# Patient Record
Sex: Female | Born: 1940
Health system: Southern US, Community
[De-identification: ages and names within clinical notes are randomized; demographics above are authoritative.]

## PROBLEM LIST (undated history)

## (undated) DIAGNOSIS — Z9889 Other specified postprocedural states: Secondary | ICD-10-CM

## (undated) DIAGNOSIS — K219 Gastro-esophageal reflux disease without esophagitis: Secondary | ICD-10-CM

## (undated) DIAGNOSIS — I5030 Unspecified diastolic (congestive) heart failure: Secondary | ICD-10-CM

## (undated) DIAGNOSIS — I639 Cerebral infarction, unspecified: Secondary | ICD-10-CM

## (undated) DIAGNOSIS — R1310 Dysphagia, unspecified: Secondary | ICD-10-CM

## (undated) DIAGNOSIS — R519 Headache, unspecified: Secondary | ICD-10-CM

## (undated) DIAGNOSIS — G8929 Other chronic pain: Secondary | ICD-10-CM

## (undated) DIAGNOSIS — Z91041 Radiographic dye allergy status: Secondary | ICD-10-CM

## (undated) DIAGNOSIS — I2699 Other pulmonary embolism without acute cor pulmonale: Secondary | ICD-10-CM

## (undated) DIAGNOSIS — K449 Diaphragmatic hernia without obstruction or gangrene: Secondary | ICD-10-CM

## (undated) DIAGNOSIS — R51 Headache: Secondary | ICD-10-CM

## (undated) DIAGNOSIS — I509 Heart failure, unspecified: Secondary | ICD-10-CM

## (undated) DIAGNOSIS — I2510 Atherosclerotic heart disease of native coronary artery without angina pectoris: Secondary | ICD-10-CM

## (undated) DIAGNOSIS — I350 Nonrheumatic aortic (valve) stenosis: Secondary | ICD-10-CM

## (undated) DIAGNOSIS — Z8639 Personal history of other endocrine, nutritional and metabolic disease: Secondary | ICD-10-CM

## (undated) DIAGNOSIS — R112 Nausea with vomiting, unspecified: Secondary | ICD-10-CM

## (undated) DIAGNOSIS — I10 Essential (primary) hypertension: Secondary | ICD-10-CM

## (undated) DIAGNOSIS — A048 Other specified bacterial intestinal infections: Secondary | ICD-10-CM

## (undated) DIAGNOSIS — I480 Paroxysmal atrial fibrillation: Secondary | ICD-10-CM

## (undated) DIAGNOSIS — D649 Anemia, unspecified: Secondary | ICD-10-CM

## (undated) DIAGNOSIS — M1990 Unspecified osteoarthritis, unspecified site: Secondary | ICD-10-CM

## (undated) DIAGNOSIS — R209 Unspecified disturbances of skin sensation: Secondary | ICD-10-CM

## (undated) DIAGNOSIS — K5790 Diverticulosis of intestine, part unspecified, without perforation or abscess without bleeding: Secondary | ICD-10-CM

## (undated) DIAGNOSIS — R202 Paresthesia of skin: Secondary | ICD-10-CM

## (undated) DIAGNOSIS — C159 Malignant neoplasm of esophagus, unspecified: Secondary | ICD-10-CM

## (undated) DIAGNOSIS — J90 Pleural effusion, not elsewhere classified: Secondary | ICD-10-CM

## (undated) DIAGNOSIS — IMO0002 Reserved for concepts with insufficient information to code with codable children: Secondary | ICD-10-CM

## (undated) HISTORY — DX: Pleural effusion, not elsewhere classified: J90

## (undated) HISTORY — PX: CHOLECYSTECTOMY: SHX55

## (undated) HISTORY — PX: BACK SURGERY: SHX140

## (undated) HISTORY — DX: Dysphagia, unspecified: R13.10

## (undated) HISTORY — DX: Other specified bacterial intestinal infections: A04.8

## (undated) HISTORY — PX: TONSILLECTOMY AND ADENOIDECTOMY: SHX28

## (undated) HISTORY — PX: APPENDECTOMY: SHX54

## (undated) HISTORY — DX: Diverticulosis of intestine, part unspecified, without perforation or abscess without bleeding: K57.90

## (undated) HISTORY — PX: OTHER SURGICAL HISTORY: SHX169

## (undated) HISTORY — PX: CARPAL TUNNEL RELEASE: SHX101

## (undated) HISTORY — PX: TONSILLECTOMY: SUR1361

---

## 1980-07-03 DIAGNOSIS — I639 Cerebral infarction, unspecified: Secondary | ICD-10-CM

## 1980-07-03 HISTORY — DX: Cerebral infarction, unspecified: I63.9

## 1989-07-03 HISTORY — PX: LUMBAR DISC SURGERY: SHX700

## 1998-07-30 ENCOUNTER — Other Ambulatory Visit: Admission: RE | Admit: 1998-07-30 | Discharge: 1998-07-30 | Payer: Self-pay | Admitting: Obstetrics and Gynecology

## 1998-10-12 ENCOUNTER — Encounter: Payer: Self-pay | Admitting: Family Medicine

## 1998-10-12 ENCOUNTER — Ambulatory Visit (HOSPITAL_COMMUNITY): Admission: RE | Admit: 1998-10-12 | Discharge: 1998-10-12 | Payer: Self-pay | Admitting: Family Medicine

## 1999-11-04 ENCOUNTER — Emergency Department (HOSPITAL_COMMUNITY): Admission: EM | Admit: 1999-11-04 | Discharge: 1999-11-04 | Payer: Self-pay | Admitting: Emergency Medicine

## 2000-08-28 ENCOUNTER — Other Ambulatory Visit: Admission: RE | Admit: 2000-08-28 | Discharge: 2000-08-28 | Payer: Self-pay | Admitting: Family Medicine

## 2000-10-12 ENCOUNTER — Ambulatory Visit (HOSPITAL_COMMUNITY): Admission: RE | Admit: 2000-10-12 | Discharge: 2000-10-12 | Payer: Self-pay | Admitting: Family Medicine

## 2000-10-12 ENCOUNTER — Encounter: Payer: Self-pay | Admitting: Family Medicine

## 2001-09-19 ENCOUNTER — Encounter: Payer: Self-pay | Admitting: Family Medicine

## 2001-09-19 ENCOUNTER — Ambulatory Visit (HOSPITAL_COMMUNITY): Admission: RE | Admit: 2001-09-19 | Discharge: 2001-09-19 | Payer: Self-pay | Admitting: Family Medicine

## 2002-07-29 ENCOUNTER — Ambulatory Visit (HOSPITAL_COMMUNITY): Admission: RE | Admit: 2002-07-29 | Discharge: 2002-07-29 | Payer: Self-pay | Admitting: Family Medicine

## 2002-07-29 ENCOUNTER — Encounter: Payer: Self-pay | Admitting: Family Medicine

## 2002-08-06 ENCOUNTER — Ambulatory Visit (HOSPITAL_COMMUNITY): Admission: RE | Admit: 2002-08-06 | Discharge: 2002-08-06 | Payer: Self-pay | Admitting: Family Medicine

## 2002-08-06 ENCOUNTER — Encounter: Payer: Self-pay | Admitting: Family Medicine

## 2002-08-13 ENCOUNTER — Inpatient Hospital Stay (HOSPITAL_COMMUNITY): Admission: AD | Admit: 2002-08-13 | Discharge: 2002-08-20 | Payer: Self-pay | Admitting: Internal Medicine

## 2003-06-08 ENCOUNTER — Other Ambulatory Visit: Admission: RE | Admit: 2003-06-08 | Discharge: 2003-06-08 | Payer: Self-pay | Admitting: Family Medicine

## 2003-07-04 DIAGNOSIS — D649 Anemia, unspecified: Secondary | ICD-10-CM

## 2003-07-04 HISTORY — DX: Anemia, unspecified: D64.9

## 2003-10-17 ENCOUNTER — Inpatient Hospital Stay (HOSPITAL_COMMUNITY): Admission: EM | Admit: 2003-10-17 | Discharge: 2003-10-21 | Payer: Self-pay | Admitting: Emergency Medicine

## 2004-09-12 ENCOUNTER — Inpatient Hospital Stay (HOSPITAL_COMMUNITY): Admission: EM | Admit: 2004-09-12 | Discharge: 2004-09-14 | Payer: Self-pay | Admitting: Internal Medicine

## 2004-11-18 ENCOUNTER — Ambulatory Visit: Admission: RE | Admit: 2004-11-18 | Discharge: 2004-11-18 | Payer: Self-pay | Admitting: Family Medicine

## 2005-05-27 ENCOUNTER — Encounter: Admission: RE | Admit: 2005-05-27 | Discharge: 2005-05-27 | Payer: Self-pay | Admitting: Family Medicine

## 2005-11-29 ENCOUNTER — Inpatient Hospital Stay (HOSPITAL_COMMUNITY): Admission: EM | Admit: 2005-11-29 | Discharge: 2005-12-01 | Payer: Self-pay | Admitting: Emergency Medicine

## 2005-12-01 ENCOUNTER — Encounter: Payer: Self-pay | Admitting: *Deleted

## 2006-07-03 DIAGNOSIS — K449 Diaphragmatic hernia without obstruction or gangrene: Secondary | ICD-10-CM

## 2006-07-03 HISTORY — DX: Diaphragmatic hernia without obstruction or gangrene: K44.9

## 2006-10-02 ENCOUNTER — Emergency Department (HOSPITAL_COMMUNITY): Admission: EM | Admit: 2006-10-02 | Discharge: 2006-10-02 | Payer: Self-pay | Admitting: Emergency Medicine

## 2006-11-01 ENCOUNTER — Other Ambulatory Visit: Admission: RE | Admit: 2006-11-01 | Discharge: 2006-11-01 | Payer: Self-pay | Admitting: Family Medicine

## 2006-11-30 ENCOUNTER — Inpatient Hospital Stay (HOSPITAL_COMMUNITY): Admission: EM | Admit: 2006-11-30 | Discharge: 2006-12-06 | Payer: Self-pay | Admitting: Emergency Medicine

## 2007-09-21 ENCOUNTER — Emergency Department (HOSPITAL_COMMUNITY): Admission: EM | Admit: 2007-09-21 | Discharge: 2007-09-21 | Payer: Self-pay | Admitting: Emergency Medicine

## 2009-10-12 ENCOUNTER — Encounter (INDEPENDENT_AMBULATORY_CARE_PROVIDER_SITE_OTHER): Payer: Self-pay | Admitting: Internal Medicine

## 2010-05-13 ENCOUNTER — Ambulatory Visit: Payer: Self-pay | Admitting: Pulmonary Disease

## 2010-05-13 ENCOUNTER — Encounter: Admission: RE | Admit: 2010-05-13 | Discharge: 2010-05-13 | Payer: Self-pay | Admitting: Family Medicine

## 2010-05-14 ENCOUNTER — Inpatient Hospital Stay (HOSPITAL_COMMUNITY): Admission: RE | Admit: 2010-05-14 | Discharge: 2010-05-18 | Payer: Self-pay | Admitting: Family Medicine

## 2010-05-16 ENCOUNTER — Encounter (INDEPENDENT_AMBULATORY_CARE_PROVIDER_SITE_OTHER): Payer: Self-pay | Admitting: Internal Medicine

## 2010-05-17 ENCOUNTER — Encounter (INDEPENDENT_AMBULATORY_CARE_PROVIDER_SITE_OTHER): Payer: Self-pay | Admitting: Cardiology

## 2010-06-09 ENCOUNTER — Inpatient Hospital Stay (HOSPITAL_COMMUNITY): Admission: EM | Admit: 2010-06-09 | Discharge: 2009-10-16 | Payer: Self-pay | Admitting: Emergency Medicine

## 2010-07-03 HISTORY — PX: OTHER SURGICAL HISTORY: SHX169

## 2010-07-23 ENCOUNTER — Encounter: Payer: Self-pay | Admitting: Family Medicine

## 2010-09-12 ENCOUNTER — Other Ambulatory Visit: Payer: Self-pay | Admitting: Family Medicine

## 2010-09-12 ENCOUNTER — Ambulatory Visit
Admission: RE | Admit: 2010-09-12 | Discharge: 2010-09-12 | Disposition: A | Discharge status: Home / Self Care | Payer: Medicare Other | Source: Ambulatory Visit | Attending: Family Medicine | Admitting: Family Medicine

## 2010-09-12 DIAGNOSIS — M545 Low back pain, unspecified: Secondary | ICD-10-CM

## 2010-09-12 DIAGNOSIS — M79604 Pain in right leg: Secondary | ICD-10-CM

## 2010-09-13 LAB — CBC
HCT: 22.6 % — ABNORMAL LOW (ref 36.0–46.0)
HCT: 24.6 % — ABNORMAL LOW (ref 36.0–46.0)
HCT: 31.2 % — ABNORMAL LOW (ref 36.0–46.0)
Hemoglobin: 10.2 g/dL — ABNORMAL LOW (ref 12.0–15.0)
Hemoglobin: 7.4 g/dL — ABNORMAL LOW (ref 12.0–15.0)
Hemoglobin: 8.1 g/dL — ABNORMAL LOW (ref 12.0–15.0)
Hemoglobin: 8.5 g/dL — ABNORMAL LOW (ref 12.0–15.0)
Hemoglobin: 8.6 g/dL — ABNORMAL LOW (ref 12.0–15.0)
MCH: 25.2 pg — ABNORMAL LOW (ref 26.0–34.0)
MCH: 25.3 pg — ABNORMAL LOW (ref 26.0–34.0)
MCH: 25.4 pg — ABNORMAL LOW (ref 26.0–34.0)
MCH: 25.6 pg — ABNORMAL LOW (ref 26.0–34.0)
MCHC: 32.6 g/dL (ref 30.0–36.0)
MCHC: 32.8 g/dL (ref 30.0–36.0)
MCHC: 32.8 g/dL (ref 30.0–36.0)
MCHC: 33.2 g/dL (ref 30.0–36.0)
MCV: 76.2 fL — ABNORMAL LOW (ref 78.0–100.0)
Platelets: 166 10*3/uL (ref 150–400)
Platelets: 174 10*3/uL (ref 150–400)
RBC: 3.06 MIL/uL — ABNORMAL LOW (ref 3.87–5.11)
RBC: 3.21 MIL/uL — ABNORMAL LOW (ref 3.87–5.11)
RBC: 4 MIL/uL (ref 3.87–5.11)
RDW: 16.5 % — ABNORMAL HIGH (ref 11.5–15.5)
WBC: 4.2 10*3/uL (ref 4.0–10.5)
WBC: 8.5 10*3/uL (ref 4.0–10.5)

## 2010-09-13 LAB — BASIC METABOLIC PANEL
BUN: 12 mg/dL (ref 6–23)
CO2: 26 mEq/L (ref 19–32)
CO2: 30 mEq/L (ref 19–32)
Calcium: 8.2 mg/dL — ABNORMAL LOW (ref 8.4–10.5)
Calcium: 8.3 mg/dL — ABNORMAL LOW (ref 8.4–10.5)
Calcium: 8.7 mg/dL (ref 8.4–10.5)
Calcium: 8.9 mg/dL (ref 8.4–10.5)
Calcium: 9.1 mg/dL (ref 8.4–10.5)
Chloride: 107 mEq/L (ref 96–112)
Creatinine, Ser: 0.96 mg/dL (ref 0.4–1.2)
Creatinine, Ser: 1.04 mg/dL (ref 0.4–1.2)
Creatinine, Ser: 1.1 mg/dL (ref 0.4–1.2)
GFR calc Af Amer: 45 mL/min — ABNORMAL LOW (ref >60)
GFR calc Af Amer: 59 mL/min — ABNORMAL LOW (ref >60)
GFR calc Af Amer: >60 mL/min (ref >60)
GFR calc non Af Amer: 49 mL/min — ABNORMAL LOW (ref >60)
GFR calc non Af Amer: 52 mL/min — ABNORMAL LOW (ref >60)
GFR calc non Af Amer: 59 mL/min — ABNORMAL LOW (ref >60)
Glucose, Bld: 108 mg/dL — ABNORMAL HIGH (ref 70–99)
Glucose, Bld: 140 mg/dL — ABNORMAL HIGH (ref 70–99)
Glucose, Bld: 151 mg/dL — ABNORMAL HIGH (ref 70–99)
Glucose, Bld: 171 mg/dL — ABNORMAL HIGH (ref 70–99)
Potassium: 3.6 mEq/L (ref 3.5–5.1)
Sodium: 140 mEq/L (ref 135–145)
Sodium: 141 mEq/L (ref 135–145)
Sodium: 142 mEq/L (ref 135–145)

## 2010-09-13 LAB — D-DIMER, QUANTITATIVE
D-Dimer, Quant: 0.24 ug/mL-FEU (ref 0.00–0.48)
D-Dimer, Quant: 0.32 ug/mL-FEU (ref 0.00–0.48)

## 2010-09-13 LAB — CULTURE, BLOOD (ROUTINE X 2)
Culture  Setup Time: 201111142310
Culture: NO GROWTH

## 2010-09-13 LAB — BLOOD GAS, ARTERIAL
O2 Content: 2 L/min
O2 Saturation: 99.4 %
Patient temperature: 98.6
pH, Arterial: 7.416 — ABNORMAL HIGH (ref 7.350–7.400)
pO2, Arterial: 140 mmHg — ABNORMAL HIGH (ref 80.0–100.0)

## 2010-09-13 LAB — CARDIAC PANEL(CRET KIN+CKTOT+MB+TROPI)
CK, MB: 1.6 ng/mL (ref 0.3–4.0)
CK, MB: 1.7 ng/mL (ref 0.3–4.0)
Relative Index: INVALID (ref 0.0–2.5)
Total CK: 42 U/L (ref 7–177)
Total CK: 71 U/L (ref 7–177)
Troponin I: 0.01 ng/mL (ref 0.00–0.06)
Troponin I: 0.02 ng/mL (ref 0.00–0.06)
Troponin I: 0.02 ng/mL (ref 0.00–0.06)

## 2010-09-13 LAB — POCT CARDIAC MARKERS
CKMB, poc: <1 ng/mL — ABNORMAL LOW (ref 1.0–8.0)
Myoglobin, poc: 61.3 ng/mL (ref 12–200)
Troponin i, poc: <0.05 ng/mL (ref 0.00–0.09)

## 2010-09-13 LAB — CROSSMATCH
ABO/RH(D): A POS
Antibody Screen: NEGATIVE
Unit division: 0
Unit division: 0

## 2010-09-13 LAB — PROTIME-INR
INR: 2 — ABNORMAL HIGH (ref 0.00–1.49)
INR: 2.01 — ABNORMAL HIGH (ref 0.00–1.49)
Prothrombin Time: 22.8 seconds — ABNORMAL HIGH (ref 11.6–15.2)
Prothrombin Time: 22.9 seconds — ABNORMAL HIGH (ref 11.6–15.2)

## 2010-09-13 LAB — COMPREHENSIVE METABOLIC PANEL
ALT: 15 U/L (ref 0–35)
Alkaline Phosphatase: 56 U/L (ref 39–117)
Chloride: 107 mEq/L (ref 96–112)
Glucose, Bld: 122 mg/dL — ABNORMAL HIGH (ref 70–99)
Potassium: 3.8 mEq/L (ref 3.5–5.1)
Sodium: 141 mEq/L (ref 135–145)
Total Bilirubin: 0.3 mg/dL (ref 0.3–1.2)
Total Protein: 5.8 g/dL — ABNORMAL LOW (ref 6.0–8.3)

## 2010-09-13 LAB — APTT: aPTT: 33 seconds (ref 24–37)

## 2010-09-13 LAB — URINE CULTURE
Colony Count: 4000
Culture  Setup Time: 201111121133

## 2010-09-13 LAB — DIFFERENTIAL
Basophils Absolute: 0 10*3/uL (ref 0.0–0.1)
Eosinophils Absolute: 0.1 10*3/uL (ref 0.0–0.7)
Eosinophils Relative: 2 % (ref 0–5)
Lymphocytes Relative: 31 % (ref 12–46)
Lymphs Abs: 1.3 10*3/uL (ref 0.7–4.0)
Neutrophils Relative %: 56 % (ref 43–77)

## 2010-09-13 LAB — HEMOCCULT GUIAC POC 1CARD (OFFICE)
Fecal Occult Bld: NEGATIVE
Fecal Occult Bld: NEGATIVE

## 2010-09-13 LAB — BRAIN NATRIURETIC PEPTIDE
Pro B Natriuretic peptide (BNP): 104 pg/mL — ABNORMAL HIGH (ref 0.0–100.0)
Pro B Natriuretic peptide (BNP): 408 pg/mL — ABNORMAL HIGH (ref 0.0–100.0)

## 2010-09-13 LAB — MAGNESIUM: Magnesium: 2.2 mg/dL (ref 1.5–2.5)

## 2010-09-13 LAB — HEMOGLOBIN AND HEMATOCRIT, BLOOD
HCT: 27 % — ABNORMAL LOW (ref 36.0–46.0)
Hemoglobin: 8.2 g/dL — ABNORMAL LOW (ref 12.0–15.0)

## 2010-09-13 LAB — URINE MICROSCOPIC-ADD ON

## 2010-09-13 LAB — TROPONIN I: Troponin I: 0.02 ng/mL (ref 0.00–0.06)

## 2010-09-13 LAB — RETICULOCYTES
RBC.: 3.52 MIL/uL — ABNORMAL LOW (ref 3.87–5.11)
Retic Count, Absolute: 73.9 10*3/uL (ref 19.0–186.0)

## 2010-09-13 LAB — ABO/RH: ABO/RH(D): A POS

## 2010-09-13 LAB — LEGIONELLA ANTIGEN, URINE: Legionella Antigen, Urine: NEGATIVE

## 2010-09-13 LAB — URINALYSIS, ROUTINE W REFLEX MICROSCOPIC
Bilirubin Urine: NEGATIVE
Ketones, ur: NEGATIVE mg/dL
Nitrite: NEGATIVE
Protein, ur: NEGATIVE mg/dL
Urobilinogen, UA: 1 mg/dL (ref 0.0–1.0)

## 2010-09-13 LAB — LIPID PANEL: HDL: 45 mg/dL (ref >39)

## 2010-09-13 LAB — CK TOTAL AND CKMB (NOT AT ARMC)
CK, MB: 1.7 ng/mL (ref 0.3–4.0)
Total CK: 76 U/L (ref 7–177)

## 2010-09-13 LAB — IRON AND TIBC: Iron: 40 ug/dL — ABNORMAL LOW (ref 42–135)

## 2010-09-13 LAB — FERRITIN: Ferritin: 31 ng/mL (ref 10–291)

## 2010-09-20 LAB — CBC
HCT: 29 % — ABNORMAL LOW (ref 36.0–46.0)
Hemoglobin: 10.2 g/dL — ABNORMAL LOW (ref 12.0–15.0)
Platelets: 128 10*3/uL — ABNORMAL LOW (ref 150–400)
RBC: 3.23 MIL/uL — ABNORMAL LOW (ref 3.87–5.11)
RDW: 13.5 % (ref 11.5–15.5)
WBC: 5.4 10*3/uL (ref 4.0–10.5)

## 2010-09-20 LAB — PROTIME-INR
INR: 2.14 — ABNORMAL HIGH (ref 0.00–1.49)
INR: 2.5 — ABNORMAL HIGH (ref 0.00–1.49)
Prothrombin Time: 23.7 seconds — ABNORMAL HIGH (ref 11.6–15.2)

## 2010-09-20 LAB — BASIC METABOLIC PANEL
CO2: 31 mEq/L (ref 19–32)
Calcium: 8.6 mg/dL (ref 8.4–10.5)
GFR calc Af Amer: >60 mL/min (ref >60)
GFR calc non Af Amer: >60 mL/min (ref >60)
Potassium: 4.1 mEq/L (ref 3.5–5.1)
Sodium: 144 mEq/L (ref 135–145)

## 2010-09-20 LAB — H. PYLORI ANTIBODY, IGG: H Pylori IgG: <0.4 {ISR}

## 2010-09-20 LAB — HEMOCCULT GUIAC POC 1CARD (OFFICE): Fecal Occult Bld: POSITIVE

## 2010-09-21 LAB — COMPREHENSIVE METABOLIC PANEL
ALT: 16 U/L (ref 0–35)
AST: 18 U/L (ref 0–37)
Alkaline Phosphatase: 53 U/L (ref 39–117)
CO2: 30 mEq/L (ref 19–32)
Calcium: 8.9 mg/dL (ref 8.4–10.5)
Chloride: 105 mEq/L (ref 96–112)
GFR calc Af Amer: >60 mL/min (ref >60)
GFR calc non Af Amer: 54 mL/min — ABNORMAL LOW (ref >60)
Potassium: 3.6 mEq/L (ref 3.5–5.1)
Sodium: 140 mEq/L (ref 135–145)
Total Bilirubin: 0.7 mg/dL (ref 0.3–1.2)

## 2010-09-21 LAB — CBC
HCT: 27.3 % — ABNORMAL LOW (ref 36.0–46.0)
HCT: 30.6 % — ABNORMAL LOW (ref 36.0–46.0)
Hemoglobin: 10.6 g/dL — ABNORMAL LOW (ref 12.0–15.0)
Hemoglobin: 12.9 g/dL (ref 12.0–15.0)
MCHC: 34.7 g/dL (ref 30.0–36.0)
MCV: 89.3 fL (ref 78.0–100.0)
RBC: 3.06 MIL/uL — ABNORMAL LOW (ref 3.87–5.11)
RBC: 3.4 MIL/uL — ABNORMAL LOW (ref 3.87–5.11)
RBC: 4.13 MIL/uL (ref 3.87–5.11)
WBC: 4.1 10*3/uL (ref 4.0–10.5)
WBC: 7.9 10*3/uL (ref 4.0–10.5)

## 2010-09-21 LAB — TROPONIN I
Troponin I: 0.04 ng/mL (ref 0.00–0.06)
Troponin I: 0.05 ng/mL (ref 0.00–0.06)

## 2010-09-21 LAB — PROTIME-INR
INR: 1.86 — ABNORMAL HIGH (ref 0.00–1.49)
Prothrombin Time: 21.3 seconds — ABNORMAL HIGH (ref 11.6–15.2)

## 2010-09-21 LAB — LIPID PANEL
Triglycerides: 92 mg/dL (ref <150)
VLDL: 18 mg/dL (ref 0–40)

## 2010-09-21 LAB — POCT CARDIAC MARKERS
CKMB, poc: 2 ng/mL (ref 1.0–8.0)
Troponin i, poc: <0.05 ng/mL (ref 0.00–0.09)
Troponin i, poc: <0.05 ng/mL (ref 0.00–0.09)

## 2010-09-21 LAB — DIFFERENTIAL
Basophils Relative: 0 % (ref 0–1)
Eosinophils Absolute: 0.3 10*3/uL (ref 0.0–0.7)
Eosinophils Relative: 4 % (ref 0–5)
Lymphs Abs: 2.6 10*3/uL (ref 0.7–4.0)

## 2010-09-21 LAB — CK TOTAL AND CKMB (NOT AT ARMC)
CK, MB: 2 ng/mL (ref 0.3–4.0)
Relative Index: INVALID (ref 0.0–2.5)
Relative Index: INVALID (ref 0.0–2.5)
Total CK: 51 U/L (ref 7–177)

## 2010-09-21 LAB — TSH: TSH: 2.971 u[IU]/mL (ref 0.350–4.500)

## 2010-10-03 ENCOUNTER — Other Ambulatory Visit: Payer: Self-pay | Admitting: Orthopedic Surgery

## 2010-10-03 DIAGNOSIS — M549 Dorsalgia, unspecified: Secondary | ICD-10-CM

## 2010-10-04 ENCOUNTER — Ambulatory Visit
Admission: RE | Admit: 2010-10-04 | Discharge: 2010-10-04 | Disposition: A | Discharge status: Home / Self Care | Payer: Medicare Other | Source: Ambulatory Visit | Attending: Orthopedic Surgery | Admitting: Orthopedic Surgery

## 2010-10-04 DIAGNOSIS — M549 Dorsalgia, unspecified: Secondary | ICD-10-CM

## 2010-10-07 ENCOUNTER — Other Ambulatory Visit (HOSPITAL_COMMUNITY): Payer: Medicare Other

## 2010-10-10 ENCOUNTER — Ambulatory Visit (HOSPITAL_COMMUNITY)
Admission: RE | Admit: 2010-10-10 | Discharge: 2010-10-10 | Disposition: A | Discharge status: Home / Self Care | Payer: Medicare Other | Source: Ambulatory Visit | Attending: Orthopedic Surgery | Admitting: Orthopedic Surgery

## 2010-10-10 ENCOUNTER — Other Ambulatory Visit (HOSPITAL_COMMUNITY): Payer: Self-pay | Admitting: Orthopedic Surgery

## 2010-10-10 ENCOUNTER — Encounter (HOSPITAL_COMMUNITY)
Admission: RE | Admit: 2010-10-10 | Discharge: 2010-10-10 | Disposition: A | Discharge status: Home / Self Care | Payer: Medicare Other | Source: Ambulatory Visit | Attending: Orthopedic Surgery | Admitting: Orthopedic Surgery

## 2010-10-10 DIAGNOSIS — Z01812 Encounter for preprocedural laboratory examination: Secondary | ICD-10-CM | POA: Insufficient documentation

## 2010-10-10 DIAGNOSIS — Z01818 Encounter for other preprocedural examination: Secondary | ICD-10-CM | POA: Insufficient documentation

## 2010-10-10 DIAGNOSIS — I10 Essential (primary) hypertension: Secondary | ICD-10-CM | POA: Insufficient documentation

## 2010-10-10 DIAGNOSIS — R059 Cough, unspecified: Secondary | ICD-10-CM | POA: Insufficient documentation

## 2010-10-10 DIAGNOSIS — M5126 Other intervertebral disc displacement, lumbar region: Secondary | ICD-10-CM | POA: Insufficient documentation

## 2010-10-10 DIAGNOSIS — M4120 Other idiopathic scoliosis, site unspecified: Secondary | ICD-10-CM | POA: Insufficient documentation

## 2010-10-10 DIAGNOSIS — R05 Cough: Secondary | ICD-10-CM | POA: Insufficient documentation

## 2010-10-10 DIAGNOSIS — K449 Diaphragmatic hernia without obstruction or gangrene: Secondary | ICD-10-CM | POA: Insufficient documentation

## 2010-10-10 DIAGNOSIS — Z01810 Encounter for preprocedural cardiovascular examination: Secondary | ICD-10-CM | POA: Insufficient documentation

## 2010-10-10 LAB — DIFFERENTIAL
Lymphocytes Relative: 28 % (ref 12–46)
Monocytes Absolute: 0.6 10*3/uL (ref 0.1–1.0)
Monocytes Relative: 9 % (ref 3–12)
Neutro Abs: 4.4 10*3/uL (ref 1.7–7.7)

## 2010-10-10 LAB — CBC
HCT: 27.9 % — ABNORMAL LOW (ref 36.0–46.0)
Hemoglobin: 8.8 g/dL — ABNORMAL LOW (ref 12.0–15.0)
MCH: 25.1 pg — ABNORMAL LOW (ref 26.0–34.0)
MCHC: 31.5 g/dL (ref 30.0–36.0)
MCV: 79.7 fL (ref 78.0–100.0)

## 2010-10-10 LAB — URINE MICROSCOPIC-ADD ON

## 2010-10-10 LAB — URINALYSIS, ROUTINE W REFLEX MICROSCOPIC
Glucose, UA: 100 mg/dL — AB
Specific Gravity, Urine: 1.025 (ref 1.005–1.030)
Urobilinogen, UA: 1 mg/dL (ref 0.0–1.0)

## 2010-10-10 LAB — COMPREHENSIVE METABOLIC PANEL
AST: 18 U/L (ref 0–37)
CO2: 26 mEq/L (ref 19–32)
Calcium: 8.7 mg/dL (ref 8.4–10.5)
Creatinine, Ser: 0.86 mg/dL (ref 0.4–1.2)
GFR calc Af Amer: >60 mL/min (ref >60)
GFR calc non Af Amer: >60 mL/min (ref >60)

## 2010-10-10 LAB — SURGICAL PCR SCREEN
MRSA, PCR: NEGATIVE
Staphylococcus aureus: NEGATIVE

## 2010-10-10 LAB — PROTIME-INR: INR: 1.08 (ref 0.00–1.49)

## 2010-10-11 ENCOUNTER — Inpatient Hospital Stay (HOSPITAL_COMMUNITY): Payer: Medicare Other

## 2010-10-11 ENCOUNTER — Inpatient Hospital Stay (HOSPITAL_COMMUNITY)
Admission: RE | Admit: 2010-10-11 | Discharge: 2010-10-21 | DRG: 459 | Disposition: A | Discharge status: Skilled Nursing Facility | Payer: Medicare Other | Source: Ambulatory Visit | Attending: Orthopedic Surgery | Admitting: Orthopedic Surgery

## 2010-10-11 ENCOUNTER — Other Ambulatory Visit: Payer: Self-pay | Admitting: Orthopedic Surgery

## 2010-10-11 ENCOUNTER — Inpatient Hospital Stay (HOSPITAL_COMMUNITY): Admission: RE | Admit: 2010-10-11 | Payer: Medicare Other | Source: Ambulatory Visit | Admitting: Orthopedic Surgery

## 2010-10-11 DIAGNOSIS — E876 Hypokalemia: Secondary | ICD-10-CM | POA: Diagnosis present

## 2010-10-11 DIAGNOSIS — Z7901 Long term (current) use of anticoagulants: Secondary | ICD-10-CM

## 2010-10-11 DIAGNOSIS — E669 Obesity, unspecified: Secondary | ICD-10-CM | POA: Diagnosis present

## 2010-10-11 DIAGNOSIS — I5032 Chronic diastolic (congestive) heart failure: Secondary | ICD-10-CM | POA: Diagnosis present

## 2010-10-11 DIAGNOSIS — Z823 Family history of stroke: Secondary | ICD-10-CM

## 2010-10-11 DIAGNOSIS — Z79899 Other long term (current) drug therapy: Secondary | ICD-10-CM

## 2010-10-11 DIAGNOSIS — K219 Gastro-esophageal reflux disease without esophagitis: Secondary | ICD-10-CM | POA: Diagnosis present

## 2010-10-11 DIAGNOSIS — Z01812 Encounter for preprocedural laboratory examination: Secondary | ICD-10-CM

## 2010-10-11 DIAGNOSIS — Z01818 Encounter for other preprocedural examination: Secondary | ICD-10-CM

## 2010-10-11 DIAGNOSIS — I10 Essential (primary) hypertension: Secondary | ICD-10-CM | POA: Diagnosis present

## 2010-10-11 DIAGNOSIS — Z8249 Family history of ischemic heart disease and other diseases of the circulatory system: Secondary | ICD-10-CM

## 2010-10-11 DIAGNOSIS — I509 Heart failure, unspecified: Secondary | ICD-10-CM | POA: Diagnosis present

## 2010-10-11 DIAGNOSIS — K449 Diaphragmatic hernia without obstruction or gangrene: Secondary | ICD-10-CM | POA: Diagnosis present

## 2010-10-11 DIAGNOSIS — M7130 Other bursal cyst, unspecified site: Secondary | ICD-10-CM | POA: Diagnosis present

## 2010-10-11 DIAGNOSIS — Z86711 Personal history of pulmonary embolism: Secondary | ICD-10-CM

## 2010-10-11 DIAGNOSIS — I4891 Unspecified atrial fibrillation: Secondary | ICD-10-CM | POA: Diagnosis present

## 2010-10-11 DIAGNOSIS — R195 Other fecal abnormalities: Secondary | ICD-10-CM | POA: Diagnosis present

## 2010-10-11 DIAGNOSIS — J690 Pneumonitis due to inhalation of food and vomit: Secondary | ICD-10-CM | POA: Diagnosis not present

## 2010-10-11 DIAGNOSIS — M5126 Other intervertebral disc displacement, lumbar region: Principal | ICD-10-CM | POA: Diagnosis present

## 2010-10-11 DIAGNOSIS — E785 Hyperlipidemia, unspecified: Secondary | ICD-10-CM | POA: Diagnosis present

## 2010-10-11 DIAGNOSIS — Z01810 Encounter for preprocedural cardiovascular examination: Secondary | ICD-10-CM

## 2010-10-11 DIAGNOSIS — D6489 Other specified anemias: Secondary | ICD-10-CM | POA: Diagnosis present

## 2010-10-11 HISTORY — DX: Essential (primary) hypertension: I10

## 2010-10-11 HISTORY — DX: Heart failure, unspecified: I50.9

## 2010-10-11 LAB — CBC
HCT: 31.2 % — ABNORMAL LOW (ref 36.0–46.0)
MCHC: 33.3 g/dL (ref 30.0–36.0)
Platelets: 145 10*3/uL — ABNORMAL LOW (ref 150–400)
RDW: 14.1 % (ref 11.5–15.5)
WBC: 4.9 10*3/uL (ref 4.0–10.5)

## 2010-10-11 LAB — VITAMIN D 25 HYDROXY (VIT D DEFICIENCY, FRACTURES): Vit D, 25-Hydroxy: 18 ng/mL — ABNORMAL LOW (ref 30–89)

## 2010-10-12 LAB — BASIC METABOLIC PANEL
BUN: 15 mg/dL (ref 6–23)
CO2: 25 mEq/L (ref 19–32)
Chloride: 110 mEq/L (ref 96–112)
Creatinine, Ser: 0.91 mg/dL (ref 0.4–1.2)
GFR calc Af Amer: >60 mL/min (ref >60)
Glucose, Bld: 141 mg/dL — ABNORMAL HIGH (ref 70–99)
Potassium: 4.2 mEq/L (ref 3.5–5.1)
Sodium: 141 mEq/L (ref 135–145)

## 2010-10-12 LAB — CBC
Hemoglobin: 10.1 g/dL — ABNORMAL LOW (ref 12.0–15.0)
Platelets: 120 10*3/uL — ABNORMAL LOW (ref 150–400)
RBC: 3.73 MIL/uL — ABNORMAL LOW (ref 3.87–5.11)
WBC: 7.3 10*3/uL (ref 4.0–10.5)

## 2010-10-12 LAB — PROTIME-INR
INR: 1.06 (ref 0.00–1.49)
Prothrombin Time: 14 seconds (ref 11.6–15.2)

## 2010-10-13 LAB — TYPE AND SCREEN
ABO/RH(D): A POS
Antibody Screen: NEGATIVE
Unit division: 0
Unit division: 0
Unit division: 0

## 2010-10-13 LAB — PROTIME-INR
INR: 1.18 (ref 0.00–1.49)
Prothrombin Time: 15.2 seconds (ref 11.6–15.2)

## 2010-10-13 LAB — CBC
MCV: 82.8 fL (ref 78.0–100.0)
Platelets: 129 10*3/uL — ABNORMAL LOW (ref 150–400)
RBC: 4.24 MIL/uL (ref 3.87–5.11)
RDW: 14.6 % (ref 11.5–15.5)
WBC: 10.1 10*3/uL (ref 4.0–10.5)

## 2010-10-13 LAB — BASIC METABOLIC PANEL
BUN: 14 mg/dL (ref 6–23)
GFR calc Af Amer: >60 mL/min (ref >60)
GFR calc non Af Amer: 53 mL/min — ABNORMAL LOW (ref >60)
Potassium: 3.5 mEq/L (ref 3.5–5.1)

## 2010-10-14 LAB — CBC
HCT: 32.6 % — ABNORMAL LOW (ref 36.0–46.0)
Platelets: 109 10*3/uL — ABNORMAL LOW (ref 150–400)
RBC: 3.95 MIL/uL (ref 3.87–5.11)
RDW: 14.4 % (ref 11.5–15.5)
WBC: 9.1 10*3/uL (ref 4.0–10.5)

## 2010-10-14 LAB — PROTIME-INR: INR: 1.26 (ref 0.00–1.49)

## 2010-10-14 LAB — BASIC METABOLIC PANEL
Calcium: 8 mg/dL — ABNORMAL LOW (ref 8.4–10.5)
Chloride: 101 mEq/L (ref 96–112)
Creatinine, Ser: 0.9 mg/dL (ref 0.4–1.2)
GFR calc Af Amer: >60 mL/min (ref >60)
Sodium: 136 mEq/L (ref 135–145)

## 2010-10-15 ENCOUNTER — Inpatient Hospital Stay (HOSPITAL_COMMUNITY): Payer: Medicare Other

## 2010-10-15 LAB — CBC
MCHC: 31.7 g/dL (ref 30.0–36.0)
Platelets: 111 10*3/uL — ABNORMAL LOW (ref 150–400)
RDW: 14.5 % (ref 11.5–15.5)

## 2010-10-15 LAB — BASIC METABOLIC PANEL
Calcium: 8.2 mg/dL — ABNORMAL LOW (ref 8.4–10.5)
GFR calc Af Amer: >60 mL/min (ref >60)
GFR calc non Af Amer: 58 mL/min — ABNORMAL LOW (ref >60)
Sodium: 137 mEq/L (ref 135–145)

## 2010-10-15 LAB — POCT I-STAT 4, (NA,K, GLUC, HGB,HCT)
Glucose, Bld: 128 mg/dL — ABNORMAL HIGH (ref 70–99)
Potassium: 4.4 mEq/L (ref 3.5–5.1)
Sodium: 139 mEq/L (ref 135–145)

## 2010-10-15 LAB — PROTIME-INR
INR: 1.38 (ref 0.00–1.49)
Prothrombin Time: 17.2 seconds — ABNORMAL HIGH (ref 11.6–15.2)

## 2010-10-16 LAB — CBC
HCT: 31.4 % — ABNORMAL LOW (ref 36.0–46.0)
MCHC: 33.1 g/dL (ref 30.0–36.0)
MCV: 81.8 fL (ref 78.0–100.0)
RDW: 14.5 % (ref 11.5–15.5)
WBC: 9.3 10*3/uL (ref 4.0–10.5)

## 2010-10-16 LAB — BASIC METABOLIC PANEL
CO2: 30 mEq/L (ref 19–32)
Chloride: 98 mEq/L (ref 96–112)
Creatinine, Ser: 0.75 mg/dL (ref 0.4–1.2)
GFR calc Af Amer: >60 mL/min (ref >60)

## 2010-10-17 ENCOUNTER — Inpatient Hospital Stay (HOSPITAL_COMMUNITY): Payer: Medicare Other

## 2010-10-17 LAB — BASIC METABOLIC PANEL
BUN: 13 mg/dL (ref 6–23)
CO2: 29 mEq/L (ref 19–32)
Chloride: 99 mEq/L (ref 96–112)
Glucose, Bld: 107 mg/dL — ABNORMAL HIGH (ref 70–99)
Potassium: 3.8 mEq/L (ref 3.5–5.1)

## 2010-10-17 LAB — CBC
HCT: 31.5 % — ABNORMAL LOW (ref 36.0–46.0)
MCV: 81.4 fL (ref 78.0–100.0)
RBC: 3.87 MIL/uL (ref 3.87–5.11)
RDW: 14.6 % (ref 11.5–15.5)
WBC: 7.4 10*3/uL (ref 4.0–10.5)

## 2010-10-17 LAB — DIFFERENTIAL
Eosinophils Relative: 2 % (ref 0–5)
Lymphocytes Relative: 15 % (ref 12–46)
Lymphs Abs: 1.1 10*3/uL (ref 0.7–4.0)

## 2010-10-17 LAB — PROTIME-INR: INR: 1.64 — ABNORMAL HIGH (ref 0.00–1.49)

## 2010-10-18 LAB — CBC
HCT: 32 % — ABNORMAL LOW (ref 36.0–46.0)
MCH: 27.3 pg (ref 26.0–34.0)
MCHC: 33.8 g/dL (ref 30.0–36.0)
MCV: 80.8 fL (ref 78.0–100.0)
RDW: 14.5 % (ref 11.5–15.5)

## 2010-10-18 LAB — BASIC METABOLIC PANEL
Calcium: 8.4 mg/dL (ref 8.4–10.5)
Creatinine, Ser: 0.94 mg/dL (ref 0.4–1.2)
GFR calc Af Amer: >60 mL/min (ref >60)
GFR calc non Af Amer: 59 mL/min — ABNORMAL LOW (ref >60)
Sodium: 135 mEq/L (ref 135–145)

## 2010-10-18 LAB — PROTIME-INR: INR: 2.27 — ABNORMAL HIGH (ref 0.00–1.49)

## 2010-10-18 NOTE — Op Note (Signed)
[name], [name]               ACCOUNT NO.:  [id number]  MEDICAL RECORD NO.:  [id number]           PATIENT TYPE:  I  LOCATION:  3302                         FACILITY:  [facility]  PHYSICIAN:  Nelda Severe, MD      DATE OF BIRTH:  [birth year]  DATE OF PROCEDURE:  10/11/2010 DATE OF DISCHARGE:                              OPERATIVE REPORT   SURGEON:  Nelda Severe, MD.  ASSISTANT:  [name], P.A.  PREOPERATIVE DIAGNOSES:  L3-4 foraminal stenosis with calcified synovial cyst, possible L3-4 disk herniation, L4-5 central and foraminal stenosis.  POSTOPERATIVE DIAGNOSES:  Loose body L3-4 neural foramen right side causing foraminal stenosis; no disk herniation identified; L4-5 central foraminal stenosis.  OPERATIVE PROCEDURE:  Laminar foraminotomy L3-4 right side, extrication of loose body; the L4-5 fusion with ILIS system including machined allograft for spinous process distraction, interspinous process plates, facet grafting with local bone graft and bioglass  OPERATIVE NOTE:  The patient was placed under general endotracheal anesthesia.  Sequential compression devices were placed in both lower extremities.  Antibiotics were infused for prophylaxis against infection.  Because of a low hemoglobin, the anesthesiology team recommended starting a unit of blood which was done.  In fact, a lengthy discussion was held with Dr. Jean Rosenthal, anesthesiologist, prior to the beginning of the surgery.  She was concerned of the patient's anemia, but we know that the anemia is chronic and the situation with regards to her was very severe and completely disabling right leg pain is dire.  I did not think that we could justify delaying a surgery which is likely to relieve her leg pain, so that she could have a workup of her anemia which has in fact already been addressed within the last year without any conclusive diagnosis.  She was positioned prone on the [place] frame.  Care was taken  to position the upper extremities so as to avoid hyperflexion and abduction of shoulders so as to avoid hyperflexion of the elbows.  The thighs, knees, shins and ankles were supported on pillows.  The lumbar area was prepped with DuraPrep and draped in rectangular fashion.  The drapes were secured with Ioban.  A time-out was held at which point the patient's identity, preoperative diagnosis, intended procedure, [name], [name] were all discussed.  Midline incision was made incorporating the previous short midline incision, which had been at L4-5.  Cross-table lateral radiograph was taken, once we had the paraspinal muscles mobilized with a Kocher on what proved to be the third lumbar spinous process.  Just to absolutely confirm level, having exposed more of the spine, a probe was placed between the spinous processes of L4 and L5, another cross-table lateral radiograph which confirmed position.  At L3-4 on the right side, I used a high-speed bur to perform a laminar foraminotomy.  I knew from the CT scan we done recently that there was what was said to be a calcified synovial cyst at the proximal tip of the superior articular process of L4 on the right side.  The medial edge of this superior articular process was undercut with a Kerrison rongeur from distal  to proximal.  Ultimately, I removed the ligamentum flavum from the dura more medially and some residual joint capsule laterally. I then used the ball-tipped nerve hook to palpate the L3-4 neural foramen, and actually to my surprise hooked out what appeared to be a fairly typical looking loose body with the cartilaginous surface on it. It was about the size of a small bean.  It measured approximately 1 cm in length and a picture was actually taken of it.  It had been submitted to pathology.  I further palpated distally in the canal and could not palpate a disk fragment either above or below the L3-4 disk space.  We then  placed some Gelfoam in the canal to control bleeding, which was removed later.  We then addressed the L4-5 interspinous area.  The supraspinous and interspinous ligament removed with a Leksell rongeur. The distracting device was placed and ultimately distraction between the two spinous process was carried out by placing a Caspar pin in the spinous process of L4 and a "shovel" on the superior aspect of the spinous process of L5.  We were then able to distract the spinous processes and a trial sizer was used to choose a 12-mm cortical allograft placed between the base of the spinous processes of L4-5. This was placed without difficulty.  I did use a high-speed bur to remove some of the articular surface of both right and left L4-5 apophyseal joints.  During the performance of the laminar foraminotomy at L3-4, we harvested a good deal of bone, which was then mixed with 5 mL of bioglass.  The ILIS interspinous clamp was then applied and compressed.  Bone graft was then placed in the facet joints and between the spinous processes. Cross-table lateral radiograph showed satisfactory position of correct level for placement of the implants.  We then closed the wound over a 15- gauge Blake drain, placed subfascially and brought out through the skin to the right side.  This thoracolumbar fascia was closed using interrupted #1 Vicryl sutures.  The subcutaneous layer was closed using interrupted inverted 2-0 Vicryl sutures.  The skin was closed using subcuticular 3-0 undyed running Vicryl suture.  Skin edges were reinforced with durable Dermabond.  A Mepilex dressing was applied.  The blood loss was less than 100 mL.  During the procedure, the hemoglobin result was 6 grams.  The anesthesia team decided to give the patient a total of 3 units of blood.  Just prior to positioning the patient on bed and before she awakened, I performed a digital rectal examination.  No masses were identified. There was  a very small amount of stool in the rectum, which was tested for occult blood and was positive.  It should be mentioned also the patient had a nasogastric tube in place and the effluent from the nasogastric tube was not suggestive of the presence of blood.  This presume that chronic anemia is on a gastrointestinal basis.  We will have a GI consult postoperatively.  The patient was returned to the recovery room in satisfactory condition where she was stable.  She was able to actively dorsiflex and plantar flex both feet and ankles.     Nelda Severe, MD     MT/MEDQ  D:  10/11/2010  T:  10/11/2010  Job:  [id number]  Electronically Signed by Nelda Severe MD on 10/18/2010 05:55:32 PM

## 2010-10-19 ENCOUNTER — Inpatient Hospital Stay (HOSPITAL_COMMUNITY): Payer: Medicare Other

## 2010-10-19 LAB — CBC
Hemoglobin: 12.1 g/dL (ref 12.0–15.0)
Platelets: 223 10*3/uL (ref 150–400)
RBC: 4.5 MIL/uL (ref 3.87–5.11)
WBC: 9.6 10*3/uL (ref 4.0–10.5)

## 2010-10-19 LAB — BASIC METABOLIC PANEL
Calcium: 9 mg/dL (ref 8.4–10.5)
GFR calc Af Amer: >60 mL/min (ref >60)
GFR calc non Af Amer: 52 mL/min — ABNORMAL LOW (ref >60)
Glucose, Bld: 116 mg/dL — ABNORMAL HIGH (ref 70–99)
Sodium: 138 mEq/L (ref 135–145)

## 2010-10-19 LAB — PROTIME-INR
INR: 2.32 — ABNORMAL HIGH (ref 0.00–1.49)
Prothrombin Time: 25.6 seconds — ABNORMAL HIGH (ref 11.6–15.2)

## 2010-10-20 ENCOUNTER — Inpatient Hospital Stay (HOSPITAL_COMMUNITY): Payer: Medicare Other

## 2010-10-20 ENCOUNTER — Encounter (HOSPITAL_COMMUNITY): Payer: Self-pay | Admitting: Radiology

## 2010-10-20 LAB — BASIC METABOLIC PANEL
BUN: 11 mg/dL (ref 6–23)
GFR calc Af Amer: >60 mL/min (ref >60)
GFR calc non Af Amer: 56 mL/min — ABNORMAL LOW (ref >60)
Potassium: 3.3 mEq/L — ABNORMAL LOW (ref 3.5–5.1)

## 2010-10-20 LAB — CBC
Hemoglobin: 10.6 g/dL — ABNORMAL LOW (ref 12.0–15.0)
MCHC: 33.5 g/dL (ref 30.0–36.0)
RBC: 3.95 MIL/uL (ref 3.87–5.11)
WBC: 6.4 10*3/uL (ref 4.0–10.5)

## 2010-10-20 LAB — PROTIME-INR
INR: 2.93 — ABNORMAL HIGH (ref 0.00–1.49)
Prothrombin Time: 30.6 seconds — ABNORMAL HIGH (ref 11.6–15.2)

## 2010-10-21 ENCOUNTER — Inpatient Hospital Stay
Admission: RE | Admit: 2010-10-21 | Discharge: 2010-10-31 | Disposition: A | Discharge status: Home / Self Care | Payer: 59 | Source: Ambulatory Visit | Attending: Internal Medicine | Admitting: Internal Medicine

## 2010-10-21 DIAGNOSIS — R109 Unspecified abdominal pain: Principal | ICD-10-CM

## 2010-10-21 LAB — PROTIME-INR: INR: 3.08 — ABNORMAL HIGH (ref 0.00–1.49)

## 2010-10-21 LAB — BASIC METABOLIC PANEL
Calcium: 8.1 mg/dL — ABNORMAL LOW (ref 8.4–10.5)
Chloride: 105 mEq/L (ref 96–112)
Creatinine, Ser: 0.93 mg/dL (ref 0.4–1.2)
GFR calc Af Amer: >60 mL/min (ref >60)

## 2010-10-21 LAB — CBC
HCT: 30.5 % — ABNORMAL LOW (ref 36.0–46.0)
Platelets: 227 10*3/uL (ref 150–400)
RDW: 14.5 % (ref 11.5–15.5)
WBC: 6.4 10*3/uL (ref 4.0–10.5)

## 2010-10-27 ENCOUNTER — Ambulatory Visit (HOSPITAL_COMMUNITY): Payer: Medicare Other | Attending: Internal Medicine

## 2010-10-27 ENCOUNTER — Inpatient Hospital Stay (HOSPITAL_COMMUNITY): Payer: Medicare Other

## 2010-10-27 ENCOUNTER — Inpatient Hospital Stay (HOSPITAL_COMMUNITY): Payer: Medicare Other | Attending: Internal Medicine

## 2010-10-29 NOTE — Consult Note (Signed)
[name], [name]               ACCOUNT NO.:  [id number]  MEDICAL RECORD NO.:  [id number]           PATIENT TYPE:  I  LOCATION:  5012                         FACILITY:  [facility]  PHYSICIAN:  Baltazar Najjar, MD     DATE OF BIRTH:  01/15/1941  DATE OF CONSULTATION: DATE OF DISCHARGE:                                CONSULTATION   REASON FOR CONSULTATION:  Cough/radiographic findings suggesting pneumonia.  HISTORY OF PRESENT ILLNESS:  Mrs. [name] is a 70 year old pleasant Caucasian woman, who was admitted to the hospital for lumbar spine surgery which was done on October 11, 2010.  The patient started having cough productive of thick yellowish sputum for the last 2-3 days. Denies any associated fever or chills.  She denied any chest pain.  She had chest x-ray ordered today that showed findings suggestive of possible developing pneumonia or aspiration and we were asked to see her in consultation for management of her pneumonia.  PAST MEDICAL HISTORY: 1. History of diastolic congestive heart failure. 2. History of paroxysmal atrial fibrillation. 3. History of pulmonary emboli, on Coumadin. 4. Microcytic anemia. 5. History of large hiatal hernia. 6. Hypertension. 7. Gastroesophageal reflux disease. 8. Dyslipidemia. 9. History of GI bleeding.  PAST SURGICAL HISTORY: 1. Status post cholecystectomy. 2. Appendectomy. 3. Tonsillectomy. 4. Lumbar surgery as above. 5. History of exploratory laparotomy.  ALLERGIES: 1. SHE IS ALLERGIC TO DARVOCET. 2. NSAIDS AND COX-2 INHIBITOR. 3. CONTRAST MEDIA. 4. AMLODIPINE. 5. XOPENEX. 6. ADHESIVE. 7. DIPHENHYDRAMINE.  SOCIAL HISTORY:  The patient lives with her husband.  Denies smoking, drinking, or illicit drug use.  FAMILY HISTORY:  Significant for coronary artery disease in father and peripheral vascular disease in mother.  REVIEW OF SYSTEMS:  CHEST:  Significant for cough productive of yellowish sputum as above.  No shortness  of breath.  No fever or chills. CARDIOVASCULAR:  No chest pain.  No palpitation.  ABDOMEN:  Significant for nausea.  She vomited once couple of days ago as per history.  No diarrhea or constipation.  She does have black stools as per her chronically.  GU:  No dysuria.  No flank pain or urinary frequency.  No constipation.  No fever or chills.  PHYSICAL EXAMINATION:  VITAL SIGNS:  Blood pressure 158/68, pulse 78, temperature 98.2, O2 sat 96% on room air. GENERAL:  She is alert, not in distress. NECK:  Supple.  No JVD. LUNGS:  Clear to auscultation bilaterally. CARDIOVASCULAR:  S1 and S2, regular rhythm and rate. ABDOMEN:  Obese, soft, nontender.  Bowel sounds heard. EXTREMITIES:  Showed no pedal edema.  LABORATORY DATA:  Labs showed potassium of 3.8, BUN 13, creatinine 0.8. WBC 7.4, hemoglobin 10.3, hematocrit 31.5, platelet count 142.  INR 1.64.  Chest x-ray showed left lower lobe atelectasis versus developing air space disease, suggestive of possible developing pneumonia and/or aspiration, not entirely excluded.  ASSESSMENT/PLAN: 1. Hospital-acquired pneumonia.  As per radiographic findings above.     There is no evidence of fever, hypoxemia, or leukocytosis at this     time.  We will start the patient on Zosyn and vancomycin and that  can be transitioned quickly to oral antibiotics. 2. Atrial fibrillation, rate controlled.  The patient is also on     Coumadin.  Her INR is subtherapeutic.  I will ask pharmacy to dose     her Coumadin dose.  She is on diltiazem for rate control and her     heart rate is currently controlled. 3. History of pulmonary embolism.  The patient is on Coumadin as     above.  Her INR is subtherapeutic.  We will ask pharmacy to dose. 4. Hypertension.  Her blood pressure is fluctuating.  Episodes of     elevated blood pressure probably exacerbated by pain.  She is     currently on lisinopril and diltiazem and that can be adjusted if     her blood pressure  remains elevated. 5. Recent lumbar surgery.  Plan as per Orthopedic team. 6. Anemia with positive stool for occult blood.  The patient seen by     GI during this hospitalization and she is planned for colonoscopy.     As per GI, her H and H is currently stable.  Thank you for the consult.  We will follow along with you.          ______________________________ Baltazar Najjar, MD     SA/MEDQ  D:  10/17/2010  T:  10/17/2010  Job:  [id number]  Electronically Signed by [name] Beat MD on 10/29/2010 08:12:47 PM

## 2010-11-06 NOTE — H&P (Signed)
  [name]               ACCOUNT NO.:  [id number]  MEDICAL RECORD NO.:  [id number]           PATIENT TYPE:  I  LOCATION:  5012                         FACILITY:  [facility]  PHYSICIAN:  Nelda Severe, MD      DATE OF BIRTH:  04/29/1941  DATE OF ADMISSION:  10/11/2010 DATE OF DISCHARGE:  10/21/2010                             HISTORY & PHYSICAL   PREOP HISTORY AND PHYSICAL:  Right lower extremity and lumbar pain.  DRUG ALLERGIES:  IVP DYE, DARVOCET and BENADRYL.  CURRENT MEDICATIONS: 1. Toprol-XL 50 mg p.o. 2. Cardizem 180 mg p.o. daily. 3. Lisinopril/hydrochlorothiazide 20/25 mg p.o. daily. 4. Coumadin 5 mg p.o. daily. 5. Temazepam 15 mg p.o. at bedtime. 6. __________ 0.5 mg t.i.d. as needed. 7. Prilosec 20 mg p.o. daily. 8. Pravastatin 40 mg p.o. daily.  CHIEF COMPLAINT:  Chronic right lower extremity pain.  SURGICAL HISTORY:  She has had surgery for cholecystectomy, appendectomy, tonsillectomy. SOCIAL HISTORY:  She is a nonsmoker, non-alcohol drinker.  PRIMARY HISTORY:  Hypertension, right leg pain, and a history of pulmonary embolism.  FAMILY HISTORY:  Coronary artery disease, stroke and cancer.  REVIEW OF SYSTEMS:  No fever, no chills.  No nausea, vomiting or diarrhea.  No chronic cough.  No chest pain or shortness of breath.  No hemoptysis.  No melena.  She has had no recent cough with no temperature.  PHYSICAL EXAMINATION:  HEENT:  She appears normocephalic.  Pupils are equal, round, and reactive to light. NECK:  Supple. CHEST:  Clear to auscultation.  No wheezes are noted. HEART:  Regular rate and rhythm.  No murmurs noted. ABDOMEN:  Soft.  Positive bowel sounds auscultated. EXTREMITY:  Posterior right leg pain.  Strength and sensation are intact and equal bilaterally. SKIN:  Clean, dry and intact.  No open sores, no rashes noted.  IMAGING:  MRI was reviewed and shows L3-4, L4-5 herniated nucleus pulposus.  OPERATIVE PLAN:  Laminectomy, diskectomy  with positive implants.  She will stop her Coumadin 5 days prior to surgery and we will restart it postoperatively.  We did get medical clearance from both her primary care and her cardiac doctor.     [name], P.A.   ______________________________ Nelda Severe, MD    MC/MEDQ  D:  10/21/2010  T:  10/22/2010  Job:  [id number]  Electronically Signed by [name] P.A. on 11/04/2010 12:08:12 PM Electronically Signed by Nelda Severe MD on 11/06/2010 01:40:54 PM

## 2010-11-06 NOTE — Discharge Summary (Signed)
[name], [name]               ACCOUNT NO.:  [id number]  MEDICAL RECORD NO.:  [id number]           PATIENT TYPE:  I  LOCATION:  5012                         FACILITY:  [facility]  PHYSICIAN:  Nelda Severe, MD      DATE OF BIRTH:  [birth year]  DATE OF ADMISSION:  10/11/2010 DATE OF DISCHARGE:                              DISCHARGE SUMMARY   PRIMARY DIAGNOSES:  L3-L4 foraminal stenosis with calcified synovial cyst, L3-L4 disk herniation and L4-L5 central and foraminal stenosis.  BRIEF HISTORY:  She was brought to [facility] on October 11, 2010 and underwent surgery by Dr. Nelda Severe foraminotomies, extraction of loose bodies at L3-L4 right side, L4-L5 fusion with ILIF system including allograft.  Postoperatively, she was admitted to step-down unit for close observation.  She was neurovascularly motor intact.  She developed a cough which eventually we tried to treat with guaifenesin cough medicine she was then moved to 5000, started with physical therapy.  She was started on Coumadin to regain an INR goal of 2.0-3.00. We did get a GI consult secondary to low starting hemoglobin.  She was transfused 3 units during surgery to maintain a hemoglobin of 12.1 postoperatively.  GI consult, they will work up further as an outpatient by doing a colonoscopy in the future, the consult was done by Dr. Madilyn Fireman. We also got a medical consult on October 17, 2010 for developed a pneumonia.  She had a bout of coughing and vomit and we think it was aspiration pneumonia.  She has been on vancomycin IV and Zosyn IV. Pending a CT scan today the patient will go on p.o. medications for discharge p.o. medicine is proceed to be Avelox, she will likely stay on that for an extended duration per medical advice it is Dr. Cleotis Lema that is following the pneumonia and Coumadin dosing for INR goal of 2.0- 3.0.  She will follow up with her primary care within 10 days of discharge.  Her ambulation is stable.  Her  right leg pain is significantly improved since surgery.  Wound is clean, dry, and intact. No active drainage.  No erythema.  Her diet is regular.  She is using her incentive spirometer q. hour while awake.  Her INR today is 3.1.  PLAN:  She will be transferred to an extended care facility that is being coordinated by the social worker [facility] until she is medically and physically stable to be discharged home under her own care.  She will likely be placed on Avelox antibiotics.  They said at least for 10 days and follow up with primary care with in that time frame for a check up due to the pneumonia.  INR should be goals at 2.0- 3.0.  Coumadin dosing currently is 4 mg p.o. daily.  MEDICATIONS:  Other current medications that she had continue: 1. Colace 100 mg p.o. b.i.d. 2. Ferrous sulfate 325 p.o. t.i.d. 3. Diltiazem 180 mg p.o. daily. 4. Protonix 40 mg p.o. b.i.d. 5. Lisinopril 20 mg p.o. daily. 6. Lasix 40 mg p.o. daily. 7. Crestor 5 mg p.o. at bedtime. 8. Guaifenesin and dextromethorphan 1 tablet  p.o. b.i.d. 9. Coumadin 4 mg p.o. daily with the goal of INR 2.0-3.0. 10.Maalox suspension 30 mL p.o. q. 6 p.r.n. 11.Tylenol 650 p.o. q.4 p.r.n. 12.Ultram 50 mg 1-2 q. 4 p.o. p.r.n. for pain control. 13.Xopenex 0.63 mg, 3 mL inhaled solution q. 4 p.r.n. 14.Robaxin 500 mg one q.8 p.o. p.r.n. for muscle spasms. 15.Restoril 15 mg p.o. at bedtime for sleep.  We want her to walk for exercise upper and lower extremity mild strengthening, range of motion.  No bending, lifting, stooping.  No dressing is needed for incision.  She will follow up in Dr. Casimiro Needle Agnes Brightbill's office in approximately 4 weeks.  Regular diet.  DISPOSITION:  Stable.  FINAL DIAGNOSES:  L3-L4, L4-L5 herniated disk with stenosis and loose body extraction.     [name], P.A.   ______________________________ Nelda Severe, MD    MC/MEDQ  D:  10/21/2010  T:  10/21/2010  Job:  [id number]  Electronically  Signed by [name] P.A. on 11/04/2010 12:08:02 PM Electronically Signed by Nelda Severe MD on 11/06/2010 01:40:56 PM

## 2010-11-15 NOTE — Discharge Summary (Signed)
NAMESHERRON, MAPP               ACCOUNT NO.:  [id number]   MEDICAL RECORD NO.:  [id number]          PATIENT TYPE:  INP   LOCATION:  1418                         FACILITY:  [facility]   PHYSICIAN:  Kela Millin, M.D.DATE OF BIRTH:  1941-01-21   DATE OF ADMISSION:  11/30/2006  DATE OF DISCHARGE:  12/06/2006                               DISCHARGE SUMMARY   DISCHARGE DIAGNOSES:  1. Pulmonary emboli, multifocal bilateral.  2. Iron-deficiency anemia with hemoccult positive stools, secondary to      gastric erosions per Gastroenterology, status post      esophagogastroduodenoscopy.  3. Gastroesophageal reflux disease.  4. Hypertension.  5. Hypercholesterolemia.  6. History of stroke at the age of 10.  7. History of pulmonary embolism in 2004.   CONSULTATIONS:  Gastroenterology, Dr. Randa Evens.   PROCEDURES AND STUDIES:  1. CT angiogram of chest done at [facility] read as      multifocal bilateral pulmonary emboli seen in the right middle      lobe, right lower lobe and left lower lobe.  2. EGD:  Gastritis with erosions, a large hiatal hernia.   HISTORY:  The patient is a 70 year old white female with the above-  listed medical problems who presented with shortness of breath and  abnormal CT angiogram of the chest done at an outside facility.  She  reported that about three weeks prior to admission she had developed  some cold symptoms and a bad cough.  She reported that the cough was  productive of nonbloody phlegm and she saw her primary care physician on  Nov 19, 2006 and was prescribed a five-day course of Zithromax.  Her  symptoms did not improve and she went to the walk-in clinic and was told  that she was wheezing and Xopenex nebulizer treatment was given and she  had an adverse reaction to that.  She was prescribed a one-week  prednisone taper, which she completed on the day of admission, but her  shortness of breath had continued to worsen and she also developed  back  pain and so she presented for the CT angiogram of her chest and the  results are as stated above, positive for pulmonary emboli.   Her physical exam upon admission as per Dr. Rudean Curt revealed a  temperature of 98.4 with a pulse of 70, blood pressure of 160/52,  respiratory rate of 20 and O2 sat of 97% on room air.  Her lung exam was  clear to auscultation bilaterally.  Cardiovascular was regular rate and  rhythm.  Normal S1, S2.  The rest of her physical exam was also reported  to be within normal limits.   LABORATORY DATA:  CT angiogram of the chest is as per HPI.   HOSPITAL COURSE:  1. Bilateral pulmonary emboli:  Upon admission, the patient was      started on anticoagulation with Lovenox followed by Coumadin.      Workup for hypercoagulable disorder was done and this revealed an      elevated intact thrombin level of 127, protein S function level      decreased  at 72.  Her alpha-fetoprotein level was 2.4.  The lupus      anticoagulant was negative.  Anticardiolipin antibody negative.      The factor V Leiden level was pending at the time of this      dictation.  It was noted that this patient's second pulmonary      embolus and so the recommendation for patient to be on Coumadin      long term.  Her INR today is 1.9 and the patient has requested to      go home to attend to important business and so she will be      discharged on Lovenox along with the Coumadin.  She will followup      with her primary care physician in the morning for a PT/INR level.      Once the INR level becomes therapeutic, between 2 and 3, the      Coumadin and Lovenox will be overlapped per her primary care      physician for 24-48 hours as recommended.  After that, the Lovenox      will be discontinued if her INR remains therapeutic.  She will      continue the Coumadin long term with monitoring of her PT/INR and      Coumadin dosing as appropriate per her primary care physician.  2. Gastritis with  erosions with iron-deficiency anemia:  The patient      was noted to be hemoccult positive and anemia workup revealed an      iron level of 22 with a ferritin level of 9.  Gastroenterology was      consulted and Dr. Randa Evens saw the patient and an EGD was done on      December 04, 2006 and the results are stated above.  The patient was      placed on proton pump inhibitor and she is to continue this upon      discharge and she will followup with Dr. Dorena Cookey in one month.  3. Hypertension:  The patient was maintained on her outpatient      antihypertensive during her hospital stay.   DISCHARGE MEDICATIONS:  1. Lovenox 65 mg subcu every 12 hours.  2. Coumadin 6 mg p.o. daily.  3. Iron sulfate 325 mg p.o. b.i.d.  4. Senokot two p.o. q.h.s., hold for diarrhea.  5. The patient is to continue Toprol-XL, Temazepam, lisinopril and      Hydrochlorothiazide as previously.   FOLLOW-UP CARE:  1. Dr. Laurann Montana in the morning for PT/INR recheck and Coumadin      levels and adjustment as above.  2. Dr. Dorena Cookey in one month.  The patient is to call for an      appointment.   DISCHARGE CONDITION:  Improved, stable.      Kela Millin, M.D.  Electronically Signed     ACV/MEDQ  D:  12/06/2006  T:  12/06/2006  Job:  [id number]   cc:   Everardo All. Madilyn Fireman, M.D.  Fax: [phone]   Stacie Acres. Cliffton Asters, M.D.  Fax: [phone]

## 2010-11-15 NOTE — Op Note (Signed)
[name], [name]               ACCOUNT NO.:  [id number]   MEDICAL RECORD NO.:  [id number]          PATIENT TYPE:  INP   LOCATION:  1418                         FACILITY:  [facility]   PHYSICIAN:  James L. Malon Kindle., M.D.DATE OF BIRTH:  01-04-1941   DATE OF PROCEDURE:  12/04/2006  DATE OF DISCHARGE:                               OPERATIVE REPORT   PROCEDURE:  Esophagogastroduodenoscopy.   MEDICATIONS:  1. Cetacaine spray.  2. Fentanyl 30 mcg.  3. Versed 4 mg IV.   INDICATIONS:  Patient with admission for pulmonary embolus, with heme-  positive stools.  She has been requiring anticoagulation and is going to  be worked up for hypercoagulable state.  This is the second time that  she has had this.  She has been worked up in the past with an endoscopy  and I believe a colonoscopy, with small gastric ulcers found with her  first pulmonary embolus.  Since she will require anticoagulation, this  is just been started, we will go ahead and perform an endoscopy just to  rule out a gross ulcer.   DESCRIPTION OF PROCEDURE:  The procedure had been explained to the  patient and consent obtained.  Left lateral decubitus position.  The  endoscope was inserted and advanced.  It was passed into the esophagus  easily with deglutination.  The stomach was entered.  Pylorus was  identified and passed.  The duodenum, including the bulb and second  portion, was entirely unremarkable.  No ulceration or inflammation.  The  scope was withdrawn back into the stomach.  There were several small  gastric erosions and a slight linear gastritis.  The patient is on  anticoagulation.  I elected not to biopsy it at this time.  These were  too small I think to have cause any significant bleeding.  The stomach  was examined carefully in the forward and retroflexed view.  There were  no ulcers or any other lesions.  The diaphragmatic hiatus was located at  38 cm and the Z-line at 32.  The patient does have a 6 cm  hiatal hernia.  The scope was withdrawn.  The patient tolerated the procedure well.   ASSESSMENT:  Heme-positive stool, almost certainly due to the gastric  erosions.   PLAN:  Will treat with a PPI and follow clinically.  Will have Dr. Madilyn Fireman  follow her up in the office at a later date.           ______________________________  Llana Aliment. Malon Kindle., M.D.     Waldron Session  D:  12/04/2006  T:  12/04/2006  Job:  [id number]   cc:   Stacie Acres. Cliffton Asters, M.D.  Fax: [phone]   Everardo All. Madilyn Fireman, M.D.  Fax: [phone]

## 2010-11-15 NOTE — H&P (Signed)
NAMELOUIE, MEADERS               ACCOUNT NO.:  [id number]   MEDICAL RECORD NO.:  [id number]          PATIENT TYPE:  INP   LOCATION:  0105                         FACILITY:  [facility]   PHYSICIAN:  Andres Shad. Rudean Curt, MD     DATE OF BIRTH:  1940/07/29   DATE OF ADMISSION:  11/30/2006  DATE OF DISCHARGE:                              HISTORY & PHYSICAL   CHIEF COMPLAINT:  Shortness of breath and abnormal CT angiogram.   HISTORY OF PRESENT ILLNESS:  Ms. [name] is a 70 year old female who was  well until approximately three weeks ago when she began to develop cold  symptoms and a bad cough.  Her cough was productive of nonbloody phlegm.  She was seen by her primary care physician on the 19th of May with  similar symptoms and was prescribed a five day course of azithromycin.  This did not result in any significant change in her symptoms.  On May  23, she was seen at a walk-in clinic where she was felt to be wheezing.  She was given a Xopenex nebulizer treatment, to which she had an adverse  reaction.  She was prescribed a one week tapering course of prednisone  that finished today.  Over the course of this past week, she has  continued to have worsening shortness of breath and then yesterday  developed pain in her back.   Review of systems is otherwise negative for symptoms such as fever, leg  swelling, or syncope.   PAST MEDICAL HISTORY:  1. Hypertension.  2. Hypercholesterolemia.  3. Gastroesophageal reflux.  4. She had a stroke at 70 years of age.  5. She had a pulmonary embolism in 2004.   Past surgical history includes a remote laparotomy, cholecystectomy,  appendectomy, and lumbar surgery.  She has had a colonoscopy in the last  four years and has had a recent Pap smear and mammogram that were all  negative.   CURRENT MEDICATIONS:  1. Toprol XL 100 mg daily.  2. Temazepam 15 mg before bed.  3. Lisinopril 20 mg daily.  4. Hydrochlorothiazide 25 mg daily.   ALLERGIES:  IVP DYE,  DARVOCET, NORVASC, and XOPENEX.   SOCIAL HISTORY:  Negative for tobacco, alcohol, or drug use.   The patient denies any recent injuries to the lower extremities or  abdomen.  She denies any recent distant travel by car or plane.   PHYSICAL EXAMINATION:  VITAL SIGNS:  Temperature 98.4, heart rate 70,  blood pressure 160/52, respiratory rate 20.  Oxygen saturation 97% on  room air.  GENERAL APPEARANCE:  Patient was alert and well-appearing in no apparent  distress.  HEENT:  Mucous membranes are moist.  LUNGS:  Clear to auscultation bilaterally.  CARDIOVASCULAR:  Regular.  Normal S1 and S2.  No murmurs, rubs or  gallops.  ABDOMEN:  Soft, nontender, nondistended.  No organomegaly.  EXTREMITIES:  Warm and well perfused.  No edema.  No calf tenderness.   CT angiogram was performed at [facility].  This was read as  multifocal bilateral pulmonary emboli seen in the right middle lobe,  right lower  lobe, and left lower lobe.   ASSESSMENT:  This is a 70 year old woman who presents with a recurrent  pulmonary embolus four years after her previous one.  She has no clearly  identified underlying risk factors.  She does not take hormone  replacement therapy.  She has not had any significant injuries or  surgeries to her lower extremities, and her abdominal surgeries are all  very remote.  She is up to date on her cancer screening and does not  have a review of systems suggestive of an underlying malignancy.  Her  stroke at the age of 38 raises the prospect of an underlying  hypercoagulability disorder.   PLAN:  1. Admit to a telemetry bed.  2. Intravenous fluids.  3. Anticoagulation with Lovenox followed by Coumadin.  The duration of      her Coumadin therapy will be at least six months but will probably      end up being 12 months or longer.  4. I will send hypercoagulability and vasculitis labs.  5. The patient will remain on her antihypertensives.      Andres Shad. Rudean Curt,  MD  Electronically Signed     PML/MEDQ  D:  11/30/2006  T:  11/30/2006  Job:  [id number]   cc:   Stacie Acres. Cliffton Asters, M.D.  Fax: [phone]

## 2010-11-15 NOTE — Consult Note (Signed)
[name], POPE               ACCOUNT NO.:  [id number]   MEDICAL RECORD NO.:  [id number]          PATIENT TYPE:  INP   LOCATION:  1418                         FACILITY:  [facility]   PHYSICIAN:  John C. Madilyn Fireman, M.D.    DATE OF BIRTH:  Jun 30, 1941   DATE OF CONSULTATION:  12/03/2006  DATE OF DISCHARGE:                                 CONSULTATION   REASON FOR CONSULTATION:  Anemia, heme-positive stool.   HISTORY OF PRESENT ILLNESS:  Patient is a 70 year old white female who  presented with acute dyspnea on Nov 30, 2006, and was found to have a  pulmonary embolus that was multifocal in the right lung.  She was  admitted for this and started on heparin and Coumadin. She was also  noticed to have hemoglobin on admission 8.2 with an MCV of 81.  A stool  guaiac was checked and this was positive.  The patient denies any  abdominal pain, significant heartburn, dysphagia, nausea or vomiting,  black stools or rectal bleeding.  She denies any nonsteroidal anti-  inflammatory drugs and has no family history of GI malignancy.  She had  an almost identical presentation in 2005 and I did an EGD on her.  It  showed a large hiatal hernia, esophageal ring and two small gastric  ulcers.  She was treated with Protonix.   PAST MEDICAL HISTORY:  1. Hypertension.  2. Hypercholesterolemia.  3. Gastroesophageal reflux.  4. CVA at age 75.   MEDICATIONS:  1. Toprol.  2. Lisinopril.  3. Hydrochlorothiazide.  4. Restoril.   ALLERGIES:  IV DYE, NORVASC, DARVOCET.   SOCIAL HISTORY:  Patient denies alcohol or tobacco use.   PHYSICAL EXAMINATION:  GENERAL APPEARANCE:  A well-developed, well-  nourished white female in no acute distress.  LUNGS:  Clear.  CARDIOVASCULAR:  Regular rate and rhythm without murmur.  ABDOMEN:  Soft and nondistended with normoactive bowel sounds.  No  hepatosplenomegaly, mass or guarding.   IMPRESSION:  Heme-positive in a patient with pulmonary embolism on  Coumadin with  suspicion of hypercoagulable state and history of a large  hiatal hernia, esophageal ring and two small gastric ulcers three years  ago with negative colonoscopy at the same time.   PLAN:  Would probably pursue repeat EGD this admission to rule out  active peptic ulcer disease.  Would reserve colonoscopy for negative  findings and persistent heme-positive stools and anemia.           ______________________________  Everardo All. Madilyn Fireman, M.D.     JCH/MEDQ  D:  12/03/2006  T:  12/03/2006  Job:  [id number]   cc:   Stacie Acres. Cliffton Asters, M.D.  Fax: [phone]

## 2010-11-16 NOTE — Consult Note (Signed)
  [name]               ACCOUNT NO.:  [id number]  MEDICAL RECORD NO.:  [id number]           PATIENT TYPE:  I  LOCATION:  3302                         FACILITY:  [facility]  PHYSICIAN:  [name], M.D.    DATE OF BIRTH:  12/08/1940  DATE OF CONSULTATION:  10/12/2010 DATE OF DISCHARGE:                                CONSULTATION   REASON FOR CONSULTATION:  Anemia and heme-positive stool.  HISTORY OF ILLNESS:  The patient is a 70 year old white female, who was admitted for lumbar surgery done on the 11.  She had a preoperative hemoglobin of 8.8 with an MCV of 79.7, and normal platelet count.  She was also found to have occult guaiac positive stools.  She denies any GI symptoms currently or recently.  She has had EGD for anemia and heme- positive stools in 2008 and 2011, both showing large hiatal hernia with Sheria Lang erosions.  She had a colonoscopy in 2005, which was normal.  She also had an esophageal dilatation in 2005.  PAST MEDICAL HISTORY:  History of pulmonary emboli, on chronic Coumadin therapy, hypertension, hyperlipidemia, gastroesophageal reflux.  SURGERIES:  History of cholecystectomy, lumbar laminectomy.  MEDICATIONS:  Coumadin, currently on hold, gabapentin, furosemide, Protonix, diltiazem.  ALLERGIES:  IVP DYE, AMLODIPINE, DARVOCET.  SOCIAL HISTORY:  The patient denies alcohol or tobacco use.  FAMILY HISTORY:  Positive for ovarian cancer involving the colon in her sister.  PHYSICAL EXAMINATION:  GENERAL:  Alert, oriented, in no acute distress. HEART:  Regular rate and rhythm without murmurs. LUNGS:  Clear. ABDOMEN:  Soft, nondistended, with normoactive bowel sounds.  No hepatosplenomegaly, mass, or guarding.  IMPRESSION:  Anemia and heme-positive stools, the patient is on chronic Coumadin therapy with known large hiatal hernia, last colonoscopy 7 years ago.  PLAN:  The patient is due for repeat colonoscopy.  Probably, no need for repeat EGD at  this time given the last study within a year.  This could be done electively, but the patient would like to have it done prior to discharge.  This is advisable from postop standpoint, could arrange within the next few days.  We will follow with you.          ______________________________ Everardo All. Madilyn [name], M.D.     JCH/MEDQ  D:  10/12/2010  T:  10/12/2010  Job:  [id number]  Electronically Signed by Dorena Cookey M.D. on 11/16/2010 08:54:28 PM

## 2010-11-18 NOTE — Discharge Summary (Signed)
NAME:  [name], [name]                      ACCOUNT NO.:  [id number]   MEDICAL RECORD NO.:  [id number]                   PATIENT TYPE:  INP   LOCATION:  0363                                 FACILITY:  [facility]   PHYSICIAN:  Charlaine Dalton. Sherene Sires, M.D. [facility]           DATE OF BIRTH:  06-01-1941   DATE OF ADMISSION:  08/13/2002  DATE OF DISCHARGE:  08/20/2002                                 DISCHARGE SUMMARY   FINAL DIAGNOSES:  1. Pulmonary embolism with right lower lobe infarction.     a. No evidence of hemodynamic compromise and no evidence of venous lower        extremity clotting by venous Doppler August 13, 2002.  2. Hypertension.  3. Chronic anxiety and depression.   HISTORY OF PRESENT ILLNESS:  Please see dictated H&P.   HOSPITAL COURSE:  This is a 70 year old white female who presented to my  office with unexplained chest pain, intensely pleuritic right lower lobe  chest pain, having had a CT scan of the chest done as an outpatient but felt  to not be adequate to rule out pulmonary embolism and therefore CT scan was  repeated on the day of admission and indeed showed evidence of right lower  lobe pulmonary artery occlusion and probable infarction of the right lower  lobe with a very small right pleural effusion.  Her lab work was otherwise  unremarkable.  She did have a positive D-dimer at 1.24.   She was placed on anticoagulation and treated with analgesics.  She  continued to have pleuritic right pain and was restarted on Cox-2 inhibitors  in the form of Vioxx 50 mg tablets daily which seemed to eliminate the pain.  Therefore, she will be restarted on Bextra as an outpatient.   Prior to discharge, she was markedly improved, able to ambulate the hallway  on room air at a slow pace without significant dyspnea and with adequately  controlled pain without the need for narcotics.  She is therefore discharged  in improved condition to maintain a normal diet and activity and to see  Dr.  Sherene Sires in five days for repeat protime.   DISCHARGE MEDICATIONS:  1. Toprol XL 50 mg one daily.  2. Hydrochlorothiazide 25 mg one daily.  3. Ranitidine one b.i.d. empirically.  4. Zoloft one daily (she did not know the dose but I have asked her to     resume the dose that she took as an outpatient).  5. Bextra one daily for five days only and then stop.  6. Coumadin dosed per pharmacy.  7. For sleep or nerves, she can use Xanax 0.25 mg one daily.   I have asked her not to resume work until she is seen in the office in five  days.   My long-term plan is to treat her for six months and then stop the Coumadin  and perhaps do a hypercoagulability profile as we do not have  an approximate  cause for the pulmonary embolism in this particular case, making  hypercoagulability a possibility.                                               Charlaine Dalton. Sherene Sires, M.D. [facility]    MBW/MEDQ  D:  08/20/2002  T:  08/20/2002  Job:  [id number]   cc:   Tama Headings. Marina Goodell, M.D.  [address]  [place]  [place] [address]  Fax: [phone]

## 2010-11-18 NOTE — H&P (Signed)
[name]               ACCOUNT NO.:  [id number]   MEDICAL RECORD NO.:  [id number]          PATIENT TYPE:  INP   LOCATION:  0445                         [facility]ACILITY:  [facility]   PHYSICIAN:  Deirdre Peer. Polite, M.D. DATE OF BIRTH:  01-05-1941   DATE OF ADMISSION:  09/12/2004  DATE OF DISCHARGE:                                HISTORY & PHYSICAL   CHIEF COMPLAINT:  Weakness, no energy.   HISTORY OF PRESENT ILLNESS:  A 70 year old female with known history of  microcytic anemia, hypertension, history of PE, CVA, and GERD, who presents  to the hospital as a direct admission after being seen in her primary MD's  office.  Per her primary MD, the patient has complained of dyspnea,  shortness of breath, and just no energy.  The patient had labs drawn which  showed anemia of 7.7.  The patient had a rectal exam which is heme-positive.  Because of the patient's known history of iron deficiency anemia and because  of her symptoms now, she directly admitted to the hospital for further  evaluation.  At the time of my evaluation, the patient is alert and oriented  x 3, no apparent distress, does admit to feeling weak, no energy, and short  of breath with minimal exertion, denies any chest pain or palpitations.  Denies any nausea but does admit to some indigestion.  She does admit to  having some emesis on Friday and Saturday of black material.  Denies any  bright red blood in her vomitus or in stools.  The patient does admit to  having black stools but states stools have been black because of iron.  Does  admit to taking aspirin and Advil occasionally, last week took aspirin a  couple of times a day for a week secondary to her hand feeling kind of numb  which worried her because she thought it may have been CVA related.  The  patient also admits to taking some Advil for pain.  However, as stated,  because of the patient's symptomatic anemia, admission is deemed necessary.   PAST MEDICAL HISTORY:   As stated above.   MEDICATIONS ON ADMISSION:  1.  Aspirin approximately 2 a day for a week.  2.  Toprol 25 mg daily.  3.  Hydrochlorothiazide 25 mg daily.  4.  Ferrous sulfate daily.   SOCIAL HISTORY:  Negative for tobacco, alcohol, or drugs.   PAST SURGICAL HISTORY:  Significant for cholecystectomy in 1980,  appendectomy, and tonsils as a child.   ALLERGIES:  Reports allergy to IV DYE which caused anaphylactic reaction in  the past.   FAMILY HISTORY:  Mother with peripheral vascular disease.  Father COPD,  emphysema, coronary artery disease. One sister deceased questionable  secondary to ovarian cancer.  No brothers and another sister deceased  secondary to a CVA who was a smoker.   REVIEW OF SYSTEMS:  As stated in the HPI, otherwise negative.   PHYSICAL EXAMINATION:  VITAL SIGNS:  Pending at the time of this dictation.  GENERAL:  The patient is pale in appearance.  HEENT:  Pale sclerae.  No oral lesions, no nodes, no JVD.  CHEST:  Clear to auscultation bilaterally.  CARDIOVASCULAR:  Regular.  No S3.  ABDOMEN:  Soft, nontender, no hepatosplenomegaly.  EXTREMITIES:  No edema.  Pale nail beds.  NEUROLOGIC:  Nonfocal.  Cranial nerves 2-12 intact, 5/5 motor.  Deep tendon  reflexes symmetrical.  Gait not tested.   DATA:  Pending.   ASSESSMENT:  1.  Symptomatic anemia, 7.7.  2.  Heme-positive stools.  Please note the patient has had an EGD in the      past, approximately 1 year ago.  Colon without signs of bleeding.  EGD      showed hiatal hernia with a ring.  There also was some shallow ulcers      and gastroenteritis.  3.  Hypertension.  4.  History of pulmonary embolism.  5.  Cerebrovascular accident.  Per patient, at the time had right-sided      weakness at this time.  No residual deficit.   Recommend the patient be admitted to a medicine floor bed.  The patient will  be made NPO except for clear liquids.  Will check serial H&H, will  transfuse.  We started the  patient on proton pump inhibitor, will obtain a  gastroenterology evaluation.      RDP/MEDQ  D:  09/12/2004  T:  09/12/2004  Job:  [id number]

## 2010-11-18 NOTE — Op Note (Signed)
NAME:  [name], [name]                      ACCOUNT NO.:  [id number]   MEDICAL RECORD NO.:  [id number]                   PATIENT TYPE:  INP   LOCATION:  0350                                 FACILITY[facility]   PHYSICIAN:  John C. Madilyn Fireman, M.D.                 DATE OF BIRTH:  May 18, 1941   DATE OF PROCEDURE:  10/21/2003  DATE OF DISCHARGE:  10/21/2003                                 OPERATIVE REPORT   PROCEDURE:  Colonoscopy.   INDICATION FOR PROCEDURE:  Anemia and heme-positive stools.   DESCRIPTION OF PROCEDURE:  The patient was placed in the left lateral  decubitus position and placed on the pulse monitor with continuous low-flow  oxygen delivered by nasal cannula.  She was sedated with 75 mcg IV fentanyl  and 6 mg IV Versed.  The Olympus video colonoscope was inserted into the  rectum and advanced to the cecum, confirmed by transillumination at  McBurney's point and visualization of the ileocecal valve and appendiceal  orifice.  The prep was good.  The cecum, ascending, transverse, descending,  and sigmoid colon all appeared normal with no masses, polyps, diverticula,  or other mucosal abnormalities.  The rectum likewise appeared normal, and  retroflexed view of the anus revealed no obvious internal hemorrhoids.  The  scope was then withdrawn and the patient returned to the recovery room in  stable condition.  She tolerated the procedure well, and there were no  immediate complications.   IMPRESSION:  Normal colonoscopy.                                               John C. Madilyn Fireman, M.D.    JCH/MEDQ  D:  10/21/2003  T:  10/21/2003  Job:  [id number]   cc:   Blanch Media, M.D.  [facility]  [place], [place] [address]  Fax: [phone]

## 2010-11-18 NOTE — Consult Note (Signed)
NAME:  [name], [name]                      ACCOUNT NO.:  [id number]   MEDICAL RECORD NO.:  [id number]                   PATIENT TYPE:  INP   LOCATION:  0350                                 FACILITY:  [facility]   PHYSICIAN:  James L. Malon Kindle., M.D.          DATE OF BIRTH:  1941/04/13   DATE OF CONSULTATION:  10/19/2003  DATE OF DISCHARGE:                                   CONSULTATION   REASON FOR CONSULTATION:  Anemia with heme positive stools.   HISTORY:  A 70 year old woman who was admitted with vertigo and workup  showed severe microcytic anemia with a hemoglobin of around 8.9, that slowly  dropped to 8, with a MCV of 66.  Her stools have been positive.  Her  ferritin was extraordinarily low at 10.  The patient has not seen any  melena, hematochezia, or had any change in bowel habits.  The only  significant GI symptom is that she has chronic dyspepsia that has gotten  progressively worse over the past year, and takes a lot of Tums.  She denies  any Goody's, BC Powders, or any non-steroidal drug use.  She was admitted  with the room spinning around and nauseousness, and has had a very complete  workup here, including CT scan of the head and MRI which has not shown any  obvious problems.  She notes that she has a history of strokes and pulmonary  emboli.   MEDICATIONS:  1. Toprol XL.  2. Hydrochlorothiazide.  3. Aspirin one daily.   ALLERGIES:  1. IVP DYE.  2. DARVOCET.  3. DAIRY PRODUCTS.   PAST MEDICAL HISTORY:  1. History of a stroke in 1982.  Went into anaphylactic shock at that time     with IVP dye.  It is not clear to me whether this was the cause of the     stroke or worsened it.  2. She had pulmonary emboli in February 2004, and was treated by Dr. Sandrea Hughs for some time as an outpatient with management of Coumadin.  She     states her Coumadin was stopped approximately last May.  3. Hypertension.  4. Previous surgeries include tonsillectomy,  cholecystectomy, appendectomy,     carpal tunnel surgery x2.   FAMILY HISTORY:  Remarkable for lack of GI cancer.  She had a sister who had  ovarian cancer and died from this at age 67.  There is no ulcers, no known  history of multiple strokes, etc., in her family.   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   PHYSICAL EXAMINATION:  VITAL SIGNS:  The patient is afebrile, vital signs  normal.  GENERAL:  A pleasant white female, sitting up and eating in the hospital  bed.  Denies any problems.  HEENT:  Eyes:  Sclera nonicteric.  Extraocular movements were intact.  LUNGS:  Clear.  HEART:  Regular rate and rhythm without murmurs or gallops.  ABDOMEN:  Soft, nontender, nondistended.   ASSESSMENT:  Marked iron-deficiency anemia of unclear cause.  This could be  due to chronic blood loss.  The patient has had severe progressive dyspepsia  requiring antacid use.  I think this would be the first place to start.  With this lack of previous workup, I think we really ought to start here.   PLAN:  She will be scheduled for an endoscopy tomorrow.  I discussed this  procedure with her in detail.  Dr. Madilyn Fireman will be performing the procedure.  If this is negative, it is likely that she would need a colonoscopy the  following day.                                               James L. Malon Kindle., M.D.    Waldron Session  D:  10/19/2003  T:  10/19/2003  Job:  [id number]   cc:   Meredith Staggers, M.D.  [address], [address]  [place]  [place] [address]  Fax: [phone]

## 2010-11-18 NOTE — Op Note (Signed)
NAME:  [name], [name]                      ACCOUNT NO.:  [id number]   MEDICAL RECORD NO.:  [id number]                   PATIENT TYPE:  INP   LOCATION:  0350                                 FACILITY:  [facility]   PHYSICIAN:  John C. Madilyn Fireman, M.D.                 DATE OF BIRTH:  11/02/1940   DATE OF PROCEDURE:  10/20/2003  DATE OF DISCHARGE:                                 OPERATIVE REPORT   PROCEDURE:  Esophagogastroduodenoscopy with esophageal dilatation.   INDICATIONS FOR PROCEDURE:  Anemia and heme positive stools as well as  chronic dyspepsia.   DESCRIPTION OF PROCEDURE:  The patient was placed in the left lateral  decubitus position then placed on the pulse monitor with continuous low flow  oxygen delivered by nasal cannula. She was sedated with 50 mcg IV fentanyl  and 5 mg IV Versed. The Olympus video endoscope was advanced under direct  vision into the oropharynx and esophagus. The esophagus was straight and of  normal caliber with the squamocolumnar line at 34 cm imbedded in an  esophageal ring that was relatively patent. There was no visible esophagitis  or significant length of stricture.  There was a very patulous 6 cm length  hiatal hernia distal to the ring. There were two small erosions or shallow  ulcers at the level of the hiatus with no stigma of hemorrhage.  The stomach  was entered and a small amount of liquid secretions were suctioned from the  fundus. Retroflexed view of the cardia confirmed a large hiatal hernia and  two small ulcers and was otherwise unremarkable.  The fundus appeared  normal. The body and antrum showed streaks of erythema and granularity  consistent with mild gastritis.  A CLOtest was obtained.  The pylorus was  nondeformed and easily allowed passage of the endoscope tip into the  duodenum.  Both the bulb and second portion were well inspected and appear  to be within normal limits. The Savary guidewire was passed through the  endoscope channel  and the scope withdrawn.  Savary dilators of 15 and 16 mm  were passed over the guidewire with mild resistance and no blood seen on  withdrawal.  The last dilator was removed together with the wire and the  patient returned to the recovery room in stable condition. She tolerated the  procedure well and there were no immediate complications.   IMPRESSION:  1. Large hiatal hernia.  2. Esophageal ring status post dilatation.  3. Two small gastric ulcers or erosions.  4. Antral gastritis.   PLAN:  Begin Protonix, await CLOtest, observe response to dilatation in  terms of her dysphagia and will proceed with colonoscopy tomorrow.  John C. Madilyn Fireman, M.D.   JCH/MEDQ  D:  10/20/2003  T:  10/20/2003  Job:  [id number]   cc:   Blanch Media, M.D.  [facility]  [place], [place] [address]  Fax: [phone]

## 2010-11-18 NOTE — H&P (Signed)
NAME:  [name], [name]                      ACCOUNT NO.:  [id number]   MEDICAL RECORD NO.:  [id number]                   PATIENT TYPE:  INP   LOCATION:  0350                                 FACILITY:  [facility]   PHYSICIAN:  Blanch Media, M.D.             DATE OF BIRTH:  1941-01-18   DATE OF ADMISSION:  10/16/2003  DATE OF DISCHARGE:                                HISTORY & PHYSICAL   HISTORY OF PRESENT ILLNESS:  [name] is a 70 year old white female with  past medical history significant for hypertension, a CVA in 1982, and a  pulmonary embolus one year ago.  She was in her usual state of health until  2:30 p.m. on the day prior to admission.  She was at work sitting at her  desk when all of a sudden she felt as if the room was spinning.  She became  nauseous and she had decreased balance.  She tried to stand up and was  unable to so she sat back down.  About half an hour passed and she decided  to try to go home.  She was able to ambulate to her car but she did have a  little bit of imbalance.  She got to her car and sat there for a few minutes  because she continued to have nausea and the vertigo with the room spinning.  She was able to drive a bit of a ways but had to pull off into an parking  lot in [employer] next to the [facility] due to the severity of her  symptoms.  She then drove herself to the [facility] and she was  transported by ambulance to [facility].  She vomited three times  in the parking lot and once at [facility].  She complained of a  headache that started after her symptoms started.  She also complained that  her eyes hurt.  She denies any recent urinary tract infection.  She states  that she has had migraines but never with symptoms like this before.  In  fact, she has never had this symptom of vertigo before.   PAST MEDICAL HISTORY:  1. History of a CVA in 1982.  The patient was in [place] for a     kidney  infection.  She had IV dye for a CT scan and went into     anaphylactic shock.  Her right arm and leg became temporarily paralyzed     and she spent time in rehabilitation with no residual effects.  2. Pulmonary embolus in February 2004.  The patient was admitted and treated     by Dr. Sherene Sires.  No appropriate cause was determined and hypercoagulability     workup was planned for after the 6 months of Coumadin.  3. Hypertension.  4. History of tonsillectomy and appendectomy as a child.   REVIEW OF SYSTEMS:  As included in HPI.  In  addition, the patient denies any  respiratory infection, status post, dyspnea, abdominal symptoms, diarrhea,  constipation, urinary tract symptoms.  The patient does have occasional  lower extremity edema.  The patient denies any extremity weakness.   CURRENT MEDICATIONS:  1. Toprol-XL dose unknown.  2. Hydrochlorothiazide, dose unknown.  3. Aspirin adult dosage daily.   ALLERGIES:  1. IV DYE causes anaphylactic reaction.  2. DARVOCET.  3. DAIRY PRODUCTS.   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  She is a life-long nonsmoker.  Denies alcohol use.  Her son is by  the bedside.   FAMILY HISTORY:  Her father had COPD but there are no clotting disorders in  her family.  No diabetes, no heart disease in her family.   PHYSICAL EXAMINATION:  VITAL SIGNS:  Temperature 98.2, blood pressure  187/77, pulse 80, respirations 16, O2 saturation 99% on room air.  GENERAL:  The patient is in no acute distress, lying on a stretcher.  HEENT:  Normocephalic, atraumatic.  HEART:  Regular rate and rhythm.  No murmur, rub, or gallop.  LUNGS:  Clear to auscultation bilaterally with good air movement.  ABDOMEN:  Positive bowel sounds, obese, soft, nontender, nondistended.  EXTREMITIES:  Show +1 pitting edema pretibial bilaterally, +2 DP pulses  bilaterally.  NEUROMUSCULAR:  Strength 5/5 bilaterally all major muscle groups upper and   lower extremities. Finger-to-nose and heel-to-shin testing is intact.  Cranial nerves are intact.  Particularly, she has no nystagmus either on  horizontal gaze or vertical gaze.   ASSESSMENT AND PLAN:  1. Vertigo.  My biggest concern today is that she has had another clotting     abnormality with her history of a cerebrovascular accident and a     pulmonary embolus and an unknown hypercoagulability workup.  I am going     to take an MRI to evaluate the posterior circulation.  Will hold off on     full-dose anticoagulation until the MRI results are back.  Will also     evaluate for an acute MI as females can atypical symptoms so will cycle     cardiac enzymes, check EKG, and check a cardiac echocardiogram in light     of her peripheral edema.  Will not be able to get the results of Dr.     Thurston Hole hypercoagulability workup until the weekday which is 2 days away     so will go ahead and check antiphospholipid antibodies and factor V     Leiden and homocysteine levels.  Will not check protein C and protein S     as an acute thrombotic event can lower these levels.  Will treat     symptomatically with pain control, Phenergan, and Antivert.  Will not     allow the patient to ambulate without assistance.  I reviewed the other     possible nonserious causes including inner ear infection and migraines     with the patient.  As the patient is going to be bedfast will use Lovenox     for DVT prophylaxis.  2. Microcytic anemia.  The patient's hemoglobin on admission was     significantly low with a hemoglobin of 8.9 and MCV of 70 and an RDW of     17.0.  The patient has no known history of anemia.  Will go ahead and     recheck a hemoglobin on a new blood sample to verify this level.  Will     check a ferritin, guaiac stools, and check a vitamin B12 and folate     level.  If it truly is lowered at a level of 9 she will need an anemia     workup including a colonoscopy. 3. Hypertension.  Will not  treat at this time until the etiology of her     vertigo is determined.   ADDENDUM:  Blood work includes sodium 140, potassium 3.9, chloride 109, CO2  27, glucose 104, BUN 11, creatinine 0.8.  White blood cells 5.9, hemoglobin  8.9, MCV 70, RDW 17, platelet count 196, normal differential.  Troponin  0.03, CK 75, MB 166.  UA positive for leukocyte esterase, microscopic 0-2  white blood cells.  EKG:  Normal sinus rhythm, normal intervals, no ischemic  changes.  Chest x-ray:  No acute disease.  CT of the head without contrast  consistent with lacunar infarct.                                               Blanch Media, M.D.    EB/MEDQ  D:  10/17/2003  T:  10/17/2003  Job:  [id number]

## 2010-11-18 NOTE — Consult Note (Signed)
NAMEPATRENA, [name]               ACCOUNT NO.:  [id number]   MEDICAL RECORD NO.:  [id number]          PATIENT TYPE:  INP   LOCATION:  0448                         FACILITY:  [facility]   PHYSICIAN:  Bernette Redbird, M.D.   DATE OF BIRTH:  1941/05/29   DATE OF CONSULTATION:  09/12/2004  DATE OF DISCHARGE:                                   CONSULTATION   Dr. Trula Slade of [facility] asked me to see this 70 year old  female because of anemia and apparent low-grade GI bleeding.   [name] is known to my partner, Dr. Dorena Cookey, who saw her  consultatively a year ago when she presented with severe anemia with a  hemoglobin of 8.9 and MCV 70.  At that time, the patient underwent upper  endoscopy by Dr. Madilyn Fireman that showed a large hiatal hernia (6 cm), an  esophageal ring that was dilated to 16 mm, and a couple of small gastric  erosions or ulcerations.  The patient was treated with PPI therapy but had  difficulty remaining on it due to expense and has used samples off and on.  She apparently has never really had her hemoglobin go above 9 over the past  year since her endoscopy, despite being on iron supplementation.  She does  have some intermittent exposure to aspirin or NSAIDs.   Note that she also had a negative colonoscopy last spring around the same  time as her upper endoscopy.   With that background, the patient developed progressive weakness recently to  the point where she was getting dyspneic just walking across the room and  was seen at her primary physician's office, where her hemoglobin was noted  to be 7.7 (7.5 on repeat here) with microcytic indices, including an MCV of  73 and an elevated RDW of 19, suggestive of iron deficiency.  She was  Hemoccult positive in her doctor's office.  Her stools have been dark,  related to iron supplements; however, in addition, she has what sounds like  coffee-ground emesis on a couple of occasions this past weekend.   ALLERGIES:   Anaphylactic allergy to IVP DYE.   OUTPATIENT MEDICATIONS:  Aspirin p.r.n., Toprol, HCTZ, and ferrous sulfate  as well as occasional use of PPI samples through her doctor's office.   OPERATIONS:  Remote cholecystectomy and appendectomy.   CHRONIC MEDICAL ILLNESSES:  Recurrent anemia.  Also, she has a history of a  pulmonary embolism, CVA, and hypertension.  She is not currently on  Coumadin.   HABITS:  Nonsmoker, nondrinker.   FAMILY HISTORY:  Reviewed from E-chart and is apparently negative for GI  illnesses.   SOCIAL HISTORY:  Lives alone.   REVIEW OF SYSTEMS:  No real dyspeptic symptoms, anorexia, or residual  dysphagia symptoms.  Note that the patient does have loud forced burping in  the evening, which is somewhat bothersome to her, but she is glad that  nobody is around to hear it.   PHYSICAL EXAMINATION:  GENERAL:  A very pale but pleasant, chipper Caucasian  female in no evident distress.  HEENT:  Anicteric.  CHEST:  Clear.  HEART:  Normal.  ABDOMEN:  Without organomegaly, guarding, mass, or tenderness.  RECTAL:  Not repeated.  It was Hemoccult-positive in her primary physician's  office.   LABS:  Pertinent per HPI:  Hemoglobin 7.5.  Normal BUN of 14 with creatinine  of 0.9.  Normal liver chemistries.  Albumin slightly low at 3.2.   IMPRESSION:  1.  Severe microcytic anemia:  Apparently acute-on-chronic.  2.  Recent coffee-ground emesis suggesting a self-limited or low-grade      subacute gastrointestinal bleed.  3.  Hemoccult-positive stool.  4.  History of large hiatal hernia.  5.  History of gastric erosions.  6.  History of esophageal ring with dysphagia dilated successfully about a      year ago without residual dysphagia symptoms.   RECOMMENDATIONS:  1.  Begin with endoscopic evaluation in the morning.  There is no evidence      of active acute GI bleeding at this time.  It would be probably safer      and better for the patient to have her hemoglobin  optimized prior to      sedation for endoscopy.  The risks of the procedure were reviewed, and      the patient is agreeable.  2.  Depending on the endoscopic findings, the patient may be a candidate for      chronic PPI therapy, intravenous iron supplementation, possible hiatal      hernia repair (this has been shown to correct anemia in patient's with      large hiatal hernias who have chronic recurrent iron-deficiency anemia)      and perhaps a capsule endoscopy to rule out confounding sources of blood      loss from the small bowel.   We appreciate the opportunity to have seen this patient in consultation with  you.      RB/MEDQ  D:  09/12/2004  T:  09/12/2004  Job:  [id number]   cc:   [facility]

## 2010-11-18 NOTE — Discharge Summary (Signed)
NAME:  [name], [name]                      ACCOUNT NO.:  [id number]   MEDICAL RECORD NO.:  [id number]                   PATIENT TYPE:  INP   LOCATION:  0350                                 FACILITY[facility]  [facility]   PHYSICIAN:  Hollice Espy, M.D.            DATE OF BIRTH:  1940/11/16   DATE OF ADMISSION:  10/16/2003  DATE OF DISCHARGE:  10/21/2003                                 DISCHARGE SUMMARY   CONSULTANTS:  Dr. [facility].   DISCHARGE DIAGNOSES:  1. Iron-deficiency anemia with no evidence of active bleeding site.  2. Hypertension.  3. Vertigo with acute attack, felt to be likely secondary to labyrinthitis.  4. History of pulmonary embolism.  5. History of cerebrovascular accident.   DISCHARGE MEDICATIONS:  She is resuming her previous medications of -  1. Toprol-XL.  2. Hydrochlorothiazide.  3. Aspirin one p.o. daily.  New discharge medications -  1. Iron 325 mg p.o. b.i.d.  2. Protonix 40 mg one p.o. b.i.d. x1 month, then one p.o. daily.   HISTORY OF PRESENT ILLNESS:  This is a 70 year old white female with a past  medical history of hypertension and cerebrovascular accident who, on the day  of admission on October 16, 2003, had sudden onset of symptoms of room  spinning and nausea.  She was unable upon standing unable to sit or walk  without sense of severe imbalance.  The patient was able to make her way to  [facility], and was then transported by ambulance to [facility].  She  vomited three times in the parking lot at [facility] prior to  admission.  The patient complained of a headache that started after her  symptoms started.  She denied any type of eye pain.  She denied any recent  urinary tract infection.  In the emergency room, the patient had a cardiac  work-up, which was negative for acute MI.  Reportedly, the patient had a  recent work-up for hypercoagulability, given her previous history of CVA.  A  CT scan of her head showed  consistent with the old lacunar infarction, but  nothing acute.  In addition, she was also found to have a hemoglobin of 8.9,  with an MCV of 70, and RDW of 17.  The patient had no previous history of  anemia.  Iron studies, as well as heme testing her stools, were ordered.   HOSPITAL COURSE:  #1.  In regard to patient's vertigo, following a negative  evaluation of her heart and negative CT, an MRI was ordered, which showed no  evidence there.  An old supracerebellar infarction was noted, but not felt  to be the cause of this.  It was felt that her vertigo was likely secondary  to labyrinthitis.  She was given Antivert on admission, and by the second  day her symptoms had already improved.  By the third hospital day, the  patient's symptoms had almost fully resolved,  and she had no recurrence.  It  was felt that this vertigo would be stable on p.r.n. Antivert, which the  patient is given a prescription for.  I note that this medicine was not  mentioned in the patient's discharge medications.  #2.  In regard to patient's iron-deficiency anemia, as mentioned in the  initial illness, she had a low decreased hemoglobin and hematocrit.  Iron  studies showed a markedly low iron and ferritin level.  Dr. Madilyn Fireman from  gastroenterology was consulted after the patient's noted ferritin level was  6.  He agreed, and proceeded with an EGD, which was done on October 20, 2003.  At that time, the patient was found to have a hiatal hernia, as well as a  ring, some shallow ulcers with mild erosion, and some gastroenteritis.  CLO  studies were ordered to look for H. pylori, and these were negative.  Overall, the EGD was unremarkable for any type of active bleeding site.  The  colonoscopy was planned for the following day, and she received a  colonoscopy on October 21, 2003.  This was found to be essentially normal,  although the final report is pending.  Plan is per the recommendations of  the gastroenterology  group for the patient to be discharged to home and  continue on Protonix twice a day for 1 month, and the Protonix daily.  In  addition, she will also be on iron supplements 325 mg p.o. twice a day.  #3.  In regard to patient's hypertension, initially her blood pressure  medications were held secondary to the cause of her vertigo symptoms.  It  was able to be restarted.  She had some slightly elevated blood pressures  during her hospital stay, but now that she is able to resume her  medications, her blood pressure was found to be stable, with no evidence of  further issues.  Her blood pressure on the day of discharge was found to be  160/67, with a heart rate of 64.  Some consideration may be given for the  patient's primary care physician to have an additional blood pressure agent,  given that her heart rate is close to the low end of normal, but she still  has elevated blood pressures.   DISPOSITION:  The patient is being discharged to home.  Her disposition is  improved.   ACTIVITY:  She is advised to take it easy for the next few days, and return  to work no earlier than October 26, 2003.   FOLLOW UP:  1. She will follow up with her primary care physician, Dr. Dayton Scrape, in the     next 2 weeks.  2. She will follow up with [facility], Dr. Madilyn Fireman, in 2 months.   DIET:  She is being discharged on a low sodium diet.                                               Hollice Espy, M.D.    SKK/MEDQ  D:  10/21/2003  T:  10/21/2003  Job:  [id number]   cc:   Everardo All. Madilyn Fireman, M.D.  [address], [address]  [place]  [place] [address]  Fax: [phone]   Meredith Staggers, M.D.  [address], [address]  [place]  [place] [address]  Fax: [phone]

## 2010-11-18 NOTE — H&P (Signed)
NAME:  [name], [name]                      ACCOUNT NO.:  [id number]   MEDICAL RECORD NO.:  [id number]                   PATIENT TYPE:  INP   LOCATION:  0363                                 FACILITY:  [facility]   PHYSICIAN:  Charlaine Dalton. Sherene Sires, M.D. [facility]           DATE OF BIRTH:  02-28-1941   DATE OF ADMISSION:  08/13/2002  DATE OF DISCHARGE:                                HISTORY & PHYSICAL   CHIEF COMPLAINT:  Chest pain.   HISTORY:  This is a 70 year old white female, never smoker, presenting with  chest pain that started about three weeks ago, initially in the left upper  anterior chest.  She was initially felt to have costochondritis and noted  that the pain was worse with breathing.  Subsequently, she was felt to have  pneumonia and treated with an appropriate course of antibiotics, but then  developed bright red hemoptysis 10 days ago, lasting for several days, with  several tablespoons of blood coughed up.  She now has pain remotely in the  right lower chest posteriorly that is more painful when she tries to lie  down.  CT scan obtained yesterday revealed pleural effusion and nodular  density in the right lower lobe, and therefore I was asked to see her.   The patient has a history of phlebitis relating to childbirth for years, and  has had mild asymmetric leg swelling since that time.  She remembers having  a knot in one of her legs several weeks ago, but says that it went away  on its own.  She has already been tried on nonsteroidals for her pain with  no apparent benefit.  She has had low-grade fever and chills, but no  purulent sputum as noted above.  No sinus or sore throat, other aches and  pains, nausea, vomiting, orthopnea, PND, or leg swelling.  She does complain  of dyspnea that has worsened over the last several weeks since the onset of  her chest pain to the point where she gets short of breath just talking.   PAST MEDICAL HISTORY:  Significant for remote  appendectomy, tonsillectomy,  back surgery, and carpal tunnel surgery.  She also carries a diagnosis of  hypertension.   ALLERGIES:  DARVOCET-N 100 AND IVP DYE (SHE IS ANAPHYLACTIC APPARENTLY).   MEDICATIONS:  1. Toprol XL 30 mg daily.  2. Hydrochlorothiazide 25 mg daily.  3. Ranitidine 100 mg b.i.d.  4. Alprazolam 0.25 mg q.h.s.   SOCIAL HISTORY:  She has never smoked.  She worked in [employer].  She has no  unusual travel, pet, or hobby exposure.   FAMILY HISTORY:  Positive for emphysema in her father who smoked.  Heart  disease in her father and mother.  Negative for clotting disorders or  collagen vascular diseases to her knowledge.   REVIEW OF SYSTEMS:  Taken in detail on the worksheet, and essentially  negative except as noted above, except for symptomatic reflux  over the last  two months.   PHYSICAL EXAMINATION:  GENERAL:  This is an anxious white female in no acute  distress.  VITAL SIGNS:  She has stable vital signs.  HEENT:  Unremarkable.  Dentition is intact.  Nasopharynx is clear.  Auditory  canals are clear.  NECK:  Supple without cervical adenopathy or tenderness.  Trachea is midline  without thyromegaly.  LUNGS:  Diminished breath sounds at the right base.  Overall air movement,  however, was adequate.  HEART:  Regular rhythm without murmurs, gallops or rubs.  __________  ABDOMEN:  Soft, benign.  EXTREMITIES:  Warm without calf tenderness, cyanosis, clubbing, or edema.  NEUROLOGIC:  No focal deficits.  SKIN:  Warm and dry.   Saturations 97% on room air.   I had available a chest CT scan from [facility] dated 08/06/02 which  revealed decreased aeration of the right base and a small right pleural  effusion along with a large hiatal hernia.  There was a 10 mm nodular sized  density within the left lower lobe, as well,  in a subpleural location.   IMPRESSION:  Migrating intensely pleuritic chest pain, initially left  anterior, now right posterior,  associated with hemoptysis with no history of  any purulent sputum, and associated with a right pleural effusion, and is  almost pathognomonic at this point in retrospect for pulmonary embolism.  The CT scan that was done was not done with a PE protocol, and the amount of  contrast in the pulmonary artery is quite low.  I discussed this issue with  Dr. Ronney Asters, who reviewed her CT scan from the work station, and he  agreed that this study that they had done was not adequate to rule out  pulmonary embolism.  The differential diagnosis includes collagen vascular  disease (especially lupus since it is associated with hemoptysis).  Unfortunately it also includes malignancy, which is a bit more worrisome,  given the fact that the onset of the illness was not as abrupt as one would  normally suspect in pulmonary embolism, but pulmonary embolism remains the  primarily diagnosis until proven otherwise, given the pattern of pain, the  hemoptysis, and the failure to respond to appropriate treatment for  costochondritis for nonsteroidals, as well as pneumonia with antibiotics.                                               Charlaine Dalton. Sherene Sires, M.D. [facility]    MBW/MEDQ  D:  08/13/2002  T:  08/13/2002  Job:  [id number]   cc:   Tama Headings. Marina Goodell, M.D.  [address]  [employer] [place]  [place] [address]  Fax: [phone]

## 2011-03-27 LAB — CBC
HCT: 29.8 — ABNORMAL LOW
Hemoglobin: 9.4 — ABNORMAL LOW
MCHC: 31.5
MCV: 73.2 — ABNORMAL LOW
RBC: 4.07

## 2011-03-27 LAB — DIFFERENTIAL
Basophils Relative: 1
Eosinophils Absolute: 0.1
Eosinophils Relative: 1
Lymphs Abs: 1.3
Monocytes Absolute: 0.6
Monocytes Relative: 11
Neutrophils Relative %: 60

## 2011-03-27 LAB — BASIC METABOLIC PANEL
Calcium: 8.9
GFR calc Af Amer: >60
GFR calc non Af Amer: 59 — ABNORMAL LOW
Sodium: 137

## 2011-04-20 LAB — CBC
HCT: 27.5 — ABNORMAL LOW
HCT: 28.8 — ABNORMAL LOW
HCT: 29.5 — ABNORMAL LOW
Hemoglobin: 9.8 — ABNORMAL LOW
MCHC: 33.3
MCHC: 33.7
MCV: 79.6
MCV: 80.4
MCV: 81.2
Platelets: 193
RBC: 3.42 — ABNORMAL LOW
RBC: 3.62 — ABNORMAL LOW
RBC: 3.63 — ABNORMAL LOW
WBC: 5.7
WBC: 6
WBC: 7.4

## 2011-04-20 LAB — BASIC METABOLIC PANEL
CO2: 27
Chloride: 101
Creatinine, Ser: 0.85
GFR calc Af Amer: >60
Potassium: 3.6

## 2011-04-20 LAB — FERRITIN: Ferritin: 9 — ABNORMAL LOW (ref 10–291)

## 2011-04-20 LAB — IRON AND TIBC
Iron: 16 — ABNORMAL LOW
TIBC: 351

## 2011-04-20 LAB — PROTIME-INR
INR: 1.8 — ABNORMAL HIGH
INR: 1.9 — ABNORMAL HIGH
Prothrombin Time: 21.6 — ABNORMAL HIGH
Prothrombin Time: 22.4 — ABNORMAL HIGH

## 2011-04-20 LAB — OCCULT BLOOD X 1 CARD TO LAB, STOOL
Fecal Occult Bld: POSITIVE
Fecal Occult Bld: POSITIVE

## 2011-04-20 LAB — CANCER ANTIGEN 19-9: CA 19-9: 6.5 — ABNORMAL LOW (ref <35.0)

## 2011-04-20 LAB — AFP TUMOR MARKER: AFP-Tumor Marker: 2.4

## 2011-04-20 LAB — HEMOGLOBIN AND HEMATOCRIT, BLOOD: HCT: 27.8 — ABNORMAL LOW

## 2011-07-04 DIAGNOSIS — IMO0002 Reserved for concepts with insufficient information to code with codable children: Secondary | ICD-10-CM

## 2011-07-04 HISTORY — DX: Reserved for concepts with insufficient information to code with codable children: IMO0002

## 2011-07-05 DIAGNOSIS — Z9849 Cataract extraction status, unspecified eye: Secondary | ICD-10-CM | POA: Diagnosis not present

## 2011-07-05 DIAGNOSIS — E785 Hyperlipidemia, unspecified: Secondary | ICD-10-CM | POA: Diagnosis present

## 2011-07-05 DIAGNOSIS — I2510 Atherosclerotic heart disease of native coronary artery without angina pectoris: Secondary | ICD-10-CM | POA: Diagnosis not present

## 2011-07-05 DIAGNOSIS — I120 Hypertensive chronic kidney disease with stage 5 chronic kidney disease or end stage renal disease: Secondary | ICD-10-CM | POA: Diagnosis not present

## 2011-07-05 DIAGNOSIS — N183 Chronic kidney disease, stage 3 unspecified: Secondary | ICD-10-CM | POA: Diagnosis not present

## 2011-07-05 DIAGNOSIS — I10 Essential (primary) hypertension: Secondary | ICD-10-CM | POA: Diagnosis not present

## 2011-07-05 DIAGNOSIS — M25519 Pain in unspecified shoulder: Secondary | ICD-10-CM | POA: Diagnosis not present

## 2011-07-05 DIAGNOSIS — N179 Acute kidney failure, unspecified: Secondary | ICD-10-CM | POA: Diagnosis not present

## 2011-07-05 DIAGNOSIS — Z7901 Long term (current) use of anticoagulants: Secondary | ICD-10-CM | POA: Diagnosis not present

## 2011-07-05 DIAGNOSIS — I4891 Unspecified atrial fibrillation: Secondary | ICD-10-CM | POA: Diagnosis not present

## 2011-07-05 DIAGNOSIS — I959 Hypotension, unspecified: Secondary | ICD-10-CM | POA: Diagnosis not present

## 2011-07-05 DIAGNOSIS — N182 Chronic kidney disease, stage 2 (mild): Secondary | ICD-10-CM | POA: Diagnosis not present

## 2011-07-05 DIAGNOSIS — I5030 Unspecified diastolic (congestive) heart failure: Secondary | ICD-10-CM | POA: Diagnosis not present

## 2011-07-05 DIAGNOSIS — Z2821 Immunization not carried out because of patient refusal: Secondary | ICD-10-CM | POA: Diagnosis not present

## 2011-07-05 DIAGNOSIS — Z8249 Family history of ischemic heart disease and other diseases of the circulatory system: Secondary | ICD-10-CM | POA: Diagnosis not present

## 2011-07-05 DIAGNOSIS — I5022 Chronic systolic (congestive) heart failure: Secondary | ICD-10-CM | POA: Diagnosis not present

## 2011-07-05 DIAGNOSIS — N289 Disorder of kidney and ureter, unspecified: Secondary | ICD-10-CM | POA: Diagnosis not present

## 2011-07-05 DIAGNOSIS — K219 Gastro-esophageal reflux disease without esophagitis: Secondary | ICD-10-CM | POA: Diagnosis present

## 2011-07-05 DIAGNOSIS — R9430 Abnormal result of cardiovascular function study, unspecified: Secondary | ICD-10-CM | POA: Diagnosis not present

## 2011-07-05 DIAGNOSIS — Z5309 Procedure and treatment not carried out because of other contraindication: Secondary | ICD-10-CM | POA: Diagnosis not present

## 2011-07-05 DIAGNOSIS — R209 Unspecified disturbances of skin sensation: Secondary | ICD-10-CM | POA: Diagnosis not present

## 2011-07-05 DIAGNOSIS — I129 Hypertensive chronic kidney disease with stage 1 through stage 4 chronic kidney disease, or unspecified chronic kidney disease: Secondary | ICD-10-CM | POA: Diagnosis present

## 2011-07-05 DIAGNOSIS — F329 Major depressive disorder, single episode, unspecified: Secondary | ICD-10-CM | POA: Diagnosis present

## 2011-07-05 DIAGNOSIS — I509 Heart failure, unspecified: Secondary | ICD-10-CM | POA: Diagnosis not present

## 2011-07-05 DIAGNOSIS — R079 Chest pain, unspecified: Secondary | ICD-10-CM | POA: Diagnosis not present

## 2011-07-05 DIAGNOSIS — F3289 Other specified depressive episodes: Secondary | ICD-10-CM | POA: Diagnosis present

## 2011-07-05 DIAGNOSIS — Z86711 Personal history of pulmonary embolism: Secondary | ICD-10-CM | POA: Diagnosis not present

## 2011-07-05 DIAGNOSIS — Z8673 Personal history of transient ischemic attack (TIA), and cerebral infarction without residual deficits: Secondary | ICD-10-CM | POA: Diagnosis not present

## 2011-07-21 DIAGNOSIS — Z7901 Long term (current) use of anticoagulants: Secondary | ICD-10-CM | POA: Diagnosis not present

## 2011-07-21 DIAGNOSIS — I4891 Unspecified atrial fibrillation: Secondary | ICD-10-CM | POA: Diagnosis not present

## 2011-07-25 DIAGNOSIS — Z86711 Personal history of pulmonary embolism: Secondary | ICD-10-CM | POA: Diagnosis not present

## 2011-07-25 DIAGNOSIS — I10 Essential (primary) hypertension: Secondary | ICD-10-CM | POA: Diagnosis not present

## 2011-07-25 DIAGNOSIS — N179 Acute kidney failure, unspecified: Secondary | ICD-10-CM | POA: Diagnosis not present

## 2011-07-25 DIAGNOSIS — J209 Acute bronchitis, unspecified: Secondary | ICD-10-CM | POA: Diagnosis not present

## 2011-07-25 DIAGNOSIS — N390 Urinary tract infection, site not specified: Secondary | ICD-10-CM | POA: Diagnosis not present

## 2011-07-26 DIAGNOSIS — R319 Hematuria, unspecified: Secondary | ICD-10-CM | POA: Diagnosis not present

## 2011-07-26 DIAGNOSIS — K219 Gastro-esophageal reflux disease without esophagitis: Secondary | ICD-10-CM | POA: Diagnosis not present

## 2011-07-26 DIAGNOSIS — R059 Cough, unspecified: Secondary | ICD-10-CM | POA: Diagnosis not present

## 2011-07-26 DIAGNOSIS — R072 Precordial pain: Secondary | ICD-10-CM | POA: Diagnosis not present

## 2011-07-26 DIAGNOSIS — E785 Hyperlipidemia, unspecified: Secondary | ICD-10-CM | POA: Diagnosis not present

## 2011-07-26 DIAGNOSIS — I2782 Chronic pulmonary embolism: Secondary | ICD-10-CM | POA: Diagnosis not present

## 2011-07-26 DIAGNOSIS — I4891 Unspecified atrial fibrillation: Secondary | ICD-10-CM | POA: Diagnosis not present

## 2011-07-26 DIAGNOSIS — R05 Cough: Secondary | ICD-10-CM | POA: Diagnosis not present

## 2011-07-26 DIAGNOSIS — I10 Essential (primary) hypertension: Secondary | ICD-10-CM | POA: Diagnosis not present

## 2011-07-26 DIAGNOSIS — J209 Acute bronchitis, unspecified: Secondary | ICD-10-CM | POA: Diagnosis not present

## 2011-07-28 DIAGNOSIS — I4891 Unspecified atrial fibrillation: Secondary | ICD-10-CM | POA: Diagnosis not present

## 2011-07-28 DIAGNOSIS — Z7901 Long term (current) use of anticoagulants: Secondary | ICD-10-CM | POA: Diagnosis not present

## 2011-08-01 DIAGNOSIS — R079 Chest pain, unspecified: Secondary | ICD-10-CM | POA: Diagnosis not present

## 2011-08-01 DIAGNOSIS — Z1211 Encounter for screening for malignant neoplasm of colon: Secondary | ICD-10-CM | POA: Diagnosis not present

## 2011-08-01 DIAGNOSIS — I4891 Unspecified atrial fibrillation: Secondary | ICD-10-CM | POA: Diagnosis not present

## 2011-08-01 DIAGNOSIS — K219 Gastro-esophageal reflux disease without esophagitis: Secondary | ICD-10-CM | POA: Diagnosis not present

## 2011-08-01 DIAGNOSIS — I10 Essential (primary) hypertension: Secondary | ICD-10-CM | POA: Diagnosis not present

## 2011-08-01 DIAGNOSIS — I2510 Atherosclerotic heart disease of native coronary artery without angina pectoris: Secondary | ICD-10-CM | POA: Diagnosis not present

## 2011-08-01 DIAGNOSIS — I509 Heart failure, unspecified: Secondary | ICD-10-CM | POA: Diagnosis not present

## 2011-08-11 DIAGNOSIS — I10 Essential (primary) hypertension: Secondary | ICD-10-CM | POA: Diagnosis not present

## 2011-08-16 DIAGNOSIS — Z7901 Long term (current) use of anticoagulants: Secondary | ICD-10-CM | POA: Diagnosis not present

## 2011-08-16 DIAGNOSIS — I4891 Unspecified atrial fibrillation: Secondary | ICD-10-CM | POA: Diagnosis not present

## 2011-08-16 DIAGNOSIS — R7309 Other abnormal glucose: Secondary | ICD-10-CM | POA: Diagnosis not present

## 2011-08-16 DIAGNOSIS — F411 Generalized anxiety disorder: Secondary | ICD-10-CM | POA: Diagnosis not present

## 2011-09-14 DIAGNOSIS — Z7901 Long term (current) use of anticoagulants: Secondary | ICD-10-CM | POA: Diagnosis not present

## 2011-09-14 DIAGNOSIS — I4891 Unspecified atrial fibrillation: Secondary | ICD-10-CM | POA: Diagnosis not present

## 2011-10-16 DIAGNOSIS — Z7901 Long term (current) use of anticoagulants: Secondary | ICD-10-CM | POA: Diagnosis not present

## 2011-10-16 DIAGNOSIS — I4891 Unspecified atrial fibrillation: Secondary | ICD-10-CM | POA: Diagnosis not present

## 2011-10-18 DIAGNOSIS — R82998 Other abnormal findings in urine: Secondary | ICD-10-CM | POA: Diagnosis not present

## 2011-10-18 DIAGNOSIS — Z1231 Encounter for screening mammogram for malignant neoplasm of breast: Secondary | ICD-10-CM | POA: Diagnosis not present

## 2011-10-18 DIAGNOSIS — N812 Incomplete uterovaginal prolapse: Secondary | ICD-10-CM | POA: Diagnosis not present

## 2011-10-18 DIAGNOSIS — T192XXA Foreign body in vulva and vagina, initial encounter: Secondary | ICD-10-CM | POA: Diagnosis not present

## 2011-10-23 DIAGNOSIS — Z7901 Long term (current) use of anticoagulants: Secondary | ICD-10-CM | POA: Diagnosis not present

## 2011-10-23 DIAGNOSIS — I4891 Unspecified atrial fibrillation: Secondary | ICD-10-CM | POA: Diagnosis not present

## 2011-10-24 DIAGNOSIS — E785 Hyperlipidemia, unspecified: Secondary | ICD-10-CM | POA: Diagnosis not present

## 2011-10-24 DIAGNOSIS — I2782 Chronic pulmonary embolism: Secondary | ICD-10-CM | POA: Diagnosis not present

## 2011-10-24 DIAGNOSIS — I4891 Unspecified atrial fibrillation: Secondary | ICD-10-CM | POA: Diagnosis not present

## 2011-10-24 DIAGNOSIS — K219 Gastro-esophageal reflux disease without esophagitis: Secondary | ICD-10-CM | POA: Diagnosis not present

## 2011-10-24 DIAGNOSIS — R319 Hematuria, unspecified: Secondary | ICD-10-CM | POA: Diagnosis not present

## 2011-10-24 DIAGNOSIS — Z7901 Long term (current) use of anticoagulants: Secondary | ICD-10-CM | POA: Diagnosis not present

## 2011-10-24 DIAGNOSIS — I10 Essential (primary) hypertension: Secondary | ICD-10-CM | POA: Diagnosis not present

## 2011-10-24 DIAGNOSIS — R072 Precordial pain: Secondary | ICD-10-CM | POA: Diagnosis not present

## 2011-11-06 DIAGNOSIS — Z7901 Long term (current) use of anticoagulants: Secondary | ICD-10-CM | POA: Diagnosis not present

## 2011-11-06 DIAGNOSIS — I4891 Unspecified atrial fibrillation: Secondary | ICD-10-CM | POA: Diagnosis not present

## 2011-11-10 DIAGNOSIS — R7309 Other abnormal glucose: Secondary | ICD-10-CM | POA: Diagnosis not present

## 2011-11-10 DIAGNOSIS — E785 Hyperlipidemia, unspecified: Secondary | ICD-10-CM | POA: Diagnosis not present

## 2011-11-10 DIAGNOSIS — I10 Essential (primary) hypertension: Secondary | ICD-10-CM | POA: Diagnosis not present

## 2011-11-23 DIAGNOSIS — Z7901 Long term (current) use of anticoagulants: Secondary | ICD-10-CM | POA: Diagnosis not present

## 2011-11-23 DIAGNOSIS — I4891 Unspecified atrial fibrillation: Secondary | ICD-10-CM | POA: Diagnosis not present

## 2011-11-24 DIAGNOSIS — F411 Generalized anxiety disorder: Secondary | ICD-10-CM | POA: Diagnosis not present

## 2011-11-24 DIAGNOSIS — I4891 Unspecified atrial fibrillation: Secondary | ICD-10-CM | POA: Diagnosis not present

## 2012-02-09 DIAGNOSIS — S32009A Unspecified fracture of unspecified lumbar vertebra, initial encounter for closed fracture: Secondary | ICD-10-CM | POA: Diagnosis not present

## 2012-02-09 DIAGNOSIS — M545 Low back pain, unspecified: Secondary | ICD-10-CM | POA: Diagnosis not present

## 2012-02-09 DIAGNOSIS — S199XXA Unspecified injury of neck, initial encounter: Secondary | ICD-10-CM | POA: Diagnosis not present

## 2012-02-09 DIAGNOSIS — S0993XA Unspecified injury of face, initial encounter: Secondary | ICD-10-CM | POA: Diagnosis not present

## 2012-02-10 DIAGNOSIS — S79919A Unspecified injury of unspecified hip, initial encounter: Secondary | ICD-10-CM | POA: Diagnosis not present

## 2012-02-10 DIAGNOSIS — Z9889 Other specified postprocedural states: Secondary | ICD-10-CM | POA: Diagnosis not present

## 2012-02-10 DIAGNOSIS — IMO0002 Reserved for concepts with insufficient information to code with codable children: Secondary | ICD-10-CM | POA: Diagnosis not present

## 2012-02-10 DIAGNOSIS — M549 Dorsalgia, unspecified: Secondary | ICD-10-CM | POA: Diagnosis not present

## 2012-02-10 DIAGNOSIS — M25559 Pain in unspecified hip: Secondary | ICD-10-CM | POA: Diagnosis not present

## 2012-02-10 DIAGNOSIS — S32009A Unspecified fracture of unspecified lumbar vertebra, initial encounter for closed fracture: Secondary | ICD-10-CM | POA: Diagnosis not present

## 2012-02-10 DIAGNOSIS — M546 Pain in thoracic spine: Secondary | ICD-10-CM | POA: Diagnosis not present

## 2012-02-11 DIAGNOSIS — F329 Major depressive disorder, single episode, unspecified: Secondary | ICD-10-CM | POA: Diagnosis present

## 2012-02-11 DIAGNOSIS — M546 Pain in thoracic spine: Secondary | ICD-10-CM | POA: Diagnosis not present

## 2012-02-11 DIAGNOSIS — I4891 Unspecified atrial fibrillation: Secondary | ICD-10-CM | POA: Diagnosis not present

## 2012-02-11 DIAGNOSIS — M545 Low back pain, unspecified: Secondary | ICD-10-CM | POA: Diagnosis not present

## 2012-02-11 DIAGNOSIS — I6350 Cerebral infarction due to unspecified occlusion or stenosis of unspecified cerebral artery: Secondary | ICD-10-CM | POA: Diagnosis not present

## 2012-02-11 DIAGNOSIS — R279 Unspecified lack of coordination: Secondary | ICD-10-CM | POA: Diagnosis not present

## 2012-02-11 DIAGNOSIS — R5381 Other malaise: Secondary | ICD-10-CM | POA: Diagnosis not present

## 2012-02-11 DIAGNOSIS — K219 Gastro-esophageal reflux disease without esophagitis: Secondary | ICD-10-CM | POA: Diagnosis not present

## 2012-02-11 DIAGNOSIS — F3289 Other specified depressive episodes: Secondary | ICD-10-CM | POA: Diagnosis not present

## 2012-02-11 DIAGNOSIS — Z5189 Encounter for other specified aftercare: Secondary | ICD-10-CM | POA: Diagnosis not present

## 2012-02-11 DIAGNOSIS — S32009A Unspecified fracture of unspecified lumbar vertebra, initial encounter for closed fracture: Secondary | ICD-10-CM | POA: Diagnosis not present

## 2012-02-11 DIAGNOSIS — E785 Hyperlipidemia, unspecified: Secondary | ICD-10-CM | POA: Diagnosis not present

## 2012-02-11 DIAGNOSIS — IMO0002 Reserved for concepts with insufficient information to code with codable children: Secondary | ICD-10-CM | POA: Diagnosis not present

## 2012-02-11 DIAGNOSIS — I10 Essential (primary) hypertension: Secondary | ICD-10-CM | POA: Diagnosis not present

## 2012-02-11 DIAGNOSIS — K5900 Constipation, unspecified: Secondary | ICD-10-CM | POA: Diagnosis not present

## 2012-02-11 DIAGNOSIS — I69959 Hemiplegia and hemiparesis following unspecified cerebrovascular disease affecting unspecified side: Secondary | ICD-10-CM | POA: Diagnosis not present

## 2012-02-11 DIAGNOSIS — Z7901 Long term (current) use of anticoagulants: Secondary | ICD-10-CM | POA: Diagnosis not present

## 2012-02-11 DIAGNOSIS — M6281 Muscle weakness (generalized): Secondary | ICD-10-CM | POA: Diagnosis not present

## 2012-02-11 DIAGNOSIS — R41841 Cognitive communication deficit: Secondary | ICD-10-CM | POA: Diagnosis not present

## 2012-02-11 DIAGNOSIS — R262 Difficulty in walking, not elsewhere classified: Secondary | ICD-10-CM | POA: Diagnosis not present

## 2012-02-11 DIAGNOSIS — M549 Dorsalgia, unspecified: Secondary | ICD-10-CM | POA: Diagnosis not present

## 2012-02-19 DIAGNOSIS — R5381 Other malaise: Secondary | ICD-10-CM | POA: Diagnosis not present

## 2012-02-19 DIAGNOSIS — Z5189 Encounter for other specified aftercare: Secondary | ICD-10-CM | POA: Diagnosis not present

## 2012-02-19 DIAGNOSIS — F329 Major depressive disorder, single episode, unspecified: Secondary | ICD-10-CM | POA: Diagnosis not present

## 2012-02-19 DIAGNOSIS — F3289 Other specified depressive episodes: Secondary | ICD-10-CM | POA: Diagnosis not present

## 2012-02-19 DIAGNOSIS — I10 Essential (primary) hypertension: Secondary | ICD-10-CM | POA: Diagnosis not present

## 2012-02-19 DIAGNOSIS — I69959 Hemiplegia and hemiparesis following unspecified cerebrovascular disease affecting unspecified side: Secondary | ICD-10-CM | POA: Diagnosis not present

## 2012-02-19 DIAGNOSIS — M545 Low back pain, unspecified: Secondary | ICD-10-CM | POA: Diagnosis not present

## 2012-02-19 DIAGNOSIS — R262 Difficulty in walking, not elsewhere classified: Secondary | ICD-10-CM | POA: Diagnosis not present

## 2012-02-19 DIAGNOSIS — R279 Unspecified lack of coordination: Secondary | ICD-10-CM | POA: Diagnosis not present

## 2012-02-19 DIAGNOSIS — I4891 Unspecified atrial fibrillation: Secondary | ICD-10-CM | POA: Diagnosis not present

## 2012-02-19 DIAGNOSIS — R41841 Cognitive communication deficit: Secondary | ICD-10-CM | POA: Diagnosis not present

## 2012-02-19 DIAGNOSIS — K5900 Constipation, unspecified: Secondary | ICD-10-CM | POA: Diagnosis not present

## 2012-02-19 DIAGNOSIS — IMO0002 Reserved for concepts with insufficient information to code with codable children: Secondary | ICD-10-CM | POA: Diagnosis not present

## 2012-02-19 DIAGNOSIS — M6281 Muscle weakness (generalized): Secondary | ICD-10-CM | POA: Diagnosis not present

## 2012-02-20 DIAGNOSIS — I4891 Unspecified atrial fibrillation: Secondary | ICD-10-CM | POA: Diagnosis not present

## 2012-02-20 DIAGNOSIS — M545 Low back pain, unspecified: Secondary | ICD-10-CM | POA: Diagnosis not present

## 2012-02-20 DIAGNOSIS — K5900 Constipation, unspecified: Secondary | ICD-10-CM | POA: Diagnosis not present

## 2012-02-20 DIAGNOSIS — I10 Essential (primary) hypertension: Secondary | ICD-10-CM | POA: Diagnosis not present

## 2012-03-12 DIAGNOSIS — I10 Essential (primary) hypertension: Secondary | ICD-10-CM | POA: Diagnosis not present

## 2012-03-12 DIAGNOSIS — R42 Dizziness and giddiness: Secondary | ICD-10-CM | POA: Diagnosis not present

## 2012-03-12 DIAGNOSIS — M545 Low back pain, unspecified: Secondary | ICD-10-CM | POA: Diagnosis not present

## 2012-03-12 DIAGNOSIS — E782 Mixed hyperlipidemia: Secondary | ICD-10-CM | POA: Diagnosis not present

## 2012-03-12 DIAGNOSIS — K219 Gastro-esophageal reflux disease without esophagitis: Secondary | ICD-10-CM | POA: Diagnosis not present

## 2012-03-12 DIAGNOSIS — R197 Diarrhea, unspecified: Secondary | ICD-10-CM | POA: Diagnosis not present

## 2012-03-12 DIAGNOSIS — Z86718 Personal history of other venous thrombosis and embolism: Secondary | ICD-10-CM | POA: Diagnosis not present

## 2012-03-26 DIAGNOSIS — M545 Low back pain, unspecified: Secondary | ICD-10-CM | POA: Diagnosis not present

## 2012-03-26 DIAGNOSIS — S335XXA Sprain of ligaments of lumbar spine, initial encounter: Secondary | ICD-10-CM | POA: Diagnosis not present

## 2012-03-26 DIAGNOSIS — M4310 Spondylolisthesis, site unspecified: Secondary | ICD-10-CM | POA: Diagnosis not present

## 2012-03-26 DIAGNOSIS — M25559 Pain in unspecified hip: Secondary | ICD-10-CM | POA: Diagnosis not present

## 2012-03-26 DIAGNOSIS — M47817 Spondylosis without myelopathy or radiculopathy, lumbosacral region: Secondary | ICD-10-CM | POA: Diagnosis not present

## 2012-03-26 DIAGNOSIS — M5137 Other intervertebral disc degeneration, lumbosacral region: Secondary | ICD-10-CM | POA: Diagnosis not present

## 2012-03-26 DIAGNOSIS — M76899 Other specified enthesopathies of unspecified lower limb, excluding foot: Secondary | ICD-10-CM | POA: Diagnosis not present

## 2012-04-01 DIAGNOSIS — R262 Difficulty in walking, not elsewhere classified: Secondary | ICD-10-CM | POA: Diagnosis not present

## 2012-04-01 DIAGNOSIS — I10 Essential (primary) hypertension: Secondary | ICD-10-CM | POA: Diagnosis not present

## 2012-04-01 DIAGNOSIS — I4891 Unspecified atrial fibrillation: Secondary | ICD-10-CM | POA: Diagnosis not present

## 2012-04-01 DIAGNOSIS — M545 Low back pain, unspecified: Secondary | ICD-10-CM | POA: Diagnosis not present

## 2012-04-01 DIAGNOSIS — IMO0001 Reserved for inherently not codable concepts without codable children: Secondary | ICD-10-CM | POA: Diagnosis not present

## 2012-04-08 DIAGNOSIS — M545 Low back pain, unspecified: Secondary | ICD-10-CM | POA: Diagnosis not present

## 2012-04-08 DIAGNOSIS — IMO0001 Reserved for inherently not codable concepts without codable children: Secondary | ICD-10-CM | POA: Diagnosis not present

## 2012-04-08 DIAGNOSIS — I4891 Unspecified atrial fibrillation: Secondary | ICD-10-CM | POA: Diagnosis not present

## 2012-04-08 DIAGNOSIS — R262 Difficulty in walking, not elsewhere classified: Secondary | ICD-10-CM | POA: Diagnosis not present

## 2012-04-08 DIAGNOSIS — I10 Essential (primary) hypertension: Secondary | ICD-10-CM | POA: Diagnosis not present

## 2012-04-09 DIAGNOSIS — I10 Essential (primary) hypertension: Secondary | ICD-10-CM | POA: Diagnosis not present

## 2012-04-09 DIAGNOSIS — I4891 Unspecified atrial fibrillation: Secondary | ICD-10-CM | POA: Diagnosis not present

## 2012-04-09 DIAGNOSIS — I6350 Cerebral infarction due to unspecified occlusion or stenosis of unspecified cerebral artery: Secondary | ICD-10-CM | POA: Diagnosis not present

## 2012-04-09 DIAGNOSIS — R51 Headache: Secondary | ICD-10-CM | POA: Diagnosis not present

## 2012-04-09 DIAGNOSIS — M545 Low back pain, unspecified: Secondary | ICD-10-CM | POA: Diagnosis not present

## 2012-04-10 DIAGNOSIS — M545 Low back pain, unspecified: Secondary | ICD-10-CM | POA: Diagnosis not present

## 2012-04-10 DIAGNOSIS — I4891 Unspecified atrial fibrillation: Secondary | ICD-10-CM | POA: Diagnosis not present

## 2012-04-10 DIAGNOSIS — IMO0001 Reserved for inherently not codable concepts without codable children: Secondary | ICD-10-CM | POA: Diagnosis not present

## 2012-04-10 DIAGNOSIS — I10 Essential (primary) hypertension: Secondary | ICD-10-CM | POA: Diagnosis not present

## 2012-04-10 DIAGNOSIS — R262 Difficulty in walking, not elsewhere classified: Secondary | ICD-10-CM | POA: Diagnosis not present

## 2012-04-12 DIAGNOSIS — I4891 Unspecified atrial fibrillation: Secondary | ICD-10-CM | POA: Diagnosis not present

## 2012-04-12 DIAGNOSIS — R262 Difficulty in walking, not elsewhere classified: Secondary | ICD-10-CM | POA: Diagnosis not present

## 2012-04-12 DIAGNOSIS — IMO0001 Reserved for inherently not codable concepts without codable children: Secondary | ICD-10-CM | POA: Diagnosis not present

## 2012-04-12 DIAGNOSIS — M545 Low back pain, unspecified: Secondary | ICD-10-CM | POA: Diagnosis not present

## 2012-04-12 DIAGNOSIS — I10 Essential (primary) hypertension: Secondary | ICD-10-CM | POA: Diagnosis not present

## 2012-04-15 DIAGNOSIS — M545 Low back pain, unspecified: Secondary | ICD-10-CM | POA: Diagnosis not present

## 2012-04-15 DIAGNOSIS — I10 Essential (primary) hypertension: Secondary | ICD-10-CM | POA: Diagnosis not present

## 2012-04-15 DIAGNOSIS — R262 Difficulty in walking, not elsewhere classified: Secondary | ICD-10-CM | POA: Diagnosis not present

## 2012-04-15 DIAGNOSIS — IMO0001 Reserved for inherently not codable concepts without codable children: Secondary | ICD-10-CM | POA: Diagnosis not present

## 2012-04-15 DIAGNOSIS — I4891 Unspecified atrial fibrillation: Secondary | ICD-10-CM | POA: Diagnosis not present

## 2012-04-17 DIAGNOSIS — IMO0001 Reserved for inherently not codable concepts without codable children: Secondary | ICD-10-CM | POA: Diagnosis not present

## 2012-04-17 DIAGNOSIS — R262 Difficulty in walking, not elsewhere classified: Secondary | ICD-10-CM | POA: Diagnosis not present

## 2012-04-17 DIAGNOSIS — I4891 Unspecified atrial fibrillation: Secondary | ICD-10-CM | POA: Diagnosis not present

## 2012-04-17 DIAGNOSIS — I10 Essential (primary) hypertension: Secondary | ICD-10-CM | POA: Diagnosis not present

## 2012-04-17 DIAGNOSIS — M545 Low back pain, unspecified: Secondary | ICD-10-CM | POA: Diagnosis not present

## 2012-04-19 DIAGNOSIS — I10 Essential (primary) hypertension: Secondary | ICD-10-CM | POA: Diagnosis not present

## 2012-04-19 DIAGNOSIS — M545 Low back pain, unspecified: Secondary | ICD-10-CM | POA: Diagnosis not present

## 2012-04-19 DIAGNOSIS — IMO0001 Reserved for inherently not codable concepts without codable children: Secondary | ICD-10-CM | POA: Diagnosis not present

## 2012-04-19 DIAGNOSIS — R262 Difficulty in walking, not elsewhere classified: Secondary | ICD-10-CM | POA: Diagnosis not present

## 2012-04-19 DIAGNOSIS — I4891 Unspecified atrial fibrillation: Secondary | ICD-10-CM | POA: Diagnosis not present

## 2012-04-23 DIAGNOSIS — M545 Low back pain, unspecified: Secondary | ICD-10-CM | POA: Diagnosis not present

## 2012-04-23 DIAGNOSIS — I10 Essential (primary) hypertension: Secondary | ICD-10-CM | POA: Diagnosis not present

## 2012-04-23 DIAGNOSIS — I4891 Unspecified atrial fibrillation: Secondary | ICD-10-CM | POA: Diagnosis not present

## 2012-04-23 DIAGNOSIS — R262 Difficulty in walking, not elsewhere classified: Secondary | ICD-10-CM | POA: Diagnosis not present

## 2012-04-23 DIAGNOSIS — IMO0001 Reserved for inherently not codable concepts without codable children: Secondary | ICD-10-CM | POA: Diagnosis not present

## 2012-04-26 DIAGNOSIS — I4891 Unspecified atrial fibrillation: Secondary | ICD-10-CM | POA: Diagnosis not present

## 2012-04-26 DIAGNOSIS — I10 Essential (primary) hypertension: Secondary | ICD-10-CM | POA: Diagnosis not present

## 2012-04-26 DIAGNOSIS — IMO0001 Reserved for inherently not codable concepts without codable children: Secondary | ICD-10-CM | POA: Diagnosis not present

## 2012-04-26 DIAGNOSIS — R262 Difficulty in walking, not elsewhere classified: Secondary | ICD-10-CM | POA: Diagnosis not present

## 2012-04-26 DIAGNOSIS — M545 Low back pain, unspecified: Secondary | ICD-10-CM | POA: Diagnosis not present

## 2012-05-03 DIAGNOSIS — IMO0001 Reserved for inherently not codable concepts without codable children: Secondary | ICD-10-CM | POA: Diagnosis not present

## 2012-05-03 DIAGNOSIS — R262 Difficulty in walking, not elsewhere classified: Secondary | ICD-10-CM | POA: Diagnosis not present

## 2012-05-03 DIAGNOSIS — I4891 Unspecified atrial fibrillation: Secondary | ICD-10-CM | POA: Diagnosis not present

## 2012-05-03 DIAGNOSIS — M545 Low back pain, unspecified: Secondary | ICD-10-CM | POA: Diagnosis not present

## 2012-05-03 DIAGNOSIS — I10 Essential (primary) hypertension: Secondary | ICD-10-CM | POA: Diagnosis not present

## 2012-05-07 DIAGNOSIS — M5030 Other cervical disc degeneration, unspecified cervical region: Secondary | ICD-10-CM | POA: Diagnosis not present

## 2012-05-07 DIAGNOSIS — M542 Cervicalgia: Secondary | ICD-10-CM | POA: Diagnosis not present

## 2012-05-07 DIAGNOSIS — M545 Low back pain, unspecified: Secondary | ICD-10-CM | POA: Diagnosis not present

## 2012-05-07 DIAGNOSIS — M5412 Radiculopathy, cervical region: Secondary | ICD-10-CM | POA: Diagnosis not present

## 2012-05-07 DIAGNOSIS — M47812 Spondylosis without myelopathy or radiculopathy, cervical region: Secondary | ICD-10-CM | POA: Diagnosis not present

## 2012-05-07 DIAGNOSIS — R51 Headache: Secondary | ICD-10-CM | POA: Diagnosis not present

## 2012-05-07 DIAGNOSIS — M5000 Cervical disc disorder with myelopathy, unspecified cervical region: Secondary | ICD-10-CM | POA: Diagnosis not present

## 2012-05-07 DIAGNOSIS — I4891 Unspecified atrial fibrillation: Secondary | ICD-10-CM | POA: Diagnosis not present

## 2012-05-07 DIAGNOSIS — M4802 Spinal stenosis, cervical region: Secondary | ICD-10-CM | POA: Diagnosis not present

## 2012-05-07 DIAGNOSIS — I10 Essential (primary) hypertension: Secondary | ICD-10-CM | POA: Diagnosis not present

## 2012-05-11 DIAGNOSIS — M542 Cervicalgia: Secondary | ICD-10-CM | POA: Diagnosis not present

## 2012-05-16 DIAGNOSIS — M4802 Spinal stenosis, cervical region: Secondary | ICD-10-CM | POA: Diagnosis not present

## 2012-05-16 DIAGNOSIS — M5030 Other cervical disc degeneration, unspecified cervical region: Secondary | ICD-10-CM | POA: Diagnosis not present

## 2012-05-16 DIAGNOSIS — M5020 Other cervical disc displacement, unspecified cervical region: Secondary | ICD-10-CM | POA: Diagnosis not present

## 2012-05-16 DIAGNOSIS — M47812 Spondylosis without myelopathy or radiculopathy, cervical region: Secondary | ICD-10-CM | POA: Diagnosis not present

## 2012-05-16 DIAGNOSIS — M5000 Cervical disc disorder with myelopathy, unspecified cervical region: Secondary | ICD-10-CM | POA: Diagnosis not present

## 2012-05-20 DIAGNOSIS — M545 Low back pain, unspecified: Secondary | ICD-10-CM | POA: Diagnosis not present

## 2012-05-20 DIAGNOSIS — I4891 Unspecified atrial fibrillation: Secondary | ICD-10-CM | POA: Diagnosis not present

## 2012-05-27 DIAGNOSIS — I4891 Unspecified atrial fibrillation: Secondary | ICD-10-CM | POA: Diagnosis not present

## 2012-05-27 DIAGNOSIS — I2510 Atherosclerotic heart disease of native coronary artery without angina pectoris: Secondary | ICD-10-CM | POA: Diagnosis not present

## 2012-06-03 DIAGNOSIS — Z7901 Long term (current) use of anticoagulants: Secondary | ICD-10-CM | POA: Diagnosis not present

## 2012-06-03 DIAGNOSIS — I4891 Unspecified atrial fibrillation: Secondary | ICD-10-CM | POA: Diagnosis not present

## 2012-07-01 DIAGNOSIS — Z7901 Long term (current) use of anticoagulants: Secondary | ICD-10-CM | POA: Diagnosis not present

## 2012-07-01 DIAGNOSIS — I4891 Unspecified atrial fibrillation: Secondary | ICD-10-CM | POA: Diagnosis not present

## 2012-07-03 DIAGNOSIS — F329 Major depressive disorder, single episode, unspecified: Secondary | ICD-10-CM | POA: Diagnosis present

## 2012-07-03 DIAGNOSIS — E876 Hypokalemia: Secondary | ICD-10-CM | POA: Diagnosis present

## 2012-07-03 DIAGNOSIS — I10 Essential (primary) hypertension: Secondary | ICD-10-CM | POA: Diagnosis present

## 2012-07-03 DIAGNOSIS — D62 Acute posthemorrhagic anemia: Secondary | ICD-10-CM | POA: Diagnosis not present

## 2012-07-03 DIAGNOSIS — Z7901 Long term (current) use of anticoagulants: Secondary | ICD-10-CM | POA: Diagnosis not present

## 2012-07-03 DIAGNOSIS — R079 Chest pain, unspecified: Secondary | ICD-10-CM | POA: Diagnosis not present

## 2012-07-03 DIAGNOSIS — E785 Hyperlipidemia, unspecified: Secondary | ICD-10-CM | POA: Diagnosis present

## 2012-07-03 DIAGNOSIS — D500 Iron deficiency anemia secondary to blood loss (chronic): Secondary | ICD-10-CM | POA: Diagnosis not present

## 2012-07-03 DIAGNOSIS — Z86711 Personal history of pulmonary embolism: Secondary | ICD-10-CM | POA: Diagnosis not present

## 2012-07-03 DIAGNOSIS — D649 Anemia, unspecified: Secondary | ICD-10-CM | POA: Diagnosis not present

## 2012-07-03 DIAGNOSIS — I2510 Atherosclerotic heart disease of native coronary artery without angina pectoris: Secondary | ICD-10-CM | POA: Diagnosis present

## 2012-07-03 DIAGNOSIS — R7401 Elevation of levels of liver transaminase levels: Secondary | ICD-10-CM | POA: Diagnosis present

## 2012-07-03 DIAGNOSIS — I4891 Unspecified atrial fibrillation: Secondary | ICD-10-CM | POA: Diagnosis not present

## 2012-07-03 DIAGNOSIS — I498 Other specified cardiac arrhythmias: Secondary | ICD-10-CM | POA: Diagnosis present

## 2012-07-03 DIAGNOSIS — K922 Gastrointestinal hemorrhage, unspecified: Secondary | ICD-10-CM | POA: Diagnosis not present

## 2012-07-03 DIAGNOSIS — K449 Diaphragmatic hernia without obstruction or gangrene: Secondary | ICD-10-CM | POA: Diagnosis not present

## 2012-07-03 DIAGNOSIS — K319 Disease of stomach and duodenum, unspecified: Secondary | ICD-10-CM | POA: Diagnosis not present

## 2012-07-03 DIAGNOSIS — R011 Cardiac murmur, unspecified: Secondary | ICD-10-CM | POA: Diagnosis present

## 2012-07-03 DIAGNOSIS — Z8673 Personal history of transient ischemic attack (TIA), and cerebral infarction without residual deficits: Secondary | ICD-10-CM | POA: Diagnosis not present

## 2012-07-03 DIAGNOSIS — Z86718 Personal history of other venous thrombosis and embolism: Secondary | ICD-10-CM | POA: Diagnosis not present

## 2012-07-03 DIAGNOSIS — F3289 Other specified depressive episodes: Secondary | ICD-10-CM | POA: Diagnosis present

## 2012-07-03 DIAGNOSIS — I369 Nonrheumatic tricuspid valve disorder, unspecified: Secondary | ICD-10-CM | POA: Diagnosis not present

## 2012-07-03 HISTORY — PX: CERVICAL DISC SURGERY: SHX588

## 2012-07-09 DIAGNOSIS — D649 Anemia, unspecified: Secondary | ICD-10-CM | POA: Diagnosis not present

## 2012-07-15 DIAGNOSIS — R079 Chest pain, unspecified: Secondary | ICD-10-CM | POA: Diagnosis not present

## 2012-07-15 DIAGNOSIS — R29898 Other symptoms and signs involving the musculoskeletal system: Secondary | ICD-10-CM | POA: Diagnosis not present

## 2012-07-15 DIAGNOSIS — I10 Essential (primary) hypertension: Secondary | ICD-10-CM | POA: Diagnosis not present

## 2012-07-15 DIAGNOSIS — E785 Hyperlipidemia, unspecified: Secondary | ICD-10-CM | POA: Diagnosis not present

## 2012-07-15 DIAGNOSIS — K449 Diaphragmatic hernia without obstruction or gangrene: Secondary | ICD-10-CM | POA: Diagnosis not present

## 2012-07-15 DIAGNOSIS — I4891 Unspecified atrial fibrillation: Secondary | ICD-10-CM | POA: Diagnosis not present

## 2012-07-15 DIAGNOSIS — I509 Heart failure, unspecified: Secondary | ICD-10-CM | POA: Diagnosis not present

## 2012-07-15 DIAGNOSIS — R0789 Other chest pain: Secondary | ICD-10-CM | POA: Diagnosis not present

## 2012-07-15 DIAGNOSIS — R209 Unspecified disturbances of skin sensation: Secondary | ICD-10-CM | POA: Diagnosis not present

## 2012-07-16 DIAGNOSIS — I4891 Unspecified atrial fibrillation: Secondary | ICD-10-CM | POA: Diagnosis not present

## 2012-07-17 DIAGNOSIS — R030 Elevated blood-pressure reading, without diagnosis of hypertension: Secondary | ICD-10-CM | POA: Diagnosis not present

## 2012-07-17 DIAGNOSIS — R072 Precordial pain: Secondary | ICD-10-CM | POA: Diagnosis not present

## 2012-07-17 DIAGNOSIS — D649 Anemia, unspecified: Secondary | ICD-10-CM | POA: Diagnosis not present

## 2012-07-22 DIAGNOSIS — D508 Other iron deficiency anemias: Secondary | ICD-10-CM | POA: Diagnosis not present

## 2012-07-26 DIAGNOSIS — D649 Anemia, unspecified: Secondary | ICD-10-CM | POA: Diagnosis not present

## 2012-07-26 DIAGNOSIS — D500 Iron deficiency anemia secondary to blood loss (chronic): Secondary | ICD-10-CM | POA: Diagnosis not present

## 2012-07-26 DIAGNOSIS — I10 Essential (primary) hypertension: Secondary | ICD-10-CM | POA: Diagnosis not present

## 2012-08-08 DIAGNOSIS — I10 Essential (primary) hypertension: Secondary | ICD-10-CM | POA: Diagnosis not present

## 2012-08-08 DIAGNOSIS — H9209 Otalgia, unspecified ear: Secondary | ICD-10-CM | POA: Diagnosis not present

## 2012-08-08 DIAGNOSIS — I4891 Unspecified atrial fibrillation: Secondary | ICD-10-CM | POA: Diagnosis not present

## 2012-08-08 DIAGNOSIS — M545 Low back pain, unspecified: Secondary | ICD-10-CM | POA: Diagnosis not present

## 2012-08-08 DIAGNOSIS — D649 Anemia, unspecified: Secondary | ICD-10-CM | POA: Diagnosis not present

## 2012-08-08 DIAGNOSIS — G44009 Cluster headache syndrome, unspecified, not intractable: Secondary | ICD-10-CM | POA: Diagnosis not present

## 2012-08-21 DIAGNOSIS — I4891 Unspecified atrial fibrillation: Secondary | ICD-10-CM | POA: Diagnosis not present

## 2012-08-21 DIAGNOSIS — I2510 Atherosclerotic heart disease of native coronary artery without angina pectoris: Secondary | ICD-10-CM | POA: Diagnosis not present

## 2012-09-03 DIAGNOSIS — M5000 Cervical disc disorder with myelopathy, unspecified cervical region: Secondary | ICD-10-CM | POA: Diagnosis not present

## 2012-09-03 DIAGNOSIS — M5030 Other cervical disc degeneration, unspecified cervical region: Secondary | ICD-10-CM | POA: Diagnosis not present

## 2012-09-03 DIAGNOSIS — M47812 Spondylosis without myelopathy or radiculopathy, cervical region: Secondary | ICD-10-CM | POA: Diagnosis not present

## 2012-09-03 DIAGNOSIS — M4802 Spinal stenosis, cervical region: Secondary | ICD-10-CM | POA: Diagnosis not present

## 2012-09-03 DIAGNOSIS — M5020 Other cervical disc displacement, unspecified cervical region: Secondary | ICD-10-CM | POA: Diagnosis not present

## 2012-09-12 DIAGNOSIS — H9209 Otalgia, unspecified ear: Secondary | ICD-10-CM | POA: Diagnosis not present

## 2012-09-13 DIAGNOSIS — Z01810 Encounter for preprocedural cardiovascular examination: Secondary | ICD-10-CM | POA: Diagnosis not present

## 2012-09-13 DIAGNOSIS — M5020 Other cervical disc displacement, unspecified cervical region: Secondary | ICD-10-CM | POA: Diagnosis not present

## 2012-09-13 DIAGNOSIS — M5030 Other cervical disc degeneration, unspecified cervical region: Secondary | ICD-10-CM | POA: Diagnosis not present

## 2012-09-13 DIAGNOSIS — M47812 Spondylosis without myelopathy or radiculopathy, cervical region: Secondary | ICD-10-CM | POA: Diagnosis not present

## 2012-09-13 DIAGNOSIS — M5000 Cervical disc disorder with myelopathy, unspecified cervical region: Secondary | ICD-10-CM | POA: Diagnosis not present

## 2012-09-13 DIAGNOSIS — M4802 Spinal stenosis, cervical region: Secondary | ICD-10-CM | POA: Diagnosis not present

## 2012-09-13 DIAGNOSIS — Z01818 Encounter for other preprocedural examination: Secondary | ICD-10-CM | POA: Diagnosis not present

## 2012-09-17 DIAGNOSIS — M5000 Cervical disc disorder with myelopathy, unspecified cervical region: Secondary | ICD-10-CM | POA: Diagnosis not present

## 2012-09-17 DIAGNOSIS — M4802 Spinal stenosis, cervical region: Secondary | ICD-10-CM | POA: Diagnosis not present

## 2012-09-17 DIAGNOSIS — R51 Headache: Secondary | ICD-10-CM | POA: Diagnosis not present

## 2012-09-17 DIAGNOSIS — M47812 Spondylosis without myelopathy or radiculopathy, cervical region: Secondary | ICD-10-CM | POA: Diagnosis not present

## 2012-09-17 DIAGNOSIS — M5030 Other cervical disc degeneration, unspecified cervical region: Secondary | ICD-10-CM | POA: Diagnosis not present

## 2012-09-17 DIAGNOSIS — M5020 Other cervical disc displacement, unspecified cervical region: Secondary | ICD-10-CM | POA: Diagnosis not present

## 2012-09-27 DIAGNOSIS — M25579 Pain in unspecified ankle and joints of unspecified foot: Secondary | ICD-10-CM | POA: Diagnosis not present

## 2012-09-27 DIAGNOSIS — R635 Abnormal weight gain: Secondary | ICD-10-CM | POA: Diagnosis not present

## 2012-09-27 DIAGNOSIS — F3289 Other specified depressive episodes: Secondary | ICD-10-CM | POA: Diagnosis not present

## 2012-09-27 DIAGNOSIS — R072 Precordial pain: Secondary | ICD-10-CM | POA: Diagnosis not present

## 2012-09-27 DIAGNOSIS — F329 Major depressive disorder, single episode, unspecified: Secondary | ICD-10-CM | POA: Diagnosis not present

## 2012-10-09 DIAGNOSIS — I8000 Phlebitis and thrombophlebitis of superficial vessels of unspecified lower extremity: Secondary | ICD-10-CM | POA: Diagnosis not present

## 2012-10-14 DIAGNOSIS — M5000 Cervical disc disorder with myelopathy, unspecified cervical region: Secondary | ICD-10-CM | POA: Diagnosis not present

## 2012-10-14 DIAGNOSIS — F329 Major depressive disorder, single episode, unspecified: Secondary | ICD-10-CM | POA: Diagnosis present

## 2012-10-14 DIAGNOSIS — Z981 Arthrodesis status: Secondary | ICD-10-CM | POA: Diagnosis not present

## 2012-10-14 DIAGNOSIS — Z8673 Personal history of transient ischemic attack (TIA), and cerebral infarction without residual deficits: Secondary | ICD-10-CM | POA: Diagnosis not present

## 2012-10-14 DIAGNOSIS — F3289 Other specified depressive episodes: Secondary | ICD-10-CM | POA: Diagnosis not present

## 2012-10-14 DIAGNOSIS — G950 Syringomyelia and syringobulbia: Secondary | ICD-10-CM | POA: Diagnosis not present

## 2012-10-14 DIAGNOSIS — M5020 Other cervical disc displacement, unspecified cervical region: Secondary | ICD-10-CM | POA: Diagnosis not present

## 2012-10-14 DIAGNOSIS — R209 Unspecified disturbances of skin sensation: Secondary | ICD-10-CM | POA: Diagnosis not present

## 2012-10-14 DIAGNOSIS — G542 Cervical root disorders, not elsewhere classified: Secondary | ICD-10-CM | POA: Diagnosis not present

## 2012-10-14 DIAGNOSIS — J029 Acute pharyngitis, unspecified: Secondary | ICD-10-CM | POA: Diagnosis not present

## 2012-10-14 DIAGNOSIS — M4802 Spinal stenosis, cervical region: Secondary | ICD-10-CM | POA: Diagnosis not present

## 2012-10-14 DIAGNOSIS — I10 Essential (primary) hypertension: Secondary | ICD-10-CM | POA: Diagnosis not present

## 2012-10-14 DIAGNOSIS — M79609 Pain in unspecified limb: Secondary | ICD-10-CM | POA: Diagnosis not present

## 2012-10-14 DIAGNOSIS — I509 Heart failure, unspecified: Secondary | ICD-10-CM | POA: Diagnosis not present

## 2012-10-14 DIAGNOSIS — M5412 Radiculopathy, cervical region: Secondary | ICD-10-CM | POA: Diagnosis not present

## 2012-10-17 DIAGNOSIS — I4891 Unspecified atrial fibrillation: Secondary | ICD-10-CM | POA: Diagnosis not present

## 2012-10-17 DIAGNOSIS — Z4789 Encounter for other orthopedic aftercare: Secondary | ICD-10-CM | POA: Diagnosis not present

## 2012-10-17 DIAGNOSIS — I509 Heart failure, unspecified: Secondary | ICD-10-CM | POA: Diagnosis not present

## 2012-10-17 DIAGNOSIS — Z86718 Personal history of other venous thrombosis and embolism: Secondary | ICD-10-CM | POA: Diagnosis not present

## 2012-10-17 DIAGNOSIS — R1313 Dysphagia, pharyngeal phase: Secondary | ICD-10-CM | POA: Diagnosis not present

## 2012-10-21 DIAGNOSIS — I509 Heart failure, unspecified: Secondary | ICD-10-CM | POA: Diagnosis not present

## 2012-10-21 DIAGNOSIS — Z86718 Personal history of other venous thrombosis and embolism: Secondary | ICD-10-CM | POA: Diagnosis not present

## 2012-10-21 DIAGNOSIS — R1313 Dysphagia, pharyngeal phase: Secondary | ICD-10-CM | POA: Diagnosis not present

## 2012-10-21 DIAGNOSIS — Z4789 Encounter for other orthopedic aftercare: Secondary | ICD-10-CM | POA: Diagnosis not present

## 2012-10-21 DIAGNOSIS — I4891 Unspecified atrial fibrillation: Secondary | ICD-10-CM | POA: Diagnosis not present

## 2012-10-23 DIAGNOSIS — R1313 Dysphagia, pharyngeal phase: Secondary | ICD-10-CM | POA: Diagnosis not present

## 2012-10-23 DIAGNOSIS — Z86718 Personal history of other venous thrombosis and embolism: Secondary | ICD-10-CM | POA: Diagnosis not present

## 2012-10-23 DIAGNOSIS — I509 Heart failure, unspecified: Secondary | ICD-10-CM | POA: Diagnosis not present

## 2012-10-23 DIAGNOSIS — I4891 Unspecified atrial fibrillation: Secondary | ICD-10-CM | POA: Diagnosis not present

## 2012-10-23 DIAGNOSIS — Z4789 Encounter for other orthopedic aftercare: Secondary | ICD-10-CM | POA: Diagnosis not present

## 2012-10-24 DIAGNOSIS — Z86718 Personal history of other venous thrombosis and embolism: Secondary | ICD-10-CM | POA: Diagnosis not present

## 2012-10-24 DIAGNOSIS — Z4789 Encounter for other orthopedic aftercare: Secondary | ICD-10-CM | POA: Diagnosis not present

## 2012-10-24 DIAGNOSIS — R1313 Dysphagia, pharyngeal phase: Secondary | ICD-10-CM | POA: Diagnosis not present

## 2012-10-24 DIAGNOSIS — I4891 Unspecified atrial fibrillation: Secondary | ICD-10-CM | POA: Diagnosis not present

## 2012-10-24 DIAGNOSIS — I509 Heart failure, unspecified: Secondary | ICD-10-CM | POA: Diagnosis not present

## 2012-10-25 DIAGNOSIS — I509 Heart failure, unspecified: Secondary | ICD-10-CM | POA: Diagnosis not present

## 2012-10-25 DIAGNOSIS — Z4789 Encounter for other orthopedic aftercare: Secondary | ICD-10-CM | POA: Diagnosis not present

## 2012-10-25 DIAGNOSIS — Z86718 Personal history of other venous thrombosis and embolism: Secondary | ICD-10-CM | POA: Diagnosis not present

## 2012-10-25 DIAGNOSIS — I4891 Unspecified atrial fibrillation: Secondary | ICD-10-CM | POA: Diagnosis not present

## 2012-10-25 DIAGNOSIS — R1313 Dysphagia, pharyngeal phase: Secondary | ICD-10-CM | POA: Diagnosis not present

## 2012-10-28 DIAGNOSIS — Z86718 Personal history of other venous thrombosis and embolism: Secondary | ICD-10-CM | POA: Diagnosis not present

## 2012-10-28 DIAGNOSIS — R1313 Dysphagia, pharyngeal phase: Secondary | ICD-10-CM | POA: Diagnosis not present

## 2012-10-28 DIAGNOSIS — I509 Heart failure, unspecified: Secondary | ICD-10-CM | POA: Diagnosis not present

## 2012-10-28 DIAGNOSIS — Z4789 Encounter for other orthopedic aftercare: Secondary | ICD-10-CM | POA: Diagnosis not present

## 2012-10-28 DIAGNOSIS — I4891 Unspecified atrial fibrillation: Secondary | ICD-10-CM | POA: Diagnosis not present

## 2012-10-29 DIAGNOSIS — M4802 Spinal stenosis, cervical region: Secondary | ICD-10-CM | POA: Diagnosis not present

## 2012-10-29 DIAGNOSIS — M5000 Cervical disc disorder with myelopathy, unspecified cervical region: Secondary | ICD-10-CM | POA: Diagnosis not present

## 2012-10-29 DIAGNOSIS — M5412 Radiculopathy, cervical region: Secondary | ICD-10-CM | POA: Diagnosis not present

## 2012-10-30 DIAGNOSIS — I509 Heart failure, unspecified: Secondary | ICD-10-CM | POA: Diagnosis not present

## 2012-10-30 DIAGNOSIS — Z4789 Encounter for other orthopedic aftercare: Secondary | ICD-10-CM | POA: Diagnosis not present

## 2012-10-30 DIAGNOSIS — Z86718 Personal history of other venous thrombosis and embolism: Secondary | ICD-10-CM | POA: Diagnosis not present

## 2012-10-30 DIAGNOSIS — I4891 Unspecified atrial fibrillation: Secondary | ICD-10-CM | POA: Diagnosis not present

## 2012-10-30 DIAGNOSIS — R1313 Dysphagia, pharyngeal phase: Secondary | ICD-10-CM | POA: Diagnosis not present

## 2012-11-01 DIAGNOSIS — Z4789 Encounter for other orthopedic aftercare: Secondary | ICD-10-CM | POA: Diagnosis not present

## 2012-11-01 DIAGNOSIS — Z86718 Personal history of other venous thrombosis and embolism: Secondary | ICD-10-CM | POA: Diagnosis not present

## 2012-11-01 DIAGNOSIS — R1313 Dysphagia, pharyngeal phase: Secondary | ICD-10-CM | POA: Diagnosis not present

## 2012-11-01 DIAGNOSIS — I509 Heart failure, unspecified: Secondary | ICD-10-CM | POA: Diagnosis not present

## 2012-11-01 DIAGNOSIS — I4891 Unspecified atrial fibrillation: Secondary | ICD-10-CM | POA: Diagnosis not present

## 2012-11-04 DIAGNOSIS — Z86718 Personal history of other venous thrombosis and embolism: Secondary | ICD-10-CM | POA: Diagnosis not present

## 2012-11-04 DIAGNOSIS — I4891 Unspecified atrial fibrillation: Secondary | ICD-10-CM | POA: Diagnosis not present

## 2012-11-04 DIAGNOSIS — Z4789 Encounter for other orthopedic aftercare: Secondary | ICD-10-CM | POA: Diagnosis not present

## 2012-11-04 DIAGNOSIS — I509 Heart failure, unspecified: Secondary | ICD-10-CM | POA: Diagnosis not present

## 2012-11-04 DIAGNOSIS — R1313 Dysphagia, pharyngeal phase: Secondary | ICD-10-CM | POA: Diagnosis not present

## 2012-11-05 DIAGNOSIS — Z4789 Encounter for other orthopedic aftercare: Secondary | ICD-10-CM | POA: Diagnosis not present

## 2012-11-05 DIAGNOSIS — R1313 Dysphagia, pharyngeal phase: Secondary | ICD-10-CM | POA: Diagnosis not present

## 2012-11-05 DIAGNOSIS — I4891 Unspecified atrial fibrillation: Secondary | ICD-10-CM | POA: Diagnosis not present

## 2012-11-05 DIAGNOSIS — Z86718 Personal history of other venous thrombosis and embolism: Secondary | ICD-10-CM | POA: Diagnosis not present

## 2012-11-05 DIAGNOSIS — I509 Heart failure, unspecified: Secondary | ICD-10-CM | POA: Diagnosis not present

## 2012-11-06 DIAGNOSIS — R1313 Dysphagia, pharyngeal phase: Secondary | ICD-10-CM | POA: Diagnosis not present

## 2012-11-06 DIAGNOSIS — I4891 Unspecified atrial fibrillation: Secondary | ICD-10-CM | POA: Diagnosis not present

## 2012-11-06 DIAGNOSIS — Z86718 Personal history of other venous thrombosis and embolism: Secondary | ICD-10-CM | POA: Diagnosis not present

## 2012-11-06 DIAGNOSIS — Z4789 Encounter for other orthopedic aftercare: Secondary | ICD-10-CM | POA: Diagnosis not present

## 2012-11-06 DIAGNOSIS — I509 Heart failure, unspecified: Secondary | ICD-10-CM | POA: Diagnosis not present

## 2012-11-07 DIAGNOSIS — I4891 Unspecified atrial fibrillation: Secondary | ICD-10-CM | POA: Diagnosis not present

## 2012-11-07 DIAGNOSIS — R1313 Dysphagia, pharyngeal phase: Secondary | ICD-10-CM | POA: Diagnosis not present

## 2012-11-07 DIAGNOSIS — I509 Heart failure, unspecified: Secondary | ICD-10-CM | POA: Diagnosis not present

## 2012-11-07 DIAGNOSIS — Z4789 Encounter for other orthopedic aftercare: Secondary | ICD-10-CM | POA: Diagnosis not present

## 2012-11-07 DIAGNOSIS — Z86718 Personal history of other venous thrombosis and embolism: Secondary | ICD-10-CM | POA: Diagnosis not present

## 2012-11-08 DIAGNOSIS — R1313 Dysphagia, pharyngeal phase: Secondary | ICD-10-CM | POA: Diagnosis not present

## 2012-11-08 DIAGNOSIS — I4891 Unspecified atrial fibrillation: Secondary | ICD-10-CM | POA: Diagnosis not present

## 2012-11-08 DIAGNOSIS — Z4789 Encounter for other orthopedic aftercare: Secondary | ICD-10-CM | POA: Diagnosis not present

## 2012-11-08 DIAGNOSIS — I509 Heart failure, unspecified: Secondary | ICD-10-CM | POA: Diagnosis not present

## 2012-11-08 DIAGNOSIS — Z86718 Personal history of other venous thrombosis and embolism: Secondary | ICD-10-CM | POA: Diagnosis not present

## 2012-11-11 DIAGNOSIS — R1313 Dysphagia, pharyngeal phase: Secondary | ICD-10-CM | POA: Diagnosis not present

## 2012-11-11 DIAGNOSIS — Z4789 Encounter for other orthopedic aftercare: Secondary | ICD-10-CM | POA: Diagnosis not present

## 2012-11-11 DIAGNOSIS — I509 Heart failure, unspecified: Secondary | ICD-10-CM | POA: Diagnosis not present

## 2012-11-11 DIAGNOSIS — Z86718 Personal history of other venous thrombosis and embolism: Secondary | ICD-10-CM | POA: Diagnosis not present

## 2012-11-11 DIAGNOSIS — I4891 Unspecified atrial fibrillation: Secondary | ICD-10-CM | POA: Diagnosis not present

## 2012-11-13 DIAGNOSIS — I509 Heart failure, unspecified: Secondary | ICD-10-CM | POA: Diagnosis not present

## 2012-11-13 DIAGNOSIS — I4891 Unspecified atrial fibrillation: Secondary | ICD-10-CM | POA: Diagnosis not present

## 2012-11-13 DIAGNOSIS — Z4789 Encounter for other orthopedic aftercare: Secondary | ICD-10-CM | POA: Diagnosis not present

## 2012-11-13 DIAGNOSIS — Z86718 Personal history of other venous thrombosis and embolism: Secondary | ICD-10-CM | POA: Diagnosis not present

## 2012-11-13 DIAGNOSIS — R1313 Dysphagia, pharyngeal phase: Secondary | ICD-10-CM | POA: Diagnosis not present

## 2012-11-14 DIAGNOSIS — R1313 Dysphagia, pharyngeal phase: Secondary | ICD-10-CM | POA: Diagnosis not present

## 2012-11-14 DIAGNOSIS — I509 Heart failure, unspecified: Secondary | ICD-10-CM | POA: Diagnosis not present

## 2012-11-14 DIAGNOSIS — I4891 Unspecified atrial fibrillation: Secondary | ICD-10-CM | POA: Diagnosis not present

## 2012-11-14 DIAGNOSIS — Z4789 Encounter for other orthopedic aftercare: Secondary | ICD-10-CM | POA: Diagnosis not present

## 2012-11-14 DIAGNOSIS — Z86718 Personal history of other venous thrombosis and embolism: Secondary | ICD-10-CM | POA: Diagnosis not present

## 2012-11-15 DIAGNOSIS — I509 Heart failure, unspecified: Secondary | ICD-10-CM | POA: Diagnosis not present

## 2012-11-15 DIAGNOSIS — I4891 Unspecified atrial fibrillation: Secondary | ICD-10-CM | POA: Diagnosis not present

## 2012-11-15 DIAGNOSIS — Z4789 Encounter for other orthopedic aftercare: Secondary | ICD-10-CM | POA: Diagnosis not present

## 2012-11-15 DIAGNOSIS — R1313 Dysphagia, pharyngeal phase: Secondary | ICD-10-CM | POA: Diagnosis not present

## 2012-11-15 DIAGNOSIS — Z86718 Personal history of other venous thrombosis and embolism: Secondary | ICD-10-CM | POA: Diagnosis not present

## 2012-11-26 DIAGNOSIS — I10 Essential (primary) hypertension: Secondary | ICD-10-CM | POA: Diagnosis not present

## 2012-11-26 DIAGNOSIS — M5000 Cervical disc disorder with myelopathy, unspecified cervical region: Secondary | ICD-10-CM | POA: Diagnosis not present

## 2012-11-26 DIAGNOSIS — M79609 Pain in unspecified limb: Secondary | ICD-10-CM | POA: Diagnosis not present

## 2012-11-26 DIAGNOSIS — I4891 Unspecified atrial fibrillation: Secondary | ICD-10-CM | POA: Diagnosis not present

## 2012-11-26 DIAGNOSIS — M4802 Spinal stenosis, cervical region: Secondary | ICD-10-CM | POA: Diagnosis not present

## 2012-11-26 DIAGNOSIS — M5412 Radiculopathy, cervical region: Secondary | ICD-10-CM | POA: Diagnosis not present

## 2012-11-26 DIAGNOSIS — M7989 Other specified soft tissue disorders: Secondary | ICD-10-CM | POA: Diagnosis not present

## 2012-11-28 DIAGNOSIS — M25473 Effusion, unspecified ankle: Secondary | ICD-10-CM | POA: Diagnosis not present

## 2012-11-28 DIAGNOSIS — M25476 Effusion, unspecified foot: Secondary | ICD-10-CM | POA: Diagnosis not present

## 2012-11-28 DIAGNOSIS — M79609 Pain in unspecified limb: Secondary | ICD-10-CM | POA: Diagnosis not present

## 2013-01-01 DIAGNOSIS — K449 Diaphragmatic hernia without obstruction or gangrene: Secondary | ICD-10-CM | POA: Diagnosis not present

## 2013-01-01 DIAGNOSIS — I4891 Unspecified atrial fibrillation: Secondary | ICD-10-CM | POA: Diagnosis not present

## 2013-01-01 DIAGNOSIS — I10 Essential (primary) hypertension: Secondary | ICD-10-CM | POA: Diagnosis not present

## 2013-01-01 DIAGNOSIS — R42 Dizziness and giddiness: Secondary | ICD-10-CM | POA: Diagnosis not present

## 2013-01-01 DIAGNOSIS — Q794 Prune belly syndrome: Secondary | ICD-10-CM | POA: Diagnosis not present

## 2013-01-01 DIAGNOSIS — R109 Unspecified abdominal pain: Secondary | ICD-10-CM | POA: Diagnosis not present

## 2013-01-06 DIAGNOSIS — M79609 Pain in unspecified limb: Secondary | ICD-10-CM | POA: Diagnosis not present

## 2013-01-06 DIAGNOSIS — R109 Unspecified abdominal pain: Secondary | ICD-10-CM | POA: Diagnosis not present

## 2013-01-06 DIAGNOSIS — R609 Edema, unspecified: Secondary | ICD-10-CM | POA: Diagnosis not present

## 2013-01-06 DIAGNOSIS — D649 Anemia, unspecified: Secondary | ICD-10-CM | POA: Diagnosis not present

## 2013-01-14 DIAGNOSIS — R609 Edema, unspecified: Secondary | ICD-10-CM | POA: Diagnosis not present

## 2013-01-14 DIAGNOSIS — R109 Unspecified abdominal pain: Secondary | ICD-10-CM | POA: Diagnosis not present

## 2013-01-14 DIAGNOSIS — D649 Anemia, unspecified: Secondary | ICD-10-CM | POA: Diagnosis not present

## 2013-01-15 DIAGNOSIS — N8111 Cystocele, midline: Secondary | ICD-10-CM | POA: Diagnosis not present

## 2013-01-15 DIAGNOSIS — R32 Unspecified urinary incontinence: Secondary | ICD-10-CM | POA: Diagnosis not present

## 2013-01-21 DIAGNOSIS — K449 Diaphragmatic hernia without obstruction or gangrene: Secondary | ICD-10-CM | POA: Diagnosis not present

## 2013-01-21 DIAGNOSIS — K219 Gastro-esophageal reflux disease without esophagitis: Secondary | ICD-10-CM | POA: Diagnosis not present

## 2013-01-23 DIAGNOSIS — I10 Essential (primary) hypertension: Secondary | ICD-10-CM | POA: Diagnosis not present

## 2013-01-23 DIAGNOSIS — R0602 Shortness of breath: Secondary | ICD-10-CM | POA: Diagnosis not present

## 2013-01-23 DIAGNOSIS — Z86711 Personal history of pulmonary embolism: Secondary | ICD-10-CM | POA: Diagnosis not present

## 2013-01-23 DIAGNOSIS — Z8673 Personal history of transient ischemic attack (TIA), and cerebral infarction without residual deficits: Secondary | ICD-10-CM | POA: Diagnosis not present

## 2013-01-23 DIAGNOSIS — F411 Generalized anxiety disorder: Secondary | ICD-10-CM | POA: Diagnosis not present

## 2013-01-23 DIAGNOSIS — R079 Chest pain, unspecified: Secondary | ICD-10-CM | POA: Diagnosis not present

## 2013-01-23 DIAGNOSIS — I824Y9 Acute embolism and thrombosis of unspecified deep veins of unspecified proximal lower extremity: Secondary | ICD-10-CM | POA: Diagnosis not present

## 2013-01-23 DIAGNOSIS — R110 Nausea: Secondary | ICD-10-CM | POA: Diagnosis not present

## 2013-01-23 DIAGNOSIS — K219 Gastro-esophageal reflux disease without esophagitis: Secondary | ICD-10-CM | POA: Diagnosis not present

## 2013-01-23 DIAGNOSIS — Z91041 Radiographic dye allergy status: Secondary | ICD-10-CM | POA: Diagnosis not present

## 2013-01-23 DIAGNOSIS — I82409 Acute embolism and thrombosis of unspecified deep veins of unspecified lower extremity: Secondary | ICD-10-CM | POA: Diagnosis not present

## 2013-01-27 DIAGNOSIS — I80299 Phlebitis and thrombophlebitis of other deep vessels of unspecified lower extremity: Secondary | ICD-10-CM | POA: Diagnosis not present

## 2013-01-27 DIAGNOSIS — D649 Anemia, unspecified: Secondary | ICD-10-CM | POA: Diagnosis not present

## 2013-01-27 DIAGNOSIS — R0789 Other chest pain: Secondary | ICD-10-CM | POA: Diagnosis not present

## 2013-01-27 DIAGNOSIS — I4891 Unspecified atrial fibrillation: Secondary | ICD-10-CM | POA: Diagnosis not present

## 2013-01-28 DIAGNOSIS — D649 Anemia, unspecified: Secondary | ICD-10-CM | POA: Diagnosis not present

## 2013-01-31 DIAGNOSIS — I4891 Unspecified atrial fibrillation: Secondary | ICD-10-CM | POA: Diagnosis not present

## 2013-01-31 DIAGNOSIS — D649 Anemia, unspecified: Secondary | ICD-10-CM | POA: Diagnosis not present

## 2013-01-31 DIAGNOSIS — I80299 Phlebitis and thrombophlebitis of other deep vessels of unspecified lower extremity: Secondary | ICD-10-CM | POA: Diagnosis not present

## 2013-01-31 DIAGNOSIS — D4120 Neoplasm of uncertain behavior of unspecified ureter: Secondary | ICD-10-CM | POA: Diagnosis not present

## 2013-01-31 DIAGNOSIS — D4100 Neoplasm of uncertain behavior of unspecified kidney: Secondary | ICD-10-CM | POA: Diagnosis not present

## 2013-02-06 DIAGNOSIS — N281 Cyst of kidney, acquired: Secondary | ICD-10-CM | POA: Diagnosis not present

## 2013-02-11 DIAGNOSIS — Z7901 Long term (current) use of anticoagulants: Secondary | ICD-10-CM | POA: Diagnosis not present

## 2013-04-01 DIAGNOSIS — Z7901 Long term (current) use of anticoagulants: Secondary | ICD-10-CM | POA: Diagnosis not present

## 2013-04-01 DIAGNOSIS — I4891 Unspecified atrial fibrillation: Secondary | ICD-10-CM | POA: Diagnosis not present

## 2013-04-15 DIAGNOSIS — C7490 Malignant neoplasm of unspecified part of unspecified adrenal gland: Secondary | ICD-10-CM | POA: Diagnosis not present

## 2013-04-15 DIAGNOSIS — D649 Anemia, unspecified: Secondary | ICD-10-CM | POA: Diagnosis not present

## 2013-04-15 DIAGNOSIS — M542 Cervicalgia: Secondary | ICD-10-CM | POA: Diagnosis not present

## 2013-04-15 DIAGNOSIS — Z7901 Long term (current) use of anticoagulants: Secondary | ICD-10-CM | POA: Diagnosis not present

## 2013-04-15 DIAGNOSIS — I4891 Unspecified atrial fibrillation: Secondary | ICD-10-CM | POA: Diagnosis not present

## 2013-04-15 DIAGNOSIS — G44009 Cluster headache syndrome, unspecified, not intractable: Secondary | ICD-10-CM | POA: Diagnosis not present

## 2013-04-15 DIAGNOSIS — R42 Dizziness and giddiness: Secondary | ICD-10-CM | POA: Diagnosis not present

## 2013-04-17 DIAGNOSIS — M545 Low back pain, unspecified: Secondary | ICD-10-CM | POA: Diagnosis not present

## 2013-04-17 DIAGNOSIS — M5137 Other intervertebral disc degeneration, lumbosacral region: Secondary | ICD-10-CM | POA: Diagnosis not present

## 2013-04-30 DIAGNOSIS — M4802 Spinal stenosis, cervical region: Secondary | ICD-10-CM | POA: Diagnosis not present

## 2013-04-30 DIAGNOSIS — M5000 Cervical disc disorder with myelopathy, unspecified cervical region: Secondary | ICD-10-CM | POA: Diagnosis not present

## 2013-04-30 DIAGNOSIS — D649 Anemia, unspecified: Secondary | ICD-10-CM | POA: Diagnosis not present

## 2013-04-30 DIAGNOSIS — M5137 Other intervertebral disc degeneration, lumbosacral region: Secondary | ICD-10-CM | POA: Diagnosis not present

## 2013-04-30 DIAGNOSIS — M545 Low back pain, unspecified: Secondary | ICD-10-CM | POA: Diagnosis not present

## 2013-04-30 DIAGNOSIS — M5412 Radiculopathy, cervical region: Secondary | ICD-10-CM | POA: Diagnosis not present

## 2013-05-02 DIAGNOSIS — M5412 Radiculopathy, cervical region: Secondary | ICD-10-CM | POA: Diagnosis not present

## 2013-05-02 DIAGNOSIS — M5137 Other intervertebral disc degeneration, lumbosacral region: Secondary | ICD-10-CM | POA: Diagnosis not present

## 2013-05-02 DIAGNOSIS — M4802 Spinal stenosis, cervical region: Secondary | ICD-10-CM | POA: Diagnosis not present

## 2013-05-02 DIAGNOSIS — M5000 Cervical disc disorder with myelopathy, unspecified cervical region: Secondary | ICD-10-CM | POA: Diagnosis not present

## 2013-05-02 DIAGNOSIS — M545 Low back pain, unspecified: Secondary | ICD-10-CM | POA: Diagnosis not present

## 2013-05-05 DIAGNOSIS — M5412 Radiculopathy, cervical region: Secondary | ICD-10-CM | POA: Diagnosis not present

## 2013-05-05 DIAGNOSIS — M4802 Spinal stenosis, cervical region: Secondary | ICD-10-CM | POA: Diagnosis not present

## 2013-05-05 DIAGNOSIS — M5137 Other intervertebral disc degeneration, lumbosacral region: Secondary | ICD-10-CM | POA: Diagnosis not present

## 2013-05-05 DIAGNOSIS — M5000 Cervical disc disorder with myelopathy, unspecified cervical region: Secondary | ICD-10-CM | POA: Diagnosis not present

## 2013-05-05 DIAGNOSIS — M545 Low back pain, unspecified: Secondary | ICD-10-CM | POA: Diagnosis not present

## 2013-05-07 DIAGNOSIS — R5381 Other malaise: Secondary | ICD-10-CM | POA: Diagnosis not present

## 2013-05-07 DIAGNOSIS — R4182 Altered mental status, unspecified: Secondary | ICD-10-CM | POA: Diagnosis not present

## 2013-05-07 DIAGNOSIS — R079 Chest pain, unspecified: Secondary | ICD-10-CM | POA: Diagnosis not present

## 2013-05-07 DIAGNOSIS — I059 Rheumatic mitral valve disease, unspecified: Secondary | ICD-10-CM | POA: Diagnosis not present

## 2013-05-08 DIAGNOSIS — I059 Rheumatic mitral valve disease, unspecified: Secondary | ICD-10-CM | POA: Diagnosis not present

## 2013-05-08 DIAGNOSIS — R079 Chest pain, unspecified: Secondary | ICD-10-CM | POA: Diagnosis not present

## 2013-05-09 DIAGNOSIS — F29 Unspecified psychosis not due to a substance or known physiological condition: Secondary | ICD-10-CM | POA: Diagnosis not present

## 2013-05-09 DIAGNOSIS — I059 Rheumatic mitral valve disease, unspecified: Secondary | ICD-10-CM | POA: Diagnosis not present

## 2013-05-09 DIAGNOSIS — R079 Chest pain, unspecified: Secondary | ICD-10-CM | POA: Diagnosis not present

## 2013-05-09 DIAGNOSIS — R4789 Other speech disturbances: Secondary | ICD-10-CM | POA: Diagnosis not present

## 2013-05-09 DIAGNOSIS — I658 Occlusion and stenosis of other precerebral arteries: Secondary | ICD-10-CM | POA: Diagnosis not present

## 2013-05-09 DIAGNOSIS — I6529 Occlusion and stenosis of unspecified carotid artery: Secondary | ICD-10-CM | POA: Diagnosis not present

## 2013-05-10 DIAGNOSIS — R079 Chest pain, unspecified: Secondary | ICD-10-CM | POA: Diagnosis not present

## 2013-05-11 DIAGNOSIS — Z86711 Personal history of pulmonary embolism: Secondary | ICD-10-CM | POA: Diagnosis not present

## 2013-05-11 DIAGNOSIS — D509 Iron deficiency anemia, unspecified: Secondary | ICD-10-CM | POA: Diagnosis not present

## 2013-05-11 DIAGNOSIS — R4182 Altered mental status, unspecified: Secondary | ICD-10-CM | POA: Diagnosis not present

## 2013-05-11 DIAGNOSIS — G40909 Epilepsy, unspecified, not intractable, without status epilepticus: Secondary | ICD-10-CM | POA: Diagnosis not present

## 2013-05-11 DIAGNOSIS — I2510 Atherosclerotic heart disease of native coronary artery without angina pectoris: Secondary | ICD-10-CM | POA: Diagnosis not present

## 2013-05-11 DIAGNOSIS — R0609 Other forms of dyspnea: Secondary | ICD-10-CM | POA: Diagnosis not present

## 2013-05-11 DIAGNOSIS — Z86718 Personal history of other venous thrombosis and embolism: Secondary | ICD-10-CM | POA: Diagnosis not present

## 2013-05-11 DIAGNOSIS — R079 Chest pain, unspecified: Secondary | ICD-10-CM | POA: Diagnosis not present

## 2013-05-12 DIAGNOSIS — I2510 Atherosclerotic heart disease of native coronary artery without angina pectoris: Secondary | ICD-10-CM | POA: Diagnosis present

## 2013-05-12 DIAGNOSIS — M6281 Muscle weakness (generalized): Secondary | ICD-10-CM | POA: Diagnosis not present

## 2013-05-12 DIAGNOSIS — Z8673 Personal history of transient ischemic attack (TIA), and cerebral infarction without residual deficits: Secondary | ICD-10-CM | POA: Diagnosis not present

## 2013-05-12 DIAGNOSIS — A6920 Lyme disease, unspecified: Secondary | ICD-10-CM | POA: Diagnosis not present

## 2013-05-12 DIAGNOSIS — Z7901 Long term (current) use of anticoagulants: Secondary | ICD-10-CM | POA: Diagnosis not present

## 2013-05-12 DIAGNOSIS — D509 Iron deficiency anemia, unspecified: Secondary | ICD-10-CM | POA: Diagnosis present

## 2013-05-12 DIAGNOSIS — Z86718 Personal history of other venous thrombosis and embolism: Secondary | ICD-10-CM | POA: Diagnosis not present

## 2013-05-12 DIAGNOSIS — Z86711 Personal history of pulmonary embolism: Secondary | ICD-10-CM | POA: Diagnosis not present

## 2013-05-12 DIAGNOSIS — K219 Gastro-esophageal reflux disease without esophagitis: Secondary | ICD-10-CM | POA: Diagnosis present

## 2013-05-12 DIAGNOSIS — F329 Major depressive disorder, single episode, unspecified: Secondary | ICD-10-CM | POA: Diagnosis present

## 2013-05-12 DIAGNOSIS — D649 Anemia, unspecified: Secondary | ICD-10-CM | POA: Diagnosis not present

## 2013-05-12 DIAGNOSIS — F29 Unspecified psychosis not due to a substance or known physiological condition: Secondary | ICD-10-CM | POA: Diagnosis not present

## 2013-05-12 DIAGNOSIS — IMO0001 Reserved for inherently not codable concepts without codable children: Secondary | ICD-10-CM | POA: Diagnosis not present

## 2013-05-12 DIAGNOSIS — R0989 Other specified symptoms and signs involving the circulatory and respiratory systems: Secondary | ICD-10-CM | POA: Diagnosis present

## 2013-05-12 DIAGNOSIS — F3289 Other specified depressive episodes: Secondary | ICD-10-CM | POA: Diagnosis present

## 2013-05-12 DIAGNOSIS — R0609 Other forms of dyspnea: Secondary | ICD-10-CM | POA: Diagnosis not present

## 2013-05-12 DIAGNOSIS — G40909 Epilepsy, unspecified, not intractable, without status epilepticus: Secondary | ICD-10-CM | POA: Diagnosis present

## 2013-05-12 DIAGNOSIS — I4891 Unspecified atrial fibrillation: Secondary | ICD-10-CM | POA: Diagnosis present

## 2013-05-12 DIAGNOSIS — R269 Unspecified abnormalities of gait and mobility: Secondary | ICD-10-CM | POA: Diagnosis not present

## 2013-05-12 DIAGNOSIS — R569 Unspecified convulsions: Secondary | ICD-10-CM | POA: Diagnosis not present

## 2013-05-12 DIAGNOSIS — I509 Heart failure, unspecified: Secondary | ICD-10-CM | POA: Diagnosis not present

## 2013-05-12 DIAGNOSIS — R079 Chest pain, unspecified: Secondary | ICD-10-CM | POA: Diagnosis not present

## 2013-05-12 DIAGNOSIS — R4182 Altered mental status, unspecified: Secondary | ICD-10-CM | POA: Diagnosis present

## 2013-05-12 DIAGNOSIS — E785 Hyperlipidemia, unspecified: Secondary | ICD-10-CM | POA: Diagnosis present

## 2013-05-12 DIAGNOSIS — I10 Essential (primary) hypertension: Secondary | ICD-10-CM | POA: Diagnosis present

## 2013-05-15 DIAGNOSIS — Z86711 Personal history of pulmonary embolism: Secondary | ICD-10-CM | POA: Diagnosis not present

## 2013-05-15 DIAGNOSIS — I4891 Unspecified atrial fibrillation: Secondary | ICD-10-CM | POA: Diagnosis not present

## 2013-05-15 DIAGNOSIS — I2510 Atherosclerotic heart disease of native coronary artery without angina pectoris: Secondary | ICD-10-CM | POA: Diagnosis not present

## 2013-05-15 DIAGNOSIS — E785 Hyperlipidemia, unspecified: Secondary | ICD-10-CM | POA: Diagnosis not present

## 2013-05-15 DIAGNOSIS — A6920 Lyme disease, unspecified: Secondary | ICD-10-CM | POA: Diagnosis not present

## 2013-05-15 DIAGNOSIS — D649 Anemia, unspecified: Secondary | ICD-10-CM | POA: Diagnosis not present

## 2013-05-15 DIAGNOSIS — M545 Low back pain, unspecified: Secondary | ICD-10-CM | POA: Diagnosis not present

## 2013-05-15 DIAGNOSIS — M6281 Muscle weakness (generalized): Secondary | ICD-10-CM | POA: Diagnosis not present

## 2013-05-15 DIAGNOSIS — IMO0001 Reserved for inherently not codable concepts without codable children: Secondary | ICD-10-CM | POA: Diagnosis not present

## 2013-05-15 DIAGNOSIS — R0609 Other forms of dyspnea: Secondary | ICD-10-CM | POA: Diagnosis not present

## 2013-05-15 DIAGNOSIS — R569 Unspecified convulsions: Secondary | ICD-10-CM | POA: Diagnosis not present

## 2013-05-15 DIAGNOSIS — Z86718 Personal history of other venous thrombosis and embolism: Secondary | ICD-10-CM | POA: Diagnosis not present

## 2013-05-15 DIAGNOSIS — Z79899 Other long term (current) drug therapy: Secondary | ICD-10-CM | POA: Diagnosis not present

## 2013-05-15 DIAGNOSIS — R269 Unspecified abnormalities of gait and mobility: Secondary | ICD-10-CM | POA: Diagnosis not present

## 2013-05-15 DIAGNOSIS — R4182 Altered mental status, unspecified: Secondary | ICD-10-CM | POA: Diagnosis not present

## 2013-05-15 DIAGNOSIS — G40909 Epilepsy, unspecified, not intractable, without status epilepticus: Secondary | ICD-10-CM | POA: Diagnosis not present

## 2013-05-15 DIAGNOSIS — Z7901 Long term (current) use of anticoagulants: Secondary | ICD-10-CM | POA: Diagnosis not present

## 2013-05-15 DIAGNOSIS — I10 Essential (primary) hypertension: Secondary | ICD-10-CM | POA: Diagnosis not present

## 2013-05-15 DIAGNOSIS — D509 Iron deficiency anemia, unspecified: Secondary | ICD-10-CM | POA: Diagnosis not present

## 2013-05-15 DIAGNOSIS — R079 Chest pain, unspecified: Secondary | ICD-10-CM | POA: Diagnosis not present

## 2013-05-15 DIAGNOSIS — I509 Heart failure, unspecified: Secondary | ICD-10-CM | POA: Diagnosis not present

## 2013-05-21 DIAGNOSIS — E785 Hyperlipidemia, unspecified: Secondary | ICD-10-CM | POA: Diagnosis not present

## 2013-05-21 DIAGNOSIS — M545 Low back pain, unspecified: Secondary | ICD-10-CM | POA: Diagnosis not present

## 2013-05-21 DIAGNOSIS — A6920 Lyme disease, unspecified: Secondary | ICD-10-CM | POA: Diagnosis not present

## 2013-05-21 DIAGNOSIS — R269 Unspecified abnormalities of gait and mobility: Secondary | ICD-10-CM | POA: Diagnosis not present

## 2013-05-21 DIAGNOSIS — D649 Anemia, unspecified: Secondary | ICD-10-CM | POA: Diagnosis not present

## 2013-05-21 DIAGNOSIS — I4891 Unspecified atrial fibrillation: Secondary | ICD-10-CM | POA: Diagnosis not present

## 2013-05-21 DIAGNOSIS — I10 Essential (primary) hypertension: Secondary | ICD-10-CM | POA: Diagnosis not present

## 2013-06-02 DIAGNOSIS — I4891 Unspecified atrial fibrillation: Secondary | ICD-10-CM | POA: Diagnosis not present

## 2013-06-02 DIAGNOSIS — G40909 Epilepsy, unspecified, not intractable, without status epilepticus: Secondary | ICD-10-CM | POA: Diagnosis not present

## 2013-06-02 DIAGNOSIS — I10 Essential (primary) hypertension: Secondary | ICD-10-CM | POA: Diagnosis not present

## 2013-06-02 DIAGNOSIS — I509 Heart failure, unspecified: Secondary | ICD-10-CM | POA: Diagnosis not present

## 2013-06-02 DIAGNOSIS — A6920 Lyme disease, unspecified: Secondary | ICD-10-CM | POA: Diagnosis not present

## 2013-06-04 DIAGNOSIS — A6920 Lyme disease, unspecified: Secondary | ICD-10-CM | POA: Diagnosis not present

## 2013-06-04 DIAGNOSIS — I6350 Cerebral infarction due to unspecified occlusion or stenosis of unspecified cerebral artery: Secondary | ICD-10-CM | POA: Diagnosis not present

## 2013-06-04 DIAGNOSIS — M545 Low back pain, unspecified: Secondary | ICD-10-CM | POA: Diagnosis not present

## 2013-06-04 DIAGNOSIS — I4891 Unspecified atrial fibrillation: Secondary | ICD-10-CM | POA: Diagnosis not present

## 2013-06-04 DIAGNOSIS — R569 Unspecified convulsions: Secondary | ICD-10-CM | POA: Diagnosis not present

## 2013-06-10 DIAGNOSIS — I4891 Unspecified atrial fibrillation: Secondary | ICD-10-CM | POA: Diagnosis not present

## 2013-06-10 DIAGNOSIS — I509 Heart failure, unspecified: Secondary | ICD-10-CM | POA: Diagnosis not present

## 2013-06-10 DIAGNOSIS — G40909 Epilepsy, unspecified, not intractable, without status epilepticus: Secondary | ICD-10-CM | POA: Diagnosis not present

## 2013-06-10 DIAGNOSIS — A6920 Lyme disease, unspecified: Secondary | ICD-10-CM | POA: Diagnosis not present

## 2013-06-10 DIAGNOSIS — I10 Essential (primary) hypertension: Secondary | ICD-10-CM | POA: Diagnosis not present

## 2013-06-11 DIAGNOSIS — I509 Heart failure, unspecified: Secondary | ICD-10-CM | POA: Diagnosis not present

## 2013-06-11 DIAGNOSIS — A6920 Lyme disease, unspecified: Secondary | ICD-10-CM | POA: Diagnosis not present

## 2013-06-11 DIAGNOSIS — I4891 Unspecified atrial fibrillation: Secondary | ICD-10-CM | POA: Diagnosis not present

## 2013-06-11 DIAGNOSIS — I10 Essential (primary) hypertension: Secondary | ICD-10-CM | POA: Diagnosis not present

## 2013-06-11 DIAGNOSIS — G40909 Epilepsy, unspecified, not intractable, without status epilepticus: Secondary | ICD-10-CM | POA: Diagnosis not present

## 2013-06-12 DIAGNOSIS — I4891 Unspecified atrial fibrillation: Secondary | ICD-10-CM | POA: Diagnosis not present

## 2013-06-12 DIAGNOSIS — I509 Heart failure, unspecified: Secondary | ICD-10-CM | POA: Diagnosis not present

## 2013-06-12 DIAGNOSIS — I10 Essential (primary) hypertension: Secondary | ICD-10-CM | POA: Diagnosis not present

## 2013-06-12 DIAGNOSIS — G40909 Epilepsy, unspecified, not intractable, without status epilepticus: Secondary | ICD-10-CM | POA: Diagnosis not present

## 2013-06-12 DIAGNOSIS — A6920 Lyme disease, unspecified: Secondary | ICD-10-CM | POA: Diagnosis not present

## 2013-06-13 DIAGNOSIS — R42 Dizziness and giddiness: Secondary | ICD-10-CM | POA: Diagnosis not present

## 2013-06-13 DIAGNOSIS — M545 Low back pain, unspecified: Secondary | ICD-10-CM | POA: Diagnosis not present

## 2013-06-13 DIAGNOSIS — A6920 Lyme disease, unspecified: Secondary | ICD-10-CM | POA: Diagnosis not present

## 2013-06-13 DIAGNOSIS — R51 Headache: Secondary | ICD-10-CM | POA: Diagnosis not present

## 2013-06-13 DIAGNOSIS — R569 Unspecified convulsions: Secondary | ICD-10-CM | POA: Diagnosis not present

## 2013-06-13 DIAGNOSIS — I4891 Unspecified atrial fibrillation: Secondary | ICD-10-CM | POA: Diagnosis not present

## 2013-06-13 DIAGNOSIS — G40909 Epilepsy, unspecified, not intractable, without status epilepticus: Secondary | ICD-10-CM | POA: Diagnosis not present

## 2013-06-13 DIAGNOSIS — I2510 Atherosclerotic heart disease of native coronary artery without angina pectoris: Secondary | ICD-10-CM | POA: Diagnosis not present

## 2013-06-13 DIAGNOSIS — I509 Heart failure, unspecified: Secondary | ICD-10-CM | POA: Diagnosis not present

## 2013-06-13 DIAGNOSIS — R079 Chest pain, unspecified: Secondary | ICD-10-CM | POA: Diagnosis not present

## 2013-06-13 DIAGNOSIS — I6350 Cerebral infarction due to unspecified occlusion or stenosis of unspecified cerebral artery: Secondary | ICD-10-CM | POA: Diagnosis not present

## 2013-06-13 DIAGNOSIS — I10 Essential (primary) hypertension: Secondary | ICD-10-CM | POA: Diagnosis not present

## 2013-06-14 DIAGNOSIS — R51 Headache: Secondary | ICD-10-CM | POA: Diagnosis present

## 2013-06-14 DIAGNOSIS — Z8701 Personal history of pneumonia (recurrent): Secondary | ICD-10-CM | POA: Diagnosis not present

## 2013-06-14 DIAGNOSIS — M5412 Radiculopathy, cervical region: Secondary | ICD-10-CM | POA: Diagnosis not present

## 2013-06-14 DIAGNOSIS — I509 Heart failure, unspecified: Secondary | ICD-10-CM | POA: Diagnosis present

## 2013-06-14 DIAGNOSIS — I6350 Cerebral infarction due to unspecified occlusion or stenosis of unspecified cerebral artery: Secondary | ICD-10-CM | POA: Diagnosis not present

## 2013-06-14 DIAGNOSIS — R0609 Other forms of dyspnea: Secondary | ICD-10-CM | POA: Diagnosis present

## 2013-06-14 DIAGNOSIS — R0989 Other specified symptoms and signs involving the circulatory and respiratory systems: Secondary | ICD-10-CM | POA: Diagnosis not present

## 2013-06-14 DIAGNOSIS — I4891 Unspecified atrial fibrillation: Secondary | ICD-10-CM | POA: Diagnosis not present

## 2013-06-14 DIAGNOSIS — M129 Arthropathy, unspecified: Secondary | ICD-10-CM | POA: Diagnosis present

## 2013-06-14 DIAGNOSIS — D649 Anemia, unspecified: Secondary | ICD-10-CM | POA: Diagnosis not present

## 2013-06-14 DIAGNOSIS — G8929 Other chronic pain: Secondary | ICD-10-CM | POA: Diagnosis present

## 2013-06-14 DIAGNOSIS — F3289 Other specified depressive episodes: Secondary | ICD-10-CM | POA: Diagnosis present

## 2013-06-14 DIAGNOSIS — G459 Transient cerebral ischemic attack, unspecified: Secondary | ICD-10-CM | POA: Diagnosis not present

## 2013-06-14 DIAGNOSIS — R0789 Other chest pain: Secondary | ICD-10-CM | POA: Diagnosis not present

## 2013-06-14 DIAGNOSIS — Z7901 Long term (current) use of anticoagulants: Secondary | ICD-10-CM | POA: Diagnosis not present

## 2013-06-14 DIAGNOSIS — F329 Major depressive disorder, single episode, unspecified: Secondary | ICD-10-CM | POA: Diagnosis present

## 2013-06-14 DIAGNOSIS — I6529 Occlusion and stenosis of unspecified carotid artery: Secondary | ICD-10-CM | POA: Diagnosis not present

## 2013-06-14 DIAGNOSIS — Z8249 Family history of ischemic heart disease and other diseases of the circulatory system: Secondary | ICD-10-CM | POA: Diagnosis not present

## 2013-06-14 DIAGNOSIS — Z8673 Personal history of transient ischemic attack (TIA), and cerebral infarction without residual deficits: Secondary | ICD-10-CM | POA: Diagnosis not present

## 2013-06-14 DIAGNOSIS — E785 Hyperlipidemia, unspecified: Secondary | ICD-10-CM | POA: Diagnosis present

## 2013-06-14 DIAGNOSIS — Z86711 Personal history of pulmonary embolism: Secondary | ICD-10-CM | POA: Diagnosis not present

## 2013-06-14 DIAGNOSIS — Z86718 Personal history of other venous thrombosis and embolism: Secondary | ICD-10-CM | POA: Diagnosis not present

## 2013-06-14 DIAGNOSIS — I059 Rheumatic mitral valve disease, unspecified: Secondary | ICD-10-CM | POA: Diagnosis not present

## 2013-06-14 DIAGNOSIS — R209 Unspecified disturbances of skin sensation: Secondary | ICD-10-CM | POA: Diagnosis not present

## 2013-06-14 DIAGNOSIS — I517 Cardiomegaly: Secondary | ICD-10-CM | POA: Diagnosis present

## 2013-06-14 DIAGNOSIS — I2510 Atherosclerotic heart disease of native coronary artery without angina pectoris: Secondary | ICD-10-CM | POA: Diagnosis not present

## 2013-06-14 DIAGNOSIS — I080 Rheumatic disorders of both mitral and aortic valves: Secondary | ICD-10-CM | POA: Diagnosis present

## 2013-06-14 DIAGNOSIS — I10 Essential (primary) hypertension: Secondary | ICD-10-CM | POA: Diagnosis not present

## 2013-06-14 DIAGNOSIS — M549 Dorsalgia, unspecified: Secondary | ICD-10-CM | POA: Diagnosis present

## 2013-06-14 DIAGNOSIS — R079 Chest pain, unspecified: Secondary | ICD-10-CM | POA: Diagnosis not present

## 2013-06-19 DIAGNOSIS — A6920 Lyme disease, unspecified: Secondary | ICD-10-CM | POA: Diagnosis not present

## 2013-06-19 DIAGNOSIS — I10 Essential (primary) hypertension: Secondary | ICD-10-CM | POA: Diagnosis not present

## 2013-06-19 DIAGNOSIS — I4891 Unspecified atrial fibrillation: Secondary | ICD-10-CM | POA: Diagnosis not present

## 2013-06-19 DIAGNOSIS — I509 Heart failure, unspecified: Secondary | ICD-10-CM | POA: Diagnosis not present

## 2013-06-19 DIAGNOSIS — G40909 Epilepsy, unspecified, not intractable, without status epilepticus: Secondary | ICD-10-CM | POA: Diagnosis not present

## 2013-06-23 DIAGNOSIS — I10 Essential (primary) hypertension: Secondary | ICD-10-CM | POA: Diagnosis not present

## 2013-06-23 DIAGNOSIS — I509 Heart failure, unspecified: Secondary | ICD-10-CM | POA: Diagnosis not present

## 2013-06-23 DIAGNOSIS — G40909 Epilepsy, unspecified, not intractable, without status epilepticus: Secondary | ICD-10-CM | POA: Diagnosis not present

## 2013-06-23 DIAGNOSIS — A6920 Lyme disease, unspecified: Secondary | ICD-10-CM | POA: Diagnosis not present

## 2013-06-23 DIAGNOSIS — I4891 Unspecified atrial fibrillation: Secondary | ICD-10-CM | POA: Diagnosis not present

## 2013-06-30 DIAGNOSIS — E785 Hyperlipidemia, unspecified: Secondary | ICD-10-CM | POA: Diagnosis not present

## 2013-06-30 DIAGNOSIS — I10 Essential (primary) hypertension: Secondary | ICD-10-CM | POA: Diagnosis not present

## 2013-06-30 DIAGNOSIS — I4891 Unspecified atrial fibrillation: Secondary | ICD-10-CM | POA: Diagnosis not present

## 2013-07-01 DIAGNOSIS — A6920 Lyme disease, unspecified: Secondary | ICD-10-CM | POA: Diagnosis not present

## 2013-07-01 DIAGNOSIS — I10 Essential (primary) hypertension: Secondary | ICD-10-CM | POA: Diagnosis not present

## 2013-07-01 DIAGNOSIS — I4891 Unspecified atrial fibrillation: Secondary | ICD-10-CM | POA: Diagnosis not present

## 2013-07-01 DIAGNOSIS — I509 Heart failure, unspecified: Secondary | ICD-10-CM | POA: Diagnosis not present

## 2013-07-01 DIAGNOSIS — G40909 Epilepsy, unspecified, not intractable, without status epilepticus: Secondary | ICD-10-CM | POA: Diagnosis not present

## 2013-07-02 DIAGNOSIS — I10 Essential (primary) hypertension: Secondary | ICD-10-CM | POA: Diagnosis not present

## 2013-07-02 DIAGNOSIS — I509 Heart failure, unspecified: Secondary | ICD-10-CM | POA: Diagnosis not present

## 2013-07-02 DIAGNOSIS — A6920 Lyme disease, unspecified: Secondary | ICD-10-CM | POA: Diagnosis not present

## 2013-07-02 DIAGNOSIS — I4891 Unspecified atrial fibrillation: Secondary | ICD-10-CM | POA: Diagnosis not present

## 2013-07-02 DIAGNOSIS — G40909 Epilepsy, unspecified, not intractable, without status epilepticus: Secondary | ICD-10-CM | POA: Diagnosis not present

## 2013-07-04 DIAGNOSIS — I509 Heart failure, unspecified: Secondary | ICD-10-CM | POA: Diagnosis not present

## 2013-07-04 DIAGNOSIS — I10 Essential (primary) hypertension: Secondary | ICD-10-CM | POA: Diagnosis not present

## 2013-07-04 DIAGNOSIS — G40909 Epilepsy, unspecified, not intractable, without status epilepticus: Secondary | ICD-10-CM | POA: Diagnosis not present

## 2013-07-04 DIAGNOSIS — I4891 Unspecified atrial fibrillation: Secondary | ICD-10-CM | POA: Diagnosis not present

## 2013-07-04 DIAGNOSIS — A6920 Lyme disease, unspecified: Secondary | ICD-10-CM | POA: Diagnosis not present

## 2013-07-07 DIAGNOSIS — R791 Abnormal coagulation profile: Secondary | ICD-10-CM | POA: Diagnosis not present

## 2013-07-07 DIAGNOSIS — K219 Gastro-esophageal reflux disease without esophagitis: Secondary | ICD-10-CM | POA: Diagnosis not present

## 2013-07-07 DIAGNOSIS — R079 Chest pain, unspecified: Secondary | ICD-10-CM | POA: Diagnosis not present

## 2013-07-07 DIAGNOSIS — R5383 Other fatigue: Secondary | ICD-10-CM | POA: Diagnosis not present

## 2013-07-07 DIAGNOSIS — J9801 Acute bronchospasm: Secondary | ICD-10-CM | POA: Diagnosis not present

## 2013-07-07 DIAGNOSIS — I10 Essential (primary) hypertension: Secondary | ICD-10-CM | POA: Diagnosis not present

## 2013-07-07 DIAGNOSIS — M25569 Pain in unspecified knee: Secondary | ICD-10-CM | POA: Diagnosis not present

## 2013-07-07 DIAGNOSIS — R5381 Other malaise: Secondary | ICD-10-CM | POA: Diagnosis not present

## 2013-07-07 DIAGNOSIS — M25469 Effusion, unspecified knee: Secondary | ICD-10-CM | POA: Diagnosis not present

## 2013-07-07 DIAGNOSIS — Z8673 Personal history of transient ischemic attack (TIA), and cerebral infarction without residual deficits: Secondary | ICD-10-CM | POA: Diagnosis not present

## 2013-07-07 DIAGNOSIS — M7989 Other specified soft tissue disorders: Secondary | ICD-10-CM | POA: Diagnosis not present

## 2013-07-07 DIAGNOSIS — Z86711 Personal history of pulmonary embolism: Secondary | ICD-10-CM | POA: Diagnosis not present

## 2013-07-07 DIAGNOSIS — R0789 Other chest pain: Secondary | ICD-10-CM | POA: Diagnosis not present

## 2013-07-08 DIAGNOSIS — I509 Heart failure, unspecified: Secondary | ICD-10-CM | POA: Diagnosis not present

## 2013-07-08 DIAGNOSIS — G40909 Epilepsy, unspecified, not intractable, without status epilepticus: Secondary | ICD-10-CM | POA: Diagnosis not present

## 2013-07-08 DIAGNOSIS — I10 Essential (primary) hypertension: Secondary | ICD-10-CM | POA: Diagnosis not present

## 2013-07-08 DIAGNOSIS — A6920 Lyme disease, unspecified: Secondary | ICD-10-CM | POA: Diagnosis not present

## 2013-07-08 DIAGNOSIS — I4891 Unspecified atrial fibrillation: Secondary | ICD-10-CM | POA: Diagnosis not present

## 2013-07-09 DIAGNOSIS — A6920 Lyme disease, unspecified: Secondary | ICD-10-CM | POA: Diagnosis not present

## 2013-07-09 DIAGNOSIS — I10 Essential (primary) hypertension: Secondary | ICD-10-CM | POA: Diagnosis not present

## 2013-07-09 DIAGNOSIS — G40909 Epilepsy, unspecified, not intractable, without status epilepticus: Secondary | ICD-10-CM | POA: Diagnosis not present

## 2013-07-09 DIAGNOSIS — I4891 Unspecified atrial fibrillation: Secondary | ICD-10-CM | POA: Diagnosis not present

## 2013-07-09 DIAGNOSIS — I509 Heart failure, unspecified: Secondary | ICD-10-CM | POA: Diagnosis not present

## 2013-07-11 DIAGNOSIS — G40909 Epilepsy, unspecified, not intractable, without status epilepticus: Secondary | ICD-10-CM | POA: Diagnosis not present

## 2013-07-11 DIAGNOSIS — I10 Essential (primary) hypertension: Secondary | ICD-10-CM | POA: Diagnosis not present

## 2013-07-11 DIAGNOSIS — I509 Heart failure, unspecified: Secondary | ICD-10-CM | POA: Diagnosis not present

## 2013-07-11 DIAGNOSIS — I4891 Unspecified atrial fibrillation: Secondary | ICD-10-CM | POA: Diagnosis not present

## 2013-07-11 DIAGNOSIS — A6920 Lyme disease, unspecified: Secondary | ICD-10-CM | POA: Diagnosis not present

## 2013-07-15 DIAGNOSIS — A6920 Lyme disease, unspecified: Secondary | ICD-10-CM | POA: Diagnosis not present

## 2013-07-15 DIAGNOSIS — G40909 Epilepsy, unspecified, not intractable, without status epilepticus: Secondary | ICD-10-CM | POA: Diagnosis not present

## 2013-07-15 DIAGNOSIS — I4891 Unspecified atrial fibrillation: Secondary | ICD-10-CM | POA: Diagnosis not present

## 2013-07-15 DIAGNOSIS — I509 Heart failure, unspecified: Secondary | ICD-10-CM | POA: Diagnosis not present

## 2013-07-15 DIAGNOSIS — I10 Essential (primary) hypertension: Secondary | ICD-10-CM | POA: Diagnosis not present

## 2013-07-15 DIAGNOSIS — Z5181 Encounter for therapeutic drug level monitoring: Secondary | ICD-10-CM | POA: Diagnosis not present

## 2013-07-15 LAB — PROTIME-INR: INR: 2.5 — AB (ref 0.9–1.1)

## 2013-07-22 DIAGNOSIS — I10 Essential (primary) hypertension: Secondary | ICD-10-CM | POA: Diagnosis not present

## 2013-07-22 DIAGNOSIS — I4891 Unspecified atrial fibrillation: Secondary | ICD-10-CM | POA: Diagnosis not present

## 2013-07-28 DIAGNOSIS — I10 Essential (primary) hypertension: Secondary | ICD-10-CM | POA: Diagnosis not present

## 2013-07-28 DIAGNOSIS — A6920 Lyme disease, unspecified: Secondary | ICD-10-CM | POA: Diagnosis not present

## 2013-07-28 DIAGNOSIS — I509 Heart failure, unspecified: Secondary | ICD-10-CM | POA: Diagnosis not present

## 2013-07-28 DIAGNOSIS — I4891 Unspecified atrial fibrillation: Secondary | ICD-10-CM | POA: Diagnosis not present

## 2013-07-28 DIAGNOSIS — G40909 Epilepsy, unspecified, not intractable, without status epilepticus: Secondary | ICD-10-CM | POA: Diagnosis not present

## 2013-08-01 DIAGNOSIS — I10 Essential (primary) hypertension: Secondary | ICD-10-CM | POA: Diagnosis not present

## 2013-08-01 DIAGNOSIS — N281 Cyst of kidney, acquired: Secondary | ICD-10-CM | POA: Diagnosis not present

## 2013-08-01 DIAGNOSIS — N289 Disorder of kidney and ureter, unspecified: Secondary | ICD-10-CM | POA: Diagnosis not present

## 2013-08-01 DIAGNOSIS — Q619 Cystic kidney disease, unspecified: Secondary | ICD-10-CM | POA: Diagnosis not present

## 2013-08-04 DIAGNOSIS — I10 Essential (primary) hypertension: Secondary | ICD-10-CM | POA: Diagnosis not present

## 2013-08-07 DIAGNOSIS — I10 Essential (primary) hypertension: Secondary | ICD-10-CM | POA: Diagnosis not present

## 2013-08-11 DIAGNOSIS — M25569 Pain in unspecified knee: Secondary | ICD-10-CM | POA: Diagnosis not present

## 2013-08-11 DIAGNOSIS — I4891 Unspecified atrial fibrillation: Secondary | ICD-10-CM | POA: Diagnosis not present

## 2013-08-11 DIAGNOSIS — F341 Dysthymic disorder: Secondary | ICD-10-CM | POA: Diagnosis not present

## 2013-08-12 DIAGNOSIS — I10 Essential (primary) hypertension: Secondary | ICD-10-CM | POA: Diagnosis not present

## 2013-08-14 DIAGNOSIS — M2390 Unspecified internal derangement of unspecified knee: Secondary | ICD-10-CM | POA: Diagnosis not present

## 2013-08-14 DIAGNOSIS — M25469 Effusion, unspecified knee: Secondary | ICD-10-CM | POA: Diagnosis not present

## 2013-08-14 DIAGNOSIS — M25569 Pain in unspecified knee: Secondary | ICD-10-CM | POA: Diagnosis not present

## 2013-08-15 DIAGNOSIS — M25469 Effusion, unspecified knee: Secondary | ICD-10-CM | POA: Diagnosis not present

## 2013-08-15 DIAGNOSIS — M25569 Pain in unspecified knee: Secondary | ICD-10-CM | POA: Diagnosis not present

## 2013-08-15 DIAGNOSIS — M1710 Unilateral primary osteoarthritis, unspecified knee: Secondary | ICD-10-CM | POA: Diagnosis not present

## 2013-08-20 DIAGNOSIS — I10 Essential (primary) hypertension: Secondary | ICD-10-CM | POA: Diagnosis not present

## 2013-08-22 DIAGNOSIS — I10 Essential (primary) hypertension: Secondary | ICD-10-CM | POA: Diagnosis not present

## 2013-08-29 DIAGNOSIS — M1710 Unilateral primary osteoarthritis, unspecified knee: Secondary | ICD-10-CM | POA: Diagnosis not present

## 2013-08-29 DIAGNOSIS — M5430 Sciatica, unspecified side: Secondary | ICD-10-CM | POA: Diagnosis not present

## 2013-08-29 DIAGNOSIS — M76899 Other specified enthesopathies of unspecified lower limb, excluding foot: Secondary | ICD-10-CM | POA: Diagnosis not present

## 2013-09-12 DIAGNOSIS — M25469 Effusion, unspecified knee: Secondary | ICD-10-CM | POA: Diagnosis not present

## 2013-09-12 DIAGNOSIS — M25569 Pain in unspecified knee: Secondary | ICD-10-CM | POA: Diagnosis not present

## 2013-09-12 DIAGNOSIS — M1710 Unilateral primary osteoarthritis, unspecified knee: Secondary | ICD-10-CM | POA: Diagnosis not present

## 2013-09-15 DIAGNOSIS — M2240 Chondromalacia patellae, unspecified knee: Secondary | ICD-10-CM | POA: Diagnosis not present

## 2013-09-15 DIAGNOSIS — IMO0002 Reserved for concepts with insufficient information to code with codable children: Secondary | ICD-10-CM | POA: Diagnosis not present

## 2013-09-15 DIAGNOSIS — M25469 Effusion, unspecified knee: Secondary | ICD-10-CM | POA: Diagnosis not present

## 2013-09-15 DIAGNOSIS — I4891 Unspecified atrial fibrillation: Secondary | ICD-10-CM | POA: Diagnosis not present

## 2013-09-15 DIAGNOSIS — Z7901 Long term (current) use of anticoagulants: Secondary | ICD-10-CM | POA: Diagnosis not present

## 2013-09-15 DIAGNOSIS — M25569 Pain in unspecified knee: Secondary | ICD-10-CM | POA: Diagnosis not present

## 2013-09-16 DIAGNOSIS — M1710 Unilateral primary osteoarthritis, unspecified knee: Secondary | ICD-10-CM | POA: Diagnosis not present

## 2013-09-16 DIAGNOSIS — M25569 Pain in unspecified knee: Secondary | ICD-10-CM | POA: Diagnosis not present

## 2013-09-16 DIAGNOSIS — M25469 Effusion, unspecified knee: Secondary | ICD-10-CM | POA: Diagnosis not present

## 2013-09-23 DIAGNOSIS — M25569 Pain in unspecified knee: Secondary | ICD-10-CM | POA: Diagnosis not present

## 2013-09-23 DIAGNOSIS — M6281 Muscle weakness (generalized): Secondary | ICD-10-CM | POA: Diagnosis not present

## 2013-09-23 DIAGNOSIS — IMO0001 Reserved for inherently not codable concepts without codable children: Secondary | ICD-10-CM | POA: Diagnosis not present

## 2013-09-23 DIAGNOSIS — I10 Essential (primary) hypertension: Secondary | ICD-10-CM | POA: Diagnosis not present

## 2013-09-23 DIAGNOSIS — R269 Unspecified abnormalities of gait and mobility: Secondary | ICD-10-CM | POA: Diagnosis not present

## 2013-09-24 DIAGNOSIS — Z7901 Long term (current) use of anticoagulants: Secondary | ICD-10-CM | POA: Diagnosis not present

## 2013-09-24 DIAGNOSIS — I4891 Unspecified atrial fibrillation: Secondary | ICD-10-CM | POA: Diagnosis not present

## 2013-10-01 DIAGNOSIS — Z7901 Long term (current) use of anticoagulants: Secondary | ICD-10-CM | POA: Diagnosis not present

## 2013-10-01 DIAGNOSIS — I4891 Unspecified atrial fibrillation: Secondary | ICD-10-CM | POA: Diagnosis not present

## 2013-12-18 DIAGNOSIS — K219 Gastro-esophageal reflux disease without esophagitis: Secondary | ICD-10-CM | POA: Diagnosis not present

## 2013-12-18 DIAGNOSIS — M1710 Unilateral primary osteoarthritis, unspecified knee: Secondary | ICD-10-CM | POA: Diagnosis not present

## 2013-12-18 DIAGNOSIS — E785 Hyperlipidemia, unspecified: Secondary | ICD-10-CM | POA: Diagnosis not present

## 2013-12-18 DIAGNOSIS — R269 Unspecified abnormalities of gait and mobility: Secondary | ICD-10-CM | POA: Diagnosis not present

## 2013-12-18 DIAGNOSIS — D509 Iron deficiency anemia, unspecified: Secondary | ICD-10-CM | POA: Diagnosis not present

## 2013-12-18 DIAGNOSIS — I4891 Unspecified atrial fibrillation: Secondary | ICD-10-CM | POA: Diagnosis not present

## 2013-12-18 DIAGNOSIS — Z1331 Encounter for screening for depression: Secondary | ICD-10-CM | POA: Diagnosis not present

## 2013-12-18 DIAGNOSIS — I10 Essential (primary) hypertension: Secondary | ICD-10-CM | POA: Diagnosis not present

## 2013-12-18 DIAGNOSIS — Q619 Cystic kidney disease, unspecified: Secondary | ICD-10-CM | POA: Diagnosis not present

## 2013-12-20 DIAGNOSIS — M25569 Pain in unspecified knee: Secondary | ICD-10-CM | POA: Diagnosis not present

## 2013-12-20 DIAGNOSIS — IMO0001 Reserved for inherently not codable concepts without codable children: Secondary | ICD-10-CM | POA: Diagnosis not present

## 2013-12-20 DIAGNOSIS — M542 Cervicalgia: Secondary | ICD-10-CM | POA: Diagnosis not present

## 2013-12-20 DIAGNOSIS — I509 Heart failure, unspecified: Secondary | ICD-10-CM | POA: Diagnosis not present

## 2013-12-20 DIAGNOSIS — I10 Essential (primary) hypertension: Secondary | ICD-10-CM | POA: Diagnosis not present

## 2013-12-20 DIAGNOSIS — R279 Unspecified lack of coordination: Secondary | ICD-10-CM | POA: Diagnosis not present

## 2013-12-20 DIAGNOSIS — M48061 Spinal stenosis, lumbar region without neurogenic claudication: Secondary | ICD-10-CM | POA: Diagnosis not present

## 2013-12-20 DIAGNOSIS — I4891 Unspecified atrial fibrillation: Secondary | ICD-10-CM | POA: Diagnosis not present

## 2013-12-22 DIAGNOSIS — M48061 Spinal stenosis, lumbar region without neurogenic claudication: Secondary | ICD-10-CM | POA: Diagnosis not present

## 2013-12-22 DIAGNOSIS — M25569 Pain in unspecified knee: Secondary | ICD-10-CM | POA: Diagnosis not present

## 2013-12-22 DIAGNOSIS — IMO0001 Reserved for inherently not codable concepts without codable children: Secondary | ICD-10-CM | POA: Diagnosis not present

## 2013-12-22 DIAGNOSIS — I509 Heart failure, unspecified: Secondary | ICD-10-CM | POA: Diagnosis not present

## 2013-12-22 DIAGNOSIS — R279 Unspecified lack of coordination: Secondary | ICD-10-CM | POA: Diagnosis not present

## 2013-12-22 DIAGNOSIS — M542 Cervicalgia: Secondary | ICD-10-CM | POA: Diagnosis not present

## 2013-12-24 DIAGNOSIS — IMO0001 Reserved for inherently not codable concepts without codable children: Secondary | ICD-10-CM | POA: Diagnosis not present

## 2013-12-24 DIAGNOSIS — R279 Unspecified lack of coordination: Secondary | ICD-10-CM | POA: Diagnosis not present

## 2013-12-24 DIAGNOSIS — M542 Cervicalgia: Secondary | ICD-10-CM | POA: Diagnosis not present

## 2013-12-24 DIAGNOSIS — M25569 Pain in unspecified knee: Secondary | ICD-10-CM | POA: Diagnosis not present

## 2013-12-24 DIAGNOSIS — M48061 Spinal stenosis, lumbar region without neurogenic claudication: Secondary | ICD-10-CM | POA: Diagnosis not present

## 2013-12-24 DIAGNOSIS — I509 Heart failure, unspecified: Secondary | ICD-10-CM | POA: Diagnosis not present

## 2013-12-26 DIAGNOSIS — IMO0001 Reserved for inherently not codable concepts without codable children: Secondary | ICD-10-CM | POA: Diagnosis not present

## 2013-12-26 DIAGNOSIS — M48061 Spinal stenosis, lumbar region without neurogenic claudication: Secondary | ICD-10-CM | POA: Diagnosis not present

## 2013-12-26 DIAGNOSIS — M542 Cervicalgia: Secondary | ICD-10-CM | POA: Diagnosis not present

## 2013-12-26 DIAGNOSIS — I509 Heart failure, unspecified: Secondary | ICD-10-CM | POA: Diagnosis not present

## 2013-12-26 DIAGNOSIS — R279 Unspecified lack of coordination: Secondary | ICD-10-CM | POA: Diagnosis not present

## 2013-12-26 DIAGNOSIS — M25569 Pain in unspecified knee: Secondary | ICD-10-CM | POA: Diagnosis not present

## 2013-12-31 DIAGNOSIS — Z01419 Encounter for gynecological examination (general) (routine) without abnormal findings: Secondary | ICD-10-CM | POA: Diagnosis not present

## 2013-12-31 DIAGNOSIS — Z124 Encounter for screening for malignant neoplasm of cervix: Secondary | ICD-10-CM | POA: Diagnosis not present

## 2014-01-01 DIAGNOSIS — I509 Heart failure, unspecified: Secondary | ICD-10-CM | POA: Diagnosis not present

## 2014-01-01 DIAGNOSIS — M542 Cervicalgia: Secondary | ICD-10-CM | POA: Diagnosis not present

## 2014-01-01 DIAGNOSIS — M48061 Spinal stenosis, lumbar region without neurogenic claudication: Secondary | ICD-10-CM | POA: Diagnosis not present

## 2014-01-01 DIAGNOSIS — IMO0001 Reserved for inherently not codable concepts without codable children: Secondary | ICD-10-CM | POA: Diagnosis not present

## 2014-01-01 DIAGNOSIS — M25569 Pain in unspecified knee: Secondary | ICD-10-CM | POA: Diagnosis not present

## 2014-01-01 DIAGNOSIS — R279 Unspecified lack of coordination: Secondary | ICD-10-CM | POA: Diagnosis not present

## 2014-01-02 DIAGNOSIS — M542 Cervicalgia: Secondary | ICD-10-CM | POA: Diagnosis not present

## 2014-01-02 DIAGNOSIS — I509 Heart failure, unspecified: Secondary | ICD-10-CM | POA: Diagnosis not present

## 2014-01-02 DIAGNOSIS — IMO0001 Reserved for inherently not codable concepts without codable children: Secondary | ICD-10-CM | POA: Diagnosis not present

## 2014-01-02 DIAGNOSIS — M48061 Spinal stenosis, lumbar region without neurogenic claudication: Secondary | ICD-10-CM | POA: Diagnosis not present

## 2014-01-02 DIAGNOSIS — R279 Unspecified lack of coordination: Secondary | ICD-10-CM | POA: Diagnosis not present

## 2014-01-02 DIAGNOSIS — M25569 Pain in unspecified knee: Secondary | ICD-10-CM | POA: Diagnosis not present

## 2014-01-21 ENCOUNTER — Ambulatory Visit (INDEPENDENT_AMBULATORY_CARE_PROVIDER_SITE_OTHER): Payer: Medicare Other | Admitting: Internal Medicine

## 2014-01-21 ENCOUNTER — Encounter: Payer: Self-pay | Admitting: Internal Medicine

## 2014-01-21 VITALS — BP 176/64 | HR 72 | Ht 62.0 in | Wt 130.2 lb

## 2014-01-21 DIAGNOSIS — R5381 Other malaise: Secondary | ICD-10-CM

## 2014-01-21 DIAGNOSIS — R5383 Other fatigue: Secondary | ICD-10-CM

## 2014-01-21 DIAGNOSIS — E785 Hyperlipidemia, unspecified: Secondary | ICD-10-CM | POA: Diagnosis not present

## 2014-01-21 DIAGNOSIS — R0602 Shortness of breath: Secondary | ICD-10-CM | POA: Diagnosis not present

## 2014-01-21 DIAGNOSIS — I4891 Unspecified atrial fibrillation: Secondary | ICD-10-CM | POA: Diagnosis not present

## 2014-01-21 DIAGNOSIS — I4819 Other persistent atrial fibrillation: Secondary | ICD-10-CM

## 2014-01-21 DIAGNOSIS — I5032 Chronic diastolic (congestive) heart failure: Secondary | ICD-10-CM | POA: Insufficient documentation

## 2014-01-21 DIAGNOSIS — I10 Essential (primary) hypertension: Secondary | ICD-10-CM

## 2014-01-21 DIAGNOSIS — R079 Chest pain, unspecified: Secondary | ICD-10-CM

## 2014-01-21 DIAGNOSIS — I480 Paroxysmal atrial fibrillation: Secondary | ICD-10-CM | POA: Insufficient documentation

## 2014-01-21 DIAGNOSIS — I509 Heart failure, unspecified: Secondary | ICD-10-CM | POA: Diagnosis not present

## 2014-01-21 DIAGNOSIS — D509 Iron deficiency anemia, unspecified: Secondary | ICD-10-CM | POA: Diagnosis not present

## 2014-01-21 DIAGNOSIS — R82998 Other abnormal findings in urine: Secondary | ICD-10-CM | POA: Diagnosis not present

## 2014-01-21 DIAGNOSIS — Z7901 Long term (current) use of anticoagulants: Secondary | ICD-10-CM | POA: Insufficient documentation

## 2014-01-21 DIAGNOSIS — I5030 Unspecified diastolic (congestive) heart failure: Secondary | ICD-10-CM | POA: Insufficient documentation

## 2014-01-21 DIAGNOSIS — R0789 Other chest pain: Secondary | ICD-10-CM | POA: Diagnosis not present

## 2014-01-21 NOTE — Progress Notes (Signed)
OFFICE NOTE  Chief Complaint:  Establish new cardiologist  Primary Care Physician: Velna Hatchet, MD  HPI:  [name] is a pleasant 73 year old female kindly referred to me be Dr. Ardeth Perfect. She reports a past medical history significant for atrial fibrillation and congestive heart failure, which occurred around the same time and probably 2012. She vaguely recalls seeing Dr. Irish Lack at that time.  She was started on warfarin therapy and no attempts were made to get her back into rhythm as she was paroxysmal. She does has a long-standing history of hypertension and dyslipidemia. She apparently developed heart failure or however has never had reassessment of her ejection fraction. She subsequently was having health issues and decided to move with her son to live in [place]. She says at that time she had some problems with chest pain and ultimately underwent a stress test. This was associated with an anaphylactic type reaction and despite their reassurances she will "no longer have any more stress test". She says she has a severe allergy to IV contrast dye which causes throat swelling as well as milk which also causes the same symptoms. Apparently she also underwent cardiac catheterization however she says that only medical therapy was recommended. This was in [place], [place].  We are trying to request records from there as well. She subsequently moved with her son to [facility], however he lost his job and she recently moved back to [place]. Currently she denies any significant shortness of breath but does report some mild leg edema which is slightly worse and occasional pain in her left chest and left arm over the last several days. She's also had pain in the right chest. None of those symptoms are worse with exertion or relieved by rest or medication.   PMHx:  Past Medical History  Diagnosis Date  . CHF (congestive heart failure)   . Hypertension     History reviewed. No pertinent  past surgical history.  FAMHx:  Family History  Problem Relation Age of Onset  . Stroke Mother   . Heart disease Mother   . Emphysema Father   . Ovarian cancer Sister   . Stroke Sister   . Other Child     died at birth    SOCHx:   reports that she has never smoked. She has never used smokeless tobacco. She reports that she does not drink alcohol or use illicit drugs.  ALLERGIES:  Allergies  Allergen Reactions  . Iodinated Diagnostic Agents Anaphylaxis    Info given by patient    ROS: A comprehensive review of systems was negative except for: Respiratory: positive for dyspnea on exertion Cardiovascular: positive for chest pressure/discomfort and lower extremity edema  HOME MEDS: Current Outpatient Prescriptions  Medication Sig Dispense Refill  . amLODipine (NORVASC) 10 MG tablet Take 10 mg by mouth daily.      . cloNIDine (CATAPRES) 0.1 MG tablet Take 0.1 mg by mouth 2 (two) times daily as needed.      . furosemide (LASIX) 40 MG tablet Take 40-80 mg by mouth daily.      [name] lacosamide (VIMPAT) 200 MG TABS tablet Take 200 mg by mouth 2 (two) times daily.      [name] lisinopril (PRINIVIL,ZESTRIL) 10 MG tablet Take 10 mg by mouth daily.      . metoprolol (LOPRESSOR) 50 MG tablet Take 50 mg by mouth 2 (two) times daily.      [name] oxycodone (OXY-IR) 5 MG capsule Take 5 mg by mouth  every 6 (six) hours as needed.      . pantoprazole (PROTONIX) 40 MG tablet Take 40 mg by mouth daily.      . simvastatin (ZOCOR) 20 MG tablet Take 20 mg by mouth daily.      . temazepam (RESTORIL) 15 MG capsule Take 15 mg by mouth at bedtime.      . tizanidine (ZANAFLEX) 2 MG capsule Take 2 mg by mouth every 6 (six) hours as needed for muscle spasms.      . vitamin B-12 (CYANOCOBALAMIN) 1000 MCG tablet Take 1,000 mcg by mouth daily.      [name] warfarin (COUMADIN) 5 MG tablet Take 5 mg by mouth. As directed per INR       No current facility-administered medications for this visit.    LABS/IMAGING: No results  found for this or any previous visit (from the past 48 hour(s)). No results found.  VITALS: BP 176/64  Pulse 72  Ht 5\' 2"  (1.575 m)  Wt 130 lb 3.2 oz (59.058 kg)  BMI 23.81 kg/m2  EXAM: General appearance: alert and no distress Neck: no carotid bruit and no JVD Lungs: clear to auscultation bilaterally Heart: regular rate and rhythm, S1, S2 normal and systolic murmur: early systolic 3/6, crescendo at lower left sternal border Abdomen: soft, non-tender; bowel sounds normal; no masses,  no organomegaly Extremities: extremities normal, atraumatic, no cyanosis or edema Pulses: 2+ and symmetric Skin: Skin color, texture, turgor normal. No rashes or lesions Neurologic: Grossly normal PSych: Normal  EKG: Normal sinus rhythm at 72, borderline high lateral T-wave abnormalities in 1 and aVF  ASSESSMENT: 1. Paroxysmal atrial fibrillation on warfarin 2. History of "congestive heart failure" 3. Chest pain with mild EKG abnormalities 4. Hypertension 5. Dyslipidemia  PLAN: 1.   [name] has a history of A. fib and possibly at tachycardia mediated cardiomyopathy in the past. It is unclear whether EF has improved. I will try to obtain records from [place] where she apparently had a stress test and heart catheterization. For now I would recommend an echocardiogram to see if there's been any improvement or change in her ejection fraction. This may help further guide medical therapy. She does have significant congestive heart failure, I may advise against using amlodipine. Her blood pressure is elevated today and may need to take extra clonidine that she uses as needed. I may need to further adjust her blood pressure medications. She is describing some left upper chest pain and pain in her left arm however she does have significant pain in both her hips and knees as well as arthritis over her whole body. She did describe right-sided chest pain last week. Her EKG shows a mild high lateral T-wave  abnormalities. It would be important to try to compare this to prior EKGs.    I'll plan to see her back to discuss results of her echocardiogram and review her medications. Her INR levels are being checked through [facility]   Thanks for referring her to establish cardiac care with me. She is very pleasant and I look forward to working with her.  Pixie Casino, MD, [facility] Attending Cardiologist [facility]  HILTY,Kenneth C 01/21/2014, 4:55 PM

## 2014-01-21 NOTE — Patient Instructions (Signed)
Your physician has requested that you have an echocardiogram. Echocardiography is a painless test that uses sound waves to create images of your heart. It provides your doctor with information about the size and shape of your heart and how well your heart's chambers and valves are working. This procedure takes approximately one hour. There are no restrictions for this procedure.  Your physician recommends that you schedule a follow-up appointment after your echocardiogram.

## 2014-01-28 ENCOUNTER — Other Ambulatory Visit: Payer: Self-pay | Admitting: Internal Medicine

## 2014-01-28 ENCOUNTER — Ambulatory Visit (HOSPITAL_COMMUNITY)
Admission: RE | Admit: 2014-01-28 | Discharge: 2014-01-28 | Disposition: A | Discharge status: Home / Self Care | Payer: Medicare Other | Source: Ambulatory Visit | Attending: Cardiovascular Disease | Admitting: Cardiovascular Disease

## 2014-01-28 DIAGNOSIS — I509 Heart failure, unspecified: Secondary | ICD-10-CM | POA: Insufficient documentation

## 2014-01-28 DIAGNOSIS — R0602 Shortness of breath: Secondary | ICD-10-CM | POA: Diagnosis not present

## 2014-01-28 DIAGNOSIS — R5381 Other malaise: Secondary | ICD-10-CM | POA: Diagnosis not present

## 2014-01-28 DIAGNOSIS — K219 Gastro-esophageal reflux disease without esophagitis: Secondary | ICD-10-CM | POA: Diagnosis not present

## 2014-01-28 DIAGNOSIS — I4819 Other persistent atrial fibrillation: Secondary | ICD-10-CM

## 2014-01-28 DIAGNOSIS — R0989 Other specified symptoms and signs involving the circulatory and respiratory systems: Secondary | ICD-10-CM | POA: Insufficient documentation

## 2014-01-28 DIAGNOSIS — M129 Arthropathy, unspecified: Secondary | ICD-10-CM | POA: Diagnosis not present

## 2014-01-28 DIAGNOSIS — I10 Essential (primary) hypertension: Secondary | ICD-10-CM | POA: Diagnosis not present

## 2014-01-28 DIAGNOSIS — R0609 Other forms of dyspnea: Secondary | ICD-10-CM | POA: Diagnosis not present

## 2014-01-28 DIAGNOSIS — R5383 Other fatigue: Secondary | ICD-10-CM

## 2014-01-28 DIAGNOSIS — I4891 Unspecified atrial fibrillation: Secondary | ICD-10-CM | POA: Diagnosis not present

## 2014-01-28 DIAGNOSIS — Z0000 Encounter for general adult medical examination without abnormal findings: Secondary | ICD-10-CM | POA: Diagnosis not present

## 2014-01-28 DIAGNOSIS — R3129 Other microscopic hematuria: Secondary | ICD-10-CM | POA: Diagnosis not present

## 2014-01-28 DIAGNOSIS — R079 Chest pain, unspecified: Secondary | ICD-10-CM

## 2014-01-28 DIAGNOSIS — I359 Nonrheumatic aortic valve disorder, unspecified: Secondary | ICD-10-CM

## 2014-01-28 DIAGNOSIS — E785 Hyperlipidemia, unspecified: Secondary | ICD-10-CM | POA: Diagnosis not present

## 2014-01-28 DIAGNOSIS — D509 Iron deficiency anemia, unspecified: Secondary | ICD-10-CM | POA: Diagnosis not present

## 2014-01-28 DIAGNOSIS — B009 Herpesviral infection, unspecified: Secondary | ICD-10-CM | POA: Diagnosis not present

## 2014-01-28 NOTE — Progress Notes (Signed)
2D Echocardiogram Complete.  01/28/2014   Sajad Glander [place], RDCS

## 2014-01-29 DIAGNOSIS — Z1212 Encounter for screening for malignant neoplasm of rectum: Secondary | ICD-10-CM | POA: Diagnosis not present

## 2014-01-30 ENCOUNTER — Telehealth: Payer: Self-pay | Admitting: Internal Medicine

## 2014-01-30 NOTE — Telephone Encounter (Signed)
12 pages of records received from [place] with [facility] [place] ( Cardiology) Given to [facility] in HIM for the patient's appointment on 9.3.15/ with Dr. Hilty:djc

## 2014-02-03 ENCOUNTER — Encounter (HOSPITAL_COMMUNITY): Payer: Medicare Other

## 2014-02-04 ENCOUNTER — Other Ambulatory Visit (HOSPITAL_COMMUNITY): Payer: Self-pay | Admitting: Internal Medicine

## 2014-02-04 DIAGNOSIS — Z7901 Long term (current) use of anticoagulants: Secondary | ICD-10-CM | POA: Diagnosis not present

## 2014-02-04 DIAGNOSIS — M810 Age-related osteoporosis without current pathological fracture: Secondary | ICD-10-CM | POA: Diagnosis not present

## 2014-02-04 DIAGNOSIS — I4891 Unspecified atrial fibrillation: Secondary | ICD-10-CM | POA: Diagnosis not present

## 2014-02-04 DIAGNOSIS — Z780 Asymptomatic menopausal state: Secondary | ICD-10-CM | POA: Diagnosis not present

## 2014-02-06 ENCOUNTER — Encounter (HOSPITAL_COMMUNITY): Payer: Self-pay

## 2014-02-06 ENCOUNTER — Ambulatory Visit (HOSPITAL_COMMUNITY)
Admission: RE | Admit: 2014-02-06 | Discharge: 2014-02-06 | Disposition: A | Discharge status: Home / Self Care | Payer: Medicare Other | Source: Ambulatory Visit | Attending: Internal Medicine | Admitting: Internal Medicine

## 2014-02-06 DIAGNOSIS — D649 Anemia, unspecified: Secondary | ICD-10-CM | POA: Diagnosis not present

## 2014-02-06 HISTORY — DX: Reserved for concepts with insufficient information to code with codable children: IMO0002

## 2014-02-06 HISTORY — DX: Anemia, unspecified: D64.9

## 2014-02-06 HISTORY — DX: Gastro-esophageal reflux disease without esophagitis: K21.9

## 2014-02-06 MED ORDER — SODIUM CHLORIDE 0.9 % IV SOLN: INTRAVENOUS | Status: AC

## 2014-02-06 MED ORDER — SODIUM CHLORIDE 0.9 % IV SOLN
1020.0000 mg | Freq: Once | INTRAVENOUS | Status: AC
  Administered 2014-02-06: 1020 mg via INTRAVENOUS
  Filled 2014-02-06: qty 34

## 2014-02-06 NOTE — Discharge Instructions (Signed)
°  FERAHEME °Ferumoxytol injection °What is this medicine? °FERUMOXYTOL is an iron complex. Iron is used to make healthy red blood cells, which carry oxygen and nutrients throughout the body. This medicine is used to treat iron deficiency anemia in people with chronic kidney disease. °This medicine may be used for other purposes; ask your health care provider or pharmacist if you have questions. °COMMON BRAND NAME(S): Feraheme °What should I tell my health care provider before I take this medicine? °They need to know if you have any of these conditions: °-anemia not caused by low iron levels °-high levels of iron in the blood °-magnetic resonance imaging (MRI) test scheduled °-an unusual or allergic reaction to iron, other medicines, foods, dyes, or preservatives °-pregnant or trying to get pregnant °-breast-feeding °How should I use this medicine? °This medicine is for injection into a vein. It is given by a health care professional in a hospital or clinic setting. °Talk to your pediatrician regarding the use of this medicine in children. Special care may be needed. °Overdosage: If you think you've taken too much of this medicine contact a poison control center or emergency room at once. °Overdosage: If you think you have taken too much of this medicine contact a poison control center or emergency room at once. °NOTE: This medicine is only for you. Do not share this medicine with others. °What if I miss a dose? °It is important not to miss your dose. Call your doctor or health care professional if you are unable to keep an appointment. °What may interact with this medicine? °This medicine may interact with the following medications: °-other iron products °This list may not describe all possible interactions. Give your health care provider a list of all the medicines, herbs, non-prescription drugs, or dietary supplements you use. Also tell them if you smoke, drink alcohol, or use illegal drugs. Some items may  interact with your medicine. °What should I watch for while using this medicine? °Visit your doctor or healthcare professional regularly. Tell your doctor or healthcare professional if your symptoms do not start to get better or if they get worse. You may need blood work done while you are taking this medicine. °You may need to follow a special diet. Talk to your doctor. Foods that contain iron include: whole grains/cereals, dried fruits, beans, or peas, leafy green vegetables, and organ meats (liver, kidney). °What side effects may I notice from receiving this medicine? °Side effects that you should report to your doctor or health care professional as soon as possible: °-allergic reactions like skin rash, itching or hives, swelling of the face, lips, or tongue °-breathing problems °-changes in blood pressure °-feeling faint or lightheaded, falls °-fever or chills °-flushing, sweating, or hot feelings °-swelling of the ankles or feet °Side effects that usually do not require medical attention (Report these to your doctor or health care professional if they continue or are bothersome.): °-diarrhea °-headache °-nausea, vomiting °-stomach pain °This list may not describe all possible side effects. Call your doctor for medical advice about side effects. You may report side effects to FDA at 1-800-FDA-1088. °Where should I keep my medicine? °This drug is given in a hospital or clinic and will not be stored at home. °NOTE: This sheet is a summary. It may not cover all possible information. If you have questions about this medicine, talk to your doctor, pharmacist, or health care provider. °© 2015, Elsevier/Gold Standard. (2012-02-02 15:23:36) ° °

## 2014-03-05 ENCOUNTER — Encounter: Payer: Self-pay | Admitting: Internal Medicine

## 2014-03-05 ENCOUNTER — Ambulatory Visit (INDEPENDENT_AMBULATORY_CARE_PROVIDER_SITE_OTHER): Payer: Medicare Other | Admitting: Internal Medicine

## 2014-03-05 VITALS — BP 170/60 | Ht 62.0 in | Wt 126.8 lb

## 2014-03-05 DIAGNOSIS — I509 Heart failure, unspecified: Secondary | ICD-10-CM

## 2014-03-05 DIAGNOSIS — I10 Essential (primary) hypertension: Secondary | ICD-10-CM | POA: Diagnosis not present

## 2014-03-05 DIAGNOSIS — I480 Paroxysmal atrial fibrillation: Secondary | ICD-10-CM

## 2014-03-05 DIAGNOSIS — Z7901 Long term (current) use of anticoagulants: Secondary | ICD-10-CM

## 2014-03-05 DIAGNOSIS — I359 Nonrheumatic aortic valve disorder, unspecified: Secondary | ICD-10-CM

## 2014-03-05 DIAGNOSIS — I350 Nonrheumatic aortic (valve) stenosis: Secondary | ICD-10-CM | POA: Insufficient documentation

## 2014-03-05 DIAGNOSIS — I4891 Unspecified atrial fibrillation: Secondary | ICD-10-CM

## 2014-03-05 NOTE — Patient Instructions (Addendum)
Dr.Hilty wants you to follow-up in: ONE YEAR . You will receive a reminder letter in the mail two months in advance. If you don't receive a letter, please call our office to schedule the follow-up appointment.  Your physician has requested that you have an echocardiogram. Echocardiography is a painless test that uses sound waves to create images of your heart. It provides your doctor with information about the size and shape of your heart and how well your heart's chambers and valves are working. This procedure takes approximately one hour. There are no restrictions for this procedure. Before your 1 year follow up

## 2014-03-05 NOTE — Progress Notes (Signed)
OFFICE NOTE  Chief Complaint:  Establish new cardiologist  Primary Care Physician: Velna Hatchet, MD  HPI:  [name] is a pleasant 73 year old female kindly referred to me be Dr. Ardeth Perfect. She reports a past medical history significant for atrial fibrillation and congestive heart failure, which occurred around the same time and probably 2012. She vaguely recalls seeing Dr. Irish Lack at that time.  She was started on warfarin therapy and no attempts were made to get her back into rhythm as she was paroxysmal. She does has a long-standing history of hypertension and dyslipidemia. She apparently developed heart failure or however has never had reassessment of her ejection fraction. She subsequently was having health issues and decided to move with her son to live in [place]. She says at that time she had some problems with chest pain and ultimately underwent a stress test. This was associated with an anaphylactic type reaction and despite their reassurances she will "no longer have any more stress test". She says she has a severe allergy to IV contrast dye which causes throat swelling as well as milk which also causes the same symptoms. Apparently she also underwent cardiac catheterization however she says that only medical therapy was recommended. This was in [place], [place].  We are trying to request records from there as well. She subsequently moved with her son to [facility], however he lost his job and she recently moved back to [place]. Currently she denies any significant shortness of breath but does report some mild leg edema which is slightly worse and occasional pain in her left chest and left arm over the last several days. She's also had pain in the right chest. None of those symptoms are worse with exertion or relieved by rest or medication.   [name] returns today for followup. She is notably upset and is concerned about some anxiety and her nerves. Her echo was somewhat  reassuring in that her EF has recovered to normal however she does have mild to moderate aortic stenosis which he was unaware of.  PMHx:  Past Medical History  Diagnosis Date  . CHF (congestive heart failure)   . Hypertension   . Anemia   . Broken back 2013  . Atrial fibrillation   . GERD (gastroesophageal reflux disease)     Past Surgical History  Procedure Laterality Date  . Lumbar disc surgery  1991  . Lumbar back surgery  2012  . Cervical disc surgery  2014  . Cholecystectomy  1980s  . Appendectomy  1950s  . Tonsillectomy and adenoidectomy  1960s  . Carpal tunnel release Bilateral 1990s    FAMHx:  Family History  Problem Relation Age of Onset  . Stroke Mother   . Heart disease Mother   . Emphysema Father   . Ovarian cancer Sister   . Stroke Sister   . Other Child     died at birth    SOCHx:   reports that she has never smoked. She has never used smokeless tobacco. She reports that she does not drink alcohol or use illicit drugs.  ALLERGIES:  Allergies  Allergen Reactions  . Iodinated Diagnostic Agents Anaphylaxis    Info given by patient  . Milk-Related Compounds     Lactose intolerance  . Darvon [Propoxyphene] Rash    ROS: A comprehensive review of systems was negative except for: Respiratory: positive for dyspnea on exertion Cardiovascular: positive for chest pressure/discomfort and lower extremity edema Behavioral/Psych: positive for anxiety  HOME MEDS:  Current Outpatient Prescriptions  Medication Sig Dispense Refill  . amLODipine (NORVASC) 10 MG tablet Take 10 mg by mouth daily.      . cloNIDine (CATAPRES) 0.1 MG tablet Take 0.1 mg by mouth 2 (two) times daily as needed.      . furosemide (LASIX) 40 MG tablet Take 40-80 mg by mouth daily.      [name] lacosamide (VIMPAT) 200 MG TABS tablet Take 200 mg by mouth 2 (two) times daily.      [name] lisinopril (PRINIVIL,ZESTRIL) 10 MG tablet Take 10 mg by mouth daily.      . metoprolol (LOPRESSOR) 50 MG tablet Take  50 mg by mouth 2 (two) times daily.      [name] oxycodone (OXY-IR) 5 MG capsule Take 5 mg by mouth every 6 (six) hours as needed.      . pantoprazole (PROTONIX) 40 MG tablet Take 40 mg by mouth daily.      . simvastatin (ZOCOR) 20 MG tablet Take 20 mg by mouth daily.      . temazepam (RESTORIL) 15 MG capsule Take 15 mg by mouth at bedtime.      . tizanidine (ZANAFLEX) 2 MG capsule Take 2 mg by mouth every 6 (six) hours as needed for muscle spasms.      . vitamin B-12 (CYANOCOBALAMIN) 1000 MCG tablet Take 1,000 mcg by mouth daily.      [name] warfarin (COUMADIN) 5 MG tablet Take 5 mg by mouth. As directed per INR       No current facility-administered medications for this visit.    LABS/IMAGING: No results found for this or any previous visit (from the past 48 hour(s)). No results found.  VITALS: BP 170/60  Ht 5\' 2"  (1.575 m)  Wt 126 lb 12.8 oz (57.516 kg)  BMI 23.19 kg/m2  EXAM: deferred  EKG: Normal sinus rhythm at 80  ASSESSMENT: 1. Paroxysmal atrial fibrillation on warfarin 2. History of "congestive heart failure" - EF 60-65% on echo 3. Hypertension 4. Dyslipidemia 5. Mild to moderate aortic stenosis  PLAN: 1.   [name] is maintaining sinus rhythm. She is on warfarin anticoagulation. She does have mild to moderate aortic stenosis. Her hypertension is well-controlled. Her echo is reassuring that her EF is now normalized. Will plan to see her back in a year with an echo shortly before that to see if his been any incremental change in her valve area. She is having some concerns about anxiety which I defer to her primary care provider.   Pixie Casino, MD, [facility] Attending Cardiologist [facility]  Dewaine Morocho C 03/05/2014, 10:21 AM

## 2014-03-12 ENCOUNTER — Encounter: Payer: Self-pay | Admitting: Interventional Cardiology

## 2014-03-12 ENCOUNTER — Encounter: Payer: Self-pay | Admitting: Internal Medicine

## 2014-04-13 DIAGNOSIS — Z7901 Long term (current) use of anticoagulants: Secondary | ICD-10-CM | POA: Diagnosis not present

## 2014-04-13 DIAGNOSIS — I4891 Unspecified atrial fibrillation: Secondary | ICD-10-CM | POA: Diagnosis not present

## 2014-05-06 ENCOUNTER — Other Ambulatory Visit: Payer: Self-pay | Admitting: Internal Medicine

## 2014-05-06 DIAGNOSIS — I4891 Unspecified atrial fibrillation: Secondary | ICD-10-CM | POA: Diagnosis not present

## 2014-05-06 DIAGNOSIS — I10 Essential (primary) hypertension: Secondary | ICD-10-CM | POA: Diagnosis not present

## 2014-05-06 DIAGNOSIS — N281 Cyst of kidney, acquired: Secondary | ICD-10-CM

## 2014-05-06 DIAGNOSIS — Z7901 Long term (current) use of anticoagulants: Secondary | ICD-10-CM | POA: Diagnosis not present

## 2014-05-06 DIAGNOSIS — I350 Nonrheumatic aortic (valve) stenosis: Secondary | ICD-10-CM | POA: Diagnosis not present

## 2014-05-06 DIAGNOSIS — I878 Other specified disorders of veins: Secondary | ICD-10-CM | POA: Diagnosis not present

## 2014-05-06 DIAGNOSIS — D509 Iron deficiency anemia, unspecified: Secondary | ICD-10-CM | POA: Diagnosis not present

## 2014-05-06 DIAGNOSIS — Z1231 Encounter for screening mammogram for malignant neoplasm of breast: Secondary | ICD-10-CM

## 2014-05-06 DIAGNOSIS — I509 Heart failure, unspecified: Secondary | ICD-10-CM | POA: Diagnosis not present

## 2014-05-06 DIAGNOSIS — E785 Hyperlipidemia, unspecified: Secondary | ICD-10-CM | POA: Diagnosis not present

## 2014-05-11 ENCOUNTER — Ambulatory Visit
Admission: RE | Admit: 2014-05-11 | Discharge: 2014-05-11 | Disposition: A | Discharge status: Home / Self Care | Payer: Medicare Other | Source: Ambulatory Visit | Attending: Internal Medicine | Admitting: Internal Medicine

## 2014-05-11 DIAGNOSIS — K449 Diaphragmatic hernia without obstruction or gangrene: Secondary | ICD-10-CM | POA: Diagnosis not present

## 2014-05-11 DIAGNOSIS — K5730 Diverticulosis of large intestine without perforation or abscess without bleeding: Secondary | ICD-10-CM | POA: Diagnosis not present

## 2014-05-11 DIAGNOSIS — N281 Cyst of kidney, acquired: Secondary | ICD-10-CM | POA: Diagnosis not present

## 2014-05-11 DIAGNOSIS — C44609 Unspecified malignant neoplasm of skin of left upper limb, including shoulder: Secondary | ICD-10-CM | POA: Diagnosis not present

## 2014-05-25 DIAGNOSIS — Z7901 Long term (current) use of anticoagulants: Secondary | ICD-10-CM | POA: Diagnosis not present

## 2014-08-03 ENCOUNTER — Ambulatory Visit (INDEPENDENT_AMBULATORY_CARE_PROVIDER_SITE_OTHER): Payer: 59 | Admitting: Family

## 2014-08-03 ENCOUNTER — Encounter: Payer: Self-pay | Admitting: Family

## 2014-08-03 ENCOUNTER — Other Ambulatory Visit (INDEPENDENT_AMBULATORY_CARE_PROVIDER_SITE_OTHER): Payer: 59

## 2014-08-03 VITALS — BP 162/72 | HR 56 | Temp 98.3°F | Resp 18 | Ht 62.0 in | Wt 127.0 lb

## 2014-08-03 DIAGNOSIS — M549 Dorsalgia, unspecified: Secondary | ICD-10-CM | POA: Insufficient documentation

## 2014-08-03 DIAGNOSIS — R52 Pain, unspecified: Secondary | ICD-10-CM

## 2014-08-03 DIAGNOSIS — I480 Paroxysmal atrial fibrillation: Secondary | ICD-10-CM

## 2014-08-03 DIAGNOSIS — G479 Sleep disorder, unspecified: Secondary | ICD-10-CM | POA: Diagnosis not present

## 2014-08-03 LAB — C-REACTIVE PROTEIN: CRP: 0.5 mg/dL (ref 0.5–20.0)

## 2014-08-03 LAB — SEDIMENTATION RATE: SED RATE: 13 mm/h (ref 0–22)

## 2014-08-03 LAB — PROTIME-INR
INR: 2.2 ratio — ABNORMAL HIGH (ref 0.8–1.0)
PROTHROMBIN TIME: 24.3 s — AB (ref 9.6–13.1)

## 2014-08-03 LAB — RHEUMATOID FACTOR

## 2014-08-03 MED ORDER — OXYCODONE HCL 5 MG PO CAPS: 5.0000 mg | ORAL_CAPSULE | Freq: Two times a day (BID) | ORAL | Status: DC | PRN

## 2014-08-03 MED ORDER — TEMAZEPAM 15 MG PO CAPS: 15.0000 mg | ORAL_CAPSULE | Freq: Every day | ORAL | Status: DC

## 2014-08-03 MED ORDER — AMLODIPINE BESYLATE 10 MG PO TABS: 10.0000 mg | ORAL_TABLET | Freq: Every day | ORAL | Status: DC

## 2014-08-03 MED ORDER — PANTOPRAZOLE SODIUM 40 MG PO TBEC: 40.0000 mg | DELAYED_RELEASE_TABLET | Freq: Every day | ORAL | Status: DC

## 2014-08-03 MED ORDER — SIMVASTATIN 20 MG PO TABS: 20.0000 mg | ORAL_TABLET | Freq: Every day | ORAL | Status: DC

## 2014-08-03 MED ORDER — METOPROLOL TARTRATE 50 MG PO TABS: 50.0000 mg | ORAL_TABLET | Freq: Two times a day (BID) | ORAL | Status: DC

## 2014-08-03 NOTE — Assessment & Plan Note (Signed)
Continues to experience sleep disturbances without temazepam. Refilled temazepam. Discussed with patient importance of sleep hygiene and decreasing stimulation to help sleep.

## 2014-08-03 NOTE — Patient Instructions (Addendum)
Thank you for choosing [facility].  Summary/Instructions:  Please make an appointment for your physical at your convenience.   Your prescription(s) have been submitted to your pharmacy or been printed and provided for you. Please take as directed and contact our office if you believe you are having problem(s) with the medication(s) or have any questions.  Please stop by the lab on the basement level of the building for your blood work. Your results will be released to [place] (or called to you) after review, usually within 72 hours after test completion. If any changes need to be made, you will be notified at that same time.  Referrals have been made during this visit. You should expect to hear back from our schedulers in about 7-10 days in regards to establishing an appointment with the specialists we discussed.   If your symptoms worsen or fail to improve, please contact our office for further instruction, or in case of emergency go directly to the emergency room at the closest medical facility.

## 2014-08-03 NOTE — Assessment & Plan Note (Addendum)
Stable with current treatment of warfarin. Obtain INR and adjustments per results. Refer to Coumadin clinic.

## 2014-08-03 NOTE — Progress Notes (Signed)
Pre visit review using our clinic review tool, if applicable. No additional management support is needed unless otherwise documented below in the visit note. 

## 2014-08-03 NOTE — Assessment & Plan Note (Signed)
Question origins of body pain. Obtain rheumatoid factor to rule out rheumatoid arthritis. Cannot rule out possible RSD or fibromyalgia. Refer to pain clinic for pain management. Refill oxycodone to supply patient to get pain clinic. Continue temazepam for sleep. Follow up in approximately one month.

## 2014-08-03 NOTE — Progress Notes (Signed)
Subjective:    Patient ID: [name], female    DOB: [birth year], 74 y.o.   MRN: [id number]  Chief Complaint  Patient presents with  . Establish Care    would like to have tamazapam to help sleep hasn't had it in a week, also in alot of pain due to arthritis    HPI:  [name] is a 74 y.o. female who presents today to establish care and discuss her issues sleeping.    1) Atrial fibrillation - currently anticoagulated with warfarin. Indicates her last INR check may have been in October.  2) Sleeping - indicates that she has difficulty falling asleep and has been treated with the temazepam. States she has been on it for a good while. Notes that she has been out of it for the last week and is currently sleeping 2-3 hours per night without it. With the temazepam she is able to get about 8 hours of sleep.  3)  Body pain - Indicates that her hands, feet and back that hurt especially at night when she goes to bed but the pain is fairly constant. . States that her hands and feet feel cold to the touch all the time. Indicates it has been going on in both hands and both feet for a good while.    Allergies  Allergen Reactions  . Iodinated Diagnostic Agents Anaphylaxis    Info given by patient  . Milk-Related Compounds     Lactose intolerance  . Darvon [Propoxyphene] Rash    Current Outpatient Prescriptions on File Prior to Visit  Medication Sig Dispense Refill  . amLODipine (NORVASC) 10 MG tablet Take 10 mg by mouth daily.    . cloNIDine (CATAPRES) 0.1 MG tablet Take 0.1 mg by mouth 2 (two) times daily as needed.    . furosemide (LASIX) 40 MG tablet Take 40-80 mg by mouth daily.    [name] lisinopril (PRINIVIL,ZESTRIL) 10 MG tablet Take 10 mg by mouth daily.    . metoprolol (LOPRESSOR) 50 MG tablet Take 50 mg by mouth 2 (two) times daily.    . pantoprazole (PROTONIX) 40 MG tablet Take 40 mg by mouth daily.    . simvastatin (ZOCOR) 20 MG tablet Take 20 mg by mouth daily.    .  temazepam (RESTORIL) 15 MG capsule Take 15 mg by mouth at bedtime.    . tizanidine (ZANAFLEX) 2 MG capsule Take 2 mg by mouth every 6 (six) hours as needed for muscle spasms.    . vitamin B-12 (CYANOCOBALAMIN) 1000 MCG tablet Take 1,000 mcg by mouth daily.    [name] warfarin (COUMADIN) 5 MG tablet Take 5 mg by mouth. As directed per INR     No current facility-administered medications on file prior to visit.    Past Medical History  Diagnosis Date  . CHF (congestive heart failure)   . Hypertension   . Anemia   . Broken back 2013  . Atrial fibrillation   . GERD (gastroesophageal reflux disease)     Past Surgical History  Procedure Laterality Date  . Lumbar disc surgery  1991  . Lumbar back surgery  2012  . Cervical disc surgery  2014  . Cholecystectomy  1980s  . Appendectomy  1950s  . Tonsillectomy and adenoidectomy  1960s  . Carpal tunnel release Bilateral 1990s    Family History  Problem Relation Age of Onset  . Stroke Mother   . Heart disease Mother   . Emphysema Father   .  Ovarian cancer Sister   . Stroke Sister   . Other Child     died at birth    History   Social History  . Marital Status: Divorced    Spouse Name: N/A    Number of Children: 3  . Years of Education: 14   Occupational History  . Retired    Social History Main Topics  . Smoking status: Never Smoker   . Smokeless tobacco: Never Used  . Alcohol Use: No  . Drug Use: No  . Sexual Activity: Not on file   Other Topics Concern  . Not on file   Social History Narrative   Born and raised in [place], [place]. Currently resides in a house with her son. 1 dog. Fun: go to church.   Denies religious beliefs that would effect health care.     Review of Systems  Constitutional: Negative for fever and fatigue.  Respiratory: Negative for chest tightness and shortness of breath.   Cardiovascular: Negative for chest pain, palpitations and leg swelling.  Musculoskeletal: Positive for back pain,  arthralgias and neck pain.  Neurological: Negative for headaches.      Objective:    BP 162/72 mmHg  Pulse 56  Temp(Src) 98.3 F (36.8 C) (Oral)  Resp 18  Ht 5\' 2"  (1.575 m)  Wt 127 lb (57.607 kg)  BMI 23.22 kg/m2  SpO2 95% Nursing note and vital signs reviewed.  Physical Exam  Constitutional: She is oriented to person, place, and time. She appears well-developed and well-nourished. No distress.  Cardiovascular: Normal rate, regular rhythm, normal heart sounds and intact distal pulses.   Pulmonary/Chest: Effort normal and breath sounds normal.  Musculoskeletal:  No obvious deformity, discoloration, or edema of lower back noted. Excessive tenderness and lumbar spine midline and paraspinal musculature. Reflexes are intact and appropriate bilaterally.  Bilateral wrists: No obvious deformity, discoloration, or edema of bilateral wrists noted. Excessive tenderness of bilateral wrists noted throughout examination. Patient does trace limited range of motion secondary to pain.  Neurological: She is alert and oriented to person, place, and time.  Skin: Skin is warm and dry.  Psychiatric: She has a normal mood and affect. Her behavior is normal. Judgment and thought content normal.       Assessment & Plan:

## 2014-08-04 ENCOUNTER — Encounter: Payer: Self-pay | Admitting: Family

## 2014-08-04 DIAGNOSIS — Z1231 Encounter for screening mammogram for malignant neoplasm of breast: Secondary | ICD-10-CM | POA: Diagnosis not present

## 2014-08-04 NOTE — Telephone Encounter (Signed)
Keep coumadin level as previously prescribed.

## 2014-08-24 ENCOUNTER — Inpatient Hospital Stay (HOSPITAL_COMMUNITY)
Admission: EM | Admit: 2014-08-24 | Discharge: 2014-08-28 | DRG: 195 | Disposition: A | Discharge status: Home Health | Payer: Medicare Other | Attending: Internal Medicine | Admitting: Internal Medicine

## 2014-08-24 ENCOUNTER — Emergency Department (HOSPITAL_COMMUNITY): Payer: Medicare Other

## 2014-08-24 ENCOUNTER — Observation Stay (HOSPITAL_COMMUNITY): Payer: Medicare Other

## 2014-08-24 ENCOUNTER — Encounter (HOSPITAL_COMMUNITY): Payer: Self-pay | Admitting: Emergency Medicine

## 2014-08-24 ENCOUNTER — Encounter: Payer: Self-pay | Admitting: Family

## 2014-08-24 ENCOUNTER — Ambulatory Visit (INDEPENDENT_AMBULATORY_CARE_PROVIDER_SITE_OTHER): Payer: 59 | Admitting: Family

## 2014-08-24 VITALS — BP 210/78 | HR 53 | Temp 97.6°F | Resp 18 | Ht 62.0 in | Wt 127.8 lb

## 2014-08-24 DIAGNOSIS — I10 Essential (primary) hypertension: Secondary | ICD-10-CM | POA: Diagnosis not present

## 2014-08-24 DIAGNOSIS — J181 Lobar pneumonia, unspecified organism: Secondary | ICD-10-CM | POA: Diagnosis not present

## 2014-08-24 DIAGNOSIS — R0789 Other chest pain: Secondary | ICD-10-CM | POA: Diagnosis not present

## 2014-08-24 DIAGNOSIS — Z888 Allergy status to other drugs, medicaments and biological substances status: Secondary | ICD-10-CM

## 2014-08-24 DIAGNOSIS — H8110 Benign paroxysmal vertigo, unspecified ear: Secondary | ICD-10-CM

## 2014-08-24 DIAGNOSIS — M1990 Unspecified osteoarthritis, unspecified site: Secondary | ICD-10-CM | POA: Diagnosis present

## 2014-08-24 DIAGNOSIS — I350 Nonrheumatic aortic (valve) stenosis: Secondary | ICD-10-CM

## 2014-08-24 DIAGNOSIS — E785 Hyperlipidemia, unspecified: Secondary | ICD-10-CM | POA: Diagnosis not present

## 2014-08-24 DIAGNOSIS — Z91041 Radiographic dye allergy status: Secondary | ICD-10-CM

## 2014-08-24 DIAGNOSIS — Z8041 Family history of malignant neoplasm of ovary: Secondary | ICD-10-CM

## 2014-08-24 DIAGNOSIS — M545 Low back pain: Secondary | ICD-10-CM | POA: Diagnosis not present

## 2014-08-24 DIAGNOSIS — Z823 Family history of stroke: Secondary | ICD-10-CM

## 2014-08-24 DIAGNOSIS — R42 Dizziness and giddiness: Secondary | ICD-10-CM | POA: Diagnosis not present

## 2014-08-24 DIAGNOSIS — Z79899 Other long term (current) drug therapy: Secondary | ICD-10-CM

## 2014-08-24 DIAGNOSIS — H538 Other visual disturbances: Secondary | ICD-10-CM | POA: Diagnosis not present

## 2014-08-24 DIAGNOSIS — R079 Chest pain, unspecified: Secondary | ICD-10-CM

## 2014-08-24 DIAGNOSIS — E860 Dehydration: Secondary | ICD-10-CM | POA: Diagnosis not present

## 2014-08-24 DIAGNOSIS — G43909 Migraine, unspecified, not intractable, without status migrainosus: Secondary | ICD-10-CM | POA: Diagnosis present

## 2014-08-24 DIAGNOSIS — I480 Paroxysmal atrial fibrillation: Secondary | ICD-10-CM

## 2014-08-24 DIAGNOSIS — K219 Gastro-esophageal reflux disease without esophagitis: Secondary | ICD-10-CM | POA: Diagnosis present

## 2014-08-24 DIAGNOSIS — R51 Headache: Secondary | ICD-10-CM | POA: Diagnosis not present

## 2014-08-24 DIAGNOSIS — M549 Dorsalgia, unspecified: Secondary | ICD-10-CM | POA: Diagnosis present

## 2014-08-24 DIAGNOSIS — I160 Hypertensive urgency: Secondary | ICD-10-CM

## 2014-08-24 DIAGNOSIS — G8929 Other chronic pain: Secondary | ICD-10-CM

## 2014-08-24 DIAGNOSIS — Z7901 Long term (current) use of anticoagulants: Secondary | ICD-10-CM

## 2014-08-24 DIAGNOSIS — Z79891 Long term (current) use of opiate analgesic: Secondary | ICD-10-CM

## 2014-08-24 DIAGNOSIS — Z825 Family history of asthma and other chronic lower respiratory diseases: Secondary | ICD-10-CM

## 2014-08-24 DIAGNOSIS — M542 Cervicalgia: Secondary | ICD-10-CM

## 2014-08-24 DIAGNOSIS — J189 Pneumonia, unspecified organism: Secondary | ICD-10-CM | POA: Diagnosis present

## 2014-08-24 DIAGNOSIS — I509 Heart failure, unspecified: Secondary | ICD-10-CM | POA: Diagnosis not present

## 2014-08-24 DIAGNOSIS — Z8249 Family history of ischemic heart disease and other diseases of the circulatory system: Secondary | ICD-10-CM

## 2014-08-24 DIAGNOSIS — Q064 Hydromyelia: Secondary | ICD-10-CM

## 2014-08-24 DIAGNOSIS — Z91011 Allergy to milk products: Secondary | ICD-10-CM

## 2014-08-24 DIAGNOSIS — R509 Fever, unspecified: Secondary | ICD-10-CM

## 2014-08-24 HISTORY — DX: Unspecified osteoarthritis, unspecified site: M19.90

## 2014-08-24 LAB — CBC WITH DIFFERENTIAL/PLATELET
Basophils Absolute: 0 10*3/uL (ref 0.0–0.1)
Basophils Relative: 0 % (ref 0–1)
EOS PCT: 1 % (ref 0–5)
Eosinophils Absolute: 0.1 10*3/uL (ref 0.0–0.7)
HCT: 37.5 % (ref 36.0–46.0)
HEMOGLOBIN: 13.2 g/dL (ref 12.0–15.0)
Lymphocytes Relative: 24 % (ref 12–46)
Lymphs Abs: 1.6 10*3/uL (ref 0.7–4.0)
MCH: 29.8 pg (ref 26.0–34.0)
MCHC: 35.2 g/dL (ref 30.0–36.0)
MCV: 84.7 fL (ref 78.0–100.0)
Monocytes Absolute: 0.5 10*3/uL (ref 0.1–1.0)
Monocytes Relative: 8 % (ref 3–12)
Neutro Abs: 4.2 10*3/uL (ref 1.7–7.7)
Neutrophils Relative %: 67 % (ref 43–77)
PLATELETS: 135 10*3/uL — AB (ref 150–400)
RBC: 4.43 MIL/uL (ref 3.87–5.11)
RDW: 12.5 % (ref 11.5–15.5)
WBC: 6.4 10*3/uL (ref 4.0–10.5)

## 2014-08-24 LAB — BASIC METABOLIC PANEL
ANION GAP: 5 (ref 5–15)
BUN: 14 mg/dL (ref 6–23)
CO2: 25 mmol/L (ref 19–32)
Calcium: 8.8 mg/dL (ref 8.4–10.5)
Chloride: 109 mmol/L (ref 96–112)
Creatinine, Ser: 0.76 mg/dL (ref 0.50–1.10)
GFR calc Af Amer: >90 mL/min (ref >90)
GFR, EST NON AFRICAN AMERICAN: 81 mL/min — AB (ref >90)
Glucose, Bld: 100 mg/dL — ABNORMAL HIGH (ref 70–99)
Potassium: 4.3 mmol/L (ref 3.5–5.1)
SODIUM: 139 mmol/L (ref 135–145)

## 2014-08-24 LAB — PROTIME-INR
INR: 2.29 — ABNORMAL HIGH (ref 0.00–1.49)
Prothrombin Time: 25.4 seconds — ABNORMAL HIGH (ref 11.6–15.2)

## 2014-08-24 LAB — TROPONIN I
Troponin I: 0.03 ng/mL (ref <0.031)
Troponin I: 0.03 ng/mL (ref <0.031)

## 2014-08-24 MED ORDER — OXYCODONE HCL 5 MG PO TABS
5.0000 mg | ORAL_TABLET | Freq: Two times a day (BID) | ORAL | Status: DC | PRN
  Administered 2014-08-25 – 2014-08-27 (×3): 5 mg via ORAL
  Filled 2014-08-24 (×3): qty 1

## 2014-08-24 MED ORDER — ONDANSETRON HCL 4 MG/2ML IJ SOLN: 4.0000 mg | Freq: Four times a day (QID) | INTRAMUSCULAR | Status: DC | PRN

## 2014-08-24 MED ORDER — PANTOPRAZOLE SODIUM 40 MG PO TBEC
40.0000 mg | DELAYED_RELEASE_TABLET | Freq: Every day | ORAL | Status: DC
  Administered 2014-08-24 – 2014-08-28 (×5): 40 mg via ORAL
  Filled 2014-08-24 (×5): qty 1

## 2014-08-24 MED ORDER — SODIUM CHLORIDE 0.9 % IJ SOLN
3.0000 mL | Freq: Two times a day (BID) | INTRAMUSCULAR | Status: DC
  Administered 2014-08-25 – 2014-08-28 (×3): 3 mL via INTRAVENOUS

## 2014-08-24 MED ORDER — SIMVASTATIN 20 MG PO TABS
20.0000 mg | ORAL_TABLET | Freq: Every day | ORAL | Status: DC
  Administered 2014-08-24 – 2014-08-28 (×5): 20 mg via ORAL
  Filled 2014-08-24 (×2): qty 2
  Filled 2014-08-24 (×2): qty 1
  Filled 2014-08-24: qty 2
  Filled 2014-08-24 (×2): qty 1

## 2014-08-24 MED ORDER — ALPRAZOLAM 0.25 MG PO TABS
0.2500 mg | ORAL_TABLET | Freq: Three times a day (TID) | ORAL | Status: DC | PRN
  Administered 2014-08-24 – 2014-08-26 (×3): 0.25 mg via ORAL
  Filled 2014-08-24 (×3): qty 1

## 2014-08-24 MED ORDER — NITROGLYCERIN 2 % TD OINT
1.0000 [in_us] | TOPICAL_OINTMENT | Freq: Four times a day (QID) | TRANSDERMAL | Status: DC
Start: 2014-08-24 — End: 2014-08-25
  Administered 2014-08-24: 1 [in_us] via TOPICAL
  Filled 2014-08-24: qty 30

## 2014-08-24 MED ORDER — WARFARIN - PHARMACIST DOSING INPATIENT: Freq: Every day | Status: DC

## 2014-08-24 MED ORDER — ONDANSETRON HCL 4 MG PO TABS
4.0000 mg | ORAL_TABLET | Freq: Four times a day (QID) | ORAL | Status: DC | PRN
  Administered 2014-08-25: 4 mg via ORAL
  Filled 2014-08-24: qty 1

## 2014-08-24 MED ORDER — ACETAMINOPHEN 650 MG RE SUPP: 650.0000 mg | Freq: Four times a day (QID) | RECTAL | Status: DC | PRN

## 2014-08-24 MED ORDER — TEMAZEPAM 15 MG PO CAPS: 15.0000 mg | ORAL_CAPSULE | Freq: Every day | ORAL | Status: DC

## 2014-08-24 MED ORDER — CLONIDINE HCL 0.1 MG PO TABS: 0.1000 mg | ORAL_TABLET | Freq: Two times a day (BID) | ORAL | Status: DC

## 2014-08-24 MED ORDER — FUROSEMIDE 40 MG PO TABS
40.0000 mg | ORAL_TABLET | Freq: Every day | ORAL | Status: DC
  Administered 2014-08-24 – 2014-08-26 (×3): 40 mg via ORAL
  Filled 2014-08-24 (×3): qty 1

## 2014-08-24 MED ORDER — METOCLOPRAMIDE HCL 10 MG PO TABS: 10.0000 mg | ORAL_TABLET | Freq: Four times a day (QID) | ORAL | Status: DC | PRN

## 2014-08-24 MED ORDER — METOPROLOL TARTRATE 25 MG PO TABS
50.0000 mg | ORAL_TABLET | Freq: Two times a day (BID) | ORAL | Status: DC
  Administered 2014-08-24 – 2014-08-25 (×2): 50 mg via ORAL
  Filled 2014-08-24 (×2): qty 2

## 2014-08-24 MED ORDER — CYCLOBENZAPRINE HCL 5 MG PO TABS
5.0000 mg | ORAL_TABLET | Freq: Three times a day (TID) | ORAL | Status: DC | PRN
  Administered 2014-08-25 – 2014-08-26 (×3): 5 mg via ORAL
  Filled 2014-08-24 (×3): qty 1

## 2014-08-24 MED ORDER — FUROSEMIDE 40 MG PO TABS: 40.0000 mg | ORAL_TABLET | Freq: Every day | ORAL | Status: DC

## 2014-08-24 MED ORDER — LISINOPRIL 20 MG PO TABS: 20.0000 mg | ORAL_TABLET | Freq: Every day | ORAL | Status: DC

## 2014-08-24 MED ORDER — BUTALBITAL-APAP-CAFFEINE 50-325-40 MG PO TABS: 1.0000 | ORAL_TABLET | ORAL | Status: DC | PRN

## 2014-08-24 MED ORDER — AMLODIPINE BESYLATE 10 MG PO TABS
10.0000 mg | ORAL_TABLET | Freq: Every day | ORAL | Status: DC
  Administered 2014-08-24 – 2014-08-28 (×5): 10 mg via ORAL
  Filled 2014-08-24 (×6): qty 1

## 2014-08-24 MED ORDER — LORAZEPAM 2 MG/ML IJ SOLN
0.5000 mg | Freq: Once | INTRAMUSCULAR | Status: AC
  Administered 2014-08-24: 0.5 mg via INTRAVENOUS
  Filled 2014-08-24: qty 1

## 2014-08-24 MED ORDER — MORPHINE SULFATE 2 MG/ML IJ SOLN
2.0000 mg | INTRAMUSCULAR | Status: DC | PRN
  Administered 2014-08-24 – 2014-08-25 (×5): 2 mg via INTRAVENOUS
  Filled 2014-08-24 (×5): qty 1

## 2014-08-24 MED ORDER — WARFARIN SODIUM 5 MG PO TABS
5.0000 mg | ORAL_TABLET | Freq: Once | ORAL | Status: AC
  Administered 2014-08-24: 5 mg via ORAL
  Filled 2014-08-24: qty 1

## 2014-08-24 MED ORDER — ACETAMINOPHEN 325 MG PO TABS
650.0000 mg | ORAL_TABLET | Freq: Four times a day (QID) | ORAL | Status: DC | PRN
  Administered 2014-08-26: 650 mg via ORAL
  Filled 2014-08-24: qty 2

## 2014-08-24 MED ORDER — LISINOPRIL 20 MG PO TABS
20.0000 mg | ORAL_TABLET | Freq: Every day | ORAL | Status: DC
  Administered 2014-08-25 – 2014-08-28 (×4): 20 mg via ORAL
  Filled 2014-08-24: qty 2
  Filled 2014-08-24 (×3): qty 1

## 2014-08-24 MED ORDER — HYDRALAZINE HCL 20 MG/ML IJ SOLN
10.0000 mg | Freq: Once | INTRAMUSCULAR | Status: AC
  Administered 2014-08-24: 10 mg via INTRAVENOUS
  Filled 2014-08-24: qty 1

## 2014-08-24 NOTE — ED Notes (Signed)
Hospitalist at bedside 

## 2014-08-24 NOTE — ED Notes (Signed)
Pt in MRI.

## 2014-08-24 NOTE — Patient Instructions (Addendum)
Please take an extra lisinopril tablet today and a clonidine tablet tonight.  Starting tomorrow - take 2 - 10 mg lisinopril tablets daily until completed and then pick up new prescription of 20 mg tablets and take 1 daily.  Use clonidine and furosemide as needed after tonight  If your symptoms worsen or fail to improve, please go directly to the Emergency Room - things to look for include "worst headache of your life", worsening of the blurred vision or worsening of the headache.

## 2014-08-24 NOTE — Progress Notes (Addendum)
ANTICOAGULATION CONSULT NOTE - Initial Consult  Pharmacy Consult for Warfarin Indication: Paroxysmal Atrial Fibrillation  Allergies  Allergen Reactions  . Iodinated Diagnostic Agents Anaphylaxis    Info given by patient  . Milk-Related Compounds     Lactose intolerance  . Darvon [Propoxyphene] Rash  . Hydralazine Anxiety    Facial flushing, pt prefers not to use it.     Patient Measurements:   Height: 5'2" Weight: 58 kg  Vital Signs: Temp: 97.8 F (36.6 C) (02/22 2016) Temp Source: Oral (02/22 2016) BP: 180/55 mmHg (02/22 2016) Pulse Rate: 76 (02/22 2016)  Labs:  Recent Labs  08/24/14 1631  HGB 13.2  HCT 37.5  PLT 135*  LABPROT 25.4*  INR 2.29*  CREATININE 0.76  TROPONINI 0.03    Estimated Creatinine Clearance: 48.8 mL/min (by C-G formula based on Cr of 0.76).   Medical History: Past Medical History  Diagnosis Date  . CHF (congestive heart failure)   . Hypertension   . Anemia   . Broken back 2013  . Atrial fibrillation   . GERD (gastroesophageal reflux disease)   . Arthritis     Assessment: 28 y/oF with PMH of CHF, essential HTN, anemia, PAF, GERD, and arthritis who presents with complaints of elevated blood pressure with symptoms of dizziness, lightheadedness, occipital headache, blurred vision, and generalized tingling and numbness. CT of head shows no acute intracranial abnormality. Pharmacy consulted to assist with dosing of Warfarin during inpatient stay.  Today, 08/24/2014:  INR 2.29, therapeutic  CBC: Hgb 13.2, Pltc 135 (low)  No bleeding issues noted  Drug Interactions: Fioricet (Butalbital component can potentially increase metabolism of Warfarin)  Goal of Therapy:  INR 2-3 Monitor platelets by anticoagulation protocol: Yes   Plan:   Warfarin 5 mg PO x 1 tonight as per home dose.  Daily PT/INR.  CBC at least q72h while inpatient.  Monitor for signs of bleeding.   Lindell Spar, PharmD, BCPS Pager: [phone] 08/24/2014 8:53  PM

## 2014-08-24 NOTE — Progress Notes (Signed)
Subjective:    Patient ID: [name], female    DOB: 04-21-1941, 74 y.o.   MRN: [id number]  Chief Complaint  Patient presents with  . Hypertension    pt states he BP has been in the 200s for the last 3 days, says that last night she was dizzy and her vision was blurred    HPI:  [name] is a 74 y.o. female who presents today for concerns about her blood pressure.  Indicates that she has experienced the associated symptom of increased blood pressure for the past 3 days with home systolic readings in the 660Y. Notes that she felt the additional symptoms of dizziness, and had some blurred vision last night which has since improved slightly. Modifying factors include amlodipine, metoprolol, and lisinopril. She also takes the clonidine as needed and has taken it for the last 3 days. Patient indicates that something does not feel right which is causing her concern. Denies worst headache of her life.   BP Readings from Last 3 Encounters:  08/24/14 210/78  08/03/14 162/72  03/05/14 170/60    Allergies  Allergen Reactions  . Iodinated Diagnostic Agents Anaphylaxis    Info given by patient  . Milk-Related Compounds     Lactose intolerance  . Darvon [Propoxyphene] Rash    Current Outpatient Prescriptions on File Prior to Visit  Medication Sig Dispense Refill  . amLODipine (NORVASC) 10 MG tablet Take 1 tablet (10 mg total) by mouth daily. 90 tablet 3  . cloNIDine (CATAPRES) 0.1 MG tablet Take 0.1 mg by mouth 2 (two) times daily as needed.    . furosemide (LASIX) 40 MG tablet Take 40-80 mg by mouth daily.    . metoCLOPramide (REGLAN) 10 MG tablet Take 10 mg by mouth every 6 (six) hours as needed for nausea.    . metoprolol (LOPRESSOR) 50 MG tablet Take 1 tablet (50 mg total) by mouth 2 (two) times daily. 180 tablet 3  . oxycodone (OXY-IR) 5 MG capsule Take 1 capsule (5 mg total) by mouth 2 (two) times daily as needed for pain. 60 capsule 0  . pantoprazole (PROTONIX) 40 MG  tablet Take 1 tablet (40 mg total) by mouth daily. 90 tablet 3  . simvastatin (ZOCOR) 20 MG tablet Take 1 tablet (20 mg total) by mouth daily. 90 tablet 3  . temazepam (RESTORIL) 15 MG capsule Take 1 capsule (15 mg total) by mouth at bedtime. 30 capsule 0  . tizanidine (ZANAFLEX) 2 MG capsule Take 2 mg by mouth every 6 (six) hours as needed for muscle spasms.    . traMADol (ULTRAM) 50 MG tablet Take by mouth every 6 (six) hours as needed.    . vitamin B-12 (CYANOCOBALAMIN) 1000 MCG tablet Take 1,000 mcg by mouth daily.    [name] warfarin (COUMADIN) 5 MG tablet Take 5 mg by mouth. As directed per INR    . [DISCONTINUED] lisinopril (PRINIVIL,ZESTRIL) 10 MG tablet Take 10 mg by mouth daily.     No current facility-administered medications on file prior to visit.    Review of Systems  Eyes:       Positive for blurred vision  Respiratory: Negative for chest tightness and shortness of breath.   Cardiovascular: Negative for chest pain, palpitations and leg swelling.  Musculoskeletal: Positive for neck pain.  Neurological: Positive for headaches.      Objective:    BP 210/78 mmHg  Pulse 53  Temp(Src) 97.6 F (36.4 C) (Oral)  Resp 18  Ht 5\' 2"  (1.575 m)  Wt 127 lb 12.8 oz (57.97 kg)  BMI 23.37 kg/m2  SpO2 97% Nursing note and vital signs reviewed.  Physical Exam  Constitutional: She is oriented to person, place, and time. She appears well-developed and well-nourished. No distress.  Cardiovascular: Normal rate, regular rhythm, normal heart sounds and intact distal pulses.   Pulmonary/Chest: Effort normal and breath sounds normal.  Neurological: She is alert and oriented to person, place, and time. No cranial nerve deficit. She exhibits normal muscle tone. Coordination normal.  Skin: Skin is warm and dry.  Psychiatric: She has a normal mood and affect. Her behavior is normal. Judgment and thought content normal.       Assessment & Plan:

## 2014-08-24 NOTE — H&P (Addendum)
[facility] History and Physical  Patient: [name]  MRN: [id number]  DOB: 01/16/1941  DOS: the patient was seen and examined on 08/24/2014 PCP: Mauricio Po, [name]  Chief Complaint: Dizziness and lightheadedness  HPI: [name] is a 74 y.o. female with Past medical history of essential hypertension, paroxysmal A. fib, GERD, arthritis with chronic back pain. The patient presented with complaints of elevated blood pressure with the symptoms of dizziness and lightheadedness. Patient mentions that she has taken some extra clonidine which was not able to control the blood pressure throughout the day. Her dizziness she describes as more lightheadedness occasionally she also has some vertigo. Her symptoms are worse when she is standing up or sitting up. She also complains of some on and off chest tightness. She complains of occipital headache. She complains of blurring of the vision which she describes as seeing through a prostatitis. She complains of generalized tingling and numbness. After receiving hydralazine in the ER she had her face flushed and was breathing heavily. She denies any similar high blood pressure situation in the past. She mentions she has been taking her medications regularly. She denies taking clonidine on a regular basis and mentions she is taking only only as needed as prescribed. She denies any fall trauma or injury. She denies any loss of control of bowel or bladder. No recent change in her medication. She mentions she recently has started taking turmeric does not use any other herbal supplements.   The patient is coming from home. And at her baseline independent for most of her ADL.  Review of Systems: as mentioned in the history of present illness.  A Comprehensive review of the other systems is negative.  Past Medical History  Diagnosis Date  . CHF (congestive heart failure)   . Hypertension   . Anemia   . Broken back 2013  . Atrial  fibrillation   . GERD (gastroesophageal reflux disease)   . Arthritis    Past Surgical History  Procedure Laterality Date  . Lumbar disc surgery  1991  . Lumbar back surgery  2012  . Cervical disc surgery  2014  . Cholecystectomy  1980s  . Appendectomy  1950s  . Tonsillectomy and adenoidectomy  1960s  . Carpal tunnel release Bilateral 1990s   Social History:  reports that she has never smoked. She has never used smokeless tobacco. She reports that she does not drink alcohol or use illicit drugs.  Allergies  Allergen Reactions  . Iodinated Diagnostic Agents Anaphylaxis    Info given by patient  . Milk-Related Compounds     Lactose intolerance  . Darvon [Propoxyphene] Rash  . Hydralazine Anxiety    Facial flushing, pt prefers not to use it.     Family History  Problem Relation Age of Onset  . Stroke Mother   . Heart disease Mother   . Emphysema Father   . Ovarian cancer Sister   . Stroke Sister   . Other Child     died at birth    Prior to Admission medications   Medication Sig Start Date End Date Taking? Authorizing Provider  amLODipine (NORVASC) 10 MG tablet Take 1 tablet (10 mg total) by mouth daily. 08/03/14  Yes Mauricio Po, [name]  cloNIDine (CATAPRES) 0.1 MG tablet Take 0.1 mg by mouth 2 (two) times daily as needed (blood pressure.).    Yes Historical Provider, MD  furosemide (LASIX) 40 MG tablet Take 1-2 tablets (40-80 mg total) by mouth daily. 08/24/14  Yes Mauricio Po, [name]  lisinopril (PRINIVIL,ZESTRIL) 20 MG tablet Take 1 tablet (20 mg total) by mouth daily. 08/24/14  Yes Mauricio Po, [name]  metoCLOPramide (REGLAN) 10 MG tablet Take 10 mg by mouth every 6 (six) hours as needed for nausea.   Yes Historical Provider, MD  metoprolol (LOPRESSOR) 50 MG tablet Take 1 tablet (50 mg total) by mouth 2 (two) times daily. 08/03/14  Yes Mauricio Po, [name]  oxycodone (OXY-IR) 5 MG capsule Take 1 capsule (5 mg total) by mouth 2 (two) times daily as needed for pain. 08/03/14  Yes  Mauricio Po, [name]  pantoprazole (PROTONIX) 40 MG tablet Take 1 tablet (40 mg total) by mouth daily. 08/03/14  Yes Mauricio Po, [name]  simvastatin (ZOCOR) 20 MG tablet Take 1 tablet (20 mg total) by mouth daily. 08/03/14  Yes Mauricio Po, [name]  temazepam (RESTORIL) 15 MG capsule Take 1 capsule (15 mg total) by mouth at bedtime. 08/03/14  Yes Mauricio Po, [name]  traMADol (ULTRAM) 50 MG tablet Take by mouth every 6 (six) hours as needed.   Yes Historical Provider, MD  vitamin B-12 (CYANOCOBALAMIN) 1000 MCG tablet Take 1,000 mcg by mouth daily.   Yes Historical Provider, MD  warfarin (COUMADIN) 5 MG tablet Take 5 mg by mouth at bedtime. As directed per INR   Yes Historical Provider, MD  tizanidine (ZANAFLEX) 2 MG capsule Take 2 mg by mouth every 6 (six) hours as needed for muscle spasms.    Historical Provider, MD    Physical Exam: Filed Vitals:   08/24/14 1839 08/24/14 1939 08/24/14 2013 08/24/14 2016  BP: 210/57 218/62  180/55  Pulse: 57 77  76  Temp: 98.6 F (37 C)  97.8 F (36.6 C) 97.8 F (36.6 C)  TempSrc: Oral   Oral  Resp: 16 20  15   SpO2: 98% 97%  96%    General: Alert, Awake and Oriented to Time, Place and Person. Appear in mild distress Eyes: PERRL ENT: Oral Mucosa clear moist. Neck: no JVD Cardiovascular: S1 and S2 Present, aortic systolic Murmur, Peripheral Pulses Present Respiratory: Bilateral Air entry equal and Decreased, Clear to Auscultation, noCrackles, no wheezes Abdomen: Bowel Sound present, Soft and non tender Skin: no Rash Extremities: no Pedal edema, no calf tenderness Point tenderness on the left gluteal region Neurologic: Mental status appears anxious, Cranial Nerves pupils are reactive cough reflex present, Motor strength bilaterally equal strength 4 x 5 upper and lower extremity, Sensation tingling sensation on the left upper extremity, reflexes present knee, Cerebellar test normal finger-nose-finger bilaterally.  Labs on Admission:  CBC:  Recent  Labs Lab 08/24/14 1631  WBC 6.4  NEUTROABS 4.2  HGB 13.2  HCT 37.5  MCV 84.7  PLT 135*    CMP     Component Value Date/Time   NA 139 08/24/2014 1631   K 4.3 08/24/2014 1631   CL 109 08/24/2014 1631   CO2 25 08/24/2014 1631   GLUCOSE 100* 08/24/2014 1631   BUN 14 08/24/2014 1631   CREATININE 0.76 08/24/2014 1631   CALCIUM 8.8 08/24/2014 1631   PROT 6.0 10/10/2010 1256   ALBUMIN 3.4* 10/10/2010 1256   AST 18 10/10/2010 1256   ALT 15 10/10/2010 1256   ALKPHOS 73 10/10/2010 1256   BILITOT 0.5 10/10/2010 1256   GFRNONAA 81* 08/24/2014 1631   GFRAA >90 08/24/2014 1631    No results for input(s): LIPASE, AMYLASE in the last 168 hours.   Recent Labs Lab 08/24/14 1631  TROPONINI 0.03   BNP (  last 3 results) No results for input(s): BNP in the last 8760 hours.  ProBNP (last 3 results) No results for input(s): PROBNP in the last 8760 hours.   Radiological Exams on Admission: Ct Head Wo Contrast  08/24/2014   CLINICAL DATA:  Hypertension. Blurred vision. Dizziness. Head and neck pain.  EXAM: CT HEAD WITHOUT CONTRAST  TECHNIQUE: Contiguous axial images were obtained from the base of the skull through the vertex without intravenous contrast.  COMPARISON:  10/02/2006  FINDINGS: Sinuses/Soft tissues: Clear paranasal sinuses and mastoid air cells.  Intracranial: Vertebral basilar atherosclerosis. Remote right cerebellar hemisphere and basal ganglia infarcts. No mass lesion, hemorrhage, hydrocephalus, acute infarct, intra-axial, or extra-axial fluid collection.  IMPRESSION: 1.  No acute intracranial abnormality. 2. Remote right cerebellar and basal ganglia infarcts.   Electronically Signed   By: Abigail Miyamoto M.D.   On: 08/24/2014 17:34    EKG: Independently reviewed. normal sinus rhythm, nonspecific ST and T waves changes.  Assessment/Plan Principal Problem:   Hypertensive urgency Active Problems:   Chest pain   Aortic stenosis   Back pain   Paroxysmal atrial  fibrillation   1. Hypertensive urgency The patient is presenting with complaints of high blood pressure. She has been using clonidine as needed but it is not helping to control her blood pressure. Along with that she also has complaints of dizziness lightheadedness, occipital headache, blurred vision. CT of the head is a negative for any acute abnormality including SAH. Her symptoms are more likely secondary to high blood pressure than sequelae of an acute intracranial abnormality. We will get MRI to rule out any other intracranial abnormality. Patient will be in the step down unit. I will continue her home medication and use nitroglycerin ointment to control her blood pressure. Serial neuro checks. Goal blood pressure 147W to 295A systolic.  2. Aortic stenosis. Continue close monitoring. Echocardiogram July 15 EF 65-70%, mild to moderate stenosis  3. Back pain. Using OxyIR and Flexeril as needed.  4. Paroxysmal A. fib. Continue metoprolol. Continue warfarin.  Advance goals of care discussion: Full code   Consults: Phone consultation with neurology  DVT Prophylaxis:on chronic anticoagulation Nutrition: Cardiac diet, after swallowing evaluation   Family Communication: Family  was present at bedside, opportunity was given to ask question and all questions were answered satisfactorily at the time of interview. Disposition: Admitted to observation in step-down unit.  Author: Berle Mull, MD [facility] Pager: [phone] 08/24/2014, 8:34 PM    If 7PM-7AM, please contact night-coverage [email or web address] Password [email or web address]

## 2014-08-24 NOTE — ED Provider Notes (Addendum)
CSN: [id number]     Arrival date & time 08/24/14  1438 History   First MD Initiated Contact with Patient 08/24/14 1611     Chief Complaint  Patient presents with  . Hypertension     (Consider location/radiation/quality/duration/timing/severity/associated sxs/prior Treatment) Patient is a 74 y.o. female presenting with hypertension. The history is provided by the patient.  Hypertension This is a recurrent problem. Associated symptoms include headaches. Pertinent negatives include no chest pain, no abdominal pain and no shortness of breath.   patient presents with a headache and hypertension. States she has had some dizziness and blurred vision. Consent from primary care because of her hypertension and headache with worry for intracranial hemorrhage since she is on Coumadin. States the headache began gradually couple days ago. States her blood pressure has been elevated in she's had a take her clonidine that she has for as needed HTN. No chest pain. Trouble breathing. No swelling or legs. She saw her PCP and has plans on what to do with medication adjustment if she is cleared. The headache is dull and throbbing. It is somewhat on the back of her head and neck. States she can tell when her blood pressure goes high but usually does not feel like this.  Past Medical History  Diagnosis Date  . CHF (congestive heart failure)   . Hypertension   . Anemia   . Broken back 2013  . Atrial fibrillation   . GERD (gastroesophageal reflux disease)   . Arthritis    Past Surgical History  Procedure Laterality Date  . Lumbar disc surgery  1991  . Lumbar back surgery  2012  . Cervical disc surgery  2014  . Cholecystectomy  1980s  . Appendectomy  1950s  . Tonsillectomy and adenoidectomy  1960s  . Carpal tunnel release Bilateral 1990s   Family History  Problem Relation Age of Onset  . Stroke Mother   . Heart disease Mother   . Emphysema Father   . Ovarian cancer Sister   . Stroke Sister   . Other  Child     died at birth   History  Substance Use Topics  . Smoking status: Never Smoker   . Smokeless tobacco: Never Used  . Alcohol Use: No   OB History    No data available     Review of Systems  Constitutional: Negative for activity change and appetite change.  Eyes: Negative for pain.  Respiratory: Negative for chest tightness and shortness of breath.   Cardiovascular: Negative for chest pain and leg swelling.  Gastrointestinal: Negative for nausea, vomiting, abdominal pain and diarrhea.  Genitourinary: Negative for flank pain.  Musculoskeletal: Positive for neck pain. Negative for back pain and neck stiffness.  Skin: Negative for rash.  Neurological: Positive for dizziness and headaches. Negative for weakness and numbness.  Psychiatric/Behavioral: Negative for behavioral problems.      Allergies  Iodinated diagnostic agents; Milk-related compounds; Darvon; and Hydralazine  Home Medications   Prior to Admission medications   Medication Sig Start Date End Date Taking? Authorizing Provider  amLODipine (NORVASC) 10 MG tablet Take 1 tablet (10 mg total) by mouth daily. 08/03/14  Yes Mauricio Po, [name]  furosemide (LASIX) 40 MG tablet Take 1-2 tablets (40-80 mg total) by mouth daily. 08/24/14  Yes Mauricio Po, [name]  lisinopril (PRINIVIL,ZESTRIL) 20 MG tablet Take 1 tablet (20 mg total) by mouth daily. 08/24/14  Yes Mauricio Po, [name]  metoCLOPramide (REGLAN) 10 MG tablet Take 10 mg by mouth every 6 (six)  hours as needed for nausea.   Yes Historical Provider, MD  oxycodone (OXY-IR) 5 MG capsule Take 1 capsule (5 mg total) by mouth 2 (two) times daily as needed for pain. 08/03/14  Yes Mauricio Po, [name]  pantoprazole (PROTONIX) 40 MG tablet Take 1 tablet (40 mg total) by mouth daily. 08/03/14  Yes Mauricio Po, [name]  simvastatin (ZOCOR) 20 MG tablet Take 1 tablet (20 mg total) by mouth daily. 08/03/14  Yes Mauricio Po, [name]  temazepam (RESTORIL) 15 MG capsule Take 1 capsule (15  mg total) by mouth at bedtime. 08/03/14  Yes Mauricio Po, [name]  traMADol (ULTRAM) 50 MG tablet Take by mouth every 6 (six) hours as needed.   Yes Historical Provider, MD  vitamin B-12 (CYANOCOBALAMIN) 1000 MCG tablet Take 1,000 mcg by mouth daily.   Yes Historical Provider, MD  amoxicillin-clavulanate (AUGMENTIN) 875-125 MG per tablet Take 1 tablet by mouth every 12 (twelve) hours. For 6 more days 08/28/14   Bonnielee Haff, MD  butalbital-acetaminophen-caffeine (FIORICET, [facility]) [phone] MG per tablet Take 1 tablet by mouth every 4 (four) hours as needed for headache. 08/28/14   Bonnielee Haff, MD  cloNIDine (CATAPRES) 0.1 MG tablet Take 1 tablet (0.1 mg total) by mouth daily. 08/28/14   Bonnielee Haff, MD  gabapentin (NEURONTIN) 100 MG capsule Take 1 capsule (100 mg total) by mouth 3 (three) times daily. 08/28/14   Bonnielee Haff, MD  meclizine (ANTIVERT) 25 MG tablet Take 1 tablet (25 mg total) by mouth 3 (three) times daily as needed for dizziness. 08/28/14   Bonnielee Haff, MD  metoprolol (LOPRESSOR) 25 MG tablet Take 1 tablet (25 mg total) by mouth 2 (two) times daily. 08/28/14   Bonnielee Haff, MD  polyethylene glycol (MIRALAX / GLYCOLAX) packet Take 17 g by mouth daily as needed for moderate constipation. 08/28/14   Bonnielee Haff, MD  tizanidine (ZANAFLEX) 2 MG capsule Take 2 mg by mouth every 6 (six) hours as needed for muscle spasms.    Historical Provider, MD  warfarin (COUMADIN) 5 MG tablet Take 1 tablet (5 mg total) by mouth at bedtime. As directed per INR. Please do not take on 2/26 and 2/27. Resume on 08/30/14. INR check on Monday or Tuesday. 08/28/14   Bonnielee Haff, MD   BP 129/68 mmHg  Pulse 56  Temp(Src) 97.8 F (36.6 C) (Oral)  Resp 18  Ht 5\' 2"  (1.575 m)  Wt 129 lb 3 oz (58.6 kg)  BMI 23.62 kg/m2  SpO2 99% Physical Exam  Constitutional: She is oriented to person, place, and time. She appears well-developed and well-nourished.  HENT:  Head: Normocephalic and atraumatic.  Eyes:  Pupils are equal, round, and reactive to light.  Neck: Neck supple. No JVD present.  Cardiovascular: Normal rate, regular rhythm and normal heart sounds.   No murmur heard. Pulmonary/Chest: Effort normal and breath sounds normal. No respiratory distress. She has no wheezes.  Abdominal: Soft. Bowel sounds are normal. She exhibits no distension.  Musculoskeletal: Normal range of motion.  Neurological: She is alert and oriented to person, place, and time. No cranial nerve deficit.  Skin: Skin is warm and dry.  Psychiatric: She has a normal mood and affect. Her speech is normal.  Nursing note and vitals reviewed.   ED Course  Procedures (including critical care time) Labs Review Labs Reviewed  CBC WITH DIFFERENTIAL/PLATELET - Abnormal; Notable for the following:    Platelets 135 (*)    All other components within normal limits  BASIC METABOLIC PANEL -  Abnormal; Notable for the following:    Glucose, Bld 100 (*)    GFR calc non Af Amer 81 (*)    All other components within normal limits  PROTIME-INR - Abnormal; Notable for the following:    Prothrombin Time 25.4 (*)    INR 2.29 (*)    All other components within normal limits  COMPREHENSIVE METABOLIC PANEL - Abnormal; Notable for the following:    Glucose, Bld 159 (*)    GFR calc non Af Amer 64 (*)    GFR calc Af Amer 74 (*)    All other components within normal limits  PROTIME-INR - Abnormal; Notable for the following:    Prothrombin Time 25.3 (*)    INR 2.28 (*)    All other components within normal limits  PROTIME-INR - Abnormal; Notable for the following:    Prothrombin Time 26.9 (*)    INR 2.47 (*)    All other components within normal limits  BASIC METABOLIC PANEL - Abnormal; Notable for the following:    Glucose, Bld 119 (*)    BUN 24 (*)    Creatinine, Ser 1.26 (*)    GFR calc non Af Amer 41 (*)    GFR calc Af Amer 47 (*)    All other components within normal limits  C-REACTIVE PROTEIN - Abnormal; Notable for the  following:    CRP <0.5 (*)    All other components within normal limits  URINALYSIS, ROUTINE W REFLEX MICROSCOPIC - Abnormal; Notable for the following:    APPearance CLOUDY (*)    Hgb urine dipstick TRACE (*)    Leukocytes, UA TRACE (*)    All other components within normal limits  PROTIME-INR - Abnormal; Notable for the following:    Prothrombin Time 29.5 (*)    INR 2.77 (*)    All other components within normal limits  BASIC METABOLIC PANEL - Abnormal; Notable for the following:    Glucose, Bld 113 (*)    BUN 24 (*)    Calcium 8.1 (*)    GFR calc non Af Amer 61 (*)    GFR calc Af Amer 71 (*)    All other components within normal limits  URINE MICROSCOPIC-ADD ON - Abnormal; Notable for the following:    Squamous Epithelial / LPF FEW (*)    All other components within normal limits  CBC - Abnormal; Notable for the following:    Platelets 143 (*)    All other components within normal limits  PROTIME-INR - Abnormal; Notable for the following:    Prothrombin Time 31.0 (*)    INR 2.96 (*)    All other components within normal limits  CBC - Abnormal; Notable for the following:    WBC 3.9 (*)    Platelets 123 (*)    All other components within normal limits  BASIC METABOLIC PANEL - Abnormal; Notable for the following:    Glucose, Bld 103 (*)    GFR calc non Af Amer 63 (*)    GFR calc Af Amer 73 (*)    All other components within normal limits  MRSA PCR SCREENING  CULTURE, BLOOD (ROUTINE X 2)  CULTURE, BLOOD (ROUTINE X 2)  TROPONIN I  TROPONIN I  TROPONIN I  CBC  DIFFERENTIAL  CBC  SEDIMENTATION RATE  LACTIC ACID, PLASMA    Imaging Review Dg Chest Port 1 View  08/26/2014   CLINICAL DATA:  Fever.  EXAM: PORTABLE CHEST - 1 VIEW  COMPARISON:  10/19/2010  FINDINGS: Heart and mediastinal contours are within normal limits. Left lung is clear. Vague opacity projects over the right upper lobe. Cannot exclude right upper lobe infiltrate. No effusions. No acute bony abnormality.   IMPRESSION: Vague opacity in the right upper lobe, question pneumonia.   Electronically Signed   By: Rolm Baptise M.D.   On: 08/26/2014 16:07     EKG Interpretation   Date/Time:  Monday August 24 2014 19:39:54 EST Ventricular Rate:  78 PR Interval:  174 QRS Duration: 76 QT Interval:  396 QTC Calculation: 451 R Axis:   48 Text Interpretation:  Sinus rhythm Atrial premature complex ED PHYSICIAN  INTERPRETATION AVAILABLE IN [facility] Confirmed by TEST, Record  ([id number]) on 08/26/2014 7:15:20 AM      MDM   Final diagnoses:  Essential hypertension  Hypertensive urgency    Patient with headache and dizziness. Sent in from PCP for possible bleed since patient is on Coumadin. She is hypertensive. She does feel lightheaded with standing denies capillary. Blood pressure appears to be still elevated. Has had a few readings that were lower but manual levels appear to be consistently high. Will admit to internal medicine.    Jasper Riling. Alvino Chapel, MD 08/28/14 [place] Alvino Chapel, MD 08/28/14 1524

## 2014-08-24 NOTE — Progress Notes (Signed)
Pre visit review using our clinic review tool, if applicable. No additional management support is needed unless otherwise documented below in the visit note. 

## 2014-08-24 NOTE — ED Notes (Signed)
Pt c/o hypertension, systolic BP 828 states her PCP sent her directly here today. Pt states she takes clonidine without success, c/o blurred vision, dizziness, head and neck pain.

## 2014-08-24 NOTE — Assessment & Plan Note (Addendum)
Blood pressure today is elevated 210/78 and repeated at the same. Neurological exam normal. Concern for neck pain, headache and blurred vision and on coumadin and cannot rule out cerebral hemorrhage or SAH.  Discussed treatment options with patient and believe it would be in her best intentions to go over to the Emergency Room for further evaluation and work-up. Emergency Room notified of patient. Follow up pending ED visit.

## 2014-08-24 NOTE — ED Notes (Signed)
Patient is less anxious as before, chest pain is 7/10, described as pressure, with less shortness of breath. Repositioned patient on stretcher. She is resting.

## 2014-08-24 NOTE — ED Notes (Addendum)
Pt is complaining of pressure on the bridge of nose, shortness of breath (Lungs sounds clear in all lobes) slightly labored, anxious, and chest pain that is described as pressure. Pt states this symptoms started after the Hydralazine was administered. Informed Dr. Delane Ginger. Pickering of patients condition. New orders obtained.

## 2014-08-25 ENCOUNTER — Telehealth: Payer: Self-pay | Admitting: Family

## 2014-08-25 ENCOUNTER — Observation Stay (HOSPITAL_COMMUNITY): Payer: Medicare Other

## 2014-08-25 DIAGNOSIS — M4802 Spinal stenosis, cervical region: Secondary | ICD-10-CM | POA: Diagnosis not present

## 2014-08-25 DIAGNOSIS — Q064 Hydromyelia: Secondary | ICD-10-CM | POA: Diagnosis not present

## 2014-08-25 DIAGNOSIS — I10 Essential (primary) hypertension: Secondary | ICD-10-CM | POA: Diagnosis not present

## 2014-08-25 DIAGNOSIS — M4692 Unspecified inflammatory spondylopathy, cervical region: Secondary | ICD-10-CM | POA: Diagnosis not present

## 2014-08-25 LAB — CBC
HCT: 37.7 % (ref 36.0–46.0)
Hemoglobin: 12.7 g/dL (ref 12.0–15.0)
MCH: 28.9 pg (ref 26.0–34.0)
MCHC: 33.7 g/dL (ref 30.0–36.0)
MCV: 85.9 fL (ref 78.0–100.0)
PLATELETS: 150 10*3/uL (ref 150–400)
RBC: 4.39 MIL/uL (ref 3.87–5.11)
RDW: 12.6 % (ref 11.5–15.5)
WBC: 4.9 10*3/uL (ref 4.0–10.5)

## 2014-08-25 LAB — COMPREHENSIVE METABOLIC PANEL
ALT: 14 U/L (ref 0–35)
ANION GAP: 6 (ref 5–15)
AST: 19 U/L (ref 0–37)
Albumin: 3.6 g/dL (ref 3.5–5.2)
Alkaline Phosphatase: 77 U/L (ref 39–117)
BILIRUBIN TOTAL: 0.6 mg/dL (ref 0.3–1.2)
BUN: 14 mg/dL (ref 6–23)
CHLORIDE: 105 mmol/L (ref 96–112)
CO2: 27 mmol/L (ref 19–32)
Calcium: 8.8 mg/dL (ref 8.4–10.5)
Creatinine, Ser: 0.87 mg/dL (ref 0.50–1.10)
GFR calc Af Amer: 74 mL/min — ABNORMAL LOW (ref >90)
GFR calc non Af Amer: 64 mL/min — ABNORMAL LOW (ref >90)
Glucose, Bld: 159 mg/dL — ABNORMAL HIGH (ref 70–99)
POTASSIUM: 3.7 mmol/L (ref 3.5–5.1)
Sodium: 138 mmol/L (ref 135–145)
Total Protein: 6.2 g/dL (ref 6.0–8.3)

## 2014-08-25 LAB — DIFFERENTIAL
Basophils Absolute: 0 10*3/uL (ref 0.0–0.1)
Basophils Relative: 0 % (ref 0–1)
Eosinophils Absolute: 0.1 10*3/uL (ref 0.0–0.7)
Eosinophils Relative: 2 % (ref 0–5)
Lymphocytes Relative: 37 % (ref 12–46)
Lymphs Abs: 1.9 10*3/uL (ref 0.7–4.0)
MONOS PCT: 10 % (ref 3–12)
Monocytes Absolute: 0.5 10*3/uL (ref 0.1–1.0)
NEUTROS ABS: 2.6 10*3/uL (ref 1.7–7.7)
Neutrophils Relative %: 51 % (ref 43–77)

## 2014-08-25 LAB — PROTIME-INR
INR: 2.28 — ABNORMAL HIGH (ref 0.00–1.49)
PROTHROMBIN TIME: 25.3 s — AB (ref 11.6–15.2)

## 2014-08-25 LAB — MRSA PCR SCREENING: MRSA by PCR: NEGATIVE

## 2014-08-25 LAB — TROPONIN I: Troponin I: 0.03 ng/mL (ref <0.031)

## 2014-08-25 MED ORDER — METOPROLOL TARTRATE 25 MG PO TABS
25.0000 mg | ORAL_TABLET | Freq: Two times a day (BID) | ORAL | Status: DC
  Administered 2014-08-25 – 2014-08-28 (×6): 25 mg via ORAL
  Filled 2014-08-25 (×6): qty 1

## 2014-08-25 MED ORDER — WARFARIN SODIUM 5 MG PO TABS
5.0000 mg | ORAL_TABLET | Freq: Once | ORAL | Status: AC
  Administered 2014-08-25: 5 mg via ORAL
  Filled 2014-08-25: qty 1

## 2014-08-25 MED ORDER — GABAPENTIN 100 MG PO CAPS
100.0000 mg | ORAL_CAPSULE | Freq: Three times a day (TID) | ORAL | Status: DC
  Administered 2014-08-25 – 2014-08-28 (×9): 100 mg via ORAL
  Filled 2014-08-25 (×10): qty 1

## 2014-08-25 MED ORDER — GADOBENATE DIMEGLUMINE 529 MG/ML IV SOLN
15.0000 mL | Freq: Once | INTRAVENOUS | Status: AC | PRN
  Administered 2014-08-25: 12 mL via INTRAVENOUS

## 2014-08-25 MED ORDER — KETOROLAC TROMETHAMINE 30 MG/ML IJ SOLN
30.0000 mg | Freq: Four times a day (QID) | INTRAMUSCULAR | Status: DC | PRN
  Administered 2014-08-25 (×2): 30 mg via INTRAVENOUS
  Filled 2014-08-25: qty 1

## 2014-08-25 MED ORDER — CLONIDINE HCL 0.1 MG PO TABS
0.1000 mg | ORAL_TABLET | Freq: Every day | ORAL | Status: DC
  Administered 2014-08-26 – 2014-08-28 (×3): 0.1 mg via ORAL
  Filled 2014-08-25 (×3): qty 1

## 2014-08-25 NOTE — Progress Notes (Signed)
ANTICOAGULATION CONSULT NOTE   Pharmacy Consult for Warfarin Indication: Paroxysmal Atrial Fibrillation  Allergies  Allergen Reactions  . Iodinated Diagnostic Agents Anaphylaxis    Info given by patient  . Milk-Related Compounds     Lactose intolerance  . Darvon [Propoxyphene] Rash  . Hydralazine Anxiety    Facial flushing, pt prefers not to use it.    Patient Measurements: Height: 5\' 2"  (157.5 cm) Weight: 127 lb 10.3 oz (57.9 kg) IBW/kg (Calculated) : 50.1 Height: 5'2" Weight: 58 kg  Vital Signs: Temp: 97.8 F (36.6 C) (02/23 0800) Temp Source: Oral (02/23 0800) BP: 130/53 mmHg (02/23 0700) Pulse Rate: 53 (02/23 0700)  Labs:  Recent Labs  08/24/14 1631 08/24/14 2141 08/25/14 0235  HGB 13.2  --  12.7  HCT 37.5  --  37.7  PLT 135*  --  150  LABPROT 25.4*  --  25.3*  INR 2.29*  --  2.28*  CREATININE 0.76  --  0.87  TROPONINI 0.03 0.03 0.03   Estimated Creatinine Clearance: 44.9 mL/min (by C-G formula based on Cr of 0.87).  Medical History: Past Medical History  Diagnosis Date  . CHF (congestive heart failure)   . Hypertension   . Anemia   . Broken back 2013  . Atrial fibrillation   . GERD (gastroesophageal reflux disease)   . Arthritis    Assessment: 89 y/oF with PMH of CHF, essential HTN, anemia, PAF, GERD, and arthritis who presents with complaints of elevated blood pressure with symptoms of dizziness, lightheadedness, occipital headache, blurred vision, and generalized tingling and numbness. CT of head with old infarcts, no acute intracranial abnormality. Pharmacy consulted to assist with dosing of Warfarin during inpatient stay.  Home dose Warfarin 5mg  daily at bedtime, last dose 2/21  Baseline INR 2.29, continue home dose Warfarin with 5mg  tonight 2/22  Today, 08/25/2014: INR 2.28, BP, symptoms improved. H/H stable, Platelets improved Toradol added prn, monitor CBC and renal function  Goal of Therapy:  INR 2-3 Monitor platelets by  anticoagulation protocol: Yes   Plan:   Warfarin 5mg  today at 1800  Daily PT/INR.  CBC at least q72h while inpatient.  Monitor for signs of bleeding.  Minda Ditto PharmD Pager [phone] 08/25/2014, 11:14 AM

## 2014-08-25 NOTE — Evaluation (Signed)
Occupational Therapy Evaluation Patient Details Name: [name] MRN: [id number] DOB: 1940-10-05 Today's Date: 08/25/2014    History of Present Illness Pt was admitted for hypertensive urgency. She c/o dizzinesss and lightheadedness.  Pt has a h/o P A-Fib and chronic back pain   Clinical Impression   This 74 year old female was admitted for the above.  She will benefit from skilled OT to increase safety and independence with adls. Goals in acute are for mod I level.      Follow Up Recommendations  Home health OT (vs none, depending upon progress)    Equipment Recommendations  None recommended by OT (likely)    Recommendations for Other Services       Precautions / Restrictions Precautions Precautions: Fall Restrictions Weight Bearing Restrictions: No      Mobility Bed Mobility Overal bed mobility: Needs Assistance Bed Mobility: Rolling;Sidelying to Sit Rolling: Supervision Sidelying to sit: Supervision       General bed mobility comments: used bedrail  Transfers Overall transfer level: Needs assistance Equipment used: None Transfers: Sit to/from Stand Sit to Stand: Min guard         General transfer comment: for safety    Balance                                            ADL Overall ADL's : Needs assistance/impaired                         Toilet Transfer: Min guard;Stand-pivot;BSC   Toileting- Clothing Manipulation and Hygiene: Set up;Sitting/lateral lean         General ADL Comments: pt is able to complete ADLs with set up and min guard for sit to stand.  When ambulating, had a couple of wobbles, which she self-corrected.  Min guard for safety.  Pt reports her vision is still blurry     Vision     Perception     Praxis      Pertinent Vitals/Pain Pain Assessment: Faces Faces Pain Scale: Hurts even more Pain Location: all over:  neck/hands Pain Descriptors / Indicators: Aching Pain Intervention(s):  Repositioned;Limited activity within patient's tolerance;Monitored during session;Premedicated before session     Hand Dominance     Extremity/Trunk Assessment Upper Extremity Assessment Upper Extremity Assessment: Generalized weakness (pain throughout; limited ROM due to pain--able to reach head)           Communication Communication Communication: No difficulties   Cognition Arousal/Alertness: Awake/alert Behavior During Therapy: WFL for tasks assessed/performed Overall Cognitive Status: Within Functional Limits for tasks assessed                     General Comments       Exercises       Shoulder Instructions      Home Living Family/patient expects to be discharged to:: Private residence Living Arrangements: Children Available Help at Discharge: Family (works during the day)               Civil engineer, contracting Shower/Tub: Occupational psychologist: Standard     Home Equipment: Environmental consultant - 2 wheels;Cane - single point;Shower seat - built in          Prior Functioning/Environment Level of Independence: Independent;Independent with assistive device(s)        Comments: used AD as needed  OT Diagnosis: Acute pain;Generalized weakness   OT Problem List: Decreased strength;Decreased activity tolerance;Impaired vision/perception;Impaired balance (sitting and/or standing);Pain   OT Treatment/Interventions: Self-care/ADL training;DME and/or AE instruction;Patient/family education;Balance training;Therapeutic activities    OT Goals(Current goals can be found in the care plan section) Acute Rehab OT Goals Patient Stated Goal: home.  Pt loves to sew and make things OT Goal Formulation: With patient Time For Goal Achievement: 09/01/14 Potential to Achieve Goals: Good ADL Goals Pt Will Transfer to Toilet: with modified independence;ambulating;regular height toilet Pt Will Perform Tub/Shower Transfer: Shower transfer;with modified independence;shower  seat Additional ADL Goal #1: pt will gather clothes at mod I level and complete ADL without supervision  OT Frequency: Min 2X/week   Barriers to D/C:            Co-evaluation PT/OT/SLP Co-Evaluation/Treatment: Yes Reason for Co-Treatment: For patient/therapist safety PT goals addressed during session: Mobility/safety with mobility OT goals addressed during session: ADL's and self-care      End of Session    Activity Tolerance: Patient tolerated treatment well Patient left: in chair;with call bell/phone within reach   Time: 1134-1153 OT Time Calculation (min): 19 min Charges:  OT General Charges $OT Visit: 1 Procedure OT Evaluation $Initial OT Evaluation Tier I: 1 Procedure G-Codes: OT G-codes **NOT FOR INPATIENT CLASS** Functional Assessment Tool Used: clinical observation/judgment Functional Limitation: Self care Self Care Current Status (V7846): At least 1 percent but less than 20 percent impaired, limited or restricted Self Care Goal Status (N6295): 0 percent impaired, limited or restricted  Arion Shankles 08/25/2014, 12:12 PM   Lesle Chris, OTR/L [phone] 08/25/2014

## 2014-08-25 NOTE — Progress Notes (Signed)
Pt transferred to [place] telemetry. Pt V/S stable and is not in any acute distress. All pt belongings transferred with patient including Ipad, phone, charger, clothing etc.

## 2014-08-25 NOTE — Telephone Encounter (Signed)
Patient is in the hospital and they want [name] to take a look at the INR results that was taken today.

## 2014-08-25 NOTE — Telephone Encounter (Signed)
emmi mailed  °

## 2014-08-25 NOTE — Evaluation (Signed)
Physical Therapy Evaluation Patient Details Name: [name] MRN: [id number] DOB: 1940/12/30 Today's Date: 08/25/2014   History of Present Illness  Pt was admitted for hypertensive urgency. She c/o dizzinesss and lightheadedness.  Pt has a h/o P A-Fib and chronic back pain  Clinical Impression  On eval, pt required Min guard assist for mobility-able to ambulate ~160 feet. Unsteady at time. Pt appeared to tolerate activity well. Not yet back to baseline per pt. Recommend HHPT to maximize independence and safety with mobility.     Follow Up Recommendations Home health PT    Equipment Recommendations  None recommended by PT    Recommendations for Other Services OT consult     Precautions / Restrictions Precautions Precautions: Fall Restrictions Weight Bearing Restrictions: No      Mobility  Bed Mobility Overal bed mobility: Needs Assistance Bed Mobility: Supine to Sit;Sidelying to Sit Rolling: Supervision Sidelying to sit: Supervision       General bed mobility comments: used bedrail  Transfers Overall transfer level: Needs assistance Equipment used: None Transfers: Sit to/from Stand Sit to Stand: Min guard         General transfer comment: for safety  Ambulation/Gait Ambulation/Gait assistance: Min guard Ambulation Distance (Feet): 160 Feet Assistive device: None Gait Pattern/deviations: Step-through pattern     General Gait Details: close guard for safety. Pt tolerated activity well. Unsteady at times but no overt LOB  Stairs            Wheelchair Mobility    Modified Rankin (Stroke Patients Only)       Balance Overall balance assessment: Needs assistance         Standing balance support: During functional activity;No upper extremity supported Standing balance-Leahy Scale: Good                               Pertinent Vitals/Pain Pain Assessment: Faces Faces Pain Scale: Hurts even more Pain Location: all over;  neck/hands Pain Descriptors / Indicators: Aching Pain Intervention(s): Monitored during session;Repositioned;Premedicated before session    Home Living Family/patient expects to be discharged to:: Private residence Living Arrangements: Children Available Help at Discharge: Family (works during the day)           Home Equipment: Environmental consultant - 2 wheels;Cane - single point;Shower seat - built in      Prior Function Level of Independence: Independent;Independent with assistive device(s)         Comments: used AD as needed     Hand Dominance        Extremity/Trunk Assessment   Upper Extremity Assessment: Defer to OT evaluation           Lower Extremity Assessment: Generalized weakness      Cervical / Trunk Assessment: Normal  Communication   Communication: No difficulties  Cognition Arousal/Alertness: Awake/alert Behavior During Therapy: WFL for tasks assessed/performed Overall Cognitive Status: Within Functional Limits for tasks assessed                      General Comments      Exercises        Assessment/Plan    PT Assessment Patient needs continued PT services  PT Diagnosis Difficulty walking;Generalized weakness;Acute pain   PT Problem List Decreased strength;Decreased activity tolerance;Decreased balance;Decreased mobility;Pain  PT Treatment Interventions DME instruction;Gait training;Functional mobility training;Therapeutic activities;Therapeutic exercise;Balance training;Patient/family education   PT Goals (Current goals can be found in the Care Plan section)  Acute Rehab PT Goals Patient Stated Goal: home.  Pt loves to sew and make things PT Goal Formulation: With patient Time For Goal Achievement: 09/08/14 Potential to Achieve Goals: Good    Frequency Min 3X/week   Barriers to discharge        Co-evaluation   Reason for Co-Treatment: For patient/therapist safety PT goals addressed during session: Mobility/safety with mobility OT  goals addressed during session: ADL's and self-care       End of Session Equipment Utilized During Treatment: Gait belt Activity Tolerance: Patient tolerated treatment well Patient left: in chair;with call bell/phone within reach           Time: 1134-1153 PT Time Calculation (min) (ACUTE ONLY): 19 min   Charges:   PT Evaluation $Initial PT Evaluation Tier I: 1 Procedure     PT G Codes:        Weston Anna, MPT Pager: [phone]

## 2014-08-25 NOTE — Telephone Encounter (Signed)
INR is 2.2 which is where we want it. Therefore, please continue coumadin at current dosage.

## 2014-08-25 NOTE — Progress Notes (Signed)
Patient ID: [name], female   DOB: [birth year], 74 y.o.   MRN: [id number]  [facility] PROGRESS NOTE  [name] JTT:[id number] DOB: 06-19-1941 DOA: 2014-09-05 PCP: Mauricio Po, [name]   Brief narrative:    74 y.o. female with HTN, paroxysmal a-fib on Coumadin at home, GERD, migraine headaches, presented to [facility] ED with main concern of several days duration of dizziness and light headedness, elevated BP at home up to 160's/90's. This is occasionally associated with neck pain and even some anterior chest discomfort, blurry vision. Episodes can last minutes to several hours and typically worse with standing or sitting up or with ambulation. She reports compliance with medications, no recent changes in medical regimen. Pt lives at home and reports independency with ADL.  In ED, pt noted to have SBP as high as 220's and as low as 90's, HR 40 - 70's, NSR. [facility] asked to admit for further evaluation.  Assessment/Plan:    Principal Problem:   Hypertensive urgency - BP much better this AM but SBP still in 160's - currently on Norvasc 10 mg QD, Clonidine 0.1 mg QD, Lasix 40 mg QD, Lisinopril 20 mg QD, Metoprolol 50 mg BID  - keep on same regimen for now and readjust as clinically indicated - due to mild bradycardia, will  lower the dose of Metoprolol to 25 mg BID PO - stable for transfer to telemetry bed  Active Problems:   Chest pain - resolved this AM, suspect this was related to HTN-ive urgency  - CE x 3 negative  - 12 lead EKG with some premature atrial beats but otherwise NSR    Occipital headaches - possible migraine headache, per neurologist possibly cervicogenic etiology - MRI cervical spine recommended - also Gabapentin started    Aortic stenosis - stable for now    Paroxysmal atrial fibrillation - rate currently controlled - continue Coumadin per pharmacy  - continue Metoprolol but lower the dose as noted above    HLD - continue statin   DVT prophylaxis - pt is  on therapeutic Coumadin and pharmacy is assisting with dosing   Code Status: Full.  Family Communication:  plan of care discussed with the patient Disposition Plan: Transfer to telemetry bed, PT evaluation requested   IV access:  Peripheral IV  Procedures and diagnostic studies:    Ct Head Wo Contrast  09/05/14   No acute intracranial abnormality. 2. Remote right cerebellar and basal ganglia infarcts.     Mr Brain Wo Contrast  2014-09-05 No acute intracranial process, specifically no acute ischemia.  Remote RIGHT superior cerebellar artery territory infarct. Remote RIGHT basal ganglia lacunar infarct.  Mild white matter changes suggest chronic small vessel ischemic disease, advanced from prior MRI though, less than expected for age.    Medical Consultants:  Neurology   Other Consultants:  PT   IAnti-Infectives:   None   Faye Ramsay, MD  [facility] Pager [phone]  If 7PM-7AM, please contact night-coverage [email or web address] Password [facility] 08/25/2014, 3:29 PM   LOS: 1 day   HPI/Subjective: No events overnight.   Objective: Filed Vitals:   08/25/14 1300 08/25/14 1400 08/25/14 1432 08/25/14 1500  BP: 112/60 96/28 116/36 110/35  Pulse: 51 47  47  Temp:      TempSrc:      Resp: 14 11  12   Height:      Weight:      SpO2: 96% 96%  91%    Intake/Output Summary (Last 24 hours) at 08/25/14 1529 Last  data filed at 08/25/14 0600  Gross per 24 hour  Intake      0 ml  Output   1400 ml  Net  -1400 ml    Exam:   General:  Pt is alert, follows commands appropriately, not in acute distress  Cardiovascular: Regular rhythm, bradycardic, SEM 3/6, no rubs, no gallops  Respiratory: Clear to auscultation bilaterally, no wheezing, no crackles, no rhonchi  Abdomen: Soft, non tender, non distended, bowel sounds present, no guarding  Extremities: No edema, pulses DP and PT palpable bilaterally  Neuro: Grossly nonfocal  Data Reviewed: Basic Metabolic Panel:  Recent Labs Lab  08/24/14 1631 08/25/14 0235  NA 139 138  K 4.3 3.7  CL 109 105  CO2 25 27  GLUCOSE 100* 159*  BUN 14 14  CREATININE 0.76 0.87  CALCIUM 8.8 8.8   Liver Function Tests:  Recent Labs Lab 08/25/14 0235  AST 19  ALT 14  ALKPHOS 77  BILITOT 0.6  PROT 6.2  ALBUMIN 3.6   CBC:  Recent Labs Lab 08/24/14 1631 08/25/14 0235  WBC 6.4 4.9  NEUTROABS 4.2 2.6  HGB 13.2 12.7  HCT 37.5 37.7  MCV 84.7 85.9  PLT 135* 150   Cardiac Enzymes:  Recent Labs Lab 08/24/14 1631 08/24/14 2141 08/25/14 0235  TROPONINI 0.03 0.03 0.03   Recent Results (from the past 240 hour(s))  MRSA PCR Screening     Status: None   Collection Time: 08/24/14 10:00 PM  Result Value Ref Range Status   MRSA by PCR NEGATIVE NEGATIVE Final    Comment:        The GeneXpert MRSA Assay (FDA approved for NASAL specimens only), is one component of a comprehensive MRSA colonization surveillance program. It is not intended to diagnose MRSA infection nor to guide or monitor treatment for MRSA infections.      Scheduled Meds: . amLODipine  10 mg Oral Daily  . [START ON 08/26/2014] cloNIDine  0.1 mg Oral Daily  . furosemide  40 mg Oral Daily  . lisinopril  20 mg Oral Daily  . metoprolol  50 mg Oral BID  . pantoprazole  40 mg Oral Daily  . simvastatin  20 mg Oral Daily  . sodium chloride  3 mL Intravenous Q12H  . warfarin  5 mg Oral ONCE-1800  . Warfarin - Pharmacist Dosing Inpatient   Does not apply q1800   Continuous Infusions:

## 2014-08-25 NOTE — Progress Notes (Signed)
   08/25/14 1320  PT Time Calculation  PT Start Time (ACUTE ONLY) 1134  PT Stop Time (ACUTE ONLY) 1153  PT Time Calculation (min) (ACUTE ONLY) 19 min  PT G-Codes **NOT FOR INPATIENT CLASS**  Functional Assessment Tool Used (clinical judgement)  Functional Limitation Mobility: Walking and moving around  Mobility: Walking and Moving Around Current Status (N0037) CI  Mobility: Walking and Moving Around Goal Status (C4888) CI  PT General Charges  $$ ACUTE PT VISIT 1 Procedure  PT Evaluation  $Initial PT Evaluation Tier I 1 Procedure   Weston Anna, MPT [phone]

## 2014-08-25 NOTE — Consult Note (Signed)
Consult Reason for Consult:dizziness and vision changes Referring Physician: Dr Posey Pronto  CC: dizziness  HPI: [name] is an 74 y.o. female with history of A fib, HTN, CHF presenting to ED for evaluation of dizziness, headache and blurred vision. In ED noted to have SBP >200. She notes taking some extra clonidine during the day but this did not improve her BP. She had a MRI brain overnight which showed old infarcts but no acute process.  Patient notes dizziness has improved but has multiple other concerns at this time. Notes an occipital/cervical headache, currently 7/10. Notes having similar headaches starting in 2013. Notes some cramping in her legs. She has had cervical disc surgery in 2014 and lumbar disc surgery in 1991 and 2012.   Past Medical History  Diagnosis Date  . CHF (congestive heart failure)   . Hypertension   . Anemia   . Broken back 2013  . Atrial fibrillation   . GERD (gastroesophageal reflux disease)   . Arthritis     Past Surgical History  Procedure Laterality Date  . Lumbar disc surgery  1991  . Lumbar back surgery  2012  . Cervical disc surgery  2014  . Cholecystectomy  1980s  . Appendectomy  1950s  . Tonsillectomy and adenoidectomy  1960s  . Carpal tunnel release Bilateral 1990s    Family History  Problem Relation Age of Onset  . Stroke Mother   . Heart disease Mother   . Emphysema Father   . Ovarian cancer Sister   . Stroke Sister   . Other Child     died at birth    Social History:  reports that she has never smoked. She has never used smokeless tobacco. She reports that she does not drink alcohol or use illicit drugs.  Allergies  Allergen Reactions  . Iodinated Diagnostic Agents Anaphylaxis    Info given by patient  . Milk-Related Compounds     Lactose intolerance  . Darvon [Propoxyphene] Rash  . Hydralazine Anxiety    Facial flushing, pt prefers not to use it.     Medications:  Scheduled: . amLODipine  10 mg Oral Daily  . [START  ON 08/26/2014] cloNIDine  0.1 mg Oral Daily  . furosemide  40 mg Oral Daily  . lisinopril  20 mg Oral Daily  . metoprolol  50 mg Oral BID  . pantoprazole  40 mg Oral Daily  . simvastatin  20 mg Oral Daily  . sodium chloride  3 mL Intravenous Q12H  . Warfarin - Pharmacist Dosing Inpatient   Does not apply q1800    ROS: Out of a complete 14 system review, the patient complains of only the following symptoms, and all other reviewed systems are negative. +headache, muscle cramps  Physical Examination: Filed Vitals:   08/25/14 0800  BP:   Pulse:   Temp: 97.8 F (36.6 C)  Resp:    Physical Exam  Constitutional: He appears well-developed and well-nourished.  Psych: Affect appropriate to situation Eyes: No scleral injection HENT: No OP obstrucion Head: Normocephalic.  Cardiovascular: irregular Respiratory: Effort normal and breath sounds normal.  GI: Soft. Bowel sounds are normal. No distension. There is no tenderness.  Skin: WDI  Neurologic Examination Mental Status: Alert, oriented, thought content appropriate.  Speech fluent without evidence of aphasia.  Able to follow 3 step commands without difficulty. Cranial Nerves: II: funduscopic exam wnl bilaterally, visual fields grossly normal, pupils equal, round, reactive to light and accommodation III,IV, VI: ptosis not present, extra-ocular motions  intact bilaterally V,VII: smile symmetric, facial light touch sensation normal bilaterally VIII: hearing normal bilaterally IX,X: gag reflex present XI: trapezius strength/neck flexion strength normal bilaterally XII: tongue strength normal  Motor: Right : Upper extremity    Left:     Upper extremity 5/5 deltoid       5/5 deltoid 5/5 biceps      5/5 biceps  5/5 triceps      5/5 triceps 5/5 hand grip      5/5 hand grip  Lower extremity     Lower extremity 5/5 hip flexor      5/5 hip flexor 5/5 quadricep      5/5 quadriceps  5/5 hamstrings     5/5 hamstrings 5/5 plantar  flexion       5/5 plantar flexion 5/5 plantar extension     5/5 plantar extension Tone and bulk:normal tone throughout; no atrophy noted Sensory: Pinprick and light touch intact throughout, bilaterally Deep Tendon Reflexes: 2+ and symmetric throughout Plantars: Right: downgoing   Left: downgoing Cerebellar: normal finger-to-nose, and normal heel-to-shin test Gait: deferred  Laboratory Studies:   Basic Metabolic Panel:  Recent Labs Lab 08/24/14 1631 08/25/14 0235  NA 139 138  K 4.3 3.7  CL 109 105  CO2 25 27  GLUCOSE 100* 159*  BUN 14 14  CREATININE 0.76 0.87  CALCIUM 8.8 8.8    Liver Function Tests:  Recent Labs Lab 08/25/14 0235  AST 19  ALT 14  ALKPHOS 77  BILITOT 0.6  PROT 6.2  ALBUMIN 3.6   No results for input(s): LIPASE, AMYLASE in the last 168 hours. No results for input(s): AMMONIA in the last 168 hours.  CBC:  Recent Labs Lab 08/24/14 1631 08/25/14 0235  WBC 6.4 4.9  NEUTROABS 4.2 2.6  HGB 13.2 12.7  HCT 37.5 37.7  MCV 84.7 85.9  PLT 135* 150    Cardiac Enzymes:  Recent Labs Lab 08/24/14 1631 08/24/14 2141 08/25/14 0235  TROPONINI 0.03 0.03 0.03    BNP: Invalid input(s): POCBNP  CBG: No results for input(s): GLUCAP in the last 168 hours.  Microbiology: Results for orders placed or performed during the hospital encounter of 08/24/14  MRSA PCR Screening     Status: None   Collection Time: 08/24/14 10:00 PM  Result Value Ref Range Status   MRSA by PCR NEGATIVE NEGATIVE Final    Comment:        The GeneXpert MRSA Assay (FDA approved for NASAL specimens only), is one component of a comprehensive MRSA colonization surveillance program. It is not intended to diagnose MRSA infection nor to guide or monitor treatment for MRSA infections.     Coagulation Studies:  Recent Labs  08/24/14 1631 08/25/14 0235  LABPROT 25.4* 25.3*  INR 2.29* 2.28*    Urinalysis: No results for input(s): COLORURINE, LABSPEC, PHURINE,  GLUCOSEU, HGBUR, BILIRUBINUR, KETONESUR, PROTEINUR, UROBILINOGEN, NITRITE, LEUKOCYTESUR in the last 168 hours.  Invalid input(s): APPERANCEUR  Lipid Panel:     Component Value Date/Time   CHOL  05/14/2010 0254    145        ATP III CLASSIFICATION:  <200     mg/dL   Desirable  200-239  mg/dL   Borderline High  >=240    mg/dL   High          TRIG 75 05/14/2010 0254   HDL 45 05/14/2010 0254   CHOLHDL 3.2 05/14/2010 0254   VLDL 15 05/14/2010 0254   LDLCALC  05/14/2010 0254  85        Total Cholesterol/HDL:CHD Risk Coronary Heart Disease Risk Table                     Men   Women  1/2 Average Risk   3.4   3.3  Average Risk       5.0   4.4  2 X Average Risk   9.6   7.1  3 X Average Risk  23.4   11.0        Use the calculated Patient Ratio above and the CHD Risk Table to determine the patient's CHD Risk.        ATP III CLASSIFICATION (LDL):  <100     mg/dL   Optimal  100-129  mg/dL   Near or Above                    Optimal  130-159  mg/dL   Borderline  160-189  mg/dL   High  >190     mg/dL   Very High    HgbA1C: No results found for: HGBA1C  Urine Drug Screen:  No results found for: LABOPIA, COCAINSCRNUR, LABBENZ, AMPHETMU, THCU, LABBARB  Alcohol Level: No results for input(s): ETH in the last 168 hours.  Other results:  Imaging: Ct Head Wo Contrast  08/24/2014   CLINICAL DATA:  Hypertension. Blurred vision. Dizziness. Head and neck pain.  EXAM: CT HEAD WITHOUT CONTRAST  TECHNIQUE: Contiguous axial images were obtained from the base of the skull through the vertex without intravenous contrast.  COMPARISON:  10/02/2006  FINDINGS: Sinuses/Soft tissues: Clear paranasal sinuses and mastoid air cells.  Intracranial: Vertebral basilar atherosclerosis. Remote right cerebellar hemisphere and basal ganglia infarcts. No mass lesion, hemorrhage, hydrocephalus, acute infarct, intra-axial, or extra-axial fluid collection.  IMPRESSION: 1.  No acute intracranial abnormality. 2. Remote  right cerebellar and basal ganglia infarcts.   Electronically Signed   By: Abigail Miyamoto M.D.   On: 08/24/2014 17:34   Mr Brain Wo Contrast  08/24/2014   CLINICAL DATA:  Dizziness and blurry vision. History of hypertension, atrial fibrillation. Acute symptoms, initial evaluation.  EXAM: MRI HEAD WITHOUT CONTRAST  TECHNIQUE: Multiplanar, multiecho pulse sequences of the brain and surrounding structures were obtained without intravenous contrast.  COMPARISON:  CT of the head August 24, 2014 at 1727 hours and MRI of the brain October 17, 2003  FINDINGS: No reduced diffusion to suggest acute ischemia. No susceptibility artifact to suggest hemorrhage.  RIGHT superior cerebellar encephalomalacia, present on prior imaging. Tiny remote LEFT cerebellar infarcts. Prominent RIGHT basal ganglia perivascular spaces, superimposed trace FLAIR signal which is new. A few scattered supratentorial white matter FLAIR T2 hyperintensities, progressed from prior imaging though, less than expected for age patient's age. No midline shift, mass effect or mass lesions.  No abnormal extra-axial fluid collections. Normal major intracranial vascular flow voids seen at the skull base.  Ocular globes and orbital contents are unremarkable. Paranasal sinuses and mastoid air cells are well aerated. No abnormal sellar expansion. Partially imaged apparent ACDF. No suspicious calvarial bone marrow signal. No cerebellar tonsillar ectopia.  IMPRESSION: No acute intracranial process, specifically no acute ischemia.  Remote RIGHT superior cerebellar artery territory infarct. Remote RIGHT basal ganglia lacunar infarct.  Mild white matter changes suggest chronic small vessel ischemic disease, advanced from prior MRI though, less than expected for age.   Electronically Signed   By: Elon Alas   On: 08/24/2014 21:21     Assessment/Plan:  73y/o woman hx of A fib, HTN, cervical and lumbar disc disease presenting with acute onset of dizziness and  blurred vision in the setting of markedly elevated blood pressure. Dizziness has improved with resolution of hypertension. MRI brain shows old infarcts but no acute process. Patients main concern is headache which based on description appears to be cervicogenic in nature. It has been ongoing since 2013.   -check MRI C spine (this can be completed as outpatient if patient otherwise medically cleared for discharge) -PT evaluation -start gabapentin 100mg  TID -she will need outpatient neurology follow up  Jim Like, DO [facility] [phone]  If 7pm- 7am, please page neurology on call as listed in [place]. 08/25/2014, 10:06 AM

## 2014-08-26 ENCOUNTER — Observation Stay (HOSPITAL_COMMUNITY): Payer: Medicare Other

## 2014-08-26 DIAGNOSIS — M545 Low back pain: Secondary | ICD-10-CM | POA: Diagnosis not present

## 2014-08-26 DIAGNOSIS — I509 Heart failure, unspecified: Secondary | ICD-10-CM | POA: Diagnosis not present

## 2014-08-26 DIAGNOSIS — H8110 Benign paroxysmal vertigo, unspecified ear: Secondary | ICD-10-CM

## 2014-08-26 DIAGNOSIS — I350 Nonrheumatic aortic (valve) stenosis: Secondary | ICD-10-CM | POA: Diagnosis not present

## 2014-08-26 DIAGNOSIS — R918 Other nonspecific abnormal finding of lung field: Secondary | ICD-10-CM | POA: Diagnosis not present

## 2014-08-26 DIAGNOSIS — R509 Fever, unspecified: Secondary | ICD-10-CM | POA: Diagnosis not present

## 2014-08-26 DIAGNOSIS — E860 Dehydration: Secondary | ICD-10-CM | POA: Diagnosis not present

## 2014-08-26 DIAGNOSIS — I480 Paroxysmal atrial fibrillation: Secondary | ICD-10-CM | POA: Diagnosis not present

## 2014-08-26 DIAGNOSIS — I10 Essential (primary) hypertension: Secondary | ICD-10-CM | POA: Diagnosis not present

## 2014-08-26 DIAGNOSIS — J181 Lobar pneumonia, unspecified organism: Secondary | ICD-10-CM | POA: Diagnosis not present

## 2014-08-26 LAB — URINE MICROSCOPIC-ADD ON

## 2014-08-26 LAB — PROTIME-INR
INR: 2.47 — ABNORMAL HIGH (ref 0.00–1.49)
Prothrombin Time: 26.9 seconds — ABNORMAL HIGH (ref 11.6–15.2)

## 2014-08-26 LAB — BASIC METABOLIC PANEL
ANION GAP: 9 (ref 5–15)
BUN: 24 mg/dL — ABNORMAL HIGH (ref 6–23)
CO2: 30 mmol/L (ref 19–32)
Calcium: 8.6 mg/dL (ref 8.4–10.5)
Chloride: 101 mmol/L (ref 96–112)
Creatinine, Ser: 1.26 mg/dL — ABNORMAL HIGH (ref 0.50–1.10)
GFR calc Af Amer: 47 mL/min — ABNORMAL LOW (ref >90)
GFR, EST NON AFRICAN AMERICAN: 41 mL/min — AB (ref >90)
Glucose, Bld: 119 mg/dL — ABNORMAL HIGH (ref 70–99)
Potassium: 4.1 mmol/L (ref 3.5–5.1)
SODIUM: 140 mmol/L (ref 135–145)

## 2014-08-26 LAB — URINALYSIS, ROUTINE W REFLEX MICROSCOPIC
BILIRUBIN URINE: NEGATIVE
Glucose, UA: NEGATIVE mg/dL
Ketones, ur: NEGATIVE mg/dL
Nitrite: NEGATIVE
Protein, ur: NEGATIVE mg/dL
Specific Gravity, Urine: 1.011 (ref 1.005–1.030)
Urobilinogen, UA: 0.2 mg/dL (ref 0.0–1.0)
pH: 5 (ref 5.0–8.0)

## 2014-08-26 LAB — CBC
HCT: 40.1 % (ref 36.0–46.0)
HEMOGLOBIN: 13.6 g/dL (ref 12.0–15.0)
MCH: 29.4 pg (ref 26.0–34.0)
MCHC: 33.9 g/dL (ref 30.0–36.0)
MCV: 86.6 fL (ref 78.0–100.0)
Platelets: 173 10*3/uL (ref 150–400)
RBC: 4.63 MIL/uL (ref 3.87–5.11)
RDW: 12.6 % (ref 11.5–15.5)
WBC: 7 10*3/uL (ref 4.0–10.5)

## 2014-08-26 LAB — C-REACTIVE PROTEIN: CRP: <0.5 mg/dL — ABNORMAL LOW (ref <0.60)

## 2014-08-26 LAB — LACTIC ACID, PLASMA: Lactic Acid, Venous: 1.6 mmol/L (ref 0.5–2.0)

## 2014-08-26 LAB — SEDIMENTATION RATE: Sed Rate: 15 mm/hr (ref 0–22)

## 2014-08-26 MED ORDER — MECLIZINE HCL 25 MG PO TABS
25.0000 mg | ORAL_TABLET | Freq: Three times a day (TID) | ORAL | Status: DC
  Administered 2014-08-26 – 2014-08-28 (×6): 25 mg via ORAL
  Filled 2014-08-26 (×7): qty 1

## 2014-08-26 MED ORDER — METOPROLOL TARTRATE 1 MG/ML IV SOLN
2.5000 mg | Freq: Once | INTRAVENOUS | Status: AC
  Administered 2014-08-26: 2.5 mg via INTRAVENOUS
  Filled 2014-08-26: qty 5

## 2014-08-26 MED ORDER — SODIUM CHLORIDE 0.9 % IV BOLUS (SEPSIS)
500.0000 mL | Freq: Once | INTRAVENOUS | Status: AC
  Administered 2014-08-26: 500 mL via INTRAVENOUS

## 2014-08-26 MED ORDER — DILTIAZEM HCL 25 MG/5ML IV SOLN
5.0000 mg | Freq: Once | INTRAVENOUS | Status: AC
  Administered 2014-08-26: 5 mg via INTRAVENOUS
  Filled 2014-08-26: qty 5

## 2014-08-26 MED ORDER — WARFARIN SODIUM 5 MG PO TABS
5.0000 mg | ORAL_TABLET | Freq: Once | ORAL | Status: AC
  Administered 2014-08-26: 5 mg via ORAL
  Filled 2014-08-26: qty 1

## 2014-08-26 MED ORDER — VANCOMYCIN HCL 10 G IV SOLR
1250.0000 mg | Freq: Once | INTRAVENOUS | Status: AC
  Administered 2014-08-26: 1250 mg via INTRAVENOUS
  Filled 2014-08-26: qty 1250

## 2014-08-26 MED ORDER — PROMETHAZINE HCL 25 MG/ML IJ SOLN: 12.5000 mg | Freq: Once | INTRAMUSCULAR | Status: DC

## 2014-08-26 MED ORDER — SODIUM CHLORIDE 0.45 % IV SOLN
INTRAVENOUS | Status: AC
  Administered 2014-08-26 (×2): via INTRAVENOUS

## 2014-08-26 MED ORDER — PIPERACILLIN-TAZOBACTAM 3.375 G IVPB
3.3750 g | Freq: Three times a day (TID) | INTRAVENOUS | Status: DC
  Administered 2014-08-27 – 2014-08-28 (×5): 3.375 g via INTRAVENOUS
  Filled 2014-08-26 (×8): qty 50

## 2014-08-26 MED ORDER — VANCOMYCIN HCL IN DEXTROSE 750-5 MG/150ML-% IV SOLN
750.0000 mg | INTRAVENOUS | Status: DC
  Filled 2014-08-26: qty 150

## 2014-08-26 NOTE — Progress Notes (Signed)
Subjective: No acute changes. Reports continued diffuse pain, predominantly in shoulders and cervical region. 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 Also note s/p C3-4, C5-6, C6-7 ACDF.  Objective: Current vital signs: BP 138/44 mmHg  Pulse 53  Temp(Src) 98 F (36.7 C) (Oral)  Resp 20  Ht '5\' 2"'  (1.575 m)  Wt 56 kg (123 lb 7.3 oz)  BMI 22.57 kg/m2  SpO2 92% Vital signs in last 24 hours: Temp:  [97.7 F (36.5 C)-98 F (36.7 C)] 98 F (36.7 C) (02/24 0602) Pulse Rate:  [47-66] 53 (02/24 0602) Resp:  [11-20] 20 (02/24 0602) BP: (96-168)/(28-90) 138/44 mmHg (02/24 0602) SpO2:  [91 %-97 %] 92 % (02/24 0602) Weight:  [56 kg (123 lb 7.3 oz)-57.607 kg (127 lb)] 56 kg (123 lb 7.3 oz) (02/24 0609)  Intake/Output from previous day: 02/23 0701 - 02/24 0700 In: 760 [P.O.:760] Out: -  Intake/Output this shift: Total I/O In: 240 [P.O.:240] Out: -  Nutritional status: Diet regular  Neurologic Exam: Mental Status: Alert, oriented, thought content appropriate. Speech fluent without evidence of aphasia.  Cranial Nerves: II: PERRL III,IV, VI: extra-ocular motions intact bilaterally V,VII: smile symmetric, facial light touch sensation normal bilaterally Motor: 5/5 strength in all extremities Sensory:  light touch intact throughout, bilaterally  Lab Results: Basic Metabolic Panel:  Recent Labs Lab 08/24/14 1631 08/25/14 0235 08/26/14 0510  NA 139 138 140  K 4.3 3.7 4.1  CL 109 105 101  CO2 '25 27 30  ' GLUCOSE 100* 159* 119*  BUN 14 14 24*  CREATININE 0.76 0.87 1.26*  CALCIUM 8.8 8.8 8.6    Liver Function Tests:  Recent Labs Lab 08/25/14 0235  AST 19  ALT 14  ALKPHOS 77  BILITOT 0.6  PROT 6.2  ALBUMIN 3.6   No results for input(s): LIPASE, AMYLASE in the last 168 hours. No results for input(s): AMMONIA in the last 168 hours.  CBC:  Recent Labs Lab  08/24/14 1631 08/25/14 0235 08/26/14 0510  WBC 6.4 4.9 7.0  NEUTROABS 4.2 2.6  --   HGB 13.2 12.7 13.6  HCT 37.5 37.7 40.1  MCV 84.7 85.9 86.6  PLT 135* 150 173    Cardiac Enzymes:  Recent Labs Lab 08/24/14 1631 08/24/14 2141 08/25/14 0235  TROPONINI 0.03 0.03 0.03    Lipid Panel: No results for input(s): CHOL, TRIG, HDL, CHOLHDL, VLDL, LDLCALC in the last 168 hours.  CBG: No results for input(s): GLUCAP in the last 168 hours.  Microbiology: Results for orders placed or performed during the hospital encounter of 08/24/14  MRSA PCR Screening     Status: None   Collection Time: 08/24/14 10:00 PM  Result Value Ref Range Status   MRSA by PCR NEGATIVE NEGATIVE Final    Comment:        The GeneXpert MRSA Assay (FDA approved for NASAL specimens only), is one component of a comprehensive MRSA colonization surveillance program. It is not intended to diagnose MRSA infection nor to guide or monitor treatment for MRSA infections.     Coagulation Studies:  Recent Labs  08/24/14 1631 08/25/14 0235 08/26/14 0510  LABPROT 25.4* 25.3* 26.9*  INR 2.29* 2.28* 2.47*    Imaging: Ct Head Wo Contrast  08/24/2014   CLINICAL DATA:  Hypertension. Blurred vision. Dizziness. Head and neck pain.  EXAM: CT HEAD WITHOUT CONTRAST  TECHNIQUE: Contiguous axial images were obtained from the base of the skull  through the vertex without intravenous contrast.  COMPARISON:  10/02/2006  FINDINGS: Sinuses/Soft tissues: Clear paranasal sinuses and mastoid air cells.  Intracranial: Vertebral basilar atherosclerosis. Remote right cerebellar hemisphere and basal ganglia infarcts. No mass lesion, hemorrhage, hydrocephalus, acute infarct, intra-axial, or extra-axial fluid collection.  IMPRESSION: 1.  No acute intracranial abnormality. 2. Remote right cerebellar and basal ganglia infarcts.   Electronically Signed   By: Abigail Miyamoto M.D.   On: 08/24/2014 17:34   Mr Brain Wo Contrast  08/24/2014    CLINICAL DATA:  Dizziness and blurry vision. History of hypertension, atrial fibrillation. Acute symptoms, initial evaluation.  EXAM: MRI HEAD WITHOUT CONTRAST  TECHNIQUE: Multiplanar, multiecho pulse sequences of the brain and surrounding structures were obtained without intravenous contrast.  COMPARISON:  CT of the head August 24, 2014 at 1727 hours and MRI of the brain October 17, 2003  FINDINGS: No reduced diffusion to suggest acute ischemia. No susceptibility artifact to suggest hemorrhage.  RIGHT superior cerebellar encephalomalacia, present on prior imaging. Tiny remote LEFT cerebellar infarcts. Prominent RIGHT basal ganglia perivascular spaces, superimposed trace FLAIR signal which is new. A few scattered supratentorial white matter FLAIR T2 hyperintensities, progressed from prior imaging though, less than expected for age patient's age. No midline shift, mass effect or mass lesions.  No abnormal extra-axial fluid collections. Normal major intracranial vascular flow voids seen at the skull base.  Ocular globes and orbital contents are unremarkable. Paranasal sinuses and mastoid air cells are well aerated. No abnormal sellar expansion. Partially imaged apparent ACDF. No suspicious calvarial bone marrow signal. No cerebellar tonsillar ectopia.  IMPRESSION: No acute intracranial process, specifically no acute ischemia.  Remote RIGHT superior cerebellar artery territory infarct. Remote RIGHT basal ganglia lacunar infarct.  Mild white matter changes suggest chronic small vessel ischemic disease, advanced from prior MRI though, less than expected for age.   Electronically Signed   By: Elon Alas   On: 08/24/2014 21:21   Mr Cervical Spine W Wo Contrast  08/25/2014   CLINICAL DATA:  Dizziness, headache and chronic neck pain for greater than 3 months. History of cervical disc surgery 2014.  EXAM: MRI CERVICAL SPINE WITHOUT AND WITH CONTRAST  TECHNIQUE: Multiplanar and multiecho pulse sequences of the  cervical spine, to include the craniocervical junction and cervicothoracic junction, were obtained according to standard protocol without and with intravenous contrast.  CONTRAST:  20m MULTIHANCE GADOBENATE DIMEGLUMINE 529 MG/ML IV SOLN  COMPARISON:  CT of the cervical spine October 02, 2006  FINDINGS: Cervical vertebral bodies intact, maintenance of cervical lordosis. Grade 1 minimal C4-5 anterolisthesis, similar to prior examination, status post C3-4, C5-6 and C6-7 ACDF, hardware results in some susceptibility artifact at this level. Non surgically altered disc at C4-5 and at C2-3 demonstrate decreased T2 signal, normal morphology. No STIR signal abnormality to suggest acute osseous process. No suspicious osseous or intradiscal enhancement.  Focally dilated 3 mm central spinal canal at the level of C6-7, 11 mm in craniocaudad dimension. No myelomalacia. Craniocervical junction is intact. Included prevertebral and paraspinal soft tissues are nonsuspicious.  Level by level evaluation:  C2-3: Severe RIGHT, at least moderate LEFT facet arthropathy. No disc bulge. Uncovertebral hypertrophy directed to the RIGHT. Mild canal stenosis. Moderate to severe RIGHT neural foraminal narrowing.  C3-4 ACDF. No canal stenosis. No definite neural foraminal narrowing.  C4-5: Small broad-based disc bulge, uncovertebral hypertrophy and severe LEFT, moderate RIGHT facet arthropathy. Mild to moderate canal stenosis. At least moderate RIGHT and mild-to-moderate LEFT neural foraminal narrowing.  C5-6:  ACDF. Mild canal stenosis. At least moderate bilateral neural foraminal narrowing though hardware artifact limits evaluation.  C6-7: ACDF. Mild canal stenosis. Uncovertebral hypertrophy. Mild bilateral neural foraminal narrowing.  C7-T1:  No disc bulge, canal stenosis or neural foraminal narrowing.  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 No cervical spinal cord edema or abnormal cervical spinal cord enhancement.  Status post C3-4,  C5-6 and C6-7 ACDF, which results in some hardware artifact.  Degenerative change of the cervical spine resulting in mild to moderate canal stenosis at C4-5, mild canal stenosis at C5-6 and C6-7.  Neural foraminal narrowing C2-3, C4-5 through C6-7: Moderate to severe on the RIGHT at C2-3.   Electronically Signed   By: Elon Alas   On: 08/25/2014 22:51    Medications:  Scheduled: . amLODipine  10 mg Oral Daily  . cloNIDine  0.1 mg Oral Daily  . furosemide  40 mg Oral Daily  . gabapentin  100 mg Oral TID  . lisinopril  20 mg Oral Daily  . metoprolol  25 mg Oral BID  . pantoprazole  40 mg Oral Daily  . simvastatin  20 mg Oral Daily  . sodium chloride  3 mL Intravenous Q12H  . Warfarin - Pharmacist Dosing Inpatient   Does not apply q1800   Continuous:   Assessment/Plan:  73y/o woman hx of A fib, HTN, cervical and lumbar disc disease presenting with acute onset of dizziness and blurred vision in the setting of markedly elevated blood pressure. Dizziness has improved with resolution of hypertension. MRI brain shows old infarcts but no acute process. MRI C spine shows small hydromyelia and multi-level degenerative changes. Patients main concern is headache which based on description appears to be cervicogenic in nature. She does note some continued blurred vision. Atypical presentation but cannot rule out GCA. Headache and diffuse pain has been ongoing since 2013. MRI C spine shows small hydromyelia and multi-level degenerative changes.  -discussed MRI C spine findings of hydromyelia with neurosurgery, Dr Ronnald Ramp. No further workup or follow up of hydromyelia indicated.  -check ESR and CRP -PT evaluation -continue gabapentin 157m TID, can be slowly titrated up as outpatient -will need outpatient neurology follow up.     LOS: 2 days   PJim Like DO [facility] [phone] If 7pm- 7am, please page neurology on call as listed in [place] 08/26/2014  9:35 AM

## 2014-08-26 NOTE — Progress Notes (Signed)
UR completed 

## 2014-08-26 NOTE — Progress Notes (Addendum)
Patient ID: [name], female   DOB: 1940-08-16, 74 y.o.   MRN: [id number]  [facility] PROGRESS NOTE  STEPHENE ALEGRIA XAJ:[id number] DOB: 30-Jan-1941 DOA: 08/24/2014 PCP: Mauricio Po, [name]   Brief narrative:    74 y.o. female with HTN, paroxysmal a-fib on Coumadin at home, GERD, migraine headaches, presented to [facility] ED with main concern of several days duration of dizziness and light headedness, elevated BP at home up to 160's/90's. This is occasionally associated with neck pain and even some anterior chest discomfort, blurry vision. Episodes can last minutes to several hours and typically worse with standing or sitting up or with ambulation. She reports compliance with medications, no recent changes in medical regimen. Pt lives at home and reports independency with ADL.  In ED, pt noted to have SBP as high as 220's and as low as 90's, HR 40 - 70's, NSR. [facility] asked to admit for further evaluation.  Assessment/Plan:    Hypertensive urgency - BP much better controlled. - Continue current medications. Heart rate was noted to be low and beta blocker dose was reduced. Heart rate is improved. Continue to monitor.  Chest pain - No further occurrence of chest pain. This was likely related to elevated blood pressure. Troponins negative 3. EKG was nonischemic.   Occipital headaches - possible migraine headache, per neurologist possibly cervicogenic etiology - MRI cervical spine as being done. Neurology is waiting. No acute intervention needed currently. - Gabapentin started as well  Dizziness/vertigo Will give a trial of meclizine. PT and OT to see. Vestibular evaluation.  Elevated BUN and creatinine Could be mildly dehydrated. Gentle IV hydration. Hold her Lasix for tonight. Ejection fraction was normal in July 2015.  Mild Aortic stenosis - stable for now   Paroxysmal atrial fibrillation - rate currently controlled - continue Coumadin per pharmacy  - continue Metoprolol but  lower the dose as noted above   HLD - continue statin   ADDENDUM Called by RN as the patient went into rapid A. fib. She also spiked temperature up to 101F. No clear etiology for her fever. Chest x-ray and UA ordered. Chest x-ray raised the possibility of an opacity in the right lung. Patient seen at bedside. She is as she was this morning. Denies any chest pain. Some difficulty breathing at times, but appears to be quite comfortable. Son is at the bedside as well. Lungs were clear to auscultation for the most part. She was given Cardizem with partial improvement in heart rate. She'll be given a dose of metoprolol and another dose of Cardizem. She will also be given antipyretics to bring down her temperature. Check lactic acid. Blood cultures. Due to the findings on chest x-ray we will initiate vancomycin and Zosyn. Continue to monitor on the floor for now. If heart rate becomes difficult to control or if her blood pressure drops to transfer to step down. But for now, she remains stable.  DVT prophylaxis - pt is on therapeutic Coumadin and pharmacy is assisting with dosing  Code Status: Full.  Family Communication:  Discussed with patient Disposition Plan: Continue to mobilize. Vestibular evaluation. Possible discharge tomorrow.  IV access:  Peripheral IV  Medical Consultants:  Neurology   Other Consultants:  PT/OT  IAnti-Infectives:   None    Subjective: Patient continues to have dizziness. She describes it as the room spinning around her when she gets up or moves her head. No chest pain. She has neck pain and shoulder pain, which are chronic.  Objective: Filed Vitals:   08/25/14 1732 08/25/14 2119 08/26/14 0602 08/26/14 0609  BP: 168/41 116/40 138/44   Pulse: 52 57 53   Temp: 97.7 F (36.5 C) 97.9 F (36.6 C) 98 F (36.7 C)   TempSrc: Oral Oral Oral   Resp: 14 16 20    Height:      Weight:    56 kg (123 lb 7.3 oz)  SpO2: 97% 93% 92%     Intake/Output Summary (Last 24  hours) at 08/26/14 1202 Last data filed at 08/26/14 1610  Gross per 24 hour  Intake    440 ml  Output      0 ml  Net    440 ml    Exam:   General:  Pt is alert, follows commands appropriately, not in acute distress  Cardiovascular: Regular rhythm, bradycardic, systolic murmur 3/6, no rubs, no gallops  Respiratory: Clear to auscultation bilaterally, no wheezing, no crackles, no rhonchi  Abdomen: Soft, non tender, non distended, bowel sounds present, no guarding  Extremities: No edema, pulses DP and PT palpable bilaterally  Neuro: Alert and oriented 3. Lateral Nystagmus noted. Cranial nerves otherwise intact. Motor strength equal bilateral upper and lower extremities.  Data Reviewed: Basic Metabolic Panel:  Recent Labs Lab 08/24/14 1631 08/25/14 0235 08/26/14 0510  NA 139 138 140  K 4.3 3.7 4.1  CL 109 105 101  CO2 25 27 30   GLUCOSE 100* 159* 119*  BUN 14 14 24*  CREATININE 0.76 0.87 1.26*  CALCIUM 8.8 8.8 8.6   Liver Function Tests:  Recent Labs Lab 08/25/14 0235  AST 19  ALT 14  ALKPHOS 77  BILITOT 0.6  PROT 6.2  ALBUMIN 3.6   CBC:  Recent Labs Lab 08/24/14 1631 08/25/14 0235 08/26/14 0510  WBC 6.4 4.9 7.0  NEUTROABS 4.2 2.6  --   HGB 13.2 12.7 13.6  HCT 37.5 37.7 40.1  MCV 84.7 85.9 86.6  PLT 135* 150 173   Cardiac Enzymes:  Recent Labs Lab 08/24/14 1631 08/24/14 2141 08/25/14 0235  TROPONINI 0.03 0.03 0.03   Recent Results (from the past 240 hour(s))  MRSA PCR Screening     Status: None   Collection Time: 08/24/14 10:00 PM  Result Value Ref Range Status   MRSA by PCR NEGATIVE NEGATIVE Final    Comment:        The GeneXpert MRSA Assay (FDA approved for NASAL specimens only), is one component of a comprehensive MRSA colonization surveillance program. It is not intended to diagnose MRSA infection nor to guide or monitor treatment for MRSA infections.      Scheduled Meds: . amLODipine  10 mg Oral Daily  . cloNIDine  0.1 mg  Oral Daily  . gabapentin  100 mg Oral TID  . lisinopril  20 mg Oral Daily  . meclizine  25 mg Oral TID  . metoprolol  25 mg Oral BID  . pantoprazole  40 mg Oral Daily  . simvastatin  20 mg Oral Daily  . sodium chloride  3 mL Intravenous Q12H  . warfarin  5 mg Oral ONCE-1800  . Warfarin - Pharmacist Dosing Inpatient   Does not apply q1800   Continuous Infusions: . sodium chloride        Bonnielee Haff, MD  [place] Pager [phone]  If 7PM-7AM, please contact night-coverage [email or web address] Password [facility]  08/26/2014 2:49 PM   LOS: 2 days

## 2014-08-26 NOTE — Progress Notes (Signed)
ANTIBIOTIC CONSULT NOTE - INITIAL  Pharmacy Consult for Vancomycin, Zosyn Indication: Pneumonia  Allergies  Allergen Reactions  . Iodinated Diagnostic Agents Anaphylaxis    Info given by patient  . Milk-Related Compounds     Lactose intolerance  . Darvon [Propoxyphene] Rash  . Hydralazine Anxiety    Facial flushing, pt prefers not to use it.     Patient Measurements: Height: 5\' 2"  (157.5 cm) Weight: 123 lb 7.3 oz (56 kg) IBW/kg (Calculated) : 50.1  Vital Signs: Temp: 101.1 F (38.4 C) (02/24 1642) BP: 137/42 mmHg (02/24 1642) Pulse Rate: 106 (02/24 1642) Intake/Output from previous day: 02/23 0701 - 02/24 0700 In: 760 [P.O.:760] Out: -  Intake/Output from this shift: Total I/O In: 480 [P.O.:480] Out: 700 [Urine:700]  Labs:  Recent Labs  08/24/14 1631 08/25/14 0235 08/26/14 0510  WBC 6.4 4.9 7.0  HGB 13.2 12.7 13.6  PLT 135* 150 173  CREATININE 0.76 0.87 1.26*   Estimated Creatinine Clearance: 30.5 mL/min (by C-G formula based on Cr of 1.26). No results for input(s): VANCOTROUGH, VANCOPEAK, VANCORANDOM, GENTTROUGH, GENTPEAK, GENTRANDOM, TOBRATROUGH, TOBRAPEAK, TOBRARND, AMIKACINPEAK, AMIKACINTROU, AMIKACIN in the last 72 hours.   Microbiology: Recent Results (from the past 720 hour(s))  MRSA PCR Screening     Status: None   Collection Time: 08/24/14 10:00 PM  Result Value Ref Range Status   MRSA by PCR NEGATIVE NEGATIVE Final    Comment:        The GeneXpert MRSA Assay (FDA approved for NASAL specimens only), is one component of a comprehensive MRSA colonization surveillance program. It is not intended to diagnose MRSA infection nor to guide or monitor treatment for MRSA infections.     Medical History: Past Medical History  Diagnosis Date  . CHF (congestive heart failure)   . Hypertension   . Anemia   . Broken back 2013  . Atrial fibrillation   . GERD (gastroesophageal reflux disease)   . Arthritis     Assessment: 37 y/oF with PMH of  CHF, essential HTN, anemia, PAF, GERD, and arthritis who presented to [facility] ED 2/22 with complaints of elevated blood pressure with symptoms of dizziness, lightheadedness, occipital headache, blurred vision, and generalized tingling and numbness. CT of head with old infarcts, no acute intracranial abnormality. CXR today shows vague opacity in RUL, question PNA. Pharmacy consulted to dose Vancomycin and Zosyn for possible pneumonia.  2/24 >> Vancomycin >> 2/24 >> Zosyn >>    Tmax: 101.8F WBCs: WNL Renal: SCr increased to 1.26, CrCl ~ 31 mL/min CG  2/22 MRSA PCR: negative 2/24 blood x 2: sent  Goal of Therapy:  Vancomycin trough level 15-20 mcg/ml Appropriate antibiotic dosing for renal function and indication Eradication of infection  Plan:   Vancomycin 1250mg  IV x 1, then 750mg  IV q24h.  Plan for Vancomycin trough level at steady state. Zosyn 3.375g IV q8h (infuse over 4 hours). Monitor renal function closely given multiple nephrotoxic agents. F/u culture results, clinical course.   Lindell Spar, PharmD, BCPS Pager: [phone] 08/26/2014 6:15 PM

## 2014-08-26 NOTE — Care Management Note (Addendum)
    Page 1 of 1   08/28/2014     3:20:11 PM CARE MANAGEMENT NOTE 08/28/2014  Patient:  [name], [name]   Account Number:  [id number]  Date Initiated:  08/26/2014  Documentation initiated by:  Dessa Phi  Subjective/Objective Assessment:   74 y/o f admitted w/Htn urgency.     Action/Plan:   From home w/son.   Anticipated DC Date:  08/28/2014   Anticipated DC Plan:  HOME/SELF CARE      DC Planning Services  CM consult  OP Neuro Rehab      Choice offered to / List presented to:  C-1 Patient           Status of service:  Completed, signed off Medicare Important Message given?  YES (If response is "NO", the following Medicare IM given date fields will be blank) Date Medicare IM given:  08/27/2014 Medicare IM given by:  [facility] Date Additional Medicare IM given:   Additional Medicare IM given by:    Discharge Disposition:  HOME/SELF CARE  Per UR Regulation:  Reviewed for med. necessity/level of care/duration of stay  If discussed at [place] of Stay Meetings, dates discussed:    Comments:  08/28/14 Dessa Phi RN BSN NCM [phone] otpt neuro rehab form signed & dated by MD, faxed form, & face sheet w/confirmation to [phone],they will contact patient for otpt appt.Patient voiced understanding.No further d/c needs.  08/27/14 Dessa Phi RN BSn NCM [phone] Pt-re eval-otpt neuro rehab. OTPT neuro rehab form in shadow chart for MD signature, & date.  08/26/14 Dessa Phi RN BSN NCM [phone] PT-HH.Provided patient w/HHC agency list-Patient choose [facility]. TC Kristen rep aware of referral, await HHPT/OT order, & d/c.

## 2014-08-26 NOTE — Telephone Encounter (Signed)
Pt aware of INR results

## 2014-08-26 NOTE — Progress Notes (Signed)
ANTICOAGULATION CONSULT NOTE   Pharmacy Consult for Warfarin Indication: Paroxysmal Atrial Fibrillation  Allergies  Allergen Reactions  . Iodinated Diagnostic Agents Anaphylaxis    Info given by patient  . Milk-Related Compounds     Lactose intolerance  . Darvon [Propoxyphene] Rash  . Hydralazine Anxiety    Facial flushing, pt prefers not to use it.    Patient Measurements: Height: 5\' 2"  (157.5 cm) Weight: 123 lb 7.3 oz (56 kg) IBW/kg (Calculated) : 50.1 Height: 5'2" Weight: 58 kg  Vital Signs: Temp: 98 F (36.7 C) (02/24 0602) Temp Source: Oral (02/24 0602) BP: 138/44 mmHg (02/24 0602) Pulse Rate: 53 (02/24 0602)  Labs:  Recent Labs  08/24/14 1631 08/24/14 2141 08/25/14 0235 08/26/14 0510  HGB 13.2  --  12.7 13.6  HCT 37.5  --  37.7 40.1  PLT 135*  --  150 173  LABPROT 25.4*  --  25.3* 26.9*  INR 2.29*  --  2.28* 2.47*  CREATININE 0.76  --  0.87 1.26*  TROPONINI 0.03 0.03 0.03  --    Estimated Creatinine Clearance: 30.5 mL/min (by C-G formula based on Cr of 1.26).  Medical History: Past Medical History  Diagnosis Date  . CHF (congestive heart failure)   . Hypertension   . Anemia   . Broken back 2013  . Atrial fibrillation   . GERD (gastroesophageal reflux disease)   . Arthritis    Assessment: 25 y/oF with PMH of CHF, essential HTN, anemia, PAF, GERD, and arthritis who presents with complaints of elevated blood pressure with symptoms of dizziness, lightheadedness, occipital headache, blurred vision, and generalized tingling and numbness. CT of head with old infarcts, no acute intracranial abnormality. Pharmacy consulted to assist with dosing of Warfarin during inpatient stay.  Home dose Warfarin 5mg  daily at bedtime   Today, 08/26/2014: INR 2.47 H/H stable, Platelets improved Scr 1.26, increased Toradol added prn, monitor CBC and renal function  Goal of Therapy:  INR 2-3 Monitor platelets by anticoagulation protocol: Yes   Plan:   Warfarin  5mg  today at 1800  Daily PT/INR.  CBC at least q72h while inpatient.  Monitor for signs of bleeding.  Dolly Rias RPh 08/26/2014, 10:28 AM Pager [phone]

## 2014-08-26 NOTE — Progress Notes (Signed)
Patient in a fib with temp of 101.6.  Dr. [place] Pink notified.  New orders received.  Will carry out orders and continue to monitor patient.

## 2014-08-27 DIAGNOSIS — Z79891 Long term (current) use of opiate analgesic: Secondary | ICD-10-CM | POA: Diagnosis not present

## 2014-08-27 DIAGNOSIS — H538 Other visual disturbances: Secondary | ICD-10-CM | POA: Diagnosis present

## 2014-08-27 DIAGNOSIS — M1990 Unspecified osteoarthritis, unspecified site: Secondary | ICD-10-CM | POA: Diagnosis present

## 2014-08-27 DIAGNOSIS — J181 Lobar pneumonia, unspecified organism: Secondary | ICD-10-CM | POA: Diagnosis not present

## 2014-08-27 DIAGNOSIS — G43909 Migraine, unspecified, not intractable, without status migrainosus: Secondary | ICD-10-CM | POA: Diagnosis present

## 2014-08-27 DIAGNOSIS — I350 Nonrheumatic aortic (valve) stenosis: Secondary | ICD-10-CM | POA: Diagnosis present

## 2014-08-27 DIAGNOSIS — Z8041 Family history of malignant neoplasm of ovary: Secondary | ICD-10-CM | POA: Diagnosis not present

## 2014-08-27 DIAGNOSIS — R509 Fever, unspecified: Secondary | ICD-10-CM | POA: Diagnosis present

## 2014-08-27 DIAGNOSIS — Z888 Allergy status to other drugs, medicaments and biological substances status: Secondary | ICD-10-CM | POA: Diagnosis not present

## 2014-08-27 DIAGNOSIS — Q064 Hydromyelia: Secondary | ICD-10-CM | POA: Diagnosis not present

## 2014-08-27 DIAGNOSIS — J189 Pneumonia, unspecified organism: Secondary | ICD-10-CM | POA: Diagnosis present

## 2014-08-27 DIAGNOSIS — Z823 Family history of stroke: Secondary | ICD-10-CM | POA: Diagnosis not present

## 2014-08-27 DIAGNOSIS — K219 Gastro-esophageal reflux disease without esophagitis: Secondary | ICD-10-CM | POA: Diagnosis present

## 2014-08-27 DIAGNOSIS — Z91041 Radiographic dye allergy status: Secondary | ICD-10-CM | POA: Diagnosis not present

## 2014-08-27 DIAGNOSIS — R42 Dizziness and giddiness: Secondary | ICD-10-CM | POA: Diagnosis present

## 2014-08-27 DIAGNOSIS — E860 Dehydration: Secondary | ICD-10-CM | POA: Diagnosis present

## 2014-08-27 DIAGNOSIS — Z79899 Other long term (current) drug therapy: Secondary | ICD-10-CM | POA: Diagnosis not present

## 2014-08-27 DIAGNOSIS — I509 Heart failure, unspecified: Secondary | ICD-10-CM | POA: Diagnosis present

## 2014-08-27 DIAGNOSIS — Z7901 Long term (current) use of anticoagulants: Secondary | ICD-10-CM | POA: Diagnosis not present

## 2014-08-27 DIAGNOSIS — Z8249 Family history of ischemic heart disease and other diseases of the circulatory system: Secondary | ICD-10-CM | POA: Diagnosis not present

## 2014-08-27 DIAGNOSIS — M549 Dorsalgia, unspecified: Secondary | ICD-10-CM | POA: Diagnosis present

## 2014-08-27 DIAGNOSIS — E785 Hyperlipidemia, unspecified: Secondary | ICD-10-CM | POA: Diagnosis present

## 2014-08-27 DIAGNOSIS — I480 Paroxysmal atrial fibrillation: Secondary | ICD-10-CM | POA: Diagnosis not present

## 2014-08-27 DIAGNOSIS — I10 Essential (primary) hypertension: Secondary | ICD-10-CM | POA: Diagnosis not present

## 2014-08-27 DIAGNOSIS — Z825 Family history of asthma and other chronic lower respiratory diseases: Secondary | ICD-10-CM | POA: Diagnosis not present

## 2014-08-27 DIAGNOSIS — H8110 Benign paroxysmal vertigo, unspecified ear: Secondary | ICD-10-CM | POA: Diagnosis not present

## 2014-08-27 DIAGNOSIS — Z91011 Allergy to milk products: Secondary | ICD-10-CM | POA: Diagnosis not present

## 2014-08-27 LAB — BASIC METABOLIC PANEL
Anion gap: 7 (ref 5–15)
BUN: 24 mg/dL — ABNORMAL HIGH (ref 6–23)
CHLORIDE: 103 mmol/L (ref 96–112)
CO2: 26 mmol/L (ref 19–32)
Calcium: 8.1 mg/dL — ABNORMAL LOW (ref 8.4–10.5)
Creatinine, Ser: 0.9 mg/dL (ref 0.50–1.10)
GFR calc non Af Amer: 61 mL/min — ABNORMAL LOW (ref >90)
GFR, EST AFRICAN AMERICAN: 71 mL/min — AB (ref >90)
Glucose, Bld: 113 mg/dL — ABNORMAL HIGH (ref 70–99)
POTASSIUM: 3.8 mmol/L (ref 3.5–5.1)
SODIUM: 136 mmol/L (ref 135–145)

## 2014-08-27 LAB — CBC
HCT: 39.9 % (ref 36.0–46.0)
HEMOGLOBIN: 13.5 g/dL (ref 12.0–15.0)
MCH: 29.5 pg (ref 26.0–34.0)
MCHC: 33.8 g/dL (ref 30.0–36.0)
MCV: 87.3 fL (ref 78.0–100.0)
PLATELETS: 143 10*3/uL — AB (ref 150–400)
RBC: 4.57 MIL/uL (ref 3.87–5.11)
RDW: 12.5 % (ref 11.5–15.5)
WBC: 5.5 10*3/uL (ref 4.0–10.5)

## 2014-08-27 LAB — PROTIME-INR
INR: 2.77 — ABNORMAL HIGH (ref 0.00–1.49)
PROTHROMBIN TIME: 29.5 s — AB (ref 11.6–15.2)

## 2014-08-27 MED ORDER — TEMAZEPAM 15 MG PO CAPS
15.0000 mg | ORAL_CAPSULE | Freq: Every evening | ORAL | Status: DC | PRN
  Administered 2014-08-27: 15 mg via ORAL
  Filled 2014-08-27: qty 1

## 2014-08-27 MED ORDER — POLYETHYLENE GLYCOL 3350 17 G PO PACK
17.0000 g | PACK | Freq: Every day | ORAL | Status: DC
  Administered 2014-08-27 – 2014-08-28 (×2): 17 g via ORAL
  Filled 2014-08-27 (×2): qty 1

## 2014-08-27 MED ORDER — VANCOMYCIN HCL 500 MG IV SOLR
500.0000 mg | Freq: Two times a day (BID) | INTRAVENOUS | Status: DC
  Administered 2014-08-27 (×2): 500 mg via INTRAVENOUS
  Filled 2014-08-27 (×4): qty 500

## 2014-08-27 MED ORDER — DOCUSATE SODIUM 100 MG PO CAPS
100.0000 mg | ORAL_CAPSULE | Freq: Two times a day (BID) | ORAL | Status: DC
  Administered 2014-08-27 – 2014-08-28 (×3): 100 mg via ORAL
  Filled 2014-08-27 (×3): qty 1

## 2014-08-27 MED ORDER — ALPRAZOLAM 0.25 MG PO TABS: 0.2500 mg | ORAL_TABLET | Freq: Two times a day (BID) | ORAL | Status: DC | PRN

## 2014-08-27 MED ORDER — WARFARIN SODIUM 4 MG PO TABS
4.0000 mg | ORAL_TABLET | Freq: Once | ORAL | Status: AC
  Administered 2014-08-27: 4 mg via ORAL
  Filled 2014-08-27: qty 1

## 2014-08-27 NOTE — Progress Notes (Signed)
ANTIBIOTIC CONSULT NOTE - Follow up  Pharmacy Consult for Vancomycin, Zosyn Indication: Pneumonia  Allergies  Allergen Reactions  . Iodinated Diagnostic Agents Anaphylaxis    Info given by patient  . Milk-Related Compounds     Lactose intolerance  . Darvon [Propoxyphene] Rash  . Hydralazine Anxiety    Facial flushing, pt prefers not to use it.     Patient Measurements: Height: 5\' 2"  (157.5 cm) Weight: 123 lb 14.4 oz (56.2 kg) IBW/kg (Calculated) : 50.1  Vital Signs: Temp: 97.6 F (36.4 C) (02/25 0458) Temp Source: Oral (02/25 0458) BP: 154/46 mmHg (02/25 1101) Pulse Rate: 66 (02/25 1101) Intake/Output from previous day: 02/24 0701 - 02/25 0700 In: 1834.2 [P.O.:480; I.V.:804.2; IV Piggyback:550] Out: 1150 [Urine:1150] Intake/Output from this shift:    Labs:  Recent Labs  08/25/14 0235 08/26/14 0510 08/27/14 0430 08/27/14 1010  WBC 4.9 7.0  --  5.5  HGB 12.7 13.6  --  13.5  PLT 150 173  --  143*  CREATININE 0.87 1.26* 0.90  --    Estimated Creatinine Clearance: 42.7 mL/min (by C-G formula based on Cr of 0.9). No results for input(s): VANCOTROUGH, VANCOPEAK, VANCORANDOM, GENTTROUGH, GENTPEAK, GENTRANDOM, TOBRATROUGH, TOBRAPEAK, TOBRARND, AMIKACINPEAK, AMIKACINTROU, AMIKACIN in the last 72 hours.   Microbiology: Recent Results (from the past 720 hour(s))  MRSA PCR Screening     Status: None   Collection Time: 08/24/14 10:00 PM  Result Value Ref Range Status   MRSA by PCR NEGATIVE NEGATIVE Final    Comment:        The GeneXpert MRSA Assay (FDA approved for NASAL specimens only), is one component of a comprehensive MRSA colonization surveillance program. It is not intended to diagnose MRSA infection nor to guide or monitor treatment for MRSA infections.   Culture, blood (routine x 2)     Status: None (Preliminary result)   Collection Time: 08/26/14  4:46 PM  Result Value Ref Range Status   Specimen Description BLOOD LEFT ARM  Final   Special Requests  BOTTLES DRAWN AEROBIC AND ANAEROBIC 10CC EACH  Final   Culture   Final           BLOOD CULTURE RECEIVED NO GROWTH TO DATE CULTURE WILL BE HELD FOR 5 DAYS BEFORE ISSUING A FINAL NEGATIVE REPORT Performed at [facility]    Report Status PENDING  Incomplete  Culture, blood (routine x 2)     Status: None (Preliminary result)   Collection Time: 08/26/14  4:50 PM  Result Value Ref Range Status   Specimen Description BLOOD LEFT HAND  Final   Special Requests BOTTLES DRAWN AEROBIC AND ANAEROBIC 10CC EACH  Final   Culture   Final           BLOOD CULTURE RECEIVED NO GROWTH TO DATE CULTURE WILL BE HELD FOR 5 DAYS BEFORE ISSUING A FINAL NEGATIVE REPORT Performed at [facility]    Report Status PENDING  Incomplete    Medical History: Past Medical History  Diagnosis Date  . CHF (congestive heart failure)   . Hypertension   . Anemia   . Broken back 2013  . Atrial fibrillation   . GERD (gastroesophageal reflux disease)   . Arthritis     Assessment: 81 y/oF with PMH of CHF, essential HTN, anemia, PAF, GERD, and arthritis who presented to [facility] ED 2/22 with complaints of elevated blood pressure with symptoms of dizziness, lightheadedness, occipital headache, blurred vision, and generalized tingling and numbness. CT of head with old infarcts,  no acute intracranial abnormality. CXR today shows vague opacity in RUL, question PNA. Pharmacy consulted to dose Vancomycin and Zosyn for possible pneumonia.  2/24 >> Vancomycin >> 2/24 >> Zosyn >>   Tmax: 101.6 WBCs: wnl Renal: SCr improved to wnl, 43CG.  2/22 MRSA PCR: negative 2/24 blood x 2: ngtd  Goal of Therapy:  Vancomycin trough level 15-20 mcg/ml Appropriate antibiotic dosing for renal function; eradication of infection  Plan:  Change Vanc to 500mg  IV q12h d/t improved SCr.  Zosyn 3.375g IV Q8H infused over 4hrs. Measure Vanc trough at steady state. Follow up renal fxn, culture results, and clinical course.  Romeo Rabon, PharmD, pager [phone]. 08/27/2014,11:47 AM.

## 2014-08-27 NOTE — Progress Notes (Signed)
ANTICOAGULATION CONSULT NOTE - Follow up  Pharmacy Consult for Warfarin Indication: Paroxysmal Atrial Fibrillation  Allergies  Allergen Reactions  . Iodinated Diagnostic Agents Anaphylaxis    Info given by patient  . Milk-Related Compounds     Lactose intolerance  . Darvon [Propoxyphene] Rash  . Hydralazine Anxiety    Facial flushing, pt prefers not to use it.    Patient Measurements: Height: 5\' 2"  (157.5 cm) Weight: 123 lb 14.4 oz (56.2 kg) IBW/kg (Calculated) : 50.1 Height: 5'2" Weight: 58 kg  Vital Signs: Temp: 97.6 F (36.4 C) (02/25 0458) Temp Source: Oral (02/25 0458) BP: 154/46 mmHg (02/25 1101) Pulse Rate: 66 (02/25 1101)  Labs:  Recent Labs  08/24/14 1631 08/24/14 2141 08/25/14 0235 08/26/14 0510 08/27/14 0430 08/27/14 1010  HGB 13.2  --  12.7 13.6  --  13.5  HCT 37.5  --  37.7 40.1  --  39.9  PLT 135*  --  150 173  --  143*  LABPROT 25.4*  --  25.3* 26.9* 29.5*  --   INR 2.29*  --  2.28* 2.47* 2.77*  --   CREATININE 0.76  --  0.87 1.26* 0.90  --   TROPONINI 0.03 0.03 0.03  --   --   --    Estimated Creatinine Clearance: 42.7 mL/min (by C-G formula based on Cr of 0.9).  Medical History: Past Medical History  Diagnosis Date  . CHF (congestive heart failure)   . Hypertension   . Anemia   . Broken back 2013  . Atrial fibrillation   . GERD (gastroesophageal reflux disease)   . Arthritis    Assessment: 62 y/oF with PMH of CHF, essential HTN, anemia, PAF, GERD, and arthritis who presents with complaints of elevated blood pressure with symptoms of dizziness, lightheadedness, occipital headache, blurred vision, and generalized tingling and numbness. CT of head with old infarcts, no acute intracranial abnormality. Pharmacy consulted to assist with dosing of Warfarin during inpatient stay. Home dose Warfarin 5mg  daily at bedtime  Significant events: 2/22: INR therapeutic on admission, Coumadin 5mg  2/23: INR therapeutic, Coumadin 5mg  2/24: INR  therapeutic, Coumadin 5mg   Today, 08/27/2014: INR remains therapeutic but trending up. H/H stable, Platelets remain low. Diet: regular, tolerating. Toradol added prn, monitor CBC and renal function. Broad spectrum abx can decrease Coumadin requirements.  Goal of Therapy:  INR 2-3 Monitor platelets by anticoagulation protocol: Yes   Plan:   Give lower dose of Coumadin 4mg  today at 1800.  Daily PT/INR.  CBC at least q72h while inpatient.  Monitor for signs of bleeding.  Romeo Rabon, PharmD, pager [phone]. 08/27/2014,11:52 AM.

## 2014-08-27 NOTE — Progress Notes (Signed)
Physical Therapy Treatment Patient Details Name: [name] MRN: [id number] DOB: 1941-03-08 Today's Date: 08/27/2014    History of Present Illness Pt was admitted for hypertensive urgency. She c/o dizzinesss and lightheadedness.  Pt has a h/o P A-Fib and chronic back pain. 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 Also note s/p C3-4, C5-6, C6-7 ACDF    PT Comments    Pt reports recent blurry vision and "spinning" sensation which with onset laying still and/or with movement in variable periods (sometimes short lived, sometimes lasts hours).  Pt also with cervical surgical hx with limited ROM of neck, rounded shoulders, and forward head posture at rest.  Pt displays spontaneous, gaze holding and smooth pursuits horizontal nystagmus and difficulty with saccades which are central signs.  Pt wears corrective glasses which she wore during oculomotor testing.  Pt with blurry vision with all testing as well.  Pt states she was told to see an ophthalmologist prior to admission so recommended she first f/u with them and then neurologist prior to further PT however if symptoms persist, outpatient neurorehab PT would be best option for meeting pt needs (pt provided with referral form).   Follow Up Recommendations  Outpatient PT     Equipment Recommendations  None recommended by PT    Recommendations for Other Services       Precautions / Restrictions Precautions Precautions: Fall    Mobility  Bed Mobility Overal bed mobility: Needs Assistance Bed Mobility: Supine to Sit;Sit to Supine     Supine to sit: Supervision Sit to supine: Supervision   General bed mobility comments: used bedrail, reports mild dizziness  Transfers Overall transfer level: Needs assistance Equipment used: None Transfers: Sit to/from Stand Sit to Stand: Min guard         General transfer  comment: min/guard for safety  Ambulation/Gait Ambulation/Gait assistance: Min guard Ambulation Distance (Feet): 160 Feet Assistive device: None (pushed IV pole) Gait Pattern/deviations: Step-through pattern     General Gait Details: pt reports blurry vision, no unsteadiness observed, pt reports she has "walking stick" and RW to use at home if needed, states she is careful   Stairs            Wheelchair Mobility    Modified Rankin (Stroke Patients Only)       Balance                                    Cognition Arousal/Alertness: Awake/alert Behavior During Therapy: WFL for tasks assessed/performed Overall Cognitive Status: Within Functional Limits for tasks assessed                      Exercises      General Comments        Pertinent Vitals/Pain Pain Assessment: Faces Faces Pain Scale: Hurts little more Pain Location: all over, mostly neck and shoulder muscles Pain Descriptors / Indicators: Aching Pain Intervention(s): Limited activity within patient's tolerance;Monitored during session    Home Living                      Prior Function            PT Goals (current goals can now be found in the care plan section) Progress towards PT goals: Progressing toward goals  Frequency  Min 3X/week    PT Plan Discharge plan needs to be updated    Co-evaluation             End of Session   Activity Tolerance: Patient tolerated treatment well Patient left: with call bell/phone within reach;in bed     Time: 1023-1049 PT Time Calculation (min) (ACUTE ONLY): 26 min  Charges:  $Gait Training: 8-22 mins $Physical Performance Test: 8-22 mins                    G Codes:      Eitan Doubleday,KATHrine E 09-12-14, 1:24 PM Carmelia Bake, PT, DPT September 12, 2014 Pager: [phone]

## 2014-08-27 NOTE — Progress Notes (Signed)
Patient ID: [name], female   DOB: 09/16/1940, 74 y.o.   MRN: [id number]  [facility] PROGRESS NOTE  JAYRA CHOYCE YYQ:[id number] DOB: 1941/06/21 DOA: 08/24/2014 PCP: Mauricio Po, [name]   Brief narrative:    74 y.o. female with HTN, paroxysmal a-fib on Coumadin at home, GERD, migraine headaches, presented to [facility] ED with main concern of several days duration of dizziness and light headedness, elevated BP at home up to 160's/90's. This is occasionally associated with neck pain and even some anterior chest discomfort, blurry vision. Episodes can last minutes to several hours and typically worse with standing or sitting up or with ambulation. She reports compliance with medications, no recent changes in medical regimen. Pt lives at home and reports independency with ADL.  In ED, pt noted to have SBP as high as 220's and as low as 90's, HR 40 - 70's, NSR. [facility] asked to admit for further evaluation.  Assessment/Plan:    Paroxysmal atrial fibrillation Patient went into rapid 22 response yesterday afternoon. She was given bolus doses of Cardizem and then metoprolol intravenously. Subsequently, heart rate came down. This morning she is ranging between 60-80. Continue with current medications. Continue Coumadin per pharmacy.  Fever with questionable right lung lobar pneumonia Patient spiked fever to 102F yesterday. Cultures are negative so far. Chest x-ray base. The possibility of right lung opacity. She does have a cough although it is dry. Since we have no other source of fever we initiated broad-spectrum antibiotics. Patient hasn't had any further episodes of fever. If she remains afebrile we can de-escalate antibiotics tomorrow. Lactic acid was normal. WBC is normal.  Hypertensive urgency Blood pressure is very well controlled now. Reason for hypertensive urgency is not entirely clear.  Chest pain No further occurrence of chest pain. This was likely related to elevated blood  pressure. Troponins negative 3. EKG was nonischemic.   Occipital headaches Could be migrainous headaches. ESR is normal. Unclear to explain blurred vision. I have told her to see her ophthalmologist for same. She's had MRI of the brain and of the cervical spine. No acute reason has been found. Neurology input is appreciated. Continue gabapentin. PT, OT to see.  Dizziness/vertigo Seems to be improved with the meclizine. Continue the same for now.   Elevated BUN and creatinine Improve with IV hydration. Continue to hold her Lasix for now. Can stop fluids. Ejection fraction was normal in July 2015.  Mild Aortic stenosis - stable for now   HLD - continue statin    DVT prophylaxis - pt is on therapeutic Coumadin Code Status: Full.  Family Communication:  Discussed with patient Disposition Plan: Continue to mobilize. Vestibular evaluation. Not ready for discharge  IV access:  Peripheral IV  Medical Consultants:  Neurology   Other Consultants:  PT/OT  IAnti-Infectives:   None    Subjective: Patient states that her dizziness is better. Continues to have pain in her neck area. Shoulder area and blurred vision. Most of these are chronic.   Objective: Filed Vitals:   08/26/14 1826 08/26/14 2013 08/26/14 2149 08/27/14 0458  BP:  94/42 117/51 111/90  Pulse:  56 81 68  Temp:  97.6 F (36.4 C)  97.6 F (36.4 C)  TempSrc:  Oral  Oral  Resp:  '18 18 18  ' Height:      Weight:    56.2 kg (123 lb 14.4 oz)  SpO2: 97% 94% 94% 96%    Intake/Output Summary (Last 24 hours) at 08/27/14 8889 Last data  filed at 08/27/14 0600  Gross per 24 hour  Intake 1594.17 ml  Output   1150 ml  Net 444.17 ml    Exam:   General:  Pt is alert, follows commands appropriately, not in acute distress  Cardiovascular: Regular rhythm, bradycardic, systolic murmur 3/6, no rubs, no gallops  Respiratory: Clear to auscultation bilaterally, no wheezing, no crackles, no rhonchi  Abdomen: Soft, non  tender, non distended, bowel sounds present, no guarding  Neuro: Alert and oriented 3. Improvement in nystagmus. Cranial nerves otherwise intact. Motor strength equal bilateral upper and lower extremities.  Data Reviewed: Basic Metabolic Panel:  Recent Labs Lab 08/24/14 1631 08/25/14 0235 08/26/14 0510 08/27/14 0430  NA 139 138 140 136  K 4.3 3.7 4.1 3.8  CL 109 105 101 103  CO2 '25 27 30 26  ' GLUCOSE 100* 159* 119* 113*  BUN 14 14 24* 24*  CREATININE 0.76 0.87 1.26* 0.90  CALCIUM 8.8 8.8 8.6 8.1*   Liver Function Tests:  Recent Labs Lab 08/25/14 0235  AST 19  ALT 14  ALKPHOS 77  BILITOT 0.6  PROT 6.2  ALBUMIN 3.6   CBC:  Recent Labs Lab 08/24/14 1631 08/25/14 0235 08/26/14 0510  WBC 6.4 4.9 7.0  NEUTROABS 4.2 2.6  --   HGB 13.2 12.7 13.6  HCT 37.5 37.7 40.1  MCV 84.7 85.9 86.6  PLT 135* 150 173   Cardiac Enzymes:  Recent Labs Lab 08/24/14 1631 08/24/14 2141 08/25/14 0235  TROPONINI 0.03 0.03 0.03   Recent Results (from the past 240 hour(s))  MRSA PCR Screening     Status: None   Collection Time: 08/24/14 10:00 PM  Result Value Ref Range Status   MRSA by PCR NEGATIVE NEGATIVE Final    Comment:        The GeneXpert MRSA Assay (FDA approved for NASAL specimens only), is one component of a comprehensive MRSA colonization surveillance program. It is not intended to diagnose MRSA infection nor to guide or monitor treatment for MRSA infections.      Scheduled Meds: . amLODipine  10 mg Oral Daily  . cloNIDine  0.1 mg Oral Daily  . docusate sodium  100 mg Oral BID  . gabapentin  100 mg Oral TID  . lisinopril  20 mg Oral Daily  . meclizine  25 mg Oral TID  . metoprolol  25 mg Oral BID  . pantoprazole  40 mg Oral Daily  . piperacillin-tazobactam (ZOSYN)  IV  3.375 g Intravenous Q8H  . polyethylene glycol  17 g Oral Daily  . promethazine  12.5 mg Intravenous Once  . simvastatin  20 mg Oral Daily  . sodium chloride  3 mL Intravenous Q12H  .  vancomycin  750 mg Intravenous Q24H  . Warfarin - Pharmacist Dosing Inpatient   Does not apply q1800   Continuous Infusions: . sodium chloride 50 mL/hr at 08/26/14 2158      Bonnielee Haff, MD  [facility] Pager [phone]  If 7PM-7AM, please contact night-coverage [email or web address] Password [facility]  08/27/2014 9:28 AM   LOS: 3 days

## 2014-08-28 LAB — BASIC METABOLIC PANEL
Anion gap: 11 (ref 5–15)
BUN: 16 mg/dL (ref 6–23)
CALCIUM: 8.6 mg/dL (ref 8.4–10.5)
CO2: 24 mmol/L (ref 19–32)
Chloride: 110 mmol/L (ref 96–112)
Creatinine, Ser: 0.88 mg/dL (ref 0.50–1.10)
GFR calc Af Amer: 73 mL/min — ABNORMAL LOW (ref >90)
GFR, EST NON AFRICAN AMERICAN: 63 mL/min — AB (ref >90)
Glucose, Bld: 103 mg/dL — ABNORMAL HIGH (ref 70–99)
Potassium: 4.3 mmol/L (ref 3.5–5.1)
Sodium: 145 mmol/L (ref 135–145)

## 2014-08-28 LAB — CBC
HCT: 36.5 % (ref 36.0–46.0)
HEMOGLOBIN: 12.2 g/dL (ref 12.0–15.0)
MCH: 29.3 pg (ref 26.0–34.0)
MCHC: 33.4 g/dL (ref 30.0–36.0)
MCV: 87.5 fL (ref 78.0–100.0)
Platelets: 123 10*3/uL — ABNORMAL LOW (ref 150–400)
RBC: 4.17 MIL/uL (ref 3.87–5.11)
RDW: 12.7 % (ref 11.5–15.5)
WBC: 3.9 10*3/uL — AB (ref 4.0–10.5)

## 2014-08-28 LAB — PROTIME-INR
INR: 2.96 — ABNORMAL HIGH (ref 0.00–1.49)
Prothrombin Time: 31 seconds — ABNORMAL HIGH (ref 11.6–15.2)

## 2014-08-28 MED ORDER — BUTALBITAL-APAP-CAFFEINE 50-325-40 MG PO TABS: 1.0000 | ORAL_TABLET | ORAL | Status: DC | PRN

## 2014-08-28 MED ORDER — MECLIZINE HCL 25 MG PO TABS: 25.0000 mg | ORAL_TABLET | Freq: Three times a day (TID) | ORAL | Status: DC | PRN

## 2014-08-28 MED ORDER — CLONIDINE HCL 0.1 MG PO TABS: 0.1000 mg | ORAL_TABLET | Freq: Every day | ORAL | Status: DC

## 2014-08-28 MED ORDER — GABAPENTIN 100 MG PO CAPS: 100.0000 mg | ORAL_CAPSULE | Freq: Three times a day (TID) | ORAL | Status: DC

## 2014-08-28 MED ORDER — POLYETHYLENE GLYCOL 3350 17 G PO PACK: 17.0000 g | PACK | Freq: Every day | ORAL | Status: DC | PRN

## 2014-08-28 MED ORDER — WARFARIN SODIUM 5 MG PO TABS
5.0000 mg | ORAL_TABLET | Freq: Every day | ORAL | Status: DC
Start: 2014-08-28 — End: 2015-02-17

## 2014-08-28 MED ORDER — AMOXICILLIN-POT CLAVULANATE 875-125 MG PO TABS: 1.0000 | ORAL_TABLET | Freq: Two times a day (BID) | ORAL | Status: DC

## 2014-08-28 MED ORDER — METOPROLOL TARTRATE 25 MG PO TABS: 25.0000 mg | ORAL_TABLET | Freq: Two times a day (BID) | ORAL | Status: DC

## 2014-08-28 MED ORDER — WARFARIN SODIUM 2 MG PO TABS
2.0000 mg | ORAL_TABLET | Freq: Once | ORAL | Status: DC
  Filled 2014-08-28: qty 1

## 2014-08-28 NOTE — Discharge Instructions (Signed)
Benign Positional Vertigo Vertigo means you feel like you or your surroundings are moving when they are not. Benign positional vertigo is the most common form of vertigo. Benign means that the cause of your condition is not serious. Benign positional vertigo is more common in older adults. CAUSES  Benign positional vertigo is the result of an upset in the labyrinth system. This is an area in the middle ear that helps control your balance. This may be caused by a viral infection, head injury, or repetitive motion. However, often no specific cause is found. SYMPTOMS  Symptoms of benign positional vertigo occur when you move your head or eyes in different directions. Some of the symptoms may include:  Loss of balance and falls.  Vomiting.  Blurred vision.  Dizziness.  Nausea.  Involuntary eye movements (nystagmus). DIAGNOSIS  Benign positional vertigo is usually diagnosed by physical exam. If the specific cause of your benign positional vertigo is unknown, your caregiver may perform imaging tests, such as magnetic resonance imaging (MRI) or computed tomography (CT). TREATMENT  Your caregiver may recommend movements or procedures to correct the benign positional vertigo. Medicines such as meclizine, benzodiazepines, and medicines for nausea may be used to treat your symptoms. In rare cases, if your symptoms are caused by certain conditions that affect the inner ear, you may need surgery. HOME CARE INSTRUCTIONS   Follow your caregiver's instructions.  Move slowly. Do not make sudden body or head movements.  Avoid driving.  Avoid operating heavy machinery.  Avoid performing any tasks that would be dangerous to you or others during a vertigo episode.  Drink enough fluids to keep your urine clear or pale yellow. SEEK IMMEDIATE MEDICAL CARE IF:   You develop problems with walking, weakness, numbness, or using your arms, hands, or legs.  You have difficulty speaking.  You develop  severe headaches.  Your nausea or vomiting continues or gets worse.  You develop visual changes.  Your family or friends notice any behavioral changes.  Your condition gets worse.  You have a fever.  You develop a stiff neck or sensitivity to light. MAKE SURE YOU:   Understand these instructions.  Will watch your condition.  Will get help right away if you are not doing well or get worse. Document Released: 03/27/2006 Document Revised: 09/11/2011 Document Reviewed: 03/09/2011 ExitCare Patient Information 2015 [employer]. This information is not intended to replace advice given to you by your health care provider. Make sure you discuss any questions you have with your health care provider.    

## 2014-08-28 NOTE — Progress Notes (Signed)
ANTICOAGULATION CONSULT NOTE - Follow up  Pharmacy Consult for Warfarin Indication: Paroxysmal Atrial Fibrillation  Allergies  Allergen Reactions  . Iodinated Diagnostic Agents Anaphylaxis    Info given by patient  . Milk-Related Compounds     Lactose intolerance  . Darvon [Propoxyphene] Rash  . Hydralazine Anxiety    Facial flushing, pt prefers not to use it.    Patient Measurements: Height: 5\' 2"  (157.5 cm) Weight: 129 lb 3 oz (58.6 kg) IBW/kg (Calculated) : 50.1  Vital Signs: Temp: 97.8 F (36.6 C) (02/26 0451) Temp Source: Oral (02/26 0451) BP: 129/68 mmHg (02/26 0451) Pulse Rate: 56 (02/26 0451)  Labs:  Recent Labs  08/26/14 0510 08/27/14 0430 08/27/14 1010 08/28/14 0600  HGB 13.6  --  13.5 12.2  HCT 40.1  --  39.9 36.5  PLT 173  --  143* 123*  LABPROT 26.9* 29.5*  --  31.0*  INR 2.47* 2.77*  --  2.96*  CREATININE 1.26* 0.90  --  0.88   Estimated Creatinine Clearance: 43.7 mL/min (by C-G formula based on Cr of 0.88).  Medical History: Past Medical History  Diagnosis Date  . CHF (congestive heart failure)   . Hypertension   . Anemia   . Broken back 2013  . Atrial fibrillation   . GERD (gastroesophageal reflux disease)   . Arthritis    Assessment: 53 y/oF with PMH of CHF, essential HTN, anemia, PAF, GERD, and arthritis who presents with complaints of elevated blood pressure with symptoms of dizziness, lightheadedness, occipital headache, blurred vision, and generalized tingling and numbness. CT of head with old infarcts, no acute intracranial abnormality. Pharmacy consulted to assist with dosing of Warfarin during inpatient stay. Home dose Warfarin 5mg  daily at bedtime  Significant events: 2/22: INR therapeutic on admission, Coumadin 5mg  2/23: INR therapeutic, Coumadin 5mg  2/24: INR therapeutic, Coumadin 5mg  2/25: INR therapeutic, trending up. Coumadin 4mg .  Today, 08/28/2014: INR remains therapeutic but continues to rise. H/H stable, Platelets  trending down slowly. No bleeding reported/documented. Diet: regular, tolerating. Toradol added prn, monitor CBC and renal function. Broad spectrum abx can decrease Coumadin requirements.  Goal of Therapy:  INR 2-3 Monitor platelets by anticoagulation protocol: Yes   Plan:   Give Coumadin 2mg  today at 1800.  Daily PT/INR.  CBC at least q72h while inpatient.  Monitor for signs of bleeding.  Romeo Rabon, PharmD, pager [phone]. 08/28/2014,8:42 AM.

## 2014-08-28 NOTE — Progress Notes (Signed)
Occupational Therapy Treatment Patient Details Name: [name] MRN: [id number] DOB: [birth year] Today's Date: 08/28/2014    History of present illness Pt was admitted for hypertensive urgency. She c/o dizzinesss and lightheadedness.  Pt has a h/o P A-Fib and chronic back pain. 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 Also note s/p C3-4, C5-6, C6-7 ACDF   OT comments  Pt continues to have blurry vision.  Min guard for safety when ambulating.  With set up, pt needed overall supervision for ADL  Follow Up Recommendations  Home health OT    Equipment Recommendations  None recommended by OT    Recommendations for Other Services      Precautions / Restrictions Precautions Precautions: Fall Restrictions Weight Bearing Restrictions: No       Mobility Bed Mobility         Supine to sit: Modified independent (Device/Increase time)     General bed mobility comments: extra time  Transfers     Transfers: Sit to/from Stand Sit to Stand: Min guard         General transfer comment: min/guard for safety    Balance                                   ADL Overall ADL's : Needs assistance/impaired         Upper Body Bathing: Supervision/ safety;Standing   Lower Body Bathing: Set up;Sit to/from stand   Upper Body Dressing : Set up;Sitting   Lower Body Dressing: Supervision/safety;Sit to/from stand   Toilet Transfer: Min guard;Ambulation;BSC             General ADL Comments: ambulated to bathroom and completed ADL.  Pt was working around IV and backed out: cued to go forward and that I would manage line (for optimal safety).  Pt slightly unsteady but no LOB.  Saw a very slight L beating nystagmus, at rest.  This does not improve with fixation.  She has neck pain and really couldn't tolerate small range for VOR, but had difficulty  sustaining gaze.  Blurriness is unchanged/not improved      [employer] information centre manager   Behavior During Therapy: WFL for tasks assessed/performed Overall Cognitive Status: Within Functional Limits for tasks assessed                       Extremity/Trunk Assessment               Exercises     Shoulder Instructions       General Comments      Pertinent Vitals/ Pain       Faces Pain Scale: Hurts little more Pain Location: neck, L hand Pain Descriptors / Indicators: Aching Pain Intervention(s): Limited activity within patient's tolerance  Home Living Family/patient expects to be discharged to:: Private residence Living Arrangements: Children Available Help at Discharge: Family                                    Prior Functioning/Environment  Frequency Min 2X/week     Progress Toward Goals  OT Goals(current goals can now be found in the care plan section)  Progress towards OT goals: Progressing toward goals     Plan Discharge plan remains appropriate    Co-evaluation                 End of Session     Activity Tolerance Patient tolerated treatment well   Patient Left in chair;with call bell/phone within reach   Nurse Communication          Time: 4193-7902 OT Time Calculation (min): 34 min  Charges: OT General Charges $OT Visit: 1 Procedure OT Treatments $Self Care/Home Management : 23-37 mins  Malonie Tatum 08/28/2014, 1:00 PM   Lesle Chris, OTR/L [phone] 08/28/2014

## 2014-08-28 NOTE — Discharge Summary (Signed)
[facility]  Physician Discharge Summary   Patient ID: [name] MRN: [id number] DOB/AGE: 75-Apr-1942 75 y.o.  Admit date: 08/24/2014 Discharge date: 08/28/2014  PCP: Mauricio Po, [name]  DISCHARGE DIAGNOSES:  Principal Problem:   Hypertensive urgency Active Problems:   Chest pain   Aortic stenosis   Back pain   Paroxysmal atrial fibrillation   Benign paroxysmal positional vertigo   Pneumonia   RECOMMENDATIONS FOR OUTPATIENT FOLLOW UP: 1. Outpatient PT and OT recommended. 2. Follow-up with PCP for further management of hypertension and vertigo 3. She has been lost to hold her warfarin for 2 days. She will need PT/INR on Monday or Tuesday 4. She has been asked to see ophthalmologist for further problems with blurry vision.   DISCHARGE CONDITION: fair  Diet recommendation: Heart healthy  Filed Weights   08/26/14 0609 08/27/14 0458 08/28/14 0451  Weight: 56 kg (123 lb 7.3 oz) 56.2 kg (123 lb 14.4 oz) 58.6 kg (129 lb 3 oz)    INITIAL HISTORY: 74 y.o. female with HTN, paroxysmal a-fib on Coumadin at home, GERD, migraine headaches, presented to [facility] ED with main concern of several days duration of dizziness and light headedness, elevated BP at home up to 160's/90's. This was occasionally associated with neck pain and even some anterior chest discomfort, blurry vision. Episodes would last minutes to several hours and typically worse with standing or sitting up or with ambulation. She reported compliance with medications, no recent changes in medical regimen. Pt lives at home and reports independency with ADL. In ED, pt noted to have SBP as high as 220's and as low as 90's, HR 40 - 70's, NSR. TRH asked to admit for further evaluation.  Consultations:  Neurology  HOSPITAL COURSE:   Paroxysmal atrial fibrillation Heart rate has been very well controlled for the last 2 days. The rapid ventricular response, was likely due to her fever. She was given bolus doses of  Cardizem with it. Heart rate came down. She may continue with the current medication regimen. Please note that her dose of beta blocker had to be reduced due to baseline bradycardia. Warfarin to continue. Will be asked to hold 2 doses and then resume after that. PT/INR to be checked early next week.  Fever with questionable right lung lobar pneumonia Patient spiked fever to 102F on 2/24. Chest x-ray suggested a right upper lung opacity. She did have a mild cough. She was initially put on broad-spectrum coverage. Blood cultures have been negative. Lactic acid was normal. UA was unremarkable. WBC was normal. She'll be changed to oral Augmentin today and will be discharged.   Hypertensive urgency The pressure was very high when she presented to the emergency department. Systolic was 703. Reason for her presentation was not entirely clear. MRI of the brain did not show any acute findings. She was started back on her oral agents. Blood pressure got better controlled. Follow-up with her PCP for same.   Chest pain No further occurrence of chest pain. This was likely related to elevated blood pressure. Troponins negative 3. EKG was nonischemic.   Occipital headaches Could be migrainous headaches. ESR is normal. Unclear to explain blurred vision. I have told her to see her ophthalmologist for same. She's had MRI of the brain and of the cervical spine. No acute reason has been found. 3 mm focal 11 mm hydromyelia at C6-7 was noted on MRI of the cervical spine. Neurology discussed this with neurosurgery and is not thought to require any further evaluation.  Neurology did recommend gabapentin for her headaches.   Dizziness/vertigo Seems to be improved with the meclizine. Continue the same for now as needed. PT and OT has seen the patient. Outpatient therapy has been ordered.  Elevated BUN and creatinine Improved with IV hydration. Lasix was held briefly.  Mild Aortic stenosis - stable for now   HLD -  continue statin   Overall improved. Okay for discharge. Needs close follow-up with PCP.   PERTINENT LABS:  The results of significant diagnostics from this hospitalization (including imaging, microbiology, ancillary and laboratory) are listed below for reference.    Microbiology: Recent Results (from the past 240 hour(s))  MRSA PCR Screening     Status: None   Collection Time: 08/24/14 10:00 PM  Result Value Ref Range Status   MRSA by PCR NEGATIVE NEGATIVE Final    Comment:        The GeneXpert MRSA Assay (FDA approved for NASAL specimens only), is one component of a comprehensive MRSA colonization surveillance program. It is not intended to diagnose MRSA infection nor to guide or monitor treatment for MRSA infections.   Culture, blood (routine x 2)     Status: None (Preliminary result)   Collection Time: 08/26/14  4:46 PM  Result Value Ref Range Status   Specimen Description BLOOD LEFT ARM  Final   Special Requests BOTTLES DRAWN AEROBIC AND ANAEROBIC 10CC EACH  Final   Culture   Final           BLOOD CULTURE RECEIVED NO GROWTH TO DATE CULTURE WILL BE HELD FOR 5 DAYS BEFORE ISSUING A FINAL NEGATIVE REPORT Performed at [facility]    Report Status PENDING  Incomplete  Culture, blood (routine x 2)     Status: None (Preliminary result)   Collection Time: 08/26/14  4:50 PM  Result Value Ref Range Status   Specimen Description BLOOD LEFT HAND  Final   Special Requests BOTTLES DRAWN AEROBIC AND ANAEROBIC 10CC EACH  Final   Culture   Final           BLOOD CULTURE RECEIVED NO GROWTH TO DATE CULTURE WILL BE HELD FOR 5 DAYS BEFORE ISSUING A FINAL NEGATIVE REPORT Performed at [facility]    Report Status PENDING  Incomplete     Labs: Basic Metabolic Panel:  Recent Labs Lab 08/24/14 1631 08/25/14 0235 08/26/14 0510 08/27/14 0430 08/28/14 0600  NA 139 138 140 136 145  K 4.3 3.7 4.1 3.8 4.3  CL 109 105 101 103 110  CO2 _0 GLUCOSE 100*  159* 119* 113* 103*  BUN 14 14 24* 24* 16  CREATININE 0.76 0.87 1.26* 0.90 0.88  CALCIUM 8.8 8.8 8.6 8.1* 8.6   Liver Function Tests:  Recent Labs Lab 08/25/14 0235  AST 19  ALT 14  ALKPHOS 77  BILITOT 0.6  PROT 6.2  ALBUMIN 3.6   CBC:  Recent Labs Lab 08/24/14 1631 08/25/14 0235 08/26/14 0510 08/27/14 1010 08/28/14 0600  WBC 6.4 4.9 7.0 5.5 3.9*  NEUTROABS 4.2 2.6  --   --   --   HGB 13.2 12.7 13.6 13.5 12.2  HCT 37.5 37.7 40.1 39.9 36.5  MCV 84.7 85.9 86.6 87.3 87.5  PLT 135* 150 173 143* 123*   Cardiac Enzymes:  Recent Labs Lab 08/24/14 1631 08/24/14 2141 08/25/14 0235  TROPONINI 0.03 0.03 0.03    IMAGING STUDIES Ct Head Wo Contrast  08/24/2014   CLINICAL DATA:  Hypertension. Blurred vision.  Dizziness. Head and neck pain.  EXAM: CT HEAD WITHOUT CONTRAST  TECHNIQUE: Contiguous axial images were obtained from the base of the skull through the vertex without intravenous contrast.  COMPARISON:  10/02/2006  FINDINGS: Sinuses/Soft tissues: Clear paranasal sinuses and mastoid air cells.  Intracranial: Vertebral basilar atherosclerosis. Remote right cerebellar hemisphere and basal ganglia infarcts. No mass lesion, hemorrhage, hydrocephalus, acute infarct, intra-axial, or extra-axial fluid collection.  IMPRESSION: 1.  No acute intracranial abnormality. 2. Remote right cerebellar and basal ganglia infarcts.   Electronically Signed   By: Abigail Miyamoto M.D.   On: 08/24/2014 17:34   Mr Brain Wo Contrast  08/24/2014   CLINICAL DATA:  Dizziness and blurry vision. History of hypertension, atrial fibrillation. Acute symptoms, initial evaluation.  EXAM: MRI HEAD WITHOUT CONTRAST  TECHNIQUE: Multiplanar, multiecho pulse sequences of the brain and surrounding structures were obtained without intravenous contrast.  COMPARISON:  CT of the head August 24, 2014 at 1727 hours and MRI of the brain October 17, 2003  FINDINGS: No reduced diffusion to suggest acute ischemia. No susceptibility  artifact to suggest hemorrhage.  RIGHT superior cerebellar encephalomalacia, present on prior imaging. Tiny remote LEFT cerebellar infarcts. Prominent RIGHT basal ganglia perivascular spaces, superimposed trace FLAIR signal which is new. A few scattered supratentorial white matter FLAIR T2 hyperintensities, progressed from prior imaging though, less than expected for age patient's age. No midline shift, mass effect or mass lesions.  No abnormal extra-axial fluid collections. Normal major intracranial vascular flow voids seen at the skull base.  Ocular globes and orbital contents are unremarkable. Paranasal sinuses and mastoid air cells are well aerated. No abnormal sellar expansion. Partially imaged apparent ACDF. No suspicious calvarial bone marrow signal. No cerebellar tonsillar ectopia.  IMPRESSION: No acute intracranial process, specifically no acute ischemia.  Remote RIGHT superior cerebellar artery territory infarct. Remote RIGHT basal ganglia lacunar infarct.  Mild white matter changes suggest chronic small vessel ischemic disease, advanced from prior MRI though, less than expected for age.   Electronically Signed   By: Elon Alas   On: 08/24/2014 21:21   Mr Cervical Spine W Wo Contrast  08/25/2014   CLINICAL DATA:  Dizziness, headache and chronic neck pain for greater than 3 months. History of cervical disc surgery 2014.  EXAM: MRI CERVICAL SPINE WITHOUT AND WITH CONTRAST  TECHNIQUE: Multiplanar and multiecho pulse sequences of the cervical spine, to include the craniocervical junction and cervicothoracic junction, were obtained according to standard protocol without and with intravenous contrast.  CONTRAST:  21m MULTIHANCE GADOBENATE DIMEGLUMINE 529 MG/ML IV SOLN  COMPARISON:  CT of the cervical spine October 02, 2006  FINDINGS: Cervical vertebral bodies intact, maintenance of cervical lordosis. Grade 1 minimal C4-5 anterolisthesis, similar to prior examination, status post C3-4, C5-6 and C6-7 ACDF,  hardware results in some susceptibility artifact at this level. Non surgically altered disc at C4-5 and at C2-3 demonstrate decreased T2 signal, normal morphology. No STIR signal abnormality to suggest acute osseous process. No suspicious osseous or intradiscal enhancement.  Focally dilated 3 mm central spinal canal at the level of C6-7, 11 mm in craniocaudad dimension. No myelomalacia. Craniocervical junction is intact. Included prevertebral and paraspinal soft tissues are nonsuspicious.  Level by level evaluation:  C2-3: Severe RIGHT, at least moderate LEFT facet arthropathy. No disc bulge. Uncovertebral hypertrophy directed to the RIGHT. Mild canal stenosis. Moderate to severe RIGHT neural foraminal narrowing.  C3-4 ACDF. No canal stenosis. No definite neural foraminal narrowing.  C4-5: Small broad-based disc bulge, uncovertebral hypertrophy  and severe LEFT, moderate RIGHT facet arthropathy. Mild to moderate canal stenosis. At least moderate RIGHT and mild-to-moderate LEFT neural foraminal narrowing.  C5-6: ACDF. Mild canal stenosis. At least moderate bilateral neural foraminal narrowing though hardware artifact limits evaluation.  C6-7: ACDF. Mild canal stenosis. Uncovertebral hypertrophy. Mild bilateral neural foraminal narrowing.  C7-T1:  No disc bulge, canal stenosis or neural foraminal narrowing.  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 No cervical spinal cord edema or abnormal cervical spinal cord enhancement.  Status post C3-4, C5-6 and C6-7 ACDF, which results in some hardware artifact.  Degenerative change of the cervical spine resulting in mild to moderate canal stenosis at C4-5, mild canal stenosis at C5-6 and C6-7.  Neural foraminal narrowing C2-3, C4-5 through C6-7: Moderate to severe on the RIGHT at C2-3.   Electronically Signed   By: Elon Alas   On: 08/25/2014 22:51   Dg Chest Port 1 View  08/26/2014   CLINICAL DATA:  Fever.  EXAM: PORTABLE CHEST - 1 VIEW  COMPARISON:   10/19/2010  FINDINGS: Heart and mediastinal contours are within normal limits. Left lung is clear. Vague opacity projects over the right upper lobe. Cannot exclude right upper lobe infiltrate. No effusions. No acute bony abnormality.  IMPRESSION: Vague opacity in the right upper lobe, question pneumonia.   Electronically Signed   By: Rolm Baptise M.D.   On: 08/26/2014 16:07    DISCHARGE EXAMINATION: Filed Vitals:   08/27/14 1101 08/27/14 1510 08/27/14 2115 08/28/14 0451  BP: 154/46 119/49 155/43 129/68  Pulse: 66 58 59 56  Temp:  97.9 F (36.6 C) 97.8 F (36.6 C) 97.8 F (36.6 C)  TempSrc:  Oral Oral Oral  Resp:  _0 Height:      Weight:    58.6 kg (129 lb 3 oz)  SpO2:  98% 99% 99%   General appearance: alert, cooperative, appears stated age and no distress Resp: clear to auscultation bilaterally Cardio: regular rate and rhythm, S1, S2 normal, no murmur, click, rub or gallop GI: soft, non-tender; bowel sounds normal; no masses,  no organomegaly  DISPOSITION: Home with son  Discharge Instructions    Ambulatory referral to Occupational Therapy    Complete by:  As directed      Ambulatory referral to Physical Therapy    Complete by:  As directed      Call MD for:  persistant dizziness or light-headedness    Complete by:  As directed      Call MD for:  persistant nausea and vomiting    Complete by:  As directed      Call MD for:  redness, tenderness, or signs of infection (pain, swelling, redness, odor or green/yellow discharge around incision site)    Complete by:  As directed      Call MD for:  severe uncontrolled pain    Complete by:  As directed      Call MD for:  temperature >100.4    Complete by:  As directed      Diet - low sodium heart healthy    Complete by:  As directed      Discharge instructions    Complete by:  As directed   Do not take your warfarin on 2/26, 2/27. Resume on 2/28. You will need to have your INR checked on Monday or Tuesday. Seek attention if  you note any bleeding.     Increase activity slowly    Complete by:  As  directed            ALLERGIES:  Allergies  Allergen Reactions  . Iodinated Diagnostic Agents Anaphylaxis    Info given by patient  . Milk-Related Compounds     Lactose intolerance  . Darvon [Propoxyphene] Rash  . Hydralazine Anxiety    Facial flushing, pt prefers not to use it.      Current Discharge Medication List    START taking these medications   Details  amoxicillin-clavulanate (AUGMENTIN) 875-125 MG per tablet Take 1 tablet by mouth every 12 (twelve) hours. For 6 more days Qty: 12 tablet, Refills: 0    butalbital-acetaminophen-caffeine (FIORICET, ESGIC) 50-325-40 MG per tablet Take 1 tablet by mouth every 4 (four) hours as needed for headache. Qty: 14 tablet, Refills: 0    gabapentin (NEURONTIN) 100 MG capsule Take 1 capsule (100 mg total) by mouth 3 (three) times daily. Qty: 90 capsule, Refills: 0    meclizine (ANTIVERT) 25 MG tablet Take 1 tablet (25 mg total) by mouth 3 (three) times daily as needed for dizziness. Qty: 30 tablet, Refills: 0    polyethylene glycol (MIRALAX / GLYCOLAX) packet Take 17 g by mouth daily as needed for moderate constipation. Qty: 14 each, Refills: 0      CONTINUE these medications which have CHANGED   Details  cloNIDine (CATAPRES) 0.1 MG tablet Take 1 tablet (0.1 mg total) by mouth daily. Qty: 30 tablet, Refills: 0    metoprolol (LOPRESSOR) 25 MG tablet Take 1 tablet (25 mg total) by mouth 2 (two) times daily. Qty: 60 tablet, Refills: 0    warfarin (COUMADIN) 5 MG tablet Take 1 tablet (5 mg total) by mouth at bedtime. As directed per INR. Please do not take on 2/26 and 2/27. Resume on 08/30/14. INR check on Monday or Tuesday.      CONTINUE these medications which have NOT CHANGED   Details  amLODipine (NORVASC) 10 MG tablet Take 1 tablet (10 mg total) by mouth daily. Qty: 90 tablet, Refills: 3    furosemide (LASIX) 40 MG tablet Take 1-2 tablets (40-80 mg  total) by mouth daily. Qty: 30 tablet, Refills: 1    lisinopril (PRINIVIL,ZESTRIL) 20 MG tablet Take 1 tablet (20 mg total) by mouth daily. Qty: 90 tablet, Refills: 3    metoCLOPramide (REGLAN) 10 MG tablet Take 10 mg by mouth every 6 (six) hours as needed for nausea.    oxycodone (OXY-IR) 5 MG capsule Take 1 capsule (5 mg total) by mouth 2 (two) times daily as needed for pain. Qty: 60 capsule, Refills: 0    pantoprazole (PROTONIX) 40 MG tablet Take 1 tablet (40 mg total) by mouth daily. Qty: 90 tablet, Refills: 3    simvastatin (ZOCOR) 20 MG tablet Take 1 tablet (20 mg total) by mouth daily. Qty: 90 tablet, Refills: 3    temazepam (RESTORIL) 15 MG capsule Take 1 capsule (15 mg total) by mouth at bedtime. Qty: 30 capsule, Refills: 0   Associated Diagnoses: Sleep disturbance    traMADol (ULTRAM) 50 MG tablet Take by mouth every 6 (six) hours as needed.    vitamin B-12 (CYANOCOBALAMIN) 1000 MCG tablet Take 1,000 mcg by mouth daily.    tizanidine (ZANAFLEX) 2 MG capsule Take 2 mg by mouth every 6 (six) hours as needed for muscle spasms.       Follow-up Information    Follow up with [facility].   Why:  They will call to set up appt.   Contact information:  [place] [address] [phone] [phone]      Follow up with Mauricio Po, [name].   Specialty:  Family Medicine   Why:  for PT/INR check on monday or tuesday.   Contact information:   [place] [address] [phone]       TOTAL DISCHARGE TIME: 35 mins  [facility] Pager [phone]  08/28/2014, 1:04 PM

## 2014-08-31 DIAGNOSIS — H5203 Hypermetropia, bilateral: Secondary | ICD-10-CM | POA: Diagnosis not present

## 2014-08-31 DIAGNOSIS — H3530 Unspecified macular degeneration: Secondary | ICD-10-CM | POA: Diagnosis not present

## 2014-08-31 DIAGNOSIS — H3531 Nonexudative age-related macular degeneration: Secondary | ICD-10-CM | POA: Diagnosis not present

## 2014-08-31 DIAGNOSIS — H524 Presbyopia: Secondary | ICD-10-CM | POA: Diagnosis not present

## 2014-08-31 DIAGNOSIS — H52223 Regular astigmatism, bilateral: Secondary | ICD-10-CM | POA: Diagnosis not present

## 2014-09-01 ENCOUNTER — Encounter: Payer: Self-pay | Admitting: Family

## 2014-09-01 ENCOUNTER — Ambulatory Visit (INDEPENDENT_AMBULATORY_CARE_PROVIDER_SITE_OTHER): Payer: 59 | Admitting: Family

## 2014-09-01 VITALS — BP 160/78 | HR 59 | Temp 97.9°F | Resp 18 | Ht 62.0 in | Wt 128.7 lb

## 2014-09-01 DIAGNOSIS — M545 Low back pain: Secondary | ICD-10-CM | POA: Diagnosis not present

## 2014-09-01 DIAGNOSIS — I10 Essential (primary) hypertension: Secondary | ICD-10-CM

## 2014-09-01 DIAGNOSIS — H3530 Unspecified macular degeneration: Secondary | ICD-10-CM | POA: Diagnosis not present

## 2014-09-01 DIAGNOSIS — I480 Paroxysmal atrial fibrillation: Secondary | ICD-10-CM

## 2014-09-01 LAB — CULTURE, BLOOD (ROUTINE X 2)
CULTURE: NO GROWTH
Culture: NO GROWTH

## 2014-09-01 MED ORDER — METHYLPREDNISOLONE (PAK) 4 MG PO TABS: ORAL_TABLET | ORAL | Status: DC

## 2014-09-01 MED ORDER — LISINOPRIL 40 MG PO TABS: 40.0000 mg | ORAL_TABLET | Freq: Every day | ORAL | Status: DC

## 2014-09-01 NOTE — Assessment & Plan Note (Signed)
Stable. Currently in normal sinus. Continue metoprolol for HR control and blood pressure.

## 2014-09-01 NOTE — Assessment & Plan Note (Signed)
Blood pressure remains increased with current regiment. Opthalmology reveals macular degeneration of eyes. Continue amlodipine, clonidine, and metoprolol at current dosages. Increase lisinopril to 40 mg daily. Follow up in 2 weeks to determine effectiveness of treatment.

## 2014-09-01 NOTE — Patient Instructions (Addendum)
  Thank you for choosing [facility].  Summary/Instructions:  Blood pressure medications -   Morning - Clonidine, Lisinopril, Metoprolol, and Amlodipine  Evening - Clonidine, Metoprolol   Please take 40 mg of your lisinopril daily (in the morning). This will be 2 tablets of your current dosage. When you refill the medication the pills will be 40 mg so please only take 1 tablet daily.  For your back - please start taking the methylprednisilone (steroid) as directed on the medication package.  Your prescription(s) have been submitted to your pharmacy or been printed and provided for you. Please take as directed and contact our office if you believe you are having problem(s) with the medication(s) or have any questions.  Referrals have been made during this visit. You should expect to hear back from our schedulers in about 7-10 days in regards to establishing an appointment with the specialists we discussed.   If your symptoms worsen or fail to improve, please contact our office for further instruction, or in case of emergency go directly to the emergency room at the closest medical facility.

## 2014-09-01 NOTE — Progress Notes (Signed)
Pre visit review using our clinic review tool, if applicable. No additional management support is needed unless otherwise documented below in the visit note. 

## 2014-09-01 NOTE — Assessment & Plan Note (Signed)
Seen by opthalmology and is being referred to a macular degeneration specialist. Follow up as needed.

## 2014-09-01 NOTE — Progress Notes (Signed)
Subjective:    Patient ID: [name], female    DOB: 01-05-1941, 74 y.o.   MRN: [id number]  Chief Complaint  Patient presents with  . Hospitalization Follow-up    she stated that her BP hasn't been much better since she left the hospital but its lower today, the pain in her hands has eased up but she now has pain in lower left back going down leg    HPI:  [name] is a 74 y.o. female who presents today for a hospital follow.  She was recently seen in the office for hypertension, blurred vision and neck pain. At the time it was decided for her to go to ED for follow up. She received a CT scan in the ED which was negative for intracranial hemorrhage. While in the hospital she had an episode of tachycardia which was treated as paroxysmal atrial fibrillation. She also developed a fever and was treated for right lung lobar pneumonia. MRI of her head was negative for stroke, and headaches were questionably related to potential migraines. All hospital records were reviewed and imaging was individually viewed.  1) Blurred vision - She was seen by opthalmology yesterday and was found to have macular degeneration.  2) Blood pressure - Indicates that since she left the hospital her blood pressure readings have been elevated, home readings have been averaging in the 170s and 180s. Currently treated with amlodipine, lisinopril, metoprolol, clonidine.   BP Readings from Last 3 Encounters:  09/01/14 160/78  08/28/14 129/68  08/24/14 210/78   3) PAF - Stable with current regimen and anticoagulated on warfarin.   4) Low back pain - indicates she continues to experience the associated symptoms of low back pain with radiculopathy down her left lower extremity. She was recently started on gabapentin which has helped with some of the general neuropathic pain.    Allergies  Allergen Reactions  . Iodinated Diagnostic Agents Anaphylaxis    Info given by patient  . Milk-Related Compounds    Lactose intolerance  . Darvon [Propoxyphene] Rash  . Hydralazine Anxiety    Facial flushing, pt prefers not to use it.     Current Outpatient Prescriptions on File Prior to Visit  Medication Sig Dispense Refill  . amLODipine (NORVASC) 10 MG tablet Take 1 tablet (10 mg total) by mouth daily. 90 tablet 3  . amoxicillin-clavulanate (AUGMENTIN) 875-125 MG per tablet Take 1 tablet by mouth every 12 (twelve) hours. For 6 more days 12 tablet 0  . butalbital-acetaminophen-caffeine (FIORICET, ESGIC) 50-325-40 MG per tablet Take 1 tablet by mouth every 4 (four) hours as needed for headache. 14 tablet 0  . cloNIDine (CATAPRES) 0.1 MG tablet Take 1 tablet (0.1 mg total) by mouth daily. 30 tablet 0  . furosemide (LASIX) 40 MG tablet Take 1-2 tablets (40-80 mg total) by mouth daily. 30 tablet 1  . gabapentin (NEURONTIN) 100 MG capsule Take 1 capsule (100 mg total) by mouth 3 (three) times daily. 90 capsule 0  . meclizine (ANTIVERT) 25 MG tablet Take 1 tablet (25 mg total) by mouth 3 (three) times daily as needed for dizziness. 30 tablet 0  . metoCLOPramide (REGLAN) 10 MG tablet Take 10 mg by mouth every 6 (six) hours as needed for nausea.    . metoprolol (LOPRESSOR) 25 MG tablet Take 1 tablet (25 mg total) by mouth 2 (two) times daily. 60 tablet 0  . oxycodone (OXY-IR) 5 MG capsule Take 1 capsule (5 mg total) by mouth 2 (  two) times daily as needed for pain. 60 capsule 0  . pantoprazole (PROTONIX) 40 MG tablet Take 1 tablet (40 mg total) by mouth daily. 90 tablet 3  . polyethylene glycol (MIRALAX / GLYCOLAX) packet Take 17 g by mouth daily as needed for moderate constipation. 14 each 0  . simvastatin (ZOCOR) 20 MG tablet Take 1 tablet (20 mg total) by mouth daily. 90 tablet 3  . temazepam (RESTORIL) 15 MG capsule Take 1 capsule (15 mg total) by mouth at bedtime. 30 capsule 0  . tizanidine (ZANAFLEX) 2 MG capsule Take 2 mg by mouth every 6 (six) hours as needed for muscle spasms.    . traMADol (ULTRAM) 50 MG  tablet Take by mouth every 6 (six) hours as needed.    . vitamin B-12 (CYANOCOBALAMIN) 1000 MCG tablet Take 1,000 mcg by mouth daily.    [name] warfarin (COUMADIN) 5 MG tablet Take 1 tablet (5 mg total) by mouth at bedtime. As directed per INR. Please do not take on 2/26 and 2/27. Resume on 08/30/14. INR check on Monday or Tuesday.     No current facility-administered medications on file prior to visit.     Past Medical History  Diagnosis Date  . CHF (congestive heart failure)   . Hypertension   . Anemia   . Broken back 2013  . Atrial fibrillation   . GERD (gastroesophageal reflux disease)   . Arthritis      Review of Systems  Respiratory: Negative for chest tightness.   Cardiovascular: Negative for chest pain, palpitations and leg swelling.  Musculoskeletal: Positive for back pain.  Neurological: Negative for headaches.      Objective:    BP 160/78 mmHg  Pulse 59  Temp(Src) 97.9 F (36.6 C) (Oral)  Resp 18  Ht 5\' 2"  (1.575 m)  Wt 128 lb 11.2 oz (58.378 kg)  BMI 23.53 kg/m2  SpO2 96% Nursing note and vital signs reviewed.  Physical Exam  Constitutional: She is oriented to person, place, and time. She appears well-developed and well-nourished. No distress.  Eyes: Conjunctivae and EOM are normal. Pupils are equal, round, and reactive to light.  Cardiovascular: Normal rate, regular rhythm, normal heart sounds and intact distal pulses.   Pulmonary/Chest: Effort normal and breath sounds normal.  Neurological: She is alert and oriented to person, place, and time.  Skin: Skin is warm and dry.  Psychiatric: She has a normal mood and affect. Her behavior is normal. Judgment and thought content normal.       Assessment & Plan:

## 2014-09-01 NOTE — Assessment & Plan Note (Addendum)
Continues to experience back pain with some radiculopathy. Start Medrol dosepack. Refer to orthopedics for follow up on back pain.

## 2014-09-03 ENCOUNTER — Ambulatory Visit: Payer: Medicare Other | Attending: Internal Medicine | Admitting: Rehabilitative and Restorative Service Providers"

## 2014-09-03 VITALS — BP 138/78

## 2014-09-03 DIAGNOSIS — I4891 Unspecified atrial fibrillation: Secondary | ICD-10-CM | POA: Diagnosis not present

## 2014-09-03 DIAGNOSIS — H3530 Unspecified macular degeneration: Secondary | ICD-10-CM | POA: Diagnosis not present

## 2014-09-03 DIAGNOSIS — I10 Essential (primary) hypertension: Secondary | ICD-10-CM | POA: Insufficient documentation

## 2014-09-03 DIAGNOSIS — R269 Unspecified abnormalities of gait and mobility: Secondary | ICD-10-CM

## 2014-09-03 DIAGNOSIS — H8110 Benign paroxysmal vertigo, unspecified ear: Secondary | ICD-10-CM | POA: Diagnosis not present

## 2014-09-03 DIAGNOSIS — M549 Dorsalgia, unspecified: Secondary | ICD-10-CM | POA: Diagnosis not present

## 2014-09-03 DIAGNOSIS — H538 Other visual disturbances: Secondary | ICD-10-CM | POA: Diagnosis not present

## 2014-09-03 DIAGNOSIS — I509 Heart failure, unspecified: Secondary | ICD-10-CM | POA: Diagnosis not present

## 2014-09-03 DIAGNOSIS — K219 Gastro-esophageal reflux disease without esophagitis: Secondary | ICD-10-CM | POA: Insufficient documentation

## 2014-09-03 NOTE — Therapy (Signed)
[facility] [address] [place], [place], [address] Phone: [phone]   Fax:  [phone]  Physical Therapy Evaluation  Patient Details  Name: [name] MRN: [id number] Date of Birth: 1941-05-28 Referring Provider:  Mauricio Po, [name]  Encounter Date: 09/03/2014      PT End of Session - 09/03/14 1353    Visit Number 1   Number of Visits 4   Date for PT Re-Evaluation 10/05/14   PT Start Time 1022   PT Stop Time 1104   PT Time Calculation (min) 42 min   Equipment Utilized During Treatment Gait belt   Activity Tolerance Patient tolerated treatment well   Behavior During Therapy [facility] for tasks assessed/performed      Past Medical History  Diagnosis Date  . CHF (congestive heart failure)   . Hypertension   . Anemia   . Broken back 2013  . Atrial fibrillation   . GERD (gastroesophageal reflux disease)   . Arthritis     Past Surgical History  Procedure Laterality Date  . Lumbar disc surgery  1991  . Lumbar back surgery  2012  . Cervical disc surgery  2014  . Cholecystectomy  1980s  . Appendectomy  1950s  . Tonsillectomy and adenoidectomy  1960s  . Carpal tunnel release Bilateral 1990s    BP 138/78 mmHg  Visit Diagnosis:  Abnormality of gait      Subjective Assessment - 09/03/14 1026    Symptoms The patient reports recent onset of visual blurring, dizziness and worsening balance.  She was admitted to [facility] 08/28/14 with elevated blood pressure.  She also has h/o migraines and c/o occipital headaches that are intermittent in nature.   "My vision is giving me a fit". Pt went to eye doctor and has macular degeneration that is worsening per report.  Her BP this morning was 158/58.     Currently in Pain? Yes   Pain Score 8    Pain Location --  "I am a ball of pain", hurts everywhere   Aggravating Factors  worse with activity          [facility] PT Assessment - 09/03/14 1033    Assessment   Medical Diagnosis --  BPPV   Onset Date --  Mid February 2016   Prior Therapy --  Acute therapy   Precautions   Precautions Fall   Balance Screen   Has the patient fallen in the past 6 months No   Has the patient had a decrease in activity level because of a fear of falling?  No   Is the patient reluctant to leave their home because of a fear of falling?  Yes  "not getting out a lot because of my balance problem"   Home Environment   Living Enviornment Private residence   [place]  son   Type of [place] to enter   Entrance Stairs-Number of Steps --  [place] One level   [place] - 2 wheels;[place] - single point   Additional Comments Pt changes between devices basedon her balance   Prior Function   Level of Independence Independent with basic ADLs;Independent with gait   Observation/Other Assessments   Focus on Therapeutic Outcomes (FOTO)  --  Functional status score=40%   Other Surveys  --  DHI=70% indicating severe impairment   Bed Mobility   Bed Mobility --  Independent with sit<>supine and rolling  Ambulation/Gait   Ambulation/Gait Yes   Ambulation/Gait Assistance 5: Supervision   Ambulation Distance (Feet) 150 Feet   Gait Pattern Decreased stride length  narrow base of support, veering from midline   Gait velocity 2.10 ft/sec   Stairs Yes   Stairs Assistance 6: Modified independent (Device/Increase time)   Stair Management Technique Step to pattern;One rail Right   Number of Stairs --  4   Standardized Balance Assessment   Standardized Balance Assessment Berg Balance Test   Berg Balance Test   Sit to Stand Able to stand  independently using hands   Standing Unsupported Able to stand 2 minutes with supervision   Sitting with Back Unsupported but Feet Supported on Floor or Stool Able to sit safely and securely 2 minutes   Stand to Sit Controls descent by using hands   Transfers Able to transfer  safely, definite need of hands   Standing Unsupported with Eyes Closed Able to stand 10 seconds with supervision   Standing Ubsupported with Feet Together Able to place feet together independently and stand for 1 minute with supervision   From Standing, Reach Forward with Outstretched Arm Can reach forward >12 cm safely (5")   From Standing Position, Pick up Object from Floor Able to pick up shoe, needs supervision   From Standing Position, Turn to Look Behind Over each Shoulder Turn sideways only but maintains balance   Turn 360 Degrees Able to turn 360 degrees safely but slowly   Standing Unsupported, Alternately Place Feet on Step/Stool Able to complete >2 steps/needs minimal assist   Standing Unsupported, One Foot in Front Able to plae foot ahead of the other independently and hold 30 seconds   Standing on One Leg Unable to try or needs assist to prevent fall  due to pain   Total Score 36            Vestibular Assessment - 09/03/14 1036    Vestibular Assessment   General Observation --  Pt holds the walls entering into clinic   Symptom Behavior   Type of Dizziness Blurred vision  "hard to describe" because of vision, occasional room moves   Frequency of Dizziness --  "today is a bad day", does improve some   Aggravating Factors No known aggravating factors  can be constant and worse with movement   Relieving Factors No known relieving factors   Occulomotor Exam   Occulomotor Alignment Normal  *eyeglasses are bent, PT rec she have eye clinic fix   Spontaneous Absent   Gaze-induced Absent   Smooth Pursuits Intact   Vestibulo-Occular Reflex   VOR 1 Head Only (x 1 viewing) --  slow pace WNLs   Positional Testing   Dix-Hallpike Dix-Hallpike Right;Dix-Hallpike Left   Sidelying Test Sidelying Right;Sidelying Left   Horizontal Canal Testing Horizontal Canal Right;Horizontal Canal Left   Sidelying Right   Sidelying Right Symptoms No nystagmus  "a little dizziness"    Sidelying Left   Sidelying Left Symptoms No nystagmus   Horizontal Canal Right   Horizontal Canal Right Symptoms Normal   Horizontal Canal Left   Horizontal Canal Left Symptoms Normal                     PT Long Term Goals - 09/03/14 1357    PT LONG TERM GOAL #1   Title The patient will return demo HEP for balance and mobility.  Target date 10/04/2014   Time 4   Period Weeks   PT LONG  TERM GOAL #2   Title The patient will verbalize 3 strategies to manage low back pain at home.  Target date 10/04/2014   Time 4   Period Weeks   PT LONG TERM GOAL #3   Title *Further goals to follow, as patient allows.  At this time, she requests 1-2 visits only.  Berg goal, gait speed goal to follow if more visits scheduled.               Plan - October 01, 2014 1041    Clinical Impression Statement The patient has multi-factorial dizziness and imbalance.  She reports visual blurring and had recent eye exam (worsening macular degeneration per report), she notes back pain worsening with radiating symptoms down the left leg.  She notes onset of dizziness that she describes further as general imbalance and visual blurriness.  PT to address mobility deficits and treat vestibular as indicated.   Pt will benefit from skilled therapeutic intervention in order to improve on the following deficits Abnormal gait;Decreased activity tolerance;Decreased balance;Difficulty walking;Postural dysfunction;Pain;Decreased mobility;Decreased strength   Rehab Potential Good   PT Frequency 1x / week   PT Duration 4 weeks   PT Treatment/Interventions Therapeutic activities;Patient/family education;Therapeutic exercise;Balance training;Gait training;Manual techniques;Neuromuscular re-education;Functional mobility training   PT Next Visit Plan Establish HEP for balance (within tolerance due to pain) and mobility, reassess for vertigo if indicated, multi-sensory training to tolerance.   Recommended Other Services " I don't  know if I can do enough (because of back pain) to warrant me coming here".  PT and patient discussed establishing a HEP and then holding until after MD appt with specialist for back pain.   Consulted and Agree with Plan of Care Patient          G-Codes - Oct 01, 2014 1400    Functional Assessment Tool Used Berg=36/56, gait speed 2.10 ft/sec   Functional Limitation Self care   Mobility: Walking and Moving Around Current Status ([id number]) At least 20 percent but less than 40 percent impaired, limited or restricted   Mobility: Walking and Moving Around Goal Status [id number]) At least 1 percent but less than 20 percent impaired, limited or restricted       Problem List Patient Active Problem List   Diagnosis Date Noted  . Macular degeneration 09/01/2014  . Pneumonia 08/27/2014  . Benign paroxysmal positional vertigo   . Hypertension 08/24/2014  . Hypertensive urgency 08/24/2014  . Back pain 08/03/2014  . Complaints of total body pain 08/03/2014  . Paroxysmal atrial fibrillation 08/03/2014  . Sleep disturbance 08/03/2014  . Aortic stenosis 03/05/2014  . PAF (paroxysmal atrial fibrillation) 01/21/2014  . Long term current use of anticoagulant therapy 01/21/2014  . CHF (congestive heart failure) 01/21/2014  . HTN (hypertension) 01/21/2014  . Chest pain 01/21/2014    Thank you for the referral of this patient.   [place], [place] 01-Oct-2014, 2:01 PM  [facility] [address] [place], [place], [address] Phone: [phone]   Fax:  [phone]

## 2014-09-04 ENCOUNTER — Other Ambulatory Visit: Payer: Self-pay | Admitting: Family

## 2014-09-04 MED ORDER — TEMAZEPAM 15 MG PO CAPS: 15.0000 mg | ORAL_CAPSULE | Freq: Every day | ORAL | Status: DC

## 2014-09-04 NOTE — Telephone Encounter (Signed)
Rx faxed

## 2014-09-04 NOTE — Telephone Encounter (Signed)
Ok to refill 

## 2014-09-07 ENCOUNTER — Ambulatory Visit: Payer: Medicare Other | Admitting: Physical Therapy

## 2014-09-07 ENCOUNTER — Encounter: Payer: Self-pay | Admitting: Physical Therapy

## 2014-09-07 DIAGNOSIS — I509 Heart failure, unspecified: Secondary | ICD-10-CM | POA: Diagnosis not present

## 2014-09-07 DIAGNOSIS — K219 Gastro-esophageal reflux disease without esophagitis: Secondary | ICD-10-CM | POA: Diagnosis not present

## 2014-09-07 DIAGNOSIS — R269 Unspecified abnormalities of gait and mobility: Secondary | ICD-10-CM

## 2014-09-07 DIAGNOSIS — I10 Essential (primary) hypertension: Secondary | ICD-10-CM | POA: Diagnosis not present

## 2014-09-07 DIAGNOSIS — I4891 Unspecified atrial fibrillation: Secondary | ICD-10-CM | POA: Diagnosis not present

## 2014-09-07 DIAGNOSIS — H8110 Benign paroxysmal vertigo, unspecified ear: Secondary | ICD-10-CM | POA: Diagnosis not present

## 2014-09-07 NOTE — Therapy (Signed)
[facility] [address] [place], [place], [address] Phone: [phone]   Fax:  [phone]  Physical Therapy Treatment  Patient Details  Name: [name] MRN: [id number] Date of Birth: Nov 05, 1940 Referring Provider:  [place], [name]  Encounter Date: 09/07/2014      PT End of Session - 09/07/14 1241    Visit Number 2   Number of Visits 4   Date for PT Re-Evaluation 10/05/14   PT Start Time 6962   PT Stop Time 1313   PT Time Calculation (min) 38 min   Equipment Utilized During Treatment Gait belt   Activity Tolerance Patient tolerated treatment well   Behavior During Therapy [facility] for tasks assessed/performed      Past Medical History  Diagnosis Date  . CHF (congestive heart failure)   . Hypertension   . Anemia   . Broken back 2013  . Atrial fibrillation   . GERD (gastroesophageal reflux disease)   . Arthritis     Past Surgical History  Procedure Laterality Date  . Lumbar disc surgery  1991  . Lumbar back surgery  2012  . Cervical disc surgery  2014  . Cholecystectomy  1980s  . Appendectomy  1950s  . Tonsillectomy and adenoidectomy  1960s  . Carpal tunnel release Bilateral 1990s    There were no vitals taken for this visit.  Visit Diagnosis:  Abnormality of gait      Subjective Assessment - 09/07/14 1239    Symptoms No new complants. Does report her back is hurting today with radiating pain down her left leg.   Currently in Pain? Yes   Pain Score 7    Pain Location Back   Pain Orientation Left   Pain Descriptors / Indicators Numbness;Tightness;Burning   Pain Type Chronic pain   Pain Radiating Towards down her left leg   Pain Onset More than a month ago   Pain Frequency Constant   Aggravating Factors  activity    Pain Relieving Factors nothing at this time     Treatment Neuro Re-ed In corner on pillow - eyes closed hold steady 15 sec x 3 reps - eyes closed head movements  up/down, left/right and diagonals both ways x 10 each  At counter - toe walk forward/backward x 3 laps each way - tandem gait forward/backwards x 3 laps each way - figure 8 x 2 laps - forward/backward gait with head turns left/right x 3 laps each way - attempted heel walking forward, pt unable due to increased pain in left leg/low back.   Above issued in HEP.        PT Education - 09/07/14 1426    Education provided Yes   Education Details Balance HEP: corner ex's on pillow and at counter top.    Person(s) Educated Patient   Methods Explanation;Demonstration;Handout   Comprehension Verbalized understanding;Need further instruction           PT Long Term Goals - 09/03/14 1357    PT LONG TERM GOAL #1   Title The patient will return demo HEP for balance and mobility.  Target date 10/04/2014   Time 4   Period Weeks   PT LONG TERM GOAL #2   Title The patient will verbalize 3 strategies to manage low back pain at home.  Target date 10/04/2014   Time 4   Period Weeks   PT LONG TERM GOAL #3   Title *Further goals to follow, as patient allows.  At  this time, she requests 1-2 visits only.  Berg goal, gait speed goal to follow if more visits scheduled.           Plan - 09/07/14 1241    Clinical Impression Statement Issued HEP for balance today. Pt did report increase in back pain with them, to 8/10 from 7/10. Unable to do heel walk due to increased pain, therefore stopped that activity and did not send that one home. Pt to see eye doctor this Thursday.   Pt will benefit from skilled therapeutic intervention in order to improve on the following deficits Abnormal gait;Decreased activity tolerance;Decreased balance;Difficulty walking;Postural dysfunction;Pain;Decreased mobility;Decreased strength   Rehab Potential Good   PT Frequency 1x / week   PT Duration 4 weeks   PT Treatment/Interventions Therapeutic activities;Patient/family education;Therapeutic exercise;Balance training;Gait  training;Manual techniques;Neuromuscular re-education;Functional mobility training   PT Next Visit Plan  balance (within tolerance due to pain) and mobility, reassess for vertigo if indicated, multi-sensory training to tolerance.   Consulted and Agree with Plan of Care Patient      Problem List Patient Active Problem List   Diagnosis Date Noted  . Macular degeneration 09/01/2014  . Pneumonia 08/27/2014  . Benign paroxysmal positional vertigo   . Hypertension 08/24/2014  . Hypertensive urgency 08/24/2014  . Back pain 08/03/2014  . Complaints of total body pain 08/03/2014  . Paroxysmal atrial fibrillation 08/03/2014  . Sleep disturbance 08/03/2014  . Aortic stenosis 03/05/2014  . PAF (paroxysmal atrial fibrillation) 01/21/2014  . Long term current use of anticoagulant therapy 01/21/2014  . CHF (congestive heart failure) 01/21/2014  . HTN (hypertension) 01/21/2014  . Chest pain 01/21/2014    Willow Ora 09/07/2014, 2:29 PM  Willow Ora, PTA, [facility] [address] [place], [place] [address] [phone] 09/07/2014, 2:29 PM

## 2014-09-07 NOTE — Patient Instructions (Signed)
Feet Together (Compliant Surface) Varied Arm Positions - Eyes Closed   Stand on compliant surface: _pillow__ with feet together and arms out. Close eyes and visualize upright position. Hold__15__ seconds. Repeat _3-5__ times per session. Do _1-2___ sessions per day.  Copyright  VHI. All rights reserved.  Feet Together (Compliant Surface) Head Motion - Eyes Closed   Stand on compliant surface: __pillow__ with feet together. Close eyes and move head slowly: 1. up and down. 2. Left and right 3. Diagonals both ways  Repeat _10_ times per session. Do _1-2_ sessions per day.  Copyright  VHI. All rights reserved.  Walking on Toes   At counter: walk on toes forward and then backwards while continuing on a straight path. Repeat for 3 laps each way. Do _1-2___ sessions per day.  Copyright  VHI. All rights reserved.  Feet Heel-Toe "Tandem"   At counter with arms as needed, walk a straight line bringing one foot directly in front of the other and then walk a straight line backwards with one foot directly behind the other. Repeat for _3 laps each way. Do _1-2___ sessions per day.  Copyright  VHI. All rights reserved.  Side to Side Head Motion   At counter top: walking forward with head turns side to side and then walking backwards with head turns side to side. Repeat sequence _3 laps each way. Do _1-2__ sessions per day.  Copyright  VHI. All rights reserved.  Figure Eight   Walk in a figure eight pattern. You can use 2 cups to help you with the pattern. Repeat __3__ times per session. Do __1-2__ sessions per day.  Copyright  VHI. All rights reserved.

## 2014-09-10 ENCOUNTER — Ambulatory Visit (INDEPENDENT_AMBULATORY_CARE_PROVIDER_SITE_OTHER): Payer: Medicare Other | Admitting: Family

## 2014-09-10 ENCOUNTER — Encounter: Payer: Self-pay | Admitting: Family

## 2014-09-10 ENCOUNTER — Telehealth: Payer: Self-pay | Admitting: Family

## 2014-09-10 ENCOUNTER — Other Ambulatory Visit (INDEPENDENT_AMBULATORY_CARE_PROVIDER_SITE_OTHER): Payer: 59

## 2014-09-10 ENCOUNTER — Other Ambulatory Visit: Payer: Self-pay | Admitting: Family

## 2014-09-10 ENCOUNTER — Encounter (INDEPENDENT_AMBULATORY_CARE_PROVIDER_SITE_OTHER): Payer: Medicare Other | Admitting: Ophthalmology

## 2014-09-10 VITALS — BP 202/70 | HR 56 | Temp 97.3°F | Resp 18 | Wt 129.0 lb

## 2014-09-10 DIAGNOSIS — H43813 Vitreous degeneration, bilateral: Secondary | ICD-10-CM

## 2014-09-10 DIAGNOSIS — I10 Essential (primary) hypertension: Secondary | ICD-10-CM | POA: Diagnosis not present

## 2014-09-10 DIAGNOSIS — Z5181 Encounter for therapeutic drug level monitoring: Secondary | ICD-10-CM

## 2014-09-10 DIAGNOSIS — M545 Low back pain: Secondary | ICD-10-CM | POA: Diagnosis not present

## 2014-09-10 DIAGNOSIS — H3531 Nonexudative age-related macular degeneration: Secondary | ICD-10-CM | POA: Diagnosis not present

## 2014-09-10 DIAGNOSIS — H2513 Age-related nuclear cataract, bilateral: Secondary | ICD-10-CM | POA: Diagnosis not present

## 2014-09-10 DIAGNOSIS — H35033 Hypertensive retinopathy, bilateral: Secondary | ICD-10-CM | POA: Diagnosis not present

## 2014-09-10 LAB — PROTIME-INR
INR: 3.4 ratio — ABNORMAL HIGH (ref 0.8–1.0)
PROTHROMBIN TIME: 36.7 s — AB (ref 9.6–13.1)

## 2014-09-10 NOTE — Patient Instructions (Signed)
Thank you for choosing [facility].  Summary/Instructions:  Please continue to take your blood pressure medications and contact Dr. Lysbeth Penner office to follow up from a cardiology perspective.   If your symptoms worsen or fail to improve, please contact our office for further instruction, or in case of emergency go directly to the emergency room at the closest medical facility.

## 2014-09-10 NOTE — Progress Notes (Signed)
Pre visit review using our clinic review tool, if applicable. No additional management support is needed unless otherwise documented below in the visit note. 

## 2014-09-10 NOTE — Telephone Encounter (Signed)
Pt request lab order for INR check while she is here. Please help

## 2014-09-10 NOTE — Progress Notes (Signed)
Subjective:    Patient ID: [name], female    DOB: 12/24/1940, 74 y.o.   MRN: [id number]  Chief Complaint  Patient presents with  . Follow-up    Left hip and leg pain, still having hypertension    HPI:  [name] is a 74 y.o. female who presents today for follow up.  1) Hip pain - Indicates she continues to experience hip/back pain which is not relieved by the medrol dose pack and remains tolerable with the oxycodone. She is awaiting an appointment with Dr. Ron Agee for further management.   2) Hypertension - Continues to experience hypertension. Has recently been seen by the opthamologist and was told she has a blood spot in her eye. Indicates that she continues to take her medications as prescribed.   Allergies  Allergen Reactions  . Iodinated Diagnostic Agents Anaphylaxis    Info given by patient  . Milk-Related Compounds     Lactose intolerance  . Darvon [Propoxyphene] Rash  . Hydralazine Anxiety    Facial flushing, pt prefers not to use it.     Current Outpatient Prescriptions on File Prior to Visit  Medication Sig Dispense Refill  . amLODipine (NORVASC) 10 MG tablet Take 1 tablet (10 mg total) by mouth daily. 90 tablet 3  . amoxicillin-clavulanate (AUGMENTIN) 875-125 MG per tablet Take 1 tablet by mouth every 12 (twelve) hours. For 6 more days 12 tablet 0  . butalbital-acetaminophen-caffeine (FIORICET, ESGIC) 50-325-40 MG per tablet Take 1 tablet by mouth every 4 (four) hours as needed for headache. 14 tablet 0  . cloNIDine (CATAPRES) 0.1 MG tablet Take 1 tablet (0.1 mg total) by mouth daily. 30 tablet 0  . furosemide (LASIX) 40 MG tablet Take 1-2 tablets (40-80 mg total) by mouth daily. 30 tablet 1  . gabapentin (NEURONTIN) 100 MG capsule Take 1 capsule (100 mg total) by mouth 3 (three) times daily. 90 capsule 0  . lisinopril (PRINIVIL,ZESTRIL) 40 MG tablet Take 1 tablet (40 mg total) by mouth daily. 90 tablet 3  . meclizine (ANTIVERT) 25 MG tablet Take 1  tablet (25 mg total) by mouth 3 (three) times daily as needed for dizziness. 30 tablet 0  . methylPREDNIsolone (MEDROL DOSPACK) 4 MG tablet follow package directions 21 tablet 0  . metoCLOPramide (REGLAN) 10 MG tablet Take 10 mg by mouth every 6 (six) hours as needed for nausea.    . metoprolol (LOPRESSOR) 25 MG tablet Take 1 tablet (25 mg total) by mouth 2 (two) times daily. 60 tablet 0  . oxycodone (OXY-IR) 5 MG capsule Take 1 capsule (5 mg total) by mouth 2 (two) times daily as needed for pain. 60 capsule 0  . pantoprazole (PROTONIX) 40 MG tablet Take 1 tablet (40 mg total) by mouth daily. 90 tablet 3  . polyethylene glycol (MIRALAX / GLYCOLAX) packet Take 17 g by mouth daily as needed for moderate constipation. 14 each 0  . simvastatin (ZOCOR) 20 MG tablet Take 1 tablet (20 mg total) by mouth daily. 90 tablet 3  . temazepam (RESTORIL) 15 MG capsule Take 1 capsule (15 mg total) by mouth at bedtime. 30 capsule 0  . tizanidine (ZANAFLEX) 2 MG capsule Take 2 mg by mouth every 6 (six) hours as needed for muscle spasms.    . traMADol (ULTRAM) 50 MG tablet Take by mouth every 6 (six) hours as needed.    . vitamin B-12 (CYANOCOBALAMIN) 1000 MCG tablet Take 1,000 mcg by mouth daily.    [name]  warfarin (COUMADIN) 5 MG tablet Take 1 tablet (5 mg total) by mouth at bedtime. As directed per INR. Please do not take on 2/26 and 2/27. Resume on 08/30/14. INR check on Monday or Tuesday.     No current facility-administered medications on file prior to visit.    Review of Systems  Respiratory: Negative for chest tightness.   Cardiovascular: Negative for chest pain, palpitations and leg swelling.  Musculoskeletal: Positive for back pain.      Objective:    BP 202/70 mmHg  Pulse 56  Temp(Src) 97.3 F (36.3 C) (Oral)  Resp 18  Wt 129 lb (58.514 kg)  SpO2 97% Nursing note and vital signs reviewed.  Physical Exam  Constitutional: She is oriented to person, place, and time. She appears well-developed and  well-nourished. No distress.  Cardiovascular: Normal rate, regular rhythm, normal heart sounds and intact distal pulses.   Pulmonary/Chest: Effort normal and breath sounds normal.  Neurological: She is alert and oriented to person, place, and time.  Skin: Skin is warm and dry.  Psychiatric: She has a normal mood and affect. Her behavior is normal. Judgment and thought content normal.       Assessment & Plan:

## 2014-09-10 NOTE — Assessment & Plan Note (Addendum)
Hypertension continues to be refractory to medication. Cannot rule out pain as a potential cause of the blood pressure increase. Continue current amlodipine, clonidine, lisinopril, metoprolol, and furosemide at current dosage. Follow up after visit with cardiology. Patient instructed to go to ED if blood pressure increases or symptoms of worst headache of her life or additional vision changes develop.

## 2014-09-10 NOTE — Telephone Encounter (Signed)
Please inform patient to take half a tablet of warfarin tomorrow and then resume 5 mg daily.

## 2014-09-10 NOTE — Assessment & Plan Note (Signed)
Continue previously prescribed medications. Awaiting appointment with Dr. Ron Agee.

## 2014-09-11 NOTE — Telephone Encounter (Signed)
LVM for pt to call back.

## 2014-09-14 ENCOUNTER — Encounter: Payer: Self-pay | Admitting: Internal Medicine

## 2014-09-14 ENCOUNTER — Ambulatory Visit (INDEPENDENT_AMBULATORY_CARE_PROVIDER_SITE_OTHER): Payer: 59 | Admitting: Internal Medicine

## 2014-09-14 VITALS — BP 176/72 | Ht 62.0 in | Wt 130.8 lb

## 2014-09-14 DIAGNOSIS — I480 Paroxysmal atrial fibrillation: Secondary | ICD-10-CM

## 2014-09-14 DIAGNOSIS — I10 Essential (primary) hypertension: Secondary | ICD-10-CM

## 2014-09-14 DIAGNOSIS — I5022 Chronic systolic (congestive) heart failure: Secondary | ICD-10-CM

## 2014-09-14 DIAGNOSIS — R0789 Other chest pain: Secondary | ICD-10-CM

## 2014-09-14 DIAGNOSIS — Z7901 Long term (current) use of anticoagulants: Secondary | ICD-10-CM

## 2014-09-14 MED ORDER — VALSARTAN-HYDROCHLOROTHIAZIDE 320-25 MG PO TABS: 1.0000 | ORAL_TABLET | Freq: Every day | ORAL | Status: DC

## 2014-09-14 NOTE — Patient Instructions (Signed)
Your physician has recommended you make the following change in your medication: start new prescription for valsartan-hct. This has already been sent to your pharmacy. STOP the lisinopril.  Your physician recommends that you return for lab work in: 1 week.  Your physician recommends that you schedule a follow-up appointment in: 3 months with Dr. Debara Pickett.

## 2014-09-14 NOTE — Progress Notes (Signed)
OFFICE NOTE  Chief Complaint:  Establish new cardiologist  Primary Care Physician: Mauricio Po, [name]  HPI:  [name] is a pleasant 74 year old female kindly referred to me be Dr. Ardeth Perfect. She reports a past medical history significant for atrial fibrillation and congestive heart failure, which occurred around the same time and probably 2012. She vaguely recalls seeing Dr. Irish Lack at that time.  She was started on warfarin therapy and no attempts were made to get her back into rhythm as she was paroxysmal. She does has a long-standing history of hypertension and dyslipidemia. She apparently developed heart failure or however has never had reassessment of her ejection fraction. She subsequently was having health issues and decided to move with her son to live in [place]. She says at that time she had some problems with chest pain and ultimately underwent a stress test. This was associated with an anaphylactic type reaction and despite their reassurances she will "no longer have any more stress test". She says she has a severe allergy to IV contrast dye which causes throat swelling as well as milk which also causes the same symptoms. Apparently she also underwent cardiac catheterization however she says that only medical therapy was recommended. This was in [place], [place].  We are trying to request records from there as well. She subsequently moved with her son to [facility], however he lost his job and she recently moved back to [place]. Currently she denies any significant shortness of breath but does report some mild leg edema which is slightly worse and occasional pain in her left chest and left arm over the last several days. She's also had pain in the right chest. None of those symptoms are worse with exertion or relieved by rest or medication.   [name] returns today for followup. She again appears upset and quite nervous. Blood pressure recently has been significantly  elevated. In fact she went to the emergency room and was hospitalized for blood pressure control. Although it seems that her blood pressure more easily was controlled once her oral medicines were restarted. Subsequently she's been started on clonidine is now up to twice a day. Blood pressure today was still high at 315 systolic. She reports some occasional chest discomfort, mostly at rest and reports not being active enough to know if she has symptoms with exertion. She also has pain all over body significant low back pain and is seeing a specialist about that in the next few weeks.  PMHx:  Past Medical History  Diagnosis Date  . CHF (congestive heart failure)   . Hypertension   . Anemia   . Broken back 2013  . Atrial fibrillation   . GERD (gastroesophageal reflux disease)   . Arthritis     Past Surgical History  Procedure Laterality Date  . Lumbar disc surgery  1991  . Lumbar back surgery  2012  . Cervical disc surgery  2014  . Cholecystectomy  1980s  . Appendectomy  1950s  . Tonsillectomy and adenoidectomy  1960s  . Carpal tunnel release Bilateral 1990s    FAMHx:  Family History  Problem Relation Age of Onset  . Stroke Mother   . Heart disease Mother   . Emphysema Father   . Ovarian cancer Sister   . Stroke Sister   . Other Child     died at birth    SOCHx:   reports that she has never smoked. She has never used smokeless tobacco. She reports that she  does not drink alcohol or use illicit drugs.  ALLERGIES:  Allergies  Allergen Reactions  . Iodinated Diagnostic Agents Anaphylaxis    Info given by patient  . Milk-Related Compounds     Lactose intolerance  . Darvon [Propoxyphene] Rash  . Hydralazine Anxiety    Facial flushing, pt prefers not to use it.     ROS: A comprehensive review of systems was negative except for: Respiratory: positive for dyspnea on exertion Cardiovascular: positive for chest pressure/discomfort and lower extremity edema Behavioral/Psych:  positive for anxiety  HOME MEDS: Current Outpatient Prescriptions  Medication Sig Dispense Refill  . amLODipine (NORVASC) 10 MG tablet Take 1 tablet (10 mg total) by mouth daily. 90 tablet 3  . butalbital-acetaminophen-caffeine (FIORICET, ESGIC) 50-325-40 MG per tablet Take 1 tablet by mouth every 4 (four) hours as needed for headache. 14 tablet 0  . cloNIDine (CATAPRES) 0.1 MG tablet Take 1 tablet (0.1 mg total) by mouth daily. 30 tablet 0  . furosemide (LASIX) 40 MG tablet Take 1-2 tablets (40-80 mg total) by mouth daily. 30 tablet 1  . gabapentin (NEURONTIN) 100 MG capsule Take 1 capsule (100 mg total) by mouth 3 (three) times daily. 90 capsule 0  . meclizine (ANTIVERT) 25 MG tablet Take 1 tablet (25 mg total) by mouth 3 (three) times daily as needed for dizziness. 30 tablet 0  . methylPREDNIsolone (MEDROL DOSPACK) 4 MG tablet follow package directions 21 tablet 0  . metoCLOPramide (REGLAN) 10 MG tablet Take 10 mg by mouth every 6 (six) hours as needed for nausea.    . metoprolol (LOPRESSOR) 25 MG tablet Take 1 tablet (25 mg total) by mouth 2 (two) times daily. 60 tablet 0  . oxycodone (OXY-IR) 5 MG capsule Take 1 capsule (5 mg total) by mouth 2 (two) times daily as needed for pain. 60 capsule 0  . pantoprazole (PROTONIX) 40 MG tablet Take 1 tablet (40 mg total) by mouth daily. 90 tablet 3  . polyethylene glycol (MIRALAX / GLYCOLAX) packet Take 17 g by mouth daily as needed for moderate constipation. 14 each 0  . simvastatin (ZOCOR) 20 MG tablet Take 1 tablet (20 mg total) by mouth daily. 90 tablet 3  . temazepam (RESTORIL) 15 MG capsule Take 1 capsule (15 mg total) by mouth at bedtime. 30 capsule 0  . tizanidine (ZANAFLEX) 2 MG capsule Take 2 mg by mouth every 6 (six) hours as needed for muscle spasms.    . traMADol (ULTRAM) 50 MG tablet Take by mouth every 6 (six) hours as needed.    . vitamin B-12 (CYANOCOBALAMIN) 1000 MCG tablet Take 1,000 mcg by mouth daily.    [name] warfarin (COUMADIN) 5  MG tablet Take 1 tablet (5 mg total) by mouth at bedtime. As directed per INR. Please do not take on 2/26 and 2/27. Resume on 08/30/14. INR check on Monday or Tuesday.    [name] atorvastatin (LIPITOR) 20 MG tablet Take 20 mg by mouth at bedtime.  5  . valsartan-hydrochlorothiazide (DIOVAN-HCT) 320-25 MG per tablet Take 1 tablet by mouth daily. 30 tablet 6   No current facility-administered medications for this visit.    LABS/IMAGING: No results found for this or any previous visit (from the past 48 hour(s)). No results found.  VITALS: BP 176/72 mmHg  Ht 5\' 2"  (1.575 m)  Wt 130 lb 12.8 oz (59.33 kg)  BMI 23.92 kg/m2  EXAM: General appearance: alert and no distress Neck: no carotid bruit and no JVD Lungs: clear to auscultation  bilaterally Heart: regular rate and rhythm, S1, S2 normal, no murmur, click, rub or gallop Abdomen: soft, non-tender; bowel sounds normal; no masses,  no organomegaly Extremities: extremities normal, atraumatic, no cyanosis or edema Pulses: 2+ and symmetric Skin: Skin color, texture, turgor normal. No rashes or lesions Neurologic: Grossly normal  EKG: Sinus bradycardia 58  ASSESSMENT: 1. Paroxysmal atrial fibrillation on warfarin 2. History of "congestive heart failure" - EF 60-65% on echo 3. Hypertension - uncontrolled 4. Dyslipidemia 5. Mild to moderate aortic stenosis  PLAN: 1.   Mrs. Bacus is maintaining sinus rhythm. She is on warfarin anticoagulation. She does have mild to moderate aortic stenosis. Recently she's had problems with uncontrolled hypertension. She was in the hospital and was started on additional medication. Her blood pressure still not at goal. It's unclear what is leading to this, although pain may be part of it. Can't exclude any underlying coronary disease either. We discussed stress testing, but she is hesitant to do that because she "had anaphylactic reaction" to her prior stress test. The other option to evaluate her coronaries would  be catheterization. She's not too keen on that either. For now, I would recommend discontinuing her lisinopril starting her on a more potent blood pressure lowering medicine, namely Diovan HCTZ 320/25 mg daily. We'll go ahead and recheck a BMP in a week. If her blood pressure remains uncontrolled or she continues to have of fatigue or new chest pain symptoms which are concerning for angina, cardiac catheterization may be necessary.   She tells me she is seeing her primary care provider next week and that should be sufficient for blood pressure follow-up.  Pixie Casino, MD, [facility] Attending Cardiologist [facility]  [place] C 09/14/2014, 12:41 PM

## 2014-09-17 ENCOUNTER — Telehealth: Payer: Self-pay

## 2014-09-17 NOTE — Telephone Encounter (Signed)
Left message for pt to call back to schedule if she still wants flu vaccine

## 2014-09-21 ENCOUNTER — Ambulatory Visit: Payer: Medicare Other | Admitting: Rehabilitative and Restorative Service Providers"

## 2014-09-21 DIAGNOSIS — I509 Heart failure, unspecified: Secondary | ICD-10-CM | POA: Diagnosis not present

## 2014-09-21 DIAGNOSIS — I10 Essential (primary) hypertension: Secondary | ICD-10-CM | POA: Diagnosis not present

## 2014-09-21 DIAGNOSIS — H8110 Benign paroxysmal vertigo, unspecified ear: Secondary | ICD-10-CM | POA: Diagnosis not present

## 2014-09-21 DIAGNOSIS — R269 Unspecified abnormalities of gait and mobility: Secondary | ICD-10-CM | POA: Diagnosis not present

## 2014-09-21 DIAGNOSIS — I4891 Unspecified atrial fibrillation: Secondary | ICD-10-CM | POA: Diagnosis not present

## 2014-09-21 DIAGNOSIS — K219 Gastro-esophageal reflux disease without esophagitis: Secondary | ICD-10-CM | POA: Diagnosis not present

## 2014-09-21 NOTE — Therapy (Signed)
[facility] [address] [place], [place], [address] Phone: [phone]   Fax:  [phone]  Physical Therapy Treatment  Patient Details  Name: [name] MRN: [id number] Date of Birth: Oct 26, 1940 Referring Provider:  Golden Circle, [name]  Encounter Date: 09/21/2014      PT End of Session - 09/21/14 1051    Visit Number 3   Number of Visits 4   Date for PT Re-Evaluation 10/05/14   PT Start Time 1020   PT Stop Time 1050   PT Time Calculation (min) 30 min   Activity Tolerance Patient tolerated treatment well   Behavior During Therapy [facility] for tasks assessed/performed      Past Medical History  Diagnosis Date  . CHF (congestive heart failure)   . Hypertension   . Anemia   . Broken back 2013  . Atrial fibrillation   . GERD (gastroesophageal reflux disease)   . Arthritis     Past Surgical History  Procedure Laterality Date  . Lumbar disc surgery  1991  . Lumbar back surgery  2012  . Cervical disc surgery  2014  . Cholecystectomy  1980s  . Appendectomy  1950s  . Tonsillectomy and adenoidectomy  1960s  . Carpal tunnel release Bilateral 1990s    There were no vitals filed for this visit.  Visit Diagnosis:  Abnormality of gait      Subjective Assessment - 09/21/14 1021    Symptoms The patient reports she was seen at eye doctor and reports macular degeneration is "not good".  She also has cateracts, but she is not sure surgery would provide her relief.   Currently in Pain? Yes   Pain Score 9    Pain Location Back   Pain Orientation Left   Pain Descriptors / Indicators Burning;Numbness;Tightness   Pain Type Chronic pain   Pain Radiating Towards down the left leg   Pain Onset More than a month ago   Pain Frequency Constant   Aggravating Factors  activity; standing      THERAPEUTIC EXERCISE: Supine hip ab/adduction in hooklying x 5 reps each side,  Attempted core stabilization with low back in  neutral position x 5 reps with increased pain Attempted towel folded under low back to increase ROM into anterior pelvic tilt, but too painful to tolerate Attempted supine LE marching with core stabilization with pain   SELF CARE/HOME MANAGEMENT: The patient has not been able to tolerate standing balance exercises due to continued low back pain limiting mobility.   Seated positioning with towel roll discussed Demonstrated standing at sink changing foot positions for pain management Discussed progressing walking to tolerance due to overall declining mobility due to back pain Patient and PT discussed PT and patient requests to hold due to continued pain and intolerance to activities recommended.         PT Education - 09/21/14 1050    Education provided Yes   Education Details Discussed home walking to tolerance, standing positions in the kitchen for back pain management, sitting with lumbar support demonstrated.   Person(s) Educated Patient   Methods Explanation   Comprehension Verbalized understanding             PT Long Term Goals - 09/21/14 1054    PT LONG TERM GOAL #1   Title The patient will return demo HEP for balance and mobility.  Target date 10/04/2014   Baseline Pt has HEP, however is not tolerating due to pain.  Time 4   Period Weeks   Status Achieved   PT LONG TERM GOAL #2   Title The patient will verbalize 3 strategies to manage low back pain at home.  Target date 10/04/2014   Baseline Nothing improves pain at home.   Time 4   Period Weeks   Status Not Met   PT LONG TERM GOAL #3   Title *Further goals to follow, as patient allows.  At this time, she requests 1-2 visits only.  Berg goal, gait speed goal to follow if more visits scheduled.   Status Deferred               Plan - 09/21/14 1054    Clinical Impression Statement The patient is not tolerating HEP due to back pain.  PT attempted to begin basic core stabilization with back in neutral  position, however she is unable to tolerate.  PT educated patient on  need for continued mobility in tolerable range to prevent further decline in mobility.   PT Next Visit Plan Pt requested to hold further visits at this time due to not tolerating.   Consulted and Agree with Plan of Care Patient        Problem List Patient Active Problem List   Diagnosis Date Noted  . Macular degeneration 09/01/2014  . Pneumonia 08/27/2014  . Benign paroxysmal positional vertigo   . Hypertension 08/24/2014  . Hypertensive urgency 08/24/2014  . Back pain 08/03/2014  . Complaints of total body pain 08/03/2014  . Paroxysmal atrial fibrillation 08/03/2014  . Sleep disturbance 08/03/2014  . Aortic stenosis 03/05/2014  . PAF (paroxysmal atrial fibrillation) 01/21/2014  . Long term current use of anticoagulant therapy 01/21/2014  . CHF (congestive heart failure) 01/21/2014  . HTN (hypertension) 01/21/2014  . Chest pain 01/21/2014    Adan Beal, PT 09/21/2014, 10:56 AM  [facility] [address] [place], [place], [address] Phone: [phone]   Fax:  [phone]

## 2014-09-29 ENCOUNTER — Other Ambulatory Visit: Payer: Self-pay | Admitting: Family

## 2014-09-29 DIAGNOSIS — W57XXXA Bitten or stung by nonvenomous insect and other nonvenomous arthropods, initial encounter: Secondary | ICD-10-CM

## 2014-09-30 ENCOUNTER — Telehealth: Payer: Self-pay | Admitting: Family

## 2014-09-30 ENCOUNTER — Telehealth: Payer: Self-pay

## 2014-09-30 ENCOUNTER — Other Ambulatory Visit: Payer: Self-pay | Admitting: Family

## 2014-09-30 DIAGNOSIS — Z5181 Encounter for therapeutic drug level monitoring: Secondary | ICD-10-CM

## 2014-09-30 MED ORDER — DOXYCYCLINE HYCLATE 100 MG PO TABS: 100.0000 mg | ORAL_TABLET | Freq: Two times a day (BID) | ORAL | Status: DC

## 2014-09-30 NOTE — Telephone Encounter (Signed)
Left message for pt to call back for dr Elisabeth Most instructions--regarding tick bite----patient is to skip coumadin on Saturday, ck INR on Monday, and Doxycycline has been ordered and sent to patients pharmacy for tick bite ----coumadin has been readjusted because of doxycycline ordered for tick bite

## 2014-09-30 NOTE — Telephone Encounter (Signed)
Please inform patient that doxycyline has been sent to her pharmacy. Please have her check her INR on Monday and skip her coumadin dose on Saturday. Otherwise continue to take as instructed.

## 2014-10-01 NOTE — Telephone Encounter (Signed)
Pt returned call .    Best number -[phone]

## 2014-10-05 ENCOUNTER — Other Ambulatory Visit: Payer: Self-pay | Admitting: Family

## 2014-10-05 ENCOUNTER — Encounter: Payer: Medicare Other | Admitting: Rehabilitative and Restorative Service Providers"

## 2014-10-06 ENCOUNTER — Other Ambulatory Visit: Payer: Self-pay | Admitting: Family

## 2014-10-08 ENCOUNTER — Other Ambulatory Visit: Payer: Self-pay | Admitting: Family

## 2014-10-15 ENCOUNTER — Ambulatory Visit (INDEPENDENT_AMBULATORY_CARE_PROVIDER_SITE_OTHER): Payer: Medicare Other | Admitting: Family

## 2014-10-15 ENCOUNTER — Other Ambulatory Visit (INDEPENDENT_AMBULATORY_CARE_PROVIDER_SITE_OTHER): Payer: Medicare Other

## 2014-10-15 VITALS — BP 152/74 | HR 54 | Temp 97.6°F | Resp 18 | Ht 62.0 in | Wt 134.0 lb

## 2014-10-15 DIAGNOSIS — I10 Essential (primary) hypertension: Secondary | ICD-10-CM

## 2014-10-15 DIAGNOSIS — Z5181 Encounter for therapeutic drug level monitoring: Secondary | ICD-10-CM

## 2014-10-15 DIAGNOSIS — Z79891 Long term (current) use of opiate analgesic: Secondary | ICD-10-CM | POA: Diagnosis not present

## 2014-10-15 DIAGNOSIS — Z79899 Other long term (current) drug therapy: Secondary | ICD-10-CM | POA: Diagnosis not present

## 2014-10-15 DIAGNOSIS — M546 Pain in thoracic spine: Secondary | ICD-10-CM

## 2014-10-15 LAB — BASIC METABOLIC PANEL
BUN: 15 mg/dL (ref 6–23)
CO2: 32 mEq/L (ref 19–32)
Calcium: 8.9 mg/dL (ref 8.4–10.5)
Chloride: 106 mEq/L (ref 96–112)
Creatinine, Ser: 0.78 mg/dL (ref 0.40–1.20)
GFR: 76.5 mL/min (ref >60.00)
Glucose, Bld: 77 mg/dL (ref 70–99)
POTASSIUM: 4.4 meq/L (ref 3.5–5.1)
Sodium: 141 mEq/L (ref 135–145)

## 2014-10-15 LAB — PROTIME-INR
INR: 1.6 ratio — ABNORMAL HIGH (ref 0.8–1.0)
PROTHROMBIN TIME: 17.1 s — AB (ref 9.6–13.1)

## 2014-10-15 MED ORDER — TRAMADOL HCL 50 MG PO TABS: 50.0000 mg | ORAL_TABLET | Freq: Four times a day (QID) | ORAL | Status: DC | PRN

## 2014-10-15 MED ORDER — TEMAZEPAM 15 MG PO CAPS: 15.0000 mg | ORAL_CAPSULE | Freq: Every day | ORAL | Status: DC

## 2014-10-15 NOTE — Assessment & Plan Note (Signed)
Blood pressure is near goal of 150/90 with current regimen. Continue current dosages of amlodipine, valsartan-hydrochlorothiazide, and metoprolol.

## 2014-10-15 NOTE — Assessment & Plan Note (Signed)
Symptoms and exam consistent with muscle spasm. Treat conservatively at this time with heat and stretching. Follow up if symptoms worsen or fail to improve.

## 2014-10-15 NOTE — Progress Notes (Signed)
Pre visit review using our clinic review tool, if applicable. No additional management support is needed unless otherwise documented below in the visit note. 

## 2014-10-15 NOTE — Assessment & Plan Note (Signed)
Obtain INR. Continue Coumadin at current dosage. Follow-up pending lab work.

## 2014-10-15 NOTE — Patient Instructions (Signed)
Thank you for choosing [facility].  Summary/Instructions:  Your prescription(s) have been submitted to your pharmacy or been printed and provided for you. Please take as directed and contact our office if you believe you are having problem(s) with the medication(s) or have any questions.  Please stop by the lab on the basement level of the building for your blood work. Your results will be released to [place] (or called to you) after review, usually within 72 hours after test completion. If any changes need to be made, you will be notified at that same time.  If your symptoms worsen or fail to improve, please contact our office for further instruction, or in case of emergency go directly to the emergency room at the closest medical facility.   Heat on your back 2-3 times as needed. Stretch 2-3 times per day.

## 2014-10-15 NOTE — Progress Notes (Signed)
Subjective:    Patient ID: [name], female    DOB: 1940-07-13, 74 y.o.   MRN: [id number]  Chief Complaint  Patient presents with  . Back Pain    having really bad pain in her back between her shoulder blade towards the left side and says it comes over shoulder and down breast line, since yesterday afternoon , requested to have BMET done    HPI:  [name] is a 74 y.o. female who presents today to follow up.  1) Blood pressure - Has been improved since last visit. Currently treated with amlodipine, valsartan-hydrochlorothiazide, and metoprolol. Denies any adverse effects of medications. Indicates that her home blood pressure readings have improved since her previous hospitalization.  BP Readings from Last 3 Encounters:  10/15/14 152/74  09/14/14 176/72  09/10/14 202/70    2) Therapeutic drug monitoring - Is due for INR check as she is mantained on coumadin.   3) Back pain - Associated symptom of pain located on the left side of her back has been going on for about a day on top of her previous back pain. Indicates that she took previously prescribed medication which helped her to feel a little better and allowed her to get some sleep. Denies any trauma to the area.    Allergies  Allergen Reactions  . Iodinated Diagnostic Agents Anaphylaxis    Info given by patient  . Milk-Related Compounds     Lactose intolerance  . Darvon [Propoxyphene] Rash  . Hydralazine Anxiety    Facial flushing, pt prefers not to use it.     Current Outpatient Prescriptions on File Prior to Visit  Medication Sig Dispense Refill  . amLODipine (NORVASC) 10 MG tablet Take 1 tablet (10 mg total) by mouth daily. 90 tablet 3  . atorvastatin (LIPITOR) 20 MG tablet Take 20 mg by mouth at bedtime.  5  . butalbital-acetaminophen-caffeine (FIORICET, ESGIC) 50-325-40 MG per tablet Take 1 tablet by mouth every 4 (four) hours as needed for headache. 14 tablet 0  . cloNIDine (CATAPRES) 0.1 MG tablet  Take 1 tablet (0.1 mg total) by mouth daily. 30 tablet 0  . furosemide (LASIX) 40 MG tablet Take 1-2 tablets (40-80 mg total) by mouth daily. 30 tablet 1  . gabapentin (NEURONTIN) 100 MG capsule Take 1 capsule (100 mg total) by mouth 3 (three) times daily. 90 capsule 0  . meclizine (ANTIVERT) 25 MG tablet Take 1 tablet (25 mg total) by mouth 3 (three) times daily as needed for dizziness. 30 tablet 0  . metoCLOPramide (REGLAN) 10 MG tablet Take 10 mg by mouth every 6 (six) hours as needed for nausea.    . metoprolol (LOPRESSOR) 25 MG tablet Take 1 tablet (25 mg total) by mouth 2 (two) times daily. 60 tablet 0  . oxycodone (OXY-IR) 5 MG capsule Take 1 capsule (5 mg total) by mouth 2 (two) times daily as needed for pain. 60 capsule 0  . pantoprazole (PROTONIX) 40 MG tablet Take 1 tablet (40 mg total) by mouth daily. 90 tablet 3  . polyethylene glycol (MIRALAX / GLYCOLAX) packet Take 17 g by mouth daily as needed for moderate constipation. 14 each 0  . simvastatin (ZOCOR) 20 MG tablet Take 1 tablet (20 mg total) by mouth daily. 90 tablet 3  . temazepam (RESTORIL) 15 MG capsule TAKE ONE CAPSULE BY MOUTH AT BEDTIME 30 capsule 0  . tizanidine (ZANAFLEX) 2 MG capsule Take 2 mg by mouth every 6 (six) hours as  needed for muscle spasms.    . traMADol (ULTRAM) 50 MG tablet Take by mouth every 6 (six) hours as needed.    . valsartan-hydrochlorothiazide (DIOVAN-HCT) 320-25 MG per tablet Take 1 tablet by mouth daily. 30 tablet 6  . vitamin B-12 (CYANOCOBALAMIN) 1000 MCG tablet Take 1,000 mcg by mouth daily.    [name] warfarin (COUMADIN) 5 MG tablet Take 1 tablet (5 mg total) by mouth at bedtime. As directed per INR. Please do not take on 2/26 and 2/27. Resume on 08/30/14. INR check on Monday or Tuesday.     No current facility-administered medications on file prior to visit.    Past Medical History  Diagnosis Date  . CHF (congestive heart failure)   . Hypertension   . Anemia   . Broken back 2013  . Atrial  fibrillation   . GERD (gastroesophageal reflux disease)   . Arthritis     Review of Systems  Respiratory: Negative for chest tightness.   Cardiovascular: Negative for chest pain, palpitations and leg swelling.  Musculoskeletal:       Positive for shoulder/neck pain.   Hematological: Does not bruise/bleed easily.  Psychiatric/Behavioral: Positive for sleep disturbance.      Objective:    BP 152/74 mmHg  Pulse 54  Temp(Src) 97.6 F (36.4 C) (Oral)  Resp 18  Ht 5\' 2"  (1.575 m)  Wt 134 lb (60.782 kg)  BMI 24.50 kg/m2  SpO2 97% Nursing note and vital signs reviewed.  Physical Exam  Constitutional: She is oriented to person, place, and time. She appears well-developed and well-nourished. No distress.  Cardiovascular: Normal rate, regular rhythm, normal heart sounds and intact distal pulses.   Pulmonary/Chest: Effort normal and breath sounds normal.  Musculoskeletal:  No obvious deformity, discoloration, or edema of thoracic spine or mid back noted. Palpable tenderness between the vertebral border of the scapula and her spine on the left. Muscle spasm noted. Patient displays full range of motion in neck and left shoulder motions. Distal pulses, sensation, and reflexes are intact and appropriate.  Neurological: She is alert and oriented to person, place, and time.  Skin: Skin is warm and dry.  Psychiatric: She has a normal mood and affect. Her behavior is normal. Judgment and thought content normal.       Assessment & Plan:

## 2014-10-16 ENCOUNTER — Encounter: Payer: Self-pay | Admitting: Family

## 2014-10-16 ENCOUNTER — Telehealth: Payer: Self-pay | Admitting: Family

## 2014-10-16 NOTE — Telephone Encounter (Signed)
Your lab results show your INR is 1.6 which is slightly low, therefore, please take 1.5 pills of coumadin tonight and then return to taking one pill daily. Otherwise your basic metabolic panel showing your kidney function and electrolytes was normal.

## 2014-10-16 NOTE — Telephone Encounter (Signed)
Left detailed VM of pts results below. Asked pt to call back if she had anymore questions.

## 2014-11-09 ENCOUNTER — Encounter: Payer: Self-pay | Admitting: Family

## 2014-11-10 ENCOUNTER — Encounter: Payer: Self-pay | Admitting: Rehabilitative and Restorative Service Providers"

## 2014-11-10 NOTE — Therapy (Signed)
[facility] [address] [place], [place], [address] Phone: [phone]   Fax:  [phone]  Patient Details  Name: [name] MRN: [id number] Date of Birth: 09/28/1940 Referring Provider:  No ref. provider found  Encounter Date: 11/10/2014  PHYSICAL THERAPY DISCHARGE SUMMARY  Visits from Start of Care: 3  Current functional level related to goals / functional outcomes:     PT Long Term Goals - 09/21/14 1054    PT LONG TERM GOAL #1   Title The patient will return demo HEP for balance and mobility.  Target date 10/04/2014   Baseline Pt has HEP, however is not tolerating due to pain.     Time 4   Period Weeks   Status Achieved   PT LONG TERM GOAL #2   Title The patient will verbalize 3 strategies to manage low back pain at home.  Target date 10/04/2014   Baseline Nothing improves pain at home.   Time 4   Period Weeks   Status Not Met   PT LONG TERM GOAL #3   Title *Further goals to follow, as patient allows.  At this time, she requests 1-2 visits only.  Berg goal, gait speed goal to follow if more visits scheduled.   Status Deferred        Remaining deficits: Back pain limits functional mobility training at this time.   Education / Equipment: Patient has HEP.  She requested hold due to back pain.  Plan: Patient agrees to discharge.  Patient goals were not met. Patient is being discharged due to a change in medical status.  ?????       Patient with back pain limiting her participation in therapy.   [place], [place] 11/10/2014, 9:40 AM  [facility] [address] [place], [place], [address] Phone: [phone]   Fax:  [phone]

## 2014-11-11 ENCOUNTER — Encounter (INDEPENDENT_AMBULATORY_CARE_PROVIDER_SITE_OTHER): Payer: Medicare Other | Admitting: Ophthalmology

## 2014-11-11 DIAGNOSIS — H2513 Age-related nuclear cataract, bilateral: Secondary | ICD-10-CM

## 2014-11-11 DIAGNOSIS — H35033 Hypertensive retinopathy, bilateral: Secondary | ICD-10-CM | POA: Diagnosis not present

## 2014-11-11 DIAGNOSIS — H43813 Vitreous degeneration, bilateral: Secondary | ICD-10-CM | POA: Diagnosis not present

## 2014-11-11 DIAGNOSIS — H35433 Paving stone degeneration of retina, bilateral: Secondary | ICD-10-CM

## 2014-11-11 DIAGNOSIS — H3531 Nonexudative age-related macular degeneration: Secondary | ICD-10-CM

## 2014-11-11 DIAGNOSIS — I10 Essential (primary) hypertension: Secondary | ICD-10-CM | POA: Diagnosis not present

## 2014-11-13 ENCOUNTER — Telehealth: Payer: Self-pay | Admitting: Family

## 2014-11-13 MED ORDER — GABAPENTIN 100 MG PO CAPS: 100.0000 mg | ORAL_CAPSULE | Freq: Three times a day (TID) | ORAL | Status: DC

## 2014-11-13 MED ORDER — TEMAZEPAM 15 MG PO CAPS: 15.0000 mg | ORAL_CAPSULE | Freq: Every day | ORAL | Status: DC

## 2014-11-13 MED ORDER — SIMVASTATIN 20 MG PO TABS: 20.0000 mg | ORAL_TABLET | Freq: Every day | ORAL | Status: DC

## 2014-11-13 NOTE — Addendum Note (Signed)
Addended by: Mauricio Po D on: 11/13/2014 10:10 AM   Modules accepted: Orders

## 2014-11-13 NOTE — Telephone Encounter (Signed)
Notified pt refills sent to [facility]...[name]

## 2014-11-13 NOTE — Telephone Encounter (Signed)
Patient states she also needs gabapentin and simvastatin refilled as well.

## 2014-11-13 NOTE — Addendum Note (Signed)
Addended by: Earnstine Regal on: 11/13/2014 08:35 AM   Modules accepted: Orders

## 2014-11-13 NOTE — Telephone Encounter (Signed)
Medication refilled

## 2014-11-13 NOTE — Telephone Encounter (Signed)
Refills for gabapentin & simvastatin has been sent pls advise on temazepam.../lmb

## 2014-12-15 ENCOUNTER — Encounter: Payer: Self-pay | Admitting: Internal Medicine

## 2014-12-15 ENCOUNTER — Ambulatory Visit (INDEPENDENT_AMBULATORY_CARE_PROVIDER_SITE_OTHER): Payer: 59 | Admitting: Internal Medicine

## 2014-12-15 ENCOUNTER — Telehealth: Payer: Self-pay | Admitting: Family

## 2014-12-15 ENCOUNTER — Other Ambulatory Visit (INDEPENDENT_AMBULATORY_CARE_PROVIDER_SITE_OTHER): Payer: 59

## 2014-12-15 VITALS — BP 130/66 | HR 63 | Ht 62.0 in | Wt 132.8 lb

## 2014-12-15 DIAGNOSIS — I480 Paroxysmal atrial fibrillation: Secondary | ICD-10-CM

## 2014-12-15 DIAGNOSIS — I350 Nonrheumatic aortic (valve) stenosis: Secondary | ICD-10-CM | POA: Diagnosis not present

## 2014-12-15 DIAGNOSIS — G479 Sleep disorder, unspecified: Secondary | ICD-10-CM | POA: Diagnosis not present

## 2014-12-15 DIAGNOSIS — I10 Essential (primary) hypertension: Secondary | ICD-10-CM

## 2014-12-15 DIAGNOSIS — Z5181 Encounter for therapeutic drug level monitoring: Secondary | ICD-10-CM

## 2014-12-15 LAB — PROTIME-INR
INR: 2.7 ratio — ABNORMAL HIGH (ref 0.8–1.0)
Prothrombin Time: 29.6 s — ABNORMAL HIGH (ref 9.6–13.1)

## 2014-12-15 NOTE — Patient Instructions (Addendum)
Your physician wants you to follow-up in: 1 year with Dr. Debara Pickett. You will receive a reminder letter in the mail two months in advance. If you don't receive a letter, please call our office to schedule the follow-up appointment.  Please have echocardiogram in August/September 2016

## 2014-12-15 NOTE — Telephone Encounter (Signed)
Pt called in and would like to know if she needs an appt to get coum checked or can a order be put in and she just go down to the lab?     Best number [phone]

## 2014-12-15 NOTE — Progress Notes (Signed)
OFFICE NOTE  Chief Complaint:  Routine follow-up  Primary Care Physician: Mauricio Po, [name]  HPI:  [name] is a pleasant 74 year old female kindly referred to me be Dr. Ardeth Perfect. She reports a past medical history significant for atrial fibrillation and congestive heart failure, which occurred around the same time and probably 2012. She vaguely recalls seeing Dr. Irish Lack at that time.  She was started on warfarin therapy and no attempts were made to get her back into rhythm as she was paroxysmal. She does has a long-standing history of hypertension and dyslipidemia. She apparently developed heart failure or however has never had reassessment of her ejection fraction. She subsequently was having health issues and decided to move with her son to live in [place]. She says at that time she had some problems with chest pain and ultimately underwent a stress test. This was associated with an anaphylactic type reaction and despite their reassurances she will "no longer have any more stress test". She says she has a severe allergy to IV contrast dye which causes throat swelling as well as milk which also causes the same symptoms. Apparently she also underwent cardiac catheterization however she says that only medical therapy was recommended. This was in [place], [place].  We are trying to request records from there as well. She subsequently moved with her son to [facility], however he lost his job and she recently moved back to [place]. Currently she denies any significant shortness of breath but does report some mild leg edema which is slightly worse and occasional pain in her left chest and left arm over the last several days. She's also had pain in the right chest. None of those symptoms are worse with exertion or relieved by rest or medication.   [name] returns today for followup. She again appears upset and quite nervous. Blood pressure recently has been significantly elevated. In  fact she went to the emergency room and was hospitalized for blood pressure control. Although it seems that her blood pressure more easily was controlled once her oral medicines were restarted. Subsequently she's been started on clonidine is now up to twice a day. Blood pressure today was still high at 703 systolic. She reports some occasional chest discomfort, mostly at rest and reports not being active enough to know if she has symptoms with exertion. She also has pain all over body significant low back pain and is seeing a specialist about that in the next few weeks.  I saw [name] back in the office today. She reports some improvement in her chest discomfort and I think this is related to blood pressure. The recent switch in her blood pressure medicines indicate a marked improvement in her pressures in she is noted to be 130/66 today. She does get some slight shortness of breath with exertion and no regular chest discomfort. At this time there is not clear evidence to pursue a further workup although I have a low threshold for further cardiac workup if she should become more symptomatic. She is in sinus rhythm and seems to be maintaining that.  PMHx:  Past Medical History  Diagnosis Date  . CHF (congestive heart failure)   . Hypertension   . Anemia   . Broken back 2013  . Atrial fibrillation   . GERD (gastroesophageal reflux disease)   . Arthritis     Past Surgical History  Procedure Laterality Date  . Lumbar disc surgery  1991  . Lumbar back surgery  2012  .  Cervical disc surgery  2014  . Cholecystectomy  1980s  . Appendectomy  1950s  . Tonsillectomy and adenoidectomy  1960s  . Carpal tunnel release Bilateral 1990s    FAMHx:  Family History  Problem Relation Age of Onset  . Stroke Mother   . Heart disease Mother   . Emphysema Father   . Ovarian cancer Sister   . Stroke Sister   . Other Child     died at birth    SOCHx:   reports that she has never smoked. She has never  used smokeless tobacco. She reports that she does not drink alcohol or use illicit drugs.  ALLERGIES:  Allergies  Allergen Reactions  . Iodinated Diagnostic Agents Anaphylaxis    Info given by patient  . Milk-Related Compounds     Lactose intolerance  . Darvon [Propoxyphene] Rash  . Hydralazine Anxiety    Facial flushing, pt prefers not to use it.     ROS: A comprehensive review of systems was negative.  HOME MEDS: Current Outpatient Prescriptions  Medication Sig Dispense Refill  . amLODipine (NORVASC) 10 MG tablet Take 1 tablet (10 mg total) by mouth daily. 90 tablet 3  . atorvastatin (LIPITOR) 20 MG tablet Take 20 mg by mouth at bedtime.  5  . butalbital-acetaminophen-caffeine (FIORICET, ESGIC) 50-325-40 MG per tablet Take 1 tablet by mouth every 4 (four) hours as needed for headache. 14 tablet 0  . cloNIDine (CATAPRES) 0.1 MG tablet Take 1 tablet (0.1 mg total) by mouth daily. 30 tablet 0  . furosemide (LASIX) 40 MG tablet Take 1-2 tablets (40-80 mg total) by mouth daily. 30 tablet 1  . gabapentin (NEURONTIN) 100 MG capsule Take 1 capsule (100 mg total) by mouth 3 (three) times daily. 90 capsule 3  . meclizine (ANTIVERT) 25 MG tablet Take 1 tablet (25 mg total) by mouth 3 (three) times daily as needed for dizziness. 30 tablet 0  . metoCLOPramide (REGLAN) 10 MG tablet Take 10 mg by mouth every 6 (six) hours as needed for nausea.    . metoprolol (LOPRESSOR) 25 MG tablet Take 1 tablet (25 mg total) by mouth 2 (two) times daily. 60 tablet 0  . oxycodone (OXY-IR) 5 MG capsule Take 1 capsule (5 mg total) by mouth 2 (two) times daily as needed for pain. 60 capsule 0  . pantoprazole (PROTONIX) 40 MG tablet Take 1 tablet (40 mg total) by mouth daily. 90 tablet 3  . polyethylene glycol (MIRALAX / GLYCOLAX) packet Take 17 g by mouth daily as needed for moderate constipation. 14 each 0  . simvastatin (ZOCOR) 20 MG tablet Take 1 tablet (20 mg total) by mouth daily. 90 tablet 3  . temazepam  (RESTORIL) 15 MG capsule Take 1 capsule (15 mg total) by mouth at bedtime. 30 capsule 0  . tizanidine (ZANAFLEX) 2 MG capsule Take 2 mg by mouth every 6 (six) hours as needed for muscle spasms.    . traMADol (ULTRAM) 50 MG tablet Take 1 tablet (50 mg total) by mouth every 6 (six) hours as needed. 30 tablet 0  . valsartan-hydrochlorothiazide (DIOVAN-HCT) 320-25 MG per tablet Take 1 tablet by mouth daily. 30 tablet 6  . vitamin B-12 (CYANOCOBALAMIN) 1000 MCG tablet Take 1,000 mcg by mouth daily.    [name] warfarin (COUMADIN) 5 MG tablet Take 1 tablet (5 mg total) by mouth at bedtime. As directed per INR. Please do not take on 2/26 and 2/27. Resume on 08/30/14. INR check on Monday or Tuesday.  No current facility-administered medications for this visit.    LABS/IMAGING: No results found for this or any previous visit (from the past 48 hour(s)). No results found.  VITALS: BP 130/66 mmHg  Pulse 63  Ht 5\' 2"  (1.575 m)  Wt 132 lb 12.8 oz (60.238 kg)  BMI 24.28 kg/m2  EXAM: deferred  EKG: Normal sinus rhythm at 63  ASSESSMENT: 1. Paroxysmal atrial fibrillation on warfarin 2. History of "congestive heart failure" - EF 60-65% on echo 3. Hypertension - controlled 4. Dyslipidemia 5. Mild to moderate aortic stenosis  PLAN: 1.   [name] is maintaining sinus rhythm. She is on warfarin anticoagulation. She does have mild to moderate aortic stenosis. Recently she's had problems with uncontrolled hypertension. Recent changes to her blood pressure medicines seem to be effective. Blood pressure is now better controlled. She will need a repeat echocardiogram to follow her aortic stenosis sometime this fall. I think we can go back and see her again in 6 months to year if everything remains stable. She will need to continue to have her warfarin levels checked. Overall think she is doing well.   Pixie Casino, MD, [facility] Attending Cardiologist [place] 12/15/2014, 11:45  AM

## 2014-12-16 ENCOUNTER — Telehealth: Payer: Self-pay | Admitting: Family

## 2014-12-16 NOTE — Telephone Encounter (Signed)
Left detailed message letting pt know. 

## 2014-12-16 NOTE — Telephone Encounter (Signed)
Please inform patient that her coumadin levels are great, therefore please continue to take coumadin as currently prescribed. Follow up in 6 weeks.

## 2014-12-16 NOTE — Telephone Encounter (Signed)
Completed and phone message sent.

## 2014-12-21 ENCOUNTER — Ambulatory Visit (INDEPENDENT_AMBULATORY_CARE_PROVIDER_SITE_OTHER): Payer: Medicare Other | Admitting: General Practice

## 2015-01-15 ENCOUNTER — Telehealth: Payer: Self-pay | Admitting: Family

## 2015-01-15 MED ORDER — TRAMADOL HCL 50 MG PO TABS: 50.0000 mg | ORAL_TABLET | Freq: Four times a day (QID) | ORAL | Status: DC | PRN

## 2015-01-15 NOTE — Telephone Encounter (Signed)
Patient is requesting refill on tramadol to be sent to [facility] in [place].

## 2015-01-15 NOTE — Telephone Encounter (Signed)
Medication filled and faxed.

## 2015-02-01 ENCOUNTER — Ambulatory Visit (INDEPENDENT_AMBULATORY_CARE_PROVIDER_SITE_OTHER): Payer: 59 | Admitting: Family

## 2015-02-01 ENCOUNTER — Other Ambulatory Visit (INDEPENDENT_AMBULATORY_CARE_PROVIDER_SITE_OTHER): Payer: 59

## 2015-02-01 ENCOUNTER — Encounter: Payer: Self-pay | Admitting: Family

## 2015-02-01 VITALS — BP 120/54 | HR 53 | Temp 97.6°F | Resp 18 | Ht 62.0 in | Wt 131.0 lb

## 2015-02-01 DIAGNOSIS — Z5181 Encounter for therapeutic drug level monitoring: Secondary | ICD-10-CM | POA: Diagnosis not present

## 2015-02-01 DIAGNOSIS — Z7901 Long term (current) use of anticoagulants: Secondary | ICD-10-CM

## 2015-02-01 DIAGNOSIS — R197 Diarrhea, unspecified: Secondary | ICD-10-CM | POA: Diagnosis not present

## 2015-02-01 LAB — PROTIME-INR
INR: 4.4 ratio — ABNORMAL HIGH (ref 0.8–1.0)
Prothrombin Time: 46.6 s — ABNORMAL HIGH (ref 9.6–13.1)

## 2015-02-01 MED ORDER — PANTOPRAZOLE SODIUM 40 MG PO TBEC: 40.0000 mg | DELAYED_RELEASE_TABLET | Freq: Every day | ORAL | Status: DC

## 2015-02-01 MED ORDER — METRONIDAZOLE 500 MG PO TABS: 500.0000 mg | ORAL_TABLET | Freq: Three times a day (TID) | ORAL | Status: DC

## 2015-02-01 MED ORDER — ONDANSETRON HCL 4 MG PO TABS: 4.0000 mg | ORAL_TABLET | Freq: Three times a day (TID) | ORAL | Status: DC | PRN

## 2015-02-01 NOTE — Assessment & Plan Note (Signed)
Diarrhea that has lasted about 1 month. Obtain stool culture, C. Diff and ova and parasite. Obtain CMET. Start metronidazole to cover for potential C. Diif or diverticulitis. Start Zofran as needed for nausea. Encouraged to stay hydrated with sports drinks and water. Advance diet as tolerated. Follow up pending stool test results.

## 2015-02-01 NOTE — Progress Notes (Signed)
Subjective:    Patient ID: [name], female    DOB: January 18, 1941, 74 y.o.   MRN: [id number]  Chief Complaint  Patient presents with  . Diarrhea    x1 month has had diarrhea, nausea, dizziness, and headaches, has felt a few times that she could pass out    HPI:  [name] is a 74 y.o. female   has a past medical history of CHF (congestive heart failure); Hypertension; Anemia; Broken back (2013); Atrial fibrillation; GERD (gastroesophageal reflux disease); and Arthritis. who presents today for an acute office visit.   1.) Diarrhea - This is a new problem. Associated symptoms of diarrhea, nausea, dizziness and headache have been going on for about 1 month. Indicates that she has multiple bowel movements per day up to 7-8. Notes a worsening the last week. Denies any recent antibiotics use. Food causes her to have diarrhea. Denies any modifying factors that make it better including over the counter medications. The severity of symptoms have been worse enough that she almost "fainted" a couple of times. She has been able to eat some. Indicates that she tries to drink plenty of fluids.   2.) Warfarin - Currently maintained on coumadin for anticoagulation. Takes the medication as prescribed and denies adverse side effects.   Lab Results  Component Value Date   INR 2.7* 12/15/2014   INR 1.6* 10/15/2014   INR 3.4* 09/10/2014     Allergies  Allergen Reactions  . Iodinated Diagnostic Agents Anaphylaxis    Info given by patient  . Milk-Related Compounds     Lactose intolerance  . Darvon [Propoxyphene] Rash  . Hydralazine Anxiety    Facial flushing, pt prefers not to use it.     Current Outpatient Prescriptions on File Prior to Visit  Medication Sig Dispense Refill  . amLODipine (NORVASC) 10 MG tablet Take 1 tablet (10 mg total) by mouth daily. 90 tablet 3  . atorvastatin (LIPITOR) 20 MG tablet Take 20 mg by mouth at bedtime.  5  . butalbital-acetaminophen-caffeine (FIORICET,  ESGIC) 50-325-40 MG per tablet Take 1 tablet by mouth every 4 (four) hours as needed for headache. 14 tablet 0  . cloNIDine (CATAPRES) 0.1 MG tablet Take 1 tablet (0.1 mg total) by mouth daily. 30 tablet 0  . furosemide (LASIX) 40 MG tablet Take 1-2 tablets (40-80 mg total) by mouth daily. 30 tablet 1  . gabapentin (NEURONTIN) 100 MG capsule Take 1 capsule (100 mg total) by mouth 3 (three) times daily. 90 capsule 3  . meclizine (ANTIVERT) 25 MG tablet Take 1 tablet (25 mg total) by mouth 3 (three) times daily as needed for dizziness. 30 tablet 0  . metoCLOPramide (REGLAN) 10 MG tablet Take 10 mg by mouth every 6 (six) hours as needed for nausea.    . metoprolol (LOPRESSOR) 25 MG tablet Take 1 tablet (25 mg total) by mouth 2 (two) times daily. 60 tablet 0  . oxycodone (OXY-IR) 5 MG capsule Take 1 capsule (5 mg total) by mouth 2 (two) times daily as needed for pain. 60 capsule 0  . polyethylene glycol (MIRALAX / GLYCOLAX) packet Take 17 g by mouth daily as needed for moderate constipation. 14 each 0  . simvastatin (ZOCOR) 20 MG tablet Take 1 tablet (20 mg total) by mouth daily. 90 tablet 3  . temazepam (RESTORIL) 15 MG capsule Take 1 capsule (15 mg total) by mouth at bedtime. 30 capsule 0  . tizanidine (ZANAFLEX) 2 MG capsule Take 2 mg  by mouth every 6 (six) hours as needed for muscle spasms.    . traMADol (ULTRAM) 50 MG tablet Take 1 tablet (50 mg total) by mouth every 6 (six) hours as needed. 30 tablet 0  . valsartan-hydrochlorothiazide (DIOVAN-HCT) 320-25 MG per tablet Take 1 tablet by mouth daily. 30 tablet 6  . vitamin B-12 (CYANOCOBALAMIN) 1000 MCG tablet Take 1,000 mcg by mouth daily.    . warfarin (COUMADIN) 5 MG tablet Take 1 tablet (5 mg total) by mouth at bedtime. As directed per INR. Please do not take on 2/26 and 2/27. Resume on 08/30/14. INR check on Monday or Tuesday.     No current facility-administered medications on file prior to visit.    Past Medical History  Diagnosis Date  .  CHF (congestive heart failure)   . Hypertension   . Anemia   . Broken back 2013  . Atrial fibrillation   . GERD (gastroesophageal reflux disease)   . Arthritis      Review of Systems  Constitutional: Negative for fever and chills.  Gastrointestinal: Positive for nausea and diarrhea. Negative for vomiting, abdominal pain, constipation and blood in stool.  Neurological: Positive for headaches.      Objective:    BP 120/54 mmHg  Pulse 53  Temp(Src) 97.6 F (36.4 C) (Oral)  Resp 18  Ht 5' 2" (1.575 m)  Wt 131 lb (59.421 kg)  BMI 23.95 kg/m2  SpO2 97% Nursing note and vital signs reviewed.  Physical Exam  Constitutional: She is oriented to person, place, and time. She appears well-developed and well-nourished. No distress.  Cardiovascular: Normal rate, regular rhythm, normal heart sounds and intact distal pulses.   Pulmonary/Chest: Effort normal and breath sounds normal.  Abdominal: Soft. Normal appearance and bowel sounds are normal. She exhibits no distension. There is no hepatosplenomegaly. There is tenderness in the suprapubic area and left lower quadrant. There is no rigidity, no rebound, no guarding, no tenderness at McBurney's point and negative Murphy's sign.  Neurological: She is alert and oriented to person, place, and time.  Skin: Skin is warm and dry.  Psychiatric: She has a normal mood and affect. Her behavior is normal. Judgment and thought content normal.       Assessment & Plan:   Problem List Items Addressed This Visit      Other   Long term current use of anticoagulant therapy   Relevant Orders   INR/PT   Encounter for therapeutic drug monitoring    Currently maintained on coumadin. Obtain INR. Continue current dose of coumadin pending INR results.       Relevant Orders   INR/PT   Diarrhea - Primary    Diarrhea that has lasted about 1 month. Obtain stool culture, C. Diff and ova and parasite. Obtain CMET. Start metronidazole to cover for potential  C. Diif or diverticulitis. Start Zofran as needed for nausea. Encouraged to stay hydrated with sports drinks and water. Advance diet as tolerated. Follow up pending stool test results.       Relevant Medications   metroNIDAZOLE (FLAGYL) 500 MG tablet   ondansetron (ZOFRAN) 4 MG tablet   Other Relevant Orders   Stool Culture   Clostridium Difficile by PCR (not at [facility])   Ova and parasite examination   Comp Met (CMET)       

## 2015-02-01 NOTE — Assessment & Plan Note (Signed)
Currently maintained on coumadin. Obtain INR. Continue current dose of coumadin pending INR results.

## 2015-02-01 NOTE — Progress Notes (Signed)
Pre visit review using our clinic review tool, if applicable. No additional management support is needed unless otherwise documented below in the visit note. 

## 2015-02-01 NOTE — Patient Instructions (Addendum)
Thank you for choosing [facility].  Summary/Instructions:  Your prescription(s) have been submitted to your pharmacy or been printed and provided for you. Please take as directed and contact our office if you believe you are having problem(s) with the medication(s) or have any questions.  Please stop by the lab on the basement level of the building for your blood work. Your results will be released to [place] (or called to you) after review, usually within 72 hours after test completion. If any changes need to be made, you will be notified at that same time.  If your symptoms worsen or fail to improve, please contact our office for further instruction, or in case of emergency go directly to the emergency room at the closest medical facility.   Diarrhea Diarrhea is frequent loose and watery bowel movements. It can cause you to feel weak and dehydrated. Dehydration can cause you to become tired and thirsty, have a dry mouth, and have decreased urination that often is dark yellow. Diarrhea is a sign of another problem, most often an infection that will not last long. In most cases, diarrhea typically lasts 2-3 days. However, it can last longer if it is a sign of something more serious. It is important to treat your diarrhea as directed by your caregiver to lessen or prevent future episodes of diarrhea. CAUSES  Some common causes include:  Gastrointestinal infections caused by viruses, bacteria, or parasites.  Food poisoning or food allergies.  Certain medicines, such as antibiotics, chemotherapy, and laxatives.  Artificial sweeteners and fructose.  Digestive disorders. HOME CARE INSTRUCTIONS  Ensure adequate fluid intake (hydration): Have 1 cup (8 oz) of fluid for each diarrhea episode. Avoid fluids that contain simple sugars or sports drinks, fruit juices, whole milk products, and sodas. Your urine should be clear or pale yellow if you are drinking enough fluids. Hydrate with an oral  rehydration solution that you can purchase at pharmacies, retail stores, and online. You can prepare an oral rehydration solution at home by mixing the following ingredients together:   - tsp table salt.   tsp baking soda.   tsp salt substitute containing potassium chloride.  1  tablespoons sugar.  1 L (34 oz) of water.  Certain foods and beverages may increase the speed at which food moves through the gastrointestinal (GI) tract. These foods and beverages should be avoided and include:  Caffeinated and alcoholic beverages.  High-fiber foods, such as raw fruits and vegetables, nuts, seeds, and whole grain breads and cereals.  Foods and beverages sweetened with sugar alcohols, such as xylitol, sorbitol, and mannitol.  Some foods may be well tolerated and may help thicken stool including:  Starchy foods, such as rice, toast, pasta, low-sugar cereal, oatmeal, grits, baked potatoes, crackers, and bagels.  Bananas.  Applesauce.  Add probiotic-rich foods to help increase healthy bacteria in the GI tract, such as yogurt and fermented milk products.  Wash your hands well after each diarrhea episode.  Only take over-the-counter or prescription medicines as directed by your caregiver.  Take a warm bath to relieve any burning or pain from frequent diarrhea episodes. SEEK IMMEDIATE MEDICAL CARE IF:   You are unable to keep fluids down.  You have persistent vomiting.  You have blood in your stool, or your stools are black and tarry.  You do not urinate in 6-8 hours, or there is only a small amount of very dark urine.  You have abdominal pain that increases or localizes.  You have weakness, dizziness,  confusion, or light-headedness.  You have a severe headache.  Your diarrhea gets worse or does not get better.  You have a fever or persistent symptoms for more than 2-3 days.  You have a fever and your symptoms suddenly get worse. MAKE SURE YOU:   Understand these  instructions.  Will watch your condition.  Will get help right away if you are not doing well or get worse. Document Released: 06/09/2002 Document Revised: 11/03/2013 Document Reviewed: 02/25/2012 [facility] Patient Information 2015 [place], [place]. This information is not intended to replace advice given to you by your health care provider. Make sure you discuss any questions you have with your health care provider.

## 2015-02-02 ENCOUNTER — Encounter: Payer: Self-pay | Admitting: Family

## 2015-02-02 ENCOUNTER — Other Ambulatory Visit: Payer: 59

## 2015-02-02 DIAGNOSIS — R197 Diarrhea, unspecified: Secondary | ICD-10-CM | POA: Diagnosis not present

## 2015-02-03 LAB — OVA AND PARASITE EXAMINATION: OP: NONE SEEN

## 2015-02-03 LAB — CLOSTRIDIUM DIFFICILE BY PCR: Toxigenic C. Difficile by PCR: NOT DETECTED

## 2015-02-05 ENCOUNTER — Encounter: Payer: Self-pay | Admitting: Family

## 2015-02-06 ENCOUNTER — Encounter: Payer: Self-pay | Admitting: Family

## 2015-02-06 LAB — STOOL CULTURE

## 2015-02-08 ENCOUNTER — Encounter: Payer: Self-pay | Admitting: Family

## 2015-02-08 ENCOUNTER — Telehealth: Payer: Self-pay | Admitting: Internal Medicine

## 2015-02-08 NOTE — Telephone Encounter (Signed)
LMTCB

## 2015-02-08 NOTE — Telephone Encounter (Signed)
Notified patient of Dr. Lurline Del advice/info. Patient reports she has contact PCP regarding her concerns as well. She voiced understanding of need to go to ED if shortness of breath worsens.

## 2015-02-08 NOTE — Telephone Encounter (Signed)
[name] is calling because she has been having shortness of breath, dizziness, fatique.. Please call   Thanks

## 2015-02-08 NOTE — Telephone Encounter (Signed)
Agree. Symptoms could be explained by anemia after GI bleeding/melena. Should got to ED if dyspnea is severe

## 2015-02-08 NOTE — Telephone Encounter (Signed)
Spoke with patient who reports for over 1 week she has been short of breath, dizzy, fatigue. She has had a "rough day" today with the same symptoms along with trouble getting her words out. She reports no recent med changes other than PCP Rx'ed an antibiotic for her diarrhea. She reports her diarrhea has resolved but she is going to the bathroom often and her stool is black. Advised her to contact PCP regarding her black stool (check CBC?) as her INR was high as well.   Informed patient I would send the message to Dr. Debara Pickett, DOD (Dr. Sallyanne Kuster) to review and advise.  PCP cc'ed on message as well

## 2015-02-12 ENCOUNTER — Emergency Department (HOSPITAL_COMMUNITY): Payer: Medicare Other

## 2015-02-12 ENCOUNTER — Other Ambulatory Visit: Payer: Self-pay | Admitting: Family

## 2015-02-12 ENCOUNTER — Inpatient Hospital Stay (HOSPITAL_COMMUNITY)
Admission: EM | Admit: 2015-02-12 | Discharge: 2015-02-17 | DRG: 378 | Disposition: A | Discharge status: Home Health | Payer: Medicare Other | Attending: Internal Medicine | Admitting: Internal Medicine

## 2015-02-12 ENCOUNTER — Encounter (HOSPITAL_COMMUNITY): Payer: Self-pay | Admitting: Emergency Medicine

## 2015-02-12 DIAGNOSIS — I4892 Unspecified atrial flutter: Secondary | ICD-10-CM | POA: Diagnosis present

## 2015-02-12 DIAGNOSIS — R791 Abnormal coagulation profile: Secondary | ICD-10-CM

## 2015-02-12 DIAGNOSIS — I480 Paroxysmal atrial fibrillation: Secondary | ICD-10-CM | POA: Diagnosis not present

## 2015-02-12 DIAGNOSIS — I350 Nonrheumatic aortic (valve) stenosis: Secondary | ICD-10-CM

## 2015-02-12 DIAGNOSIS — D649 Anemia, unspecified: Secondary | ICD-10-CM | POA: Diagnosis not present

## 2015-02-12 DIAGNOSIS — R0602 Shortness of breath: Secondary | ICD-10-CM

## 2015-02-12 DIAGNOSIS — G8929 Other chronic pain: Secondary | ICD-10-CM | POA: Diagnosis present

## 2015-02-12 DIAGNOSIS — Z823 Family history of stroke: Secondary | ICD-10-CM

## 2015-02-12 DIAGNOSIS — I4891 Unspecified atrial fibrillation: Secondary | ICD-10-CM

## 2015-02-12 DIAGNOSIS — D62 Acute posthemorrhagic anemia: Secondary | ICD-10-CM | POA: Diagnosis present

## 2015-02-12 DIAGNOSIS — D124 Benign neoplasm of descending colon: Secondary | ICD-10-CM

## 2015-02-12 DIAGNOSIS — K922 Gastrointestinal hemorrhage, unspecified: Secondary | ICD-10-CM | POA: Diagnosis present

## 2015-02-12 DIAGNOSIS — R0682 Tachypnea, not elsewhere classified: Secondary | ICD-10-CM | POA: Diagnosis not present

## 2015-02-12 DIAGNOSIS — I509 Heart failure, unspecified: Secondary | ICD-10-CM

## 2015-02-12 DIAGNOSIS — K921 Melena: Principal | ICD-10-CM | POA: Diagnosis present

## 2015-02-12 DIAGNOSIS — I5030 Unspecified diastolic (congestive) heart failure: Secondary | ICD-10-CM

## 2015-02-12 DIAGNOSIS — G479 Sleep disorder, unspecified: Secondary | ICD-10-CM | POA: Diagnosis present

## 2015-02-12 DIAGNOSIS — Z825 Family history of asthma and other chronic lower respiratory diseases: Secondary | ICD-10-CM

## 2015-02-12 DIAGNOSIS — Z8041 Family history of malignant neoplasm of ovary: Secondary | ICD-10-CM

## 2015-02-12 DIAGNOSIS — I482 Chronic atrial fibrillation: Secondary | ICD-10-CM | POA: Diagnosis present

## 2015-02-12 DIAGNOSIS — I5032 Chronic diastolic (congestive) heart failure: Secondary | ICD-10-CM

## 2015-02-12 DIAGNOSIS — I10 Essential (primary) hypertension: Secondary | ICD-10-CM | POA: Diagnosis present

## 2015-02-12 DIAGNOSIS — Z8249 Family history of ischemic heart disease and other diseases of the circulatory system: Secondary | ICD-10-CM

## 2015-02-12 DIAGNOSIS — M1990 Unspecified osteoarthritis, unspecified site: Secondary | ICD-10-CM | POA: Diagnosis present

## 2015-02-12 DIAGNOSIS — Z7901 Long term (current) use of anticoagulants: Secondary | ICD-10-CM

## 2015-02-12 DIAGNOSIS — K219 Gastro-esophageal reflux disease without esophagitis: Secondary | ICD-10-CM | POA: Diagnosis present

## 2015-02-12 DIAGNOSIS — K222 Esophageal obstruction: Secondary | ICD-10-CM

## 2015-02-12 DIAGNOSIS — K449 Diaphragmatic hernia without obstruction or gangrene: Secondary | ICD-10-CM | POA: Diagnosis present

## 2015-02-12 DIAGNOSIS — K5730 Diverticulosis of large intestine without perforation or abscess without bleeding: Secondary | ICD-10-CM

## 2015-02-12 HISTORY — DX: Cerebral infarction, unspecified: I63.9

## 2015-02-12 LAB — COMPREHENSIVE METABOLIC PANEL
ALBUMIN: 3.2 g/dL — AB (ref 3.5–5.0)
ALT: 13 U/L — ABNORMAL LOW (ref 14–54)
ANION GAP: 8 (ref 5–15)
AST: 22 U/L (ref 15–41)
Alkaline Phosphatase: 53 U/L (ref 38–126)
BILIRUBIN TOTAL: 0.4 mg/dL (ref 0.3–1.2)
BUN: 13 mg/dL (ref 6–20)
CALCIUM: 8.1 mg/dL — AB (ref 8.9–10.3)
CO2: 23 mmol/L (ref 22–32)
CREATININE: 0.97 mg/dL (ref 0.44–1.00)
Chloride: 109 mmol/L (ref 101–111)
GFR calc Af Amer: >60 mL/min (ref >60)
GFR calc non Af Amer: 56 mL/min — ABNORMAL LOW (ref >60)
Glucose, Bld: 160 mg/dL — ABNORMAL HIGH (ref 65–99)
Potassium: 4.5 mmol/L (ref 3.5–5.1)
Sodium: 140 mmol/L (ref 135–145)
TOTAL PROTEIN: 5.7 g/dL — AB (ref 6.5–8.1)

## 2015-02-12 LAB — CBC WITH DIFFERENTIAL/PLATELET
BASOS ABS: 0 10*3/uL (ref 0.0–0.1)
Basophils Relative: 0 % (ref 0–1)
EOS PCT: 2 % (ref 0–5)
Eosinophils Absolute: 0.1 10*3/uL (ref 0.0–0.7)
HCT: 22.4 % — ABNORMAL LOW (ref 36.0–46.0)
Hemoglobin: 7 g/dL — ABNORMAL LOW (ref 12.0–15.0)
LYMPHS PCT: 24 % (ref 12–46)
Lymphs Abs: 1.1 10*3/uL (ref 0.7–4.0)
MCH: 25.5 pg — AB (ref 26.0–34.0)
MCHC: 31.3 g/dL (ref 30.0–36.0)
MCV: 81.8 fL (ref 78.0–100.0)
MONO ABS: 0.5 10*3/uL (ref 0.1–1.0)
Monocytes Relative: 12 % (ref 3–12)
Neutro Abs: 2.9 10*3/uL (ref 1.7–7.7)
Neutrophils Relative %: 62 % (ref 43–77)
Platelets: 206 10*3/uL (ref 150–400)
RBC: 2.74 MIL/uL — ABNORMAL LOW (ref 3.87–5.11)
RDW: 14.2 % (ref 11.5–15.5)
WBC: 4.7 10*3/uL (ref 4.0–10.5)

## 2015-02-12 LAB — I-STAT CG4 LACTIC ACID, ED: LACTIC ACID, VENOUS: 1.3 mmol/L (ref 0.5–2.0)

## 2015-02-12 LAB — I-STAT TROPONIN, ED: TROPONIN I, POC: 0 ng/mL (ref 0.00–0.08)

## 2015-02-12 LAB — I-STAT CHEM 8, ED
BUN: 14 mg/dL (ref 6–20)
CREATININE: 1 mg/dL (ref 0.44–1.00)
Calcium, Ion: 1.03 mmol/L — ABNORMAL LOW (ref 1.13–1.30)
Chloride: 105 mmol/L (ref 101–111)
Glucose, Bld: 154 mg/dL — ABNORMAL HIGH (ref 65–99)
HCT: 21 % — ABNORMAL LOW (ref 36.0–46.0)
Hemoglobin: 7.1 g/dL — ABNORMAL LOW (ref 12.0–15.0)
POTASSIUM: 4.5 mmol/L (ref 3.5–5.1)
Sodium: 139 mmol/L (ref 135–145)
TCO2: 24 mmol/L (ref 0–100)

## 2015-02-12 LAB — PREPARE RBC (CROSSMATCH)

## 2015-02-12 LAB — PROTIME-INR
INR: 3.65 — AB (ref 0.00–1.49)
PROTHROMBIN TIME: 35.4 s — AB (ref 11.6–15.2)

## 2015-02-12 LAB — BRAIN NATRIURETIC PEPTIDE: B Natriuretic Peptide: 394.8 pg/mL — ABNORMAL HIGH (ref 0.0–100.0)

## 2015-02-12 MED ORDER — SODIUM CHLORIDE 0.9 % IV SOLN
Freq: Once | INTRAVENOUS | Status: AC
  Administered 2015-02-13: 03:00:00 via INTRAVENOUS

## 2015-02-12 MED ORDER — FUROSEMIDE 10 MG/ML IJ SOLN
20.0000 mg | Freq: Once | INTRAMUSCULAR | Status: AC
  Administered 2015-02-12: 20 mg via INTRAVENOUS
  Filled 2015-02-12: qty 4

## 2015-02-12 MED ORDER — VITAMIN K1 10 MG/ML IJ SOLN
5.0000 mg | Freq: Once | INTRAVENOUS | Status: AC
  Administered 2015-02-12: 5 mg via INTRAVENOUS
  Filled 2015-02-12: qty 0.5

## 2015-02-12 MED ORDER — ALBUTEROL SULFATE (2.5 MG/3ML) 0.083% IN NEBU
5.0000 mg | INHALATION_SOLUTION | Freq: Once | RESPIRATORY_TRACT | Status: AC
  Administered 2015-02-12: 5 mg via RESPIRATORY_TRACT
  Filled 2015-02-12: qty 6

## 2015-02-12 NOTE — ED Notes (Signed)
MD at bedside. 

## 2015-02-12 NOTE — ED Notes (Signed)
Bed: WA07 Expected date:  Expected time:  Means of arrival:  Comments: 50F diff breathing

## 2015-02-12 NOTE — ED Notes (Signed)
Per EMs pt. From home with complaint of SOB over a week but got worsen this evening , pt. Has been seen by her PCP and was on medicine, not relieving s/s . Pt. Has CHF and COPD. Also complaint of left shoulder pain at 8/10 which started this evening. Alert and oriented x3, denies chest pain.

## 2015-02-12 NOTE — ED Notes (Signed)
Hospitalist at bedside 

## 2015-02-12 NOTE — ED Provider Notes (Signed)
CSN: [id number]     Arrival date & time 02/12/15  2100 History   First MD Initiated Contact with Patient 02/12/15 2110     Chief Complaint  Patient presents with  . Shortness of Breath  . Shoulder Pain     (Consider location/radiation/quality/duration/timing/severity/associated sxs/prior Treatment) HPI Comments: 74 year old female with history of atrial fibrillation, anticoagulate use, heart failure, high blood pressure, aortic stenosis, presents with shortness of breath worsening for the past week. Worse and this evening. Worse with exertion and lying flat. Patient has had prostate 10 pounds awakened recently, taking meds as directed Lasix 40 mg. No chest pain.  Patient is been taking her blood thinners as directed Coumadin.  Patient has very mild bilateral leg swelling no unilateral symptoms, no recent surgeries.  Patient is a 74 y.o. female presenting with shortness of breath and shoulder pain. The history is provided by the patient.  Shortness of Breath Associated symptoms: cough   Associated symptoms: no abdominal pain, no chest pain, no fever, no headaches, no neck pain, no rash and no vomiting   Shoulder Pain Associated symptoms: fatigue   Associated symptoms: no back pain, no fever and no neck pain     Past Medical History  Diagnosis Date  . CHF (congestive heart failure)   . Hypertension   . Anemia   . Broken back 2013  . Atrial fibrillation   . GERD (gastroesophageal reflux disease)   . Arthritis    Past Surgical History  Procedure Laterality Date  . Lumbar disc surgery  1991  . Lumbar back surgery  2012  . Cervical disc surgery  2014  . Cholecystectomy  1980s  . Appendectomy  1950s  . Tonsillectomy and adenoidectomy  1960s  . Carpal tunnel release Bilateral 1990s   Family History  Problem Relation Age of Onset  . Stroke Mother   . Heart disease Mother   . Emphysema Father   . Ovarian cancer Sister   . Stroke Sister   . Other Child     died at birth    Social History  Substance Use Topics  . Smoking status: Never Smoker   . Smokeless tobacco: Never Used  . Alcohol Use: No   OB History    No data available     Review of Systems  Constitutional: Positive for fatigue. Negative for fever and chills.  HENT: Negative for congestion.   Eyes: Negative for visual disturbance.  Respiratory: Positive for cough and shortness of breath.   Cardiovascular: Positive for leg swelling. Negative for chest pain.  Gastrointestinal: Negative for vomiting and abdominal pain.  Genitourinary: Negative for dysuria and flank pain.  Musculoskeletal: Negative for back pain, neck pain and neck stiffness.  Skin: Negative for rash.  Neurological: Negative for light-headedness and headaches.      Allergies  Iodinated diagnostic agents; Milk-related compounds; Darvon; and Hydralazine  Home Medications   Prior to Admission medications   Medication Sig Start Date End Date Taking? Authorizing Provider  amLODipine (NORVASC) 10 MG tablet Take 1 tablet (10 mg total) by mouth daily. 08/03/14  Yes Golden Circle, [name]  butalbital-acetaminophen-caffeine (FIORICET, ESGIC) [phone] MG per tablet Take 1 tablet by mouth every 4 (four) hours as needed for headache. 08/28/14  Yes Bonnielee Haff, MD  cloNIDine (CATAPRES) 0.1 MG tablet Take 1 tablet (0.1 mg total) by mouth daily. Patient taking differently: Take 0.1 mg by mouth daily as needed (high blood pressure, will take if levels are 180/90).  08/28/14  Yes Gokul  [place] Pink, MD  gabapentin (NEURONTIN) 100 MG capsule Take 1 capsule (100 mg total) by mouth 3 (three) times daily. 11/13/14  Yes Golden Circle, [name]  meclizine (ANTIVERT) 25 MG tablet Take 1 tablet (25 mg total) by mouth 3 (three) times daily as needed for dizziness. 08/28/14  Yes Bonnielee Haff, MD  metoCLOPramide (REGLAN) 10 MG tablet Take 10 mg by mouth every 6 (six) hours as needed for nausea.   Yes Historical Provider, MD  metoprolol (LOPRESSOR) 25 MG  tablet Take 1 tablet (25 mg total) by mouth 2 (two) times daily. 08/28/14  Yes Bonnielee Haff, MD  ondansetron (ZOFRAN) 4 MG tablet Take 1 tablet (4 mg total) by mouth every 8 (eight) hours as needed for nausea or vomiting. 02/01/15  Yes Golden Circle, [name]  oxycodone (OXY-IR) 5 MG capsule Take 1 capsule (5 mg total) by mouth 2 (two) times daily as needed for pain. 08/03/14  Yes Golden Circle, [name]  pantoprazole (PROTONIX) 40 MG tablet Take 1 tablet (40 mg total) by mouth daily. 02/01/15  Yes Golden Circle, [name]  polyethylene glycol (MIRALAX / GLYCOLAX) packet Take 17 g by mouth daily as needed for moderate constipation. 08/28/14  Yes Bonnielee Haff, MD  simvastatin (ZOCOR) 20 MG tablet Take 1 tablet (20 mg total) by mouth daily. 11/13/14  Yes Golden Circle, [name]  temazepam (RESTORIL) 15 MG capsule Take 1 capsule (15 mg total) by mouth at bedtime. 11/13/14  Yes Golden Circle, [name]  tizanidine (ZANAFLEX) 2 MG capsule Take 2 mg by mouth every 6 (six) hours as needed for muscle spasms.   Yes Historical Provider, MD  traMADol (ULTRAM) 50 MG tablet Take 1 tablet (50 mg total) by mouth every 6 (six) hours as needed. Patient taking differently: Take 50 mg by mouth every 6 (six) hours as needed for moderate pain.  01/15/15  Yes Golden Circle, [name]  valsartan-hydrochlorothiazide (DIOVAN-HCT) 320-25 MG per tablet Take 1 tablet by mouth daily. 09/14/14  Yes Pixie Casino, MD  vitamin B-12 (CYANOCOBALAMIN) 1000 MCG tablet Take 1,000 mcg by mouth daily.   Yes Historical Provider, MD  warfarin (COUMADIN) 5 MG tablet Take 1 tablet (5 mg total) by mouth at bedtime. As directed per INR. Please do not take on 2/26 and 2/27. Resume on 08/30/14. INR check on Monday or Tuesday. Patient taking differently: Take 5 mg by mouth at bedtime.  08/28/14  Yes Bonnielee Haff, MD  furosemide (LASIX) 40 MG tablet Take 1-2 tablets (40-80 mg total) by mouth daily. Patient not taking: Reported on 02/12/2015 08/24/14   Golden Circle,  [name]  metroNIDAZOLE (FLAGYL) 500 MG tablet Take 1 tablet (500 mg total) by mouth 3 (three) times daily. Patient not taking: Reported on 02/12/2015 02/01/15   Golden Circle, [name]   BP 136/77 mmHg  Pulse 64  Temp(Src) 98.7 F (37.1 C) (Oral)  Resp 18  SpO2 97% Physical Exam  Constitutional: She is oriented to person, place, and time. She appears well-developed and well-nourished.  HENT:  Head: Normocephalic and atraumatic.  Eyes: Right eye exhibits no discharge. Left eye exhibits no discharge.  Neck: Normal range of motion. Neck supple. No tracheal deviation present.  Cardiovascular: An irregularly irregular rhythm present. Tachycardia present.   Murmur heard. Pulmonary/Chest: Effort normal. She has rales (few crackles at the bases bilateral).  Abdominal: Soft. She exhibits no distension. There is no tenderness. There is no guarding.  Musculoskeletal: She exhibits edema (minimal lower extremities bilateral).  Neurological:  She is alert and oriented to person, place, and time. No cranial nerve deficit.  Skin: Skin is warm. No rash noted. There is pallor.  Psychiatric: She has a normal mood and affect.  Nursing note and vitals reviewed.   ED Course  Procedures (including critical care time) CRITICAL CARE Performed by: Mariea Clonts   Total critical care time: 35 min  Critical care time was exclusive of separately billable procedures and treating other patients.  Critical care was necessary to treat or prevent imminent or life-threatening deterioration.  Critical care was time spent personally by me on the following activities: development of treatment plan with patient and/or surrogate as well as nursing, discussions with consultants, evaluation of patient's response to treatment, examination of patient, obtaining history from patient or surrogate, ordering and performing treatments and interventions, ordering and review of laboratory studies, ordering and review of radiographic  studies, pulse oximetry and re-evaluation of patient's condition.    EMERGENCY DEPARTMENT [place] CARDIAC EXAM "Study: Limited Ultrasound of the heart and pericardium"  INDICATIONS:Dyspnea Multiple views of the heart and pericardium were obtained in real-time with a multi-frequency probe.  PERFORMED TM:LYYTKP  IMAGES ARCHIVED?: Yes  FINDINGS: No pericardial effusion, Decreased contractility, IVC normal and Tamponade physiology absent  LIMITATIONS:  Body habitus  VIEWS USED: Subcostal 4 chamber, Parasternal long axis, Parasternal short axis, Apical 4 chamber  and Inferior Vena Cava  INTERPRETATION: Cardiac activity present, Volume status normal and Decreased contractility  CPT Code: 54656-81 (limited transthoracic cardiac)AS  EMERGENCY DEPARTMENT [place] LUNG EXAM "Study: Limited Ultrasound of the lung and thorax"   INDICATIONS: Dyspnea  Multiple views of both lungs using sagittal orientation were obtained.  PERFORMED BY: Myself  IMAGES ARCHIVED?: Yes  FINDINGS: B lines  LIMITATIONS: Body habitus  VIEWS USED: Anterior Lung Fields and Posterior Lung Fields  INTERPRETATION: Pulmonary Edema  CPT Code: [phone] (limited transthoracic lung)   Labs Review Labs Reviewed  CBC WITH DIFFERENTIAL/PLATELET - Abnormal; Notable for the following:    RBC 2.74 (*)    Hemoglobin 7.0 (*)    HCT 22.4 (*)    MCH 25.5 (*)    All other components within normal limits  COMPREHENSIVE METABOLIC PANEL - Abnormal; Notable for the following:    Glucose, Bld 160 (*)    Calcium 8.1 (*)    Total Protein 5.7 (*)    Albumin 3.2 (*)    ALT 13 (*)    GFR calc non Af Amer 56 (*)    All other components within normal limits  PROTIME-INR - Abnormal; Notable for the following:    Prothrombin Time 35.4 (*)    INR 3.65 (*)    All other components within normal limits  BRAIN NATRIURETIC PEPTIDE - Abnormal; Notable for the following:    B Natriuretic Peptide 394.8 (*)    All other components within  normal limits  I-STAT CHEM 8, ED - Abnormal; Notable for the following:    Glucose, Bld 154 (*)    Calcium, Ion 1.03 (*)    Hemoglobin 7.1 (*)    HCT 21.0 (*)    All other components within normal limits  I-STAT TROPOININ, ED  I-STAT CG4 LACTIC ACID, ED  POC OCCULT BLOOD, ED  TYPE AND SCREEN  PREPARE RBC (CROSSMATCH)    Imaging Review Dg Chest 2 View  02/12/2015   CLINICAL DATA:  Acute onset of shortness of breath and left shoulder pain. Initial encounter.  EXAM: CHEST  2 VIEW  COMPARISON:  Chest radiograph performed  08/26/2014, and CT of the chest performed 10/20/2010  FINDINGS: The lungs are well-aerated. Minimal bibasilar atelectasis is noted. Mild peribronchial thickening is seen. Small bilateral pleural effusions are suggested. There is no evidence of pneumothorax.  The heart is normal in size; the mediastinal contour is within normal limits. A moderate hiatal hernia is again noted. No acute osseous abnormalities are seen. Cervical spinal fusion hardware is noted. Clips are noted within the right upper quadrant, reflecting prior cholecystectomy.  IMPRESSION: 1. Minimal bibasilar atelectasis and small bilateral pleural effusions. Mild peribronchial thickening noted. 2. Moderate hiatal hernia again seen.   Electronically Signed   By: Garald Balding M.D.   On: 02/12/2015 21:54   I, Omran Keelin M, personally reviewed and evaluated these images and lab results as part of my medical decision-making.   EKG Interpretation None      MDM   Final diagnoses:  Symptomatic anemia  Acute congestive heart failure, unspecified congestive heart failure type  Supratherapeutic INR  Atrial fibrillation with RVR  Acute GI bleeding   Patient presents with worsening dyspnea especially with exertion the past week. With weight gain, crackles and mild B lines and ultrasound concern for pulmonary edema. Patient had significant drop in hemoglobin on blood work, type and screen and blood ordered. Heme  occult pending.  Discussed with GI and hospitalist for admission.  Blood ordered in the ED.   The patients results and plan were reviewed and discussed.   Any x-rays performed were independently reviewed by myself.   Differential diagnosis were considered with the presenting HPI.  Medications  phytonadione (VITAMIN K) 5 mg in dextrose 5 % 50 mL IVPB (not administered)  furosemide (LASIX) injection 20 mg (not administered)  albuterol (PROVENTIL) (2.5 MG/3ML) 0.083% nebulizer solution 5 mg (5 mg Nebulization Given 02/12/15 2149)    Filed Vitals:   02/12/15 2109 02/12/15 2117 02/12/15 2205  BP:  125/76 136/77  Pulse:  78 64  Temp:   98.7 F (37.1 C)  TempSrc:   Oral  Resp:  19 18  SpO2: 96% 97% 97%    Final diagnoses:  Symptomatic anemia  Acute congestive heart failure, unspecified congestive heart failure type  Supratherapeutic INR  Atrial fibrillation with RVR  Acute GI bleeding    Admission/ observation were discussed with the admitting physician, patient and/or family and they are comfortable with the plan.      Elnora Morrison, MD 02/12/15 [phone]

## 2015-02-13 ENCOUNTER — Encounter: Payer: Self-pay | Admitting: Family

## 2015-02-13 ENCOUNTER — Encounter (HOSPITAL_COMMUNITY): Payer: Self-pay | Admitting: Internal Medicine

## 2015-02-13 ENCOUNTER — Inpatient Hospital Stay (HOSPITAL_COMMUNITY): Payer: Medicare Other

## 2015-02-13 DIAGNOSIS — K219 Gastro-esophageal reflux disease without esophagitis: Secondary | ICD-10-CM | POA: Diagnosis present

## 2015-02-13 DIAGNOSIS — K449 Diaphragmatic hernia without obstruction or gangrene: Secondary | ICD-10-CM | POA: Diagnosis not present

## 2015-02-13 DIAGNOSIS — K922 Gastrointestinal hemorrhage, unspecified: Secondary | ICD-10-CM | POA: Diagnosis present

## 2015-02-13 DIAGNOSIS — J9811 Atelectasis: Secondary | ICD-10-CM | POA: Diagnosis not present

## 2015-02-13 DIAGNOSIS — D649 Anemia, unspecified: Secondary | ICD-10-CM | POA: Diagnosis not present

## 2015-02-13 DIAGNOSIS — D123 Benign neoplasm of transverse colon: Secondary | ICD-10-CM | POA: Diagnosis not present

## 2015-02-13 DIAGNOSIS — D500 Iron deficiency anemia secondary to blood loss (chronic): Secondary | ICD-10-CM | POA: Diagnosis not present

## 2015-02-13 DIAGNOSIS — I4892 Unspecified atrial flutter: Secondary | ICD-10-CM | POA: Diagnosis present

## 2015-02-13 DIAGNOSIS — I5020 Unspecified systolic (congestive) heart failure: Secondary | ICD-10-CM | POA: Diagnosis not present

## 2015-02-13 DIAGNOSIS — J80 Acute respiratory distress syndrome: Secondary | ICD-10-CM | POA: Diagnosis not present

## 2015-02-13 DIAGNOSIS — I350 Nonrheumatic aortic (valve) stenosis: Secondary | ICD-10-CM | POA: Diagnosis present

## 2015-02-13 DIAGNOSIS — K921 Melena: Secondary | ICD-10-CM | POA: Diagnosis present

## 2015-02-13 DIAGNOSIS — I482 Chronic atrial fibrillation: Secondary | ICD-10-CM | POA: Diagnosis present

## 2015-02-13 DIAGNOSIS — I10 Essential (primary) hypertension: Secondary | ICD-10-CM | POA: Diagnosis present

## 2015-02-13 DIAGNOSIS — M1990 Unspecified osteoarthritis, unspecified site: Secondary | ICD-10-CM | POA: Diagnosis present

## 2015-02-13 DIAGNOSIS — Z825 Family history of asthma and other chronic lower respiratory diseases: Secondary | ICD-10-CM | POA: Diagnosis not present

## 2015-02-13 DIAGNOSIS — D62 Acute posthemorrhagic anemia: Secondary | ICD-10-CM | POA: Diagnosis present

## 2015-02-13 DIAGNOSIS — K222 Esophageal obstruction: Secondary | ICD-10-CM | POA: Diagnosis present

## 2015-02-13 DIAGNOSIS — Z8249 Family history of ischemic heart disease and other diseases of the circulatory system: Secondary | ICD-10-CM | POA: Diagnosis not present

## 2015-02-13 DIAGNOSIS — D124 Benign neoplasm of descending colon: Secondary | ICD-10-CM | POA: Diagnosis not present

## 2015-02-13 DIAGNOSIS — K5730 Diverticulosis of large intestine without perforation or abscess without bleeding: Secondary | ICD-10-CM | POA: Diagnosis not present

## 2015-02-13 DIAGNOSIS — Z823 Family history of stroke: Secondary | ICD-10-CM | POA: Diagnosis not present

## 2015-02-13 DIAGNOSIS — G8929 Other chronic pain: Secondary | ICD-10-CM | POA: Diagnosis present

## 2015-02-13 DIAGNOSIS — Z8041 Family history of malignant neoplasm of ovary: Secondary | ICD-10-CM | POA: Diagnosis not present

## 2015-02-13 DIAGNOSIS — J90 Pleural effusion, not elsewhere classified: Secondary | ICD-10-CM | POA: Diagnosis not present

## 2015-02-13 DIAGNOSIS — Z7901 Long term (current) use of anticoagulants: Secondary | ICD-10-CM | POA: Diagnosis not present

## 2015-02-13 DIAGNOSIS — R0602 Shortness of breath: Secondary | ICD-10-CM | POA: Diagnosis not present

## 2015-02-13 DIAGNOSIS — R791 Abnormal coagulation profile: Secondary | ICD-10-CM

## 2015-02-13 DIAGNOSIS — I5030 Unspecified diastolic (congestive) heart failure: Secondary | ICD-10-CM | POA: Diagnosis present

## 2015-02-13 DIAGNOSIS — I480 Paroxysmal atrial fibrillation: Secondary | ICD-10-CM | POA: Diagnosis not present

## 2015-02-13 LAB — COMPREHENSIVE METABOLIC PANEL
ALBUMIN: 3.2 g/dL — AB (ref 3.5–5.0)
ALK PHOS: 58 U/L (ref 38–126)
ALT: 14 U/L (ref 14–54)
ANION GAP: 6 (ref 5–15)
AST: 16 U/L (ref 15–41)
BILIRUBIN TOTAL: 1.1 mg/dL (ref 0.3–1.2)
BUN: 12 mg/dL (ref 6–20)
CALCIUM: 8.2 mg/dL — AB (ref 8.9–10.3)
CO2: 28 mmol/L (ref 22–32)
CREATININE: 0.83 mg/dL (ref 0.44–1.00)
Chloride: 106 mmol/L (ref 101–111)
GFR calc Af Amer: >60 mL/min (ref >60)
GFR calc non Af Amer: >60 mL/min (ref >60)
GLUCOSE: 134 mg/dL — AB (ref 65–99)
Potassium: 3.8 mmol/L (ref 3.5–5.1)
Sodium: 140 mmol/L (ref 135–145)
Total Protein: 5.9 g/dL — ABNORMAL LOW (ref 6.5–8.1)

## 2015-02-13 LAB — HEMATOCRIT: HEMATOCRIT: 30.9 % — AB (ref 36.0–46.0)

## 2015-02-13 LAB — CBC
HEMATOCRIT: 28.1 % — AB (ref 36.0–46.0)
Hemoglobin: 9.3 g/dL — ABNORMAL LOW (ref 12.0–15.0)
MCH: 27.4 pg (ref 26.0–34.0)
MCHC: 33.1 g/dL (ref 30.0–36.0)
MCV: 82.6 fL (ref 78.0–100.0)
PLATELETS: 126 10*3/uL — AB (ref 150–400)
RBC: 3.4 MIL/uL — AB (ref 3.87–5.11)
RDW: 13.9 % (ref 11.5–15.5)
WBC: 4.8 10*3/uL (ref 4.0–10.5)

## 2015-02-13 LAB — TROPONIN I: Troponin I: 0.03 ng/mL (ref <0.031)

## 2015-02-13 LAB — HEMOGLOBIN: HEMOGLOBIN: 9.9 g/dL — AB (ref 12.0–15.0)

## 2015-02-13 LAB — PROTIME-INR
INR: 1.39 (ref 0.00–1.49)
Prothrombin Time: 17.2 seconds — ABNORMAL HIGH (ref 11.6–15.2)

## 2015-02-13 MED ORDER — PANTOPRAZOLE SODIUM 40 MG IV SOLR
40.0000 mg | Freq: Two times a day (BID) | INTRAVENOUS | Status: DC
  Administered 2015-02-13 – 2015-02-15 (×6): 40 mg via INTRAVENOUS
  Filled 2015-02-13 (×6): qty 40

## 2015-02-13 MED ORDER — ALBUTEROL SULFATE (2.5 MG/3ML) 0.083% IN NEBU
2.5000 mg | INHALATION_SOLUTION | Freq: Four times a day (QID) | RESPIRATORY_TRACT | Status: DC
Start: 2015-02-13 — End: 2015-02-13

## 2015-02-13 MED ORDER — ALBUTEROL SULFATE (2.5 MG/3ML) 0.083% IN NEBU: 2.5000 mg | INHALATION_SOLUTION | RESPIRATORY_TRACT | Status: DC | PRN

## 2015-02-13 MED ORDER — MAGNESIUM SULFATE 2 GM/50ML IV SOLN
2.0000 g | Freq: Once | INTRAVENOUS | Status: AC
  Administered 2015-02-13: 2 g via INTRAVENOUS
  Filled 2015-02-13: qty 50

## 2015-02-13 MED ORDER — METOPROLOL TARTRATE 1 MG/ML IV SOLN
5.0000 mg | Freq: Four times a day (QID) | INTRAVENOUS | Status: DC
  Administered 2015-02-13 – 2015-02-14 (×6): 5 mg via INTRAVENOUS
  Filled 2015-02-13 (×6): qty 5

## 2015-02-13 MED ORDER — ONDANSETRON HCL 4 MG/2ML IJ SOLN
4.0000 mg | Freq: Four times a day (QID) | INTRAMUSCULAR | Status: DC | PRN
  Administered 2015-02-13 – 2015-02-16 (×3): 4 mg via INTRAVENOUS
  Filled 2015-02-13 (×3): qty 2

## 2015-02-13 MED ORDER — TRAMADOL HCL 50 MG PO TABS
50.0000 mg | ORAL_TABLET | Freq: Four times a day (QID) | ORAL | Status: DC | PRN
  Administered 2015-02-13 – 2015-02-16 (×7): 50 mg via ORAL
  Filled 2015-02-13 (×8): qty 1

## 2015-02-13 MED ORDER — ONDANSETRON HCL 4 MG PO TABS: 4.0000 mg | ORAL_TABLET | Freq: Four times a day (QID) | ORAL | Status: DC | PRN

## 2015-02-13 MED ORDER — SODIUM CHLORIDE 0.9 % IJ SOLN
3.0000 mL | Freq: Two times a day (BID) | INTRAMUSCULAR | Status: DC
  Administered 2015-02-13 – 2015-02-16 (×6): 3 mL via INTRAVENOUS

## 2015-02-13 MED ORDER — FUROSEMIDE 10 MG/ML IJ SOLN
40.0000 mg | Freq: Two times a day (BID) | INTRAMUSCULAR | Status: DC
  Administered 2015-02-13 – 2015-02-15 (×5): 40 mg via INTRAVENOUS
  Filled 2015-02-13 (×5): qty 4

## 2015-02-13 MED ORDER — TEMAZEPAM 15 MG PO CAPS
15.0000 mg | ORAL_CAPSULE | Freq: Every evening | ORAL | Status: DC | PRN
  Administered 2015-02-13 – 2015-02-16 (×5): 15 mg via ORAL
  Filled 2015-02-13 (×5): qty 1

## 2015-02-13 NOTE — Progress Notes (Addendum)
[facility] PROGRESS NOTE  DEREONNA LENSING MVE:[id number] DOB: Apr 12, 1941 DOA: 02/12/2015 PCP: Mauricio Po, [name]  Brief narrative 74 year old female with history of diastolic CHF, chronic A. fib on Coumadin, hypertension, aortic stenosis, GERD and chronic back pain presented to the ED with progressive shortness of breath for one week. Reports fatigue and some orthopnea. Reports mild epigastric pain and melanotic for past 2 weeks. Patient found to have significant drop in her hemoglobin (from 13-17 g), with supratherapeutic INR. Patient had EGD in 2011 with some erythema in the antrum large hiatal hernia (done by Dr. Amedeo Plenty). Patient denies use of NSAIDs. Admitted to telemetry for further workup and GI consulted.  Assessment/Plan: Acute blood loss anemia Likely secondary to upper GI bleed. Seen by Dr. Benson Norway who plans an EGD but given her shortness of breath recommends to postpone it until her dyspnea is better. -Plan on PRBC transfusion along with FFP to reverse INR. Coumadin on hold. Monitor H&H closely. Continue IV PPI.  Shortness of breath Possibly acute diastolic CHF. Chest x-ray showing minimal bibasilar atelectasis with small bilateral pleural effusion. Will diurese with IV Lasix 40 mg twice daily. Monitor strict I/O and daily weight. Check repeat 2-D echo. Place on albuterol med when necessary. -Continue metoprolol and statin.   Chronic A. fib On Coumadin with supratherapeutic INR. Within held and ordered for FFP. Monitor INR closely.  Essential hypertension Continue amlodipine and clonidine.  Chronic arthritis Continue tramadol  DVT prophylaxis: Supratherapeutic INR  diet: Heart healthy  Code Status: Full code Family Communication: Son at bedside Disposition Plan: Continue telemetry monitoring   Consultants:  GI (Dr. Benson Norway)  Procedures:  For EGD possibly on 8/15  2-D echo  Antibiotics:  None  HPI/Subjective: Patient seen and examined. Appears quite  short of breath and unable to full sentences. Admission H&P reviewed.  Objective: Filed Vitals:   02/13/15 1005  BP: 139/48  Pulse: 71  Temp: 98.1 F (36.7 C)  Resp:     Intake/Output Summary (Last 24 hours) at 02/13/15 1253 Last data filed at 02/13/15 0935  Gross per 24 hour  Intake   1042 ml  Output    400 ml  Net    642 ml   Filed Weights   02/13/15 0159  Weight: 59.2 kg (130 lb 8.2 oz)    Exam:   General:  Elderly female lying in bed appears short of breath and unable to speak in full sentences  HEENT: Pallor present, moist oral mucosa, not using accessory muscles of respiration  Cardiovascular: Normal S1 and S2, no murmurs rub or gallop  Respiratory: Diminished bibasilar breath sounds,  Abdomen: , Nondistended, nontender, bowel sounds present  Musculoskeletal: Warm, no edema  CNS: Alert and oriented  Data Reviewed: Basic Metabolic Panel:  Recent Labs Lab 02/12/15 2129 02/12/15 2137 02/13/15 0808  NA 140 139 140  K 4.5 4.5 3.8  CL 109 105 106  CO2 23  --  28  GLUCOSE 160* 154* 134*  BUN 13 14 12   CREATININE 0.97 1.00 0.83  CALCIUM 8.1*  --  8.2*   Liver Function Tests:  Recent Labs Lab 02/12/15 2129 02/13/15 0808  AST 22 16  ALT 13* 14  ALKPHOS 53 58  BILITOT 0.4 1.1  PROT 5.7* 5.9*  ALBUMIN 3.2* 3.2*   No results for input(s): LIPASE, AMYLASE in the last 168 hours. No results for input(s): AMMONIA in the last 168 hours. CBC:  Recent Labs Lab 02/12/15 2129 02/12/15 2137 02/13/15 1107  WBC 4.7  --  4.8  NEUTROABS 2.9  --   --   HGB 7.0* 7.1* 9.3*  HCT 22.4* 21.0* 28.1*  MCV 81.8  --  82.6  PLT 206  --  126*   Cardiac Enzymes:  Recent Labs Lab 02/13/15 0808  TROPONINI <0.03   BNP (last 3 results)  Recent Labs  02/12/15 2130  BNP 394.8*    ProBNP (last 3 results) No results for input(s): PROBNP in the last 8760 hours.  CBG: No results for input(s): GLUCAP in the last 168 hours.  No results found for this or  any previous visit (from the past 240 hour(s)).   Studies: Dg Chest 2 View  02/12/2015   CLINICAL DATA:  Acute onset of shortness of breath and left shoulder pain. Initial encounter.  EXAM: CHEST  2 VIEW  COMPARISON:  Chest radiograph performed 08/26/2014, and CT of the chest performed 10/20/2010  FINDINGS: The lungs are well-aerated. Minimal bibasilar atelectasis is noted. Mild peribronchial thickening is seen. Small bilateral pleural effusions are suggested. There is no evidence of pneumothorax.  The heart is normal in size; the mediastinal contour is within normal limits. A moderate hiatal hernia is again noted. No acute osseous abnormalities are seen. Cervical spinal fusion hardware is noted. Clips are noted within the right upper quadrant, reflecting prior cholecystectomy.  IMPRESSION: 1. Minimal bibasilar atelectasis and small bilateral pleural effusions. Mild peribronchial thickening noted. 2. Moderate hiatal hernia again seen.   Electronically Signed   By: Garald Balding M.D.   On: 02/12/2015 21:54    Scheduled Meds: . albuterol  2.5 mg Nebulization Q6H  . furosemide  40 mg Intravenous BID  . metoprolol  5 mg Intravenous 4 times per day  . pantoprazole (PROTONIX) IV  40 mg Intravenous Q12H  . sodium chloride  3 mL Intravenous Q12H   Continuous Infusions:     Time spent: 25 minutes    Nikya Busler, [place]  [facility] Pager [phone]. If 7PM-7AM, please contact night-coverage at [email or web address], password [facility] 02/13/2015, 12:53 PM  LOS: 0 days

## 2015-02-13 NOTE — H&P (Signed)
[facility] History and Physical  [name] IHK:[id number] DOB: June 20, 1941 DOA: 02/12/2015  Referring physician:Joshua Reather Converse, MD PCP: Mauricio Po, [name]   Chief Complaint: Shortness of breath.  HPI: [name] is a 74 y.o. female with past medical history of CHF, hypertension, chronic atrial fibrillation, arthritis stenosis, GERD, posterior arthritis, chronic back pain who comes to the ER due to complaints of progressively worse shortness of breath since earlier this week. She denies fever, but feels chills and fatigue. She denies chest pain, palpitations, diaphoresis, PND, but complains of dizziness, orthopnea and mild pitting edema of the lower extremities. She has been having an occasional dry cough, but denies earaches sore throat rhinorrhea. On review of systems, significant findings are decreased appetite, epigastric abdominal pain worsened by food intake and melena for the past 2 weeks. She has felt nauseous at times, but denies emesis. She denies urinary symptoms.  Workup in the ER shows a significant drop on hemoglobin from baseline around 13 g to 7 g today. Patient states that she has had anemia in the past, but to her knowledge she has never had a GI bleed. She suffers from chronic back pain and osteoarthritis, but denies the use of prescription or over-the-counter NSAIDs.  She has been treated with furosemide IV and blood transfusions in the ER and feels better.   Review of Systems:  Constitutional: Positive chills, fatigue.  No weight loss, night sweats, Fevers,  HEENT:  Occasional headaches, Denies Difficulty swallowing,Tooth/dental problems,Sore throat,  No sneezing, itching, ear ache, nasal congestion, post nasal drip,  Cardio-vascular:  Mild swelling in lower extremities, mild dizziness, positive Orthopnea. No chest pain, , PND, anasarca,  palpitations.  GI:  Positive abdominal pain, heartburn, indigestion,nausea,loss of appetite, diarrhea, melena  daily for the past 2 weeks or so. No   vomiting, No constipation.  Resp:  Positive shortness of breath with exertion. Occasional non-productive cough, No excess mucus, no productive cough,  No coughing up of blood.No change in color of mucus.No wheezing.No chest wall deformity  Skin:  no rash or lesions.  GU:  no dysuria, change in color of urine, no urgency or frequency. No flank pain.  Musculoskeletal:  Chronic back pain and frequent arthralgias.[name]  Psych:  No change in mood or affect. No depression or anxiety. No memory loss.   Past Medical History  Diagnosis Date  . CHF (congestive heart failure)   . Hypertension   . Anemia   . Broken back 2013  . Atrial fibrillation   . GERD (gastroesophageal reflux disease)   . Arthritis    Past Surgical History  Procedure Laterality Date  . Lumbar disc surgery  1991  . Lumbar back surgery  2012  . Cervical disc surgery  2014  . Cholecystectomy  1980s  . Appendectomy  1950s  . Tonsillectomy and adenoidectomy  1960s  . Carpal tunnel release Bilateral 1990s   Social History:  reports that she has never smoked. She has never used smokeless tobacco. She reports that she does not drink alcohol or use illicit drugs.  Allergies  Allergen Reactions  . Iodinated Diagnostic Agents Anaphylaxis    Info given by patient  . Milk-Related Compounds     Lactose intolerance  . Darvon [Propoxyphene] Rash  . Hydralazine Anxiety    Facial flushing, pt prefers not to use it.     Family History  Problem Relation Age of Onset  . Stroke Mother   . Heart disease Mother   . Emphysema Father   .  Ovarian cancer Sister   . Stroke Sister   . Other Child     died at birth    Prior to Admission medications   Medication Sig Start Date End Date Taking? Authorizing Provider  amLODipine (NORVASC) 10 MG tablet Take 1 tablet (10 mg total) by mouth daily. 08/03/14  Yes Golden Circle, [name]  butalbital-acetaminophen-caffeine (FIORICET, ESGIC) [phone] MG per  tablet Take 1 tablet by mouth every 4 (four) hours as needed for headache. 08/28/14  Yes Bonnielee Haff, MD  cloNIDine (CATAPRES) 0.1 MG tablet Take 1 tablet (0.1 mg total) by mouth daily. Patient taking differently: Take 0.1 mg by mouth daily as needed (high blood pressure, will take if levels are 180/90).  08/28/14  Yes Bonnielee Haff, MD  gabapentin (NEURONTIN) 100 MG capsule Take 1 capsule (100 mg total) by mouth 3 (three) times daily. 11/13/14  Yes Golden Circle, [name]  meclizine (ANTIVERT) 25 MG tablet Take 1 tablet (25 mg total) by mouth 3 (three) times daily as needed for dizziness. 08/28/14  Yes Bonnielee Haff, MD  metoCLOPramide (REGLAN) 10 MG tablet Take 10 mg by mouth every 6 (six) hours as needed for nausea.   Yes Historical Provider, MD  metoprolol (LOPRESSOR) 25 MG tablet Take 1 tablet (25 mg total) by mouth 2 (two) times daily. 08/28/14  Yes Bonnielee Haff, MD  ondansetron (ZOFRAN) 4 MG tablet Take 1 tablet (4 mg total) by mouth every 8 (eight) hours as needed for nausea or vomiting. 02/01/15  Yes Golden Circle, [name]  oxycodone (OXY-IR) 5 MG capsule Take 1 capsule (5 mg total) by mouth 2 (two) times daily as needed for pain. 08/03/14  Yes Golden Circle, [name]  pantoprazole (PROTONIX) 40 MG tablet Take 1 tablet (40 mg total) by mouth daily. 02/01/15  Yes Golden Circle, [name]  polyethylene glycol (MIRALAX / GLYCOLAX) packet Take 17 g by mouth daily as needed for moderate constipation. 08/28/14  Yes Bonnielee Haff, MD  simvastatin (ZOCOR) 20 MG tablet Take 1 tablet (20 mg total) by mouth daily. 11/13/14  Yes Golden Circle, [name]  temazepam (RESTORIL) 15 MG capsule Take 1 capsule (15 mg total) by mouth at bedtime. 11/13/14  Yes Golden Circle, [name]  tizanidine (ZANAFLEX) 2 MG capsule Take 2 mg by mouth every 6 (six) hours as needed for muscle spasms.   Yes Historical Provider, MD  traMADol (ULTRAM) 50 MG tablet Take 1 tablet (50 mg total) by mouth every 6 (six) hours as needed. Patient taking  differently: Take 50 mg by mouth every 6 (six) hours as needed for moderate pain.  01/15/15  Yes Golden Circle, [name]  valsartan-hydrochlorothiazide (DIOVAN-HCT) 320-25 MG per tablet Take 1 tablet by mouth daily. 09/14/14  Yes Pixie Casino, MD  vitamin B-12 (CYANOCOBALAMIN) 1000 MCG tablet Take 1,000 mcg by mouth daily.   Yes Historical Provider, MD  warfarin (COUMADIN) 5 MG tablet Take 1 tablet (5 mg total) by mouth at bedtime. As directed per INR. Please do not take on 2/26 and 2/27. Resume on 08/30/14. INR check on Monday or Tuesday. Patient taking differently: Take 5 mg by mouth at bedtime.  08/28/14  Yes Bonnielee Haff, MD  furosemide (LASIX) 40 MG tablet Take 1-2 tablets (40-80 mg total) by mouth daily. Patient not taking: Reported on 02/12/2015 08/24/14   Golden Circle, [name]  metroNIDAZOLE (FLAGYL) 500 MG tablet Take 1 tablet (500 mg total) by mouth 3 (three) times daily. Patient not taking: Reported on 02/12/2015  02/01/15   Golden Circle, [name]   Physical Exam: Filed Vitals:   02/12/15 2230 02/12/15 2300 02/13/15 0014 02/13/15 0030  BP: 132/70 148/59 167/47 140/54  Pulse: 137 73 106 88  Temp:   98.7 F (37.1 C)   TempSrc:   Oral   Resp: 22 20 19    SpO2: 93% 95% 99% 98%    Wt Readings from Last 3 Encounters:  02/01/15 59.421 kg (131 lb)  12/15/14 60.238 kg (132 lb 12.8 oz)  10/15/14 60.782 kg (134 lb)    General:  Appears calm and comfortable Eyes: PERRL, normal lids, irises & conjunctiva ENT: grossly normal hearing, lips & tongue mildly dry. Neck: no LAD, masses or thyromegaly Cardiovascular: Irregularly irregular, normal rate, positive systolic ejection murmur.                                  No LE edema (patient received furosemide earlier). Telemetry: Atrial fibrillation with rate controlled. Respiratory: CTA bilaterally, no w/r/r. Normal respiratory effort. Abdomen: Bowel sounds positive, soft, positive epigastric tenderness, no guarding, no rebound tenderness. Skin:  no rash or induration seen on limited exam Musculoskeletal: grossly normal tone BUE/BLE Psychiatric: grossly normal mood and affect, speech fluent and appropriate Neurologic: grossly non-focal.          Labs on Admission:  Basic Metabolic Panel:  Recent Labs Lab 02/12/15 2129 02/12/15 2137  NA 140 139  K 4.5 4.5  CL 109 105  CO2 23  --   GLUCOSE 160* 154*  BUN 13 14  CREATININE 0.97 1.00  CALCIUM 8.1*  --    Liver Function Tests:  Recent Labs Lab 02/12/15 2129  AST 22  ALT 13*  ALKPHOS 53  BILITOT 0.4  PROT 5.7*  ALBUMIN 3.2*   No results for input(s): LIPASE, AMYLASE in the last 168 hours. No results for input(s): AMMONIA in the last 168 hours. CBC:  Recent Labs Lab 02/12/15 2129 02/12/15 2137  WBC 4.7  --   NEUTROABS 2.9  --   HGB 7.0* 7.1*  HCT 22.4* 21.0*  MCV 81.8  --   PLT 206  --    Cardiac Enzymes: No results for input(s): CKTOTAL, CKMB, CKMBINDEX, TROPONINI in the last 168 hours.  BNP (last 3 results)  Recent Labs  02/12/15 2130  BNP 394.8*    ProBNP (last 3 results) No results for input(s): PROBNP in the last 8760 hours.  CBG: No results for input(s): GLUCAP in the last 168 hours.  Radiological Exams on Admission: Dg Chest 2 View  02/12/2015   CLINICAL DATA:  Acute onset of shortness of breath and left shoulder pain. Initial encounter.  EXAM: CHEST  2 VIEW  COMPARISON:  Chest radiograph performed 08/26/2014, and CT of the chest performed 10/20/2010  FINDINGS: The lungs are well-aerated. Minimal bibasilar atelectasis is noted. Mild peribronchial thickening is seen. Small bilateral pleural effusions are suggested. There is no evidence of pneumothorax.  The heart is normal in size; the mediastinal contour is within normal limits. A moderate hiatal hernia is again noted. No acute osseous abnormalities are seen. Cervical spinal fusion hardware is noted. Clips are noted within the right upper quadrant, reflecting prior cholecystectomy.   IMPRESSION: 1. Minimal bibasilar atelectasis and small bilateral pleural effusions. Mild peribronchial thickening noted. 2. Moderate hiatal hernia again seen.   Electronically Signed   By: Garald Balding M.D.   On: 02/12/2015 21:54  EKG: Independently reviewed. Vent. rate 104 BPM PR interval * ms QRS duration 77 ms QT/QTc 365/480 ms P-R-T axes -1 48 71 Atrial fibrillation Minimal ST depression, diffuse leads.  Assessment/Plan Principal Problem:   UGI bleed Active Problems:   PAF (paroxysmal atrial fibrillation)   CHF (congestive heart failure)   HTN (hypertension)   Aortic stenosis   Sleep disturbance   Supratherapeutic INR   Code Status: Full code. DVT Prophylaxis: SCDs. Family Communication: Patient was able to provide information by herself and there were no relatives present at the time of admission. This is the contact information on the face sheet. Leodis Binet   [phone]  Disposition Plan: Admit to telemetry, transfuse packed RBCs and fresh frozen plasma, GI was notified by the emergency department. Expect discharge in 2-3 days after evaluated by GI.  Time spent: Over 70 minutes.  Reubin Milan [facility] Pager [phone].

## 2015-02-13 NOTE — Consult Note (Signed)
Consult for [facility]  Reason for Consult: Anemia. Referring Physician: [facility]  Arnette Norris HPI: This is a 74 year old female with a PMH of CHF, chronic afib, GERD, HTN, aortic stenosis, and chronic back pain admitted for SOB.  Further evaluation in the ER reveals that her HGB dropped down from 13 to the 7 range.  In 2011 she underwent an EGD with Dr. Amedeo Plenty with findings of some erythema in the antrum and a large hiatal hernia.  No overt bleeding site identified.  A CT scan of the ABM in the recent past remarked a few samll scattered sigmoid diverticula.  She is on chronic anticoagulation and her INR is at 3.65.  Last week she had some issues with diarrhea and then she had multiple bouts of melena.  She denies taking any Peptobismol.  Since her admission, her SOB has improved, and she feels better.  However, she still has issues with SOB.  Her Echo last year revealed an EF of 65-70% and a mild aortic stenosis, despite the heavily calcified leaflets.  Past Medical History  Diagnosis Date  . CHF (congestive heart failure)   . Hypertension   . Anemia   . Broken back 2013  . Atrial fibrillation   . GERD (gastroesophageal reflux disease)   . Arthritis     Past Surgical History  Procedure Laterality Date  . Lumbar disc surgery  1991  . Lumbar back surgery  2012  . Cervical disc surgery  2014  . Cholecystectomy  1980s  . Appendectomy  1950s  . Tonsillectomy and adenoidectomy  1960s  . Carpal tunnel release Bilateral 1990s    Family History  Problem Relation Age of Onset  . Stroke Mother   . Heart disease Mother   . Emphysema Father   . Ovarian cancer Sister   . Stroke Sister   . Other Child     died at birth    Social History:  reports that she has never smoked. She has never used smokeless tobacco. She reports that she does not drink alcohol or use illicit drugs.  Allergies:  Allergies  Allergen Reactions  . Iodinated Diagnostic Agents Anaphylaxis    Info  given by patient  . Milk-Related Compounds     Lactose intolerance  . Darvon [Propoxyphene] Rash  . Hydralazine Anxiety    Facial flushing, pt prefers not to use it.     Medications:  Scheduled: . metoprolol  5 mg Intravenous 4 times per day  . pantoprazole (PROTONIX) IV  40 mg Intravenous Q12H  . sodium chloride  3 mL Intravenous Q12H   Continuous:   Results for orders placed or performed during the hospital encounter of 02/12/15 (from the past 24 hour(s))  CBC with Differential     Status: Abnormal   Collection Time: 02/12/15  9:29 PM  Result Value Ref Range   WBC 4.7 4.0 - 10.5 K/uL   RBC 2.74 (L) 3.87 - 5.11 MIL/uL   Hemoglobin 7.0 (L) 12.0 - 15.0 g/dL   HCT 22.4 (L) 36.0 - 46.0 %   MCV 81.8 78.0 - 100.0 fL   MCH 25.5 (L) 26.0 - 34.0 pg   MCHC 31.3 30.0 - 36.0 g/dL   RDW 14.2 11.5 - 15.5 %   Platelets 206 150 - 400 K/uL   Neutrophils Relative % 62 43 - 77 %   Neutro Abs 2.9 1.7 - 7.7 K/uL   Lymphocytes Relative 24 12 - 46 %   Lymphs Abs  1.1 0.7 - 4.0 K/uL   Monocytes Relative 12 3 - 12 %   Monocytes Absolute 0.5 0.1 - 1.0 K/uL   Eosinophils Relative 2 0 - 5 %   Eosinophils Absolute 0.1 0.0 - 0.7 K/uL   Basophils Relative 0 0 - 1 %   Basophils Absolute 0.0 0.0 - 0.1 K/uL  Comprehensive metabolic panel     Status: Abnormal   Collection Time: 02/12/15  9:29 PM  Result Value Ref Range   Sodium 140 135 - 145 mmol/L   Potassium 4.5 3.5 - 5.1 mmol/L   Chloride 109 101 - 111 mmol/L   CO2 23 22 - 32 mmol/L   Glucose, Bld 160 (H) 65 - 99 mg/dL   BUN 13 6 - 20 mg/dL   Creatinine, Ser 0.97 0.44 - 1.00 mg/dL   Calcium 8.1 (L) 8.9 - 10.3 mg/dL   Total Protein 5.7 (L) 6.5 - 8.1 g/dL   Albumin 3.2 (L) 3.5 - 5.0 g/dL   AST 22 15 - 41 U/L   ALT 13 (L) 14 - 54 U/L   Alkaline Phosphatase 53 38 - 126 U/L   Total Bilirubin 0.4 0.3 - 1.2 mg/dL   GFR calc non Af Amer 56 (L) >60 mL/min   GFR calc Af Amer >60 >60 mL/min   Anion gap 8 5 - 15  Protime-INR     Status: Abnormal    Collection Time: 02/12/15  9:29 PM  Result Value Ref Range   Prothrombin Time 35.4 (H) 11.6 - 15.2 seconds   INR 3.65 (H) 0.00 - 1.49  Brain natriuretic peptide     Status: Abnormal   Collection Time: 02/12/15  9:30 PM  Result Value Ref Range   B Natriuretic Peptide 394.8 (H) 0.0 - 100.0 pg/mL  I-Stat Troponin, ED (not at [facility])     Status: None   Collection Time: 02/12/15  9:37 PM  Result Value Ref Range   Troponin i, poc 0.00 0.00 - 0.08 ng/mL   Comment 3          I-Stat Chem 8, ED     Status: Abnormal   Collection Time: 02/12/15  9:37 PM  Result Value Ref Range   Sodium 139 135 - 145 mmol/L   Potassium 4.5 3.5 - 5.1 mmol/L   Chloride 105 101 - 111 mmol/L   BUN 14 6 - 20 mg/dL   Creatinine, Ser 1.00 0.44 - 1.00 mg/dL   Glucose, Bld 154 (H) 65 - 99 mg/dL   Calcium, Ion 1.03 (L) 1.13 - 1.30 mmol/L   TCO2 24 0 - 100 mmol/L   Hemoglobin 7.1 (L) 12.0 - 15.0 g/dL   HCT 21.0 (L) 36.0 - 46.0 %  I-Stat CG4 Lactic Acid, ED     Status: None   Collection Time: 02/12/15  9:38 PM  Result Value Ref Range   Lactic Acid, Venous 1.30 0.5 - 2.0 mmol/L  Type and screen     Status: None (Preliminary result)   Collection Time: 02/12/15 10:33 PM  Result Value Ref Range   ABO/RH(D) A POS    Antibody Screen NEG    Sample Expiration 02/15/2015    Unit Number [id number]    Blood Component Type RED CELLS,LR    Unit division 00    Status of Unit ISSUED    Transfusion Status OK TO TRANSFUSE    Crossmatch Result Compatible    Unit Number [id number]    Blood Component Type RED CELLS,LR    Unit  division 00    Status of Unit ISSUED    Transfusion Status OK TO TRANSFUSE    Crossmatch Result Compatible   Prepare RBC     Status: None   Collection Time: 02/12/15 10:33 PM  Result Value Ref Range   Order Confirmation ORDER PROCESSED BY BLOOD BANK   Prepare fresh frozen plasma     Status: None (Preliminary result)   Collection Time: 02/12/15 11:52 PM  Result Value Ref Range   Unit Number  [id number]    Blood Component Type FFPT, PHER 1    Unit division 00    Status of Unit ISSUED    Transfusion Status OK TO TRANSFUSE    Unit Number [id number]    Blood Component Type THAWED PLASMA    Unit division 00    Status of Unit ISSUED    Transfusion Status OK TO TRANSFUSE      Dg Chest 2 View  02/12/2015   CLINICAL DATA:  Acute onset of shortness of breath and left shoulder pain. Initial encounter.  EXAM: CHEST  2 VIEW  COMPARISON:  Chest radiograph performed 08/26/2014, and CT of the chest performed 10/20/2010  FINDINGS: The lungs are well-aerated. Minimal bibasilar atelectasis is noted. Mild peribronchial thickening is seen. Small bilateral pleural effusions are suggested. There is no evidence of pneumothorax.  The heart is normal in size; the mediastinal contour is within normal limits. A moderate hiatal hernia is again noted. No acute osseous abnormalities are seen. Cervical spinal fusion hardware is noted. Clips are noted within the right upper quadrant, reflecting prior cholecystectomy.  IMPRESSION: 1. Minimal bibasilar atelectasis and small bilateral pleural effusions. Mild peribronchial thickening noted. 2. Moderate hiatal hernia again seen.   Electronically Signed   By: Garald Balding M.D.   On: 02/12/2015 21:54    ROS:  As stated above in the HPI otherwise negative.  Blood pressure 120/55, pulse 65, temperature 98 F (36.7 C), temperature source Oral, resp. rate 18, height 5\' 2"  (1.575 m), weight 59.2 kg (130 lb 8.2 oz), SpO2 99 %.    PE: Gen: NAD, Alert and Oriented HEENT:  [place]/AT, EOMI Neck: Supple, no LAD Lungs: CTA Bilaterally CV: RRR without M/G/R ABM: Soft, NTND, +BS Ext: No C/C/E  Assessment/Plan: 1) Melena. 2) Anemia. 3) CHF with SOB.   She has responded to diuresis, but she is still markedly SOB.  Her lungs are clear to auscultation, but she cannot complete a sentence without struggling for breath.  An EGD is in order for her, but she is not clinically  stable at this time.  With the large hiatal hernia and her anticoagulation she may have Cameron's lesions.  She reports taking her PPI consistently in the mornings before breakfast.  Plan: 1) Continue with diuresis. 2) Agree with FFP. 3) EGD when clinically stable.  Johndavid Geralds D 02/13/2015, 8:31 AM

## 2015-02-14 ENCOUNTER — Inpatient Hospital Stay (HOSPITAL_COMMUNITY): Payer: Medicare Other

## 2015-02-14 DIAGNOSIS — I480 Paroxysmal atrial fibrillation: Secondary | ICD-10-CM

## 2015-02-14 DIAGNOSIS — R0602 Shortness of breath: Secondary | ICD-10-CM

## 2015-02-14 LAB — PREPARE FRESH FROZEN PLASMA
UNIT DIVISION: 0
Unit division: 0

## 2015-02-14 LAB — BLOOD GAS, ARTERIAL
ACID-BASE EXCESS: 7.3 mmol/L — AB (ref 0.0–2.0)
Bicarbonate: 31.2 mEq/L — ABNORMAL HIGH (ref 20.0–24.0)
FIO2: 0.21
O2 Saturation: 96.3 %
Patient temperature: 98.6
TCO2: 28 mmol/L (ref 0–100)
pCO2 arterial: 42.2 mmHg (ref 35.0–45.0)
pH, Arterial: 7.482 — ABNORMAL HIGH (ref 7.350–7.450)
pO2, Arterial: 81.6 mmHg (ref 80.0–100.0)

## 2015-02-14 LAB — TYPE AND SCREEN
ABO/RH(D): A POS
Antibody Screen: NEGATIVE
UNIT DIVISION: 0
UNIT DIVISION: 0

## 2015-02-14 MED ORDER — METOPROLOL TARTRATE 25 MG PO TABS
25.0000 mg | ORAL_TABLET | Freq: Two times a day (BID) | ORAL | Status: DC
  Administered 2015-02-14 – 2015-02-16 (×6): 25 mg via ORAL
  Filled 2015-02-14 (×6): qty 1

## 2015-02-14 MED ORDER — ALPRAZOLAM 0.5 MG PO TABS: 0.5000 mg | ORAL_TABLET | Freq: Two times a day (BID) | ORAL | Status: DC | PRN

## 2015-02-14 NOTE — Progress Notes (Signed)
Md was made aware of pt heart rate is now Atrial Fib

## 2015-02-14 NOTE — Progress Notes (Signed)
Subjective: No significant change in her SOB.  She feels the most SOB with speaking.  Objective: Vital signs in last 24 hours: Temp:  [98 F (36.7 C)-99.7 F (37.6 C)] 98 F (36.7 C) (08/14 0454) Pulse Rate:  [62-78] 64 (08/14 0506) Resp:  [16-20] 20 (08/14 0454) BP: (110-157)/(43-96) 146/52 mmHg (08/14 0454) SpO2:  [94 %-100 %] 94 % (08/14 0506) Weight:  [59.5 kg (131 lb 2.8 oz)-60.646 kg (133 lb 11.2 oz)] 59.5 kg (131 lb 2.8 oz) (08/14 0454) Last BM Date: 02/12/15  Intake/Output from previous day: 08/13 0701 - 08/14 0700 In: 653 [P.O.:360; I.V.:263; Blood:30] Out: 1450 [Urine:1450] Intake/Output this shift:    General appearance: alert and no distress Resp: clear to auscultation bilaterally Cardio: irregularly irregular rhythm GI: soft, non-tender; bowel sounds normal; no masses,  no organomegaly  Lab Results:  Recent Labs  02/12/15 2129 02/12/15 2137 02/13/15 1107 02/13/15 1712  WBC 4.7  --  4.8  --   HGB 7.0* 7.1* 9.3* 9.9*  HCT 22.4* 21.0* 28.1* 30.9*  PLT 206  --  126*  --    BMET  Recent Labs  02/12/15 2129 02/12/15 2137 02/13/15 0808  NA 140 139 140  K 4.5 4.5 3.8  CL 109 105 106  CO2 23  --  28  GLUCOSE 160* 154* 134*  BUN 13 14 12   CREATININE 0.97 1.00 0.83  CALCIUM 8.1*  --  8.2*   LFT  Recent Labs  02/13/15 0808  PROT 5.9*  ALBUMIN 3.2*  AST 16  ALT 14  ALKPHOS 58  BILITOT 1.1   PT/INR  Recent Labs  02/12/15 2129 02/13/15 1107  LABPROT 35.4* 17.2*  INR 3.65* 1.39   Hepatitis Panel No results for input(s): HEPBSAG, HCVAB, HEPAIGM, HEPBIGM in the last 72 hours. C-Diff No results for input(s): CDIFFTOX in the last 72 hours. Fecal Lactopherrin No results for input(s): FECLLACTOFRN in the last 72 hours.  Studies/Results: Dg Chest 2 View  02/12/2015   CLINICAL DATA:  Acute onset of shortness of breath and left shoulder pain. Initial encounter.  EXAM: CHEST  2 VIEW  COMPARISON:  Chest radiograph performed 08/26/2014, and CT  of the chest performed 10/20/2010  FINDINGS: The lungs are well-aerated. Minimal bibasilar atelectasis is noted. Mild peribronchial thickening is seen. Small bilateral pleural effusions are suggested. There is no evidence of pneumothorax.  The heart is normal in size; the mediastinal contour is within normal limits. A moderate hiatal hernia is again noted. No acute osseous abnormalities are seen. Cervical spinal fusion hardware is noted. Clips are noted within the right upper quadrant, reflecting prior cholecystectomy.  IMPRESSION: 1. Minimal bibasilar atelectasis and small bilateral pleural effusions. Mild peribronchial thickening noted. 2. Moderate hiatal hernia again seen.   Electronically Signed   By: Garald Balding M.D.   On: 02/12/2015 21:54    Medications:  Scheduled: . furosemide  40 mg Intravenous BID  . metoprolol  5 mg Intravenous 4 times per day  . pantoprazole (PROTONIX) IV  40 mg Intravenous Q12H  . sodium chloride  3 mL Intravenous Q12H   Continuous:   Assessment/Plan: 1) Melena. 2) Anemia. 3) SOB. 4) Aortic stenosis.   Her SOB with speech has not changed.  Her lungs are clear to ascultation bilaterally.  HGB is stable.  She does require an EGD, but I will defer the timing of the EGD with Dr. Deatra Ina.  Plan: 1) ? EGD in the AM. 2) NPO after midnight for the possibility of the  EGD.   LOS: 1 day   Daryan Buell D 02/14/2015, 8:31 AM

## 2015-02-14 NOTE — Progress Notes (Addendum)
[facility] PROGRESS NOTE  RANA HOCHSTEIN KVQ:[id number] DOB: 08/25/1940 DOA: 02/12/2015 PCP: Mauricio Po, [name]  Brief narrative 74 year old female with history of diastolic CHF, chronic A. fib on Coumadin, hypertension, aortic stenosis, GERD and chronic back pain presented to the ED with progressive shortness of breath for one week. Reports fatigue and some orthopnea. Reports mild epigastric pain and melanotic for past 2 weeks. Patient found to have significant drop in her hemoglobin (from 13-17 g), with supratherapeutic INR. Patient had EGD in 2011 with some erythema in the antrum large hiatal hernia (done by Dr. Amedeo Plenty). Patient denies use of NSAIDs. Admitted to telemetry for further workup and GI consulted.  Assessment/Plan: Acute blood loss anemia Likely secondary to upper GI bleed. Seen by Dr. Benson Norway who plans an EGD but given her shortness of breath recommends to postpone it until her dyspnea is better. Dr. Deatra Ina will see her tomorrow. -IVC and FFP transfused Coumadin on hold. Monitor H&H closely. Continue IV PPI.  Shortness of breath No clear etiology. Chest x-ray showed minimal bibasilar atelectasis with bilateral mild pleural effusion. 2-D echo with normal EF, no wall motion monthly, mild AS. O2 sat in the 90s on room air. ABG done seems unremarkable. Given her supratherapeutic INR I have low suspicion for PE. However given her persistent shortness of breath with inability to speak in full sentences I will obtain a CT  chest without contrast to rule out any lung abnormality (patient allergy to contrast) -Possible for underlying anxiety. Will place on when necessary Xanax. -Continue metoprolol and statin.   Chronic A. fib On Coumadin with supratherapeutic INR. Coumadin has been held and reversed with FFP. Follow-up INR normal. Patient going into a flutter but rate controlled. Resume home dose metoprolol   Essential hypertension Continue amlodipine and clonidine.  Chronic  arthritis Continue tramadol  DVT prophylaxis: SCDs  diet: Heart healthy  Code Status: Full code Family Communication: Family at bedside Disposition Plan: Continue telemetry monitoring   Consultants:  GI (Dr. Benson Norway)  Procedures:  For EGD possibly on 8/15  2-D echo  Antibiotics:  None  HPI/Subjective: Patient seen and examined. Appears quite short of breath and unable to full sentences. Admission H&P reviewed.  Objective: Filed Vitals:   02/14/15 1118  BP: 144/56  Pulse: 68  Temp:   Resp:     Intake/Output Summary (Last 24 hours) at 02/14/15 1145 Last data filed at 02/14/15 0700  Gross per 24 hour  Intake    370 ml  Output   1450 ml  Net  -1080 ml   Filed Weights   02/13/15 0159 02/13/15 1444 02/14/15 0454  Weight: 59.2 kg (130 lb 8.2 oz) 60.646 kg (133 lb 11.2 oz) 59.5 kg (131 lb 2.8 oz)    Exam:   General:  Still appears short of breath and unable to speak in full sentences  HEENT: Pallor present, moist oral mucosa, not using accessory muscles of respiration  Cardiovascular: None S2 with irregularly irregular, no murmurs rub or gallop  Respiratory: Diminished bibasilar breath sounds,  Abdomen: , Nondistended, nontender, bowel sounds present  Musculoskeletal: Warm, no edema  CNS: Alert and oriented  Data Reviewed: Basic Metabolic Panel:  Recent Labs Lab 02/12/15 2129 02/12/15 2137 02/13/15 0808  NA 140 139 140  K 4.5 4.5 3.8  CL 109 105 106  CO2 23  --  28  GLUCOSE 160* 154* 134*  BUN 13 14 12   CREATININE 0.97 1.00 0.83  CALCIUM 8.1*  --  8.2*  Liver Function Tests:  Recent Labs Lab 02/12/15 2129 02/13/15 0808  AST 22 16  ALT 13* 14  ALKPHOS 53 58  BILITOT 0.4 1.1  PROT 5.7* 5.9*  ALBUMIN 3.2* 3.2*   No results for input(s): LIPASE, AMYLASE in the last 168 hours. No results for input(s): AMMONIA in the last 168 hours. CBC:  Recent Labs Lab 02/12/15 2129 02/12/15 2137 02/13/15 1107 02/13/15 1712  WBC 4.7  --  4.8   --   NEUTROABS 2.9  --   --   --   HGB 7.0* 7.1* 9.3* 9.9*  HCT 22.4* 21.0* 28.1* 30.9*  MCV 81.8  --  82.6  --   PLT 206  --  126*  --    Cardiac Enzymes:  Recent Labs Lab 02/13/15 0808 02/13/15 1416  TROPONINI <0.03 0.03   BNP (last 3 results)  Recent Labs  02/12/15 2130  BNP 394.8*    ProBNP (last 3 results) No results for input(s): PROBNP in the last 8760 hours.  CBG: No results for input(s): GLUCAP in the last 168 hours.  No results found for this or any previous visit (from the past 240 hour(s)).   Studies: Dg Chest 2 View  02/12/2015   CLINICAL DATA:  Acute onset of shortness of breath and left shoulder pain. Initial encounter.  EXAM: CHEST  2 VIEW  COMPARISON:  Chest radiograph performed 08/26/2014, and CT of the chest performed 10/20/2010  FINDINGS: The lungs are well-aerated. Minimal bibasilar atelectasis is noted. Mild peribronchial thickening is seen. Small bilateral pleural effusions are suggested. There is no evidence of pneumothorax.  The heart is normal in size; the mediastinal contour is within normal limits. A moderate hiatal hernia is again noted. No acute osseous abnormalities are seen. Cervical spinal fusion hardware is noted. Clips are noted within the right upper quadrant, reflecting prior cholecystectomy.  IMPRESSION: 1. Minimal bibasilar atelectasis and small bilateral pleural effusions. Mild peribronchial thickening noted. 2. Moderate hiatal hernia again seen.   Electronically Signed   By: Garald Balding M.D.   On: 02/12/2015 21:54    Scheduled Meds: . furosemide  40 mg Intravenous BID  . metoprolol  5 mg Intravenous 4 times per day  . pantoprazole (PROTONIX) IV  40 mg Intravenous Q12H  . sodium chloride  3 mL Intravenous Q12H   Continuous Infusions:     Time spent: 25 minutes    Eland Lamantia, [place]  [facility] Pager [phone]. If 7PM-7AM, please contact night-coverage at [email or web address], password [facility] 02/14/2015, 11:45 AM  LOS: 1 day

## 2015-02-15 ENCOUNTER — Encounter (HOSPITAL_COMMUNITY)
Admission: EM | Disposition: A | Discharge status: Home Health | Payer: Self-pay | Source: Home / Self Care | Attending: Internal Medicine

## 2015-02-15 ENCOUNTER — Encounter (HOSPITAL_COMMUNITY): Payer: Self-pay | Admitting: Certified Registered"

## 2015-02-15 ENCOUNTER — Encounter (HOSPITAL_COMMUNITY): Payer: Self-pay

## 2015-02-15 LAB — CBC
HEMATOCRIT: 36.9 % (ref 36.0–46.0)
HEMOGLOBIN: 11.7 g/dL — AB (ref 12.0–15.0)
MCH: 25.9 pg — AB (ref 26.0–34.0)
MCHC: 31.7 g/dL (ref 30.0–36.0)
MCV: 81.6 fL (ref 78.0–100.0)
Platelets: 199 10*3/uL (ref 150–400)
RBC: 4.52 MIL/uL (ref 3.87–5.11)
RDW: 14.3 % (ref 11.5–15.5)
WBC: 6.4 10*3/uL (ref 4.0–10.5)

## 2015-02-15 SURGERY — CANCELLED PROCEDURE

## 2015-02-15 SURGERY — ENTEROSCOPY
Anesthesia: Monitor Anesthesia Care

## 2015-02-15 MED ORDER — PROPOFOL 10 MG/ML IV BOLUS
INTRAVENOUS | Status: AC
  Filled 2015-02-15: qty 20

## 2015-02-15 MED ORDER — PANTOPRAZOLE SODIUM 40 MG PO TBEC
40.0000 mg | DELAYED_RELEASE_TABLET | Freq: Two times a day (BID) | ORAL | Status: DC
  Administered 2015-02-15 – 2015-02-16 (×3): 40 mg via ORAL
  Filled 2015-02-15 (×3): qty 1

## 2015-02-15 MED ORDER — LACTATED RINGERS IV SOLN
INTRAVENOUS | Status: DC
  Administered 2015-02-15: 1000 mL via INTRAVENOUS

## 2015-02-15 MED ORDER — SODIUM CHLORIDE 0.9 % IV SOLN: INTRAVENOUS | Status: DC

## 2015-02-15 SURGICAL SUPPLY — 15 items

## 2015-02-15 NOTE — Progress Notes (Signed)
Notified by monitor tech that patient converted to sinus rhythm around 19:00 tonight.  EKG performed to verify rhythm change.  Will continue to monitor patient.  Owens Shark, Marlowe Lawes Cherie

## 2015-02-15 NOTE — Progress Notes (Signed)
Patient back in room from Endo, EGD cancelled until tomorrow per report  Meal ordered, patient in bed resting. Will continue to F/U with plan of care.

## 2015-02-15 NOTE — Progress Notes (Signed)
[facility] Progress Note  Subjective:   Less dyspneic. Says she is feeling much better.  Reports she has had EGD abd pill cam between [place] , [place] and [place] over the past few years for continued GIB, and no source has been identified. Hgb 11.7 this morning.   Objective:  Vital signs in last 24 hours: Temp:  [98.2 F (36.8 C)] 98.2 F (36.8 C) (08/15 0636) Pulse Rate:  [68-90] 84 (08/15 0636) Resp:  [20] 20 (08/15 0636) BP: (110-144)/(54-62) 110/54 mmHg (08/15 0636) SpO2:  [95 %-98 %] 96 % (08/15 0636) Weight:  [126 lb 8.7 oz (57.4 kg)] 126 lb 8.7 oz (57.4 kg) (08/15 0636) Last BM Date: 02/12/15 General:   Alert,  Well-developed,    in NAD Heart:  Regular rate and rhythm; no murmurs Pulm;lungs clear Abdomen:  Soft, nontender and nondistended. Normal bowel sounds, without guarding, and without rebound.   Extremities:  Without edema. Neurologic: Alert and  oriented x4;  grossly normal neurologically. Psych: Alert and cooperative. Normal mood and affect.  Intake/Output from previous day: 08/14 0701 - 08/15 0700 In: 480 [P.O.:480] Out: 1000 [Urine:1000] Intake/Output this shift: Total I/O In: -  Out: 100 [Urine:100]  Lab Results:  Recent Labs  02/12/15 2129  02/13/15 1107 02/13/15 1712 02/15/15 0448  WBC 4.7  --  4.8  --  6.4  HGB 7.0*  < > 9.3* 9.9* 11.7*  HCT 22.4*  < > 28.1* 30.9* 36.9  PLT 206  --  126*  --  199  < > = values in this interval not displayed. BMET  Recent Labs  02/12/15 2129 02/12/15 2137 02/13/15 0808  NA 140 139 140  K 4.5 4.5 3.8  CL 109 105 106  CO2 23  --  28  GLUCOSE 160* 154* 134*  BUN 13 14 12   CREATININE 0.97 1.00 0.83  CALCIUM 8.1*  --  8.2*   LFT  Recent Labs  02/13/15 0808  PROT 5.9*  ALBUMIN 3.2*  AST 16  ALT 14  ALKPHOS 58  BILITOT 1.1   PT/INR  Recent Labs  02/12/15 2129 02/13/15 1107  LABPROT 35.4* 17.2*  INR 3.65* 1.39      Ct Chest Wo Contrast  02/14/2015   CLINICAL DATA:   Abnormal chest x-ray, progressive shortness of Breath  EXAM: CT CHEST WITHOUT CONTRAST  TECHNIQUE: Multidetector CT imaging of the chest was performed following the standard protocol without IV contrast.  COMPARISON:  10/10/2010  FINDINGS: Sagittal images of the spine shows diffuse osteopenia. Degenerative changes thoracic spine. Sagittal view of the sternum is unremarkable. Again noted moderate size hiatal hernia measures 7.6 by 7.5 cm. Atherosclerotic calcification of mitral valve. Atherosclerotic calcifications of coronary arteries and aortic root.  Heart size within normal limits. Trace anterior pericardial effusion. The visualized upper abdomen shows status postcholecystectomy. Stable calcified splenic lesion measures 1.4 cm.  No destructive rib lesions are noted. Central airways are patent. No mediastinal hematoma or adenopathy. No hilar adenopathy.  There is small lower right pleural effusion with right base posterior atelectasis. No pulmonary edema. There is no focal consolidation or pulmonary nodules. No bronchiectasis or emphysema. Atherosclerotic calcifications of thoracic aorta.  IMPRESSION: 1. There is no acute infiltrate or pulmonary edema. 2. Stable moderate size hiatal hernia measures 7.5 x 7.6 cm. 3. Mitral valve calcifications are noted. Aortic calcifications are noted. Coronary arteries calcifications. 4. Small right pleural effusion with right base posterior atelectasis. 5. Status postcholecystectomy. 6. Degenerative changes thoracolumbar spine.  7. No mediastinal hematoma or adenopathy.   Electronically Signed   By: Lahoma Crocker M.D.   On: 02/14/2015 14:16    ASSESSMENT/PLAN:  1) Melena. 2) Anemia. 3) SOB. 4) Aortic stenosis.  SOB has improved. Will plan on EGD later today as pt has been NPO.Will have pt signmed release to get records from [facility] re pill cam.INR 1.39 om 8/13.    LOS: 2 days   Aamirah Salmi, Vita Barley PA-C 02/15/2015, Pager [phone]

## 2015-02-15 NOTE — Progress Notes (Signed)
[facility] PROGRESS NOTE  [name]:[id number] DOB: 22-Jan-1941 DOA: 02/12/2015 PCP: Mauricio Po, [name]  Brief narrative 74 year old female with history of diastolic CHF, chronic A. fib on Coumadin, hypertension, aortic stenosis, GERD and chronic back pain presented to the ED with progressive shortness of breath for one week. Reports fatigue and some orthopnea. Reports mild epigastric pain and melanotic for past 2 weeks. Patient found to have significant drop in her hemoglobin (from 13-17 g), with supratherapeutic INR. Patient had EGD in 2011 with some erythema in the antrum large hiatal hernia (done by Dr. Amedeo Plenty). Patient denies use of NSAIDs. Admitted to telemetry for further workup and GI consulted.  Assessment/Plan: Acute blood loss anemia Likely secondary to upper GI bleed.  -H&H improved with PRBC and INR reversed with FFP. Plan for EGD and possible colonoscopy this afternoon.  Shortness of breath Possibly related to atelectasis. Chest x-ray showing mild right-sided pleural effusion but no infiltrate. Received a few dose of Lasix. ABG, 2-D echo and CT of the chest unremarkable. He has resolved and patient maintaining O2 sat on room air.  Chronic A. fib On Coumadin with supratherapeutic INR. Coumadin has been held and reversed with FFP. Follow-up INR normal. stable on telemetry. Continue home dose metoprolol.   Essential hypertension Continue amlodipine and clonidine.  Chronic arthritis Continue tramadol  DVT prophylaxis: SCDs  diet: Heart healthy  Code Status: Full code Family Communication: none at bedside Disposition Plan: home possibly tomorrow   Consultants:  GI (Dr. Benson Norway)  Procedures:  For EGD today  2-D echo  Antibiotics:  None  HPI/Subjective: Shortness of breath has resolved. Denies melena.  Objective: Filed Vitals:   02/15/15 1211  BP: 162/73  Pulse:   Temp: 97.9 F (36.6 C)  Resp: 13    Intake/Output Summary (Last 24  hours) at 02/15/15 1236 Last data filed at 02/15/15 0803  Gross per 24 hour  Intake    240 ml  Output    700 ml  Net   -460 ml   Filed Weights   02/14/15 0454 02/15/15 0636 02/15/15 1211  Weight: 59.5 kg (131 lb 2.8 oz) 57.4 kg (126 lb 8.7 oz) 58.968 kg (130 lb)    Exam:   General:  No acute distress  HEENT: Moist mucosa  Cardiovascular: s1& S2 with irregularly irregular, no murmurs  Respiratory: Ear bilaterally  Abdomen: , Nondistended, nontender, bowel sounds present  Musculoskeletal: Warm, no edema    Data Reviewed: Basic Metabolic Panel:  Recent Labs Lab 02/12/15 2129 02/12/15 2137 02/13/15 0808  NA 140 139 140  K 4.5 4.5 3.8  CL 109 105 106  CO2 23  --  28  GLUCOSE 160* 154* 134*  BUN 13 14 12   CREATININE 0.97 1.00 0.83  CALCIUM 8.1*  --  8.2*   Liver Function Tests:  Recent Labs Lab 02/12/15 2129 02/13/15 0808  AST 22 16  ALT 13* 14  ALKPHOS 53 58  BILITOT 0.4 1.1  PROT 5.7* 5.9*  ALBUMIN 3.2* 3.2*   No results for input(s): LIPASE, AMYLASE in the last 168 hours. No results for input(s): AMMONIA in the last 168 hours. CBC:  Recent Labs Lab 02/12/15 2129 02/12/15 2137 02/13/15 1107 02/13/15 1712 02/15/15 0448  WBC 4.7  --  4.8  --  6.4  NEUTROABS 2.9  --   --   --   --   HGB 7.0* 7.1* 9.3* 9.9* 11.7*  HCT 22.4* 21.0* 28.1* 30.9* 36.9  MCV 81.8  --  82.6  --  81.6  PLT 206  --  126*  --  199   Cardiac Enzymes:  Recent Labs Lab 02/13/15 0808 02/13/15 1416  TROPONINI <0.03 0.03   BNP (last 3 results)  Recent Labs  02/12/15 2130  BNP 394.8*    ProBNP (last 3 results) No results for input(s): PROBNP in the last 8760 hours.  CBG: No results for input(s): GLUCAP in the last 168 hours.  No results found for this or any previous visit (from the past 240 hour(s)).   Studies: Ct Chest Wo Contrast  02/14/2015   CLINICAL DATA:  Abnormal chest x-ray, progressive shortness of Breath  EXAM: CT CHEST WITHOUT CONTRAST   TECHNIQUE: Multidetector CT imaging of the chest was performed following the standard protocol without IV contrast.  COMPARISON:  10/10/2010  FINDINGS: Sagittal images of the spine shows diffuse osteopenia. Degenerative changes thoracic spine. Sagittal view of the sternum is unremarkable. Again noted moderate size hiatal hernia measures 7.6 by 7.5 cm. Atherosclerotic calcification of mitral valve. Atherosclerotic calcifications of coronary arteries and aortic root.  Heart size within normal limits. Trace anterior pericardial effusion. The visualized upper abdomen shows status postcholecystectomy. Stable calcified splenic lesion measures 1.4 cm.  No destructive rib lesions are noted. Central airways are patent. No mediastinal hematoma or adenopathy. No hilar adenopathy.  There is small lower right pleural effusion with right base posterior atelectasis. No pulmonary edema. There is no focal consolidation or pulmonary nodules. No bronchiectasis or emphysema. Atherosclerotic calcifications of thoracic aorta.  IMPRESSION: 1. There is no acute infiltrate or pulmonary edema. 2. Stable moderate size hiatal hernia measures 7.5 x 7.6 cm. 3. Mitral valve calcifications are noted. Aortic calcifications are noted. Coronary arteries calcifications. 4. Small right pleural effusion with right base posterior atelectasis. 5. Status postcholecystectomy. 6. Degenerative changes thoracolumbar spine. 7. No mediastinal hematoma or adenopathy.   Electronically Signed   By: Lahoma Crocker M.D.   On: 02/14/2015 14:16    Scheduled Meds: . furosemide  40 mg Intravenous BID  . metoprolol  25 mg Oral BID  . pantoprazole (PROTONIX) IV  40 mg Intravenous Q12H  . sodium chloride  3 mL Intravenous Q12H   Continuous Infusions: . sodium chloride    . lactated ringers        Time spent: 25 minutes    Malakhi Markwood  [facility] Pager [phone]. If 7PM-7AM, please contact night-coverage at [email or web address], password  [facility] 02/15/2015, 12:36 PM  LOS: 2 days

## 2015-02-16 ENCOUNTER — Encounter (HOSPITAL_COMMUNITY)
Admission: EM | Disposition: A | Discharge status: Home Health | Payer: Self-pay | Source: Home / Self Care | Attending: Internal Medicine

## 2015-02-16 ENCOUNTER — Encounter (HOSPITAL_COMMUNITY): Payer: Self-pay | Admitting: *Deleted

## 2015-02-16 DIAGNOSIS — K222 Esophageal obstruction: Secondary | ICD-10-CM

## 2015-02-16 DIAGNOSIS — D649 Anemia, unspecified: Secondary | ICD-10-CM

## 2015-02-16 HISTORY — PX: ESOPHAGOGASTRODUODENOSCOPY: SHX5428

## 2015-02-16 SURGERY — EGD (ESOPHAGOGASTRODUODENOSCOPY)
Anesthesia: Moderate Sedation

## 2015-02-16 MED ORDER — SODIUM CHLORIDE 0.9 % IV SOLN
INTRAVENOUS | Status: DC
  Administered 2015-02-17: 500 mL via INTRAVENOUS

## 2015-02-16 MED ORDER — BUTAMBEN-TETRACAINE-BENZOCAINE 2-2-14 % EX AERO
INHALATION_SPRAY | CUTANEOUS | Status: DC | PRN
  Administered 2015-02-16: 2 via TOPICAL

## 2015-02-16 MED ORDER — PEG-KCL-NACL-NASULF-NA ASC-C 100 G PO SOLR
0.5000 | Freq: Once | ORAL | Status: DC
  Filled 2015-02-16: qty 1

## 2015-02-16 MED ORDER — PEG-KCL-NACL-NASULF-NA ASC-C 100 G PO SOLR: 1.0000 | Freq: Once | ORAL | Status: DC

## 2015-02-16 MED ORDER — PEG-KCL-NACL-NASULF-NA ASC-C 100 G PO SOLR: 0.5000 | Freq: Once | ORAL | Status: DC

## 2015-02-16 MED ORDER — SODIUM CHLORIDE 0.9 % IV SOLN
INTRAVENOUS | Status: DC
  Administered 2015-02-16: 500 mL via INTRAVENOUS

## 2015-02-16 MED ORDER — SODIUM CHLORIDE 0.9 % IV SOLN: INTRAVENOUS | Status: DC

## 2015-02-16 MED ORDER — FENTANYL CITRATE (PF) 100 MCG/2ML IJ SOLN
INTRAMUSCULAR | Status: DC | PRN
  Administered 2015-02-16: 25 ug via INTRAVENOUS
  Administered 2015-02-16: 12.5 ug via INTRAVENOUS

## 2015-02-16 MED ORDER — PEG-KCL-NACL-NASULF-NA ASC-C 100 G PO SOLR
0.5000 | Freq: Once | ORAL | Status: AC
  Administered 2015-02-17: 100 g via ORAL

## 2015-02-16 MED ORDER — MIDAZOLAM HCL 10 MG/2ML IJ SOLN
INTRAMUSCULAR | Status: DC | PRN
  Administered 2015-02-16: 2 mg via INTRAVENOUS
  Administered 2015-02-16: 1 mg via INTRAVENOUS

## 2015-02-16 MED ORDER — PEG-KCL-NACL-NASULF-NA ASC-C 100 G PO SOLR
0.5000 | Freq: Once | ORAL | Status: AC
  Administered 2015-02-16: 100 g via ORAL
  Filled 2015-02-16: qty 1

## 2015-02-16 NOTE — Progress Notes (Addendum)
[facility] PROGRESS NOTE  NAYDEEN SPEIRS NIO:[id number] DOB: [birth year] DOA: 02/12/2015 PCP: Mauricio Po, [name]  Brief narrative 74 year old female with history of diastolic CHF, chronic A. fib on Coumadin, hypertension, aortic stenosis, GERD and chronic back pain presented to the ED with progressive shortness of breath for one week. Reports fatigue and some orthopnea. Reports mild epigastric pain and melanotic for past 2 weeks. Patient found to have significant drop in her hemoglobin (from 13-17 g), with supratherapeutic INR. Patient had EGD in 2011 with some erythema in the antrum large hiatal hernia (done by Dr. Amedeo Plenty). Patient denies use of NSAIDs. Admitted to telemetry for further workup and GI consulted.  Assessment/Plan: Acute blood loss anemia Likely secondary to upper GI bleed.  -H&H improved with PRBC and INR reversed with FFP. EGD and possible colonoscopy this afternoon.  Shortness of breath Possibly related to atelectasis. Chest x-ray showing mild right-sided pleural effusion but no infiltrate.  ABG, 2-D echo and CT of the chest unremarkable. now resolved.   Chronic A. fib On Coumadin with supratherapeutic INR. Coumadin has been held and reversed with FFP. Follow-up INR normal. stable on telemetry. Continue home dose metoprolol. Need to decide on resuming coumadin based on EGD results.   Essential hypertension Continue amlodipine and clonidine.  Chronic arthritis Continue tramadol  DVT prophylaxis: SCDs  diet: Heart healthy  Code Status: Full code Family Communication: none at bedside Disposition Plan: home possibly on 8/17   Consultants:  GI (Dr. Benson Norway)  Procedures:  For EGD today  2-D echo  Antibiotics:  None  HPI/Subjective: No overnight issues. No melena   Objective: Filed Vitals:   02/16/15 1013  BP: 134/38  Pulse: 65  Temp:   Resp:     Intake/Output Summary (Last 24 hours) at 02/16/15 1301 Last data filed at 02/16/15 0900  Gross per 24 hour  Intake    480 ml  Output   1000 ml  Net   -520 ml   Filed Weights   02/15/15 0636 02/15/15 1211 02/16/15 0547  Weight: 57.4 kg (126 lb 8.7 oz) 58.968 kg (130 lb) 57.8 kg (127 lb 6.8 oz)    Exam:   General:  No acute distress  HEENT: Moist mucosa  Cardiovascular: s1& S2 with irregularly irregular, no murmurs  Respiratory: Ear bilaterally  Abdomen: , Nondistended, nontender, bowel sounds present  Musculoskeletal: Warm, no edema    Data Reviewed: Basic Metabolic Panel:  Recent Labs Lab 02/12/15 2129 02/12/15 2137 02/13/15 0808  NA 140 139 140  K 4.5 4.5 3.8  CL 109 105 106  CO2 23  --  28  GLUCOSE 160* 154* 134*  BUN 13 14 12   CREATININE 0.97 1.00 0.83  CALCIUM 8.1*  --  8.2*   Liver Function Tests:  Recent Labs Lab 02/12/15 2129 02/13/15 0808  AST 22 16  ALT 13* 14  ALKPHOS 53 58  BILITOT 0.4 1.1  PROT 5.7* 5.9*  ALBUMIN 3.2* 3.2*   No results for input(s): LIPASE, AMYLASE in the last 168 hours. No results for input(s): AMMONIA in the last 168 hours. CBC:  Recent Labs Lab 02/12/15 2129 02/12/15 2137 02/13/15 1107 02/13/15 1712 02/15/15 0448  WBC 4.7  --  4.8  --  6.4  NEUTROABS 2.9  --   --   --   --   HGB 7.0* 7.1* 9.3* 9.9* 11.7*  HCT 22.4* 21.0* 28.1* 30.9* 36.9  MCV 81.8  --  82.6  --  81.6  PLT 206  --  126*  --  199   Cardiac Enzymes:  Recent Labs Lab 02/13/15 0808 02/13/15 1416  TROPONINI <0.03 0.03   BNP (last 3 results)  Recent Labs  02/12/15 2130  BNP 394.8*    ProBNP (last 3 results) No results for input(s): PROBNP in the last 8760 hours.  CBG: No results for input(s): GLUCAP in the last 168 hours.  No results found for this or any previous visit (from the past 240 hour(s)).   Studies: No results found.  Scheduled Meds: . metoprolol  25 mg Oral BID  . pantoprazole  40 mg Oral BID WC  . sodium chloride  3 mL Intravenous Q12H   Continuous Infusions: . sodium chloride    . lactated  ringers 1,000 mL (02/15/15 1236)      Time spent: 15 minutes    Nyna Chilton, [place]  [facility] Pager [phone]. If 7PM-7AM, please contact night-coverage at [email or web address], password [facility] 02/16/2015, 1:01 PM  LOS: 3 days

## 2015-02-16 NOTE — H&P (View-Only) (Signed)
[facility] Progress Note  Subjective:   Less dyspneic. Says she is feeling much better.  Reports she has had EGD abd pill cam between [place] , [place] and [place] over the past few years for continued GIB, and no source has been identified. Hgb 11.7 this morning.   Objective:  Vital signs in last 24 hours: Temp:  [98.2 F (36.8 C)] 98.2 F (36.8 C) (08/15 0636) Pulse Rate:  [68-90] 84 (08/15 0636) Resp:  [20] 20 (08/15 0636) BP: (110-144)/(54-62) 110/54 mmHg (08/15 0636) SpO2:  [95 %-98 %] 96 % (08/15 0636) Weight:  [126 lb 8.7 oz (57.4 kg)] 126 lb 8.7 oz (57.4 kg) (08/15 0636) Last BM Date: 02/12/15 General:   Alert,  Well-developed,    in NAD Heart:  Regular rate and rhythm; no murmurs Pulm;lungs clear Abdomen:  Soft, nontender and nondistended. Normal bowel sounds, without guarding, and without rebound.   Extremities:  Without edema. Neurologic: Alert and  oriented x4;  grossly normal neurologically. Psych: Alert and cooperative. Normal mood and affect.  Intake/Output from previous day: 08/14 0701 - 08/15 0700 In: 480 [P.O.:480] Out: 1000 [Urine:1000] Intake/Output this shift: Total I/O In: -  Out: 100 [Urine:100]  Lab Results:  Recent Labs  02/12/15 2129  02/13/15 1107 02/13/15 1712 02/15/15 0448  WBC 4.7  --  4.8  --  6.4  HGB 7.0*  < > 9.3* 9.9* 11.7*  HCT 22.4*  < > 28.1* 30.9* 36.9  PLT 206  --  126*  --  199  < > = values in this interval not displayed. BMET  Recent Labs  02/12/15 2129 02/12/15 2137 02/13/15 0808  NA 140 139 140  K 4.5 4.5 3.8  CL 109 105 106  CO2 23  --  28  GLUCOSE 160* 154* 134*  BUN 13 14 12   CREATININE 0.97 1.00 0.83  CALCIUM 8.1*  --  8.2*   LFT  Recent Labs  02/13/15 0808  PROT 5.9*  ALBUMIN 3.2*  AST 16  ALT 14  ALKPHOS 58  BILITOT 1.1   PT/INR  Recent Labs  02/12/15 2129 02/13/15 1107  LABPROT 35.4* 17.2*  INR 3.65* 1.39      Ct Chest Wo Contrast  02/14/2015   CLINICAL DATA:   Abnormal chest x-ray, progressive shortness of Breath  EXAM: CT CHEST WITHOUT CONTRAST  TECHNIQUE: Multidetector CT imaging of the chest was performed following the standard protocol without IV contrast.  COMPARISON:  10/10/2010  FINDINGS: Sagittal images of the spine shows diffuse osteopenia. Degenerative changes thoracic spine. Sagittal view of the sternum is unremarkable. Again noted moderate size hiatal hernia measures 7.6 by 7.5 cm. Atherosclerotic calcification of mitral valve. Atherosclerotic calcifications of coronary arteries and aortic root.  Heart size within normal limits. Trace anterior pericardial effusion. The visualized upper abdomen shows status postcholecystectomy. Stable calcified splenic lesion measures 1.4 cm.  No destructive rib lesions are noted. Central airways are patent. No mediastinal hematoma or adenopathy. No hilar adenopathy.  There is small lower right pleural effusion with right base posterior atelectasis. No pulmonary edema. There is no focal consolidation or pulmonary nodules. No bronchiectasis or emphysema. Atherosclerotic calcifications of thoracic aorta.  IMPRESSION: 1. There is no acute infiltrate or pulmonary edema. 2. Stable moderate size hiatal hernia measures 7.5 x 7.6 cm. 3. Mitral valve calcifications are noted. Aortic calcifications are noted. Coronary arteries calcifications. 4. Small right pleural effusion with right base posterior atelectasis. 5. Status postcholecystectomy. 6. Degenerative changes thoracolumbar spine.  7. No mediastinal hematoma or adenopathy.   Electronically Signed   By: Lahoma Crocker M.D.   On: 02/14/2015 14:16    ASSESSMENT/PLAN:  1) Melena. 2) Anemia. 3) SOB. 4) Aortic stenosis.  SOB has improved. Will plan on EGD later today as pt has been NPO.Will have pt signmed release to get records from [facility] re pill cam.INR 1.39 om 8/13.    LOS: 2 days   Kostantinos Tallman, Vita Barley PA-C 02/15/2015, Pager [phone]

## 2015-02-16 NOTE — Care Management Important Message (Signed)
Important Message  Patient Details  Name: [name] MRN: [id number] Date of Birth: Mar 24, 1941   Medicare Important Message Given:  [facility] notification given    Camillo Flaming 02/16/2015, 12:29 [place] Message  Patient Details  Name: [name] MRN: [id number] Date of Birth: May 15, 1941   Medicare Important Message Given:  Yes-second notification given    Camillo Flaming 02/16/2015, 12:29 PM

## 2015-02-16 NOTE — Interval H&P Note (Signed)
History and Physical Interval Note:  02/16/2015 2:05 PM  [name]  has presented today for surgery, with the diagnosis of melena, anemia  The various methods of treatment have been discussed with the patient and family. After consideration of risks, benefits and other options for treatment, the patient has consented to  Procedure(s): ESOPHAGOGASTRODUODENOSCOPY (EGD) (N/A) as a surgical intervention .  The patient's history has been reviewed, patient examined, no change in status, stable for surgery.  I have reviewed the patient's chart and labs.  Questions were answered to the patient's satisfaction.    The recent H&P (dated **02/16/15*) was reviewed, the patient was examined and there is no change in the patients condition since that H&P was completed.   Erskine Emery  02/16/2015, 2:05 PM    Erskine Emery

## 2015-02-16 NOTE — Op Note (Signed)
[facility] [place], [id number]   ENDOSCOPY PROCEDURE REPORT  PATIENT: [name], [name]  MR#: [id number] BIRTHDATE: 03-09-1941 , 74  yrs. old GENDER: female ENDOSCOPIST: [name], MD REFERRED BY: PROCEDURE DATE:  02/16/2015 PROCEDURE:  EGD, diagnostic ASA CLASS:     Class III INDICATIONS:  acute post hemorrhagic anemia. MEDICATIONS: Fentanyl 37.5 mg IV and Versed 3 mg IV TOPICAL ANESTHETIC: Cetacaine Spray  DESCRIPTION OF PROCEDURE: After the risks benefits and alternatives of the procedure were thoroughly explained, informed consent was obtained.  The Pentax Gastroscope [id number] endoscope was introduced through the mouth and advanced to the second portion of the duodenum , Without limitations.  The instrument was slowly withdrawn as the mucosa was fully examined.    ESOPHAGUS: There was a peptic stricture at the gastroesophageal junction.  The stricture was traversable.   Except for the findings listed, the EGD was otherwise normal.   STOMACH: A 10 cm hiatal hernia was noted.    Retroflexed views revealed no abnormalities. The scope was then withdrawn from the patient and the procedure completed.  COMPLICATIONS: There were no immediate complications.  ENDOSCOPIC IMPRESSION: 1.  esophageal stricture 2.  Large hiatal hernia  no source for GI bleeding identified  RECOMMENDATIONS: colonoscopy  REPEAT EXAM:  eSigned:  [name], MD 02/16/2015 2:56 PM    CC:

## 2015-02-16 NOTE — Progress Notes (Signed)
No source for GI bleeding identified by upper endoscopy.  Recommendations #1 colonoscopy-schedule for a.m.

## 2015-02-17 ENCOUNTER — Encounter (HOSPITAL_COMMUNITY)
Admission: EM | Disposition: A | Discharge status: Home Health | Payer: Self-pay | Source: Home / Self Care | Attending: Internal Medicine

## 2015-02-17 ENCOUNTER — Encounter (HOSPITAL_COMMUNITY): Payer: Self-pay | Admitting: Gastroenterology

## 2015-02-17 DIAGNOSIS — K922 Gastrointestinal hemorrhage, unspecified: Secondary | ICD-10-CM

## 2015-02-17 DIAGNOSIS — D124 Benign neoplasm of descending colon: Secondary | ICD-10-CM

## 2015-02-17 DIAGNOSIS — K5730 Diverticulosis of large intestine without perforation or abscess without bleeding: Secondary | ICD-10-CM

## 2015-02-17 DIAGNOSIS — D123 Benign neoplasm of transverse colon: Secondary | ICD-10-CM

## 2015-02-17 HISTORY — PX: COLONOSCOPY: SHX5424

## 2015-02-17 LAB — CBC
HEMATOCRIT: 34.9 % — AB (ref 36.0–46.0)
HEMOGLOBIN: 11.1 g/dL — AB (ref 12.0–15.0)
MCH: 26 pg (ref 26.0–34.0)
MCHC: 31.8 g/dL (ref 30.0–36.0)
MCV: 81.7 fL (ref 78.0–100.0)
Platelets: 167 10*3/uL (ref 150–400)
RBC: 4.27 MIL/uL (ref 3.87–5.11)
RDW: 14.5 % (ref 11.5–15.5)
WBC: 4.9 10*3/uL (ref 4.0–10.5)

## 2015-02-17 SURGERY — COLONOSCOPY
Anesthesia: Moderate Sedation

## 2015-02-17 MED ORDER — PANTOPRAZOLE SODIUM 40 MG PO TBEC: 40.0000 mg | DELAYED_RELEASE_TABLET | Freq: Every day | ORAL | Status: DC

## 2015-02-17 MED ORDER — FENTANYL CITRATE (PF) 100 MCG/2ML IJ SOLN
INTRAMUSCULAR | Status: AC
  Filled 2015-02-17: qty 2

## 2015-02-17 MED ORDER — WARFARIN SODIUM 5 MG PO TABS: ORAL_TABLET | ORAL | Status: DC

## 2015-02-17 MED ORDER — FENTANYL CITRATE (PF) 100 MCG/2ML IJ SOLN
INTRAMUSCULAR | Status: DC | PRN
  Administered 2015-02-17 (×3): 25 ug via INTRAVENOUS

## 2015-02-17 MED ORDER — DIPHENHYDRAMINE HCL 50 MG/ML IJ SOLN
INTRAMUSCULAR | Status: AC
  Filled 2015-02-17: qty 1

## 2015-02-17 MED ORDER — MIDAZOLAM HCL 5 MG/ML IJ SOLN
INTRAMUSCULAR | Status: AC
  Filled 2015-02-17: qty 2

## 2015-02-17 MED ORDER — MIDAZOLAM HCL 5 MG/5ML IJ SOLN
INTRAMUSCULAR | Status: DC | PRN
  Administered 2015-02-17 (×3): 2 mg via INTRAVENOUS

## 2015-02-17 NOTE — H&P (View-Only) (Signed)
No source for GI bleeding identified by upper endoscopy.  Recommendations #1 colonoscopy-schedule for a.m.

## 2015-02-17 NOTE — Care Management Note (Signed)
Case Management Note  Patient Details  Name: [name] MRN: [id number] Date of Birth: 10/05/1940  Subjective/Objective:                   diagnosis of melena, anemia Action/Plan: Discharge planning  Expected Discharge Date:  02/17/15                Expected Discharge Plan:  Home w Home Health Services  In-House Referral:     Discharge planning Services  CM Consult  Post Acute Care Choice:  Home Health Choice offered to:  Patient  DME Arranged:    DME Agency:     HH Arranged:  Patient Refused HH Agency:     Status of Service:  Completed, signed off  Medicare Important Message Given:  Yes-second notification given Date Medicare IM Given:    Medicare IM give by:    Date Additional Medicare IM Given:    Additional Medicare Important Message give by:     If discussed at Long Length of Stay Meetings, dates discussed:    Additional Comments: CM met with pt to offer choice of home health agency.  Pt politely refuses an HH services.  Pt states she is well aware of the signs and symptoms of further bleeding and will seek medical attention if necessary.  No other CM needs were communicated. ,  Christine, RN 02/17/2015, 3:27 PM  

## 2015-02-17 NOTE — Discharge Summary (Addendum)
Physician Discharge Summary  [name] MRN: [id number] DOB/AGE: 1941/02/18 74 y.o.  PCP: Mauricio Po, [name]   Admit date: 02/12/2015 Discharge date: 02/17/2015  Discharge Diagnoses:     Principal Problem:   UGI bleed Active Problems:   PAF (paroxysmal atrial fibrillation)   CHF (congestive heart failure)   HTN (hypertension)   Aortic stenosis   Sleep disturbance   Supratherapeutic INR   Esophageal stricture   Benign neoplasm of descending colon   Diverticulosis of large intestine without diverticulitis    Follow-up recommendations Follow-up with PCP in 3-5 days , including all  additional recommended appointments as below INR 8/22 Hemoccults in 7-10 days; if positive proceed with capsule endoscopy CBC, CMP in 7-10 days Follow-up with gastroenterology [name], MD in 1-2 weeks if FOBT is still positive and the patient is anemic, patient can cancel this appointment if everything looks stable     Medication List    TAKE these medications        amLODipine 10 MG tablet  Commonly known as:  NORVASC  Take 1 tablet (10 mg total) by mouth daily.     butalbital-acetaminophen-caffeine 50-325-40 MG per tablet  Commonly known as:  FIORICET, ESGIC  Take 1 tablet by mouth every 4 (four) hours as needed for headache.     cloNIDine 0.1 MG tablet  Commonly known as:  CATAPRES  Take 1 tablet (0.1 mg total) by mouth daily.     furosemide 40 MG tablet  Commonly known as:  LASIX  Take 1-2 tablets (40-80 mg total) by mouth daily.     gabapentin 100 MG capsule  Commonly known as:  NEURONTIN  Take 1 capsule (100 mg total) by mouth 3 (three) times daily.     meclizine 25 MG tablet  Commonly known as:  ANTIVERT  Take 1 tablet (25 mg total) by mouth 3 (three) times daily as needed for dizziness.     metoCLOPramide 10 MG tablet  Commonly known as:  REGLAN  Take 10 mg by mouth every 6 (six) hours as needed for nausea.     metoprolol tartrate 25 MG tablet  Commonly  known as:  LOPRESSOR  Take 1 tablet (25 mg total) by mouth 2 (two) times daily.     metroNIDAZOLE 500 MG tablet  Commonly known as:  FLAGYL  Take 1 tablet (500 mg total) by mouth 3 (three) times daily.     ondansetron 4 MG tablet  Commonly known as:  ZOFRAN  Take 1 tablet (4 mg total) by mouth every 8 (eight) hours as needed for nausea or vomiting.     oxycodone 5 MG capsule  Commonly known as:  OXY-IR  Take 1 capsule (5 mg total) by mouth 2 (two) times daily as needed for pain.     pantoprazole 40 MG tablet  Commonly known as:  PROTONIX  Take 1 tablet (40 mg total) by mouth daily.     pantoprazole 40 MG tablet  Commonly known as:  PROTONIX  Take 1 tablet (40 mg total) by mouth daily.     polyethylene glycol packet  Commonly known as:  MIRALAX / GLYCOLAX  Take 17 g by mouth daily as needed for moderate constipation.     simvastatin 20 MG tablet  Commonly known as:  ZOCOR  Take 1 tablet (20 mg total) by mouth daily.     temazepam 15 MG capsule  Commonly known as:  RESTORIL  Take 1 capsule (15 mg total) by mouth at  bedtime.     tizanidine 2 MG capsule  Commonly known as:  ZANAFLEX  Take 2 mg by mouth every 6 (six) hours as needed for muscle spasms.     traMADol 50 MG tablet  Commonly known as:  ULTRAM  Take 1 tablet (50 mg total) by mouth every 6 (six) hours as needed.     valsartan-hydrochlorothiazide 320-25 MG per tablet  Commonly known as:  DIOVAN-HCT  Take 1 tablet by mouth daily.     vitamin B-12 1000 MCG tablet  Commonly known as:  CYANOCOBALAMIN  Take 1,000 mcg by mouth daily.     warfarin 5 MG tablet  Commonly known as:  COUMADIN  5 mg on Monday Wednesday Friday and Sunday, 2.5 mg Tuesday Thursday Saturday  INR check 8/22         Discharge Condition:     Disposition: 06-Home-Health Care Svc   Consults:  Gastroenterology    Significant Diagnostic Studies:  Dg Chest 2 View  02/12/2015   CLINICAL DATA:  Acute onset of shortness of breath  and left shoulder pain. Initial encounter.  EXAM: CHEST  2 VIEW  COMPARISON:  Chest radiograph performed 08/26/2014, and CT of the chest performed 10/20/2010  FINDINGS: The lungs are well-aerated. Minimal bibasilar atelectasis is noted. Mild peribronchial thickening is seen. Small bilateral pleural effusions are suggested. There is no evidence of pneumothorax.  The heart is normal in size; the mediastinal contour is within normal limits. A moderate hiatal hernia is again noted. No acute osseous abnormalities are seen. Cervical spinal fusion hardware is noted. Clips are noted within the right upper quadrant, reflecting prior cholecystectomy.  IMPRESSION: 1. Minimal bibasilar atelectasis and small bilateral pleural effusions. Mild peribronchial thickening noted. 2. Moderate hiatal hernia again seen.   Electronically Signed   By: Garald Balding M.D.   On: 02/12/2015 21:54   Ct Chest Wo Contrast  02/14/2015   CLINICAL DATA:  Abnormal chest x-ray, progressive shortness of Breath  EXAM: CT CHEST WITHOUT CONTRAST  TECHNIQUE: Multidetector CT imaging of the chest was performed following the standard protocol without IV contrast.  COMPARISON:  10/10/2010  FINDINGS: Sagittal images of the spine shows diffuse osteopenia. Degenerative changes thoracic spine. Sagittal view of the sternum is unremarkable. Again noted moderate size hiatal hernia measures 7.6 by 7.5 cm. Atherosclerotic calcification of mitral valve. Atherosclerotic calcifications of coronary arteries and aortic root.  Heart size within normal limits. Trace anterior pericardial effusion. The visualized upper abdomen shows status postcholecystectomy. Stable calcified splenic lesion measures 1.4 cm.  No destructive rib lesions are noted. Central airways are patent. No mediastinal hematoma or adenopathy. No hilar adenopathy.  There is small lower right pleural effusion with right base posterior atelectasis. No pulmonary edema. There is no focal consolidation or  pulmonary nodules. No bronchiectasis or emphysema. Atherosclerotic calcifications of thoracic aorta.  IMPRESSION: 1. There is no acute infiltrate or pulmonary edema. 2. Stable moderate size hiatal hernia measures 7.5 x 7.6 cm. 3. Mitral valve calcifications are noted. Aortic calcifications are noted. Coronary arteries calcifications. 4. Small right pleural effusion with right base posterior atelectasis. 5. Status postcholecystectomy. 6. Degenerative changes thoracolumbar spine. 7. No mediastinal hematoma or adenopathy.   Electronically Signed   By: Lahoma Crocker M.D.   On: 02/14/2015 14:16        Filed Weights   02/15/15 1211 02/16/15 0547 02/17/15 0503  Weight: 58.968 kg (130 lb) 57.8 kg (127 lb 6.8 oz) 57.8 kg (127 lb 6.8 oz)  Microbiology: No results found for this or any previous visit (from the past 240 hour(s)).     Blood Culture    Component Value Date/Time   SDES BLOOD LEFT HAND 08/26/2014 1650   SPECREQUEST BOTTLES DRAWN AEROBIC AND ANAEROBIC 10CC EACH 08/26/2014 1650   CULT  08/26/2014 1650    NO GROWTH 5 DAYS Performed at [facility] 09/01/2014 FINAL 08/26/2014 1650      Labs: Results for orders placed or performed during the hospital encounter of 02/12/15 (from the past 48 hour(s))  CBC     Status: Abnormal   Collection Time: 02/17/15  5:00 AM  Result Value Ref Range   WBC 4.9 4.0 - 10.5 K/uL   RBC 4.27 3.87 - 5.11 MIL/uL   Hemoglobin 11.1 (L) 12.0 - 15.0 g/dL   HCT 34.9 (L) 36.0 - 46.0 %   MCV 81.7 78.0 - 100.0 fL   MCH 26.0 26.0 - 34.0 pg   MCHC 31.8 30.0 - 36.0 g/dL   RDW 14.5 11.5 - 15.5 %   Platelets 167 150 - 400 K/uL     Lipid Panel     Component Value Date/Time   CHOL  05/14/2010 0254    145        ATP III CLASSIFICATION:  <200     mg/dL   Desirable  200-239  mg/dL   Borderline High  >=240    mg/dL   High          TRIG 75 05/14/2010 0254   HDL 45 05/14/2010 0254   CHOLHDL 3.2 05/14/2010 0254   VLDL 15 05/14/2010  0254   LDLCALC  05/14/2010 0254    85        Total Cholesterol/HDL:CHD Risk Coronary Heart Disease Risk Table                     Men   Women  1/2 Average Risk   3.4   3.3  Average Risk       5.0   4.4  2 X Average Risk   9.6   7.1  3 X Average Risk  23.4   11.0        Use the calculated Patient Ratio above and the CHD Risk Table to determine the patient's CHD Risk.        ATP III CLASSIFICATION (LDL):  <100     mg/dL   Optimal  100-129  mg/dL   Near or Above                    Optimal  130-159  mg/dL   Borderline  160-189  mg/dL   High  >190     mg/dL   Very High     No results found for: HGBA1C   Lab Results  Component Value Date   [facility]  05/14/2010    85        Total Cholesterol/HDL:CHD Risk Coronary Heart Disease Risk Table                     Men   Women  1/2 Average Risk   3.4   3.3  Average Risk       5.0   4.4  2 X Average Risk   9.6   7.1  3 X Average Risk  23.4   11.0        Use the calculated Patient Ratio above and the CHD  Risk Table to determine the patient's CHD Risk.        ATP III CLASSIFICATION (LDL):  <100     mg/dL   Optimal  100-129  mg/dL   Near or Above                    Optimal  130-159  mg/dL   Borderline  160-189  mg/dL   High  >190     mg/dL   Very High   CREATININE 0.83 02/13/2015     HPI :74 year old female with a PMH of CHF, chronic afib, GERD, HTN, aortic stenosis, and chronic back pain admitted for SOB. Further evaluation in the ER reveals that her HGB dropped down from 13 to the 7 range. In 2011 she underwent an EGD with Dr. Amedeo Plenty with findings of some erythema in the antrum and a large hiatal hernia. No overt bleeding site identified. A CT scan of the ABM in the recent past remarked a few samll scattered sigmoid diverticula. She is on chronic anticoagulation and her INR is at 3.65. Last week she had some issues with diarrhea and then she had multiple bouts of melena. She denies taking any Peptobismol. Since her  admission, her SOB has improved, and she feels better. However, she still has issues with SOB. Her Echo last year revealed an EF of 65-70% and a mild aortic stenosis, despite the heavily calcified leaflets.   HOSPITAL COURSE:  Acute blood loss anemia -H&H improved with PRBC and INR reversed with FFP. EGD  /colonoscopy shows esophageal stricture, large hiatal hernia, no source of GI bleeding, mild diverticulosis, Pedunculated polyp  which was resected and cauterized, no fresh blood Recommendations #1 restart Coumadin #2 repeat Hemoccults in 7-10 days; if positive proceed with capsule endoscopy    Shortness of breath Possibly related to atelectasis. Chest x-ray showing mild right-sided pleural effusion but no infiltrate. ABG, 2-D echo and CT of the chest unremarkable. now resolved.   Chronic A. fib On Coumadin with supratherapeutic INR. Coumadin has been held and reversed with FFP. Follow-up INR normal. stable on telemetry. Continue home dose metoprolol. Coumadin can be resumed, dose adjusted to 5 mg alternating with 2.5 mg as the patient presented with supratherapeutic INR Recheck INR 8/22  Essential hypertension Continue amlodipine and clonidine.  Chronic arthritis Continue tramadol    Discharge Exam:    Blood pressure 132/30, pulse 64, temperature 97.9 F (36.6 C), temperature source Oral, resp. rate 19, height 5\' 2"  (1.575 m), weight 57.8 kg (127 lb 6.8 oz), SpO2 96 %. Gen: NAD, Alert and Oriented HEENT: [place]/AT, EOMI Neck: Supple, no LAD Lungs: CTA Bilaterally CV: RRR without M/G/R ABM: Soft, NTND, +BS Ext: No C/C/E        Discharge Instructions    Diet - low sodium heart healthy    Complete by:  As directed      Diet - low sodium heart healthy    Complete by:  As directed      Increase activity slowly    Complete by:  As directed      Increase activity slowly    Complete by:  As directed            Follow-up Information    Follow up with Mauricio Po,  [name]. Schedule an appointment as soon as possible for a visit in 1 week.   Specialty:  Family Medicine   Why:  Check FOB T in 7-10 days   Contact information:  [address] [place] [address] [phone]       Follow up with Erskine Emery, MD. Schedule an appointment as soon as possible for a visit in 2 weeks.   Specialty:  Gastroenterology   Contact information:   [address] [place] [address] [phone]       Signed: Reyne Dumas 02/17/2015, 1:06 PM        Time spent >45 mins

## 2015-02-17 NOTE — Interval H&P Note (Signed)
History and Physical Interval Note:  02/17/2015 9:55 AM  [name]  has presented today for surgery, with the diagnosis of anemia  The various methods of treatment have been discussed with the patient and family. After consideration of risks, benefits and other options for treatment, the patient has consented to  Procedure(s): COLONOSCOPY (N/A) as a surgical intervention .  The patient's history has been reviewed, patient examined, no change in status, stable for surgery.  I have reviewed the patient's chart and labs.  Questions were answered to the patient's satisfaction.    The recent H&P (dated **02/16/15*) was reviewed, the patient was examined and there is no change in the patients condition since that H&P was completed.   Erskine Emery  02/17/2015, 9:55 AM    Erskine Emery

## 2015-02-17 NOTE — Op Note (Signed)
[facility] [place], [address]   COLONOSCOPY PROCEDURE REPORT     EXAM DATE: 02-27-2015  PATIENT NAME:      [name], [name]           MR #:      [id number]  BIRTHDATE:       1941-05-25      VISIT #:     [phone]  ATTENDING:     [name], MD     STATUS:     outpatient ASSISTANT:      William Dalton and Jeb Levering   INDICATIONS:  The patient is a 74 yr old female here for a colonoscopy due to PROCEDURE PERFORMED:     Colonoscopy with snare polypectomy MEDICATIONS:     Fentanyl 75 mcg IV and Versed 6 mg IV ESTIMATED BLOOD LOSS:     None  CONSENT: The patient understands the risks and benefits of the procedure and understands that these risks include, but are not limited to: sedation, allergic reaction, infection, perforation and/or bleeding. Alternative means of evaluation and treatment include, among others: physical exam, x-rays, and/or surgical intervention. The patient elects to proceed with this endoscopic procedure.  DESCRIPTION OF PROCEDURE: During intra-op preparation period all mechanical & medical equipment was checked for proper function. Hand hygiene and appropriate measures for infection prevention was taken. After the risks, benefits and alternatives of the procedure were thoroughly explained, Informed consent was verified, confirmed and timeout was successfully executed by the treatment team. A digital exam revealed no abnormalities of the rectum. The Pentax Ped Colon [id number] endoscope was introduced through the anus and advanced to the terminal ileum which was intubated for a short distance. (MiraLax was used) excellent. The instrument was then slowly withdrawn as the colon was fully examined.Estimated blood loss is zero unless otherwise noted in this procedure report.   COLON FINDINGS: There was mild diverticulosis noted in the proximal transverse colon.   A pedunculated polyp measuring 10 mm in  size was found in the descending colon.  A polypectomy was performed using snare cautery.  The resection was complete, the polyp tissue was completely retrieved and sent to histology.   No fresh or old blood. Retroflexed views revealed no abnormalities. The scope was then completely withdrawn from the patient and the procedure terminated. SCOPE WITHDRAWAL TIME:    ADVERSE EVENTS:      There were no immediate complications.  IMPRESSIONS:     1.  Mild diverticulosis was noted in the proximal transverse colon 2.  Pedunculated polyp was found in the descending colon; polypectomy was performed using snare cautery 3.  No fresh or old blood  polyp may have been bleeding source, especially in the face of anticoagulation therapy  RECOMMENDATIONS:     If the polyp(s) removed today are proven to be adenomatous (pre-cancerous) polyps, you will need a repeat colonoscopy in 5 years.  Otherwise you should continue to follow colorectal cancer screening guidelines for "routine risk" patients with colonoscopy in 10 years.  You will receive a letter within 1-2 weeks with the results of your biopsy as well as final recommendations.  Please call my office if you have not received a letter after 3 weeks. resume Coumadin today Follow-up Hemoccults in 7-10 days; if positive proceed with capsule endoscopy RECALL:  _____________________________ [name], MD eSigned:  [name], MD 02-27-2015 10:39 AM   cc:   CPT CODES: ICD CODES:  The ICD and CPT codes  recommended by this software are interpretations from the data that the clinical staff has captured with the software.  The verification of the translation of this report to the ICD and CPT codes and modifiers is the sole responsibility of the health care institution and practicing physician where this report was generated.  [employer] will not be held responsible for the validity of the ICD and CPT codes  included on this report.  AMA assumes no liability for data contained or not contained herein. CPT is a Designer, television/film set of the [employer].   PATIENT NAME:  [name], [name] MR#: [id number]

## 2015-02-17 NOTE — Progress Notes (Signed)
Colonoscopy demonstrated a peduncular polyp.  This could be the bleeding source, especially in the face of anticoagulation therapy.  Few diverticula were seen in the proximal transverse colon.  Recommendations #1 restart Coumadin #2 repeat Hemoccults in 7-10 days; if positive proceed with capsule endoscopy

## 2015-02-18 ENCOUNTER — Telehealth: Payer: Self-pay | Admitting: *Deleted

## 2015-02-18 ENCOUNTER — Encounter (HOSPITAL_COMMUNITY): Payer: Self-pay | Admitting: Gastroenterology

## 2015-02-18 MED ORDER — TEMAZEPAM 15 MG PO CAPS: 15.0000 mg | ORAL_CAPSULE | Freq: Every day | ORAL | Status: DC

## 2015-02-18 NOTE — Telephone Encounter (Signed)
Transition Care Management Follow-up Telephone Call   Date discharged? 02/17/15   How have you been since you were released from the hospital? Pt states she is alright   Do you understand why you were in the hospital? YES   Do you understand the discharge instructions? YES   Where were you discharged to? Home   Items Reviewed:  Medications reviewed: YES, but pt states she is needing her temazepam refill inform pt a msg has been sent to [place] reviewed: YES  Dietary changes reviewed: YES  Referrals reviewed: No referral needed   Functional Questionnaire:   Activities of Daily Living (ADLs):   She states she are independent in the following: ambulation, bathing and hygiene, feeding, continence, grooming, toileting and dressing States she doesn't require assistance    Any transportation issues/concerns?: NO   Any patient concerns? NO   Confirmed importance and date/time of follow-up visits scheduled YES, appt made 02/22/15  Provider Appointment booked with Terri Piedra  Confirmed with patient if condition begins to worsen call PCP or go to the ER.  Patient was given the office number and encouraged to call back with question or concerns.  : YES

## 2015-02-18 NOTE — Telephone Encounter (Signed)
Called pt no answer LMOM rx fax to pharmacy...[name]

## 2015-02-18 NOTE — Telephone Encounter (Signed)
Medication refilled

## 2015-02-18 NOTE — Telephone Encounter (Signed)
Pt left msg on triage yesterday afternoon @ 5:40 pm stating her pharmacy stated they have sent a refill request on her temazepam last week, and they haven't receive response. Pt is wanting temazepam fill today.Pls advise...[name]

## 2015-02-22 ENCOUNTER — Ambulatory Visit (INDEPENDENT_AMBULATORY_CARE_PROVIDER_SITE_OTHER): Payer: 59 | Admitting: Family

## 2015-02-22 ENCOUNTER — Other Ambulatory Visit (INDEPENDENT_AMBULATORY_CARE_PROVIDER_SITE_OTHER): Payer: 59

## 2015-02-22 ENCOUNTER — Encounter: Payer: Self-pay | Admitting: Family

## 2015-02-22 ENCOUNTER — Encounter: Payer: Self-pay | Admitting: Gastroenterology

## 2015-02-22 VITALS — BP 138/68 | HR 67 | Temp 98.1°F | Resp 18 | Ht 62.0 in | Wt 134.0 lb

## 2015-02-22 DIAGNOSIS — R0602 Shortness of breath: Secondary | ICD-10-CM

## 2015-02-22 DIAGNOSIS — I480 Paroxysmal atrial fibrillation: Secondary | ICD-10-CM

## 2015-02-22 DIAGNOSIS — K922 Gastrointestinal hemorrhage, unspecified: Secondary | ICD-10-CM

## 2015-02-22 LAB — PROTIME-INR
INR: 1.2 ratio — ABNORMAL HIGH (ref 0.8–1.0)
PROTHROMBIN TIME: 13.6 s — AB (ref 9.6–13.1)

## 2015-02-22 LAB — CBC
HEMATOCRIT: 35.2 % — AB (ref 36.0–46.0)
HEMOGLOBIN: 11.4 g/dL — AB (ref 12.0–15.0)
MCHC: 32.4 g/dL (ref 30.0–36.0)
MCV: 81.6 fl (ref 78.0–100.0)
Platelets: 169 10*3/uL (ref 150.0–400.0)
RBC: 4.31 Mil/uL (ref 3.87–5.11)
RDW: 16.9 % — ABNORMAL HIGH (ref 11.5–15.5)
WBC: 6.4 10*3/uL (ref 4.0–10.5)

## 2015-02-22 LAB — COMPREHENSIVE METABOLIC PANEL
ALT: 12 U/L (ref 0–35)
AST: 14 U/L (ref 0–37)
Albumin: 4 g/dL (ref 3.5–5.2)
Alkaline Phosphatase: 62 U/L (ref 39–117)
BUN: 26 mg/dL — ABNORMAL HIGH (ref 6–23)
CALCIUM: 9 mg/dL (ref 8.4–10.5)
CO2: 34 meq/L — AB (ref 19–32)
Chloride: 102 mEq/L (ref 96–112)
Creatinine, Ser: 1.19 mg/dL (ref 0.40–1.20)
GFR: 46.94 mL/min — AB (ref >60.00)
GLUCOSE: 96 mg/dL (ref 70–99)
POTASSIUM: 4.1 meq/L (ref 3.5–5.1)
Sodium: 141 mEq/L (ref 135–145)
Total Bilirubin: 0.3 mg/dL (ref 0.2–1.2)
Total Protein: 6.6 g/dL (ref 6.0–8.3)

## 2015-02-22 MED ORDER — VALSARTAN 320 MG PO TABS
160.0000 mg | ORAL_TABLET | Freq: Every day | ORAL | Status: DC
Start: 2015-02-22 — End: 2015-05-04

## 2015-02-22 NOTE — Patient Instructions (Signed)
Thank you for choosing [facility].  Summary/Instructions:  Please STOP taking valsartan-hydrochlorothiazide  Please START taking valsartan and furosemide (40 mg daily)  Your prescription(s) have been submitted to your pharmacy or been printed and provided for you. Please take as directed and contact our office if you believe you are having problem(s) with the medication(s) or have any questions.  Please stop by the lab on the basement level of the building for your blood work. Your results will be released to [place] (or called to you) after review, usually within 72 hours after test completion. If any changes need to be made, you will be notified at that same time.  Referrals have been made during this visit. You should expect to hear back from our schedulers in about 7-10 days in regards to establishing an appointment with the specialists we discussed.   If your symptoms worsen or fail to improve, please contact our office for further instruction, or in case of emergency go directly to the emergency room at the closest medical facility.

## 2015-02-22 NOTE — Assessment & Plan Note (Signed)
Paroxysmal atrial fibrillation anticoagulated with Coumadin. Currently maintained on alternating 5 mg and 2.5 milligrams daily. Obtain INR. Referred to Coumadin clinic for further management. Continue current Coumadin dosage pending INR results.

## 2015-02-22 NOTE — Progress Notes (Signed)
Pre visit review using our clinic review tool, if applicable. No additional management support is needed unless otherwise documented below in the visit note. 

## 2015-02-22 NOTE — Progress Notes (Signed)
Subjective:    Patient ID: [name], female    DOB: 13-Feb-1941, 74 y.o.   MRN: [id number]  Chief Complaint  Patient presents with  . Hospitalization Follow-up    INR check, states she went to the hospital for SOB states that they found fluid in her lungs and gave her blood bc hemoglobin was low, she says that she is still having some SOB but not as bad as it was, also having some fatigue    HPI:  [name] is a 74 y.o. female with a PMH of paroxysmal atrial fibrillation, congestive heart failure, hypertension, benign neoplasm of the colon, and upper GI bleed who presents today for a hospitalization follow up.   Recently evaluated in the emergency room and admitted to the hospital for progressively worsening shortness of breath. Which was exacerbated by lying flat and with exertion. Workup was concerning for pulmonary edema and drop in hemoglobin. She was transfused with 2 units of packed red blood cells. There was concern for upper gastrointestinal bleeding. A colonoscopy and endoscopy revealed no source of GI bleeding, mild diverticulosis, esophageal stricture, and a large hiatal hernia. It was deemed safe for her to restart her Coumadin. Her shortness of breath was attributed to possible atelectasis. She did receive intravenous furosemide. CT of the chest was unremarkable and shortness of breath was improved upon discharge. She was discharged home with injections to follow-up for INR monitoring of her Coumadin. All hospital records reviewed in detail.   Since leaving the hospital she reports some improvement in her breathing but does report she still has associated symptom of shortness of breath on occasion. Reports that her activities of daily living are back to baseline and able to complete without complication. She is eating okay. Has restarted her Coumadin and is currently alternating 5 mg and 2.5 mg. Denies any fevers, chills, chest pain, or nuisance bleeding.  Allergies    Allergen Reactions  . Iodinated Diagnostic Agents Anaphylaxis    Info given by patient  . Milk-Related Compounds     Lactose intolerance  . Darvon [Propoxyphene] Rash  . Hydralazine Anxiety    Facial flushing, pt prefers not to use it.     Current Outpatient Prescriptions on File Prior to Visit  Medication Sig Dispense Refill  . amLODipine (NORVASC) 10 MG tablet Take 1 tablet (10 mg total) by mouth daily. 90 tablet 3  . butalbital-acetaminophen-caffeine (FIORICET, ESGIC) 50-325-40 MG per tablet Take 1 tablet by mouth every 4 (four) hours as needed for headache. 14 tablet 0  . cloNIDine (CATAPRES) 0.1 MG tablet Take 1 tablet (0.1 mg total) by mouth daily. (Patient taking differently: Take 0.1 mg by mouth daily as needed (high blood pressure, will take if levels are 180/90). ) 30 tablet 0  . furosemide (LASIX) 40 MG tablet Take 1-2 tablets (40-80 mg total) by mouth daily. (Patient not taking: Reported on 02/12/2015) 30 tablet 1  . gabapentin (NEURONTIN) 100 MG capsule Take 1 capsule (100 mg total) by mouth 3 (three) times daily. 90 capsule 3  . meclizine (ANTIVERT) 25 MG tablet Take 1 tablet (25 mg total) by mouth 3 (three) times daily as needed for dizziness. 30 tablet 0  . metoCLOPramide (REGLAN) 10 MG tablet Take 10 mg by mouth every 6 (six) hours as needed for nausea.    . metoprolol (LOPRESSOR) 25 MG tablet Take 1 tablet (25 mg total) by mouth 2 (two) times daily. 60 tablet 0  . metroNIDAZOLE (FLAGYL) 500 MG  tablet Take 1 tablet (500 mg total) by mouth 3 (three) times daily. (Patient not taking: Reported on 02/12/2015) 30 tablet 0  . ondansetron (ZOFRAN) 4 MG tablet Take 1 tablet (4 mg total) by mouth every 8 (eight) hours as needed for nausea or vomiting. 20 tablet 0  . oxycodone (OXY-IR) 5 MG capsule Take 1 capsule (5 mg total) by mouth 2 (two) times daily as needed for pain. 60 capsule 0  . pantoprazole (PROTONIX) 40 MG tablet Take 1 tablet (40 mg total) by mouth daily. 90 tablet 3  .  pantoprazole (PROTONIX) 40 MG tablet Take 1 tablet (40 mg total) by mouth daily. 30 tablet 1  . polyethylene glycol (MIRALAX / GLYCOLAX) packet Take 17 g by mouth daily as needed for moderate constipation. 14 each 0  . simvastatin (ZOCOR) 20 MG tablet Take 1 tablet (20 mg total) by mouth daily. 90 tablet 3  . temazepam (RESTORIL) 15 MG capsule Take 1 capsule (15 mg total) by mouth at bedtime. 30 capsule 1  . tizanidine (ZANAFLEX) 2 MG capsule Take 2 mg by mouth every 6 (six) hours as needed for muscle spasms.    . traMADol (ULTRAM) 50 MG tablet Take 1 tablet (50 mg total) by mouth every 6 (six) hours as needed. (Patient taking differently: Take 50 mg by mouth every 6 (six) hours as needed for moderate pain. ) 30 tablet 0  . vitamin B-12 (CYANOCOBALAMIN) 1000 MCG tablet Take 1,000 mcg by mouth daily.    [name] warfarin (COUMADIN) 5 MG tablet 5 mg on Monday Wednesday Friday and Sunday, 2.5 mg Tuesday Thursday Saturday  INR check 8/22     No current facility-administered medications on file prior to visit.     Review of Systems  Constitutional: Negative for fever and chills.  Respiratory: Positive for shortness of breath. Negative for cough and chest tightness.   Cardiovascular: Negative for chest pain, palpitations and leg swelling.  Neurological: Negative for dizziness.      Objective:    BP 138/68 mmHg  Pulse 67  Temp(Src) 98.1 F (36.7 C) (Oral)  Resp 18  Ht 5\' 2"  (1.575 m)  Wt 134 lb (60.782 kg)  BMI 24.50 kg/m2  SpO2 97% Nursing note and vital signs reviewed.  Physical Exam  Constitutional: She is oriented to person, place, and time. She appears well-developed and well-nourished. No distress.  Cardiovascular: Normal rate, regular rhythm, normal heart sounds and intact distal pulses.   Pulmonary/Chest: Effort normal and breath sounds normal.  Neurological: She is alert and oriented to person, place, and time.  Skin: Skin is warm and dry.  Psychiatric: She has a normal mood and  affect. Her behavior is normal. Judgment and thought content normal.       Assessment & Plan:   Problem List Items Addressed This Visit      Cardiovascular and Mediastinum   Paroxysmal atrial fibrillation    Paroxysmal atrial fibrillation anticoagulated with Coumadin. Currently maintained on alternating 5 mg and 2.5 milligrams daily. Obtain INR. Referred to Coumadin clinic for further management. Continue current Coumadin dosage pending INR results.      Relevant Medications   valsartan (DIOVAN) 320 MG tablet   Other Relevant Orders   Ambulatory referral to Anticoagulation Monitoring     Digestive   UGI bleed    No evidence of upper gastrointestinal bleed noted in the hospital. Obtain CBC. Obtain Hemoccult stool cards. Follow-up pending CBC and Hemoccult results.      Relevant Orders  INR/PT (Completed)   Hemoccult Cards (X3 cards)     Other   Shortness of breath - Primary    Continues to experience mild shortness of breath of undetermined origin. Question possible fluid overload versus anemia. She is in no distress. Adjust fluid management medications. Discontinue valsartan-hydrochlorothiazide. Start valsartan independently. Continue current dosage of furosemide. Follow-up in 2 weeks for basic metabolic panel.      Relevant Orders   CBC (Completed)   Reticulocyte Count (SLN)   Comprehensive metabolic panel (Completed)

## 2015-02-22 NOTE — Assessment & Plan Note (Signed)
Continues to experience mild shortness of breath of undetermined origin. Question possible fluid overload versus anemia. She is in no distress. Adjust fluid management medications. Discontinue valsartan-hydrochlorothiazide. Start valsartan independently. Continue current dosage of furosemide. Follow-up in 2 weeks for basic metabolic panel.

## 2015-02-22 NOTE — Assessment & Plan Note (Signed)
No evidence of upper gastrointestinal bleed noted in the hospital. Obtain CBC. Obtain Hemoccult stool cards. Follow-up pending CBC and Hemoccult results.

## 2015-02-23 ENCOUNTER — Ambulatory Visit (INDEPENDENT_AMBULATORY_CARE_PROVIDER_SITE_OTHER): Payer: Medicare Other | Admitting: General Practice

## 2015-02-23 ENCOUNTER — Encounter: Payer: Self-pay | Admitting: Family

## 2015-02-23 DIAGNOSIS — Z5181 Encounter for therapeutic drug level monitoring: Secondary | ICD-10-CM

## 2015-02-23 DIAGNOSIS — I480 Paroxysmal atrial fibrillation: Secondary | ICD-10-CM

## 2015-02-23 NOTE — Progress Notes (Signed)
I have reviewed and agree with the plan. 

## 2015-02-23 NOTE — Progress Notes (Signed)
Pre visit review using our clinic review tool, if applicable. No additional management support is needed unless otherwise documented below in the visit note. 

## 2015-02-24 ENCOUNTER — Ambulatory Visit: Payer: Medicare Other | Admitting: Gastroenterology

## 2015-02-24 ENCOUNTER — Encounter: Payer: Self-pay | Admitting: Gastroenterology

## 2015-02-25 ENCOUNTER — Other Ambulatory Visit: Payer: Self-pay | Admitting: Family

## 2015-02-25 ENCOUNTER — Telehealth: Payer: Self-pay | Admitting: *Deleted

## 2015-02-25 NOTE — Telephone Encounter (Signed)
L/M for patient of new date and time for her follow up appointment with Dr Deatra Ina due to [facility] being down she could not see Dr Deatra Ina during her original appointment

## 2015-03-02 ENCOUNTER — Ambulatory Visit (INDEPENDENT_AMBULATORY_CARE_PROVIDER_SITE_OTHER): Payer: Medicare Other | Admitting: General Practice

## 2015-03-02 DIAGNOSIS — I480 Paroxysmal atrial fibrillation: Secondary | ICD-10-CM | POA: Diagnosis not present

## 2015-03-02 DIAGNOSIS — Z5181 Encounter for therapeutic drug level monitoring: Secondary | ICD-10-CM

## 2015-03-02 DIAGNOSIS — Z86711 Personal history of pulmonary embolism: Secondary | ICD-10-CM | POA: Diagnosis not present

## 2015-03-02 LAB — POCT INR: INR: 3

## 2015-03-02 NOTE — Progress Notes (Signed)
Pre visit review using our clinic review tool, if applicable. No additional management support is needed unless otherwise documented below in the visit note. 

## 2015-03-02 NOTE — Progress Notes (Signed)
I have reviewed and agree with the plan. 

## 2015-03-03 ENCOUNTER — Ambulatory Visit (HOSPITAL_COMMUNITY): Payer: Medicare Other | Attending: Cardiovascular Disease

## 2015-03-07 ENCOUNTER — Encounter: Payer: Self-pay | Admitting: Family

## 2015-03-09 ENCOUNTER — Other Ambulatory Visit (INDEPENDENT_AMBULATORY_CARE_PROVIDER_SITE_OTHER): Payer: Medicare Other

## 2015-03-09 ENCOUNTER — Encounter: Payer: Self-pay | Admitting: Gastroenterology

## 2015-03-09 ENCOUNTER — Ambulatory Visit (INDEPENDENT_AMBULATORY_CARE_PROVIDER_SITE_OTHER): Payer: Medicare Other | Admitting: Gastroenterology

## 2015-03-09 VITALS — BP 110/56 | HR 60 | Ht 62.0 in | Wt 135.0 lb

## 2015-03-09 DIAGNOSIS — K922 Gastrointestinal hemorrhage, unspecified: Secondary | ICD-10-CM

## 2015-03-09 LAB — CBC WITH DIFFERENTIAL/PLATELET
BASOS PCT: 0.4 % (ref 0.0–3.0)
Basophils Absolute: 0 10*3/uL (ref 0.0–0.1)
EOS PCT: 2.4 % (ref 0.0–5.0)
Eosinophils Absolute: 0.1 10*3/uL (ref 0.0–0.7)
HEMATOCRIT: 35 % — AB (ref 36.0–46.0)
HEMOGLOBIN: 11.4 g/dL — AB (ref 12.0–15.0)
LYMPHS PCT: 29.8 % (ref 12.0–46.0)
Lymphs Abs: 1.5 10*3/uL (ref 0.7–4.0)
MCHC: 32.6 g/dL (ref 30.0–36.0)
MCV: 79.5 fl (ref 78.0–100.0)
MONO ABS: 0.6 10*3/uL (ref 0.1–1.0)
Monocytes Relative: 11.2 % (ref 3.0–12.0)
NEUTROS ABS: 2.8 10*3/uL (ref 1.4–7.7)
Neutrophils Relative %: 56.2 % (ref 43.0–77.0)
Platelets: 181 10*3/uL (ref 150.0–400.0)
RBC: 4.4 Mil/uL (ref 3.87–5.11)
RDW: 17.3 % — AB (ref 11.5–15.5)
WBC: 5 10*3/uL (ref 4.0–10.5)

## 2015-03-09 NOTE — Patient Instructions (Signed)
Your physician has requested that you go to the basement for  lab work before leaving today.   

## 2015-03-09 NOTE — Progress Notes (Signed)
      History of Present Illness:  [name] has returned following hospitalization for an acute GI bleed.  This was in the setting of an INR of 3.5.  Upper endoscopy was negative for bleeding source.  A hamartomatous polyp was removed at colonoscopy.  Since discharge she has felt well and has had no GI complaints including rectal bleeding or abdominal pain.  Coumadin was restarted.    Review of Systems: She had one episode of rapid heart rate while at home but denied dizziness.  Pertinent positive and negative review of systems were noted in the above HPI section. All other review of systems were otherwise negative.    Current Medications, Allergies, Past Medical History, Past Surgical History, Family History and Social History were reviewed in [place] record  Vital signs were reviewed in today's medical record. Physical Exam: General: Well developed , well nourished, no acute distress  See Assessment and Plan under Problem List

## 2015-03-09 NOTE — Assessment & Plan Note (Signed)
Etiology for bleeding source was not determined although, with a supratherapeutic INR (3.65) spontaneous bleeding can occur.  Recommendations #1 follow-up Hemoccults and CBC; if Hemoccults are positive will proceed with capsule endoscopy

## 2015-03-18 ENCOUNTER — Telehealth: Payer: Self-pay | Admitting: *Deleted

## 2015-03-18 MED ORDER — OXYCODONE HCL 5 MG PO CAPS: 5.0000 mg | ORAL_CAPSULE | Freq: Two times a day (BID) | ORAL | Status: DC | PRN

## 2015-03-18 MED ORDER — TRAMADOL HCL 50 MG PO TABS: 50.0000 mg | ORAL_TABLET | Freq: Four times a day (QID) | ORAL | Status: DC | PRN

## 2015-03-18 NOTE — Telephone Encounter (Signed)
Called pt no answer [facility] rx ready for pick-up.../lmb 

## 2015-03-18 NOTE — Telephone Encounter (Signed)
Left msg on triage requesting refills on her pain meds oxycodone & tramadol...[name]

## 2015-03-18 NOTE — Telephone Encounter (Signed)
Medications refilled for pickup.

## 2015-03-19 ENCOUNTER — Telehealth: Payer: Self-pay

## 2015-03-19 NOTE — Telephone Encounter (Signed)
Error

## 2015-03-31 ENCOUNTER — Ambulatory Visit (INDEPENDENT_AMBULATORY_CARE_PROVIDER_SITE_OTHER): Payer: Medicare Other | Admitting: *Deleted

## 2015-03-31 DIAGNOSIS — Z5181 Encounter for therapeutic drug level monitoring: Secondary | ICD-10-CM | POA: Diagnosis not present

## 2015-03-31 DIAGNOSIS — I480 Paroxysmal atrial fibrillation: Secondary | ICD-10-CM

## 2015-03-31 DIAGNOSIS — Z86711 Personal history of pulmonary embolism: Secondary | ICD-10-CM

## 2015-03-31 LAB — POCT INR: INR: 3.6

## 2015-03-31 NOTE — Progress Notes (Signed)
Pre visit review using our clinic review tool, if applicable. No additional management support is needed unless otherwise documented below in the visit note. 

## 2015-03-31 NOTE — Progress Notes (Signed)
I have reviewed and agree with the plan. 

## 2015-04-08 ENCOUNTER — Other Ambulatory Visit: Payer: Self-pay | Admitting: Family

## 2015-04-14 ENCOUNTER — Ambulatory Visit (INDEPENDENT_AMBULATORY_CARE_PROVIDER_SITE_OTHER): Payer: Medicare Other | Admitting: General Practice

## 2015-04-14 DIAGNOSIS — Z86711 Personal history of pulmonary embolism: Secondary | ICD-10-CM

## 2015-04-14 DIAGNOSIS — Z5181 Encounter for therapeutic drug level monitoring: Secondary | ICD-10-CM

## 2015-04-14 DIAGNOSIS — I480 Paroxysmal atrial fibrillation: Secondary | ICD-10-CM | POA: Diagnosis not present

## 2015-04-14 LAB — POCT INR: INR: 2.9

## 2015-04-14 NOTE — Progress Notes (Signed)
Pre visit review using our clinic review tool, if applicable. No additional management support is needed unless otherwise documented below in the visit note. 

## 2015-04-14 NOTE — Progress Notes (Signed)
I have reviewed and agree with the plan. 

## 2015-04-16 ENCOUNTER — Encounter: Payer: Self-pay | Admitting: Family

## 2015-04-16 ENCOUNTER — Ambulatory Visit (INDEPENDENT_AMBULATORY_CARE_PROVIDER_SITE_OTHER): Payer: Medicare Other | Admitting: Family

## 2015-04-16 VITALS — BP 128/60 | HR 62 | Temp 97.9°F | Resp 18 | Ht 62.0 in | Wt 136.0 lb

## 2015-04-16 DIAGNOSIS — M1990 Unspecified osteoarthritis, unspecified site: Secondary | ICD-10-CM | POA: Insufficient documentation

## 2015-04-16 DIAGNOSIS — M150 Primary generalized (osteo)arthritis: Secondary | ICD-10-CM

## 2015-04-16 DIAGNOSIS — G479 Sleep disorder, unspecified: Secondary | ICD-10-CM | POA: Diagnosis not present

## 2015-04-16 DIAGNOSIS — M159 Polyosteoarthritis, unspecified: Secondary | ICD-10-CM

## 2015-04-16 MED ORDER — TRAMADOL HCL 50 MG PO TABS: 50.0000 mg | ORAL_TABLET | Freq: Three times a day (TID) | ORAL | Status: DC | PRN

## 2015-04-16 MED ORDER — TEMAZEPAM 15 MG PO CAPS: 15.0000 mg | ORAL_CAPSULE | Freq: Every day | ORAL | Status: DC

## 2015-04-16 MED ORDER — OXYCODONE HCL 5 MG PO CAPS: 5.0000 mg | ORAL_CAPSULE | Freq: Two times a day (BID) | ORAL | Status: DC | PRN

## 2015-04-16 MED ORDER — NONFORMULARY OR COMPOUNDED ITEM: Status: DC

## 2015-04-16 NOTE — Assessment & Plan Note (Signed)
Symptoms and exam consistent with osteoarthritis which is refractory to current pain medications including oxycodone and tramadol. Does describe a cream that she obtained from an outside party that does seem to help. Prescription written for compounding of the cream. Consider possible referral to rheumatology or orthopedics for joint injections as majority of discomfort is located in her thumbs. Follow-up after pending trial of cream to determine effectiveness.

## 2015-04-16 NOTE — Assessment & Plan Note (Signed)
Sleep disturbance moderately controlled with temazepam with no adverse side effects. Continue current dosage of temazepam.

## 2015-04-16 NOTE — Progress Notes (Signed)
Pre visit review using our clinic review tool, if applicable. No additional management support is needed unless otherwise documented below in the visit note. 

## 2015-04-16 NOTE — Patient Instructions (Signed)
Thank you for choosing [facility].  Summary/Instructions:  Your prescription(s) have been submitted to your pharmacy or been printed and provided for you. Please take as directed and contact our office if you believe you are having problem(s) with the medication(s) or have any questions.  If your symptoms worsen or fail to improve, please contact our office for further instruction, or in case of emergency go directly to the emergency room at the closest medical facility.   Osteoarthritis Osteoarthritis is a disease that causes soreness and inflammation of a joint. It occurs when the cartilage at the affected joint wears down. Cartilage acts as a cushion, covering the ends of bones where they meet to form a joint. Osteoarthritis is the most common form of arthritis. It often occurs in older people. The joints affected most often by this condition include those in the:  Ends of the fingers.  Thumbs.  Neck.  Lower back.  Knees.  Hips. CAUSES  Over time, the cartilage that covers the ends of bones begins to wear away. This causes bone to rub on bone, producing pain and stiffness in the affected joints.  RISK FACTORS Certain factors can increase your chances of having osteoarthritis, including:  Older age.  Excessive body weight.  Overuse of joints.  Previous joint injury. SIGNS AND SYMPTOMS   Pain, swelling, and stiffness in the joint.  Over time, the joint may lose its normal shape.  Small deposits of bone (osteophytes) may grow on the edges of the joint.  Bits of bone or cartilage can break off and float inside the joint space. This may cause more pain and damage. DIAGNOSIS  Your health care provider will do a physical exam and ask about your symptoms. Various tests may be ordered, such as:  X-rays of the affected joint.  Blood tests to rule out other types of arthritis. Additional tests may be used to diagnose your condition. TREATMENT  Goals of treatment are  to control pain and improve joint function. Treatment plans may include:  A prescribed exercise program that allows for rest and joint relief.  A weight control plan.  Pain relief techniques, such as:  Properly applied heat and cold.  Electric pulses delivered to nerve endings under the skin (transcutaneous electrical nerve stimulation [TENS]).  Massage.  Certain nutritional supplements.  Medicines to control pain, such as:  Acetaminophen.  Nonsteroidal anti-inflammatory drugs (NSAIDs), such as naproxen.  Narcotic or central-acting agents, such as tramadol.  Corticosteroids. These can be given orally or as an injection.  Surgery to reposition the bones and relieve pain (osteotomy) or to remove loose pieces of bone and cartilage. Joint replacement may be needed in advanced states of osteoarthritis. HOME CARE INSTRUCTIONS   Take medicines only as directed by your health care provider.  Maintain a healthy weight. Follow your health care provider's instructions for weight control. This may include dietary instructions.  Exercise as directed. Your health care provider can recommend specific types of exercise. These may include:  Strengthening exercises. These are done to strengthen the muscles that support joints affected by arthritis. They can be performed with weights or with exercise bands to add resistance.  Aerobic activities. These are exercises, such as brisk walking or low-impact aerobics, that get your heart pumping.  Range-of-motion activities. These keep your joints limber.  Balance and agility exercises. These help you maintain daily living skills.  Rest your affected joints as directed by your health care provider.  Keep all follow-up visits as directed by your  health care provider. SEEK MEDICAL CARE IF:   Your skin turns red.  You develop a rash in addition to your joint pain.  You have worsening joint pain.  You have a fever along with joint or muscle  aches. SEEK IMMEDIATE MEDICAL CARE IF:  You have a significant loss of weight or appetite.  You have night sweats. [place] of Arthritis and Musculoskeletal and Skin Diseases: www.niams.SouthExposed.es  [employer] on Aging: http://kim-miller.com/  American College of Rheumatology: www.rheumatology.org   This information is not intended to replace advice given to you by your health care provider. Make sure you discuss any questions you have with your health care provider.   Document Released: 06/19/2005 Document Revised: 07/10/2014 Document Reviewed: 02/24/2013 Elsevier Interactive Patient Education [employer].

## 2015-04-16 NOTE — Progress Notes (Signed)
Subjective:    Patient ID: [name], female    DOB: Nov 04, 1940, 74 y.o.   MRN: [id number]  Chief Complaint  Patient presents with  . Hand Pain    Refill of tramadol and tamazepam, has had hand pain that is so bad from arthritis, a friend gave her a hand cream to use and it had helped    HPI:  [name] is a 74 y.o. female who  has a past medical history of CHF (congestive heart failure) ([place]); Hypertension; Anemia; Broken back (2013); Atrial fibrillation ([place]); GERD (gastroesophageal reflux disease); Arthritis; and Stroke ([place]). and presents today for an acute office visit.  1.) Arthritic pain - Associated symptom of pain located in her hands and back continues to be an issue and believes that her pain is continuing to increase. Pain is currently managed with Tramadol and oxycodone. She has also started using a compounded cream that she obtained from a friend that helps to make the pain in her hands more manageable. Would like a prescription for this medication if appropriate.   2.) Sleep disturbance - Currently taking temazepam and reports the medication is working okay. Takes the medication as prescribed and denies adverse side effects.  Allergies  Allergen Reactions  . Iodinated Diagnostic Agents Anaphylaxis    Info given by patient  . Milk-Related Compounds     Lactose intolerance  . Darvon [Propoxyphene] Rash  . Hydralazine Anxiety    Facial flushing, pt prefers not to use it.      Current Outpatient Prescriptions on File Prior to Visit  Medication Sig Dispense Refill  . amLODipine (NORVASC) 10 MG tablet Take 1 tablet (10 mg total) by mouth daily. 90 tablet 3  . butalbital-acetaminophen-caffeine (FIORICET, ESGIC) 50-325-40 MG per tablet Take 1 tablet by mouth every 4 (four) hours as needed for headache. 14 tablet 0  . cloNIDine (CATAPRES) 0.1 MG tablet Take 1 tablet (0.1 mg total) by mouth daily. (Patient taking differently: Take 0.1 mg by mouth daily as needed  (high blood pressure, will take if levels are 180/90). ) 30 tablet 0  . furosemide (LASIX) 40 MG tablet TAKE 1 TO 2 TABLETS BY MOUTH ONCE DAILY. 30 tablet 0  . gabapentin (NEURONTIN) 100 MG capsule TAKE 1 CAPSULE BY MOUTH THREE TIMES A DAY. 90 capsule 2  . meclizine (ANTIVERT) 25 MG tablet Take 1 tablet (25 mg total) by mouth 3 (three) times daily as needed for dizziness. 30 tablet 0  . metoCLOPramide (REGLAN) 10 MG tablet Take 10 mg by mouth every 6 (six) hours as needed for nausea.    . metoprolol (LOPRESSOR) 25 MG tablet Take 1 tablet (25 mg total) by mouth 2 (two) times daily. 60 tablet 0  . ondansetron (ZOFRAN) 4 MG tablet Take 1 tablet (4 mg total) by mouth every 8 (eight) hours as needed for nausea or vomiting. 20 tablet 0  . pantoprazole (PROTONIX) 40 MG tablet Take 1 tablet (40 mg total) by mouth daily. 90 tablet 3  . polyethylene glycol (MIRALAX / GLYCOLAX) packet Take 17 g by mouth daily as needed for moderate constipation. 14 each 0  . simvastatin (ZOCOR) 20 MG tablet Take 1 tablet (20 mg total) by mouth daily. 90 tablet 3  . tizanidine (ZANAFLEX) 2 MG capsule Take 2 mg by mouth every 6 (six) hours as needed for muscle spasms.    . valsartan (DIOVAN) 320 MG tablet Take 0.5 tablets (160 mg total) by mouth daily. 90 tablet 0  .  vitamin B-12 (CYANOCOBALAMIN) 1000 MCG tablet Take 1,000 mcg by mouth daily.    [name] warfarin (COUMADIN) 5 MG tablet 5 mg on Monday Wednesday Friday and Sunday, 2.5 mg Tuesday Thursday Saturday  INR check 8/22     No current facility-administered medications on file prior to visit.    Review of Systems  Constitutional: Negative for fever and chills.  Musculoskeletal: Positive for back pain and arthralgias.  Neurological: Negative for weakness and numbness.      Objective:    BP 128/60 mmHg  Pulse 62  Temp(Src) 97.9 F (36.6 C) (Oral)  Resp 18  Ht 5\' 2"  (1.575 m)  Wt 136 lb (61.689 kg)  BMI 24.87 kg/m2  SpO2 95% Nursing note and vital signs  reviewed.  Physical Exam  Constitutional: She is oriented to person, place, and time. She appears well-developed and well-nourished. No distress.  Cardiovascular: Normal rate, regular rhythm, normal heart sounds and intact distal pulses.   Pulmonary/Chest: Effort normal and breath sounds normal.  Musculoskeletal:  Bilateral hands: No obvious deformity, discoloration, or edema noted. Tenderness elicited over multiple MCP and PIP joints of her fingers. Displays full range of motion with some discomfort. Pulses and capillary refill are intact and appropriate.  Neurological: She is alert and oriented to person, place, and time.  Skin: Skin is warm and dry.  Psychiatric: She has a normal mood and affect. Her behavior is normal. Judgment and thought content normal.       Assessment & Plan:   Problem List Items Addressed This Visit      Musculoskeletal and Integument   Osteoarthritis - Primary    Symptoms and exam consistent with osteoarthritis which is refractory to current pain medications including oxycodone and tramadol. Does describe a cream that she obtained from an outside party that does seem to help. Prescription written for compounding of the cream. Consider possible referral to rheumatology or orthopedics for joint injections as majority of discomfort is located in her thumbs. Follow-up after pending trial of cream to determine effectiveness.      Relevant Medications   traMADol (ULTRAM) 50 MG tablet   oxycodone (OXY-IR) 5 MG capsule   NONFORMULARY OR COMPOUNDED ITEM     Other   Sleep disturbance    Sleep disturbance moderately controlled with temazepam with no adverse side effects. Continue current dosage of temazepam.      Relevant Medications   temazepam (RESTORIL) 15 MG capsule

## 2015-04-21 ENCOUNTER — Ambulatory Visit: Payer: Medicare Other | Admitting: Gastroenterology

## 2015-05-04 ENCOUNTER — Encounter (HOSPITAL_COMMUNITY): Payer: Self-pay

## 2015-05-04 ENCOUNTER — Inpatient Hospital Stay (HOSPITAL_COMMUNITY)
Admission: EM | Admit: 2015-05-04 | Discharge: 2015-05-08 | DRG: 378 | Disposition: A | Discharge status: Home / Self Care | Payer: Medicare Other | Attending: Internal Medicine | Admitting: Internal Medicine

## 2015-05-04 ENCOUNTER — Emergency Department (HOSPITAL_COMMUNITY): Payer: Medicare Other

## 2015-05-04 DIAGNOSIS — Z7901 Long term (current) use of anticoagulants: Secondary | ICD-10-CM | POA: Diagnosis not present

## 2015-05-04 DIAGNOSIS — M159 Polyosteoarthritis, unspecified: Secondary | ICD-10-CM

## 2015-05-04 DIAGNOSIS — K222 Esophageal obstruction: Secondary | ICD-10-CM

## 2015-05-04 DIAGNOSIS — K921 Melena: Secondary | ICD-10-CM | POA: Diagnosis not present

## 2015-05-04 DIAGNOSIS — K449 Diaphragmatic hernia without obstruction or gangrene: Secondary | ICD-10-CM | POA: Diagnosis present

## 2015-05-04 DIAGNOSIS — E782 Mixed hyperlipidemia: Secondary | ICD-10-CM | POA: Diagnosis present

## 2015-05-04 DIAGNOSIS — K5730 Diverticulosis of large intestine without perforation or abscess without bleeding: Secondary | ICD-10-CM | POA: Diagnosis present

## 2015-05-04 DIAGNOSIS — M545 Low back pain: Secondary | ICD-10-CM

## 2015-05-04 DIAGNOSIS — G479 Sleep disorder, unspecified: Secondary | ICD-10-CM

## 2015-05-04 DIAGNOSIS — C160 Malignant neoplasm of cardia: Secondary | ICD-10-CM | POA: Diagnosis not present

## 2015-05-04 DIAGNOSIS — I10 Essential (primary) hypertension: Secondary | ICD-10-CM

## 2015-05-04 DIAGNOSIS — R1030 Lower abdominal pain, unspecified: Secondary | ICD-10-CM | POA: Diagnosis not present

## 2015-05-04 DIAGNOSIS — G8929 Other chronic pain: Secondary | ICD-10-CM | POA: Diagnosis present

## 2015-05-04 DIAGNOSIS — M549 Dorsalgia, unspecified: Secondary | ICD-10-CM | POA: Diagnosis present

## 2015-05-04 DIAGNOSIS — K922 Gastrointestinal hemorrhage, unspecified: Secondary | ICD-10-CM

## 2015-05-04 DIAGNOSIS — R112 Nausea with vomiting, unspecified: Secondary | ICD-10-CM | POA: Diagnosis not present

## 2015-05-04 DIAGNOSIS — C159 Malignant neoplasm of esophagus, unspecified: Secondary | ICD-10-CM

## 2015-05-04 DIAGNOSIS — Z79899 Other long term (current) drug therapy: Secondary | ICD-10-CM

## 2015-05-04 DIAGNOSIS — R52 Pain, unspecified: Secondary | ICD-10-CM

## 2015-05-04 DIAGNOSIS — F329 Major depressive disorder, single episode, unspecified: Secondary | ICD-10-CM | POA: Diagnosis present

## 2015-05-04 DIAGNOSIS — D62 Acute posthemorrhagic anemia: Secondary | ICD-10-CM | POA: Diagnosis present

## 2015-05-04 DIAGNOSIS — I110 Hypertensive heart disease with heart failure: Secondary | ICD-10-CM | POA: Diagnosis not present

## 2015-05-04 DIAGNOSIS — D509 Iron deficiency anemia, unspecified: Secondary | ICD-10-CM | POA: Insufficient documentation

## 2015-05-04 DIAGNOSIS — I5032 Chronic diastolic (congestive) heart failure: Secondary | ICD-10-CM | POA: Diagnosis present

## 2015-05-04 DIAGNOSIS — Z5181 Encounter for therapeutic drug level monitoring: Secondary | ICD-10-CM

## 2015-05-04 DIAGNOSIS — R195 Other fecal abnormalities: Secondary | ICD-10-CM | POA: Insufficient documentation

## 2015-05-04 DIAGNOSIS — R109 Unspecified abdominal pain: Secondary | ICD-10-CM | POA: Diagnosis not present

## 2015-05-04 DIAGNOSIS — H3530 Unspecified macular degeneration: Secondary | ICD-10-CM

## 2015-05-04 DIAGNOSIS — Z86711 Personal history of pulmonary embolism: Secondary | ICD-10-CM

## 2015-05-04 DIAGNOSIS — D124 Benign neoplasm of descending colon: Secondary | ICD-10-CM

## 2015-05-04 DIAGNOSIS — K253 Acute gastric ulcer without hemorrhage or perforation: Secondary | ICD-10-CM | POA: Insufficient documentation

## 2015-05-04 DIAGNOSIS — H8110 Benign paroxysmal vertigo, unspecified ear: Secondary | ICD-10-CM

## 2015-05-04 DIAGNOSIS — F419 Anxiety disorder, unspecified: Secondary | ICD-10-CM | POA: Diagnosis present

## 2015-05-04 DIAGNOSIS — E785 Hyperlipidemia, unspecified: Secondary | ICD-10-CM | POA: Diagnosis present

## 2015-05-04 DIAGNOSIS — M1990 Unspecified osteoarthritis, unspecified site: Secondary | ICD-10-CM | POA: Diagnosis present

## 2015-05-04 DIAGNOSIS — R1084 Generalized abdominal pain: Secondary | ICD-10-CM | POA: Diagnosis not present

## 2015-05-04 DIAGNOSIS — M150 Primary generalized (osteo)arthritis: Secondary | ICD-10-CM

## 2015-05-04 DIAGNOSIS — R197 Diarrhea, unspecified: Secondary | ICD-10-CM

## 2015-05-04 DIAGNOSIS — R0602 Shortness of breath: Secondary | ICD-10-CM

## 2015-05-04 DIAGNOSIS — R791 Abnormal coagulation profile: Secondary | ICD-10-CM

## 2015-05-04 DIAGNOSIS — R1110 Vomiting, unspecified: Secondary | ICD-10-CM

## 2015-05-04 DIAGNOSIS — I480 Paroxysmal atrial fibrillation: Secondary | ICD-10-CM | POA: Diagnosis present

## 2015-05-04 DIAGNOSIS — Z8673 Personal history of transient ischemic attack (TIA), and cerebral infarction without residual deficits: Secondary | ICD-10-CM

## 2015-05-04 DIAGNOSIS — D649 Anemia, unspecified: Secondary | ICD-10-CM | POA: Diagnosis not present

## 2015-05-04 DIAGNOSIS — R0789 Other chest pain: Secondary | ICD-10-CM

## 2015-05-04 DIAGNOSIS — K219 Gastro-esophageal reflux disease without esophagitis: Secondary | ICD-10-CM

## 2015-05-04 DIAGNOSIS — I350 Nonrheumatic aortic (valve) stenosis: Secondary | ICD-10-CM

## 2015-05-04 DIAGNOSIS — I5030 Unspecified diastolic (congestive) heart failure: Secondary | ICD-10-CM | POA: Diagnosis present

## 2015-05-04 DIAGNOSIS — Z91041 Radiographic dye allergy status: Secondary | ICD-10-CM

## 2015-05-04 HISTORY — DX: Diaphragmatic hernia without obstruction or gangrene: K44.9

## 2015-05-04 HISTORY — DX: Other pulmonary embolism without acute cor pulmonale: I26.99

## 2015-05-04 LAB — BASIC METABOLIC PANEL
Anion gap: 10 (ref 5–15)
BUN: 24 mg/dL — ABNORMAL HIGH (ref 6–20)
CO2: 27 mmol/L (ref 22–32)
Calcium: 9.1 mg/dL (ref 8.9–10.3)
Chloride: 102 mmol/L (ref 101–111)
Creatinine, Ser: 1.03 mg/dL — ABNORMAL HIGH (ref 0.44–1.00)
GFR calc Af Amer: >60 mL/min (ref >60)
GFR calc non Af Amer: 52 mL/min — ABNORMAL LOW (ref >60)
Glucose, Bld: 154 mg/dL — ABNORMAL HIGH (ref 65–99)
Potassium: 4.3 mmol/L (ref 3.5–5.1)
Sodium: 139 mmol/L (ref 135–145)

## 2015-05-04 LAB — CBC
HCT: 29.4 % — ABNORMAL LOW (ref 36.0–46.0)
Hemoglobin: 8.8 g/dL — ABNORMAL LOW (ref 12.0–15.0)
MCH: 22.3 pg — ABNORMAL LOW (ref 26.0–34.0)
MCHC: 29.9 g/dL — ABNORMAL LOW (ref 30.0–36.0)
MCV: 74.4 fL — ABNORMAL LOW (ref 78.0–100.0)
Platelets: 289 10*3/uL (ref 150–400)
RBC: 3.95 MIL/uL (ref 3.87–5.11)
RDW: 15.6 % — ABNORMAL HIGH (ref 11.5–15.5)
WBC: 9.1 10*3/uL (ref 4.0–10.5)

## 2015-05-04 LAB — CBG MONITORING, ED: Glucose-Capillary: 167 mg/dL — ABNORMAL HIGH (ref 65–99)

## 2015-05-04 LAB — URINALYSIS, ROUTINE W REFLEX MICROSCOPIC
Bilirubin Urine: NEGATIVE
Glucose, UA: NEGATIVE mg/dL
Hgb urine dipstick: NEGATIVE
Ketones, ur: 15 mg/dL — AB
Leukocytes, UA: NEGATIVE
Nitrite: NEGATIVE
Protein, ur: NEGATIVE mg/dL
Specific Gravity, Urine: 1.017 (ref 1.005–1.030)
Urobilinogen, UA: 0.2 mg/dL (ref 0.0–1.0)
pH: 5 (ref 5.0–8.0)

## 2015-05-04 LAB — PROTIME-INR
INR: 2.85 — ABNORMAL HIGH (ref 0.00–1.49)
Prothrombin Time: 29.4 seconds — ABNORMAL HIGH (ref 11.6–15.2)

## 2015-05-04 LAB — POC OCCULT BLOOD, ED: Fecal Occult Bld: NEGATIVE

## 2015-05-04 LAB — I-STAT TROPONIN, ED: Troponin i, poc: 0.01 ng/mL (ref 0.00–0.08)

## 2015-05-04 MED ORDER — SODIUM CHLORIDE 0.9 % IV SOLN
INTRAVENOUS | Status: DC
  Administered 2015-05-04: 21:00:00 via INTRAVENOUS

## 2015-05-04 MED ORDER — ONDANSETRON HCL 4 MG/2ML IJ SOLN
4.0000 mg | Freq: Once | INTRAMUSCULAR | Status: AC
  Administered 2015-05-04: 4 mg via INTRAVENOUS
  Filled 2015-05-04: qty 2

## 2015-05-04 MED ORDER — BARIUM SULFATE 2.1 % PO SUSP
ORAL | Status: AC
  Filled 2015-05-04: qty 2

## 2015-05-04 MED ORDER — MORPHINE SULFATE (PF) 4 MG/ML IV SOLN
6.0000 mg | Freq: Once | INTRAVENOUS | Status: AC
  Administered 2015-05-04: 6 mg via INTRAVENOUS
  Filled 2015-05-04: qty 2

## 2015-05-04 NOTE — ED Notes (Signed)
Pt vomiting the contrast, CT made aware. Dr. Wilson Singer aware and okay to go ahead and scan the pt. CT notified.

## 2015-05-04 NOTE — ED Notes (Signed)
Pt reports nausea and lightheaded today associated with abdominal pain, pain to left hand, and back. No vomiting, just nausea. She took 2 "nausea medications" today but cant remember what she took. She denies LOC but states she felt like she was going to pass out today.

## 2015-05-04 NOTE — ED Provider Notes (Signed)
CSN: [id number]     Arrival date & time 05/04/15  1835 History   First MD Initiated Contact with Patient 05/04/15 2027     Chief Complaint  Patient presents with  . Nausea  . Near Syncope     (Consider location/radiation/quality/duration/timing/severity/associated sxs/prior Treatment) HPI   74 year old female with multiple complaints. 2-3 day history of nausea. Unable to eat anything today. Any smell makes him feel sick. No vomiting. Diarrhea stool. In color. No gross blood. Feels dizzy as if she may pass out. Diffuse abdominal pain worse across lower abdomen. Constant. No appreciable exacerbating relieving factors. No urinary complaints. No fever but has felt chilled. Pain in her lower back which she describes as an ache. Reports history of "broken back" and this is a chronic issue, but currently hurting more than typically does. Cramping in her legs. Try taking Reglan at home with no improvement. Patient with admission to the hospital in August with anemia. She was evaluated by gastroenterology. No obvious GI source. Her hemoglobin stabilized. Warfarin was restarted.   Past Medical History  Diagnosis Date  . CHF (congestive heart failure) ([place])   . Hypertension   . Anemia   . Broken back 2013  . Atrial fibrillation ([place])   . GERD (gastroesophageal reflux disease)   . Arthritis   . Stroke [facility])    Past Surgical History  Procedure Laterality Date  . Lumbar disc surgery  1991  . Lumbar back surgery  2012  . Cervical disc surgery  2014  . Cholecystectomy  1980s  . Appendectomy  1950s  . Tonsillectomy and adenoidectomy  1960s  . Carpal tunnel release Bilateral 1990s  . Esophagogastroduodenoscopy N/A 02/16/2015    Procedure: ESOPHAGOGASTRODUODENOSCOPY (EGD);  Surgeon: [name], MD;  Location: [facility];  Service: Endoscopy;  Laterality: N/A;  . Colonoscopy N/A 02/17/2015    Procedure: COLONOSCOPY;  Surgeon: [name], MD;  Location: [facility];  Service: Endoscopy;   Laterality: N/A;   Family History  Problem Relation Age of Onset  . Stroke Mother   . Heart disease Mother   . Emphysema Father   . Ovarian cancer Sister   . Stroke Sister   . Other Child     died at birth   Social History  Substance Use Topics  . Smoking status: Never Smoker   . Smokeless tobacco: Never Used  . Alcohol Use: No   OB History    No data available     Review of Systems  All systems reviewed and negative, other than as noted in HPI.   Allergies  Iodinated diagnostic agents; Milk-related compounds; Darvon; and Hydralazine  Home Medications   Prior to Admission medications   Medication Sig Start Date End Date Taking? Authorizing Provider  amLODipine (NORVASC) 10 MG tablet Take 1 tablet (10 mg total) by mouth daily. 08/03/14   Golden Circle, [name]  butalbital-acetaminophen-caffeine (FIORICET, ESGIC) [phone] MG per tablet Take 1 tablet by mouth every 4 (four) hours as needed for headache. 08/28/14   Bonnielee Haff, MD  cloNIDine (CATAPRES) 0.1 MG tablet Take 1 tablet (0.1 mg total) by mouth daily. Patient taking differently: Take 0.1 mg by mouth daily as needed (high blood pressure, will take if levels are 180/90).  08/28/14   Bonnielee Haff, MD  furosemide (LASIX) 40 MG tablet TAKE 1 TO 2 TABLETS BY MOUTH ONCE DAILY. 04/09/15   Golden Circle, [name]  gabapentin (NEURONTIN) 100 MG capsule TAKE 1 CAPSULE BY MOUTH THREE TIMES A DAY.  02/25/15   Golden Circle, [name]  meclizine (ANTIVERT) 25 MG tablet Take 1 tablet (25 mg total) by mouth 3 (three) times daily as needed for dizziness. 08/28/14   Bonnielee Haff, MD  metoCLOPramide (REGLAN) 10 MG tablet Take 10 mg by mouth every 6 (six) hours as needed for nausea.    Historical Provider, MD  metoprolol (LOPRESSOR) 25 MG tablet Take 1 tablet (25 mg total) by mouth 2 (two) times daily. 08/28/14   Bonnielee Haff, MD  NONFORMULARY OR COMPOUNDED ITEM Diclofenac/Baclofen/Cyclobenzaprine/Gabapentin/Lidocaine 3/2/2/6/2.5% Cream   Apply to affected area 2-3 times per day as needed for arthritic pain. 04/16/15   Golden Circle, [name]  ondansetron (ZOFRAN) 4 MG tablet Take 1 tablet (4 mg total) by mouth every 8 (eight) hours as needed for nausea or vomiting. 02/01/15   Golden Circle, [name]  oxycodone (OXY-IR) 5 MG capsule Take 1 capsule (5 mg total) by mouth 2 (two) times daily as needed for pain. 04/16/15   Golden Circle, [name]  pantoprazole (PROTONIX) 40 MG tablet Take 1 tablet (40 mg total) by mouth daily. 02/01/15   Golden Circle, [name]  polyethylene glycol (MIRALAX / GLYCOLAX) packet Take 17 g by mouth daily as needed for moderate constipation. 08/28/14   Bonnielee Haff, MD  simvastatin (ZOCOR) 20 MG tablet Take 1 tablet (20 mg total) by mouth daily. 11/13/14   Golden Circle, [name]  temazepam (RESTORIL) 15 MG capsule Take 1 capsule (15 mg total) by mouth at bedtime. 04/16/15   Golden Circle, [name]  tizanidine (ZANAFLEX) 2 MG capsule Take 2 mg by mouth every 6 (six) hours as needed for muscle spasms.    Historical Provider, MD  traMADol (ULTRAM) 50 MG tablet Take 1 tablet (50 mg total) by mouth every 8 (eight) hours as needed for moderate pain. 04/16/15   Golden Circle, [name]  valsartan (DIOVAN) 320 MG tablet Take 0.5 tablets (160 mg total) by mouth daily. 02/22/15   Golden Circle, [name]  vitamin B-12 (CYANOCOBALAMIN) 1000 MCG tablet Take 1,000 mcg by mouth daily.    Historical Provider, MD  warfarin (COUMADIN) 5 MG tablet 5 mg on Monday Wednesday Friday and Sunday, 2.5 mg Tuesday Thursday Saturday  INR check 8/22 02/17/15   Reyne Dumas, MD   BP 108/44 mmHg  Pulse 68  Temp(Src) 99.3 F (37.4 C) (Oral)  Resp 19  SpO2 96% Physical Exam  Constitutional: She appears well-developed and well-nourished.  Laying in bed. Appears uncomfortable, but not distressed.  HENT:  Head: Normocephalic and atraumatic.  Eyes: Conjunctivae are normal. Right eye exhibits no discharge. Left eye exhibits no discharge.  Neck: Neck supple.   Cardiovascular: Normal rate, regular rhythm and normal heart sounds.  Exam reveals no gallop and no friction rub.   No murmur heard. Pulmonary/Chest: Effort normal and breath sounds normal. No respiratory distress.  Abdominal: Soft. She exhibits no distension. There is tenderness.  Diffuse abdominal tenderness, worse across the lower abdomen. No rebound or guarding. No distention.  Musculoskeletal: She exhibits no edema or tenderness.  Neurological: She is alert.  Skin: Skin is warm and dry.  Psychiatric: Her behavior is normal. Thought content normal.  Nursing note and vitals reviewed.   ED Course  Procedures (including critical care time) Labs Review Labs Reviewed  BASIC METABOLIC PANEL - Abnormal; Notable for the following:    Glucose, Bld 154 (*)    BUN 24 (*)    Creatinine, Ser 1.03 (*)    GFR calc  non Af Amer 52 (*)    All other components within normal limits  CBC - Abnormal; Notable for the following:    Hemoglobin 8.8 (*)    HCT 29.4 (*)    MCV 74.4 (*)    MCH 22.3 (*)    MCHC 29.9 (*)    RDW 15.6 (*)    All other components within normal limits  CBG MONITORING, ED - Abnormal; Notable for the following:    Glucose-Capillary 167 (*)    All other components within normal limits  URINALYSIS, ROUTINE W REFLEX MICROSCOPIC (NOT AT [facility])  I-STAT TROPOININ, ED    Imaging Review No results found. I have personally reviewed and evaluated these images and lab results as part of my medical decision-making.   EKG Interpretation   Date/Time:  Tuesday May 04 2015 18:54:17 EDT Ventricular Rate:  75 PR Interval:  152 QRS Duration: 66 QT Interval:  396 QTC Calculation: 442 R Axis:   44 Text Interpretation:  Normal sinus rhythm t wave flattening anterior  precordial leads Confirmed by Olando Willems  MD, Kaida Games ([id number]) on 05/04/2015  8:28:39 PM      MDM   Final diagnoses:  Lower abdominal pain  Intractable vomiting with nausea, vomiting of unspecified type  Anemia,  unspecified anemia type  Anticoagulated on Coumadin   74 year old female with numerous complaints. Persistent nausea and vomiting. Diffuse abdominal tenderness but worse in lower abdomen. She has a allergy to iodinated contrast agent. Will CT abdomen and pelvis without IV contrast. Urinalysis is unremarkable. CBC is significant for hemoglobin of 8.8. This is down from 11.4 on 03/09/15. No overt bleeding. Denies any melena. Fecal occult blood 1 negative. With her weakness/dizziness and this worsening anemia on Coumadin, I feel she needs to be admitted for at least observation. She continues to be pretty symptomatic in terms her nausea and vomiting as well. Mild hypotension. Afebrile. No leukocytosis. Continue IVF. EKG with some new t-wave flattening anterior precordial leads. Atypical symptoms for ACS. Troponin normal.     Virgel Manifold, MD 05/10/15 [phone]

## 2015-05-04 NOTE — ED Notes (Signed)
Pt states she feels as though she will pass out. Skin appears pale. Blood pressure to taken again.

## 2015-05-05 ENCOUNTER — Encounter (HOSPITAL_COMMUNITY): Payer: Self-pay | Admitting: Radiology

## 2015-05-05 ENCOUNTER — Emergency Department (HOSPITAL_COMMUNITY): Payer: Medicare Other

## 2015-05-05 DIAGNOSIS — K5730 Diverticulosis of large intestine without perforation or abscess without bleeding: Secondary | ICD-10-CM

## 2015-05-05 DIAGNOSIS — Z86711 Personal history of pulmonary embolism: Secondary | ICD-10-CM | POA: Diagnosis not present

## 2015-05-05 DIAGNOSIS — R110 Nausea: Secondary | ICD-10-CM | POA: Diagnosis not present

## 2015-05-05 DIAGNOSIS — K921 Melena: Secondary | ICD-10-CM | POA: Diagnosis not present

## 2015-05-05 DIAGNOSIS — D62 Acute posthemorrhagic anemia: Secondary | ICD-10-CM | POA: Diagnosis not present

## 2015-05-05 DIAGNOSIS — K449 Diaphragmatic hernia without obstruction or gangrene: Secondary | ICD-10-CM | POA: Diagnosis not present

## 2015-05-05 DIAGNOSIS — Z7901 Long term (current) use of anticoagulants: Secondary | ICD-10-CM

## 2015-05-05 DIAGNOSIS — D509 Iron deficiency anemia, unspecified: Secondary | ICD-10-CM | POA: Diagnosis not present

## 2015-05-05 DIAGNOSIS — R195 Other fecal abnormalities: Secondary | ICD-10-CM | POA: Diagnosis not present

## 2015-05-05 DIAGNOSIS — F329 Major depressive disorder, single episode, unspecified: Secondary | ICD-10-CM | POA: Diagnosis present

## 2015-05-05 DIAGNOSIS — R55 Syncope and collapse: Secondary | ICD-10-CM | POA: Diagnosis not present

## 2015-05-05 DIAGNOSIS — I350 Nonrheumatic aortic (valve) stenosis: Secondary | ICD-10-CM | POA: Diagnosis present

## 2015-05-05 DIAGNOSIS — Z5181 Encounter for therapeutic drug level monitoring: Secondary | ICD-10-CM | POA: Diagnosis not present

## 2015-05-05 DIAGNOSIS — I5032 Chronic diastolic (congestive) heart failure: Secondary | ICD-10-CM

## 2015-05-05 DIAGNOSIS — R0602 Shortness of breath: Secondary | ICD-10-CM | POA: Diagnosis not present

## 2015-05-05 DIAGNOSIS — C159 Malignant neoplasm of esophagus, unspecified: Secondary | ICD-10-CM | POA: Diagnosis not present

## 2015-05-05 DIAGNOSIS — K219 Gastro-esophageal reflux disease without esophagitis: Secondary | ICD-10-CM | POA: Diagnosis present

## 2015-05-05 DIAGNOSIS — D131 Benign neoplasm of stomach: Secondary | ICD-10-CM | POA: Diagnosis not present

## 2015-05-05 DIAGNOSIS — C160 Malignant neoplasm of cardia: Secondary | ICD-10-CM | POA: Diagnosis not present

## 2015-05-05 DIAGNOSIS — R112 Nausea with vomiting, unspecified: Secondary | ICD-10-CM | POA: Diagnosis present

## 2015-05-05 DIAGNOSIS — Z91041 Radiographic dye allergy status: Secondary | ICD-10-CM | POA: Diagnosis not present

## 2015-05-05 DIAGNOSIS — G8929 Other chronic pain: Secondary | ICD-10-CM | POA: Diagnosis present

## 2015-05-05 DIAGNOSIS — E782 Mixed hyperlipidemia: Secondary | ICD-10-CM | POA: Diagnosis present

## 2015-05-05 DIAGNOSIS — I480 Paroxysmal atrial fibrillation: Secondary | ICD-10-CM | POA: Diagnosis not present

## 2015-05-05 DIAGNOSIS — E785 Hyperlipidemia, unspecified: Secondary | ICD-10-CM | POA: Diagnosis present

## 2015-05-05 DIAGNOSIS — I10 Essential (primary) hypertension: Secondary | ICD-10-CM

## 2015-05-05 DIAGNOSIS — F419 Anxiety disorder, unspecified: Secondary | ICD-10-CM | POA: Diagnosis present

## 2015-05-05 DIAGNOSIS — I4891 Unspecified atrial fibrillation: Secondary | ICD-10-CM | POA: Diagnosis not present

## 2015-05-05 DIAGNOSIS — R109 Unspecified abdominal pain: Secondary | ICD-10-CM | POA: Diagnosis not present

## 2015-05-05 DIAGNOSIS — M1990 Unspecified osteoarthritis, unspecified site: Secondary | ICD-10-CM | POA: Diagnosis present

## 2015-05-05 DIAGNOSIS — R1030 Lower abdominal pain, unspecified: Secondary | ICD-10-CM | POA: Diagnosis not present

## 2015-05-05 DIAGNOSIS — I110 Hypertensive heart disease with heart failure: Secondary | ICD-10-CM | POA: Diagnosis present

## 2015-05-05 DIAGNOSIS — Z8673 Personal history of transient ischemic attack (TIA), and cerebral infarction without residual deficits: Secondary | ICD-10-CM | POA: Diagnosis not present

## 2015-05-05 DIAGNOSIS — K253 Acute gastric ulcer without hemorrhage or perforation: Secondary | ICD-10-CM | POA: Diagnosis not present

## 2015-05-05 DIAGNOSIS — R197 Diarrhea, unspecified: Secondary | ICD-10-CM

## 2015-05-05 DIAGNOSIS — D649 Anemia, unspecified: Secondary | ICD-10-CM | POA: Diagnosis present

## 2015-05-05 DIAGNOSIS — Z79899 Other long term (current) drug therapy: Secondary | ICD-10-CM | POA: Diagnosis not present

## 2015-05-05 DIAGNOSIS — M549 Dorsalgia, unspecified: Secondary | ICD-10-CM | POA: Diagnosis present

## 2015-05-05 LAB — COMPREHENSIVE METABOLIC PANEL
ALBUMIN: 2.9 g/dL — AB (ref 3.5–5.0)
ALK PHOS: 52 U/L (ref 38–126)
ALT: 13 U/L — AB (ref 14–54)
ANION GAP: 10 (ref 5–15)
AST: 20 U/L (ref 15–41)
BILIRUBIN TOTAL: 0.4 mg/dL (ref 0.3–1.2)
BUN: 21 mg/dL — AB (ref 6–20)
CO2: 23 mmol/L (ref 22–32)
Calcium: 8 mg/dL — ABNORMAL LOW (ref 8.9–10.3)
Chloride: 109 mmol/L (ref 101–111)
Creatinine, Ser: 0.97 mg/dL (ref 0.44–1.00)
GFR calc Af Amer: >60 mL/min (ref >60)
GFR calc non Af Amer: 56 mL/min — ABNORMAL LOW (ref >60)
GLUCOSE: 128 mg/dL — AB (ref 65–99)
POTASSIUM: 4 mmol/L (ref 3.5–5.1)
Sodium: 142 mmol/L (ref 135–145)
Total Protein: 5.2 g/dL — ABNORMAL LOW (ref 6.5–8.1)

## 2015-05-05 LAB — HEPATIC FUNCTION PANEL
ALT: 12 U/L — ABNORMAL LOW (ref 14–54)
AST: 17 U/L (ref 15–41)
Albumin: 3.1 g/dL — ABNORMAL LOW (ref 3.5–5.0)
Alkaline Phosphatase: 56 U/L (ref 38–126)
Bilirubin, Direct: <0.1 mg/dL — ABNORMAL LOW (ref 0.1–0.5)
Total Bilirubin: 0.3 mg/dL (ref 0.3–1.2)
Total Protein: 5.6 g/dL — ABNORMAL LOW (ref 6.5–8.1)

## 2015-05-05 LAB — FERRITIN: Ferritin: 7 ng/mL — ABNORMAL LOW (ref 11–307)

## 2015-05-05 LAB — IRON AND TIBC
Iron: 8 ug/dL — ABNORMAL LOW (ref 28–170)
SATURATION RATIOS: 2 % — AB (ref 10.4–31.8)
TIBC: 326 ug/dL (ref 250–450)
UIBC: 318 ug/dL

## 2015-05-05 LAB — CBC
HCT: 17.7 % — ABNORMAL LOW (ref 36.0–46.0)
HEMATOCRIT: 21.4 % — AB (ref 36.0–46.0)
HEMATOCRIT: 21.5 % — AB (ref 36.0–46.0)
HEMOGLOBIN: 6.6 g/dL — AB (ref 12.0–15.0)
HEMOGLOBIN: 6.6 g/dL — AB (ref 12.0–15.0)
Hemoglobin: 5.4 g/dL — CL (ref 12.0–15.0)
MCH: 22.4 pg — AB (ref 26.0–34.0)
MCH: 22.6 pg — AB (ref 26.0–34.0)
MCH: 22.5 pg — ABNORMAL LOW (ref 26.0–34.0)
MCHC: 30.7 g/dL (ref 30.0–36.0)
MCHC: 30.8 g/dL (ref 30.0–36.0)
MCHC: 30.5 g/dL (ref 30.0–36.0)
MCV: 72.9 fL — AB (ref 78.0–100.0)
MCV: 73.3 fL — ABNORMAL LOW (ref 78.0–100.0)
MCV: 73.8 fL — ABNORMAL LOW (ref 78.0–100.0)
Platelets: 159 10*3/uL (ref 150–400)
Platelets: 167 10*3/uL (ref 150–400)
Platelets: 169 10*3/uL (ref 150–400)
RBC: 2.92 MIL/uL — AB (ref 3.87–5.11)
RBC: 2.95 MIL/uL — ABNORMAL LOW (ref 3.87–5.11)
RBC: 2.4 MIL/uL — ABNORMAL LOW (ref 3.87–5.11)
RDW: 16 % — ABNORMAL HIGH (ref 11.5–15.5)
RDW: 16 % — ABNORMAL HIGH (ref 11.5–15.5)
RDW: 16 % — ABNORMAL HIGH (ref 11.5–15.5)
WBC: 6.1 10*3/uL (ref 4.0–10.5)
WBC: 6.1 10*3/uL (ref 4.0–10.5)
WBC: 5.2 10*3/uL (ref 4.0–10.5)

## 2015-05-05 LAB — LACTATE DEHYDROGENASE: LDH: 139 U/L (ref 98–192)

## 2015-05-05 LAB — RETICULOCYTES
RBC.: 2.92 MIL/uL — AB (ref 3.87–5.11)
RETIC CT PCT: 3.4 % — AB (ref 0.4–3.1)
Retic Count, Absolute: 99.3 10*3/uL (ref 19.0–186.0)

## 2015-05-05 LAB — APTT: aPTT: 35 seconds (ref 24–37)

## 2015-05-05 LAB — LIPASE, BLOOD: Lipase: 22 U/L (ref 11–51)

## 2015-05-05 LAB — VITAMIN B12: Vitamin B-12: 252 pg/mL (ref 180–914)

## 2015-05-05 LAB — PREPARE RBC (CROSSMATCH)

## 2015-05-05 LAB — BRAIN NATRIURETIC PEPTIDE: B Natriuretic Peptide: 103.7 pg/mL — ABNORMAL HIGH (ref 0.0–100.0)

## 2015-05-05 LAB — FOLATE: FOLATE: 15.7 ng/mL (ref >5.9)

## 2015-05-05 MED ORDER — SODIUM CHLORIDE 0.9 % IV SOLN: Freq: Once | INTRAVENOUS | Status: AC

## 2015-05-05 MED ORDER — VITAMIN B-12 1000 MCG PO TABS
1000.0000 ug | ORAL_TABLET | Freq: Every day | ORAL | Status: DC
  Administered 2015-05-05 – 2015-05-08 (×4): 1000 ug via ORAL
  Filled 2015-05-05 (×4): qty 1

## 2015-05-05 MED ORDER — FAMOTIDINE IN NACL 20-0.9 MG/50ML-% IV SOLN
20.0000 mg | Freq: Two times a day (BID) | INTRAVENOUS | Status: DC
  Administered 2015-05-05 (×2): 20 mg via INTRAVENOUS
  Filled 2015-05-05 (×3): qty 50

## 2015-05-05 MED ORDER — TIZANIDINE HCL 4 MG PO TABS: 2.0000 mg | ORAL_TABLET | Freq: Four times a day (QID) | ORAL | Status: DC | PRN

## 2015-05-05 MED ORDER — PROMETHAZINE HCL 25 MG/ML IJ SOLN
12.5000 mg | Freq: Once | INTRAMUSCULAR | Status: AC
  Administered 2015-05-05: 12.5 mg via INTRAVENOUS
  Filled 2015-05-05: qty 1

## 2015-05-05 MED ORDER — IRBESARTAN 300 MG PO TABS
150.0000 mg | ORAL_TABLET | Freq: Every day | ORAL | Status: DC
Start: 2015-05-05 — End: 2015-05-08
  Administered 2015-05-05 – 2015-05-08 (×4): 150 mg via ORAL
  Filled 2015-05-05 (×4): qty 1

## 2015-05-05 MED ORDER — BUTALBITAL-APAP-CAFFEINE 50-325-40 MG PO TABS: 1.0000 | ORAL_TABLET | ORAL | Status: DC | PRN

## 2015-05-05 MED ORDER — CLONIDINE HCL 0.1 MG PO TABS: 0.1000 mg | ORAL_TABLET | Freq: Every day | ORAL | Status: DC | PRN

## 2015-05-05 MED ORDER — SODIUM CHLORIDE 0.9 % IV SOLN
INTRAVENOUS | Status: DC
  Administered 2015-05-05: 05:00:00 via INTRAVENOUS

## 2015-05-05 MED ORDER — OXYCODONE HCL 5 MG PO TABS
5.0000 mg | ORAL_TABLET | Freq: Two times a day (BID) | ORAL | Status: DC | PRN
  Filled 2015-05-05 (×2): qty 1

## 2015-05-05 MED ORDER — MORPHINE SULFATE (PF) 2 MG/ML IV SOLN
2.0000 mg | INTRAVENOUS | Status: DC | PRN
  Administered 2015-05-05 – 2015-05-07 (×6): 2 mg via INTRAVENOUS
  Filled 2015-05-05 (×6): qty 1

## 2015-05-05 MED ORDER — METOPROLOL TARTRATE 1 MG/ML IV SOLN: 2.5000 mg | Freq: Once | INTRAVENOUS | Status: DC

## 2015-05-05 MED ORDER — OXYCODONE HCL 5 MG PO CAPS: 5.0000 mg | ORAL_CAPSULE | Freq: Two times a day (BID) | ORAL | Status: DC | PRN

## 2015-05-05 MED ORDER — MECLIZINE HCL 25 MG PO TABS: 25.0000 mg | ORAL_TABLET | Freq: Three times a day (TID) | ORAL | Status: DC | PRN

## 2015-05-05 MED ORDER — TIZANIDINE HCL 2 MG PO CAPS: 2.0000 mg | ORAL_CAPSULE | Freq: Four times a day (QID) | ORAL | Status: DC | PRN

## 2015-05-05 MED ORDER — VALSARTAN-HYDROCHLOROTHIAZIDE 320-25 MG PO TABS: 0.5000 | ORAL_TABLET | Freq: Every day | ORAL | Status: DC

## 2015-05-05 MED ORDER — ATORVASTATIN CALCIUM 20 MG PO TABS
20.0000 mg | ORAL_TABLET | Freq: Every day | ORAL | Status: DC
  Administered 2015-05-05 – 2015-05-07 (×3): 20 mg via ORAL
  Filled 2015-05-05 (×3): qty 1

## 2015-05-05 MED ORDER — SODIUM CHLORIDE 0.9 % IV SOLN: INTRAVENOUS | Status: DC

## 2015-05-05 MED ORDER — METOPROLOL TARTRATE 25 MG PO TABS
25.0000 mg | ORAL_TABLET | Freq: Two times a day (BID) | ORAL | Status: DC
  Administered 2015-05-05 – 2015-05-08 (×8): 25 mg via ORAL
  Filled 2015-05-05 (×8): qty 1

## 2015-05-05 MED ORDER — SODIUM CHLORIDE 0.9 % IV BOLUS (SEPSIS)
1000.0000 mL | Freq: Once | INTRAVENOUS | Status: AC
  Administered 2015-05-05: 1000 mL via INTRAVENOUS

## 2015-05-05 MED ORDER — SODIUM CHLORIDE 0.9 % IJ SOLN
3.0000 mL | Freq: Two times a day (BID) | INTRAMUSCULAR | Status: DC
  Administered 2015-05-05 – 2015-05-07 (×6): 3 mL via INTRAVENOUS

## 2015-05-05 MED ORDER — AMLODIPINE BESYLATE 10 MG PO TABS
10.0000 mg | ORAL_TABLET | Freq: Every day | ORAL | Status: DC
  Administered 2015-05-05 – 2015-05-08 (×4): 10 mg via ORAL
  Filled 2015-05-05 (×4): qty 1

## 2015-05-05 MED ORDER — GABAPENTIN 100 MG PO CAPS
100.0000 mg | ORAL_CAPSULE | Freq: Three times a day (TID) | ORAL | Status: DC
  Administered 2015-05-05 – 2015-05-08 (×10): 100 mg via ORAL
  Filled 2015-05-05 (×10): qty 1

## 2015-05-05 MED ORDER — PANTOPRAZOLE SODIUM 40 MG PO TBEC
40.0000 mg | DELAYED_RELEASE_TABLET | Freq: Every day | ORAL | Status: DC
Start: 2015-05-05 — End: 2015-05-06
  Administered 2015-05-05 – 2015-05-06 (×2): 40 mg via ORAL
  Filled 2015-05-05 (×2): qty 1

## 2015-05-05 MED ORDER — VITAMIN K1 10 MG/ML IJ SOLN
5.0000 mg | Freq: Once | INTRAVENOUS | Status: AC
  Administered 2015-05-05: 5 mg via INTRAVENOUS
  Filled 2015-05-05: qty 0.5

## 2015-05-05 MED ORDER — ONDANSETRON HCL 4 MG/2ML IJ SOLN
4.0000 mg | Freq: Three times a day (TID) | INTRAMUSCULAR | Status: DC | PRN
  Administered 2015-05-06: 4 mg via INTRAVENOUS
  Filled 2015-05-05 (×2): qty 2

## 2015-05-05 MED ORDER — TEMAZEPAM 15 MG PO CAPS
15.0000 mg | ORAL_CAPSULE | Freq: Every day | ORAL | Status: DC
  Administered 2015-05-05 – 2015-05-07 (×4): 15 mg via ORAL
  Filled 2015-05-05 (×4): qty 1

## 2015-05-05 MED ORDER — SODIUM CHLORIDE 0.9 % IV BOLUS (SEPSIS)
500.0000 mL | Freq: Once | INTRAVENOUS | Status: AC
  Administered 2015-05-05: 500 mL via INTRAVENOUS

## 2015-05-05 NOTE — Progress Notes (Signed)
Fall safety plan discussed with pt. And family member at bedside. Pt. And family refusing bed alarm at this time while family member is at bedside. Family member informed to notify RN/staff when he leaves pts. Room.

## 2015-05-05 NOTE — Progress Notes (Signed)
PT Cancellation Note  Patient Details Name: [name] MRN: [id number] DOB: 1941-04-19   Cancelled Treatment:    Reason Eval/Treat Not Completed: Medical issues which prohibited therapy. Pt with Hgb of 5.4.  Will hold off on PT today and check back tomorrow.   Dekker Verga LUBECK 05/05/2015, 11:26 AM

## 2015-05-05 NOTE — Progress Notes (Signed)
OT Cancellation Note  Patient Details Name: [name] MRN: [id number] DOB: 06-16-1941   Cancelled Treatment:    Reason Eval/Treat Not Completed: Medical issues which prohibited therapy (Pt currently receiving blood with hgb of 5.4. Will follow.)  [name] 05/05/2015, 12:20 PM

## 2015-05-05 NOTE — Progress Notes (Signed)
Pt. Arrived to the floor in alert and stable condition. No s/s of distress or discomfort noted. Pt. Complained of lower cramping abdominal pain. Pain medication offered. Pt. Refused. Rest and relaxation instead. Pt. Oriented to the room. Call light placed within reach. RN will monitor pt. For changes in condition. [name], Katherine Roan

## 2015-05-05 NOTE — H&P (Addendum)
[facility] History and Physical  [name] GDJ:[id number] DOB: 05/03/1941 DOA: 05/04/2015  Referring physician: ED physician PCP: Mauricio Po, [name]  Specialists:   Chief Complaint: Nausea, vomiting, diarrhea, abdominal pain, dizziness, bloody stool.  HPI: [name] is a 74 y.o. female with PMH of GI bleeding, diverticulosis, hypertension, hyperlipidemia, GERD, anxiety, diastolic congestive heart failure, atrial fibrillation on Coumadin, stroke, arthritis, chronic back pain, aortic stenosis, PE, who presents with nausea, vomiting, diarrhea, abdominal pain, dizziness, bloody stool.  Patient reports that he has been having intermittent nausea, vomiting, diarrhea, abdominal pain in the past 7 days. No recent antibiotics used. She developed dizziness, lightheadedness and generalized weakness. She had bloody stool several times in the past several days. Her abdominal pain is diffuse, moderate, nonradiating. Patient has mild cough and shortness of breath, but currently no chest pain, fever or chills. No unilateral weakness, symptoms of UTI. She reports that she had chest pain several days ago, which has resolved, currently no chest pain.  In ED, patient was found to have negative FOBT, hemoglobin dropped from 11.4 on 03/09/15 -->8.8, INR 2.85, troponin negative, negative urinalysis, temperature 99.3, no tachycardia, electrolytes okay. CT abdomen/pelvis without contrast showed large hiatal hernia and diverticulosis without diverticulitis. Patient is admitted to inpatient for further evaluation and treatment.   Where does patient live?   At home  Can patient participate in ADLs?  Yes   Review of Systems:   General: no fevers, chills, no changes in body weight, has poor appetite, has fatigue HEENT: no blurry vision, hearing changes or sore throat Pulm: has mild dyspnea, coughing, no wheezing CV: no chest pain, palpitations Abd: has nausea, vomiting, abdominal pain, diarrhea, no  constipation GU: no dysuria, burning on urination, increased urinary frequency, hematuria  Ext: no leg edema Neuro: no unilateral weakness, numbness, or tingling, no vision change or hearing loss Skin: no rash MSK: Has chronic back pain. No muscle spasm, no deformity, no limitation of range of movement in spin Heme: No easy bruising.  Travel history: No recent long distant travel.  Allergy:  Allergies  Allergen Reactions  . Iodinated Diagnostic Agents Anaphylaxis    Info given by patient  . Milk-Related Compounds     Lactose intolerance  . Darvon [Propoxyphene] Rash  . Hydralazine Anxiety    Facial flushing, pt prefers not to use it.     Past Medical History  Diagnosis Date  . CHF (congestive heart failure) ([place])   . Hypertension   . Anemia   . Broken back 2013  . Atrial fibrillation ([place])   . GERD (gastroesophageal reflux disease)   . Arthritis   . Stroke [facility])     Past Surgical History  Procedure Laterality Date  . Lumbar disc surgery  1991  . Lumbar back surgery  2012  . Cervical disc surgery  2014  . Cholecystectomy  1980s  . Appendectomy  1950s  . Tonsillectomy and adenoidectomy  1960s  . Carpal tunnel release Bilateral 1990s  . Esophagogastroduodenoscopy N/A 02/16/2015    Procedure: ESOPHAGOGASTRODUODENOSCOPY (EGD);  Surgeon: [name], MD;  Location: [facility];  Service: Endoscopy;  Laterality: N/A;  . Colonoscopy N/A 02/17/2015    Procedure: COLONOSCOPY;  Surgeon: [name], MD;  Location: [facility];  Service: Endoscopy;  Laterality: N/A;    Social History:  reports that she has never smoked. She has never used smokeless tobacco. She reports that she does not drink alcohol or use illicit drugs.  Family History:  Family History  Problem Relation Age of Onset  . Stroke Mother   . Heart disease Mother   . Emphysema Father   . Ovarian cancer Sister   . Stroke Sister   . Other Child     died at birth     Prior to Admission medications    Medication Sig Start Date End Date Taking? Authorizing Provider  amLODipine (NORVASC) 10 MG tablet Take 1 tablet (10 mg total) by mouth daily. 08/03/14  Yes Golden Circle, [name]  atorvastatin (LIPITOR) 20 MG tablet Take 20 mg by mouth daily.   Yes Historical Provider, MD  butalbital-acetaminophen-caffeine (FIORICET, ESGIC) 50-325-40 MG per tablet Take 1 tablet by mouth every 4 (four) hours as needed for headache. 08/28/14  Yes Bonnielee Haff, MD  cloNIDine (CATAPRES) 0.1 MG tablet Take 1 tablet (0.1 mg total) by mouth daily. Patient taking differently: Take 0.1 mg by mouth daily as needed (high blood pressure, will take if levels are 180/90).  08/28/14  Yes Bonnielee Haff, MD  furosemide (LASIX) 40 MG tablet TAKE 1 TO 2 TABLETS BY MOUTH ONCE DAILY. 04/09/15  Yes Golden Circle, [name]  gabapentin (NEURONTIN) 100 MG capsule TAKE 1 CAPSULE BY MOUTH THREE TIMES A DAY. 02/25/15  Yes Golden Circle, [name]  meclizine (ANTIVERT) 25 MG tablet Take 1 tablet (25 mg total) by mouth 3 (three) times daily as needed for dizziness. 08/28/14  Yes Bonnielee Haff, MD  metoCLOPramide (REGLAN) 10 MG tablet Take 10 mg by mouth every 6 (six) hours as needed for nausea.   Yes Historical Provider, MD  metoprolol (LOPRESSOR) 25 MG tablet Take 1 tablet (25 mg total) by mouth 2 (two) times daily. 08/28/14  Yes Bonnielee Haff, MD  oxycodone (OXY-IR) 5 MG capsule Take 1 capsule (5 mg total) by mouth 2 (two) times daily as needed for pain. 04/16/15  Yes Golden Circle, [name]  pantoprazole (PROTONIX) 40 MG tablet Take 1 tablet (40 mg total) by mouth daily. 02/01/15  Yes Golden Circle, [name]  simvastatin (ZOCOR) 20 MG tablet Take 1 tablet (20 mg total) by mouth daily. 11/13/14  Yes Golden Circle, [name]  temazepam (RESTORIL) 15 MG capsule Take 1 capsule (15 mg total) by mouth at bedtime. 04/16/15  Yes Golden Circle, [name]  tizanidine (ZANAFLEX) 2 MG capsule Take 2 mg by mouth every 6 (six) hours as needed for muscle spasms.   Yes  Historical Provider, MD  traMADol (ULTRAM) 50 MG tablet Take 1 tablet (50 mg total) by mouth every 8 (eight) hours as needed for moderate pain. 04/16/15  Yes Golden Circle, [name]  valsartan-hydrochlorothiazide (DIOVAN-HCT) 320-25 MG tablet Take 0.5 tablets by mouth daily.   Yes Historical Provider, MD  warfarin (COUMADIN) 5 MG tablet 5 mg on Monday Wednesday Friday and Sunday, 2.5 mg Tuesday Thursday Saturday  INR check 8/22 Patient taking differently: 5 mg on Sunday, Monday, Tuesday, Thursday, Friday,  Saturday,  2.5 mg on  Wednesday   INR check 8/22 02/17/15  Yes Reyne Dumas, MD  NONFORMULARY OR COMPOUNDED ITEM Diclofenac/Baclofen/Cyclobenzaprine/Gabapentin/Lidocaine 3/2/2/6/2.5% Cream  Apply to affected area 2-3 times per day as needed for arthritic pain. 04/16/15   Golden Circle, [name]    Physical Exam: Filed Vitals:   05/05/15 0130 05/05/15 0145 05/05/15 0200 05/05/15 0215  BP: 132/45 114/67 144/50   Pulse: 71 78 71 74  Temp:      TempSrc:      Resp: 16 16 16 14   SpO2: 97% 98% 97% 98%  General: Not in acute distress. Pale looking. HEENT:       Eyes: PERRL, EOMI, no scleral icterus.       ENT: No discharge from the ears and nose, no pharynx injection, no tonsillar enlargement.        Neck: No JVD, no bruit, no mass felt. Heme: No neck lymph node enlargement. Cardiac: S1/S2, RRR, No murmurs, No gallops or rubs. Pulm: No rales, wheezing, rhonchi or rubs. Abd: Soft, nondistended, diffused tenderness, no rebound pain, no organomegaly, BS present. Ext: No pitting leg edema bilaterally. 2+DP/PT pulse bilaterally. Musculoskeletal: No joint deformities, No joint redness or warmth, no limitation of ROM in spin. Skin: No rashes.  Neuro: Alert, oriented X3, cranial nerves II-XII grossly intact, muscle strength 5/5 in all extremities, sensation to light touch intact.  Psych: Patient is not psychotic, no suicidal or hemocidal ideation.  Labs on Admission:  Basic Metabolic  Panel:  Recent Labs Lab 05/04/15 1952  NA 139  K 4.3  CL 102  CO2 27  GLUCOSE 154*  BUN 24*  CREATININE 1.03*  CALCIUM 9.1   Liver Function Tests:  Recent Labs Lab 05/05/15 0121  AST 17  ALT 12*  ALKPHOS 56  BILITOT 0.3  PROT 5.6*  ALBUMIN 3.1*    Recent Labs Lab 05/05/15 0121  LIPASE 22   No results for input(s): AMMONIA in the last 168 hours. CBC:  Recent Labs Lab 05/04/15 1952  WBC 9.1  HGB 8.8*  HCT 29.4*  MCV 74.4*  PLT 289   Cardiac Enzymes: No results for input(s): CKTOTAL, CKMB, CKMBINDEX, TROPONINI in the last 168 hours.  BNP (last 3 results)  Recent Labs  02/12/15 2130  BNP 394.8*    ProBNP (last 3 results) No results for input(s): PROBNP in the last 8760 hours.  CBG:  Recent Labs Lab 05/04/15 2025  GLUCAP 167*    Radiological Exams on Admission: Ct Abdomen Pelvis Wo Contrast  05/05/2015  CLINICAL DATA:  Abdominal pain and lightheadedness.  Nausea. EXAM: CT ABDOMEN AND PELVIS WITHOUT CONTRAST TECHNIQUE: Multidetector CT imaging of the abdomen and pelvis was performed following the standard protocol without IV contrast. COMPARISON:  05/11/2014 FINDINGS: There is a large hiatal hernia. Stomach and small bowel are otherwise unremarkable. Prior appendectomy. Mild colonic diverticulosis. No evidence of diverticulitis or other acute inflammatory process. Unremarkable appearances of the kidneys, ureters and urinary bladder. Incidentally noted upper pole left renal cyst, unchanged. Adrenals are normal. Prior cholecystectomy. Moderate dilatation of the extrahepatic bile ducts, likely due to the cholecystectomy. Unremarkable unenhanced appearances of the liver, spleen, and pancreas. Incidentally noted benign calcified 1.7 cm lesion at the anterior periphery of the spleen, unchanged. Extensive atherosclerotic calcification of the aorta and iliac arteries. Normal caliber. Uterus and adnexal regions are unremarkable. No significant abnormality in the  lower chest except for the large hiatal hernia. No significant musculoskeletal abnormality. Incidentally noted small fat containing umbilical hernia. IMPRESSION: 1. No acute findings are evident in the abdomen or pelvis. 2. Large hiatal hernia. 3. Post cholecystectomy expansion of the extrahepatic biliary system, unchanged. 4. Diverticulosis. Electronically Signed   By: Andreas Newport M.D.   On: 05/05/2015 00:58    EKG: Independently reviewed.  QTC 442, T wave flattening  Assessment/Plan Principal Problem:   Nausea vomiting and diarrhea Active Problems:   PAF (paroxysmal atrial fibrillation) ([name])   Long term current use of anticoagulant therapy   Chronic diastolic (congestive) heart failure ([name])   Aortic stenosis   Back pain  Hypertension   Diverticulosis of large intestine without diverticulitis   Shortness of breath   Anemia   HLD (hyperlipidemia)   Abdominal pain   GERD (gastroesophageal reflux disease)   Nausea vomiting, diarrhea and AP: Etiology is not clear. CT abdomen/pelvis did not show acute abnormalities, but showed large hiatal hernia and diverticulosis. Differential diagnosis include viral or bacterial gastroenteritis, C. difficile colitis, hepatic biliary system problem and pancreatitis. -will admit to tele bed -check c diff pcr and stool culture -lipase and LFT -When necessary Zofran for nausea and a multifocal pain -IV fluid: Patient received 1 L normal saline bolus in ED, will continue gentle IVF with NS at 50 cc/h  (has dCHF, limiting aggressive IV fluid treatment).  Microcytic Anemia: Hemoglobin dropped from 11.4-->8.8. MCV 74.4.  Pt reports having bloody stool, indicating possible GI bleeding though FOBT was negative in ED. Likely having diverticulosis of bleeding in the setting of Coumadin use. -will hold coumadin and repeat FOBT -check LDH and haptoglobin to r/o hemolysis -anemia panel -cbc q6h -INR/PTT/type & screen  Addendum: pt's Hgb dropped further  11.4-->8.8-->6.6. Initially, Lab believed this was an error, but the repeat cbc still showed Hgb 6.6. LDH 139, indicating less likely to have hemolysis. -will transfuse 2 U of blood now. -continue to check cbc q6h. -will given NS bolus, 500 cc -will give one dose of Vk 5 mg x 1 to reverse the INR -I called GI in AM and spoke with Dr. Henrene Pastor. He recommended to do rectal exam again. If see blood, call GI again ( I have not done this yet, at 7:04 AM).   Atrial Fibrillation: CHA2DS2-VASc Score is 7, needs oral anticoagulation. Patient is on Coumadin. INR is 2.85 on admission. Heart rate is well controlled. Now presents with worsening anemia. -hold coumadin as above -Continue metoprolol  Chronic diastolic (congestive) heart failure ([place]): 2-D alcohol 02/03/15 showed EF 60-65%. Patient does not have leg edema. CHF is compensated on admission. -Hold Lasix -check BNP  Chronic Back pain: -prn oxycodone  Hypertension: -switch Diovan-HCTZ to irbesartan -metoprolol and amlodipine  HLD: Last LDL was 85 on 05/14/10 -Continue home medications: Lipitor -Check FLP  GERD: -will switch PPI to pepcid IV until C diff pcr negative  DVT ppx: SCD  Code Status: Full code Family Communication:    Yes, patient's son  at bed side Disposition Plan: Admit to inpatient   Date of Service 05/05/2015    Ivor Costa [facility] Pager [phone]  If 7PM-7AM, please contact night-coverage [email or web address] Password [email or web address] 05/05/2015, 2:25 AM

## 2015-05-05 NOTE — Consult Note (Signed)
[facility] Consult: 9:23 AM 05/05/2015  LOS: 0 days    Referring Provider: Dr Eliseo Squires  Primary Care Physician:  Mauricio Po, [facility] Primary Gastroenterologist:  Dr. Deatra Ina     Reason for Consultation:  abd pain, intermittent dark stools, FOBT negative. [name]     HPI: [name] is a 74 y.o. female.  Hx A fib, CHF, aortic stenosis, HTN. Hx PE twice, in 2008 and 2012.  Chronic Coumadin.  02/2015 echo with EF 60 to 65%, mild Ao stenosis, mild Mitral regurge, test insufficient to assess for diastolic dysfunction but moderately dilated left atrium.    Many years hx of microcytic anemia, transfusions of blood in 2013 at [facility] and at time of 2012 spine surgery   GI studies for transfusion requiring anemia (hgb drop from 13 to 7), melena again in 2016.  On daily PPI.  02/2015 Colonoscopy Mild descending diverticulois. Polyp at desc colon.  No fresh/old blood.  Polyp may have been bleeding source in setting of AC. Path: Benign polyp, no adenomatous change, differential includes a hamartomatous polyp as well as polypoid mucosa from an area of diverticulosis.   02/2015  EGD. To D2. Esophageal stricture.  Large HH with associated erosions.   2013 Capsule endo in [place].  Results unknown.  2011 EGD.  Dr Amedeo Plenty.  Large HH, antral erythema.  2008 EGD Dr Oletta Lamas.  HH and gastric erosions. 2005 Colonoscopy Dr Amedeo Plenty, normal study  Now with weakness, malaise for ~1 month.  1 week of anorexia, bloating when she eats, queasiness but no emesis, worse fatigue.  Stools ~ 5 x daily (not at night) are loose and dark with reddish cast, no hematochezia.  Some dysphagia to pills and solids, feels it gets stuck at region of upper esophagus.   Has led to regurge of larger pills at times. + chills and night sweats. Religiously takes her daily  Protonix. Feels less steady on feet.  Feels awful in general. + palpitations but no chest pain, even at rest.  No dizziness or syncope.   No excessive bleeding or bruising, no nose/gum bleeds.  No hematuria. No NSAIDs or ASA.  Rarely uses oxycodone and tramadol.  + insomnia, worse in last few weeks.  No cough, no new dyspnea.   Stool is FOBT negative Hgb is 5.4, down from admission level of 8.8, and 11.4 on 03/09/15.  MCV dropped to 72 from 79 in last 2 months. BUN 24, was up to 26 in 02/2015.  Ferritin 7.  coags 29/2.8.  Had dose of Coumadin decreased after INR 3.6 on 03/31/15.  CT scan without contrast is unrevealing. Shows the large [place].    Past Medical History  Diagnosis Date  . CHF (congestive heart failure) ([place])   . Hypertension   . Anemia   . Broken back 2013  . Atrial fibrillation ([place])   . GERD (gastroesophageal reflux disease)   . Arthritis   . Stroke [facility])     Past Surgical History  Procedure Laterality Date  . Lumbar disc surgery  1991  . Lumbar  back surgery  2012  . Cervical disc surgery  2014  . Cholecystectomy  1980s  . Appendectomy  1950s  . Tonsillectomy and adenoidectomy  1960s  . Carpal tunnel release Bilateral 1990s  . Esophagogastroduodenoscopy N/A 02/16/2015    Procedure: ESOPHAGOGASTRODUODENOSCOPY (EGD);  Surgeon: [name], MD;  Location: [facility];  Service: Endoscopy;  Laterality: N/A;  . Colonoscopy N/A 02/17/2015    Procedure: COLONOSCOPY;  Surgeon: [name], MD;  Location: [facility] ENDOSCOPY;  Service: Endoscopy;  Laterality: N/A;    Prior to Admission medications   Medication Sig Start Date End Date Taking? Authorizing Provider  amLODipine (NORVASC) 10 MG tablet Take 1 tablet (10 mg total) by mouth daily. 08/03/14  Yes Golden Circle, [name]  atorvastatin (LIPITOR) 20 MG tablet Take 20 mg by mouth daily.   Yes Historical Provider, MD  butalbital-acetaminophen-caffeine (FIORICET, ESGIC) 50-325-40 MG per tablet Take 1 tablet by mouth every 4 (four)  hours as needed for headache. 08/28/14  Yes Bonnielee Haff, MD  cloNIDine (CATAPRES) 0.1 MG tablet Take 1 tablet (0.1 mg total) by mouth daily. Patient taking differently: Take 0.1 mg by mouth daily as needed (high blood pressure, will take if levels are 180/90).  08/28/14  Yes Bonnielee Haff, MD  furosemide (LASIX) 40 MG tablet TAKE 1 TO 2 TABLETS BY MOUTH ONCE DAILY. 04/09/15  Yes Golden Circle, [name]  gabapentin (NEURONTIN) 100 MG capsule TAKE 1 CAPSULE BY MOUTH THREE TIMES A DAY. 02/25/15  Yes Golden Circle, [name]  meclizine (ANTIVERT) 25 MG tablet Take 1 tablet (25 mg total) by mouth 3 (three) times daily as needed for dizziness. 08/28/14  Yes Bonnielee Haff, MD  metoCLOPramide (REGLAN) 10 MG tablet Take 10 mg by mouth every 6 (six) hours as needed for nausea.   Yes Historical Provider, MD  metoprolol (LOPRESSOR) 25 MG tablet Take 1 tablet (25 mg total) by mouth 2 (two) times daily. 08/28/14  Yes Bonnielee Haff, MD  oxycodone (OXY-IR) 5 MG capsule Take 1 capsule (5 mg total) by mouth 2 (two) times daily as needed for pain. 04/16/15  Yes Golden Circle, [name]  pantoprazole (PROTONIX) 40 MG tablet Take 1 tablet (40 mg total) by mouth daily. 02/01/15  Yes Golden Circle, [name]  simvastatin (ZOCOR) 20 MG tablet Take 1 tablet (20 mg total) by mouth daily. 11/13/14  Yes Golden Circle, [name]  temazepam (RESTORIL) 15 MG capsule Take 1 capsule (15 mg total) by mouth at bedtime. 04/16/15  Yes Golden Circle, [name]  tizanidine (ZANAFLEX) 2 MG capsule Take 2 mg by mouth every 6 (six) hours as needed for muscle spasms.   Yes Historical Provider, MD  traMADol (ULTRAM) 50 MG tablet Take 1 tablet (50 mg total) by mouth every 8 (eight) hours as needed for moderate pain. 04/16/15  Yes Golden Circle, [name]  valsartan-hydrochlorothiazide (DIOVAN-HCT) 320-25 MG tablet Take 0.5 tablets by mouth daily.   Yes Historical Provider, MD  warfarin (COUMADIN) 5 MG tablet 5 mg on Monday Wednesday Friday and Sunday, 2.5 mg Tuesday  Thursday Saturday  INR check 8/22 Patient taking differently: 5 mg on Sunday, Monday, Tuesday, Thursday, Friday,  Saturday,  2.5 mg on  Wednesday   INR check 8/22 02/17/15  Yes Reyne Dumas, MD  NONFORMULARY OR COMPOUNDED ITEM Diclofenac/Baclofen/Cyclobenzaprine/Gabapentin/Lidocaine 3/2/2/6/2.5% Cream  Apply to affected area 2-3 times per day as needed for arthritic pain. 04/16/15   Golden Circle, [name]    Scheduled Meds: . amLODipine  10 mg  Oral Daily  . atorvastatin  20 mg Oral q1800  . Barium Sulfate      . famotidine (PEPCID) IV  20 mg Intravenous Q12H  . gabapentin  100 mg Oral TID  . irbesartan  150 mg Oral Daily  . metoprolol  25 mg Oral BID  . phytonadione (VITAMIN K) IV  5 mg Intravenous Once  . sodium chloride  3 mL Intravenous Q12H  . temazepam  15 mg Oral QHS   Infusions: . sodium chloride 50 mL/hr at 05/05/15 0443   PRN Meds: butalbital-acetaminophen-caffeine, cloNIDine, meclizine, morphine injection, ondansetron (ZOFRAN) IV, oxyCODONE, tiZANidine   Allergies as of 05/04/2015 - Review Complete 05/04/2015  Allergen Reaction Noted  . Iodinated diagnostic agents Anaphylaxis 10/04/2010  . Milk-related compounds  02/06/2014  . Darvon [propoxyphene] Rash 02/06/2014  . Hydralazine Anxiety 08/24/2014    Family History  Problem Relation Age of Onset  . Stroke Mother   . Heart disease Mother   . Emphysema Father   . Ovarian cancer Sister   . Stroke Sister   . Other Child     died at birth    Social History   Social History  . Marital Status: Divorced    Spouse Name: N/A  . Number of Children: 3  . Years of Education: 14   Occupational History  . Retired    Social History Main Topics  . Smoking status: Never Smoker   . Smokeless tobacco: Never Used  . Alcohol Use: No  . Drug Use: No  . Sexual Activity: Not on file   Other Topics Concern  . Not on file   Social History Narrative   Born and raised in [place], [place]. Currently resides in a house  with her son. 1 dog. Fun: go to church.   Denies religious beliefs that would effect health care.     REVIEW OF SYSTEMS: Weight down 3 # in 3 weeks.   See HPI for extensive results of 10 system review.  Unsteady gait and balance issues due to weakness, uses cane or walker.    PHYSICAL EXAM: Vital signs in last 24 hours: Filed Vitals:   05/05/15 0625  BP:   Pulse:   Temp: 98.5 F (36.9 C)  Resp: 18   Wt Readings from Last 3 Encounters:  05/05/15 133 lb 14.4 oz (60.737 kg)  04/16/15 136 lb (61.689 kg)  03/09/15 135 lb (61.236 kg)    General: pale, tired, alert, comfortable.  Looks worn out Head:  No swelling or signs of trauma  Eyes:  + conj pallor Ears:  Not HOH  Nose:  No congestion, no discharge Mouth:  Clear and moist oral MM.  Many missing teeth, overall poor dentition Neck:  No mass, no JVD Lungs:  Clear bil.  Reduced BS overall.  No cough.  Slight dyspnea.  Heart: RRR with 2/6 SM.  S1/s2 audible Abdomen:  Soft, slight right sided discomfort.  No guard or rebound.   Rectal: no mass, no stool, no blood on DRE   Musc/Skeltl: some arthritic changes in fingers.  Spine kyphotic, not severe.  Extremities:  No CCE  Neurologic:  Oriented x 3.  Slow but precise speech and verbal responses.  Skin:  No telangectasia, sores.  No hematomas.  No significant purpura.  Tattoos:  none Nodes:  No cervical adenopathy.    Psych:  Pleasant, cooperative, affect blunted c/w her feeling poorly.   Intake/Output from previous day: 11/01 0701 - 11/02 0700 In: 1000 [I.V.:1000] Out: -  Intake/Output this shift:    LAB RESULTS:  Recent Labs  05/04/15 1952 05/05/15 0330 05/05/15 0716  WBC 9.1 6.1  6.1 5.2  HGB 8.8* 6.6*  6.6* 5.4*  HCT 29.4* 21.4*  21.5* 17.7*  PLT 289 167  159 169   BMET Lab Results  Component Value Date   NA 142 05/05/2015   NA 139 05/04/2015   NA 141 02/22/2015   K 4.0 05/05/2015   K 4.3 05/04/2015   K 4.1 02/22/2015   CL 109 05/05/2015   CL 102  05/04/2015   CL 102 02/22/2015   CO2 23 05/05/2015   CO2 27 05/04/2015   CO2 34* 02/22/2015   GLUCOSE 128* 05/05/2015   GLUCOSE 154* 05/04/2015   GLUCOSE 96 02/22/2015   BUN 21* 05/05/2015   BUN 24* 05/04/2015   BUN 26* 02/22/2015   CREATININE 0.97 05/05/2015   CREATININE 1.03* 05/04/2015   CREATININE 1.19 02/22/2015   CALCIUM 8.0* 05/05/2015   CALCIUM 9.1 05/04/2015   CALCIUM 9.0 02/22/2015   LFT  Recent Labs  05/05/15 0121 05/05/15 0330  PROT 5.6* 5.2*  ALBUMIN 3.1* 2.9*  AST 17 20  ALT 12* 13*  ALKPHOS 56 52  BILITOT 0.3 0.4  BILIDIR <0.1*  --   IBILI NOT CALCULATED  --    PT/INR Lab Results  Component Value Date   INR 2.85* 05/04/2015   INR 2.9 04/14/2015   INR 3.6 03/31/2015   Hepatitis Panel No results for input(s): HEPBSAG, HCVAB, HEPAIGM, HEPBIGM in the last 72 hours. C-Diff No components found for: CDIFF Lipase     Component Value Date/Time   LIPASE 22 05/05/2015 0121    RADIOLOGY STUDIES: Ct Abdomen Pelvis Wo Contrast  05/05/2015  CLINICAL DATA:  Abdominal pain and lightheadedness.  Nausea. EXAM: CT ABDOMEN AND PELVIS WITHOUT CONTRAST TECHNIQUE: Multidetector CT imaging of the abdomen and pelvis was performed following the standard protocol without IV contrast. COMPARISON:  05/11/2014 FINDINGS: There is a large hiatal hernia. Stomach and small bowel are otherwise unremarkable. Prior appendectomy. Mild colonic diverticulosis. No evidence of diverticulitis or other acute inflammatory process. Unremarkable appearances of the kidneys, ureters and urinary bladder. Incidentally noted upper pole left renal cyst, unchanged. Adrenals are normal. Prior cholecystectomy. Moderate dilatation of the extrahepatic bile ducts, likely due to the cholecystectomy. Unremarkable unenhanced appearances of the liver, spleen, and pancreas. Incidentally noted benign calcified 1.7 cm lesion at the anterior periphery of the spleen, unchanged. Extensive atherosclerotic calcification  of the aorta and iliac arteries. Normal caliber. Uterus and adnexal regions are unremarkable. No significant abnormality in the lower chest except for the large hiatal hernia. No significant musculoskeletal abnormality. Incidentally noted small fat containing umbilical hernia. IMPRESSION: 1. No acute findings are evident in the abdomen or pelvis. 2. Large hiatal hernia. 3. Post cholecystectomy expansion of the extrahepatic biliary system, unchanged. 4. Diverticulosis. Electronically Signed   By: Andreas Newport M.D.   On: 05/05/2015 00:58    ENDOSCOPIC STUDIES Per HPI  IMPRESSION:   *  Abd bloating, nausea, malaise.  Non contrast CT scan unrevealing Had Large HH, esoph stricture, benign colon polyp and diverticulosis on CT 02/2015.   *  ABL anemia.  Microcytic, iron deficient. FOBT negative currently but reports of melenic sounding stool. Suspect she is losing blood slowly from the HH/gastric erosions.   *  Chronic AC, Coumadin, for hx PE and A fib. INR therapeutic at 2.8.      PLAN:     *  transfuse  the 2 PRBCs, CBC in AM.   *  Stop IV Pepcid, resume oral Protonix.   *  Soft diet.   *  Check TSH to rule out hypothyroid. Though most of her sxs likely due to profound anemia.    *  Per Dr Hilarie Fredrickson.  ? Repeat EGD? ? Capsule endoscopy?Azucena Freed  05/05/2015, 9:23 AM Pager: [phone]

## 2015-05-05 NOTE — Progress Notes (Signed)
Patient admitted after midnight, please see H&P. Await GI eval-- heme negative? ? Hematology consult B12 lower end of normal-- will start to replete  Miranda Bear DO

## 2015-05-05 NOTE — Progress Notes (Signed)
RN notified by CCMD that pt. Converted to afib. Pt. Alert and stable. No distress noted. Pt. Stated she has history and that it is normal for her. Text page sent to on call NP, K. Schorr. RN will continue to monitor for changes in condition. [name], [name]

## 2015-05-05 NOTE — Progress Notes (Signed)
CRITICAL VALUE ALERT  Critical value received:  Hgb 6.6  Date of notification:  05/05/15  Time of notification:  0610  Critical value read back:Yes.    Nurse who received alert:  Suzette Battiest, RN  MD notified (1st page):  Lamar Blinks, NP  Time of first page:  0615  MD notified (2nd page):  Time of second page:  Responding MD:    Time MD responded:

## 2015-05-06 ENCOUNTER — Encounter (HOSPITAL_COMMUNITY)
Admission: EM | Disposition: A | Discharge status: Home / Self Care | Payer: Self-pay | Source: Home / Self Care | Attending: Internal Medicine

## 2015-05-06 ENCOUNTER — Encounter (HOSPITAL_COMMUNITY): Payer: Self-pay | Admitting: *Deleted

## 2015-05-06 DIAGNOSIS — D649 Anemia, unspecified: Secondary | ICD-10-CM

## 2015-05-06 DIAGNOSIS — Z5181 Encounter for therapeutic drug level monitoring: Secondary | ICD-10-CM

## 2015-05-06 DIAGNOSIS — R195 Other fecal abnormalities: Secondary | ICD-10-CM | POA: Insufficient documentation

## 2015-05-06 DIAGNOSIS — R0602 Shortness of breath: Secondary | ICD-10-CM

## 2015-05-06 DIAGNOSIS — Z7901 Long term (current) use of anticoagulants: Secondary | ICD-10-CM

## 2015-05-06 DIAGNOSIS — I480 Paroxysmal atrial fibrillation: Secondary | ICD-10-CM

## 2015-05-06 DIAGNOSIS — C159 Malignant neoplasm of esophagus, unspecified: Secondary | ICD-10-CM

## 2015-05-06 DIAGNOSIS — D509 Iron deficiency anemia, unspecified: Secondary | ICD-10-CM | POA: Insufficient documentation

## 2015-05-06 HISTORY — PX: ESOPHAGOGASTRODUODENOSCOPY: SHX5428

## 2015-05-06 HISTORY — PX: GIVENS CAPSULE STUDY: SHX5432

## 2015-05-06 HISTORY — DX: Malignant neoplasm of esophagus, unspecified: C15.9

## 2015-05-06 LAB — CBC
HEMATOCRIT: 27.7 % — AB (ref 36.0–46.0)
HEMOGLOBIN: 8.7 g/dL — AB (ref 12.0–15.0)
MCH: 24.4 pg — ABNORMAL LOW (ref 26.0–34.0)
MCHC: 31.4 g/dL (ref 30.0–36.0)
MCV: 77.8 fL — AB (ref 78.0–100.0)
Platelets: 143 10*3/uL — ABNORMAL LOW (ref 150–400)
RBC: 3.56 MIL/uL — AB (ref 3.87–5.11)
RDW: 16.1 % — AB (ref 11.5–15.5)
WBC: 4.7 10*3/uL (ref 4.0–10.5)

## 2015-05-06 LAB — HAPTOGLOBIN: HAPTOGLOBIN: 86 mg/dL (ref 34–200)

## 2015-05-06 LAB — TROPONIN I

## 2015-05-06 LAB — GLUCOSE, CAPILLARY: GLUCOSE-CAPILLARY: 118 mg/dL — AB (ref 65–99)

## 2015-05-06 LAB — TSH: TSH: 1.823 u[IU]/mL (ref 0.350–4.500)

## 2015-05-06 SURGERY — EGD (ESOPHAGOGASTRODUODENOSCOPY)
Anesthesia: Moderate Sedation

## 2015-05-06 MED ORDER — PANTOPRAZOLE SODIUM 40 MG PO TBEC
40.0000 mg | DELAYED_RELEASE_TABLET | Freq: Two times a day (BID) | ORAL | Status: DC
  Administered 2015-05-06 – 2015-05-08 (×4): 40 mg via ORAL
  Filled 2015-05-06 (×4): qty 1

## 2015-05-06 MED ORDER — FENTANYL CITRATE (PF) 100 MCG/2ML IJ SOLN
INTRAMUSCULAR | Status: AC
  Filled 2015-05-06: qty 2

## 2015-05-06 MED ORDER — MIDAZOLAM HCL 5 MG/ML IJ SOLN
INTRAMUSCULAR | Status: AC
  Filled 2015-05-06: qty 2

## 2015-05-06 MED ORDER — SODIUM CHLORIDE 0.9 % IV SOLN
510.0000 mg | Freq: Once | INTRAVENOUS | Status: DC
  Filled 2015-05-06: qty 17

## 2015-05-06 MED ORDER — MIDAZOLAM HCL 10 MG/2ML IJ SOLN
INTRAMUSCULAR | Status: DC | PRN
  Administered 2015-05-06: 2 mg via INTRAVENOUS
  Administered 2015-05-06: 1 mg via INTRAVENOUS

## 2015-05-06 MED ORDER — FENTANYL CITRATE (PF) 100 MCG/2ML IJ SOLN
INTRAMUSCULAR | Status: DC | PRN
  Administered 2015-05-06: 25 ug via INTRAVENOUS

## 2015-05-06 MED ORDER — DIPHENHYDRAMINE HCL 50 MG/ML IJ SOLN
INTRAMUSCULAR | Status: AC
  Filled 2015-05-06: qty 1

## 2015-05-06 MED ORDER — BUTAMBEN-TETRACAINE-BENZOCAINE 2-2-14 % EX AERO
INHALATION_SPRAY | CUTANEOUS | Status: DC | PRN
  Administered 2015-05-06: 1 via TOPICAL

## 2015-05-06 MED ORDER — SODIUM CHLORIDE 0.9 % IV SOLN
510.0000 mg | Freq: Once | INTRAVENOUS | Status: AC
  Administered 2015-05-06: 510 mg via INTRAVENOUS
  Filled 2015-05-06 (×2): qty 17

## 2015-05-06 SURGICAL SUPPLY — 1 items: TOWEL COTTON PACK 4EA (MISCELLANEOUS) ×4 IMPLANT

## 2015-05-06 NOTE — Progress Notes (Signed)
Utilization review completed. Daysean Tinkham, RN, BSN. 

## 2015-05-06 NOTE — Evaluation (Signed)
Occupational Therapy Evaluation Patient Details Name: [name] MRN: [id number] DOB: 1940-12-07 Today's Date: 05/06/2015    History of Present Illness 74 y.o. female with PMH of GI bleeding, diverticulosis, hypertension, hyperlipidemia, GERD, anxiety, diastolic congestive heart failure, atrial fibrillation on Coumadin, stroke, arthritis, chronic back pain, aortic stenosis, PE, who presents with nausea, vomiting, diarrhea, abdominal pain, dizziness, and bloody stool.   Clinical Impression   Pt was independent prior to admission. Presents with generalized weakness and impaired balance interfering with ability to perform ADL and mobility at her baseline.  Will follow acutely.  Do not anticipate pt will need follow up OT as her medical status improves.    Follow Up Recommendations  No OT follow up    Equipment Recommendations       Recommendations for Other Services       Precautions / Restrictions Precautions Precautions: Fall      Mobility Bed Mobility    General bed mobility comments: in chair  Transfers Overall transfer level: Needs assistance Equipment used: None Transfers: Sit to/from Stand Sit to Stand: Supervision         General transfer comment: no physical assist, supervision for safety    Balance                                            ADL Overall ADL's : Needs assistance/impaired Eating/Feeding: Independent;Sitting   Grooming: Wash/dry hands;Wash/dry face;Set up;Sitting   Upper Body Bathing: Set up;Cueing for UE precautions   Lower Body Bathing: Supervison/ safety;Sit to/from stand   Upper Body Dressing : Set up;Sitting   Lower Body Dressing: Sit to/from stand;Supervision/safety   Toilet Transfer: Supervision/safety;Ambulation   Toileting- Clothing Manipulation and Hygiene: Modified independent;Sit to/from stand       Functional mobility during ADLs: Supervision/safety (assist for IV pole) General ADL Comments:  Pt able to retrieve items for ADL with supervision from closet.     Vision     Perception     Praxis      Pertinent Vitals/Pain Pain Assessment: Faces Faces Pain Scale: Hurts a little bit Pain Location: back Pain Descriptors / Indicators: Aching Pain Intervention(s): Monitored during session;Repositioned     Hand Dominance Right   Extremity/Trunk Assessment Upper Extremity Assessment Upper Extremity Assessment: Overall WFL for tasks assessed   Lower Extremity Assessment Lower Extremity Assessment: Defer to PT evaluation       Communication Communication Communication: No difficulties   Cognition Arousal/Alertness: Awake/alert Behavior During Therapy: WFL for tasks assessed/performed Overall Cognitive Status: Within Functional Limits for tasks assessed                     General Comments       Exercises       Shoulder Instructions      Home Living Family/patient expects to be discharged to:: Private residence Living Arrangements: Children Available Help at Discharge: Family;Available 24 hours/day Type of Home: House Home Access: Stairs to enter [employer] of Steps: 3   Home Layout: One level     Bathroom Shower/Tub: Occupational psychologist: Standard     Home Equipment: Environmental consultant - 2 wheels;Cane - single point;Shower seat - built in          Prior Functioning/Environment Level of Independence: Independent;Independent with assistive device(s)        Comments: used AD as needed  OT Diagnosis: Generalized weakness (chronic pain)   OT Problem List: Decreased strength;Decreased activity tolerance;Impaired balance (sitting and/or standing);Decreased knowledge of use of DME or AE   OT Treatment/Interventions: Self-care/ADL training;Patient/family education;DME and/or AE instruction;Energy conservation    OT Goals(Current goals can be found in the care plan section) Acute Rehab OT Goals Patient Stated Goal: feel  better OT Goal Formulation: With patient Time For Goal Achievement: 05/13/15 Potential to Achieve Goals: Good ADL Goals Pt Will Transfer to Toilet: with modified independence;ambulating Pt Will Perform Tub/Shower Transfer: Shower transfer;with modified independence;ambulating;shower seat Additional ADL Goal #1: Pt will state at least 3 energy conservation strategies including benefits of seated showering.  OT Frequency: Min 2X/week   Barriers to D/C:            Co-evaluation              End of Session Nurse Communication:  (son with question about IV iron)  Activity Tolerance: Patient tolerated treatment well Patient left: in chair;with call bell/phone within reach;with family/visitor present   Time: 1255-1330 OT Time Calculation (min): 35 min Charges:  OT General Charges $OT Visit: 1 Procedure OT Evaluation $Initial OT Evaluation Tier I: 1 Procedure OT Treatments $Self Care/Home Management : 8-22 mins G-Codes:    [name] 05/06/2015, 2:48 PM  [phone]

## 2015-05-06 NOTE — Evaluation (Signed)
Physical Therapy Evaluation Patient Details Name: [name] MRN: [id number] DOB: 1941/05/21 Today's Date: 05/06/2015   History of Present Illness  74 y.o. female with PMH of GI bleeding, diverticulosis, hypertension, hyperlipidemia, GERD, anxiety, diastolic congestive heart failure, atrial fibrillation on Coumadin, stroke, arthritis, chronic back pain, aortic stenosis, PE, who presents with nausea, vomiting, diarrhea, abdominal pain, dizziness, and bloody stool.  Clinical Impression  Pt admitted with above diagnosis. Pt currently with functional limitations due to the deficits listed below (see PT Problem List). Mobility limited on eval by lethargy. Pt reports receiving morphine shot this AM. She is also NPO awaiting endoscopy. On eval, pt required supervision for bed mobility and min guard assist transfers and gait 25 feet hand-held assist. Pt will benefit from skilled PT to increase their independence and safety with mobility to allow discharge to the venue listed below.       Follow Up Recommendations Home health PT;Supervision/Assistance - 24 hour    Equipment Recommendations  None recommended by PT    Recommendations for Other Services       Precautions / Restrictions Precautions Precautions: Fall      Mobility  Bed Mobility Overal bed mobility: Needs Assistance Bed Mobility: Supine to Sit;Sit to Supine     Supine to sit: Supervision Sit to supine: Supervision   General bed mobility comments: supervision for safety only.  Transfers Overall transfer level: Needs assistance Equipment used: None Transfers: Sit to/from Stand Sit to Stand: Min guard         General transfer comment: verbal cues for safety and sequencing  Ambulation/Gait Ambulation/Gait assistance: Min guard, hand held assist Ambulation Distance (Feet): 25 Feet Assistive device: None (pushing IV pole) Gait Pattern/deviations: Step-through pattern;Decreased stride length Gait velocity:  decreased Gait velocity interpretation: Below normal speed for age/gender General Gait Details: No LOB. slow, steady gait  Stairs            Wheelchair Mobility    Modified Rankin (Stroke Patients Only)       Balance                                             Pertinent Vitals/Pain Pain Assessment: Faces Faces Pain Scale: Hurts a little bit Pain Location: "I just hurt all over." Pain Intervention(s): Premedicated before session (Pt reports receiving morphine prior to PT arrival.)    Home Living Family/patient expects to be discharged to:: Private residence Living Arrangements: Children Available Help at Discharge: Family;Available 24 hours/day Type of Home: House Home Access: Stairs to enter   [employer] of Steps: 3 Home Layout: One level Home Equipment: Walker - 2 wheels;Cane - single point;Shower seat - built in      Prior Function Level of Independence: Independent;Independent with assistive device(s)         Comments: used AD as needed     Hand Dominance        Extremity/Trunk Assessment   Upper Extremity Assessment: Defer to OT evaluation           Lower Extremity Assessment: Generalized weakness         Communication   Communication: No difficulties  Cognition Arousal/Alertness: Lethargic;Suspect due to medications Behavior During Therapy: Flat affect Overall Cognitive Status: Within Functional Limits for tasks assessed  General Comments      Exercises        Assessment/Plan    PT Assessment Patient needs continued PT services  PT Diagnosis Difficulty walking;Generalized weakness   PT Problem List Decreased strength;Decreased activity tolerance;Decreased balance;Decreased mobility;Pain  PT Treatment Interventions DME instruction;Gait training;Stair training;Functional mobility training;Therapeutic activities;Therapeutic exercise;Patient/family education;Balance  training   PT Goals (Current goals can be found in the Care Plan section) Acute Rehab PT Goals Patient Stated Goal: feel better PT Goal Formulation: With patient Time For Goal Achievement: 05/20/15 Potential to Achieve Goals: Good    Frequency Min 3X/week   Barriers to discharge        Co-evaluation               End of Session   Activity Tolerance: Patient limited by fatigue;Treatment limited secondary to medical complications (Comment) (NPO awaiting endoscopy at 1345.) Patient left: in bed;with family/visitor present;with call bell/phone within reach;with bed alarm set Nurse Communication: Mobility status         Time: 7096-2836 PT Time Calculation (min) (ACUTE ONLY): 13 min   Charges:   PT Evaluation $Initial PT Evaluation Tier I: 1 Procedure     PT G CodesLorriane Shire 05/06/2015, 11:29 AM

## 2015-05-06 NOTE — Progress Notes (Signed)
PROGRESS NOTE  [name] LNL:[id number] DOB: April 30, 1941 DOA: 05/04/2015 PCP: Mauricio Po, [name]  Assessment/Plan: Nausea vomiting, diarrhea and AP:  - CT abdomen/pelvis did not show acute abnormalities, but showed large hiatal hernia and diverticulosis. Differential diagnosis include viral or bacterial gastroenteritis, C. difficile colitis, hepatic biliary system problem and pancreatitis. -lipase and LFT ok  Microcytic Anemia: Hemoglobin dropped from 11.4-->8.8. MCV 74.4.  -anemia panel- FE def--- IV Fe - GI  Atrial Fibrillation: CHA2DS2-VASc Score is 7, needs oral anticoagulation. Patient is on Coumadin. INR is 2.85 on admission. Heart rate is well controlled. Now presents with worsening anemia. -hold coumadin as above -Continue metoprolol  Chronic diastolic (congestive) heart failure ([place]): 2-D alcohol 02/03/15 showed EF 60-65%. Patient does not have leg edema. CHF is compensated on admission. -Hold Lasix -check BNP  Chronic Back pain: -prn oxycodone  Hypertension: -switch Diovan-HCTZ to irbesartan -metoprolol and amlodipine  HLD: Last LDL was 85 on 05/14/10 -Continue home medications: Lipitor   Code Status: full Family Communication: patient Disposition Plan:    Consultants:    Procedures:      HPI/Subjective: No complaints this AM--- later nursing called to report chest discomfort  Objective: Filed Vitals:   05/06/15 1103  BP: 125/56  Pulse: 53  Temp: 98.7 F (37.1 C)  Resp: 20    Intake/Output Summary (Last 24 hours) at 05/06/15 1309 Last data filed at 05/06/15 0848  Gross per 24 hour  Intake 1575.84 ml  Output   1450 ml  Net 125.84 ml   Filed Weights   05/05/15 0305 05/06/15 0417  Weight: 60.737 kg (133 lb 14.4 oz) 62.279 kg (137 lb 4.8 oz)    Exam:   General:  A+Ox3, NAD  Cardiovascular: irr  Respiratory: clear  Abdomen: +BS, soft  Musculoskeletal: no edema   Data Reviewed: Basic Metabolic Panel:  Recent Labs Lab  05/04/15 1952 05/05/15 0330  NA 139 142  K 4.3 4.0  CL 102 109  CO2 27 23  GLUCOSE 154* 128*  BUN 24* 21*  CREATININE 1.03* 0.97  CALCIUM 9.1 8.0*   Liver Function Tests:  Recent Labs Lab 05/05/15 0121 05/05/15 0330  AST 17 20  ALT 12* 13*  ALKPHOS 56 52  BILITOT 0.3 0.4  PROT 5.6* 5.2*  ALBUMIN 3.1* 2.9*    Recent Labs Lab 05/05/15 0121  LIPASE 22   No results for input(s): AMMONIA in the last 168 hours. CBC:  Recent Labs Lab 05/04/15 1952 05/05/15 0330 05/05/15 0716 05/06/15 0300  WBC 9.1 6.1  6.1 5.2 4.7  HGB 8.8* 6.6*  6.6* 5.4* 8.7*  HCT 29.4* 21.4*  21.5* 17.7* 27.7*  MCV 74.4* 73.3*  72.9* 73.8* 77.8*  PLT 289 167  159 169 143*   Cardiac Enzymes:  Recent Labs Lab 05/06/15 1135  TROPONINI <0.03   BNP (last 3 results)  Recent Labs  02/12/15 2130 05/05/15 0303  BNP 394.8* 103.7*    ProBNP (last 3 results) No results for input(s): PROBNP in the last 8760 hours.  CBG:  Recent Labs Lab 05/04/15 2025 05/06/15 0649  GLUCAP 167* 118*    No results found for this or any previous visit (from the past 240 hour(s)).   Studies: Ct Abdomen Pelvis Wo Contrast  05/05/2015  CLINICAL DATA:  Abdominal pain and lightheadedness.  Nausea. EXAM: CT ABDOMEN AND PELVIS WITHOUT CONTRAST TECHNIQUE: Multidetector CT imaging of the abdomen and pelvis was performed following the standard protocol without IV contrast. COMPARISON:  05/11/2014 FINDINGS: There is a  large hiatal hernia. Stomach and small bowel are otherwise unremarkable. Prior appendectomy. Mild colonic diverticulosis. No evidence of diverticulitis or other acute inflammatory process. Unremarkable appearances of the kidneys, ureters and urinary bladder. Incidentally noted upper pole left renal cyst, unchanged. Adrenals are normal. Prior cholecystectomy. Moderate dilatation of the extrahepatic bile ducts, likely due to the cholecystectomy. Unremarkable unenhanced appearances of the liver, spleen,  and pancreas. Incidentally noted benign calcified 1.7 cm lesion at the anterior periphery of the spleen, unchanged. Extensive atherosclerotic calcification of the aorta and iliac arteries. Normal caliber. Uterus and adnexal regions are unremarkable. No significant abnormality in the lower chest except for the large hiatal hernia. No significant musculoskeletal abnormality. Incidentally noted small fat containing umbilical hernia. IMPRESSION: 1. No acute findings are evident in the abdomen or pelvis. 2. Large hiatal hernia. 3. Post cholecystectomy expansion of the extrahepatic biliary system, unchanged. 4. Diverticulosis. Electronically Signed   By: Andreas Newport M.D.   On: 05/05/2015 00:58    Scheduled Meds: . amLODipine  10 mg Oral Daily  . atorvastatin  20 mg Oral q1800  . ferumoxytol  510 mg Intravenous Once  . gabapentin  100 mg Oral TID  . irbesartan  150 mg Oral Daily  . metoprolol  2.5 mg Intravenous Once  . metoprolol  25 mg Oral BID  . pantoprazole  40 mg Oral BID  . sodium chloride  3 mL Intravenous Q12H  . temazepam  15 mg Oral QHS  . vitamin B-12  1,000 mcg Oral Daily   Continuous Infusions:  Antibiotics Given (last 72 hours)    None      Principal Problem:   Nausea vomiting and diarrhea Active Problems:   PAF (paroxysmal atrial fibrillation) ([name])   Long term current use of anticoagulant therapy   Chronic diastolic (congestive) heart failure ([name])   Aortic stenosis   Back pain   Hypertension   Diverticulosis of large intestine without diverticulitis   Shortness of breath   Anemia   HLD (hyperlipidemia)   Abdominal pain   GERD (gastroesophageal reflux disease)    Time spent: 25 min    Shatisha Falter  [facility] Pager [phone]. If 7PM-7AM, please contact night-coverage at [email or web address], password [facility] 05/06/2015, 1:09 PM  LOS: 1 day

## 2015-05-06 NOTE — Op Note (Signed)
[facility] [place], [address]   ENDOSCOPY PROCEDURE REPORT  PATIENT: [name], [name]  MR#: [id number] BIRTHDATE: 1941-02-19 , 74  yrs. old GENDER: female ENDOSCOPIST: [name], MD REFERRED BY:  [facility] PROCEDURE DATE:  05/06/2015 PROCEDURE:  EGD, diagnostic and EGD w/ biopsy ASA CLASS:     Class III INDICATIONS:  iron deficiency anemia and intermittent melena with recurrent anemia. MEDICATIONS: Fentanyl 25 mcg IV and Versed 3 mg IV TOPICAL ANESTHETIC: Cetacaine Spray  DESCRIPTION OF PROCEDURE: After the risks benefits and alternatives of the procedure were thoroughly explained, informed consent was obtained.  The Pentax Gastroscope [id number] endoscope was introduced through the mouth and advanced to the second portion of the duodenum , Without limitations.  The instrument was slowly withdrawn as the mucosa was fully examined.       ESOPHAGUS: Tortuous esophagus with normal mucosa.   Top of the gastric folds at 30 cm.  STOMACH: A sessile polypoid lesion measuring 1.2 cm in size was found in the cardia on the stomach side of the GEJ.  This was not bleeding.  Multiple biopsies was performed using cold forceps.   A large, 10 cm, hiatal hernia was noted.  Multiple non-bleeding Cameron's erosions were found in the gastric body near the diaphragmatic hiatus. These erosions were linear along the gastric folds.  The distal stomach appeared unremarkable.  DUODENUM: The duodenal mucosa showed no abnormalities in the bulb and 2nd part of the duodenum.  Retroflexed views revealed as previously described.     The scope was then withdrawn from the patient and the procedure completed.  COMPLICATIONS: There were no immediate complications.  ENDOSCOPIC IMPRESSION: 1.   Tortuous esophagus with normal mucosa 2.   Sessile polypoid lesion in the gastric cardia just distal and approaching the gastroesophageal junction; multiple  biopsies was performed 3.   Large, 10 cm, hiatal hernia 4.   Cameron's erosion were found not currently bleeding 5.   The duodenal mucosa showed no abnormalities in the bulb and 2nd part of the duodenum  RECOMMENDATIONS: 1.  Await biopsy results 2.  Twice daily PPI 3.  Hold on video capsule endoscopy at this time pending biopsy results.  My suspicion is that she is having intermittent bleeding from Cameron's lesions caused by her large hiatal hernia in the setting of anticoagulation  eSigned:  [name], MD 05/06/2015 4:03 PM    CC: the patient  PATIENT NAME:  [name], [name] MR#: [id number]

## 2015-05-06 NOTE — Progress Notes (Signed)
MEDICATION RELATED CONSULT NOTE - INITIAL   Pharmacy Consult:  Feraheme Indication:  Anemia  Allergies  Allergen Reactions  . Iodinated Diagnostic Agents Anaphylaxis    Info given by patient  . Milk-Related Compounds     Lactose intolerance  . Darvon [Propoxyphene] Rash  . Hydralazine Anxiety    Facial flushing, pt prefers not to use it.     Patient Measurements: Height: 5\' 2"  (157.5 cm) Weight: 137 lb 4.8 oz (62.279 kg) IBW/kg (Calculated) : 50.1  Vital Signs: Temp: 98.6 F (37 C) (11/03 1414) Temp Source: Oral (11/03 1414) BP: 122/49 mmHg (11/03 1600) Pulse Rate: 121 (11/03 1600) Intake/Output from previous day: 11/02 0701 - 11/03 0700 In: 2225 [P.O.:480; I.V.:1025; Blood:670; IV Piggyback:50] Out: 1100 [Urine:1100] Intake/Output from this shift: Total I/O In: -  Out: 800 [Urine:800]  Labs:  Recent Labs  05/04/15 1952 05/05/15 0121 05/05/15 0330 05/05/15 0716 05/06/15 0300  WBC 9.1  --  6.1  6.1 5.2 4.7  HGB 8.8*  --  6.6*  6.6* 5.4* 8.7*  HCT 29.4*  --  21.4*  21.5* 17.7* 27.7*  PLT 289  --  167  159 169 143*  APTT  --   --  35  --   --   CREATININE 1.03*  --  0.97  --   --   ALBUMIN  --  3.1* 2.9*  --   --   PROT  --  5.6* 5.2*  --   --   AST  --  17 20  --   --   ALT  --  12* 13*  --   --   ALKPHOS  --  56 52  --   --   BILITOT  --  0.3 0.4  --   --   BILIDIR  --  <0.1*  --   --   --   IBILI  --  NOT CALCULATED  --   --   --    Estimated Creatinine Clearance: 43.5 mL/min (by C-G formula based on Cr of 0.97).   Microbiology: No results found for this or any previous visit (from the past 720 hour(s)).  Medical History: Past Medical History  Diagnosis Date  . CHF (congestive heart failure) ([place])   . Hypertension   . Broken back 2013    chronic back pain.   . Atrial fibrillation ([place])   . GERD (gastroesophageal reflux disease)   . Arthritis   . Stroke ([place])   . Pulmonary emboli ([place]) 2008, 2012  . Anemia 2005    generally microcytic,  transfusions in [address], 2012, 02/2015, 05/2015  . HH (hiatus hernia) 2008    large with associated erosions.       Assessment: [name] with history of GIB on Coumadin PTA for AFib.  Patient presented with intermittent dark stools and iron deficiency anemia.  Now s/p endoscopy and Gastro suspects bleeding from Cameron's lesions.  Pharmacy consulted to dose IV iron.  Her iron is low at 8 and she is deficient approximately 1350mg  of iron.  Patient has already received Feraheme 510mg  IV x 1 today.   Goal of Therapy:  Hgb >/= 13 g/dL   Plan:  - Feraheme 510mg  IV x 1 on 05/10/15 - Consider starting PO supplementation post IV repletion.  Should patient be discharged prior to 05/10/15, may reschedule dose on 05/09/15 (3 days from last dose). - Pharmacy will sign off; please reconsult if needed   Daden Mahany D. Mina Marble, PharmD, [place] Pager:  [phone] 05/06/2015, 4:52 PM

## 2015-05-07 ENCOUNTER — Inpatient Hospital Stay (HOSPITAL_COMMUNITY): Payer: Medicare Other

## 2015-05-07 ENCOUNTER — Encounter (HOSPITAL_COMMUNITY): Payer: Self-pay | Admitting: Internal Medicine

## 2015-05-07 DIAGNOSIS — K253 Acute gastric ulcer without hemorrhage or perforation: Secondary | ICD-10-CM | POA: Insufficient documentation

## 2015-05-07 DIAGNOSIS — I4891 Unspecified atrial fibrillation: Secondary | ICD-10-CM

## 2015-05-07 DIAGNOSIS — R55 Syncope and collapse: Secondary | ICD-10-CM

## 2015-05-07 DIAGNOSIS — R112 Nausea with vomiting, unspecified: Secondary | ICD-10-CM

## 2015-05-07 DIAGNOSIS — R110 Nausea: Secondary | ICD-10-CM

## 2015-05-07 DIAGNOSIS — C160 Malignant neoplasm of cardia: Secondary | ICD-10-CM

## 2015-05-07 DIAGNOSIS — R197 Diarrhea, unspecified: Secondary | ICD-10-CM

## 2015-05-07 DIAGNOSIS — C159 Malignant neoplasm of esophagus, unspecified: Secondary | ICD-10-CM

## 2015-05-07 DIAGNOSIS — I350 Nonrheumatic aortic (valve) stenosis: Secondary | ICD-10-CM

## 2015-05-07 LAB — GLUCOSE, CAPILLARY
GLUCOSE-CAPILLARY: 123 mg/dL — AB (ref 65–99)
GLUCOSE-CAPILLARY: 208 mg/dL — AB (ref 65–99)

## 2015-05-07 LAB — CBC
HCT: 27.4 % — ABNORMAL LOW (ref 36.0–46.0)
Hemoglobin: 8.5 g/dL — ABNORMAL LOW (ref 12.0–15.0)
MCH: 24.5 pg — ABNORMAL LOW (ref 26.0–34.0)
MCHC: 31 g/dL (ref 30.0–36.0)
MCV: 79 fL (ref 78.0–100.0)
PLATELETS: 139 10*3/uL — AB (ref 150–400)
RBC: 3.47 MIL/uL — AB (ref 3.87–5.11)
RDW: 16.5 % — AB (ref 11.5–15.5)
WBC: 4.8 10*3/uL (ref 4.0–10.5)

## 2015-05-07 LAB — TROPONIN I: Troponin I: <0.03 ng/mL (ref <0.031)

## 2015-05-07 MED ORDER — WARFARIN SODIUM 5 MG PO TABS: 5.0000 mg | ORAL_TABLET | Freq: Once | ORAL | Status: DC

## 2015-05-07 MED ORDER — TRAMADOL HCL 50 MG PO TABS: 50.0000 mg | ORAL_TABLET | Freq: Four times a day (QID) | ORAL | Status: DC | PRN

## 2015-05-07 MED ORDER — DIPHENHYDRAMINE HCL 25 MG PO CAPS: 25.0000 mg | ORAL_CAPSULE | Freq: Four times a day (QID) | ORAL | Status: DC | PRN

## 2015-05-07 NOTE — Care Management Note (Signed)
Case Management Note  Patient Details  Name: [name] MRN: [id number] Date of Birth: June 30, 1941  Subjective/Objective:       Admitted with Nausea/ vomiting/ diarrhea             Action/Plan: Lives at home with son [name], has a walker; private insurance with Medicare and [facility] for prescription drug coverage - pharmacy of choice is [facility] - no problem getting her medication. Patient could benefit from [facility]; [place] choice offered patient chose [facility]; Butch Penny with [facility] made aware of referral for HHPT; Attending MD please enter the face to face document in Epic per Medicare requirements for [facility] services;   Expected Discharge Date:   possibly 05/07/2015               Expected Discharge Plan:  [place]  In-House Referral:  NA Choice offered to:  Patient  HH Arranged:  PT [place] Agency:  [place]  Status of Service:  In process, will continue to follow  Sherrilyn Rist [phone] 05/07/2015, 10:01 AM

## 2015-05-07 NOTE — Progress Notes (Addendum)
PROGRESS NOTE  [name] MVH:[id number] DOB: 1941/04/29 DOA: 05/04/2015 PCP: Mauricio Po, [name]  Assessment/Plan: Nausea vomiting, diarrhea and AP:  - CT abdomen/pelvis did not show acute abnormalities, but showed large hiatal hernia and diverticulosis. Differential diagnosis include viral or bacterial gastroenteritis, C. difficile colitis, hepatic biliary system problem and pancreatitis. -lipase and LFT ok  Microcytic Anemia: Hemoglobin dropped from 11.4-->8.8. MCV 74.4.  -anemia panel- FE def--- IV Fe - GI: large HH with non-bleeding Cameron erosions. Suspect intermittent bleeding from the erosions.  -pathology per GI back as adenocarcinoma-- needs CT Scan and EUS  Atrial Fibrillation: CHA2DS2-VASc Score is 7, needs oral anticoagulation. Patient is on Coumadin. INR is 2.85 on admission. Heart rate is well controlled. Now presents with worsening anemia. -hold coumadin as above -Continue metoprolol  Chronic diastolic (congestive) heart failure ([place]): 2-D alcohol 02/03/15 showed EF 60-65%. Patient does not have leg edema. CHF is compensated on admission.   Chronic Back pain: -tramadol  Hypertension: -switch Diovan-HCTZ to irbesartan -metoprolol and amlodipine  HLD: Last LDL was 85 on 05/14/10 -Continue home medications: Lipitor   Code Status: full Family Communication: patient Disposition Plan:    Consultants:  GI  Procedures:      HPI/Subjective: Tearful this AM  Objective: Filed Vitals:   05/07/15 0945  BP: 162/48  Pulse: 72  Temp: 98.6 F (37 C)  Resp: 18    Intake/Output Summary (Last 24 hours) at 05/07/15 1211 Last data filed at 05/07/15 0915  Gross per 24 hour  Intake    660 ml  Output    600 ml  Net     60 ml   Filed Weights   05/05/15 0305 05/06/15 0417 05/07/15 0517  Weight: 60.737 kg (133 lb 14.4 oz) 62.279 kg (137 lb 4.8 oz) 62.596 kg (138 lb)    Exam:   General:  tearful  Cardiovascular: irr  Respiratory:  clear  Abdomen: +BS, soft  Musculoskeletal: no edema   Data Reviewed: Basic Metabolic Panel:  Recent Labs Lab 05/04/15 1952 05/05/15 0330  NA 139 142  K 4.3 4.0  CL 102 109  CO2 27 23  GLUCOSE 154* 128*  BUN 24* 21*  CREATININE 1.03* 0.97  CALCIUM 9.1 8.0*   Liver Function Tests:  Recent Labs Lab 05/05/15 0121 05/05/15 0330  AST 17 20  ALT 12* 13*  ALKPHOS 56 52  BILITOT 0.3 0.4  PROT 5.6* 5.2*  ALBUMIN 3.1* 2.9*    Recent Labs Lab 05/05/15 0121  LIPASE 22   No results for input(s): AMMONIA in the last 168 hours. CBC:  Recent Labs Lab 05/04/15 1952 05/05/15 0330 05/05/15 0716 05/06/15 0300 05/07/15 0004  WBC 9.1 6.1  6.1 5.2 4.7 4.8  HGB 8.8* 6.6*  6.6* 5.4* 8.7* 8.5*  HCT 29.4* 21.4*  21.5* 17.7* 27.7* 27.4*  MCV 74.4* 73.3*  72.9* 73.8* 77.8* 79.0  PLT 289 167  159 169 143* 139*   Cardiac Enzymes:  Recent Labs Lab 05/06/15 1135 05/06/15 1735 05/07/15 0004  TROPONINI <0.03 <0.03 <0.03   BNP (last 3 results)  Recent Labs  02/12/15 2130 05/05/15 0303  BNP 394.8* 103.7*    ProBNP (last 3 results) No results for input(s): PROBNP in the last 8760 hours.  CBG:  Recent Labs Lab 05/04/15 2025 05/06/15 0649 05/07/15 0631 05/07/15 1143  GLUCAP 167* 118* 123* 208*    No results found for this or any previous visit (from the past 240 hour(s)).   Studies: No results found.  Scheduled  Meds: . amLODipine  10 mg Oral Daily  . atorvastatin  20 mg Oral q1800  . [START ON 05/10/2015] ferumoxytol  510 mg Intravenous Once  . gabapentin  100 mg Oral TID  . irbesartan  150 mg Oral Daily  . metoprolol  2.5 mg Intravenous Once  . metoprolol  25 mg Oral BID  . pantoprazole  40 mg Oral BID  . sodium chloride  3 mL Intravenous Q12H  . temazepam  15 mg Oral QHS  . vitamin B-12  1,000 mcg Oral Daily   Continuous Infusions:  Antibiotics Given (last 72 hours)    None      Principal Problem:   Nausea vomiting and diarrhea Active  Problems:   PAF (paroxysmal atrial fibrillation) ([name])   Long term current use of anticoagulant therapy   Chronic diastolic (congestive) heart failure ([name])   Aortic stenosis   Back pain   Hypertension   Diverticulosis of large intestine without diverticulitis   Shortness of breath   Anemia   HLD (hyperlipidemia)   Abdominal pain   GERD (gastroesophageal reflux disease)   IDA (iron deficiency anemia)   Occult GI bleeding    Time spent: 25 min    Kati Riggenbach  [facility] Pager [phone]. If 7PM-7AM, please contact night-coverage at [email or web address], password [facility] 05/07/2015, 12:11 PM  LOS: 2 days

## 2015-05-07 NOTE — Consult Note (Signed)
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 Nausea  . Near Syncope  : I lost some blood and they found cancer in my throat.  HPI: [name] is a very charming 74 year old white female. She has a history of a atrial fibrillation. She is on Coumadin. She has history of aortic stenosis.  She's had history of GI bleeding. She's been evaluated in the past.  She was admitted on November 1. She was found with a hemoglobin of 8.8. This ultimately went down to 5.4. I think she's been transfused. Again, she's been on Coumadin.  She underwent upper endoscopy. A lesion was noted at the GE junction. This was not seen on CT scan. Biopsies were taken. The pathology report ([id number]) showed adenocarcinoma.  She had an upper endoscopy 3 months ago. It was commented that there was a stricture at the GE junction. No biopsies were taken.  She has no pain. She's had a pretty decent appetite. She's not lost weight.  She's had no nausea or vomiting. She does have a hiatal hernia on her x-rays.  She is from [place].  We're kindly asked to see her to try to help with recommendations for management.     Past Medical History  Diagnosis Date  . CHF (congestive heart failure) ([place])   . Hypertension   . Broken back 2013    chronic back pain.   . Atrial fibrillation ([place])   . GERD (gastroesophageal reflux disease)   . Arthritis   . Stroke ([place])   . Pulmonary emboli ([place]) 2008, 2012  . Anemia 2005    generally microcytic, transfusions in [address], 2012, 02/2015, 05/2015  . HH (hiatus hernia) 2008    large with associated erosions.   :  Past Surgical History  Procedure Laterality Date  . Lumbar disc surgery  1991  . Lumbar back surgery  2012  . Cervical disc surgery  2014  . Cholecystectomy  1980s    open  . Appendectomy  1950s  . Tonsillectomy and adenoidectomy  1960s  . Carpal tunnel release Bilateral 1990s   . Esophagogastroduodenoscopy N/A 02/16/2015    Procedure: ESOPHAGOGASTRODUODENOSCOPY (EGD);  Surgeon: [name], MD;  Location: [facility] END[facility]COPY;  Service: Endoscopy;  Laterality: N/A;  . Colonoscopy N/A 02/17/2015    Procedure: COLONOSCOPY;  Surgeon: [name], MD;  Location: [facility] ENDOSCOPY;  Service: Endoscopy;  Laterality: N/A;  . Exploratory lab  1950s or 1960s  . Esophagogastroduodenoscopy N/A 05/06/2015    Procedure: ESOPHAGOGASTRODUODENOSCOPY (EGD);  Surgeon: [name], MD;  Location: [facility] ENDOSCOPY;  Service: Endoscopy;  Laterality: N/A;  . Givens capsule study N/A 05/06/2015    Procedure: GIVENS CAPSULE STUDY;  Surgeon: [name], MD;  Location: [facility] ENDOSCOPY;  Service: Endoscopy;  Laterality: N/A;  :   Current facility-administered medications:  .  amLODipine (NORVASC) tablet 10 mg, 10 mg, Oral, Daily, Ivor Costa, MD, 10 mg at 05/07/15 1011 .  atorvastatin (LIPITOR) tablet 20 mg, 20 mg, Oral, q1800, Ivor Costa, MD, 20 mg at 05/07/15 1719 .  butalbital-acetaminophen-caffeine (FIORICET, ESGIC) 50-325-40 MG per tablet 1 tablet, 1 tablet, Oral, Q4H PRN, Ivor Costa, MD .  cloNIDine (CATAPRES) tablet 0.1 mg, 0.1 mg, Oral, Daily PRN, Ivor Costa, MD .  diphenhydrAMINE (BENADRYL) capsule 25 mg, 25 mg, Oral, Q6H PRN, Geradine Girt, DO .  [START ON 05/10/2015] ferumoxytol (FERAHEME) 510 mg in sodium chloride 0.9 % 100  mL IVPB, 510 mg, Intravenous, Once, Thuy D Dang, RPH .  gabapentin (NEURONTIN) capsule 100 mg, 100 mg, Oral, TID, Ivor Costa, MD, 100 mg at 05/07/15 1719 .  irbesartan (AVAPRO) tablet 150 mg, 150 mg, Oral, Daily, Ivor Costa, MD, 150 mg at 05/07/15 1010 .  meclizine (ANTIVERT) tablet 25 mg, 25 mg, Oral, TID PRN, Ivor Costa, MD .  metoprolol (LOPRESSOR) injection 2.5 mg, 2.5 mg, Intravenous, Once, Jeryl Columbia, NP, 2.5 mg at 05/05/15 2315 .  metoprolol tartrate (LOPRESSOR) tablet 25 mg, 25 mg, Oral, BID, Ivor Costa, MD, 25 mg at 05/07/15 1005 .  ondansetron (ZOFRAN) injection  4 mg, 4 mg, Intravenous, Q8H PRN, Ivor Costa, MD, 4 mg at 05/06/15 1015 .  pantoprazole (PROTONIX) EC tablet 40 mg, 40 mg, Oral, BID, Jessica U Vann, DO, 40 mg at 05/07/15 1005 .  sodium chloride 0.9 % injection 3 mL, 3 mL, Intravenous, Q12H, Ivor Costa, MD, 3 mL at 05/06/15 2200 .  temazepam (RESTORIL) capsule 15 mg, 15 mg, Oral, QHS, Ivor Costa, MD, 15 mg at 05/06/15 2128 .  tiZANidine (ZANAFLEX) tablet 2 mg, 2 mg, Oral, Q6H PRN, Ivor Costa, MD .  traMADol Veatrice Bourbon) tablet 50 mg, 50 mg, Oral, Q6H PRN, Geradine Girt, DO .  vitamin B-12 (CYANOCOBALAMIN) tablet 1,000 mcg, 1,000 mcg, Oral, Daily, Geradine Girt, DO, 1,000 mcg at 05/07/15 1005:  . amLODipine  10 mg Oral Daily  . atorvastatin  20 mg Oral q1800  . [START ON 05/10/2015] ferumoxytol  510 mg Intravenous Once  . gabapentin  100 mg Oral TID  . irbesartan  150 mg Oral Daily  . metoprolol  2.5 mg Intravenous Once  . metoprolol  25 mg Oral BID  . pantoprazole  40 mg Oral BID  . sodium chloride  3 mL Intravenous Q12H  . temazepam  15 mg Oral QHS  . vitamin B-12  1,000 mcg Oral Daily  :  Allergies  Allergen Reactions  . Iodinated Diagnostic Agents Anaphylaxis    Info given by patient  . Milk-Related Compounds     Lactose intolerance  . Darvon [Propoxyphene] Rash  . Hydralazine Anxiety    Facial flushing, pt prefers not to use it.   :  Family History  Problem Relation Age of Onset  . Stroke Mother   . Heart disease Mother   . Emphysema Father   . Ovarian cancer Sister   . Stroke Sister   . Other Child     died at birth  :  Social History   Social History  . Marital Status: Divorced    Spouse Name: N/A  . Number of Children: 3  . Years of Education: 14   Occupational History  . Retired    Social History Main Topics  . Smoking status: Never Smoker   . Smokeless tobacco: Never Used  . Alcohol Use: No  . Drug Use: No  . Sexual Activity: Not on file   Other Topics Concern  . Not on file   Social History  Narrative   Born and raised in [place], [place]. Currently resides in a house with her son. 1 dog. Fun: go to church.   Denies religious beliefs that would effect health care.   :  Pertinent items are noted in HPI.  Exam: Patient Vitals for the past 24 hrs:  BP Temp Temp src Pulse Resp SpO2 Weight  05/07/15 1236 (!) 142/69 mmHg 98.2 F (36.8 C) Oral 66 17 96 % -  05/07/15  0945 (!) 162/48 mmHg 98.6 F (37 C) Oral 72 18 - -  05/07/15 0810 (!) 166/51 mmHg 97.9 F (36.6 C) Oral 72 18 95 % -  05/07/15 0517 (!) 176/80 mmHg 98.3 F (36.8 C) Oral 70 18 98 % 138 lb (62.596 kg)  05/07/15 0032 (!) 149/60 mmHg 98.8 F (37.1 C) Oral 71 17 96 % -  05/06/15 2132 (!) 153/83 mmHg 99.3 F (37.4 C) Oral 76 16 95 % -    well-developed well-nourished white female in no obvious distress. Head and neck exam shows no ocular or oral lesions. She has no palpable cervical or supraclavicular lymph nodes. Lungs are clear to percussion and auscultation bilaterally. Cardiac exam irregular  rate and rhythm consistent with atrial fibrillation. There are no no murmurs, rubs or bruits. Abdomen is soft. She has good bowel sounds. There is no fluid wave. There is no guarding or rebound tenderness. There is no palpable liver or spleen tip. Extremities shows some trace edema in her lower extremities. Has good range of motion of her joints. She has good strength. Skin exam shows no rashes, ecchymoses or petechia. Neurological exam shows no focal neurological deficits.    Recent Labs  05/06/15 0300 05/07/15 0004  WBC 4.7 4.8  HGB 8.7* 8.5*  HCT 27.7* 27.4*  PLT 143* 139*    Recent Labs  05/04/15 1952 05/05/15 0330  NA 139 142  K 4.3 4.0  CL 102 109  CO2 27 23  GLUCOSE 154* 128*  BUN 24* 21*  CREATININE 1.03* 0.97  CALCIUM 9.1 8.0*    Blood smear review:  None  Pathology: See above     Assessment and Plan:  [name] is a very charming 74 year old white female. She has cardiac history with atrial  fibrillation. She has aortic stenosis. She did have a recent echocardiogram which showed very good ejection fraction.  She was found to have adenocarcinoma of the GE junction. I find hard to believe that this would cause her GI bleeding. Personally, I think this was found in a very fortuitous of [place] in I give Dr. Hilarie Fredrickson a lot of credit for being diligent and finding this and biopsying this.  It looks like this would be a very early stage adenocarcinoma of the GE junction. One would have to think that this would be either a T1 or T2 lesion. There is nothing on CT scan that shows any thickening in this area of the esophagus and no surrounding adenopathy.  She is scheduled for an endoscopic ultrasound. I think this we done as an outpatient.  I not sure any other staging studies need to be done. I think that if she has a T3 or T4 lesion, then maybe a PET scan could be done to make sure there is no occult metastatic disease.  I did the real issue is whether not she is a surgical candidate. I would think for early stage cancer, that surgical intervention by itself would be appropriate. If the surgeons do not feel that she would tolerate surgical resection, then I would clearly recommend radiation therapy with chemotherapy.  I, at the present time, do not see any obvious role for neoadjuvant radiation and chemotherapy given the inflation that we have. It just looks like this is a very small lesion and that neoadjuvant therapy would be "overtreatment".  I spoke to Ms. Janoski and her son. They're both very nice.  She lives in [place] but would like to have her care in [place]. I  will speak with Dr. Benay Spice and see if he can help out. I think it would be very tough to have her come to my office out in the [place] part of [place].  We had good fellowship. She has a very strong faith. She certainly is not too worried about the future.  I spent about 45 minutes with she and her son. I  answered all their questions.  Thank you so much for allowing [place] to see her.   Pete E.  Isaiah 41:10

## 2015-05-07 NOTE — Progress Notes (Signed)
ANTICOAGULATION CONSULT NOTE - Initial Consult  Pharmacy Consult for Coumadin Indication: Afib, history of PE  Allergies  Allergen Reactions  . Iodinated Diagnostic Agents Anaphylaxis    Info given by patient  . Milk-Related Compounds     Lactose intolerance  . Darvon [Propoxyphene] Rash  . Hydralazine Anxiety    Facial flushing, pt prefers not to use it.     Patient Measurements: Height: 5\' 2"  (157.5 cm) Weight: 138 lb (62.596 kg) IBW/kg (Calculated) : 50.1  Vital Signs: Temp: 98.2 F (36.8 C) (11/04 1236) Temp Source: Oral (11/04 1236) BP: 142/69 mmHg (11/04 1236) Pulse Rate: 66 (11/04 1236)  Labs:  Recent Labs  05/04/15 1952 05/04/15 2050 05/05/15 0330 05/05/15 0716 05/06/15 0300 05/06/15 1135 05/06/15 1735 05/07/15 0004  HGB 8.8*  --  6.6*  6.6* 5.4* 8.7*  --   --  8.5*  HCT 29.4*  --  21.4*  21.5* 17.7* 27.7*  --   --  27.4*  PLT 289  --  167  159 169 143*  --   --  139*  APTT  --   --  35  --   --   --   --   --   LABPROT  --  29.4*  --   --   --   --   --   --   INR  --  2.85*  --   --   --   --   --   --   CREATININE 1.03*  --  0.97  --   --   --   --   --   TROPONINI  --   --   --   --   --  <0.03 <0.03 <0.03    Estimated Creatinine Clearance: 43.6 mL/min (by C-G formula based on Cr of 0.97).   Medical History: Past Medical History  Diagnosis Date  . CHF (congestive heart failure) ([place])   . Hypertension   . Broken back 2013    chronic back pain.   . Atrial fibrillation ([place])   . GERD (gastroesophageal reflux disease)   . Arthritis   . Stroke ([place])   . Pulmonary emboli ([place]) 2008, 2012  . Anemia 2005    generally microcytic, transfusions in [address], 2012, 02/2015, 05/2015  . HH (hiatus hernia) 2008    large with associated erosions.     Assessment: 74 year old female previously on Coumadin for Afib, PE history admitted with low Hgb and bloody stools 11/1.  Coumadin on hold since that time.  INR on admission therapeutic on alternating 5 /  2.5 mg dose  To resume Coumadin today  Goal of Therapy:  INR 2-3 Monitor platelets by anticoagulation protocol: Yes   Plan:  Coumadin 5 mg po x 1 dose at 1800 pm Daily INR  Thank you Anette Guarneri, PharmD [phone]  05/07/2015,1:06 PM

## 2015-05-07 NOTE — Progress Notes (Signed)
          Daily Rounding Note  05/07/2015, 11:19 AM  LOS: 2 days   SUBJECTIVE:       Feels sad, depressed and had a "meltdown" earlier this AM. All because of her significant medical problems  OBJECTIVE:         Vital signs in last 24 hours:    Temp:  [97.9 F (36.6 C)-99.3 F (37.4 C)] 98.6 F (37 C) (11/04 0945) Pulse Rate:  [38-121] 72 (11/04 0945) Resp:  [15-24] 18 (11/04 0945) BP: (114-192)/(35-83) 162/48 mmHg (11/04 0945) SpO2:  [95 %-100 %] 95 % (11/04 0810) Weight:  [138 lb (62.596 kg)] 138 lb (62.596 kg) (11/04 0517) Last BM Date: 05/04/15 Filed Weights   05/05/15 0305 05/06/15 0417 05/07/15 0517  Weight: 133 lb 14.4 oz (60.737 kg) 137 lb 4.8 oz (62.279 kg) 138 lb (62.596 kg)   General: pleasant, looks tired.  Alert and appropriate, not confused. No dyspnea. Seen while sitting on commode.   No formal exam.   Intake/Output from previous day: 11/03 0701 - 11/04 0700 In: 615 [P.O.:615] Out: 950 [Urine:950]  Intake/Output this shift: Total I/O In: 120 [P.O.:120] Out: -   Lab Results:  Recent Labs  05/05/15 0716 05/06/15 0300 05/07/15 0004  WBC 5.2 4.7 4.8  HGB 5.4* 8.7* 8.5*  HCT 17.7* 27.7* 27.4*  PLT 169 143* 139*   BMET  Recent Labs  05/04/15 1952 05/05/15 0330  NA 139 142  K 4.3 4.0  CL 102 109  CO2 27 23  GLUCOSE 154* 128*  BUN 24* 21*  CREATININE 1.03* 0.97  CALCIUM 9.1 8.0*   LFT  Recent Labs  05/05/15 0121 05/05/15 0330  PROT 5.6* 5.2*  ALBUMIN 3.1* 2.9*  AST 17 20  ALT 12* 13*  ALKPHOS 56 52  BILITOT 0.3 0.4  BILIDIR <0.1*  --   IBILI NOT CALCULATED  --    PT/INR  Recent Labs  05/04/15 2050  LABPROT 29.4*  INR 2.85*   Hepatitis Panel No results for input(s): HEPBSAG, HCVAB, HEPAIGM, HEPBIGM in the last 72 hours.  Studies/Results: No results found.  ASSESMENT:   * Abd bloating, nausea, malaise. Non contrast CT scan unrevealing EGD 05/06/15: Tortuous  esophagus, polyp at gastric cardia (biopsied), large HH with non-bleeding [place] erosions.  Suspect intermittent bleeding from the erosions.   * ABL anemia. Microcytic, iron deficient. FOBT negative currently but reports of melenic sounding stool. Suspect she is losing blood slowly from the HH/gastric erosions. Received Feraheme 05/06/15, has another dose ordered for 11/7, but ? If she will be inpt at that date?, if not the infusion will need to be arranged in outpt setting at day hospital.   * Chronic AC, Coumadin, for hx PE and A fib. INR therapeutic.     PLAN   *  Await biopsy results.   *  Her GI follow up as needed will be with Dr Hilarie Fredrickson. If pt still inpt, will follow up on Monday. Dr Hilarie Fredrickson on call over weekend.   *  Would pt like to/benefit from pt/family d/w Palliative care?   *  If discharged, the second iron infusion will need to be set up in outpt day hospital.  Her PMD is Mauricio Po, [name] at [facility] internal medicine on [facility].     Azucena Freed  05/07/2015, 11:19 AM Pager: [phone]

## 2015-05-07 NOTE — Care Management Important Message (Signed)
Important Message  Patient Details  Name: [name] MRN: [id number] Date of Birth: 1941/04/24   Medicare Important Message Given:  Yes-second notification given    Delorse Lek 05/07/2015, 1:25 PM

## 2015-05-07 NOTE — Progress Notes (Signed)
Physical Therapy Treatment Patient Details Name: [name] MRN: [id number] DOB: 05-28-1941 Today's Date: 05/07/2015    History of Present Illness 74 y.o. female with PMH of GI bleeding, diverticulosis, hypertension, hyperlipidemia, GERD, anxiety, diastolic congestive heart failure, atrial fibrillation on Coumadin, stroke, arthritis, chronic back pain, aortic stenosis, PE, who presents with nausea, vomiting, diarrhea, abdominal pain, dizziness, and bloody stool.    PT Comments    Pt able to increase ambulation distance today with use of RW due to feeling of unsteadiness.  Con't to recommend HHPT.  Follow Up Recommendations  Home health PT;Supervision/Assistance - 24 hour     Equipment Recommendations  None recommended by PT    Recommendations for Other Services       Precautions / Restrictions Precautions Precautions: Fall    Mobility  Bed Mobility         Supine to sit: Modified independent (Device/Increase time)        Transfers Overall transfer level: Needs assistance Equipment used: Rolling walker (2 wheeled) Transfers: Sit to/from Stand Sit to Stand: Supervision         General transfer comment: S for safety  Ambulation/Gait Ambulation/Gait assistance: Min guard Ambulation Distance (Feet): 120 Feet Assistive device: Rolling walker (2 wheeled) Gait Pattern/deviations: Step-through pattern;Trunk flexed Gait velocity: decreased   General Gait Details: Pt reported feeling unsteady today so used RW.  Discussed using RW until she got stronger and HHPT cleared her for decreased use of AD   Stairs            Wheelchair Mobility    Modified Rankin (Stroke Patients Only)       Balance Overall balance assessment: Needs assistance Sitting-balance support: Feet supported;No upper extremity supported Sitting balance-Leahy Scale: Good     Standing balance support: During functional activity Standing balance-Leahy Scale: Fair                       Cognition Arousal/Alertness: Awake/alert Behavior During Therapy: WFL for tasks assessed/performed Overall Cognitive Status: Within Functional Limits for tasks assessed                      Exercises      General Comments General comments (skin integrity, edema, etc.): Pt ambulated to bathroom and used toilet then ambulated in hallway      Pertinent Vitals/Pain Pain Assessment: Faces Faces Pain Scale: No hurt    Home Living                      Prior Function            PT Goals (current goals can now be found in the care plan section) Acute Rehab PT Goals Patient Stated Goal: feel better PT Goal Formulation: With patient Time For Goal Achievement: 05/20/15 Potential to Achieve Goals: Good Progress towards PT goals: Progressing toward goals    Frequency  Min 3X/week    PT Plan Current plan remains appropriate    Co-evaluation             End of Session Equipment Utilized During Treatment: Gait belt Activity Tolerance: Patient limited by fatigue Patient left: in chair;with call bell/phone within reach;with family/visitor present     Time: 8546-2703 PT Time Calculation (min) (ACUTE ONLY): 16 min  Charges:  $Gait Training: 8-22 mins                    G Codes:  [name] 05/07/2015, 2:10 PM

## 2015-05-08 LAB — GLUCOSE, CAPILLARY: Glucose-Capillary: 131 mg/dL — ABNORMAL HIGH (ref 65–99)

## 2015-05-08 LAB — BASIC METABOLIC PANEL
ANION GAP: 6 (ref 5–15)
CHLORIDE: 107 mmol/L (ref 101–111)
CO2: 27 mmol/L (ref 22–32)
Calcium: 8.3 mg/dL — ABNORMAL LOW (ref 8.9–10.3)
Creatinine, Ser: 0.9 mg/dL (ref 0.44–1.00)
GFR calc Af Amer: >60 mL/min (ref >60)
GLUCOSE: 164 mg/dL — AB (ref 65–99)
POTASSIUM: 3.4 mmol/L — AB (ref 3.5–5.1)
Sodium: 140 mmol/L (ref 135–145)

## 2015-05-08 LAB — CBC
HEMATOCRIT: 26.9 % — AB (ref 36.0–46.0)
Hemoglobin: 8.3 g/dL — ABNORMAL LOW (ref 12.0–15.0)
MCH: 24.3 pg — AB (ref 26.0–34.0)
MCHC: 30.9 g/dL (ref 30.0–36.0)
MCV: 78.9 fL (ref 78.0–100.0)
PLATELETS: 134 10*3/uL — AB (ref 150–400)
RBC: 3.41 MIL/uL — AB (ref 3.87–5.11)
RDW: 16.7 % — ABNORMAL HIGH (ref 11.5–15.5)
WBC: 4.2 10*3/uL (ref 4.0–10.5)

## 2015-05-08 MED ORDER — PANTOPRAZOLE SODIUM 40 MG PO TBEC: 40.0000 mg | DELAYED_RELEASE_TABLET | Freq: Two times a day (BID) | ORAL | Status: DC

## 2015-05-08 MED ORDER — CYANOCOBALAMIN 1000 MCG PO TABS: 1000.0000 ug | ORAL_TABLET | Freq: Every day | ORAL | Status: DC

## 2015-05-08 MED ORDER — CLONIDINE HCL 0.1 MG PO TABS: 0.1000 mg | ORAL_TABLET | Freq: Every day | ORAL | Status: DC | PRN

## 2015-05-08 NOTE — Discharge Summary (Signed)
Physician Discharge Summary  [name] PJA:[id number] DOB: 1940-08-11 DOA: 05/04/2015  PCP: Mauricio Po, [name]  Admit date: 05/04/2015 Discharge date: 05/08/2015  Time spent: 35 minutes  Recommendations for Outpatient Follow-up:  1. Newly diagnosed adenocarcinoma- will be discussed at tumor board Wednesday- EUS by Rande Brunt 2. IV Fe this week- order placed for short stay  Discharge Diagnoses:  Principal Problem:   Nausea vomiting and diarrhea Active Problems:   PAF (paroxysmal atrial fibrillation) ([place])   Long term current use of anticoagulant therapy   Chronic diastolic (congestive) heart failure ([name])   Aortic stenosis   Back pain   Hypertension   Diverticulosis of large intestine without diverticulitis   Shortness of breath   Anemia   HLD (hyperlipidemia)   Abdominal pain   GERD (gastroesophageal reflux disease)   IDA (iron deficiency anemia)   Occult GI bleeding   Esophageal adenocarcinoma ([place])   Cameron lesion, acute   Discharge Condition: improved  Diet recommendation: as tolerated  Filed Weights   05/06/15 0417 05/07/15 0517 05/08/15 0511  Weight: 62.279 kg (137 lb 4.8 oz) 62.596 kg (138 lb) 61.78 kg (136 lb 3.2 oz)    History of present illness:  [name] is a 74 y.o. female with PMH of GI bleeding, diverticulosis, hypertension, hyperlipidemia, GERD, anxiety, diastolic congestive heart failure, atrial fibrillation on Coumadin, stroke, arthritis, chronic back pain, aortic stenosis, PE, who presents with nausea, vomiting, diarrhea, abdominal pain, dizziness, bloody stool.  Patient reports that he has been having intermittent nausea, vomiting, diarrhea, abdominal pain in the past 7 days. No recent antibiotics used. She developed dizziness, lightheadedness and generalized weakness. She had bloody stool several times in the past several days. Her abdominal pain is diffuse, moderate, nonradiating. Patient has mild cough and shortness of breath, but  currently no chest pain, fever or chills. No unilateral weakness, symptoms of UTI. She reports that she had chest pain several days ago, which has resolved, currently no chest pain.  In ED, patient was found to have negative FOBT, hemoglobin dropped from 11.4 on 03/09/15 -->8.8, INR 2.85, troponin negative, negative urinalysis, temperature 99.3, no tachycardia, electrolytes okay. CT abdomen/pelvis without contrast showed large hiatal hernia and diverticulosis without diverticulitis. Patient is admitted to inpatient for further evaluation and treatment.  Hospital Course:  Nausea vomiting, diarrhea and AP:  - CT abdomen/pelvis did not show acute abnormalities, but showed large hiatal hernia and diverticulosis. Differential diagnosis include viral or bacterial gastroenteritis, C. difficile colitis, hepatic biliary system problem and pancreatitis. -lipase and LFT ok  Microcytic Anemia: Hemoglobin dropped from 11.4-->8.8. MCV 74.4.  -anemia panel- FE def--- IV Fe - GI: large HH with non-bleeding Cameron erosions. Suspect intermittent bleeding from the erosions.  -pathology per GI back as adenocarcinoma-- needs EUS  Atrial Fibrillation: CHA2DS2-VASc Score is 7 -hold coumadin - as procedure planned-- spoke with patient about increased risk of stroke- son also involved in converstation -Continue metoprolol  Chronic diastolic (congestive) heart failure ([place]): 2-D alcohol 02/03/15 showed EF 60-65%. Patient does not have leg edema. CHF is compensated on admission.   Chronic Back pain: -tramadol  Hypertension: Resume home meds  HLD: Last LDL was 85 on 05/14/10 -Continue home medications: Lipitor    Procedures:  EGD with biopsy  Consultations:  Oncology  GI  Discharge Exam: Filed Vitals:   05/08/15 0850  BP: 158/58  Pulse: 79  Temp: 98.2 F (36.8 C)  Resp: 18    General: awake, NAD Cardiovascular: rrr Respiratory: clear  Discharge  Instructions   Discharge Instructions     AMB referral to short stay    Complete by:  As directed   Feraheme 510 mg IV once on Monday, 05/10/15--- please call patient to schedule time: [phone]     Diet general    Complete by:  As directed      Discharge instructions    Complete by:  As directed   Home health EUS- outpatient- Dr. Ardis Hughs-- will call with schedule     Increase activity slowly    Complete by:  As directed           Current Discharge Medication List    START taking these medications   Details  vitamin B-12 1000 MCG tablet Take 1 tablet (1,000 mcg total) by mouth daily. Qty: 30 tablet, Refills: 0      CONTINUE these medications which have CHANGED   Details  cloNIDine (CATAPRES) 0.1 MG tablet Take 1 tablet (0.1 mg total) by mouth daily as needed (high blood pressure, will take if levels are 180/90). Qty: 30 tablet, Refills: 0    pantoprazole (PROTONIX) 40 MG tablet Take 1 tablet (40 mg total) by mouth 2 (two) times daily. Qty: 60 tablet, Refills: 0      CONTINUE these medications which have NOT CHANGED   Details  amLODipine (NORVASC) 10 MG tablet Take 1 tablet (10 mg total) by mouth daily. Qty: 90 tablet, Refills: 3    atorvastatin (LIPITOR) 20 MG tablet Take 20 mg by mouth daily.    butalbital-acetaminophen-caffeine (FIORICET, ESGIC) 50-325-40 MG per tablet Take 1 tablet by mouth every 4 (four) hours as needed for headache. Qty: 14 tablet, Refills: 0    furosemide (LASIX) 40 MG tablet TAKE 1 TO 2 TABLETS BY MOUTH ONCE DAILY. Qty: 30 tablet, Refills: 0    gabapentin (NEURONTIN) 100 MG capsule TAKE 1 CAPSULE BY MOUTH THREE TIMES A DAY. Qty: 90 capsule, Refills: 2    meclizine (ANTIVERT) 25 MG tablet Take 1 tablet (25 mg total) by mouth 3 (three) times daily as needed for dizziness. Qty: 30 tablet, Refills: 0    metoCLOPramide (REGLAN) 10 MG tablet Take 10 mg by mouth every 6 (six) hours as needed for nausea.    metoprolol (LOPRESSOR) 25 MG tablet Take 1 tablet (25 mg total) by mouth 2 (two)  times daily. Qty: 60 tablet, Refills: 0    oxycodone (OXY-IR) 5 MG capsule Take 1 capsule (5 mg total) by mouth 2 (two) times daily as needed for pain. Qty: 60 capsule, Refills: 0   Associated Diagnoses: Primary osteoarthritis involving multiple joints    temazepam (RESTORIL) 15 MG capsule Take 1 capsule (15 mg total) by mouth at bedtime. Qty: 30 capsule, Refills: 1   Associated Diagnoses: Sleep disturbance    tizanidine (ZANAFLEX) 2 MG capsule Take 2 mg by mouth every 6 (six) hours as needed for muscle spasms.    traMADol (ULTRAM) 50 MG tablet Take 1 tablet (50 mg total) by mouth every 8 (eight) hours as needed for moderate pain. Qty: 90 tablet, Refills: 0   Associated Diagnoses: Primary osteoarthritis involving multiple joints    valsartan-hydrochlorothiazide (DIOVAN-HCT) 320-25 MG tablet Take 0.5 tablets by mouth daily.    NONFORMULARY OR COMPOUNDED ITEM Diclofenac/Baclofen/Cyclobenzaprine/Gabapentin/Lidocaine 3/2/2/6/2.5% Cream  Apply to affected area 2-3 times per day as needed for arthritic pain. Qty: 1 each, Refills: 1   Associated Diagnoses: Primary osteoarthritis involving multiple joints      STOP taking these medications     simvastatin (  ZOCOR) 20 MG tablet      warfarin (COUMADIN) 5 MG tablet        Allergies  Allergen Reactions  . Iodinated Diagnostic Agents Anaphylaxis    Info given by patient  . Milk-Related Compounds     Lactose intolerance  . Darvon [Propoxyphene] Rash  . Hydralazine Anxiety    Facial flushing, pt prefers not to use it.    Follow-up Information    Follow up with [facility].   Why:  They will do your home health care at your home   Contact information:   [address] [place] [address] [phone]       Follow up with Mauricio Po, [name] In 1 week.   Specialty:  Family Medicine   Contact information:   [place] [address] [phone]       Follow up with [name], MD.    Specialty:  Gastroenterology   Why:  will call with appointment   Contact information:   [address] [place] [address] [phone]       Follow up with Volanda Napoleon, MD.   Specialty:  Oncology   Why:  will call with appointments   Contact information:   [address] [place] [address] [phone]        The results of significant diagnostics from this hospitalization (including imaging, microbiology, ancillary and laboratory) are listed below for reference.    Significant Diagnostic Studies: Ct Abdomen Pelvis Wo Contrast  05/05/2015  CLINICAL DATA:  Abdominal pain and lightheadedness.  Nausea. EXAM: CT ABDOMEN AND PELVIS WITHOUT CONTRAST TECHNIQUE: Multidetector CT imaging of the abdomen and pelvis was performed following the standard protocol without IV contrast. COMPARISON:  05/11/2014 FINDINGS: There is a large hiatal hernia. Stomach and small bowel are otherwise unremarkable. Prior appendectomy. Mild colonic diverticulosis. No evidence of diverticulitis or other acute inflammatory process. Unremarkable appearances of the kidneys, ureters and urinary bladder. Incidentally noted upper pole left renal cyst, unchanged. Adrenals are normal. Prior cholecystectomy. Moderate dilatation of the extrahepatic bile ducts, likely due to the cholecystectomy. Unremarkable unenhanced appearances of the liver, spleen, and pancreas. Incidentally noted benign calcified 1.7 cm lesion at the anterior periphery of the spleen, unchanged. Extensive atherosclerotic calcification of the aorta and iliac arteries. Normal caliber. Uterus and adnexal regions are unremarkable. No significant abnormality in the lower chest except for the large hiatal hernia. No significant musculoskeletal abnormality. Incidentally noted small fat containing umbilical hernia. IMPRESSION: 1. No acute findings are evident in the abdomen or pelvis. 2. Large hiatal hernia. 3. Post cholecystectomy expansion of  the extrahepatic biliary system, unchanged. 4. Diverticulosis. Electronically Signed   By: Andreas Newport M.D.   On: 05/05/2015 00:58   Ct Chest Wo Contrast  05/07/2015  CLINICAL DATA:  New diagnosis of tumor at gastroesophageal junction. Large hiatal hernia. Esophageal cancer. EXAM: CT CHEST WITHOUT CONTRAST TECHNIQUE: Multidetector CT imaging of the chest was performed following the standard protocol without IV contrast. COMPARISON:  02/14/2015 chest CT.  05/05/2015 abdominal pelvic CT. FINDINGS: Mediastinum/Nodes: No supraclavicular adenopathy. Aortic and branch vessel atherosclerosis. Moderate cardiomegaly with multivessel coronary artery atherosclerosis. Aortic valvular calcifications. Mitral annular calcifications. No mediastinal or definite hilar adenopathy, given limitations of unenhanced CT. A moderate hiatal hernia again identified. No esophageal obstruction or periesophageal adenopathy. Lungs/Pleura: Small right and trace left pleural effusions, slightly increased. Minimal motion degradation inferiorly.  Clear lungs. Upper abdomen: Cholecystectomy. Normal imaged portions of  the liver, adrenal glands, kidneys, pancreas. A calcified anterior splenic lesion is again identified at 11 mm (image 49, series 2). No upper abdominal adenopathy identified. Abdominal aortic and branch vessel atherosclerosis. Musculoskeletal: Lower cervical spine fixation. Thoracic spondylosis. IMPRESSION: 1.  No acute process or evidence of metastatic disease in the chest. 2. Minimal motion degradation inferiorly. 3. Slight increase in small right larger than left pleural effusions. 4. Moderate hiatal hernia. 5.  Atherosclerosis, including within the coronary arteries. 6. Aortic valvular calcifications which may represent valvular disease. Consider echocardiography. 7. Calcified splenic lesion is unchanged and likely related to remote infection or trauma. Electronically Signed   By: Abigail Miyamoto M.D.   On: 05/07/2015 17:02     Microbiology: No results found for this or any previous visit (from the past 240 hour(s)).   Labs: Basic Metabolic Panel:  Recent Labs Lab 05/04/15 1952 05/05/15 0330 05/08/15 0318  NA 139 142 140  K 4.3 4.0 3.4*  CL 102 109 107  CO2 27 23 27   GLUCOSE 154* 128* 164*  BUN 24* 21* <5*  CREATININE 1.03* 0.97 0.90  CALCIUM 9.1 8.0* 8.3*   Liver Function Tests:  Recent Labs Lab 05/05/15 0121 05/05/15 0330  AST 17 20  ALT 12* 13*  ALKPHOS 56 52  BILITOT 0.3 0.4  PROT 5.6* 5.2*  ALBUMIN 3.1* 2.9*    Recent Labs Lab 05/05/15 0121  LIPASE 22   No results for input(s): AMMONIA in the last 168 hours. CBC:  Recent Labs Lab 05/05/15 0330 05/05/15 0716 05/06/15 0300 05/07/15 0004 05/08/15 0318  WBC 6.1  6.1 5.2 4.7 4.8 4.2  HGB 6.6*  6.6* 5.4* 8.7* 8.5* 8.3*  HCT 21.4*  21.5* 17.7* 27.7* 27.4* 26.9*  MCV 73.3*  72.9* 73.8* 77.8* 79.0 78.9  PLT 167  159 169 143* 139* 134*   Cardiac Enzymes:  Recent Labs Lab 05/06/15 1135 05/06/15 1735 05/07/15 0004  TROPONINI <0.03 <0.03 <0.03   BNP: BNP (last 3 results)  Recent Labs  02/12/15 2130 05/05/15 0303  BNP 394.8* 103.7*    ProBNP (last 3 results) No results for input(s): PROBNP in the last 8760 hours.  CBG:  Recent Labs Lab 05/04/15 2025 05/06/15 0649 05/07/15 0631 05/07/15 1143 05/08/15 0619  GLUCAP 167* 118* 123* 208* 131*       Signed:  Eliseo [name], [name]  [facility] 05/08/2015, 11:37 AM

## 2015-05-08 NOTE — Progress Notes (Signed)
Patient is discharge to home accompanied by family members and NT via wheelchair. Prescriptions and discharge instruction given. Patient verbalizes understanding. All personal belongings given. Denies any pain. No signs and symptoms of distress noted. Telemetry box and IV removed prior to discharge.

## 2015-05-09 LAB — TYPE AND SCREEN
ABO/RH(D): A POS
Antibody Screen: NEGATIVE
UNIT DIVISION: 0
UNIT DIVISION: 0
Unit division: 0
Unit division: 0

## 2015-05-10 ENCOUNTER — Encounter: Payer: Self-pay | Admitting: *Deleted

## 2015-05-10 ENCOUNTER — Telehealth: Payer: Self-pay | Admitting: Family

## 2015-05-10 ENCOUNTER — Telehealth: Payer: Self-pay | Admitting: *Deleted

## 2015-05-10 DIAGNOSIS — Z8673 Personal history of transient ischemic attack (TIA), and cerebral infarction without residual deficits: Secondary | ICD-10-CM | POA: Diagnosis not present

## 2015-05-10 DIAGNOSIS — Z86711 Personal history of pulmonary embolism: Secondary | ICD-10-CM | POA: Diagnosis not present

## 2015-05-10 DIAGNOSIS — D509 Iron deficiency anemia, unspecified: Secondary | ICD-10-CM | POA: Diagnosis not present

## 2015-05-10 DIAGNOSIS — C169 Malignant neoplasm of stomach, unspecified: Secondary | ICD-10-CM | POA: Diagnosis not present

## 2015-05-10 DIAGNOSIS — R262 Difficulty in walking, not elsewhere classified: Secondary | ICD-10-CM | POA: Diagnosis not present

## 2015-05-10 DIAGNOSIS — M1991 Primary osteoarthritis, unspecified site: Secondary | ICD-10-CM | POA: Diagnosis not present

## 2015-05-10 DIAGNOSIS — K5790 Diverticulosis of intestine, part unspecified, without perforation or abscess without bleeding: Secondary | ICD-10-CM | POA: Diagnosis not present

## 2015-05-10 DIAGNOSIS — K449 Diaphragmatic hernia without obstruction or gangrene: Secondary | ICD-10-CM | POA: Diagnosis not present

## 2015-05-10 DIAGNOSIS — N281 Cyst of kidney, acquired: Secondary | ICD-10-CM | POA: Diagnosis not present

## 2015-05-10 DIAGNOSIS — E785 Hyperlipidemia, unspecified: Secondary | ICD-10-CM | POA: Diagnosis not present

## 2015-05-10 DIAGNOSIS — J449 Chronic obstructive pulmonary disease, unspecified: Secondary | ICD-10-CM | POA: Diagnosis not present

## 2015-05-10 DIAGNOSIS — K219 Gastro-esophageal reflux disease without esophagitis: Secondary | ICD-10-CM | POA: Diagnosis not present

## 2015-05-10 DIAGNOSIS — G8929 Other chronic pain: Secondary | ICD-10-CM | POA: Diagnosis not present

## 2015-05-10 DIAGNOSIS — F419 Anxiety disorder, unspecified: Secondary | ICD-10-CM | POA: Diagnosis not present

## 2015-05-10 DIAGNOSIS — I110 Hypertensive heart disease with heart failure: Secondary | ICD-10-CM | POA: Diagnosis not present

## 2015-05-10 DIAGNOSIS — I350 Nonrheumatic aortic (valve) stenosis: Secondary | ICD-10-CM | POA: Diagnosis not present

## 2015-05-10 DIAGNOSIS — I480 Paroxysmal atrial fibrillation: Secondary | ICD-10-CM | POA: Diagnosis not present

## 2015-05-10 DIAGNOSIS — M47816 Spondylosis without myelopathy or radiculopathy, lumbar region: Secondary | ICD-10-CM | POA: Diagnosis not present

## 2015-05-10 DIAGNOSIS — C159 Malignant neoplasm of esophagus, unspecified: Secondary | ICD-10-CM

## 2015-05-10 DIAGNOSIS — I5032 Chronic diastolic (congestive) heart failure: Secondary | ICD-10-CM | POA: Diagnosis not present

## 2015-05-10 NOTE — Telephone Encounter (Signed)
  Oncology Nurse Navigator Documentation    Navigator Encounter Type: Telephone (05/10/15 1517): Called [name] back in response to her My Chart message regarding radiation therapy. Confirmed with her she has gastric cancer, but since tumor is at the GE junction, it is treated like an esophageal cancer. Dr. Lisbeth Renshaw and Dr. Benay Spice will explain all this to her in great detail at her consult appointments. She understands and says "It's in God's hands. Whatever happens, I'll be OK".

## 2015-05-10 NOTE — Telephone Encounter (Signed)
[facility] for PT. After review of the notes, I am not sure if a procedure is occuring or not. It appears she was restarted on coumadin for one night. If that is the case then she should resume coumadin to reduce stroke risk if ok with GI.

## 2015-05-10 NOTE — Progress Notes (Signed)
Per message from Dr. Benay Spice : Need to schedule new patient appointment here with him and radiation oncology. Adenocarcinoma of lower esophagus at GE junction. Was seen by Dr. Marin Olp, lives in [place], but wants her care in [place].

## 2015-05-10 NOTE — Telephone Encounter (Signed)
Oncology Nurse Navigator Documentation  Oncology Nurse Navigator Flowsheets 05/10/2015  Referral date to RadOnc/MedOnc 05/10/2015  Navigator Encounter Type Introductory phone call  Left VM with appointment to see Dr. Benay Spice on 05/18/15 a 2pm as new consult. Will have radiation oncology see her this week to start planning. Requested she call to confirm receipt of message. Will also send appointment via My Chart.

## 2015-05-10 NOTE — Telephone Encounter (Signed)
[name] is requesting verbals for PT- 2x/week for 4 weeks. She states that patient's coumadin was stopped in the hospital because of potential surgery. They also indicated that they instructed her to go for coumadin clinic. There is confusion on how to proceed.

## 2015-05-10 NOTE — Telephone Encounter (Signed)
Transition Care Management Follow-up Telephone Call   Date discharged? 05/08/15   How have you been since you were released from the hospital? Pt states she is doing fine   Do you understand why you were in the hospital? YES   Do you understand the discharge instructions? YES   Where were you discharged to? Home   Items Reviewed:  Medications reviewed: YES  Allergies reviewed: YES  Dietary changes reviewed: NO  Referrals reviewed: YES, she stated advance came this morning, and have her oncologist appt set up 11/15. Still waiting on appt with GI   Functional Questionnaire:   Activities of Daily Living (ADLs):   She states she are independent in the following: ambulation, bathing and hygiene, feeding, continence, grooming, toileting and dressing States she require assistance with the following: ambulation   Any transportation issues/concerns?: NO   Any patient concerns? NO   Confirmed importance and date/time of follow-up visits scheduled YES, appt 05/14/15  Provider Appointment booked with greg calone  Confirmed with patient if condition begins to worsen call PCP or go to the ER.  Patient was given the office number and encouraged to call back with question or concerns.  : YES

## 2015-05-11 ENCOUNTER — Other Ambulatory Visit: Payer: Self-pay

## 2015-05-11 ENCOUNTER — Encounter: Payer: Self-pay | Admitting: Radiation Oncology

## 2015-05-11 DIAGNOSIS — C801 Malignant (primary) neoplasm, unspecified: Secondary | ICD-10-CM

## 2015-05-11 LAB — STOOL CULTURE

## 2015-05-11 NOTE — Telephone Encounter (Signed)
LVM letting debbie know the statement below.

## 2015-05-11 NOTE — Progress Notes (Signed)
GI Location of Tumor / Histology: G/E junction early stage Adenocarcinoma  [name] presented months ago with symptoms of: Nausea,vomiting, diarrhea,abdominal pain,bloody stool, dizzyness for 7 days went to the ED 05/05/15  Biopsies of  (if applicable) revealed: Diagnosis: 11/3/216: Stomach, biopsy, cardia junction- ADENOCARCINOMA  Past/Anticipated interventions by surgeon, if any: EGD 11/3 /16 Dr. Ulice Dash Pyrtle,MD EGD 02/17/15 Dr. Lebron Quam Colonoscopy 02/17/15 Dr. Lebron Quam  Past/Anticipated interventions by medical oncology, if any:   Weight changes, if any: 4 lbs loss last wek  Bowel/Bladder complaints, if any: hx GI bleeding, none at present  Nausea / Vomiting, if any: no  Pain issues, if any:  Chronic back pain, 6/10 scale,  Yesterday head ache  10/10 from back of her head had to hold her head,  arthtitis in left hand also,   Any blood per rectum:   No  SAFETY ISSUES: YES  Prior radiation? NO  Pacemaker/ICD? NO  Possible current pregnancy? N/A  Is the patient on methotrexate? NO  Details/ Current complaints:Hx  PAF, , CHF,  Aortic stenosis hx 4 strokes, ,hx GI Bleeding, Iron deficiency Anemia, never smoker or any tobacco products, no alcohol or illicit drug use Mother Stroke, heart disease,Father Emphysema,Sister ovarian cancer,,stroke  Allergies: Iodinated diagnostic agents=Anaphylaxis per patient, Darvon=rash, Hydralazine=facial flushing,anxiety,intolerance BP 162/70 mmHg  Pulse 58  Temp(Src) 97.7 F (36.5 C) (Oral)  Resp 16  Ht 5\' 2"  (1.575 m)  Wt 133 lb 6.4 oz (60.51 kg)  BMI 24.39 kg/m2  SpO2 97%  Wt Readings from Last 3 Encounters:  05/12/15 133 lb 6.4 oz (60.51 kg)  05/08/15 136 lb 3.2 oz (61.78 kg)  04/16/15 136 lb (61.689 kg)

## 2015-05-12 ENCOUNTER — Ambulatory Visit
Admission: RE | Admit: 2015-05-12 | Discharge: 2015-05-12 | Disposition: A | Discharge status: Home / Self Care | Payer: Medicare Other | Source: Ambulatory Visit | Attending: Radiation Oncology | Admitting: Radiation Oncology

## 2015-05-12 ENCOUNTER — Encounter: Payer: Self-pay | Admitting: Radiation Oncology

## 2015-05-12 ENCOUNTER — Telehealth: Payer: Self-pay | Admitting: *Deleted

## 2015-05-12 ENCOUNTER — Ambulatory Visit: Payer: Medicare Other

## 2015-05-12 VITALS — BP 162/70 | HR 58 | Temp 97.7°F | Resp 16 | Ht 62.0 in | Wt 133.4 lb

## 2015-05-12 DIAGNOSIS — I509 Heart failure, unspecified: Secondary | ICD-10-CM | POA: Insufficient documentation

## 2015-05-12 DIAGNOSIS — Z9049 Acquired absence of other specified parts of digestive tract: Secondary | ICD-10-CM | POA: Insufficient documentation

## 2015-05-12 DIAGNOSIS — K219 Gastro-esophageal reflux disease without esophagitis: Secondary | ICD-10-CM | POA: Diagnosis not present

## 2015-05-12 DIAGNOSIS — Z8673 Personal history of transient ischemic attack (TIA), and cerebral infarction without residual deficits: Secondary | ICD-10-CM | POA: Insufficient documentation

## 2015-05-12 DIAGNOSIS — I4891 Unspecified atrial fibrillation: Secondary | ICD-10-CM | POA: Insufficient documentation

## 2015-05-12 DIAGNOSIS — C160 Malignant neoplasm of cardia: Secondary | ICD-10-CM | POA: Diagnosis not present

## 2015-05-12 DIAGNOSIS — C159 Malignant neoplasm of esophagus, unspecified: Secondary | ICD-10-CM

## 2015-05-12 DIAGNOSIS — K5792 Diverticulitis of intestine, part unspecified, without perforation or abscess without bleeding: Secondary | ICD-10-CM | POA: Insufficient documentation

## 2015-05-12 DIAGNOSIS — I10 Essential (primary) hypertension: Secondary | ICD-10-CM | POA: Diagnosis not present

## 2015-05-12 DIAGNOSIS — K449 Diaphragmatic hernia without obstruction or gangrene: Secondary | ICD-10-CM | POA: Diagnosis not present

## 2015-05-12 DIAGNOSIS — Z86711 Personal history of pulmonary embolism: Secondary | ICD-10-CM | POA: Diagnosis not present

## 2015-05-12 HISTORY — DX: Malignant neoplasm of esophagus, unspecified: C15.9

## 2015-05-12 NOTE — Telephone Encounter (Signed)
Patient left VM reporting severe headache across back of her skull and asking if it could be due to stopping coumadin for her procedure. Called back and got her VM and left message that this is doubtful. Needs to call her PCP about this, but go to ER for any s/s of blood clot or stroke.

## 2015-05-12 NOTE — Progress Notes (Signed)
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 Adenocarcinoma of the [place] ILLNESS::[name] is a 74 y.o. female who is seen for an initial consultation visit. She presented to the ED on 05/04/15 for nausea, emesis, lightheadedness, diarrhea, and abdominal pain. She also had a bloody stool for a few days. She was found to have a hemoglobin of 8.8 that ultimately dropped to 5.4. She was given transfusions because of this. CT of the abdomen and pelvis on 05/05/15 discovered a large hiatal hernia and diverticulitis. An EGD was performed on 05/06/15 by Dr. Zenovia Jarred. Biopsy of a lesion, not seen on CT, at the GE junction revealed adenocarcinoma She had an upper endoscopy 3 months ago. It was commented that there was a stricture at the GE junction, but no biopsies were taken. Chest CT found no evidence of metastasis in the chest, but noticed bilateral pleural effusions. She subsequently saw Dr. Marin Olp that day. She presents to me today for the consideration of radiotherapy for the management of her disease.  PREVIOUS RADIATION THERAPY: No   PAST MEDICAL HISTORY:  has a past medical history of CHF (congestive heart failure) ([place]); Hypertension; Broken back (2013); Atrial fibrillation ([place]); GERD (gastroesophageal reflux disease); Arthritis; Stroke ([place]); Pulmonary emboli ([place]) (2008, 2012); Anemia (2005); HH (hiatus hernia) (2008); Allergy; Esophageal cancer ([place]) (05/06/15); PONV (postoperative nausea and vomiting); Transfusion history; and H/O iron deficiency.     PAST SURGICAL HISTORY: Past Surgical History  Procedure  Laterality Date  . Lumbar disc surgery  1991  . Lumbar back surgery  2012  . Cervical disc surgery  2014  . Cholecystectomy  1980s    open  . Appendectomy  1950s  . Tonsillectomy and adenoidectomy  1960s  . Carpal tunnel release Bilateral 1990s  . Esophagogastroduodenoscopy N/A 02/16/2015    Procedure: ESOPHAGOGASTRODUODENOSCOPY (EGD);  Surgeon: [name], MD;  Location: [facility];  Service: Endoscopy;  Laterality: N/A;  . Colonoscopy N/A 02/17/2015    Procedure: COLONOSCOPY;  Surgeon: [name], MD;  Location: [facility] ENDOSCOPY;  Service: Endoscopy;  Laterality: N/A;  . Exploratory lab  1950s or 1960s  . Esophagogastroduodenoscopy N/A 05/06/2015    Procedure: ESOPHAGOGASTRODUODENOSCOPY (EGD);  Surgeon: [name], MD;  Location: [facility] ENDOSCOPY;  Service: Endoscopy;  Laterality: N/A;  . Givens capsule study N/A 05/06/2015    Procedure: GIVENS CAPSULE STUDY;  Surgeon: [name], MD;  Location: [facility] ENDOSCOPY;  Service: Endoscopy;  Laterality: N/A;     FAMILY HISTORY: family history includes Emphysema in her father; Heart disease in her mother; Other in her child; Ovarian cancer in her sister; Stroke in her mother and sister.   SOCIAL HISTORY:  reports that she has never smoked. She has never used smokeless tobacco. She reports that she does not drink alcohol or use illicit drugs.   ALLERGIES: Iodinated diagnostic agents; Milk-related compounds; Darvon; and Hydralazine   MEDICATIONS:  Current Outpatient Prescriptions  Medication Sig Dispense Refill  . amLODipine (NORVASC) 10 MG tablet Take 1 tablet (10 mg total) by mouth daily. 90 tablet 3  . cloNIDine (CATAPRES) 0.1 MG tablet Take  1 tablet (0.1 mg total) by mouth daily as needed (high blood pressure, will take if levels are 180/90). (Patient taking differently: Take 0.1 mg by mouth daily as needed (high blood pressure, take if blood pressure is >179/99.). ) 30 tablet 0  . furosemide (LASIX) 40 MG tablet TAKE 1 TO 2 TABLETS BY  MOUTH ONCE DAILY. (Patient taking differently: TAKE 1 TO 2 TABLETS BY MOUTH ONCE DAILY. Takes 2 tablets if foot swelling is bad.) 30 tablet 0  . gabapentin (NEURONTIN) 100 MG capsule TAKE 1 CAPSULE BY MOUTH THREE TIMES A DAY. 90 capsule 2  . meclizine (ANTIVERT) 25 MG tablet Take 1 tablet (25 mg total) by mouth 3 (three) times daily as needed for dizziness. 30 tablet 0  . NONFORMULARY OR COMPOUNDED ITEM Diclofenac/Baclofen/Cyclobenzaprine/Gabapentin/Lidocaine 3/2/2/6/2.5% Cream  Apply to affected area 2-3 times per day as needed for arthritic pain. 1 each 1  . oxycodone (OXY-IR) 5 MG capsule Take 1 capsule (5 mg total) by mouth 2 (two) times daily as needed for pain. (Patient taking differently: Take 5 mg by mouth 2 (two) times daily as needed (severe pain). ) 60 capsule 0  . pantoprazole (PROTONIX) 40 MG tablet Take 1 tablet (40 mg total) by mouth 2 (two) times daily. 60 tablet 0  . temazepam (RESTORIL) 15 MG capsule Take 1 capsule (15 mg total) by mouth at bedtime. 30 capsule 1  . traMADol (ULTRAM) 50 MG tablet Take 1 tablet (50 mg total) by mouth every 8 (eight) hours as needed for moderate pain. 90 tablet 0  . valsartan-hydrochlorothiazide (DIOVAN-HCT) 320-25 MG tablet Take 0.5 tablets by mouth daily.    . vitamin B-12 1000 MCG tablet Take 1 tablet (1,000 mcg total) by mouth daily. 30 tablet 0  . metoprolol (LOPRESSOR) 50 MG tablet Take 50 mg by mouth 2 (two) times daily.    . ondansetron (ZOFRAN) 4 MG tablet Take 4 mg by mouth every 8 (eight) hours as needed for nausea or vomiting.    . simvastatin (ZOCOR) 20 MG tablet Take 20 mg by mouth at bedtime.    [name] tiZANidine (ZANAFLEX) 4 MG tablet Take 4 mg by mouth every 6 (six) hours as needed for muscle spasms.    [name] warfarin (COUMADIN) 5 MG tablet Take 5 mg by mouth daily at 6 PM.     No current facility-administered medications for this encounter.     REVIEW OF SYSTEMS:  A 15 point review of systems is documented in the electronic medical  record. This was obtained by the nursing staff. However, I reviewed this with the patient to discuss relevant findings and make appropriate changes.  Pertinent items are noted in HPI.   PHYSICAL EXAM:  height is 5\' 2"  (1.575 m) and weight is 133 lb 6.4 oz (60.51 kg). Her oral temperature is 97.7 F (36.5 C). Her blood pressure is 162/70 and her pulse is 58. Her respiration is 16 and oxygen saturation is 97%.   General: Well-developed, in no acute distress HEENT: Normocephalic, atraumatic; oral cavity clear Neck: Supple without any lymphadenopathy Cardiovascular: Regular rate and rhythm Respiratory: Clear to auscultation bilaterally GI: Pt had some mild tenderness to palpation along the right flank. Extremities: No edema present Neuro: No focal deficits  ECOG = 1  0 - Asymptomatic (Fully active, able to carry on all predisease activities without restriction)  1 - Symptomatic but completely ambulatory (Restricted in physically strenuous activity but ambulatory and able to carry out work of a light or sedentary nature.  For example, light housework, office work)  2 - Symptomatic, <50% in bed during the day (Ambulatory and capable of all self care but unable to carry out any work activities. Up and about more than 50% of waking hours)  3 - Symptomatic, >50% in bed, but not bedbound (Capable of only limited self-care, confined to bed or chair 50% or more of waking hours)  4 - Bedbound (Completely disabled. Cannot carry on any self-care. Totally confined to bed or chair)  5 - Death   Eustace Pen MM, Creech RH, Tormey DC, et al. [phone]). "Toxicity and response criteria of the [facility]". [place] Oncol. 5 (6): 649-55  LABORATORY DATA:  Lab Results  Component Value Date   WBC 4.9 05/14/2015   HGB 11.7* 05/14/2015   HCT 36.3 05/14/2015   MCV 79.8 05/14/2015   PLT 185.0 05/14/2015   Lab Results  Component Value Date   NA 140 05/14/2015   K 4.6 05/14/2015   CL 102  05/14/2015   CO2 33* 05/14/2015   Lab Results  Component Value Date   ALT 11 05/14/2015   AST 16 05/14/2015   ALKPHOS 77 05/14/2015   BILITOT 0.5 05/14/2015      RADIOGRAPHY: Ct Abdomen Pelvis Wo Contrast  05/05/2015  CLINICAL DATA:  Abdominal pain and lightheadedness.  Nausea. EXAM: CT ABDOMEN AND PELVIS WITHOUT CONTRAST TECHNIQUE: Multidetector CT imaging of the abdomen and pelvis was performed following the standard protocol without IV contrast. COMPARISON:  05/11/2014 FINDINGS: There is a large hiatal hernia. Stomach and small bowel are otherwise unremarkable. Prior appendectomy. Mild colonic diverticulosis. No evidence of diverticulitis or other acute inflammatory process. Unremarkable appearances of the kidneys, ureters and urinary bladder. Incidentally noted upper pole left renal cyst, unchanged. Adrenals are normal. Prior cholecystectomy. Moderate dilatation of the extrahepatic bile ducts, likely due to the cholecystectomy. Unremarkable unenhanced appearances of the liver, spleen, and pancreas. Incidentally noted benign calcified 1.7 cm lesion at the anterior periphery of the spleen, unchanged. Extensive atherosclerotic calcification of the aorta and iliac arteries. Normal caliber. Uterus and adnexal regions are unremarkable. No significant abnormality in the lower chest except for the large hiatal hernia. No significant musculoskeletal abnormality. Incidentally noted small fat containing umbilical hernia. IMPRESSION: 1. No acute findings are evident in the abdomen or pelvis. 2. Large hiatal hernia. 3. Post cholecystectomy expansion of the extrahepatic biliary system, unchanged. 4. Diverticulosis. Electronically Signed   By: Andreas Newport M.D.   On: 05/05/2015 00:58   Ct Chest Wo Contrast  05/07/2015  CLINICAL DATA:  New diagnosis of tumor at gastroesophageal junction. Large hiatal hernia. Esophageal cancer. EXAM: CT CHEST WITHOUT CONTRAST TECHNIQUE: Multidetector CT imaging of the chest  was performed following the standard protocol without IV contrast. COMPARISON:  02/14/2015 chest CT.  05/05/2015 abdominal pelvic CT. FINDINGS: Mediastinum/Nodes: No supraclavicular adenopathy. Aortic and branch vessel atherosclerosis. Moderate cardiomegaly with multivessel coronary artery atherosclerosis. Aortic valvular calcifications. Mitral annular calcifications. No mediastinal or definite hilar adenopathy, given limitations of unenhanced CT. A moderate hiatal hernia again identified. No esophageal obstruction or periesophageal adenopathy. Lungs/Pleura: Small right and trace left pleural effusions, slightly increased. Minimal motion degradation inferiorly.  Clear lungs. Upper abdomen: Cholecystectomy. Normal imaged portions of the liver, adrenal glands, kidneys, pancreas. A calcified anterior splenic lesion is again identified at 11 mm (image 49, series 2). No upper abdominal adenopathy identified. Abdominal aortic and branch vessel atherosclerosis. Musculoskeletal: Lower cervical spine fixation. Thoracic spondylosis. IMPRESSION: 1.  No acute process or evidence  of metastatic disease in the chest. 2. Minimal motion degradation inferiorly. 3. Slight increase in small right larger than left pleural effusions. 4. Moderate hiatal hernia. 5.  Atherosclerosis, including within the coronary arteries. 6. Aortic valvular calcifications which may represent valvular disease. Consider echocardiography. 7. Calcified splenic lesion is unchanged and likely related to remote infection or trauma. Electronically Signed   By: Abigail Miyamoto M.D.   On: 05/07/2015 17:02    IMPRESSION: Adenocarcinoma of the GE Junction. CT imaging was essentially negative. Pt at this time appears to have early stage disease. Endoscopic ultrasound pending to further stage the patient.  PLAN: She will meet with Dr. Benay Spice next Tuesday. She is scheduled for an endoscopic ultrasound next Thursday. The patient will be discussed in GI  multidisciplinary conference. The pt will also be referred to surgery. If the pt has confirmed early disease, then surgical resection alone may be appropriate. If locally advanced disease is present, then I recommend initial chemoradiation treatment.  ________________________________   Jodelle Gross, MD, PhD  This document serves as a record of services personally performed by Kyung Rudd, MD. It was created on his behalf by Darcus Austin, a trained medical scribe. The creation of this record is based on the scribe's personal observations and the provider's statements to them. This document has been checked and approved by the attending provider.

## 2015-05-12 NOTE — Progress Notes (Signed)
Please see the Nurse Progress Note in the MD Initial Consult Encounter for this patient. 

## 2015-05-13 ENCOUNTER — Other Ambulatory Visit: Payer: Self-pay

## 2015-05-13 ENCOUNTER — Telehealth: Payer: Self-pay

## 2015-05-13 DIAGNOSIS — D509 Iron deficiency anemia, unspecified: Secondary | ICD-10-CM | POA: Diagnosis not present

## 2015-05-13 DIAGNOSIS — I110 Hypertensive heart disease with heart failure: Secondary | ICD-10-CM | POA: Diagnosis not present

## 2015-05-13 DIAGNOSIS — R262 Difficulty in walking, not elsewhere classified: Secondary | ICD-10-CM | POA: Diagnosis not present

## 2015-05-13 DIAGNOSIS — K5790 Diverticulosis of intestine, part unspecified, without perforation or abscess without bleeding: Secondary | ICD-10-CM | POA: Diagnosis not present

## 2015-05-13 DIAGNOSIS — C801 Malignant (primary) neoplasm, unspecified: Secondary | ICD-10-CM

## 2015-05-13 DIAGNOSIS — C169 Malignant neoplasm of stomach, unspecified: Secondary | ICD-10-CM | POA: Diagnosis not present

## 2015-05-13 DIAGNOSIS — K449 Diaphragmatic hernia without obstruction or gangrene: Secondary | ICD-10-CM | POA: Diagnosis not present

## 2015-05-13 NOTE — Telephone Encounter (Signed)
EUS scheduled, pt instructed and medications reviewed.  Patient instructions mailed to home.  Patient to call with any questions or concerns.  

## 2015-05-13 NOTE — Telephone Encounter (Signed)
Notes Recorded by Barron Alvine, CMA on 05/11/2015 at 3:47 PM Pt has been scheduled for EUS on 05/20/15 instructions have been mailed to the pt and message left for pt to return call to discuss over the phone and answer any questions. ------  Notes Recorded by [name], MD on 05/07/2015 at 3:27 PM Ulice Dash, thanks for the message. I looked at your report, images, CT. Does seem pretty small and hopefully curable with surgery. We'll get her scheduled for EUS staging.  Sephiroth Mcluckie, She needs upper EUS, radial +/- linear, +MAC, next available EUS Thursday for newly diagnosed GE junction adenocarcinoma.   thanks ------  Notes Recorded by Algernon Huxley, RN on 05/07/2015 at 2:07 PM Noted ------  Notes Recorded by [name], MD on 05/07/2015 at 1:57 PM Pathology results discussed with patient while hospitalized In person discussion in lieu of pathology letter

## 2015-05-14 ENCOUNTER — Other Ambulatory Visit (INDEPENDENT_AMBULATORY_CARE_PROVIDER_SITE_OTHER): Payer: Medicare Other

## 2015-05-14 ENCOUNTER — Ambulatory Visit (INDEPENDENT_AMBULATORY_CARE_PROVIDER_SITE_OTHER): Payer: Medicare Other | Admitting: Family

## 2015-05-14 ENCOUNTER — Encounter (HOSPITAL_COMMUNITY): Payer: Self-pay | Admitting: *Deleted

## 2015-05-14 ENCOUNTER — Encounter: Payer: Self-pay | Admitting: Family

## 2015-05-14 ENCOUNTER — Encounter: Payer: Self-pay | Admitting: Radiation Oncology

## 2015-05-14 VITALS — BP 130/82 | HR 54 | Temp 98.0°F | Resp 18 | Ht 62.0 in | Wt 133.0 lb

## 2015-05-14 DIAGNOSIS — I5032 Chronic diastolic (congestive) heart failure: Secondary | ICD-10-CM | POA: Diagnosis not present

## 2015-05-14 DIAGNOSIS — I10 Essential (primary) hypertension: Secondary | ICD-10-CM | POA: Diagnosis not present

## 2015-05-14 DIAGNOSIS — D509 Iron deficiency anemia, unspecified: Secondary | ICD-10-CM

## 2015-05-14 DIAGNOSIS — C159 Malignant neoplasm of esophagus, unspecified: Secondary | ICD-10-CM

## 2015-05-14 DIAGNOSIS — I480 Paroxysmal atrial fibrillation: Secondary | ICD-10-CM

## 2015-05-14 DIAGNOSIS — C160 Malignant neoplasm of cardia: Secondary | ICD-10-CM | POA: Insufficient documentation

## 2015-05-14 LAB — COMPREHENSIVE METABOLIC PANEL
ALT: 11 U/L (ref 0–35)
AST: 16 U/L (ref 0–37)
Albumin: 4.1 g/dL (ref 3.5–5.2)
Alkaline Phosphatase: 77 U/L (ref 39–117)
BILIRUBIN TOTAL: 0.5 mg/dL (ref 0.2–1.2)
BUN: 18 mg/dL (ref 6–23)
CO2: 33 meq/L — AB (ref 19–32)
Calcium: 9.4 mg/dL (ref 8.4–10.5)
Chloride: 102 mEq/L (ref 96–112)
Creatinine, Ser: 1.01 mg/dL (ref 0.40–1.20)
GFR: 56.68 mL/min — AB (ref >60.00)
GLUCOSE: 127 mg/dL — AB (ref 70–99)
Potassium: 4.6 mEq/L (ref 3.5–5.1)
SODIUM: 140 meq/L (ref 135–145)
Total Protein: 6.9 g/dL (ref 6.0–8.3)

## 2015-05-14 LAB — CBC
HEMATOCRIT: 36.3 % (ref 36.0–46.0)
Hemoglobin: 11.7 g/dL — ABNORMAL LOW (ref 12.0–15.0)
MCHC: 32.3 g/dL (ref 30.0–36.0)
MCV: 79.8 fl (ref 78.0–100.0)
Platelets: 185 10*3/uL (ref 150.0–400.0)
RBC: 4.54 Mil/uL (ref 3.87–5.11)
RDW: 24.7 % — AB (ref 11.5–15.5)
WBC: 4.9 10*3/uL (ref 4.0–10.5)

## 2015-05-14 LAB — IBC PANEL
IRON: 109 ug/dL (ref 42–145)
Saturation Ratios: 32.4 % (ref 20.0–50.0)
TRANSFERRIN: 240 mg/dL (ref 212.0–360.0)

## 2015-05-14 LAB — FERRITIN: FERRITIN: 168.7 ng/mL (ref 10.0–291.0)

## 2015-05-14 NOTE — Assessment & Plan Note (Signed)
Appears to be stable follow PRBC and iron infusion. Obtain CBC, IBC panel, and ferratin. Did not receive second iron infusion and will send message to hematology for further recommendation and determination of need of another iron infusion. Follow up pending blood work results.

## 2015-05-14 NOTE — Assessment & Plan Note (Signed)
Hypertension remains well controlled with blood pressure lower than goal 140/90. Continue to monitor blood pressure at home. Continue current dosage of amlodipine, clonidine, furosemide, metoprolol, and valsartan-hydrochlorothiazide.

## 2015-05-14 NOTE — Patient Instructions (Signed)
Thank you for choosing [facility].  Summary/Instructions:  Your prescription(s) have been submitted to your pharmacy or been printed and provided for you. Please take as directed and contact our office if you believe you are having problem(s) with the medication(s) or have any questions.  Please stop by the lab on the basement level of the building for your blood work. Your results will be released to MyChart (or called to you) after review, usually within 72 hours after test completion. If any changes need to be made, you will be notified at that same time.  If your symptoms worsen or fail to improve, please contact our office for further instruction, or in case of emergency go directly to the emergency room at the closest medical facility.     

## 2015-05-14 NOTE — Assessment & Plan Note (Signed)
Stable at present with treatment plan pending upcoming Endoscopic ultrasound and team meeting. Treatment per Dr. Learta Codding.

## 2015-05-14 NOTE — Progress Notes (Signed)
Subjective:    Patient ID: [name], female    DOB: 09-15-1940, 74 y.o.   MRN: [id number]  Chief Complaint  Patient presents with  . Hospitalization Follow-up    HPI:  [name] is a 74 y.o. female who  has a past medical history of CHF (congestive heart failure) ([place]); Hypertension; Broken back (2013); Atrial fibrillation ([place]); GERD (gastroesophageal reflux disease); Arthritis; Stroke ([place]); Pulmonary emboli ([place]) (2008, 2012); Anemia (2005); HH (hiatus hernia) (2008); Allergy; Esophageal cancer ([place]) (05/06/15); PONV (postoperative nausea and vomiting); Transfusion history; and H/O iron deficiency. and presents today for an office visit following hospitalization.   Recently evaluated in the ED and admitted to the hospital following 2-3 days of nausea, diarrhea and diffuse abdominal pain which was unresolved with previously prescribed Reglan. UA was unremarkable and CBC with hemoglobin of 8.8 and a positive FOBT. She was within therapeutic range of her coumadin which was held. CT results with no acute findings, a large hiatal hernia, and diverticulosis.  Her lipase and LFT were normal leaving potential for viral or bacterial gastroenteritis. C. Diff was also negative. Endoscopy revealed large hiatal hernia with non-bleeding Cameron erosions. There was also pathology that was found to be an adenocarcinoma. Treatment is being scheduled with Dr. Benay Spice of the [facility] and will undergo an upper endoscopic ultrasound with Dr. Ardis Hughs on 11/17. She was discharged with one additional iron infusion which has yet to be completed and home health physical therapy. All ED and hospital records were reviewed in detail.  Since leaving the hospital she continues to experience the associated symptom of nausea and loose stools however denies any blood in her stool or vomiting. She continues to work with physical therapy to help to increase her strengh. Able to eat and working on  returning to baseline function.   1.) Iron deficiency - Received IV iron infusion and was scheduled to receive one additional iron infusion on 11/7, but has not heard anything about the procedure. Denies any adverse symptoms at this time.   2.) Adenocarcinoma - Currently awaiting Endoscopic [place] and determination of treatment plan to discuss the potential for surgery versus radiation/chemotherpy. The cancer team is meeting on 11/15 to discuss her case. She denies any symptoms of bleeding, vomiting, or difficulty swallowing.   Allergies  Allergen Reactions  . Iodinated Diagnostic Agents Anaphylaxis    Info given by patient  . Milk-Related Compounds     Lactose intolerance  . Darvon [Propoxyphene] Rash  . Hydralazine Anxiety    Facial flushing, pt prefers not to use it.      Current Outpatient Prescriptions on File Prior to Visit  Medication Sig Dispense Refill  . amLODipine (NORVASC) 10 MG tablet Take 1 tablet (10 mg total) by mouth daily. 90 tablet 3  . cloNIDine (CATAPRES) 0.1 MG tablet Take 1 tablet (0.1 mg total) by mouth daily as needed (high blood pressure, will take if levels are 180/90). (Patient taking differently: Take 0.1 mg by mouth daily as needed (high blood pressure, take if blood pressure is >179/99.). ) 30 tablet 0  . furosemide (LASIX) 40 MG tablet TAKE 1 TO 2 TABLETS BY MOUTH ONCE DAILY. (Patient taking differently: TAKE 1 TO 2 TABLETS BY MOUTH ONCE DAILY. Takes 2 tablets if foot swelling is bad.) 30 tablet 0  . gabapentin (NEURONTIN) 100 MG capsule TAKE 1 CAPSULE BY MOUTH THREE TIMES A DAY. 90 capsule 2  . meclizine (ANTIVERT) 25 MG tablet Take 1 tablet (  25 mg total) by mouth 3 (three) times daily as needed for dizziness. 30 tablet 0  . metoprolol (LOPRESSOR) 50 MG tablet Take 50 mg by mouth 2 (two) times daily.    . NONFORMULARY OR COMPOUNDED ITEM Diclofenac/Baclofen/Cyclobenzaprine/Gabapentin/Lidocaine 3/2/2/6/2.5% Cream  Apply to affected area 2-3 times per day as  needed for arthritic pain. 1 each 1  . ondansetron (ZOFRAN) 4 MG tablet Take 4 mg by mouth every 8 (eight) hours as needed for nausea or vomiting.    [name] oxycodone (OXY-IR) 5 MG capsule Take 1 capsule (5 mg total) by mouth 2 (two) times daily as needed for pain. (Patient taking differently: Take 5 mg by mouth 2 (two) times daily as needed (severe pain). ) 60 capsule 0  . pantoprazole (PROTONIX) 40 MG tablet Take 1 tablet (40 mg total) by mouth 2 (two) times daily. 60 tablet 0  . simvastatin (ZOCOR) 20 MG tablet Take 20 mg by mouth at bedtime.    . temazepam (RESTORIL) 15 MG capsule Take 1 capsule (15 mg total) by mouth at bedtime. 30 capsule 1  . tiZANidine (ZANAFLEX) 4 MG tablet Take 4 mg by mouth every 6 (six) hours as needed for muscle spasms.    . traMADol (ULTRAM) 50 MG tablet Take 1 tablet (50 mg total) by mouth every 8 (eight) hours as needed for moderate pain. 90 tablet 0  . valsartan-hydrochlorothiazide (DIOVAN-HCT) 320-25 MG tablet Take 0.5 tablets by mouth daily.    . vitamin B-12 1000 MCG tablet Take 1 tablet (1,000 mcg total) by mouth daily. 30 tablet 0  . warfarin (COUMADIN) 5 MG tablet Take 5 mg by mouth daily at 6 PM.     No current facility-administered medications on file prior to visit.     Past Surgical History  Procedure Laterality Date  . Lumbar disc surgery  1991  . Lumbar back surgery  2012  . Cervical disc surgery  2014  . Cholecystectomy  1980s    open  . Appendectomy  1950s  . Tonsillectomy and adenoidectomy  1960s  . Carpal tunnel release Bilateral 1990s  . Esophagogastroduodenoscopy N/A 02/16/2015    Procedure: ESOPHAGOGASTRODUODENOSCOPY (EGD);  Surgeon: [name], MD;  Location: [facility];  Service: Endoscopy;  Laterality: N/A;  . Colonoscopy N/A 02/17/2015    Procedure: COLONOSCOPY;  Surgeon: [name], MD;  Location: [facility] ENDOSCOPY;  Service: Endoscopy;  Laterality: N/A;  . Exploratory lab  1950s or 1960s  . Esophagogastroduodenoscopy N/A  05/06/2015    Procedure: ESOPHAGOGASTRODUODENOSCOPY (EGD);  Surgeon: [name], MD;  Location: [facility] ENDOSCOPY;  Service: Endoscopy;  Laterality: N/A;  . Givens capsule study N/A 05/06/2015    Procedure: GIVENS CAPSULE STUDY;  Surgeon: [name], MD;  Location: [facility] ENDOSCOPY;  Service: Endoscopy;  Laterality: N/A;    Past Medical History  Diagnosis Date  . CHF (congestive heart failure) ([place])   . Hypertension   . Broken back 2013    chronic back pain.   . Atrial fibrillation ([place])   . GERD (gastroesophageal reflux disease)   . Arthritis   . Stroke ([place])   . Pulmonary emboli ([place]) 2008, 2012  . Anemia 2005    generally microcytic, transfusions in [address], 2012, 02/2015, 05/2015  . HH (hiatus hernia) 2008    large with associated erosions.   . Allergy   . Esophageal cancer ([place]) 05/06/15    adenocarcinoma ge junction  . PONV (postoperative nausea and vomiting)   . Transfusion history  2 units transfused 05-06-15([facility])  . H/O iron deficiency     05-06-15 iron infusion ([facility])     Review of Systems  Constitutional: Negative for fever and chills.  Respiratory: Negative for chest tightness and shortness of breath.   Cardiovascular: Negative for chest pain, palpitations and leg swelling.  Gastrointestinal: Positive for nausea and diarrhea. Negative for vomiting, abdominal pain, blood in stool and rectal pain.  Neurological: Positive for weakness. Negative for light-headedness, numbness and headaches.      Objective:    BP 130/82 mmHg  Pulse 54  Temp(Src) 98 F (36.7 C) (Oral)  Resp 18  Ht 5\' 2"  (1.575 m)  Wt 133 lb (60.328 kg)  BMI 24.32 kg/m2  SpO2 97% Nursing note and vital signs reviewed.  Physical Exam  Constitutional: She is oriented to person, place, and time. She appears well-developed and well-nourished. No distress.  Cardiovascular: Normal rate, normal heart sounds and intact distal pulses.  An irregular rhythm present.  Pulmonary/Chest: Effort normal and breath  sounds normal.  Abdominal: Soft. Normal appearance and bowel sounds are normal. She exhibits no mass. There is no hepatosplenomegaly. There is no tenderness. There is no rigidity, no rebound, no guarding, no tenderness at McBurney's point and negative Murphy's sign.  Neurological: She is alert and oriented to person, place, and time.  Skin: Skin is warm and dry.  Psychiatric: She has a normal mood and affect. Her behavior is normal. Judgment and thought content normal.       Assessment & Plan:   Problem List Items Addressed This Visit      Cardiovascular and Mediastinum   PAF (paroxysmal atrial fibrillation) ([name])    Paroxysmal atrial fibrillation maintained with anticoagulation using warfarin. There have been several periods recently with warfarin holding secondary to procedures and/or potential gastrointestinal bleeding. Continue current dosage of warfarin per last Coumadin visit and hold 5 days prior to endoscopic ultrasound.      Chronic diastolic (congestive) heart failure ([name])    Stable and treatment through management of risk factor hypertension and hyperlipidemia. Maintained on beta blocker for decreased risk of sudden cardiac death; furosemide for volume/edema control; and simvastatin for cholesterol risk reduction and valsartan-HCTZ for kidney/heart protection.       Hypertension    Hypertension remains well controlled with blood pressure lower than goal 140/90. Continue to monitor blood pressure at home. Continue current dosage of amlodipine, clonidine, furosemide, metoprolol, and valsartan-hydrochlorothiazide.        Digestive   Esophageal adenocarcinoma ([place])    Stable at present with treatment plan pending upcoming Endoscopic ultrasound and team meeting. Treatment per Dr. Learta Codding.         Other   IDA (iron deficiency anemia) - Primary    Appears to be stable follow PRBC and iron infusion. Obtain CBC, IBC panel, and ferratin. Did not receive second iron infusion and  will send message to hematology for further recommendation and determination of need of another iron infusion. Follow up pending blood work results.       Relevant Orders   CBC (Completed)   IBC panel (Completed)   Ferritin (Completed)   Comprehensive metabolic panel (Completed)

## 2015-05-14 NOTE — Assessment & Plan Note (Signed)
Paroxysmal atrial fibrillation maintained with anticoagulation using warfarin. There have been several periods recently with warfarin holding secondary to procedures and/or potential gastrointestinal bleeding. Continue current dosage of warfarin per last Coumadin visit and hold 5 days prior to endoscopic ultrasound.

## 2015-05-14 NOTE — Progress Notes (Signed)
Pre visit review using our clinic review tool, if applicable. No additional management support is needed unless otherwise documented below in the visit note. 

## 2015-05-14 NOTE — Assessment & Plan Note (Signed)
Stable and treatment through management of risk factor hypertension and hyperlipidemia. Maintained on beta blocker for decreased risk of sudden cardiac death; furosemide for volume/edema control; and simvastatin for cholesterol risk reduction and valsartan-HCTZ for kidney/heart protection.

## 2015-05-17 ENCOUNTER — Encounter: Payer: Self-pay | Admitting: Family

## 2015-05-18 ENCOUNTER — Encounter: Payer: Self-pay | Admitting: *Deleted

## 2015-05-18 ENCOUNTER — Telehealth: Payer: Self-pay | Admitting: *Deleted

## 2015-05-18 ENCOUNTER — Telehealth: Payer: Self-pay | Admitting: Oncology

## 2015-05-18 ENCOUNTER — Ambulatory Visit (HOSPITAL_BASED_OUTPATIENT_CLINIC_OR_DEPARTMENT_OTHER): Payer: Medicare Other | Admitting: Oncology

## 2015-05-18 ENCOUNTER — Encounter: Payer: Self-pay | Admitting: Oncology

## 2015-05-18 VITALS — BP 175/43 | HR 63 | Temp 98.3°F | Resp 18 | Ht 62.0 in | Wt 134.2 lb

## 2015-05-18 DIAGNOSIS — Z8673 Personal history of transient ischemic attack (TIA), and cerebral infarction without residual deficits: Secondary | ICD-10-CM

## 2015-05-18 DIAGNOSIS — C160 Malignant neoplasm of cardia: Secondary | ICD-10-CM

## 2015-05-18 DIAGNOSIS — Z8041 Family history of malignant neoplasm of ovary: Secondary | ICD-10-CM

## 2015-05-18 DIAGNOSIS — Z823 Family history of stroke: Secondary | ICD-10-CM

## 2015-05-18 DIAGNOSIS — K449 Diaphragmatic hernia without obstruction or gangrene: Secondary | ICD-10-CM | POA: Diagnosis not present

## 2015-05-18 DIAGNOSIS — Z86711 Personal history of pulmonary embolism: Secondary | ICD-10-CM

## 2015-05-18 DIAGNOSIS — I4891 Unspecified atrial fibrillation: Secondary | ICD-10-CM | POA: Diagnosis not present

## 2015-05-18 DIAGNOSIS — D509 Iron deficiency anemia, unspecified: Secondary | ICD-10-CM | POA: Diagnosis not present

## 2015-05-18 NOTE — Telephone Encounter (Signed)
Per staff phone call and POF I have schedueld appts. Scheduler advised of appts.  JMW  

## 2015-05-18 NOTE — Progress Notes (Signed)
Oncology Nurse Navigator Documentation  Oncology Nurse Navigator Flowsheets 05/18/2015  Referral date to RadOnc/MedOnc -  Navigator Encounter Type Initial MedOnc  Patient Visit Type Medonc  Treatment Phase Treatment  Barriers/Navigation Needs Family concerns;Education  Education Understanding Cancer/ Treatment Options;Newly Diagnosed Cancer Education;Preparing for Upcoming Surgery/ Treatment  Interventions Education Method;Referrals  Referrals Nutrition/dietician  Education Method Verbal;Written;Teach-back  Support Groups/Services ACS-Personal Health Manager Kit;GI  Time Spent with Patient 69  Met with patient and sister, [name] during new patient visit. Explained the role of the GI Nurse Navigator and provided New Patient Packet with information on: 1. Gastric cancer 2. Support groups 3. Advanced Directives 4. Fall Safety Plan Answered questions, reviewed current treatment plan using TEACH back and provided emotional support. Provided copy of current treatment plan. Sister reports she is available for all her transportation needs to treatments and patient lives with her son. Per MD, instructed her to add Maalox or Mylanta prn for her heartburn and reflux. Encouraged her to eat soft diet. Goal is to start chemo and RT by 06/01/15. She saw Dr. Lisbeth Renshaw on 05/12/15 and sees Dr. Servando Snare on 05/26/15.  Merceda Elks, RN, BSN GI Oncology [place]

## 2015-05-18 NOTE — Telephone Encounter (Signed)
Gave and printed appt sched adn avs for pt for NOV and DEC °

## 2015-05-18 NOTE — Progress Notes (Signed)
[name] Patient Consult   Referring MD: Zenovia Jarred   [name] 74 y.o.  1940/12/07    Reason for Referral: Gastroesophageal carcinoma   HPI: She was admitted in August with dyspnea and found to have severe anemia. She reported melena. She was transfused with packed red blood cells. She underwent an upper endoscopy on 02/16/2015 that revealed a stricture at the GE junction and a hiatal hernia. No source for bleeding was identified. A colonoscopy on 02/17/2015 revealed mild diverticulosis. A pedunculated polyp in the descending colon was removed. This was felt to potentially be the source of blood loss. The pathology revealed a benign colon polyp. She had been maintained on anticoagulation prior to hospital admission. She was readmitted on 05/05/2015 with nausea/vomiting and abdominal pain. She had severe microcytic anemia. She was transfused with packed red blood cells. The PT/INR was therapeutic on hospital admission.  Gastroenterology was consulted and she was taken to a repeat upper endoscopy on 05/06/2015. A sessile 1.2 cm polypoid lesion was found at the cardia on the stomach side of the GE junction. This was biopsied. There was a large hiatal hernia. Cameron's erosions were found, not bleeding. The biopsy from the stomach mass revealed adenocarcinoma.  Dr. Marin Olp was consulted. The lesion was not seen on CT. An endoscopic ultrasound has been scheduled. She is referred to the [place] office secondary to proximity to her home. She has been seen by Dr. Lisbeth Renshaw.  Ms. Ureste has discontinued anticoagulation therapy. She was last transfused with packed red blood cells on 05/05/2015. She cannot tolerate oral iron therapy.     Past Medical History  Diagnosis Date  . CHF (congestive heart failure) ([place])   . Hypertension   . Broken back 2013    chronic back pain.   . Atrial fibrillation ([place])   . GERD (gastroesophageal reflux disease)   . Arthritis   .  Stroke ([place])   . Pulmonary emboli ([place]) 2008, 2012  . Anemia 2005    generally microcytic, transfusions in 200[address], 2012, 02/2015, 05/2015  . HH (hiatus hernia) 2008    large with associated erosions.   . Allergy   . Esophageal cancer ([place]) 05/06/15    adenocarcinoma ge junction  . PONV (postoperative nausea and vomiting)   . Transfusion history      packed red blood cells and FFP August 2016, packed red blood cells November 2016   . H/O iron deficiency     05-06-15 iron infusion ([facility])    .   G4 P4  Past Surgical History  Procedure Laterality Date  . Lumbar disc surgery  1991  . Lumbar back surgery  2012  . Cervical disc surgery  2014  . Cholecystectomy  1980s    open  . Appendectomy  1950s  . Tonsillectomy and adenoidectomy  1960s  . Carpal tunnel release Bilateral 1990s  . Esophagogastroduodenoscopy N/A 02/16/2015    Procedure: ESOPHAGOGASTRODUODENOSCOPY (EGD);  Surgeon: Miranda Castle, MD;  Location: [facility];  Service: Endoscopy;  Laterality: N/A;  . Colonoscopy N/A 02/17/2015    Procedure: COLONOSCOPY;  Surgeon: Miranda Castle, MD;  Location: [facility] ENDOSCOPY;  Service: Endoscopy;  Laterality: N/A;  . Exploratory lab  1950s or 1960s  . Esophagogastroduodenoscopy N/A 05/06/2015    Procedure: ESOPHAGOGASTRODUODENOSCOPY (EGD);  Surgeon: Miranda Bears, MD;  Location[facility] ENDOSCOPY;  Service: Endoscopy;  Laterality: N/A;  . Givens capsule study N/A 05/06/2015    Procedure: GIVENS CAPSULE STUDY;  Surgeon: Miranda Lines  Pyrtle, MD;  Location: [place];  Service: Endoscopy;  Laterality: N/A;    Medications: Reviewed  Allergies:  Allergies  Allergen Reactions  . Iodinated Diagnostic Agents Anaphylaxis    Info given by patient  . Milk-Related Compounds     Lactose intolerance  . Darvon [Propoxyphene] Rash  . Hydralazine Anxiety    Facial flushing, pt prefers not to use it.     Family history: A sister died of ovarian cancer at age 56. Another sister died at age 47 of a CVA. No other  family history of cancer.  Social History:   She lives in [place] with her son. She does not use tobacco or alcohol. She has worked in Hospital doctor, as a Agricultural consultant Asst., and with Production designer, theatre/television/film in the lab. She has been transfused with red blood cells on multiple occasions.  ROS:   Positives include: Anorexia, dysphagia, loose bowel movements, occasional angina, decreased visual acuity, severe occipital headache one week ago, chronic back pain and hand arthritis  A complete ROS was otherwise negative.  Physical Exam:  Blood pressure 175/43, pulse 63, temperature 98.3 F (36.8 C), temperature source Oral, resp. rate 18, height 5\' 2"  (1.575 m), weight 134 lb 3.2 oz (60.873 kg), SpO2 99 %.  HEENT: Multiple missing teeth, oropharynx without visible mass, neck without mass Lungs: Clear bilaterally Cardiac: Regular rate and rhythm, 2/6 systolic murmur Abdomen: No hepatosplenomegaly, mild tenderness in the mid upper abdomen, no mass, no apparent ascites  Vascular: No leg edema Lymph nodes: No cervical, supra-clavicular, axillary, or inguinal nodes Neurologic: Alert and oriented, the motor exam appears intact in the upper and lower extremities Skin: No rash Musculoskeletal: Tenderness at the lower spine   LAB:  CBC  Lab Results  Component Value Date   WBC 4.9 05/14/2015   HGB 11.7* 05/14/2015   HCT 36.3 05/14/2015   MCV 79.8 05/14/2015   PLT 185.0 05/14/2015   NEUTROABS 2.8 03/09/2015     CMP      Component Value Date/Time   NA 140 05/14/2015 1217   K 4.6 05/14/2015 1217   CL 102 05/14/2015 1217   CO2 33* 05/14/2015 1217   GLUCOSE 127* 05/14/2015 1217   BUN 18 05/14/2015 1217   CREATININE 1.01 05/14/2015 1217   CALCIUM 9.4 05/14/2015 1217   PROT 6.9 05/14/2015 1217   ALBUMIN 4.1 05/14/2015 1217   AST 16 05/14/2015 1217   ALT 11 05/14/2015 1217   ALKPHOS 77 05/14/2015 1217   BILITOT 0.5 05/14/2015 1217   GFRNONAA >60 05/08/2015 0318   GFRAA >60  05/08/2015 0318    Lab Results  Component Value Date   CEA <0.5 12/03/2006    Imaging:  CT of the abdomen and pelvis 05/05/2015-large hiatal hernia, no acute findings CT chest 05/07/2015-moderate hiatal hernia, no esophageal obstruction or periesophageal adenopathy, clear lungs, small pleural effusions  Assessment/Plan:   1. Adenocarcinoma of the gastric cardia/GE junction  Staging CT scans November 2016 with no tumor seen and no evidence of metastatic disease  2. Iron deficiency anemia, status post IV iron 05/06/2015, packed red blood cells 05/05/2015  3.   History of atrial fibrillation  4.   History of congestive heart failure  5.   History of a CVA  6.   History of pulmonary embolism, currently maintained off of anticoagulation  7.   Hiatal hernia   Disposition:   Ms. Carollo has been diagnosed with adenocarcinoma of the gastric cardia near the GE junction. She appears to  have early stage disease based on the staging evaluation to date. She is scheduled for an endoscopic ultrasound this week.  She has been evaluated by Dr. Lisbeth Renshaw and is scheduled to see Dr. Servando Snare. If she has a locally advanced lesion on the EUS then she will be a candidate for concurrent chemotherapy and radiation. She may also be a candidate for chemotherapy and radiation if she has an early T stage lesion and is not a surgical candidate.  I discussed weekly Taxol/carboplatin with Ms. Chronis and her sister. We reviewed the potential toxicities associated with this regimen including the chance for nausea/vomiting, alopecia, mucositis, diarrhea, and hematologic toxicity. We discussed the allergic reaction associated with Taxol and carboplatin. We discussed the neuropathy and bone pain seen with Taxol. She agrees to proceed with chemotherapy if this is the recommended treatment plan. She will be scheduled for a chemotherapy teaching class.  Ms. Kelsall has been scheduled to start weekly chemotherapy  06/01/2015 pending the EUS evaluation later this week and consultation with Dr. Servando Snare.  The iron deficiency anemia is most likely related to the hiatal hernia while on anticoagulation therapy. We will monitor the hemoglobin and repeat IV iron therapy as needed.  She is currently maintained off of anticoagulation therapy with the recent GI bleeding. We will need to consider resuming anticoagulation in the future pending review of the pulmonary embolism history.  Approximately 50 minutes were spent with the patient today. The majority of the time was used for counseling and coordination of care.  [place], [place] 05/18/2015, 5:46 PM

## 2015-05-19 DIAGNOSIS — I110 Hypertensive heart disease with heart failure: Secondary | ICD-10-CM | POA: Diagnosis not present

## 2015-05-19 DIAGNOSIS — K5790 Diverticulosis of intestine, part unspecified, without perforation or abscess without bleeding: Secondary | ICD-10-CM | POA: Diagnosis not present

## 2015-05-19 DIAGNOSIS — R262 Difficulty in walking, not elsewhere classified: Secondary | ICD-10-CM | POA: Diagnosis not present

## 2015-05-19 DIAGNOSIS — C169 Malignant neoplasm of stomach, unspecified: Secondary | ICD-10-CM | POA: Diagnosis not present

## 2015-05-19 DIAGNOSIS — K449 Diaphragmatic hernia without obstruction or gangrene: Secondary | ICD-10-CM | POA: Diagnosis not present

## 2015-05-19 DIAGNOSIS — D509 Iron deficiency anemia, unspecified: Secondary | ICD-10-CM | POA: Diagnosis not present

## 2015-05-20 ENCOUNTER — Ambulatory Visit (HOSPITAL_COMMUNITY): Payer: Medicare Other | Admitting: Anesthesiology

## 2015-05-20 ENCOUNTER — Encounter (HOSPITAL_COMMUNITY): Payer: Self-pay

## 2015-05-20 ENCOUNTER — Ambulatory Visit (HOSPITAL_COMMUNITY)
Admission: RE | Admit: 2015-05-20 | Discharge: 2015-05-20 | Disposition: A | Discharge status: Home / Self Care | Payer: Medicare Other | Source: Ambulatory Visit | Attending: Gastroenterology | Admitting: Gastroenterology

## 2015-05-20 ENCOUNTER — Encounter (HOSPITAL_COMMUNITY)
Admission: RE | Disposition: A | Discharge status: Home / Self Care | Payer: Self-pay | Source: Ambulatory Visit | Attending: Gastroenterology

## 2015-05-20 DIAGNOSIS — I110 Hypertensive heart disease with heart failure: Secondary | ICD-10-CM | POA: Diagnosis not present

## 2015-05-20 DIAGNOSIS — Z8711 Personal history of peptic ulcer disease: Secondary | ICD-10-CM | POA: Insufficient documentation

## 2015-05-20 DIAGNOSIS — C160 Malignant neoplasm of cardia: Secondary | ICD-10-CM | POA: Diagnosis not present

## 2015-05-20 DIAGNOSIS — I509 Heart failure, unspecified: Secondary | ICD-10-CM | POA: Diagnosis not present

## 2015-05-20 DIAGNOSIS — K449 Diaphragmatic hernia without obstruction or gangrene: Secondary | ICD-10-CM | POA: Insufficient documentation

## 2015-05-20 DIAGNOSIS — I10 Essential (primary) hypertension: Secondary | ICD-10-CM | POA: Diagnosis not present

## 2015-05-20 DIAGNOSIS — Z8673 Personal history of transient ischemic attack (TIA), and cerebral infarction without residual deficits: Secondary | ICD-10-CM | POA: Insufficient documentation

## 2015-05-20 DIAGNOSIS — K219 Gastro-esophageal reflux disease without esophagitis: Secondary | ICD-10-CM | POA: Insufficient documentation

## 2015-05-20 DIAGNOSIS — M1990 Unspecified osteoarthritis, unspecified site: Secondary | ICD-10-CM | POA: Diagnosis not present

## 2015-05-20 DIAGNOSIS — C801 Malignant (primary) neoplasm, unspecified: Secondary | ICD-10-CM

## 2015-05-20 DIAGNOSIS — D509 Iron deficiency anemia, unspecified: Secondary | ICD-10-CM | POA: Diagnosis not present

## 2015-05-20 DIAGNOSIS — Z86711 Personal history of pulmonary embolism: Secondary | ICD-10-CM | POA: Diagnosis not present

## 2015-05-20 DIAGNOSIS — I4891 Unspecified atrial fibrillation: Secondary | ICD-10-CM | POA: Insufficient documentation

## 2015-05-20 HISTORY — PX: EUS: SHX5427

## 2015-05-20 HISTORY — DX: Other specified postprocedural states: Z98.890

## 2015-05-20 HISTORY — DX: Personal history of other endocrine, nutritional and metabolic disease: Z86.39

## 2015-05-20 HISTORY — DX: Other specified postprocedural states: R11.2

## 2015-05-20 SURGERY — UPPER ENDOSCOPIC ULTRASOUND (EUS) LINEAR
Anesthesia: Monitor Anesthesia Care

## 2015-05-20 MED ORDER — PROPOFOL 500 MG/50ML IV EMUL
INTRAVENOUS | Status: DC | PRN
  Administered 2015-05-20: 100 ug/kg/min via INTRAVENOUS

## 2015-05-20 MED ORDER — LIDOCAINE HCL (PF) 2 % IJ SOLN
INTRAMUSCULAR | Status: DC | PRN
  Administered 2015-05-20 (×2): 50 mg via INTRADERMAL

## 2015-05-20 MED ORDER — PROPOFOL 10 MG/ML IV BOLUS
INTRAVENOUS | Status: DC | PRN
  Administered 2015-05-20 (×3): 20 mg via INTRAVENOUS

## 2015-05-20 MED ORDER — SODIUM CHLORIDE 0.9 % IV SOLN: INTRAVENOUS | Status: DC

## 2015-05-20 MED ORDER — LIDOCAINE HCL (CARDIAC) 20 MG/ML IV SOLN
INTRAVENOUS | Status: AC
  Filled 2015-05-20: qty 5

## 2015-05-20 MED ORDER — LACTATED RINGERS IV SOLN
INTRAVENOUS | Status: DC | PRN
  Administered 2015-05-20: 09:00:00 via INTRAVENOUS

## 2015-05-20 MED ORDER — PROPOFOL 10 MG/ML IV BOLUS
INTRAVENOUS | Status: AC
  Filled 2015-05-20: qty 40

## 2015-05-20 MED ORDER — PROPOFOL 10 MG/ML IV BOLUS
INTRAVENOUS | Status: AC
  Filled 2015-05-20: qty 20

## 2015-05-20 NOTE — Anesthesia Postprocedure Evaluation (Signed)
  Anesthesia Post-op Note  Patient: [name]  Procedure(s) Performed: Procedure(s) (LRB): UPPER ENDOSCOPIC ULTRASOUND (EUS) LINEAR (N/A)  Patient Location: PACU  Anesthesia Type: MAC  Level of Consciousness: awake and alert   Airway and Oxygen Therapy: Patient Spontanous Breathing  Post-op Pain: mild  Post-op Assessment: Post-op Vital signs reviewed, Patient's Cardiovascular Status Stable, Respiratory Function Stable, Patent Airway and No signs of Nausea or vomiting  Last Vitals:  Filed Vitals:   05/20/15 1150  BP: 185/62  Pulse: 59  Temp:   Resp: 16    Post-op Vital Signs: stable   Complications: No apparent anesthesia complications

## 2015-05-20 NOTE — Interval H&P Note (Signed)
History and Physical Interval Note:  05/20/2015 10:52 AM  [name]  has presented today for surgery, with the diagnosis of newly diagnosed GE junction adenocarcinoma  The various methods of treatment have been discussed with the patient and family. After consideration of risks, benefits and other options for treatment, the patient has consented to  Procedure(s): UPPER ENDOSCOPIC ULTRASOUND (EUS) LINEAR (N/A) as a surgical intervention .  The patient's history has been reviewed, patient examined, no change in status, stable for surgery.  I have reviewed the patient's chart and labs.  Questions were answered to the patient's satisfaction.     [name]

## 2015-05-20 NOTE — H&P (View-Only) (Signed)
[facility] [place] Patient Consult   Referring MD: Zenovia Jarred   JEWELLE ROWBERRY 74 y.o.  27-Jan-1941    Reason for Referral: Gastroesophageal carcinoma   HPI: She was admitted in August with dyspnea and found to have severe anemia. She reported melena. She was transfused with packed red blood cells. She underwent an upper endoscopy on 02/16/2015 that revealed a stricture at the GE junction and a hiatal hernia. No source for bleeding was identified. A colonoscopy on 02/17/2015 revealed mild diverticulosis. A pedunculated polyp in the descending colon was removed. This was felt to potentially be the source of blood loss. The pathology revealed a benign colon polyp. She had been maintained on anticoagulation prior to hospital admission. She was readmitted on 05/05/2015 with nausea/vomiting and abdominal pain. She had severe microcytic anemia. She was transfused with packed red blood cells. The PT/INR was therapeutic on hospital admission.  Gastroenterology was consulted and she was taken to a repeat upper endoscopy on 05/06/2015. A sessile 1.2 cm polypoid lesion was found at the cardia on the stomach side of the GE junction. This was biopsied. There was a large hiatal hernia. Cameron's erosions were found, not bleeding. The biopsy from the stomach mass revealed adenocarcinoma.  Dr. Marin Olp was consulted. The lesion was not seen on CT. An endoscopic ultrasound has been scheduled. She is referred to the [place] office secondary to proximity to her home. She has been seen by Dr. Lisbeth Renshaw.  Ms. Dona has discontinued anticoagulation therapy. She was last transfused with packed red blood cells on 05/05/2015. She cannot tolerate oral iron therapy.     Past Medical History  Diagnosis Date  . CHF (congestive heart failure) ([place])   . Hypertension   . Broken back 2013    chronic back pain.   . Atrial fibrillation ([place])   . GERD (gastroesophageal reflux disease)   . Arthritis   .  Stroke ([place])   . Pulmonary emboli ([place]) 2008, 2012  . Anemia 2005    generally microcytic, transfusions in [address], 2012, 02/2015, 05/2015  . HH (hiatus hernia) 2008    large with associated erosions.   . Allergy   . Esophageal cancer ([place]) 05/06/15    adenocarcinoma ge junction  . PONV (postoperative nausea and vomiting)   . Transfusion history      packed red blood cells and FFP August 2016, packed red blood cells November 2016   . H/O iron deficiency     05-06-15 iron infusion ([facility])    .   G4 P4  Past Surgical History  Procedure Laterality Date  . Lumbar disc surgery  1991  . Lumbar back surgery  2012  . Cervical disc surgery  2014  . Cholecystectomy  1980s    open  . Appendectomy  1950s  . Tonsillectomy and adenoidectomy  1960s  . Carpal tunnel release Bilateral 1990s  . Esophagogastroduodenoscopy N/A 02/16/2015    Procedure: ESOPHAGOGASTRODUODENOSCOPY (EGD);  Surgeon: [name], MD;  Location: [facility];  Service: Endoscopy;  Laterality: N/A;  . Colonoscopy N/A 02/17/2015    Procedure: COLONOSCOPY;  Surgeon: [name], MD;  Location: [facility] ENDOSCOPY;  Service: Endoscopy;  Laterality: N/A;  . Exploratory lab  1950s or 1960s  . Esophagogastroduodenoscopy N/A 05/06/2015    Procedure: ESOPHAGOGASTRODUODENOSCOPY (EGD);  Surgeon: [name], MD;  Location: [facility] ENDOSCOPY;  Service: Endoscopy;  Laterality: N/A;  . Givens capsule study N/A 05/06/2015    Procedure: GIVENS CAPSULE STUDY;  Surgeon: Lajuan Lines  Pyrtle, MD;  Location: [place];  Service: Endoscopy;  Laterality: N/A;    Medications: Reviewed  Allergies:  Allergies  Allergen Reactions  . Iodinated Diagnostic Agents Anaphylaxis    Info given by patient  . Milk-Related Compounds     Lactose intolerance  . Darvon [Propoxyphene] Rash  . Hydralazine Anxiety    Facial flushing, pt prefers not to use it.     Family history: A sister died of ovarian cancer at age 38. Another sister died at age 1 of a CVA. No other  family history of cancer.  Social History:   She lives in [place] with her son. She does not use tobacco or alcohol. She has worked in Hospital doctor, as a Agricultural consultant Asst., and with Production designer, theatre/television/film in the lab. She has been transfused with red blood cells on multiple occasions.  ROS:   Positives include: Anorexia, dysphagia, loose bowel movements, occasional angina, decreased visual acuity, severe occipital headache one week ago, chronic back pain and hand arthritis  A complete ROS was otherwise negative.  Physical Exam:  Blood pressure 175/43, pulse 63, temperature 98.3 F (36.8 C), temperature source Oral, resp. rate 18, height 5\' 2"  (1.575 m), weight 134 lb 3.2 oz (60.873 kg), SpO2 99 %.  HEENT: Multiple missing teeth, oropharynx without visible mass, neck without mass Lungs: Clear bilaterally Cardiac: Regular rate and rhythm, 2/6 systolic murmur Abdomen: No hepatosplenomegaly, mild tenderness in the mid upper abdomen, no mass, no apparent ascites  Vascular: No leg edema Lymph nodes: No cervical, supra-clavicular, axillary, or inguinal nodes Neurologic: Alert and oriented, the motor exam appears intact in the upper and lower extremities Skin: No rash Musculoskeletal: Tenderness at the lower spine   LAB:  CBC  Lab Results  Component Value Date   WBC 4.9 05/14/2015   HGB 11.7* 05/14/2015   HCT 36.3 05/14/2015   MCV 79.8 05/14/2015   PLT 185.0 05/14/2015   NEUTROABS 2.8 03/09/2015     CMP      Component Value Date/Time   NA 140 05/14/2015 1217   K 4.6 05/14/2015 1217   CL 102 05/14/2015 1217   CO2 33* 05/14/2015 1217   GLUCOSE 127* 05/14/2015 1217   BUN 18 05/14/2015 1217   CREATININE 1.01 05/14/2015 1217   CALCIUM 9.4 05/14/2015 1217   PROT 6.9 05/14/2015 1217   ALBUMIN 4.1 05/14/2015 1217   AST 16 05/14/2015 1217   ALT 11 05/14/2015 1217   ALKPHOS 77 05/14/2015 1217   BILITOT 0.5 05/14/2015 1217   GFRNONAA >60 05/08/2015 0318   GFRAA >60  05/08/2015 0318    Lab Results  Component Value Date   CEA <0.5 12/03/2006    Imaging:  CT of the abdomen and pelvis 05/05/2015-large hiatal hernia, no acute findings CT chest 05/07/2015-moderate hiatal hernia, no esophageal obstruction or periesophageal adenopathy, clear lungs, small pleural effusions  Assessment/Plan:   1. Adenocarcinoma of the gastric cardia/GE junction  Staging CT scans November 2016 with no tumor seen and no evidence of metastatic disease  2. Iron deficiency anemia, status post IV iron 05/06/2015, packed red blood cells 05/05/2015  3.   History of atrial fibrillation  4.   History of congestive heart failure  5.   History of a CVA  6.   History of pulmonary embolism, currently maintained off of anticoagulation  7.   Hiatal hernia   Disposition:   Ms. Bermingham has been diagnosed with adenocarcinoma of the gastric cardia near the GE junction. She appears to  have early stage disease based on the staging evaluation to date. She is scheduled for an endoscopic ultrasound this week.  She has been evaluated by Dr. Lisbeth Renshaw and is scheduled to see Dr. Servando Snare. If she has a locally advanced lesion on the EUS then she will be a candidate for concurrent chemotherapy and radiation. She may also be a candidate for chemotherapy and radiation if she has an early T stage lesion and is not a surgical candidate.  I discussed weekly Taxol/carboplatin with Ms. Pinson and her sister. We reviewed the potential toxicities associated with this regimen including the chance for nausea/vomiting, alopecia, mucositis, diarrhea, and hematologic toxicity. We discussed the allergic reaction associated with Taxol and carboplatin. We discussed the neuropathy and bone pain seen with Taxol. She agrees to proceed with chemotherapy if this is the recommended treatment plan. She will be scheduled for a chemotherapy teaching class.  Ms. Falardeau has been scheduled to start weekly chemotherapy  06/01/2015 pending the EUS evaluation later this week and consultation with Dr. Servando Snare.  The iron deficiency anemia is most likely related to the hiatal hernia while on anticoagulation therapy. We will monitor the hemoglobin and repeat IV iron therapy as needed.  She is currently maintained off of anticoagulation therapy with the recent GI bleeding. We will need to consider resuming anticoagulation in the future pending review of the pulmonary embolism history.  Approximately 50 minutes were spent with the patient today. The majority of the time was used for counseling and coordination of care.  [place], [place] 05/18/2015, 5:46 PM

## 2015-05-20 NOTE — Discharge Instructions (Signed)
YOU HAD AN ENDOSCOPIC PROCEDURE TODAY: Refer to the procedure report that was given to you for any specific questions about what was found during the examination.  If the procedure report does not answer your questions, please call your gastroenterologist to clarify.  YOU SHOULD EXPECT: Some feelings of bloating in the abdomen. Passage of more gas than usual.  Walking can help get rid of the air that was put into your GI tract during the procedure and reduce the bloating. If you had a lower endoscopy (such as a colonoscopy or flexible sigmoidoscopy) you may notice spotting of blood in your stool or on the toilet paper.   DIET: Your first meal following the procedure should be a light meal and then it is ok to progress to your normal diet.  A half-sandwich or bowl of soup is an example of a good first meal.  Heavy or fried foods are harder to digest and may make you feel nasueas or bloated.  Drink plenty of fluids but you should avoid alcoholic beverages for 24 hours.  ACTIVITY: Your care partner should take you home directly after the procedure.  You should plan to take it easy, moving slowly for the rest of the day.  You can resume normal activity the day after the procedure however you should NOT DRIVE or use heavy machinery for 24 hours (because of the sedation medicines used during the test).    SYMPTOMS TO REPORT IMMEDIATELY  A gastroenterologist can be reached at any hour.  Please call your doctor's office for any of the following symptoms:   Following lower endoscopy (colonoscopy, flexible sigmoidoscopy)  Excessive amounts of blood in the stool  Significant tenderness, worsening of abdominal pains  Swelling of the abdomen that is new, acute  Fever of 100 or higher  Following upper endoscopy (EGD, EUS, ERCP)  Vomiting of blood or coffee ground material  New, significant abdominal pain  New, significant chest pain or pain under the shoulder blades  Painful or persistently difficult  swallowing  New shortness of breath  Black, tarry-looking stools  FOLLOW UP: If any biopsies were taken you will be contacted by phone or by letter within the next 1-3 weeks.  Call your gastroenterologist if you have not heard about the biopsies in 3 weeks.  Please also call your gastroenterologist's office with any specific questions about appointments or follow up tests.   Moderate Conscious Sedation, Adult, Care After Refer to this sheet in the next few weeks. These instructions provide you with information on caring for yourself after your procedure. Your health care provider may also give you more specific instructions. Your treatment has been planned according to current medical practices, but problems sometimes occur. Call your health care provider if you have any problems or questions after your procedure. WHAT TO EXPECT AFTER THE PROCEDURE  After your procedure:  You may feel sleepy, clumsy, and have poor balance for several hours.  Vomiting may occur if you eat too soon after the procedure. HOME CARE INSTRUCTIONS  Do not participate in any activities where you could become injured for at least 24 hours. Do not:  Drive.  Swim.  Ride a bicycle.  Operate heavy machinery.  Cook.  Use power tools.  Climb ladders.  Work from a high place.  Do not make important decisions or sign legal documents until you are improved.  If you vomit, drink water, juice, or soup when you can drink without vomiting. Make sure you have little or no nausea  before eating solid foods.  Only take over-the-counter or prescription medicines for pain, discomfort, or fever as directed by your health care provider.  Make sure you and your family fully understand everything about the medicines given to you, including what side effects may occur.  You should not drink alcohol, take sleeping pills, or take medicines that cause drowsiness for at least 24 hours.  If you smoke, do not smoke without  supervision.  If you are feeling better, you may resume normal activities 24 hours after you were sedated.  Keep all appointments with your health care provider. SEEK MEDICAL CARE IF:  Your skin is pale or bluish in color.  You continue to feel nauseous or vomit.  Your pain is getting worse and is not helped by medicine.  You have bleeding or swelling.  You are still sleepy or feeling clumsy after 24 hours. SEEK IMMEDIATE MEDICAL CARE IF:  You develop a rash.  You have difficulty breathing.  You develop any type of allergic problem.  You have a fever. MAKE SURE YOU:  Understand these instructions.  Will watch your condition.  Will get help right away if you are not doing well or get worse.   This information is not intended to replace advice given to you by your health care provider. Make sure you discuss any questions you have with your health care provider.   Document Released: 04/09/2013 Document Revised: 07/10/2014 Document Reviewed: 04/09/2013 Elsevier Interactive Patient Education [employer].

## 2015-05-20 NOTE — Anesthesia Preprocedure Evaluation (Signed)
Anesthesia Evaluation  Patient identified by MRN, date of birth, ID band Patient awake    Reviewed: Allergy & Precautions, NPO status , Patient's Chart, lab work & pertinent test results  History of Anesthesia Complications (+) PONV  Airway Mallampati: II  TM Distance: >3 FB Neck ROM: Full    Dental no notable dental hx.    Pulmonary PE   Pulmonary exam normal breath sounds clear to auscultation       Cardiovascular hypertension, Pt. on medications +CHF  Normal cardiovascular exam+ dysrhythmias Atrial Fibrillation  Rhythm:Regular Rate:Normal     Neuro/Psych CVA, No Residual Symptoms negative psych ROS   GI/Hepatic Neg liver ROS, hiatal hernia, PUD, GERD  ,  Endo/Other  negative endocrine ROS  Renal/GU negative Renal ROS  negative genitourinary   Musculoskeletal negative musculoskeletal ROS (+)   Abdominal   Peds negative pediatric ROS (+)  Hematology negative hematology ROS (+)   Anesthesia Other Findings   Reproductive/Obstetrics negative OB ROS                             Anesthesia Physical Anesthesia Plan  ASA: III  Anesthesia Plan: MAC   Post-op Pain Management:    Induction:   Airway Management Planned: Nasal Cannula and Simple Face Mask  Additional Equipment:   Intra-op Plan:   Post-operative Plan:   Informed Consent: I have reviewed the patients History and Physical, chart, labs and discussed the procedure including the risks, benefits and alternatives for the proposed anesthesia with the patient or authorized representative who has indicated his/her understanding and acceptance.   Dental advisory given  Plan Discussed with: CRNA  Anesthesia Plan Comments:         Anesthesia Quick Evaluation

## 2015-05-20 NOTE — Op Note (Signed)
[facility] [place], [id number]   ENDOSCOPIC ULTRASOUND PROCEDURE REPORT  PATIENT: [name], [name]  MR#: [id number] BIRTHDATE: 1941/04/12  GENDER: female ENDOSCOPIST: [name], MD REFERRED BY:  Zenovia Jarred, MD PROCEDURE DATE:  05/20/2015 PROCEDURE:   Upper EUS ASA CLASS:      Class III INDICATIONS:   1.  recent diagnosed GE junction adenocarcinoma (EGD Dr.  Hilarie Fredrickson) without obvious metastatic disease on CT. MEDICATIONS: Monitored anesthesia care  DESCRIPTION OF PROCEDURE:   After the risks benefits and alternatives of the procedure were  explained, informed consent was obtained. The patient was then placed in the left, lateral, decubitus postion and IV sedation was administered. Throughout the procedure, the patients blood pressure, pulse and oxygen saturations were monitored continuously.  Under direct visualization, the PENTAX EUS SCOPE  endoscope was introduced through the mouth  and advanced to the second portion of the duodenum .  Water was used as necessary to provide an acoustic interface.  Upon completion of the imaging, water was removed and the patient was sent to the recovery room in satisfactory condition.  Endoscopic findings: 1. Small (1-2cm), non-circumferential malignant appearing mass at the GE juntion, above a small hiatal hernia.  EUS findings: 1. The mass above correlates with a hypoechoic, irregularly bordered mass that focally invades into but not through the muscularis propria layer at the GE junction (uT2). The mass measures 1.6cm by 0.7cm. 2. No paraesophageal, celiac, mediastinal or gastrohepatic adenopathy  (uN0) 3. Limited views of liver, pancreas, spleen, portal and splenic vessels were all normal.  ENDOSCOPIC IMPRESSION: 1.6cm by 0.7cm non-circumferential uT2N0 (Stage IIa) GE junction adenocarcinoma.   _______________________________ eSignedMilus Banister, MD 05/20/2015 11:28 AM   CC: Julieanne Manson, MD; Norlene Campbell MD

## 2015-05-20 NOTE — Transfer of Care (Signed)
Immediate Anesthesia Transfer of Care Note  Patient: [name]  Procedure(s) Performed: Procedure(s): UPPER ENDOSCOPIC ULTRASOUND (EUS) LINEAR (N/A)  Patient Location: PACU and Endoscopy Unit  Anesthesia Type:MAC  Level of Consciousness: awake, sedated and responds to stimulation  Airway & Oxygen Therapy: Patient Spontanous Breathing and Patient connected to nasal cannula oxygen  Post-op Assessment: Report given to RN and Post -op Vital signs reviewed and stable  Post vital signs: Reviewed and stable  Last Vitals:  Filed Vitals:   05/20/15 0926  BP: 200/37  Pulse: 53  Temp: 36.4 C  Resp: 18    Complications: No apparent anesthesia complications

## 2015-05-21 DIAGNOSIS — I110 Hypertensive heart disease with heart failure: Secondary | ICD-10-CM | POA: Diagnosis not present

## 2015-05-21 DIAGNOSIS — K5790 Diverticulosis of intestine, part unspecified, without perforation or abscess without bleeding: Secondary | ICD-10-CM | POA: Diagnosis not present

## 2015-05-21 DIAGNOSIS — R262 Difficulty in walking, not elsewhere classified: Secondary | ICD-10-CM | POA: Diagnosis not present

## 2015-05-21 DIAGNOSIS — D509 Iron deficiency anemia, unspecified: Secondary | ICD-10-CM | POA: Diagnosis not present

## 2015-05-21 DIAGNOSIS — K449 Diaphragmatic hernia without obstruction or gangrene: Secondary | ICD-10-CM | POA: Diagnosis not present

## 2015-05-21 DIAGNOSIS — C169 Malignant neoplasm of stomach, unspecified: Secondary | ICD-10-CM | POA: Diagnosis not present

## 2015-05-24 ENCOUNTER — Encounter (HOSPITAL_COMMUNITY): Payer: Self-pay | Admitting: Gastroenterology

## 2015-05-24 DIAGNOSIS — C169 Malignant neoplasm of stomach, unspecified: Secondary | ICD-10-CM | POA: Diagnosis not present

## 2015-05-24 DIAGNOSIS — D509 Iron deficiency anemia, unspecified: Secondary | ICD-10-CM | POA: Diagnosis not present

## 2015-05-24 DIAGNOSIS — K5790 Diverticulosis of intestine, part unspecified, without perforation or abscess without bleeding: Secondary | ICD-10-CM | POA: Diagnosis not present

## 2015-05-24 DIAGNOSIS — K449 Diaphragmatic hernia without obstruction or gangrene: Secondary | ICD-10-CM | POA: Diagnosis not present

## 2015-05-24 DIAGNOSIS — I110 Hypertensive heart disease with heart failure: Secondary | ICD-10-CM | POA: Diagnosis not present

## 2015-05-24 DIAGNOSIS — R262 Difficulty in walking, not elsewhere classified: Secondary | ICD-10-CM | POA: Diagnosis not present

## 2015-05-25 DIAGNOSIS — I110 Hypertensive heart disease with heart failure: Secondary | ICD-10-CM | POA: Diagnosis not present

## 2015-05-25 DIAGNOSIS — R262 Difficulty in walking, not elsewhere classified: Secondary | ICD-10-CM | POA: Diagnosis not present

## 2015-05-25 DIAGNOSIS — C169 Malignant neoplasm of stomach, unspecified: Secondary | ICD-10-CM | POA: Diagnosis not present

## 2015-05-25 DIAGNOSIS — D509 Iron deficiency anemia, unspecified: Secondary | ICD-10-CM | POA: Diagnosis not present

## 2015-05-25 DIAGNOSIS — K449 Diaphragmatic hernia without obstruction or gangrene: Secondary | ICD-10-CM | POA: Diagnosis not present

## 2015-05-25 DIAGNOSIS — K5790 Diverticulosis of intestine, part unspecified, without perforation or abscess without bleeding: Secondary | ICD-10-CM | POA: Diagnosis not present

## 2015-05-26 ENCOUNTER — Encounter: Payer: Self-pay | Admitting: *Deleted

## 2015-05-26 ENCOUNTER — Encounter: Payer: Self-pay | Admitting: Cardiothoracic Surgery

## 2015-05-26 ENCOUNTER — Encounter: Payer: Self-pay | Admitting: Oncology

## 2015-05-26 ENCOUNTER — Other Ambulatory Visit: Payer: Self-pay | Admitting: *Deleted

## 2015-05-26 ENCOUNTER — Institutional Professional Consult (permissible substitution) (INDEPENDENT_AMBULATORY_CARE_PROVIDER_SITE_OTHER): Payer: Medicare Other | Admitting: Cardiothoracic Surgery

## 2015-05-26 ENCOUNTER — Other Ambulatory Visit: Payer: Medicare Other

## 2015-05-26 VITALS — BP 149/50 | HR 65 | Resp 20 | Ht 62.0 in | Wt 133.0 lb

## 2015-05-26 DIAGNOSIS — C160 Malignant neoplasm of cardia: Secondary | ICD-10-CM

## 2015-05-26 NOTE — Progress Notes (Signed)
[address]       [place] [address]             [phone]                    Marthann J Yousuf [place] Medical Record [id number] Date of Birth: [birth year]  Referring: Kyung Rudd, MD Primary Care: Mauricio Po, [name]  Chief Complaint:    Chief Complaint  Patient presents with  . Esophageal Cancer    Surgical eval, Chest CT 05/07/15, ABD/Pelvis CT 05/05/15    History of Present Illness:    [name] 74 y.o. 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 She was admitted in August with dyspnea and found to have severe anemia. She reported melena. She was transfused with two  packed red blood cells She has beed on coumadin for history of AFIB and stroke . She underwent an upper endoscopy on 02/16/2015 that revealed a stricture at the GE junction and a hiatal hernia. No source for bleeding was identified. A colonoscopy on 02/17/2015 revealed mild diverticulosis. A pedunculated polyp in the descending colon was removed. This was felt to potentially be the source of blood loss. The pathology revealed a benign colon polyp.   She was readmitted on 05/05/2015 with nausea/vomiting and abdominal pain. She had severe microcytic anemia. She was transfused with 2 units packed red blood cells.    Repeat upper endoscopy on 05/06/2015. A sessile 1.2 cm polypoid lesion was found at the cardia on the stomach side of the GE junction. This was biopsied. There was a large hiatal hernia. Cameron's erosions were found, not bleeding. The biopsy from the stomach mass revealed adenocarcinoma.   EUS is been performed and CT scan of the chest abdomen pelvis was done suggesting clinical stage to a adenocarcinoma of the GE junction.   Current Activity/ Functional Status:  Patient is independent with mobility/ambulation, transfers, ADL's, IADL's.   Zubrod Score: At the time of surgery this patient's most appropriate activity status/level should be described  as: []     0    Normal activity, no symptoms []     1    Restricted in physical strenuous activity but ambulatory, able to do out light work [x]     2    Ambulatory and capable of self care, unable to do work activities, up and about               >50 % of waking hours                              []     3    Only limited self care, in bed greater than 50% of waking hours []     4    Completely disabled, no self care, confined to bed or chair []     5    Moribund  Patient is able to ambulate around the house and care for self, however she lives with her son who does most of the housework and grocery shopping.  Past Medical History  Diagnosis Date  . CHF (congestive heart failure) ([place])   . Hypertension   . Broken back 2013    chronic back pain.   . Atrial fibrillation ([place])   . GERD (gastroesophageal reflux disease)   . Arthritis   . Stroke ([place])   . Pulmonary emboli ([place]) 2008, 2012  .  Anemia 2005    generally microcytic, transfusions in [address], 2012, 02/2015, 05/2015  . HH (hiatus hernia) 2008    large with associated erosions.   . Allergy   . Esophageal cancer ([place]) 05/06/15    adenocarcinoma ge junction  . PONV (postoperative nausea and vomiting)   . Transfusion history     2 units transfused 05-06-15([facility])  . H/O iron deficiency     05-06-15 iron infusion ([facility])    Past Surgical History  Procedure Laterality Date  . Lumbar disc surgery  1991  . Lumbar back surgery  2012  . Cervical disc surgery  2014  . Cholecystectomy  1980s    open  . Appendectomy  1950s  . Tonsillectomy and adenoidectomy  1960s  . Carpal tunnel release Bilateral 1990s  . Esophagogastroduodenoscopy N/A 02/16/2015    Procedure: ESOPHAGOGASTRODUODENOSCOPY (EGD);  Surgeon: [name], MD;  Location: [facility]COPY;  Service: Endoscopy;  Laterality: N/A;  . Colonoscopy N/A 02/17/2015    Procedure: COLONOSCOPY;  Surgeon: [name], MD;  Location: [facility] ENDOSCOPY;  Service: Endoscopy;  Laterality: N/A;  .  Exploratory lab  1950s or 1960s  . Esophagogastroduodenoscopy N/A 05/06/2015    Procedure: ESOPHAGOGASTRODUODENOSCOPY (EGD);  Surgeon: [name], MD;  Location: [facility] ENDOSCOPY;  Service: Endoscopy;  Laterality: N/A;  . Givens capsule study N/A 05/06/2015    Procedure: GIVENS CAPSULE STUDY;  Surgeon: [name], MD;  Location: [facility] ENDOSCOPY;  Service: Endoscopy;  Laterality: N/A;  . Eus N/A 05/20/2015    Procedure: UPPER ENDOSCOPIC ULTRASOUND (EUS) LINEAR;  Surgeon: [name], MD;  Location: [facility] ENDOSCOPY;  Service: Endoscopy;  Laterality: N/A;    Family History  Problem Relation Age of Onset  . Stroke Mother   . Heart disease Mother   . Emphysema Father   . Ovarian cancer Sister   . Stroke Sister   . Other Child     died at birth    Social History   Social History  . Marital Status: Divorced    Spouse Name: N/A  . Number of Children: 3  . Years of Education: 14   Occupational History  . Retired    Social History Main Topics  . Smoking status: Never Smoker   . Smokeless tobacco: Never Used  . Alcohol Use: No  . Drug Use: No  . Sexual Activity: Not on file   Other Topics Concern  . Not on file   Social History Narrative   Born and raised in [place], [place]. Currently resides in a house with her son. 1 dog. Fun: go to church   Divorced(Has total of #3 children)-[place], [place], [place]   Denies religious beliefs that would effect health care.    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[employer]    History  Smoking status  . Never Smoker   Smokeless tobacco  . Never Used    History  Alcohol Use No     Allergies  Allergen Reactions  . Iodinated Diagnostic Agents Anaphylaxis    Info given by patient  . Milk-Related Compounds     Lactose intolerance  . Darvon [Propoxyphene] Rash  . Hydralazine Anxiety    Facial flushing, pt prefers not to use it.     Current Outpatient Prescriptions    Medication Sig Dispense Refill  . amLODipine (NORVASC) 10 MG tablet Take 1 tablet (10 mg total) by mouth daily. 90 tablet 3  . cloNIDine (CATAPRES)  0.1 MG tablet Take 1 tablet (0.1 mg total) by mouth daily as needed (high blood pressure, will take if levels are 180/90). (Patient taking differently: Take 0.1 mg by mouth daily as needed (high blood pressure, take if blood pressure is >179/99.). ) 30 tablet 0  . furosemide (LASIX) 40 MG tablet TAKE 1 TO 2 TABLETS BY MOUTH ONCE DAILY. (Patient taking differently: TAKE 1 TO 2 TABLETS BY MOUTH ONCE DAILY. Takes 2 tablets if foot swelling is bad.) 30 tablet 0  . gabapentin (NEURONTIN) 100 MG capsule TAKE 1 CAPSULE BY MOUTH THREE TIMES A DAY. 90 capsule 2  . meclizine (ANTIVERT) 25 MG tablet Take 1 tablet (25 mg total) by mouth 3 (three) times daily as needed for dizziness. 30 tablet 0  . metoprolol (LOPRESSOR) 50 MG tablet Take 50 mg by mouth 2 (two) times daily.    . NONFORMULARY OR COMPOUNDED ITEM Diclofenac/Baclofen/Cyclobenzaprine/Gabapentin/Lidocaine 3/2/2/6/2.5% Cream  Apply to affected area 2-3 times per day as needed for arthritic pain. 1 each 1  . ondansetron (ZOFRAN) 4 MG tablet Take 4 mg by mouth every 8 (eight) hours as needed for nausea or vomiting.    [name] oxycodone (OXY-IR) 5 MG capsule Take 1 capsule (5 mg total) by mouth 2 (two) times daily as needed for pain. (Patient taking differently: Take 5 mg by mouth 2 (two) times daily as needed (severe pain). ) 60 capsule 0  . pantoprazole (PROTONIX) 40 MG tablet Take 1 tablet (40 mg total) by mouth 2 (two) times daily. 60 tablet 0  . simvastatin (ZOCOR) 20 MG tablet Take 20 mg by mouth at bedtime.    . temazepam (RESTORIL) 15 MG capsule Take 1 capsule (15 mg total) by mouth at bedtime. 30 capsule 1  . tiZANidine (ZANAFLEX) 4 MG tablet Take 4 mg by mouth every 6 (six) hours as needed for muscle spasms.    . traMADol (ULTRAM) 50 MG tablet Take 1 tablet (50 mg total) by mouth every 8 (eight) hours as  needed for moderate pain. 90 tablet 0  . valsartan-hydrochlorothiazide (DIOVAN-HCT) 320-25 MG tablet Take 0.5 tablets by mouth daily.    . vitamin B-12 1000 MCG tablet Take 1 tablet (1,000 mcg total) by mouth daily. 30 tablet 0  . warfarin (COUMADIN) 5 MG tablet Take 5 mg by mouth daily at 6 PM.     No current facility-administered medications for this visit.      Review of Systems:     Cardiac Review of Systems: Y or N  Chest Pain [n    ]  Resting SOB [n   ] Exertional SOB  y[  ]  Orthopnea [  n]   Pedal Edema [ y  ]    Palpitations [ y ] Syncope  [ n ]   Presyncope [ n  ]  General Review of Systems: [Y] = yes [  ]=no Constitional: recent weight change [ n ];  Wt loss over the last 3 months [   ] anorexia [ y ]; fatigue [  y]; nausea [  ]; night sweats [  ]; fever [ n ]; or chills [  ];          Dental: poor dentition[  ]; Last Dentist visit:   Eye : blurred vision [  ]; diplopia [   ]; vision changes [  ];  Amaurosis fugax[  ]; Resp: cough [  ];  wheezing[  ];  hemoptysis[  ]; shortness of breath[  ]; paroxysmal  nocturnal dyspnea[  ]; dyspnea on exertion[  ]; or orthopnea[  ];  GI:  gallstones[  ], vomiting[  ];  dysphagia[  ]; melena[ y ];  hematochezia [  ]; heartburn[ y ];   Hx of  Colonoscopy[ y ]; GU: kidney stones [  ]; hematuria[  ];   dysuria [  ];  nocturia[  ];  history of     obstruction [  ]; urinary frequency [  ]             Skin: rash, swelling[  ];, hair loss[  ];  peripheral edema[  ];  or itching[  ]; Musculosketetal: myalgias[  ];  joint swelling[  ];  joint erythema[  ];  joint pain[  ];  back pain[ chronic  ];  Heme/Lymph: bruising[  ];  bleeding[  ];  anemia[  ];  Neuro: TIA[  ];  headaches[  ];  stroke[  ];  vertigo[  ];  seizures[  ];   paresthesias[  ];  difficulty walking[ y due to back pain ];  Psych:depression[  ]; anxiety[  ];  Endocrine: diabetes[  ];  thyroid dysfunction[  ];  Immunizations: Flu up to date [ y ]; Pneumococcal up to date [ y  ];  Other:  Physical Exam: BP 149/50 mmHg  Pulse 65  Resp 20  Ht 5\' 2"  (1.575 m)  Wt 133 lb (60.328 kg)  BMI 24.32 kg/m2  SpO2 96%  PHYSICAL EXAMINATION: General appearance: alert, cooperative, appears older than stated age and pale Head: Normocephalic, without obvious abnormality, atraumatic Neck: no adenopathy, no carotid bruit, no JVD, supple, symmetrical, trachea midline and thyroid not enlarged, symmetric, no tenderness/mass/nodules Lymph nodes: Cervical, supraclavicular, and axillary nodes normal. Resp: clear to auscultation bilaterally Back: symmetric, no curvature. ROM normal. No CVA tenderness. Cardio: regular rate and rhythm, S1, S2 normal, holosystolic murmur of aortic stenosis with radiation to both carotids, click, rub or gallop GI: soft, non-tender; bowel sounds normal; no masses,  no organomegaly Extremities: extremities normal, atraumatic, no cyanosis or edema and Homans sign is negative, no sign of DVT Neurologic: Grossly normal No cervical supraclavicular adenopathy but the patient does have a left lower neck incision from previous anterior approach for neck fusion.  Diagnostic Studies & Laboratory data:     Recent Radiology Findings:   Ct Abdomen Pelvis Wo Contrast  05/05/2015  CLINICAL DATA:  Abdominal pain and lightheadedness.  Nausea. EXAM: CT ABDOMEN AND PELVIS WITHOUT CONTRAST TECHNIQUE: Multidetector CT imaging of the abdomen and pelvis was performed following the standard protocol without IV contrast. COMPARISON:  05/11/2014 FINDINGS: There is a large hiatal hernia. Stomach and small bowel are otherwise unremarkable. Prior appendectomy. Mild colonic diverticulosis. No evidence of diverticulitis or other acute inflammatory process. Unremarkable appearances of the kidneys, ureters and urinary bladder. Incidentally noted upper pole left renal cyst, unchanged. Adrenals are normal. Prior cholecystectomy. Moderate dilatation of the extrahepatic bile ducts, likely due  to the cholecystectomy. Unremarkable unenhanced appearances of the liver, spleen, and pancreas. Incidentally noted benign calcified 1.7 cm lesion at the anterior periphery of the spleen, unchanged. Extensive atherosclerotic calcification of the aorta and iliac arteries. Normal caliber. Uterus and adnexal regions are unremarkable. No significant abnormality in the lower chest except for the large hiatal hernia. No significant musculoskeletal abnormality. Incidentally noted small fat containing umbilical hernia. IMPRESSION: 1. No acute findings are evident in the abdomen or pelvis. 2. Large hiatal hernia. 3. Post cholecystectomy expansion of the extrahepatic biliary system, unchanged.  4. Diverticulosis. Electronically Signed   By: Andreas Newport M.D.   On: 05/05/2015 00:58   Ct Chest Wo Contrast  05/07/2015  CLINICAL DATA:  New diagnosis of tumor at gastroesophageal junction. Large hiatal hernia. Esophageal cancer. EXAM: CT CHEST WITHOUT CONTRAST TECHNIQUE: Multidetector CT imaging of the chest was performed following the standard protocol without IV contrast. COMPARISON:  02/14/2015 chest CT.  05/05/2015 abdominal pelvic CT. FINDINGS: Mediastinum/Nodes: No supraclavicular adenopathy. Aortic and branch vessel atherosclerosis. Moderate cardiomegaly with multivessel coronary artery atherosclerosis. Aortic valvular calcifications. Mitral annular calcifications. No mediastinal or definite hilar adenopathy, given limitations of unenhanced CT. A moderate hiatal hernia again identified. No esophageal obstruction or periesophageal adenopathy. Lungs/Pleura: Small right and trace left pleural effusions, slightly increased. Minimal motion degradation inferiorly.  Clear lungs. Upper abdomen: Cholecystectomy. Normal imaged portions of the liver, adrenal glands, kidneys, pancreas. A calcified anterior splenic lesion is again identified at 11 mm (image 49, series 2). No upper abdominal adenopathy identified. Abdominal aortic  and branch vessel atherosclerosis. Musculoskeletal: Lower cervical spine fixation. Thoracic spondylosis. IMPRESSION: 1.  No acute process or evidence of metastatic disease in the chest. 2. Minimal motion degradation inferiorly. 3. Slight increase in small right larger than left pleural effusions. 4. Moderate hiatal hernia. 5.  Atherosclerosis, including within the coronary arteries. 6. Aortic valvular calcifications which may represent valvular disease. Consider echocardiography. 7. Calcified splenic lesion is unchanged and likely related to remote infection or trauma. Electronically Signed   By: Abigail Miyamoto M.D.   On: 05/07/2015 17:02     I have independently reviewed the above radiologic studies. EUS Endoscopic findings: 1. Small (1-2cm), non-circumferential malignant appearing mass at the GE juntion, above a small hiatal hernia. EUS findings: 1. The mass above correlates with a hypoechoic, irregularly bordered mass that focally invades into but not through the muscularis propria layer at the GE junction (uT2). The mass measures 1.6cm by 0.7cm. 2. No paraesophageal, celiac, mediastinal or gastrohepatic adenopathy (uN0) 3. Limited views of liver, pancreas, spleen, portal and splenic vessels were all normal. ENDOSCOPIC IMPRESSION: 1.6cm by 0.7cm non-circumferential uT2N0 (Stage IIa) GE junction adenocarcinoma  Recent Lab Findings: Lab Results  Component Value Date   WBC 4.9 05/14/2015   HGB 11.7* 05/14/2015   HCT 36.3 05/14/2015   PLT 185.0 05/14/2015   GLUCOSE 127* 05/14/2015   CHOL  05/14/2010    145        ATP III CLASSIFICATION:  <200     mg/dL   Desirable  200-239  mg/dL   Borderline High  >=240    mg/dL   High          TRIG 75 05/14/2010   HDL 45 05/14/2010   LDLCALC  05/14/2010    85        Total Cholesterol/HDL:CHD Risk Coronary Heart Disease Risk Table                     Men   Women  1/2 Average Risk   3.4   3.3  Average Risk       5.0   4.4  2 X Average Risk   9.6    7.1  3 X Average Risk  23.4   11.0        Use the calculated Patient Ratio above and the CHD Risk Table to determine the patient's CHD Risk.        ATP III CLASSIFICATION (LDL):  <100     mg/dL   Optimal  100-129  mg/dL   Near or Above                    Optimal  130-159  mg/dL   Borderline  160-189  mg/dL   High  >190     mg/dL   Very High   ALT 11 05/14/2015   AST 16 05/14/2015   NA 140 05/14/2015   K 4.6 05/14/2015   CL 102 05/14/2015   CREATININE 1.01 05/14/2015   BUN 18 05/14/2015   CO2 33* 05/14/2015   TSH 1.823 05/06/2015   INR 2.85* 05/04/2015   ECHO 02/13/2015 Study Conclusions  - Left ventricle: The cavity size was normal. Systolic function was normal. The estimated ejection fraction was in the range of 60% to 65%. Wall motion was normal; there were no regional wall motion abnormalities. The study is not technically sufficient to allow evaluation of LV diastolic function. - Aortic valve: Cusp separation was reduced. There was mild stenosis. Peak velocity (S): 247 cm/s. Mean gradient (S): 13 mm Hg. Valve area (VTI): 1.37 cm^2. Valve area (Vmax): 1.29 cm^2. Valve area (Vmean): 1.29 cm^2. - Mitral valve: Calcified annulus. Mildly thickened leaflets . There was mild regurgitation. Valve area by continuity equation (using LVOT flow): 2.28 cm^2. - Left atrium: The atrium was moderately dilated.  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 The patient does have significant medical problems including chronic back pain limiting her overall physical activity, chronic anticoagulation for atrial fibrillation and history of strokes and TIA 4.  I recommended to her that we #1 proceed with that scan to better rule out evidence of distal metastasis #2 obtain cardiac clearance and evaluation of cardiac risk with surgery from Dr. Debara Pickett who follows the patient for her coronary artery disease and atrial fibrillation and aortic stenosis. I will asked Dr. Barry Dienes to see the patient in regard to possible extended gastrectomy, and perform the surgical resection strictly from the abdomen decreasing the overall morbidity and mortality from a full esophagectomy. After the above is completed I'll plan to see the patient back in further review with her the treatment options as far proceeding with surgical resection before entertaining chemotherapy and radiation.   I  spent 40 minutes counseling the patient face to face and 50% or more the  time was spent in counseling and coordination of care. The total time spent in the appointment was 60 minutes.  Grace Isaac MD      [address] [place] [place], [address] Office [phone]   Beeper [phone]  05/26/2015 4:25 PM

## 2015-05-26 NOTE — Progress Notes (Signed)
Introduced myself as her FA.  With her 2 insurances she may not need copay assistance but I informed her of the [place] and what it covers.  She would like to apply for the grant so she will bring her bank statement on her next visit to see if she qualifies.  She has my card for any questions or concerns she may have in the future.

## 2015-05-31 ENCOUNTER — Ambulatory Visit (HOSPITAL_COMMUNITY)
Admission: RE | Admit: 2015-05-31 | Discharge: 2015-05-31 | Disposition: A | Discharge status: Home / Self Care | Payer: Medicare Other | Source: Ambulatory Visit | Attending: Cardiothoracic Surgery | Admitting: Cardiothoracic Surgery

## 2015-05-31 ENCOUNTER — Encounter: Payer: Self-pay | Admitting: Oncology

## 2015-05-31 ENCOUNTER — Telehealth: Payer: Self-pay | Admitting: *Deleted

## 2015-05-31 ENCOUNTER — Other Ambulatory Visit: Payer: Self-pay | Admitting: *Deleted

## 2015-05-31 DIAGNOSIS — Z0189 Encounter for other specified special examinations: Secondary | ICD-10-CM | POA: Diagnosis not present

## 2015-05-31 DIAGNOSIS — C160 Malignant neoplasm of cardia: Secondary | ICD-10-CM

## 2015-05-31 DIAGNOSIS — K449 Diaphragmatic hernia without obstruction or gangrene: Secondary | ICD-10-CM | POA: Diagnosis not present

## 2015-05-31 LAB — GLUCOSE, CAPILLARY: Glucose-Capillary: 124 mg/dL — ABNORMAL HIGH (ref 65–99)

## 2015-05-31 MED ORDER — TECHNETIUM TC 99M MEDRONATE IV KIT: 27.0000 | PACK | Freq: Once | INTRAVENOUS | Status: DC | PRN

## 2015-05-31 MED ORDER — FLUDEOXYGLUCOSE F - 18 (FDG) INJECTION
7.9000 | Freq: Once | INTRAVENOUS | Status: AC | PRN
  Administered 2015-05-31: 7.9 via INTRAVENOUS

## 2015-05-31 NOTE — Progress Notes (Signed)
Pt is approved for the $400 [facility] grant.  °

## 2015-05-31 NOTE — Telephone Encounter (Signed)
Oncology Nurse Navigator Documentation  Oncology Nurse Navigator Flowsheets 05/31/2015  Referral date to RadOnc/MedOnc -  Navigator Encounter Type Telephone;Other--In basket  Patient Visit Type -  Treatment Phase -  Barriers/Navigation Needs -  Education -  Interventions Coordination of Care  Referrals Other---message to Dr. Derry Lory for appointment and asking Dr. Servando Snare wishes her to be seen this week by Dr. Barry Dienes in [place] MD Appointments--cancel appointments this week per Dr. Benay Spice.  Education Method -  Support Groups/Services -  Time Spent with Patient -  Dr. Servando Snare saw patient on 11/23 and suggests cardiac approval for surgery, proceed with PET and to see Dr. Barry Dienes in consult also. Will await PET results prior to cancellation of her appointments to determine if cancer is metastatic or not.

## 2015-05-31 NOTE — Telephone Encounter (Signed)
  Oncology Nurse Navigator Documentation    Navigator Encounter Type: Telephone (05/31/15 1507): Notified her that PET scan was negative! Moved her nutrition appointment tomorrow to 12:00 and she was comfortable with this. Requested she keep her schedule open on 06/04/15 at 0900 to potentially see Dr. Barry Dienes. She will do so and was appreciative.

## 2015-05-31 NOTE — Telephone Encounter (Signed)
Received call from pt stating that she wants to talk with Merceda Elks RN/Navigator b/c she just called her & she is confused about appts.  She also states that her Heart MD cancelled appt for today & she wants [place] to call & see if she can be seen.  Message left for Merceda Elks.

## 2015-06-01 ENCOUNTER — Ambulatory Visit: Payer: Medicare Other

## 2015-06-01 ENCOUNTER — Telehealth: Payer: Self-pay | Admitting: Internal Medicine

## 2015-06-01 ENCOUNTER — Other Ambulatory Visit: Payer: Medicare Other

## 2015-06-01 ENCOUNTER — Encounter: Payer: Self-pay | Admitting: Physician Assistant

## 2015-06-01 ENCOUNTER — Ambulatory Visit: Payer: Medicare Other | Admitting: Nutrition

## 2015-06-01 ENCOUNTER — Ambulatory Visit (INDEPENDENT_AMBULATORY_CARE_PROVIDER_SITE_OTHER): Payer: Medicare Other | Admitting: Physician Assistant

## 2015-06-01 VITALS — BP 162/58 | HR 60 | Ht 62.0 in | Wt 132.8 lb

## 2015-06-01 DIAGNOSIS — I10 Essential (primary) hypertension: Secondary | ICD-10-CM

## 2015-06-01 DIAGNOSIS — Z01818 Encounter for other preprocedural examination: Secondary | ICD-10-CM

## 2015-06-01 NOTE — Telephone Encounter (Signed)
Received records from  [facility] as requesed by Rosaria Ferries, PA .today.  [facility] to [facility]. lp

## 2015-06-01 NOTE — Patient Instructions (Signed)
We will have you to sign a medical release from the physician  in [place]. We will contact you pending the records.

## 2015-06-01 NOTE — Progress Notes (Signed)
Cardiology Office Note   Date:  06/01/2015   ID:  [name] 30-Jun-1941, MRN [id number]  PCP:  Mauricio Po, [name]  Cardiologist:  Dr Marchelle Folks, PA-C   Chief Complaint  Patient presents with  . Follow-up    pt states no chest pain no SOB no light headedness or dizziness no edema    History of Present Illness: [name] is a 74 y.o. female with a history of afib on coumadin, CVA, recent dx adeno CA stomach, D-CHF, mild AS, anemia, GERD.  [name] presents for preoperative surgical evaluation prior to surgery for adenocarcinoma of the gastroesophageal junction. Per Dr. Everrett Coombe note, she may need an extended gastrectomy.  [name] never gets chest pain. Her activity is limited by back issues. She has not climbed a flight of stairs and long time. She does not exercise regularly. However, she has been very busy recently cleaning out closets, packing boxes for [employer], and doing other things around the house. She will vacuum and occasionally mop the floors. She does not get dyspnea on exertion or shortness of breath with these activities.  She had a stress test in [place] in 2012 where she had an anaphylactic reaction to a medication. She remembers that it was a nuclear stress test. She cannot remember what she had a reaction to. She remembers that her hands began swelling and she became short of breath. EMS was called and she was hospitalized. She also has a history of a dye allergy, but does not remember getting dye that day. Because of this, she is very concerned about the possibility of needing a stress test.   Past Medical History  Diagnosis Date  . CHF (congestive heart failure) ([place])   . Hypertension   . Broken back 2013    chronic back pain.   . Atrial fibrillation ([place])   . GERD (gastroesophageal reflux disease)   . Arthritis   . Stroke ([place])   . Pulmonary emboli ([place]) 2008, 2012  . Anemia 2005    generally  microcytic, transfusions in [address], 2012, 02/2015, 05/2015  . HH (hiatus hernia) 2008    large with associated erosions.   . Allergy   . Esophageal cancer ([place]) 05/06/15    adenocarcinoma ge junction  . PONV (postoperative nausea and vomiting)   . Transfusion history     2 units transfused 05-06-15([facility])  . H/O iron deficiency     05-06-15 iron infusion ([facility])    Past Surgical History  Procedure Laterality Date  . Lumbar disc surgery  1991  . Lumbar back surgery  2012  . Cervical disc surgery  2014  . Cholecystectomy  1980s    open  . Appendectomy  1950s  . Tonsillectomy and adenoidectomy  1960s  . Carpal tunnel release Bilateral 1990s  . Esophagogastroduodenoscopy N/A 02/16/2015    Procedure: ESOPHAGOGASTRODUODENOSCOPY (EGD);  Surgeon: [name], MD;  Location: [facility];  Service: Endoscopy;  Laterality: N/A;  . Colonoscopy N/A 02/17/2015    Procedure: COLONOSCOPY;  Surgeon: [name], MD;  Location: [facility];  Service: Endoscopy;  Laterality: N/A;  . Exploratory lab  1950s or 1960s  . Esophagogastroduodenoscopy N/A 05/06/2015    Procedure: ESOPHAGOGASTRODUODENOSCOPY (EGD);  Surgeon: [name], MD;  Location: [facility];  Service: Endoscopy;  Laterality: N/A;  . Givens capsule study N/A 05/06/2015    Procedure: GIVENS CAPSULE STUDY;  Surgeon: [name], MD;  Location: [facility];  Service: Endoscopy;  Laterality: N/A;  . Eus N/A 05/20/2015    Procedure: UPPER ENDOSCOPIC ULTRASOUND (EUS) LINEAR;  Surgeon: [name], MD;  Location: [facility] ENDOS[facility];  Service: Endoscopy;  Laterality: N/A;    Current Outpatient Prescriptions  Medication Sig Dispense Refill  . amLODipine (NORVASC) 10 MG tablet Take 1 tablet (10 mg total) by mouth daily. 90 tablet 3  . cloNIDine (CATAPRES) 0.1 MG tablet Take 1 tablet (0.1 mg total) by mouth daily as needed (high blood pressure, will take if levels are 180/90). (Patient taking differently: Take 0.1 mg by mouth daily as needed  (high blood pressure, take if blood pressure is >179/99.). ) 30 tablet 0  . furosemide (LASIX) 40 MG tablet TAKE 1 TO 2 TABLETS BY MOUTH ONCE DAILY. (Patient taking differently: TAKE 1 TO 2 TABLETS BY MOUTH ONCE DAILY. Takes 2 tablets if foot swelling is bad.) 30 tablet 0  . gabapentin (NEURONTIN) 100 MG capsule TAKE 1 CAPSULE BY MOUTH THREE TIMES A DAY. 90 capsule 2  . meclizine (ANTIVERT) 25 MG tablet Take 1 tablet (25 mg total) by mouth 3 (three) times daily as needed for dizziness. 30 tablet 0  . metoprolol (LOPRESSOR) 50 MG tablet Take 50 mg by mouth 2 (two) times daily.    . NONFORMULARY OR COMPOUNDED ITEM Diclofenac/Baclofen/Cyclobenzaprine/Gabapentin/Lidocaine 3/2/2/6/2.5% Cream  Apply to affected area 2-3 times per day as needed for arthritic pain. 1 each 1  . ondansetron (ZOFRAN) 4 MG tablet Take 4 mg by mouth every 8 (eight) hours as needed for nausea or vomiting.    [name] oxycodone (OXY-IR) 5 MG capsule Take 1 capsule (5 mg total) by mouth 2 (two) times daily as needed for pain. (Patient taking differently: Take 5 mg by mouth 2 (two) times daily as needed (severe pain). ) 60 capsule 0  . pantoprazole (PROTONIX) 40 MG tablet Take 1 tablet (40 mg total) by mouth 2 (two) times daily. 60 tablet 0  . simvastatin (ZOCOR) 20 MG tablet Take 20 mg by mouth at bedtime.    . temazepam (RESTORIL) 15 MG capsule Take 1 capsule (15 mg total) by mouth at bedtime. 30 capsule 1  . tiZANidine (ZANAFLEX) 4 MG tablet Take 4 mg by mouth every 6 (six) hours as needed for muscle spasms.    . traMADol (ULTRAM) 50 MG tablet Take 1 tablet (50 mg total) by mouth every 8 (eight) hours as needed for moderate pain. 90 tablet 0  . valsartan-hydrochlorothiazide (DIOVAN-HCT) 320-25 MG tablet Take 0.5 tablets by mouth daily.    . vitamin B-12 1000 MCG tablet Take 1 tablet (1,000 mcg total) by mouth daily. 30 tablet 0  . warfarin (COUMADIN) 5 MG tablet Take 5 mg by mouth daily at 6 PM.     No current facility-administered  medications for this visit.   Facility-Administered Medications Ordered in Other Visits  Medication Dose Route Frequency Provider Last Rate Last Dose  . technetium medronate (TC-MDP) injection 27 milli Curie  27 milli Curie Intravenous Once PRN Grace Isaac, MD   [place] at 05/31/15 1030    Allergies:   Iodinated diagnostic agents; Milk-related compounds; Darvon; and Hydralazine   Social History:  The patient  reports that she has never smoked. She has never used smokeless tobacco. She reports that she does not drink alcohol or use illicit drugs.   Family History:  The patient's family history includes Emphysema in her father; Heart disease in her mother; Other in her child; Ovarian cancer in  her sister; Stroke in her mother and sister.    ROS:  Please see the history of present illness. All other systems are reviewed and negative.    PHYSICAL EXAM: VS:  BP 162/58 mmHg  Pulse 60  Ht 5\' 2"  (1.575 m)  Wt 132 lb 12.8 oz (60.238 kg)  BMI 24.28 kg/m2 , BMI Body mass index is 24.28 kg/(m^2). GEN: Well nourished, well developed, female in no acute distress HEENT: normal for age  Neck: no JVD, no carotid bruit, no masses Cardiac: RRR; 2/6 murmur, no rubs, or gallops Respiratory:  clear to auscultation bilaterally, normal work of breathing GI: soft, nontender, nondistended, + BS MS: no deformity or atrophy; no edema; distal pulses are 2+ in all 4 extremities  Skin: warm and dry, no rash Neuro:  Strength and sensation are intact Psych: euthymic mood, full affect   EKG:  EKG is ordered today. The ekg ordered today demonstrates sinus rhythm, rate 60, no acute ischemic changes   Recent Labs: 05/05/2015: B Natriuretic Peptide 103.7* 05/06/2015: TSH 1.823 05/14/2015: ALT 11; BUN 18; Creatinine, Ser 1.01; Hemoglobin 11.7*; Platelets 185.0; Potassium 4.6; Sodium 140    Lipid Panel    Component Value Date/Time   CHOL  05/14/2010 0254    145        ATP III CLASSIFICATION:   <200     mg/dL   Desirable  200-239  mg/dL   Borderline High  >=240    mg/dL   High          TRIG 75 05/14/2010 0254   HDL 45 05/14/2010 0254   CHOLHDL 3.2 05/14/2010 0254   VLDL 15 05/14/2010 0254   LDLCALC  05/14/2010 0254    85        Total Cholesterol/HDL:CHD Risk Coronary Heart Disease Risk Table                     Men   Women  1/2 Average Risk   3.4   3.3  Average Risk       5.0   4.4  2 X Average Risk   9.6   7.1  3 X Average Risk  23.4   11.0        Use the calculated Patient Ratio above and the CHD Risk Table to determine the patient's CHD Risk.        ATP III CLASSIFICATION (LDL):  <100     mg/dL   Optimal  100-129  mg/dL   Near or Above                    Optimal  130-159  mg/dL   Borderline  160-189  mg/dL   High  >190     mg/dL   Very High     Wt Readings from Last 3 Encounters:  06/01/15 132 lb 12.8 oz (60.238 kg)  05/26/15 133 lb (60.328 kg)  05/20/15 133 lb (60.328 kg)     Other studies Reviewed: Additional studies/ records that were reviewed today include: Past office notes, Dr. Everrett Coombe consult note, previous ECG.  ASSESSMENT AND PLAN:  1.  Preoperative evaluation: Ms. Gaitor has not had a stress cyst since 2010. She has no history of ischemic symptoms with activity has been very limited by musculoskeletal issues. This surgery is potentially extensive. He'll stress test is indicated but the patient is very reluctant to undergo a stress test because of her previous reaction.   We are trying to get  records from [facility], [phone] regarding her hospitalization in 2012.  Regardless, I will discuss with Dr. Debara Pickett if stress testing is needed.  Current medicines are reviewed at length with the patient today.  The patient does not have concerns regarding medicines.  The following changes have been made:  no change  Labs/ tests ordered today include:   Orders Placed This Encounter  Procedures  . EKG 12-Lead   Disposition:   FU with  Dr Debara Pickett, decision on stress testing to be made before Friday.  Augusto Garbe  06/01/2015 11:35 AM    [facility] [address], [place], [place]  [address] Phone: [phone]; Fax: [phone]

## 2015-06-01 NOTE — Telephone Encounter (Signed)
Faxed signed release to [facility] for records from 2013 per request of Rosaria Ferries, [place].  Faxed on 06/01/15. lp

## 2015-06-01 NOTE — Progress Notes (Signed)
74 year old female diagnosed with cancer of the GE junction.  She is a patient of Dr. Benay Spice.  Past medical history includes CHF, hypertension, atrial fibrillation, GERD, stroke, anemia, hiatal hernia and iron deficiency.  Medications include Lasix, Zofran, Protonix, Zocor, vitamin B12, and Coumadin.  Labs were reviewed.  Height: 62 inches. Weight: 132.8 pounds November 29. Usual body weight: 130-135 pounds. BMI: 24.28.  Patient reports an allergy to dairy products.  States her throat closes up if she consumes She complains of early satiety, nausea and vomiting, and diarrhea with 4-5 stools daily. States diarrhea has been occurring for a "long time" and she never knows what will trigger it. Patient is unsure of treatment plan at this time.  Nutrition diagnosis: Food and nutrition related knowledge deficit related to new cancer diagnosis as evidenced by no prior need for nutrition related information.  Intervention:  Educated patient to consume small frequent meals and snacks utilizing high protein, high calorie foods to promote weight maintenance. Offered patient other options of high protein foods other than dairy products. Educated patient on low fiber diet to see if this would improve diarrhea.  Encouraged patient to keep a food journal. Fact sheets were provided.  Questions were answered.  Teach back method was used and contact information was given.  Monitoring, evaluation, goals: Patient will tolerate adequate calories and protein to promote weight maintenance.  Next visit: To be scheduled.  **Disclaimer: This note was dictated with voice recognition software. Similar sounding words can inadvertently be transcribed and this note may contain transcription errors which may not have been corrected upon publication of note.**

## 2015-06-02 ENCOUNTER — Telehealth: Payer: Self-pay | Admitting: *Deleted

## 2015-06-02 ENCOUNTER — Encounter: Payer: Self-pay | Admitting: *Deleted

## 2015-06-02 NOTE — Progress Notes (Signed)
This encounter was created in error - please disregard.

## 2015-06-02 NOTE — Telephone Encounter (Signed)
Spoke with Donnie Aho PA and Dr Debara Pickett has reviewed patients information received  Per Suanne Marker Dr Debara Pickett says patient is at increased but acceptable risk for surgery, nor further workup required. Advised patient who has upcoming appointment with Dr Barry Dienes at [facility]

## 2015-06-02 NOTE — Telephone Encounter (Signed)
  Oncology Nurse Navigator Documentation    Navigator Encounter Type: Telephone (06/02/15 0855) : [name] to confirm Dr. Barry Dienes will see her on 12/2 at 0900-arrive at [place]. Informed her she will also be seen by physical therapy for surgery preparation, energy conservation and scar/tissue massage. She is concerned about her cardiology visit-asking when they will know if she needs stress test. Will see what Dr. Barry Dienes says, then pursue this more aggressively.

## 2015-06-03 DIAGNOSIS — K449 Diaphragmatic hernia without obstruction or gangrene: Secondary | ICD-10-CM | POA: Diagnosis not present

## 2015-06-03 DIAGNOSIS — D509 Iron deficiency anemia, unspecified: Secondary | ICD-10-CM | POA: Diagnosis not present

## 2015-06-03 DIAGNOSIS — I110 Hypertensive heart disease with heart failure: Secondary | ICD-10-CM | POA: Diagnosis not present

## 2015-06-03 DIAGNOSIS — R262 Difficulty in walking, not elsewhere classified: Secondary | ICD-10-CM | POA: Diagnosis not present

## 2015-06-03 DIAGNOSIS — K5790 Diverticulosis of intestine, part unspecified, without perforation or abscess without bleeding: Secondary | ICD-10-CM | POA: Diagnosis not present

## 2015-06-03 DIAGNOSIS — C169 Malignant neoplasm of stomach, unspecified: Secondary | ICD-10-CM | POA: Diagnosis not present

## 2015-06-04 ENCOUNTER — Encounter: Payer: Self-pay | Admitting: *Deleted

## 2015-06-04 ENCOUNTER — Telehealth: Payer: Self-pay | Admitting: *Deleted

## 2015-06-04 ENCOUNTER — Ambulatory Visit: Payer: Medicare Other | Attending: Oncology | Admitting: Physical Therapy

## 2015-06-04 DIAGNOSIS — M545 Low back pain, unspecified: Secondary | ICD-10-CM

## 2015-06-04 DIAGNOSIS — C160 Malignant neoplasm of cardia: Secondary | ICD-10-CM | POA: Diagnosis not present

## 2015-06-04 DIAGNOSIS — C801 Malignant (primary) neoplasm, unspecified: Secondary | ICD-10-CM | POA: Diagnosis not present

## 2015-06-04 NOTE — Patient Instructions (Signed)
Tips for Energy Conservation for Activities of Daily Living . Plan ahead to avoid rushing. . Sit down to bathe and dry off. Wear a terry robe instead of drying off. . Use a shower/bath organizer to decrease leaning and reaching. . Use extension handles on sponges and brushes. Susa Simmonds grab rails in the bathroom or use an elevated toilet seat. Hoyle Barr out clothes and toiletries before dressing. . Minimize leaning over to put on clothes and shoes. Bring your foot to your knee to apply socks and shoes. . Wear comfortable shoes and low-heeled, slip on shoes. Wear button front shirts rather than pullovers. Housekeeping . Schedule household tasks throughout the week. . Do housework sitting down when possible. . Delegate heavy housework, shopping, laundry and child care when possible. . Drag or slide objects rather than lifting. . Sit when ironing and take rest periods. . Stop working before becoming overly tired. Shopping . Organize list by aisle. . Use a grocery cart for support. [name] Shop at less busy times. . Ask for help with getting to the car. Meal Preparation . Use convenience and easy-to-prepare foods. . Use small appliances that take less effort to use. [name] Prepare meals sitting down. . Soak dishes instead of scrubbing and let dishes air dry. . Prepare double portions and freeze half. Child Care . Plan activities that can be done sitting down, such as drawing pictures, playing games, reading, and computer games. . Encourage children to climb up onto your lap or into the highchair instead of being lifted. . Make a game of the household chores so that children will want to help. . Delegate child care when possible.  Earlie Counts, PT, [facility]; [place], [place], [place] [address]   Ways to get started on an exercise program 1.  Start for 10 minutes per day with a walking program. 2. Work towards 30 minutes of exercise per  day 3. When you do an aerobic exercise program start on a low level 4. Water aerobics is a good place due to decreased strain on your joints 5. Begin your exercise program gradually and progress slowly over time 6. When exercising use correct form. a. Keep neutral spine b. Engage abdominals c. Keep chest up  d. Chin down e. Do not lock your knees  Earlie Counts, PT [facility], [address] [place], [place] [address] [phone]     Try these techniques to guard against pain after abdominal surgery: . Squeeze a pillow to your chest when coughing. . To get out of bed, squeeze a pillow to your chest and roll onto your side first.  Then, push up on your elbow to sit up while still hugging the pillow with the other arm. . To move to the edge of the bed, continue to hug the pillow while you scoot your hips to the edge of the bed. . If you must use your hands to assist with standing up, place your hands on your knees, lean forward, and stand up using your legs primarily.  Place as little weight on your arms as possible. Earlie Counts, [facility] [address], [address], [place] [address], [phone]  Scar Massage  Scar massage is done to improve the mobility of scar, decrease scar tissue from building up, reduce adhesions, and prevent Keloids from forming. Start scar massage after scabs have fallen off by themselves and  no open areas. The first few weeks after surgery, it is normal for a scar to appear pink or red and slightly raised. Scars can itch or have areas of numbness. Some scars may be sensitive.   Direct Scar massage: after scar is healed, no opening, no scab 1.  Place pads of two fingers together directly on the scar starting at one end of the scar. Move the fingers up and down across the scar holding 5 seconds one direction.  Then go opposite direction hold 5 seconds.  2. Move over to the  next section of the scar and repeat.  Work your way along the entire length of the scar.   3. Next make diagonal movements along the scar holding 5 seconds at one direction. 4. Next movement is side to side. 5. Do not rub fingers over the scar.  Instead keep firm pressure and move scar over the tissue it is on top   Scar Lift and Roll 12 weeks after surgery. 1. Pinch a small amount of the scar between your first two fingers and thumb.  2. Roll the scar between your fingers for 5 to 15 seconds. 3. Move along the scar and repeat until you have massaged the entire length of scar.   Stop the massage and call your doctor if you notice: 1. Increased redness 2. Bleeding from scar 3. Seepage coming from the scar 4. Scar is warmer and has increased pain

## 2015-06-04 NOTE — Progress Notes (Signed)
[facility] Psychosocial Distress Screening Clinical Social Work  Clinical Social Work met with pt at [facility] to introduce self and explain role of CSW/Pt and Family Support Team. CSW also was reviewed distress screening protocol.  The patient scored a 5 on the Psychosocial Distress Thermometer which indicates mild distress. Clinical Social Worker further assessed for distress and other psychosocial needs. She reports her biggest concerns are finances and she reports she was approved for the [employer]. She has concerns about medical bills ad how to afford her treatment. CSW discussed possible copay assistance funds that might be available and also reminded her about the grant. Pt lives in [place] and reports to have good support from her sister who will bring her to all appointments. CSW and pt processed common emotions related to cancer diagnosis and pt shared she is "ready to get it out". Pt open to CSW follow up for support and resource needs in the future. Pt appreciative of supportive listening today.   ONCBCN DISTRESS SCREENING 06/04/2015  Screening Type Initial Screening  Distress experienced in past week (1-10) 5  Practical problem type Insurance  Family Problem type   Emotional problem type Adjusting to illness  Information Concerns Type   Physical Problem type   Physician notified of physical symptoms   Referral to clinical social work Yes  Referral to support programs Yes    Clinical Social Worker follow up needed: Yes.    If yes, follow up plan: See above Loren Racer, [place] Worker [place]  [facility] Phone: [phone] Fax: [phone]

## 2015-06-04 NOTE — Progress Notes (Signed)
Oncology Nurse Navigator Documentation  Oncology Nurse Navigator Flowsheets 06/04/2015  Referral date to RadOnc/MedOnc -  Navigator Encounter Type Clinic/MDC  Patient Visit Type Surgery- Dr. Barry Dienes  Treatment Phase Treatment planning  Barriers/Navigation Needs Family concerns  Education Preparing for Upcoming Surgery/ Treatment  Interventions Coordination of Care--Dr. Ezzie Dural at [facility] MD Appointments  Education Method Written;Verbal  Support Groups/Services GI  Time Spent with Patient 58  Was seen in [facility] today by surgeon, CSW and physical therapy. V/S 178/64  97.5-61-20 sat 100% with weight 131.0 lb. Will be seen at [facility] by Dr. Terrial Rhodes Patti--Dr. Barry Dienes called office to discuss. Records faxed to [phone] att: Alm Bustard Patient has her films on CD to bring to appointment.

## 2015-06-04 NOTE — Therapy (Addendum)
[facility] [address], [place], [place], [address] Phone: [phone]   Fax:  [phone]  Physical Therapy Evaluation  Patient Details  Name: [name] MRN: [id number] Date of Birth: June 26, 1941 Referring Provider: Dr. Betsy Coder  Encounter Date: 06/04/2015      PT End of Session - 06/04/15 1021    Visit Number 1   PT Start Time 1025   PT Stop Time 1037   PT Time Calculation (min) 12 min   Activity Tolerance Patient tolerated treatment well   Behavior During Therapy [facility] for tasks assessed/performed      Past Medical History  Diagnosis Date  . CHF (congestive heart failure) ([place])   . Hypertension   . Broken back 2013    chronic back pain.   . Atrial fibrillation ([place])   . GERD (gastroesophageal reflux disease)   . Arthritis   . Stroke ([place])   . Pulmonary emboli ([place]) 2008, 2012  . Anemia 2005    generally microcytic, transfusions in [address], 2012, 02/2015, 05/2015  . HH (hiatus hernia) 2008    large with associated erosions.   . Allergy   . 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 PONV (postoperative nausea and vomiting)   . Transfusion history     2 units transfused 05-06-15([facility])  . H/O iron deficiency     05-06-15 iron infusion ([facility])    Past Surgical History  Procedure Laterality Date  . Lumbar disc surgery  1991  . Lumbar back surgery  2012  . Cervical disc surgery  2014  . Cholecystectomy  1980s    open  . Appendectomy  1950s  . Tonsillectomy and adenoidectomy  1960s  . Carpal tunnel release Bilateral 1990s  . Esophagogastroduodenoscopy N/A 02/16/2015    Procedure: ESOPHAGOGASTRODUODENOSCOPY (EGD);  Surgeon: [name], MD;  Location: [facility];  Service: Endoscopy;  Laterality: N/A;  . Colonoscopy N/A 02/17/2015    Procedure: COLONOSCOPY;  Surgeon: [name], MD;  Location: [facility] ENDOSCOPY;  Service: Endoscopy;  Laterality: N/A;  . Exploratory lab   1950s or 1960s  . Esophagogastroduodenoscopy N/A 05/06/2015    Procedure: ESOPHAGOGASTRODUODENOSCOPY (EGD);  Surgeon: [name], MD;  Location: [facility] ENDOSCOPY;  Service: Endoscopy;  Laterality: N/A;  . Givens capsule study N/A 05/06/2015    Procedure: GIVENS CAPSULE STUDY;  Surgeon: [name], MD;  Location: [facility] ENDOSCOPY;  Service: Endoscopy;  Laterality: N/A;  . Eus N/A 05/20/2015    Procedure: UPPER ENDOSCOPIC ULTRASOUND (EUS) LINEAR;  Surgeon: [name], MD;  Location: [facility] ENDOSCOPY;  Service: Endoscopy;  Laterality: N/A;    There were no vitals filed for this visit.  Visit Diagnosis:  Low back pain without sciatica M54.81      Subjective Assessment - 06/04/15 1017    Subjective Patient is attending GI clinic.    Currently in Pain? Yes   Pain Score 4    Pain Location Back   Pain Orientation Lower   Pain Descriptors / Indicators Aching   Pain Type Chronic pain   Pain Onset More than a month ago   Pain Frequency Constant   Aggravating Factors  movement   Pain Relieving Factors rest            [facility] PT Assessment - 06/04/15 0001    Assessment   Medical Diagnosis GE Junction Cancer   Referring Provider Dr. Betsy Coder   Onset Date/Surgical Date 05/06/15   Precautions  Precautions Other (comment)   Precaution Comments Cancer precautions   Balance Screen   Has the patient fallen in the past 6 months No   Has the patient had a decrease in activity level because of a fear of falling?  No   Is the patient reluctant to leave their home because of a fear of falling?  No   Home Ecologist residence   Living Arrangements Children   Prior Function   Level of Independence Independent   Cognition   Overall Cognitive Status Within Functional Limits for tasks assessed   Observation/Other Assessments   Focus on Therapeutic Outcomes (FOTO)  NO limitations   ROM / Strength   AROM / PROM / Strength Strength   Strength   Overall Strength  Comments General strength 4/5                           PT Education - 27-Jun-2015 1020    Education provided Yes   Education Details walking program, tips to conserve energy, scar massage, techniques to guard against pain with coughing   Person(s) Educated Patient   Methods Explanation;Demonstration;Handout   Comprehension Returned demonstration;Verbalized understanding             PT Long Term Goals - 06-27-2015 1023    PT LONG TERM GOAL #1   Title understand ways to cough to decrease strain on abdominal   Time 1   Period Days   Status Achieved   PT LONG TERM GOAL #2   Title how to start a walking program to build endurance   Time 1   Period Days   Status Achieved   PT LONG TERM GOAL #4   Title understand ways to conserve energy and how to perform scar massage after surgery   Time 1   Period Days   PT LONG TERM GOAL #5   Status Achieved               Plan - 2015-06-27 1022    Clinical Impression Statement Patient is a 74 year old female with diagnosis GE Junction Cancer per upper endoscopic on 05/06/2015. Patient reports back pain at level 4/10 contantly and worse with movement.  Patient is attending GI clinic today. Patient lives with her son.  Patient may possibly have surgery. Pateint benefits from physical therapy to educate on how to cough  , how to conserve energy, walking program, and scar massage.    Pt will benefit from skilled therapeutic intervention in order to improve on the following deficits Decreased endurance   Rehab Potential Excellent   Clinical Impairments Affecting Rehab Potential None   PT Frequency 1x / week   PT Duration --  1 time session in GI clinic   PT Treatment/Interventions Patient/family education   PT Next Visit Plan Discharge to HEP   PT Home Exercise Plan Current HEP   Recommended Other Services None   Consulted and Agree with Plan of Care Patient          G-Codes - 2015-06-27 1021    Functional Assessment  Tool Used Therapist discretion   Functional Limitation Other PT primary   Other PT Primary Current Status [id number]) 0 percent impaired, limited or restricted   Other PT Primary Goal Status [id number]) 0 percent impaired, limited or restricted   Other PT Primary Discharge Status [id number]) 0 percent impaired, limited or restricted       Problem List Patient  Active Problem List   Diagnosis Date Noted  . 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 [place] lesion, acute   . IDA (iron deficiency anemia)   . Occult GI bleeding   . Nausea vomiting and diarrhea 05/05/2015  . Anemia 05/05/2015  . HLD (hyperlipidemia) 05/05/2015  . Abdominal pain 05/05/2015  . GERD (gastroesophageal reflux disease) 05/05/2015  . Absolute anemia   . Anticoagulated on Coumadin   . Osteoarthritis 04/16/2015  . History of pulmonary embolism 03/02/2015  . Shortness of breath 02/22/2015  . Benign neoplasm of descending colon 02/17/2015  . Diverticulosis of large intestine without diverticulitis 02/17/2015  . Esophageal stricture 02/16/2015  . UGI bleed 02/13/2015  . Supratherapeutic INR 02/13/2015  . Encounter for therapeutic drug monitoring 10/15/2014  . Thoracic back pain 10/15/2014  . Macular degeneration 09/01/2014  . Pneumonia 08/27/2014  . Benign paroxysmal positional vertigo   . Hypertension 08/24/2014  . Back pain 08/03/2014  . Complaints of total body pain 08/03/2014  . Sleep disturbance 08/03/2014  . Aortic stenosis 03/05/2014  . PAF (paroxysmal atrial fibrillation) ([place]) 01/21/2014  . Long term current use of anticoagulant therapy 01/21/2014  . Chronic diastolic (congestive) heart failure ([place]) 01/21/2014  . Chest pain 01/21/2014    GRAY,CHERYL,PT 06/04/2015, 10:49 AM  [facility] [address] [place], [place], [address] Phone: [phone]   Fax:  [phone]  Name: [name] MRN: [id number] Date of Birth: March 01, 1941

## 2015-06-04 NOTE — Telephone Encounter (Signed)
Wanted nurse aware that she has appointment at [facility] on 06/07/15 at 0900. Called to inform nurse and express her appreciation of how quickly everyone has moved to get her taken care of. She will call with update after the visit. Also reports she purchased a wig today.

## 2015-06-07 DIAGNOSIS — M8580 Other specified disorders of bone density and structure, unspecified site: Secondary | ICD-10-CM | POA: Diagnosis not present

## 2015-06-07 DIAGNOSIS — C155 Malignant neoplasm of lower third of esophagus: Secondary | ICD-10-CM | POA: Diagnosis not present

## 2015-06-07 DIAGNOSIS — Z885 Allergy status to narcotic agent status: Secondary | ICD-10-CM | POA: Diagnosis not present

## 2015-06-07 DIAGNOSIS — K449 Diaphragmatic hernia without obstruction or gangrene: Secondary | ICD-10-CM | POA: Diagnosis not present

## 2015-06-07 DIAGNOSIS — Z79899 Other long term (current) drug therapy: Secondary | ICD-10-CM | POA: Diagnosis not present

## 2015-06-07 DIAGNOSIS — Z86711 Personal history of pulmonary embolism: Secondary | ICD-10-CM | POA: Diagnosis not present

## 2015-06-07 DIAGNOSIS — Z9049 Acquired absence of other specified parts of digestive tract: Secondary | ICD-10-CM | POA: Diagnosis not present

## 2015-06-07 DIAGNOSIS — D649 Anemia, unspecified: Secondary | ICD-10-CM | POA: Diagnosis not present

## 2015-06-07 DIAGNOSIS — Z981 Arthrodesis status: Secondary | ICD-10-CM | POA: Diagnosis not present

## 2015-06-07 DIAGNOSIS — G8929 Other chronic pain: Secondary | ICD-10-CM | POA: Diagnosis not present

## 2015-06-07 DIAGNOSIS — I509 Heart failure, unspecified: Secondary | ICD-10-CM | POA: Diagnosis not present

## 2015-06-07 DIAGNOSIS — Z91011 Allergy to milk products: Secondary | ICD-10-CM | POA: Diagnosis not present

## 2015-06-07 DIAGNOSIS — Z7901 Long term (current) use of anticoagulants: Secondary | ICD-10-CM | POA: Diagnosis not present

## 2015-06-07 DIAGNOSIS — Z91041 Radiographic dye allergy status: Secondary | ICD-10-CM | POA: Diagnosis not present

## 2015-06-07 DIAGNOSIS — I350 Nonrheumatic aortic (valve) stenosis: Secondary | ICD-10-CM | POA: Diagnosis not present

## 2015-06-07 DIAGNOSIS — Z823 Family history of stroke: Secondary | ICD-10-CM | POA: Diagnosis not present

## 2015-06-07 DIAGNOSIS — Z836 Family history of other diseases of the respiratory system: Secondary | ICD-10-CM | POA: Diagnosis not present

## 2015-06-07 DIAGNOSIS — Z8673 Personal history of transient ischemic attack (TIA), and cerebral infarction without residual deficits: Secondary | ICD-10-CM | POA: Diagnosis not present

## 2015-06-07 DIAGNOSIS — M549 Dorsalgia, unspecified: Secondary | ICD-10-CM | POA: Diagnosis not present

## 2015-06-07 DIAGNOSIS — I4891 Unspecified atrial fibrillation: Secondary | ICD-10-CM | POA: Diagnosis not present

## 2015-06-07 DIAGNOSIS — Z8249 Family history of ischemic heart disease and other diseases of the circulatory system: Secondary | ICD-10-CM | POA: Diagnosis not present

## 2015-06-07 DIAGNOSIS — I110 Hypertensive heart disease with heart failure: Secondary | ICD-10-CM | POA: Diagnosis not present

## 2015-06-07 DIAGNOSIS — C169 Malignant neoplasm of stomach, unspecified: Secondary | ICD-10-CM | POA: Diagnosis not present

## 2015-06-07 DIAGNOSIS — C160 Malignant neoplasm of cardia: Secondary | ICD-10-CM | POA: Diagnosis not present

## 2015-06-07 DIAGNOSIS — Z888 Allergy status to other drugs, medicaments and biological substances status: Secondary | ICD-10-CM | POA: Diagnosis not present

## 2015-06-08 ENCOUNTER — Other Ambulatory Visit: Payer: Medicare Other

## 2015-06-08 ENCOUNTER — Ambulatory Visit: Payer: Medicare Other | Admitting: Cardiology

## 2015-06-08 ENCOUNTER — Ambulatory Visit: Payer: Medicare Other

## 2015-06-09 ENCOUNTER — Telehealth: Payer: Self-pay | Admitting: *Deleted

## 2015-06-09 ENCOUNTER — Encounter: Payer: Medicare Other | Admitting: Cardiothoracic Surgery

## 2015-06-09 DIAGNOSIS — C160 Malignant neoplasm of cardia: Secondary | ICD-10-CM | POA: Diagnosis not present

## 2015-06-09 DIAGNOSIS — K449 Diaphragmatic hernia without obstruction or gangrene: Secondary | ICD-10-CM | POA: Diagnosis not present

## 2015-06-09 DIAGNOSIS — I4891 Unspecified atrial fibrillation: Secondary | ICD-10-CM | POA: Diagnosis not present

## 2015-06-09 DIAGNOSIS — Z86711 Personal history of pulmonary embolism: Secondary | ICD-10-CM | POA: Diagnosis not present

## 2015-06-09 DIAGNOSIS — Z79899 Other long term (current) drug therapy: Secondary | ICD-10-CM | POA: Diagnosis not present

## 2015-06-09 DIAGNOSIS — K319 Disease of stomach and duodenum, unspecified: Secondary | ICD-10-CM | POA: Diagnosis not present

## 2015-06-09 DIAGNOSIS — C159 Malignant neoplasm of esophagus, unspecified: Secondary | ICD-10-CM | POA: Diagnosis not present

## 2015-06-09 DIAGNOSIS — I499 Cardiac arrhythmia, unspecified: Secondary | ICD-10-CM | POA: Diagnosis not present

## 2015-06-09 DIAGNOSIS — Z7901 Long term (current) use of anticoagulants: Secondary | ICD-10-CM | POA: Diagnosis not present

## 2015-06-09 DIAGNOSIS — I509 Heart failure, unspecified: Secondary | ICD-10-CM | POA: Diagnosis not present

## 2015-06-09 DIAGNOSIS — K3189 Other diseases of stomach and duodenum: Secondary | ICD-10-CM | POA: Diagnosis not present

## 2015-06-09 DIAGNOSIS — Z8673 Personal history of transient ischemic attack (TIA), and cerebral infarction without residual deficits: Secondary | ICD-10-CM | POA: Diagnosis not present

## 2015-06-09 DIAGNOSIS — Z955 Presence of coronary angioplasty implant and graft: Secondary | ICD-10-CM | POA: Diagnosis not present

## 2015-06-09 NOTE — Telephone Encounter (Signed)
  Oncology Nurse Navigator Documentation    Navigator Encounter Type: Telephone (06/09/15 1342): Left VM for Avianah to call back with surgical date. Saw in [place] she has PFT's on 12/13 and sees cardiologist there on 12/21. Her appointments at [facility] have been cancelled as requested.

## 2015-06-14 DIAGNOSIS — K5790 Diverticulosis of intestine, part unspecified, without perforation or abscess without bleeding: Secondary | ICD-10-CM | POA: Diagnosis not present

## 2015-06-14 DIAGNOSIS — C169 Malignant neoplasm of stomach, unspecified: Secondary | ICD-10-CM | POA: Diagnosis not present

## 2015-06-14 DIAGNOSIS — D509 Iron deficiency anemia, unspecified: Secondary | ICD-10-CM | POA: Diagnosis not present

## 2015-06-14 DIAGNOSIS — R262 Difficulty in walking, not elsewhere classified: Secondary | ICD-10-CM | POA: Diagnosis not present

## 2015-06-15 ENCOUNTER — Other Ambulatory Visit: Payer: Medicare Other

## 2015-06-15 ENCOUNTER — Other Ambulatory Visit: Payer: Self-pay | Admitting: Family

## 2015-06-15 ENCOUNTER — Ambulatory Visit: Payer: Medicare Other

## 2015-06-15 ENCOUNTER — Ambulatory Visit: Payer: Medicare Other | Admitting: Nurse Practitioner

## 2015-06-15 DIAGNOSIS — C155 Malignant neoplasm of lower third of esophagus: Secondary | ICD-10-CM | POA: Diagnosis not present

## 2015-06-15 DIAGNOSIS — R062 Wheezing: Secondary | ICD-10-CM | POA: Diagnosis not present

## 2015-06-17 ENCOUNTER — Other Ambulatory Visit: Payer: Self-pay

## 2015-06-17 DIAGNOSIS — G479 Sleep disorder, unspecified: Secondary | ICD-10-CM

## 2015-06-17 NOTE — Telephone Encounter (Signed)
Pt requesting refill of temazepam 15 mg. Last refill was 10/14.

## 2015-06-18 MED ORDER — TEMAZEPAM 15 MG PO CAPS: 15.0000 mg | ORAL_CAPSULE | Freq: Every day | ORAL | Status: DC

## 2015-06-18 NOTE — Telephone Encounter (Signed)
Faxed script back to [facility]...[name]

## 2015-06-18 NOTE — Telephone Encounter (Signed)
Patient is following up. Advised to allow more time.

## 2015-06-22 ENCOUNTER — Ambulatory Visit: Payer: Medicare Other

## 2015-06-22 ENCOUNTER — Other Ambulatory Visit: Payer: Medicare Other

## 2015-06-22 NOTE — Telephone Encounter (Signed)
Error

## 2015-06-23 DIAGNOSIS — I350 Nonrheumatic aortic (valve) stenosis: Secondary | ICD-10-CM | POA: Insufficient documentation

## 2015-06-23 DIAGNOSIS — Z79899 Other long term (current) drug therapy: Secondary | ICD-10-CM | POA: Diagnosis not present

## 2015-06-23 DIAGNOSIS — Z7901 Long term (current) use of anticoagulants: Secondary | ICD-10-CM | POA: Diagnosis not present

## 2015-06-23 DIAGNOSIS — I5032 Chronic diastolic (congestive) heart failure: Secondary | ICD-10-CM | POA: Diagnosis not present

## 2015-06-23 DIAGNOSIS — Z01810 Encounter for preprocedural cardiovascular examination: Secondary | ICD-10-CM | POA: Diagnosis not present

## 2015-06-23 DIAGNOSIS — C155 Malignant neoplasm of lower third of esophagus: Secondary | ICD-10-CM | POA: Diagnosis not present

## 2015-06-23 DIAGNOSIS — I5030 Unspecified diastolic (congestive) heart failure: Secondary | ICD-10-CM | POA: Diagnosis not present

## 2015-06-23 DIAGNOSIS — Z01818 Encounter for other preprocedural examination: Secondary | ICD-10-CM | POA: Diagnosis not present

## 2015-06-23 DIAGNOSIS — I10 Essential (primary) hypertension: Secondary | ICD-10-CM | POA: Diagnosis not present

## 2015-06-23 DIAGNOSIS — Z8673 Personal history of transient ischemic attack (TIA), and cerebral infarction without residual deficits: Secondary | ICD-10-CM | POA: Diagnosis not present

## 2015-06-23 DIAGNOSIS — I480 Paroxysmal atrial fibrillation: Secondary | ICD-10-CM | POA: Diagnosis not present

## 2015-06-27 ENCOUNTER — Encounter: Payer: Self-pay | Admitting: Family

## 2015-06-29 ENCOUNTER — Other Ambulatory Visit: Payer: Medicare Other

## 2015-06-29 ENCOUNTER — Ambulatory Visit: Payer: Medicare Other

## 2015-07-06 DIAGNOSIS — Z01810 Encounter for preprocedural cardiovascular examination: Secondary | ICD-10-CM | POA: Diagnosis not present

## 2015-07-06 DIAGNOSIS — I4891 Unspecified atrial fibrillation: Secondary | ICD-10-CM | POA: Diagnosis not present

## 2015-07-13 DIAGNOSIS — I350 Nonrheumatic aortic (valve) stenosis: Secondary | ICD-10-CM | POA: Diagnosis not present

## 2015-07-13 DIAGNOSIS — I5032 Chronic diastolic (congestive) heart failure: Secondary | ICD-10-CM | POA: Diagnosis not present

## 2015-07-13 DIAGNOSIS — K219 Gastro-esophageal reflux disease without esophagitis: Secondary | ICD-10-CM | POA: Diagnosis not present

## 2015-07-13 DIAGNOSIS — C169 Malignant neoplasm of stomach, unspecified: Secondary | ICD-10-CM | POA: Diagnosis not present

## 2015-07-13 DIAGNOSIS — I10 Essential (primary) hypertension: Secondary | ICD-10-CM | POA: Diagnosis not present

## 2015-07-13 DIAGNOSIS — I110 Hypertensive heart disease with heart failure: Secondary | ICD-10-CM | POA: Diagnosis not present

## 2015-07-13 DIAGNOSIS — C160 Malignant neoplasm of cardia: Secondary | ICD-10-CM | POA: Diagnosis not present

## 2015-07-13 DIAGNOSIS — I4891 Unspecified atrial fibrillation: Secondary | ICD-10-CM | POA: Diagnosis not present

## 2015-07-13 DIAGNOSIS — I509 Heart failure, unspecified: Secondary | ICD-10-CM | POA: Diagnosis not present

## 2015-07-13 DIAGNOSIS — I480 Paroxysmal atrial fibrillation: Secondary | ICD-10-CM | POA: Diagnosis not present

## 2015-07-13 DIAGNOSIS — Z8673 Personal history of transient ischemic attack (TIA), and cerebral infarction without residual deficits: Secondary | ICD-10-CM | POA: Diagnosis not present

## 2015-07-13 DIAGNOSIS — K449 Diaphragmatic hernia without obstruction or gangrene: Secondary | ICD-10-CM | POA: Diagnosis not present

## 2015-07-14 DIAGNOSIS — I5032 Chronic diastolic (congestive) heart failure: Secondary | ICD-10-CM | POA: Diagnosis not present

## 2015-07-14 DIAGNOSIS — K219 Gastro-esophageal reflux disease without esophagitis: Secondary | ICD-10-CM | POA: Diagnosis not present

## 2015-07-14 DIAGNOSIS — K449 Diaphragmatic hernia without obstruction or gangrene: Secondary | ICD-10-CM | POA: Diagnosis not present

## 2015-07-14 DIAGNOSIS — I480 Paroxysmal atrial fibrillation: Secondary | ICD-10-CM | POA: Diagnosis not present

## 2015-07-14 DIAGNOSIS — C160 Malignant neoplasm of cardia: Secondary | ICD-10-CM | POA: Diagnosis not present

## 2015-07-14 DIAGNOSIS — I110 Hypertensive heart disease with heart failure: Secondary | ICD-10-CM | POA: Diagnosis not present

## 2015-07-14 DIAGNOSIS — I10 Essential (primary) hypertension: Secondary | ICD-10-CM | POA: Diagnosis not present

## 2015-07-14 DIAGNOSIS — C169 Malignant neoplasm of stomach, unspecified: Secondary | ICD-10-CM | POA: Diagnosis not present

## 2015-07-14 DIAGNOSIS — I5030 Unspecified diastolic (congestive) heart failure: Secondary | ICD-10-CM | POA: Diagnosis not present

## 2015-07-19 ENCOUNTER — Telehealth: Payer: Self-pay | Admitting: *Deleted

## 2015-07-19 NOTE — Telephone Encounter (Signed)
Left VM for [name] to call back with update on how she is feeling and the status of her surgery at [facility].

## 2015-07-19 NOTE — Telephone Encounter (Addendum)
[name] returned call: they were able to remove the tumor endoscopically and told her the path report was good. She says she does not need to return to see him and does not need any further follow up. Per Dr. Saunders Revel (GI). Her surgery date was 07/13/15. Suggested she will needs some type of follow up to be vigilant for a new cancer. Will attempt to get info from [facility] Dr. Stephanie Acre [phone] and was redirected to his assistant's [phone] where I left my contact information and request for op note, pathology and discharge summary and follow needs. [name] added that she has been having diarrhea since Friday-has not taken anything. Suggested she push fluids and take Imodium. Contact her PCP to determine if he feels she needs to collect stool sample since she was recently in hospital.

## 2015-07-22 ENCOUNTER — Other Ambulatory Visit: Payer: Self-pay | Admitting: Family

## 2015-07-27 NOTE — Telephone Encounter (Signed)
Per Dr. Benay Spice; notified pt that Dr. Benay Spice recommends she follow up here with GI or @ [facility] because this cancer can come back.  Pt states she will call Dr. Marquis Buggy office and set up an appt within the month.  Pt verbalized understanding to call office if needed.

## 2015-08-05 ENCOUNTER — Ambulatory Visit: Payer: Self-pay | Admitting: General Practice

## 2015-08-05 ENCOUNTER — Ambulatory Visit (INDEPENDENT_AMBULATORY_CARE_PROVIDER_SITE_OTHER): Payer: Medicare Other | Admitting: General Practice

## 2015-08-05 DIAGNOSIS — Z86711 Personal history of pulmonary embolism: Secondary | ICD-10-CM

## 2015-08-05 DIAGNOSIS — Z5181 Encounter for therapeutic drug level monitoring: Secondary | ICD-10-CM | POA: Diagnosis not present

## 2015-08-05 LAB — POCT INR: INR: 3.2

## 2015-08-05 NOTE — Progress Notes (Signed)
I have reviewed and agree with the plan. 

## 2015-08-06 ENCOUNTER — Ambulatory Visit: Payer: Medicare Other

## 2015-08-09 NOTE — Progress Notes (Signed)
Pre visit review using our clinic review tool, if applicable. No additional management support is needed unless otherwise documented below in the visit note. 

## 2015-08-11 DIAGNOSIS — I10 Essential (primary) hypertension: Secondary | ICD-10-CM | POA: Diagnosis not present

## 2015-08-11 DIAGNOSIS — I4891 Unspecified atrial fibrillation: Secondary | ICD-10-CM | POA: Diagnosis not present

## 2015-08-11 DIAGNOSIS — K219 Gastro-esophageal reflux disease without esophagitis: Secondary | ICD-10-CM | POA: Diagnosis not present

## 2015-08-11 DIAGNOSIS — Z08 Encounter for follow-up examination after completed treatment for malignant neoplasm: Secondary | ICD-10-CM | POA: Diagnosis not present

## 2015-08-11 DIAGNOSIS — Z7901 Long term (current) use of anticoagulants: Secondary | ICD-10-CM | POA: Diagnosis not present

## 2015-08-11 DIAGNOSIS — R0789 Other chest pain: Secondary | ICD-10-CM | POA: Diagnosis not present

## 2015-08-11 DIAGNOSIS — C160 Malignant neoplasm of cardia: Secondary | ICD-10-CM | POA: Diagnosis not present

## 2015-08-11 DIAGNOSIS — Z79899 Other long term (current) drug therapy: Secondary | ICD-10-CM | POA: Diagnosis not present

## 2015-08-11 DIAGNOSIS — G8918 Other acute postprocedural pain: Secondary | ICD-10-CM | POA: Diagnosis not present

## 2015-08-11 NOTE — Progress Notes (Signed)
I have reviewed and agree with the plan. 

## 2015-08-18 ENCOUNTER — Telehealth: Payer: Self-pay | Admitting: *Deleted

## 2015-08-18 ENCOUNTER — Other Ambulatory Visit: Payer: Self-pay | Admitting: Family

## 2015-08-18 NOTE — Telephone Encounter (Signed)
error 

## 2015-08-18 NOTE — Telephone Encounter (Signed)
Faxed script back to [facility]...[name]

## 2015-08-20 ENCOUNTER — Telehealth: Payer: Self-pay | Admitting: *Deleted

## 2015-08-20 MED ORDER — PANTOPRAZOLE SODIUM 40 MG PO TBEC: 40.0000 mg | DELAYED_RELEASE_TABLET | Freq: Two times a day (BID) | ORAL | Status: DC

## 2015-08-20 NOTE — Telephone Encounter (Signed)
Received call stating she was taking pantoprazole twice a day pharmacy is needing updated script Inform pt will send electronically to [facility].Marland KitchenJohny Chess

## 2015-09-03 ENCOUNTER — Ambulatory Visit (INDEPENDENT_AMBULATORY_CARE_PROVIDER_SITE_OTHER): Payer: Medicare Other | Admitting: General Practice

## 2015-09-03 ENCOUNTER — Telehealth: Payer: Self-pay | Admitting: Family

## 2015-09-03 DIAGNOSIS — Z5181 Encounter for therapeutic drug level monitoring: Secondary | ICD-10-CM | POA: Diagnosis not present

## 2015-09-03 DIAGNOSIS — I480 Paroxysmal atrial fibrillation: Secondary | ICD-10-CM

## 2015-09-03 DIAGNOSIS — Z86711 Personal history of pulmonary embolism: Secondary | ICD-10-CM | POA: Diagnosis not present

## 2015-09-03 LAB — POCT INR: INR: 3.7

## 2015-09-03 MED ORDER — GABAPENTIN 100 MG PO CAPS: ORAL_CAPSULE | ORAL | Status: DC

## 2015-09-03 NOTE — Progress Notes (Signed)
Pre visit review using our clinic review tool, if applicable. No additional management support is needed unless otherwise documented below in the visit note. 

## 2015-09-03 NOTE — Telephone Encounter (Signed)
Patient is requesting a refill of gabapentin (NEURONTIN) 100 MG capsule [id number]  Sent to [facility]

## 2015-09-03 NOTE — Progress Notes (Signed)
I have reviewed and agree with the plan. 

## 2015-09-03 NOTE — Telephone Encounter (Signed)
Medication refilled

## 2015-09-05 ENCOUNTER — Encounter (HOSPITAL_COMMUNITY): Payer: Self-pay | Admitting: *Deleted

## 2015-09-05 ENCOUNTER — Emergency Department (HOSPITAL_COMMUNITY): Payer: Medicare Other

## 2015-09-05 ENCOUNTER — Observation Stay (HOSPITAL_COMMUNITY)
Admission: EM | Admit: 2015-09-05 | Discharge: 2015-09-06 | Disposition: A | Discharge status: Home / Self Care | Payer: Medicare Other | Attending: Internal Medicine | Admitting: Internal Medicine

## 2015-09-05 DIAGNOSIS — R079 Chest pain, unspecified: Principal | ICD-10-CM | POA: Diagnosis present

## 2015-09-05 DIAGNOSIS — M1990 Unspecified osteoarthritis, unspecified site: Secondary | ICD-10-CM | POA: Insufficient documentation

## 2015-09-05 DIAGNOSIS — G8929 Other chronic pain: Secondary | ICD-10-CM | POA: Insufficient documentation

## 2015-09-05 DIAGNOSIS — Z8673 Personal history of transient ischemic attack (TIA), and cerebral infarction without residual deficits: Secondary | ICD-10-CM | POA: Diagnosis not present

## 2015-09-05 DIAGNOSIS — R0789 Other chest pain: Secondary | ICD-10-CM | POA: Diagnosis not present

## 2015-09-05 DIAGNOSIS — I5032 Chronic diastolic (congestive) heart failure: Secondary | ICD-10-CM

## 2015-09-05 DIAGNOSIS — Z8501 Personal history of malignant neoplasm of esophagus: Secondary | ICD-10-CM | POA: Diagnosis not present

## 2015-09-05 DIAGNOSIS — I509 Heart failure, unspecified: Secondary | ICD-10-CM | POA: Insufficient documentation

## 2015-09-05 DIAGNOSIS — Z7901 Long term (current) use of anticoagulants: Secondary | ICD-10-CM | POA: Insufficient documentation

## 2015-09-05 DIAGNOSIS — K219 Gastro-esophageal reflux disease without esophagitis: Secondary | ICD-10-CM | POA: Insufficient documentation

## 2015-09-05 DIAGNOSIS — Z79899 Other long term (current) drug therapy: Secondary | ICD-10-CM | POA: Diagnosis not present

## 2015-09-05 DIAGNOSIS — Z8781 Personal history of (healed) traumatic fracture: Secondary | ICD-10-CM | POA: Diagnosis not present

## 2015-09-05 DIAGNOSIS — Z86711 Personal history of pulmonary embolism: Secondary | ICD-10-CM | POA: Insufficient documentation

## 2015-09-05 DIAGNOSIS — I480 Paroxysmal atrial fibrillation: Secondary | ICD-10-CM | POA: Diagnosis present

## 2015-09-05 DIAGNOSIS — K449 Diaphragmatic hernia without obstruction or gangrene: Secondary | ICD-10-CM | POA: Diagnosis not present

## 2015-09-05 DIAGNOSIS — I499 Cardiac arrhythmia, unspecified: Secondary | ICD-10-CM | POA: Insufficient documentation

## 2015-09-05 DIAGNOSIS — C160 Malignant neoplasm of cardia: Secondary | ICD-10-CM | POA: Diagnosis present

## 2015-09-05 DIAGNOSIS — I5030 Unspecified diastolic (congestive) heart failure: Secondary | ICD-10-CM | POA: Diagnosis present

## 2015-09-05 DIAGNOSIS — F419 Anxiety disorder, unspecified: Secondary | ICD-10-CM | POA: Insufficient documentation

## 2015-09-05 DIAGNOSIS — I10 Essential (primary) hypertension: Secondary | ICD-10-CM | POA: Diagnosis not present

## 2015-09-05 DIAGNOSIS — I4891 Unspecified atrial fibrillation: Secondary | ICD-10-CM | POA: Diagnosis not present

## 2015-09-05 DIAGNOSIS — M25511 Pain in right shoulder: Secondary | ICD-10-CM | POA: Diagnosis not present

## 2015-09-05 LAB — BASIC METABOLIC PANEL
ANION GAP: 12 (ref 5–15)
BUN: 7 mg/dL (ref 6–20)
CO2: 24 mmol/L (ref 22–32)
Calcium: 9 mg/dL (ref 8.9–10.3)
Chloride: 105 mmol/L (ref 101–111)
Creatinine, Ser: 0.69 mg/dL (ref 0.44–1.00)
GFR calc Af Amer: >60 mL/min (ref >60)
GLUCOSE: 100 mg/dL — AB (ref 65–99)
POTASSIUM: 3.9 mmol/L (ref 3.5–5.1)
Sodium: 141 mmol/L (ref 135–145)

## 2015-09-05 LAB — LIPID PANEL
CHOL/HDL RATIO: 3.7 ratio
CHOLESTEROL: 173 mg/dL (ref 0–200)
HDL: 47 mg/dL (ref >40)
LDL Cholesterol: 93 mg/dL (ref 0–99)
Triglycerides: 167 mg/dL — ABNORMAL HIGH (ref <150)
VLDL: 33 mg/dL (ref 0–40)

## 2015-09-05 LAB — HEPATIC FUNCTION PANEL
ALBUMIN: 3.9 g/dL (ref 3.5–5.0)
ALT: 17 U/L (ref 14–54)
AST: 22 U/L (ref 15–41)
Alkaline Phosphatase: 84 U/L (ref 38–126)
Bilirubin, Direct: 0.1 mg/dL (ref 0.1–0.5)
Indirect Bilirubin: 0.1 mg/dL — ABNORMAL LOW (ref 0.3–0.9)
TOTAL PROTEIN: 6.6 g/dL (ref 6.5–8.1)
Total Bilirubin: 0.2 mg/dL — ABNORMAL LOW (ref 0.3–1.2)

## 2015-09-05 LAB — PROTIME-INR
INR: 2.86 — AB (ref 0.00–1.49)
PROTHROMBIN TIME: 29.5 s — AB (ref 11.6–15.2)

## 2015-09-05 LAB — I-STAT TROPONIN, ED: Troponin i, poc: 0.01 ng/mL (ref 0.00–0.08)

## 2015-09-05 LAB — CBC
HEMATOCRIT: 39.1 % (ref 36.0–46.0)
HEMOGLOBIN: 12.9 g/dL (ref 12.0–15.0)
MCH: 27 pg (ref 26.0–34.0)
MCHC: 33 g/dL (ref 30.0–36.0)
MCV: 81.8 fL (ref 78.0–100.0)
Platelets: 158 10*3/uL (ref 150–400)
RBC: 4.78 MIL/uL (ref 3.87–5.11)
RDW: 13.3 % (ref 11.5–15.5)
WBC: 5.6 10*3/uL (ref 4.0–10.5)

## 2015-09-05 LAB — TROPONIN I: Troponin I: 0.03 ng/mL (ref <0.031)

## 2015-09-05 LAB — LIPASE, BLOOD: Lipase: 21 U/L (ref 11–51)

## 2015-09-05 MED ORDER — ENOXAPARIN SODIUM 40 MG/0.4ML ~~LOC~~ SOLN: 40.0000 mg | SUBCUTANEOUS | Status: DC

## 2015-09-05 MED ORDER — ACETAMINOPHEN 325 MG PO TABS
650.0000 mg | ORAL_TABLET | ORAL | Status: DC | PRN
  Administered 2015-09-05 – 2015-09-06 (×2): 650 mg via ORAL
  Filled 2015-09-05 (×2): qty 2

## 2015-09-05 MED ORDER — TEMAZEPAM 15 MG PO CAPS
15.0000 mg | ORAL_CAPSULE | Freq: Every day | ORAL | Status: DC
  Administered 2015-09-05: 15 mg via ORAL
  Filled 2015-09-05: qty 1

## 2015-09-05 MED ORDER — GI COCKTAIL ~~LOC~~: 30.0000 mL | Freq: Four times a day (QID) | ORAL | Status: DC | PRN

## 2015-09-05 MED ORDER — TRAMADOL HCL 50 MG PO TABS: 50.0000 mg | ORAL_TABLET | Freq: Three times a day (TID) | ORAL | Status: DC | PRN

## 2015-09-05 MED ORDER — MORPHINE SULFATE (PF) 2 MG/ML IV SOLN
2.0000 mg | INTRAVENOUS | Status: DC | PRN
  Administered 2015-09-06: 2 mg via INTRAVENOUS
  Filled 2015-09-05: qty 1

## 2015-09-05 MED ORDER — ONDANSETRON HCL 4 MG/2ML IJ SOLN: 4.0000 mg | Freq: Four times a day (QID) | INTRAMUSCULAR | Status: DC | PRN

## 2015-09-05 MED ORDER — GABAPENTIN 300 MG PO CAPS
300.0000 mg | ORAL_CAPSULE | Freq: Three times a day (TID) | ORAL | Status: DC
  Administered 2015-09-05 – 2015-09-06 (×3): 300 mg via ORAL
  Filled 2015-09-05 (×3): qty 1

## 2015-09-05 MED ORDER — MECLIZINE HCL 25 MG PO TABS
25.0000 mg | ORAL_TABLET | Freq: Three times a day (TID) | ORAL | Status: DC | PRN
  Administered 2015-09-06: 25 mg via ORAL
  Filled 2015-09-05: qty 1

## 2015-09-05 MED ORDER — ASPIRIN 325 MG PO TABS
325.0000 mg | ORAL_TABLET | Freq: Every day | ORAL | Status: DC
  Filled 2015-09-05: qty 1

## 2015-09-05 MED ORDER — IRBESARTAN 150 MG PO TABS
150.0000 mg | ORAL_TABLET | Freq: Every day | ORAL | Status: DC
  Administered 2015-09-06: 150 mg via ORAL
  Filled 2015-09-05: qty 1

## 2015-09-05 MED ORDER — VITAMIN B-12 1000 MCG PO TABS
1000.0000 ug | ORAL_TABLET | Freq: Every day | ORAL | Status: DC
  Administered 2015-09-06: 1000 ug via ORAL
  Filled 2015-09-05: qty 1

## 2015-09-05 MED ORDER — AMLODIPINE BESYLATE 10 MG PO TABS
10.0000 mg | ORAL_TABLET | Freq: Every day | ORAL | Status: DC
  Administered 2015-09-06: 10 mg via ORAL
  Filled 2015-09-05: qty 1

## 2015-09-05 MED ORDER — SIMVASTATIN 20 MG PO TABS
20.0000 mg | ORAL_TABLET | Freq: Every day | ORAL | Status: DC
Start: 2015-09-05 — End: 2015-09-06
  Administered 2015-09-05: 20 mg via ORAL
  Filled 2015-09-05: qty 1

## 2015-09-05 MED ORDER — ONDANSETRON HCL 4 MG PO TABS: 4.0000 mg | ORAL_TABLET | Freq: Three times a day (TID) | ORAL | Status: DC | PRN

## 2015-09-05 MED ORDER — HYDROCHLOROTHIAZIDE 12.5 MG PO CAPS
12.5000 mg | ORAL_CAPSULE | Freq: Every day | ORAL | Status: DC
  Administered 2015-09-06: 12.5 mg via ORAL
  Filled 2015-09-05: qty 1

## 2015-09-05 MED ORDER — WARFARIN - PHARMACIST DOSING INPATIENT: Freq: Every day | Status: DC

## 2015-09-05 MED ORDER — ONDANSETRON HCL 4 MG/2ML IJ SOLN
4.0000 mg | Freq: Once | INTRAMUSCULAR | Status: AC
  Administered 2015-09-05: 4 mg via INTRAVENOUS
  Filled 2015-09-05: qty 2

## 2015-09-05 MED ORDER — PANTOPRAZOLE SODIUM 40 MG PO TBEC
40.0000 mg | DELAYED_RELEASE_TABLET | Freq: Two times a day (BID) | ORAL | Status: DC
  Administered 2015-09-05 – 2015-09-06 (×2): 40 mg via ORAL
  Filled 2015-09-05 (×2): qty 1

## 2015-09-05 MED ORDER — NITROGLYCERIN 0.4 MG SL SUBL
0.4000 mg | SUBLINGUAL_TABLET | SUBLINGUAL | Status: DC | PRN
  Administered 2015-09-05: 0.4 mg via SUBLINGUAL
  Filled 2015-09-05: qty 1

## 2015-09-05 MED ORDER — CLONIDINE HCL 0.1 MG PO TABS: 0.1000 mg | ORAL_TABLET | Freq: Every day | ORAL | Status: DC | PRN

## 2015-09-05 MED ORDER — CLONIDINE HCL 0.1 MG PO TABS
0.1000 mg | ORAL_TABLET | Freq: Once | ORAL | Status: AC
  Administered 2015-09-05: 0.1 mg via ORAL
  Filled 2015-09-05: qty 1

## 2015-09-05 MED ORDER — METOPROLOL TARTRATE 50 MG PO TABS
50.0000 mg | ORAL_TABLET | Freq: Two times a day (BID) | ORAL | Status: DC
  Filled 2015-09-05: qty 1

## 2015-09-05 MED ORDER — VALSARTAN-HYDROCHLOROTHIAZIDE 320-25 MG PO TABS: 0.5000 | ORAL_TABLET | Freq: Every day | ORAL | Status: DC

## 2015-09-05 MED ORDER — WARFARIN SODIUM 2.5 MG PO TABS: 2.5000 mg | ORAL_TABLET | ORAL | Status: DC

## 2015-09-05 MED ORDER — WARFARIN SODIUM 5 MG PO TABS
5.0000 mg | ORAL_TABLET | ORAL | Status: DC
  Administered 2015-09-05: 5 mg via ORAL
  Filled 2015-09-05 (×2): qty 1

## 2015-09-05 MED ORDER — METOPROLOL TARTRATE 25 MG PO TABS: 50.0000 mg | ORAL_TABLET | Freq: Two times a day (BID) | ORAL | Status: DC

## 2015-09-05 MED ORDER — GI COCKTAIL ~~LOC~~
30.0000 mL | Freq: Once | ORAL | Status: AC
  Administered 2015-09-05: 30 mL via ORAL
  Filled 2015-09-05: qty 30

## 2015-09-05 NOTE — ED Notes (Signed)
Family at bedside. 

## 2015-09-05 NOTE — Progress Notes (Signed)
ANTICOAGULATION CONSULT NOTE - Initial Consult  Pharmacy Consult for Coumadin Indication: atrial fibrillation  Allergies  Allergen Reactions  . Iodinated Diagnostic Agents Anaphylaxis    IPD dye Info given by patient  . Ioxaglate Anaphylaxis    Info given by patient  . Milk-Related Compounds Anaphylaxis    Lactose intolerance  . Whey     Lactose intolerance  . Darvon [Propoxyphene] Rash  . Hydralazine Anxiety    Facial flushing, pt prefers not to use it.     Patient Measurements: Height: 5\' 2"  (157.5 cm) Weight: 130 lb (58.968 kg) IBW/kg (Calculated) : 50.1  Vital Signs: Temp: 98.6 F (37 C) (03/05 1218) Temp Source: Oral (03/05 1218) BP: 192/115 mmHg (03/05 1500) Pulse Rate: 50 (03/05 1500)  Labs:  Recent Labs  09/03/15 09/05/15 1334  HGB  --  12.9  HCT  --  39.1  PLT  --  158  LABPROT  --  29.5*  INR 3.7 2.86*  CREATININE  --  0.69    Estimated Creatinine Clearance: 47.3 mL/min (by C-G formula based on Cr of 0.69).   Medical History: Past Medical History  Diagnosis Date  . CHF (congestive heart failure) ([place])   . Hypertension   . Broken back 2013    chronic back pain.   . Atrial fibrillation ([place])   . GERD (gastroesophageal reflux disease)   . Arthritis   . Stroke ([place])   . Pulmonary emboli ([place]) 2008, 2012  . Anemia 2005    generally microcytic, transfusions in [address], 2012, 02/2015, 05/2015  . HH (hiatus hernia) 2008    large with associated erosions.   . Allergy   . 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 PONV (postoperative nausea and vomiting)   . Transfusion history     2 units transfused 05-06-15([facility])  . H/O iron deficiency     05-06-15 iron infusion ([facility])    Assessment: 75 year old female admitted with chest pain / palpitations to resume PTA Coumadin for Afib  Home dose = 5 mg daily except 2.5 mg on Wednesdays  Goal of Therapy:  INR 2-3 Monitor platelets by anticoagulation protocol: Yes   Plan:   Resume home dose  Daily INR for now  Thank you Anette Guarneri, PharmD [phone]  Tad Moore 09/05/2015,3:47 PM

## 2015-09-05 NOTE — ED Notes (Signed)
Pt presents via [facility] from church c/o sudden onset chest pain, midsternal radiation to right shoulder with SOB.  HX: Afib, on EMS arrival pt noted to be in Afib HR 90-140. Pt doesn't remember is she took her morning medication before church.  324 ASA given, 1 NTG given with a decrease in pain.  BP-162/65 P-90 R-18 O2-97% RA.   Pt a x 4, NAD.

## 2015-09-05 NOTE — H&P (Signed)
[facility] History and Physical  THETIS SPARLING [id number] DOB: 01/03/1941 DOA: 09/05/2015  Referring physician: Ralene Bathe PCP: Mauricio Po, [name]   Chief Complaint: palpitations/chest pain  HPI: [name] is a very pleasant 75 y.o. female past medical history that includes A. fib she is on Coumadin, hypertension, chronic diastolic heart failure, GE junction carcinoma status post endoscopy for treatment presents to emergency Department chief complaint of palpitations and chest pain. Initial evaluation somewhat concerning for ACS and reveals uncontrolled hypertension A. fib with a heart rate of 101.  Patient is obtained from the patient. She states she no usual state of health she awakened this morning was getting ready for church developed sudden onset "palpitations". Associated symptoms include mild shortness of breath. She church and shortly thereafter developed left anterior chest "pressure". She states this chest discomfort radiated to her jaw. She rates the pressure 5 out of 10. She didn't develop nausea with diaphoresis no vomiting. He denies headache visual disturbances syncope or near-syncope. She denies numbing or tingling of her extremities. She denies abdominal pain dysuria hematuria frequency or urgency. He denies cough fever chills or recent sick contacts or travel.  Urgency department she is provided with nitroglycerin and clonidine. She is afebrile, hypertensive with a heart rate of 93. She is not hypoxic  Per Dr. Ralene Bathe case discussed with cardiology Dr. Arlyce Dice recommended medical admission cardiology consult tomorrow if needed.  Review of Systems:  And point review of systems complete and all systems are negative except as indicated in the history of present illness   Past Medical History  Diagnosis Date  . CHF (congestive heart failure) ([place])   . Hypertension   . Broken back 2013    chronic back pain.   . Atrial fibrillation ([place])   . GERD (gastroesophageal  reflux disease)   . Arthritis   . Stroke ([place])   . Pulmonary emboli ([place]) 2008, 2012  . Anemia 2005    generally microcytic, transfusions in [address], 2012, 02/2015, 05/2015  . HH (hiatus hernia) 2008    large with associated erosions.   . Allergy   . Esophageal cancer ([place]) 05/06/15    adenocarcinoma ge junction  . PONV (postoperative nausea and vomiting)   . Transfusion history     2 units transfused 05-06-15([facility])  . H/O iron deficiency     05-06-15 iron infusion ([facility])   Past Surgical History  Procedure Laterality Date  . Lumbar disc surgery  1991  . Lumbar back surgery  2012  . Cervical disc surgery  2014  . Cholecystectomy  1980s    open  . Appendectomy  1950s  . Tonsillectomy and adenoidectomy  1960s  . Carpal tunnel release Bilateral 1990s  . Esophagogastroduodenoscopy N/A 02/16/2015    Procedure: ESOPHAGOGASTRODUODENOSCOPY (EGD);  Surgeon: [name], MD;  Location: [facility] END[facility]COPY;  Service: Endoscopy;  Laterality: N/A;  . Colonoscopy N/A 02/17/2015    Procedure: COLONOSCOPY;  Surgeon: [name], MD;  Location: [facility] ENDOSCOPY;  Service: Endoscopy;  Laterality: N/A;  . Exploratory lab  1950s or 1960s  . Esophagogastroduodenoscopy N/A 05/06/2015    Procedure: ESOPHAGOGASTRODUODENOSCOPY (EGD);  Surgeon: [name], MD;  Location: [facility] ENDOSCOPY;  Service: Endoscopy;  Laterality: N/A;  . Givens capsule study N/A 05/06/2015    Procedure: GIVENS CAPSULE STUDY;  Surgeon: [name], MD;  Location: [facility] ENDOSCOPY;  Service: Endoscopy;  Laterality: N/A;  . Eus N/A 05/20/2015    Procedure: UPPER ENDOSCOPIC ULTRASOUND (EUS) LINEAR;  Surgeon: Melene Plan  Ardis Hughs, MD;  Location: [facility] ENDOSCOPY;  Service: Endoscopy;  Laterality: N/A;   Social History:  reports that she has never smoked. She has never used smokeless tobacco. She reports that she does not drink alcohol or use illicit drugs.  Allergies  Allergen Reactions  . Iodinated Diagnostic Agents Anaphylaxis    IPD dye Info given by  patient  . Ioxaglate Anaphylaxis    Info given by patient  . Milk-Related Compounds Anaphylaxis    Lactose intolerance  . Whey     Lactose intolerance  . Darvon [Propoxyphene] Rash  . Hydralazine Anxiety    Facial flushing, pt prefers not to use it.     Family History  Problem Relation Age of Onset  . Stroke Mother   . Heart disease Mother   . Emphysema Father   . Ovarian cancer Sister   . Stroke Sister   . Other Child     died at birth     Prior to Admission medications   Medication Sig Start Date End Date Taking? Authorizing Provider  acetaminophen (TYLENOL) 500 MG tablet Take 1,000 mg by mouth at bedtime as needed for moderate pain.   Yes Historical Provider, MD  amLODipine (NORVASC) 10 MG tablet TAKE ONE TABLET BY MOUTH ONCE DAILY. 06/16/15  Yes Golden Circle, [name]  cloNIDine (CATAPRES) 0.1 MG tablet Take 1 tablet (0.1 mg total) by mouth daily as needed (high blood pressure, will take if levels are 180/90). Patient taking differently: Take 0.1 mg by mouth daily as needed (high blood pressure, take if blood pressure is >179/99.).  05/08/15  Yes Jessica U Vann, DO  furosemide (LASIX) 40 MG tablet TAKE 1 TO 2 TABLETS BY MOUTH ONCE DAILY. Patient taking differently: TAKE 1 TO 2 TABLETS BY MOUTH ONCE DAILY as needed for fluid 04/09/15  Yes Golden Circle, [name]  gabapentin (NEURONTIN) 100 MG capsule TAKE 1 CAPSULE BY MOUTH THREE TIMES A DAY. 09/03/15  Yes Golden Circle, [name]  meclizine (ANTIVERT) 25 MG tablet Take 1 tablet (25 mg total) by mouth 3 (three) times daily as needed for dizziness. 08/28/14  Yes Bonnielee Haff, MD  metoprolol (LOPRESSOR) 50 MG tablet TAKE 1 TABLET BY MOUTH TWICE DAILY. 06/16/15  Yes Golden Circle, [name]  NONFORMULARY OR COMPOUNDED ITEM Diclofenac/Baclofen/Cyclobenzaprine/Gabapentin/Lidocaine 3/2/2/6/2.5% Cream  Apply to affected area 2-3 times per day as needed for arthritic pain. Patient taking differently: Apply 1 application topically daily as needed  (for arthritis). Diclofenac/Baclofen/Cyclobenzaprine/Gabapentin/Lidocaine 3/2/2/6/2.5% Cream  Apply to affected area 2-3 times per day as needed for arthritic pain. 04/16/15  Yes Golden Circle, [name]  ondansetron (ZOFRAN) 4 MG tablet Take 4 mg by mouth every 8 (eight) hours as needed for nausea or vomiting.   Yes Historical Provider, MD  pantoprazole (PROTONIX) 40 MG tablet Take 1 tablet (40 mg total) by mouth 2 (two) times daily. 08/20/15  Yes Golden Circle, [name]  simvastatin (ZOCOR) 20 MG tablet Take 20 mg by mouth at bedtime.   Yes Historical Provider, MD  temazepam (RESTORIL) 15 MG capsule TAKE 1 CAPSULE BY MOUTH AT BEDTIME. 08/18/15  Yes Golden Circle, [name]  traMADol (ULTRAM) 50 MG tablet Take 1 tablet (50 mg total) by mouth every 8 (eight) hours as needed for moderate pain. 04/16/15  Yes Golden Circle, [name]  valsartan-hydrochlorothiazide (DIOVAN-HCT) 320-25 MG tablet Take 0.5 tablets by mouth daily.   Yes Historical Provider, MD  vitamin B-12 1000 MCG tablet Take 1 tablet (1,000 mcg total) by mouth daily.  05/08/15  Yes Geradine Girt, DO  warfarin (COUMADIN) 5 MG tablet Take 5 mg by mouth daily at 6 PM. 2.5 mg on Wednesday, 5 mg all other days   Yes Historical Provider, MD  oxycodone (OXY-IR) 5 MG capsule Take 1 capsule (5 mg total) by mouth 2 (two) times daily as needed for pain. Patient not taking: Reported on 09/05/2015 04/16/15   Golden Circle, [name]   Physical Exam: Filed Vitals:   09/05/15 1400 09/05/15 1415 09/05/15 1430 09/05/15 1500  BP: 162/70 175/61 198/66 192/115  Pulse: 81 89 65 50  Temp:      TempSrc:      Resp: 13 19 17 18   Height:      Weight:      SpO2: 99% 98% 97% 99%    Wt Readings from Last 3 Encounters:  09/05/15 58.968 kg (130 lb)  06/01/15 60.238 kg (132 lb 12.8 oz)  05/26/15 60.328 kg (133 lb)    General:  Appears calm and comfortable Eyes: PERRL, normal lids, irises & conjunctiva ENT: grossly normal hearing, lips & tongue Neck: no LAD, masses or  thyromegaly Cardiovascular: Irregularly irregular, no m/r/g. No LE edema. Telemetry: SR, no arrhythmias  Respiratory: CTA bilaterally, no w/r/r. Normal respiratory effort. Abdomen: soft, ntnd no guarding or rebounding Skin: no rash or induration seen on limited exam Musculoskeletal: grossly normal tone BUE/BLE Psychiatric: grossly normal mood and affect, speech fluent and appropriate Neurologic: grossly non-focal. Speech slow and deliberate but clear           Labs on Admission:  Basic Metabolic Panel:  Recent Labs Lab 09/05/15 1334  NA 141  K 3.9  CL 105  CO2 24  GLUCOSE 100*  BUN 7  CREATININE 0.69  CALCIUM 9.0   Liver Function Tests:  Recent Labs Lab 09/05/15 1334  AST 22  ALT 17  ALKPHOS 84  BILITOT 0.2*  PROT 6.6  ALBUMIN 3.9    Recent Labs Lab 09/05/15 1334  LIPASE 21   No results for input(s): AMMONIA in the last 168 hours. CBC:  Recent Labs Lab 09/05/15 1334  WBC 5.6  HGB 12.9  HCT 39.1  MCV 81.8  PLT 158   Cardiac Enzymes: No results for input(s): CKTOTAL, CKMB, CKMBINDEX, TROPONINI in the last 168 hours.  BNP (last 3 results)  Recent Labs  02/12/15 2130 05/05/15 0303  BNP 394.8* 103.7*    ProBNP (last 3 results) No results for input(s): PROBNP in the last 8760 hours.  CBG: No results for input(s): GLUCAP in the last 168 hours.  Radiological Exams on Admission: Dg Chest 2 View  09/05/2015  CLINICAL DATA:  Chest pain EXAM: CHEST  2 VIEW COMPARISON:  02/12/2015 FINDINGS: Normal heart size. There is aortic atherosclerosis. Large hiatal hernia is identified. No pleural effusion or edema identified. Bronchial wall thickening is noted bilaterally. IMPRESSION: 1. No acute cardiopulmonary abnormalities. 2. Hiatal hernia 3. Aortic atherosclerosis Electronically Signed   By: Kerby Moors M.D.   On: 09/05/2015 13:21    EKG: Independently reviewed. Atrial fibrillation Minimal ST depression, diffuse leads  Assessment/Plan Principal  Problem:   Chest pain at rest Active Problems:   PAF (paroxysmal atrial fibrillation) ([name])   Chronic diastolic (congestive) heart failure ([name])   Chest pain   Hypertension   GE junction carcinoma ([place])  #1. Chest pain/palpitations at rest in setting of uncontrolled blood pressure.  History of A. fib on Coumadin. Heart score4. She reports not taking all of her medications this  morning but is unsure of which one she skipped. Home medications include amlodipine, clonidine, Toprol. Initial troponin negative. EKG without acute changes. Recent stress test in [facility] within the limits of normal. Case discussed with Dr. Luther Parody per ED M.D. who recommends medical admission and cardiology consult tomorrow if needed -Admit to telemetry -Cycle troponins -Obtain serial EKG -Lipid panel -Provide aspirin -Continue statin   #2. Hypertension. Poor control in the emergency department. Patient skipped some of her morning medications but is unsure of which ones. Blood pressure in the emergency department 192/115. Was given clonidine in the emergency department. -We will resume amlodipine, clonidine, metoprolol, losartan and HCTZ -Monitor blood pressure closely  #3. PAF. Rate controlled. Patient experienced palpitations earlier today much improved at admission. [place] score 3. INR 2.86 -We'll resume metoprolol -Monitor closely -10 per pharmacy  #4. Chronic diastolic heart failure. Appears compensated. Echo in August 2016 with an EF of 60%. Meds include when necessary Lasix and Toprol and HCTZ -Daily weights -Intake and output -Continue beta blocker and ARB and HCTZ  5. GE junction carcinoma. Curative procedure in [place] performed January 2017.   Code Status: full DVT Prophylaxis: Family Communication: none present Disposition Plan: Home hopefully 24 hours  Time spent: 76 minutes  [facility]

## 2015-09-05 NOTE — ED Notes (Addendum)
Lab draw attempt by RN, unsuccessful.  2nd RN to attempt

## 2015-09-05 NOTE — ED Notes (Addendum)
BP dropped 170s sys to 120s sys, No change in per with NTG, hold 2nd per MD Ralene Bathe.  Repeat EKG completed and given to MD.   Pt also now c/o jaw pain, MD aware

## 2015-09-05 NOTE — ED Provider Notes (Signed)
CSN: [id number]     Arrival date & time 09/05/15  1212 History   First MD Initiated Contact with Patient 09/05/15 1214     Chief Complaint  Patient presents with  . Atrial Fibrillation  . Chest Pain     Patient is a 75 y.o. female presenting with atrial fibrillation and chest pain. The history is provided by the patient. No language interpreter was used.  Atrial Fibrillation Associated symptoms include chest pain.  Chest Pain  LOLLIE LACEK is a 75 y.o. female who presents to the Emergency Department complaining of chest pain.  Sxs started this morning.  She reports sudden onset right sided chest pain that is described as a constant, pressure type sensation.  She has associated pain in her right arm and SOB.  She denies fever, vomiting, abdominal pain, leg swelling or pain.  Sxs feel similar to prior episodes of atrial fibrillation.  She received ASA and NTG by EMS prior to ED arrival and her pain has improved since then.    Past Medical History  Diagnosis Date  . CHF (congestive heart failure) ([place])   . Hypertension   . Broken back 2013    chronic back pain.   . Atrial fibrillation ([place])   . GERD (gastroesophageal reflux disease)   . Arthritis   . Stroke ([place])   . Pulmonary emboli ([place]) 2008, 2012  . Anemia 2005    generally microcytic, transfusions in [address], 2012, 02/2015, 05/2015  . HH (hiatus hernia) 2008    large with associated erosions.   . Allergy   . Esophageal cancer ([place]) 05/06/15    adenocarcinoma ge junction  . PONV (postoperative nausea and vomiting)   . Transfusion history     2 units transfused 05-06-15([facility])  . H/O iron deficiency     05-06-15 iron infusion ([facility])   Past Surgical History  Procedure Laterality Date  . Lumbar disc surgery  1991  . Lumbar back surgery  2012  . Cervical disc surgery  2014  . Cholecystectomy  1980s    open  . Appendectomy  1950s  . Tonsillectomy and adenoidectomy  1960s  . Carpal tunnel release Bilateral 1990s  .  Esophagogastroduodenoscopy N/A 02/16/2015    Procedure: ESOPHAGOGASTRODUODENOSCOPY (EGD);  Surgeon: [name], MD;  Location: [facility] END[facility]COPY;  Service: Endoscopy;  Laterality: N/A;  . Colonoscopy N/A 02/17/2015    Procedure: COLONOSCOPY;  Surgeon: [name], MD;  Location: [facility] ENDOSCOPY;  Service: Endoscopy;  Laterality: N/A;  . Exploratory lab  1950s or 1960s  . Esophagogastroduodenoscopy N/A 05/06/2015    Procedure: ESOPHAGOGASTRODUODENOSCOPY (EGD);  Surgeon: [name], MD;  Location: [facility] ENDOSCOPY;  Service: Endoscopy;  Laterality: N/A;  . Givens capsule study N/A 05/06/2015    Procedure: GIVENS CAPSULE STUDY;  Surgeon: [name], MD;  Location: [facility] ENDOSCOPY;  Service: Endoscopy;  Laterality: N/A;  . Eus N/A 05/20/2015    Procedure: UPPER ENDOSCOPIC ULTRASOUND (EUS) LINEAR;  Surgeon: [name], MD;  Location: [facility] ENDOSCOPY;  Service: Endoscopy;  Laterality: N/A;   Family History  Problem Relation Age of Onset  . Stroke Mother   . Heart disease Mother   . Emphysema Father   . Ovarian cancer Sister   . Stroke Sister   . Other Child     died at birth   Social History  Substance Use Topics  . Smoking status: Never Smoker   . Smokeless tobacco: Never Used  . Alcohol Use: No   OB History  No data available     Review of Systems  Cardiovascular: Positive for chest pain.  All other systems reviewed and are negative.     Allergies  Iodinated diagnostic agents; Milk-related compounds; Darvon; and Hydralazine  Home Medications   Prior to Admission medications   Medication Sig Start Date End Date Taking? Authorizing Provider  amLODipine (NORVASC) 10 MG tablet TAKE ONE TABLET BY MOUTH ONCE DAILY. 06/16/15   Golden Circle, [name]  cloNIDine (CATAPRES) 0.1 MG tablet Take 1 tablet (0.1 mg total) by mouth daily as needed (high blood pressure, will take if levels are 180/90). Patient taking differently: Take 0.1 mg by mouth daily as needed (high blood pressure, take  if blood pressure is >179/99.).  05/08/15   Geradine Girt, DO  furosemide (LASIX) 40 MG tablet TAKE 1 TO 2 TABLETS BY MOUTH ONCE DAILY. Patient taking differently: TAKE 1 TO 2 TABLETS BY MOUTH ONCE DAILY. Takes 2 tablets if foot swelling is bad. 04/09/15   Golden Circle, [name]  gabapentin (NEURONTIN) 100 MG capsule TAKE 1 CAPSULE BY MOUTH THREE TIMES A DAY. 09/03/15   Golden Circle, [name]  meclizine (ANTIVERT) 25 MG tablet Take 1 tablet (25 mg total) by mouth 3 (three) times daily as needed for dizziness. 08/28/14   Bonnielee Haff, MD  metoprolol (LOPRESSOR) 50 MG tablet TAKE 1 TABLET BY MOUTH TWICE DAILY. 06/16/15   Golden Circle, [name]  NONFORMULARY OR COMPOUNDED ITEM Diclofenac/Baclofen/Cyclobenzaprine/Gabapentin/Lidocaine 3/2/2/6/2.5% Cream  Apply to affected area 2-3 times per day as needed for arthritic pain. 04/16/15   Golden Circle, [name]  ondansetron (ZOFRAN) 4 MG tablet Take 4 mg by mouth every 8 (eight) hours as needed for nausea or vomiting.    Historical Provider, MD  oxycodone (OXY-IR) 5 MG capsule Take 1 capsule (5 mg total) by mouth 2 (two) times daily as needed for pain. Patient taking differently: Take 5 mg by mouth 2 (two) times daily as needed (severe pain).  04/16/15   Golden Circle, [name]  pantoprazole (PROTONIX) 40 MG tablet Take 1 tablet (40 mg total) by mouth 2 (two) times daily. 08/20/15   Golden Circle, [name]  simvastatin (ZOCOR) 20 MG tablet Take 20 mg by mouth at bedtime.    Historical Provider, MD  temazepam (RESTORIL) 15 MG capsule TAKE 1 CAPSULE BY MOUTH AT BEDTIME. 08/18/15   Golden Circle, [name]  tiZANidine (ZANAFLEX) 4 MG tablet Take 4 mg by mouth every 6 (six) hours as needed for muscle spasms.    Historical Provider, MD  traMADol (ULTRAM) 50 MG tablet Take 1 tablet (50 mg total) by mouth every 8 (eight) hours as needed for moderate pain. 04/16/15   Golden Circle, [name]  valsartan-hydrochlorothiazide (DIOVAN-HCT) 320-25 MG tablet Take 0.5 tablets by mouth daily.     Historical Provider, MD  vitamin B-12 1000 MCG tablet Take 1 tablet (1,000 mcg total) by mouth daily. 05/08/15   Geradine Girt, DO  warfarin (COUMADIN) 5 MG tablet Take 5 mg by mouth daily at 6 PM.    Historical Provider, MD   BP 174/69 mmHg  Pulse 91  Temp(Src) 98.6 F (37 C) (Oral)  Resp 18  Ht 5\' 2"  (1.575 m)  Wt 130 lb (58.968 kg)  BMI 23.77 kg/m2  SpO2 98% Physical Exam  Constitutional: She is oriented to person, place, and time. She appears well-developed and well-nourished.  HENT:  Head: Normocephalic and atraumatic.  Cardiovascular:  Irregular rhythm, SEM  Pulmonary/Chest: Effort normal  and breath sounds normal. No respiratory distress.  Abdominal: Soft. There is no tenderness. There is no rebound and no guarding.  Musculoskeletal: She exhibits no edema or tenderness.  Neurological: She is alert and oriented to person, place, and time.  Skin: Skin is warm and dry.  Psychiatric:  Anxious appearing.   Nursing note and vitals reviewed.   ED Course  Procedures (including critical care time) Labs Review Labs Reviewed  BASIC METABOLIC PANEL  [place], ED    Imaging Review No results found. I have personally reviewed and evaluated these images and lab results as part of my medical decision-making.   EKG Interpretation   Date/Time:  Sunday September 05 2015 12:13:33 EST Ventricular Rate:  92 PR Interval:    QRS Duration: 77 QT Interval:  380 QTC Calculation: 470 R Axis:   51 Text Interpretation:  Atrial fibrillation Minimal ST depression, diffuse  leads Confirmed by Hazle Coca [phone]) on 09/05/2015 12:18:35 PM      MDM   Final diagnoses:  None    Pt here for evaluation of CP, SOB, nausea.  She is in rate controlled atrial fibrillation in the ED.  Her sxs are waxing and waning in nature with no significant change with NTG in the department.  During ED stay she developed nausea, vomiting and epigastric tenderness.  Plan to admit to  medicine for observation of chest pain.      Quintella Reichert, MD 09/06/15 [phone]

## 2015-09-06 DIAGNOSIS — I10 Essential (primary) hypertension: Secondary | ICD-10-CM | POA: Diagnosis not present

## 2015-09-06 DIAGNOSIS — I5032 Chronic diastolic (congestive) heart failure: Secondary | ICD-10-CM | POA: Diagnosis not present

## 2015-09-06 DIAGNOSIS — F419 Anxiety disorder, unspecified: Secondary | ICD-10-CM | POA: Diagnosis not present

## 2015-09-06 DIAGNOSIS — I499 Cardiac arrhythmia, unspecified: Secondary | ICD-10-CM | POA: Diagnosis not present

## 2015-09-06 DIAGNOSIS — R079 Chest pain, unspecified: Secondary | ICD-10-CM | POA: Diagnosis not present

## 2015-09-06 DIAGNOSIS — I509 Heart failure, unspecified: Secondary | ICD-10-CM | POA: Diagnosis not present

## 2015-09-06 DIAGNOSIS — I480 Paroxysmal atrial fibrillation: Secondary | ICD-10-CM | POA: Diagnosis not present

## 2015-09-06 LAB — BASIC METABOLIC PANEL
ANION GAP: 9 (ref 5–15)
BUN: 12 mg/dL (ref 6–20)
CALCIUM: 8.7 mg/dL — AB (ref 8.9–10.3)
CHLORIDE: 107 mmol/L (ref 101–111)
CO2: 26 mmol/L (ref 22–32)
Creatinine, Ser: 0.95 mg/dL (ref 0.44–1.00)
GFR calc non Af Amer: 57 mL/min — ABNORMAL LOW (ref >60)
Glucose, Bld: 123 mg/dL — ABNORMAL HIGH (ref 65–99)
Potassium: 3.9 mmol/L (ref 3.5–5.1)
Sodium: 142 mmol/L (ref 135–145)

## 2015-09-06 LAB — TSH: TSH: 3.241 u[IU]/mL (ref 0.350–4.500)

## 2015-09-06 LAB — CBC
HCT: 35.5 % — ABNORMAL LOW (ref 36.0–46.0)
HEMOGLOBIN: 11.5 g/dL — AB (ref 12.0–15.0)
MCH: 26.9 pg (ref 26.0–34.0)
MCHC: 32.4 g/dL (ref 30.0–36.0)
MCV: 83.1 fL (ref 78.0–100.0)
Platelets: 151 10*3/uL (ref 150–400)
RBC: 4.27 MIL/uL (ref 3.87–5.11)
RDW: 13.3 % (ref 11.5–15.5)
WBC: 6.1 10*3/uL (ref 4.0–10.5)

## 2015-09-06 LAB — PROTIME-INR
INR: 2.28 — AB (ref 0.00–1.49)
Prothrombin Time: 24.9 seconds — ABNORMAL HIGH (ref 11.6–15.2)

## 2015-09-06 LAB — TROPONIN I: Troponin I: 0.03 ng/mL (ref <0.031)

## 2015-09-06 NOTE — Progress Notes (Signed)
[facility] PROGRESS NOTE    Progress Note   JENIFER CROSSETT [id number] DOB: 1940-07-24 DOA: 09/05/2015 PCP: Mauricio Po, [name]   Brief Narrative:   [name] is an 75 y.o. female comes in with CP.  Assessment/Plan:   Chest pain at rest: Heart score4, patient converted from Cont asa. Cardiac biomarkers negative 2. Temporarily go home after third set negative. General recent stress test in 2017 and [facility], disheveled exercise-induced atrial fibrillation with transient RVR, no significant EKG changes, with no exercise-induced chest pain or dyspnea.  PAF (paroxysmal atrial fibrillation) ([place]): Converted to sinus rhythm. [place] score 3. INR 2.86 Continue metoprolol.  Chronic diastolic (congestive) heart failure ([name]) Continue Lasix, metoprolol and ACE inhibitor. Continue daily weights and strict is and os she seems to be euvolemic.  Essential  Hypertension  GE junction carcinoma ([place]) W in situ approval form of January 2017   DVT Prophylaxis - Lovenox ordered.  Family Communication: none Disposition Plan: Home this afternoon Code Status:     Code Status Orders        Start     Ordered   09/05/15 1539  Full code   Continuous     09/05/15 1540    Code Status History    Date Active Date Inactive Code Status Order ID Comments User Context   05/05/2015  2:01 AM 05/08/2015  5:40 PM Full Code [id number]  Ivor Costa, MD ED   02/13/2015 12:44 AM 02/17/2015  7:01 PM Full Code [id number]  Reubin Milan, MD ED   08/24/2014  8:22 PM 08/28/2014  7:43 PM Full Code [id number]  Berle Mull, MD ED        IV Access:    Peripheral IV   Procedures and diagnostic studies:   Dg Chest 2 View  09/05/2015  CLINICAL DATA:  Chest pain EXAM: CHEST  2 VIEW COMPARISON:  02/12/2015 FINDINGS: Normal heart size. There is aortic atherosclerosis. Large hiatal hernia is identified. No pleural effusion or edema identified. Bronchial wall thickening is noted bilaterally. IMPRESSION:  1. No acute cardiopulmonary abnormalities. 2. Hiatal hernia 3. Aortic atherosclerosis Electronically Signed   By: Kerby Moors M.D.   On: 09/05/2015 13:21     Medical Consultants:    None.  Anti-Infectives:   Anti-infectives    None      Subjective:    [name] she is chest pain and shortness of breath free.  Objective:    Filed Vitals:   09/05/15 2000 09/06/15 0008 09/06/15 0527 09/06/15 0817  BP: 127/51 114/50 100/42 116/55  Pulse:  75 54   Temp: 98 F (36.7 C) 97.9 F (36.6 C) 97.8 F (36.6 C)   TempSrc: Oral     Resp: 16 15 16    Height:      Weight:   56.337 kg (124 lb 3.2 oz)   SpO2: 100% 98% 100%     Intake/Output Summary (Last 24 hours) at 09/06/15 0955 Last data filed at 09/06/15 0528  Gross per 24 hour  Intake    440 ml  Output    200 ml  Net    240 ml   Filed Weights   09/05/15 1218 09/05/15 1740 09/06/15 0527  Weight: 58.968 kg (130 lb) 58.6 kg (129 lb 3 oz) 56.337 kg (124 lb 3.2 oz)    Exam: Gen:  NAD Cardiovascular:  RRR. Chest and lungs:   CTAB Abdomen:  Abdomen soft, NT/ND, + BS Extremities:  No edema   Data Reviewed:  Labs: Basic Metabolic Panel:  Recent Labs Lab 09/05/15 1334 09/06/15 0408  NA 141 142  K 3.9 3.9  CL 105 107  CO2 24 26  GLUCOSE 100* 123*  BUN 7 12  CREATININE 0.69 0.95  CALCIUM 9.0 8.7*   GFR Estimated Creatinine Clearance: 39.8 mL/min (by C-G formula based on Cr of 0.95). Liver Function Tests:  Recent Labs Lab 09/05/15 1334  AST 22  ALT 17  ALKPHOS 84  BILITOT 0.2*  PROT 6.6  ALBUMIN 3.9    Recent Labs Lab 09/05/15 1334  LIPASE 21   No results for input(s): AMMONIA in the last 168 hours. Coagulation profile  Recent Labs Lab 09/03/15 09/05/15 1334 09/06/15 0408  INR 3.7 2.86* 2.28*    CBC:  Recent Labs Lab 09/05/15 1334 09/06/15 0408  WBC 5.6 6.1  HGB 12.9 11.5*  HCT 39.1 35.5*  MCV 81.8 83.1  PLT 158 151   Cardiac Enzymes:  Recent Labs Lab  09/05/15 2008 09/06/15 0408  TROPONINI 0.03 0.03   BNP (last 3 results) No results for input(s): PROBNP in the last 8760 hours. CBG: No results for input(s): GLUCAP in the last 168 hours. D-Dimer: No results for input(s): DDIMER in the last 72 hours. Hgb A1c: No results for input(s): HGBA1C in the last 72 hours. Lipid Profile:  Recent Labs  09/05/15 1334  CHOL 173  HDL 47  LDLCALC 93  TRIG 167*  CHOLHDL 3.7   Thyroid function studies:  Recent Labs  09/06/15 0408  TSH 3.241   Anemia work up: No results for input(s): VITAMINB12, FOLATE, FERRITIN, TIBC, IRON, RETICCTPCT in the last 72 hours. Sepsis Labs:  Recent Labs Lab 09/05/15 1334 09/06/15 0408  WBC 5.6 6.1   Microbiology No results found for this or any previous visit (from the past 240 hour(s)).   Medications:   . amLODipine  10 mg Oral Daily  . aspirin  325 mg Oral Daily  . gabapentin  300 mg Oral TID  . hydrochlorothiazide  12.5 mg Oral Daily  . irbesartan  150 mg Oral Daily  . metoprolol  50 mg Oral BID  . pantoprazole  40 mg Oral BID  . simvastatin  20 mg Oral QHS  . temazepam  15 mg Oral QHS  . vitamin B-12  1,000 mcg Oral Daily  . [START ON 09/08/2015] warfarin  2.5 mg Oral Q Wed-1800  . warfarin  5 mg Oral Once per day on Sun Mon Tue Thu Fri Sat  . Warfarin - Pharmacist Dosing Inpatient   Does not apply q1800   Continuous Infusions:   Time spent: 25 min     Charlynne Cousins  [facility] Pager [phone]  *Please refer to [email or web address], password [email or web address] to get updated schedule on who will round on this patient, as hospitalists switch teams weekly. If 7PM-7AM, please contact night-coverage at [email or web address], password [email or web address] for any overnight needs.  09/06/2015, 9:55 AM

## 2015-09-06 NOTE — Discharge Summary (Signed)
Physician Discharge Summary  [name] [id number] DOB: 1941/05/31 DOA: 09/05/2015  PCP: Mauricio Po, [name]  Admit date: 09/05/2015 Discharge date: 09/06/2015  Time spent: 35 minutes  Recommendations for Outpatient Follow-up:  1. Follow up with Cardiology in 2 week   Discharge Diagnoses:  Principal Problem:   Chest pain at rest Active Problems:   PAF (paroxysmal atrial fibrillation) ([name])   Chronic diastolic (congestive) heart failure ([name])   Chest pain   Hypertension   GE junction carcinoma [facility])   Discharge Condition: stable  Diet recommendation: heart healthy  Filed Weights   09/05/15 1218 09/05/15 1740 09/06/15 0527  Weight: 58.968 kg (130 lb) 58.6 kg (129 lb 3 oz) 56.337 kg (124 lb 3.2 oz)    History of present illness:  75 yo F with history of A fib on Coumadin, HTN, GE junction Ca s/p resection at [facility], presented with chest pain and palpitations. She was found to be in A fib with controlled rates ~100-110. Initial troponin negative, EKG non ischemic. Will admit patient for chest pain rule out, monitor on telemetry overnight   Hospital Course:  Chest pain at rest: [place], patient converted from atrial fibrillation to sinus rhythm. Cont asa. Cardiac biomarkers negative 3 Recent stress test in 2017 and [facility], disheveled exercise-induced atrial fibrillation with transient RVR, no significant EKG changes, with no exercise-induced chest pain or dyspnea.  PAF (paroxysmal atrial fibrillation) ([place]): Converted to sinus rhythm. [place] score 3. INR 2.86 Continue metoprolol.  Chronic diastolic (congestive) heart failure ([name]) Continue Lasix, metoprolol and ACE inhibitor. Continue daily weights and strict is and os she seems to be euvolemic.  Essential Hypertension  GE junction carcinoma ([place]) W in situ approval form of January 2017  Procedures:  CXR  Consultations:  none  Discharge Exam: Filed Vitals:   09/06/15 0527 09/06/15 0817  BP:  100/42 116/55  Pulse: 54   Temp: 97.8 F (36.6 C)   Resp: 16     General: see progress note  Discharge Instructions   Discharge Instructions    Diet - low sodium heart healthy    Complete by:  As directed      Increase activity slowly    Complete by:  As directed           Current Discharge Medication List    CONTINUE these medications which have NOT CHANGED   Details  acetaminophen (TYLENOL) 500 MG tablet Take 1,000 mg by mouth at bedtime as needed for moderate pain.    amLODipine (NORVASC) 10 MG tablet TAKE ONE TABLET BY MOUTH ONCE DAILY. Qty: 90 tablet, Refills: 1    cloNIDine (CATAPRES) 0.1 MG tablet Take 1 tablet (0.1 mg total) by mouth daily as needed (high blood pressure, will take if levels are 180/90). Qty: 30 tablet, Refills: 0    furosemide (LASIX) 40 MG tablet TAKE 1 TO 2 TABLETS BY MOUTH ONCE DAILY. Qty: 30 tablet, Refills: 0    gabapentin (NEURONTIN) 100 MG capsule TAKE 1 CAPSULE BY MOUTH THREE TIMES A DAY. Qty: 90 capsule, Refills: 2    meclizine (ANTIVERT) 25 MG tablet Take 1 tablet (25 mg total) by mouth 3 (three) times daily as needed for dizziness. Qty: 30 tablet, Refills: 0    metoprolol (LOPRESSOR) 50 MG tablet TAKE 1 TABLET BY MOUTH TWICE DAILY. Qty: 180 tablet, Refills: 1    NONFORMULARY OR COMPOUNDED ITEM Diclofenac/Baclofen/Cyclobenzaprine/Gabapentin/Lidocaine 3/2/2/6/2.5% Cream  Apply to affected area 2-3 times per day as needed for arthritic pain. Qty: 1  each, Refills: 1   Associated Diagnoses: Primary osteoarthritis involving multiple joints    ondansetron (ZOFRAN) 4 MG tablet Take 4 mg by mouth every 8 (eight) hours as needed for nausea or vomiting.    pantoprazole (PROTONIX) 40 MG tablet Take 1 tablet (40 mg total) by mouth 2 (two) times daily. Qty: 60 tablet, Refills: 5    simvastatin (ZOCOR) 20 MG tablet Take 20 mg by mouth at bedtime.    temazepam (RESTORIL) 15 MG capsule TAKE 1 CAPSULE BY MOUTH AT BEDTIME. Qty: 30 capsule,  Refills: 0    traMADol (ULTRAM) 50 MG tablet Take 1 tablet (50 mg total) by mouth every 8 (eight) hours as needed for moderate pain. Qty: 90 tablet, Refills: 0   Associated Diagnoses: Primary osteoarthritis involving multiple joints    valsartan-hydrochlorothiazide (DIOVAN-HCT) 320-25 MG tablet Take 0.5 tablets by mouth daily.    vitamin B-12 1000 MCG tablet Take 1 tablet (1,000 mcg total) by mouth daily. Qty: 30 tablet, Refills: 0    warfarin (COUMADIN) 5 MG tablet Take 5 mg by mouth daily at 6 PM. 2.5 mg on Wednesday, 5 mg all other days    oxycodone (OXY-IR) 5 MG capsule Take 1 capsule (5 mg total) by mouth 2 (two) times daily as needed for pain. Qty: 60 capsule, Refills: 0   Associated Diagnoses: Primary osteoarthritis involving multiple joints       Allergies  Allergen Reactions  . Iodinated Diagnostic Agents Anaphylaxis    IPD dye Info given by patient  . Ioxaglate Anaphylaxis    Info given by patient  . Milk-Related Compounds Anaphylaxis    Lactose intolerance  . Whey     Lactose intolerance  . Darvon [Propoxyphene] Rash  . Hydralazine Anxiety    Facial flushing, pt prefers not to use it.    Follow-up Information    Follow up with Pixie Casino, MD In 2 weeks.   Specialty:  Cardiology   Contact information:   [address] [place] [address] [phone]        The results of significant diagnostics from this hospitalization (including imaging, microbiology, ancillary and laboratory) are listed below for reference.    Significant Diagnostic Studies: Dg Chest 2 View  09/05/2015  CLINICAL DATA:  Chest pain EXAM: CHEST  2 VIEW COMPARISON:  02/12/2015 FINDINGS: Normal heart size. There is aortic atherosclerosis. Large hiatal hernia is identified. No pleural effusion or edema identified. Bronchial wall thickening is noted bilaterally. IMPRESSION: 1. No acute cardiopulmonary abnormalities. 2. Hiatal hernia 3. Aortic atherosclerosis Electronically  Signed   By: Kerby Moors M.D.   On: 09/05/2015 13:21    Microbiology: No results found for this or any previous visit (from the past 240 hour(s)).   Labs: Basic Metabolic Panel:  Recent Labs Lab 09/05/15 1334 09/06/15 0408  NA 141 142  K 3.9 3.9  CL 105 107  CO2 24 26  GLUCOSE 100* 123*  BUN 7 12  CREATININE 0.69 0.95  CALCIUM 9.0 8.7*   Liver Function Tests:  Recent Labs Lab 09/05/15 1334  AST 22  ALT 17  ALKPHOS 84  BILITOT 0.2*  PROT 6.6  ALBUMIN 3.9    Recent Labs Lab 09/05/15 1334  LIPASE 21   No results for input(s): AMMONIA in the last 168 hours. CBC:  Recent Labs Lab 09/05/15 1334 09/06/15 0408  WBC 5.6 6.1  HGB 12.9 11.5*  HCT 39.1 35.5*  MCV 81.8 83.1  PLT 158 151   Cardiac  Enzymes:  Recent Labs Lab 09/05/15 2008 09/06/15 0408  TROPONINI 0.03 0.03   BNP: BNP (last 3 results)  Recent Labs  02/12/15 2130 05/05/15 0303  BNP 394.8* 103.7*    ProBNP (last 3 results) No results for input(s): PROBNP in the last 8760 hours.  CBG: No results for input(s): GLUCAP in the last 168 hours.  Signed:  Charlynne Cousins MD.  [facility] 09/06/2015, 10:09 AM

## 2015-09-06 NOTE — Progress Notes (Signed)
The patient converted to sinus rhythm around 5am. Rate controlled. Troponin negative. On coumadin for anticoagulation. F/u with Dr. Debara Pickett as schedule. Call if any questions.   Yvan Dority, [place]

## 2015-09-06 NOTE — Care Management Obs Status (Signed)
[place] NOTIFICATION   Patient Details  Name: [name] MRN: [id number] Date of Birth: 24-Apr-1941   Medicare Observation Status Notification Given:  Yes (cp)    Bethena Roys, RN 09/06/2015, 12:20 PM

## 2015-09-06 NOTE — Discharge Instructions (Signed)
Information on my medicine - Coumadin   (Warfarin)  This medication education was reviewed with me or my healthcare representative as part of my discharge preparation.  The pharmacist that spoke with me during my hospital stay was:  Lavenia Atlas, [facility]  Why was Coumadin prescribed for you? Coumadin was prescribed for you because you have a blood clot or a medical condition that can cause an increased risk of forming blood clots. Blood clots can cause serious health problems by blocking the flow of blood to the heart, lung, or brain. Coumadin can prevent harmful blood clots from forming. As a reminder your indication for Coumadin is:   Stroke Prevention Because Of Atrial Fibrillation  What test will check on my response to Coumadin? While on Coumadin (warfarin) you will need to have an INR test regularly to ensure that your dose is keeping you in the desired range. The INR (international normalized ratio) number is calculated from the result of the laboratory test called prothrombin time (PT).  If an INR APPOINTMENT HAS NOT ALREADY BEEN MADE FOR YOU please schedule an appointment to have this lab work done by your health care provider within 7 days. Your INR goal is usually a number between:  2 to 3 or your provider may give you a more narrow range like 2-2.5.  Ask your health care provider during an office visit what your goal INR is.  What  do you need to  know  About  COUMADIN? Take Coumadin (warfarin) exactly as prescribed by your healthcare provider about the same time each day.  DO NOT stop taking without talking to the doctor who prescribed the medication.  Stopping without other blood clot prevention medication to take the place of Coumadin may increase your risk of developing a new clot or stroke.  Get refills before you run out.  What do you do if you miss a dose? If you miss a dose, take it as soon as you remember on the same day then continue your regularly scheduled regimen the  next day.  Do not take two doses of Coumadin at the same time.  Important Safety Information A possible side effect of Coumadin (Warfarin) is an increased risk of bleeding. You should call your healthcare provider right away if you experience any of the following: ? Bleeding from an injury or your nose that does not stop. ? Unusual colored urine (red or dark brown) or unusual colored stools (red or black). ? Unusual bruising for unknown reasons. ? A serious fall or if you hit your head (even if there is no bleeding).  Some foods or medicines interact with Coumadin (warfarin) and might alter your response to warfarin. To help avoid this: ? Eat a balanced diet, maintaining a consistent amount of Vitamin K. ? Notify your provider about major diet changes you plan to make. ? Avoid alcohol or limit your intake to 1 drink for women and 2 drinks for men per day. (1 drink is 5 oz. wine, 12 oz. beer, or 1.5 oz. liquor.)  Make sure that ANY health care provider who prescribes medication for you knows that you are taking Coumadin (warfarin).  Also make sure the healthcare provider who is monitoring your Coumadin knows when you have started a new medication including herbals and non-prescription products.  Coumadin (Warfarin)  Major Drug Interactions  Increased Warfarin Effect Decreased Warfarin Effect  Alcohol (large quantities) Antibiotics (esp. Septra/Bactrim, Flagyl, Cipro) Amiodarone (Cordarone) Aspirin (ASA) Cimetidine (Tagamet) Megestrol (Megace) NSAIDs (ibuprofen,  naproxen, etc.) °Piroxicam (Feldene) °Propafenone (Rythmol SR) °Propranolol (Inderal) °Isoniazid (INH) °Posaconazole (Noxafil) Barbiturates (Phenobarbital) °Carbamazepine (Tegretol) °Chlordiazepoxide (Librium) °Cholestyramine (Questran) °Griseofulvin °Oral Contraceptives °Rifampin °Sucralfate (Carafate) °Vitamin K  ° °Coumadin® (Warfarin) Major Herbal Interactions  °Increased Warfarin Effect Decreased Warfarin Effect   °Garlic °Ginseng °Ginkgo biloba Coenzyme Q10 °Green tea °St. John’s wort   ° °Coumadin® (Warfarin) FOOD Interactions  °Eat a consistent number of servings per week of foods HIGH in Vitamin K °(1 serving = ½ cup)  °Collards (cooked, or boiled & drained) °Kale (cooked, or boiled & drained) °Mustard greens (cooked, or boiled & drained) °Parsley *serving size only = ¼ cup °Spinach (cooked, or boiled & drained) °Swiss chard (cooked, or boiled & drained) °Turnip greens (cooked, or boiled & drained)  °Eat a consistent number of servings per week of foods MEDIUM-HIGH in Vitamin K °(1 serving = 1 cup)  °Asparagus (cooked, or boiled & drained) °Broccoli (cooked, boiled & drained, or raw & chopped) °Brussel sprouts (cooked, or boiled & drained) *serving size only = ½ cup °Lettuce, raw (green leaf, endive, romaine) °Spinach, raw °Turnip greens, raw & chopped  ° °These websites have more information on Coumadin (warfarin):  www.coumadin.com; °www.ahrq.gov/consumer/coumadin.htm; ° ° °

## 2015-09-07 ENCOUNTER — Telehealth: Payer: Self-pay | Admitting: *Deleted

## 2015-09-07 NOTE — Telephone Encounter (Signed)
Pt was on TCM list admitted for CP & CHF. Pt d/c 09/06/15, and will be f/u w/cardiology Dr. Lyman Bishop in 2 wks...[name]

## 2015-09-16 ENCOUNTER — Other Ambulatory Visit: Payer: Self-pay | Admitting: Family

## 2015-09-16 NOTE — Telephone Encounter (Signed)
Script fax to [employer]...[name]

## 2015-09-24 ENCOUNTER — Ambulatory Visit (INDEPENDENT_AMBULATORY_CARE_PROVIDER_SITE_OTHER): Payer: Medicare Other | Admitting: General Practice

## 2015-09-24 DIAGNOSIS — Z5181 Encounter for therapeutic drug level monitoring: Secondary | ICD-10-CM | POA: Diagnosis not present

## 2015-09-24 DIAGNOSIS — Z86711 Personal history of pulmonary embolism: Secondary | ICD-10-CM

## 2015-09-24 DIAGNOSIS — I480 Paroxysmal atrial fibrillation: Secondary | ICD-10-CM | POA: Diagnosis not present

## 2015-09-24 LAB — POCT INR: INR: 2.4

## 2015-09-24 NOTE — Progress Notes (Signed)
Pre visit review using our clinic review tool, if applicable. No additional management support is needed unless otherwise documented below in the visit note. 

## 2015-09-25 NOTE — Progress Notes (Signed)
I have reviewed and agree with the plan. 

## 2015-09-27 ENCOUNTER — Other Ambulatory Visit: Payer: Self-pay | Admitting: Family

## 2015-09-28 ENCOUNTER — Encounter: Payer: Self-pay | Admitting: Internal Medicine

## 2015-09-28 ENCOUNTER — Ambulatory Visit (INDEPENDENT_AMBULATORY_CARE_PROVIDER_SITE_OTHER): Payer: Medicare Other | Admitting: Internal Medicine

## 2015-09-28 VITALS — BP 122/62 | HR 52 | Ht 62.0 in | Wt 130.1 lb

## 2015-09-28 DIAGNOSIS — Z7901 Long term (current) use of anticoagulants: Secondary | ICD-10-CM

## 2015-09-28 DIAGNOSIS — R252 Cramp and spasm: Secondary | ICD-10-CM | POA: Diagnosis not present

## 2015-09-28 DIAGNOSIS — R079 Chest pain, unspecified: Secondary | ICD-10-CM

## 2015-09-28 DIAGNOSIS — Z79899 Other long term (current) drug therapy: Secondary | ICD-10-CM | POA: Diagnosis not present

## 2015-09-28 DIAGNOSIS — I350 Nonrheumatic aortic (valve) stenosis: Secondary | ICD-10-CM

## 2015-09-28 DIAGNOSIS — M79642 Pain in left hand: Secondary | ICD-10-CM

## 2015-09-28 DIAGNOSIS — I480 Paroxysmal atrial fibrillation: Secondary | ICD-10-CM

## 2015-09-28 LAB — BASIC METABOLIC PANEL
BUN: 16 mg/dL (ref 7–25)
CALCIUM: 9 mg/dL (ref 8.6–10.4)
CHLORIDE: 99 mmol/L (ref 98–110)
CO2: 32 mmol/L — ABNORMAL HIGH (ref 20–31)
CREATININE: 0.96 mg/dL — AB (ref 0.60–0.93)
Glucose, Bld: 104 mg/dL — ABNORMAL HIGH (ref 65–99)
Potassium: 4.2 mmol/L (ref 3.5–5.3)
SODIUM: 141 mmol/L (ref 135–146)

## 2015-09-28 LAB — MAGNESIUM: Magnesium: 2 mg/dL (ref 1.5–2.5)

## 2015-09-28 NOTE — Patient Instructions (Addendum)
You have been referred to Dr. Amedeo Plenty [facility]  Your physician recommends that you return for lab work TODAY - BMET, magnesium   Your physician wants you to follow-up in: 6 months with Dr. Debara Pickett. You will receive a reminder letter in the mail two months in advance. If you don't receive a letter, please call our office to schedule the follow-up appointment.

## 2015-09-28 NOTE — Progress Notes (Signed)
OFFICE NOTE  Chief Complaint:  Hospital follow-up  Primary Care Physician: Mauricio Po, [name]  HPI:  [name] is a pleasant 75 year old female kindly referred to me be Dr. Ardeth Perfect. She reports a past medical history significant for atrial fibrillation and congestive heart failure, which occurred around the same time and probably 2012. She vaguely recalls seeing Dr. Irish Lack at that time.  She was started on warfarin therapy and no attempts were made to get her back into rhythm as she was paroxysmal. She does has a long-standing history of hypertension and dyslipidemia. She apparently developed heart failure or however has never had reassessment of her ejection fraction. She subsequently was having health issues and decided to move with her son to live in [place]. She says at that time she had some problems with chest pain and ultimately underwent a stress test. This was associated with an anaphylactic type reaction and despite their reassurances she will "no longer have any more stress test". She says she has a severe allergy to IV contrast dye which causes throat swelling as well as milk which also causes the same symptoms. Apparently she also underwent cardiac catheterization however she says that only medical therapy was recommended. This was in [place], [place].  We are trying to request records from there as well. She subsequently moved with her son to [facility], however he lost his job and she recently moved back to [place]. Currently she denies any significant shortness of breath but does report some mild leg edema which is slightly worse and occasional pain in her left chest and left arm over the last several days. She's also had pain in the right chest. None of those symptoms are worse with exertion or relieved by rest or medication.   Mrs. Garver returns today for followup. She again appears upset and quite nervous. Blood pressure recently has been significantly elevated. In  fact she went to the emergency room and was hospitalized for blood pressure control. Although it seems that her blood pressure more easily was controlled once her oral medicines were restarted. Subsequently she's been started on clonidine is now up to twice a day. Blood pressure today was still high at [id number] systolic. She reports some occasional chest discomfort, mostly at rest and reports not being active enough to know if she has symptoms with exertion. She also has pain all over body significant low back pain and is seeing a specialist about that in the next few weeks.  I saw Ms. Atchley back in the office today. She reports some improvement in her chest discomfort and I think this is related to blood pressure. The recent switch in her blood pressure medicines indicate a marked improvement in her pressures in she is noted to be 130/66 today. She does get some slight shortness of breath with exertion and no regular chest discomfort. At this time there is not clear evidence to pursue a further workup although I have a low threshold for further cardiac workup if she should become more symptomatic. She is in sinus rhythm and seems to be maintaining that.  Mrs. Ogea returns today for follow-up. She's had a remarkable past 6 months. Unfortunately she was diagnosed with a gastric cancer which was considered very high risk to resect. She was referred to [facility] for an endoscopic resection which was successful with clean margins. Prior to that she underwent cardiac evaluation by [facility] cardiology including a nuclear stress test which was low risk and didn't involve  exercise. She reports that during the last minute of exercise she went into A. fib. She's had recurrent A. fib on and off and in fact was just hospitalized again for A. fib a few weeks ago which converted spontaneously back to sinus rhythm, prior to cardiology is evaluating her. She was then directed to follow-up with me. She has fortunately been on  warfarin with therapeutic INRs. Of note, I did review her cardiac catheterization which was performed at an outside hospital 2014 which did demonstrate an 80% mid RCA stenosis as well as a 70% OM lesion and some mild to moderate disease of other vessels. At the time no stent was recommended, which leads me to believe these may be smaller vessels. The fact that her most recent stress test was again negative suggest that they are not flow limiting even though they would seem significant. Also, today she is describing left hand pain. She says that there is exquisite tenderness the left hand and it's difficult for her to extend her finger. She often notes that her left middle finger gets stuck. On palpation I do not notice any significant arthritis of the joints or nodules on the tendons.  PMHx:  Past Medical History  Diagnosis Date  . CHF (congestive heart failure) ([place])   . Hypertension   . Broken back 2013    chronic back pain.   . Atrial fibrillation ([place])   . GERD (gastroesophageal reflux disease)   . Arthritis   . Stroke ([place])   . Pulmonary emboli ([place]) 2008, 2012  . Anemia 2005    generally microcytic, transfusions in [address], 2012, 02/2015, 05/2015  . HH (hiatus hernia) 2008    large with associated erosions.   . Allergy   . Esophageal cancer ([place]) 05/06/15    adenocarcinoma ge junction  . PONV (postoperative nausea and vomiting)   . Transfusion history     2 units transfused 05-06-15([facility])  . H/O iron deficiency     05-06-15 iron infusion ([facility])    Past Surgical History  Procedure Laterality Date  . Lumbar disc surgery  1991  . Lumbar back surgery  2012  . Cervical disc surgery  2014  . Cholecystectomy  1980s    open  . Appendectomy  1950s  . Tonsillectomy and adenoidectomy  1960s  . Carpal tunnel release Bilateral 1990s  . Esophagogastroduodenoscopy N/A 02/16/2015    Procedure: ESOPHAGOGASTRODUODENOSCOPY (EGD);  Surgeon: [name], MD;  Location: [facility] END[facility]COPY;  Service:  Endoscopy;  Laterality: N/A;  . Colonoscopy N/A 02/17/2015    Procedure: COLONOSCOPY;  Surgeon: [name], MD;  Location: [facility] ENDOSCOPY;  Service: Endoscopy;  Laterality: N/A;  . Exploratory lab  1950s or 1960s  . Esophagogastroduodenoscopy N/A 05/06/2015    Procedure: ESOPHAGOGASTRODUODENOSCOPY (EGD);  Surgeon: [name], MD;  Location: [facility] ENDOSCOPY;  Service: Endoscopy;  Laterality: N/A;  . Givens capsule study N/A 05/06/2015    Procedure: GIVENS CAPSULE STUDY;  Surgeon: [name], MD;  Location: [facility] ENDOSCOPY;  Service: Endoscopy;  Laterality: N/A;  . Eus N/A 05/20/2015    Procedure: UPPER ENDOSCOPIC ULTRASOUND (EUS) LINEAR;  Surgeon: [name], MD;  Location: [facility] ENDOSCOPY;  Service: Endoscopy;  Laterality: N/A;    FAMHx:  Family History  Problem Relation Age of Onset  . Stroke Mother   . Heart disease Mother   . Emphysema Father   . Ovarian cancer Sister   . Stroke Sister   . Other Child     died  at birth    SOCHx:   reports that she has never smoked. She has never used smokeless tobacco. She reports that she does not drink alcohol or use illicit drugs.  ALLERGIES:  Allergies  Allergen Reactions  . Iodinated Diagnostic Agents Anaphylaxis    IPD dye Info given by patient  . Ioxaglate Anaphylaxis    Info given by patient  . Milk-Related Compounds Anaphylaxis    Lactose intolerance  . Whey     Lactose intolerance  . Darvon [Propoxyphene] Rash  . Hydralazine Anxiety    Facial flushing, pt prefers not to use it.     ROS: Pertinent items noted in HPI and remainder of comprehensive ROS otherwise negative.  HOME MEDS: Current Outpatient Prescriptions  Medication Sig Dispense Refill  . acetaminophen (TYLENOL) 500 MG tablet Take 1,000 mg by mouth at bedtime as needed for moderate pain.    [name] amLODipine (NORVASC) 10 MG tablet TAKE ONE TABLET BY MOUTH ONCE DAILY. 90 tablet 1  . cloNIDine (CATAPRES) 0.1 MG tablet Take 1 tablet (0.1 mg total) by mouth daily as  needed (high blood pressure, will take if levels are 180/90). (Patient taking differently: Take 0.1 mg by mouth daily as needed (high blood pressure, take if blood pressure is >179/99.). ) 30 tablet 0  . furosemide (LASIX) 40 MG tablet TAKE 1 TO 2 TABLETS BY MOUTH ONCE DAILY. 30 tablet 0  . gabapentin (NEURONTIN) 100 MG capsule TAKE 1 CAPSULE BY MOUTH THREE TIMES A DAY. 90 capsule 2  . meclizine (ANTIVERT) 25 MG tablet Take 1 tablet (25 mg total) by mouth 3 (three) times daily as needed for dizziness. 30 tablet 0  . metoprolol (LOPRESSOR) 50 MG tablet TAKE 1 TABLET BY MOUTH TWICE DAILY. 180 tablet 1  . NONFORMULARY OR COMPOUNDED ITEM Diclofenac/Baclofen/Cyclobenzaprine/Gabapentin/Lidocaine 3/2/2/6/2.5% Cream  Apply to affected area 2-3 times per day as needed for arthritic pain. (Patient taking differently: Apply 1 application topically daily as needed (for arthritis). Diclofenac/Baclofen/Cyclobenzaprine/Gabapentin/Lidocaine 3/2/2/6/2.5% Cream  Apply to affected area 2-3 times per day as needed for arthritic pain.) 1 each 1  . ondansetron (ZOFRAN) 4 MG tablet Take 4 mg by mouth every 8 (eight) hours as needed for nausea or vomiting.    . pantoprazole (PROTONIX) 40 MG tablet Take 1 tablet (40 mg total) by mouth 2 (two) times daily. 60 tablet 5  . simvastatin (ZOCOR) 20 MG tablet Take 20 mg by mouth at bedtime.    . temazepam (RESTORIL) 15 MG capsule TAKE 1 CAPSULE BY MOUTH AT BEDTIME. 30 capsule 0  . traMADol (ULTRAM) 50 MG tablet Take 1 tablet (50 mg total) by mouth every 8 (eight) hours as needed for moderate pain. 90 tablet 0  . valsartan-hydrochlorothiazide (DIOVAN-HCT) 320-25 MG tablet Take 0.5 tablets by mouth daily.    . vitamin B-12 1000 MCG tablet Take 1 tablet (1,000 mcg total) by mouth daily. 30 tablet 0  . warfarin (COUMADIN) 5 MG tablet Take 5 mg by mouth daily at 6 PM. 2.5 mg on Wednesday, 5 mg all other days     No current facility-administered medications for this visit.     LABS/IMAGING: No results found for this or any previous visit (from the past 48 hour(s)). No results found.  VITALS: BP 122/62 mmHg  Pulse 52  Ht 5\' 2"  (1.575 m)  Wt 130 lb 2 oz (59.024 kg)  BMI 23.79 kg/m2  EXAM: General appearance: alert and no distress Neck: no carotid bruit, no JVD and thyroid not enlarged,  symmetric, no tenderness/mass/nodules Lungs: clear to auscultation bilaterally Heart: regular rate and rhythm, S1, S2 normal, no murmur, click, rub or gallop Abdomen: soft, non-tender; bowel sounds normal; no masses,  no organomegaly Extremities: varicose veins noted and Left hand pain with tenderness on palpation Pulses: 2+ and symmetric Skin: Skin color, texture, turgor normal. No rashes or lesions Neurologic: Grossly normal Psych: Pleasant  EKG: Sinus bradycardia 52  ASSESSMENT: 1. Paroxysmal atrial fibrillation on warfarin 2. History of diastolic congestive heart failure - EF 60-65% on echo 3. Hypertension - controlled 4. Dyslipidemia 5. Mild aortic stenosis 6. Right hand pain with contracture 7. Recent GI cancer/resected  PLAN: 1.   Mrs. Hodes had an eventful past 6 months. She was referred to [facility] for endoscopic resection of gastric cancer which was successful after preoperative evaluation by cardiologist there including a stress test which was low risk. Unfortunate she developed A. fib with exercise. She's had PAF in the past. She is very symptomatic with this and was seen again recently in [facility]. She again spontaneously converted. There is an increased risk that she may have additional atrial fibrillation, but is not clear if any this may be due to recent surgery. I would recommend continued monitoring and treatment with beta blocker for now. If she has again a recurrence, that we should consider antiarrhythmic therapy. Given the fact she has known coronary artery disease and valvular heart disease, the options may be somewhat limited. She will need a  repeat echocardiogram in 6-12 months for further surveillance of her mild aortic stenosis. I've recommended a referral to Dr. Amedeo Plenty for evaluation of her left hand pain.  Follow-up in 6 months.  Pixie Casino, MD, [facility] Attending Cardiologist [place] 09/28/2015, 12:49 PM

## 2015-09-29 ENCOUNTER — Telehealth: Payer: Self-pay | Admitting: Internal Medicine

## 2015-09-29 ENCOUNTER — Telehealth: Payer: Self-pay | Admitting: *Deleted

## 2015-09-29 NOTE — Telephone Encounter (Signed)
Follow up  ° ° °Pt returning call  °

## 2015-09-29 NOTE — Telephone Encounter (Signed)
Left message for patient to call back  

## 2015-09-29 NOTE — Telephone Encounter (Signed)
Called and left message on patient's home answering machine regarding appointment on Thursday 09/30/15 at 8:00 am with Noberto Retort, PA for Dr. Amedeo Plenty.  Ask that patient call to confirm she received message.

## 2015-09-29 NOTE — Telephone Encounter (Signed)
Pt made aware of appt scheduled at [facility] for tomorrow at [place] her their number to call and confirm the appt. Pt verbalized understanding no additional questions at this time.

## 2015-09-29 NOTE — Telephone Encounter (Signed)
New Message  Pt returned call. She states that someone called to discuss the referral

## 2015-09-30 DIAGNOSIS — M65332 Trigger finger, left middle finger: Secondary | ICD-10-CM | POA: Diagnosis not present

## 2015-09-30 DIAGNOSIS — M1812 Unilateral primary osteoarthritis of first carpometacarpal joint, left hand: Secondary | ICD-10-CM | POA: Diagnosis not present

## 2015-10-13 ENCOUNTER — Other Ambulatory Visit: Payer: Self-pay | Admitting: Family

## 2015-10-13 NOTE — Telephone Encounter (Signed)
Last refill was 09/16/15

## 2015-10-22 ENCOUNTER — Other Ambulatory Visit: Payer: Self-pay | Admitting: General Practice

## 2015-10-22 ENCOUNTER — Ambulatory Visit (INDEPENDENT_AMBULATORY_CARE_PROVIDER_SITE_OTHER): Payer: Medicare Other | Admitting: General Practice

## 2015-10-22 DIAGNOSIS — Z86711 Personal history of pulmonary embolism: Secondary | ICD-10-CM

## 2015-10-22 DIAGNOSIS — I480 Paroxysmal atrial fibrillation: Secondary | ICD-10-CM

## 2015-10-22 DIAGNOSIS — Z5181 Encounter for therapeutic drug level monitoring: Secondary | ICD-10-CM

## 2015-10-22 LAB — POCT INR: INR: 1.5

## 2015-10-22 MED ORDER — WARFARIN SODIUM 5 MG PO TABS: ORAL_TABLET | ORAL | Status: DC

## 2015-10-22 NOTE — Progress Notes (Signed)
Pre visit review using our clinic review tool, if applicable. No additional management support is needed unless otherwise documented below in the visit note. 

## 2015-10-22 NOTE — Progress Notes (Signed)
I have reviewed and agree with the plan. 

## 2015-11-03 ENCOUNTER — Telehealth: Payer: Self-pay

## 2015-11-03 MED ORDER — TEMAZEPAM 15 MG PO CAPS: ORAL_CAPSULE | ORAL | Status: DC

## 2015-11-03 NOTE — Telephone Encounter (Signed)
Requesting refill of temazepam. Last refill was 10/14/15

## 2015-11-03 NOTE — Telephone Encounter (Signed)
Medication printed to be faxed.  

## 2015-11-05 ENCOUNTER — Ambulatory Visit (INDEPENDENT_AMBULATORY_CARE_PROVIDER_SITE_OTHER): Payer: Medicare Other | Admitting: General Practice

## 2015-11-05 DIAGNOSIS — I480 Paroxysmal atrial fibrillation: Secondary | ICD-10-CM | POA: Diagnosis not present

## 2015-11-05 DIAGNOSIS — Z86711 Personal history of pulmonary embolism: Secondary | ICD-10-CM

## 2015-11-05 DIAGNOSIS — M65332 Trigger finger, left middle finger: Secondary | ICD-10-CM | POA: Diagnosis not present

## 2015-11-05 DIAGNOSIS — Z5181 Encounter for therapeutic drug level monitoring: Secondary | ICD-10-CM | POA: Diagnosis not present

## 2015-11-05 DIAGNOSIS — M1812 Unilateral primary osteoarthritis of first carpometacarpal joint, left hand: Secondary | ICD-10-CM | POA: Diagnosis not present

## 2015-11-05 LAB — POCT INR: INR: 3.2

## 2015-11-05 NOTE — Progress Notes (Signed)
Pre visit review using our clinic review tool, if applicable. No additional management support is needed unless otherwise documented below in the visit note. 

## 2015-11-10 DIAGNOSIS — M5136 Other intervertebral disc degeneration, lumbar region: Secondary | ICD-10-CM | POA: Diagnosis not present

## 2015-11-12 ENCOUNTER — Other Ambulatory Visit: Payer: Self-pay | Admitting: Family

## 2015-11-16 DIAGNOSIS — R293 Abnormal posture: Secondary | ICD-10-CM | POA: Diagnosis not present

## 2015-11-16 DIAGNOSIS — M6281 Muscle weakness (generalized): Secondary | ICD-10-CM | POA: Diagnosis not present

## 2015-11-16 DIAGNOSIS — M25561 Pain in right knee: Secondary | ICD-10-CM | POA: Diagnosis not present

## 2015-11-16 DIAGNOSIS — M5416 Radiculopathy, lumbar region: Secondary | ICD-10-CM | POA: Diagnosis not present

## 2015-11-17 ENCOUNTER — Other Ambulatory Visit: Payer: Self-pay

## 2015-11-17 ENCOUNTER — Ambulatory Visit (INDEPENDENT_AMBULATORY_CARE_PROVIDER_SITE_OTHER): Payer: 59 | Admitting: Ophthalmology

## 2015-11-17 DIAGNOSIS — M6281 Muscle weakness (generalized): Secondary | ICD-10-CM | POA: Diagnosis not present

## 2015-11-17 DIAGNOSIS — R293 Abnormal posture: Secondary | ICD-10-CM | POA: Diagnosis not present

## 2015-11-17 DIAGNOSIS — M5416 Radiculopathy, lumbar region: Secondary | ICD-10-CM | POA: Diagnosis not present

## 2015-11-17 DIAGNOSIS — M25561 Pain in right knee: Secondary | ICD-10-CM | POA: Diagnosis not present

## 2015-11-17 MED ORDER — FUROSEMIDE 40 MG PO TABS: ORAL_TABLET | ORAL | Status: DC

## 2015-11-17 MED ORDER — SIMVASTATIN 20 MG PO TABS: 20.0000 mg | ORAL_TABLET | Freq: Every day | ORAL | Status: DC

## 2015-11-23 DIAGNOSIS — M25561 Pain in right knee: Secondary | ICD-10-CM | POA: Diagnosis not present

## 2015-11-23 DIAGNOSIS — M6281 Muscle weakness (generalized): Secondary | ICD-10-CM | POA: Diagnosis not present

## 2015-11-23 DIAGNOSIS — M5416 Radiculopathy, lumbar region: Secondary | ICD-10-CM | POA: Diagnosis not present

## 2015-11-23 DIAGNOSIS — R293 Abnormal posture: Secondary | ICD-10-CM | POA: Diagnosis not present

## 2015-11-25 DIAGNOSIS — M6281 Muscle weakness (generalized): Secondary | ICD-10-CM | POA: Diagnosis not present

## 2015-11-25 DIAGNOSIS — M5416 Radiculopathy, lumbar region: Secondary | ICD-10-CM | POA: Diagnosis not present

## 2015-11-25 DIAGNOSIS — R293 Abnormal posture: Secondary | ICD-10-CM | POA: Diagnosis not present

## 2015-11-25 DIAGNOSIS — M25561 Pain in right knee: Secondary | ICD-10-CM | POA: Diagnosis not present

## 2015-12-01 DIAGNOSIS — M5416 Radiculopathy, lumbar region: Secondary | ICD-10-CM | POA: Diagnosis not present

## 2015-12-01 DIAGNOSIS — M25561 Pain in right knee: Secondary | ICD-10-CM | POA: Diagnosis not present

## 2015-12-01 DIAGNOSIS — M6281 Muscle weakness (generalized): Secondary | ICD-10-CM | POA: Diagnosis not present

## 2015-12-01 DIAGNOSIS — R293 Abnormal posture: Secondary | ICD-10-CM | POA: Diagnosis not present

## 2015-12-03 ENCOUNTER — Other Ambulatory Visit: Payer: Self-pay | Admitting: Family

## 2015-12-03 ENCOUNTER — Ambulatory Visit: Payer: Medicare Other

## 2015-12-03 MED ORDER — TEMAZEPAM 15 MG PO CAPS: ORAL_CAPSULE | ORAL | Status: DC

## 2015-12-03 NOTE — Telephone Encounter (Signed)
Faxed script back to [facility]...[name]

## 2015-12-14 NOTE — Addendum Note (Signed)
Addended by: Earlie Counts F on: 12/14/2015 08:00 AM   Modules accepted: Orders

## 2015-12-16 ENCOUNTER — Other Ambulatory Visit: Payer: Self-pay | Admitting: Family

## 2015-12-21 ENCOUNTER — Telehealth: Payer: Self-pay | Admitting: Internal Medicine

## 2015-12-21 NOTE — Telephone Encounter (Signed)
Agree with assessment. 

## 2015-12-21 NOTE — Telephone Encounter (Signed)
Pt c/o BP issue: STAT if pt c/o blurred vision, one-sided weakness or slurred speech  1. What are your last 5 BP readings? 12/19/15- 202/99; 12/21/15- 196/64, 182/60  2. Are you having any other symptoms (ex. Dizziness, headache, blurred vision, passed out)? No   3. What is your BP issue? BP high- recorded today by @home  physical therapist

## 2015-12-21 NOTE — Telephone Encounter (Signed)
Spoke to caller. She reports BPs elevated over past 3 days. She took a reading Sunday of 202/90. Took her PRN clonidine and BP dropped down to 123456 systolic. Notes this AM at physical therapy she took a pre-exercise BP, it was 180/60. She did not exercise. Was 168/80 when she arrived home. She did not take any clonidine this AM.  She takes valsartan, but after discussion and confirming which form (she had valsartan and valsartan-HCTZ on med list), she notes she'd been out of this medication until yesterday. We updated med list to reflect that she is just taking the valsartan without HCTZ. I advised for her to resume this medication and note that it may take several days before she notes her BPs running lower. In interim, I advised for her to continue use of the PRN clonidine if needed, but to go to urgent care if she develops worrisome symptoms. We discussed this in detail. Pt aware of recommendations and voiced thanks.  Will route to pharmD for any further advice or recommendations.

## 2016-01-22 ENCOUNTER — Other Ambulatory Visit: Payer: Self-pay | Admitting: Family

## 2016-01-26 ENCOUNTER — Other Ambulatory Visit: Payer: Self-pay | Admitting: Family

## 2016-02-07 ENCOUNTER — Other Ambulatory Visit: Payer: Self-pay | Admitting: Family

## 2016-02-09 MED ORDER — TEMAZEPAM 15 MG PO CAPS: ORAL_CAPSULE | ORAL | 1 refills | Status: DC

## 2016-02-09 NOTE — Telephone Encounter (Signed)
Script fax back to [facility]...[name]

## 2016-02-18 ENCOUNTER — Ambulatory Visit (INDEPENDENT_AMBULATORY_CARE_PROVIDER_SITE_OTHER): Payer: Medicare Other | Admitting: General Practice

## 2016-02-18 DIAGNOSIS — Z5181 Encounter for therapeutic drug level monitoring: Secondary | ICD-10-CM

## 2016-02-18 DIAGNOSIS — Z86711 Personal history of pulmonary embolism: Secondary | ICD-10-CM | POA: Diagnosis not present

## 2016-02-18 LAB — POCT INR: INR: 2.9

## 2016-02-18 NOTE — Progress Notes (Signed)
I have reviewed and agree with the plan. 

## 2016-02-22 ENCOUNTER — Other Ambulatory Visit: Payer: Self-pay | Admitting: Family

## 2016-03-01 ENCOUNTER — Encounter: Payer: Self-pay | Admitting: Family

## 2016-03-01 ENCOUNTER — Ambulatory Visit (INDEPENDENT_AMBULATORY_CARE_PROVIDER_SITE_OTHER): Payer: Medicare Other | Admitting: Family

## 2016-03-01 VITALS — BP 190/78 | HR 59 | Temp 98.0°F | Resp 16 | Ht 62.0 in | Wt 138.8 lb

## 2016-03-01 DIAGNOSIS — M150 Primary generalized (osteo)arthritis: Secondary | ICD-10-CM | POA: Diagnosis not present

## 2016-03-01 DIAGNOSIS — M159 Polyosteoarthritis, unspecified: Secondary | ICD-10-CM

## 2016-03-01 MED ORDER — PREDNISONE 20 MG PO TABS: ORAL_TABLET | ORAL | 0 refills | Status: DC

## 2016-03-01 NOTE — Assessment & Plan Note (Signed)
Symptoms and exam are consistent with osteoarthritis of her bilateral hands greater on the left compared to the right that has been refractory to current treatments. Patient requesting referral to osteoarthritic specialist. Increase tramadol. Start prednisone taper to help with inflammation. Continue moist heat and ice. X-rays performed by [place] per patient.

## 2016-03-01 NOTE — Patient Instructions (Signed)
Thank you for choosing [facility].  SUMMARY AND INSTRUCTIONS:  Continue with ice and moist heat.   Medication:  Start taking the prednisone.   Increase tramadol to 50-100 mg with max of 400 mg (8 tablets in 1 day.)  Your prescription(s) have been submitted to your pharmacy or been printed and provided for you. Please take as directed and contact our office if you believe you are having problem(s) with the medication(s) or have any questions.  Follow up:  If your symptoms worsen or fail to improve, please contact our office for further instruction, or in case of emergency go directly to the emergency room at the closest medical facility.   Osteoarthritis Osteoarthritis is a disease that causes soreness and inflammation of a joint. It occurs when the cartilage at the affected joint wears down. Cartilage acts as a cushion, covering the ends of bones where they meet to form a joint. Osteoarthritis is the most common form of arthritis. It often occurs in older people. The joints affected most often by this condition include those in the:  Ends of the fingers.  Thumbs.  Neck.  Lower back.  Knees.  Hips. CAUSES  Over time, the cartilage that covers the ends of bones begins to wear away. This causes bone to rub on bone, producing pain and stiffness in the affected joints.  RISK FACTORS Certain factors can increase your chances of having osteoarthritis, including:  Older age.  Excessive body weight.  Overuse of joints.  Previous joint injury. SIGNS AND SYMPTOMS   Pain, swelling, and stiffness in the joint.  Over time, the joint may lose its normal shape.  Small deposits of bone (osteophytes) may grow on the edges of the joint.  Bits of bone or cartilage can break off and float inside the joint space. This may cause more pain and damage. DIAGNOSIS  Your health care provider will do a physical exam and ask about your symptoms. Various tests may be ordered, such  as:  X-rays of the affected joint.  Blood tests to rule out other types of arthritis. Additional tests may be used to diagnose your condition. TREATMENT  Goals of treatment are to control pain and improve joint function. Treatment plans may include:  A prescribed exercise program that allows for rest and joint relief.  A weight control plan.  Pain relief techniques, such as:  Properly applied heat and cold.  Electric pulses delivered to nerve endings under the skin (transcutaneous electrical nerve stimulation [TENS]).  Massage.  Certain nutritional supplements.  Medicines to control pain, such as:  Acetaminophen.  Nonsteroidal anti-inflammatory drugs (NSAIDs), such as naproxen.  Narcotic or central-acting agents, such as tramadol.  Corticosteroids. These can be given orally or as an injection.  Surgery to reposition the bones and relieve pain (osteotomy) or to remove loose pieces of bone and cartilage. Joint replacement may be needed in advanced states of osteoarthritis. HOME CARE INSTRUCTIONS   Take medicines only as directed by your health care provider.  Maintain a healthy weight. Follow your health care provider's instructions for weight control. This may include dietary instructions.  Exercise as directed. Your health care provider can recommend specific types of exercise. These may include:  Strengthening exercises. These are done to strengthen the muscles that support joints affected by arthritis. They can be performed with weights or with exercise bands to add resistance.  Aerobic activities. These are exercises, such as brisk walking or low-impact aerobics, that get your heart pumping.  Range-of-motion activities. These  keep your joints limber.  Balance and agility exercises. These help you maintain daily living skills.  Rest your affected joints as directed by your health care provider.  Keep all follow-up visits as directed by your health care  provider. SEEK MEDICAL CARE IF:   Your skin turns red.  You develop a rash in addition to your joint pain.  You have worsening joint pain.  You have a fever along with joint or muscle aches. SEEK IMMEDIATE MEDICAL CARE IF:  You have a significant loss of weight or appetite.  You have night sweats. [place] of Arthritis and Musculoskeletal and Skin Diseases: www.niams.SouthExposed.es  [employer] on Aging: http://kim-miller.com/  American College of Rheumatology: www.rheumatology.org   This information is not intended to replace advice given to you by your health care provider. Make sure you discuss any questions you have with your health care provider.   Document Released: 06/19/2005 Document Revised: 07/10/2014 Document Reviewed: 02/24/2013 Elsevier Interactive Patient Education [employer].

## 2016-03-01 NOTE — Progress Notes (Signed)
Subjective:    Patient ID: [name], female    DOB: Aug 19, 1940, 75 y.o.   MRN: [id number]  Chief Complaint  Patient presents with  . Hand Pain    extreme hand pain mainly in the left hand, has been trying to take turmeric and [facility]    HPI:  [name] is a 75 y.o. female who  has a past medical history of Allergy; Anemia (2005); Arthritis; Atrial fibrillation ([place]); Broken back (2013); CHF (congestive heart failure) ([place]); Esophageal cancer ([place]) (05/06/15); GERD (gastroesophageal reflux disease); H/O iron deficiency; HH (hiatus hernia) (2008); Hypertension; PONV (postoperative nausea and vomiting); Pulmonary emboli ([place]) (2008, 2012); Stroke [facility]); and Transfusion history. and presents today for an office visit.   1.) Hand pains - Continues to experience the associated symptom of bilateral hand pain that has progressively worsening over the past 3 months. Describes the pain as achy very sensitive to to the touch. Modifying factors include a compounded cream, tramadol, tumeric, [facility] which have not [facility] very much. It is effecting her ability to grip and functionality. Denies any new trauma or injury. She has been to [facility] [employer] with cortisone injections in her thumb and fingers which only provided minimal relief.   Allergies  Allergen Reactions  . Iodinated Diagnostic Agents Anaphylaxis    IPD dye Info given by patient  . Ioxaglate Anaphylaxis    Info given by patient  . Milk-Related Compounds Anaphylaxis    Lactose intolerance  . Whey     Lactose intolerance  . Darvon [Propoxyphene] Rash  . Hydralazine Anxiety    Facial flushing, pt prefers not to use it.      Outpatient Medications Prior to Visit  Medication Sig Dispense Refill  . acetaminophen (TYLENOL) 500 MG tablet Take 1,000 mg by mouth at bedtime as needed for moderate pain.    [name] amLODipine (NORVASC) 10 MG tablet TAKE ONE TABLET  BY MOUTH ONCE DAILY. 90 tablet 1  . cloNIDine (CATAPRES) 0.1 MG tablet Take 1 tablet (0.1 mg total) by mouth daily as needed (high blood pressure, will take if levels are 180/90). (Patient taking differently: Take 0.1 mg by mouth daily as needed (high blood pressure, take if blood pressure is >179/99.). ) 30 tablet 0  . furosemide (LASIX) 40 MG tablet TAKE 1 TO 2 TABLETS BY MOUTH ONCE DAILY. 30 tablet 1  . gabapentin (NEURONTIN) 100 MG capsule TAKE 1 CAPSULE BY MOUTH THREE TIMES A DAY. 90 capsule 3  . meclizine (ANTIVERT) 25 MG tablet Take 1 tablet (25 mg total) by mouth 3 (three) times daily as needed for dizziness. 30 tablet 0  . metoprolol (LOPRESSOR) 50 MG tablet TAKE 1 TABLET BY MOUTH TWICE DAILY. 180 tablet 1  . NONFORMULARY OR COMPOUNDED ITEM Diclofenac/Baclofen/Cyclobenzaprine/Gabapentin/Lidocaine 3/2/2/6/2.5% Cream  Apply to affected area 2-3 times per day as needed for arthritic pain. (Patient taking differently: Apply 1 application topically daily as needed (for arthritis). Diclofenac/Baclofen/Cyclobenzaprine/Gabapentin/Lidocaine 3/2/2/6/2.5% Cream  Apply to affected area 2-3 times per day as needed for arthritic pain.) 1 each 1  . ondansetron (ZOFRAN) 4 MG tablet Take 4 mg by mouth every 8 (eight) hours as needed for nausea or vomiting.    . pantoprazole (PROTONIX) 40 MG tablet TAKE 1 TABLET BY MOUTH TWICE DAILY. 60 tablet 2  . simvastatin (ZOCOR) 20 MG tablet TAKE (1) TABLET BY MOUTH AT BEDTIME. 30 tablet 2  . temazepam (RESTORIL)  15 MG capsule TAKE 1 CAPSULE BY MOUTH AT BEDTIME. 30 capsule 1  . traMADol (ULTRAM) 50 MG tablet Take 1 tablet (50 mg total) by mouth every 8 (eight) hours. 90 tablet 0  . valsartan (DIOVAN) 320 MG tablet TAKE 1/2 TABLET BY MOUTH ONCE DAILY. 45 tablet 2  . vitamin B-12 1000 MCG tablet Take 1 tablet (1,000 mcg total) by mouth daily. 30 tablet 0  . warfarin (COUMADIN) 5 MG tablet 2.5 mg on Wednesday, 5 mg all other days 90 tablet 1   No facility-administered  medications prior to visit.      Review of Systems  Constitutional: Negative for chills and fever.  Musculoskeletal: Positive for arthralgias.      Objective:    BP (!) 190/78 (BP Location: Left Arm, Patient Position: Sitting, Cuff Size: Normal)   Pulse (!) 59   Temp 98 F (36.7 C) (Oral)   Resp 16   Ht 5\' 2"  (1.575 m)   Wt 138 lb 12.8 oz (63 kg)   SpO2 95%   BMI 25.39 kg/m  Nursing note and vital signs reviewed.   Physical Exam  Constitutional: She is oriented to person, place, and time. She appears well-developed and well-nourished. No distress.  Cardiovascular: Normal rate, regular rhythm, normal heart sounds and intact distal pulses.   Pulmonary/Chest: Effort normal and breath sounds normal.  Musculoskeletal:  Bilateral hands - No obvious discoloration or edema. There is generalized tenderness over the metacarpals. ROM appears normal with mild decreased grip. Capillary refill and pulses are intact and appropriate.   Neurological: She is alert and oriented to person, place, and time.  Skin: Skin is warm and dry.  Psychiatric: She has a normal mood and affect. Her behavior is normal. Judgment and thought content normal.       Assessment & Plan:   Problem List Items Addressed This Visit      Musculoskeletal and Integument   Osteoarthritis - Primary    Symptoms and exam are consistent with osteoarthritis of her bilateral hands greater on the left compared to the right that has been refractory to current treatments. Patient requesting referral to osteoarthritic specialist. Increase tramadol. Start prednisone taper to help with inflammation. Continue moist heat and ice. X-rays performed by [place] per patient.       Relevant Medications   predniSONE (DELTASONE) 20 MG tablet    Other Visit Diagnoses   None.      I am having [name] start on predniSONE. I am also having her maintain her meclizine, NONFORMULARY OR COMPOUNDED ITEM, cloNIDine,  cyanocobalamin, ondansetron, acetaminophen, warfarin, furosemide, metoprolol, amLODipine, valsartan, traMADol, pantoprazole, simvastatin, temazepam, gabapentin, and Turmeric.   Meds ordered this encounter  Medications  . Turmeric 450 MG CAPS    Sig: Take 450 mg by mouth 4 (four) times daily.  . predniSONE (DELTASONE) 20 MG tablet    Sig: Take 3 tablets by mouth daily for 4 days, then 2 tablets by mouth daily for 4 days; then 1 tablet daily by mouth for 4 days.    Dispense:  24 tablet    Refill:  0    Order Specific Question:   Supervising Provider    Answer:   Pricilla Holm A [id number]     Follow-up: Return in about 1 month (around 04/01/2016), or if symptoms worsen or fail to improve.  Mauricio Po, [name]

## 2016-03-23 DIAGNOSIS — M654 Radial styloid tenosynovitis [de Quervain]: Secondary | ICD-10-CM | POA: Insufficient documentation

## 2016-03-23 DIAGNOSIS — M65332 Trigger finger, left middle finger: Secondary | ICD-10-CM | POA: Insufficient documentation

## 2016-03-30 ENCOUNTER — Other Ambulatory Visit: Payer: Self-pay | Admitting: Family

## 2016-03-31 ENCOUNTER — Ambulatory Visit (INDEPENDENT_AMBULATORY_CARE_PROVIDER_SITE_OTHER): Payer: Medicare Other | Admitting: General Practice

## 2016-03-31 DIAGNOSIS — Z86711 Personal history of pulmonary embolism: Secondary | ICD-10-CM

## 2016-03-31 DIAGNOSIS — Z5181 Encounter for therapeutic drug level monitoring: Secondary | ICD-10-CM

## 2016-03-31 LAB — POCT INR: INR: 2.5

## 2016-03-31 NOTE — Progress Notes (Signed)
I have reviewed and agree with the plan. 

## 2016-04-17 ENCOUNTER — Other Ambulatory Visit: Payer: Self-pay | Admitting: Family

## 2016-04-19 ENCOUNTER — Ambulatory Visit (INDEPENDENT_AMBULATORY_CARE_PROVIDER_SITE_OTHER): Payer: Medicare Other | Admitting: Internal Medicine

## 2016-04-19 ENCOUNTER — Encounter: Payer: Self-pay | Admitting: Internal Medicine

## 2016-04-19 ENCOUNTER — Ambulatory Visit
Admission: RE | Admit: 2016-04-19 | Discharge: 2016-04-19 | Disposition: A | Discharge status: Home / Self Care | Payer: Medicare Other | Source: Ambulatory Visit | Attending: Internal Medicine | Admitting: Internal Medicine

## 2016-04-19 VITALS — BP 167/61 | HR 66 | Ht 62.0 in | Wt 141.0 lb

## 2016-04-19 DIAGNOSIS — I480 Paroxysmal atrial fibrillation: Secondary | ICD-10-CM

## 2016-04-19 DIAGNOSIS — R05 Cough: Secondary | ICD-10-CM | POA: Diagnosis not present

## 2016-04-19 DIAGNOSIS — I10 Essential (primary) hypertension: Secondary | ICD-10-CM | POA: Diagnosis not present

## 2016-04-19 DIAGNOSIS — I350 Nonrheumatic aortic (valve) stenosis: Secondary | ICD-10-CM

## 2016-04-19 DIAGNOSIS — R0602 Shortness of breath: Secondary | ICD-10-CM | POA: Diagnosis not present

## 2016-04-19 MED ORDER — FUROSEMIDE 40 MG PO TABS: ORAL_TABLET | ORAL | 10 refills | Status: DC

## 2016-04-19 NOTE — Progress Notes (Signed)
OFFICE NOTE  Chief Complaint:  Routine follow-up  Primary Care Physician: Mauricio Po, [name]  HPI:  [name] is a pleasant 75 year old female kindly referred to me be Dr. Ardeth Perfect. She reports a past medical history significant for atrial fibrillation and congestive heart failure, which occurred around the same time and probably 2012. She vaguely recalls seeing Dr. Irish Lack at that time.  She was started on warfarin therapy and no attempts were made to get her back into rhythm as she was paroxysmal. She does has a long-standing history of hypertension and dyslipidemia. She apparently developed heart failure or however has never had reassessment of her ejection fraction. She subsequently was having health issues and decided to move with her son to live in [place]. She says at that time she had some problems with chest pain and ultimately underwent a stress test. This was associated with an anaphylactic type reaction and despite their reassurances she will "no longer have any more stress test". She says she has a severe allergy to IV contrast dye which causes throat swelling as well as milk which also causes the same symptoms. Apparently she also underwent cardiac catheterization however she says that only medical therapy was recommended. This was in [place], [place].  We are trying to request records from there as well. She subsequently moved with her son to [facility], however he lost his job and she recently moved back to [place]. Currently she denies any significant shortness of breath but does report some mild leg edema which is slightly worse and occasional pain in her left chest and left arm over the last several days. She's also had pain in the right chest. None of those symptoms are worse with exertion or relieved by rest or medication.   [name] returns today for followup. She again appears upset and quite nervous. Blood pressure recently has been significantly elevated. In  fact she went to the emergency room and was hospitalized for blood pressure control. Although it seems that her blood pressure more easily was controlled once her oral medicines were restarted. Subsequently she's been started on clonidine is now up to twice a day. Blood pressure today was still high at [id number] systolic. She reports some occasional chest discomfort, mostly at rest and reports not being active enough to know if she has symptoms with exertion. She also has pain all over body significant low back pain and is seeing a specialist about that in the next few weeks.  I saw [name] back in the office today. She reports some improvement in her chest discomfort and I think this is related to blood pressure. The recent switch in her blood pressure medicines indicate a marked improvement in her pressures in she is noted to be 130/66 today. She does get some slight shortness of breath with exertion and no regular chest discomfort. At this time there is not clear evidence to pursue a further workup although I have a low threshold for further cardiac workup if she should become more symptomatic. She is in sinus rhythm and seems to be maintaining that.  [name] returns today for follow-up. She's had a remarkable past 6 months. Unfortunately she was diagnosed with a gastric cancer which was considered very high risk to resect. She was referred to [facility] for an endoscopic resection which was successful with clean margins. Prior to that she underwent cardiac evaluation by [facility] cardiology including a nuclear stress test which was low risk and didn't involve  exercise. She reports that during the last minute of exercise she went into A. fib. She's had recurrent A. fib on and off and in fact was just hospitalized again for A. fib a few weeks ago which converted spontaneously back to sinus rhythm, prior to cardiology is evaluating her. She was then directed to follow-up with me. She has fortunately been on  warfarin with therapeutic INRs. Of note, I did review her cardiac catheterization which was performed at an outside hospital 2014 which did demonstrate an 80% mid RCA stenosis as well as a 70% OM lesion and some mild to moderate disease of other vessels. At the time no stent was recommended, which leads me to believe these may be smaller vessels. The fact that her most recent stress test was again negative suggest that they are not flow limiting even though they would seem significant. Also, today she is describing left hand pain. She says that there is exquisite tenderness the left hand and it's difficult for her to extend her finger. She often notes that her left middle finger gets stuck. On palpation I do not notice any significant arthritis of the joints or nodules on the tendons.  04/19/2016  [name] returns today for follow-up. She is concerned about shortness of breath which is been progressive. She reports recently she's had some loss of vocal power and noted some upper airway wheezing. We and related with her on the office today and her oxygen saturations were noted to be 97% at rest and that came down to 95% with ambulation. EKG shows sinus rhythm at 66. Blood pressure is mildly elevated today. She has a nonproductive cough but denies fever, chills or sweats. Weight is up to 141 from 130 pounds 6 months ago.  PMHx:  Past Medical History:  Diagnosis Date  . Allergy   . Anemia 2005   generally microcytic, transfusions in [address], 2012, 02/2015, 05/2015  . Arthritis   . Atrial fibrillation ([place])   . Broken back 2013   chronic back pain.   . CHF (congestive heart failure) ([place])   . Esophageal cancer ([place]) 05/06/15   adenocarcinoma ge junction  . GERD (gastroesophageal reflux disease)   . H/O iron deficiency    05-06-15 iron infusion ([facility])  . HH (hiatus hernia) 2008   large with associated erosions.   . Hypertension   . PONV (postoperative nausea and vomiting)   . Pulmonary emboli  ([place]) 2008, 2012  . Stroke ([place])   . Transfusion history    2 units transfused 05-06-15([facility])    Past Surgical History:  Procedure Laterality Date  . APPENDECTOMY  1950s  . CARPAL TUNNEL RELEASE Bilateral 1990s  . [place] SURGERY  2014  . CHOLECYSTECTOMY  1980s   open  . COLONOSCOPY N/A 02/17/2015   Procedure: COLONOSCOPY;  Surgeon: [name], MD;  Location: [facility] ENDOSCOPY;  Service: Endoscopy;  Laterality: N/A;  . ESOPHAGOGASTRODUODENOSCOPY N/A 02/16/2015   Procedure: ESOPHAGOGASTRODUODENOSCOPY (EGD);  Surgeon: [name], MD;  Location: [facility] ENDOSCOPY;  Service: Endoscopy;  Laterality: N/A;  . ESOPHAGOGASTRODUODENOSCOPY N/A 05/06/2015   Procedure: ESOPHAGOGASTRODUODENOSCOPY (EGD);  Surgeon: [name], MD;  Location: [facility] ENDOSCOPY;  Service: Endoscopy;  Laterality: N/A;  . EUS N/A 05/20/2015   Procedure: UPPER ENDOSCOPIC ULTRASOUND (EUS) LINEAR;  Surgeon: [name], MD;  Location: [facility] ENDOSCOPY;  Service: Endoscopy;  Laterality: N/A;  . exploratory lab  1950s or 1960s  . GIVENS CAPSULE STUDY N/A 05/06/2015   Procedure: GIVENS CAPSULE STUDY;  Surgeon: [name], MD;  Location: [facility] ENDOSCOPY;  Service: Endoscopy;  Laterality: N/A;  . lumbar back surgery  2012  . [place] SURGERY  1991  . TONSILLECTOMY AND ADENOIDECTOMY  1960s    FAMHx:  Family History  Problem Relation Age of Onset  . Stroke Mother   . Heart disease Mother   . Emphysema Father   . Ovarian cancer Sister   . Stroke Sister   . Other Child     died at birth    SOCHx:   reports that she has never smoked. She has never used smokeless tobacco. She reports that she does not drink alcohol or use drugs.  ALLERGIES:  Allergies  Allergen Reactions  . Iodinated Diagnostic Agents Anaphylaxis    IPD dye Info given by patient  . Ioxaglate Anaphylaxis    Info given by patient  . Milk-Related Compounds Anaphylaxis    Lactose intolerance  . Red Dye Anaphylaxis  . Whey     Lactose intolerance    . Darvon [Propoxyphene] Rash  . Hydralazine Anxiety    Facial flushing, pt prefers not to use it.     ROS: Pertinent items noted in HPI and remainder of comprehensive ROS otherwise negative.  HOME MEDS: Current Outpatient Prescriptions  Medication Sig Dispense Refill  . acetaminophen (TYLENOL) 500 MG tablet Take 1,000 mg by mouth at bedtime as needed for moderate pain.    [name] amLODipine (NORVASC) 10 MG tablet TAKE ONE TABLET BY MOUTH ONCE DAILY. 90 tablet 1  . cloNIDine (CATAPRES) 0.1 MG tablet Take 1 tablet (0.1 mg total) by mouth daily as needed (high blood pressure, will take if levels are 180/90). (Patient taking differently: Take 0.1 mg by mouth daily as needed (high blood pressure, take if blood pressure is >179/99.). ) 30 tablet 0  . gabapentin (NEURONTIN) 100 MG capsule TAKE 1 CAPSULE BY MOUTH THREE TIMES A DAY. 90 capsule 3  . meclizine (ANTIVERT) 25 MG tablet Take 1 tablet (25 mg total) by mouth 3 (three) times daily as needed for dizziness. 30 tablet 0  . metoprolol (LOPRESSOR) 50 MG tablet TAKE 1 TABLET BY MOUTH TWICE DAILY. 180 tablet 1  . NONFORMULARY OR COMPOUNDED ITEM Diclofenac/Baclofen/Cyclobenzaprine/Gabapentin/Lidocaine 3/2/2/6/2.5% Cream  Apply to affected area 2-3 times per day as needed for arthritic pain. (Patient taking differently: Apply 1 application topically daily as needed (for arthritis). Diclofenac/Baclofen/Cyclobenzaprine/Gabapentin/Lidocaine 3/2/2/6/2.5% Cream  Apply to affected area 2-3 times per day as needed for arthritic pain.) 1 each 1  . ondansetron (ZOFRAN) 4 MG tablet Take 4 mg by mouth every 8 (eight) hours as needed for nausea or vomiting.    . pantoprazole (PROTONIX) 40 MG tablet TAKE 1 TABLET BY MOUTH TWICE DAILY. 60 tablet 2  . predniSONE (DELTASONE) 20 MG tablet Take 3 tablets by mouth daily for 4 days, then 2 tablets by mouth daily for 4 days; then 1 tablet daily by mouth for 4 days. 24 tablet 0  . simvastatin (ZOCOR) 20 MG tablet TAKE (1)  TABLET BY MOUTH AT BEDTIME. 30 tablet 2  . temazepam (RESTORIL) 15 MG capsule TAKE 1 CAPSULE BY MOUTH AT BEDTIME. 30 capsule 2  . traMADol (ULTRAM) 50 MG tablet Take 1 tablet (50 mg total) by mouth every 8 (eight) hours. 90 tablet 0  . Turmeric 450 MG CAPS Take 450 mg by mouth 4 (four) times daily.    . valsartan (DIOVAN) 320 MG tablet TAKE 1/2 TABLET BY MOUTH ONCE DAILY. 45 tablet 2  .  vitamin B-12 1000 MCG tablet Take 1 tablet (1,000 mcg total) by mouth daily. 30 tablet 0  . warfarin (COUMADIN) 5 MG tablet TAKE 1/2 TABLET BY MOUTH ON WEDNESDAY AND 1 TABLET ALL OTHER DAYS. 90 tablet 0  . furosemide (LASIX) 40 MG tablet TAKE 1 TO 2 TABLETS BY MOUTH ONCE DAILY. 30 tablet 10   No current facility-administered medications for this visit.     LABS/IMAGING: No results found for this or any previous visit (from the past 48 hour(s)). No results found.  VITALS: BP (!) 167/61   Pulse 66   Ht 5\' 2"  (1.575 m)   Wt 141 lb (64 kg)   BMI 25.79 kg/m   EXAM: General appearance: alert and no distress Neck: no carotid bruit, no JVD and thyroid not enlarged, symmetric, no tenderness/mass/nodules Lungs: clear to auscultation bilaterally Heart: regular rate and rhythm, S1, S2 normal, no murmur, click, rub or gallop Abdomen: soft, non-tender; bowel sounds normal; no masses,  no organomegaly Extremities: varicose veins noted and Left hand pain with tenderness on palpation Pulses: 2+ and symmetric Skin: Skin color, texture, turgor normal. No rashes or lesions Neurologic: Grossly normal Psych: Pleasant  EKG: Normal sinus rhythm at 66  ASSESSMENT: 1. Paroxysmal atrial fibrillation on warfarin 2. History of diastolic congestive heart failure - EF 60-65% on echo 3. Hypertension - controlled 4. Dyslipidemia 5. Mild aortic stenosis 6. Right hand pain with contracture 7. Recent GI cancer/resected  PLAN: 1.   [name] has had some progressive dyspnea on exertion but has relatively clear lungs.  She has had 11 pound weight gain over the past 6 months, but no significant edema. She sounds like she has an upper airway wheeze and a low vocal power. It seems that she is using some accessory muscles to breathe in the office. Surprisingly, her oxygen saturations at rest and with exertion are not significantly lowered. I would like to repeat an echocardiogram to see if is been any change in LV function as well as the fact that she had mild aortic stenosis. Will also obtain a PA and lateral chest x-ray today. If there are signs of pulmonary edema or worsening congestion, she may need to increase her Lasix from 40 mg daily to twice daily.  Follow-up in 6 months.  Pixie Casino, MD, [facility] Attending Cardiologist [place] [facility] [place] 04/19/2016, 5:58 PM

## 2016-04-19 NOTE — Patient Instructions (Signed)
Medication Instructions:  Your physician recommends that you continue on your current medications as directed. Please refer to the Current Medication list given to you today.  Labwork: none  Testing/Procedures: Your physician has requested that you have an echocardiogram. Echocardiography is a painless test that uses sound waves to create images of your heart. It provides your doctor with information about the size and shape of your heart and how well your heart's chambers and valves are working. This procedure takes approximately one hour. There are no restrictions for this procedure.  A chest x-ray takes a picture of the organs and structures inside the chest, including the heart, lungs, and blood vessels. This test can show several things, including, whether the heart is enlarges; whether fluid is building up in the lungs; and whether pacemaker / defibrillator leads are still in place.  Follow-Up: Your physician wants you to follow-up in: 6 months with Dr Debara Pickett. You will receive a reminder letter in the mail two months in advance. If you don't receive a letter, please call our office to schedule the follow-up appointment.  Any Other Special Instructions Will Be Listed Below (If Applicable).     If you need a refill on your cardiac medications before your next appointment, please call your pharmacy.

## 2016-04-20 ENCOUNTER — Telehealth: Payer: Self-pay | Admitting: Family

## 2016-04-20 NOTE — Telephone Encounter (Signed)
Pt called and has some questions about her appt she had with the cardiologist .  He had told her that she may need to fu with her primary care.She was not sure for what ?

## 2016-04-21 NOTE — Telephone Encounter (Signed)
Made a follow up appointment for pt

## 2016-04-24 ENCOUNTER — Encounter: Payer: Self-pay | Admitting: Family

## 2016-04-24 ENCOUNTER — Other Ambulatory Visit (INDEPENDENT_AMBULATORY_CARE_PROVIDER_SITE_OTHER): Payer: Medicare Other

## 2016-04-24 ENCOUNTER — Ambulatory Visit (INDEPENDENT_AMBULATORY_CARE_PROVIDER_SITE_OTHER): Payer: Medicare Other | Admitting: Family

## 2016-04-24 VITALS — BP 160/82 | HR 64 | Temp 97.8°F | Resp 22 | Ht 62.0 in | Wt 137.0 lb

## 2016-04-24 DIAGNOSIS — A048 Other specified bacterial intestinal infections: Secondary | ICD-10-CM | POA: Diagnosis not present

## 2016-04-24 DIAGNOSIS — K219 Gastro-esophageal reflux disease without esophagitis: Secondary | ICD-10-CM

## 2016-04-24 LAB — H. PYLORI ANTIBODY, IGG: H PYLORI IGG: POSITIVE — AB

## 2016-04-24 MED ORDER — SUCRALFATE 1 G PO TABS: 1.0000 g | ORAL_TABLET | Freq: Three times a day (TID) | ORAL | 0 refills | Status: DC

## 2016-04-24 NOTE — Progress Notes (Signed)
Subjective:    Patient ID: [name], female    DOB: Jun 17, 1941, 75 y.o.   MRN: [id number]  Chief Complaint  Patient presents with  . Shortness of Breath    x2 weeks, states she has had SOB and has only gotten    HPI:  [name] is a 75 y.o. female who  has a past medical history of Allergy; Anemia (2005); Arthritis; Atrial fibrillation ([place]); Broken back (2013); CHF (congestive heart failure) ([place]); Esophageal cancer ([place]) (05/06/15); GERD (gastroesophageal reflux disease); H/O iron deficiency; HH (hiatus hernia) (2008); Hypertension; PONV (postoperative nausea and vomiting); Pulmonary emboli ([place]) (2008, 2012); Stroke [facility]); and Transfusion history. and presents today for an office visit.  Shortness of breath - This is a new problem. Associated symptom of shortness of breath with wheezing has been going on for about 2 weeks. Course of the symptoms are generally worsening with time. Describes increased symptoms of reflux. Evaluated by cardiology and maintained a good saturation with activity and chest x-ray with moderate sized hiatal hernia and no active lung disease. Modifying factors include pantoprazole which she takes 2 per day which does not help with her symptoms. Symptoms are constant with burping and burning sensation. Has also has some mild voice changes. There has been some abdominal pain and nausea with no vomiting.   Allergies  Allergen Reactions  . Iodinated Diagnostic Agents Anaphylaxis    IPD dye Info given by patient  . Ioxaglate Anaphylaxis    Info given by patient  . Milk-Related Compounds Anaphylaxis    Lactose intolerance  . Red Dye Anaphylaxis  . Whey     Lactose intolerance  . Darvon [Propoxyphene] Rash  . Hydralazine Anxiety    Facial flushing, pt prefers not to use it.       Outpatient Medications Prior to Visit  Medication Sig Dispense Refill  . acetaminophen (TYLENOL) 500 MG tablet Take 1,000 mg by mouth at bedtime as needed for moderate  pain.    [name] amLODipine (NORVASC) 10 MG tablet TAKE ONE TABLET BY MOUTH ONCE DAILY. 90 tablet 1  . cloNIDine (CATAPRES) 0.1 MG tablet Take 1 tablet (0.1 mg total) by mouth daily as needed (high blood pressure, will take if levels are 180/90). (Patient taking differently: Take 0.1 mg by mouth daily as needed (high blood pressure, take if blood pressure is >179/99.). ) 30 tablet 0  . furosemide (LASIX) 40 MG tablet TAKE 1 TO 2 TABLETS BY MOUTH ONCE DAILY. 30 tablet 10  . gabapentin (NEURONTIN) 100 MG capsule TAKE 1 CAPSULE BY MOUTH THREE TIMES A DAY. 90 capsule 3  . meclizine (ANTIVERT) 25 MG tablet Take 1 tablet (25 mg total) by mouth 3 (three) times daily as needed for dizziness. 30 tablet 0  . metoprolol (LOPRESSOR) 50 MG tablet TAKE 1 TABLET BY MOUTH TWICE DAILY. 180 tablet 1  . NONFORMULARY OR COMPOUNDED ITEM Diclofenac/Baclofen/Cyclobenzaprine/Gabapentin/Lidocaine 3/2/2/6/2.5% Cream  Apply to affected area 2-3 times per day as needed for arthritic pain. (Patient taking differently: Apply 1 application topically daily as needed (for arthritis). Diclofenac/Baclofen/Cyclobenzaprine/Gabapentin/Lidocaine 3/2/2/6/2.5% Cream  Apply to affected area 2-3 times per day as needed for arthritic pain.) 1 each 1  . ondansetron (ZOFRAN) 4 MG tablet Take 4 mg by mouth every 8 (eight) hours as needed for nausea or vomiting.    . pantoprazole (PROTONIX) 40 MG tablet TAKE 1 TABLET BY MOUTH TWICE DAILY. 60 tablet 2  . predniSONE (DELTASONE) 20 MG tablet Take 3 tablets by mouth daily  for 4 days, then 2 tablets by mouth daily for 4 days; then 1 tablet daily by mouth for 4 days. 24 tablet 0  . simvastatin (ZOCOR) 20 MG tablet TAKE (1) TABLET BY MOUTH AT BEDTIME. 30 tablet 2  . temazepam (RESTORIL) 15 MG capsule TAKE 1 CAPSULE BY MOUTH AT BEDTIME. 30 capsule 2  . traMADol (ULTRAM) 50 MG tablet Take 1 tablet (50 mg total) by mouth every 8 (eight) hours. 90 tablet 0  . Turmeric 450 MG CAPS Take 450 mg by mouth 4 (four)  times daily.    . valsartan (DIOVAN) 320 MG tablet TAKE 1/2 TABLET BY MOUTH ONCE DAILY. 45 tablet 2  . vitamin B-12 1000 MCG tablet Take 1 tablet (1,000 mcg total) by mouth daily. 30 tablet 0  . warfarin (COUMADIN) 5 MG tablet TAKE 1/2 TABLET BY MOUTH ON WEDNESDAY AND 1 TABLET ALL OTHER DAYS. 90 tablet 0   No facility-administered medications prior to visit.       Past Surgical History:  Procedure Laterality Date  . APPENDECTOMY  1950s  . CARPAL TUNNEL RELEASE Bilateral 1990s  . [place] SURGERY  2014  . CHOLECYSTECTOMY  1980s   open  . COLONOSCOPY N/A 02/17/2015   Procedure: COLONOSCOPY;  Surgeon: [name], MD;  Location: [facility] ENDOSCOPY;  Service: Endoscopy;  Laterality: N/A;  . ESOPHAGOGASTRODUODENOSCOPY N/A 02/16/2015   Procedure: ESOPHAGOGASTRODUODENOSCOPY (EGD);  Surgeon: [name], MD;  Location: [facility] ENDOSCOPY;  Service: Endoscopy;  Laterality: N/A;  . ESOPHAGOGASTRODUODENOSCOPY N/A 05/06/2015   Procedure: ESOPHAGOGASTRODUODENOSCOPY (EGD);  Surgeon: [name], MD;  Location: [facility] ENDOSCOPY;  Service: Endoscopy;  Laterality: N/A;  . EUS N/A 05/20/2015   Procedure: UPPER ENDOSCOPIC ULTRASOUND (EUS) LINEAR;  Surgeon: [name], MD;  Location: [facility] ENDOSCOPY;  Service: Endoscopy;  Laterality: N/A;  . exploratory lab  1950s or 1960s  . GIVENS CAPSULE STUDY N/A 05/06/2015   Procedure: GIVENS CAPSULE STUDY;  Surgeon: [name], MD;  Location: [facility] ENDOSCOPY;  Service: Endoscopy;  Laterality: N/A;  . lumbar back surgery  2012  . [place] SURGERY  1991  . TONSILLECTOMY AND ADENOIDECTOMY  1960s      Past Medical History:  Diagnosis Date  . Allergy   . Anemia 2005   generally microcytic, transfusions in [address], 2012, 02/2015, 05/2015  . Arthritis   . Atrial fibrillation ([place])   . Broken back 2013   chronic back pain.   . CHF (congestive heart failure) ([place])   . Esophageal cancer ([place]) 05/06/15   adenocarcinoma ge junction  . GERD (gastroesophageal reflux  disease)   . H/O iron deficiency    05-06-15 iron infusion ([facility])  . HH (hiatus hernia) 2008   large with associated erosions.   . Hypertension   . PONV (postoperative nausea and vomiting)   . Pulmonary emboli ([place]) 2008, 2012  . Stroke ([place])   . Transfusion history    2 units transfused 05-06-15([facility])      Review of Systems  Constitutional: Negative for chills and fever.  Respiratory: Positive for shortness of breath. Negative for chest tightness.   Cardiovascular: Negative for chest pain, palpitations and leg swelling.  Gastrointestinal: Positive for nausea. Negative for vomiting.      Objective:    BP (!) 160/82 (BP Location: Left Arm, Patient Position: Sitting, Cuff Size: Normal)   Pulse 64   Temp 97.8 F (36.6 C) (Oral)   Resp (!) 22   Ht 5\' 2"  (1.575 m)   Wt  137 lb (62.1 kg)   SpO2 97%   BMI 25.06 kg/m  Nursing note and vital signs reviewed.  Physical Exam  Constitutional: She is oriented to person, place, and time. She appears well-developed and well-nourished. No distress.  Cardiovascular: Normal rate, regular rhythm, normal heart sounds and intact distal pulses.   Pulmonary/Chest: Effort normal and breath sounds normal. She has no wheezes. She has no rales. She exhibits no tenderness.  Abdominal: Normal appearance and bowel sounds are normal. She exhibits no mass. There is tenderness in the epigastric area. There is no rigidity, no rebound, no guarding, no tenderness at McBurney's point and negative Murphy's sign.  Neurological: She is alert and oriented to person, place, and time.  Skin: Skin is warm. She is not diaphoretic.  Psychiatric: She has a normal mood and affect. Her behavior is normal. Judgment and thought content normal.       Assessment & Plan:   Problem List Items Addressed This Visit      Digestive   GERD (gastroesophageal reflux disease) - Primary    Shortness of breath with concern for gastroesophageal reflux that is status post malignancy.  There is also concern for peptic ulcer disease. Continue current dosage of pantoprazole. Start sucralfate. Obtain H. Pylori. Recommend follow up with GI if symptoms do not improve.      Relevant Medications   sucralfate (CARAFATE) 1 g tablet   Other Relevant Orders   H. pylori antibody, IgG (Completed)    Other Visit Diagnoses   None.      I am having Ms. Ropp start on sucralfate. I am also having her maintain her meclizine, NONFORMULARY OR COMPOUNDED ITEM, cloNIDine, cyanocobalamin, ondansetron, acetaminophen, metoprolol, amLODipine, valsartan, traMADol, pantoprazole, simvastatin, gabapentin, Turmeric, predniSONE, temazepam, warfarin, and furosemide.   Meds ordered this encounter  Medications  . sucralfate (CARAFATE) 1 g tablet    Sig: Take 1 tablet (1 g total) by mouth 4 (four) times daily -  with meals and at bedtime.    Dispense:  56 tablet    Refill:  0    Order Specific Question:   Supervising Provider    Answer:   Pricilla Holm A [id number]     Follow-up: Return if symptoms worsen or fail to improve.  Mauricio Po, [name]

## 2016-04-24 NOTE — Assessment & Plan Note (Signed)
Shortness of breath with concern for gastroesophageal reflux that is status post malignancy. There is also concern for peptic ulcer disease. Continue current dosage of pantoprazole. Start sucralfate. Obtain H. Pylori. Recommend follow up with GI if symptoms do not improve.

## 2016-04-24 NOTE — Patient Instructions (Signed)
Thank you for choosing [facility].  SUMMARY AND INSTRUCTIONS:  Medication:  Your prescription(s) have been submitted to your pharmacy or been printed and provided for you. Please take as directed and contact our office if you believe you are having problem(s) with the medication(s) or have any questions.  Labs:  Please stop by the lab on the lower level of the building for your blood work. Your results will be released to [facility] (or called to you) after review, usually within 72 hours after test completion. If any changes need to be made, you will be notified at that same time.  1.) The lab is open from 7:30am to 5:30 pm Monday-Friday 2.) No appointment is necessary 3.) Fasting (if needed) is 6-8 hours after food and drink; black coffee and water are okay   Follow up:  If your symptoms worsen or fail to improve, please contact our office for further instruction, or in case of emergency go directly to the emergency room at the closest medical facility.    Food Choices for Gastroesophageal Reflux Disease, Adult When you have gastroesophageal reflux disease (GERD), the foods you eat and your eating habits are very important. Choosing the right foods can help ease the discomfort of GERD. WHAT GENERAL GUIDELINES DO I NEED TO FOLLOW?  Choose fruits, vegetables, whole grains, low-fat dairy products, and low-fat meat, fish, and poultry.  Limit fats such as oils, salad dressings, butter, nuts, and avocado.  Keep a food diary to identify foods that cause symptoms.  Avoid foods that cause reflux. These may be different for different people.  Eat frequent small meals instead of three large meals each day.  Eat your meals slowly, in a relaxed setting.  Limit fried foods.  Cook foods using methods other than frying.  Avoid drinking alcohol.  Avoid drinking large amounts of liquids with your meals.  Avoid bending over or lying down until 2-3 hours after eating. WHAT FOODS ARE  NOT RECOMMENDED? The following are some foods and drinks that may worsen your symptoms: Vegetables Tomatoes. Tomato juice. Tomato and spaghetti sauce. Chili peppers. Onion and garlic. Horseradish. Fruits Oranges, grapefruit, and lemon (fruit and juice). Meats High-fat meats, fish, and poultry. This includes hot dogs, ribs, ham, sausage, salami, and bacon. Dairy Whole milk and chocolate milk. Sour cream. Cream. Butter. Ice cream. Cream cheese.  Beverages Coffee and tea, with or without caffeine. Carbonated beverages or energy drinks. Condiments Hot sauce. Barbecue sauce.  Sweets/Desserts Chocolate and cocoa. Donuts. Peppermint and spearmint. Fats and Oils High-fat foods, including [place] fries and potato chips. Other Vinegar. Strong spices, such as black pepper, white pepper, red pepper, cayenne, curry powder, cloves, ginger, and chili powder. The items listed above may not be a complete list of foods and beverages to avoid. Contact your dietitian for more information.   This information is not intended to replace advice given to you by your health care provider. Make sure you discuss any questions you have with your health care provider.   Document Released: 06/19/2005 Document Revised: 07/10/2014 Document Reviewed: 04/23/2013 Elsevier Interactive Patient Education [employer].

## 2016-04-25 ENCOUNTER — Encounter: Payer: Self-pay | Admitting: Family

## 2016-04-25 MED ORDER — CLARITHROMYCIN 500 MG PO TABS: 500.0000 mg | ORAL_TABLET | Freq: Two times a day (BID) | ORAL | 0 refills | Status: DC

## 2016-04-25 MED ORDER — AMOXICILLIN 500 MG PO TABS: 1000.0000 mg | ORAL_TABLET | Freq: Two times a day (BID) | ORAL | 0 refills | Status: DC

## 2016-05-03 ENCOUNTER — Other Ambulatory Visit: Payer: Self-pay | Admitting: Family

## 2016-05-10 ENCOUNTER — Other Ambulatory Visit (HOSPITAL_COMMUNITY): Payer: Medicare Other

## 2016-05-12 ENCOUNTER — Ambulatory Visit (INDEPENDENT_AMBULATORY_CARE_PROVIDER_SITE_OTHER): Payer: Medicare Other | Admitting: General Practice

## 2016-05-12 DIAGNOSIS — Z5181 Encounter for therapeutic drug level monitoring: Secondary | ICD-10-CM

## 2016-05-12 DIAGNOSIS — Z86711 Personal history of pulmonary embolism: Secondary | ICD-10-CM | POA: Diagnosis not present

## 2016-05-12 LAB — POCT INR: INR: 2.7

## 2016-05-12 NOTE — Patient Instructions (Signed)
Pre visit review using our clinic review tool, if applicable. No additional management support is needed unless otherwise documented below in the visit note. 

## 2016-05-12 NOTE — Progress Notes (Signed)
I have reviewed and agree with the plan. 

## 2016-05-20 ENCOUNTER — Encounter: Payer: Self-pay | Admitting: Family

## 2016-05-22 ENCOUNTER — Encounter: Payer: Self-pay | Admitting: Family

## 2016-05-27 ENCOUNTER — Encounter (HOSPITAL_COMMUNITY): Payer: Self-pay | Admitting: Emergency Medicine

## 2016-05-27 ENCOUNTER — Inpatient Hospital Stay (HOSPITAL_COMMUNITY)
Admission: EM | Admit: 2016-05-27 | Discharge: 2016-05-30 | DRG: 812 | Disposition: A | Discharge status: Home / Self Care | Payer: Medicare Other | Attending: Family Medicine | Admitting: Family Medicine

## 2016-05-27 ENCOUNTER — Observation Stay (HOSPITAL_COMMUNITY): Payer: Medicare Other

## 2016-05-27 ENCOUNTER — Emergency Department (HOSPITAL_COMMUNITY): Payer: Medicare Other

## 2016-05-27 DIAGNOSIS — I350 Nonrheumatic aortic (valve) stenosis: Secondary | ICD-10-CM | POA: Diagnosis not present

## 2016-05-27 DIAGNOSIS — I5032 Chronic diastolic (congestive) heart failure: Secondary | ICD-10-CM | POA: Diagnosis not present

## 2016-05-27 DIAGNOSIS — D649 Anemia, unspecified: Secondary | ICD-10-CM | POA: Diagnosis present

## 2016-05-27 DIAGNOSIS — G8929 Other chronic pain: Secondary | ICD-10-CM | POA: Diagnosis present

## 2016-05-27 DIAGNOSIS — Z66 Do not resuscitate: Secondary | ICD-10-CM | POA: Diagnosis not present

## 2016-05-27 DIAGNOSIS — R011 Cardiac murmur, unspecified: Secondary | ICD-10-CM | POA: Diagnosis present

## 2016-05-27 DIAGNOSIS — Z8041 Family history of malignant neoplasm of ovary: Secondary | ICD-10-CM

## 2016-05-27 DIAGNOSIS — Z7952 Long term (current) use of systemic steroids: Secondary | ICD-10-CM

## 2016-05-27 DIAGNOSIS — Z85028 Personal history of other malignant neoplasm of stomach: Secondary | ICD-10-CM

## 2016-05-27 DIAGNOSIS — Z91011 Allergy to milk products: Secondary | ICD-10-CM

## 2016-05-27 DIAGNOSIS — Z8673 Personal history of transient ischemic attack (TIA), and cerebral infarction without residual deficits: Secondary | ICD-10-CM

## 2016-05-27 DIAGNOSIS — Z9102 Food additives allergy status: Secondary | ICD-10-CM

## 2016-05-27 DIAGNOSIS — R2689 Other abnormalities of gait and mobility: Secondary | ICD-10-CM

## 2016-05-27 DIAGNOSIS — K219 Gastro-esophageal reflux disease without esophagitis: Secondary | ICD-10-CM | POA: Diagnosis present

## 2016-05-27 DIAGNOSIS — Z8249 Family history of ischemic heart disease and other diseases of the circulatory system: Secondary | ICD-10-CM

## 2016-05-27 DIAGNOSIS — Z823 Family history of stroke: Secondary | ICD-10-CM

## 2016-05-27 DIAGNOSIS — R0602 Shortness of breath: Secondary | ICD-10-CM | POA: Diagnosis not present

## 2016-05-27 DIAGNOSIS — I480 Paroxysmal atrial fibrillation: Secondary | ICD-10-CM | POA: Diagnosis present

## 2016-05-27 DIAGNOSIS — C160 Malignant neoplasm of cardia: Secondary | ICD-10-CM | POA: Diagnosis present

## 2016-05-27 DIAGNOSIS — R079 Chest pain, unspecified: Secondary | ICD-10-CM | POA: Diagnosis not present

## 2016-05-27 DIAGNOSIS — M549 Dorsalgia, unspecified: Secondary | ICD-10-CM | POA: Diagnosis present

## 2016-05-27 DIAGNOSIS — K449 Diaphragmatic hernia without obstruction or gangrene: Secondary | ICD-10-CM | POA: Diagnosis present

## 2016-05-27 DIAGNOSIS — I10 Essential (primary) hypertension: Secondary | ICD-10-CM

## 2016-05-27 DIAGNOSIS — Z91041 Radiographic dye allergy status: Secondary | ICD-10-CM

## 2016-05-27 DIAGNOSIS — Z79891 Long term (current) use of opiate analgesic: Secondary | ICD-10-CM

## 2016-05-27 DIAGNOSIS — M6281 Muscle weakness (generalized): Secondary | ICD-10-CM

## 2016-05-27 DIAGNOSIS — Z5181 Encounter for therapeutic drug level monitoring: Secondary | ICD-10-CM | POA: Diagnosis not present

## 2016-05-27 DIAGNOSIS — Z7901 Long term (current) use of anticoagulants: Secondary | ICD-10-CM

## 2016-05-27 DIAGNOSIS — Z792 Long term (current) use of antibiotics: Secondary | ICD-10-CM

## 2016-05-27 DIAGNOSIS — R0600 Dyspnea, unspecified: Secondary | ICD-10-CM

## 2016-05-27 DIAGNOSIS — D509 Iron deficiency anemia, unspecified: Secondary | ICD-10-CM | POA: Diagnosis not present

## 2016-05-27 DIAGNOSIS — Z888 Allergy status to other drugs, medicaments and biological substances status: Secondary | ICD-10-CM

## 2016-05-27 DIAGNOSIS — I110 Hypertensive heart disease with heart failure: Secondary | ICD-10-CM | POA: Diagnosis present

## 2016-05-27 DIAGNOSIS — R1310 Dysphagia, unspecified: Secondary | ICD-10-CM | POA: Diagnosis present

## 2016-05-27 DIAGNOSIS — Z86711 Personal history of pulmonary embolism: Secondary | ICD-10-CM | POA: Diagnosis present

## 2016-05-27 LAB — BASIC METABOLIC PANEL
ANION GAP: 7 (ref 5–15)
BUN: 12 mg/dL (ref 6–20)
CO2: 23 mmol/L (ref 22–32)
Calcium: 8.1 mg/dL — ABNORMAL LOW (ref 8.9–10.3)
Chloride: 105 mmol/L (ref 101–111)
Creatinine, Ser: 1.01 mg/dL — ABNORMAL HIGH (ref 0.44–1.00)
GFR calc Af Amer: >60 mL/min (ref >60)
GFR calc non Af Amer: 53 mL/min — ABNORMAL LOW (ref >60)
GLUCOSE: 165 mg/dL — AB (ref 65–99)
POTASSIUM: 4.2 mmol/L (ref 3.5–5.1)
Sodium: 135 mmol/L (ref 135–145)

## 2016-05-27 LAB — HEPATIC FUNCTION PANEL
ALT: 10 U/L — AB (ref 14–54)
AST: 17 U/L (ref 15–41)
Albumin: 3.2 g/dL — ABNORMAL LOW (ref 3.5–5.0)
Alkaline Phosphatase: 68 U/L (ref 38–126)
BILIRUBIN TOTAL: 0.4 mg/dL (ref 0.3–1.2)
Total Protein: 5.8 g/dL — ABNORMAL LOW (ref 6.5–8.1)

## 2016-05-27 LAB — CBC
HEMATOCRIT: 21.7 % — AB (ref 36.0–46.0)
HEMOGLOBIN: 6.5 g/dL — AB (ref 12.0–15.0)
MCH: 23.1 pg — AB (ref 26.0–34.0)
MCHC: 30 g/dL (ref 30.0–36.0)
MCV: 77.2 fL — AB (ref 78.0–100.0)
Platelets: 183 10*3/uL (ref 150–400)
RBC: 2.81 MIL/uL — ABNORMAL LOW (ref 3.87–5.11)
RDW: 14.7 % (ref 11.5–15.5)
WBC: 4.5 10*3/uL (ref 4.0–10.5)

## 2016-05-27 LAB — TROPONIN I: Troponin I: <0.03 ng/mL (ref <0.03)

## 2016-05-27 LAB — PROTIME-INR
INR: 2.54
Prothrombin Time: 27.8 seconds — ABNORMAL HIGH (ref 11.4–15.2)

## 2016-05-27 LAB — PREPARE RBC (CROSSMATCH)

## 2016-05-27 LAB — LACTATE DEHYDROGENASE: LDH: 143 U/L (ref 98–192)

## 2016-05-27 LAB — ABO/RH: ABO/RH(D): A POS

## 2016-05-27 MED ORDER — DIPHENHYDRAMINE HCL 25 MG PO CAPS
50.0000 mg | ORAL_CAPSULE | Freq: Once | ORAL | Status: AC
  Administered 2016-05-27: 50 mg via ORAL
  Filled 2016-05-27: qty 2

## 2016-05-27 MED ORDER — AMLODIPINE BESYLATE 5 MG PO TABS
10.0000 mg | ORAL_TABLET | Freq: Every day | ORAL | Status: DC
  Administered 2016-05-28 – 2016-05-30 (×3): 10 mg via ORAL
  Filled 2016-05-27 (×3): qty 2

## 2016-05-27 MED ORDER — GABAPENTIN 100 MG PO CAPS
100.0000 mg | ORAL_CAPSULE | Freq: Three times a day (TID) | ORAL | Status: DC
  Administered 2016-05-27 – 2016-05-30 (×8): 100 mg via ORAL
  Filled 2016-05-27 (×8): qty 1

## 2016-05-27 MED ORDER — ACETAMINOPHEN 325 MG PO TABS
650.0000 mg | ORAL_TABLET | Freq: Four times a day (QID) | ORAL | Status: DC | PRN
  Administered 2016-05-27: 650 mg via ORAL
  Filled 2016-05-27: qty 2

## 2016-05-27 MED ORDER — ACETAMINOPHEN 650 MG RE SUPP: 650.0000 mg | Freq: Four times a day (QID) | RECTAL | Status: DC | PRN

## 2016-05-27 MED ORDER — ZOLPIDEM TARTRATE 5 MG PO TABS
5.0000 mg | ORAL_TABLET | Freq: Every day | ORAL | Status: DC
  Administered 2016-05-27: 5 mg via ORAL
  Filled 2016-05-27: qty 1

## 2016-05-27 MED ORDER — TEMAZEPAM 15 MG PO CAPS: 15.0000 mg | ORAL_CAPSULE | Freq: Every day | ORAL | Status: DC

## 2016-05-27 MED ORDER — SODIUM CHLORIDE 0.9 % IV SOLN
Freq: Once | INTRAVENOUS | Status: AC
  Administered 2016-05-27: 17:00:00 via INTRAVENOUS

## 2016-05-27 MED ORDER — TRAMADOL HCL 50 MG PO TABS
50.0000 mg | ORAL_TABLET | Freq: Three times a day (TID) | ORAL | Status: DC
  Administered 2016-05-27 – 2016-05-30 (×8): 50 mg via ORAL
  Filled 2016-05-27 (×9): qty 1

## 2016-05-27 MED ORDER — ONDANSETRON HCL 4 MG PO TABS: 4.0000 mg | ORAL_TABLET | Freq: Four times a day (QID) | ORAL | Status: DC | PRN

## 2016-05-27 MED ORDER — ONDANSETRON HCL 4 MG/2ML IJ SOLN: 4.0000 mg | Freq: Four times a day (QID) | INTRAMUSCULAR | Status: DC | PRN

## 2016-05-27 MED ORDER — PREDNISONE 50 MG PO TABS
50.0000 mg | ORAL_TABLET | Freq: Four times a day (QID) | ORAL | Status: DC
  Administered 2016-05-27 – 2016-05-28 (×2): 50 mg via ORAL
  Filled 2016-05-27 (×3): qty 1

## 2016-05-27 MED ORDER — METOPROLOL TARTRATE 50 MG PO TABS
50.0000 mg | ORAL_TABLET | Freq: Two times a day (BID) | ORAL | Status: DC
  Administered 2016-05-27 – 2016-05-30 (×6): 50 mg via ORAL
  Filled 2016-05-27 (×6): qty 1

## 2016-05-27 MED ORDER — IRBESARTAN 300 MG PO TABS
300.0000 mg | ORAL_TABLET | Freq: Every day | ORAL | Status: DC
  Administered 2016-05-28 – 2016-05-30 (×3): 300 mg via ORAL
  Filled 2016-05-27 (×3): qty 1

## 2016-05-27 MED ORDER — SIMVASTATIN 20 MG PO TABS
20.0000 mg | ORAL_TABLET | Freq: Every day | ORAL | Status: DC
  Administered 2016-05-27 – 2016-05-29 (×3): 20 mg via ORAL
  Filled 2016-05-27 (×3): qty 1

## 2016-05-27 MED ORDER — PANTOPRAZOLE SODIUM 40 MG PO TBEC
40.0000 mg | DELAYED_RELEASE_TABLET | Freq: Two times a day (BID) | ORAL | Status: DC
  Administered 2016-05-27 – 2016-05-30 (×6): 40 mg via ORAL
  Filled 2016-05-27 (×6): qty 1

## 2016-05-27 NOTE — ED Provider Notes (Signed)
[place] DEPT Provider Note   CSN: [id number] Arrival date & time: 05/27/16  1428     History   Chief Complaint Chief Complaint  Patient presents with  . Shortness of Breath    HPI [name] is a 75 y.o. female.  Pt presents to the ED with sob that has been going on for about 1 week.  The pt has a cough and labored breathing.  Pt has not had a fever.  She has a hx of anemia and has had a full evaluation by GI and the etiology has not been determined.  Pt is also on coumadin as she has a hx of a.fib and PE.  Mali vascs score: 7.      Past Medical History:  Diagnosis Date  . Allergy   . Anemia 2005   generally microcytic, transfusions in [address], 2012, 02/2015, 05/2015  . Arthritis   . Atrial fibrillation ([place])   . Broken back 2013   chronic back pain.   . CHF (congestive heart failure) ([place])   . Esophageal cancer ([place]) 05/06/15   adenocarcinoma ge junction  . GERD (gastroesophageal reflux disease)   . H/O iron deficiency    05-06-15 iron infusion ([facility])  . HH (hiatus hernia) 2008   large with associated erosions.   . Hypertension   . PONV (postoperative nausea and vomiting)   . Pulmonary emboli ([place]) 2008, 2012  . Stroke ([place])   . Transfusion history    2 units transfused 05-06-15([facility])    Patient Active Problem List   Diagnosis Date Noted  . Essential hypertension, benign 04/19/2016  . Mild aortic stenosis 09/28/2015  . Chest pain at rest 09/05/2015  . 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 [place] lesion, acute   . IDA (iron deficiency anemia)   . Occult GI bleeding   . Nausea vomiting and diarrhea 05/05/2015  . Anemia 05/05/2015  . HLD (hyperlipidemia) 05/05/2015  . Abdominal pain 05/05/2015  . GERD (gastroesophageal reflux disease) 05/05/2015  . Absolute anemia   . Anticoagulated on Coumadin   . Osteoarthritis 04/16/2015  . History of pulmonary embolism 03/02/2015  . Shortness of breath 02/22/2015  . Benign neoplasm of descending colon  02/17/2015  . Diverticulosis of large intestine without diverticulitis 02/17/2015  . Esophageal stricture 02/16/2015  . UGI bleed 02/13/2015  . Supratherapeutic INR 02/13/2015  . Encounter for therapeutic drug monitoring 10/15/2014  . Thoracic back pain 10/15/2014  . Macular degeneration 09/01/2014  . Pneumonia 08/27/2014  . Benign paroxysmal positional vertigo   . Hypertension 08/24/2014  . Back pain 08/03/2014  . Complaints of total body pain 08/03/2014  . Sleep disturbance 08/03/2014  . Aortic stenosis 03/05/2014  . PAF (paroxysmal atrial fibrillation) ([place]) 01/21/2014  . Long term current use of anticoagulant therapy 01/21/2014  . Chronic diastolic (congestive) heart failure 01/21/2014  . Chest pain 01/21/2014    Past Surgical History:  Procedure Laterality Date  . APPENDECTOMY  1950s  . CARPAL TUNNEL RELEASE Bilateral 1990s  . [place] SURGERY  2014  . CHOLECYSTECTOMY  1980s   open  . COLONOSCOPY N/A 02/17/2015   Procedure: COLONOSCOPY;  Surgeon: [name], MD;  Location: [facility] ENDOSCOPY;  Service: Endoscopy;  Laterality: N/A;  . ESOPHAGOGASTRODUODENOSCOPY N/A 02/16/2015   Procedure: ESOPHAGOGASTRODUODENOSCOPY (EGD);  Surgeon: [name], MD;  Location: [facility] ENDOSCOPY;  Service: Endoscopy;  Laterality: N/A;  . ESOPHAGOGASTRODUODENOSCOPY N/A 05/06/2015   Procedure: ESOPHAGOGASTRODUODENOSCOPY (EGD);  Surgeon: [name], MD;  Location: [facility] ENDOSCOPY;  Service: Endoscopy;  Laterality: N/A;  . EUS N/A 05/20/2015   Procedure: UPPER ENDOSCOPIC ULTRASOUND (EUS) LINEAR;  Surgeon: [name], MD;  Location: [facility] ENDOSCOPY;  Service: Endoscopy;  Laterality: N/A;  . exploratory lab  1950s or 1960s  . GIVENS CAPSULE STUDY N/A 05/06/2015   Procedure: GIVENS CAPSULE STUDY;  Surgeon: [name], MD;  Location: [facility] ENDOSCOPY;  Service: Endoscopy;  Laterality: N/A;  . lumbar back surgery  2012  . [place] SURGERY  1991  . TONSILLECTOMY AND ADENOIDECTOMY  1960s    OB  History    No data available       Home Medications    Prior to Admission medications   Medication Sig Start Date End Date Taking? Authorizing Provider  acetaminophen (TYLENOL) 500 MG tablet Take 1,000 mg by mouth at bedtime as needed for moderate pain.    Historical Provider, MD  amLODipine (NORVASC) 10 MG tablet TAKE ONE TABLET BY MOUTH ONCE DAILY. 12/03/15   Golden Circle, [name]  amoxicillin (AMOXIL) 500 MG tablet Take 2 tablets (1,000 mg total) by mouth 2 (two) times daily. 04/25/16   Golden Circle, [name]  clarithromycin (BIAXIN) 500 MG tablet Take 1 tablet (500 mg total) by mouth 2 (two) times daily. 04/25/16   Golden Circle, [name]  cloNIDine (CATAPRES) 0.1 MG tablet Take 1 tablet (0.1 mg total) by mouth daily as needed (high blood pressure, will take if levels are 180/90). Patient taking differently: Take 0.1 mg by mouth daily as needed (high blood pressure, take if blood pressure is >179/99.).  05/08/15   Geradine Girt, DO  furosemide (LASIX) 40 MG tablet TAKE 1 TO 2 TABLETS BY MOUTH ONCE DAILY. 04/19/16   Pixie Casino, MD  gabapentin (NEURONTIN) 100 MG capsule TAKE 1 CAPSULE BY MOUTH THREE TIMES A DAY. 02/22/16   Golden Circle, [name]  meclizine (ANTIVERT) 25 MG tablet Take 1 tablet (25 mg total) by mouth 3 (three) times daily as needed for dizziness. 08/28/14   Bonnielee Haff, MD  metoprolol (LOPRESSOR) 50 MG tablet TAKE 1 TABLET BY MOUTH TWICE DAILY. 12/03/15   Golden Circle, [name]  NONFORMULARY OR COMPOUNDED ITEM Diclofenac/Baclofen/Cyclobenzaprine/Gabapentin/Lidocaine 3/2/2/6/2.5% Cream  Apply to affected area 2-3 times per day as needed for arthritic pain. Patient taking differently: Apply 1 application topically daily as needed (for arthritis). Diclofenac/Baclofen/Cyclobenzaprine/Gabapentin/Lidocaine 3/2/2/6/2.5% Cream  Apply to affected area 2-3 times per day as needed for arthritic pain. 04/16/15   Golden Circle, [name]  ondansetron (ZOFRAN) 4 MG tablet Take 4 mg by mouth  every 8 (eight) hours as needed for nausea or vomiting.    Historical Provider, MD  pantoprazole (PROTONIX) 40 MG tablet TAKE 1 TABLET BY MOUTH TWICE DAILY. 05/03/16   Golden Circle, [name]  predniSONE (DELTASONE) 20 MG tablet Take 3 tablets by mouth daily for 4 days, then 2 tablets by mouth daily for 4 days; then 1 tablet daily by mouth for 4 days. 03/01/16   Golden Circle, [name]  simvastatin (ZOCOR) 20 MG tablet TAKE (1) TABLET BY MOUTH AT BEDTIME. 05/03/16   Golden Circle, [name]  sucralfate (CARAFATE) 1 g tablet Take 1 tablet (1 g total) by mouth 4 (four) times daily -  with meals and at bedtime. 04/24/16   Golden Circle, [name]  temazepam (RESTORIL) 15 MG capsule TAKE 1 CAPSULE BY MOUTH AT BEDTIME. 03/31/16   Golden Circle, [name]  traMADol (ULTRAM) 50 MG tablet Take 1 tablet (50 mg total)  by mouth every 8 (eight) hours. 01/24/16   Golden Circle, [name]  Turmeric 450 MG CAPS Take 450 mg by mouth 4 (four) times daily.    Historical Provider, MD  valsartan (DIOVAN) 320 MG tablet TAKE 1/2 TABLET BY MOUTH ONCE DAILY. 12/16/15   Golden Circle, [name]  vitamin B-12 1000 MCG tablet Take 1 tablet (1,000 mcg total) by mouth daily. 05/08/15   Geradine Girt, DO  warfarin (COUMADIN) 5 MG tablet TAKE 1/2 TABLET BY MOUTH ON WEDNESDAY AND 1 TABLET ALL OTHER DAYS. 04/17/16   Golden Circle, [name]    Family History Family History  Problem Relation Age of Onset  . Stroke Mother   . Heart disease Mother   . Emphysema Father   . Ovarian cancer Sister   . Stroke Sister   . Other Child     died at birth    Social History Social History  Substance Use Topics  . Smoking status: Never Smoker  . Smokeless tobacco: Never Used  . Alcohol use No     Allergies   Iodinated diagnostic agents; Ioxaglate; Milk-related compounds; Red dye; Whey; Darvon [propoxyphene]; and Hydralazine   Review of Systems Review of Systems  Respiratory: Positive for shortness of breath.   All other systems reviewed and are  negative.    Physical Exam Updated Vital Signs BP (!) 137/39   Pulse 71   Temp 98.6 F (37 C) (Oral)   Resp 20   Ht 5\' 2"  (1.575 m)   Wt 135 lb (61.2 kg)   SpO2 97%   BMI 24.69 kg/m   Physical Exam  Constitutional: She is oriented to person, place, and time. She appears well-developed and well-nourished.  HENT:  Head: Normocephalic and atraumatic.  Right Ear: External ear normal.  Left Ear: External ear normal.  Nose: Nose normal.  Mouth/Throat: Oropharynx is clear and moist.  Eyes: Conjunctivae and EOM are normal. Pupils are equal, round, and reactive to light.  Neck: Normal range of motion. Neck supple.  Cardiovascular: Normal rate, regular rhythm, normal heart sounds and intact distal pulses.   Pulmonary/Chest: Effort normal and breath sounds normal.  Abdominal: Soft. Bowel sounds are normal.  Genitourinary: Rectal exam shows guaiac negative stool.  Musculoskeletal: Normal range of motion.  Neurological: She is alert and oriented to person, place, and time.  Skin: Skin is warm and dry.  Psychiatric: She has a normal mood and affect. Her behavior is normal. Judgment and thought content normal.  Nursing note and vitals reviewed.    ED Treatments / Results  Labs (all labs ordered are listed, but only abnormal results are displayed) Labs Reviewed  BASIC METABOLIC PANEL - Abnormal; Notable for the following:       Result Value   Glucose, Bld 165 (*)    Creatinine, Ser 1.01 (*)    Calcium 8.1 (*)    GFR calc non Af Amer 53 (*)    All other components within normal limits  CBC - Abnormal; Notable for the following:    RBC 2.81 (*)    Hemoglobin 6.5 (*)    HCT 21.7 (*)    MCV 77.2 (*)    MCH 23.1 (*)    All other components within normal limits  PROTIME-INR - Abnormal; Notable for the following:    Prothrombin Time 27.8 (*)    All other components within normal limits  TROPONIN I  POC OCCULT BLOOD, ED  TYPE AND SCREEN  PREPARE RBC (CROSSMATCH)  ABO/RH  EKG  EKG Interpretation  Date/Time:  Saturday May 27 2016 14:31:48 EST Ventricular Rate:  72 PR Interval:  170 QRS Duration: 66 QT Interval:  382 QTC Calculation: 418 R Axis:   53 Text Interpretation:  Normal sinus rhythm Normal ECG Confirmed by Kwan Shellhammer MD, Jarrin Staley ([id number]) on 05/27/2016 2:53:42 PM       Radiology Dg Chest 2 View  Result Date: 05/27/2016 CLINICAL DATA:  75 year old female with a history of chest pain and shortness of breath EXAM: CHEST  2 VIEW COMPARISON:  04/19/2016, 09/05/2015, CT chest 05/07/2015 FINDINGS: Cardiomediastinal silhouette unchanged. Double density over the lower mediastinum is unchanged. Calcifications of the aortic arch. Blunting of the sulcus on the lateral view is unchanged. No pneumothorax.  No confluent airspace disease. Surgical changes of the cervical region. No displaced fracture. Degenerative changes of the thoracic spine. IMPRESSION: Chronic lung changes without evidence of acute cardiopulmonary disease. Re- demonstration of large hiatal hernia. Aortic atherosclerosis. Signed, Dulcy Fanny. Earleen Newport, DO [facility] Radiology Electronically Signed   By: Corrie Mckusick D.O.   On: 05/27/2016 15:17    Procedures Procedures (including critical care time)  Medications Ordered in ED Medications  0.9 %  sodium chloride infusion ( Intravenous New Bag/Given 05/27/16 1700)     Initial Impression / Assessment and Plan / ED Course  I have reviewed the triage vital signs and the nursing notes.  Pertinent labs & imaging results that were available during my care of the patient were reviewed by me and considered in my medical decision making (see chart for details).  Clinical Course    I ordered 2 units of blood to be transfused into patient.  Pt d/w Dr. Nehemiah Settle for admission.  Final Clinical Impressions(s) / ED Diagnoses   Final diagnoses:  Symptomatic anemia    New Prescriptions New  Prescriptions   No medications on file     Isla Pence, MD 05/28/16 1331

## 2016-05-27 NOTE — H&P (Addendum)
History and Physical  NEISHA [name] [id number] DOB: March 02, 1941 DOA: 05/27/2016  Referring physician: Dr Gilford Raid, ED physician PCP: Mauricio Po, [place]  Outpatient Specialists:   Dr Debara Pickett (Cardiology)  Dr. Stephanie Acre (GI)  Chief Complaint: SOB  HPI: [name] is a 75 y.o. 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 Patient seen for 7-10 days of worsening shortness of breath. Dyspnea worse with exertion and improved with rest. Also worse with conversation. Found be severely anemic here. Patient has had severe anemia in the past requiring blood transfusions. She denies abdominal pain  Emergency Department Course: Chest x-ray was normal. CBC showed hemoglobin of 6.5. Hemoccult was negative.  Review of Systems:   Pt denies any fevers, chills, nausea, vomiting, diarrhea, constipation, abdominal pain, orthopnea, cough, wheezing, palpitations, headache, vision changes, lightheadedness, dizziness, melena, rectal bleeding.  Review of systems are otherwise negative  Past Medical History:  Diagnosis Date  . Allergy   . Anemia 2005   generally microcytic, transfusions in [address], 2012, 02/2015, 05/2015  . Arthritis   . Atrial fibrillation ([place])   . Broken back 2013   chronic back pain.   . CHF (congestive heart failure) ([place])   . Esophageal cancer ([place]) 05/06/15   adenocarcinoma ge junction  . GERD (gastroesophageal reflux disease)   . H/O iron deficiency    05-06-15 iron infusion ([facility])  . HH (hiatus hernia) 2008   large with associated erosions.   . Hypertension   . PONV (postoperative nausea and vomiting)   . Pulmonary emboli ([place]) 2008, 2012  . Stroke ([place])   . Transfusion history    2 units transfused 05-06-15([facility])   Past Surgical History:  Procedure Laterality Date  . APPENDECTOMY  1950s  . CARPAL TUNNEL RELEASE  Bilateral 1990s  . [place] SURGERY  2014  . CHOLECYSTECTOMY  1980s   open  . COLONOSCOPY N/A 02/17/2015   Procedure: COLONOSCOPY;  Surgeon: [name], MD;  Location: [facility] ENDOSCOPY;  Service: Endoscopy;  Laterality: N/A;  . ESOPHAGOGASTRODUODENOSCOPY N/A 02/16/2015   Procedure: ESOPHAGOGASTRODUODENOSCOPY (EGD);  Surgeon: [name], MD;  Location: [facility];  Service: Endoscopy;  Laterality: N/A;  . ESOPHAGOGASTRODUODENOSCOPY N/A 05/06/2015   Procedure: ESOPHAGOGASTRODUODENOSCOPY (EGD);  Surgeon: [name], MD;  Location: [facility]COPY;  Service: Endoscopy;  Laterality: N/A;  . EUS N/A 05/20/2015   Procedure: UPPER ENDOSCOPIC ULTRASOUND (EUS) LINEAR;  Surgeon: [name], MD;  Location: [facility] ENDOSCOPY;  Service: Endoscopy;  Laterality: N/A;  . exploratory lab  1950s or 1960s  . GIVENS CAPSULE STUDY N/A 05/06/2015   Procedure: GIVENS CAPSULE STUDY;  Surgeon: [name], MD;  Location[facility] ENDOSCOPY;  Service: Endoscopy;  Laterality: N/A;  . lumbar back surgery  2012  . [place] SURGERY  1991  . TONSILLECTOMY AND ADENOIDECTOMY  1960s   Social History:  reports that she has never smoked. She has never used smokeless tobacco. She reports that she does not drink alcohol or use drugs. Patient lives at [place]  . Iodinated Diagnostic Agents Anaphylaxis    IPD dye Info given by patient  . Ioxaglate Anaphylaxis    Info given by patient  . Milk-Related Compounds Anaphylaxis    Lactose intolerance  . Red Dye Anaphylaxis  . Whey     Lactose intolerance  . Darvon [Propoxyphene] Rash  .  Hydralazine Anxiety    Facial flushing, pt prefers not to use it.     Family History  Problem Relation Age of Onset  . Stroke Mother   . Heart disease Mother   . Emphysema Father   . Ovarian cancer Sister   . Stroke Sister   . Other Child     died at birth     Prior to Admission medications   Medication Sig Start Date End Date Taking? Authorizing  Provider  acetaminophen (TYLENOL) 500 MG tablet Take 500-1,000 mg by mouth daily as needed for mild pain or moderate pain.    Yes Historical Provider, MD  amLODipine (NORVASC) 10 MG tablet TAKE ONE TABLET BY MOUTH ONCE DAILY. 12/03/15  Yes Golden Circle, [name]  cloNIDine (CATAPRES) 0.1 MG tablet Take 1 tablet (0.1 mg total) by mouth daily as needed (high blood pressure, will take if levels are 180/90). Patient taking differently: Take 0.1 mg by mouth daily as needed (high blood pressure, take if blood pressure is >179/99.).  05/08/15  Yes Jessica U Vann, DO  furosemide (LASIX) 40 MG tablet TAKE 1 TO 2 TABLETS BY MOUTH ONCE DAILY. Patient taking differently: Take 20-40 mg by mouth daily as needed for fluid.  04/19/16  Yes Pixie Casino, MD  gabapentin (NEURONTIN) 100 MG capsule TAKE 1 CAPSULE BY MOUTH THREE TIMES A DAY. 02/22/16  Yes Golden Circle, [name]  meclizine (ANTIVERT) 25 MG tablet Take 1 tablet (25 mg total) by mouth 3 (three) times daily as needed for dizziness. 08/28/14  Yes Bonnielee Haff, MD  metoprolol (LOPRESSOR) 50 MG tablet TAKE 1 TABLET BY MOUTH TWICE DAILY. 12/03/15  Yes Golden Circle, [name]  NONFORMULARY OR COMPOUNDED ITEM Diclofenac/Baclofen/Cyclobenzaprine/Gabapentin/Lidocaine 3/2/2/6/2.5% Cream  Apply to affected area 2-3 times per day as needed for arthritic pain. Patient taking differently: Apply 1 application topically daily as needed (for arthritis). Diclofenac/Baclofen/Cyclobenzaprine/Gabapentin/Lidocaine 3/2/2/6/2.5% Cream  Apply to affected area 2-3 times per day as needed for arthritic pain. 04/16/15  Yes Golden Circle, [name]  ondansetron (ZOFRAN) 4 MG tablet Take 4 mg by mouth every 8 (eight) hours as needed for nausea or vomiting.   Yes Historical Provider, MD  pantoprazole (PROTONIX) 40 MG tablet TAKE 1 TABLET BY MOUTH TWICE DAILY. 05/03/16  Yes Golden Circle, [name]  predniSONE (DELTASONE) 20 MG tablet Take 3 tablets by mouth daily for 4 days, then 2 tablets by mouth  daily for 4 days; then 1 tablet daily by mouth for 4 days. 03/01/16  Yes Golden Circle, [name]  simvastatin (ZOCOR) 20 MG tablet TAKE (1) TABLET BY MOUTH AT BEDTIME. 05/03/16  Yes Golden Circle, [name]  temazepam (RESTORIL) 15 MG capsule TAKE 1 CAPSULE BY MOUTH AT BEDTIME. 03/31/16  Yes Golden Circle, [name]  traMADol (ULTRAM) 50 MG tablet Take 1 tablet (50 mg total) by mouth every 8 (eight) hours. 01/24/16  Yes Golden Circle, [name]  valsartan (DIOVAN) 320 MG tablet TAKE 1/2 TABLET BY MOUTH ONCE DAILY. 12/16/15  Yes Golden Circle, [name]  warfarin (COUMADIN) 5 MG tablet TAKE 1/2 TABLET BY MOUTH ON WEDNESDAY AND 1 TABLET ALL OTHER DAYS. Patient taking differently: TAKE 1/2 TABLET BY MOUTH ON WEDNESDAY AND 1 TABLET ALL OTHER DAYS. TAKES IN THE EVENING 04/17/16  Yes Golden Circle, [name]  amoxicillin (AMOXIL) 500 MG tablet Take 2 tablets (1,000 mg total) by mouth 2 (two) times daily. Patient not taking: Reported on 05/27/2016 04/25/16   Golden Circle, [name]  clarithromycin (  BIAXIN) 500 MG tablet Take 1 tablet (500 mg total) by mouth 2 (two) times daily. Patient not taking: Reported on 05/27/2016 04/25/16   Golden Circle, [name]  sucralfate (CARAFATE) 1 g tablet Take 1 tablet (1 g total) by mouth 4 (four) times daily -  with meals and at bedtime. Patient not taking: Reported on 05/27/2016 04/24/16   Golden Circle, [name]    Physical Exam: BP (!) 137/39   Pulse 71   Temp 98.6 F (37 C) (Oral)   Resp 20   Ht 5\' 2"  (1.575 m)   Wt 61.2 kg (135 lb)   SpO2 97%   BMI 24.69 kg/m   General: Elderly Caucasian female. Awake and alert and oriented x3. No acute cardiopulmonary distress.  HEENT: Normocephalic atraumatic.  Right and left ears normal in appearance.  Pupils equal, round, reactive to light. Extraocular muscles are intact. Sclerae anicteric and noninjected.  Moist mucosal membranes. No mucosal lesions.  Neck: Neck supple without lymphadenopathy. No carotid bruits. No masses palpated.    Cardiovascular: Regular rate with normal S1-S2 sounds. No murmurs, rubs, gallops auscultated. No JVD.  Respiratory: Good respiratory effort with no wheezes, rales, rhonchi. Lungs clear to auscultation bilaterally.  No accessory muscle use. Abdomen: Soft, nontender, nondistended. Active bowel sounds. No masses or hepatosplenomegaly  Skin: No rashes, lesions, or ulcerations.  Dry, warm to touch. 2+ dorsalis pedis and radial pulses. Musculoskeletal: No calf or leg pain. All major joints not erythematous nontender.  No upper or lower joint deformation.  Good ROM.  No contractures  Psychiatric: Intact judgment and insight. Pleasant and cooperative. Neurologic: No focal neurological deficits. Strength is 5/5 and symmetric in upper and lower extremities.  Cranial nerves II through XII are grossly intact.           Labs on Admission: I have personally reviewed following labs and imaging studies  CBC:  Recent Labs Lab 05/27/16 1502  WBC 4.5  HGB 6.5*  HCT 21.7*  MCV 77.2*  PLT [id number]   Basic Metabolic Panel:  Recent Labs Lab 05/27/16 1502  NA 135  K 4.2  CL 105  CO2 23  GLUCOSE 165*  BUN 12  CREATININE 1.01*  CALCIUM 8.1*   GFR: Estimated Creatinine Clearance: 40.8 mL/min (by C-G formula based on SCr of 1.01 mg/dL (H)). Liver Function Tests: No results for input(s): AST, ALT, ALKPHOS, BILITOT, PROT, ALBUMIN in the last 168 hours. No results for input(s): LIPASE, AMYLASE in the last 168 hours. No results for input(s): AMMONIA in the last 168 hours. Coagulation Profile:  Recent Labs Lab 05/27/16 1502  INR 2.54   Cardiac Enzymes:  Recent Labs Lab 05/27/16 1502  TROPONINI <0.03   BNP (last 3 results) No results for input(s): PROBNP in the last 8760 hours. HbA1C: No results for input(s): HGBA1C in the last 72 hours. CBG: No results for input(s): GLUCAP in the last 168 hours. Lipid Profile: No results for input(s): CHOL, HDL, LDLCALC, TRIG, CHOLHDL, LDLDIRECT in the  last 72 hours. Thyroid Function Tests: No results for input(s): TSH, T4TOTAL, FREET4, T3FREE, THYROIDAB in the last 72 hours. Anemia Panel: No results for input(s): VITAMINB12, FOLATE, FERRITIN, TIBC, IRON, RETICCTPCT in the last 72 hours. Urine analysis:    Component Value Date/Time   COLORURINE YELLOW 05/04/2015 2308   APPEARANCEUR CLOUDY (A) 05/04/2015 2308   LABSPEC 1.017 05/04/2015 2308   PHURINE 5.0 05/04/2015 2308   GLUCOSEU NEGATIVE 05/04/2015 2308   HGBUR NEGATIVE 05/04/2015 2308   BILIRUBINUR NEGATIVE  05/04/2015 2308   KETONESUR 15 (A) 05/04/2015 2308   PROTEINUR NEGATIVE 05/04/2015 2308   UROBILINOGEN 0.2 05/04/2015 2308   NITRITE NEGATIVE 05/04/2015 2308   LEUKOCYTESUR NEGATIVE 05/04/2015 2308   Sepsis Labs: @LABRCNTIP (procalcitonin:4,lacticidven:4) )No results found for this or any previous visit (from the past 240 hour(s)).   Radiological Exams on Admission: Dg Chest 2 View  Result Date: 05/27/2016 CLINICAL DATA:  75 year old female with a history of chest pain and shortness of breath EXAM: CHEST  2 VIEW COMPARISON:  04/19/2016, 09/05/2015, CT chest 05/07/2015 FINDINGS: Cardiomediastinal silhouette unchanged. Double density over the lower mediastinum is unchanged. Calcifications of the aortic arch. Blunting of the sulcus on the lateral view is unchanged. No pneumothorax.  No confluent airspace disease. Surgical changes of the cervical region. No displaced fracture. Degenerative changes of the thoracic spine. IMPRESSION: Chronic lung changes without evidence of acute cardiopulmonary disease. Re- demonstration of large hiatal hernia. Aortic atherosclerosis. Signed, Dulcy Fanny. Earleen Newport, DO [facility] Electronically Signed   By: Corrie Mckusick D.O.   On: 05/27/2016 15:17    EKG: Independently reviewed. Normal sinus rhythm. No ST changes.  Assessment/Plan: Principal Problem:   Symptomatic anemia Active Problems:    PAF (paroxysmal atrial fibrillation) ([name])   Hypertension   History of pulmonary embolism   GERD (gastroesophageal reflux disease)   Anticoagulated on Coumadin   GE junction carcinoma ([place])    This patient was discussed with the ED physician, including pertinent vitals, physical exam findings, labs, and imaging.  We also discussed care given by the ED provider.  #1 symptomatic anemia  Observation  MCV suggests iron deficiency.  No evidence of GI bleed - however, will hold the coumadin for the moment.  No pancytopenia, so less concerned about myelodysplastic syndrome  We'll check differential, iron, ferritin, iron, 0000000, folic acid, and LDH #2 GERD  Continue protonix #3 anticoagulation on Coumadin  Hold. INR in the AM. #4 PAF  NSR currently. #5 GE junction carcinoma  S/p resection  Check CT to assure no reocurrance. #6 hypertension  Continue home meds.  DVT prophylaxis: SCDs, therapeutic INR with coumadin Consultants: none Code Status: DNR Family Communication: none  Disposition Plan: Home following observation   Truett Mainland, DO [facility] Pager [phone]  If 7PM-7AM, please contact night-coverage [email or web address] Password [email or web address]

## 2016-05-27 NOTE — ED Notes (Signed)
CRITICAL VALUE ALERT  Critical value received:  Hgb 6.5  Date of notification:  05/27/16  Time of notification:  [id number]  Critical value read back:Yes.    Nurse who received alert:  Domenica Reamer RN  MD notified (1st page):  Dr Gilford Raid  Time of first page:  1541  MD notified (2nd page):  Time of second page:  Responding MD:  Dr Gilford Raid  Time MD responded:  [phone]

## 2016-05-27 NOTE — ED Triage Notes (Addendum)
Pt reports sob for one week along with weakness.  Pt has non productive cough, sob in triage, labored breathing.   Pt alert and oriented.  Pt son reports she has anemia and cannot find source.  Pt has also had intermittent cp.

## 2016-05-28 ENCOUNTER — Observation Stay (HOSPITAL_COMMUNITY): Payer: Medicare Other

## 2016-05-28 DIAGNOSIS — Z7901 Long term (current) use of anticoagulants: Secondary | ICD-10-CM | POA: Diagnosis not present

## 2016-05-28 DIAGNOSIS — Z888 Allergy status to other drugs, medicaments and biological substances status: Secondary | ICD-10-CM | POA: Diagnosis not present

## 2016-05-28 DIAGNOSIS — D649 Anemia, unspecified: Secondary | ICD-10-CM | POA: Diagnosis present

## 2016-05-28 DIAGNOSIS — K449 Diaphragmatic hernia without obstruction or gangrene: Secondary | ICD-10-CM | POA: Diagnosis present

## 2016-05-28 DIAGNOSIS — R011 Cardiac murmur, unspecified: Secondary | ICD-10-CM | POA: Diagnosis present

## 2016-05-28 DIAGNOSIS — I5032 Chronic diastolic (congestive) heart failure: Secondary | ICD-10-CM | POA: Diagnosis present

## 2016-05-28 DIAGNOSIS — Z8249 Family history of ischemic heart disease and other diseases of the circulatory system: Secondary | ICD-10-CM | POA: Diagnosis not present

## 2016-05-28 DIAGNOSIS — Z8673 Personal history of transient ischemic attack (TIA), and cerebral infarction without residual deficits: Secondary | ICD-10-CM | POA: Diagnosis not present

## 2016-05-28 DIAGNOSIS — Z91011 Allergy to milk products: Secondary | ICD-10-CM | POA: Diagnosis not present

## 2016-05-28 DIAGNOSIS — G8929 Other chronic pain: Secondary | ICD-10-CM | POA: Diagnosis present

## 2016-05-28 DIAGNOSIS — Z5181 Encounter for therapeutic drug level monitoring: Secondary | ICD-10-CM | POA: Diagnosis not present

## 2016-05-28 DIAGNOSIS — R1310 Dysphagia, unspecified: Secondary | ICD-10-CM | POA: Diagnosis not present

## 2016-05-28 DIAGNOSIS — M549 Dorsalgia, unspecified: Secondary | ICD-10-CM | POA: Diagnosis present

## 2016-05-28 DIAGNOSIS — D509 Iron deficiency anemia, unspecified: Secondary | ICD-10-CM | POA: Diagnosis present

## 2016-05-28 DIAGNOSIS — Z8041 Family history of malignant neoplasm of ovary: Secondary | ICD-10-CM | POA: Diagnosis not present

## 2016-05-28 DIAGNOSIS — Z9102 Food additives allergy status: Secondary | ICD-10-CM | POA: Diagnosis not present

## 2016-05-28 DIAGNOSIS — Z91041 Radiographic dye allergy status: Secondary | ICD-10-CM | POA: Diagnosis not present

## 2016-05-28 DIAGNOSIS — Z79891 Long term (current) use of opiate analgesic: Secondary | ICD-10-CM | POA: Diagnosis not present

## 2016-05-28 DIAGNOSIS — C160 Malignant neoplasm of cardia: Secondary | ICD-10-CM | POA: Diagnosis not present

## 2016-05-28 DIAGNOSIS — I350 Nonrheumatic aortic (valve) stenosis: Secondary | ICD-10-CM

## 2016-05-28 DIAGNOSIS — R0609 Other forms of dyspnea: Secondary | ICD-10-CM | POA: Diagnosis not present

## 2016-05-28 DIAGNOSIS — I110 Hypertensive heart disease with heart failure: Secondary | ICD-10-CM | POA: Diagnosis present

## 2016-05-28 DIAGNOSIS — Z66 Do not resuscitate: Secondary | ICD-10-CM | POA: Diagnosis present

## 2016-05-28 DIAGNOSIS — R0602 Shortness of breath: Secondary | ICD-10-CM | POA: Diagnosis not present

## 2016-05-28 DIAGNOSIS — I480 Paroxysmal atrial fibrillation: Secondary | ICD-10-CM | POA: Diagnosis not present

## 2016-05-28 DIAGNOSIS — Z823 Family history of stroke: Secondary | ICD-10-CM | POA: Diagnosis not present

## 2016-05-28 DIAGNOSIS — Z86711 Personal history of pulmonary embolism: Secondary | ICD-10-CM | POA: Diagnosis not present

## 2016-05-28 DIAGNOSIS — Z85028 Personal history of other malignant neoplasm of stomach: Secondary | ICD-10-CM | POA: Diagnosis not present

## 2016-05-28 DIAGNOSIS — K219 Gastro-esophageal reflux disease without esophagitis: Secondary | ICD-10-CM | POA: Diagnosis present

## 2016-05-28 LAB — TYPE AND SCREEN
ABO/RH(D): A POS
ANTIBODY SCREEN: NEGATIVE
UNIT DIVISION: 0
UNIT DIVISION: 0

## 2016-05-28 LAB — IRON AND TIBC
IRON: 5 ug/dL — AB (ref 28–170)
Saturation Ratios: 1 % — ABNORMAL LOW (ref 10.4–31.8)
TIBC: 356 ug/dL (ref 250–450)
UIBC: 351 ug/dL

## 2016-05-28 LAB — CBC
HCT: 30.9 % — ABNORMAL LOW (ref 36.0–46.0)
Hemoglobin: 9.7 g/dL — ABNORMAL LOW (ref 12.0–15.0)
MCH: 24.6 pg — AB (ref 26.0–34.0)
MCHC: 31.4 g/dL (ref 30.0–36.0)
MCV: 78.4 fL (ref 78.0–100.0)
PLATELETS: 161 10*3/uL (ref 150–400)
RBC: 3.94 MIL/uL (ref 3.87–5.11)
RDW: 15.6 % — ABNORMAL HIGH (ref 11.5–15.5)
WBC: 3.7 10*3/uL — ABNORMAL LOW (ref 4.0–10.5)

## 2016-05-28 LAB — PROTIME-INR
INR: 2.37
PROTHROMBIN TIME: 26.3 s — AB (ref 11.4–15.2)

## 2016-05-28 LAB — VITAMIN B12: VITAMIN B 12: 185 pg/mL (ref 180–914)

## 2016-05-28 LAB — FOLATE: FOLATE: 16.9 ng/mL (ref >5.9)

## 2016-05-28 LAB — OCCULT BLOOD, POC DEVICE: Fecal Occult Bld: NEGATIVE

## 2016-05-28 MED ORDER — TEMAZEPAM 15 MG PO CAPS
15.0000 mg | ORAL_CAPSULE | Freq: Every evening | ORAL | Status: DC | PRN
  Administered 2016-05-28 – 2016-05-29 (×3): 15 mg via ORAL
  Filled 2016-05-28 (×3): qty 1

## 2016-05-28 NOTE — Consult Note (Signed)
Referring Provider: Murray Hodgkins, MD Primary Care Physician:  Mauricio Po, [name] Primary Gastroenterologist:  Dr.Jacobs.  Reason for Consultation:    Microcytic anemia.  HPI:   Patient is 75 old Caucasian female with multiple medical problems who also has history of atrial fibrillation and is chronically anticoagulated who presented to emergency room last evening with over a week history of progressive weakness and exertional dyspnea. In emergency room she was noted to have hemoglobin of 6.5 g. Her hemoglobin back on 09/06/2015 was 11.5. Patient was noted to have guaiac negative stool. Patient was hospitalized. She has received 2 units of PRBCs and feels some better. Hemoglobin from this morning was 9.7 g. Patient has at least 9 year history of iron deficiency anemia. She has been evaluated in the past with EGD and colonoscopy. She underwent EGD and colonoscopy in August 2016. She was noted to have stricture at GE junction large hiatal hernia and colonoscopy revealed a 10 mm pedunculated on it which was not an adenoma. She received blood transfusion for anemia. She presented again in November 2016 with anemia and melena. EGD this summer revealed ulcerated mass at cardia and on biopsy returned on to be adenocarcinoma. Patient underwent EUS by Dr. Ardis Hughs. Oncology and surgical consultation was obtained. Patient was referred to Dr. Stephanie Acre of [facility]. She underwent EST of this lesion. It was intramucosal lesion with negative margins. He recommended follow-up EGD in 1 year. Patient denies melena or rectal bleeding. She also denies hematuria or vaginal bleeding. She was treated for H. pylori gastritis few weeks ago. She didn't experience nausea and heartburn while on therapy. She believes last dose was one week ago. She denies abdominal pain. She says her appetite is good but she gets filled up quickly. She says she has gained 10 pounds this year. She does not take OTC NSAIDs. She is on warfarin.   She does complain of dysphagia both to solids as well as liquids. She says she has had this problem off and on for a while. She does not feel it is getting worse. She has not had an episode of food impaction. Patient states she has tried by mouth iron in the past but it makes her sick.  She also tells me that she had evaluation at [facility] few years ago including small bowel given capsule study. Patient says she scheduled for echocardiography in 2 days with Dr. Debara Pickett in [place].    Past Medical History:  Diagnosis Date  .    [name]  2005     History of iron deficiency anemia for at least 9 years.  transfusions in [address], 2012, 02/2015, 05/2015  . Arthritis   . Atrial fibrillation ([place])   . Broken back 2013   chronic back pain.   . H/O CHF (congestive heart failure) ([place])   .  05/06/15   adenocarcinoma ge junction  . GERD (gastroesophageal reflux disease)   .       [name] [facility] (hiatus hernia) 2008   large with associated erosions.   . Hypertension   . PONV (postoperative nausea and vomiting)   . Pulmonary emboli ([place]) 2008, 2012  . Stroke ([place])   . Patient has bilateral cataract.         Past Surgical History:  Procedure Laterality Date  . APPENDECTOMY  1950s  . CARPAL TUNNEL RELEASE Bilateral 1990s  . [place] SURGERY  2014  . CHOLECYSTECTOMY  1980s   open  . COLONOSCOPY N/A 02/17/2015   Procedure: COLONOSCOPY;  Surgeon: [name], MD;  Location: [facility];  Service: Endoscopy;  Laterality: N/A;  . ESOPHAGOGASTRODUODENOSCOPY N/A 02/16/2015   Procedure: ESOPHAGOGASTRODUODENOSCOPY (EGD);  Surgeon: [name], MD;  Location: [facility] ENDOS[facility];  Service: Endoscopy;  Laterality: N/A;  . ESOPHAGOGASTRODUODENOSCOPY N/A 05/06/2015   Procedure: ESOPHAGOGASTRODUODENOSCOPY (EGD);  Surgeon: [name], MD;  Location: [facility] ENDOSCOPY;  Service: Endoscopy;  Laterality: N/A;  . EUS N/A 05/20/2015   Procedure: UPPER ENDOSCOPIC ULTRASOUND (EUS) LINEAR;  Surgeon: [name], MD;   Location: [facility] ENDOSCOPY;  Service: Endoscopy;  Laterality: N/A;  . exploratory lab  1950s or 1960s  . GIVENS CAPSULE STUDY N/A 05/06/2015   Procedure: GIVENS CAPSULE STUDY;  Surgeon: [name], MD;  Location: [facility] ENDOSCOPY;  Service: Endoscopy;  Laterality: N/A;  . lumbar back surgery  2012  . [place] SURGERY  1991  . TONSILLECTOMY AND ADENOIDECTOMY  1960s    Prior to Admission medications   Medication Sig Start Date End Date Taking? Authorizing Provider  acetaminophen (TYLENOL) 500 MG tablet Take 500-1,000 mg by mouth daily as needed for mild pain or moderate pain.    Yes Historical Provider, MD  amLODipine (NORVASC) 10 MG tablet TAKE ONE TABLET BY MOUTH ONCE DAILY. 12/03/15  Yes Golden Circle, [name]  cloNIDine (CATAPRES) 0.1 MG tablet Take 1 tablet (0.1 mg total) by mouth daily as needed (high blood pressure, will take if levels are 180/90). Patient taking differently: Take 0.1 mg by mouth daily as needed (high blood pressure, take if blood pressure is >179/99.).  05/08/15  Yes Jessica U Vann, DO  furosemide (LASIX) 40 MG tablet TAKE 1 TO 2 TABLETS BY MOUTH ONCE DAILY. Patient taking differently: Take 20-40 mg by mouth daily as needed for fluid.  04/19/16  Yes Pixie Casino, MD  gabapentin (NEURONTIN) 100 MG capsule TAKE 1 CAPSULE BY MOUTH THREE TIMES A DAY. 02/22/16  Yes Golden Circle, [name]  meclizine (ANTIVERT) 25 MG tablet Take 1 tablet (25 mg total) by mouth 3 (three) times daily as needed for dizziness. 08/28/14  Yes Bonnielee Haff, MD  metoprolol (LOPRESSOR) 50 MG tablet TAKE 1 TABLET BY MOUTH TWICE DAILY. 12/03/15  Yes Golden Circle, [name]  NONFORMULARY OR COMPOUNDED ITEM Diclofenac/Baclofen/Cyclobenzaprine/Gabapentin/Lidocaine 3/2/2/6/2.5% Cream  Apply to affected area 2-3 times per day as needed for arthritic pain. Patient taking differently: Apply 1 application topically daily as needed (for arthritis). Diclofenac/Baclofen/Cyclobenzaprine/Gabapentin/Lidocaine 3/2/2/6/2.5% Cream   Apply to affected area 2-3 times per day as needed for arthritic pain. 04/16/15  Yes Golden Circle, [name]  ondansetron (ZOFRAN) 4 MG tablet Take 4 mg by mouth every 8 (eight) hours as needed for nausea or vomiting.   Yes Historical Provider, MD  pantoprazole (PROTONIX) 40 MG tablet TAKE 1 TABLET BY MOUTH TWICE DAILY. 05/03/16  Yes Golden Circle, [name]  predniSONE (DELTASONE) 20 MG tablet Take 3 tablets by mouth daily for 4 days, then 2 tablets by mouth daily for 4 days; then 1 tablet daily by mouth for 4 days. 03/01/16  Yes Golden Circle, [name]  simvastatin (ZOCOR) 20 MG tablet TAKE (1) TABLET BY MOUTH AT BEDTIME. 05/03/16  Yes Golden Circle, [name]  temazepam (RESTORIL) 15 MG capsule TAKE 1 CAPSULE BY MOUTH AT BEDTIME. 03/31/16  Yes Golden Circle, [name]  traMADol (ULTRAM) 50 MG tablet Take 1 tablet (50 mg total) by mouth every 8 (eight) hours. 01/24/16  Yes Golden Circle, [name]  valsartan (DIOVAN) 320 MG tablet TAKE 1/2 TABLET  BY MOUTH ONCE DAILY. 12/16/15  Yes Golden Circle, [name]  warfarin (COUMADIN) 5 MG tablet TAKE 1/2 TABLET BY MOUTH ON WEDNESDAY AND 1 TABLET ALL OTHER DAYS. Patient taking differently: TAKE 1/2 TABLET BY MOUTH ON WEDNESDAY AND 1 TABLET ALL OTHER DAYS. TAKES IN THE EVENING 04/17/16  Yes Golden Circle, [name]  amoxicillin (AMOXIL) 500 MG tablet Take 2 tablets (1,000 mg total) by mouth 2 (two) times daily. Patient not taking: Reported on 05/27/2016 04/25/16   Golden Circle, [name]  clarithromycin (BIAXIN) 500 MG tablet Take 1 tablet (500 mg total) by mouth 2 (two) times daily. Patient not taking: Reported on 05/27/2016 04/25/16   Golden Circle, [name]  sucralfate (CARAFATE) 1 g tablet Take 1 tablet (1 g total) by mouth 4 (four) times daily -  with meals and at bedtime. Patient not taking: Reported on 05/27/2016 04/24/16   Golden Circle, [name]    Current Facility-Administered Medications  Medication Dose Route Frequency Provider Last Rate Last Dose  . acetaminophen (TYLENOL)  tablet 650 mg  650 mg Oral Q6H PRN Truett Mainland, DO   650 mg at 05/27/16 2100   Or  . acetaminophen (TYLENOL) suppository 650 mg  650 mg Rectal Q6H PRN Tanna Savoy Stinson, DO      . amLODipine (NORVASC) tablet 10 mg  10 mg Oral Daily Tanna Savoy Stinson, DO   10 mg at 05/28/16 1006  . gabapentin (NEURONTIN) capsule 100 mg  100 mg Oral TID Tanna Savoy Stinson, DO   100 mg at 05/28/16 1006  . irbesartan (AVAPRO) tablet 300 mg  300 mg Oral Daily Tanna Savoy Stinson, DO   300 mg at 05/28/16 1006  . metoprolol (LOPRESSOR) tablet 50 mg  50 mg Oral BID Tanna Savoy Stinson, DO   50 mg at 05/28/16 1006  . ondansetron (ZOFRAN) tablet 4 mg  4 mg Oral Q6H PRN Tanna Savoy Stinson, DO       Or  . ondansetron [facility]) injection 4 mg  4 mg Intravenous Q6H PRN Tanna Savoy Stinson, DO      . pantoprazole (PROTONIX) EC tablet 40 mg  40 mg Oral BID Tanna Savoy Stinson, DO   40 mg at 05/28/16 1006  . simvastatin (ZOCOR) tablet 20 mg  20 mg Oral q1800 Tanna Savoy Stinson, DO   20 mg at 05/27/16 2115  . temazepam (RESTORIL) capsule 15 mg  15 mg Oral QHS PRN Vianne Bulls, MD   15 mg at 05/28/16 0248  . traMADol (ULTRAM) tablet 50 mg  50 mg Oral Q8H Tanna Savoy [place], DO   50 mg at 05/28/16 [id number]    Allergies as of 05/27/2016 - Review Complete 05/27/2016  Allergen Reaction Noted  . Iodinated diagnostic agents Anaphylaxis 10/04/2010  . Ioxaglate Anaphylaxis 09/05/2015  . Milk-related compounds Anaphylaxis 02/06/2014  . Red dye Anaphylaxis 03/24/2016  . Whey  09/05/2015  . Darvon [propoxyphene] Rash 02/06/2014  . Hydralazine Anxiety 08/24/2014    Family history:  Father had emphysema and died at age 14. Mother had coronary artery disease and had CVA and died at age 58. She has one sister living who is at CABG and other health problems. She 88 years old. One sister died of ovarian carcinoma at age 5 and another sister died at age 46 of multiple problems including CVA and psychiatric illness.   Social history:  Patient is divorced. She has  3 children living. One child died soon after birth. She has son age  40 with heart problems. Her son age 61 is in good health and she lives with him(Ken). She has a daughter age 19 in good health. 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 All in all she worked for over 45 years. She has never smoked cigarettes and does not drink alcohol.    Review of Systems: See HPI, otherwise normal ROS  Physical Exam: Temp:  [97.9 F (36.6 C)-98.8 F (37.1 C)] 98.2 F (36.8 C) (11/26 0500) Pulse Rate:  [68-78] 72 (11/26 0500) Resp:  [14-25] 16 (11/26 0500) BP: (137-168)/(35-112) 167/57 (11/26 0500) SpO2:  [94 %-100 %] 98 % (11/26 0500) Weight:  [135 lb (61.2 kg)] 135 lb (61.2 kg) (11/25 1431)   Patient is alert and in no acute distress. She appears pale. Conjunctiva is also pale but sclerae nonicteric. Oropharyngeal mucosa is normal. She has few teeth in upper jaw. No neck masses or thyromegaly noted. Scar noted along left side of neck. Cardiac exam with regular rhythm normal S1 and S2. There is grade 2/6 systolic ejection murmur heard at upper sternal border and aortic area. Lungs are clear to auscultation. Abdomen is symmetrical with normal bowel movements. Bowel sounds are normal. On palpation abdomen is soft and nontender without organomegaly or masses. Rectal examination deferred.   Lab Results:  Recent Labs  05/27/16 1502 05/28/16 0659  WBC 4.5 3.7*  HGB 6.5* 9.7*  HCT 21.7* 30.9*  PLT 183 161   BMET  Recent Labs  05/27/16 1502  NA 135  K 4.2  CL 105  CO2 23  GLUCOSE 165*  BUN 12  CREATININE 1.01*  CALCIUM 8.1*   LFT  Recent Labs  05/27/16 1754  PROT 5.8*  ALBUMIN 3.2*  AST 17  ALT 10*  ALKPHOS 68  BILITOT 0.4  BILIDIR <0.1*  IBILI NOT CALCULATED   PT/INR  Recent Labs  05/27/16 1502 05/28/16 0659  LABPROT 27.8* 26.3*  INR 2.54 2.37     Assessment;  Patient is 75 year old Caucasian female with multiple medical problems including history of pulmonary embolism and atrial fibrillation who is chronically anticoagulated who presents with profound anemia stool is guaiac negative. She has several year history of iron deficiency anemia as well as history of GI bleed. 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 She has known large hiatal hernia and is on acid suppression with PPI. She was recently treated for H. pylori gastritis. Since there is no history of recent overt GI bleed and her stool is guaiac negative I suspect she has impaired iron absorption due to chronic PPI therapy. She does complain of dysphagia both to solids as well as liquids. She feels as mild and not short of she wants to undergo further evaluation which will have to be with barium study to begin with since she is anticoagulated and dilation would not be possible. Given her history she needs to have her H&H checked at frequent intervals and should be given iron infusion or PRBCs as appropriate. It would be worth trying her on low-dose iron since she has not been able to tolerate usual dose or ferrous sulfate 325 mg.   Recommendations;  Will do tomorrow Hemoccults. Will consider barium pill esophagogram if patient desires further evaluation of her dysphagia. Consider Flintstones with iron chewable twice daily when she is discharged. Consider H&H every  6-8 weeks. If stool is guaiac positive will consider EGD during this admission.    LOS: 0 days   Najeeb Rehman  05/28/2016, 1:47 PM

## 2016-05-28 NOTE — Progress Notes (Signed)
  Echocardiogram 2D Echocardiogram has been performed.  [name] 05/28/2016, 4:11 PM

## 2016-05-28 NOTE — Progress Notes (Signed)
PROGRESS NOTE  [name] [id number] DOB: May 05, 1941 DOA: 05/27/2016 PCP: Mauricio Po, [name]  Brief Narrative: 75 year-old woman PMH ADENOCARCINOMA OF THE GASTROESOPHAGEAL JUNCTION DIAGNOSED NOVEMBER 2016 PAF, PE, on warfarin; long-standing history of anemia presented with 7-10 days of worsening shortness of breath, dyspnea on exertion. Found to have severe symptomatic anemia.  Assessment/Plan: 1. Symptomatic severe microcytic anemia, improved status post transfusion 2 units packed red blood cells. Complicated GI history, see in particular GI note 05/2015 by [place]. She has a long history of microcytic anemia and has had multiple studies in the past. This is all complicated by the need for chronic anticoagulation secondary to multiple PE and strokes. She was diagnosed with adenocarcinoma at the gastroesophageal junction 05/2015. She underwent curative resection endoscopically at [facility] January 2017. She continues on anticoagulation. In review of her laboratory studies have see H. pylori IgG was positive October 23/2017. She reports this was treated by her primary nurse practitioner. 2. Paroxymal A-fib. Chads2 vasc score of 7. NSR currently.INR therapeutic.  3. Aortic stenosis. 4. PMH PE. On warfarin. INR therapeutic. 5. PMH GE junction carcinoma s/p curative resection at [facility] January 2017.  6. GERD. Continue Protonix. 7. HTN. Currently elevated. Continue home regimen.    Although her hemoglobin has responded appropriately, she remains obviously symptomatic and short of breath even at rest. Her history suggests a multifactorial etiology, more than just anemia. She was scheduled for an echocardiogram next week having recently seen her cardiologist. We will proceed with echocardiogram here to further evaluate function and specifically known aortic stenosis. Suspect a cardiac component to her shortness of breath. One could even consider ischemic testing if echocardiograms unremarkable but this  would of course be deferred to cardiology. She has been therapeutic on warfarin and her last PE was 2012, and therefore would not consider this to be a likely contributor to shortness of breath at this time. She has no evidence of infection and no history to suggest COPD.  Plan GI consultation for further recommendations. One wonders if she might have an ulcer.  Echocardiogram as above.  DVT prophylaxis: SCDs Code Status: DNR Family Communication: Friend at bedside Disposition Plan: Home next 24 hours  Murray Hodgkins, MD  [facility] Direct contact: [phone] --[place] [place]  --[email or web address]; password [email or web address]  7PM-7AM contact night coverage as above 05/28/2016, 10:56 AM  LOS: 0 days   Consultants:  None  Procedures:  Transfused with 2 units PRBC  Antimicrobials:  None  CC: Follow-up anemia  Interval history/Subjective: She still has SOB upon exertion. She had some nausea when ambulating to the bathroom earlier. Had toast and coffee for breakfast. She reports shortness of breath even with washing her dishes at home.  Objective: Vitals:   05/27/16 2340 05/28/16 0005 05/28/16 0310 05/28/16 0500  BP: (!) 137/112 (!) 149/38 (!) 148/67 (!) 167/57  Pulse: 70 68 69 72  Resp: 18 18 18 16   Temp: 98.3 F (36.8 C) 98.3 F (36.8 C) 97.9 F (36.6 C) 98.2 F (36.8 C)  TempSrc: Oral Oral Oral Oral  SpO2: 97% 95% 97% 98%  Weight:      Height:        Intake/Output Summary (Last 24 hours) at 05/28/16 1056 Last data filed at 05/28/16 0900  Gross per 24 hour  Intake             1010 ml  Output                0 ml  Net             1010 ml     Filed Weights   05/27/16 1431  Weight: 61.2 kg (135 lb)    Exam:    Constitutional:  . Appears calm and comfortable Respiratory:  . CTA bilaterally, no w/r/r.  . Mild increased respiratory effort. She become short of breath even while speaking and requires after a few sentences. Cardiovascular:  . RRR, 2/6  holosystolic murmur, no rubs or gallops . No LE extremity edema  Abdomen:  . Soft; no tenderness or masses  I have personally reviewed following labs and imaging studies:  Hemoglobin 6.5 >> 9.7  INR 2.37  Scheduled Meds: . amLODipine  10 mg Oral Daily  . gabapentin  100 mg Oral TID  . irbesartan  300 mg Oral Daily  . metoprolol  50 mg Oral BID  . pantoprazole  40 mg Oral BID  . simvastatin  20 mg Oral q1800  . traMADol  50 mg Oral Q8H   Continuous Infusions:  Principal Problem:   Symptomatic anemia Active Problems:   PAF (paroxysmal atrial fibrillation) ([name])   Aortic stenosis   History of pulmonary embolism   GERD (gastroesophageal reflux disease)   Anticoagulated on Coumadin   GE junction carcinoma ([name])   LOS: 0 days   Time spent 25 minutes  By signing my name below, I, Hilbert Odor, attest that this documentation has been prepared under the direction and in the presence of [place]. Sarajane Jews, MD. Electronically signed: Hilbert Odor, Scribe.  05/28/16, 9:40 AM  I personally performed the services described in this documentation. All medical record entries made by the scribe were at my direction. I have reviewed the chart and agree that the record reflects my personal performance and is accurate and complete. Murray Hodgkins, MD

## 2016-05-29 ENCOUNTER — Inpatient Hospital Stay (HOSPITAL_COMMUNITY): Payer: Medicare Other

## 2016-05-29 DIAGNOSIS — R1310 Dysphagia, unspecified: Secondary | ICD-10-CM

## 2016-05-29 LAB — CBC
HEMATOCRIT: 28.5 % — AB (ref 36.0–46.0)
HEMOGLOBIN: 9 g/dL — AB (ref 12.0–15.0)
MCH: 24.9 pg — AB (ref 26.0–34.0)
MCHC: 31.6 g/dL (ref 30.0–36.0)
MCV: 78.9 fL (ref 78.0–100.0)
Platelets: 169 10*3/uL (ref 150–400)
RBC: 3.61 MIL/uL — AB (ref 3.87–5.11)
RDW: 16 % — ABNORMAL HIGH (ref 11.5–15.5)
WBC: 8 10*3/uL (ref 4.0–10.5)

## 2016-05-29 LAB — ECHOCARDIOGRAM COMPLETE
Height: 62 in
Weight: 2160 oz

## 2016-05-29 MED ORDER — CYANOCOBALAMIN 1000 MCG/ML IJ SOLN
1000.0000 ug | Freq: Every day | INTRAMUSCULAR | Status: DC
  Administered 2016-05-29 – 2016-05-30 (×2): 1000 ug via INTRAMUSCULAR
  Filled 2016-05-29 (×2): qty 1

## 2016-05-29 MED ORDER — WARFARIN - PHARMACIST DOSING INPATIENT
Freq: Every day | Status: DC
  Administered 2016-05-29: 18:00:00

## 2016-05-29 MED ORDER — WARFARIN SODIUM 5 MG PO TABS
5.0000 mg | ORAL_TABLET | Freq: Once | ORAL | Status: AC
  Administered 2016-05-29: 5 mg via ORAL
  Filled 2016-05-29: qty 1

## 2016-05-29 MED ORDER — FUROSEMIDE 10 MG/ML IJ SOLN
20.0000 mg | Freq: Four times a day (QID) | INTRAMUSCULAR | Status: AC
  Administered 2016-05-29 (×2): 20 mg via INTRAVENOUS
  Filled 2016-05-29 (×2): qty 2

## 2016-05-29 NOTE — Progress Notes (Signed)
ANTICOAGULATION CONSULT NOTE - Initial Consult  Pharmacy Consult for coumadin Indication: atrial fibrillation  Allergies  Allergen Reactions  . Iodinated Diagnostic Agents Anaphylaxis    IPD dye Info given by patient  . Ioxaglate Anaphylaxis    Info given by patient  . Milk-Related Compounds Anaphylaxis    Lactose intolerance  . Red Dye Anaphylaxis  . Whey     Lactose intolerance  . Darvon [Propoxyphene] Rash  . Hydralazine Anxiety    Facial flushing, pt prefers not to use it.     Patient Measurements: Height: 5\' 2"  (157.5 cm) Weight: 135 lb (61.2 kg) IBW/kg (Calculated) : 50.1  Vital Signs: Temp: 98.6 F (37 C) (11/27 1001) Temp Source: Oral (11/27 1001) BP: 145/61 (11/27 1001) Pulse Rate: 71 (11/27 1001)  Labs:  Recent Labs  05/27/16 1502 05/28/16 0659 05/29/16 0457  HGB 6.5* 9.7* 9.0*  HCT 21.7* 30.9* 28.5*  PLT 183 161 169  LABPROT 27.8* 26.3*  --   INR 2.54 2.37  --   CREATININE 1.01*  --   --   TROPONINI <0.03  --   --     Estimated Creatinine Clearance: 40.8 mL/min (by C-G formula based on SCr of 1.01 mg/dL (H)).   Medical History: Past Medical History:  Diagnosis Date  . Allergy   . Anemia 2005   generally microcytic, transfusions in [address], 2012, 02/2015, 05/2015  . Arthritis   . Atrial fibrillation ([place])   . Broken back 2013   chronic back pain.   . CHF (congestive heart failure) ([place])   . Esophageal cancer ([place]) 05/06/15   adenocarcinoma ge junction  . GERD (gastroesophageal reflux disease)   . H/O iron deficiency    05-06-15 iron infusion ([facility])  . HH (hiatus hernia) 2008   large with associated erosions.   . Hypertension   . PONV (postoperative nausea and vomiting)   . Pulmonary emboli ([place]) 2008, 2012  . Stroke ([place])   . Transfusion history    2 units transfused 05-06-15([facility])    Medications:  Prescriptions Prior to Admission  Medication Sig Dispense Refill Last Dose  . acetaminophen (TYLENOL) 500 MG tablet Take 500-1,000 mg  by mouth daily as needed for mild pain or moderate pain.    Past Week at Unknown time  . amLODipine (NORVASC) 10 MG tablet TAKE ONE TABLET BY MOUTH ONCE DAILY. 90 tablet 1 05/27/2016 at Unknown time  . cloNIDine (CATAPRES) 0.1 MG tablet Take 1 tablet (0.1 mg total) by mouth daily as needed (high blood pressure, will take if levels are 180/90). (Patient taking differently: Take 0.1 mg by mouth daily as needed (high blood pressure, take if blood pressure is >179/99.). ) 30 tablet 0 over 30 days  . furosemide (LASIX) 40 MG tablet TAKE 1 TO 2 TABLETS BY MOUTH ONCE DAILY. (Patient taking differently: Take 20-40 mg by mouth daily as needed for fluid. ) 30 tablet 10 Past Week at Unknown time  . gabapentin (NEURONTIN) 100 MG capsule TAKE 1 CAPSULE BY MOUTH THREE TIMES A DAY. 90 capsule 3 05/27/2016 at Unknown time  . meclizine (ANTIVERT) 25 MG tablet Take 1 tablet (25 mg total) by mouth 3 (three) times daily as needed for dizziness. 30 tablet 0 Past Week at Unknown time  . metoprolol (LOPRESSOR) 50 MG tablet TAKE 1 TABLET BY MOUTH TWICE DAILY. 180 tablet 1 05/27/2016 at [place]  . NONFORMULARY OR COMPOUNDED ITEM Diclofenac/Baclofen/Cyclobenzaprine/Gabapentin/Lidocaine 3/2/2/6/2.5% Cream  Apply to affected area 2-3 times per day as needed for arthritic  pain. (Patient taking differently: Apply 1 application topically daily as needed (for arthritis). Diclofenac/Baclofen/Cyclobenzaprine/Gabapentin/Lidocaine 3/2/2/6/2.5% Cream  Apply to affected area 2-3 times per day as needed for arthritic pain.) 1 each 1 Past Week at Unknown time  . ondansetron (ZOFRAN) 4 MG tablet Take 4 mg by mouth every 8 (eight) hours as needed for nausea or vomiting.   Past Week at Unknown time  . pantoprazole (PROTONIX) 40 MG tablet TAKE 1 TABLET BY MOUTH TWICE DAILY. 60 tablet 0 05/27/2016 at Unknown time  . predniSONE (DELTASONE) 20 MG tablet Take 3 tablets by mouth daily for 4 days, then 2 tablets by mouth daily for 4 days; then 1 tablet  daily by mouth for 4 days. 24 tablet 0 unknown  . simvastatin (ZOCOR) 20 MG tablet TAKE (1) TABLET BY MOUTH AT BEDTIME. 30 tablet 0 05/26/2016 at Unknown time  . temazepam (RESTORIL) 15 MG capsule TAKE 1 CAPSULE BY MOUTH AT BEDTIME. 30 capsule 2 05/26/2016 at Unknown time  . traMADol (ULTRAM) 50 MG tablet Take 1 tablet (50 mg total) by mouth every 8 (eight) hours. 90 tablet 0 Past Week at Unknown time  . valsartan (DIOVAN) 320 MG tablet TAKE 1/2 TABLET BY MOUTH ONCE DAILY. 45 tablet 2 05/27/2016 at Unknown time  . warfarin (COUMADIN) 5 MG tablet TAKE 1/2 TABLET BY MOUTH ON WEDNESDAY AND 1 TABLET ALL OTHER DAYS. (Patient taking differently: TAKE 1/2 TABLET BY MOUTH ON WEDNESDAY AND 1 TABLET ALL OTHER DAYS. TAKES IN THE EVENING) 90 tablet 0 05/26/2016 at 2000  . amoxicillin (AMOXIL) 500 MG tablet Take 2 tablets (1,000 mg total) by mouth 2 (two) times daily. (Patient not taking: Reported on 05/27/2016) 40 tablet 0 Not Taking at Unknown time  . clarithromycin (BIAXIN) 500 MG tablet Take 1 tablet (500 mg total) by mouth 2 (two) times daily. (Patient not taking: Reported on 05/27/2016) 20 tablet 0 Not Taking at Unknown time  . sucralfate (CARAFATE) 1 g tablet Take 1 tablet (1 g total) by mouth 4 (four) times daily -  with meals and at bedtime. (Patient not taking: Reported on 05/27/2016) 56 tablet 0 Not Taking at Unknown time    Assessment: 75 yo lady to continue coumadin for afib.  Her INR today is therapeutic.  She has iron deficiency anemia with no evidence for GI bleed. Goal of Therapy:  INR 2-3 Monitor platelets by anticoagulation protocol: Yes   Plan:  Coumadin 5 mg po today. Daily PT/INR Monitor for bleeding complications  Trecia Maring Poteet 05/29/2016,12:28 PM

## 2016-05-29 NOTE — Progress Notes (Signed)
  Subjective:  Patient states she does not feel well. She complains of dyspnea at rest. She denies chest pain. She remains with dysphagia to solids and liquids. She has not had any episode of food impaction. She denies melena or rectal bleeding. Last BM was yesterday.   Objective: Blood pressure (!) 145/61, pulse 71, temperature 98.6 F (37 C), temperature source Oral, resp. rate 16, height 5\' 2"  (1.575 m), weight 135 lb (61.2 kg), SpO2 97 %. Patient is alert. She remains pale. Cardiac exam with regular rhythm normal S1 and S2. Grade 2/6 systolic ejection murmur noted at left upper sternal border. Lungs are clear to auscultation. Abdomen is full but soft and nontender without organomegaly or masses.  No LE edema or clubbing noted.  Labs/studies Results:   Recent Labs  05/27/16 1502 05/28/16 0659 05/29/16 0457  WBC 4.5 3.7* 8.0  HGB 6.5* 9.7* 9.0*  HCT 21.7* 30.9* 28.5*  PLT 183 161 169    BMET   Recent Labs  05/27/16 1502  NA 135  K 4.2  CL 105  CO2 23  GLUCOSE 165*  BUN 12  CREATININE 1.01*  CALCIUM 8.1*    LFT   Recent Labs  05/27/16 1754  PROT 5.8*  ALBUMIN 3.2*  AST 17  ALT 10*  ALKPHOS 68  BILITOT 0.4  BILIDIR <0.1*  IBILI NOT CALCULATED    PT/INR   Recent Labs  05/27/16 1502 05/28/16 0659  LABPROT 27.8* 26.3*  INR 2.54 2.37   Serum iron 5, TIBC 356 and saturation 1% M.D. 06/02/1984 and folate level 16.9.    ECHO results noted. Vigorous systolic function with ejection fraction in the range of 65-70%. Features consistent with pseudo-normal left ventricle or filling pattern with chronic comment abnormal relaxation and increased filling pressures(grade 2 diastolic dysfunction). Severely calcified aortic valve with moderate stenosis. Mild TR. Dilated left and right atria.    Assessment:  #1. Iron deficiency anemia. No evidence of overt or occult GI bleed which she has history of. She either has been losing blood intermittently or not  absorbing iron. She has received 2 units of PRBCs. Hemoglobin is low but stable. She is status post endoscopic resection of cardia adenocarcinoma in January this year. Dr. Stephanie Acre of [facility] recommended EGD in January 2018 for surveillance. He needs to be on by mouth iron at a dose that she can tolerate. #2. Dyspnea at rest appears to be either due to aortic stenosis or diastolic dysfunction or both. Dyspnea does not appear to be due to anemia. Discussed with Dr. Carlean Jews who is planning to call patient's primary cardiologist Dr. Debara Pickett who had recommended ECHO. #3. Dysphagia to liquids and solids. Would consider barium study when she is ready.  Recommendations:  Begin Flint stone with iron chewable 1 tablet by mouth twice a day when she is discharged. Barium pill esophagogram either during this admission or on an outpatient basis.

## 2016-05-29 NOTE — Progress Notes (Addendum)
PROGRESS NOTE  [name] [id number] DOB: October 23, 1940 DOA: 05/27/2016 PCP: Mauricio Po, [name]  Brief Narrative: 75 year-old woman PMH ADENOCARCINOMA OF THE GASTROESOPHAGEAL JUNCTION DIAGNOSED NOVEMBER 2016 PAF, PE, on warfarin; long-standing history of anemia presented with 7-10 days of worsening shortness of breath, dyspnea on exertion. Found to have severe symptomatic anemia.  Assessment/Plan: 1. DOE and rest. Multifactorial, but persists despite adequate Hgb. CXR NAD. INR therapeutic, VTE not likely. No evidence of ACS. Suspect aortic stenosis as primary issue at this point, even though only moderate. 2. Symptomatic severe microcytic anemia, stable s/p transfusion 2 units packed red blood cells. Complicated GI history, see in particular GI note 05/2015 by Gribbin. She has a long history of microcytic anemia and has had multiple studies in the past. This is all complicated by the need for chronic anticoagulation secondary to multiple PE and strokes. She was diagnosed with adenocarcinoma at the gastroesophageal junction 05/2015. She underwent curative resection endoscopically at [facility] January 2017. She continues on anticoagulation. In review of her laboratory studies have see H. pylori IgG was positive October 23/2017. She reports this was treated by her primary nurse practitioner. 3. Paroxymal A-fib. Chads2 vasc score of 7. NSR on admssion.INR therapeutic.  4. Moderate aortic stenosis by echo (last 02/2015 characterized as mild) 5. PMH PE. On warfarin. INR therapeutic. 6. PMH GE junction carcinoma s/p curative resection at [facility] January 2017.  7. GERD. Continue Protonix. 8. HTN. Stable.    Appears worse today, SOB at rest. Plan repeat CXR to exclude occult developing process. Will discuss with Dr. Debara Pickett in regard to aortic stenosis.   No evidence of volume overload but takes PRN Lasix at home. Will trial Lasix here.  Discussed with Dr. Laural Golden, he recs: Flintstones (patient can't  tolerate higher dose iron), patient declines Barium swallow to assess dysphagia. She can f/u as an outpt with him or another GI physician she has seen  B-12 supplementation daily >> weekly >> monthly. F/u as outpatient.  DVT prophylaxis: SCDs Code Status: DNR Family Communication: Friend at bedside Disposition Plan: Home next 24 hours  Murray Hodgkins, MD  [facility] Direct contact: [phone] --[place]  --[email or web address]; password [email or web address]  7PM-7AM contact night coverage as above 05/29/2016, 11:06 AM  LOS: 1 day   Consultants:  GI  Procedures:  Transfused  2 units PRBC  Echo Impressions:  - Mild basal septal LV hypertrophy with LVEF 65-70%. Grade 2   diastolic dysfunction. Mild left atrial enlargement. Moderate   mitral annular calcification with trivial mitral regurgitation.   Moderate calcific aortic stenosis as outlined above. Trivial   tricuspid regurgitation. Mild right atrial enlargement.  Antimicrobials:  None  Interval history/Subjective: Feels poorly, SOB sitting in bed. No pain. Poor appetite.  Objective: Vitals:   05/28/16 1500 05/28/16 2056 05/29/16 0500 05/29/16 1001  BP: (!) 153/48 (!) 146/52 (!) 144/43 (!) 145/61  Pulse: 74 72 62 71  Resp: 18 18 18 16   Temp: 97.7 F (36.5 C) 97.8 F (36.6 C) 98.3 F (36.8 C) 98.6 F (37 C)  TempSrc: Oral Oral Oral Oral  SpO2: 99% 98% 97% 97%  Weight:      Height:        Intake/Output Summary (Last 24 hours) at 05/29/16 1106 Last data filed at 05/28/16 1713  Gross per 24 hour  Intake              240 ml  Output  0 ml  Net              240 ml     Filed Weights   05/27/16 1431  Weight: 61.2 kg (135 lb)    Exam:  Constitutional:   Appears SOB, uncomfortable, but not toxic Respiratory:   CTA bilaterally, no w/r/r.  Moderate increased resp effort, dyspneic at rest, can only speak in short sentences Cardiovascular:   RRR no m/r/g.   No LE edema Abdomen:    soft Psychiatric:   Alert, speech fluent and clear, mood & affect appear normal   I have personally reviewed following labs and imaging studies:  Hemoglobin stable, 9.0  B-12 185  Iron low  INR 2.37  Scheduled Meds: . amLODipine  10 mg Oral Daily  . cyanocobalamin  1,000 mcg Intramuscular Daily  . gabapentin  100 mg Oral TID  . irbesartan  300 mg Oral Daily  . metoprolol  50 mg Oral BID  . pantoprazole  40 mg Oral BID  . simvastatin  20 mg Oral q1800  . traMADol  50 mg Oral Q8H   Continuous Infusions:  Principal Problem:   Symptomatic anemia Active Problems:   PAF (paroxysmal atrial fibrillation) ([name])   Aortic stenosis   History of pulmonary embolism   GERD (gastroesophageal reflux disease)   Anticoagulated on Coumadin   GE junction carcinoma ([name])   Anemia   LOS: 1 day      Time: 40 minutes >50% counseling patient on dx and tx and in discussion with Dr. Debara Pickett and Dr. Laural Golden

## 2016-05-30 DIAGNOSIS — I480 Paroxysmal atrial fibrillation: Secondary | ICD-10-CM

## 2016-05-30 DIAGNOSIS — I5033 Acute on chronic diastolic (congestive) heart failure: Secondary | ICD-10-CM

## 2016-05-30 DIAGNOSIS — R0609 Other forms of dyspnea: Secondary | ICD-10-CM

## 2016-05-30 DIAGNOSIS — I25119 Atherosclerotic heart disease of native coronary artery with unspecified angina pectoris: Secondary | ICD-10-CM

## 2016-05-30 DIAGNOSIS — I350 Nonrheumatic aortic (valve) stenosis: Secondary | ICD-10-CM

## 2016-05-30 DIAGNOSIS — D649 Anemia, unspecified: Secondary | ICD-10-CM

## 2016-05-30 DIAGNOSIS — Z7901 Long term (current) use of anticoagulants: Secondary | ICD-10-CM

## 2016-05-30 DIAGNOSIS — Z5181 Encounter for therapeutic drug level monitoring: Secondary | ICD-10-CM

## 2016-05-30 DIAGNOSIS — Z86711 Personal history of pulmonary embolism: Secondary | ICD-10-CM

## 2016-05-30 LAB — BASIC METABOLIC PANEL
Anion gap: 7 (ref 5–15)
BUN: 18 mg/dL (ref 6–20)
CALCIUM: 7.9 mg/dL — AB (ref 8.9–10.3)
CO2: 28 mmol/L (ref 22–32)
CREATININE: 0.88 mg/dL (ref 0.44–1.00)
Chloride: 103 mmol/L (ref 101–111)
Glucose, Bld: 145 mg/dL — ABNORMAL HIGH (ref 65–99)
Potassium: 3.8 mmol/L (ref 3.5–5.1)
SODIUM: 138 mmol/L (ref 135–145)

## 2016-05-30 LAB — PROTIME-INR
INR: 1.29
PROTHROMBIN TIME: 16.2 s — AB (ref 11.4–15.2)

## 2016-05-30 MED ORDER — WARFARIN - PHARMACIST DOSING INPATIENT: Status: DC

## 2016-05-30 MED ORDER — CYANOCOBALAMIN 1000 MCG/ML IJ SOLN: INTRAMUSCULAR | 0 refills | Status: DC

## 2016-05-30 MED ORDER — ISOSORBIDE MONONITRATE ER 60 MG PO TB24
30.0000 mg | ORAL_TABLET | Freq: Every day | ORAL | Status: DC
  Administered 2016-05-30: 30 mg via ORAL
  Filled 2016-05-30: qty 1

## 2016-05-30 MED ORDER — ISOSORBIDE MONONITRATE ER 30 MG PO TB24: 30.0000 mg | ORAL_TABLET | Freq: Every day | ORAL | 0 refills | Status: DC

## 2016-05-30 MED ORDER — FUROSEMIDE 40 MG PO TABS: 40.0000 mg | ORAL_TABLET | Freq: Every day | ORAL | 0 refills | Status: DC

## 2016-05-30 MED ORDER — WARFARIN SODIUM 5 MG PO TABS: 5.0000 mg | ORAL_TABLET | Freq: Once | ORAL | Status: DC

## 2016-05-30 MED ORDER — FLINTSTONES PLUS IRON PO CHEW: 2.0000 | CHEWABLE_TABLET | Freq: Every day | ORAL | 0 refills | Status: DC

## 2016-05-30 NOTE — Discharge Summary (Addendum)
Physician Discharge Summary  [name] [id number] DOB: 11-Jul-1940 DOA: 05/27/2016  PCP: Mauricio Po, [name]  Admit date: 05/27/2016 Discharge date: 05/30/2016  Recommendations for Outpatient Follow-up:  1. F/u moderate aortic stenosis 2. Started on Lasix 40 mg daily (previously taking on PRN), started on Imdur 30 mg daily by cardiology. Check BMP 1-2 weeks. 3. Flintstones (patient can't tolerate higher dose iron), patient declines Barium swallow to assess dysphagia for now. Dr. Laural Golden will arrange outpatient f/u with her and follow CBC. 4. B-12 supplementation daily >> weekly >> monthly. F/u as outpatient. Patient has friend who can perform injections for her. 5. Outpatient PT  Follow-up Information    Mauricio Po, [name]. Schedule an appointment as soon as possible for a visit in 1 week(s).   Specialty:  Family Medicine Contact information: [place]  [phone]        Hildred Laser, MD Follow up.   Specialty:  Gastroenterology Why:  office will contact you with appointment Contact information: [place], [address] [place] [address] [phone]          Discharge Diagnoses:  1. Symptomatic severe iron-deficiency anemia 2. Dyspnea at rest 3. Moderate aortic stenosis  4. Paroxymal atrial fibrillation 5. PMH PE  6. PMH GE junction carcinoma s/p curative resection at [facility] January 2017.    Discharge Condition: improved Disposition: home  Diet recommendation: low salt  Filed Weights   05/27/16 1431  Weight: 61.2 kg (135 lb)    History of present illness:   Hospital Course:  Transfused 2 units PRBC with appropriate rise in Hgb, but despite Hgb stability and no bleeding, remained dyspneic at rest. GI recommended oral iron and outpatient follow-up, serial CBC. Echo revealed moderate aortic stenosis. Cardiology recommended Lasix and Imdur (possible microvascular angina). Improved on day of discharge, did with with PT and  had no hypoxia. Plan home with outpatient follow-up.  1. DOE and rest. Multifactorial, and currently resolved. Secondary to anemia, aortic stenosis, edema. CXR NAD. INR therapeutic on admission, VTE not likely. No evidence of ACS.  2. Symptomatic severe iron-deficiency anemia, stable s/p transfusion 2 units packed red blood cells. Complicated GI history, see in particular GI note 05/2015 by Gribbin. She has a long history of microcytic anemia and has had multiple studies in the past. This is all complicated by the need for chronic anticoagulation secondary to multiple PE and strokes. She was diagnosed with adenocarcinoma at the gastroesophageal junction 05/2015. She underwent curative resection endosc[facility] at [facility] January 2017. She continues on anticoagulation. In review of her laboratory studies have see H. pylori IgG was positive October 23/2017. She reports this was treated by her primary nurse practitioner. 3. Paroxymal A-fib. Chads2 vasc score of 7.   4. Moderate aortic stenosis by echo   5. PMH PE. On warfarin.   6. PMH GE junction carcinoma s/p curative resection at [facility] January 2017.  7. GERD. Continue Protonix. 8. HTN. Remains stable.   Consultants:  GI  Cardiology   Procedures:  Transfused  2 units PRBC  Echo Impressions:  - Mild basal septal LV hypertrophy with LVEF 65-70%. Grade 2 diastolic dysfunction. Mild left atrial enlargement. Moderate mitral annular calcification with trivial mitral regurgitation. Moderate calcific aortic stenosis as outlined above. Trivial tricuspid regurgitation. Mild right atrial enlargement.  Antimicrobials:  None Discharge Instructions  Discharge Instructions    Ambulatory referral to Physical Therapy    Complete by:  As directed    Diet - low sodium  heart healthy    Complete by:  As directed    Discharge instructions    Complete by:  As directed    Call your physician or seek immediate medical attention for shortness of  breath, fever, bleeding, lightheadedness or worsening of condition.   Increase activity slowly    Complete by:  As directed        Medication List    STOP taking these medications   amLODipine 10 MG tablet Commonly known as:  NORVASC   predniSONE 20 MG tablet Commonly known as:  DELTASONE   sucralfate 1 g tablet Commonly known as:  CARAFATE     TAKE these medications   acetaminophen 500 MG tablet Commonly known as:  TYLENOL Take 500-1,000 mg by mouth daily as needed for mild pain or moderate pain.   amoxicillin 500 MG tablet Commonly known as:  AMOXIL Take 2 tablets (1,000 mg total) by mouth 2 (two) times daily.   clarithromycin 500 MG tablet Commonly known as:  BIAXIN Take 1 tablet (500 mg total) by mouth 2 (two) times daily.   cloNIDine 0.1 MG tablet Commonly known as:  CATAPRES Take 1 tablet (0.1 mg total) by mouth daily as needed (high blood pressure, will take if levels are 180/90). What changed:  reasons to take this   cyanocobalamin 1000 MCG/ML injection Commonly known as:  (VITAMIN B-12) IM daily 11/29-12/3, then weekly x4, then monthly.   FLINTSTONES PLUS IRON chewable tablet Chew 2 tablets by mouth daily.   furosemide 40 MG tablet Commonly known as:  LASIX Take 1 tablet (40 mg total) by mouth daily. What changed:  how much to take  how to take this  when to take this  additional instructions   gabapentin 100 MG capsule Commonly known as:  NEURONTIN TAKE 1 CAPSULE BY MOUTH THREE TIMES A DAY.   isosorbide mononitrate 30 MG 24 hr tablet Commonly known as:  IMDUR Take 1 tablet (30 mg total) by mouth daily. Start taking on:  05/31/2016   meclizine 25 MG tablet Commonly known as:  ANTIVERT Take 1 tablet (25 mg total) by mouth 3 (three) times daily as needed for dizziness.   metoprolol 50 MG tablet Commonly known as:  LOPRESSOR TAKE 1 TABLET BY MOUTH TWICE DAILY.   NONFORMULARY OR COMPOUNDED  ITEM Diclofenac/Baclofen/Cyclobenzaprine/Gabapentin/Lidocaine 3/2/2/6/2.5% Cream  Apply to affected area 2-3 times per day as needed for arthritic pain. What changed:  how much to take  how to take this  when to take this  reasons to take this  additional instructions   ondansetron 4 MG tablet Commonly known as:  ZOFRAN Take 4 mg by mouth every 8 (eight) hours as needed for nausea or vomiting.   pantoprazole 40 MG tablet Commonly known as:  PROTONIX TAKE 1 TABLET BY MOUTH TWICE DAILY.   simvastatin 20 MG tablet Commonly known as:  ZOCOR TAKE (1) TABLET BY MOUTH AT BEDTIME.   temazepam 15 MG capsule Commonly known as:  RESTORIL TAKE 1 CAPSULE BY MOUTH AT BEDTIME.   traMADol 50 MG tablet Commonly known as:  ULTRAM Take 1 tablet (50 mg total) by mouth every 8 (eight) hours.   valsartan 320 MG tablet Commonly known as:  DIOVAN TAKE 1/2 TABLET BY MOUTH ONCE DAILY.   warfarin 5 MG tablet Commonly known as:  COUMADIN TAKE 1/2 TABLET BY MOUTH ON WEDNESDAY AND 1 TABLET ALL OTHER DAYS. What changed:  See the new instructions.      Allergies  Allergen Reactions  .  Iodinated Diagnostic Agents Anaphylaxis    IPD dye Info given by patient  . Ioxaglate Anaphylaxis    Info given by patient  . Milk-Related Compounds Anaphylaxis    Lactose intolerance  . Red Dye Anaphylaxis  . Whey     Lactose intolerance  . Darvon [Propoxyphene] Rash  . Hydralazine Anxiety    Facial flushing, pt prefers not to use it.     The results of significant diagnostics from this hospitalization (including imaging, microbiology, ancillary and laboratory) are listed below for reference.    Significant Diagnostic Studies: Dg Chest 2 View  Result Date: 05/27/2016 CLINICAL DATA:  75 year old female with a history of chest pain and shortness of breath EXAM: CHEST  2 VIEW COMPARISON:  04/19/2016, 09/05/2015, CT chest 05/07/2015 FINDINGS: Cardiomediastinal silhouette unchanged. Double density over  the lower mediastinum is unchanged. Calcifications of the aortic arch. Blunting of the sulcus on the lateral view is unchanged. No pneumothorax.  No confluent airspace disease. Surgical changes of the cervical region. No displaced fracture. Degenerative changes of the thoracic spine. IMPRESSION: Chronic lung changes without evidence of acute cardiopulmonary disease. Re- demonstration of large hiatal hernia. Aortic atherosclerosis. Signed, Dulcy Fanny. Earleen Newport, DO [facility] Electronically Signed   By: Corrie Mckusick D.O.   On: 05/27/2016 15:17   Dg Chest Port 1 View  Result Date: 05/29/2016 CLINICAL DATA:  Worsening shortness of breath EXAM: PORTABLE CHEST 1 VIEW COMPARISON:  05/27/2016 FINDINGS: Surgical changes of the cervical spine. Mild cardiomegaly. There is mild central vascular congestion. Mild interstitial prominence suggests mild perihilar edema. No effusion. Atherosclerosis. No pneumothorax. Lower mediastinal opacity suspected to represent hiatal hernia. IMPRESSION: Mild cardiomegaly with mild central vascular congestion and mild diffuse interstitial prominence, suggesting mild edema. Electronically Signed   By: Donavan Foil M.D.   On: 05/29/2016 20:11    Labs: Basic Metabolic Panel:  Recent Labs Lab 05/27/16 1502 05/30/16 0452  NA 135 138  K 4.2 3.8  CL 105 103  CO2 23 28  GLUCOSE 165* 145*  BUN 12 18  CREATININE 1.01* 0.88  CALCIUM 8.1* 7.9*   Liver Function Tests:  Recent Labs Lab 05/27/16 1754  AST 17  ALT 10*  ALKPHOS 68  BILITOT 0.4  PROT 5.8*  ALBUMIN 3.2*   CBC:  Recent Labs Lab 05/27/16 1502 05/28/16 0659 05/29/16 0457  WBC 4.5 3.7* 8.0  HGB 6.5* 9.7* 9.0*  HCT 21.7* 30.9* 28.5*  MCV 77.2* 78.4 78.9  PLT 183 161 169   Cardiac Enzymes:  Recent Labs Lab 05/27/16 1502  TROPONINI <0.03     Principal Problem:   Symptomatic anemia Active Problems:   PAF (paroxysmal atrial fibrillation)  ([name])   Aortic stenosis   History of pulmonary embolism   GERD (gastroesophageal reflux disease)   Anticoagulated on Coumadin   GE junction carcinoma ([name])   Anemia   Time coordinating discharge: 35 minutes  Signed:  Murray Hodgkins, MD [facility] 05/30/2016, 3:54 PM

## 2016-05-30 NOTE — Care Management Note (Signed)
Case Management Note  Patient Details  Name: [name] MRN: [id number] Date of Birth: 1941/05/20  Subjective/Objective:    Patient adm from home with symptomatic anemia. She lives with her son, who is out of town often. She has a cane and walker PTA. Still drives, has PCP in [place]. Would like HH RN, she has had [facility] in the past, and would like to again. PT has recommended OP PT. Patient agreeable to using [facility] if not able to have [facility].                 Action/Plan: Romualdo Bolk of [facility] notified and will obtain orders from chart. [facility] RN ordered, will follow up on possibility of [facility] PT as well. OP PT ordered currently. Patient aware [facility] has 48 hours to initiate services.    Expected Discharge Date:       05/31/2016           Expected Discharge Plan:  [facility]  In-House Referral:  NA  Discharge planning Services  CM Consult  Post Acute Care Choice:  Home Health Choice offered to:  Patient  DME Arranged:    DME Agency:     HH Arranged:  RN [place] Agency:  [facility]  Status of Service:  In process, will continue to follow  If discussed at Long Length of Stay Meetings, dates discussed:    Additional Comments:  Derin Granquist, Chauncey [place], RN 05/30/2016, 3:22 PM

## 2016-05-30 NOTE — Progress Notes (Signed)
[facility] for coumadin Indication: atrial fibrillation  Allergies  Allergen Reactions  . Iodinated Diagnostic Agents Anaphylaxis    IPD dye Info given by patient  . Ioxaglate Anaphylaxis    Info given by patient  . Milk-Related Compounds Anaphylaxis    Lactose intolerance  . Red Dye Anaphylaxis  . Whey     Lactose intolerance  . Darvon [Propoxyphene] Rash  . Hydralazine Anxiety    Facial flushing, pt prefers not to use it.    Patient Measurements: Height: 5\' 2"  ([id number] cm) Weight: 135 lb (61.2 kg) IBW/kg (Calculated) : 50.1  Vital Signs: Temp: 98 F (36.7 C) (11/28 0500) Temp Source: Oral (11/28 0500) BP: 134/50 (11/28 0500) Pulse Rate: 74 (11/28 0500)  Labs:  Recent Labs  05/27/16 1502 05/28/16 0659 05/29/16 0457 05/30/16 0452 05/30/16 0954  HGB 6.5* 9.7* 9.0*  --   --   HCT 21.7* 30.9* 28.5*  --   --   PLT 183 161 169  --   --   LABPROT 27.8* 26.3*  --   --  16.2*  INR 2.54 2.37  --   --  1.29  CREATININE 1.01*  --   --  0.88  --   TROPONINI <0.03  --   --   --   --     Estimated Creatinine Clearance: 46.8 mL/min (by C-G formula based on SCr of 0.88 mg/dL).   Medical History: Past Medical History:  Diagnosis Date  . Allergy   . Anemia 2005   generally microcytic, transfusions in [address], 2012, 02/2015, 05/2015  . Arthritis   . Atrial fibrillation ([place])   . Broken back 2013   chronic back pain.   . CHF (congestive heart failure) ([place])   . Esophageal cancer ([place]) 05/06/15   adenocarcinoma ge junction  . GERD (gastroesophageal reflux disease)   . H/O iron deficiency    05-06-15 iron infusion ([facility])  . HH (hiatus hernia) 2008   large with associated erosions.   . Hypertension   . PONV (postoperative nausea and vomiting)   . Pulmonary emboli ([place]) 2008, 2012  . Stroke ([place] [place])   . Transfusion history    2 units transfused 05-06-15([facility])    Medications:  Prescriptions Prior to Admission  Medication Sig Dispense  Refill Last Dose  . acetaminophen (TYLENOL) 500 MG tablet Take 500-1,000 mg by mouth daily as needed for mild pain or moderate pain.    Past Week at Unknown time  . amLODipine (NORVASC) 10 MG tablet TAKE ONE TABLET BY MOUTH ONCE DAILY. 90 tablet 1 05/27/2016 at Unknown time  . cloNIDine (CATAPRES) 0.1 MG tablet Take 1 tablet (0.1 mg total) by mouth daily as needed (high blood pressure, will take if levels are 180/90). (Patient taking differently: Take 0.1 mg by mouth daily as needed (high blood pressure, take if blood pressure is >179/99.). ) 30 tablet 0 over 30 days  . furosemide (LASIX) 40 MG tablet TAKE 1 TO 2 TABLETS BY MOUTH ONCE DAILY. (Patient taking differently: Take 20-40 mg by mouth daily as needed for fluid. ) 30 tablet 10 Past Week at Unknown time  . gabapentin (NEURONTIN) 100 MG capsule TAKE 1 CAPSULE BY MOUTH THREE TIMES A DAY. 90 capsule 3 05/27/2016 at Unknown time  . meclizine (ANTIVERT) 25 MG tablet Take 1 tablet (25 mg total) by mouth 3 (three) times daily as needed for dizziness. 30 tablet 0 Past Week at Unknown time  . metoprolol (LOPRESSOR) 50 MG  tablet TAKE 1 TABLET BY MOUTH TWICE DAILY. 180 tablet 1 05/27/2016 at [facility]  . NONFORMULARY OR COMPOUNDED ITEM Diclofenac/Baclofen/Cyclobenzaprine/Gabapentin/Lidocaine 3/2/2/6/2.5% Cream  Apply to affected area 2-3 times per day as needed for arthritic pain. (Patient taking differently: Apply 1 application topically daily as needed (for arthritis). Diclofenac/Baclofen/Cyclobenzaprine/Gabapentin/Lidocaine 3/2/2/6/2.5% Cream  Apply to affected area 2-3 times per day as needed for arthritic pain.) 1 each 1 Past Week at Unknown time  . ondansetron (ZOFRAN) 4 MG tablet Take 4 mg by mouth every 8 (eight) hours as needed for nausea or vomiting.   Past Week at Unknown time  . pantoprazole (PROTONIX) 40 MG tablet TAKE 1 TABLET BY MOUTH TWICE DAILY. 60 tablet 0 05/27/2016 at Unknown time  . predniSONE (DELTASONE) 20 MG tablet Take 3 tablets by mouth  daily for 4 days, then 2 tablets by mouth daily for 4 days; then 1 tablet daily by mouth for 4 days. 24 tablet 0 unknown  . simvastatin (ZOCOR) 20 MG tablet TAKE (1) TABLET BY MOUTH AT BEDTIME. 30 tablet 0 05/26/2016 at Unknown time  . temazepam (RESTORIL) 15 MG capsule TAKE 1 CAPSULE BY MOUTH AT BEDTIME. 30 capsule 2 05/26/2016 at Unknown time  . traMADol (ULTRAM) 50 MG tablet Take 1 tablet (50 mg total) by mouth every 8 (eight) hours. 90 tablet 0 Past Week at Unknown time  . valsartan (DIOVAN) 320 MG tablet TAKE 1/2 TABLET BY MOUTH ONCE DAILY. 45 tablet 2 05/27/2016 at Unknown time  . warfarin (COUMADIN) 5 MG tablet TAKE 1/2 TABLET BY MOUTH ON WEDNESDAY AND 1 TABLET ALL OTHER DAYS. (Patient taking differently: TAKE 1/2 TABLET BY MOUTH ON WEDNESDAY AND 1 TABLET ALL OTHER DAYS. TAKES IN THE EVENING) 90 tablet 0 05/26/2016 at 2000  . amoxicillin (AMOXIL) 500 MG tablet Take 2 tablets (1,000 mg total) by mouth 2 (two) times daily. (Patient not taking: Reported on 05/27/2016) 40 tablet 0 Not Taking at Unknown time  . clarithromycin (BIAXIN) 500 MG tablet Take 1 tablet (500 mg total) by mouth 2 (two) times daily. (Patient not taking: Reported on 05/27/2016) 20 tablet 0 Not Taking at Unknown time  . sucralfate (CARAFATE) 1 g tablet Take 1 tablet (1 g total) by mouth 4 (four) times daily -  with meals and at bedtime. (Patient not taking: Reported on 05/27/2016) 56 tablet 0 Not Taking at Unknown time    Assessment: 75 yo lady to continue coumadin for afib.  Her INR today is SUBtherapeutic.  She has iron deficiency anemia with no evidence for GI bleed. Goal of Therapy:  INR 2-3 Monitor platelets by anticoagulation protocol: Yes   Plan:  Coumadin 5 mg po today. Daily PT/INR Monitor for bleeding complications  [name] Robinsons A 05/30/2016,10:45 AM

## 2016-05-30 NOTE — Progress Notes (Signed)
Patient has orders for discharge. IV and telemetry removed. Explained new medication regimen and change in medication regimen with patient. Patient expressed understanding and has no further questions or concerns at this time. Patient's sister will be here at 1700 per patient to take her home.   Celestia Khat, RN

## 2016-05-30 NOTE — Care Management (Signed)
Spoke with patient over the phone, she will only be eligible for OP PT per recommendations. Ordered placed for [facility]. Attending notified.

## 2016-05-30 NOTE — Evaluation (Addendum)
Physical Therapy Evaluation Patient Details Name: [name] MRN: [id number] DOB: 1940/11/24 Today's Date: 05/30/2016   History of Present Illness  75 y.o. 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 Patient seen for 7-10 days of worsening shortness of breath. Dyspnea worse with exertion and improved with rest. Also worse with conversation. Found be severely anemic here. Patient has had severe anemia in the past requiring blood transfusions. She denies abdominal pain.  Pt also dx with moderate aortic stenosis or possible diastolic dysfunction.    Clinical Impression  Pt received in bed, and is agreeable to PT evaluation.  Pt expressed that her son lives with her, but he travels frequently for work.  Pt states that she is normally is independent with ambulation, but will use her RW or walking stick if she is having a bad day.  She is still driving, but states her son has been doing the grocery shopping due to her poor endurance.  Interestingly, she is c/o 6/10 toothache pain today. During PT evaluation, she was able to perform all functional mobility at independent level - including gait x 274f, and 4 steps.  During ambulation her SpO2 remained >90% on RA, however her HR ranged from 54bpm to 78bpm and per tele, she is in Afib.  Her RPE on the modified BORG scale is an 8/10.  At this point, she is recommended for f/u with OPPT due to instability during gait and increase risk for falling, however, no further acute PT needs at this time.    Follow Up Recommendations Outpatient PT (for balance and activity tolerance)    Equipment Recommendations  None recommended by PT    Recommendations for Other Services       Precautions / Restrictions Precautions Precautions: Fall Precaution Comments: Due to noted balance deficits during gait.   Restrictions Weight Bearing Restrictions: No      Mobility  Bed Mobility Overal bed mobility: Independent (HOB raised - pt has been sleeping more in a recliner recently due to reflux. )                Transfers Overall transfer level: Independent   Transfers: Sit to/from Stand              Ambulation/Gait Ambulation/Gait assistance: Independent Ambulation Distance (Feet): 200 Feet Assistive device: None Gait Pattern/deviations: Trunk flexed;Staggering left;Staggering right   Gait velocity interpretation: <1.8 ft/sec, indicative of risk for recurrent falls General Gait Details: Pt demonstrated LOB x 2-3 which she was able to self correct before PT needed to provide assistance.  She also reaching out for the wall rail and objects to stabilize herself with and expressed that she would be using her walking stick at home if she felt the way she does.  Pt also required 2 short standing rest breaks due to increased back discomfort.  RN reports that tele informed her that pt was in A-fib during mobiltiy with HR noted to range from 54bpm to 78bpm on dynamap.  Pt's BORG rating is 8/10.  Stairs Stairs: Yes Stairs assistance: Independent Stair Management: One rail Right;Step to pattern;Forwards Number of Stairs: 4    Wheelchair Mobility    Modified Rankin (Stroke Patients Only)       Balance Overall balance assessment:  (noted instabiltiy during gait, but not formally tested. )  Pertinent Vitals/Pain Pain Assessment: 0-10 Pain Score: 6  Pain Location: toothache Pain Descriptors / Indicators: Aching Pain Intervention(s): Limited activity within patient's tolerance;Monitored during session;Repositioned    Home Living   Living Arrangements: Children (Son who is not there a good majority of the time due to traveling for work. )   Type of Home: House Home Access: Stairs to enter   Technical brewer  of Steps: 4 Home Layout: One level Home Equipment: Environmental consultant - 2 wheels;Other (comment) (walking stick) Additional Comments: Pt states she has been sleeping more in her recliner due to reflux.     Prior Function Level of Independence: Independent;Independent with assistive device(s)   Gait / Transfers Assistance Needed: Pt states that she uses a RW or her walking stick on "bad days," but otherwise she does not use any DME.    ADL's / Homemaking Assistance Needed: independent with dressing and bathing.  Pt states she is still driving, however her son normally does the grocery shopping due to pt's poor endurance.         Hand Dominance        Extremity/Trunk Assessment   Upper Extremity Assessment: Overall WFL for tasks assessed           Lower Extremity Assessment: Overall WFL for tasks assessed         Communication   Communication: No difficulties  Cognition Arousal/Alertness: Awake/alert Behavior During Therapy: WFL for tasks assessed/performed Overall Cognitive Status: Within Functional Limits for tasks assessed                      General Comments      Exercises     Assessment/Plan    PT Assessment All further PT needs can be met in the next venue of care  PT Problem List Decreased strength;Decreased activity tolerance;Decreased balance;Decreased mobility;Cardiopulmonary status limiting activity          PT Treatment Interventions      PT Goals (Current goals can be found in the Care Plan section)  Acute Rehab PT Goals PT Goal Formulation: All assessment and education complete, DC therapy    Frequency     Barriers to discharge        Co-evaluation               End of Session Equipment Utilized During Treatment: Gait belt Activity Tolerance: Patient limited by fatigue Patient left: in chair;with call bell/phone within reach Nurse Communication: Mobility status Psychologist, occupational, RN notified of pt's vitals, and mobiltiy status.  )     Functional Assessment Tool Used: [employer] "6-clicks"  Functional Limitation: Mobility: Walking and moving around Mobility: Walking and Moving Around Current Status [phone]): 0 percent impaired, limited or restricted Mobility: Walking and Moving Around Goal Status [phone]): 0 percent impaired, limited or restricted Mobility: Walking and Moving Around Discharge Status [phone]): 0 percent impaired, limited or restricted    Time: 1027-1050 PT Time Calculation (min) (ACUTE ONLY): 23 min   Charges:   PT Evaluation $PT Eval Low Complexity: 1 Procedure PT Treatments $Gait Training: 8-22 mins   PT G Codes:   PT G-Codes **NOT FOR INPATIENT CLASS** Functional Assessment Tool Used: [employer] AM-PAC "6-clicks"  Functional Limitation: Mobility: Walking and moving around Mobility: Walking and Moving Around Current Status [phone]): 0 percent impaired, limited or restricted Mobility: Walking and Moving Around Goal Status [phone]): 0 percent impaired, limited or restricted Mobility: Walking and Moving Around Discharge Status [phone]): 0 percent impaired, limited or  restricted    Tacy Learn, PT, DPT X: [phone]

## 2016-05-30 NOTE — Progress Notes (Signed)
SATURATION QUALIFICATIONS: (This note is used to comply with regulatory documentation for home oxygen)  Patient Saturations on Room Air at Rest = 98%  Patient Saturations on Room Air while Ambulating = 90%

## 2016-05-30 NOTE — Progress Notes (Signed)
PROGRESS NOTE  [name] [id number] DOB: 1940-09-10 DOA: 05/27/2016 PCP: Mauricio Po, [name]  Brief Narrative: 75 year-old woman PMH ADENOCARCINOMA OF THE GASTROESOPHAGEAL JUNCTION DIAGNOSED NOVEMBER 2016 PAF, PE, on warfarin; long-standing history of anemia presented with 7-10 days of worsening shortness of breath, dyspnea on exertion. Found to have severe symptomatic anemia.  Assessment/Plan: 1. DOE and rest. Multifactorial, and currently resolved. Secondary to anemia, aortic stenosis, edema. CXR NAD. INR therapeutic on admission, VTE not likely. No evidence of ACS.  2. Symptomatic severe iron-deficiency anemia, stable s/p transfusion 2 units packed red blood cells. Complicated GI history, see in particular GI note 05/2015 by [place]. She has a long history of microcytic anemia and has had multiple studies in the past. This is all complicated by the need for chronic anticoagulation secondary to multiple PE and strokes. She was diagnosed with adenocarcinoma at the gastroesophageal junction 05/2015. She underwent curative resection endoscopically at [facility] January 2017. She continues on anticoagulation. In review of her laboratory studies have see H. pylori IgG was positive October 23/2017. She reports this was treated by her primary nurse practitioner. 3. Paroxymal A-fib. Chads2 vasc score of 7.   4. Moderate aortic stenosis by echo   5. PMH PE. On warfarin.   6. PMH GE junction carcinoma s/p curative resection at [facility] January 2017.  7. GERD. Continue Protonix. 8. HTN. Remains stable.   Lasix 40 mg BID, started on Imdur 30 mg daily by cardiology   Discussed with Dr. Laural Golden again 11/28, ok for d/c, he recs: [place] (patient can't tolerate higher dose iron), patient declines Barium swallow to assess dysphagia for now. He will arrange outpatient f/u with her and follow CBC.  B-12 supplementation daily >> weekly >> monthly. F/u as outpatient. Patient has friend who can perform  injections for her.  HH RN and PT  Murray Hodgkins, MD  [facility] Direct contact: [phone] --Via amion app OR  --[email or web address]; password [email or web address]  7PM-7AM contact night coverage as above 05/30/2016, 3:22 PM  LOS: 2 days   Consultants:  GI  Procedures:  Transfused  2 units PRBC  Echo Impressions:  - Mild basal septal LV hypertrophy with LVEF 65-70%. Grade 2   diastolic dysfunction. Mild left atrial enlargement. Moderate   mitral annular calcification with trivial mitral regurgitation.   Moderate calcific aortic stenosis as outlined above. Trivial   tricuspid regurgitation. Mild right atrial enlargement.  Antimicrobials:  None  Interval history/Subjective: Did well with PT.  Feels better, breathing better, eating ok.  Objective: Vitals:   05/30/16 0500 05/30/16 1030 05/30/16 1059 05/30/16 1434  BP: (!) 134/50   (!) 110/47  Pulse: 74 (!) 54 63 70  Resp: 20   18  Temp: 98 F (36.7 C)   98.5 F (36.9 C)  TempSrc: Oral   Oral  SpO2: 99% 97% 94% 96%  Weight:      Height:        Intake/Output Summary (Last 24 hours) at 05/30/16 1522 Last data filed at 05/30/16 0947  Gross per 24 hour  Intake              240 ml  Output             1500 ml  Net            -1260 ml     Filed Weights   05/27/16 1431  Weight: 61.2 kg (135 lb)    Exam: Constitutional:   Appears calm and comfortable Respiratory:  CTA bilaterally, no w/r/r. Normal resp effort. Cardiovascular:   RRR 2/6 holosystolic murmur, no r/g. No LE edema Psychiatric:   Alert, speech fluent and clear    I have personally reviewed following labs and imaging studies:  BMP unremarkable  Scheduled Meds: . amLODipine  10 mg Oral Daily  . cyanocobalamin  1,000 mcg Intramuscular Daily  . gabapentin  100 mg Oral TID  . irbesartan  300 mg Oral Daily  . isosorbide mononitrate  30 mg Oral Daily  . metoprolol  50 mg Oral BID  . pantoprazole  40 mg Oral BID  . simvastatin  20 mg Oral  q1800  . traMADol  50 mg Oral Q8H  . warfarin  5 mg Oral Once  . Warfarin - Pharmacist Dosing Inpatient   Does not apply Q24H   Continuous Infusions:  Principal Problem:   Symptomatic anemia Active Problems:   PAF (paroxysmal atrial fibrillation) ([name])   Aortic stenosis   History of pulmonary embolism   GERD (gastroesophageal reflux disease)   Anticoagulated on Coumadin   GE junction carcinoma ([name])   Anemia   LOS: 2 days

## 2016-05-30 NOTE — Progress Notes (Signed)
Patient placed on tele monitor as order. Patient is in afib. Dr. Sarajane Jews notified.

## 2016-05-30 NOTE — Consult Note (Signed)
CARDIOLOGY CONSULT NOTE   Patient ID: SAMANVI BERKERY MRN: [id number] DOB/AGE: 03/25/1941 75 y.o.  Admit Date: 05/27/2016 Referring Physician: TRH-Goodrich MD Primary Physician: Mauricio Po, [place] Consulting Cardiologist: Kate Sable MD Primary Cardiologist: Lyman Bishop MD  Reason for Consultation: Dyspnea, AoV stenosis  Clinical Summary Ms. 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 She was recently seen by cardiology, Dr. Debara Pickett, on 04/19/2016 was complaining of shortness of breath at that time. Echocardiogram was ordered. This revealed normal systolic function, grade 2 diastolic dysfunction, moderate aortic valve stenosis without significant regurgitation.  She is followed by [facility] surgeon post resection of carcinoma and is due to see them again in January. She does not have a hematologist or oncologist. She states she is cancer free, and did not need to undergo chemotherapy or radiation. [facility] where she has PT/INR checks, with Coumadin dosing.  She states symptoms were occurring approximately one month ago. She states she has sees periodic symptoms of dyspnea and she knows that her blood counts are dropping. She has had transfusions in the past due to iron deficiency anemia. She cannot tolerate by mouth iron supplements, and has had iron transfusions in the remote past post operatively.   Just before Thanksgiving her breathing worsened and she called her primary care physician he does not have any openings. She was directed to the emergency room or urgent care. She chose to stay home and go through Thanksgiving but 3 days later, she came  to the emergency room at [facility]. She denies active bleeding, she did have some mild chest pressure generalized fatigue, and mild dizziness.  On arrival to the emergency room the blood pressure was 145/44, heart rate 73, O2 sat 100% on room air, she was afebrile. Labs reveal a creatinine of 1.01, glucose 165, all other chemistries within normal limits. Hemoglobin revealed profound anemia with a Hgb 6.5, hematocrit 21.7, PLTS 183. INR 2.54. She was given blood transfusion X 2. Chest x-ray was negative for CHF or pneumonia. EKG NSR rate of 72 bpm.   We are asked for cardiology recommendations for dyspnea, atrial fib, and AoV stenosis, and need to continue Coumadin..    Allergies  Allergen Reactions  . Iodinated Diagnostic Agents Anaphylaxis    IPD dye Info given by patient  . Ioxaglate Anaphylaxis    Info given by patient  . Milk-Related Compounds Anaphylaxis    Lactose intolerance  . Red Dye Anaphylaxis  . Whey     Lactose intolerance  . Darvon [Propoxyphene] Rash  . Hydralazine Anxiety    Facial flushing, pt prefers not to use it.     Medications Scheduled Medications: . amLODipine  10 mg Oral Daily  . cyanocobalamin  1,000 mcg Intramuscular Daily  . gabapentin  100 mg Oral TID  . irbesartan  300 mg Oral Daily  . metoprolol  50 mg Oral BID  . pantoprazole  40 mg Oral BID  . simvastatin  20 mg Oral q1800  . traMADol  50 mg Oral Q8H  . Warfarin - Pharmacist Dosing Inpatient   Does not apply q1800       PRN Medications:  acetaminophen **OR** acetaminophen, ondansetron **OR** ondansetron (ZOFRAN) IV, temazepam  Past Medical History:  Diagnosis Date  . Allergy   . Anemia 2005   generally microcytic, transfusions in [address], 2012, 02/2015, 05/2015  . Arthritis   . Atrial fibrillation ([place])   . Broken back 2013   chronic back pain.   . CHF (congestive heart failure) ([place])   . Esophageal cancer ([place]) 05/06/15   adenocarcinoma ge junction  . GERD (gastroesophageal  reflux disease)   . H/O iron deficiency    05-06-15 iron infusion ([facility])  . HH (hiatus hernia) 2008   large with associated erosions.   . Hypertension   . PONV (postoperative nausea and vomiting)   . Pulmonary emboli ([place]) 2008, 2012  . Stroke ([place])   . Transfusion history    2 units transfused 05-06-15([facility])    Past Surgical History:  Procedure Laterality Date  . APPENDECTOMY  1950s  . CARPAL TUNNEL RELEASE Bilateral 1990s  . [place] SURGERY  2014  . CHOLECYSTECTOMY  1980s   open  . COLONOSCOPY N/A 02/17/2015   Procedure: COLONOSCOPY;  Surgeon: [name], MD;  Location: [facility] ENDOSCOPY;  Service: Endoscopy;  Laterality: N/A;  . ESOPHAGOGASTRODUODENOSCOPY N/A 02/16/2015   Procedure: ESOPHAGOGASTRODUODENOSCOPY (EGD);  Surgeon: [name], MD;  Location: [facility];  Service: Endoscopy;  Laterality: N/A;  . ESOPHAGOGASTRODUODENOSCOPY N/A 05/06/2015   Procedure: ESOPHAGOGASTRODUODENOSCOPY (EGD);  Surgeon: [name], MD;  [facility] END[facility]COPY;  Service: Endoscopy;  Laterality: N/A;  . EUS N/A 05/20/2015   Procedure: UPPER ENDOSCOPIC ULTRASOUND (EUS) LINEAR;  Surgeon: [name], MD;  Location: [facility] ENDOSCOPY;  Service: Endoscopy;  Laterality: N/A;  . exploratory lab  1950s or 1960s  . GIVENS CAPSULE STUDY N/A 05/06/2015   Procedure: GIVENS CAPSULE STUDY;  Surgeon: [name], MD;  Location: [facility] ENDOSCOPY;  Service: Endoscopy;  Laterality: N/A;  . lumbar back surgery  2012  . [place] SURGERY  1991  . TONSILLECTOMY AND ADENOIDECTOMY  1960s    Family History  Problem Relation Age of Onset  . Stroke Mother   . Heart disease Mother   . Emphysema Father   . Ovarian cancer Sister   . Stroke Sister   . Other Child     died at birth     Social History Ms. Slupski reports that she has never smoked. She has never used smokeless tobacco. Ms. Braniff reports that she does not drink alcohol.  Review of Systems Complete review of systems are found to be negative  unless outlined in H&P above.  Physical Examination Blood pressure (!) 134/50, pulse 74, temperature 98 F (36.7 C), temperature source Oral, resp. rate 20, height 5\' 2"  (1.575 m), weight 135 lb (61.2 kg), SpO2 99 %.  Intake/Output Summary (Last 24 hours) at 05/30/16 0835 Last data filed at 05/30/16 0500  Gross per 24 hour  Intake              240 ml  Output             1500 ml  Net            -1260 ml    Telemetry:Telemetry is ordered  [id number], frail, no acute distress  HEENT: Conjunctiva and lids normal, oropharynx clear with moist mucosa. Neck: Supple, no elevated JVP or carotid bruits, no thyromegaly. Lungs: Clear to auscultation, nonlabored breathing at rest. Cardiac: Iregular rate and [id number] systolic murmur, no pericardial rub. Abdomen: Soft, nontender, no hepatomegaly, bowel sounds present, no guarding or rebound. Extremities: No pitting edema, distal pulses  2+. Skin: Warm and dry. Musculoskeletal: No kyphosis. Neuropsychiatric: Alert and oriented x3, affect grossly appropriate.  Prior Cardiac Testing/Procedures Cardiac Cath 2014 Cardiac catheterization which was performed at an outside hospital 2014 which did demonstrate an 80% mid RCA stenosis as well as a 70% OM lesion and some mild to moderate disease of other vessels. At the time no stent was recommended, which leads me to believe these may be smaller vessels.([place]).  Echocardiogram 05/28/2016 Left ventricle: The cavity size was normal. There was mild focal   basal hypertrophy of the septum. Systolic function was vigorous.   The estimated ejection fraction was in the range of 65% to 70%.   Wall motion was normal; there were no regional wall motion   abnormalities. Features are consistent with a pseudonormal left   ventricular filling pattern, with concomitant abnormal relaxation   and increased filling pressure (grade 2 diastolic dysfunction). - Aortic valve: Trileaflet; severely calcified leaflets. There  was   moderate stenosis. There was no significant regurgitation. Mean   gradient (S): 26 mm Hg. Peak gradient (S): 47 mm Hg. VTI ratio of   LVOT to aortic valve: 0.42. Valve area (VTI): 1.07 cm^2. - Mitral valve: Moderately calcified annulus. There was trivial   regurgitation. - Left atrium: The atrium was mildly dilated. - Right atrium: The atrium was mildly dilated. Central venous   pressure (est): 3 mm Hg. - Tricuspid valve: There was trivial regurgitation. - Pulmonary arteries: Systolic pressure could not be accurately   estimated. - Pericardium, extracardiac: There was no pericardial effusion.   Lab Results  Basic Metabolic Panel:  Recent Labs Lab 05/27/16 1502 05/30/16 0452  NA 135 138  K 4.2 3.8  CL 105 103  CO2 23 28  GLUCOSE 165* 145*  BUN 12 18  CREATININE 1.01* 0.88  CALCIUM 8.1* 7.9*    Liver Function Tests:  Recent Labs Lab 05/27/16 1754  AST 17  ALT 10*  ALKPHOS 68  BILITOT 0.4  PROT 5.8*  ALBUMIN 3.2*    CBC:  Recent Labs Lab 05/27/16 1502 05/28/16 0659 05/29/16 0457  WBC 4.5 3.7* 8.0  HGB 6.5* 9.7* 9.0*  HCT 21.7* 30.9* 28.5*  MCV 77.2* 78.4 78.9  PLT 183 161 169    Cardiac Enzymes:  Recent Labs Lab 05/27/16 1502  TROPONINI <0.03    Radiology: Dg Chest Port 1 View  Result Date: 05/29/2016 CLINICAL DATA:  Worsening shortness of breath EXAM: PORTABLE CHEST 1 VIEW COMPARISON:  05/27/2016 FINDINGS: Surgical changes of the cervical spine. Mild cardiomegaly. There is mild central vascular congestion. Mild interstitial prominence suggests mild perihilar edema. No effusion. Atherosclerosis. No pneumothorax. Lower mediastinal opacity suspected to represent hiatal hernia. IMPRESSION: Mild cardiomegaly with mild central vascular congestion and mild diffuse interstitial prominence, suggesting mild edema. Electronically Signed   By: Donavan Foil M.D.   On: 05/29/2016 20:11     [id number] sinus rhythm heart rate 74 bpm, no acute ST-T wave  changes   Impression and Recommendations  1. Paroxysmal atrial fibrillation: Initial EKG revealed normal sinus rhythm, heart rate is irregular on auscultation. I will place a telemetry monitor to evaluate her rhythms. She remains on Coumadin but we may need to reconsider this with frequent blood transfusions, in the absence of active bleeding, but with severe iron deficiency anemia. She remains on metoprolol 50 mg twice a day.  2. Dyspnea: Likely from profound anemia. This has improved with blood transfusions. No evidence of pneumonia or CHF on chest x-ray. Echocardiogram reveals normal  LV systolic function.  3. Aortic valve stenosis: Per echo on 07/29/2015, the aortic valve had severely calcified leaflets with moderate stenosis. There is no significant regurg. Valve area (VTI) 1.07 cm sq. mean gradient 26 mmHg, peak gradient (S): 47 mmHg. VTI ratio of LVOT to aortic valve: 0.42. Doubt aortic valve stenosis is causing dyspnea at this time. Significant anemia with aortic valve stenosis contributes to dyspnea. She is breathing much better after blood transfusion and is on room air. At this time with multiple comorbidities uncertain that she would be a candidate for aortic valve repair. We'll continue to follow as outpatient.  4. Severe Iron Deficiency Anemia: Recommend hematology consultation for options for iron replacement as she is unable to tolerate po iron due to significant nausea.   5. Adenocarcinoma of the colon: S/P robotic resection. Followed by [facility], surgeon. Is due for follow up in January. Has been followed by Dr. Lafayette Dragon, oncologist, in [facility] in the past when she was initially diagnosed.   Signed: Phill Myron. Lawrence NP AACC  05/30/2016, 8:35 AM Co-Sign MD   The patient was seen and examined, and I agree with the history, physical exam, assessment and plan as documented above, with modifications as noted below. She has a very complex history as noted above. I do feel  her exertional dyspnea is multifactorial, and includes anemia, grade 2 diastolic dysfunction/chronic diastolic heart failure, moderate aortic stenosis, and coronary artery disease. She had mild pulmonary vascular congestion yesterday and received IV Lasix, but only feels marginally better.  She should be on Lasix 40 mg twice daily at a minimum. In addition to this, I am adding Imdur 30 mg to reduce left ventricular end diastolic filling pressures (thereby reducing dyspnea) and also for symptomatic relief of potentially small vessel (but obstructive) CAD.  Given her history of multiple pulmonary emboli and paroxysmal atrial fibrillation superimposed on h/o malignancy, wafarin is indicated to reduce thromboembolic risk. Given that she has no active GI bleeding, this should be permissible with routine Hgb monitoring.  Kate Sable, MD, [facility]  05/30/2016 10:47 AM

## 2016-05-30 NOTE — Progress Notes (Signed)
  Subjective:  Patient feels better. She states she is not having dyspnea at rest anymore. She denies abdominal pain. She's not had BM today. She has good appetite.  Objective: Blood pressure (!) 134/50, pulse 63, temperature 98 F (36.7 C), temperature source Oral, resp. rate 20, height 5\' 2"  (1.575 m), weight 135 lb (61.2 kg), SpO2 94 %. Patient appears to be comfortable and reclining chair. She remains pale.  Labs/studies Results:   Recent Labs  05/27/16 1502 05/28/16 0659 05/29/16 0457  WBC 4.5 3.7* 8.0  HGB 6.5* 9.7* 9.0*  HCT 21.7* 30.9* 28.5*  PLT 183 161 169    BMET   Recent Labs  05/27/16 1502 05/30/16 0452  NA 135 138  K 4.2 3.8  CL 105 103  CO2 23 28  GLUCOSE 165* 145*  BUN 12 18  CREATININE 1.01* 0.88  CALCIUM 8.1* 7.9*    LFT   Recent Labs  05/27/16 1754  PROT 5.8*  ALBUMIN 3.2*  AST 17  ALT 10*  ALKPHOS 68  BILITOT 0.4  BILIDIR <0.1*  IBILI NOT CALCULATED    PT/INR   Recent Labs  05/28/16 0659 05/30/16 0954  LABPROT 26.3* 16.2*  INR 2.37 1.29     Assessment:  #1. Iron deficiency anemia.  Patient remains without evidence of overt or occult GI bleed. She has received 2 units of PRBCs during this admission. H&H not done today. Would like for patient to try [place] with iron chewable and if she is not able to tolerate even this dose she will need intermittent parenteral iron infusion. #2. History of gastric cardia adenocarcinoma. Status post endoscopic removal in January 2017. She is due for surveillance EGD in January 2018. #2. Dyspnea. She is breathing better. Cardiology note appreciated. I agree is no contraindication to resuming anticoagulation. #3. Dysphagia to liquids and solids is stable. No further evaluation planned at this time.   Recommendations:  CBC in a.m.

## 2016-05-31 ENCOUNTER — Telehealth: Payer: Self-pay | Admitting: *Deleted

## 2016-05-31 ENCOUNTER — Other Ambulatory Visit (HOSPITAL_COMMUNITY): Payer: Medicare Other

## 2016-05-31 NOTE — Telephone Encounter (Signed)
.  Transition Care Management Follow-up Telephone Call   Date discharged? 05/30/16   How have you been since you were released from the hospital? Pt states she is feeling fine   Do you understand why you were in the hospital? YES   Do you understand the discharge instructions? YES   Where were you discharged to? Home   Items Reviewed:  Medications reviewed: YES  Allergies reviewed: YES  Dietary changes reviewed: NO  Referrals reviewed: NO   Functional Questionnaire:   Activities of Daily Living (ADLs):   She states she are independent in the following: ambulation, bathing and hygiene, feeding, continence, grooming, toileting and dressing States she doesn't require assistance    Any transportation issues/concerns?: NO   Any patient concerns? NO   Confirmed importance and date/time of follow-up visits scheduled YES, appt 06/07/16  Provider Appointment booked with Terri Piedra  Confirmed with patient if condition begins to worsen call PCP or go to the ER.  Patient was given the office number and encouraged to call back with question or concerns.  YES

## 2016-06-07 ENCOUNTER — Other Ambulatory Visit (INDEPENDENT_AMBULATORY_CARE_PROVIDER_SITE_OTHER): Payer: Medicare Other

## 2016-06-07 ENCOUNTER — Encounter: Payer: Self-pay | Admitting: Family

## 2016-06-07 ENCOUNTER — Ambulatory Visit (INDEPENDENT_AMBULATORY_CARE_PROVIDER_SITE_OTHER): Payer: Medicare Other | Admitting: Family

## 2016-06-07 ENCOUNTER — Other Ambulatory Visit: Payer: Self-pay | Admitting: Family

## 2016-06-07 VITALS — BP 158/72 | HR 65 | Temp 98.3°F | Resp 16 | Ht 62.0 in | Wt 135.0 lb

## 2016-06-07 DIAGNOSIS — D500 Iron deficiency anemia secondary to blood loss (chronic): Secondary | ICD-10-CM

## 2016-06-07 DIAGNOSIS — Z7189 Other specified counseling: Secondary | ICD-10-CM | POA: Diagnosis not present

## 2016-06-07 DIAGNOSIS — I5032 Chronic diastolic (congestive) heart failure: Secondary | ICD-10-CM | POA: Diagnosis not present

## 2016-06-07 DIAGNOSIS — K219 Gastro-esophageal reflux disease without esophagitis: Secondary | ICD-10-CM

## 2016-06-07 LAB — CBC
HEMATOCRIT: 34.2 % — AB (ref 36.0–46.0)
Hemoglobin: 11.3 g/dL — ABNORMAL LOW (ref 12.0–15.0)
MCHC: 33 g/dL (ref 30.0–36.0)
MCV: 75.9 fl — ABNORMAL LOW (ref 78.0–100.0)
PLATELETS: 224 10*3/uL (ref 150.0–400.0)
RBC: 4.51 Mil/uL (ref 3.87–5.11)
RDW: 19.5 % — ABNORMAL HIGH (ref 11.5–15.5)
WBC: 6.6 10*3/uL (ref 4.0–10.5)

## 2016-06-07 LAB — COMPREHENSIVE METABOLIC PANEL
ALBUMIN: 3.9 g/dL (ref 3.5–5.2)
ALT: 11 U/L (ref 0–35)
AST: 14 U/L (ref 0–37)
Alkaline Phosphatase: 83 U/L (ref 39–117)
BUN: 16 mg/dL (ref 6–23)
CALCIUM: 9.2 mg/dL (ref 8.4–10.5)
CHLORIDE: 102 meq/L (ref 96–112)
CO2: 35 mEq/L — ABNORMAL HIGH (ref 19–32)
CREATININE: 1.04 mg/dL (ref 0.40–1.20)
GFR: 54.65 mL/min — AB (ref >60.00)
Glucose, Bld: 159 mg/dL — ABNORMAL HIGH (ref 70–99)
POTASSIUM: 4.4 meq/L (ref 3.5–5.1)
Sodium: 141 mEq/L (ref 135–145)
Total Bilirubin: 0.4 mg/dL (ref 0.2–1.2)
Total Protein: 6.7 g/dL (ref 6.0–8.3)

## 2016-06-07 LAB — IBC PANEL
IRON: 186 ug/dL — AB (ref 42–145)
Saturation Ratios: 45.3 % (ref 20.0–50.0)
Transferrin: 293 mg/dL (ref 212.0–360.0)

## 2016-06-07 NOTE — Assessment & Plan Note (Signed)
Gastroesophageal reflux currently managed with pantoprazole twice daily and insufficient symptom management. Previous medication failures with sucralfate and H2 blockers. Recommend GERD diet and follow-up with gastroenterology.

## 2016-06-07 NOTE — Patient Instructions (Addendum)
Thank you for choosing [facility].  SUMMARY AND INSTRUCTIONS:  Medication:  Keep taking your medications as prescribed.   Try magnesium 200-400 mg as needed for cramping.   Your prescription(s) have been submitted to your pharmacy or been printed and provided for you. Please take as directed and contact our office if you believe you are having problem(s) with the medication(s) or have any questions.  Labs:  Please stop by the lab on the lower level of the building for your blood work. Your results will be released to [place] (or called to you) after review, usually within 72 hours after test completion. If any changes need to be made, you will be notified at that same time.  1.) The lab is open from 7:30am to 5:30 pm Monday-Friday 2.) No appointment is necessary 3.) Fasting (if needed) is 6-8 hours after food and drink; black coffee and water are okay   Follow up:  If your symptoms worsen or fail to improve, please contact our office for further instruction, or in case of emergency go directly to the emergency room at the closest medical facility.    Recommended mediation regimen:  Daily medications around 10 am.  Furosemide Gabapentin Isosorbide mononitrate Pantoprazole Valsartan Magnesium   Mid-day  Gabpentin  Evening medications between 6-8 pm.  Simvastain, Temazpeam, and Coumadin, Pantoprazole (30 minutes before other medications)

## 2016-06-07 NOTE — Progress Notes (Signed)
Subjective:    Patient ID: [name], female    DOB: 08-Sep-1940, 75 y.o.   MRN: [id number]  Chief Complaint  Patient presents with  . Hospitalization Follow-up    still having acid reflux problems     HPI:  [name] is a 75 y.o. female who  has a past medical history of Allergy; Anemia (2005); Arthritis; Atrial fibrillation ([place]); Broken back (2013); CHF (congestive heart failure) ([place]); Esophageal cancer ([place]) (05/06/15); GERD (gastroesophageal reflux disease); H/O iron deficiency; HH (hiatus hernia) (2008); Hypertension; PONV (postoperative nausea and vomiting); Pulmonary emboli ([place]) (2008, 2012); Stroke [facility]); and Transfusion history. and presents today for an office visit.   Recently evaluated in the emergency department and admitted to the hospital with a cough and labored breathing. Hemoglobin was noted to be 6.5 with hematocrit of 21.7.EKG showed normal sinus rhythm.she was transfused 2 units of packed red blood cells.her shortness of breath was resolved and was most likely related to her profound anemiaand iron deficiency. She was started on Flintstones vitamins with iron with inability to tolerate oral iron. She remains on Coumadin for her atrial fibrillation and Protonix for gastroesophageal reflux. Hospitalist recommends follow-up basic metabolic profile in 1-2 weeks and continued B12 supplementation and outpatient physical therapy.All hospital records, labs, and imaging reviewed in detail.  Since leaving the hospital she reports that her shortness of breath has improved however continues to experience the associated symptoms of acid reflux for which she currently takes pantoprazole twice daily as prescribed and denies adverse side effects. Notes that her symptoms are inadequately controlled. She has tried  H2 blockers as well as Carafate in the past with little success. Denies abdominal pain, nausea, vomiting, or melena. Reports taking the Flintstones vitamins as prescribed  and denies adverse side effects. Expresses some confusion with her current medication regimen and when to take her medications.  Allergies  Allergen Reactions  . Iodinated Diagnostic Agents Anaphylaxis    IPD dye Info given by patient  . Ioxaglate Anaphylaxis    Info given by patient  . Milk-Related Compounds Anaphylaxis    Lactose intolerance  . Red Dye Anaphylaxis  . Whey     Lactose intolerance  . Darvon [Propoxyphene] Rash  . Hydralazine Anxiety    Facial flushing, pt prefers not to use it.       Outpatient Medications Prior to Visit  Medication Sig Dispense Refill  . acetaminophen (TYLENOL) 500 MG tablet Take 500-1,000 mg by mouth daily as needed for mild pain or moderate pain.     . cloNIDine (CATAPRES) 0.1 MG tablet Take 1 tablet (0.1 mg total) by mouth daily as needed (high blood pressure, will take if levels are 180/90). (Patient taking differently: Take 0.1 mg by mouth daily as needed (high blood pressure, take if blood pressure is >179/99.). ) 30 tablet 0  . cyanocobalamin (,VITAMIN B-12,) 1000 MCG/ML injection IM daily 11/29-12/3, then weekly x4, then monthly. 9 mL 0  . furosemide (LASIX) 40 MG tablet Take 1 tablet (40 mg total) by mouth daily. 30 tablet 0  . gabapentin (NEURONTIN) 100 MG capsule TAKE 1 CAPSULE BY MOUTH THREE TIMES A DAY. 90 capsule 3  . isosorbide mononitrate (IMDUR) 30 MG 24 hr tablet Take 1 tablet (30 mg total) by mouth daily. 30 tablet 0  . meclizine (ANTIVERT) 25 MG tablet Take 1 tablet (25 mg total) by mouth 3 (three) times daily as needed for dizziness. 30 tablet 0  . NONFORMULARY OR COMPOUNDED ITEM Diclofenac/Baclofen/Cyclobenzaprine/Gabapentin/Lidocaine  3/2/2/6/2.5% Cream  Apply to affected area 2-3 times per day as needed for arthritic pain. (Patient taking differently: Apply 1 application topically daily as needed (for arthritis). Diclofenac/Baclofen/Cyclobenzaprine/Gabapentin/Lidocaine 3/2/2/6/2.5% Cream  Apply to affected area 2-3 times per  day as needed for arthritic pain.) 1 each 1  . ondansetron (ZOFRAN) 4 MG tablet Take 4 mg by mouth every 8 (eight) hours as needed for nausea or vomiting.    . Pediatric Multivitamins-Iron (FLINTSTONES PLUS IRON) chewable tablet Chew 2 tablets by mouth daily. 60 tablet 0  . temazepam (RESTORIL) 15 MG capsule TAKE 1 CAPSULE BY MOUTH AT BEDTIME. 30 capsule 2  . traMADol (ULTRAM) 50 MG tablet Take 1 tablet (50 mg total) by mouth every 8 (eight) hours. 90 tablet 0  . valsartan (DIOVAN) 320 MG tablet TAKE 1/2 TABLET BY MOUTH ONCE DAILY. 45 tablet 2  . amoxicillin (AMOXIL) 500 MG tablet Take 2 tablets (1,000 mg total) by mouth 2 (two) times daily. (Patient not taking: Reported on 05/27/2016) 40 tablet 0  . clarithromycin (BIAXIN) 500 MG tablet Take 1 tablet (500 mg total) by mouth 2 (two) times daily. (Patient not taking: Reported on 05/27/2016) 20 tablet 0  . metoprolol (LOPRESSOR) 50 MG tablet TAKE 1 TABLET BY MOUTH TWICE DAILY. 180 tablet 1  . pantoprazole (PROTONIX) 40 MG tablet TAKE 1 TABLET BY MOUTH TWICE DAILY. 60 tablet 0  . simvastatin (ZOCOR) 20 MG tablet TAKE (1) TABLET BY MOUTH AT BEDTIME. 30 tablet 0  . warfarin (COUMADIN) 5 MG tablet TAKE 1/2 TABLET BY MOUTH ON WEDNESDAY AND 1 TABLET ALL OTHER DAYS. (Patient taking differently: TAKE 1/2 TABLET BY MOUTH ON WEDNESDAY AND 1 TABLET ALL OTHER DAYS. TAKES IN THE EVENING) 90 tablet 0   No facility-administered medications prior to visit.       Past Surgical History:  Procedure Laterality Date  . APPENDECTOMY  1950s  . CARPAL TUNNEL RELEASE Bilateral 1990s  . [place] SURGERY  2014  . CHOLECYSTECTOMY  1980s   open  . COLONOSCOPY N/A 02/17/2015   Procedure: COLONOSCOPY;  Surgeon: [name], MD;  Location: [facility] ENDOSCOPY;  Service: Endoscopy;  Laterality: N/A;  . ESOPHAGOGASTRODUODENOSCOPY N/A 02/16/2015   Procedure: ESOPHAGOGASTRODUODENOSCOPY (EGD);  Surgeon: [name], MD;  Location: [facility] ENDOSCOPY;  Service: Endoscopy;   Laterality: N/A;  . ESOPHAGOGASTRODUODENOSCOPY N/A 05/06/2015   Procedure: ESOPHAGOGASTRODUODENOSCOPY (EGD);  Surgeon: [name], MD;  Location: [facility] ENDOSCOPY;  Service: Endoscopy;  Laterality: N/A;  . EUS N/A 05/20/2015   Procedure: UPPER ENDOSCOPIC ULTRASOUND (EUS) LINEAR;  Surgeon: [name], MD;  Location: [facility] ENDOSCOPY;  Service: Endoscopy;  Laterality: N/A;  . exploratory lab  1950s or 1960s  . GIVENS CAPSULE STUDY N/A 05/06/2015   Procedure: GIVENS CAPSULE STUDY;  Surgeon: [name], MD;  Location: [facility] ENDOSCOPY;  Service: Endoscopy;  Laterality: N/A;  . lumbar back surgery  2012  . [place] SURGERY  1991  . TONSILLECTOMY AND ADENOIDECTOMY  1960s      Past Medical History:  Diagnosis Date  . Allergy   . Anemia 2005   generally microcytic, transfusions in [address], 2012, 02/2015, 05/2015  . Arthritis   . Atrial fibrillation ([place])   . Broken back 2013   chronic back pain.   . CHF (congestive heart failure) ([place])   . Esophageal cancer ([place]) 05/06/15   adenocarcinoma ge junction  . GERD (gastroesophageal reflux disease)   . H/O iron deficiency    05-06-15 iron infusion ([facility])  .  HH (hiatus hernia) 2008   large with associated erosions.   . Hypertension   . PONV (postoperative nausea and vomiting)   . Pulmonary emboli ([place]) 2008, 2012  . Stroke ([place])   . Transfusion history    2 units transfused 05-06-15([facility])      Review of Systems  Constitutional: Negative for chills and fever.  Respiratory: Negative for chest tightness, shortness of breath and wheezing.   Gastrointestinal: Negative for abdominal pain, blood in stool, constipation, diarrhea, nausea, rectal pain and vomiting.      Objective:    BP (!) 158/72 (BP Location: Left Arm, Patient Position: Sitting, Cuff Size: Normal)   Pulse 65   Temp 98.3 F (36.8 C) (Oral)   Resp 16   Ht 5\' 2"  (1.575 m)   Wt 135 lb (61.2 kg)   SpO2 96%   BMI 24.69 kg/m  Nursing note and vital signs reviewed.  Physical  Exam  Constitutional: She is oriented to person, place, and time. She appears well-developed and well-nourished. No distress.  Cardiovascular: Normal rate, regular rhythm and intact distal pulses.   Murmur heard. Pulmonary/Chest: Effort normal and breath sounds normal.  Neurological: She is alert and oriented to person, place, and time.  Skin: Skin is warm and dry.  Psychiatric: She has a normal mood and affect. Her behavior is normal. Judgment and thought content normal.       Assessment & Plan:   Problem List Items Addressed This Visit      Cardiovascular and Mediastinum   Chronic diastolic congestive heart failure ([place])   Relevant Orders   AMB Referral to [facility]     Digestive   GERD (gastroesophageal reflux disease)    Gastroesophageal reflux currently managed with pantoprazole twice daily and insufficient symptom management. Previous medication failures with sucralfate and H2 blockers. Recommend GERD diet and follow-up with gastroenterology.        Other   Anemia - Primary    Iron deficiency anemia and maintained on Flintstones vitamins. Obtain CBC, IBC panel, and complete metabolic profile. If iron remains low consider possible iron infusions given failed treatments with oral supplementation.      Relevant Orders   Comprehensive metabolic panel (Completed)   CBC (Completed)   IBC panel (Completed)   Encounter for medication review and counseling    Medication reviewed with patient with suggestions on medication administration times including the use of a pillbox or other organization techniques. Patient seems more comfortable with medication regimen following counseling.          I have discontinued Ms. Hakeem's clarithromycin and amoxicillin. I am also having her maintain her meclizine, NONFORMULARY OR COMPOUNDED ITEM, cloNIDine, ondansetron, acetaminophen, valsartan, traMADol, gabapentin, temazepam, cyanocobalamin, furosemide, isosorbide mononitrate, and  FLINTSTONES PLUS IRON.   Follow-up: Return in about 1 month (around 07/08/2016), or if symptoms worsen or fail to improve.  Mauricio Po, [name]

## 2016-06-07 NOTE — Assessment & Plan Note (Signed)
Iron deficiency anemia and maintained on Flintstones vitamins. Obtain CBC, IBC panel, and complete metabolic profile. If iron remains low consider possible iron infusions given failed treatments with oral supplementation.

## 2016-06-07 NOTE — Assessment & Plan Note (Signed)
Medication reviewed with patient with suggestions on medication administration times including the use of a pillbox or other organization techniques. Patient seems more comfortable with medication regimen following counseling.

## 2016-06-08 ENCOUNTER — Encounter: Payer: Self-pay | Admitting: Family

## 2016-06-15 ENCOUNTER — Other Ambulatory Visit: Payer: Self-pay | Admitting: Pharmacist

## 2016-06-15 NOTE — Patient Outreach (Signed)
[place] [facility]) Care Management  06/15/2016  LETEISHA PULLER 24-Feb-1941 [id number]  Patient was referred to [place] by PCP for "medication assistance/monitoring."  Unsuccessful phone outreach attempt to patient.  HIPAA compliant message left requesting return call.   Plan:  Will make another outreach attempt to patient in the next week if no return call.   Karrie Meres, PharmD, [place] [phone]

## 2016-06-18 ENCOUNTER — Emergency Department (HOSPITAL_COMMUNITY)
Admission: EM | Admit: 2016-06-18 | Discharge: 2016-06-18 | Disposition: A | Discharge status: Home / Self Care | Payer: Medicare Other | Attending: Emergency Medicine | Admitting: Emergency Medicine

## 2016-06-18 ENCOUNTER — Emergency Department (HOSPITAL_COMMUNITY): Payer: Medicare Other

## 2016-06-18 ENCOUNTER — Encounter (HOSPITAL_COMMUNITY): Payer: Self-pay | Admitting: Cardiology

## 2016-06-18 DIAGNOSIS — I5032 Chronic diastolic (congestive) heart failure: Secondary | ICD-10-CM | POA: Insufficient documentation

## 2016-06-18 DIAGNOSIS — Z8501 Personal history of malignant neoplasm of esophagus: Secondary | ICD-10-CM | POA: Diagnosis not present

## 2016-06-18 DIAGNOSIS — R0602 Shortness of breath: Secondary | ICD-10-CM | POA: Diagnosis not present

## 2016-06-18 DIAGNOSIS — Z79899 Other long term (current) drug therapy: Secondary | ICD-10-CM | POA: Insufficient documentation

## 2016-06-18 DIAGNOSIS — Z7901 Long term (current) use of anticoagulants: Secondary | ICD-10-CM | POA: Diagnosis not present

## 2016-06-18 DIAGNOSIS — I110 Hypertensive heart disease with heart failure: Secondary | ICD-10-CM | POA: Insufficient documentation

## 2016-06-18 DIAGNOSIS — R062 Wheezing: Secondary | ICD-10-CM

## 2016-06-18 DIAGNOSIS — R079 Chest pain, unspecified: Secondary | ICD-10-CM | POA: Diagnosis not present

## 2016-06-18 DIAGNOSIS — J40 Bronchitis, not specified as acute or chronic: Secondary | ICD-10-CM | POA: Insufficient documentation

## 2016-06-18 LAB — BASIC METABOLIC PANEL
Anion gap: 8 (ref 5–15)
BUN: 19 mg/dL (ref 6–20)
CALCIUM: 8.8 mg/dL — AB (ref 8.9–10.3)
CO2: 30 mmol/L (ref 22–32)
CREATININE: 1.05 mg/dL — AB (ref 0.44–1.00)
Chloride: 101 mmol/L (ref 101–111)
GFR calc Af Amer: 58 mL/min — ABNORMAL LOW (ref >60)
GFR, EST NON AFRICAN AMERICAN: 50 mL/min — AB (ref >60)
GLUCOSE: 183 mg/dL — AB (ref 65–99)
Potassium: 3.8 mmol/L (ref 3.5–5.1)
Sodium: 139 mmol/L (ref 135–145)

## 2016-06-18 LAB — CBC
HCT: 34.8 % — ABNORMAL LOW (ref 36.0–46.0)
Hemoglobin: 10.9 g/dL — ABNORMAL LOW (ref 12.0–15.0)
MCH: 25.4 pg — AB (ref 26.0–34.0)
MCHC: 31.3 g/dL (ref 30.0–36.0)
MCV: 81.1 fL (ref 78.0–100.0)
Platelets: 192 10*3/uL (ref 150–400)
RBC: 4.29 MIL/uL (ref 3.87–5.11)
RDW: 17.3 % — AB (ref 11.5–15.5)
WBC: 7 10*3/uL (ref 4.0–10.5)

## 2016-06-18 LAB — TROPONIN I

## 2016-06-18 MED ORDER — ALBUTEROL SULFATE HFA 108 (90 BASE) MCG/ACT IN AERS: 2.0000 | INHALATION_SPRAY | RESPIRATORY_TRACT | 1 refills | Status: DC | PRN

## 2016-06-18 MED ORDER — PREDNISONE 20 MG PO TABS
40.0000 mg | ORAL_TABLET | Freq: Once | ORAL | Status: AC
  Administered 2016-06-18: 40 mg via ORAL
  Filled 2016-06-18: qty 2

## 2016-06-18 MED ORDER — IPRATROPIUM-ALBUTEROL 0.5-2.5 (3) MG/3ML IN SOLN
RESPIRATORY_TRACT | Status: AC
  Administered 2016-06-18: 3 mL via RESPIRATORY_TRACT
  Filled 2016-06-18: qty 3

## 2016-06-18 MED ORDER — ALBUTEROL SULFATE (2.5 MG/3ML) 0.083% IN NEBU
2.5000 mg | INHALATION_SOLUTION | Freq: Once | RESPIRATORY_TRACT | Status: AC
  Administered 2016-06-18: 2.5 mg via RESPIRATORY_TRACT

## 2016-06-18 MED ORDER — DOXYCYCLINE HYCLATE 100 MG PO CAPS: 100.0000 mg | ORAL_CAPSULE | Freq: Two times a day (BID) | ORAL | 0 refills | Status: DC

## 2016-06-18 MED ORDER — ALBUTEROL SULFATE (2.5 MG/3ML) 0.083% IN NEBU: 5.0000 mg | INHALATION_SOLUTION | Freq: Once | RESPIRATORY_TRACT | Status: DC

## 2016-06-18 MED ORDER — PREDNISONE 20 MG PO TABS: 40.0000 mg | ORAL_TABLET | Freq: Every day | ORAL | 0 refills | Status: DC

## 2016-06-18 MED ORDER — IPRATROPIUM BROMIDE 0.02 % IN SOLN: 0.5000 mg | Freq: Once | RESPIRATORY_TRACT | Status: DC

## 2016-06-18 MED ORDER — TRAMADOL HCL 50 MG PO TABS
50.0000 mg | ORAL_TABLET | Freq: Once | ORAL | Status: AC
  Administered 2016-06-18: 50 mg via ORAL
  Filled 2016-06-18: qty 1

## 2016-06-18 MED ORDER — ALBUTEROL SULFATE (2.5 MG/3ML) 0.083% IN NEBU
5.0000 mg | INHALATION_SOLUTION | Freq: Once | RESPIRATORY_TRACT | Status: AC
  Administered 2016-06-18: 5 mg via RESPIRATORY_TRACT
  Filled 2016-06-18: qty 6

## 2016-06-18 MED ORDER — ALBUTEROL SULFATE (2.5 MG/3ML) 0.083% IN NEBU
INHALATION_SOLUTION | RESPIRATORY_TRACT | Status: AC
  Administered 2016-06-18: 2.5 mg via RESPIRATORY_TRACT
  Filled 2016-06-18: qty 3

## 2016-06-18 MED ORDER — IPRATROPIUM-ALBUTEROL 0.5-2.5 (3) MG/3ML IN SOLN
3.0000 mL | Freq: Once | RESPIRATORY_TRACT | Status: AC
  Administered 2016-06-18: 3 mL via RESPIRATORY_TRACT

## 2016-06-18 NOTE — ED Provider Notes (Signed)
[facility] DEPT Provider Note   CSN: [id number] Arrival date & time: 06/18/16  0820     History   Chief Complaint Chief Complaint  Patient presents with  . Shortness of Breath    HPI ANALYCE [name] is a 75 y.o. female.  Patient c/o cough, congestion, and so in past 1-2 days. Symptoms moderate, persistent. No sore throat. Denies fever/chills. Mild chest soreness with coughing episodes, otherwise denies any exertional or episodic chest pain. No leg pain or swelling. Compliant w normal meds. No orthopnea or pnd. Pt is wheezing. Denies hx asthma or copd. Non smoker.   The history is provided by the patient.  Shortness of Breath  Associated symptoms include cough and wheezing. Pertinent negatives include no fever, no headaches, no sore throat, no neck pain, no abdominal pain, no rash and no leg swelling.    Past Medical History:  Diagnosis Date  . Allergy   . Anemia 2005   generally microcytic, transfusions in [address], 2012, 02/2015, 05/2015  . Arthritis   . Atrial fibrillation ([place])   . Broken back 2013   chronic back pain.   . CHF (congestive heart failure) ([place])   . Esophageal cancer ([place]) 05/06/15   adenocarcinoma ge junction  . GERD (gastroesophageal reflux disease)   . H/O iron deficiency    05-06-15 iron infusion ([facility])  . HH (hiatus hernia) 2008   large with associated erosions.   . Hypertension   . PONV (postoperative nausea and vomiting)   . Pulmonary emboli ([place]) 2008, 2012  . Stroke ([place])   . Transfusion history    2 units transfused 05-06-15([facility])    Patient Active Problem List   Diagnosis Date Noted  . Encounter for medication review and counseling 06/07/2016  . Anemia 05/28/2016  . Essential hypertension, benign 04/19/2016  . Mild aortic stenosis 09/28/2015  . Chest pain at rest 09/05/2015  . 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 [place] lesion, acute   . IDA (iron deficiency anemia)   . Occult GI bleeding   . Nausea vomiting and diarrhea  05/05/2015  . Symptomatic anemia 05/05/2015  . HLD (hyperlipidemia) 05/05/2015  . Abdominal pain 05/05/2015  . GERD (gastroesophageal reflux disease) 05/05/2015  . Absolute anemia   . Anticoagulated on Coumadin   . Osteoarthritis 04/16/2015  . History of pulmonary embolism 03/02/2015  . Shortness of breath 02/22/2015  . Benign neoplasm of descending colon 02/17/2015  . Diverticulosis of large intestine without diverticulitis 02/17/2015  . Esophageal stricture 02/16/2015  . UGI bleed 02/13/2015  . Supratherapeutic INR 02/13/2015  . Encounter for therapeutic drug monitoring 10/15/2014  . Thoracic back pain 10/15/2014  . Macular degeneration 09/01/2014  . Pneumonia 08/27/2014  . Benign paroxysmal positional vertigo   . Hypertension 08/24/2014  . Back pain 08/03/2014  . Complaints of total body pain 08/03/2014  . Sleep disturbance 08/03/2014  . Aortic stenosis 03/05/2014  . PAF (paroxysmal atrial fibrillation) ([place]) 01/21/2014  . Long term current use of anticoagulant therapy 01/21/2014  . Chronic diastolic congestive heart failure ([place]) 01/21/2014  . Chest pain 01/21/2014    Past Surgical History:  Procedure Laterality Date  . APPENDECTOMY  1950s  . CARPAL TUNNEL RELEASE Bilateral 1990s  . [place] SURGERY  2014  . CHOLECYSTECTOMY  1980s   open  . COLONOSCOPY N/A 02/17/2015   Procedure: COLONOSCOPY;  Surgeon: [name], MD;  Location: [facility] ENDOSCOPY;  Service: Endoscopy;  Laterality: N/A;  . ESOPHAGOGASTRODUODENOSCOPY N/A 02/16/2015   Procedure: ESOPHAGOGASTRODUODENOSCOPY (EGD);  Surgeon: [name], MD;  Location: [facility] ENDOSCOPY;  Service: Endoscopy;  Laterality: N/A;  . ESOPHAGOGASTRODUODENOSCOPY N/A 05/06/2015   Procedure: ESOPHAGOGASTRODUODENOSCOPY (EGD);  Surgeon: [name], MD;  Location: [facility] ENDOSCOPY;  Service: Endoscopy;  Laterality: N/A;  . EUS N/A 05/20/2015   Procedure: UPPER ENDOSCOPIC ULTRASOUND (EUS) LINEAR;  Surgeon: [name], MD;  Location:  [facility] ENDOSCOPY;  Service: Endoscopy;  Laterality: N/A;  . exploratory lab  1950s or 1960s  . GIVENS CAPSULE STUDY N/A 05/06/2015   Procedure: GIVENS CAPSULE STUDY;  Surgeon: [name], MD;  Location[facility] ENDOSCOPY;  Service: Endoscopy;  Laterality: N/A;  . lumbar back surgery  2012  . [place] SURGERY  1991  . TONSILLECTOMY AND ADENOIDECTOMY  1960s    OB History    No data available       Home Medications    Prior to Admission medications   Medication Sig Start Date End Date Taking? Authorizing Provider  acetaminophen (TYLENOL) 500 MG tablet Take 500-1,000 mg by mouth daily as needed for mild pain or moderate pain.     Historical Provider, MD  amLODipine (NORVASC) 10 MG tablet TAKE ONE TABLET BY MOUTH ONCE DAILY. 06/07/16   Golden Circle, [name]  cloNIDine (CATAPRES) 0.1 MG tablet Take 1 tablet (0.1 mg total) by mouth daily as needed (high blood pressure, will take if levels are 180/90). Patient taking differently: Take 0.1 mg by mouth daily as needed (high blood pressure, take if blood pressure is >179/99.).  05/08/15   Geradine Girt, DO  cyanocobalamin (,VITAMIN B-12,) 1000 MCG/ML injection IM daily 11/29-12/3, then weekly x4, then monthly. 05/30/16   Samuella Cota, MD  furosemide (LASIX) 40 MG tablet Take 1 tablet (40 mg total) by mouth daily. 05/30/16   Samuella Cota, MD  gabapentin (NEURONTIN) 100 MG capsule TAKE 1 CAPSULE BY MOUTH THREE TIMES A DAY. 02/22/16   Golden Circle, [name]  isosorbide mononitrate (IMDUR) 30 MG 24 hr tablet Take 1 tablet (30 mg total) by mouth daily. 05/31/16   Samuella Cota, MD  meclizine (ANTIVERT) 25 MG tablet Take 1 tablet (25 mg total) by mouth 3 (three) times daily as needed for dizziness. 08/28/14   Bonnielee Haff, MD  metoprolol (LOPRESSOR) 50 MG tablet TAKE 1 TABLET BY MOUTH TWICE DAILY. 06/07/16   Golden Circle, [name]  NONFORMULARY OR COMPOUNDED ITEM Diclofenac/Baclofen/Cyclobenzaprine/Gabapentin/Lidocaine 3/2/2/6/2.5% Cream  Apply to  affected area 2-3 times per day as needed for arthritic pain. Patient taking differently: Apply 1 application topically daily as needed (for arthritis). Diclofenac/Baclofen/Cyclobenzaprine/Gabapentin/Lidocaine 3/2/2/6/2.5% Cream  Apply to affected area 2-3 times per day as needed for arthritic pain. 04/16/15   Golden Circle, [name]  ondansetron (ZOFRAN) 4 MG tablet Take 4 mg by mouth every 8 (eight) hours as needed for nausea or vomiting.    Historical Provider, MD  pantoprazole (PROTONIX) 40 MG tablet TAKE 1 TABLET BY MOUTH TWICE DAILY. 06/07/16   Golden Circle, [name]  Pediatric Multivitamins-Iron (FLINTSTONES PLUS IRON) chewable tablet Chew 2 tablets by mouth daily. 05/30/16   Samuella Cota, MD  simvastatin (ZOCOR) 20 MG tablet TAKE (1) TABLET BY MOUTH AT BEDTIME. 06/07/16   Golden Circle, [name]  temazepam (RESTORIL) 15 MG capsule TAKE 1 CAPSULE BY MOUTH AT BEDTIME. 03/31/16   Golden Circle, [name]  traMADol (ULTRAM) 50 MG tablet Take 1 tablet (50 mg total) by mouth every 8 (eight) hours. 01/24/16   Golden Circle, [name]  valsartan (  DIOVAN) 320 MG tablet TAKE 1/2 TABLET BY MOUTH ONCE DAILY. 12/16/15   Golden Circle, [name]  warfarin (COUMADIN) 5 MG tablet TAKE 1/2 TABLET BY MOUTH ON WEDNESDAY AND 1 TABLET ALL OTHER DAYS. 06/07/16   Golden Circle, [name]    Family History Family History  Problem Relation Age of Onset  . Stroke Mother   . Heart disease Mother   . Emphysema Father   . Ovarian cancer Sister   . Stroke Sister   . Other Child     died at birth    Social History Social History  Substance Use Topics  . Smoking status: Never Smoker  . Smokeless tobacco: Never Used  . Alcohol use No     Allergies   Iodinated diagnostic agents; Ioxaglate; Milk-related compounds; Red dye; Whey; Darvon [propoxyphene]; and Hydralazine   Review of Systems Review of Systems  Constitutional: Negative for chills and fever.  HENT: Positive for congestion. Negative for sore throat.   Eyes:  Negative for redness.  Respiratory: Positive for cough, shortness of breath and wheezing.   Cardiovascular: Negative for leg swelling.  Gastrointestinal: Negative for abdominal pain.  Genitourinary: Negative for flank pain.  Musculoskeletal: Negative for back pain and neck pain.  Skin: Negative for rash.  Neurological: Negative for headaches.  Hematological: Does not bruise/bleed easily.  Psychiatric/Behavioral: Negative for confusion.     Physical Exam Updated Vital Signs BP (!) 203/66   Pulse 63   Temp 98 F (36.7 C) (Oral)   Resp 16   Ht 5\' 2"  (1.575 m)   Wt 64.4 kg   SpO2 97%   BMI 25.97 kg/m   Physical Exam  Constitutional: She appears well-developed and well-nourished. No distress.  HENT:  Mouth/Throat: Oropharynx is clear and moist.  Eyes: Conjunctivae are normal. No scleral icterus.  Neck: Neck supple. No tracheal deviation present.  Cardiovascular: Normal rate, regular rhythm, normal heart sounds and intact distal pulses.  Exam reveals no gallop and no friction rub.   No murmur heard. Pulmonary/Chest: Effort normal. No respiratory distress. She has wheezes.  Abdominal: Soft. Normal appearance. She exhibits no distension. There is no tenderness.  Musculoskeletal: She exhibits no edema or tenderness.  Neurological: She is alert.  Skin: Skin is warm and dry. No rash noted. She is not diaphoretic.  Psychiatric: She has a normal mood and affect.  Nursing note and vitals reviewed.    ED Treatments / Results  Labs (all labs ordered are listed, but only abnormal results are displayed) Results for orders placed or performed during the hospital encounter of [id number]  Basic metabolic panel  Result Value Ref Range   Sodium 139 135 - 145 mmol/L   Potassium 3.8 3.5 - 5.1 mmol/L   Chloride 101 101 - 111 mmol/L   CO2 30 22 - 32 mmol/L   Glucose, Bld 183 (H) 65 - 99 mg/dL   BUN 19 6 - 20 mg/dL   Creatinine, Ser 1.05 (H) 0.44 - 1.00 mg/dL   Calcium 8.8 (L) 8.9 - 10.3  mg/dL   GFR calc non Af Amer 50 (L) >60 mL/min   GFR calc Af Amer 58 (L) >60 mL/min   Anion gap 8 5 - 15  CBC  Result Value Ref Range   WBC 7.0 4.0 - 10.5 K/uL   RBC 4.29 3.87 - 5.11 MIL/uL   Hemoglobin 10.9 (L) 12.0 - 15.0 g/dL   HCT 34.8 (L) 36.0 - 46.0 %   MCV 81.1 78.0 - 100.0 fL  MCH 25.4 (L) 26.0 - 34.0 pg   MCHC 31.3 30.0 - 36.0 g/dL   RDW 17.3 (H) 11.5 - 15.5 %   Platelets 192 150 - 400 K/uL  Troponin I  Result Value Ref Range   Troponin I <0.03 <0.03 ng/mL   Dg Chest 2 View  Result Date: 05/27/2016 CLINICAL DATA:  75 year old female with a history of chest pain and shortness of breath EXAM: CHEST  2 VIEW COMPARISON:  04/19/2016, 09/05/2015, CT chest 05/07/2015 FINDINGS: Cardiomediastinal silhouette unchanged. Double density over the lower mediastinum is unchanged. Calcifications of the aortic arch. Blunting of the sulcus on the lateral view is unchanged. No pneumothorax.  No confluent airspace disease. Surgical changes of the cervical region. No displaced fracture. Degenerative changes of the thoracic spine. IMPRESSION: Chronic lung changes without evidence of acute cardiopulmonary disease. Re- demonstration of large hiatal hernia. Aortic atherosclerosis. Signed, Dulcy Fanny. Earleen Newport, DO [facility] Radiology Electronically Signed   By: Corrie Mckusick D.O.   On: 05/27/2016 15:17   Dg Chest Portable 1 View  Result Date: 06/18/2016 CLINICAL DATA:  Shortness of breath, chest pain EXAM: PORTABLE CHEST 1 VIEW COMPARISON:  05/29/2016 FINDINGS: Heart is borderline in size. No edema. Minimal right base atelectasis. No effusions or acute bony abnormality. IMPRESSION: Minimal right base atelectasis. Electronically Signed   By: Rolm Baptise M.D.   On: 06/18/2016 09:04   Dg Chest Port 1 View  Result Date: 05/29/2016 CLINICAL DATA:  Worsening shortness of breath EXAM: PORTABLE CHEST 1 VIEW COMPARISON:  05/27/2016 FINDINGS: Surgical changes of  the cervical spine. Mild cardiomegaly. There is mild central vascular congestion. Mild interstitial prominence suggests mild perihilar edema. No effusion. Atherosclerosis. No pneumothorax. Lower mediastinal opacity suspected to represent hiatal hernia. IMPRESSION: Mild cardiomegaly with mild central vascular congestion and mild diffuse interstitial prominence, suggesting mild edema. Electronically Signed   By: Donavan Foil M.D.   On: 05/29/2016 20:11    EKG  EKG Interpretation  Date/Time:  Sunday June 18 2016 08:29:34 EST Ventricular Rate:  66 PR Interval:    QRS Duration: 80 QT Interval:  413 QTC Calculation: 433 R Axis:   39 Text Interpretation:  Sinus rhythm No significant change since last tracing Confirmed by Ashok Cordia  MD, Lennette Bihari ([id number]) on 06/18/2016 9:08:34 AM       Radiology Dg Chest Portable 1 View  Result Date: 06/18/2016 CLINICAL DATA:  Shortness of breath, chest pain EXAM: PORTABLE CHEST 1 VIEW COMPARISON:  05/29/2016 FINDINGS: Heart is borderline in size. No edema. Minimal right base atelectasis. No effusions or acute bony abnormality. IMPRESSION: Minimal right base atelectasis. Electronically Signed   By: Rolm Baptise M.D.   On: 06/18/2016 09:04    Procedures Procedures (including critical care time)  Medications Ordered in ED Medications  albuterol (PROVENTIL) (2.5 MG/3ML) 0.083% nebulizer solution 5 mg (not administered)  ipratropium (ATROVENT) nebulizer solution 0.5 mg (not administered)     Initial Impression / Assessment and Plan / ED Course  I have reviewed the triage vital signs and the nursing notes.  Pertinent labs & imaging results that were available during my care of the patient were reviewed by me and considered in my medical decision making (see chart for details).  Clinical Course    Cxr. Labs.   Albuterol and atrovent neb tx.   Reviewed nursing notes and prior charts for additional history.  cxr no infiltrate. Congestion/rhonchi on exam.   Will rx doxy.    Persistent wheezing on exam.  Alb neb. pred po.  Recheck, moving air well, sl wheeze. No increased wob.  Patient currently appears stable for d/c.     Final Clinical Impressions(s) / ED Diagnoses   Final diagnoses:  None    New Prescriptions New Prescriptions   No medications on file     Lajean Saver, MD 06/18/16 1019

## 2016-06-18 NOTE — ED Notes (Signed)
Pt holding head when doing hourly rounding.  C/o headache 6/10.  Asking for tramadol  for pain.

## 2016-06-18 NOTE — ED Triage Notes (Signed)
Sob since midnight.  Chest pressure started this morning .  Rates 4/10

## 2016-06-18 NOTE — ED Notes (Signed)
Pt also c/o 7 lb weight gain.  C/o lower extremity edema.

## 2016-06-18 NOTE — Discharge Instructions (Signed)
It was our pleasure to provide your ER care today - we hope that you feel better.  Rest. Drink adequate fluids.  Take doxycycline (antibiotic) as prescribed.  Take prednisone as prescribed.  Use albuterol inhaler 2 puffs every 4 hours as need for wheezing.  Follow up with primary care doctor in the coming week for recheck if symptoms fail to improve/resolve.  Return to ER if worse, increased trouble breathing, chest pain, other concern.   You were given pain medication in the ER - no driving for the next 4 hours.

## 2016-06-19 ENCOUNTER — Other Ambulatory Visit: Payer: Self-pay | Admitting: Pharmacist

## 2016-06-19 NOTE — Patient Outreach (Addendum)
[facility]) Care Management  06/19/2016  [name] 1940-11-12 [id number]  Second unsuccessful attempt to contact patient.  No answer, HIPAA compliant voicemail left for patient requesting a return call.    Plan:  Will make a third outreach attempt next week if no return call from patient.   Karrie Meres, PharmD, [place] [phone]  1409:  Patient returned call to [facility] Pharmacist, HIPAA details verified, and [facility] program explained to patient.  She agreed to pharmacy services from [facility].    Discussed patient's PCP, Toniann Ket, [name], referred patient to [facility] for "medication assistance."  Patient reports confusion with when to take her medications and how to fill a pill planner.   During conversation with patient, it became apparent she may benefit from a [place] home visit---patient accepted pharmacy home visit.   Patient did ask about three medications she received from ED on 06/18/16---counseled patient prednisone is a steroid to help reduce inflammation and should be taken with food, counseled patient doxycycline should be taken with food, is an antibiotic, and albuterol inhaler is an inhaler to help reduce shortness of breath/wheezing.    Plan:  Home visit scheduled with patient for this month.   Karrie Meres, PharmD, [place] [phone]

## 2016-06-22 ENCOUNTER — Other Ambulatory Visit (INDEPENDENT_AMBULATORY_CARE_PROVIDER_SITE_OTHER): Payer: Medicare Other

## 2016-06-22 ENCOUNTER — Ambulatory Visit (INDEPENDENT_AMBULATORY_CARE_PROVIDER_SITE_OTHER): Payer: Medicare Other | Admitting: Family

## 2016-06-22 ENCOUNTER — Encounter: Payer: Self-pay | Admitting: Family

## 2016-06-22 VITALS — BP 170/78 | HR 62 | Temp 98.1°F | Resp 20 | Ht 62.0 in | Wt 141.0 lb

## 2016-06-22 DIAGNOSIS — Z5181 Encounter for therapeutic drug level monitoring: Secondary | ICD-10-CM | POA: Diagnosis not present

## 2016-06-22 DIAGNOSIS — J209 Acute bronchitis, unspecified: Secondary | ICD-10-CM | POA: Diagnosis not present

## 2016-06-22 LAB — CBC
HCT: 31.2 % — ABNORMAL LOW (ref 36.0–46.0)
Hemoglobin: 10.2 g/dL — ABNORMAL LOW (ref 12.0–15.0)
MCHC: 32.7 g/dL (ref 30.0–36.0)
MCV: 78.4 fl (ref 78.0–100.0)
Platelets: 183 10*3/uL (ref 150.0–400.0)
RBC: 3.97 Mil/uL (ref 3.87–5.11)
RDW: 20.3 % — AB (ref 11.5–15.5)
WBC: 6.1 10*3/uL (ref 4.0–10.5)

## 2016-06-22 LAB — PROTIME-INR
INR: 3.3 ratio — AB (ref 0.8–1.0)
PROTHROMBIN TIME: 35.9 s — AB (ref 9.6–13.1)

## 2016-06-22 MED ORDER — BUDESONIDE-FORMOTEROL FUMARATE 160-4.5 MCG/ACT IN AERO: 2.0000 | INHALATION_SPRAY | Freq: Two times a day (BID) | RESPIRATORY_TRACT | 0 refills | Status: DC

## 2016-06-22 MED ORDER — LEVOFLOXACIN 500 MG PO TABS: 500.0000 mg | ORAL_TABLET | Freq: Every day | ORAL | 0 refills | Status: DC

## 2016-06-22 NOTE — Patient Instructions (Signed)
Thank you for choosing [facility].  SUMMARY AND INSTRUCTIONS:  Please stop the DOXYCYCLINE. Start taking LEVOFLOXACIN.  Start the Symbicort 2 puffs 2x daily.  Mucinex-DM to help with congestion.  Deep breathing and coughing to help clear secretions and prevent pneumonia.   Medication:  Your prescription(s) have been submitted to your pharmacy or been printed and provided for you. Please take as directed and contact our office if you believe you are having problem(s) with the medication(s) or have any questions.   Follow up:  If your symptoms worsen or fail to improve, please contact our office for further instruction, or in case of emergency go directly to the emergency room at the closest medical facility.     Acute Bronchitis, Adult Acute bronchitis is when air tubes (bronchi) in the lungs suddenly get swollen. The condition can make it hard to breathe. It can also cause these symptoms:  A cough.  Coughing up clear, yellow, or green mucus.  Wheezing.  Chest congestion.  Shortness of breath.  A fever.  Body aches.  Chills.  A sore throat. Follow these instructions at home: Medicines  Take over-the-counter and prescription medicines only as told by your doctor.  If you were prescribed an antibiotic medicine, take it as told by your doctor. Do not stop taking the antibiotic even if you start to feel better. General instructions  Rest.  Drink enough fluids to keep your pee (urine) clear or pale yellow.  Avoid smoking and secondhand smoke. If you smoke and you need help quitting, ask your doctor. Quitting will help your lungs heal faster.  Use an inhaler, cool mist vaporizer, or humidifier as told by your doctor.  Keep all follow-up visits as told by your doctor. This is important. How is this prevented? To lower your risk of getting this condition again:  Wash your hands often with soap and water. If you cannot use soap and water, use hand  sanitizer.  Avoid contact with people who have cold symptoms.  Try not to touch your hands to your mouth, nose, or eyes.  Make sure to get the flu shot every year. Contact a doctor if:  Your symptoms do not get better in 2 weeks. Get help right away if:  You cough up blood.  You have chest pain.  You have very bad shortness of breath.  You become dehydrated.  You faint (pass out) or keep feeling like you are going to pass out.  You keep throwing up (vomiting).  You have a very bad headache.  Your fever or chills gets worse. This information is not intended to replace advice given to you by your health care provider. Make sure you discuss any questions you have with your health care provider. Document Released: 12/06/2007 Document Revised: 01/26/2016 Document Reviewed: 12/08/2015 Elsevier Interactive Patient Education  2017 [employer].

## 2016-06-22 NOTE — Progress Notes (Signed)
Subjective:    Patient ID: [name], female    DOB: May 05, 1941, 75 y.o.   MRN: [id number]  Chief Complaint  Patient presents with  . Hospitalization Follow-up    cough, SOB, wheezing     HPI:  [name] is a 75 y.o. female who  has a past medical history of Allergy; Anemia (2005); Arthritis; Atrial fibrillation ([place]); Broken back (2013); CHF (congestive heart failure) ([place]); Esophageal cancer ([place]) (05/06/15); GERD (gastroesophageal reflux disease); H/O iron deficiency; HH (hiatus hernia) (2008); Hypertension; PONV (postoperative nausea and vomiting); Pulmonary emboli ([place]) (2008, 2012); Stroke [facility]); and Transfusion history. and presents today for a follow up office visit.   Recently evaluated in the emergency department with chief complaint of cough, congestion, and shortness of breath that was going on approximately one to 2 days prior to presentation. Physical exam with no respiratory distress and positive for wheezing. EKG showed sinus rhythm. Chest x-ray with minimal right base atelectasis with no effusions or other abnormalities. She was started on doxycyline and prednisone.  Since leaving the ED she continues to experience the associated symptoms of cough, shortness of breath and wheezing. Reports taking the doxycycline and prednisone as prescribed with no significant improvement. No fevers. Symptoms are generally constant. Does have occasional dizziness.  Allergies  Allergen Reactions  . Iodinated Diagnostic Agents Anaphylaxis    IPD dye Info given by patient  . Ioxaglate Anaphylaxis    Info given by patient  . Milk-Related Compounds Anaphylaxis    Lactose intolerance  . Red Dye Anaphylaxis  . Whey     Lactose intolerance  . Darvon [Propoxyphene] Rash  . Hydralazine Anxiety    Facial flushing, pt prefers not to use it.       Outpatient Medications Prior to Visit  Medication Sig Dispense Refill  . acetaminophen (TYLENOL) 500 MG tablet Take 500-1,000 mg by  mouth daily as needed for mild pain or moderate pain.     [name] albuterol (PROVENTIL HFA;VENTOLIN HFA) 108 (90 Base) MCG/ACT inhaler Inhale 2 puffs into the lungs every 4 (four) hours as needed for wheezing or shortness of breath. 1 Inhaler 1  . amLODipine (NORVASC) 10 MG tablet TAKE ONE TABLET BY MOUTH ONCE DAILY. 90 tablet 0  . cloNIDine (CATAPRES) 0.1 MG tablet Take 1 tablet (0.1 mg total) by mouth daily as needed (high blood pressure, will take if levels are 180/90). (Patient taking differently: Take 0.1 mg by mouth daily as needed (high blood pressure, take if blood pressure is >179/99.). ) 30 tablet 0  . cyanocobalamin (,VITAMIN B-12,) 1000 MCG/ML injection IM daily 11/29-12/3, then weekly x4, then monthly. 9 mL 0  . doxycycline (VIBRAMYCIN) 100 MG capsule Take 1 capsule (100 mg total) by mouth 2 (two) times daily. 14 capsule 0  . furosemide (LASIX) 40 MG tablet Take 1 tablet (40 mg total) by mouth daily. 30 tablet 0  . gabapentin (NEURONTIN) 100 MG capsule TAKE 1 CAPSULE BY MOUTH THREE TIMES A DAY. 90 capsule 3  . isosorbide mononitrate (IMDUR) 30 MG 24 hr tablet Take 1 tablet (30 mg total) by mouth daily. 30 tablet 0  . metoprolol (LOPRESSOR) 50 MG tablet TAKE 1 TABLET BY MOUTH TWICE DAILY. 180 tablet 0  . NONFORMULARY OR COMPOUNDED ITEM Diclofenac/Baclofen/Cyclobenzaprine/Gabapentin/Lidocaine 3/2/2/6/2.5% Cream  Apply to affected area 2-3 times per day as needed for arthritic pain. (Patient taking differently: Apply 1 application topically daily as needed (for arthritis). Diclofenac/Baclofen/Cyclobenzaprine/Gabapentin/Lidocaine 3/2/2/6/2.5% Cream  Apply to affected area 2-3 times  per day as needed for arthritic pain.) 1 each 1  . pantoprazole (PROTONIX) 40 MG tablet TAKE 1 TABLET BY MOUTH TWICE DAILY. 60 tablet 0  . Pediatric Multivitamins-Iron (FLINTSTONES PLUS IRON) chewable tablet Chew 2 tablets by mouth daily. 60 tablet 0  . predniSONE (DELTASONE) 20 MG tablet Take 2 tablets (40 mg total) by  mouth daily. 8 tablet 0  . simvastatin (ZOCOR) 20 MG tablet TAKE (1) TABLET BY MOUTH AT BEDTIME. 30 tablet 0  . temazepam (RESTORIL) 15 MG capsule TAKE 1 CAPSULE BY MOUTH AT BEDTIME. 30 capsule 2  . traMADol (ULTRAM) 50 MG tablet Take 1 tablet (50 mg total) by mouth every 8 (eight) hours. 90 tablet 0  . valsartan (DIOVAN) 320 MG tablet TAKE 1/2 TABLET BY MOUTH ONCE DAILY. 45 tablet 2  . warfarin (COUMADIN) 5 MG tablet TAKE 1/2 TABLET BY MOUTH ON WEDNESDAY AND 1 TABLET ALL OTHER DAYS. 90 tablet 0   No facility-administered medications prior to visit.       Past Surgical History:  Procedure Laterality Date  . APPENDECTOMY  1950s  . CARPAL TUNNEL RELEASE Bilateral 1990s  . [place] SURGERY  2014  . CHOLECYSTECTOMY  1980s   open  . COLONOSCOPY N/A 02/17/2015   Procedure: COLONOSCOPY;  Surgeon: [name], MD;  Location: [facility] ENDOSCOPY;  Service: Endoscopy;  Laterality: N/A;  . ESOPHAGOGASTRODUODENOSCOPY N/A 02/16/2015   Procedure: ESOPHAGOGASTRODUODENOSCOPY (EGD);  Surgeon: [name], MD;  Location: [facility];  Service: Endoscopy;  Laterality: N/A;  . ESOPHAGOGASTRODUODENOSCOPY N/A 05/06/2015   Procedure: ESOPHAGOGASTRODUODENOSCOPY (EGD);  Surgeon: [name], MD;  Location: [facility] ENDOSCOPY;  Service: Endoscopy;  Laterality: N/A;  . EUS N/A 05/20/2015   Procedure: UPPER ENDOSCOPIC ULTRASOUND (EUS) LINEAR;  Surgeon: [name], MD;  Location: [facility] ENDOSCOPY;  Service: Endoscopy;  Laterality: N/A;  . exploratory lab  1950s or 1960s  . GIVENS CAPSULE STUDY N/A 05/06/2015   Procedure: GIVENS CAPSULE STUDY;  Surgeon: [name], MD;  Location: [facility] ENDOSCOPY;  Service: Endoscopy;  Laterality: N/A;  . lumbar back surgery  2012  . [place] SURGERY  1991  . TONSILLECTOMY AND ADENOIDECTOMY  1960s      Past Medical History:  Diagnosis Date  . Allergy   . Anemia 2005   generally microcytic, transfusions in [address], 2012, 02/2015, 05/2015  . Arthritis   . Atrial fibrillation  ([place])   . Broken back 2013   chronic back pain.   . CHF (congestive heart failure) ([place])   . Esophageal cancer ([place]) 05/06/15   adenocarcinoma ge junction  . GERD (gastroesophageal reflux disease)   . H/O iron deficiency    05-06-15 iron infusion ([facility])  . HH (hiatus hernia) 2008   large with associated erosions.   . Hypertension   . PONV (postoperative nausea and vomiting)   . Pulmonary emboli ([place]) 2008, 2012  . Stroke ([place])   . Transfusion history    2 units transfused 05-06-15([facility])      Review of Systems  Constitutional: Negative for chills and fever.  HENT: Positive for congestion. Negative for sinus pain, sinus pressure, sore throat and trouble swallowing.   Respiratory: Positive for cough, shortness of breath and wheezing. Negative for chest tightness.   Cardiovascular: Negative for chest pain, palpitations and leg swelling.  Neurological: Negative for headaches.      Objective:    BP (!) 170/78 (BP Location: Left Arm, Patient Position: Sitting, Cuff Size: Normal)   Pulse 62  Temp 98.1 F (36.7 C) (Oral)   Resp 20   Ht 5\' 2"  (1.575 m)   Wt 141 lb (64 kg)   SpO2 96%   BMI 25.79 kg/m  Nursing note and vital signs reviewed.  Physical Exam  Constitutional: She is oriented to person, place, and time. She appears well-developed and well-nourished. No distress.  Audible wheezing and speaking in complete sentences as she is able.   Cardiovascular: Normal rate, regular rhythm, normal heart sounds and intact distal pulses.   Pulmonary/Chest: Effort normal. No respiratory distress. She has wheezes. She has no rales. She exhibits no tenderness.  Neurological: She is alert and oriented to person, place, and time.  Skin: Skin is warm and dry.  Psychiatric: She has a normal mood and affect. Her behavior is normal. Judgment and thought content normal.       Assessment & Plan:   Problem List Items Addressed This Visit      Respiratory   Acute bronchitis    Symptoms  and exam are consistent with acute bronchitis with previous chest x-ray reviewed and negative for pneumonia. Vital signs are stable. There is concern for previous acid reflux or GE junction carcinoma. Obtain CBC. Change doxycycline to levofloxacin. Start Symbicort. Continue current dosage of albuterol and prednisone. Encouraged deep breathing. Follow up if symptoms worsen or do not improve. Discussed having a low threshold to return to the ED if her symptoms worsen.       Relevant Medications   budesonide-formoterol (SYMBICORT) 160-4.5 MCG/ACT inhaler   levofloxacin (LEVAQUIN) 500 MG tablet   Other Relevant Orders   CBC     Other   Encounter for therapeutic drug monitoring - Primary   Relevant Orders   INR/PT       I am having Ms. Crutcher start on budesonide-formoterol and levofloxacin. I am also having her maintain her NONFORMULARY OR COMPOUNDED ITEM, cloNIDine, acetaminophen, valsartan, traMADol, gabapentin, temazepam, cyanocobalamin, furosemide, isosorbide mononitrate, FLINTSTONES PLUS IRON, amLODipine, metoprolol, warfarin, pantoprazole, simvastatin, doxycycline, albuterol, and predniSONE.   Meds ordered this encounter  Medications  . budesonide-formoterol (SYMBICORT) 160-4.5 MCG/ACT inhaler    Sig: Inhale 2 puffs into the lungs 2 (two) times daily.    Dispense:  1 Inhaler    Refill:  0    Order Specific Question:   Supervising Provider    Answer:   Pricilla Holm A [id number]  . levofloxacin (LEVAQUIN) 500 MG tablet    Sig: Take 1 tablet (500 mg total) by mouth daily.    Dispense:  7 tablet    Refill:  0    Order Specific Question:   Supervising Provider    Answer:   Pricilla Holm A [id number]     Follow-up: Return if symptoms worsen or fail to improve.  Mauricio Po, [name]

## 2016-06-22 NOTE — Assessment & Plan Note (Signed)
Symptoms and exam are consistent with acute bronchitis with previous chest x-ray reviewed and negative for pneumonia. Vital signs are stable. There is concern for previous acid reflux or GE junction carcinoma. Obtain CBC. Change doxycycline to levofloxacin. Start Symbicort. Continue current dosage of albuterol and prednisone. Encouraged deep breathing. Follow up if symptoms worsen or do not improve. Discussed having a low threshold to return to the ED if her symptoms worsen.

## 2016-06-23 ENCOUNTER — Ambulatory Visit: Payer: Medicare Other

## 2016-06-23 ENCOUNTER — Telehealth: Payer: Self-pay | Admitting: Cardiology

## 2016-06-23 ENCOUNTER — Encounter (HOSPITAL_COMMUNITY): Payer: Self-pay | Admitting: Emergency Medicine

## 2016-06-23 ENCOUNTER — Emergency Department (HOSPITAL_COMMUNITY): Payer: Medicare Other

## 2016-06-23 ENCOUNTER — Ambulatory Visit (INDEPENDENT_AMBULATORY_CARE_PROVIDER_SITE_OTHER): Payer: Medicare Other | Admitting: General Practice

## 2016-06-23 ENCOUNTER — Inpatient Hospital Stay (HOSPITAL_COMMUNITY)
Admission: EM | Admit: 2016-06-23 | Discharge: 2016-06-27 | DRG: 309 | Disposition: A | Discharge status: Home / Self Care | Payer: Medicare Other | Attending: Family Medicine | Admitting: Family Medicine

## 2016-06-23 DIAGNOSIS — Z8501 Personal history of malignant neoplasm of esophagus: Secondary | ICD-10-CM

## 2016-06-23 DIAGNOSIS — Z8249 Family history of ischemic heart disease and other diseases of the circulatory system: Secondary | ICD-10-CM | POA: Diagnosis not present

## 2016-06-23 DIAGNOSIS — Z981 Arthrodesis status: Secondary | ICD-10-CM

## 2016-06-23 DIAGNOSIS — R0602 Shortness of breath: Secondary | ICD-10-CM | POA: Diagnosis not present

## 2016-06-23 DIAGNOSIS — Z79899 Other long term (current) drug therapy: Secondary | ICD-10-CM

## 2016-06-23 DIAGNOSIS — I509 Heart failure, unspecified: Secondary | ICD-10-CM

## 2016-06-23 DIAGNOSIS — I110 Hypertensive heart disease with heart failure: Secondary | ICD-10-CM | POA: Diagnosis not present

## 2016-06-23 DIAGNOSIS — I480 Paroxysmal atrial fibrillation: Secondary | ICD-10-CM | POA: Diagnosis not present

## 2016-06-23 DIAGNOSIS — I248 Other forms of acute ischemic heart disease: Secondary | ICD-10-CM | POA: Diagnosis not present

## 2016-06-23 DIAGNOSIS — R000 Tachycardia, unspecified: Secondary | ICD-10-CM | POA: Diagnosis not present

## 2016-06-23 DIAGNOSIS — K449 Diaphragmatic hernia without obstruction or gangrene: Secondary | ICD-10-CM | POA: Diagnosis present

## 2016-06-23 DIAGNOSIS — Z86711 Personal history of pulmonary embolism: Secondary | ICD-10-CM

## 2016-06-23 DIAGNOSIS — I5032 Chronic diastolic (congestive) heart failure: Secondary | ICD-10-CM | POA: Diagnosis present

## 2016-06-23 DIAGNOSIS — J209 Acute bronchitis, unspecified: Secondary | ICD-10-CM | POA: Diagnosis not present

## 2016-06-23 DIAGNOSIS — I2510 Atherosclerotic heart disease of native coronary artery without angina pectoris: Secondary | ICD-10-CM | POA: Diagnosis present

## 2016-06-23 DIAGNOSIS — R079 Chest pain, unspecified: Secondary | ICD-10-CM

## 2016-06-23 DIAGNOSIS — D500 Iron deficiency anemia secondary to blood loss (chronic): Secondary | ICD-10-CM | POA: Diagnosis not present

## 2016-06-23 DIAGNOSIS — Z823 Family history of stroke: Secondary | ICD-10-CM | POA: Diagnosis not present

## 2016-06-23 DIAGNOSIS — Z7901 Long term (current) use of anticoagulants: Secondary | ICD-10-CM | POA: Diagnosis not present

## 2016-06-23 DIAGNOSIS — Z85028 Personal history of other malignant neoplasm of stomach: Secondary | ICD-10-CM

## 2016-06-23 DIAGNOSIS — D649 Anemia, unspecified: Secondary | ICD-10-CM | POA: Diagnosis present

## 2016-06-23 DIAGNOSIS — R739 Hyperglycemia, unspecified: Secondary | ICD-10-CM

## 2016-06-23 DIAGNOSIS — M545 Low back pain: Secondary | ICD-10-CM

## 2016-06-23 DIAGNOSIS — I4891 Unspecified atrial fibrillation: Secondary | ICD-10-CM | POA: Diagnosis not present

## 2016-06-23 DIAGNOSIS — Z8673 Personal history of transient ischemic attack (TIA), and cerebral infarction without residual deficits: Secondary | ICD-10-CM

## 2016-06-23 DIAGNOSIS — I25119 Atherosclerotic heart disease of native coronary artery with unspecified angina pectoris: Secondary | ICD-10-CM | POA: Diagnosis not present

## 2016-06-23 DIAGNOSIS — K219 Gastro-esophageal reflux disease without esophagitis: Secondary | ICD-10-CM | POA: Diagnosis present

## 2016-06-23 DIAGNOSIS — E876 Hypokalemia: Secondary | ICD-10-CM | POA: Diagnosis present

## 2016-06-23 DIAGNOSIS — G8929 Other chronic pain: Secondary | ICD-10-CM | POA: Diagnosis present

## 2016-06-23 DIAGNOSIS — Z5181 Encounter for therapeutic drug level monitoring: Secondary | ICD-10-CM

## 2016-06-23 HISTORY — DX: Paroxysmal atrial fibrillation: I48.0

## 2016-06-23 HISTORY — DX: Nonrheumatic aortic (valve) stenosis: I35.0

## 2016-06-23 HISTORY — DX: Unspecified diastolic (congestive) heart failure: I50.30

## 2016-06-23 HISTORY — DX: Atherosclerotic heart disease of native coronary artery without angina pectoris: I25.10

## 2016-06-23 HISTORY — DX: Radiographic dye allergy status: Z91.041

## 2016-06-23 LAB — CBC WITH DIFFERENTIAL/PLATELET
BASOS PCT: 0 %
Basophils Absolute: 0 10*3/uL (ref 0.0–0.1)
EOS PCT: 0 %
Eosinophils Absolute: 0 10*3/uL (ref 0.0–0.7)
HEMATOCRIT: 35.3 % — AB (ref 36.0–46.0)
Hemoglobin: 11.1 g/dL — ABNORMAL LOW (ref 12.0–15.0)
Lymphocytes Relative: 12 %
Lymphs Abs: 1.2 10*3/uL (ref 0.7–4.0)
MCH: 25.6 pg — ABNORMAL LOW (ref 26.0–34.0)
MCHC: 31.4 g/dL (ref 30.0–36.0)
MCV: 81.5 fL (ref 78.0–100.0)
Monocytes Absolute: 1 10*3/uL (ref 0.1–1.0)
Monocytes Relative: 9 %
NEUTROS ABS: 8.2 10*3/uL — AB (ref 1.7–7.7)
Neutrophils Relative %: 79 %
PLATELETS: 145 10*3/uL — AB (ref 150–400)
RBC: 4.33 MIL/uL (ref 3.87–5.11)
RDW: 17.8 % — AB (ref 11.5–15.5)
WBC: 10.4 10*3/uL (ref 4.0–10.5)

## 2016-06-23 LAB — BASIC METABOLIC PANEL
Anion gap: 8 (ref 5–15)
BUN: 17 mg/dL (ref 6–20)
CALCIUM: 8.5 mg/dL — AB (ref 8.9–10.3)
CO2: 24 mmol/L (ref 22–32)
CREATININE: 0.96 mg/dL (ref 0.44–1.00)
Chloride: 105 mmol/L (ref 101–111)
GFR calc Af Amer: >60 mL/min (ref >60)
GFR, EST NON AFRICAN AMERICAN: 56 mL/min — AB (ref >60)
GLUCOSE: 224 mg/dL — AB (ref 65–99)
POTASSIUM: 4 mmol/L (ref 3.5–5.1)
SODIUM: 137 mmol/L (ref 135–145)

## 2016-06-23 LAB — PROTIME-INR
INR: 2.75
Prothrombin Time: 29.7 seconds — ABNORMAL HIGH (ref 11.4–15.2)

## 2016-06-23 LAB — TROPONIN I
TROPONIN I: 0.14 ng/mL — AB (ref <0.03)
Troponin I: 0.15 ng/mL (ref <0.03)
Troponin I: 0.1 ng/mL (ref <0.03)

## 2016-06-23 LAB — BRAIN NATRIURETIC PEPTIDE: B Natriuretic Peptide: 739 pg/mL — ABNORMAL HIGH (ref 0.0–100.0)

## 2016-06-23 LAB — MRSA PCR SCREENING: MRSA by PCR: NEGATIVE

## 2016-06-23 LAB — PHOSPHORUS: Phosphorus: 4.2 mg/dL (ref 2.5–4.6)

## 2016-06-23 LAB — MAGNESIUM: Magnesium: 1.9 mg/dL (ref 1.7–2.4)

## 2016-06-23 MED ORDER — MORPHINE SULFATE (PF) 2 MG/ML IV SOLN
2.0000 mg | Freq: Once | INTRAVENOUS | Status: AC
  Administered 2016-06-23: 2 mg via INTRAVENOUS
  Filled 2016-06-23: qty 1

## 2016-06-23 MED ORDER — FUROSEMIDE 10 MG/ML IJ SOLN
80.0000 mg | Freq: Once | INTRAMUSCULAR | Status: AC
  Administered 2016-06-23: 80 mg via INTRAVENOUS
  Filled 2016-06-23: qty 8

## 2016-06-23 MED ORDER — MORPHINE SULFATE (PF) 2 MG/ML IV SOLN
INTRAVENOUS | Status: AC
  Filled 2016-06-23: qty 1

## 2016-06-23 MED ORDER — DILTIAZEM HCL 100 MG IV SOLR
5.0000 mg/h | INTRAVENOUS | Status: DC
  Administered 2016-06-23: 5 mg/h via INTRAVENOUS
  Filled 2016-06-23: qty 100

## 2016-06-23 MED ORDER — TRAMADOL HCL 50 MG PO TABS
50.0000 mg | ORAL_TABLET | Freq: Four times a day (QID) | ORAL | Status: DC | PRN
  Administered 2016-06-23 – 2016-06-27 (×9): 50 mg via ORAL
  Filled 2016-06-23 (×10): qty 1

## 2016-06-23 MED ORDER — FUROSEMIDE 10 MG/ML IJ SOLN
40.0000 mg | Freq: Every day | INTRAMUSCULAR | Status: DC
  Administered 2016-06-24 – 2016-06-25 (×2): 40 mg via INTRAVENOUS
  Filled 2016-06-23 (×2): qty 4

## 2016-06-23 MED ORDER — WARFARIN SODIUM 2.5 MG PO TABS
2.5000 mg | ORAL_TABLET | Freq: Once | ORAL | Status: AC
  Administered 2016-06-23: 2.5 mg via ORAL
  Filled 2016-06-23: qty 1

## 2016-06-23 MED ORDER — PANTOPRAZOLE SODIUM 40 MG PO TBEC
40.0000 mg | DELAYED_RELEASE_TABLET | Freq: Two times a day (BID) | ORAL | Status: DC
  Administered 2016-06-23 – 2016-06-27 (×9): 40 mg via ORAL
  Filled 2016-06-23 (×9): qty 1

## 2016-06-23 MED ORDER — TEMAZEPAM 15 MG PO CAPS
15.0000 mg | ORAL_CAPSULE | Freq: Every day | ORAL | Status: DC
  Administered 2016-06-23 – 2016-06-26 (×4): 15 mg via ORAL
  Filled 2016-06-23 (×4): qty 1

## 2016-06-23 MED ORDER — SODIUM CHLORIDE 0.9% FLUSH
3.0000 mL | Freq: Two times a day (BID) | INTRAVENOUS | Status: DC
  Administered 2016-06-23 – 2016-06-27 (×8): 3 mL via INTRAVENOUS

## 2016-06-23 MED ORDER — GABAPENTIN 100 MG PO CAPS
100.0000 mg | ORAL_CAPSULE | Freq: Three times a day (TID) | ORAL | Status: DC
  Administered 2016-06-23 – 2016-06-27 (×12): 100 mg via ORAL
  Filled 2016-06-23 (×12): qty 1

## 2016-06-23 MED ORDER — METOPROLOL TARTRATE 50 MG PO TABS
50.0000 mg | ORAL_TABLET | Freq: Two times a day (BID) | ORAL | Status: DC
  Administered 2016-06-24 – 2016-06-27 (×6): 50 mg via ORAL
  Filled 2016-06-23 (×7): qty 1

## 2016-06-23 MED ORDER — MOMETASONE FURO-FORMOTEROL FUM 200-5 MCG/ACT IN AERO
2.0000 | INHALATION_SPRAY | Freq: Two times a day (BID) | RESPIRATORY_TRACT | Status: DC
  Administered 2016-06-23 – 2016-06-27 (×8): 2 via RESPIRATORY_TRACT
  Filled 2016-06-23: qty 8.8

## 2016-06-23 MED ORDER — ACETAMINOPHEN 500 MG PO TABS
500.0000 mg | ORAL_TABLET | Freq: Every day | ORAL | Status: DC | PRN
Start: 2016-06-23 — End: 2016-06-27
  Administered 2016-06-24 – 2016-06-25 (×2): 1000 mg via ORAL
  Filled 2016-06-23 (×2): qty 2

## 2016-06-23 MED ORDER — ONDANSETRON HCL 4 MG/2ML IJ SOLN
4.0000 mg | Freq: Once | INTRAMUSCULAR | Status: AC
  Administered 2016-06-23: 4 mg via INTRAVENOUS
  Filled 2016-06-23: qty 2

## 2016-06-23 MED ORDER — WARFARIN - PHARMACIST DOSING INPATIENT: Freq: Every day | Status: DC

## 2016-06-23 MED ORDER — SODIUM CHLORIDE 0.9% FLUSH: 3.0000 mL | INTRAVENOUS | Status: DC | PRN

## 2016-06-23 MED ORDER — ALBUTEROL SULFATE (2.5 MG/3ML) 0.083% IN NEBU
2.5000 mg | INHALATION_SOLUTION | RESPIRATORY_TRACT | Status: DC | PRN
  Administered 2016-06-23: 2.5 mg via RESPIRATORY_TRACT
  Filled 2016-06-23: qty 3

## 2016-06-23 MED ORDER — DILTIAZEM HCL 100 MG IV SOLR
5.0000 mg/h | INTRAVENOUS | Status: DC
  Administered 2016-06-23 – 2016-06-24 (×2): 5 mg/h via INTRAVENOUS
  Filled 2016-06-23 (×2): qty 100

## 2016-06-23 MED ORDER — SIMVASTATIN 20 MG PO TABS
20.0000 mg | ORAL_TABLET | Freq: Every day | ORAL | Status: DC
  Administered 2016-06-23: 20 mg via ORAL
  Filled 2016-06-23: qty 1

## 2016-06-23 MED ORDER — SODIUM CHLORIDE 0.9 % IV SOLN: 250.0000 mL | INTRAVENOUS | Status: DC | PRN

## 2016-06-23 NOTE — H&P (Signed)
History and Physical    [name] [id number] DOB: April 02, 1941 DOA: 06/23/2016  PCP: Mauricio Po, [name]  Patient coming from: Home  Chief Complaint: Shortness of breath  HPI: [name] is a 75 y.o. female with medical history significant of atrial fibrillation on coumadin, pe on coumadin, diastolic HF, anemia, and essential HTN. Who presents with 1 week complaint of shortness of breath. Was seen in the ED roughly 5 days ago and started on prednisone and antibiotic which did not help. Patient's condition gradually got worse. She denies any fevers. Did feel like she was warm to touch but denies coughing up any yellow or green sputum consistently. No recent travels reported. No hemoptysis. Nothing the patient is aware of makes it better. Ambulating makes it worse .  ED Course: Patient was found to be in atrial fibrillation with RVR and started on Cardizem drip. Subsequently cardiology consulted and we were consulted for further medical evaluation recommendations  Review of Systems: As per HPI otherwise 10 point review of systems negative.    Past Medical History:  Diagnosis Date  . Anemia 2005   Generally microcytic, transfusions in [address], 2012, 02/2015, 05/2015  . Aortic stenosis    Moderate November 2017  . Arthritis   . Broken back 2013   Chronic back pain.   [name] CAD (coronary artery disease)    Cardiac catheterization 2014 - 80% mid RCA and 70% OM managed medically  . Contrast media allergy   . Diastolic heart failure ([place])   . Esophageal cancer ([place]) 05/06/2015   Adenocarcinoma GE junction  . GERD (gastroesophageal reflux disease)   . H/O iron deficiency    05-06-15 iron infusion ([facility])  . HH (hiatus hernia) 2008   Large with associated erosions  . Hypertension   . Paroxysmal atrial fibrillation ([name])   . PONV (postoperative nausea and vomiting)   . Pulmonary emboli ([place]) 2008, 2012  . Stroke ([place])   . Transfusion history    2 units transfused 05-06-15 ([facility])      Past Surgical History:  Procedure Laterality Date  . APPENDECTOMY  1950s  . CARPAL TUNNEL RELEASE Bilateral 1990s  . [place] SURGERY  2014  . CHOLECYSTECTOMY  1980s   open  . COLONOSCOPY N/A 02/17/2015   Procedure: COLONOSCOPY;  Surgeon: [name], MD;  [facility] ENDOSCOPY;  Service: Endoscopy;  Laterality: N/A;  . ESOPHAGOGASTRODUODENOSCOPY N/A 02/16/2015   Procedure: ESOPHAGOGASTRODUODENOSCOPY (EGD);  Surgeon: [name], MD;  Location: [facility];  Service: Endoscopy;  Laterality: N/A;  . ESOPHAGOGASTRODUODENOSCOPY N/A 05/06/2015   Procedure: ESOPHAGOGASTRODUODENOSCOPY (EGD);  Surgeon: [name], MD;  Location: [facility] ENDOSCOPY;  Service: Endoscopy;  Laterality: N/A;  . EUS N/A 05/20/2015   Procedure: UPPER ENDOSCOPIC ULTRASOUND (EUS) LINEAR;  Surgeon: [name], MD;  Location: [facility] ENDOSCOPY;  Service: Endoscopy;  Laterality: N/A;  . exploratory lab  1950s or 1960s  . GIVENS CAPSULE STUDY N/A 05/06/2015   Procedure: GIVENS CAPSULE STUDY;  Surgeon: [name], MD;  Location: [facility] ENDOSCOPY;  Service: Endoscopy;  Laterality: N/A;  . lumbar back surgery  2012  . [place] SURGERY  1991  . TONSILLECTOMY AND ADENOIDECTOMY  1960s     reports that she has never smoked. She has never used smokeless tobacco. She reports that she does not drink alcohol or use drugs.  Allergies  Allergen Reactions  . Iodinated Diagnostic Agents Anaphylaxis    IPD dye Info given by patient  . Ioxaglate Anaphylaxis  Info given by patient  . Milk-Related Compounds Anaphylaxis    Lactose intolerance  . Red Dye Anaphylaxis  . Whey     Lactose intolerance  . Darvon [Propoxyphene] Rash  . Hydralazine Anxiety    Facial flushing, pt prefers not to use it.     Family History  Problem Relation Age of Onset  . Stroke Mother   . Heart disease Mother   . Emphysema Father   . Ovarian cancer Sister   . Stroke Sister   . Other Child     died at birth    Prior to Admission  medications   Medication Sig Start Date End Date Taking? Authorizing Provider  acetaminophen (TYLENOL) 500 MG tablet Take 500-1,000 mg by mouth daily as needed for mild pain or moderate pain.    Yes Historical Provider, MD  albuterol (PROVENTIL HFA;VENTOLIN HFA) 108 (90 Base) MCG/ACT inhaler Inhale 2 puffs into the lungs every 4 (four) hours as needed for wheezing or shortness of breath. 06/18/16  Yes Lajean Saver, MD  budesonide-formoterol [facility]) 160-4.5 MCG/ACT inhaler Inhale 2 puffs into the lungs 2 (two) times daily. 06/22/16  Yes Golden Circle, [name]  cloNIDine (CATAPRES) 0.1 MG tablet Take 1 tablet (0.1 mg total) by mouth daily as needed (high blood pressure, will take if levels are 180/90). Patient taking differently: Take 0.1 mg by mouth daily as needed (high blood pressure, take if blood pressure is >179/99.).  05/08/15  Yes Geradine Girt, DO  cyanocobalamin (,VITAMIN B-12,) 1000 MCG/ML injection IM daily 11/29-12/3, then weekly x4, then monthly. 05/30/16  Yes Samuella Cota, MD  furosemide (LASIX) 40 MG tablet Take 1 tablet (40 mg total) by mouth daily. 05/30/16  Yes Samuella Cota, MD  gabapentin (NEURONTIN) 100 MG capsule TAKE 1 CAPSULE BY MOUTH THREE TIMES A DAY. 02/22/16  Yes Golden Circle, [name]  levofloxacin (LEVAQUIN) 500 MG tablet Take 1 tablet (500 mg total) by mouth daily. 06/22/16  Yes Golden Circle, [name]  metoprolol (LOPRESSOR) 50 MG tablet TAKE 1 TABLET BY MOUTH TWICE DAILY. 06/07/16  Yes Golden Circle, [name]  NONFORMULARY OR COMPOUNDED ITEM Diclofenac/Baclofen/Cyclobenzaprine/Gabapentin/Lidocaine 3/2/2/6/2.5% Cream  Apply to affected area 2-3 times per day as needed for arthritic pain. Patient taking differently: Apply 1 application topically daily as needed (for arthritis). Diclofenac/Baclofen/Cyclobenzaprine/Gabapentin/Lidocaine 3/2/2/6/2.5% Cream  Apply to affected area 2-3 times per day as needed for arthritic pain. 04/16/15  Yes Golden Circle, [name]    pantoprazole (PROTONIX) 40 MG tablet TAKE 1 TABLET BY MOUTH TWICE DAILY. 06/07/16  Yes Golden Circle, [name]  Pediatric Multivitamins-Iron (FLINTSTONES PLUS IRON) chewable tablet Chew 2 tablets by mouth daily. 05/30/16  Yes Samuella Cota, MD  simvastatin (ZOCOR) 20 MG tablet TAKE (1) TABLET BY MOUTH AT BEDTIME. 06/07/16  Yes Golden Circle, [name]  temazepam (RESTORIL) 15 MG capsule TAKE 1 CAPSULE BY MOUTH AT BEDTIME. 03/31/16  Yes Golden Circle, [name]  traMADol (ULTRAM) 50 MG tablet Take 1 tablet (50 mg total) by mouth every 8 (eight) hours. 01/24/16  Yes Golden Circle, [name]  valsartan (DIOVAN) 320 MG tablet TAKE 1/2 TABLET BY MOUTH ONCE DAILY. 12/16/15  Yes Golden Circle, [name]  warfarin (COUMADIN) 5 MG tablet TAKE 1/2 TABLET BY MOUTH ON WEDNESDAY AND 1 TABLET ALL OTHER DAYS. 06/07/16  Yes Golden Circle, [name]  amLODipine (NORVASC) 10 MG tablet TAKE ONE TABLET BY MOUTH ONCE DAILY. Patient not taking: Reported on 06/23/2016 06/07/16   Golden Circle,  [name]  doxycycline (VIBRAMYCIN) 100 MG capsule Take 1 capsule (100 mg total) by mouth 2 (two) times daily. Patient not taking: Reported on 06/23/2016 06/18/16   Lajean Saver, MD  isosorbide mononitrate (IMDUR) 30 MG 24 hr tablet Take 1 tablet (30 mg total) by mouth daily. Patient not taking: Reported on 06/23/2016 05/31/16   Samuella Cota, MD  predniSONE (DELTASONE) 20 MG tablet Take 2 tablets (40 mg total) by mouth daily. Patient not taking: Reported on 06/23/2016 06/18/16   Lajean Saver, MD    Physical Exam: Vitals:   06/23/16 1130 06/23/16 1145 06/23/16 1226 06/23/16 1230  BP: 128/68 125/56 (!) 117/53 (!) 115/54  Pulse: 100 73    Resp: 18 15  19   Temp:      TempSrc:      SpO2: 99% 99%    Weight:      Height:        Constitutional: NAD, calm, comfortable Vitals:   06/23/16 1130 06/23/16 1145 06/23/16 1226 06/23/16 1230  BP: 128/68 125/56 (!) 117/53 (!) 115/54  Pulse: 100 73    Resp: 18 15  19   Temp:      TempSrc:       SpO2: 99% 99%    Weight:      Height:       Eyes: PERRL, lids and conjunctivae normal ENMT: Mucous membranes are moist. Posterior pharynx clear of any exudate or lesions.  Neck: normal, supple, no masses, no thyromegaly Respiratory: decreased breath sounds at bases, no wheezes, equal chest rise. Cardiovascular: Irregularly irregular, no rubs, no murmurs.  Abdomen: no tenderness, no masses palpated. No hepatosplenomegaly. Bowel sounds positive.  Musculoskeletal: no clubbing / cyanosis. No joint deformity upper and lower extremities.  Skin: no rashes, lesions, ulcers. No induration, limited exam Neurologic: CN 3-12 grossly intact. Sensation intact, DTR normal. Strength 5/5 in all 4.  Psychiatric: Normal judgment and insight.  Normal mood and affect   Labs on Admission: I have personally reviewed following labs and imaging studies  CBC:  Recent Labs Lab 06/18/16 0906 06/22/16 1227 06/23/16 0908  WBC 7.0 6.1 10.4  NEUTROABS  --   --  8.2*  HGB 10.9* 10.2* 11.1*  HCT 34.8* 31.2* 35.3*  MCV 81.1 78.4 81.5  PLT 192 183.0 [id number]*   Basic Metabolic Panel:  Recent Labs Lab 06/18/16 0906 06/23/16 0908  NA 139 137  K 3.8 4.0  CL 101 105  CO2 30 24  GLUCOSE 183* 224*  BUN 19 17  CREATININE 1.05* 0.96  CALCIUM 8.8* 8.5*   GFR: Estimated Creatinine Clearance: 43.8 mL/min (by C-G formula based on SCr of 0.96 mg/dL). Liver Function Tests: No results for input(s): AST, ALT, ALKPHOS, BILITOT, PROT, ALBUMIN in the last 168 hours. No results for input(s): LIPASE, AMYLASE in the last 168 hours. No results for input(s): AMMONIA in the last 168 hours. Coagulation Profile:  Recent Labs Lab 06/22/16 1227 06/23/16 0908  INR 3.3* 2.75   Cardiac Enzymes:  Recent Labs Lab 06/18/16 0906 06/23/16 0908  TROPONINI <0.03 0.14*   BNP (last 3 results) No results for input(s): PROBNP in the last 8760 hours. HbA1C: No results for input(s): HGBA1C in the last 72 hours. CBG: No results  for input(s): GLUCAP in the last 168 hours. Lipid Profile: No results for input(s): CHOL, HDL, LDLCALC, TRIG, CHOLHDL, LDLDIRECT in the last 72 hours. Thyroid Function Tests: No results for input(s): TSH, T4TOTAL, FREET4, T3FREE, THYROIDAB in the last 72 hours. Anemia Panel: No results for  input(s): VITAMINB12, FOLATE, FERRITIN, TIBC, IRON, RETICCTPCT in the last 72 hours. Urine analysis:    Component Value Date/Time   COLORURINE YELLOW 05/04/2015 2308   APPEARANCEUR CLOUDY (A) 05/04/2015 2308   LABSPEC 1.017 05/04/2015 2308   PHURINE 5.0 05/04/2015 2308   GLUCOSEU NEGATIVE 05/04/2015 2308   HGBUR NEGATIVE 05/04/2015 2308   BILIRUBINUR NEGATIVE 05/04/2015 2308   KETONESUR 15 (A) 05/04/2015 2308   PROTEINUR NEGATIVE 05/04/2015 2308   UROBILINOGEN 0.2 05/04/2015 2308   NITRITE NEGATIVE 05/04/2015 2308   LEUKOCYTESUR NEGATIVE 05/04/2015 2308   Sepsis Labs: !!!!!!!!!!!!!!!!!!!!!!!!!!!!!!!!!!!!!!!!!!!! @LABRCNTIP (procalcitonin:4,lacticidven:4) )No results found for this or any previous visit (from the past 240 hour(s)).   Radiological Exams on Admission: Dg Chest Portable 1 View  Result Date: 06/23/2016 CLINICAL DATA:  Chest pain, shortness of breath and atrial fibrillation. EXAM: PORTABLE CHEST 1 VIEW COMPARISON:  06/18/2016 FINDINGS: The heart size and mediastinal contours are within normal limits. Large hiatal hernia is again identified. Both lungs are clear. Previous anterior cervical disc fusion noted. IMPRESSION: 1. No acute findings 2. Hiatal hernia. . Electronically Signed   By: Kerby Moors M.D.   On: 06/23/2016 09:51    EKG: Independently reviewed. Atrial fibrillation with RVR  Assessment/Plan   Atrial fibrillation with rapid ventricular response ([name]) - Admit patient to stepdown unit - Consulted cardiology - Plan is to continue Cardizem drip  Active Problems: Essential hypertension - We'll continue home beta blocker next a.m. if blood pressure tolerates otherwise  hold other antihypertensives given principal problem and possible need for diltiazem    Long term current use of anticoagulant therapy - Pharmacy to dose Coumadin  PE -We'll continue Coumadin    Chest pain - Could be secondary to old PE but in lieu of new problem could also be related to principle problem. Will trend troponin levels - As mentioned above cardiology on board    GERD (gastroesophageal reflux disease) - Stable    Anemia - No active bleeding reported and improved value from last check    DVT prophylaxis: Coumadin per pharmacy Code Status: Full Family Communication: Discussed with daughter at bedside Disposition Plan: Pending improvement in condition Consults called: Cardiology Admission status: Inpatient   Velvet Bathe MD [facility] Pager [phone]  If 7PM-7AM, please contact night-coverage [email or web address] Password [facility]  06/23/2016, 12:41 PM

## 2016-06-23 NOTE — Progress Notes (Signed)
I have reviewed and agree with the plan. 

## 2016-06-23 NOTE — Telephone Encounter (Signed)
No Note needed

## 2016-06-23 NOTE — ED Triage Notes (Signed)
Pt reports sob x1 week, was seen in ED Sunday and d/c with inhaler.  Pt went to pcp yesterday and given at home breathing tx.  Today pt is more sob and having cp in center of chest without radiation.  PT also having epigastric pain and mid back pain.  Pt had diarrhea yesterday with nausea, denies emesis.  Pt also reports coughing up blood yesterday.

## 2016-06-23 NOTE — ED Provider Notes (Signed)
[facility] DEPT Provider Note   CSN: [id number] Arrival date & time: 06/23/16  [id number]     History   Chief Complaint Chief Complaint  Patient presents with  . Shortness of Breath  . Chest Pain    HPI ANALIE KICE is a 75 y.o. female with a past medical history of afib on coumadin, CHF and a history of prior PE presenting ongoing chest pain and shortness of breath which was present at her pcp visit yesterday morning at which time she was wheezing and was started on symbicort and placed on levaquin for acute bronchitis (prior to this was seen in the ed 5 days ago and placed on a prednisone taper and doxycycline for a bronchitis/wheezing flare).  Last night her symptoms worsened, including both anterior chest and also mid back pain which has been constant along with noted return to her a fib pattern of palpitations.  Her shortness of breath is worsened along with nausea without emesis, denies abdominal pain and denies worsened ankle edema. Her cough has been nonproductive.    She states she was admitted last month and given blood transfusions for severe anemia and states she really has not felt well since that admission.   HPI  Past Medical History:  Diagnosis Date  . Allergy   . Anemia 2005   generally microcytic, transfusions in [address], 2012, 02/2015, 05/2015  . Arthritis   . Atrial fibrillation ([place])   . Broken back 2013   chronic back pain.   . CHF (congestive heart failure) ([place])   . Esophageal cancer ([place]) 05/06/15   adenocarcinoma ge junction  . GERD (gastroesophageal reflux disease)   . H/O iron deficiency    05-06-15 iron infusion ([facility])  . HH (hiatus hernia) 2008   large with associated erosions.   . Hypertension   . PONV (postoperative nausea and vomiting)   . Pulmonary emboli ([place]) 2008, 2012  . Stroke ([place])   . Transfusion history    2 units transfused 05-06-15([facility])    Patient Active Problem List   Diagnosis Date Noted  . Acute bronchitis 06/22/2016  .  Encounter for medication review and counseling 06/07/2016  . Anemia 05/28/2016  . Essential hypertension, benign 04/19/2016  . Mild aortic stenosis 09/28/2015  . Chest pain at rest 09/05/2015  . 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 [place] lesion, acute   . IDA (iron deficiency anemia)   . Occult GI bleeding   . Nausea vomiting and diarrhea 05/05/2015  . Symptomatic anemia 05/05/2015  . HLD (hyperlipidemia) 05/05/2015  . Abdominal pain 05/05/2015  . GERD (gastroesophageal reflux disease) 05/05/2015  . Absolute anemia   . Anticoagulated on Coumadin   . Osteoarthritis 04/16/2015  . History of pulmonary embolism 03/02/2015  . Shortness of breath 02/22/2015  . Benign neoplasm of descending colon 02/17/2015  . Diverticulosis of large intestine without diverticulitis 02/17/2015  . Esophageal stricture 02/16/2015  . UGI bleed 02/13/2015  . Supratherapeutic INR 02/13/2015  . Encounter for therapeutic drug monitoring 10/15/2014  . Thoracic back pain 10/15/2014  . Macular degeneration 09/01/2014  . Pneumonia 08/27/2014  . Benign paroxysmal positional vertigo   . Hypertension 08/24/2014  . Back pain 08/03/2014  . Complaints of total body pain 08/03/2014  . Sleep disturbance 08/03/2014  . Aortic stenosis 03/05/2014  . PAF (paroxysmal atrial fibrillation) ([place]) 01/21/2014  . Long term current use of anticoagulant therapy 01/21/2014  . Chronic diastolic congestive heart failure ([place]) 01/21/2014  . Chest pain 01/21/2014  Past Surgical History:  Procedure Laterality Date  . APPENDECTOMY  1950s  . CARPAL TUNNEL RELEASE Bilateral 1990s  . [place] SURGERY  2014  . CHOLECYSTECTOMY  1980s   open  . COLONOSCOPY N/A 02/17/2015   Procedure: COLONOSCOPY;  Surgeon: [name], MD;  Location: [facility] ENDOSCOPY;  Service: Endoscopy;  Laterality: N/A;  . ESOPHAGOGASTRODUODENOSCOPY N/A 02/16/2015   Procedure: ESOPHAGOGASTRODUODENOSCOPY (EGD);  Surgeon: [name], MD;  Location:  [facility] ENDOSCOPY;  Service: Endoscopy;  Laterality: N/A;  . ESOPHAGOGASTRODUODENOSCOPY N/A 05/06/2015   Procedure: ESOPHAGOGASTRODUODENOSCOPY (EGD);  Surgeon: [name], MD;  Location: [facility] ENDOSCOPY;  Service: Endoscopy;  Laterality: N/A;  . EUS N/A 05/20/2015   Procedure: UPPER ENDOSCOPIC ULTRASOUND (EUS) LINEAR;  Surgeon: [name], MD;  Location: [facility] ENDOSCOPY;  Service: Endoscopy;  Laterality: N/A;  . exploratory lab  1950s or 1960s  . GIVENS CAPSULE STUDY N/A 05/06/2015   Procedure: GIVENS CAPSULE STUDY;  Surgeon: [name], MD;  Location: [facility] ENDOSCOPY;  Service: Endoscopy;  Laterality: N/A;  . lumbar back surgery  2012  . [place] SURGERY  1991  . TONSILLECTOMY AND ADENOIDECTOMY  1960s    OB History    No data available       Home Medications    Prior to Admission medications   Medication Sig Start Date End Date Taking? Authorizing Provider  acetaminophen (TYLENOL) 500 MG tablet Take 500-1,000 mg by mouth daily as needed for mild pain or moderate pain.     Historical Provider, MD  albuterol (PROVENTIL HFA;VENTOLIN HFA) 108 (90 Base) MCG/ACT inhaler Inhale 2 puffs into the lungs every 4 (four) hours as needed for wheezing or shortness of breath. 06/18/16   Lajean Saver, MD  amLODipine (NORVASC) 10 MG tablet TAKE ONE TABLET BY MOUTH ONCE DAILY. 06/07/16   Golden Circle, [name]  budesonide-formoterol (SYMBICORT) 160-4.5 MCG/ACT inhaler Inhale 2 puffs into the lungs 2 (two) times daily. 06/22/16   Golden Circle, [name]  cloNIDine (CATAPRES) 0.1 MG tablet Take 1 tablet (0.1 mg total) by mouth daily as needed (high blood pressure, will take if levels are 180/90). Patient taking differently: Take 0.1 mg by mouth daily as needed (high blood pressure, take if blood pressure is >179/99.).  05/08/15   Geradine Girt, DO  cyanocobalamin (,VITAMIN B-12,) 1000 MCG/ML injection IM daily 11/29-12/3, then weekly x4, then monthly. 05/30/16   Samuella Cota, MD  doxycycline (VIBRAMYCIN) 100  MG capsule Take 1 capsule (100 mg total) by mouth 2 (two) times daily. 06/18/16   Lajean Saver, MD  furosemide (LASIX) 40 MG tablet Take 1 tablet (40 mg total) by mouth daily. 05/30/16   Samuella Cota, MD  gabapentin (NEURONTIN) 100 MG capsule TAKE 1 CAPSULE BY MOUTH THREE TIMES A DAY. 02/22/16   Golden Circle, [name]  isosorbide mononitrate (IMDUR) 30 MG 24 hr tablet Take 1 tablet (30 mg total) by mouth daily. 05/31/16   Samuella Cota, MD  levofloxacin (LEVAQUIN) 500 MG tablet Take 1 tablet (500 mg total) by mouth daily. 06/22/16   Golden Circle, [name]  metoprolol (LOPRESSOR) 50 MG tablet TAKE 1 TABLET BY MOUTH TWICE DAILY. 06/07/16   Golden Circle, [name]  NONFORMULARY OR COMPOUNDED ITEM Diclofenac/Baclofen/Cyclobenzaprine/Gabapentin/Lidocaine 3/2/2/6/2.5% Cream  Apply to affected area 2-3 times per day as needed for arthritic pain. Patient taking differently: Apply 1 application topically daily as needed (for arthritis). Diclofenac/Baclofen/Cyclobenzaprine/Gabapentin/Lidocaine 3/2/2/6/2.5% Cream  Apply to affected area 2-3 times per day as needed  for arthritic pain. 04/16/15   Golden Circle, [name]  pantoprazole (PROTONIX) 40 MG tablet TAKE 1 TABLET BY MOUTH TWICE DAILY. 06/07/16   Golden Circle, [name]  Pediatric Multivitamins-Iron (FLINTSTONES PLUS IRON) chewable tablet Chew 2 tablets by mouth daily. 05/30/16   Samuella Cota, MD  predniSONE (DELTASONE) 20 MG tablet Take 2 tablets (40 mg total) by mouth daily. 06/18/16   Lajean Saver, MD  simvastatin (ZOCOR) 20 MG tablet TAKE (1) TABLET BY MOUTH AT BEDTIME. 06/07/16   Golden Circle, [name]  temazepam (RESTORIL) 15 MG capsule TAKE 1 CAPSULE BY MOUTH AT BEDTIME. 03/31/16   Golden Circle, [name]  traMADol (ULTRAM) 50 MG tablet Take 1 tablet (50 mg total) by mouth every 8 (eight) hours. 01/24/16   Golden Circle, [name]  valsartan (DIOVAN) 320 MG tablet TAKE 1/2 TABLET BY MOUTH ONCE DAILY. 12/16/15   Golden Circle, [name]  warfarin (COUMADIN) 5  MG tablet TAKE 1/2 TABLET BY MOUTH ON WEDNESDAY AND 1 TABLET ALL OTHER DAYS. 06/07/16   Golden Circle, [name]    Family History Family History  Problem Relation Age of Onset  . Stroke Mother   . Heart disease Mother   . Emphysema Father   . Ovarian cancer Sister   . Stroke Sister   . Other Child     died at birth    Social History Social History  Substance Use Topics  . Smoking status: Never Smoker  . Smokeless tobacco: Never Used  . Alcohol use No     Allergies   Iodinated diagnostic agents; Ioxaglate; Milk-related compounds; Red dye; Whey; Darvon [propoxyphene]; and Hydralazine   Review of Systems Review of Systems  Constitutional: Positive for fatigue. Negative for fever.  HENT: Negative for congestion and sore throat.   Eyes: Negative.   Respiratory: Positive for cough and shortness of breath. Negative for chest tightness.   Cardiovascular: Positive for chest pain and palpitations.  Gastrointestinal: Positive for nausea. Negative for abdominal pain and vomiting.  Genitourinary: Negative.   Musculoskeletal: Positive for back pain. Negative for arthralgias, joint swelling and neck pain.  Skin: Negative.  Negative for rash and wound.  Neurological: Positive for weakness. Negative for dizziness, light-headedness, numbness and headaches.  Psychiatric/Behavioral: Negative.      Physical Exam Updated Vital Signs BP 124/89   Pulse 74   Temp 98.6 F (37 C) (Oral)   Resp 23   Ht 5\' 2"  (1.575 m)   Wt 64 kg   SpO2 91%   BMI 25.79 kg/m   Physical Exam  Constitutional: She appears well-developed and well-nourished.  Pale appearing.  HENT:  Head: Normocephalic and atraumatic.  Mouth/Throat: Oropharynx is clear and moist.  Neck: Normal range of motion.  Cardiovascular: Normal heart sounds and intact distal pulses.  An irregularly irregular rhythm present.  Pulmonary/Chest: Effort normal. She has no wheezes.  Faint bilateral crackles bases.  No wheeze.  Abdominal:  Soft. Bowel sounds are normal. She exhibits no distension. There is no tenderness. There is no guarding.  Musculoskeletal: Normal range of motion. She exhibits no edema.  Neurological: She is alert.  Skin: Skin is warm and dry. She is not diaphoretic.  Psychiatric: She has a normal mood and affect.  Nursing note and vitals reviewed.    ED Treatments / Results  Labs  Results for orders placed or performed during the hospital encounter of [id number]  Basic metabolic panel  Result Value Ref Range   Sodium 137 135 - 145  mmol/L   Potassium 4.0 3.5 - 5.1 mmol/L   Chloride 105 101 - 111 mmol/L   CO2 24 22 - 32 mmol/L   Glucose, Bld 224 (H) 65 - 99 mg/dL   BUN 17 6 - 20 mg/dL   Creatinine, Ser 0.96 0.44 - 1.00 mg/dL   Calcium 8.5 (L) 8.9 - 10.3 mg/dL   GFR calc non Af Amer 56 (L) >60 mL/min   GFR calc Af Amer >60 >60 mL/min   Anion gap 8 5 - 15  Troponin I  Result Value Ref Range   Troponin I 0.14 (HH) <0.03 ng/mL  CBC with Differential  Result Value Ref Range   WBC 10.4 4.0 - 10.5 K/uL   RBC 4.33 3.87 - 5.11 MIL/uL   Hemoglobin 11.1 (L) 12.0 - 15.0 g/dL   HCT 35.3 (L) 36.0 - 46.0 %   MCV 81.5 78.0 - 100.0 fL   MCH 25.6 (L) 26.0 - 34.0 pg   MCHC 31.4 30.0 - 36.0 g/dL   RDW 17.8 (H) 11.5 - 15.5 %   Platelets 145 (L) 150 - 400 K/uL   Neutrophils Relative % 79 %   Neutro Abs 8.2 (H) 1.7 - 7.7 K/uL   Lymphocytes Relative 12 %   Lymphs Abs 1.2 0.7 - 4.0 K/uL   Monocytes Relative 9 %   Monocytes Absolute 1.0 0.1 - 1.0 K/uL   Eosinophils Relative 0 %   Eosinophils Absolute 0.0 0.0 - 0.7 K/uL   Basophils Relative 0 %   Basophils Absolute 0.0 0.0 - 0.1 K/uL  Protime-INR  Result Value Ref Range   Prothrombin Time 29.7 (H) 11.4 - 15.2 seconds   INR 2.75   Brain natriuretic peptide  Result Value Ref Range   B Natriuretic Peptide 739.0 (H) 0.0 - 100.0 pg/mL     EKG  EKG Interpretation  Date/Time:  Friday June 23 2016 08:48:36 EST Ventricular Rate:  143 PR Interval:      QRS Duration: 98 QT Interval:  317 QTC Calculation: 458 R Axis:   45 Text Interpretation:  Atrial fibrillation Paired ventricular premature complexes Consider anterior infarct ST depression, probably rate related Confirmed by Alvino Chapel  MD, Ovid Curd [phone]) on 06/23/2016 8:56:26 AM       Radiology Dg Chest Portable 1 View  Result Date: 06/23/2016 CLINICAL DATA:  Chest pain, shortness of breath and atrial fibrillation. EXAM: PORTABLE CHEST 1 VIEW COMPARISON:  06/18/2016 FINDINGS: The heart size and mediastinal contours are within normal limits. Large hiatal hernia is again identified. Both lungs are clear. Previous anterior cervical disc fusion noted. IMPRESSION: 1. No acute findings 2. Hiatal hernia. . Electronically Signed   By: Kerby Moors M.D.   On: 06/23/2016 09:51    Procedures Procedures (including critical care time)  Medications Ordered in ED Medications  diltiazem (CARDIZEM) 100 mg in dextrose 5 % 100 mL (1 mg/mL) infusion (5 mg/hr Intravenous New Bag/Given 06/23/16 0929)  morphine 2 MG/ML injection (not administered)  furosemide (LASIX) injection 80 mg (not administered)  morphine 2 MG/ML injection 2 mg (not administered)  morphine 2 MG/ML injection 2 mg (2 mg Intravenous Given 06/23/16 0930)  ondansetron (ZOFRAN) injection 4 mg (4 mg Intravenous Given 06/23/16 0930)     Initial Impression / Assessment and Plan / ED Course  I have reviewed the triage vital signs and the nursing notes.  Pertinent labs & imaging results that were available during my care of the patient were reviewed by me and considered in my medical  decision making (see chart for details).  Clinical Course     Afib with rvr with chf.  Elevation of troponin without St elevation changes on ekg, depression in lateral leads suggesting strain.  Pt started on cardizem drip with improving rate.  Lasix dose given here for chf.  Pt will need admission.  Spoke with Dr. Wendee Beavers who requests cardiology consult  prior to admission as pt may be better served in transfer to [facility].  Cardiology Jory Sims, NP consulted pt with Dr. Domenic Polite and felt she could stay at [facility].  Dr. Alvino Chapel spoke with Dr. Wendee Beavers who will see pt and admit.   Final Clinical Impressions(s) / ED Diagnoses   Final diagnoses:  Atrial fibrillation with RVR ([place])  Acute on chronic congestive heart failure, unspecified congestive heart failure type [facility])    New Prescriptions New Prescriptions   No medications on file     Evalee Jefferson, PA-C 06/23/16 [place], MD 06/23/16 1535

## 2016-06-23 NOTE — Progress Notes (Signed)
ANTICOAGULATION CONSULT NOTE - Initial Consult  Pharmacy Consult for coumadin Indication: atrial fibrillation  Allergies  Allergen Reactions  . Iodinated Diagnostic Agents Anaphylaxis    IPD dye Info given by patient  . Ioxaglate Anaphylaxis    Info given by patient  . Milk-Related Compounds Anaphylaxis    Lactose intolerance  . Red Dye Anaphylaxis  . Whey     Lactose intolerance  . Darvon [Propoxyphene] Rash  . Hydralazine Anxiety    Facial flushing, pt prefers not to use it.     Patient Measurements: Height: 5\' 2"  (157.5 cm) Weight: 141 lb (64 kg) IBW/kg (Calculated) : 50.1   Vital Signs: Temp: 98.6 F (37 C) (12/22 0846) Temp Source: Oral (12/22 0846) BP: 131/55 (12/22 1245) Pulse Rate: 73 (12/22 1145)  Labs:  Recent Labs  06/22/16 1227 06/23/16 0908  HGB 10.2* 11.1*  HCT 31.2* 35.3*  PLT 183.0 145*  LABPROT 35.9* 29.7*  INR 3.3* 2.75  CREATININE  --  0.96  TROPONINI  --  0.14*    Estimated Creatinine Clearance: 43.8 mL/min (by C-G formula based on SCr of 0.96 mg/dL).   Medical History: Past Medical History:  Diagnosis Date  . Anemia 2005   Generally microcytic, transfusions in [address], 2012, 02/2015, 05/2015  . Aortic stenosis    Moderate November 2017  . Arthritis   . Broken back 2013   Chronic back pain.   [name] CAD (coronary artery disease)    Cardiac catheterization 2014 - 80% mid RCA and 70% OM managed medically  . Contrast media allergy   . Diastolic heart failure ([place])   . Esophageal cancer ([place]) 05/06/2015   Adenocarcinoma GE junction  . GERD (gastroesophageal reflux disease)   . H/O iron deficiency    05-06-15 iron infusion ([facility])  . HH (hiatus hernia) 2008   Large with associated erosions  . Hypertension   . Paroxysmal atrial fibrillation ([name])   . PONV (postoperative nausea and vomiting)   . Pulmonary emboli ([place]) 2008, 2012  . Stroke ([place])   . Transfusion history    2 units transfused 05-06-15 ([facility])    Medications:  Coumadin  2.5 mg po on Wed, 5 mg other days  Assessment: 75 yo lady admitted with SOB found to be in afib with RVR.  Will continue coumadin therapy.  INR on admission is therapeutic. Goal of Therapy:  INR 2-3 Monitor platelets by anticoagulation protocol: Yes   Plan:  Coumadin 2.5 mg po today Daily PT/INR Monitor for bleeding complications  Thanks for allowing pharmacy to be a part of this patient's care.  Excell Seltzer, PharmD Clinical Pharmacist 06/23/2016,1:02 PM

## 2016-06-23 NOTE — ED Notes (Signed)
CRITICAL VALUE ALERT  Critical value received:  Troponin 0.14  Date of notification:  06/23/2016  Time of notification:  [id number]  Critical value read back:  Yes  Nurse who received alert:  Toma Copier  MD notified (1st page):  Evalee Jefferson  Time of first page:  [phone]  Responding MD:  Evalee Jefferson  Time MD responded:  [phone]

## 2016-06-23 NOTE — Patient Instructions (Signed)
Pre visit review using our clinic review tool, if applicable. No additional management support is needed unless otherwise documented below in the visit note. 

## 2016-06-23 NOTE — Consult Note (Signed)
CARDIOLOGY CONSULT NOTE   Patient ID: Miranda [name] MRN: [id number] DOB/AGE: 10-31-1940 75 y.o.  Admit Date: 06/23/2016 Referring Physician: Dr. Alvino Chapel  Primary Physician: Mauricio Po, [place] Consulting Cardiologist: Dr. Domenic Polite Primary Cardiologist: Dr. Debara Pickett  Reason for Consultation: Chest Pain and Dyspnea   Clinical Summary Miranda [name] is a 75 y.o.female followed in [place] by Dr. Debara Pickett with known history of PAF, CHADS VASC Score of at least 4 on coumadin therapy,  Diastolic CHF, coronary artery disease (most recent note from Dr. Debara Pickett in March 2017 on review of records sent to his office) catheterization in 2014 revealing 80% mid RCA stenosis, 70% OM lesion, mild to moderate disease of other vessels. "At that time no stent was recommended. "Leading  me to believe that they be smaller vessels." It is noted that she is allergic to IV contrast dye. Other history includes CVA, history of pulmonary emboli, hypertension, iron deficiency, acidic anemia requiring transfusions, and chronic musculoskeletal pain.  The patient has had worsening shortness of breath over the last month, cough and congestion. Cough is productive, with some scant blood in sputum. She presented to her primary care provider's office yesterday on 06/22/2016, at which time a chest x-ray was completed, negative for pneumonia and she was treated for bronchitis. She was started on levofloxacin, started on Symbicort inhaler, she was to continue current doses of albuterol and prednisone.  The patient states overnight she had worsening breathing and discomfort in her upper back and chest usually associated with coughing and deep breathing. She then presented to the emergency room with continued symptoms. She was found to be in atrial fibrillation with RVR, pulse 123 bpm, hypertensive with a blood pressure 203/66, she was afebrile. Glucose was elevated at 224, creatinine 0.96. First troponin 0.14. Hemoglobin 11.1 hematocrit  35.3, platelets 145. INR 2.75 with a PTT of 29.7. Chest x-ray did not reveal CHF, pneumonia, there was a hiatal hernia noted.  EKG revealed atrial fibrillation with RVR [name] rate in the 144 bpm. She was given diltiazem bolus and started on diltiazem drip. She was also given Lasix 80 mg 1 morphine and Zofran. She is currently continued to have cough and congestion with wheezing, coughing elicits chest discomfort. There was no overt edema noted.  Currently she is uncomfortable, slightly dyspneic with frequent coughing with pain in chest and she takes deep breaths and cough nonproductively.  Allergies  Allergen Reactions  . Iodinated Diagnostic Agents Anaphylaxis    IPD dye Info given by patient  . Ioxaglate Anaphylaxis    Info given by patient  . Milk-Related Compounds Anaphylaxis    Lactose intolerance  . Red Dye Anaphylaxis  . Whey     Lactose intolerance  . Darvon [Propoxyphene] Rash  . Hydralazine Anxiety    Facial flushing, pt prefers not to use it.     Home Medications No current facility-administered medications on file prior to encounter.    Current Outpatient Prescriptions on File Prior to Encounter  Medication Sig Dispense Refill  . acetaminophen (TYLENOL) 500 MG tablet Take 500-1,000 mg by mouth daily as needed for mild pain or moderate pain.     [name] albuterol (PROVENTIL HFA;VENTOLIN HFA) 108 (90 Base) MCG/ACT inhaler Inhale 2 puffs into the lungs every 4 (four) hours as needed for wheezing or shortness of breath. 1 Inhaler 1  . budesonide-formoterol (SYMBICORT) 160-4.5 MCG/ACT inhaler Inhale 2 puffs into the lungs 2 (two) times daily. 1 Inhaler 0  . cloNIDine (CATAPRES) 0.1 MG tablet Take 1  tablet (0.1 mg total) by mouth daily as needed (high blood pressure, will take if levels are 180/90). (Patient taking differently: Take 0.1 mg by mouth daily as needed (high blood pressure, take if blood pressure is >179/99.). ) 30 tablet 0  . cyanocobalamin (,VITAMIN B-12,) 1000 MCG/ML  injection IM daily 11/29-12/3, then weekly x4, then monthly. 9 mL 0  . furosemide (LASIX) 40 MG tablet Take 1 tablet (40 mg total) by mouth daily. 30 tablet 0  . gabapentin (NEURONTIN) 100 MG capsule TAKE 1 CAPSULE BY MOUTH THREE TIMES A DAY. 90 capsule 3  . levofloxacin (LEVAQUIN) 500 MG tablet Take 1 tablet (500 mg total) by mouth daily. 7 tablet 0  . metoprolol (LOPRESSOR) 50 MG tablet TAKE 1 TABLET BY MOUTH TWICE DAILY. 180 tablet 0  . NONFORMULARY OR COMPOUNDED ITEM Diclofenac/Baclofen/Cyclobenzaprine/Gabapentin/Lidocaine 3/2/2/6/2.5% Cream  Apply to affected area 2-3 times per day as needed for arthritic pain. (Patient taking differently: Apply 1 application topically daily as needed (for arthritis). Diclofenac/Baclofen/Cyclobenzaprine/Gabapentin/Lidocaine 3/2/2/6/2.5% Cream  Apply to affected area 2-3 times per day as needed for arthritic pain.) 1 each 1  . pantoprazole (PROTONIX) 40 MG tablet TAKE 1 TABLET BY MOUTH TWICE DAILY. 60 tablet 0  . Pediatric Multivitamins-Iron (FLINTSTONES PLUS IRON) chewable tablet Chew 2 tablets by mouth daily. 60 tablet 0  . simvastatin (ZOCOR) 20 MG tablet TAKE (1) TABLET BY MOUTH AT BEDTIME. 30 tablet 0  . temazepam (RESTORIL) 15 MG capsule TAKE 1 CAPSULE BY MOUTH AT BEDTIME. 30 capsule 2  . traMADol (ULTRAM) 50 MG tablet Take 1 tablet (50 mg total) by mouth every 8 (eight) hours. 90 tablet 0  . valsartan (DIOVAN) 320 MG tablet TAKE 1/2 TABLET BY MOUTH ONCE DAILY. 45 tablet 2  . warfarin (COUMADIN) 5 MG tablet TAKE 1/2 TABLET BY MOUTH ON WEDNESDAY AND 1 TABLET ALL OTHER DAYS. 90 tablet 0  . amLODipine (NORVASC) 10 MG tablet TAKE ONE TABLET BY MOUTH ONCE DAILY. (Patient not taking: Reported on 06/23/2016) 90 tablet 0  . doxycycline (VIBRAMYCIN) 100 MG capsule Take 1 capsule (100 mg total) by mouth 2 (two) times daily. (Patient not taking: Reported on 06/23/2016) 14 capsule 0  . isosorbide mononitrate (IMDUR) 30 MG 24 hr tablet Take 1 tablet (30 mg total) by  mouth daily. (Patient not taking: Reported on 06/23/2016) 30 tablet 0  . predniSONE (DELTASONE) 20 MG tablet Take 2 tablets (40 mg total) by mouth daily. (Patient not taking: Reported on 06/23/2016) 8 tablet 0    Past Medical History:  Diagnosis Date  . Anemia 2005   Generally microcytic, transfusions in [address], 2012, 02/2015, 05/2015  . Aortic stenosis    Moderate November 2017  . Arthritis   . Broken back 2013   Chronic back pain.   [name] CAD (coronary artery disease)    Cardiac catheterization 2014 - 80% mid RCA and 70% OM managed medically  . Contrast media allergy   . Diastolic [name] failure ([place])   . Esophageal cancer ([place]) 05/06/2015   Adenocarcinoma GE junction  . GERD (gastroesophageal reflux disease)   . H/O iron deficiency    05-06-15 iron infusion ([facility])  . HH (hiatus hernia) 2008   Large with associated erosions  . Hypertension   . Paroxysmal atrial fibrillation ([name])   . PONV (postoperative nausea and vomiting)   . Pulmonary emboli ([place]) 2008, 2012  . Stroke ([place])   . Transfusion history    2 units transfused 05-06-15 ([facility])    Past Surgical History:  Procedure Laterality Date  . APPENDECTOMY  1950s  . CARPAL TUNNEL RELEASE Bilateral 1990s  . [place] SURGERY  2014  . CHOLECYSTECTOMY  1980s   open  . COLONOSCOPY N/A 02/17/2015   Procedure: COLONOSCOPY;  Surgeon: [name], MD;  Location: [facility] ENDOSCOPY;  Service: Endoscopy;  Laterality: N/A;  . ESOPHAGOGASTRODUODENOSCOPY N/A 02/16/2015   Procedure: ESOPHAGOGASTRODUODENOSCOPY (EGD);  Surgeon: [name], MD;  Location: [facility] END[facility];  Service: Endoscopy;  Laterality: N/A;  . ESOPHAGOGASTRODUODENOSCOPY N/A 05/06/2015   Procedure: ESOPHAGOGASTRODUODENOSCOPY (EGD);  Surgeon: [name], MD;  Location: [facility] ENDOSCOPY;  Service: Endoscopy;  Laterality: N/A;  . EUS N/A 05/20/2015   Procedure: UPPER ENDOSCOPIC ULTRASOUND (EUS) LINEAR;  Surgeon: [name], MD;  Location: [facility] ENDOSCOPY;  Service: Endoscopy;   Laterality: N/A;  . exploratory lab  1950s or 1960s  . GIVENS CAPSULE STUDY N/A 05/06/2015   Procedure: GIVENS CAPSULE STUDY;  Surgeon: [name], MD;  Location[facility] END[facility]COPY;  Service: Endoscopy;  Laterality: N/A;  . lumbar back surgery  2012  . [place] SURGERY  1991  . TONSILLECTOMY AND ADENOIDECTOMY  1960s    Family History  Problem Relation Age of Onset  . Stroke Mother   . [name] disease Mother   . Emphysema Father   . Ovarian cancer Sister   . Stroke Sister   . Other Child     died at birth     Social History Miranda [name] reports that she has never smoked. She has never used smokeless tobacco. Miranda [name] reports that she does not drink alcohol.  Review of Systems Complete review of systems are found to be negative unless outlined in H&P above. Occasional palpitations.  Physical Examination Blood pressure (!) 115/54, pulse 73, temperature 98.6 F (37 C), temperature source Oral, resp. rate 19, height 5\' 2"  (1.575 m), weight 141 lb (64 kg), SpO2 99 %. No intake or output data in the 24 hours ending 06/23/16 1253  Telemetry: Atrial fibrillation 100-110 bpm.  GEN: Elderly woman, no distress. HEENT: Conjunctiva and lids normal, oropharynx clear. Neck: Supple, no elevated JVP or carotid bruits, no thyromegaly. Lungs: Coarse breath sounds with scattered rhonchi, nonlabored breathing at rest. Cardiac: Irregularly irregular, no S3, soft systolic murmur, no pericardial rub. Abdomen: Soft, nontender, bowel sounds present. Extremities: No pitting edema, distal pulses 2+. Skin: Warm and dry. Musculoskeletal: No kyphosis. Neuropsychiatric: Alert and oriented x3, affect grossly appropriate.  Prior Cardiac Testing/Procedures 1.Cardiac Cath  07/09/2012 Catheterization in 2014 revealing 80% mid RCA stenosis, 70% OM lesion, mild to moderate disease of other vessels. "At that time no stent was recommended.  2. Echocardiogram 05/28/2016 Left ventricle: The cavity size was normal.  There was mild focal   basal hypertrophy of the septum. Systolic function was vigorous.   The estimated ejection fraction was in the range of 65% to 70%.   Wall motion was normal; there were no regional wall motion   abnormalities. Features are consistent with a pseudonormal left   ventricular filling pattern, with concomitant abnormal relaxation   and increased filling pressure (grade 2 diastolic dysfunction). - Aortic valve: Trileaflet; severely calcified leaflets. There was   moderate stenosis. There was no significant regurgitation. Mean   gradient (S): 26 mm Hg. Peak gradient (S): 47 mm Hg. VTI ratio of   LVOT to aortic valve: 0.42. Valve area (VTI): 1.07 cm^2. - Mitral valve: Moderately calcified annulus. There was trivial   regurgitation. - Left atrium: The atrium was mildly dilated. -  Right atrium: The atrium was mildly dilated. Central venous   pressure (est): 3 mm Hg. - Tricuspid valve: There was trivial regurgitation. - Pulmonary arteries: Systolic pressure could not be accurately   estimated. - Pericardium, extracardiac: There was no pericardial effusion.  3.Stress Myoview 11/16/2009 (Transcribed from scanned document)  Impression: The post stress left ventricle is normal in size The post stress left ventricle function is normal The post stress ejection fraction is 80% Unremarkable pharmacological stress test Abnormal myocardial perfusion scan demonstrating attenuation artifact in the inferior region of the myocardium No ischemia or infarction/scar seen in the remaining myocardium  Lab Results  Basic Metabolic Panel:  Recent Labs Lab 06/18/16 0906 06/23/16 0908  NA 139 137  K 3.8 4.0  CL 101 105  CO2 30 24  GLUCOSE 183* 224*  BUN 19 17  CREATININE 1.05* 0.96  CALCIUM 8.8* 8.5*    CBC:  Recent Labs Lab 06/18/16 0906 06/22/16 1227 06/23/16 0908  WBC 7.0 6.1 10.4  NEUTROABS  --   --  8.2*  HGB 10.9* 10.2* 11.1*  HCT 34.8* 31.2* 35.3*  MCV 81.1 78.4  81.5  PLT 192 183.0 145*    Cardiac Enzymes:  Recent Labs Lab 06/18/16 0906 06/23/16 0908  TROPONINI <0.03 0.14*    Radiology: Dg Chest Portable 1 View  Result Date: 06/23/2016 CLINICAL DATA:  Chest pain, shortness of breath and atrial fibrillation. EXAM: PORTABLE CHEST 1 VIEW COMPARISON:  06/18/2016 FINDINGS: The [name] size and mediastinal contours are within normal limits. Large hiatal hernia is again identified. Both lungs are clear. Previous anterior cervical disc fusion noted. IMPRESSION: 1. No acute findings 2. Hiatal hernia. . Electronically Signed   By: Kerby Moors M.D.   On: 06/23/2016 09:51    ECG: Atrial fibrillation with RVR [name] rate of 144 bpm.   Impression and Recommendations  1. Atrial fibrillation with RVR: Has history of PAF, duration of most recent episode unclear but possibly associated with some of her shortness of breath in the last few weeks. She has been started on IV diltiazem. [name] rate is coming down. She remains hypertensive. Review of home medications has her on metoprolol 50 mg by mouth twice a day. She has not taken her medications today prior to coming to the ER. We'll give one dose of IV Lopressor 5 mg 1. Continue diltiazem drip. May need to change to amiodarone if she does not respond. Continue Coumadin therapy.  2. Thoracic discomfort with pleuritic component: This may be related to respiratory status. She states that the pain is constant, worsening with deep breaths and coughing. Troponin is elevated at 0.14. Would admit for further observation to rule out ACS, cycle cardiac markers. I think this is related to demand ischemia in the setting of rapid [name] rate at this point.  3. CAD: Most recent history of catheterization in 2014, with 80% RCA, 70% OM, moderate disease elsewhere. Continue risk modification, statin, beta blocker, ARB.  4. Hypertension: Blood pressure remains elevated despite IV diltiazem. Review of home medications has her on  clonidine 0.1 mg daily when necessary. She remains on losartan, amlodipine 10 mg, along with metoprolol.  5. Acute bronchitis: Frequent coughing possibly causing chest discomfort. She does have some blood-tinged sputum. Hemoglobin is stable.  6. Microcytic anemia: Follow.  7. History of diastolic [name] failure: Patient is normally on Lasix 40 mg daily. She has been given IV Lasix and has began to diuresis here in the ER.   Signed: Phill Myron. Purcell Nails NP  [facility]  06/23/2016, 12:53 PM Co-Sign MD   Attending note:  Patient seen and examined. Reviewed available records including Dr. Lysbeth Penner most recent note, discussed case with Ms. Lawrence NP. I modified above note. Miranda [name] presents complaining of approximately one month history of increasing shortness of breath, intermittent coughing which has become productive with slight blood-tinged sputum, perhaps some intermittent palpitations as well. She has recently been treated with inhalers and antibiotics, came to the ER as her symptoms have not improved and she was also having some pleuritic thoracic discomfort. Cardiac history includes moderate 2 vessel CAD as detailed above which has been managed medically based on cardiac catheterization in 2014, PAF on Coumadin, diastolic [name] failure, and moderate aortic stenosis. She reports compliance with her medications.  On assessment in the ER she was noted to be an atrial fibrillation with RVR, [name] rate coming down after institution of intravenous diltiazem. She is in no distress, lungs exhibit coarse breath sounds with scattered rhonchi, cardiac exam with irregularly irregular rapid rhythm and soft systolic murmur, no leg edema. Lab work shows creatinine 0.9, BNP 739, troponin I 0.14, hemoglobin 11.1, platelets 145. Chest x-ray shows no infiltrates. ECG shows atrial fibrillation with nonspecific ST changes.  Patient presents with recurrent rapid atrial fibrillation with history of PAF on Coumadin. INR  is currently therapeutic at 2.7 and she reports compliance with her medications. Duration of the most recent episode is unclear, possibly associated with some of her shortness of breath. There is also suspicion that she may have bronchitis as well as. Mild increase in troponin I at this point most likely reflects demand ischemia in the setting of CAD and aortic stenosis rather than ACS, although she should be admitted for further monitoring and cycling of cardiac markers. Agree with intravenous diltiazem, may be able to ultimately simplify to higher dose beta blocker which she was taking as an outpatient. Not clear that further ischemic testing will be needed as yet, depending on cardiac marker trend and clinical progress. If atrial fibrillation burden has been increasing and contributing to symptoms however, may need to consider antiarrhythmic therapy such as amiodarone.  Satira Sark, M.D., F.A.C.C.

## 2016-06-24 LAB — CBC
HEMATOCRIT: 34.2 % — AB (ref 36.0–46.0)
HEMOGLOBIN: 10.9 g/dL — AB (ref 12.0–15.0)
MCH: 26.2 pg (ref 26.0–34.0)
MCHC: 31.9 g/dL (ref 30.0–36.0)
MCV: 82.2 fL (ref 78.0–100.0)
Platelets: 164 10*3/uL (ref 150–400)
RBC: 4.16 MIL/uL (ref 3.87–5.11)
RDW: 18.1 % — ABNORMAL HIGH (ref 11.5–15.5)
WBC: 6.9 10*3/uL (ref 4.0–10.5)

## 2016-06-24 LAB — BASIC METABOLIC PANEL
ANION GAP: 10 (ref 5–15)
BUN: 12 mg/dL (ref 6–20)
CALCIUM: 8.2 mg/dL — AB (ref 8.9–10.3)
CHLORIDE: 101 mmol/L (ref 101–111)
CO2: 27 mmol/L (ref 22–32)
Creatinine, Ser: 0.92 mg/dL (ref 0.44–1.00)
GFR calc non Af Amer: 59 mL/min — ABNORMAL LOW (ref >60)
GLUCOSE: 183 mg/dL — AB (ref 65–99)
POTASSIUM: 3.2 mmol/L — AB (ref 3.5–5.1)
Sodium: 138 mmol/L (ref 135–145)

## 2016-06-24 LAB — TROPONIN I: Troponin I: 0.06 ng/mL (ref <0.03)

## 2016-06-24 LAB — PROTIME-INR
INR: 2.89
PROTHROMBIN TIME: 30.8 s — AB (ref 11.4–15.2)

## 2016-06-24 MED ORDER — BENZONATATE 100 MG PO CAPS
100.0000 mg | ORAL_CAPSULE | Freq: Three times a day (TID) | ORAL | Status: DC | PRN
  Administered 2016-06-24: 100 mg via ORAL
  Filled 2016-06-24: qty 1

## 2016-06-24 MED ORDER — DILTIAZEM HCL 30 MG PO TABS
30.0000 mg | ORAL_TABLET | Freq: Four times a day (QID) | ORAL | Status: DC
  Administered 2016-06-24 – 2016-06-25 (×5): 30 mg via ORAL
  Filled 2016-06-24 (×4): qty 1

## 2016-06-24 MED ORDER — WARFARIN SODIUM 2.5 MG PO TABS
2.5000 mg | ORAL_TABLET | Freq: Once | ORAL | Status: AC
  Administered 2016-06-24: 2.5 mg via ORAL
  Filled 2016-06-24: qty 1

## 2016-06-24 MED ORDER — ATORVASTATIN CALCIUM 10 MG PO TABS
10.0000 mg | ORAL_TABLET | Freq: Every day | ORAL | Status: DC
  Administered 2016-06-24 – 2016-06-26 (×3): 10 mg via ORAL
  Filled 2016-06-24 (×3): qty 1

## 2016-06-24 MED ORDER — POTASSIUM CHLORIDE CRYS ER 20 MEQ PO TBCR
40.0000 meq | EXTENDED_RELEASE_TABLET | Freq: Every day | ORAL | Status: DC
  Administered 2016-06-24 – 2016-06-27 (×4): 40 meq via ORAL
  Filled 2016-06-24 (×4): qty 2

## 2016-06-24 NOTE — Progress Notes (Signed)
PROGRESS NOTE    [name]  [id number] DOB: Nov 07, 1940 DOA: 06/23/2016 PCP: Mauricio Po, [name]   Brief Narrative:  75 y.o. female with medical history significant of atrial fibrillation on coumadin, pe on coumadin, diastolic HF, anemia, and essential HTN. Who presents with 1 week complaint of shortness of breath. Was seen in the ED roughly 5 days ago and started on prednisone and antibiotic which did not help   Assessment & Plan:    Atrial fibrillation with rapid ventricular response ([place]) - Will transition to oral Cardizem, discussed with nursing - Continue to monitor and step down  Active Problems:   Long term current use of anticoagulant therapy - Coumadin per pharmacy    Chest pain - Elevated troponin was secondary to demand ischemia. No chest pain reported today    GERD (gastroesophageal reflux disease) - Stable currently  Hypokalemia -Replace orally    Anemia  - No active bleeding will continue to monitor   DVT prophylaxis: (Coumadin Code Status: Full code Family Communication: Discussed directly with patient Disposition Plan: Pending improvement in condition   Consultants:   Cardiology   Procedures: None   Antimicrobials: None   Subjective: No acute issues overnight. Only complaint is cough and she would like some medication to suppress the cough.  Objective: Vitals:   06/24/16 0904 06/24/16 1125 06/24/16 1300 06/24/16 1318  BP: (!) 151/50 (!) 124/99 (!) 130/45 (!) 135/55  Pulse: 80 (!) 54 (!) 55   Resp:  15 15   Temp:  97.7 F (36.5 C)    TempSrc:  Oral    SpO2:  98% 99%   Weight:      Height:        Intake/Output Summary (Last 24 hours) at 06/24/16 1326 Last data filed at 06/24/16 0800  Gross per 24 hour  Intake           126.59 ml  Output                0 ml  Net           126.59 ml   Filed Weights   06/23/16 0845 06/24/16 0428  Weight: 64 kg (141 lb) 61.6 kg (135 lb 12.9 oz)    Examination:  General exam:  Appears calm and comfortable  Respiratory system: Clear to auscultation. Respiratory effort normal. Positive cough dry, equal chest rise, no wheezes Cardiovascular system: IRR. No JVD,  rubs, gallops or clicks. No pedal edema. Gastrointestinal system: Abdomen is nondistended, soft and nontender. No organomegaly or masses felt. Normal bowel sounds heard. Central nervous system: Alert and oriented. No focal neurological deficits. Extremities: Symmetric 5 x 5 power. Skin: No rashes, lesions or ulcers, on limited exam. Psychiatry: Judgement and insight appear normal. Mood & affect appropriate.   Data Reviewed: I have personally reviewed following labs and imaging studies  CBC:  Recent Labs Lab 06/18/16 0906 06/22/16 1227 06/23/16 0908 06/24/16 0454  WBC 7.0 6.1 10.4 6.9  NEUTROABS  --   --  8.2*  --   HGB 10.9* 10.2* 11.1* 10.9*  HCT 34.8* 31.2* 35.3* 34.2*  MCV 81.1 78.4 81.5 82.2  PLT 192 183.0 145* [id number]   Basic Metabolic Panel:  Recent Labs Lab 06/18/16 0906 06/23/16 0908 06/23/16 1451 06/24/16 0454  NA 139 137  --  138  K 3.8 4.0  --  3.2*  CL 101 105  --  101  CO2 30 24  --  27  GLUCOSE 183* 224*  --  183*  BUN 19 17  --  12  CREATININE 1.05* 0.96  --  0.92  CALCIUM 8.8* 8.5*  --  8.2*  MG  --   --  1.9  --   PHOS  --   --  4.2  --    GFR: Estimated Creatinine Clearance: 44.9 mL/min (by C-G formula based on SCr of 0.92 mg/dL). Liver Function Tests: No results for input(s): AST, ALT, ALKPHOS, BILITOT, PROT, ALBUMIN in the last 168 hours. No results for input(s): LIPASE, AMYLASE in the last 168 hours. No results for input(s): AMMONIA in the last 168 hours. Coagulation Profile:  Recent Labs Lab 06/22/16 1227 06/23/16 0908 06/24/16 0454  INR 3.3* 2.75 2.89   Cardiac Enzymes:  Recent Labs Lab 06/18/16 0906 06/23/16 0908 06/23/16 1451 06/23/16 2003 06/24/16 0154  TROPONINI <0.03 0.14* 0.15* 0.10* 0.06*   BNP (last 3 results) No results for input(s):  PROBNP in the last 8760 hours. HbA1C: No results for input(s): HGBA1C in the last 72 hours. CBG: No results for input(s): GLUCAP in the last 168 hours. Lipid Profile: No results for input(s): CHOL, HDL, LDLCALC, TRIG, CHOLHDL, LDLDIRECT in the last 72 hours. Thyroid Function Tests: No results for input(s): TSH, T4TOTAL, FREET4, T3FREE, THYROIDAB in the last 72 hours. Anemia Panel: No results for input(s): VITAMINB12, FOLATE, FERRITIN, TIBC, IRON, RETICCTPCT in the last 72 hours. Sepsis Labs: No results for input(s): PROCALCITON, LATICACIDVEN in the last 168 hours.  Recent Results (from the past 240 hour(s))  MRSA PCR Screening     Status: None   Collection Time: 06/23/16  3:25 PM  Result Value Ref Range Status   MRSA by PCR NEGATIVE NEGATIVE Final    Comment:        The GeneXpert MRSA Assay (FDA approved for NASAL specimens only), is one component of a comprehensive MRSA colonization surveillance program. It is not intended to diagnose MRSA infection nor to guide or monitor treatment for MRSA infections.       Radiology Studies: Dg Chest Portable 1 View  Result Date: 06/23/2016 CLINICAL DATA:  Chest pain, shortness of breath and atrial fibrillation. EXAM: PORTABLE CHEST 1 VIEW COMPARISON:  06/18/2016 FINDINGS: The heart size and mediastinal contours are within normal limits. Large hiatal hernia is again identified. Both lungs are clear. Previous anterior cervical disc fusion noted. IMPRESSION: 1. No acute findings 2. Hiatal hernia. . Electronically Signed   By: Kerby Moors M.D.   On: 06/23/2016 09:51        Scheduled Meds: . atorvastatin  10 mg Oral q1800  . diltiazem  30 mg Oral Q6H  . furosemide  40 mg Intravenous Daily  . gabapentin  100 mg Oral TID  . metoprolol  50 mg Oral BID  . mometasone-formoterol  2 puff Inhalation BID  . pantoprazole  40 mg Oral BID  . potassium chloride  40 mEq Oral Daily  . sodium chloride flush  3 mL Intravenous Q12H  . temazepam   15 mg Oral QHS  . warfarin  2.5 mg Oral Once  . Warfarin - Pharmacist Dosing Inpatient   Does not apply q1800   Continuous Infusions:   LOS: 1 day    Time spent: > 35  Velvet Bathe, MD [facility] Pager [phone]  If 7PM-7AM, please contact night-coverage [email or web address] Password [email or web address] 06/24/2016, 1:26 PM

## 2016-06-24 NOTE — Progress Notes (Signed)
ANTICOAGULATION CONSULT NOTE - Initial Consult  Pharmacy Consult for coumadin Indication: atrial fibrillation  Allergies  Allergen Reactions  . Iodinated Diagnostic Agents Anaphylaxis    IPD dye Info given by patient  . Ioxaglate Anaphylaxis    Info given by patient  . Milk-Related Compounds Anaphylaxis    Lactose intolerance  . Red Dye Anaphylaxis  . Whey     Lactose intolerance  . Darvon [Propoxyphene] Rash  . Hydralazine Anxiety    Facial flushing, pt prefers not to use it.     Patient Measurements: Height: 5\' 2"  (157.5 cm) Weight: 135 lb 12.9 oz (61.6 kg) IBW/kg (Calculated) : 50.1   Vital Signs: Temp: 97.7 F (36.5 C) (12/23 0730) Temp Source: Oral (12/23 0730) BP: 151/50 (12/23 0904) Pulse Rate: 80 (12/23 0904)  Labs:  Recent Labs  06/22/16 1227  06/23/16 0908 06/23/16 1451 06/23/16 2003 06/24/16 0154 06/24/16 0454  HGB 10.2*  --  11.1*  --   --   --  10.9*  HCT 31.2*  --  35.3*  --   --   --  34.2*  PLT 183.0  --  145*  --   --   --  164  LABPROT 35.9*  --  29.7*  --   --   --  30.8*  INR 3.3*  --  2.75  --   --   --  2.89  CREATININE  --   --  0.96  --   --   --  0.92  TROPONINI  --   < > 0.14* 0.15* 0.10* 0.06*  --   < > = values in this interval not displayed.  Estimated Creatinine Clearance: 44.9 mL/min (by C-G formula based on SCr of 0.92 mg/dL).   Medical History: Past Medical History:  Diagnosis Date  . Anemia 2005   Generally microcytic, transfusions in [address], 2012, 02/2015, 05/2015  . Aortic stenosis    Moderate November 2017  . Arthritis   . Broken back 2013   Chronic back pain.   [name] CAD (coronary artery disease)    Cardiac catheterization 2014 - 80% mid RCA and 70% OM managed medically  . CHF (congestive heart failure) ([place])   . Contrast media allergy   . Diastolic heart failure ([place])   . Esophageal cancer ([place]) 05/06/2015   Adenocarcinoma GE junction  . GERD (gastroesophageal reflux disease)   . H/O iron deficiency    05-06-15 iron infusion ([facility])  . HH (hiatus hernia) 2008   Large with associated erosions  . Hypertension   . Paroxysmal atrial fibrillation ([name])   . PONV (postoperative nausea and vomiting)   . Pulmonary emboli ([place]) 2008, 2012  . Stroke ([place])   . Transfusion history    2 units transfused 05-06-15 ([facility])    Medications:  Coumadin 2.5 mg po on Wed, 5 mg other days  Assessment: 75 yo lady admitted with SOB found to be in afib with RVR.  Will continue coumadin therapy.  INR on admission is therapeutic. INR therapeutic this AM   Goal of Therapy:  INR 2-3 Monitor platelets by anticoagulation protocol: Yes   Plan:  Coumadin 2.5 mg po today Daily PT/INR Monitor for bleeding complications  Thanks for allowing pharmacy to be a part of this patient's care.  Maeola Sarah [facility] Clinical Pharmacist

## 2016-06-25 LAB — PROTIME-INR
INR: 3.69
Prothrombin Time: 37.5 seconds — ABNORMAL HIGH (ref 11.4–15.2)

## 2016-06-25 LAB — POTASSIUM: Potassium: 4.6 mmol/L (ref 3.5–5.1)

## 2016-06-25 MED ORDER — DILTIAZEM HCL ER COATED BEADS 120 MG PO CP24
120.0000 mg | ORAL_CAPSULE | Freq: Every day | ORAL | Status: DC
  Administered 2016-06-25 – 2016-06-27 (×3): 120 mg via ORAL
  Filled 2016-06-25 (×3): qty 1

## 2016-06-25 NOTE — Progress Notes (Signed)
[place] for coumadin Indication: atrial fibrillation  Allergies  Allergen Reactions  . Iodinated Diagnostic Agents Anaphylaxis    IPD dye Info given by patient  . Ioxaglate Anaphylaxis    Info given by patient  . Milk-Related Compounds Anaphylaxis    Lactose intolerance  . Red Dye Anaphylaxis  . Whey     Lactose intolerance  . Darvon [Propoxyphene] Rash  . Hydralazine Anxiety    Facial flushing, pt prefers not to use it.     Patient Measurements: Height: 5\' 2"  (157.5 cm) Weight: 136 lb 3.9 oz (61.8 kg) IBW/kg (Calculated) : 50.1   Vital Signs: Temp: 97.8 F (36.6 C) (12/24 0730) Temp Source: Oral (12/24 0730) BP: 176/56 (12/24 0800) Pulse Rate: 65 (12/24 0800)  Labs:  Recent Labs  06/22/16 1227  06/23/16 0908 06/23/16 1451 06/23/16 2003 06/24/16 0154 06/24/16 0454 06/25/16 0528  HGB 10.2*  --  11.1*  --   --   --  10.9*  --   HCT 31.2*  --  35.3*  --   --   --  34.2*  --   PLT 183.0  --  145*  --   --   --  164  --   LABPROT 35.9*  --  29.7*  --   --   --  30.8* 37.5*  INR 3.3*  --  2.75  --   --   --  2.89 3.69  CREATININE  --   --  0.96  --   --   --  0.92  --   TROPONINI  --   < > 0.14* 0.15* 0.10* 0.06*  --   --   < > = values in this interval not displayed.  Estimated Creatinine Clearance: 45 mL/min (by C-G formula based on SCr of 0.92 mg/dL).   Medical History: Past Medical History:  Diagnosis Date  . Anemia 2005   Generally microcytic, transfusions in [address], 2012, 02/2015, 05/2015  . Aortic stenosis    Moderate November 2017  . Arthritis   . Broken back 2013   Chronic back pain.   [name] CAD (coronary artery disease)    Cardiac catheterization 2014 - 80% mid RCA and 70% OM managed medically  . CHF (congestive heart failure) ([place])   . Contrast media allergy   . Diastolic heart failure ([place])   . Esophageal cancer ([place]) 05/06/2015   Adenocarcinoma GE junction  . GERD (gastroesophageal reflux disease)   .  H/O iron deficiency    05-06-15 iron infusion ([facility])  . HH (hiatus hernia) 2008   Large with associated erosions  . Hypertension   . Paroxysmal atrial fibrillation ([name])   . PONV (postoperative nausea and vomiting)   . Pulmonary emboli ([place]) 2008, 2012  . Stroke ([place])   . Transfusion history    2 units transfused 05-06-15 ([facility])    Medications:  Coumadin 2.5 mg po on Wed, 5 mg other days  Assessment: 75 yo lady admitted with SOB found to be in afib with RVR.  Will continue coumadin therapy.  INR on admission is therapeutic. INR supra therapeutic this AM following home regiment  Goal of Therapy:  INR 2-3 Monitor platelets by anticoagulation protocol: Yes   Plan:  No Coumadin today due to elevated INR Daily PT/INR Monitor for bleeding complications  Thanks for allowing pharmacy to be a part of this patient's care.  Maeola Sarah [facility] Clinical Pharmacist

## 2016-06-25 NOTE — Progress Notes (Signed)
PROGRESS NOTE    [name]  [id number] DOB: 1941/01/11 DOA: 06/23/2016 PCP: Mauricio Po, [name]   Brief Narrative:  75 y.o. female with medical history significant of atrial fibrillation on coumadin, pe on coumadin, diastolic HF, anemia, and essential HTN. Who presents with 1 week complaint of shortness of breath. Was seen in the ED roughly 5 days ago and started on prednisone and antibiotic which did not help   Assessment & Plan:    Atrial fibrillation with rapid ventricular response ([place]) - Will transition to oral Cardizem, discussed with nursing - Continue to monitor and step down  Active Problems:   Long term current use of anticoagulant therapy - Coumadin per pharmacy    Chest pain - Elevated troponin was secondary to demand ischemia. No chest pain reported today    GERD (gastroesophageal reflux disease) - Stable currently  Hypokalemia -Replaced orally and resolved    Anemia  - No active bleeding will continue to monitor   DVT prophylaxis: Coumadin Code Status: Full code Family Communication: Discussed directly with patient Disposition Plan: transfer to floor if no new problems overnight consider d/c 12/25   Consultants:   Cardiology   Procedures: None   Antimicrobials: None   Subjective: No acute issues overnight. No new complaints  Objective: Vitals:   06/25/16 0730 06/25/16 0800 06/25/16 1000 06/25/16 1150  BP:  (!) 176/56 (!) 138/43   Pulse:  65 62   Resp:  17 19   Temp: 97.8 F (36.6 C)   97.9 F (36.6 C)  TempSrc: Oral   Axillary  SpO2:  97% 96%   Weight:      Height:        Intake/Output Summary (Last 24 hours) at 06/25/16 1216 Last data filed at 06/25/16 1213  Gross per 24 hour  Intake           490.42 ml  Output              800 ml  Net          -309.58 ml   Filed Weights   06/23/16 0845 06/24/16 0428 06/25/16 0500  Weight: 64 kg (141 lb) 61.6 kg (135 lb 12.9 oz) 61.8 kg (136 lb 3.9 oz)     Examination:  General exam: Appears calm and comfortable  Respiratory system: Clear to auscultation. Respiratory effort normal. Positive cough dry, equal chest rise, no wheezes Cardiovascular system: IRR. No JVD,  rubs, gallops or clicks. No pedal edema. Gastrointestinal system: Abdomen is nondistended, soft and nontender. No organomegaly or masses felt. Normal bowel sounds heard. Central nervous system: Alert and oriented. No focal neurological deficits. Extremities: Symmetric 5 x 5 power. Skin: No rashes, lesions or ulcers, on limited exam. Psychiatry: Judgement and insight appear normal. Mood & affect appropriate.   Data Reviewed: I have personally reviewed following labs and imaging studies  CBC:  Recent Labs Lab 06/22/16 1227 06/23/16 0908 06/24/16 0454  WBC 6.1 10.4 6.9  NEUTROABS  --  8.2*  --   HGB 10.2* 11.1* 10.9*  HCT 31.2* 35.3* 34.2*  MCV 78.4 81.5 82.2  PLT 183.0 145* [id number]   Basic Metabolic Panel:  Recent Labs Lab 06/23/16 0908 06/23/16 1451 06/24/16 0454 06/25/16 1000  NA 137  --  138  --   K 4.0  --  3.2* 4.6  CL 105  --  101  --   CO2 24  --  27  --   GLUCOSE 224*  --  183*  --  BUN 17  --  12  --   CREATININE 0.96  --  0.92  --   CALCIUM 8.5*  --  8.2*  --   MG  --  1.9  --   --   PHOS  --  4.2  --   --    GFR: Estimated Creatinine Clearance: 45 mL/min (by C-G formula based on SCr of 0.92 mg/dL). Liver Function Tests: No results for input(s): AST, ALT, ALKPHOS, BILITOT, PROT, ALBUMIN in the last 168 hours. No results for input(s): LIPASE, AMYLASE in the last 168 hours. No results for input(s): AMMONIA in the last 168 hours. Coagulation Profile:  Recent Labs Lab 06/22/16 1227 06/23/16 0908 06/24/16 0454 06/25/16 0528  INR 3.3* 2.75 2.89 3.69   Cardiac Enzymes:  Recent Labs Lab 06/23/16 0908 06/23/16 1451 06/23/16 2003 06/24/16 0154  TROPONINI 0.14* 0.15* 0.10* 0.06*   BNP (last 3 results) No results for input(s): PROBNP  in the last 8760 hours. HbA1C: No results for input(s): HGBA1C in the last 72 hours. CBG: No results for input(s): GLUCAP in the last 168 hours. Lipid Profile: No results for input(s): CHOL, HDL, LDLCALC, TRIG, CHOLHDL, LDLDIRECT in the last 72 hours. Thyroid Function Tests: No results for input(s): TSH, T4TOTAL, FREET4, T3FREE, THYROIDAB in the last 72 hours. Anemia Panel: No results for input(s): VITAMINB12, FOLATE, FERRITIN, TIBC, IRON, RETICCTPCT in the last 72 hours. Sepsis Labs: No results for input(s): PROCALCITON, LATICACIDVEN in the last 168 hours.  Recent Results (from the past 240 hour(s))  MRSA PCR Screening     Status: None   Collection Time: 06/23/16  3:25 PM  Result Value Ref Range Status   MRSA by PCR NEGATIVE NEGATIVE Final    Comment:        The GeneXpert MRSA Assay (FDA approved for NASAL specimens only), is one component of a comprehensive MRSA colonization surveillance program. It is not intended to diagnose MRSA infection nor to guide or monitor treatment for MRSA infections.      Radiology Studies: No results found.   Scheduled Meds: . atorvastatin  10 mg Oral q1800  . diltiazem  120 mg Oral Daily  . furosemide  40 mg Intravenous Daily  . gabapentin  100 mg Oral TID  . metoprolol  50 mg Oral BID  . mometasone-formoterol  2 puff Inhalation BID  . pantoprazole  40 mg Oral BID  . potassium chloride  40 mEq Oral Daily  . sodium chloride flush  3 mL Intravenous Q12H  . temazepam  15 mg Oral QHS  . Warfarin - Pharmacist Dosing Inpatient   Does not apply q1800   Continuous Infusions:   LOS: 2 days    Time spent: > 35  Velvet Bathe, MD [facility] Pager [phone]  If 7PM-7AM, please contact night-coverage [email or web address] Password [email or web address] 06/25/2016, 12:16 PM

## 2016-06-26 DIAGNOSIS — K219 Gastro-esophageal reflux disease without esophagitis: Secondary | ICD-10-CM

## 2016-06-26 DIAGNOSIS — D500 Iron deficiency anemia secondary to blood loss (chronic): Secondary | ICD-10-CM

## 2016-06-26 DIAGNOSIS — R739 Hyperglycemia, unspecified: Secondary | ICD-10-CM

## 2016-06-26 LAB — CBC
HCT: 36 % (ref 36.0–46.0)
Hemoglobin: 11 g/dL — ABNORMAL LOW (ref 12.0–15.0)
MCH: 25.2 pg — ABNORMAL LOW (ref 26.0–34.0)
MCHC: 30.6 g/dL (ref 30.0–36.0)
MCV: 82.6 fL (ref 78.0–100.0)
PLATELETS: 186 10*3/uL (ref 150–400)
RBC: 4.36 MIL/uL (ref 3.87–5.11)
RDW: 17.3 % — ABNORMAL HIGH (ref 11.5–15.5)
WBC: 5.5 10*3/uL (ref 4.0–10.5)

## 2016-06-26 LAB — PROTIME-INR
INR: 2.45
Prothrombin Time: 27.1 seconds — ABNORMAL HIGH (ref 11.4–15.2)

## 2016-06-26 MED ORDER — WARFARIN SODIUM 5 MG PO TABS
5.0000 mg | ORAL_TABLET | Freq: Once | ORAL | Status: AC
  Administered 2016-06-26: 5 mg via ORAL
  Filled 2016-06-26: qty 1

## 2016-06-26 MED ORDER — HYDROMORPHONE HCL 1 MG/ML IJ SOLN
0.5000 mg | Freq: Once | INTRAMUSCULAR | Status: AC
  Administered 2016-06-26: 0.5 mg via INTRAVENOUS
  Filled 2016-06-26: qty 1

## 2016-06-26 MED ORDER — AMLODIPINE BESYLATE 5 MG PO TABS
2.5000 mg | ORAL_TABLET | Freq: Every day | ORAL | Status: DC
  Administered 2016-06-26 – 2016-06-27 (×2): 2.5 mg via ORAL
  Filled 2016-06-26 (×2): qty 1

## 2016-06-26 MED ORDER — FUROSEMIDE 40 MG PO TABS
40.0000 mg | ORAL_TABLET | Freq: Every day | ORAL | Status: DC
  Administered 2016-06-26 – 2016-06-27 (×2): 40 mg via ORAL
  Filled 2016-06-26 (×2): qty 1

## 2016-06-26 NOTE — Progress Notes (Signed)
Patient ID: [name], female   DOB: 15-Sep-1940, 75 y.o.   MRN: [id number]                                                                PROGRESS NOTE                                                                                                                                                                                                             Patient Demographics:    [name], is a 75 y.o. female, DOB - Nov 04, 1940, [id number]  Admit date - 06/23/2016   Admitting Physician Velvet Bathe, MD  Outpatient Primary MD for the patient is Mauricio Po, [place]  LOS - 3  Outpatient Specialists: Mali Hilty  Chief Complaint  Patient presents with  . Shortness of Breath  . Chest Pain       Brief Narrative  75 y.o. female with medical history significant of atrial fibrillation on coumadin, pe on coumadin, diastolic HF, moderate AS, anemia, and essential HTN. Who presents with 1 week complaint of shortness of breath. Was seen in the ED roughly 5 days ago and started on prednisone and antibiotic which did not help. Patient's condition gradually got worse. She denies any fevers. Did feel like she was warm to touch but denies coughing up any yellow or green sputum consistently. No recent travels reported. No hemoptysis. Nothing the patient is aware of makes it better. Ambulating makes it worse .  ED Course: Patient was found to be in atrial fibrillation with RVR and started on Cardizem drip. Subsequently cardiology consulted and we were consulted for further medical evaluation recommendations   Subjective:    [name] today denies cp, palp, sob, lower ext edema.   Pt is doing better.  Slight discomfort in the bilateral trapezius area but pt declines muscle relaxer.     Assessment  & Plan :    Active Problems:   Long term current use of anticoagulant therapy   Chest pain   GERD (gastroesophageal reflux disease)   Anemia   Atrial fibrillation with rapid ventricular  response ([place])   1.  Afib with RVR,  Moderate AS Rate controlled with metoprolol and cardizem Cont coumadin, pharmacy to dose Appreciate cardiology input  2.  Uncontrolled hypertension Add amlodipine 2.5mg   po qday Transition to oral lasix from IV  3. Hx of esophageal cancer/stomach cancer Please f/u with [facility], pt states has appt  4. Anemia Stable,  Check cbc in am Check ferritin, iron, tibc, b12, folate  5. Hyperglycemia Check hga1c  6. Gerd Cont protonix  7. CAD,  + trop thought by cardiology due to demand ischemia Cont lipitor   Code Status : FULL CODE  Family Communication  : w patient  Disposition Plan  : home  Barriers For Discharge :   Consults  :  cardiology  Procedures  :   DVT Prophylaxis  :  Lovenox -  SCDs   Lab Results  Component Value Date   PLT 186 06/26/2016    Antibiotics  :  none  Anti-infectives    None        Objective:   Vitals:   06/25/16 1935 06/25/16 2025 06/26/16 0601 06/26/16 0738  BP:  (!) 156/54 (!) 170/48   Pulse: 73 76 61   Resp: 18 19 18    Temp:  98.7 F (37.1 C) 97.6 F (36.4 C)   TempSrc:  Oral Oral   SpO2: 92% 95% 97% 98%  Weight:      Height:        Wt Readings from Last 3 Encounters:  06/25/16 61.8 kg (136 lb 3.9 oz)  06/22/16 64 kg (141 lb)  06/18/16 64.4 kg (142 lb)     Intake/Output Summary (Last 24 hours) at 06/26/16 0819 Last data filed at 06/25/16 1737  Gross per 24 hour  Intake              480 ml  Output             1800 ml  Net            -1320 ml     Physical Exam  Awake Alert, Oriented X 3, No new F.N deficits, Normal affect [place].AT,PERRAL Supple Neck,No JVD, No cervical lymphadenopathy appriciated.  Symmetrical Chest wall movement, Good air movement bilaterally, CTAB RRR,S1, S2, 2/6 sem rusb,  No Parasternal Heave +ve B.Sounds, Abd Soft, No tenderness, No organomegaly appriciated, No rebound - guarding or rigidity. No Cyanosis, Clubbing or edema, No new Rash or bruise      Data Review:    CBC  Recent Labs Lab 06/22/16 1227 06/23/16 0908 06/24/16 0454 06/26/16 0612  WBC 6.1 10.4 6.9 5.5  HGB 10.2* 11.1* 10.9* 11.0*  HCT 31.2* 35.3* 34.2* 36.0  PLT 183.0 145* 164 186  MCV 78.4 81.5 82.2 82.6  MCH  --  25.6* 26.2 25.2*  MCHC 32.7 31.4 31.9 30.6  RDW 20.3* 17.8* 18.1* 17.3*  LYMPHSABS  --  1.2  --   --   MONOABS  --  1.0  --   --   EOSABS  --  0.0  --   --   BASOSABS  --  0.0  --   --     Chemistries   Recent Labs Lab 06/23/16 0908 06/23/16 1451 06/24/16 0454 06/25/16 1000  NA 137  --  138  --   K 4.0  --  3.2* 4.6  CL 105  --  101  --   CO2 24  --  27  --   GLUCOSE 224*  --  183*  --   BUN 17  --  12  --   CREATININE 0.96  --  0.92  --   CALCIUM 8.5*  --  8.2*  --  MG  --  1.9  --   --    ------------------------------------------------------------------------------------------------------------------ No results for input(s): CHOL, HDL, LDLCALC, TRIG, CHOLHDL, LDLDIRECT in the last 72 hours.  No results found for: HGBA1C ------------------------------------------------------------------------------------------------------------------ No results for input(s): TSH, T4TOTAL, T3FREE, THYROIDAB in the last 72 hours.  Invalid input(s): FREET3 ------------------------------------------------------------------------------------------------------------------ No results for input(s): VITAMINB12, FOLATE, FERRITIN, TIBC, IRON, RETICCTPCT in the last 72 hours.  Coagulation profile  Recent Labs Lab 06/22/16 1227 06/23/16 0908 06/24/16 0454 06/25/16 0528 06/26/16 0612  INR 3.3* 2.75 2.89 3.69 2.45    No results for input(s): DDIMER in the last 72 hours.  Cardiac Enzymes  Recent Labs Lab 06/23/16 1451 06/23/16 2003 06/24/16 0154  TROPONINI 0.15* 0.10* 0.06*   ------------------------------------------------------------------------------------------------------------------    Component Value Date/Time   BNP 739.0 (H)  06/23/2016 0908    Inpatient Medications  Scheduled Meds: . atorvastatin  10 mg Oral q1800  . diltiazem  120 mg Oral Daily  . furosemide  40 mg Intravenous Daily  . gabapentin  100 mg Oral TID  . metoprolol  50 mg Oral BID  . mometasone-formoterol  2 puff Inhalation BID  . pantoprazole  40 mg Oral BID  . potassium chloride  40 mEq Oral Daily  . sodium chloride flush  3 mL Intravenous Q12H  . temazepam  15 mg Oral QHS  . Warfarin - Pharmacist Dosing Inpatient   Does not apply q1800   Continuous Infusions: PRN Meds:.sodium chloride, acetaminophen, albuterol, benzonatate, sodium chloride flush, traMADol  Micro Results Recent Results (from the past 240 hour(s))  MRSA PCR Screening     Status: None   Collection Time: 06/23/16  3:25 PM  Result Value Ref Range Status   MRSA by PCR NEGATIVE NEGATIVE Final    Comment:        The GeneXpert MRSA Assay (FDA approved for NASAL specimens only), is one component of a comprehensive MRSA colonization surveillance program. It is not intended to diagnose MRSA infection nor to guide or monitor treatment for MRSA infections.     Radiology Reports Dg Chest 2 View  Result Date: 05/27/2016 CLINICAL DATA:  75 year old female with a history of chest pain and shortness of breath EXAM: CHEST  2 VIEW COMPARISON:  04/19/2016, 09/05/2015, CT chest 05/07/2015 FINDINGS: Cardiomediastinal silhouette unchanged. Double density over the lower mediastinum is unchanged. Calcifications of the aortic arch. Blunting of the sulcus on the lateral view is unchanged. No pneumothorax.  No confluent airspace disease. Surgical changes of the cervical region. No displaced fracture. Degenerative changes of the thoracic spine. IMPRESSION: Chronic lung changes without evidence of acute cardiopulmonary disease. Re- demonstration of large hiatal hernia. Aortic atherosclerosis. Signed, Dulcy Fanny. Earleen Newport, DO [facility] Radiology  Electronically Signed   By: Corrie Mckusick D.O.   On: 05/27/2016 15:17   Dg Chest Portable 1 View  Result Date: 06/23/2016 CLINICAL DATA:  Chest pain, shortness of breath and atrial fibrillation. EXAM: PORTABLE CHEST 1 VIEW COMPARISON:  06/18/2016 FINDINGS: The heart size and mediastinal contours are within normal limits. Large hiatal hernia is again identified. Both lungs are clear. Previous anterior cervical disc fusion noted. IMPRESSION: 1. No acute findings 2. Hiatal hernia. . Electronically Signed   By: Kerby Moors M.D.   On: 06/23/2016 09:51   Dg Chest Portable 1 View  Result Date: 06/18/2016 CLINICAL DATA:  Shortness of breath, chest pain EXAM: PORTABLE CHEST 1 VIEW COMPARISON:  05/29/2016 FINDINGS: Heart is borderline in size. No edema. Minimal right  base atelectasis. No effusions or acute bony abnormality. IMPRESSION: Minimal right base atelectasis. Electronically Signed   By: Rolm Baptise M.D.   On: 06/18/2016 09:04   Dg Chest Port 1 View  Result Date: 05/29/2016 CLINICAL DATA:  Worsening shortness of breath EXAM: PORTABLE CHEST 1 VIEW COMPARISON:  05/27/2016 FINDINGS: Surgical changes of the cervical spine. Mild cardiomegaly. There is mild central vascular congestion. Mild interstitial prominence suggests mild perihilar edema. No effusion. Atherosclerosis. No pneumothorax. Lower mediastinal opacity suspected to represent hiatal hernia. IMPRESSION: Mild cardiomegaly with mild central vascular congestion and mild diffuse interstitial prominence, suggesting mild edema. Electronically Signed   By: Donavan Foil M.D.   On: 05/29/2016 20:11    Time Spent in minutes  30   Jani Gravel M.D on 06/26/2016 at 8:19 AM  Between 7am to 7pm - Pager - [phone]  After 7pm go to [email or web address] - password [facility]  [facility] -  Office  [phone]

## 2016-06-26 NOTE — Progress Notes (Signed)
[place] for coumadin Indication: atrial fibrillation  Allergies  Allergen Reactions  . Iodinated Diagnostic Agents Anaphylaxis    IPD dye Info given by patient  . Ioxaglate Anaphylaxis    Info given by patient  . Milk-Related Compounds Anaphylaxis    Lactose intolerance  . Red Dye Anaphylaxis  . Whey     Lactose intolerance  . Darvon [Propoxyphene] Rash  . Hydralazine Anxiety    Facial flushing, pt prefers not to use it.     Patient Measurements: Height: 5\' 2"  (157.5 cm) Weight: 136 lb 3.9 oz (61.8 kg) IBW/kg (Calculated) : 50.1   Vital Signs: Temp: 97.6 F (36.4 C) (12/25 0601) Temp Source: Oral (12/25 0601) BP: 170/48 (12/25 0601) Pulse Rate: 61 (12/25 0601)  Labs:  Recent Labs  06/23/16 0908 06/23/16 1451 06/23/16 2003 06/24/16 0154 06/24/16 0454 06/25/16 0528 06/26/16 0612  HGB 11.1*  --   --   --  10.9*  --  11.0*  HCT 35.3*  --   --   --  34.2*  --  36.0  PLT 145*  --   --   --  164  --  186  LABPROT 29.7*  --   --   --  30.8* 37.5* 27.1*  INR 2.75  --   --   --  2.89 3.69 2.45  CREATININE 0.96  --   --   --  0.92  --   --   TROPONINI 0.14* 0.15* 0.10* 0.06*  --   --   --     Estimated Creatinine Clearance: 45 mL/min (by C-G formula based on SCr of 0.92 mg/dL).   Medical History: Past Medical History:  Diagnosis Date  . Anemia 2005   Generally microcytic, transfusions in [address], 2012, 02/2015, 05/2015  . Aortic stenosis    Moderate November 2017  . Arthritis   . Broken back 2013   Chronic back pain.   [name] CAD (coronary artery disease)    Cardiac catheterization 2014 - 80% mid RCA and 70% OM managed medically  . CHF (congestive heart failure) ([place])   . Contrast media allergy   . Diastolic heart failure ([place])   . Esophageal cancer ([place]) 05/06/2015   Adenocarcinoma GE junction  . GERD (gastroesophageal reflux disease)   . H/O iron deficiency    05-06-15 iron infusion ([facility])  . HH (hiatus hernia) 2008   Large with associated erosions  . Hypertension   . Paroxysmal atrial fibrillation ([name])   . PONV (postoperative nausea and vomiting)   . Pulmonary emboli ([place]) 2008, 2012  . Stroke ([place])   . Transfusion history    2 units transfused 05-06-15 ([facility])    Medications:  Coumadin 2.5 mg po on Wed, 5 mg other days  Assessment: 75 yo lady admitted with SOB found to be in afib with RVR.  Will continue coumadin therapy.  INR today is therapeutic  Goal of Therapy:  INR 2-3 Monitor platelets by anticoagulation protocol: Yes   Plan:  Coumadin 5mg  po today Daily PT/INR Monitor for bleeding complications  Thanks for allowing pharmacy to be a part of this patient's care.  Excell Seltzer, PharmD Clinical Pharmacist

## 2016-06-27 ENCOUNTER — Other Ambulatory Visit: Payer: Self-pay | Admitting: Pharmacist

## 2016-06-27 ENCOUNTER — Ambulatory Visit: Payer: Self-pay | Admitting: Pharmacist

## 2016-06-27 LAB — IRON AND TIBC
IRON: 23 ug/dL — AB (ref 28–170)
SATURATION RATIOS: 8 % — AB (ref 10.4–31.8)
TIBC: 298 ug/dL (ref 250–450)
UIBC: 275 ug/dL

## 2016-06-27 LAB — COMPREHENSIVE METABOLIC PANEL
ALBUMIN: 3.2 g/dL — AB (ref 3.5–5.0)
ALT: 28 U/L (ref 14–54)
ANION GAP: 7 (ref 5–15)
AST: 16 U/L (ref 15–41)
Alkaline Phosphatase: 78 U/L (ref 38–126)
BUN: 18 mg/dL (ref 6–20)
CHLORIDE: 102 mmol/L (ref 101–111)
CO2: 29 mmol/L (ref 22–32)
Calcium: 8.7 mg/dL — ABNORMAL LOW (ref 8.9–10.3)
Creatinine, Ser: 1 mg/dL (ref 0.44–1.00)
GFR calc non Af Amer: 53 mL/min — ABNORMAL LOW (ref >60)
Glucose, Bld: 165 mg/dL — ABNORMAL HIGH (ref 65–99)
Potassium: 4.2 mmol/L (ref 3.5–5.1)
SODIUM: 138 mmol/L (ref 135–145)
Total Bilirubin: 0.4 mg/dL (ref 0.3–1.2)
Total Protein: 5.9 g/dL — ABNORMAL LOW (ref 6.5–8.1)

## 2016-06-27 LAB — PROTIME-INR
INR: 1.5
PROTHROMBIN TIME: 18.3 s — AB (ref 11.4–15.2)

## 2016-06-27 LAB — CBC
HCT: 35.7 % — ABNORMAL LOW (ref 36.0–46.0)
HEMOGLOBIN: 11.2 g/dL — AB (ref 12.0–15.0)
MCH: 25.9 pg — AB (ref 26.0–34.0)
MCHC: 31.4 g/dL (ref 30.0–36.0)
MCV: 82.6 fL (ref 78.0–100.0)
PLATELETS: 191 10*3/uL (ref 150–400)
RBC: 4.32 MIL/uL (ref 3.87–5.11)
RDW: 17.7 % — ABNORMAL HIGH (ref 11.5–15.5)
WBC: 5.9 10*3/uL (ref 4.0–10.5)

## 2016-06-27 LAB — VITAMIN B12: VITAMIN B 12: 924 pg/mL — AB (ref 180–914)

## 2016-06-27 LAB — FERRITIN: Ferritin: 29 ng/mL (ref 11–307)

## 2016-06-27 MED ORDER — AMLODIPINE BESYLATE 2.5 MG PO TABS: 2.5000 mg | ORAL_TABLET | Freq: Every day | ORAL | 0 refills | Status: DC

## 2016-06-27 MED ORDER — WARFARIN SODIUM 5 MG PO TABS: 5.0000 mg | ORAL_TABLET | Freq: Once | ORAL | Status: DC

## 2016-06-27 MED ORDER — DILTIAZEM HCL ER COATED BEADS 120 MG PO CP24: 120.0000 mg | ORAL_CAPSULE | Freq: Every day | ORAL | 0 refills | Status: DC

## 2016-06-27 NOTE — Discharge Summary (Signed)
Physician Discharge Summary  [name] [id number] DOB: [birth year] DOA: 06/23/2016  PCP: Mauricio Po, [name]  Admit date: 06/23/2016 Discharge date: 06/27/2016  Time spent: > 35 minutes  Recommendations for Outpatient Follow-up:  1. Monitor INR levels and adjust Coumadin appropriately 2. Monitor blood pressures and adjust blood pressure medication accordingly  Discharge Diagnoses:  Active Problems:   Long term current use of anticoagulant therapy   Chest pain   GERD (gastroesophageal reflux disease)   Anemia   Atrial fibrillation with RVR ([name])   Hyperglycemia   Discharge Condition: stable  Diet recommendation: Heart healthy  Filed Weights   06/23/16 0845 06/24/16 0428 06/25/16 0500  Weight: 64 kg (141 lb) 61.6 kg (135 lb 12.9 oz) 61.8 kg (136 lb 3.9 oz)    History of present illness:  75 y.o. female with medical history significant of atrial fibrillation on coumadin, pe on coumadin, diastolic HF, anemia, and essential HTN. Who presents with 1 week complaint of shortness of breath. Was seen in the ED roughly 5 days ago and started on prednisone and antibiotic which did not help.   Pt found to be in atrial fibrillation with rvr  Hospital Course:  Atrial fibrillation with RVR -Patient required Cardizem drip initially. Was transitioned to step down unit. With improvement in condition was switched over to oral Cardizem in conjunction with beta blocker. Her condition improved  Procedures:  None  Consultations:  Cardiology  Discharge Exam: Vitals:   06/27/16 0523 06/27/16 0853  BP: (!) 122/53 (!) 157/59  Pulse: 60 64  Resp: 18   Temp: 97.6 F (36.4 C)     General: Pt in nad, alert and awake Cardiovascular: Irr, no rubs Respiratory: no increased wob, no wheezes  Discharge Instructions   Discharge Instructions    Ambulatory referral to Physical Therapy    Complete by:  As directed    Diet - low sodium heart healthy    Complete by:  As directed     Discharge instructions    Complete by:  As directed    Please follow-up with your primary care physician next one or 2 weeks. Continue routine evaluation for your INR. He has been started on Cardizem. Please plan on seeing your cardiologist within the next week   Increase activity slowly    Complete by:  As directed      Current Discharge Medication List    START taking these medications   Details  diltiazem (CARDIZEM CD) 120 MG 24 hr capsule Take 1 capsule (120 mg total) by mouth daily. Qty: 30 capsule, Refills: 0      CONTINUE these medications which have NOT CHANGED   Details  acetaminophen (TYLENOL) 500 MG tablet Take 500-1,000 mg by mouth daily as needed for mild pain or moderate pain.     albuterol (PROVENTIL HFA;VENTOLIN HFA) 108 (90 Base) MCG/ACT inhaler Inhale 2 puffs into the lungs every 4 (four) hours as needed for wheezing or shortness of breath. Qty: 1 Inhaler, Refills: 1    budesonide-formoterol (SYMBICORT) 160-4.5 MCG/ACT inhaler Inhale 2 puffs into the lungs 2 (two) times daily. Qty: 1 Inhaler, Refills: 0   Associated Diagnoses: Acute bronchitis, unspecified organism    cyanocobalamin (,VITAMIN B-12,) 1000 MCG/ML injection IM daily 11/29-12/3, then weekly x4, then monthly. Qty: 9 mL, Refills: 0    furosemide (LASIX) 40 MG tablet Take 1 tablet (40 mg total) by mouth daily. Qty: 30 tablet, Refills: 0    gabapentin (NEURONTIN) 100 MG capsule TAKE 1 CAPSULE BY  MOUTH THREE TIMES A DAY. Qty: 90 capsule, Refills: 3    metoprolol (LOPRESSOR) 50 MG tablet TAKE 1 TABLET BY MOUTH TWICE DAILY. Qty: 180 tablet, Refills: 0    pantoprazole (PROTONIX) 40 MG tablet TAKE 1 TABLET BY MOUTH TWICE DAILY. Qty: 60 tablet, Refills: 0    Pediatric Multivitamins-Iron (FLINTSTONES PLUS IRON) chewable tablet Chew 2 tablets by mouth daily. Qty: 60 tablet, Refills: 0    simvastatin (ZOCOR) 20 MG tablet TAKE (1) TABLET BY MOUTH AT BEDTIME. Qty: 30 tablet, Refills: 0    temazepam  (RESTORIL) 15 MG capsule TAKE 1 CAPSULE BY MOUTH AT BEDTIME. Qty: 30 capsule, Refills: 2    traMADol (ULTRAM) 50 MG tablet Take 1 tablet (50 mg total) by mouth every 8 (eight) hours. Qty: 90 tablet, Refills: 0    warfarin (COUMADIN) 5 MG tablet TAKE 1/2 TABLET BY MOUTH ON WEDNESDAY AND 1 TABLET ALL OTHER DAYS. Qty: 90 tablet, Refills: 0      STOP taking these medications     cloNIDine (CATAPRES) 0.1 MG tablet      levofloxacin (LEVAQUIN) 500 MG tablet      NONFORMULARY OR COMPOUNDED ITEM      valsartan (DIOVAN) 320 MG tablet      amLODipine (NORVASC) 10 MG tablet      doxycycline (VIBRAMYCIN) 100 MG capsule      isosorbide mononitrate (IMDUR) 30 MG 24 hr tablet      predniSONE (DELTASONE) 20 MG tablet        Allergies  Allergen Reactions  . Iodinated Diagnostic Agents Anaphylaxis    IPD dye Info given by patient  . Ioxaglate Anaphylaxis    Info given by patient  . Milk-Related Compounds Anaphylaxis    Lactose intolerance  . Red Dye Anaphylaxis  . Whey     Lactose intolerance  . Darvon [Propoxyphene] Rash  . Hydralazine Anxiety    Facial flushing, pt prefers not to use it.       The results of significant diagnostics from this hospitalization (including imaging, microbiology, ancillary and laboratory) are listed below for reference.    Significant Diagnostic Studies: Dg Chest Portable 1 View  Result Date: 06/23/2016 CLINICAL DATA:  Chest pain, shortness of breath and atrial fibrillation. EXAM: PORTABLE CHEST 1 VIEW COMPARISON:  06/18/2016 FINDINGS: The heart size and mediastinal contours are within normal limits. Large hiatal hernia is again identified. Both lungs are clear. Previous anterior cervical disc fusion noted. IMPRESSION: 1. No acute findings 2. Hiatal hernia. . Electronically Signed   By: Kerby Moors M.D.   On: 06/23/2016 09:51   Dg Chest Portable 1 View  Result Date: 06/18/2016 CLINICAL DATA:  Shortness of breath, chest pain EXAM: PORTABLE  CHEST 1 VIEW COMPARISON:  05/29/2016 FINDINGS: Heart is borderline in size. No edema. Minimal right base atelectasis. No effusions or acute bony abnormality. IMPRESSION: Minimal right base atelectasis. Electronically Signed   By: Rolm Baptise M.D.   On: 06/18/2016 09:04   Dg Chest Port 1 View  Result Date: 05/29/2016 CLINICAL DATA:  Worsening shortness of breath EXAM: PORTABLE CHEST 1 VIEW COMPARISON:  05/27/2016 FINDINGS: Surgical changes of the cervical spine. Mild cardiomegaly. There is mild central vascular congestion. Mild interstitial prominence suggests mild perihilar edema. No effusion. Atherosclerosis. No pneumothorax. Lower mediastinal opacity suspected to represent hiatal hernia. IMPRESSION: Mild cardiomegaly with mild central vascular congestion and mild diffuse interstitial prominence, suggesting mild edema. Electronically Signed   By: Donavan Foil M.D.   On: 05/29/2016 20:11  Microbiology: Recent Results (from the past 240 hour(s))  MRSA PCR Screening     Status: None   Collection Time: 06/23/16  3:25 PM  Result Value Ref Range Status   MRSA by PCR NEGATIVE NEGATIVE Final    Comment:        The GeneXpert MRSA Assay (FDA approved for NASAL specimens only), is one component of a comprehensive MRSA colonization surveillance program. It is not intended to diagnose MRSA infection nor to guide or monitor treatment for MRSA infections.      Labs: Basic Metabolic Panel:  Recent Labs Lab 06/23/16 0908 06/23/16 1451 06/24/16 0454 06/25/16 1000 06/27/16 0415  NA 137  --  138  --  138  K 4.0  --  3.2* 4.6 4.2  CL 105  --  101  --  102  CO2 24  --  27  --  29  GLUCOSE 224*  --  183*  --  165*  BUN 17  --  12  --  18  CREATININE 0.96  --  0.92  --  1.00  CALCIUM 8.5*  --  8.2*  --  8.7*  MG  --  1.9  --   --   --   PHOS  --  4.2  --   --   --    Liver Function Tests:  Recent Labs Lab 06/27/16 0415  AST 16  ALT 28  ALKPHOS 78  BILITOT 0.4  PROT 5.9*   ALBUMIN 3.2*   No results for input(s): LIPASE, AMYLASE in the last 168 hours. No results for input(s): AMMONIA in the last 168 hours. CBC:  Recent Labs Lab 06/22/16 1227 06/23/16 0908 06/24/16 0454 06/26/16 0612 06/27/16 0415  WBC 6.1 10.4 6.9 5.5 5.9  NEUTROABS  --  8.2*  --   --   --   HGB 10.2* 11.1* 10.9* 11.0* 11.2*  HCT 31.2* 35.3* 34.2* 36.0 35.7*  MCV 78.4 81.5 82.2 82.6 82.6  PLT 183.0 145* 164 186 191   Cardiac Enzymes:  Recent Labs Lab 06/23/16 0908 06/23/16 1451 06/23/16 2003 06/24/16 0154  TROPONINI 0.14* 0.15* 0.10* 0.06*   BNP: BNP (last 3 results)  Recent Labs  06/23/16 0908  BNP 739.0*    ProBNP (last 3 results) No results for input(s): PROBNP in the last 8760 hours.  CBG: No results for input(s): GLUCAP in the last 168 hours.  Signed:  Velvet Bathe MD.  [facility] 06/27/2016, 11:52 AM

## 2016-06-27 NOTE — Care Management Important Message (Signed)
Important Message  Patient Details  Name: [name] MRN: [id number] Date of Birth: 03/13/1941   Medicare Important Message Given:  Yes    Sherald Barge, RN 06/27/2016, 9:56 AM

## 2016-06-27 NOTE — Care Management Note (Signed)
Case Management Note  Patient Details  Name: SEYMONE BLEW MRN: [id number] Date of Birth: [birth year]  Subjective/Objective:                  Pt admitted with a-fib with RVR. Pt on Eliquis PTA. She is from home, lives with her son who works during [employer] day. Pt is ind with ADL's. She has PCP, drives herself to appointments. She has insurance with drug coverage and no diffiuclty affording medications. She was referred to OP PT last admission and says she was not contacted. CM will send another referral and f/u with call. Pt discharging home today. Son will transport pt home. No DME needs.    Action/Plan: OP PT referral. No other CM needs.   Expected Discharge Date:  06/26/16               Expected Discharge Plan:  Home/Self Care  In-House Referral:  NA  Discharge planning Services  CM Consult  Post Acute Care Choice:  NA Choice offered to:  NA  Status of Service:  Completed, signed off  Sherald Barge, RN 06/27/2016, 9:57 AM

## 2016-06-27 NOTE — Progress Notes (Signed)
Patient discharged home. IV removed and site intact. Patient discharged with all prescriptions and personal belongings.  

## 2016-06-27 NOTE — Progress Notes (Signed)
Patient discharged home prior to cardiology consultation. Afib with RVR well managed by hospitalist team and patient now rate controlled. No indication for cardiology inpatient evaluation at this time   Zandra Abts MD

## 2016-06-27 NOTE — Patient Outreach (Signed)
[facility]) Care Management  06/27/2016  [name] 1941/05/02 [id number]  Home visit was scheduled this afternoon with patient.  Upon chart review noted patient was in process of being discharged from [facility].  Phone call to patient, HIPAA details verified.  Patient reports she is not at home yet and not sure when she will be.    Patient reports still interested in [facility] Pharmacist home visit.   Plan:  Home visit rescheduled with patient given her recent discharge and unavailability today.   Karrie Meres, PharmD, [place] [phone]

## 2016-06-27 NOTE — Progress Notes (Signed)
[place] for coumadin Indication: atrial fibrillation  Allergies  Allergen Reactions  . Iodinated Diagnostic Agents Anaphylaxis    IPD dye Info given by patient  . Ioxaglate Anaphylaxis    Info given by patient  . Milk-Related Compounds Anaphylaxis    Lactose intolerance  . Red Dye Anaphylaxis  . Whey     Lactose intolerance  . Darvon [Propoxyphene] Rash  . Hydralazine Anxiety    Facial flushing, pt prefers not to use it.     Patient Measurements: Height: 5\' 2"  (157.5 cm) Weight: 136 lb 3.9 oz (61.8 kg) IBW/kg (Calculated) : 50.1   Vital Signs: Temp: 97.6 F (36.4 C) (12/26 0523) Temp Source: Oral (12/26 0523) BP: 157/59 (12/26 0853) Pulse Rate: 64 (12/26 0853)  Labs:  Recent Labs  06/25/16 0528 06/26/16 0612 06/27/16 0415  HGB  --  11.0* 11.2*  HCT  --  36.0 35.7*  PLT  --  186 191  LABPROT 37.5* 27.1* 18.3*  INR 3.69 2.45 1.50  CREATININE  --   --  1.00    Estimated Creatinine Clearance: 41.4 mL/min (by C-G formula based on SCr of 1 mg/dL).   Medical History: Past Medical History:  Diagnosis Date  . Anemia 2005   Generally microcytic, transfusions in [address], 2012, 02/2015, 05/2015  . Aortic stenosis    Moderate November 2017  . Arthritis   . Broken back 2013   Chronic back pain.   [name] CAD (coronary artery disease)    Cardiac catheterization 2014 - 80% mid RCA and 70% OM managed medically  . CHF (congestive heart failure) ([place])   . Contrast media allergy   . Diastolic heart failure ([place])   . Esophageal cancer ([place]) 05/06/2015   Adenocarcinoma GE junction  . GERD (gastroesophageal reflux disease)   . H/O iron deficiency    05-06-15 iron infusion ([facility])  . HH (hiatus hernia) 2008   Large with associated erosions  . Hypertension   . Paroxysmal atrial fibrillation ([name])   . PONV (postoperative nausea and vomiting)   . Pulmonary emboli ([place]) 2008, 2012  . Stroke ([place])   . Transfusion history    2 units  transfused 05-06-15 ([facility])    Medications:  Coumadin 2.5 mg po on Wed, 5 mg other days  Assessment: 75 yo lady admitted with SOB found to be in afib with RVR.  INR today is subtherapeutic  Goal of Therapy:  INR 2-3 Monitor platelets by anticoagulation protocol: Yes   Plan:  Coumadin 5mg  po today Daily PT/INR Monitor for bleeding complications  Thanks for allowing pharmacy to be a part of this patient's care.  Excell Seltzer, PharmD Clinical Pharmacist

## 2016-06-28 ENCOUNTER — Telehealth: Payer: Self-pay | Admitting: *Deleted

## 2016-06-28 ENCOUNTER — Other Ambulatory Visit: Payer: Self-pay | Admitting: Pharmacist

## 2016-06-28 LAB — FOLATE RBC
FOLATE, RBC: 1413 ng/mL (ref >498)
Folate, Hemolysate: 494.5 ng/mL
Hematocrit: 35 % (ref 34.0–46.6)

## 2016-06-28 LAB — HEMOGLOBIN A1C
HEMOGLOBIN A1C: 6.7 % — AB (ref 4.8–5.6)
Mean Plasma Glucose: 146 mg/dL

## 2016-06-28 NOTE — Telephone Encounter (Signed)
Called pt to make TCM hosp f/u no answer LMOM RTC...[name]

## 2016-06-28 NOTE — Patient Outreach (Signed)
[facility]) Care Management  [place]   06/28/2016  SHAAKIRA MOHRMAN [birth year] [id number]  This is a late entry for 06/28/16.    Subjective:  Patient was referred to [place] by PCP, Toniann Ket, [name] for medication assistance.  Home visit with patient, also present was [place] PharmD, Alwyn Ren.    Patient reports she has multiple questions about which medications she should take following her recent hospital discharge from [facility].  She reports she keeps her daily medications in bag and uses a weekly pill box with am and pm slots on it.    She also had a bag full of medication that was old, and she had separated her medications that hospital discontinued on her 06/27/16 discharge.  Patient stated she wasn't sure if she wanted to "get rid of her old medications."    Patient has a PMH significant for atrial fibrillation, with oral anti-coagulation on warfarin, history of PE, hypertension, anemia.    Patient was admitted to [facility] 12/22-12/26/17.  Objective:   Encounter Medications: Outpatient Encounter Prescriptions as of 06/28/2016  Medication Sig  . acetaminophen (TYLENOL) 500 MG tablet Take 500-1,000 mg by mouth daily as needed for mild pain or moderate pain.   [name] albuterol (PROVENTIL HFA;VENTOLIN HFA) 108 (90 Base) MCG/ACT inhaler Inhale 2 puffs into the lungs every 4 (four) hours as needed for wheezing or shortness of breath.  . budesonide-formoterol (SYMBICORT) 160-4.5 MCG/ACT inhaler Inhale 2 puffs into the lungs 2 (two) times daily.  . cyanocobalamin (,VITAMIN B-12,) 1000 MCG/ML injection IM daily 11/29-12/3, then weekly x4, then monthly.  . diltiazem (CARDIZEM CD) 120 MG 24 hr capsule Take 1 capsule (120 mg total) by mouth daily.  . furosemide (LASIX) 40 MG tablet Take 1 tablet (40 mg total) by mouth daily.  [name] gabapentin (NEURONTIN) 100 MG capsule TAKE 1 CAPSULE BY MOUTH THREE TIMES A DAY.  . metoprolol (LOPRESSOR) 50 MG tablet TAKE  1 TABLET BY MOUTH TWICE DAILY.  . pantoprazole (PROTONIX) 40 MG tablet TAKE 1 TABLET BY MOUTH TWICE DAILY.  [name] Pediatric Multivitamins-Iron (FLINTSTONES PLUS IRON) chewable tablet Chew 2 tablets by mouth daily.  . simvastatin (ZOCOR) 20 MG tablet TAKE (1) TABLET BY MOUTH AT BEDTIME.  . temazepam (RESTORIL) 15 MG capsule TAKE 1 CAPSULE BY MOUTH AT BEDTIME.  . traMADol (ULTRAM) 50 MG tablet Take 1 tablet (50 mg total) by mouth every 8 (eight) hours.  [name] warfarin (COUMADIN) 5 MG tablet TAKE 1/2 TABLET BY MOUTH ON WEDNESDAY AND 1 TABLET ALL OTHER DAYS.   No facility-administered encounter medications on file as of 06/28/2016.     Functional Status: In your present state of health, do you have any difficulty performing the following activities: 06/23/2016 05/27/2016  Hearing? N Y  Vision? N N  Difficulty concentrating or making decisions? N N  Walking or climbing stairs? Y N  Dressing or bathing? N N  Doing errands, shopping? N N  Some recent data might be hidden    Fall/Depression Screening: No flowsheet data found.  Assessment:  Medication discrepancy:   -Furosemide---patient has conflicting directions, 0000000 Furosemide 40 mg daily filled, written by Dr Sarajane Jews and on 05/16/16 Furosemide 40 mg one to two tabs daily filled, written by Dr Alma Friendly.   Suggest clarification of dosage as patient states she takes on or two tabs daily per her report.    Medication review by comparing patient's prescriptions with hospital discharge summary from 06/27/16:  Drugs sorted by system:  Neurologic/Psychologic: -temazepam   Cardiovascular: -diltiazem---started on 06/27/16 discharge  -furosemide -metoprolol tartrate  -simvastatin -warfarin   Pulmonary/Allergy: -albuterol sulfate HFA -Symbicort   Gastrointestinal: -pantoprazole   Pain: -acetaminophen prn -tramadol   Vitamins/Minerals: -cyanocobalamin injection -flinstones multivitamin   Miscellaneous: -gabapentin   The  following medications were discontinued per 06/27/16 hospital discharge medication list:  -clonidine -levofloxacin -valsartan -amlodipine -doxycycline -isosorbide mononitrate -prednisone  Patient is also taking the following medications that are not on her active medication list: -promethazine 12.5 mg as needed--last filled 07/2012 -tizanidine 4 mg as needed---last filled in 2014 -meclizine 25 mg as needed---last filled 08/2014  Patient had multiple old prescriptions including: -lisinopril -butalbital/acetaminophen/caffeine -metoclopramide -valacyclovir -valsartan/hydrochlorothiazide  Other issues: -Patient was given a 30 day supply of diltiazem on 06/27/16 discharge---she will need a refill authorization if she is to continue this medication.   Drug interaction:  It is recommended to not exceed 10 mg simvastatin daily with concomitant diltiazem.  Suggest dose decrease of simvastatin or an alternative statin be used.    Patient education: -Counseled patient on importance of only using medications prescribed by her current medical prescribers.  -Counseled patient on importance of not using expired medication and proper disposal of medications, such as taking to her local police department for disposal. (patient wished to not dispose of old medication at time of home visit).    Patient was recently discharged from hospital and all medications have been reviewed.   Medication management: New 7 day pill planner provided to patient and reviewed filling it with patient. Discussed potential adherence aides such as monthly blister packaging with patient as well.    Plan:  Will route this note to PCP.   Will plan to call patient in ~2 weeks to follow-up with her.   Karrie Meres, PharmD, [place] [phone]

## 2016-07-04 ENCOUNTER — Encounter: Payer: Self-pay | Admitting: Pharmacist

## 2016-07-05 ENCOUNTER — Encounter (INDEPENDENT_AMBULATORY_CARE_PROVIDER_SITE_OTHER): Payer: Self-pay | Admitting: Internal Medicine

## 2016-07-05 NOTE — Telephone Encounter (Signed)
Pt called back to make hosp f/u was schedule for 07/10/16. Called pt to complete TCM call see below.../lmb  Transition Care Management Follow-up Telephone Call   Date discharged? 06/27/16   How have you been since you were released from the hospital? Pt states she feels pretty good   Do you understand why you were in the hospital? YES   Do you understand the discharge instructions? YES   Where were you discharged to? Home   Items Reviewed:  Medications reviewed: YES  Allergies reviewed: YES  Dietary changes reviewed: YES, heart healthy  Referrals reviewed: No referral needed   Functional Questionnaire:  Activities of Daily Living (ADLs):   She states she are independent in the following: ambulation, bathing and hygiene, feeding, continence, grooming, toileting and dressing States she doesn't require assistance    Any transportation issues/concerns?: NO   Any patient concerns? NO   Confirmed importance and date/time of follow-up visits scheduled YES, pt had called back made appt for 07/10/16, confirmed appt  Provider Appointment booked with Terri Piedra, NP  Confirmed with patient if condition begins to worsen call PCP or go to the ER.  Patient was given the office number and encouraged to call back with question or concerns.  : YES

## 2016-07-08 ENCOUNTER — Other Ambulatory Visit: Payer: Self-pay | Admitting: Nurse Practitioner

## 2016-07-10 ENCOUNTER — Ambulatory Visit (INDEPENDENT_AMBULATORY_CARE_PROVIDER_SITE_OTHER): Payer: Medicare Other | Admitting: Family

## 2016-07-10 ENCOUNTER — Ambulatory Visit (INDEPENDENT_AMBULATORY_CARE_PROVIDER_SITE_OTHER): Payer: Medicare Other | Admitting: Internal Medicine

## 2016-07-10 ENCOUNTER — Encounter: Payer: Self-pay | Admitting: Family

## 2016-07-10 ENCOUNTER — Other Ambulatory Visit: Payer: Self-pay | Admitting: Family

## 2016-07-10 ENCOUNTER — Encounter: Payer: Self-pay | Admitting: Internal Medicine

## 2016-07-10 ENCOUNTER — Ambulatory Visit (INDEPENDENT_AMBULATORY_CARE_PROVIDER_SITE_OTHER)
Admission: RE | Admit: 2016-07-10 | Discharge: 2016-07-10 | Disposition: A | Discharge status: Home / Self Care | Payer: Medicare Other | Source: Ambulatory Visit | Attending: Family | Admitting: Family

## 2016-07-10 VITALS — BP 130/58 | HR 59 | Ht 62.0 in | Wt 137.6 lb

## 2016-07-10 VITALS — BP 152/64 | HR 58 | Temp 98.1°F | Resp 16 | Ht 62.0 in | Wt 137.0 lb

## 2016-07-10 DIAGNOSIS — I5032 Chronic diastolic (congestive) heart failure: Secondary | ICD-10-CM

## 2016-07-10 DIAGNOSIS — I350 Nonrheumatic aortic (valve) stenosis: Secondary | ICD-10-CM

## 2016-07-10 DIAGNOSIS — I480 Paroxysmal atrial fibrillation: Secondary | ICD-10-CM | POA: Diagnosis not present

## 2016-07-10 DIAGNOSIS — K219 Gastro-esophageal reflux disease without esophagitis: Secondary | ICD-10-CM | POA: Diagnosis not present

## 2016-07-10 DIAGNOSIS — S93602A Unspecified sprain of left foot, initial encounter: Secondary | ICD-10-CM

## 2016-07-10 DIAGNOSIS — M79672 Pain in left foot: Secondary | ICD-10-CM | POA: Diagnosis not present

## 2016-07-10 DIAGNOSIS — D509 Iron deficiency anemia, unspecified: Secondary | ICD-10-CM

## 2016-07-10 DIAGNOSIS — M7989 Other specified soft tissue disorders: Secondary | ICD-10-CM | POA: Diagnosis not present

## 2016-07-10 MED ORDER — FUROSEMIDE 40 MG PO TABS: 40.0000 mg | ORAL_TABLET | Freq: Every day | ORAL | 5 refills | Status: DC

## 2016-07-10 MED ORDER — DILTIAZEM HCL ER COATED BEADS 120 MG PO CP24: 120.0000 mg | ORAL_CAPSULE | Freq: Every day | ORAL | 5 refills | Status: DC

## 2016-07-10 NOTE — Assessment & Plan Note (Signed)
I am levels continue to decrease despite multivitamin with iron and inability to tolerate iron supplementation. No evidence of bleeding noted. Refer to hematology for possible iron infusions. Continue to monitor.

## 2016-07-10 NOTE — Patient Instructions (Signed)
Thank you for choosing [facility].  SUMMARY AND INSTRUCTIONS:  Ice x 20 minutes every 2 hours and following activity.   Elevate your foot.  Cane as needed for stability.  Follow up with cardiology regarding Cardizem and Valsartan.   Medication:  Your prescription(s) have been submitted to your pharmacy or been printed and provided for you. Please take as directed and contact our office if you believe you are having problem(s) with the medication(s) or have any questions.  Imaging / Radiology:  Please stop by radiology on the basement level of the building for your x-rays. Your results will be released to [place] (or called to you) after review, usually within 72 hours after test completion. If any treatments or changes are necessary, you will be notified at that same time.  Referrals:  They will call to schedule your appointment with Hematology and Podiatry.  Referrals have been made during this visit. You should expect to hear back from our schedulers in about 7-10 days in regards to establishing an appointment with the specialists we discussed.   Follow up:  If your symptoms worsen or fail to improve, please contact our office for further instruction, or in case of emergency go directly to the emergency room at the closest medical facility.

## 2016-07-10 NOTE — Patient Instructions (Signed)
Your physician wants you to follow-up in: 6 months with Dr. Hilty. You will receive a reminder letter in the mail two months in advance. If you don't receive a letter, please call our office to schedule the follow-up appointment.    

## 2016-07-10 NOTE — Progress Notes (Signed)
OFFICE NOTE  Chief Complaint:  Routine follow-up  Primary Care Physician: Mauricio Po, [name]  HPI:  [name] is a pleasant 76 year old female kindly referred to me be Dr. Ardeth Perfect. She reports a past medical history significant for atrial fibrillation and congestive heart failure, which occurred around the same time and probably 2012. She vaguely recalls seeing Dr. Irish Lack at that time.  She was started on warfarin therapy and no attempts were made to get her back into rhythm as she was paroxysmal. She does has a long-standing history of hypertension and dyslipidemia. She apparently developed heart failure or however has never had reassessment of her ejection fraction. She subsequently was having health issues and decided to move with her son to live in [place]. She says at that time she had some problems with chest pain and ultimately underwent a stress test. This was associated with an anaphylactic type reaction and despite their reassurances she will "no longer have any more stress test". She says she has a severe allergy to IV contrast dye which causes throat swelling as well as milk which also causes the same symptoms. Apparently she also underwent cardiac catheterization however she says that only medical therapy was recommended. This was in [place], [place].  We are trying to request records from there as well. She subsequently moved with her son to [facility], however he lost his job and she recently moved back to [place]. Currently she denies any significant shortness of breath but does report some mild leg edema which is slightly worse and occasional pain in her left chest and left arm over the last several days. She's also had pain in the right chest. None of those symptoms are worse with exertion or relieved by rest or medication.   [name] returns today for followup. She again appears upset and quite nervous. Blood pressure recently has been significantly elevated. In  fact she went to the emergency room and was hospitalized for blood pressure control. Although it seems that her blood pressure more easily was controlled once her oral medicines were restarted. Subsequently she's been started on clonidine is now up to twice a day. Blood pressure today was still high at [id number] systolic. She reports some occasional chest discomfort, mostly at rest and reports not being active enough to know if she has symptoms with exertion. She also has pain all over body significant low back pain and is seeing a specialist about that in the next few weeks.  I saw [name] back in the office today. She reports some improvement in her chest discomfort and I think this is related to blood pressure. The recent switch in her blood pressure medicines indicate a marked improvement in her pressures in she is noted to be 130/66 today. She does get some slight shortness of breath with exertion and no regular chest discomfort. At this time there is not clear evidence to pursue a further workup although I have a low threshold for further cardiac workup if she should become more symptomatic. She is in sinus rhythm and seems to be maintaining that.  [name] returns today for follow-up. She's had a remarkable past 6 months. Unfortunately she was diagnosed with a gastric cancer which was considered very high risk to resect. She was referred to [facility] for an endoscopic resection which was successful with clean margins. Prior to that she underwent cardiac evaluation by [facility] cardiology including a nuclear stress test which was low risk and didn't involve  exercise. She reports that during the last minute of exercise she went into A. fib. She's had recurrent A. fib on and off and in fact was just hospitalized again for A. fib a few weeks ago which converted spontaneously back to sinus rhythm, prior to cardiology is evaluating her. She was then directed to follow-up with me. She has fortunately been on  warfarin with therapeutic INRs. Of note, I did review her cardiac catheterization which was performed at an outside hospital 2014 which did demonstrate an 80% mid RCA stenosis as well as a 70% OM lesion and some mild to moderate disease of other vessels. At the time no stent was recommended, which leads me to believe these may be smaller vessels. The fact that her most recent stress test was again negative suggest that they are not flow limiting even though they would seem significant. Also, today she is describing left hand pain. She says that there is exquisite tenderness the left hand and it's difficult for her to extend her finger. She often notes that her left middle finger gets stuck. On palpation I do not notice any significant arthritis of the joints or nodules on the tendons.  04/19/2016  [name] returns today for follow-up. She is concerned about shortness of breath which is been progressive. She reports recently she's had some loss of vocal power and noted some upper airway wheezing. We and related with her on the office today and her oxygen saturations were noted to be 97% at rest and that came down to 95% with ambulation. EKG shows sinus rhythm at 66. Blood pressure is mildly elevated today. She has a nonproductive cough but denies fever, chills or sweats. Weight is up to 141 from 130 pounds 6 months ago.  07/11/2015  [name] returns today for follow-up. She seems to be doing fairly well. She denies any worsening shortness of breath or chest pain. She seems to be suffering from some acid reflux and will likely be undergoing another EGD with Dr. Benson Norway. She continues to have problems with voice changes which could be due to vocal cord inflammation related to her reflux.  PMHx:  Past Medical History:  Diagnosis Date  . Anemia 2005   Generally microcytic, transfusions in [address], 2012, 02/2015, 05/2015  . Aortic stenosis    Moderate November 2017  . Arthritis   . Broken back 2013    Chronic back pain.   [name] CAD (coronary artery disease)    Cardiac catheterization 2014 - 80% mid RCA and 70% OM managed medically  . CHF (congestive heart failure) ([place])   . Contrast media allergy   . Diastolic heart failure ([place])   . Esophageal cancer ([place]) 05/06/2015   Adenocarcinoma GE junction  . GERD (gastroesophageal reflux disease)   . H/O iron deficiency    05-06-15 iron infusion ([facility])  . HH (hiatus hernia) 2008   Large with associated erosions  . Hypertension   . Paroxysmal atrial fibrillation ([name])   . PONV (postoperative nausea and vomiting)   . Pulmonary emboli ([place]) 2008, 2012  . Stroke ([place])   . Transfusion history    2 units transfused 05-06-15 ([facility])    Past Surgical History:  Procedure Laterality Date  . APPENDECTOMY  1950s  . BACK SURGERY    . CARPAL TUNNEL RELEASE Bilateral 1990s  . [place] SURGERY  2014  . CHOLECYSTECTOMY  1980s   open  . COLONOSCOPY N/A 02/17/2015   Procedure: COLONOSCOPY;  Surgeon: [name], MD;  Location: [facility] ENDOSCOPY;  Service: Endoscopy;  Laterality: N/A;  . ESOPHAGOGASTRODUODENOSCOPY N/A 02/16/2015   Procedure: ESOPHAGOGASTRODUODENOSCOPY (EGD);  Surgeon: [name], MD;  Location: [facility] END[facility];  Service: Endoscopy;  Laterality: N/A;  . ESOPHAGOGASTRODUODENOSCOPY N/A 05/06/2015   Procedure: ESOPHAGOGASTRODUODENOSCOPY (EGD);  Surgeon: [name], MD;  Location: [facility] ENDOSCOPY;  Service: Endoscopy;  Laterality: N/A;  . EUS N/A 05/20/2015   Procedure: UPPER ENDOSCOPIC ULTRASOUND (EUS) LINEAR;  Surgeon: [name], MD;  Location: [facility] ENDOSCOPY;  Service: Endoscopy;  Laterality: N/A;  . exploratory lab  1950s or 1960s  . GIVENS CAPSULE STUDY N/A 05/06/2015   Procedure: GIVENS CAPSULE STUDY;  Surgeon: [name], MD;  Location: [facility] ENDOSCOPY;  Service: Endoscopy;  Laterality: N/A;  . lumbar back surgery  2012  . [place] SURGERY  1991  . TONSILLECTOMY    . TONSILLECTOMY AND ADENOIDECTOMY  1960s    FAMHx:  Family  History  Problem Relation Age of Onset  . Stroke Mother   . Heart disease Mother   . Emphysema Father   . Ovarian cancer Sister   . Stroke Sister   . Other Child     died at birth    SOCHx:   reports that she has never smoked. She has never used smokeless tobacco. She reports that she does not drink alcohol or use drugs.  ALLERGIES:  Allergies  Allergen Reactions  . Iodinated Diagnostic Agents Anaphylaxis    IPD dye Info given by patient  . Ioxaglate Anaphylaxis    Info given by patient  . Milk-Related Compounds Anaphylaxis    Lactose intolerance  . Red Dye Anaphylaxis  . Whey     Lactose intolerance  . Darvon [Propoxyphene] Rash  . Hydralazine Anxiety    Facial flushing, pt prefers not to use it.     ROS: Pertinent items noted in HPI and remainder of comprehensive ROS otherwise negative.  HOME MEDS: Current Outpatient Prescriptions  Medication Sig Dispense Refill  . acetaminophen (TYLENOL) 500 MG tablet Take 500-1,000 mg by mouth daily as needed for mild pain or moderate pain.     [name] albuterol (PROVENTIL HFA;VENTOLIN HFA) 108 (90 Base) MCG/ACT inhaler Inhale 2 puffs into the lungs every 4 (four) hours as needed for wheezing or shortness of breath. 1 Inhaler 1  . budesonide-formoterol (SYMBICORT) 160-4.5 MCG/ACT inhaler Inhale 2 puffs into the lungs 2 (two) times daily. 1 Inhaler 0  . cyanocobalamin (,VITAMIN B-12,) 1000 MCG/ML injection IM daily 11/29-12/3, then weekly x4, then monthly. 9 mL 0  . diltiazem (CARDIZEM CD) 120 MG 24 hr capsule Take 1 capsule (120 mg total) by mouth daily. 30 capsule 5  . furosemide (LASIX) 40 MG tablet Take 1 tablet (40 mg total) by mouth daily. 30 tablet 5  . gabapentin (NEURONTIN) 100 MG capsule TAKE 1 CAPSULE BY MOUTH THREE TIMES A DAY. 90 capsule 3  . metoprolol (LOPRESSOR) 50 MG tablet TAKE 1 TABLET BY MOUTH TWICE DAILY. 180 tablet 0  . pantoprazole (PROTONIX) 40 MG tablet TAKE 1 TABLET BY MOUTH TWICE DAILY. 60 tablet 0  . Pediatric  Multivitamins-Iron (FLINTSTONES PLUS IRON) chewable tablet Chew 2 tablets by mouth daily. 60 tablet 0  . simvastatin (ZOCOR) 20 MG tablet TAKE (1) TABLET BY MOUTH AT BEDTIME. 30 tablet 0  . temazepam (RESTORIL) 15 MG capsule TAKE 1 CAPSULE BY MOUTH AT BEDTIME. 30 capsule 2  . traMADol (ULTRAM) 50 MG tablet Take 1 tablet (50 mg total) by mouth every 8 (eight) hours.  90 tablet 0  . warfarin (COUMADIN) 5 MG tablet TAKE 1/2 TABLET BY MOUTH ON WEDNESDAY AND 1 TABLET ALL OTHER DAYS. 90 tablet 0   No current facility-administered medications for this visit.     LABS/IMAGING: No results found for this or any previous visit (from the past 48 hour(s)). Dg Foot Complete Left  Result Date: 07/10/2016 CLINICAL DATA:  Generalized left foot pain and swelling for 3 days. Foot sprain. EXAM: LEFT FOOT - COMPLETE 3+ VIEW COMPARISON:  None. FINDINGS: There is no evidence of fracture or dislocation. Slight bunion formation on the head of the first metatarsal. Plantar calcaneal spur. Slight anterior osteophyte on the distal tibia. Arterial vascular calcifications in the foot. IMPRESSION: No acute abnormalities. Electronically Signed   By: Lorriane Shire M.D.   On: 07/10/2016 13:47    VITALS: BP (!) 130/58   Pulse (!) 59   Ht 5\' 2"  (1.575 m)   Wt 137 lb 9.6 oz (62.4 kg)   BMI 25.17 kg/m   EXAM: General appearance: alert and no distress Neck: no carotid bruit, no JVD and thyroid not enlarged, symmetric, no tenderness/mass/nodules Lungs: clear to auscultation bilaterally Heart: regular rate and rhythm, S1, S2 normal, no murmur, click, rub or gallop Abdomen: soft, non-tender; bowel sounds normal; no masses,  no organomegaly Extremities: varicose veins noted and Left hand pain with tenderness on palpation Pulses: 2+ and symmetric Skin: Skin color, texture, turgor normal. No rashes or lesions Neurologic: Grossly normal Psych: Pleasant  EKG: Sinus bradycardia at 59  ASSESSMENT: 1. Paroxysmal atrial  fibrillation on warfarin 2. History of diastolic congestive heart failure - EF 60-65% on echo 3. Hypertension - controlled 4. Dyslipidemia 5. Moderate aortic stenosis 6. Right hand pain with contracture 7. Recent GI cancer/resected  PLAN: 1.   [name] reports her shortness of breath has been fairly stable. Blood pressure is well-controlled. Recent echo in November 2017 now shows moderate aortic stenosis which is somewhat worse than her prior echo. LVEF is 65-70%. She will need a repeat echo in one year for close follow-up of her aortic stenosis. She is undergoing treatment of reflux by her gastroenterologist.  Follow-up in 6 months.  Pixie Casino, MD, [facility] Attending Cardiologist [place] [facility] 07/10/2016, 4:14 PM

## 2016-07-10 NOTE — Assessment & Plan Note (Signed)
Symptoms and exam are consistent with a sprain of the left foot most likely tarsal metatarsal. Treat conservatively with ice, supportive shoe, and elevation as needed. Obtain x-rays to rule out fracture or structural abnormality. If symptoms do not improve consider referral to podiatry or orthopedics.

## 2016-07-10 NOTE — Assessment & Plan Note (Signed)
Paroxysmal atrial fibrillation with no further episodes of rapid ventricular response and maintained on Cardizem for rate control with beta blocker. Anticoagulated with Coumadin. Scheduled to follow-up with Coumadin clinic. Continue current dosage of Cardizem and metoprolol. Continue to monitor.

## 2016-07-10 NOTE — Progress Notes (Signed)
Subjective:    Patient ID: [name], female    DOB: Aug 07, 1940, 76 y.o.   MRN: [id number]  Chief Complaint  Patient presents with  . Hospitalization Follow-up    refill of cardizem, right foot swelling    HPI:  [name] is a 76 y.o. female who  has a past medical history of Anemia (2005); Aortic stenosis; Arthritis; Broken back (2013); CAD (coronary artery disease); CHF (congestive heart failure) ([place]); Contrast media allergy; Diastolic heart failure ([place]); Esophageal cancer ([place]) (05/06/2015); GERD (gastroesophageal reflux disease); H/O iron deficiency; HH (hiatus hernia) (2008); Hypertension; Paroxysmal atrial fibrillation ([place]); PONV (postoperative nausea and vomiting); Pulmonary emboli ([place]) (2008, 2012); Stroke [facility]); and Transfusion history. and presents today for a follow up office visit.   Recently evaluated in the emergency room and admitted to the hospital with 5 days of shortness of breath hemming previously been diagnosed with bronchitis and started on antibiotics which did not help. In the emergency department she was noted to have atrial fibrillation with rapid ventricular response and started on Cardizem drip. She was transitioned to oral Cardizem from the Cardizem drip in conjunction with a beta blocker which improved her symptoms. Upon discharge she had no further symptoms of atrial fibrillation or rapid ventricular response. Hospitalist with recommendations for continued Coumadin and just blood pressure medication accordingly. All hospital records, labs, and imaging reviewed in detail.  Since leaving the hospital she reports that she is having improvements with breathing and notes no chest pain or heart palpitations. Reports taking her medications as prescribed and denies adverse side effects. Not currently checking her blood pressure at home. Weights at home have remained stable. Has noted some left foot swelling that she indicates that she has not had any trauma or  injury. The foot has been swelling for a couple of days. Does not improve with elevation.   Allergies  Allergen Reactions  . Iodinated Diagnostic Agents Anaphylaxis    IPD dye Info given by patient  . Ioxaglate Anaphylaxis    Info given by patient  . Milk-Related Compounds Anaphylaxis    Lactose intolerance  . Red Dye Anaphylaxis  . Whey     Lactose intolerance  . Darvon [Propoxyphene] Rash  . Hydralazine Anxiety    Facial flushing, pt prefers not to use it.       Outpatient Medications Prior to Visit  Medication Sig Dispense Refill  . acetaminophen (TYLENOL) 500 MG tablet Take 500-1,000 mg by mouth daily as needed for mild pain or moderate pain.     [name] albuterol (PROVENTIL HFA;VENTOLIN HFA) 108 (90 Base) MCG/ACT inhaler Inhale 2 puffs into the lungs every 4 (four) hours as needed for wheezing or shortness of breath. 1 Inhaler 1  . budesonide-formoterol (SYMBICORT) 160-4.5 MCG/ACT inhaler Inhale 2 puffs into the lungs 2 (two) times daily. 1 Inhaler 0  . cyanocobalamin (,VITAMIN B-12,) 1000 MCG/ML injection IM daily 11/29-12/3, then weekly x4, then monthly. 9 mL 0  . gabapentin (NEURONTIN) 100 MG capsule TAKE 1 CAPSULE BY MOUTH THREE TIMES A DAY. 90 capsule 3  . metoprolol (LOPRESSOR) 50 MG tablet TAKE 1 TABLET BY MOUTH TWICE DAILY. 180 tablet 0  . pantoprazole (PROTONIX) 40 MG tablet TAKE 1 TABLET BY MOUTH TWICE DAILY. 60 tablet 0  . Pediatric Multivitamins-Iron (FLINTSTONES PLUS IRON) chewable tablet Chew 2 tablets by mouth daily. 60 tablet 0  . simvastatin (ZOCOR) 20 MG tablet TAKE (1) TABLET BY MOUTH AT BEDTIME. 30 tablet 0  . temazepam (RESTORIL)  15 MG capsule TAKE 1 CAPSULE BY MOUTH AT BEDTIME. 30 capsule 2  . traMADol (ULTRAM) 50 MG tablet Take 1 tablet (50 mg total) by mouth every 8 (eight) hours. 90 tablet 0  . warfarin (COUMADIN) 5 MG tablet TAKE 1/2 TABLET BY MOUTH ON WEDNESDAY AND 1 TABLET ALL OTHER DAYS. 90 tablet 0  . diltiazem (CARDIZEM CD) 120 MG 24 hr capsule Take 1  capsule (120 mg total) by mouth daily. 30 capsule 0  . furosemide (LASIX) 40 MG tablet Take 1 tablet (40 mg total) by mouth daily. 30 tablet 0   No facility-administered medications prior to visit.       Past Surgical History:  Procedure Laterality Date  . APPENDECTOMY  1950s  . BACK SURGERY    . CARPAL TUNNEL RELEASE Bilateral 1990s  . [place] SURGERY  2014  . CHOLECYSTECTOMY  1980s   open  . COLONOSCOPY N/A 02/17/2015   Procedure: COLONOSCOPY;  Surgeon: [name], MD;  [facility] ENDOSCOPY;  Service: Endoscopy;  Laterality: N/A;  . ESOPHAGOGASTRODUODENOSCOPY N/A 02/16/2015   Procedure: ESOPHAGOGASTRODUODENOSCOPY (EGD);  Surgeon: [name], MD;  Location: [facility];  Service: Endoscopy;  Laterality: N/A;  . ESOPHAGOGASTRODUODENOSCOPY N/A 05/06/2015   Procedure: ESOPHAGOGASTRODUODENOSCOPY (EGD);  Surgeon: [name], MD;  Location: [facility] ENDOSCOPY;  Service: Endoscopy;  Laterality: N/A;  . EUS N/A 05/20/2015   Procedure: UPPER ENDOSCOPIC ULTRASOUND (EUS) LINEAR;  Surgeon: [name], MD;  [facility];  Service: Endoscopy;  Laterality: N/A;  . exploratory lab  1950s or 1960s  . GIVENS CAPSULE STUDY N/A 05/06/2015   Procedure: GIVENS CAPSULE STUDY;  Surgeon: [name], MD;  Location: [facility] ENDOSCOPY;  Service: Endoscopy;  Laterality: N/A;  . lumbar back surgery  2012  . [place] SURGERY  1991  . TONSILLECTOMY    . TONSILLECTOMY AND ADENOIDECTOMY  1960s      Past Medical History:  Diagnosis Date  . Anemia 2005   Generally microcytic, transfusions in [address], 2012, 02/2015, 05/2015  . Aortic stenosis    Moderate November 2017  . Arthritis   . Broken back 2013   Chronic back pain.   [name] CAD (coronary artery disease)    Cardiac catheterization 2014 - 80% mid RCA and 70% OM managed medically  . CHF (congestive heart failure) ([place])   . Contrast media allergy   . Diastolic heart failure ([place])   . Esophageal cancer ([place]) 05/06/2015    Adenocarcinoma GE junction  . GERD (gastroesophageal reflux disease)   . H/O iron deficiency    05-06-15 iron infusion ([facility])  . HH (hiatus hernia) 2008   Large with associated erosions  . Hypertension   . Paroxysmal atrial fibrillation ([name])   . PONV (postoperative nausea and vomiting)   . Pulmonary emboli ([place]) 2008, 2012  . Stroke ([place])   . Transfusion history    2 units transfused 05-06-15 ([facility])      Review of Systems  Constitutional: Negative for chills and fever.  Respiratory: Negative for chest tightness, shortness of breath and wheezing.   Cardiovascular: Negative for chest pain, palpitations and leg swelling.  Musculoskeletal:       Positive for left foot pain  Neurological: Negative for weakness and numbness.      Objective:    BP (!) 152/64 (BP Location: Left Arm, Patient Position: Sitting, Cuff Size: Normal)   Pulse (!) 58   Temp 98.1 F (36.7 C) (Oral)   Resp 16   Ht  5\' 2"  (1.575 m)   Wt 137 lb (62.1 kg)   SpO2 96%   BMI 25.06 kg/m  Nursing note and vital signs reviewed.  Physical Exam  Constitutional: She is oriented to person, place, and time. She appears well-developed and well-nourished. No distress.  Cardiovascular: Normal rate, regular rhythm, normal heart sounds and intact distal pulses.  Exam reveals no gallop and no friction rub.   No murmur heard. Pulmonary/Chest: Effort normal and breath sounds normal. No respiratory distress. She has no wheezes. She has no rales. She exhibits no tenderness.  Musculoskeletal:  Left foot - no obvious deformity or discoloration with mild edema noted on the dorsum of the foot. Tenderness elicited throughout the dorsum of the foot including metatarsals and tarsal bones. Capillary refill and pulses are intact and appropriate. Sensation is intact and appropriate. Positive metatarsal glide for discomfort noted in the third, fourth, and fifth metatarsals.  Neurological: She is alert and oriented to person, place, and  time.  Skin: Skin is warm and dry.  Psychiatric: She has a normal mood and affect. Her behavior is normal. Judgment and thought content normal.       Assessment & Plan:   Problem List Items Addressed This Visit      Cardiovascular and Mediastinum   PAF (paroxysmal atrial fibrillation) ([name]) - Primary    Paroxysmal atrial fibrillation with no further episodes of rapid ventricular response and maintained on Cardizem for rate control with beta blocker. Anticoagulated with Coumadin. Scheduled to follow-up with Coumadin clinic. Continue current dosage of Cardizem and metoprolol. Continue to monitor.        Musculoskeletal and Integument   Foot sprain, left, initial encounter    Symptoms and exam are consistent with a sprain of the left foot most likely tarsal metatarsal. Treat conservatively with ice, supportive shoe, and elevation as needed. Obtain x-rays to rule out fracture or structural abnormality. If symptoms do not improve consider referral to podiatry or orthopedics.      Relevant Orders   DG Foot Complete Left (Completed)     Other   IDA (iron deficiency anemia)    I am levels continue to decrease despite multivitamin with iron and inability to tolerate iron supplementation. No evidence of bleeding noted. Refer to hematology for possible iron infusions. Continue to monitor.      Relevant Orders   Ambulatory referral to Hematology       I am having Ms. Trautwein maintain her acetaminophen, traMADol, gabapentin, temazepam, cyanocobalamin, FLINTSTONES PLUS IRON, metoprolol, warfarin, pantoprazole, simvastatin, albuterol, and budesonide-formoterol.   Follow-up: Return in about 1 month (around 08/10/2016), or if symptoms worsen or fail to improve.  Mauricio Po, [name]

## 2016-07-11 ENCOUNTER — Telehealth: Payer: Self-pay | Admitting: Family

## 2016-07-11 ENCOUNTER — Telehealth: Payer: Self-pay | Admitting: Oncology

## 2016-07-11 ENCOUNTER — Encounter: Payer: Self-pay | Admitting: Oncology

## 2016-07-11 NOTE — Telephone Encounter (Signed)
inform pt of the x-ray result, but pt has more question about it. Please give her a call back

## 2016-07-11 NOTE — Telephone Encounter (Signed)
Referral from Dr. Elna Breslow for the pt to be seen for anemia. She's a former pt of Dr. Benay Spice. Scheduled an appt for the pt to see Dr. Benay Spice on 2/6 at 330pm. Pt agreed to the appt date and time. Demographics verified. Letter mailed to the pt.

## 2016-07-12 ENCOUNTER — Other Ambulatory Visit: Payer: Self-pay | Admitting: Family

## 2016-07-12 ENCOUNTER — Other Ambulatory Visit: Payer: Self-pay | Admitting: Pharmacist

## 2016-07-12 ENCOUNTER — Telehealth: Payer: Self-pay | Admitting: *Deleted

## 2016-07-12 ENCOUNTER — Ambulatory Visit: Payer: Self-pay | Admitting: Pharmacist

## 2016-07-12 ENCOUNTER — Other Ambulatory Visit: Payer: Self-pay

## 2016-07-12 ENCOUNTER — Ambulatory Visit (HOSPITAL_COMMUNITY): Payer: Medicare Other | Attending: Family Medicine | Admitting: Physical Therapy

## 2016-07-12 DIAGNOSIS — M545 Low back pain: Secondary | ICD-10-CM | POA: Diagnosis not present

## 2016-07-12 DIAGNOSIS — R262 Difficulty in walking, not elsewhere classified: Secondary | ICD-10-CM | POA: Diagnosis not present

## 2016-07-12 DIAGNOSIS — M6281 Muscle weakness (generalized): Secondary | ICD-10-CM | POA: Insufficient documentation

## 2016-07-12 DIAGNOSIS — R2681 Unsteadiness on feet: Secondary | ICD-10-CM | POA: Diagnosis not present

## 2016-07-12 DIAGNOSIS — G8929 Other chronic pain: Secondary | ICD-10-CM | POA: Diagnosis not present

## 2016-07-12 MED ORDER — SIMVASTATIN 20 MG PO TABS: ORAL_TABLET | ORAL | 0 refills | Status: DC

## 2016-07-12 MED ORDER — PANTOPRAZOLE SODIUM 40 MG PO TBEC: 40.0000 mg | DELAYED_RELEASE_TABLET | Freq: Two times a day (BID) | ORAL | 0 refills | Status: DC

## 2016-07-12 MED ORDER — GABAPENTIN 100 MG PO CAPS: ORAL_CAPSULE | ORAL | 0 refills | Status: DC

## 2016-07-12 NOTE — Patient Outreach (Signed)
[place] [facility]) Care Management  07/12/2016  CELESTIA LUDDEN Aug 06, 1940 [id number]  Phone call to patient to follow-up if she has further medication needs.  Patient answered and states she was at therapy.  Offered to call patient back at a later date, she accepted.   Plan:  Will make another phone outreach to patient in the next week.   Karrie Meres, PharmD, [place] [phone]

## 2016-07-12 NOTE — Therapy (Signed)
[name], [place], [address] Phone: [phone]   Fax:  [phone]  Physical Therapy Evaluation  Patient Details  Name: [name] MRN: [id number] Date of Birth: [birth year] Referring Provider: Velvet Bathe   Encounter Date: 07/12/2016      PT End of Session - 07/12/16 1447    Visit Number 1   Number of Visits 8   Date for PT Re-Evaluation 08/09/16   Authorization Type Medicare    Authorization Time Period 07/12/16 to 08/12/16   Authorization - Visit Number 1   Authorization - Number of Visits 10   PT Start Time [id number]   PT Stop Time 1428   PT Time Calculation (min) 40 min   Activity Tolerance Patient tolerated treatment well   Behavior During Therapy [facility] for tasks assessed/performed      Past Medical History:  Diagnosis Date  . Anemia 2005   Generally microcytic, transfusions in [address], 2012, 02/2015, 05/2015  . Aortic stenosis    Moderate November 2017  . Arthritis   . Broken back 2013   Chronic back pain.   [name] CAD (coronary artery disease)    Cardiac catheterization 2014 - 80% mid RCA and 70% OM managed medically  . CHF (congestive heart failure) ([place])   . Contrast media allergy   . Diastolic heart failure ([place])   . Esophageal cancer ([place]) 05/06/2015   Adenocarcinoma GE junction  . GERD (gastroesophageal reflux disease)   . H/O iron deficiency    05-06-15 iron infusion ([facility])  . HH (hiatus hernia) 2008   Large with associated erosions  . Hypertension   . Paroxysmal atrial fibrillation ([name])   . PONV (postoperative nausea and vomiting)   . Pulmonary emboli ([place]) 2008, 2012  . Stroke ([place])   . Transfusion history    2 units transfused 05-06-15 ([facility])    Past Surgical History:  Procedure Laterality Date  . APPENDECTOMY  1950s  . BACK SURGERY    . CARPAL TUNNEL RELEASE Bilateral 1990s  . [place] SURGERY  2014  . CHOLECYSTECTOMY  1980s   open  . COLONOSCOPY N/A 02/17/2015    Procedure: COLONOSCOPY;  Surgeon: [name], MD;  Location: [facility] ENDOSCOPY;  Service: Endoscopy;  Laterality: N/A;  . ESOPHAGOGASTRODUODENOSCOPY N/A 02/16/2015   Procedure: ESOPHAGOGASTRODUODENOSCOPY (EGD);  Surgeon: [name], MD;  Location: [facility];  Service: Endoscopy;  Laterality: N/A;  . ESOPHAGOGASTRODUODENOSCOPY N/A 05/06/2015   Procedure: ESOPHAGOGASTRODUODENOSCOPY (EGD);  Surgeon: [name], MD;  Location: [facility] ENDOSCOPY;  Service: Endoscopy;  Laterality: N/A;  . EUS N/A 05/20/2015   Procedure: UPPER ENDOSCOPIC ULTRASOUND (EUS) LINEAR;  Surgeon: [name], MD;  Location: [facility] ENDOSCOPY;  Service: Endoscopy;  Laterality: N/A;  . exploratory lab  1950s or 1960s  . GIVENS CAPSULE STUDY N/A 05/06/2015   Procedure: GIVENS CAPSULE STUDY;  Surgeon: [name], MD;  Location: [facility] ENDOSCOPY;  Service: Endoscopy;  Laterality: N/A;  . lumbar back surgery  2012  . [place] SURGERY  1991  . TONSILLECTOMY    . TONSILLECTOMY AND ADENOIDECTOMY  1960s    There were no vitals filed for this visit.       Subjective Assessment - 07/12/16 1355    Subjective Patient has minimal time that she can be up doing anything due to her back pain. The pain causes her to go sit down, she can last maybe 10 minutes before she needs to stop. She had PT in  the past for her back, she cannot remember specifics beyond some stretching and care in rehab center. No falls recently. No incontinence or numbness in bowel/bladder area. No numbness going down her legs, the pain stays right in her back.    Pertinent History on active coumadin, A-fib, HTN, aortic stenosis, BPPV, macular degeneration, history of PE, history of broken back after fall 2013, CVA in 1982, lumbar disc surgery   How long can you sit comfortably? unlimited    How long can you stand comfortably? 10 minutes    How long can you walk comfortably? 10 minutes    Patient Stated Goals increase balance and ability to perform functional tasks  throughout her day    Currently in Pain? Yes   Pain Score 5    Pain Location Back   Pain Orientation Right   Pain Descriptors / Indicators Burning;Discomfort;Throbbing   Pain Type Chronic pain   Pain Radiating Towards none    Pain Onset More than a month ago   Pain Frequency Constant   Aggravating Factors  doing too much    Pain Relieving Factors sitting and resting    Effect of Pain on Daily Activities severe impact on ADLs             [facility] PT Assessment - 07/12/16 0001      Assessment   Medical Diagnosis LBP    Referring Provider Velvet Bathe    Onset Date/Surgical Date --  chronic    Next MD Visit tomorrow in [place], another on the 23rd      Balance Screen   Has the patient fallen in the past 6 months No   Has the patient had a decrease in activity level because of a fear of falling?  No   Is the patient reluctant to leave their home because of a fear of falling?  No     Prior Function   Level of Independence Independent;Independent with basic ADLs;Independent with gait;Independent with transfers   Vocation Retired     Observation/Other Assessments   Observations unable to tolerate special tests due to easy aggravation of low back      AROM   Lumbar Flexion moderate limitation   Lumbar Extension moderate limitation    Lumbar - Right Side Bend fingertips to midline of knee joint    Lumbar - Left Side Bend fingertips to midline of knee joint      Strength   Right Hip Flexion 3/5   Right Hip ABduction 3+/5   Left Hip Flexion 3+/5   Left Hip ABduction 3/5   Right Knee Flexion 3/5   Right Knee Extension 4+/5   Left Knee Flexion 3/5   Left Knee Extension 4/5   Right Ankle Dorsiflexion 4/5   Left Ankle Dorsiflexion 4+/5     Flexibility   Hamstrings moderate limitation    Piriformis moderate limitation      Palpation   Palpation comment severe tighntess and spasm of bilateral lumbar paraspinals noted, tender to palpation      Ambulation/Gait   Gait  Comments noted rigidity of hips and lumbar spine with gait, possible leg length discrepancy   overall reduced gait speed      High Level Balance   High Level Balance Comments TUG 16.66 with no device                            PT Education - 07/12/16 1446    Education  provided Yes   Education Details prognosis, POC, HEP    Person(s) Educated Patient   Methods Explanation;Demonstration;Handout   Comprehension Verbalized understanding;Returned demonstration;Need further instruction          PT Short Term Goals - 07/12/16 1453      PT SHORT TERM GOAL #1   Title Patient to demonstrate only mild (20-30%) limitation in lumbar motion on all planes in order to improve mechanics and reduce pain    Time 2   Period Weeks   Status New     PT SHORT TERM GOAL #2   Title Patient to demonstrate at [place] a 50% improvement in B hip flexilbity and ROM in order to improve mechanics and reduce pain    Time 2   Period Weeks   Status New     PT SHORT TERM GOAL #3   Title Patient to be able to maintain correct posture at least 75% of the time in order to improve mechanics and task performance    Time 2   Period Weeks   Status New     PT SHORT TERM GOAL #4   Title Patient to be independent in correctly and consistently performing appropriate HEP, to be updated PRN    Time 1   Period Weeks   Status New           PT Long Term Goals - 07/12/16 1457      PT LONG TERM GOAL #1   Title Patient to demonstrate functional strength 4/5 in order to reduce pain and improve mechanics    Time 4   Period Weeks   Status New     PT LONG TERM GOAL #2   Title Patient to be able to tolerate functional task performance for 20-30 minutes without pain exacerbation in order to improve QOL and independence    Time 4   Period Weeks   Status New     PT LONG TERM GOAL #3   Title Patient to be able to complete TUG test in 12 seconds or less with no device in order to show improved  overall mobility    Time 4   Period Weeks   Status New     PT LONG TERM GOAL #4   Title Patient to experience pain no more than 3/10 in order to improve QOL and functional task performance    Time 4   Period Weeks   Status New               Plan - 07/12/16 1448    Clinical Impression Statement Patient arrives reporting on-going low back pain that is quite easily aggravated; she is able to do maybe10 minutes of activity before she needs to sit down due to pain, and she was also just recently released from the hospital due to complications with her heart and A-fib. She reports cluster headaches that randomly occur and require a quick moment of rest to resolve. Examination reveals severe functional muscle weakness, quite a bit of functional stiffness throughout lumbar spine and hips, severe lumbar paraspinal muscle spasm with tendnerness to palpation, unsteadiness, reduced functional activity tolerance, and reduced ability to perform functional PLOF based tasks at this time. She will benefit from a trial of skilled PT services in order to address functional deficits, reduce pain, and assist in reaching optimal level of function.    Rehab Potential Fair   Clinical Impairments Affecting Rehab Potential (+) appears motivated to participate in PT; (-) multiple co-morbidities present  including cardiac factors    PT Frequency 2x / week   PT Duration 4 weeks   PT Treatment/Interventions ADLs/Self Care Home Management;Biofeedback;Cryotherapy;Moist Heat;DME Instruction;Gait training;Stair training;Functional mobility training;Therapeutic activities;Therapeutic exercise;Balance training;Neuromuscular re-education;Patient/family education;Manual techniques;Passive range of motion;Energy conservation;Taping;Dry needling   PT Next Visit Plan review HEP and goals; gentle hip and lumbar flexibility, postural training, transverse abdominus activation    PT Home Exercise Plan Eval: SKTC, TA sets     Consulted and Agree with Plan of Care Patient      Patient will benefit from skilled therapeutic intervention in order to improve the following deficits and impairments:  Abnormal gait, Improper body mechanics, Pain, Decreased coordination, Decreased mobility, Increased muscle spasms, Postural dysfunction, Decreased activity tolerance, Decreased range of motion, Decreased strength, Decreased balance, Difficulty walking, Impaired flexibility  Visit Diagnosis: Chronic midline low back pain without sciatica - Plan: PT plan of care cert/re-cert  Muscle weakness (generalized) - Plan: PT plan of care cert/re-cert  Difficulty in walking, not elsewhere classified - Plan: PT plan of care cert/re-cert  Unsteadiness on feet - Plan: PT plan of care cert/re-cert      G-Codes - [id number]    Functional Assessment Tool Used Based on skilled clincal assessment of strength, posture, gait, pain patterns, balance    Functional Limitation Mobility: Walking and moving around   Mobility: Walking and Moving Around Current Status [id number]) At least 60 percent but less than 80 percent impaired, limited or restricted   Mobility: Walking and Moving Around Goal Status [id number]) At least 40 percent but less than 60 percent impaired, limited or restricted       Problem List Patient Active Problem List   Diagnosis Date Noted  . Foot sprain, left, initial encounter 07/10/2016  . Hyperglycemia 06/26/2016  . Atrial fibrillation with RVR ([place]) 06/23/2016  . Acute bronchitis 06/22/2016  . Encounter for medication review and counseling 06/07/2016  . Anemia 05/28/2016  . Essential hypertension, benign 04/19/2016  . Mild aortic stenosis 09/28/2015  . Chest pain at rest 09/05/2015  . 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 [place] lesion, acute   . IDA (iron deficiency anemia)   . Occult GI bleeding   . Nausea vomiting and diarrhea 05/05/2015  . Symptomatic anemia 05/05/2015  . HLD (hyperlipidemia) 05/05/2015   . Abdominal pain 05/05/2015  . GERD (gastroesophageal reflux disease) 05/05/2015  . Absolute anemia   . Anticoagulated on Coumadin   . Osteoarthritis 04/16/2015  . History of pulmonary embolism 03/02/2015  . Shortness of breath 02/22/2015  . Benign neoplasm of descending colon 02/17/2015  . Diverticulosis of large intestine without diverticulitis 02/17/2015  . Esophageal stricture 02/16/2015  . UGI bleed 02/13/2015  . Supratherapeutic INR 02/13/2015  . Encounter for therapeutic drug monitoring 10/15/2014  . Thoracic back pain 10/15/2014  . Macular degeneration 09/01/2014  . Pneumonia 08/27/2014  . Benign paroxysmal positional vertigo   . Hypertension 08/24/2014  . Back pain 08/03/2014  . Complaints of total body pain 08/03/2014  . Sleep disturbance 08/03/2014  . Aortic stenosis 03/05/2014  . PAF (paroxysmal atrial fibrillation) ([place]) 01/21/2014  . Long term current use of anticoagulant therapy 01/21/2014  . Chronic diastolic congestive heart failure ([place]) 01/21/2014  . Chest pain 01/21/2014    Deniece Ree PT, DPT [facility] [address] [place], [place], [address] Phone: [phone]   Fax:  [phone]  Name: KALSEY SMUCKER MRN: [id number] Date of Birth: 1941/02/16

## 2016-07-12 NOTE — Telephone Encounter (Signed)
Rec'd fax pt requesting 90 day scripts on Simvastatin, Pantoprazole, Gabapentin, and temazepam due to change in insurance. Verified chart sent maintenance pls advise on temazepam.../lmb

## 2016-07-12 NOTE — Patient Instructions (Signed)
   SINGLE KNEE TO CHEST STRETCH - [place]  While Lying on your back, hold your knee and gently pull it up towards your chest.  Hold for 5 seconds and then relax.   Repeat 5 times each side, 1-2 times per day.     Transverse Abdominis  Place fingers at the side of the abdominals at the pant line. Making a "psst" sound you should be able to feel these muscles contracting.   Hold for 2 seconds and relax.  Repeat 10 times, 5 times throughout your day.

## 2016-07-12 NOTE — Telephone Encounter (Signed)
Called pt back had some questions on what the xray said. Answered questions and told her to be listening out for referral for podiatry.

## 2016-07-12 NOTE — Patient Outreach (Signed)
[facility]) Care Management  07/12/2016  [name] 04-25-1941 [id number]   Referral per 24 hour nurse advice line. Client reports seeing the doctor. She was given results of the xray, but is not sure what they mean. RNCM called to follow up. No answer. HIPPA compliant voice message left.  RNCM will continue to try to reach client.  Thea Silversmith, RN, MSN, [place] Coordinator Cell: [phone]

## 2016-07-13 ENCOUNTER — Other Ambulatory Visit: Payer: Self-pay

## 2016-07-13 ENCOUNTER — Other Ambulatory Visit: Payer: Self-pay | Admitting: Pharmacist

## 2016-07-13 ENCOUNTER — Encounter: Payer: Self-pay | Admitting: Pharmacist

## 2016-07-13 DIAGNOSIS — Z8501 Personal history of malignant neoplasm of esophagus: Secondary | ICD-10-CM | POA: Diagnosis not present

## 2016-07-13 DIAGNOSIS — I4891 Unspecified atrial fibrillation: Secondary | ICD-10-CM | POA: Diagnosis not present

## 2016-07-13 DIAGNOSIS — K219 Gastro-esophageal reflux disease without esophagitis: Secondary | ICD-10-CM | POA: Diagnosis not present

## 2016-07-13 DIAGNOSIS — R1110 Vomiting, unspecified: Secondary | ICD-10-CM | POA: Diagnosis not present

## 2016-07-13 DIAGNOSIS — I110 Hypertensive heart disease with heart failure: Secondary | ICD-10-CM | POA: Diagnosis not present

## 2016-07-13 DIAGNOSIS — I509 Heart failure, unspecified: Secondary | ICD-10-CM | POA: Diagnosis not present

## 2016-07-13 DIAGNOSIS — Z79899 Other long term (current) drug therapy: Secondary | ICD-10-CM | POA: Diagnosis not present

## 2016-07-13 DIAGNOSIS — K449 Diaphragmatic hernia without obstruction or gangrene: Secondary | ICD-10-CM | POA: Diagnosis not present

## 2016-07-13 DIAGNOSIS — Z7901 Long term (current) use of anticoagulants: Secondary | ICD-10-CM | POA: Diagnosis not present

## 2016-07-13 DIAGNOSIS — D649 Anemia, unspecified: Secondary | ICD-10-CM | POA: Diagnosis not present

## 2016-07-13 DIAGNOSIS — Z885 Allergy status to narcotic agent status: Secondary | ICD-10-CM | POA: Diagnosis not present

## 2016-07-13 DIAGNOSIS — G629 Polyneuropathy, unspecified: Secondary | ICD-10-CM | POA: Diagnosis not present

## 2016-07-13 DIAGNOSIS — C160 Malignant neoplasm of cardia: Secondary | ICD-10-CM | POA: Diagnosis not present

## 2016-07-13 DIAGNOSIS — R198 Other specified symptoms and signs involving the digestive system and abdomen: Secondary | ICD-10-CM | POA: Diagnosis not present

## 2016-07-13 NOTE — Patient Outreach (Signed)
[facility]) Care Management  07/13/2016  SWAYZE SCHIED 06-12-1941 [id number]  Successful phone outreach to patient, HIPAA verified.  Patient reports she took her medications and medication list from recent PCP office visit to her local pharmacy and had her local pharmacy take old medications off of auto-refill, which were previously discontinued by hospital.  She reports she is going to obtain 90 day supplies of her maintenance medications.   Patient reports she has no further medication related questions since [facility] Pharmacist home visit last month with patient to review her medications following her hospital discharge.   Patient asked [facility] Pharmacist about a recent foot x-ray she had and she was counseled to contact her PCP office/podiatry as her PCP office appears to have referred her to podiatry.  Plan:  Given patient denies further medication questions/concerns or a follow-up home visit, will close pharmacy case at this time.   Will route closure letter to PCP.   Patient aware she can contact [facility] Pharmacist if new pharmacy needs arise.   Karrie Meres, PharmD, [place] [phone]

## 2016-07-13 NOTE — Telephone Encounter (Signed)
Faxed scripts back to [employer]...[name]

## 2016-07-13 NOTE — Telephone Encounter (Signed)
Faxed controls to [employer]...[name]

## 2016-07-13 NOTE — Patient Outreach (Signed)
[facility]) Care Management  07/13/2016  [name] 1940/09/17 [id number]  RNCM called to follow up regarding 24 hour nurse advice line call. RNCM spoke with client. [name] request RNCM call back stating it was not a good time, she was in [place]/driving.   Plan: RNCM will follow up tomorrow.  Thea Silversmith, RN, MSN, [place] Coordinator Cell: [phone]

## 2016-07-14 ENCOUNTER — Ambulatory Visit (HOSPITAL_COMMUNITY): Payer: Medicare Other

## 2016-07-14 ENCOUNTER — Other Ambulatory Visit: Payer: Self-pay

## 2016-07-14 DIAGNOSIS — R2681 Unsteadiness on feet: Secondary | ICD-10-CM | POA: Diagnosis not present

## 2016-07-14 DIAGNOSIS — R262 Difficulty in walking, not elsewhere classified: Secondary | ICD-10-CM

## 2016-07-14 DIAGNOSIS — M545 Low back pain, unspecified: Secondary | ICD-10-CM

## 2016-07-14 DIAGNOSIS — M6281 Muscle weakness (generalized): Secondary | ICD-10-CM

## 2016-07-14 DIAGNOSIS — G8929 Other chronic pain: Secondary | ICD-10-CM

## 2016-07-14 NOTE — Therapy (Signed)
[place], [place], [address] Phone: [phone]   Fax:  [phone]  Physical Therapy Treatment  Patient Details  Name: [name] MRN: [id number] Date of Birth: February 26, 1941 Referring Provider: Velvet Bathe   Encounter Date: 07/14/2016      PT End of Session - 07/14/16 1613    Visit Number 2   Number of Visits 8   Date for PT Re-Evaluation 08/09/16   Authorization Type Medicare    Authorization Time Period 07/12/16 to 08/12/16   Authorization - Visit Number 2   Authorization - Number of Visits 10   PT Start Time 1606   PT Stop Time 1642   PT Time Calculation (min) 36 min   Activity Tolerance Patient tolerated treatment well   Behavior During Therapy [facility] for tasks assessed/performed      Past Medical History:  Diagnosis Date  . Anemia 2005   Generally microcytic, transfusions in [address], 2012, 02/2015, 05/2015  . Aortic stenosis    Moderate November 2017  . Arthritis   . Broken back 2013   Chronic back pain.   [name] CAD (coronary artery disease)    Cardiac catheterization 2014 - 80% mid RCA and 70% OM managed medically  . CHF (congestive heart failure) ([place])   . Contrast media allergy   . Diastolic heart failure ([place])   . Esophageal cancer ([place]) 05/06/2015   Adenocarcinoma GE junction  . GERD (gastroesophageal reflux disease)   . H/O iron deficiency    05-06-15 iron infusion ([facility])  . HH (hiatus hernia) 2008   Large with associated erosions  . Hypertension   . Paroxysmal atrial fibrillation ([name])   . PONV (postoperative nausea and vomiting)   . Pulmonary emboli ([place]) 2008, 2012  . Stroke ([place])   . Transfusion history    2 units transfused 05-06-15 ([facility])    Past Surgical History:  Procedure Laterality Date  . APPENDECTOMY  1950s  . BACK SURGERY    . CARPAL TUNNEL RELEASE Bilateral 1990s  . [place] SURGERY  2014  . CHOLECYSTECTOMY  1980s   open  . COLONOSCOPY N/A 02/17/2015   Procedure: COLONOSCOPY;  Surgeon: [name], MD;  Location: [facility] ENDOSCOPY;  Service: Endoscopy;  Laterality: N/A;  . ESOPHAGOGASTRODUODENOSCOPY N/A 02/16/2015   Procedure: ESOPHAGOGASTRODUODENOSCOPY (EGD);  Surgeon: [name], MD;  Location: [facility];  Service: Endoscopy;  Laterality: N/A;  . ESOPHAGOGASTRODUODENOSCOPY N/A 05/06/2015   Procedure: ESOPHAGOGASTRODUODENOSCOPY (EGD);  Surgeon: [name], MD;  Location: [facility] ENDOSCOPY;  Service: Endoscopy;  Laterality: N/A;  . EUS N/A 05/20/2015   Procedure: UPPER ENDOSCOPIC ULTRASOUND (EUS) LINEAR;  Surgeon: [name], MD;  Location: [facility] ENDOSCOPY;  Service: Endoscopy;  Laterality: N/A;  . exploratory lab  1950s or 1960s  . GIVENS CAPSULE STUDY N/A 05/06/2015   Procedure: GIVENS CAPSULE STUDY;  Surgeon: [name], MD;  Location: [facility] ENDOSCOPY;  Service: Endoscopy;  Laterality: N/A;  . lumbar back surgery  2012  . [place] SURGERY  1991  . TONSILLECTOMY    . TONSILLECTOMY AND ADENOIDECTOMY  1960s    There were no vitals filed for this visit.      Subjective Assessment - 07/14/16 1611    Subjective Pt stated she has constant LBP decreasing ability to complete ADL's.  Current pain scale 5/10 Rt lower back sore achey and tenderness.  Has began HEP without questions   Pertinent History on active coumadin, A-fib, HTN, aortic stenosis, BPPV,  macular degeneration, history of PE, history of broken back after fall 2013, CVA in 1982, lumbar disc surgery   Patient Stated Goals increase balance and ability to perform functional tasks throughout her day    Currently in Pain? Yes   Pain Score 5    Pain Location Back   Pain Orientation Right;Lower   Pain Descriptors / Indicators Aching;Sore;Tender   Pain Type Chronic pain   Pain Radiating Towards none   Pain Onset More than a month ago   Pain Frequency Constant   Aggravating Factors  doing too much   Pain Relieving Factors sitting and resting    Effect of Pain on Daily  Activities severe impact on ADLs                         [facility] Adult PT Treatment/Exercise - 07/14/16 0001      Bed Mobility   Bed Mobility Sit to Sidelying Left   Sit to Sidelying Left 5: Supervision   Sit to Sidelying Left Details (indicate cue type and reason) instructed correct bed mobilty to reduce strain on back musculature     Exercises   Exercises Lumbar     Lumbar Exercises: Stretches   Passive Hamstring Stretch 2 reps;30 seconds   Single Knee to Chest Stretch 5 reps;10 seconds   Lower Trunk Rotation 5 reps;10 seconds   Piriformis Stretch 1 rep;20 seconds   Piriformis Stretch Limitations seated     Lumbar Exercises: Supine   Ab Set 10 reps;3 seconds   AB Set Limitations TrA activaiton, cueing for correct mm activation and breathing   Bridge 10 reps                PT Education - 07/14/16 1647    Education provided Yes   Education Details Reviewed goals, compliance and instructed correct form/technique wiht HEP; copy of eval given to pt.  Instructed correct bed mechanics   Person(s) Educated Patient   Methods Explanation;Demonstration;Tactile cues;Other (comment);Handout   Comprehension Verbalized understanding;Returned demonstration;Need further instruction          PT Short Term Goals - 07/12/16 1453      PT SHORT TERM GOAL #1   Title Patient to demonstrate only mild (20-30%) limitation in lumbar motion on all planes in order to improve mechanics and reduce pain    Time 2   Period Weeks   Status New     PT SHORT TERM GOAL #2   Title Patient to demonstrate at [place] a 50% improvement in B hip flexilbity and ROM in order to improve mechanics and reduce pain    Time 2   Period Weeks   Status New     PT SHORT TERM GOAL #3   Title Patient to be able to maintain correct posture at least 75% of the time in order to improve mechanics and task performance    Time 2   Period Weeks   Status New     PT SHORT TERM GOAL #4   Title Patient  to be independent in correctly and consistently performing appropriate HEP, to be updated PRN    Time 1   Period Weeks   Status New           PT Long Term Goals - 07/12/16 1457      PT LONG TERM GOAL #1   Title Patient to demonstrate functional strength 4/5 in order to reduce pain and improve mechanics    Time 4  Period Weeks   Status New     PT LONG TERM GOAL #2   Title Patient to be able to tolerate functional task performance for 20-30 minutes without pain exacerbation in order to improve QOL and independence    Time 4   Period Weeks   Status New     PT LONG TERM GOAL #3   Title Patient to be able to complete TUG test in 12 seconds or less with no device in order to show improved overall mobility    Time 4   Period Weeks   Status New     PT LONG TERM GOAL #4   Title Patient to experience pain no more than 3/10 in order to improve QOL and functional task performance    Time 4   Period Weeks   Status New               Plan - 07/14/16 1643    Clinical Impression Statement Reviewed goals, assured compliance with HEP and reviewed proper form/technqiue with exercises and copy of eval given to pt.  Instructed proper bed mobility to reduce strain on lowerback.  Session focus on improving lumbar and hip mobilty with stretches and proximal musculature strengthening with therapist facilitaiotn for proper form and cueing to breath.  EOS pt reports increased pain from laying supine.     Rehab Potential Fair   Clinical Impairments Affecting Rehab Potential (+) appears motivated to participate in PT; (-) multiple co-morbidities present including cardiac factors    PT Frequency 2x / week   PT Duration 4 weeks   PT Treatment/Interventions ADLs/Self Care Home Management;Biofeedback;Cryotherapy;Moist Heat;DME Instruction;Gait training;Stair training;Functional mobility training;Therapeutic activities;Therapeutic exercise;Balance training;Neuromuscular re-education;Patient/family  education;Manual techniques;Passive range of motion;Energy conservation;Taping;Dry needling   PT Next Visit Plan gentle hip and lumbar flexibility, postural training, transverse abdominus activation       Patient will benefit from skilled therapeutic intervention in order to improve the following deficits and impairments:  Abnormal gait, Improper body mechanics, Pain, Decreased coordination, Decreased mobility, Increased muscle spasms, Postural dysfunction, Decreased activity tolerance, Decreased range of motion, Decreased strength, Decreased balance, Difficulty walking, Impaired flexibility  Visit Diagnosis: Chronic midline low back pain without sciatica  Muscle weakness (generalized)  Difficulty in walking, not elsewhere classified  Unsteadiness on feet     Problem List Patient Active Problem List   Diagnosis Date Noted  . Foot sprain, left, initial encounter 07/10/2016  . Hyperglycemia 06/26/2016  . Atrial fibrillation with RVR ([place]) 06/23/2016  . Acute bronchitis 06/22/2016  . Encounter for medication review and counseling 06/07/2016  . Anemia 05/28/2016  . Essential hypertension, benign 04/19/2016  . Mild aortic stenosis 09/28/2015  . Chest pain at rest 09/05/2015  . 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 [place] lesion, acute   . IDA (iron deficiency anemia)   . Occult GI bleeding   . Nausea vomiting and diarrhea 05/05/2015  . Symptomatic anemia 05/05/2015  . HLD (hyperlipidemia) 05/05/2015  . Abdominal pain 05/05/2015  . GERD (gastroesophageal reflux disease) 05/05/2015  . Absolute anemia   . Anticoagulated on Coumadin   . Osteoarthritis 04/16/2015  . History of pulmonary embolism 03/02/2015  . Shortness of breath 02/22/2015  . Benign neoplasm of descending colon 02/17/2015  . Diverticulosis of large intestine without diverticulitis 02/17/2015  . Esophageal stricture 02/16/2015  . UGI bleed 02/13/2015  . Supratherapeutic INR 02/13/2015  . Encounter for  therapeutic drug monitoring 10/15/2014  . Thoracic back pain 10/15/2014  . Macular degeneration 09/01/2014  .  Pneumonia 08/27/2014  . Benign paroxysmal positional vertigo   . Hypertension 08/24/2014  . Back pain 08/03/2014  . Complaints of total body pain 08/03/2014  . Sleep disturbance 08/03/2014  . Aortic stenosis 03/05/2014  . PAF (paroxysmal atrial fibrillation) ([place]) 01/21/2014  . Long term current use of anticoagulant therapy 01/21/2014  . Chronic diastolic congestive heart failure ([place]) 01/21/2014  . Chest pain 01/21/2014   Ihor Austin, [place]; [place]  Aldona Lento 07/14/2016, 4:48 PM  [place] [address] [place], [place], [address] Phone: [phone]   Fax:  [phone]  Name: [name] MRN: [id number] Date of Birth: 16-Sep-1940

## 2016-07-14 NOTE — Patient Outreach (Signed)
[facility]) Care Management  07/14/2016  ARAH ARO [birth year] [phone]  RNCM called to follow up regarding nurse call line-client with questions regarding interpretation of her xray report. Ms. Bies states she has spoken with her doctor and has all her questions answered. She denies any questions or issues with other disease processes. No further CM needs identified. Ms. Tempesta reports she has all her pharmacy needs met. Case is closed per pharmacy.  Thea Silversmith, RN, MSN, [place] Coordinator Cell: [phone]

## 2016-07-18 ENCOUNTER — Telehealth (HOSPITAL_COMMUNITY): Payer: Self-pay

## 2016-07-18 NOTE — Telephone Encounter (Signed)
call our office before coming to this apptment tomorrow due to possible weather issues.. NF 07/18/16

## 2016-07-19 ENCOUNTER — Ambulatory Visit (HOSPITAL_COMMUNITY): Payer: Medicare Other

## 2016-07-21 ENCOUNTER — Encounter (HOSPITAL_COMMUNITY): Payer: Medicare Other | Admitting: Physical Therapy

## 2016-07-21 ENCOUNTER — Telehealth (HOSPITAL_COMMUNITY): Payer: Self-pay | Admitting: Physical Therapy

## 2016-07-21 NOTE — Telephone Encounter (Signed)
She can not get out of her driveway today

## 2016-07-24 ENCOUNTER — Ambulatory Visit (HOSPITAL_COMMUNITY): Payer: Medicare Other | Admitting: Physical Therapy

## 2016-07-24 DIAGNOSIS — R262 Difficulty in walking, not elsewhere classified: Secondary | ICD-10-CM | POA: Diagnosis not present

## 2016-07-24 DIAGNOSIS — R2681 Unsteadiness on feet: Secondary | ICD-10-CM | POA: Diagnosis not present

## 2016-07-24 DIAGNOSIS — G8929 Other chronic pain: Secondary | ICD-10-CM

## 2016-07-24 DIAGNOSIS — M545 Low back pain: Principal | ICD-10-CM

## 2016-07-24 DIAGNOSIS — M6281 Muscle weakness (generalized): Secondary | ICD-10-CM

## 2016-07-24 NOTE — Therapy (Signed)
[place], [place], [address] Phone: [phone]   Fax:  [phone]  Physical Therapy Treatment  Patient Details  Name: [name] MRN: [id number] Date of Birth: January 22, 1941 Referring Provider: Velvet Bathe   Encounter Date: 07/24/2016      PT End of Session - 07/24/16 1153    Visit Number 3   Number of Visits 8   Date for PT Re-Evaluation 08/09/16   Authorization Type Medicare    Authorization Time Period 07/12/16 to 08/12/16   Authorization - Visit Number 3   Authorization - Number of Visits 10   PT Start Time 1120   PT Stop Time 1155   PT Time Calculation (min) 35 min   Activity Tolerance Patient tolerated treatment well   Behavior During Therapy [facility] for tasks assessed/performed      Past Medical History:  Diagnosis Date  . Anemia 2005   Generally microcytic, transfusions in [address], 2012, 02/2015, 05/2015  . Aortic stenosis    Moderate November 2017  . Arthritis   . Broken back 2013   Chronic back pain.   [name] CAD (coronary artery disease)    Cardiac catheterization 2014 - 80% mid RCA and 70% OM managed medically  . CHF (congestive heart failure) ([place])   . Contrast media allergy   . Diastolic heart failure ([place])   . Esophageal cancer ([place]) 05/06/2015   Adenocarcinoma GE junction  . GERD (gastroesophageal reflux disease)   . H/O iron deficiency    05-06-15 iron infusion ([facility])  . HH (hiatus hernia) 2008   Large with associated erosions  . Hypertension   . Paroxysmal atrial fibrillation ([name])   . PONV (postoperative nausea and vomiting)   . Pulmonary emboli ([place]) 2008, 2012  . Stroke ([place])   . Transfusion history    2 units transfused 05-06-15 ([facility])    Past Surgical History:  Procedure Laterality Date  . APPENDECTOMY  1950s  . BACK SURGERY    . CARPAL TUNNEL RELEASE Bilateral 1990s  . [place] SURGERY  2014  . CHOLECYSTECTOMY  1980s   open  . COLONOSCOPY N/A 02/17/2015    Procedure: COLONOSCOPY;  Surgeon: [name], MD;  Location[facility] ENDOSCOPY;  Service: Endoscopy;  Laterality: N/A;  . ESOPHAGOGASTRODUODENOSCOPY N/A 02/16/2015   Procedure: ESOPHAGOGASTRODUODENOSCOPY (EGD);  Surgeon: [name], MD;  Location: [facility];  Service: Endoscopy;  Laterality: N/A;  . ESOPHAGOGASTRODUODENOSCOPY N/A 05/06/2015   Procedure: ESOPHAGOGASTRODUODENOSCOPY (EGD);  Surgeon: [name], MD;  Location: [facility] ENDOSCOPY;  Service: Endoscopy;  Laterality: N/A;  . EUS N/A 05/20/2015   Procedure: UPPER ENDOSCOPIC ULTRASOUND (EUS) LINEAR;  Surgeon: [name], MD;  Location: [facility] ENDOSCOPY;  Service: Endoscopy;  Laterality: N/A;  . exploratory lab  1950s or 1960s  . GIVENS CAPSULE STUDY N/A 05/06/2015   Procedure: GIVENS CAPSULE STUDY;  Surgeon: [name], MD;  Location: [facility] ENDOSCOPY;  Service: Endoscopy;  Laterality: N/A;  . lumbar back surgery  2012  . [place] SURGERY  1991  . TONSILLECTOMY    . TONSILLECTOMY AND ADENOIDECTOMY  1960s    There were no vitals filed for this visit.      Subjective Assessment - 07/24/16 1119    Subjective Pt states she was really sore after her last session and debated about not coming back.  currently 7/10 LBP and completing her HEP.   Currently in Pain? Yes   Pain Score 7    Pain Location  Back   Pain Orientation Right;Lower   Pain Descriptors / Indicators Aching;Sore;Tender   Pain Type Chronic pain                         [facility] Adult PT Treatment/Exercise - 07/24/16 0001      Bed Mobility   Bed Mobility Sit to Sidelying Left   Sit to Sidelying Left 5: Supervision     Lumbar Exercises: Stretches   Active Hamstring Stretch 3 reps;20 seconds   Active Hamstring Stretch Limitations with rope in supine   Single Knee to Chest Stretch 5 reps;10 seconds   Lower Trunk Rotation 5 reps;10 seconds   Piriformis Stretch 3 reps;20 seconds   Piriformis Stretch Limitations seated     Lumbar Exercises: Seated    Long Arc Quad on Chair Both;10 reps   Sit to Stand 5 reps   Sit to Stand Limitations no UE's     Lumbar Exercises: Supine   Ab Set 10 reps;3 seconds   AB Set Limitations TrA activaiton, cueing for correct mm activation and breathing   Bridge 10 reps                  PT Short Term Goals - 07/12/16 1453      PT SHORT TERM GOAL #1   Title Patient to demonstrate only mild (20-30%) limitation in lumbar motion on all planes in order to improve mechanics and reduce pain    Time 2   Period Weeks   Status New     PT SHORT TERM GOAL #2   Title Patient to demonstrate at [place] a 50% improvement in B hip flexilbity and ROM in order to improve mechanics and reduce pain    Time 2   Period Weeks   Status New     PT SHORT TERM GOAL #3   Title Patient to be able to maintain correct posture at least 75% of the time in order to improve mechanics and task performance    Time 2   Period Weeks   Status New     PT SHORT TERM GOAL #4   Title Patient to be independent in correctly and consistently performing appropriate HEP, to be updated PRN    Time 1   Period Weeks   Status New           PT Long Term Goals - 07/12/16 1457      PT LONG TERM GOAL #1   Title Patient to demonstrate functional strength 4/5 in order to reduce pain and improve mechanics    Time 4   Period Weeks   Status New     PT LONG TERM GOAL #2   Title Patient to be able to tolerate functional task performance for 20-30 minutes without pain exacerbation in order to improve QOL and independence    Time 4   Period Weeks   Status New     PT LONG TERM GOAL #3   Title Patient to be able to complete TUG test in 12 seconds or less with no device in order to show improved overall mobility    Time 4   Period Weeks   Status New     PT LONG TERM GOAL #4   Title Patient to experience pain no more than 3/10 in order to improve QOL and functional task performance    Time 4   Period Weeks   Status New  Plan - 07/24/16 1159    Clinical Impression Statement Pt reporting increased pain this session.  Educated patient that soreness/irritation is common when beginning new actvities/exercises.  Continued with established therex with noted discomfort in supine positioning.  Pt informed therapist that she does sleep in her recliner at home and uses the backs of her LE's to help her stand from chairs.  Informed patient neiither of these things are helping her condition..  began sit to stand wtihout use of UE's of backs of legs.  noted fatigue and difficulty completing.  Pt with increased pain to 8/10 at end of session.  discussed with evaluating therapist and okay to shift to standing and seated exercises addressing functional and core strength.     Rehab Potential Fair   Clinical Impairments Affecting Rehab Potential (+) appears motivated to participate in PT; (-) multiple co-morbidities present including cardiac factors    PT Frequency 2x / week   PT Duration 4 weeks   PT Treatment/Interventions ADLs/Self Care Home Management;Biofeedback;Cryotherapy;Moist Heat;DME Instruction;Gait training;Stair training;Functional mobility training;Therapeutic activities;Therapeutic exercise;Balance training;Neuromuscular re-education;Patient/family education;Manual techniques;Passive range of motion;Energy conservation;Taping;Dry needling   PT Next Visit Plan Continue with seated and standing activity to see if better response versus supine.  Progress towards established goals.      Patient will benefit from skilled therapeutic intervention in order to improve the following deficits and impairments:  Abnormal gait, Improper body mechanics, Pain, Decreased coordination, Decreased mobility, Increased muscle spasms, Postural dysfunction, Decreased activity tolerance, Decreased range of motion, Decreased strength, Decreased balance, Difficulty walking, Impaired flexibility  Visit Diagnosis: Chronic  midline low back pain without sciatica  Muscle weakness (generalized)  Difficulty in walking, not elsewhere classified  Unsteadiness on feet     Problem List Patient Active Problem List   Diagnosis Date Noted  . Foot sprain, left, initial encounter 07/10/2016  . Hyperglycemia 06/26/2016  . Atrial fibrillation with RVR ([place]) 06/23/2016  . Acute bronchitis 06/22/2016  . Encounter for medication review and counseling 06/07/2016  . Anemia 05/28/2016  . Essential hypertension, benign 04/19/2016  . Mild aortic stenosis 09/28/2015  . Chest pain at rest 09/05/2015  . 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 [place] lesion, acute   . IDA (iron deficiency anemia)   . Occult GI bleeding   . Nausea vomiting and diarrhea 05/05/2015  . Symptomatic anemia 05/05/2015  . HLD (hyperlipidemia) 05/05/2015  . Abdominal pain 05/05/2015  . GERD (gastroesophageal reflux disease) 05/05/2015  . Absolute anemia   . Anticoagulated on Coumadin   . Osteoarthritis 04/16/2015  . History of pulmonary embolism 03/02/2015  . Shortness of breath 02/22/2015  . Benign neoplasm of descending colon 02/17/2015  . Diverticulosis of large intestine without diverticulitis 02/17/2015  . Esophageal stricture 02/16/2015  . UGI bleed 02/13/2015  . Supratherapeutic INR 02/13/2015  . Encounter for therapeutic drug monitoring 10/15/2014  . Thoracic back pain 10/15/2014  . Macular degeneration 09/01/2014  . Pneumonia 08/27/2014  . Benign paroxysmal positional vertigo   . Hypertension 08/24/2014  . Back pain 08/03/2014  . Complaints of total body pain 08/03/2014  . Sleep disturbance 08/03/2014  . Aortic stenosis 03/05/2014  . PAF (paroxysmal atrial fibrillation) ([place]) 01/21/2014  . Long term current use of anticoagulant therapy 01/21/2014  . Chronic diastolic congestive heart failure ([place]) 01/21/2014  . Chest pain 01/21/2014    Teena Irani, PTA/CLT [phone]  07/24/2016, 12:05 PM  [place], [place], [address] Phone: [phone]  Fax:  [phone]  Name: [name] MRN: [id number] Date of Birth: 05/22/1941

## 2016-07-25 ENCOUNTER — Ambulatory Visit (INDEPENDENT_AMBULATORY_CARE_PROVIDER_SITE_OTHER): Payer: Medicare Other | Admitting: Internal Medicine

## 2016-07-25 ENCOUNTER — Telehealth: Payer: Self-pay | Admitting: Oncology

## 2016-07-25 ENCOUNTER — Encounter (INDEPENDENT_AMBULATORY_CARE_PROVIDER_SITE_OTHER): Payer: Self-pay | Admitting: Internal Medicine

## 2016-07-25 VITALS — BP 150/54 | HR 60 | Temp 98.5°F | Ht 62.0 in | Wt 136.0 lb

## 2016-07-25 DIAGNOSIS — K921 Melena: Secondary | ICD-10-CM | POA: Diagnosis not present

## 2016-07-25 DIAGNOSIS — C159 Malignant neoplasm of esophagus, unspecified: Secondary | ICD-10-CM

## 2016-07-25 NOTE — Telephone Encounter (Signed)
Patient called to confirm her appointment.

## 2016-07-25 NOTE — Patient Instructions (Addendum)
Keep appt with Dr Stephanie Acre. If any problems call our office or go to the ED

## 2016-07-25 NOTE — Progress Notes (Signed)
Subjective:    Patient ID: [name], female    DOB: 1941-06-30, 76 y.o.   MRN: [id number]  HPI Here today after admission in November for GI bleed. Seen by Dr. Laural Golden. Has multiple health problems including atrial fib and maintained on Coumadin.  Admitted to [facility] with progressive weakness and exertional dyspnea.  Hemoglobin 6.5 on admission. On 09/06/2015 her hemoglobin was 11.5. Stool was guaiac negative. While in hospital she received 2 units of PRBCs.  Hx of iron deficiency anemia. In August 2016 she underwent and EGD an colonoscopy. Noted to have a stricture at GE junction, large hiatal hernia and colonoscopy revealed 84mm pedunculate which was not an adenoma.  In November of 2016 she presented with anemia and melena. EGD revealed ulcerated mass at cardia and biopsy revealed adenocarcinoma. She underwent an endoscopic submucosal dissection for an intramucosal carcinoma of the gastric cardia by Dr.Grimm at [facility].  Resection margin were negative.  Treated for H. Pylori in 2017.  Recently saw Dr. Stephanie Acre 07/14/2015. She is going to be scheduled for an EGD in the near future. She tells me she is doing okay.  She says she has a lot of trouble with reflux. Dr. Stephanie Acre has talked with her about doing surgery on her hiatal hernia. She says she thinks she is going to let him do the surgery. She occasionally has some dysphagia maybe once a week. She has some epigastric pain at times.  She occasionally has some melena.  07/14/2015 11.8.     CBC    Component Value Date/Time   WBC 5.9 06/27/2016 0415   RBC 4.32 06/27/2016 0415   HGB 11.2 (L) 06/27/2016 0415   HCT 35.7 (L) 06/27/2016 0415   HCT 35.0 06/27/2016 0415   PLT 191 06/27/2016 0415   MCV 82.6 06/27/2016 0415   MCH 25.9 (L) 06/27/2016 0415   MCHC 31.4 06/27/2016 0415   RDW 17.7 (H) 06/27/2016 0415   LYMPHSABS 1.2 06/23/2016 0908   MONOABS 1.0 06/23/2016 0908   EOSABS 0.0 06/23/2016 0908   BASOSABS 0.0 06/23/2016 0908    Review of  Systems Past Medical History:  Diagnosis Date  . Anemia 2005   Generally microcytic, transfusions in [address], 2012, 02/2015, 05/2015  . Aortic stenosis    Moderate November 2017  . Arthritis   . Broken back 2013   Chronic back pain.   [name] CAD (coronary artery disease)    Cardiac catheterization 2014 - 80% mid RCA and 70% OM managed medically  . CHF (congestive heart failure) ([place])   . Contrast media allergy   . Diastolic heart failure ([place])   . Esophageal cancer ([place]) 05/06/2015   Adenocarcinoma GE junction  . GERD (gastroesophageal reflux disease)   . H/O iron deficiency    05-06-15 iron infusion ([facility])  . HH (hiatus hernia) 2008   Large with associated erosions  . Hypertension   . Paroxysmal atrial fibrillation ([name])   . PONV (postoperative nausea and vomiting)   . Pulmonary emboli ([place]) 2008, 2012  . Stroke ([place])   . Transfusion history    2 units transfused 05-06-15 ([facility])    Past Surgical History:  Procedure Laterality Date  . APPENDECTOMY  1950s  . BACK SURGERY    . CARPAL TUNNEL RELEASE Bilateral 1990s  . [place] SURGERY  2014  . CHOLECYSTECTOMY  1980s   open  . COLONOSCOPY N/A 02/17/2015   Procedure: COLONOSCOPY;  Surgeon: [name], MD;  Location: [facility] ENDOSCOPY;  Service:  Endoscopy;  Laterality: N/A;  . ESOPHAGOGASTRODUODENOSCOPY N/A 02/16/2015   Procedure: ESOPHAGOGASTRODUODENOSCOPY (EGD);  Surgeon: [name], MD;  Location: [facility];  Service: Endoscopy;  Laterality: N/A;  . ESOPHAGOGASTRODUODENOSCOPY N/A 05/06/2015   Procedure: ESOPHAGOGASTRODUODENOSCOPY (EGD);  Surgeon: [name], MD;  Location: [facility] ENDOSCOPY;  Service: Endoscopy;  Laterality: N/A;  . EUS N/A 05/20/2015   Procedure: UPPER ENDOSCOPIC ULTRASOUND (EUS) LINEAR;  Surgeon: [name], MD;  Location: [facility] ENDOSCOPY;  Service: Endoscopy;  Laterality: N/A;  . exploratory lab  1950s or 1960s  . GIVENS CAPSULE STUDY N/A 05/06/2015   Procedure: GIVENS CAPSULE STUDY;  Surgeon: [name], MD;  Location: [facility] ENDOSCOPY;  Service: Endoscopy;  Laterality: N/A;  . lumbar back surgery  2012  . [place] SURGERY  1991  . TONSILLECTOMY    . TONSILLECTOMY AND ADENOIDECTOMY  1960s    Allergies  Allergen Reactions  . Iodinated Diagnostic Agents Anaphylaxis    IPD dye Info given by patient  . Ioxaglate Anaphylaxis    Info given by patient  . Milk-Related Compounds Anaphylaxis    Lactose intolerance  . Red Dye Anaphylaxis  . Whey     Lactose intolerance  . Darvon [Propoxyphene] Rash  . Hydralazine Anxiety    Facial flushing, pt prefers not to use it.     Current Outpatient Prescriptions on File Prior to Visit  Medication Sig Dispense Refill  . acetaminophen (TYLENOL) 500 MG tablet Take 500-1,000 mg by mouth daily as needed for mild pain or moderate pain.     [name] albuterol (PROVENTIL HFA;VENTOLIN HFA) 108 (90 Base) MCG/ACT inhaler Inhale 2 puffs into the lungs every 4 (four) hours as needed for wheezing or shortness of breath. 1 Inhaler 1  . budesonide-formoterol (SYMBICORT) 160-4.5 MCG/ACT inhaler Inhale 2 puffs into the lungs 2 (two) times daily. 1 Inhaler 0  . cyanocobalamin (,VITAMIN B-12,) 1000 MCG/ML injection IM daily 11/29-12/3, then weekly x4, then monthly. 9 mL 0  . diltiazem (CARDIZEM CD) 120 MG 24 hr capsule Take 1 capsule (120 mg total) by mouth daily. 30 capsule 5  . furosemide (LASIX) 40 MG tablet Take 1 tablet (40 mg total) by mouth daily. 30 tablet 5  . gabapentin (NEURONTIN) 100 MG capsule TAKE 1 CAPSULE BY MOUTH THREE TIMES A DAY. 270 capsule 0  . metoprolol (LOPRESSOR) 50 MG tablet TAKE 1 TABLET BY MOUTH TWICE DAILY. 180 tablet 0  . pantoprazole (PROTONIX) 40 MG tablet Take 1 tablet (40 mg total) by mouth 2 (two) times daily. 180 tablet 0  . Pediatric Multivitamins-Iron (FLINTSTONES PLUS IRON) chewable tablet Chew 2 tablets by mouth daily. 60 tablet 0  . simvastatin (ZOCOR) 20 MG tablet TAKE (1) TABLET BY MOUTH AT BEDTIME. 90 tablet 0  . temazepam  (RESTORIL) 15 MG capsule TAKE 1 CAPSULE BY MOUTH AT BEDTIME. 30 capsule 0  . traMADol (ULTRAM) 50 MG tablet TAKE (1) TABLET BY MOUTH EVERY EIGHT HOURS. 90 tablet 0  . warfarin (COUMADIN) 5 MG tablet TAKE 1/2 TABLET BY MOUTH ON WEDNESDAY AND 1 TABLET ALL OTHER DAYS. 90 tablet 0   No current facility-administered medications on file prior to visit.        Objective:   Physical Exam Blood pressure (!) 150/54, pulse 60, temperature 98.5 F (36.9 C), height 5\' 2"  (1.575 m), weight 136 lb (61.7 kg).  Alert and oriented. Skin warm and dry. Oral mucosa is moist.   . Sclera anicteric, conjunctivae is pink. Thyroid not enlarged. No cervical  lymphadenopathy. Lungs clear. Heart regular rate and rhythm. Loud murmur heard Abdomen is soft. Bowel sounds are positive. No hepatomegaly. No abdominal masses felt. No tenderness.  No edema to lower extremities.         Assessment & Plan:  Dysphagia. She occasionally has some dysphagia.  Occasionally melena in the setting of anti\coagulation.  CBC today.  Keep appt with Dr. Stephanie Acre. (I discussed with Dr. Laural Golden). OV as needed

## 2016-07-27 ENCOUNTER — Ambulatory Visit (HOSPITAL_COMMUNITY): Payer: Medicare Other

## 2016-07-27 DIAGNOSIS — R262 Difficulty in walking, not elsewhere classified: Secondary | ICD-10-CM

## 2016-07-27 DIAGNOSIS — R2681 Unsteadiness on feet: Secondary | ICD-10-CM

## 2016-07-27 DIAGNOSIS — M545 Low back pain: Principal | ICD-10-CM

## 2016-07-27 DIAGNOSIS — G8929 Other chronic pain: Secondary | ICD-10-CM

## 2016-07-27 DIAGNOSIS — M6281 Muscle weakness (generalized): Secondary | ICD-10-CM

## 2016-07-27 NOTE — Therapy (Signed)
[facility], [place], [address] Phone: [phone]   Fax:  [phone]  Physical Therapy Treatment  Patient Details  Name: [name] MRN: [id number] Date of Birth: 1940-10-27 Referring Provider: Velvet Bathe   Encounter Date: 07/27/2016      PT End of Session - 07/27/16 0917    Visit Number 4   Number of Visits 8   Date for PT Re-Evaluation 08/09/16   Authorization Type Medicare    Authorization Time Period 07/12/16 to 08/12/16   Authorization - Visit Number 4   Authorization - Number of Visits 10   PT Start Time 0910   PT Stop Time 0943   PT Time Calculation (min) 33 min   Activity Tolerance Patient tolerated treatment well   Behavior During Therapy [facility] for tasks assessed/performed      Past Medical History:  Diagnosis Date  . Anemia 2005   Generally microcytic, transfusions in [address], 2012, 02/2015, 05/2015  . Aortic stenosis    Moderate November 2017  . Arthritis   . Broken back 2013   Chronic back pain.   [name] CAD (coronary artery disease)    Cardiac catheterization 2014 - 80% mid RCA and 70% OM managed medically  . CHF (congestive heart failure) ([place])   . Contrast media allergy   . Diastolic heart failure ([place])   . Esophageal cancer ([place]) 05/06/2015   Adenocarcinoma GE junction  . GERD (gastroesophageal reflux disease)   . H/O iron deficiency    05-06-15 iron infusion ([facility])  . HH (hiatus hernia) 2008   Large with associated erosions  . Hypertension   . Paroxysmal atrial fibrillation ([name])   . PONV (postoperative nausea and vomiting)   . Pulmonary emboli ([place]) 2008, 2012  . Stroke ([place])   . Transfusion history    2 units transfused 05-06-15 ([facility])    Past Surgical History:  Procedure Laterality Date  . APPENDECTOMY  1950s  . BACK SURGERY    . CARPAL TUNNEL RELEASE Bilateral 1990s  . [place] SURGERY  2014  . CHOLECYSTECTOMY  1980s   open  . COLONOSCOPY N/A 02/17/2015    Procedure: COLONOSCOPY;  Surgeon: [name], MD;  Location: [facility] ENDOSCOPY;  Service: Endoscopy;  Laterality: N/A;  . ESOPHAGOGASTRODUODENOSCOPY N/A 02/16/2015   Procedure: ESOPHAGOGASTRODUODENOSCOPY (EGD);  Surgeon: [name], MD;  Location: [facility];  Service: Endoscopy;  Laterality: N/A;  . ESOPHAGOGASTRODUODENOSCOPY N/A 05/06/2015   Procedure: ESOPHAGOGASTRODUODENOSCOPY (EGD);  Surgeon: [name], MD;  Location: [facility] ENDOSCOPY;  Service: Endoscopy;  Laterality: N/A;  . EUS N/A 05/20/2015   Procedure: UPPER ENDOSCOPIC ULTRASOUND (EUS) LINEAR;  Surgeon: [name], MD;  Location: [facility] ENDOSCOPY;  Service: Endoscopy;  Laterality: N/A;  . exploratory lab  1950s or 1960s  . GIVENS CAPSULE STUDY N/A 05/06/2015   Procedure: GIVENS CAPSULE STUDY;  Surgeon: [name], MD;  Location[facility] ENDOSCOPY;  Service: Endoscopy;  Laterality: N/A;  . lumbar back surgery  2012  . [place] SURGERY  1991  . TONSILLECTOMY    . TONSILLECTOMY AND ADENOIDECTOMY  1960s    There were no vitals filed for this visit.      Subjective Assessment - 07/27/16 0910    Subjective Pt stated she continues to have sore achieness lower back more Rt > Lt   Pertinent History on active coumadin, A-fib, HTN, aortic stenosis, BPPV, macular degeneration, history of PE, history of broken back after fall 2013, CVA in  1982, lumbar disc surgery   Patient Stated Goals increase balance and ability to perform functional tasks throughout her day    Currently in Pain? Yes   Pain Score 5    Pain Location Back   Pain Orientation Right;Left;Lower   Pain Descriptors / Indicators Aching;Dull;Sore   Pain Type Chronic pain   Pain Radiating Towards none   Pain Onset More than a month ago   Pain Frequency Constant   Aggravating Factors  doing too much   Pain Relieving Factors sitting and resting   Effect of Pain on Daily Activities severe impact on ADLs                         [facility] Adult PT  Treatment/Exercise - 07/27/16 0001      Lumbar Exercises: Stretches   Active Hamstring Stretch 3 reps;30 seconds   Active Hamstring Stretch Limitations seated      Lumbar Exercises: Seated   LAQ on Chair Limitations Ab sets on chair 5x 5" with cueing for technique/form   LAQ on Ball Limitations 5x thoracic excursion with arms crossed on chest   Hip Flexion on Ball Both;5 reps   Hip Flexion on Ball Limitations sitting on dynadisc alternating marching 5x5"   Sit to Stand 5 reps   Sit to Stand Limitations no UE's                  PT Short Term Goals - 07/12/16 1453      PT SHORT TERM GOAL #1   Title Patient to demonstrate only mild (20-30%) limitation in lumbar motion on all planes in order to improve mechanics and reduce pain    Time 2   Period Weeks   Status New     PT SHORT TERM GOAL #2   Title Patient to demonstrate at leaset a 50% improvement in B hip flexilbity and ROM in order to improve mechanics and reduce pain    Time 2   Period Weeks   Status New     PT SHORT TERM GOAL #3   Title Patient to be able to maintain correct posture at least 75% of the time in order to improve mechanics and task performance    Time 2   Period Weeks   Status New     PT SHORT TERM GOAL #4   Title Patient to be independent in correctly and consistently performing appropriate HEP, to be updated PRN    Time 1   Period Weeks   Status New           PT Long Term Goals - 07/12/16 1457      PT LONG TERM GOAL #1   Title Patient to demonstrate functional strength 4/5 in order to reduce pain and improve mechanics    Time 4   Period Weeks   Status New     PT LONG TERM GOAL #2   Title Patient to be able to tolerate functional task performance for 20-30 minutes without pain exacerbation in order to improve QOL and independence    Time 4   Period Weeks   Status New     PT LONG TERM GOAL #3   Title Patient to be able to complete TUG test in 12 seconds or less with no device in  order to show improved overall mobility    Time 4   Period Weeks   Status New     PT LONG TERM GOAL #4  Title Patient to experience pain no more than 3/10 in order to improve QOL and functional task performance    Time 4   Period Weeks   Status New               Plan - 07/27/16 [id number]    Clinical Impression Statement Pt reports increased pain with supine position.  Pt arrived with hyperkyphotic posture.  Pt educated on importance of proper posture for pain control, included figure for visual cueing.  Core and proximal strengthening complete on dynadisc this session.  Added thoracic excursion  to improve spinal mobility.  Pt with improved tolerance this session with seated exercises vs supine though does continue to complain of LBP scale 5-6/10.  Reports compliance with HEP daily.   Rehab Potential Fair   Clinical Impairments Affecting Rehab Potential (+) appears motivated to participate in PT; (-) multiple co-morbidities present including cardiac factors    PT Frequency 2x / week   PT Duration 4 weeks   PT Treatment/Interventions ADLs/Self Care Home Management;Biofeedback;Cryotherapy;Moist Heat;DME Instruction;Gait training;Stair training;Functional mobility training;Therapeutic activities;Therapeutic exercise;Balance training;Neuromuscular re-education;Patient/family education;Manual techniques;Passive range of motion;Energy conservation;Taping;Dry needling   PT Next Visit Plan Continue with seated and standing activity to see if better response versus supine.  Progress towards established goals.      Patient will benefit from skilled therapeutic intervention in order to improve the following deficits and impairments:  Abnormal gait, Improper body mechanics, Pain, Decreased coordination, Decreased mobility, Increased muscle spasms, Postural dysfunction, Decreased activity tolerance, Decreased range of motion, Decreased strength, Decreased balance, Difficulty walking, Impaired  flexibility  Visit Diagnosis: Chronic midline low back pain without sciatica  Muscle weakness (generalized)  Difficulty in walking, not elsewhere classified  Unsteadiness on feet     Problem List Patient Active Problem List   Diagnosis Date Noted  . Foot sprain, left, initial encounter 07/10/2016  . Hyperglycemia 06/26/2016  . Atrial fibrillation with RVR ([place]) 06/23/2016  . Acute bronchitis 06/22/2016  . Encounter for medication review and counseling 06/07/2016  . Anemia 05/28/2016  . Essential hypertension, benign 04/19/2016  . Mild aortic stenosis 09/28/2015  . Chest pain at rest 09/05/2015  . GE junction carcinoma ([place]) 05/14/2015  . [place] lesion, acute   . IDA (iron deficiency anemia)   . Occult GI bleeding   . Nausea vomiting and diarrhea 05/05/2015  . Symptomatic anemia 05/05/2015  . HLD (hyperlipidemia) 05/05/2015  . Abdominal pain 05/05/2015  . GERD (gastroesophageal reflux disease) 05/05/2015  . Absolute anemia   . Anticoagulated on Coumadin   . Osteoarthritis 04/16/2015  . History of pulmonary embolism 03/02/2015  . Shortness of breath 02/22/2015  . Benign neoplasm of descending colon 02/17/2015  . Diverticulosis of large intestine without diverticulitis 02/17/2015  . Esophageal stricture 02/16/2015  . UGI bleed 02/13/2015  . Supratherapeutic INR 02/13/2015  . Encounter for therapeutic drug monitoring 10/15/2014  . Thoracic back pain 10/15/2014  . Macular degeneration 09/01/2014  . Pneumonia 08/27/2014  . Benign paroxysmal positional vertigo   . Hypertension 08/24/2014  . Back pain 08/03/2014  . Complaints of total body pain 08/03/2014  . Sleep disturbance 08/03/2014  . Aortic stenosis 03/05/2014  . PAF (paroxysmal atrial fibrillation) ([place]) 01/21/2014  . Long term current use of anticoagulant therapy 01/21/2014  . Chronic diastolic congestive heart failure ([place]) 01/21/2014  . Chest pain 01/21/2014   Ihor Austin, [place];  [place]  Aldona Lento 07/27/2016, 9:44 AM  [facility] [address]  [place], [place], [address] Phone: [phone]   Fax:  [phone]  Name: [name] MRN: [id number] Date of Birth: 1941/06/19

## 2016-07-28 ENCOUNTER — Ambulatory Visit (INDEPENDENT_AMBULATORY_CARE_PROVIDER_SITE_OTHER): Payer: 59 | Admitting: Podiatry

## 2016-07-28 ENCOUNTER — Ambulatory Visit (INDEPENDENT_AMBULATORY_CARE_PROVIDER_SITE_OTHER): Payer: Medicare Other

## 2016-07-28 ENCOUNTER — Encounter: Payer: Self-pay | Admitting: Podiatry

## 2016-07-28 VITALS — BP 173/66 | HR 54 | Resp 16 | Ht 62.0 in | Wt 137.0 lb

## 2016-07-28 DIAGNOSIS — M779 Enthesopathy, unspecified: Secondary | ICD-10-CM

## 2016-07-28 DIAGNOSIS — B351 Tinea unguium: Secondary | ICD-10-CM

## 2016-07-28 DIAGNOSIS — M25572 Pain in left ankle and joints of left foot: Secondary | ICD-10-CM

## 2016-07-28 DIAGNOSIS — M7672 Peroneal tendinitis, left leg: Secondary | ICD-10-CM

## 2016-07-28 MED ORDER — TRIAMCINOLONE ACETONIDE 10 MG/ML IJ SUSP
10.0000 mg | Freq: Once | INTRAMUSCULAR | Status: AC
  Administered 2016-07-28: 10 mg

## 2016-07-28 NOTE — Progress Notes (Signed)
Subjective:     Patient ID: [name], female   DOB: 1941/01/09, 76 y.o.   MRN: [id number]  HPI patient presents stating I have had pain on the outside of my left foot for the last few months and I don't remember specific injury   Review of Systems  All other systems reviewed and are negative.      Objective:   Physical Exam  Constitutional: She is oriented to person, place, and time.  Cardiovascular: Intact distal pulses.   Musculoskeletal: Normal range of motion.  Neurological: She is oriented to person, place, and time.  Skin: Skin is warm.  Nursing note and vitals reviewed.  neurovascular status intact muscle strength adequate range of motion within normal limits with patient found to have pain in the lateral side of the left peroneal tendon group with inflammation fluid buildup that's localized in nature with patient noted to have good digital perfusion and well oriented 3. Patient is found to have normal muscle function with no indication of tear     Assessment:     Peroneal tendinitis left with inflammation    Plan:     H&P x-rays reviewed condition discussed. Today I did a careful sheath injection lateral 3 mg Kenalog 5 mill grams Xylocaine and advised on physical therapy and dispensed fascial brace to lift the lateral side of the foot. Patient be seen back in the next 3 weeks or earlier if needed  Multiple view x-rays of foot and ankle negative for signs of arthritis stress fracture or other bony pathology

## 2016-07-28 NOTE — Progress Notes (Signed)
   Subjective:    Patient ID: [name], female    DOB: 10/11/1940, 76 y.o.   MRN: [id number]  HPI  Chief Complaint  Patient presents with  . Foot Pain    Left; Top of foot; Lateral Side pain radiates to front of ankle x 1 month. Pt states that there is also a rash on the lateral side of left foot. Also had a X-ray done on the left foot through PCP.        Review of Systems     Objective:   Physical Exam        Assessment & Plan:

## 2016-07-31 ENCOUNTER — Ambulatory Visit (HOSPITAL_COMMUNITY): Payer: Medicare Other | Admitting: Physical Therapy

## 2016-07-31 DIAGNOSIS — M545 Low back pain: Secondary | ICD-10-CM | POA: Diagnosis not present

## 2016-07-31 DIAGNOSIS — R2681 Unsteadiness on feet: Secondary | ICD-10-CM

## 2016-07-31 DIAGNOSIS — M6281 Muscle weakness (generalized): Secondary | ICD-10-CM | POA: Diagnosis not present

## 2016-07-31 DIAGNOSIS — G8929 Other chronic pain: Secondary | ICD-10-CM | POA: Diagnosis not present

## 2016-07-31 DIAGNOSIS — R262 Difficulty in walking, not elsewhere classified: Secondary | ICD-10-CM

## 2016-07-31 NOTE — Therapy (Signed)
[name], [place], [address] Phone: [phone]   Fax:  [phone]  Physical Therapy Treatment  Patient Details  Name: [name] MRN: [id number] Date of Birth: [birth year] Referring Provider: Velvet Bathe   Encounter Date: 07/31/2016      PT End of Session - 07/31/16 1200    Visit Number 5   Number of Visits 8   Date for PT Re-Evaluation 08/09/16   Authorization Type Medicare    Authorization Time Period 07/12/16 to 08/12/16   Authorization - Visit Number 5   Authorization - Number of Visits 10   PT Start Time [id number]   PT Stop Time 1154   PT Time Calculation (min) 39 min   Activity Tolerance Patient tolerated treatment well;No increased pain   Behavior During Therapy WFL for tasks assessed/performed      Past Medical History:  Diagnosis Date  . Anemia 2005   Generally microcytic, transfusions in [address], 2012, 02/2015, 05/2015  . Aortic stenosis    Moderate November 2017  . Arthritis   . Broken back 2013   Chronic back pain.   [name] CAD (coronary artery disease)    Cardiac catheterization 2014 - 80% mid RCA and 70% OM managed medically  . CHF (congestive heart failure) ([place])   . Contrast media allergy   . Diastolic heart failure ([place])   . Esophageal cancer ([place]) 05/06/2015   Adenocarcinoma GE junction  . GERD (gastroesophageal reflux disease)   . H/O iron deficiency    05-06-15 iron infusion ([facility])  . HH (hiatus hernia) 2008   Large with associated erosions  . Hypertension   . Paroxysmal atrial fibrillation ([name])   . PONV (postoperative nausea and vomiting)   . Pulmonary emboli ([place]) 2008, 2012  . Stroke ([place])   . Transfusion history    2 units transfused 05-06-15 ([facility])    Past Surgical History:  Procedure Laterality Date  . APPENDECTOMY  1950s  . BACK SURGERY    . CARPAL TUNNEL RELEASE Bilateral 1990s  . [place] SURGERY  2014  . CHOLECYSTECTOMY  1980s   open  . COLONOSCOPY N/A  02/17/2015   Procedure: COLONOSCOPY;  Surgeon: [name], MD;  Location: [facility] ENDOSCOPY;  Service: Endoscopy;  Laterality: N/A;  . ESOPHAGOGASTRODUODENOSCOPY N/A 02/16/2015   Procedure: ESOPHAGOGASTRODUODENOSCOPY (EGD);  Surgeon: [name], MD;  Location: [facility];  Service: Endoscopy;  Laterality: N/A;  . ESOPHAGOGASTRODUODENOSCOPY N/A 05/06/2015   Procedure: ESOPHAGOGASTRODUODENOSCOPY (EGD);  Surgeon: [name], MD;  Location: [facility] ENDOSCOPY;  Service: Endoscopy;  Laterality: N/A;  . EUS N/A 05/20/2015   Procedure: UPPER ENDOSCOPIC ULTRASOUND (EUS) LINEAR;  Surgeon: [name], MD;  Location: [facility] ENDOSCOPY;  Service: Endoscopy;  Laterality: N/A;  . exploratory lab  1950s or 1960s  . GIVENS CAPSULE STUDY N/A 05/06/2015   Procedure: GIVENS CAPSULE STUDY;  Surgeon: [name], MD;  Location: [facility] ENDOSCOPY;  Service: Endoscopy;  Laterality: N/A;  . lumbar back surgery  2012  . [place] SURGERY  1991  . TONSILLECTOMY    . TONSILLECTOMY AND ADENOIDECTOMY  1960s    There were no vitals filed for this visit.      Subjective Assessment - 07/31/16 1120    Subjective Pt reports that she is having a "bad day". She has tendonitis in her hand and in her foot. Her back is also really sore, likely from the weather. She states she continues to perform her HEP at  home.    Pertinent History on active coumadin, A-fib, HTN, aortic stenosis, BPPV, macular degeneration, history of PE, history of broken back after fall 2013, CVA in 1982, lumbar disc surgery   Patient Stated Goals increase balance and ability to perform functional tasks throughout her day    Currently in Pain? Yes   Pain Score 5    Pain Location Back   Pain Orientation Right;Lower   Pain Descriptors / Indicators Aching   Pain Type Chronic pain   Pain Radiating Towards none    Pain Onset More than a month ago                         [facility] Adult PT Treatment/Exercise - 07/31/16 0001      Exercises    Exercises Other Exercises   Other Exercises  seated ab set x10 reps, 5 sec hold with UE support on thighs;  seated deadbug marching 2x5 each;  hip flexor stretch with standing lunge 2x20 sec;  seated hamstring curls x10 reps, red TB on each      Lumbar Exercises: Stretches   Passive Hamstring Stretch 3 reps;30 seconds   Passive Hamstring Stretch Limitations 12" box    Single Knee to Chest Stretch 5 reps;10 seconds   Single Knee to Chest Stretch Limitations supine      Lumbar Exercises: Supine   Bridge 10 reps   Bridge Limitations attempted with verbal cues for increased glute activation   Other Supine Lumbar Exercises low trunk rotation x10 reps                 PT Education - 07/31/16 1146    Education provided Yes   Education Details discussed importance of avoiding prolong sleeping in her recliner and resulting hip flexor tightness during posture; Discussed ways to position in prone to avoid increasing pain          PT Short Term Goals - 07/12/16 1453      PT SHORT TERM GOAL #1   Title Patient to demonstrate only mild (20-30%) limitation in lumbar motion on all planes in order to improve mechanics and reduce pain    Time 2   Period Weeks   Status New     PT SHORT TERM GOAL #2   Title Patient to demonstrate at [place] a 50% improvement in B hip flexilbity and ROM in order to improve mechanics and reduce pain    Time 2   Period Weeks   Status New     PT SHORT TERM GOAL #3   Title Patient to be able to maintain correct posture at least 75% of the time in order to improve mechanics and task performance    Time 2   Period Weeks   Status New     PT SHORT TERM GOAL #4   Title Patient to be independent in correctly and consistently performing appropriate HEP, to be updated PRN    Time 1   Period Weeks   Status New           PT Long Term Goals - 07/12/16 1457      PT LONG TERM GOAL #1   Title Patient to demonstrate functional strength 4/5 in order to  reduce pain and improve mechanics    Time 4   Period Weeks   Status New     PT LONG TERM GOAL #2   Title Patient to be able to tolerate functional task performance for  20-30 minutes without pain exacerbation in order to improve QOL and independence    Time 4   Period Weeks   Status New     PT LONG TERM GOAL #3   Title Patient to be able to complete TUG test in 12 seconds or less with no device in order to show improved overall mobility    Time 4   Period Weeks   Status New     PT LONG TERM GOAL #4   Title Patient to experience pain no more than 3/10 in order to improve QOL and functional task performance    Time 4   Period Weeks   Status New               Plan - 07/31/16 1201    Clinical Impression Statement Today's session focused on activity to improve trunk and LE strength and flexibility. Pt initially reporting increased pain with supine positioning for activity however she demonstrated good technique with exercises and showing no visual signs of increased pain/symptoms by the end of this. She was unwilling to position in prone, despite therapist reassurance of positioning techniques, and this limited further evaluation of muscle spasm/discomfort in her low back. Ended session with standing stretches to improve postural alignment and discussed with her the importance of avoiding habits of sleeping in her recliner. She was not receptive of this recommendation at this time.    Rehab Potential Fair   Clinical Impairments Affecting Rehab Potential (+) appears motivated to participate in PT; (-) multiple co-morbidities present including cardiac factors    PT Frequency 2x / week   PT Duration 4 weeks   PT Treatment/Interventions ADLs/Self Care Home Management;Biofeedback;Cryotherapy;Moist Heat;DME Instruction;Gait training;Stair training;Functional mobility training;Therapeutic activities;Therapeutic exercise;Balance training;Neuromuscular re-education;Patient/family  education;Manual techniques;Passive range of motion;Energy conservation;Taping;Dry needling   PT Next Visit Plan hip flexor stretch, proximal strengthening; Progress towards established goals.   PT Home Exercise Plan Eval: SKTC, TA sets    Consulted and Agree with Plan of Care Patient      Patient will benefit from skilled therapeutic intervention in order to improve the following deficits and impairments:  Abnormal gait, Improper body mechanics, Pain, Decreased coordination, Decreased mobility, Increased muscle spasms, Postural dysfunction, Decreased activity tolerance, Decreased range of motion, Decreased strength, Decreased balance, Difficulty walking, Impaired flexibility  Visit Diagnosis: Chronic midline low back pain without sciatica  Muscle weakness (generalized)  Difficulty in walking, not elsewhere classified  Unsteadiness on feet     Problem List Patient Active Problem List   Diagnosis Date Noted  . Foot sprain, left, initial encounter 07/10/2016  . Hyperglycemia 06/26/2016  . Atrial fibrillation with RVR ([place]) 06/23/2016  . Acute bronchitis 06/22/2016  . Encounter for medication review and counseling 06/07/2016  . Anemia 05/28/2016  . Essential hypertension, benign 04/19/2016  . Mild aortic stenosis 09/28/2015  . Chest pain at rest 09/05/2015  . 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 [place] lesion, acute   . IDA (iron deficiency anemia)   . Occult GI bleeding   . Nausea vomiting and diarrhea 05/05/2015  . Symptomatic anemia 05/05/2015  . HLD (hyperlipidemia) 05/05/2015  . Abdominal pain 05/05/2015  . GERD (gastroesophageal reflux disease) 05/05/2015  . Absolute anemia   . Anticoagulated on Coumadin   . Osteoarthritis 04/16/2015  . History of pulmonary embolism 03/02/2015  . Shortness of breath 02/22/2015  . Benign neoplasm of descending colon 02/17/2015  . Diverticulosis of large intestine without diverticulitis 02/17/2015  . Esophageal stricture  02/16/2015  .  UGI bleed 02/13/2015  . Supratherapeutic INR 02/13/2015  . Encounter for therapeutic drug monitoring 10/15/2014  . Thoracic back pain 10/15/2014  . Macular degeneration 09/01/2014  . Pneumonia 08/27/2014  . Benign paroxysmal positional vertigo   . Hypertension 08/24/2014  . Back pain 08/03/2014  . Complaints of total body pain 08/03/2014  . Sleep disturbance 08/03/2014  . Aortic stenosis 03/05/2014  . PAF (paroxysmal atrial fibrillation) ([place]) 01/21/2014  . Long term current use of anticoagulant therapy 01/21/2014  . Chronic diastolic congestive heart failure ([place]) 01/21/2014  . Chest pain 01/21/2014    12:08 PM,07/31/16 Elly Modena PT, DPT [facility] [place] [address] [place], [place], [address] Phone: [phone]   Fax:  [phone]  Name: SAKORA CONCA MRN: [id number] Date of Birth: 1940/10/13

## 2016-08-02 ENCOUNTER — Ambulatory Visit (HOSPITAL_COMMUNITY): Payer: Medicare Other | Admitting: Physical Therapy

## 2016-08-02 DIAGNOSIS — G8929 Other chronic pain: Secondary | ICD-10-CM | POA: Diagnosis not present

## 2016-08-02 DIAGNOSIS — R2681 Unsteadiness on feet: Secondary | ICD-10-CM | POA: Diagnosis not present

## 2016-08-02 DIAGNOSIS — M545 Low back pain: Secondary | ICD-10-CM | POA: Diagnosis not present

## 2016-08-02 DIAGNOSIS — R262 Difficulty in walking, not elsewhere classified: Secondary | ICD-10-CM

## 2016-08-02 DIAGNOSIS — M6281 Muscle weakness (generalized): Secondary | ICD-10-CM

## 2016-08-02 NOTE — Therapy (Signed)
[facility] [address] [place], [place], [address] Phone: [phone]   Fax:  [phone]  Physical Therapy Treatment (Discharge)  Patient Details  Name: [name] MRN: [id number] Date of Birth: Dec 06, 1940 Referring Provider: Velvet Bathe   Encounter Date: 08/02/2016      PT End of Session - 08/02/16 1201    Visit Number 6   Number of Visits 6   Authorization Type Medicare    Authorization Time Period 07/12/16 to 08/12/16   Authorization - Visit Number 6   Authorization - Number of Visits 10   PT Start Time 1130  patient arrived late    PT Stop Time 1156   PT Time Calculation (min) 26 min   Activity Tolerance Patient tolerated treatment well   Behavior During Therapy [facility] for tasks assessed/performed      Past Medical History:  Diagnosis Date  . Anemia 2005   Generally microcytic, transfusions in [address], 2012, 02/2015, 05/2015  . Aortic stenosis    Moderate November 2017  . Arthritis   . Broken back 2013   Chronic back pain.   [name] CAD (coronary artery disease)    Cardiac catheterization 2014 - 80% mid RCA and 70% OM managed medically  . CHF (congestive heart failure) ([place])   . Contrast media allergy   . Diastolic heart failure ([place])   . Esophageal cancer ([place]) 05/06/2015   Adenocarcinoma GE junction  . GERD (gastroesophageal reflux disease)   . H/O iron deficiency    05-06-15 iron infusion ([facility])  . HH (hiatus hernia) 2008   Large with associated erosions  . Hypertension   . Paroxysmal atrial fibrillation ([name])   . PONV (postoperative nausea and vomiting)   . Pulmonary emboli ([place]) 2008, 2012  . Stroke ([place])   . Transfusion history    2 units transfused 05-06-15 ([facility])    Past Surgical History:  Procedure Laterality Date  . APPENDECTOMY  1950s  . BACK SURGERY    . CARPAL TUNNEL RELEASE Bilateral 1990s  . [place] SURGERY  2014  . CHOLECYSTECTOMY  1980s   open  . COLONOSCOPY N/A 02/17/2015   Procedure:  COLONOSCOPY;  Surgeon: [name], MD;  Location[facility] ENDOSCOPY;  Service: Endoscopy;  Laterality: N/A;  . ESOPHAGOGASTRODUODENOSCOPY N/A 02/16/2015   Procedure: ESOPHAGOGASTRODUODENOSCOPY (EGD);  Surgeon: [name], MD;  Location: [facility];  Service: Endoscopy;  Laterality: N/A;  . ESOPHAGOGASTRODUODENOSCOPY N/A 05/06/2015   Procedure: ESOPHAGOGASTRODUODENOSCOPY (EGD);  Surgeon: [name], MD;  Location: [facility] ENDOSCOPY;  Service: Endoscopy;  Laterality: N/A;  . EUS N/A 05/20/2015   Procedure: UPPER ENDOSCOPIC ULTRASOUND (EUS) LINEAR;  Surgeon: [name], MD;  Location: [facility] ENDOSCOPY;  Service: Endoscopy;  Laterality: N/A;  . exploratory lab  1950s or 1960s  . GIVENS CAPSULE STUDY N/A 05/06/2015   Procedure: GIVENS CAPSULE STUDY;  Surgeon: [name], MD;  Location: [facility] ENDOSCOPY;  Service: Endoscopy;  Laterality: N/A;  . lumbar back surgery  2012  . [place] SURGERY  1991  . TONSILLECTOMY    . TONSILLECTOMY AND ADENOIDECTOMY  1960s    There were no vitals filed for this visit.      Subjective Assessment - 08/02/16 1131    Subjective Patient arrives today stating she is doing OK, she is still resistant to idea of getting on her stomach. She reports that she was sore after last session.    Pertinent History on active coumadin, A-fib, HTN, aortic stenosis, BPPV,  macular degeneration, history of PE, history of broken back after fall 2013, CVA in 1982, lumbar disc surgery   Patient Stated Goals increase balance and ability to perform functional tasks throughout her day    Currently in Pain? Yes   Pain Score 5    Pain Location Other (Comment)  back and her L hand/foot                          [facility] Adult PT Treatment/Exercise - 08/02/16 0001      Lumbar Exercises: Seated   LAQ on Chair Limitations ab sets on mat table, 10x5 second holds, cues for form  solid surface    LAQ on Ball Limitations 10x thoracic excursions with UEs crossed on chest    solid surface    Hip Flexion on Ball Both;5 reps   Hip Flexion on Ball Limitations on dynadisc, alternating, 2 sets    Sit to Stand 5 reps   Sit to Stand Limitations no UEs, on dynadisc   2 sets              Balance Exercises - 08/02/16 1143      Balance Exercises: Standing   Standing Eyes Opened Narrow base of support (BOS);3 reps;30 secs;Solid surface   Tandem Stance Eyes open;3 reps;10 secs   SLS Eyes open;Solid surface;3 reps;Other (comment)  5 seconds    Tandem Gait Forward;Intermittent upper extremity support;4 reps;Other (comment)  40f in parallel bars            PT Education - 08/02/16 1200    Education provided Yes   Education Details DC today, she will need new MD order to return    Person(s) Educated Patient   Methods Explanation   Comprehension Verbalized understanding          PT Short Term Goals - 07/12/16 1453      PT SHORT TERM GOAL #1   Title Patient to demonstrate only mild (20-30%) limitation in lumbar motion on all planes in order to improve mechanics and reduce pain    Time 2   Period Weeks   Status New     PT SHORT TERM GOAL #2   Title Patient to demonstrate at [place] 50% improvement in B hip flexilbity and ROM in order to improve mechanics and reduce pain    Time 2   Period Weeks   Status New     PT SHORT TERM GOAL #3   Title Patient to be able to maintain correct posture at least 75% of the time in order to improve mechanics and task performance    Time 2   Period Weeks   Status New     PT SHORT TERM GOAL #4   Title Patient to be independent in correctly and consistently performing appropriate HEP, to be updated PRN    Time 1   Period Weeks   Status New           PT Long Term Goals - 07/12/16 1457      PT LONG TERM GOAL #1   Title Patient to demonstrate functional strength 4/5 in order to reduce pain and improve mechanics    Time 4   Period Weeks   Status New     PT LONG TERM GOAL #2   Title Patient to be  able to tolerate functional task performance for 20-30 minutes without pain exacerbation in order to improve QOL and independence    Time  4   Period Weeks   Status New     PT LONG TERM GOAL #3   Title Patient to be able to complete TUG test in 12 seconds or less with no device in order to show improved overall mobility    Time 4   Period Weeks   Status New     PT LONG TERM GOAL #4   Title Patient to experience pain no more than 3/10 in order to improve QOL and functional task performance    Time 4   Period Weeks   Status New               Plan - 2016-08-26 1201    Clinical Impression Statement Patient arrived late for session today. Continued somewhat with exercises/POC that was performed last session, however did progress exercises today via use of "super sets" with exercise. Fatigue noted with new exercise format however patient appears to be able to do this format without significant increase in pain today. Noted difficulty with thoracic excursions, multiple compensations present despite cues. Also worked on functional balance activities today as patient does report that balance is a significant concern for her- "it is definitely off". Patient states at end of session that today is her last day, she wishes to be discharged as "I have lots of other things going on that I need to take care of right now"; unable to perform DC assessment due to time limitations.    Rehab Potential Fair   Clinical Impairments Affecting Rehab Potential (+) appears motivated to participate in PT; (-) multiple co-morbidities present including cardiac factors    PT Next Visit Plan patient requests DC today   PT Home Exercise Plan Eval: SKTC, TA sets    Consulted and Agree with Plan of Care Patient      Patient will benefit from skilled therapeutic intervention in order to improve the following deficits and impairments:  Abnormal gait, Improper body mechanics, Pain, Decreased coordination, Decreased  mobility, Increased muscle spasms, Postural dysfunction, Decreased activity tolerance, Decreased range of motion, Decreased strength, Decreased balance, Difficulty walking, Impaired flexibility  Visit Diagnosis: Chronic midline low back pain without sciatica  Muscle weakness (generalized)  Difficulty in walking, not elsewhere classified  Unsteadiness on feet       G-Codes - 08-26-16 September 06, 1200    Functional Assessment Tool Used Based on skilled clincal assessment of strength, posture, gait, pain patterns, balance    Functional Limitation Mobility: Walking and moving around   Mobility: Walking and Moving Around Goal Status [phone]) At least 40 percent but less than 60 percent impaired, limited or restricted   Mobility: Walking and Moving Around Discharge Status [phone]) At least 60 percent but less than 80 percent impaired, limited or restricted      Problem List Patient Active Problem List   Diagnosis Date Noted  . Foot sprain, left, initial encounter 07/10/2016  . Hyperglycemia 06/26/2016  . Atrial fibrillation with RVR ([place]) 06/23/2016  . Acute bronchitis 06/22/2016  . Encounter for medication review and counseling 06/07/2016  . Anemia 05/28/2016  . Essential hypertension, benign 04/19/2016  . Mild aortic stenosis 09/28/2015  . Chest pain at rest 09/05/2015  . 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 [place] lesion, acute   . IDA (iron deficiency anemia)   . Occult GI bleeding   . Nausea vomiting and diarrhea 05/05/2015  . Symptomatic anemia 05/05/2015  . HLD (hyperlipidemia) 05/05/2015  . Abdominal pain 05/05/2015  . GERD (gastroesophageal reflux disease) 05/05/2015  .  Absolute anemia   . Anticoagulated on Coumadin   . Osteoarthritis 04/16/2015  . History of pulmonary embolism 03/02/2015  . Shortness of breath 02/22/2015  . Benign neoplasm of descending colon 02/17/2015  . Diverticulosis of large intestine without diverticulitis 02/17/2015  . Esophageal stricture  02/16/2015  . UGI bleed 02/13/2015  . Supratherapeutic INR 02/13/2015  . Encounter for therapeutic drug monitoring 10/15/2014  . Thoracic back pain 10/15/2014  . Macular degeneration 09/01/2014  . Pneumonia 08/27/2014  . Benign paroxysmal positional vertigo   . Hypertension 08/24/2014  . Back pain 08/03/2014  . Complaints of total body pain 08/03/2014  . Sleep disturbance 08/03/2014  . Aortic stenosis 03/05/2014  . PAF (paroxysmal atrial fibrillation) ([place]) 01/21/2014  . Long term current use of anticoagulant therapy 01/21/2014  . Chronic diastolic congestive heart failure ([place]) 01/21/2014  . Chest pain 01/21/2014    PHYSICAL THERAPY DISCHARGE SUMMARY  Visits from Start of Care: 6  Current functional level related to goals / functional outcomes: Patient stated at end of session that she would like to be discharged today; unable to complete formal DC assessment however based on functional performance during session DPT does not anticipate significant functional change. DC today per patient request.    Remaining deficits: Unable to assess due to time limitations    Education / Equipment: She will need new MD order to return to PT  Plan: Patient agrees to discharge.  Patient goals were not met. Patient is being discharged due to the patient's request.  ?????      Deniece Ree PT, DPT [facility], [place], [address] Phone: [phone]   Fax:  [phone]  Name: [name] MRN: [id number] Date of Birth: April 22, 1941

## 2016-08-08 ENCOUNTER — Ambulatory Visit (HOSPITAL_BASED_OUTPATIENT_CLINIC_OR_DEPARTMENT_OTHER): Payer: Medicare Other | Admitting: Oncology

## 2016-08-08 ENCOUNTER — Telehealth: Payer: Self-pay | Admitting: Oncology

## 2016-08-08 VITALS — BP 159/54 | HR 63 | Temp 98.5°F | Resp 18 | Ht 62.0 in | Wt 134.9 lb

## 2016-08-08 DIAGNOSIS — C160 Malignant neoplasm of cardia: Secondary | ICD-10-CM | POA: Diagnosis not present

## 2016-08-08 DIAGNOSIS — I10 Essential (primary) hypertension: Secondary | ICD-10-CM

## 2016-08-08 DIAGNOSIS — I480 Paroxysmal atrial fibrillation: Secondary | ICD-10-CM | POA: Diagnosis not present

## 2016-08-08 DIAGNOSIS — D509 Iron deficiency anemia, unspecified: Secondary | ICD-10-CM

## 2016-08-08 DIAGNOSIS — D649 Anemia, unspecified: Secondary | ICD-10-CM

## 2016-08-08 DIAGNOSIS — Z8673 Personal history of transient ischemic attack (TIA), and cerebral infarction without residual deficits: Secondary | ICD-10-CM

## 2016-08-08 DIAGNOSIS — Z86711 Personal history of pulmonary embolism: Secondary | ICD-10-CM

## 2016-08-08 NOTE — Progress Notes (Addendum)
[place] OFFICE PROGRESS NOTE   Diagnosis: Gastroesophageal carcinoma  INTERVAL HISTORY:   Ms. Ehrich underwent endoscopic submucosal resection of an intramucosal carcinoma of the gastric cardia with negative resection margins on 07/13/2015. The pathology confirmed an intramucosal adenocarcinoma. The inked deep margin was less than 1 mm. She is scheduled for a repeat upper endoscopy next month.  She is referred today for evaluation of recurrent anemia. She reports receiving multiple transfusions over the past year. No gross bleeding. She reports nausea when she takes standard over-the-counter iron supplements. She is now tolerating a "Flintstone "pediatric iron supplement twice daily. She was last transfused with red blood cells in November 2017.  A CBC on 06/27/2016 found the hemoglobin at 11.2 and MCV 82.6. The ferritin was measured at 29 with a TIBC of 298 and percent saturation of 8. The RBC folate returned at 1413 and vitamin B-12 at 924.  She become symptomatic with dyspnea prior to receiving red cell transfusions. She feels well today.  Review of systems: Positives-firmness in the left abdomen for the past month, increased "reflux "symptoms, left foot and right hand "tendinitis ", chronic back pain, cluster headaches, "broken "tooth-scheduled for extraction 08/10/2016  A complete review of systems was otherwise negative  Objective:  Vital signs in last 24 hours:  Blood pressure (!) 159/54, pulse 63, temperature 98.5 F (36.9 C), temperature source Oral, resp. rate 18, height 5\' 2"  (1.575 m), weight 134 lb 14.4 oz (61.2 kg), SpO2 98 %.    HEENT: The conjunctivae are pink, neck without mass Lymphatics: No cervical, supraclavicular, axillary, or inguinal nodes Resp: Lungs clear bilaterally Cardio: Regular rate and rhythm GI: No hepatosplenomegaly, mild diffuse tenderness, no abdominal wall mass Vascular: No leg edema    Portacath/PICC-without  erythema  Lab Results:  Lab Results  Component Value Date   WBC 5.9 06/27/2016   HGB 11.2 (L) 06/27/2016   HCT 35.7 (L) 06/27/2016   HCT 35.0 06/27/2016   MCV 82.6 06/27/2016   PLT 191 06/27/2016   NEUTROABS 8.2 (H) 06/23/2016   Blood smear 08/10/2016: The white cells appear normal. The platelet appear normal. Moderate ovalocytes.  Medications: I have reviewed the patient's current medications.  Assessment/Plan:  1. Intramucosal carcinoma of the gastric cardia-status post endoscopic resection 07/13/2015 with a less than 1 mm deep margin  Staging CT scans November 2016 with no tumor seen and no evidence of metastatic disease  2. History of recurrent anemia requiring red cell transfusions, last transfusion November 2017  3. Paroxysmal atrial fibrillation  4. History of pulmonary embolism  5. Gastroesophageal reflux disease  6. Aortic stenosis  7. Hypertension  8. History of a CVA  Disposition:  Ms. Darius is in clinical remission from gastric cancer after undergoing an endoscopic resection in January 2017. She is followed by GI at [facility] and reports being scheduled for a surveillance endoscopy next month.  She is referred for evaluation of recurrent anemia. The ferritin was in the low normal range on a CBC 06/27/2016. The anemia may be related to chronic GI blood loss related to the hiatal hernia or Coumadin anticoagulation.  Our laboratory was closed when she was in clinic today. She will return on 08/10/2016 for a CBC, iron studies, and peripheral blood smear. I will review the peripheral blood smear and initiate additional diagnostic evaluation as indicated.  I recommended she continue the "Flintstone "iron twice daily. She does not appear to have symptomatic anemia today.  Ms. Zinsmeister will return for an office  visit and CBC in 3 months.  40 minutes were spent with the patient today. The majority of the time was used for counseling and coordination of care.  Betsy Coder, MD  08/08/2016  4:51 PM

## 2016-08-08 NOTE — Telephone Encounter (Signed)
Appointments scheduled per 2/6 LOS. Patient given AVS report and calendars with future scheduled appointments. °

## 2016-08-10 ENCOUNTER — Other Ambulatory Visit (HOSPITAL_BASED_OUTPATIENT_CLINIC_OR_DEPARTMENT_OTHER): Payer: Medicare Other

## 2016-08-10 DIAGNOSIS — D509 Iron deficiency anemia, unspecified: Secondary | ICD-10-CM | POA: Diagnosis present

## 2016-08-10 LAB — CBC & DIFF AND RETIC
BASO%: 0.2 % (ref 0.0–2.0)
Basophils Absolute: 0 10*3/uL (ref 0.0–0.1)
EOS%: 1.8 % (ref 0.0–7.0)
Eosinophils Absolute: 0.1 10*3/uL (ref 0.0–0.5)
HCT: 40 % (ref 34.8–46.6)
HEMOGLOBIN: 13.2 g/dL (ref 11.6–15.9)
Immature Retic Fract: 5.5 % (ref 1.60–10.00)
LYMPH%: 28.7 % (ref 14.0–49.7)
MCH: 26.2 pg (ref 25.1–34.0)
MCHC: 33 g/dL (ref 31.5–36.0)
MCV: 79.5 fL (ref 79.5–101.0)
MONO#: 0.5 10*3/uL (ref 0.1–0.9)
MONO%: 8.1 % (ref 0.0–14.0)
NEUT%: 61.2 % (ref 38.4–76.8)
NEUTROS ABS: 3.7 10*3/uL (ref 1.5–6.5)
Platelets: 212 10*3/uL (ref 145–400)
RBC: 5.03 10*6/uL (ref 3.70–5.45)
RDW: 15.4 % — ABNORMAL HIGH (ref 11.2–14.5)
Retic %: 1.46 % (ref 0.70–2.10)
Retic Ct Abs: 73.44 10*3/uL (ref 33.70–90.70)
WBC: 6 10*3/uL (ref 3.9–10.3)
lymph#: 1.7 10*3/uL (ref 0.9–3.3)

## 2016-08-10 LAB — IRON AND TIBC
%SAT: 12 % — ABNORMAL LOW (ref 21–57)
Iron: 41 ug/dL (ref 41–142)
TIBC: 336 ug/dL (ref 236–444)
UIBC: 296 ug/dL (ref 120–384)

## 2016-08-10 LAB — FERRITIN: Ferritin: 15 ng/ml (ref 9–269)

## 2016-08-10 LAB — CHCC SMEAR

## 2016-08-10 LAB — DRAW EXTRA CLOT TUBE

## 2016-08-11 ENCOUNTER — Telehealth: Payer: Self-pay | Admitting: *Deleted

## 2016-08-11 ENCOUNTER — Ambulatory Visit (INDEPENDENT_AMBULATORY_CARE_PROVIDER_SITE_OTHER): Payer: Medicare Other | Admitting: General Practice

## 2016-08-11 ENCOUNTER — Other Ambulatory Visit: Payer: Self-pay | Admitting: General Practice

## 2016-08-11 DIAGNOSIS — Z5181 Encounter for therapeutic drug level monitoring: Secondary | ICD-10-CM

## 2016-08-11 DIAGNOSIS — I4891 Unspecified atrial fibrillation: Secondary | ICD-10-CM | POA: Diagnosis not present

## 2016-08-11 DIAGNOSIS — C160 Malignant neoplasm of cardia: Secondary | ICD-10-CM

## 2016-08-11 LAB — POCT INR: INR: 3.5

## 2016-08-11 MED ORDER — WARFARIN SODIUM 5 MG PO TABS: ORAL_TABLET | ORAL | 1 refills | Status: DC

## 2016-08-11 NOTE — Patient Instructions (Signed)
Pre visit review using our clinic review tool, if applicable. No additional management support is needed unless otherwise documented below in the visit note. 

## 2016-08-11 NOTE — Telephone Encounter (Signed)
Patient notified per Dr. Benay Spice that iron is low but not not anemic, continue iron-increase to ferrous sulfate 325 mg bid if she can, return in 6 weeks for labs and to f/u in office as scheduled, call for symptoms of anemia.  Patient states that she is not taking ferrous sulfate at this time and will start to take 325 mg bid as ordered per MD.  Notified pt that schedulers would call her for lab appt.  Patient appreciative of call and has no questions at this time.

## 2016-08-11 NOTE — Progress Notes (Signed)
I have reviewed and agree with the plan. 

## 2016-08-14 ENCOUNTER — Ambulatory Visit (INDEPENDENT_AMBULATORY_CARE_PROVIDER_SITE_OTHER): Payer: Medicare Other | Admitting: General Practice

## 2016-08-14 DIAGNOSIS — Z5181 Encounter for therapeutic drug level monitoring: Secondary | ICD-10-CM

## 2016-08-14 DIAGNOSIS — I4891 Unspecified atrial fibrillation: Secondary | ICD-10-CM

## 2016-08-14 LAB — POCT INR: INR: 3

## 2016-08-14 NOTE — Progress Notes (Signed)
I agree with this plan.

## 2016-08-14 NOTE — Patient Instructions (Signed)
Pre visit review using our clinic review tool, if applicable. No additional management support is needed unless otherwise documented below in the visit note. 

## 2016-08-16 ENCOUNTER — Telehealth: Payer: Self-pay | Admitting: Oncology

## 2016-08-16 NOTE — Telephone Encounter (Signed)
lvm to inform pt of lab only appt 3/19 at 11 am per LOS

## 2016-08-25 ENCOUNTER — Other Ambulatory Visit: Payer: Self-pay | Admitting: Family

## 2016-08-28 NOTE — Telephone Encounter (Signed)
Last refill was 07/12/16 

## 2016-08-28 NOTE — Telephone Encounter (Signed)
Faxed

## 2016-09-08 ENCOUNTER — Ambulatory Visit (INDEPENDENT_AMBULATORY_CARE_PROVIDER_SITE_OTHER): Payer: Medicare Other | Admitting: General Practice

## 2016-09-08 DIAGNOSIS — I4891 Unspecified atrial fibrillation: Secondary | ICD-10-CM

## 2016-09-08 DIAGNOSIS — Z5181 Encounter for therapeutic drug level monitoring: Secondary | ICD-10-CM

## 2016-09-08 LAB — POCT INR: INR: 2.3

## 2016-09-08 NOTE — Patient Instructions (Signed)
Pre visit review using our clinic review tool, if applicable. No additional management support is needed unless otherwise documented below in the visit note. 

## 2016-09-08 NOTE — Progress Notes (Signed)
I have reviewed and agree with the plan. 

## 2016-09-11 DIAGNOSIS — K449 Diaphragmatic hernia without obstruction or gangrene: Secondary | ICD-10-CM | POA: Diagnosis not present

## 2016-09-11 DIAGNOSIS — I350 Nonrheumatic aortic (valve) stenosis: Secondary | ICD-10-CM | POA: Diagnosis not present

## 2016-09-11 DIAGNOSIS — C169 Malignant neoplasm of stomach, unspecified: Secondary | ICD-10-CM | POA: Diagnosis not present

## 2016-09-11 DIAGNOSIS — I110 Hypertensive heart disease with heart failure: Secondary | ICD-10-CM | POA: Diagnosis not present

## 2016-09-11 DIAGNOSIS — Z8673 Personal history of transient ischemic attack (TIA), and cerebral infarction without residual deficits: Secondary | ICD-10-CM | POA: Diagnosis not present

## 2016-09-11 DIAGNOSIS — Z79899 Other long term (current) drug therapy: Secondary | ICD-10-CM | POA: Diagnosis not present

## 2016-09-11 DIAGNOSIS — I480 Paroxysmal atrial fibrillation: Secondary | ICD-10-CM | POA: Diagnosis not present

## 2016-09-11 DIAGNOSIS — K219 Gastro-esophageal reflux disease without esophagitis: Secondary | ICD-10-CM | POA: Diagnosis not present

## 2016-09-11 DIAGNOSIS — I5030 Unspecified diastolic (congestive) heart failure: Secondary | ICD-10-CM | POA: Diagnosis not present

## 2016-09-11 DIAGNOSIS — K3189 Other diseases of stomach and duodenum: Secondary | ICD-10-CM | POA: Diagnosis not present

## 2016-09-11 DIAGNOSIS — C160 Malignant neoplasm of cardia: Secondary | ICD-10-CM | POA: Diagnosis not present

## 2016-09-11 DIAGNOSIS — G629 Polyneuropathy, unspecified: Secondary | ICD-10-CM | POA: Diagnosis not present

## 2016-09-18 ENCOUNTER — Other Ambulatory Visit: Payer: Medicare Other

## 2016-09-18 DIAGNOSIS — K210 Gastro-esophageal reflux disease with esophagitis: Secondary | ICD-10-CM | POA: Diagnosis not present

## 2016-09-18 DIAGNOSIS — K449 Diaphragmatic hernia without obstruction or gangrene: Secondary | ICD-10-CM | POA: Diagnosis not present

## 2016-09-21 ENCOUNTER — Ambulatory Visit (INDEPENDENT_AMBULATORY_CARE_PROVIDER_SITE_OTHER): Payer: Medicare Other | Admitting: Pharmacist Clinician (PhC)/ Clinical Pharmacy Specialist

## 2016-09-21 ENCOUNTER — Encounter: Payer: Self-pay | Admitting: Internal Medicine

## 2016-09-21 ENCOUNTER — Ambulatory Visit: Payer: Medicare Other

## 2016-09-21 ENCOUNTER — Ambulatory Visit (INDEPENDENT_AMBULATORY_CARE_PROVIDER_SITE_OTHER): Payer: Medicare Other | Admitting: Internal Medicine

## 2016-09-21 VITALS — BP 140/80 | HR 67 | Ht 62.0 in | Wt 138.0 lb

## 2016-09-21 DIAGNOSIS — I350 Nonrheumatic aortic (valve) stenosis: Secondary | ICD-10-CM

## 2016-09-21 DIAGNOSIS — Z01810 Encounter for preprocedural cardiovascular examination: Secondary | ICD-10-CM

## 2016-09-21 DIAGNOSIS — I480 Paroxysmal atrial fibrillation: Secondary | ICD-10-CM

## 2016-09-21 DIAGNOSIS — I5032 Chronic diastolic (congestive) heart failure: Secondary | ICD-10-CM

## 2016-09-21 DIAGNOSIS — I4891 Unspecified atrial fibrillation: Secondary | ICD-10-CM

## 2016-09-21 DIAGNOSIS — Z5181 Encounter for therapeutic drug level monitoring: Secondary | ICD-10-CM

## 2016-09-21 LAB — POCT INR: INR: 3.2

## 2016-09-21 NOTE — Patient Instructions (Signed)
Your physician wants you to follow-up in: 6 months with Dr. Hilty. You will receive a reminder letter in the mail two months in advance. If you don't receive a letter, please call our office to schedule the follow-up appointment.    

## 2016-09-21 NOTE — Progress Notes (Signed)
OFFICE NOTE  Chief Complaint:  Preoperative clearance  Primary Care Physician: Mauricio Po, [name]  HPI:  [name] is a pleasant 76 year old female kindly referred to me be Dr. Ardeth Perfect. She reports a past medical history significant for atrial fibrillation and congestive heart failure, which occurred around the same time and probably 2012. She vaguely recalls seeing Dr. Irish Lack at that time.  She was started on warfarin therapy and no attempts were made to get her back into rhythm as she was paroxysmal. She does has a long-standing history of hypertension and dyslipidemia. She apparently developed heart failure or however has never had reassessment of her ejection fraction. She subsequently was having health issues and decided to move with her son to live in [place]. She says at that time she had some problems with chest pain and ultimately underwent a stress test. This was associated with an anaphylactic type reaction and despite their reassurances she will "no longer have any more stress test". She says she has a severe allergy to IV contrast dye which causes throat swelling as well as milk which also causes the same symptoms. Apparently she also underwent cardiac catheterization however she says that only medical therapy was recommended. This was in [place], [place].  We are trying to request records from there as well. She subsequently moved with her son to [facility], however he lost his job and she recently moved back to [place]. Currently she denies any significant shortness of breath but does report some mild leg edema which is slightly worse and occasional pain in her left chest and left arm over the last several days. She's also had pain in the right chest. None of those symptoms are worse with exertion or relieved by rest or medication.   [name] returns today for followup. She again appears upset and quite nervous. Blood pressure recently has been significantly elevated.  In fact she went to the emergency room and was hospitalized for blood pressure control. Although it seems that her blood pressure more easily was controlled once her oral medicines were restarted. Subsequently she's been started on clonidine is now up to twice a day. Blood pressure today was still high at 678 systolic. She reports some occasional chest discomfort, mostly at rest and reports not being active enough to know if she has symptoms with exertion. She also has pain all over body significant low back pain and is seeing a specialist about that in the next few weeks.  I saw Ms. Wenrick back in the office today. She reports some improvement in her chest discomfort and I think this is related to blood pressure. The recent switch in her blood pressure medicines indicate a marked improvement in her pressures in she is noted to be 130/66 today. She does get some slight shortness of breath with exertion and no regular chest discomfort. At this time there is not clear evidence to pursue a further workup although I have a low threshold for further cardiac workup if she should become more symptomatic. She is in sinus rhythm and seems to be maintaining that.  [name] returns today for follow-up. She's had a remarkable past 6 months. Unfortunately she was diagnosed with a gastric cancer which was considered very high risk to resect. She was referred to [facility] for an endoscopic resection which was successful with clean margins. Prior to that she underwent cardiac evaluation by [facility] cardiology including a nuclear stress test which was low risk and didn't involve  exercise. She reports that during the last minute of exercise she went into A. fib. She's had recurrent A. fib on and off and in fact was just hospitalized again for A. fib a few weeks ago which converted spontaneously back to sinus rhythm, prior to cardiology is evaluating her. She was then directed to follow-up with me. She has fortunately been on  warfarin with therapeutic INRs. Of note, I did review her cardiac catheterization which was performed at an outside hospital 2014 which did demonstrate an 80% mid RCA stenosis as well as a 70% OM lesion and some mild to moderate disease of other vessels. At the time no stent was recommended, which leads me to believe these may be smaller vessels. The fact that her most recent stress test was again negative suggest that they are not flow limiting even though they would seem significant. Also, today she is describing left hand pain. She says that there is exquisite tenderness the left hand and it's difficult for her to extend her finger. She often notes that her left middle finger gets stuck. On palpation I do not notice any significant arthritis of the joints or nodules on the tendons.  04/19/2016  [name] returns today for follow-up. She is concerned about shortness of breath which is been progressive. She reports recently she's had some loss of vocal power and noted some upper airway wheezing. We and related with her on the office today and her oxygen saturations were noted to be 97% at rest and that came down to 95% with ambulation. EKG shows sinus rhythm at 66. Blood pressure is mildly elevated today. She has a nonproductive cough but denies fever, chills or sweats. Weight is up to 141 from 130 pounds 6 months ago.  07/11/2015  [name] returns today for follow-up. She seems to be doing fairly well. She denies any worsening shortness of breath or chest pain. She seems to be suffering from some acid reflux and will likely be undergoing another EGD with Dr. Benson Norway. She continues to have problems with voice changes which could be due to vocal cord inflammation related to her reflux.  09/21/2016  [name] is here today for follow-up. She had an INR check in the office. She is scheduled to have upcoming surgery for large hiatal hernia. Warm to [facility] by Dr. Precious Bard. Risk factors for surgery  including aortic stenosis, however this is recently assessed and is moderate. She denies any chest pain or anginal symptoms.  PMHx:  Past Medical History:  Diagnosis Date  . Anemia 2005   Generally microcytic, transfusions in [address], 2012, 02/2015, 05/2015  . Aortic stenosis    Moderate November 2017  . Arthritis   . Broken back 2013   Chronic back pain.   [name] CAD (coronary artery disease)    Cardiac catheterization 2014 - 80% mid RCA and 70% OM managed medically  . CHF (congestive heart failure) ([place])   . Contrast media allergy   . Diastolic heart failure ([place])   . Esophageal cancer ([place]) 05/06/2015   Adenocarcinoma GE junction  . GERD (gastroesophageal reflux disease)   . H/O iron deficiency    05-06-15 iron infusion ([facility])  . HH (hiatus hernia) 2008   Large with associated erosions  . Hypertension   . Paroxysmal atrial fibrillation ([name])   . PONV (postoperative nausea and vomiting)   . Pulmonary emboli ([place]) 2008, 2012  . Stroke ([place])   . Transfusion history    2 units transfused  05-06-15 ([facility])    Past Surgical History:  Procedure Laterality Date  . APPENDECTOMY  1950s  . BACK SURGERY    . CARPAL TUNNEL RELEASE Bilateral 1990s  . [place] SURGERY  2014  . CHOLECYSTECTOMY  1980s   open  . COLONOSCOPY N/A 02/17/2015   Procedure: COLONOSCOPY;  Surgeon: [name], MD;  Location: [facility] ENDOSCOPY;  Service: Endoscopy;  Laterality: N/A;  . ESOPHAGOGASTRODUODENOSCOPY N/A 02/16/2015   Procedure: ESOPHAGOGASTRODUODENOSCOPY (EGD);  Surgeon: [name], MD;  Location: [facility];  Service: Endoscopy;  Laterality: N/A;  . ESOPHAGOGASTRODUODENOSCOPY N/A 05/06/2015   Procedure: ESOPHAGOGASTRODUODENOSCOPY (EGD);  Surgeon: [name], MD;  Location: [facility] ENDOSCOPY;  Service: Endoscopy;  Laterality: N/A;  . EUS N/A 05/20/2015   Procedure: UPPER ENDOSCOPIC ULTRASOUND (EUS) LINEAR;  Surgeon: [name], MD;  Location: [facility] ENDOSCOPY;  Service: Endoscopy;  Laterality: N/A;  .  exploratory lab  1950s or 1960s  . GIVENS CAPSULE STUDY N/A 05/06/2015   Procedure: GIVENS CAPSULE STUDY;  Surgeon: [name], MD;  Location: [facility] ENDOSCOPY;  Service: Endoscopy;  Laterality: N/A;  . lumbar back surgery  2012  . [place] SURGERY  1991  . TONSILLECTOMY    . TONSILLECTOMY AND ADENOIDECTOMY  1960s    FAMHx:  Family History  Problem Relation Age of Onset  . Stroke Mother   . Heart disease Mother   . Emphysema Father   . Ovarian cancer Sister   . Stroke Sister   . Other Child     died at birth    SOCHx:   reports that she has never smoked. She has never used smokeless tobacco. She reports that she does not drink alcohol or use drugs.  ALLERGIES:  Allergies  Allergen Reactions  . Iodinated Diagnostic Agents Anaphylaxis    IPD dye Info given by patient  . Ioxaglate Anaphylaxis    Info given by patient  . Milk-Related Compounds Anaphylaxis    Lactose intolerance  . Red Dye Anaphylaxis  . Whey     Lactose intolerance  . Darvon [Propoxyphene] Rash  . Hydralazine Anxiety    Facial flushing, pt prefers not to use it.     ROS: Pertinent items noted in HPI and remainder of comprehensive ROS otherwise negative.  HOME MEDS: Current Outpatient Prescriptions  Medication Sig Dispense Refill  . acetaminophen (TYLENOL) 500 MG tablet Take 500-1,000 mg by mouth daily as needed for mild pain or moderate pain.     [name] albuterol (PROVENTIL HFA;VENTOLIN HFA) 108 (90 Base) MCG/ACT inhaler Inhale 2 puffs into the lungs every 4 (four) hours as needed for wheezing or shortness of breath. 1 Inhaler 1  . budesonide-formoterol (SYMBICORT) 160-4.5 MCG/ACT inhaler Inhale 2 puffs into the lungs 2 (two) times daily. 1 Inhaler 0  . cyanocobalamin (,VITAMIN B-12,) 1000 MCG/ML injection IM daily 11/29-12/3, then weekly x4, then monthly. 9 mL 0  . diltiazem (CARDIZEM CD) 120 MG 24 hr capsule Take 1 capsule (120 mg total) by mouth daily. 30 capsule 5  . furosemide (LASIX) 40 MG tablet  Take 1 tablet (40 mg total) by mouth daily. 30 tablet 5  . gabapentin (NEURONTIN) 100 MG capsule TAKE 1 CAPSULE BY MOUTH THREE TIMES A DAY. 270 capsule 0  . HYDROcodone-acetaminophen (NORCO/VICODIN) 5-325 MG tablet Take 1 tablet by mouth every 6 (six) hours as needed for moderate pain.    . metoprolol (LOPRESSOR) 50 MG tablet TAKE 1 TABLET BY MOUTH TWICE DAILY. 180 tablet 0  . pantoprazole (PROTONIX)  40 MG tablet Take 1 tablet (40 mg total) by mouth 2 (two) times daily. 180 tablet 0  . Pediatric Multivitamins-Iron (FLINTSTONES PLUS IRON) chewable tablet Chew 2 tablets by mouth daily. 60 tablet 0  . simvastatin (ZOCOR) 20 MG tablet TAKE (1) TABLET BY MOUTH AT BEDTIME. 90 tablet 0  . temazepam (RESTORIL) 15 MG capsule TAKE 1 CAPSULE BY MOUTH AT BEDTIME. 30 capsule 0  . traMADol (ULTRAM) 50 MG tablet TAKE (1) TABLET BY MOUTH EVERY EIGHT HOURS. 90 tablet 0  . warfarin (COUMADIN) 5 MG tablet TAKE 1/2 TABLET BY MOUTH ON WEDNESDAY AND 1 TABLET ALL OTHER DAYS OR AS DIRECTED BY ANTICOAGULATION CLINIC. 90 tablet 1   No current facility-administered medications for this visit.     LABS/IMAGING: No results found for this or any previous visit (from the past 48 hour(s)). No results found.  VITALS: BP (!) 190/82   Pulse 67   Ht 5\' 2"  (1.575 m)   Wt 138 lb (62.6 kg)   BMI 25.24 kg/m   EXAM: General appearance: alert and no distress Neck: no carotid bruit, no JVD and thyroid not enlarged, symmetric, no tenderness/mass/nodules Lungs: clear to auscultation bilaterally Heart: regular rate and rhythm, S1, S2 normal and systolic murmur: midsystolic 3/6, crescendo at 2nd right intercostal space Abdomen: soft, non-tender; bowel sounds normal; no masses,  no organomegaly Extremities: varicose veins noted and no edema Pulses: 2+ and symmetric Skin: Skin color, texture, turgor normal. No rashes or lesions Neurologic: Grossly normal Psych: Pleasant  EKG: Sinus rhythm at 67  ASSESSMENT: 1. Low to  intermediate risk for upcoming hernia 2. Paroxysmal atrial fibrillation on warfarin 3. History of diastolic congestive heart failure - EF 60-65% on echo 4. Hypertension - controlled 5. Dyslipidemia 6. Moderate aortic stenosis - 05/2016 7. Right hand pain with contracture 8. Recent GI cancer/resected  PLAN: 1.   [name] is symptomatic from a large hiatal hernia. She had recent GI cancer which was resected and plans undergo hiatal hernia repair by Dr. Precious Bard at [facility]. Based on her recent echocardiogram she should be at low to intermediate risk for surgery based on moderate aortic stenosis. She is asymptomatic with this and denies any anginal symptoms. She does have PAF on warfarin and will need to hold her warfarin for 5 days prior to the procedure restart afterwards.  Follow-up in 6 months.  Pixie Casino, MD, [facility] Attending Cardiologist [place] [facility] [place] 09/21/2016, 3:29 PM

## 2016-09-22 ENCOUNTER — Ambulatory Visit: Payer: Self-pay | Admitting: General Practice

## 2016-09-22 ENCOUNTER — Ambulatory Visit: Payer: Medicare Other

## 2016-09-22 DIAGNOSIS — I4891 Unspecified atrial fibrillation: Secondary | ICD-10-CM

## 2016-09-22 DIAGNOSIS — Z5181 Encounter for therapeutic drug level monitoring: Secondary | ICD-10-CM

## 2016-09-25 DIAGNOSIS — K449 Diaphragmatic hernia without obstruction or gangrene: Secondary | ICD-10-CM | POA: Diagnosis not present

## 2016-09-25 DIAGNOSIS — K219 Gastro-esophageal reflux disease without esophagitis: Secondary | ICD-10-CM | POA: Diagnosis not present

## 2016-09-26 ENCOUNTER — Other Ambulatory Visit: Payer: Self-pay | Admitting: Family

## 2016-09-26 MED ORDER — FUROSEMIDE 40 MG PO TABS: 40.0000 mg | ORAL_TABLET | Freq: Every day | ORAL | 5 refills | Status: DC

## 2016-09-26 MED ORDER — TEMAZEPAM 15 MG PO CAPS: 15.0000 mg | ORAL_CAPSULE | Freq: Every day | ORAL | 0 refills | Status: DC

## 2016-09-26 NOTE — Telephone Encounter (Signed)
Pt called wanting to speak with you.  She said that she needs refills on temazepam (RESTORIL) 15 MG and furosemide (LASIX) 40 MG tablet.  She also said that she is itching from her legs to her stomach and wanted to know if there was anything Marya Amsler could prescribe her. Please advise.

## 2016-09-26 NOTE — Telephone Encounter (Signed)
Please advise 

## 2016-09-27 NOTE — Telephone Encounter (Signed)
Rx has been sent. LVM letting pt know as well as let her know that she will need to have an appointment to see another provider for itching due to Marya Amsler not being back in the office until next Friday.

## 2016-10-06 ENCOUNTER — Ambulatory Visit: Payer: Medicare Other

## 2016-10-10 ENCOUNTER — Encounter (HOSPITAL_COMMUNITY): Payer: Self-pay | Admitting: Cardiology

## 2016-10-10 ENCOUNTER — Emergency Department (HOSPITAL_COMMUNITY)
Admission: EM | Admit: 2016-10-10 | Discharge: 2016-10-10 | Disposition: A | Discharge status: Other Acute Inpatient Hospital | Payer: Medicare Other | Attending: Emergency Medicine | Admitting: Emergency Medicine

## 2016-10-10 ENCOUNTER — Emergency Department (HOSPITAL_COMMUNITY): Payer: Medicare Other

## 2016-10-10 DIAGNOSIS — G44009 Cluster headache syndrome, unspecified, not intractable: Secondary | ICD-10-CM | POA: Diagnosis not present

## 2016-10-10 DIAGNOSIS — K3189 Other diseases of stomach and duodenum: Secondary | ICD-10-CM | POA: Diagnosis not present

## 2016-10-10 DIAGNOSIS — C169 Malignant neoplasm of stomach, unspecified: Secondary | ICD-10-CM | POA: Diagnosis not present

## 2016-10-10 DIAGNOSIS — I160 Hypertensive urgency: Secondary | ICD-10-CM | POA: Diagnosis not present

## 2016-10-10 DIAGNOSIS — K219 Gastro-esophageal reflux disease without esophagitis: Secondary | ICD-10-CM | POA: Diagnosis not present

## 2016-10-10 DIAGNOSIS — K449 Diaphragmatic hernia without obstruction or gangrene: Secondary | ICD-10-CM | POA: Diagnosis not present

## 2016-10-10 DIAGNOSIS — I480 Paroxysmal atrial fibrillation: Secondary | ICD-10-CM | POA: Diagnosis not present

## 2016-10-10 DIAGNOSIS — Z981 Arthrodesis status: Secondary | ICD-10-CM | POA: Diagnosis not present

## 2016-10-10 DIAGNOSIS — I509 Heart failure, unspecified: Secondary | ICD-10-CM | POA: Insufficient documentation

## 2016-10-10 DIAGNOSIS — I110 Hypertensive heart disease with heart failure: Secondary | ICD-10-CM | POA: Insufficient documentation

## 2016-10-10 DIAGNOSIS — Z885 Allergy status to narcotic agent status: Secondary | ICD-10-CM | POA: Diagnosis not present

## 2016-10-10 DIAGNOSIS — R1013 Epigastric pain: Secondary | ICD-10-CM

## 2016-10-10 DIAGNOSIS — R109 Unspecified abdominal pain: Secondary | ICD-10-CM | POA: Diagnosis not present

## 2016-10-10 DIAGNOSIS — Z7901 Long term (current) use of anticoagulants: Secondary | ICD-10-CM | POA: Insufficient documentation

## 2016-10-10 DIAGNOSIS — M549 Dorsalgia, unspecified: Secondary | ICD-10-CM | POA: Diagnosis not present

## 2016-10-10 DIAGNOSIS — I2510 Atherosclerotic heart disease of native coronary artery without angina pectoris: Secondary | ICD-10-CM | POA: Insufficient documentation

## 2016-10-10 DIAGNOSIS — Z8673 Personal history of transient ischemic attack (TIA), and cerebral infarction without residual deficits: Secondary | ICD-10-CM | POA: Diagnosis not present

## 2016-10-10 DIAGNOSIS — I4891 Unspecified atrial fibrillation: Secondary | ICD-10-CM | POA: Diagnosis not present

## 2016-10-10 DIAGNOSIS — C155 Malignant neoplasm of lower third of esophagus: Secondary | ICD-10-CM | POA: Diagnosis not present

## 2016-10-10 DIAGNOSIS — Z79899 Other long term (current) drug therapy: Secondary | ICD-10-CM | POA: Insufficient documentation

## 2016-10-10 DIAGNOSIS — I350 Nonrheumatic aortic (valve) stenosis: Secondary | ICD-10-CM | POA: Diagnosis not present

## 2016-10-10 DIAGNOSIS — R0602 Shortness of breath: Secondary | ICD-10-CM | POA: Diagnosis not present

## 2016-10-10 DIAGNOSIS — Z7951 Long term (current) use of inhaled steroids: Secondary | ICD-10-CM | POA: Diagnosis not present

## 2016-10-10 DIAGNOSIS — R739 Hyperglycemia, unspecified: Secondary | ICD-10-CM | POA: Diagnosis not present

## 2016-10-10 DIAGNOSIS — G8929 Other chronic pain: Secondary | ICD-10-CM | POA: Diagnosis not present

## 2016-10-10 DIAGNOSIS — J449 Chronic obstructive pulmonary disease, unspecified: Secondary | ICD-10-CM | POA: Diagnosis not present

## 2016-10-10 DIAGNOSIS — J9811 Atelectasis: Secondary | ICD-10-CM | POA: Diagnosis not present

## 2016-10-10 DIAGNOSIS — R197 Diarrhea, unspecified: Secondary | ICD-10-CM | POA: Diagnosis not present

## 2016-10-10 DIAGNOSIS — I700 Atherosclerosis of aorta: Secondary | ICD-10-CM | POA: Diagnosis not present

## 2016-10-10 LAB — CBC WITH DIFFERENTIAL/PLATELET
Basophils Absolute: 0 10*3/uL (ref 0.0–0.1)
Basophils Relative: 0 %
Eosinophils Absolute: 0 10*3/uL (ref 0.0–0.7)
Eosinophils Relative: 0 %
HCT: 32.9 % — ABNORMAL LOW (ref 36.0–46.0)
Hemoglobin: 10.4 g/dL — ABNORMAL LOW (ref 12.0–15.0)
LYMPHS ABS: 1.4 10*3/uL (ref 0.7–4.0)
LYMPHS PCT: 15 %
MCH: 24.3 pg — AB (ref 26.0–34.0)
MCHC: 31.6 g/dL (ref 30.0–36.0)
MCV: 76.9 fL — ABNORMAL LOW (ref 78.0–100.0)
Monocytes Absolute: 0.9 10*3/uL (ref 0.1–1.0)
Monocytes Relative: 9 %
NEUTROS PCT: 76 %
Neutro Abs: 7 10*3/uL (ref 1.7–7.7)
PLATELETS: 195 10*3/uL (ref 150–400)
RBC: 4.28 MIL/uL (ref 3.87–5.11)
RDW: 14.4 % (ref 11.5–15.5)
WBC: 9.3 10*3/uL (ref 4.0–10.5)

## 2016-10-10 LAB — BASIC METABOLIC PANEL
Anion gap: 8 (ref 5–15)
BUN: 8 mg/dL (ref 6–20)
CALCIUM: 8.6 mg/dL — AB (ref 8.9–10.3)
CO2: 25 mmol/L (ref 22–32)
CREATININE: 0.74 mg/dL (ref 0.44–1.00)
Chloride: 102 mmol/L (ref 101–111)
GFR calc Af Amer: >60 mL/min (ref >60)
GLUCOSE: 161 mg/dL — AB (ref 65–99)
POTASSIUM: 4 mmol/L (ref 3.5–5.1)
SODIUM: 135 mmol/L (ref 135–145)

## 2016-10-10 LAB — HEPATIC FUNCTION PANEL
ALBUMIN: 3.7 g/dL (ref 3.5–5.0)
ALT: 12 U/L — ABNORMAL LOW (ref 14–54)
AST: 18 U/L (ref 15–41)
Alkaline Phosphatase: 88 U/L (ref 38–126)
BILIRUBIN DIRECT: 0.1 mg/dL (ref 0.1–0.5)
BILIRUBIN TOTAL: 0.6 mg/dL (ref 0.3–1.2)
Indirect Bilirubin: 0.5 mg/dL (ref 0.3–0.9)
Total Protein: 6.6 g/dL (ref 6.5–8.1)

## 2016-10-10 LAB — URINALYSIS, ROUTINE W REFLEX MICROSCOPIC
Bacteria, UA: NONE SEEN
Bilirubin Urine: NEGATIVE
GLUCOSE, UA: 50 mg/dL — AB
Ketones, ur: NEGATIVE mg/dL
Leukocytes, UA: NEGATIVE
Nitrite: NEGATIVE
PH: 8 (ref 5.0–8.0)
Protein, ur: 30 mg/dL — AB
Specific Gravity, Urine: 1.017 (ref 1.005–1.030)

## 2016-10-10 LAB — PROTIME-INR
INR: 3.04
Prothrombin Time: 32.1 seconds — ABNORMAL HIGH (ref 11.4–15.2)

## 2016-10-10 LAB — TROPONIN I
TROPONIN I: 0.03 ng/mL — AB (ref <0.03)
TROPONIN I: 0.03 ng/mL — AB (ref <0.03)

## 2016-10-10 LAB — BRAIN NATRIURETIC PEPTIDE: B NATRIURETIC PEPTIDE 5: 687 pg/mL — AB (ref 0.0–100.0)

## 2016-10-10 LAB — LIPASE, BLOOD: LIPASE: 14 U/L (ref 11–51)

## 2016-10-10 MED ORDER — MORPHINE SULFATE (PF) 4 MG/ML IV SOLN
4.0000 mg | Freq: Once | INTRAVENOUS | Status: AC
  Administered 2016-10-10: 4 mg via INTRAVENOUS
  Filled 2016-10-10: qty 1

## 2016-10-10 MED ORDER — ONDANSETRON HCL 4 MG/2ML IJ SOLN
4.0000 mg | Freq: Once | INTRAMUSCULAR | Status: AC
  Administered 2016-10-10: 4 mg via INTRAVENOUS
  Filled 2016-10-10: qty 2

## 2016-10-10 NOTE — ED Notes (Signed)
CRITICAL VALUE ALERT  Critical value received:  0.03 troponin  Date of notification:  10/10/16  Time of notification:  0347  Critical value read back:Yes.    Nurse who received alert:  c Audery Wassenaar rn Responding MD:  Dr. York Cerise  Time MD responded:  [phone]

## 2016-10-10 NOTE — ED Notes (Signed)
Per [facility] can go ahead and arrange transport. Report to [phone]. No changes. nad.

## 2016-10-10 NOTE — ED Triage Notes (Signed)
States her top number of her blood pressure was 213 this morning.  Also c/o abdominal pain and nausea since Friday. c/o sob since Sunday

## 2016-10-10 NOTE — ED Provider Notes (Signed)
[facility] DEPT Provider Note   CSN: [id number] Arrival date & time: 10/10/16  1110   By signing my name below, I, Eunice Blase, attest that this documentation has been prepared under the direction and in the presence of Forde Dandy, MD. Electronically signed, Eunice Blase, ED Scribe. 10/10/16. 2:20 PM .   History   Chief Complaint Chief Complaint  Patient presents with  . Shortness of Breath   The history is provided by the patient, a relative and medical records. No language interpreter was used.  Shortness of Breath  This is a recurrent problem. Associated symptoms include vomiting, abdominal pain and leg swelling. Pertinent negatives include no fever and no leg pain.  Abdominal Pain   This is a new problem. The problem occurs constantly. Associated symptoms include nausea, vomiting and arthralgias. Pertinent negatives include fever and diarrhea. The symptoms are aggravated by certain positions and palpation. Nothing relieves the symptoms. Past workup includes surgery.    [name] is a 75 y.o. female with h/o AFIB, GERD, cholecystectomy, appendectomy, back surgery, neck surgery and arthritis, transported by family to the Emergency Department with concern for high blood pressure readings measured at home this morning. Pt currently c/o nausea, intermittent bilateral lower back pain x 3 days that is worse with contact, episodic 8/10 upper/mid abdominal pain x 3 days not affected by prescribed tramadol, N/V, joint pain, SOB, leg swelling. She adds her abdominal pain is worse with eating and it comes on randomly. No h/o similar symptoms noted. Last normal bowel movement reportedly this morning. Pt with normal solid/fluid intake, stool/urine output. Pt denies fever, diarrhea, black and bloody stools. Contrast allergy noted. Posterior shortness of breath she has noted a little bit shortness of breath with activity, but this is normal for her, and states that that is not the reason  why she is here today.   Past Medical History:  Diagnosis Date  . Anemia 2005   Generally microcytic, transfusions in [address], 2012, 02/2015, 05/2015  . Aortic stenosis    Moderate November 2017  . Arthritis   . Broken back 2013   Chronic back pain.   [name] CAD (coronary artery disease)    Cardiac catheterization 2014 - 80% mid RCA and 70% OM managed medically  . CHF (congestive heart failure) ([place])   . Contrast media allergy   . Diastolic heart failure ([place])   . Esophageal cancer ([place]) 05/06/2015   Adenocarcinoma GE junction  . GERD (gastroesophageal reflux disease)   . H/O iron deficiency    05-06-15 iron infusion ([facility])  . HH (hiatus hernia) 2008   Large with associated erosions  . Hypertension   . Paroxysmal atrial fibrillation ([name])   . PONV (postoperative nausea and vomiting)   . Pulmonary emboli ([place]) 2008, 2012  . Stroke ([place])   . Transfusion history    2 units transfused 05-06-15 ([facility])    Patient Active Problem List   Diagnosis Date Noted  . Preoperative cardiovascular examination 09/21/2016  . Foot sprain, left, initial encounter 07/10/2016  . Hyperglycemia 06/26/2016  . Atrial fibrillation with RVR ([place]) 06/23/2016  . Acute bronchitis 06/22/2016  . Encounter for medication review and counseling 06/07/2016  . Anemia 05/28/2016  . Essential hypertension, benign 04/19/2016  . Aortic valve stenosis 09/28/2015  . Chest pain at rest 09/05/2015  . 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 [place] lesion, acute   . IDA (iron deficiency anemia)   . Occult GI bleeding   . Nausea vomiting and  diarrhea 05/05/2015  . Symptomatic anemia 05/05/2015  . HLD (hyperlipidemia) 05/05/2015  . Abdominal pain 05/05/2015  . GERD (gastroesophageal reflux disease) 05/05/2015  . Absolute anemia   . Anticoagulated on Coumadin   . Osteoarthritis 04/16/2015  . History of pulmonary embolism 03/02/2015  . Shortness of breath 02/22/2015  . Benign neoplasm of descending colon 02/17/2015  .  Diverticulosis of large intestine without diverticulitis 02/17/2015  . Esophageal stricture 02/16/2015  . UGI bleed 02/13/2015  . Supratherapeutic INR 02/13/2015  . Encounter for therapeutic drug monitoring 10/15/2014  . Thoracic back pain 10/15/2014  . Macular degeneration 09/01/2014  . Pneumonia 08/27/2014  . Benign paroxysmal positional vertigo   . Hypertension 08/24/2014  . Back pain 08/03/2014  . Complaints of total body pain 08/03/2014  . Sleep disturbance 08/03/2014  . Aortic stenosis 03/05/2014  . PAF (paroxysmal atrial fibrillation) ([place]) 01/21/2014  . Long term current use of anticoagulant therapy 01/21/2014  . Chronic diastolic congestive heart failure ([place]) 01/21/2014  . Chest pain 01/21/2014    Past Surgical History:  Procedure Laterality Date  . APPENDECTOMY  1950s  . BACK SURGERY    . CARPAL TUNNEL RELEASE Bilateral 1990s  . [place] SURGERY  2014  . CHOLECYSTECTOMY  1980s   open  . COLONOSCOPY N/A 02/17/2015   Procedure: COLONOSCOPY;  Surgeon: [name], MD;  Location: [facility] ENDOSCOPY;  Service: Endoscopy;  Laterality: N/A;  . ESOPHAGOGASTRODUODENOSCOPY N/A 02/16/2015   Procedure: ESOPHAGOGASTRODUODENOSCOPY (EGD);  Surgeon: [name], MD;  Location: [facility];  Service: Endoscopy;  Laterality: N/A;  . ESOPHAGOGASTRODUODENOSCOPY N/A 05/06/2015   Procedure: ESOPHAGOGASTRODUODENOSCOPY (EGD);  Surgeon: [name], MD;  Location: [facility] ENDOSCOPY;  Service: Endoscopy;  Laterality: N/A;  . EUS N/A 05/20/2015   Procedure: UPPER ENDOSCOPIC ULTRASOUND (EUS) LINEAR;  Surgeon: [name], MD;  Location: [facility] ENDOSCOPY;  Service: Endoscopy;  Laterality: N/A;  . exploratory lab  1950s or 1960s  . GIVENS CAPSULE STUDY N/A 05/06/2015   Procedure: GIVENS CAPSULE STUDY;  Surgeon: [name], MD;  Location: [facility] ENDOSCOPY;  Service: Endoscopy;  Laterality: N/A;  . lumbar back surgery  2012  . [place] SURGERY  1991  . TONSILLECTOMY    . TONSILLECTOMY AND  ADENOIDECTOMY  1960s    OB History    No data available       Home Medications    Prior to Admission medications   Medication Sig Start Date End Date Taking? Authorizing Provider  acetaminophen (TYLENOL) 500 MG tablet Take 500-1,000 mg by mouth daily as needed for mild pain or moderate pain.    Yes Historical Provider, MD  albuterol (PROVENTIL HFA;VENTOLIN HFA) 108 (90 Base) MCG/ACT inhaler Inhale 2 puffs into the lungs every 4 (four) hours as needed for wheezing or shortness of breath. 06/18/16  Yes Lajean Saver, MD  budesonide-formoterol [facility]) 160-4.5 MCG/ACT inhaler Inhale 2 puffs into the lungs 2 (two) times daily. 06/22/16  Yes Golden Circle, [name]  diltiazem (CARDIZEM CD) 120 MG 24 hr capsule Take 1 capsule (120 mg total) by mouth daily. 07/10/16  Yes Pixie Casino, MD  furosemide (LASIX) 40 MG tablet Take 1 tablet (40 mg total) by mouth daily. 09/26/16  Yes Golden Circle, [name]  gabapentin (NEURONTIN) 100 MG capsule TAKE 1 CAPSULE BY MOUTH THREE TIMES A DAY. 07/12/16  Yes Golden Circle, [name]  HYDROcodone-acetaminophen (NORCO/VICODIN) 5-325 MG tablet Take 1 tablet by mouth every 6 (six) hours as needed for moderate pain.   Yes  Historical Provider, MD  metoprolol (LOPRESSOR) 50 MG tablet TAKE 1 TABLET BY MOUTH TWICE DAILY. 06/07/16  Yes Golden Circle, [name]  pantoprazole (PROTONIX) 40 MG tablet Take 1 tablet (40 mg total) by mouth 2 (two) times daily. 07/12/16  Yes Golden Circle, [name]  Pediatric Multivitamins-Iron (FLINTSTONES PLUS IRON) chewable tablet Chew 2 tablets by mouth daily. 05/30/16  Yes Samuella Cota, MD  simvastatin (ZOCOR) 20 MG tablet TAKE (1) TABLET BY MOUTH AT BEDTIME. 07/12/16  Yes Golden Circle, [name]  temazepam (RESTORIL) 15 MG capsule Take 1 capsule (15 mg total) by mouth at bedtime. 09/26/16  Yes Golden Circle, [name]  traMADol (ULTRAM) 50 MG tablet TAKE (1) TABLET BY MOUTH EVERY EIGHT HOURS. 07/12/16  Yes Golden Circle, [name]  warfarin (COUMADIN) 5 MG  tablet TAKE 1/2 TABLET BY MOUTH ON WEDNESDAY AND 1 TABLET ALL OTHER DAYS OR AS DIRECTED BY ANTICOAGULATION CLINIC. 08/11/16  Yes Golden Circle, [name]  cyanocobalamin (,VITAMIN B-12,) 1000 MCG/ML injection IM daily 11/29-12/3, then weekly x4, then monthly. Patient not taking: Reported on 10/10/2016 05/30/16   Samuella Cota, MD    Family History Family History  Problem Relation Age of Onset  . Stroke Mother   . Heart disease Mother   . Emphysema Father   . Ovarian cancer Sister   . Stroke Sister   . Other Child     died at birth    Social History Social History  Substance Use Topics  . Smoking status: Never Smoker  . Smokeless tobacco: Never Used  . Alcohol use No     Allergies   Iodinated diagnostic agents; Ioxaglate; Milk-related compounds; Red dye; Whey; Darvon [propoxyphene]; and Hydralazine   Review of Systems Review of Systems  Constitutional: Negative for fever.  Respiratory: Positive for shortness of breath.   Cardiovascular: Positive for leg swelling.  Gastrointestinal: Positive for abdominal pain, nausea and vomiting. Negative for blood in stool and diarrhea.  Musculoskeletal: Positive for arthralgias and back pain.  All other systems reviewed and are negative.    Physical Exam Updated Vital Signs BP (!) 213/72   Pulse 72   Temp 98.8 F (37.1 C) (Oral)   Resp (!) 27   Ht 5\' 2"  (1.575 m)   Wt 136 lb (61.7 kg)   SpO2 95%   BMI 24.87 kg/m   Physical Exam Physical Exam  Nursing note and vitals reviewed. Constitutional: Well developed, well nourished, non-toxic, and in no acute distress Head: Normocephalic and atraumatic.  Mouth/Throat: Oropharynx is clear and dry mucus membdranes.  Neck: Normal range of motion. Neck supple.  Cardiovascular: Normal rate and regular rhythm.   Pulmonary/Chest: Effort normal and breath sounds normal.  Abdominal: Soft. There is generalized tenderness, worst in the epigastrium. There is no rebound and no guarding.    Musculoskeletal: Normal range of motion. No lower extremity edema. Exquisitely TTP over bilateral flank. Neurological: Alert, no facial droop, fluent speech, moves all extremities symmetrically Skin: Skin is warm and dry.  Psychiatric: Cooperative   ED Treatments / Results  DIAGNOSTIC STUDIES: Oxygen Saturation is 95% on RA, normal by my interpretation.    COORDINATION OF CARE: 12:18 PM Discussed treatment plan with pt at bedside and pt agreed to plan. Will order imaging, medication and labs, then reassess.  Labs (all labs ordered are listed, but only abnormal results are displayed) Labs Reviewed  CBC WITH DIFFERENTIAL/PLATELET - Abnormal; Notable for the following:       Result Value  Hemoglobin 10.4 (*)    HCT 32.9 (*)    MCV 76.9 (*)    MCH 24.3 (*)    All other components within normal limits  BASIC METABOLIC PANEL - Abnormal; Notable for the following:    Glucose, Bld 161 (*)    Calcium 8.6 (*)    All other components within normal limits  URINALYSIS, ROUTINE W REFLEX MICROSCOPIC - Abnormal; Notable for the following:    Glucose, UA 50 (*)    Hgb urine dipstick SMALL (*)    Protein, ur 30 (*)    Squamous Epithelial / LPF 0-5 (*)    All other components within normal limits  PROTIME-INR - Abnormal; Notable for the following:    Prothrombin Time 32.1 (*)    All other components within normal limits  TROPONIN I - Abnormal; Notable for the following:    Troponin I 0.03 (*)    All other components within normal limits  BRAIN NATRIURETIC PEPTIDE - Abnormal; Notable for the following:    B Natriuretic Peptide 687.0 (*)    All other components within normal limits  HEPATIC FUNCTION PANEL - Abnormal; Notable for the following:    ALT 12 (*)    All other components within normal limits  LIPASE, BLOOD    EKG  EKG Interpretation  Date/Time:  Tuesday October 10 2016 11:26:20 EDT Ventricular Rate:  75 PR Interval:    QRS Duration: 79 QT Interval:  402 QTC  Calculation: 449 R Axis:   70 Text Interpretation:  Sinus rhythm no acute changes  Confirmed by Sheri Gatchel MD, Irish Breisch [phone]) on 10/10/2016 12:16:58 PM       Radiology Ct Abdomen Pelvis Wo Contrast  Result Date: 10/10/2016 CLINICAL DATA:  Mid abdominal pain for 5 days with nausea. Contrast allergy. EXAM: CT ABDOMEN AND PELVIS WITHOUT CONTRAST TECHNIQUE: Multidetector CT imaging of the abdomen and pelvis was performed following the standard protocol without IV contrast. COMPARISON:  05/31/2015, 05/05/2015 FINDINGS: Lower chest: Normal heart size. No pericardial effusion. Native coronary atherosclerosis evident. Mitral valve annular calcifications present. Lower thoracic aorta is atherosclerotic. Small pleural effusions noted layering dependently, larger on the right. There is associated bibasilar compressive atelectasis. Large hiatal hernia noted. Nonspecific masslike wall thickening of the stomach, images 3 through 16, series 2. The patient does have a history of GE junction carcinoma. Hepatobiliary: No focal hepatic abnormality within the limits of noncontrast imaging. No intrahepatic biliary dilatation. Remote cholecystectomy noted. Stable common bile duct dilatation, suspect postcholecystectomy related. Pancreas: Unremarkable. No pancreatic ductal dilatation or surrounding inflammatory changes. Spleen: Spleen is normal in size. Stable peripherally calcified 1.7 cm benign splenic lesion anteriorly. Adrenals/Urinary Tract: Adrenal glands are unremarkable. Kidneys are normal, without renal calculi, focal lesion, or hydronephrosis. Bladder is unremarkable. Stomach/Bowel: Large hiatal hernia and masslike gastric wall thickening, nonspecific. Negative for bowel obstruction, significant dilatation, ileus, or free air. Appendix not visualized. Scattered colonic diverticulosis. No acute inflammatory process. No fluid collection or abscess. Vascular/Lymphatic: Abdominal atherosclerosis noted of the visceral vasculature.  Aortic atherosclerosis present without aneurysm. No retroperitoneal abnormality or hemorrhage. No adenopathy. Reproductive: Uterus is atrophic. No significant adnexal abnormality. No pelvic free fluid. Other: No abdominal wall hernia or abnormality. No abdominopelvic ascites. Musculoskeletal: Diffuse degenerative spondylosis of the visualized spine and. Lower lumbar posterior fusion noted at L4-5 between the spinous processes. No acute compression fracture. Mild associated scoliosis. IMPRESSION: Small pleural effusions with bibasilar compressive atelectasis, slightly larger on the right. Large hiatal hernia with masslike thickening of the stomach wall,  nonspecific however the patient does have a history of the GE junction carcinoma. Aortic atherosclerosis.  Native coronary atherosclerosis. Remote cholecystectomy with stable common bile duct dilatation. No acute process in the abdomen or pelvis by noncontrast imaging. Electronically Signed   By: Jerilynn Mages.  Shick M.D.   On: 10/10/2016 15:29   Dg Chest Portable 1 View  Result Date: 10/10/2016 CLINICAL DATA:  Shortness of breath. EXAM: PORTABLE CHEST 1 VIEW COMPARISON:  Radiograph June 23, 2016. FINDINGS: Stable cardiomediastinal silhouette. Atherosclerosis of thoracic aorta is noted. Mild central pulmonary vascular congestion is noted. No pneumothorax is noted. Mild bibasilar densities are noted concerning for edema or atelectasis. Bony thorax is unremarkable. IMPRESSION: Aortic atherosclerosis. Mild bibasilar edema or atelectasis. Mild central pulmonary vascular congestion. Electronically Signed   By: Marijo Conception, M.D.   On: 10/10/2016 11:56    Procedures Procedures (including critical care time)  Medications Ordered in ED Medications  morphine 4 MG/ML injection 4 mg (4 mg Intravenous Given 10/10/16 1241)  ondansetron (ZOFRAN) injection 4 mg (4 mg Intravenous Given 10/10/16 1241)  morphine 4 MG/ML injection 4 mg (4 mg Intravenous Given 10/10/16 1440)      Initial Impression / Assessment and Plan / ED Course  I have reviewed the triage vital signs and the nursing notes.  Pertinent labs & imaging results that were available during my care of the patient were reviewed by me and considered in my medical decision making (see chart for details).      Presents with post-prandial abdominal pain for several days. She is nontoxic in no acute distress and has a soft abdomen but with diffuse tenderness, worse in the epigastrium. Her shortness breath , she states is not what she is here for today. She does not look grossly fluid overloaded. Chest x-ray without significant edema or evidence of pneumonia. She does have mildly elevated BNP in the 600 and a troponin of 0.03. She is without any chest pain or ischemic changes on her EKG.   I reviewed the available records in [facility] and also Care Everywhere, updated the chart. Patient seen by Dr. Stephanie Acre from GI, and also has history of gastric cardia intramucosal adenocarcinoma status post EGD with excision in 07/12/2016. A repeat EGD in March 2018, there was no recurrence of this mass.  CT abdomen pelvis are visualized today, and shows masslike thickening in the stomach, and I question if she may have recurrence of her mass. Dr. Oneida Alar initially consult from GI, but recommended that she likely needs endoscopic ultrasound which is not available here and recommended that I call gastroenterology from [facility].  Spoke with Dr. Jacqulyn Ducking from hospitalist service there. They recommend admission for observation at [facility] so they can consult GI regarding management.    Final Clinical Impressions(s) / ED Diagnoses   Final diagnoses:  Epigastric abdominal pain  Gastric wall thickening    New Prescriptions New Prescriptions   No medications on file   I personally performed the services described in this documentation, which was scribed in my presence. The recorded information has been reviewed and is accurate.     Forde Dandy, MD 10/10/16 [phone]

## 2016-10-10 NOTE — ED Notes (Signed)
Pt taken to ct 

## 2016-10-11 DIAGNOSIS — N2889 Other specified disorders of kidney and ureter: Secondary | ICD-10-CM | POA: Diagnosis not present

## 2016-10-11 DIAGNOSIS — C169 Malignant neoplasm of stomach, unspecified: Secondary | ICD-10-CM | POA: Diagnosis not present

## 2016-10-11 DIAGNOSIS — K7689 Other specified diseases of liver: Secondary | ICD-10-CM | POA: Diagnosis not present

## 2016-10-11 DIAGNOSIS — J449 Chronic obstructive pulmonary disease, unspecified: Secondary | ICD-10-CM | POA: Diagnosis not present

## 2016-10-11 DIAGNOSIS — I350 Nonrheumatic aortic (valve) stenosis: Secondary | ICD-10-CM | POA: Diagnosis not present

## 2016-10-11 DIAGNOSIS — K838 Other specified diseases of biliary tract: Secondary | ICD-10-CM | POA: Diagnosis not present

## 2016-10-11 DIAGNOSIS — R935 Abnormal findings on diagnostic imaging of other abdominal regions, including retroperitoneum: Secondary | ICD-10-CM | POA: Diagnosis not present

## 2016-10-11 DIAGNOSIS — R1013 Epigastric pain: Secondary | ICD-10-CM | POA: Diagnosis not present

## 2016-10-11 DIAGNOSIS — R109 Unspecified abdominal pain: Secondary | ICD-10-CM | POA: Diagnosis not present

## 2016-10-11 DIAGNOSIS — K449 Diaphragmatic hernia without obstruction or gangrene: Secondary | ICD-10-CM | POA: Diagnosis not present

## 2016-10-11 DIAGNOSIS — Z8673 Personal history of transient ischemic attack (TIA), and cerebral infarction without residual deficits: Secondary | ICD-10-CM | POA: Diagnosis not present

## 2016-10-11 DIAGNOSIS — J9811 Atelectasis: Secondary | ICD-10-CM | POA: Diagnosis not present

## 2016-10-11 DIAGNOSIS — C155 Malignant neoplasm of lower third of esophagus: Secondary | ICD-10-CM | POA: Diagnosis not present

## 2016-10-13 ENCOUNTER — Telehealth: Payer: Self-pay | Admitting: Family

## 2016-10-13 NOTE — Telephone Encounter (Signed)
LVM advising pt call back. If BP is still elevated pt does need to be seen.

## 2016-10-13 NOTE — Telephone Encounter (Signed)
Patient would like to talk to [facility]. She states her bp has been running high. Today her bp was 225/73. She only wants to see [place]. I informed her I could get her in to see someone else at a different office. Please follow up with patient.

## 2016-10-16 ENCOUNTER — Ambulatory Visit (INDEPENDENT_AMBULATORY_CARE_PROVIDER_SITE_OTHER): Payer: Medicare Other | Admitting: Family

## 2016-10-16 ENCOUNTER — Encounter: Payer: Self-pay | Admitting: Family

## 2016-10-16 ENCOUNTER — Other Ambulatory Visit (INDEPENDENT_AMBULATORY_CARE_PROVIDER_SITE_OTHER): Payer: Medicare Other

## 2016-10-16 ENCOUNTER — Telehealth: Payer: Self-pay | Admitting: *Deleted

## 2016-10-16 ENCOUNTER — Telehealth: Payer: Self-pay

## 2016-10-16 VITALS — BP 208/76 | HR 80 | Temp 98.3°F | Resp 18 | Ht 62.0 in | Wt 135.0 lb

## 2016-10-16 DIAGNOSIS — E538 Deficiency of other specified B group vitamins: Secondary | ICD-10-CM

## 2016-10-16 DIAGNOSIS — I10 Essential (primary) hypertension: Secondary | ICD-10-CM

## 2016-10-16 DIAGNOSIS — I5032 Chronic diastolic (congestive) heart failure: Secondary | ICD-10-CM

## 2016-10-16 DIAGNOSIS — D509 Iron deficiency anemia, unspecified: Secondary | ICD-10-CM

## 2016-10-16 DIAGNOSIS — C160 Malignant neoplasm of cardia: Secondary | ICD-10-CM

## 2016-10-16 LAB — IBC PANEL
Iron: 8 ug/dL — ABNORMAL LOW (ref 42–145)
SATURATION RATIOS: 2.1 % — AB (ref 20.0–50.0)
TRANSFERRIN: 268 mg/dL (ref 212.0–360.0)

## 2016-10-16 LAB — CBC
HCT: 30.5 % — ABNORMAL LOW (ref 36.0–46.0)
Hemoglobin: 9.8 g/dL — ABNORMAL LOW (ref 12.0–15.0)
MCHC: 32.2 g/dL (ref 30.0–36.0)
MCV: 75.3 fl — ABNORMAL LOW (ref 78.0–100.0)
PLATELETS: 201 10*3/uL (ref 150.0–400.0)
RBC: 4.05 Mil/uL (ref 3.87–5.11)
RDW: 16.8 % — ABNORMAL HIGH (ref 11.5–15.5)
WBC: 4.8 10*3/uL (ref 4.0–10.5)

## 2016-10-16 LAB — BASIC METABOLIC PANEL
BUN: 9 mg/dL (ref 6–23)
CHLORIDE: 107 meq/L (ref 96–112)
CO2: 26 meq/L (ref 19–32)
Calcium: 8.7 mg/dL (ref 8.4–10.5)
Creatinine, Ser: 0.88 mg/dL (ref 0.40–1.20)
GFR: 66.2 mL/min (ref >60.00)
Glucose, Bld: 174 mg/dL — ABNORMAL HIGH (ref 70–99)
POTASSIUM: 3.8 meq/L (ref 3.5–5.1)
SODIUM: 141 meq/L (ref 135–145)

## 2016-10-16 LAB — BRAIN NATRIURETIC PEPTIDE: Pro B Natriuretic peptide (BNP): 390 pg/mL — ABNORMAL HIGH (ref 0.0–100.0)

## 2016-10-16 LAB — FERRITIN: FERRITIN: 15.8 ng/mL (ref 10.0–291.0)

## 2016-10-16 MED ORDER — CYANOCOBALAMIN 1000 MCG/ML IJ SOLN
1000.0000 ug | Freq: Once | INTRAMUSCULAR | Status: AC
  Administered 2016-10-16: 1000 ug via INTRAMUSCULAR

## 2016-10-16 NOTE — Assessment & Plan Note (Signed)
Maintained on B12 injections monthly. B12 injection given today without complication. Continue to monitor.

## 2016-10-16 NOTE — Telephone Encounter (Signed)
Pt saw PCP today and her labs are in EPIC. Her iron is 8. Her PCP told her to call Dr Benay Spice today. Her bp was 208/76 and her PCP adjusted her meds, he also increased her lasix saying she has fluid on her lungs. She has no energy.

## 2016-10-16 NOTE — Assessment & Plan Note (Signed)
Pressure elevated above goal 150/90 with current medication regimen and no adverse side effects or symptoms of end organ damage. Does have shortness of breath questionable origin related to blood pressure versus heart failure possible iron deficiency. Increase metoprolol. Continue current dosage of furosemide and diltiazem. Cardiac exam with normal sinus rhythm appreciated. Continue to monitor blood pressures at home and follow sodium diet.

## 2016-10-16 NOTE — Assessment & Plan Note (Signed)
Most recent 2-D echocardiogram showed grade 2 diastolic dysfunction with normal systolic ejection fraction. Question cardiac versus other metabolic origin. Obtain BNP. Consider additional furosemide pending blood work results to help with blood pressure and fluid if needed. Continue to monitor.

## 2016-10-16 NOTE — Progress Notes (Signed)
Subjective:    Patient ID: [name], female    DOB: 10-22-1940, 76 y.o.   MRN: [id number]  Chief Complaint  Patient presents with  . Hypertension    states that for over a week her bp has been running really high with the top number over 200s, having SOB, and feeling weak again, was told she has some fluid at the bottom of both of her lungs    HPI:  [name] is a 76 y.o. female who  has a past medical history of Anemia (2005); Aortic stenosis; Arthritis; Broken back (2013); CAD (coronary artery disease); CHF (congestive heart failure) ([place]); Contrast media allergy; Diastolic heart failure ([place]); Esophageal cancer ([place]) (05/06/2015); GERD (gastroesophageal reflux disease); H/O iron deficiency; HH (hiatus hernia) (2008); Hypertension; Paroxysmal atrial fibrillation ([place]); PONV (postoperative nausea and vomiting); Pulmonary emboli ([place]) (2008, 2012); Stroke [facility]); and Transfusion history. and presents today for a follow up office visit.  Hypertension - currently maintained on diltiazem, furosemide, and metoprolol. Reports taking the medication as prescribed and denies adverse side effects. Blood pressures have been significantly elevated in the 200's with increased levels of shortness of breath and feeling weak again. CT completed on 10/10/16 showed small pleural effusions with bibasilar compressive atelectasis. Denies worst headache of life. Does have some lower extremity edema at time.  BP Readings from Last 3 Encounters:  10/16/16 (!) 208/76  10/10/16 (!) 175/54  09/21/16 140/80      Allergies  Allergen Reactions  . Iodinated Diagnostic Agents Anaphylaxis    IPD dye Info given by patient  . Ioxaglate Anaphylaxis    Info given by patient  . Milk-Related Compounds Anaphylaxis    Lactose intolerance  . Red Dye Anaphylaxis  . Whey     Lactose intolerance  . Darvon [Propoxyphene] Rash  . Hydralazine Anxiety    Facial flushing, pt prefers not to use it.        Outpatient Medications Prior to Visit  Medication Sig Dispense Refill  . acetaminophen (TYLENOL) 500 MG tablet Take 500-1,000 mg by mouth daily as needed for mild pain or moderate pain.     [name] albuterol (PROVENTIL HFA;VENTOLIN HFA) 108 (90 Base) MCG/ACT inhaler Inhale 2 puffs into the lungs every 4 (four) hours as needed for wheezing or shortness of breath. 1 Inhaler 1  . budesonide-formoterol (SYMBICORT) 160-4.5 MCG/ACT inhaler Inhale 2 puffs into the lungs 2 (two) times daily. 1 Inhaler 0  . cyanocobalamin (,VITAMIN B-12,) 1000 MCG/ML injection IM daily 11/29-12/3, then weekly x4, then monthly. 9 mL 0  . diltiazem (CARDIZEM CD) 120 MG 24 hr capsule Take 1 capsule (120 mg total) by mouth daily. 30 capsule 5  . furosemide (LASIX) 40 MG tablet Take 1 tablet (40 mg total) by mouth daily. 30 tablet 5  . gabapentin (NEURONTIN) 100 MG capsule TAKE 1 CAPSULE BY MOUTH THREE TIMES A DAY. 270 capsule 0  . HYDROcodone-acetaminophen (NORCO/VICODIN) 5-325 MG tablet Take 1 tablet by mouth every 6 (six) hours as needed for moderate pain.    . metoprolol (LOPRESSOR) 50 MG tablet TAKE 1 TABLET BY MOUTH TWICE DAILY. 180 tablet 0  . pantoprazole (PROTONIX) 40 MG tablet Take 1 tablet (40 mg total) by mouth 2 (two) times daily. 180 tablet 0  . Pediatric Multivitamins-Iron (FLINTSTONES PLUS IRON) chewable tablet Chew 2 tablets by mouth daily. 60 tablet 0  . simvastatin (ZOCOR) 20 MG tablet TAKE (1) TABLET BY MOUTH AT BEDTIME. 90 tablet 0  . temazepam (RESTORIL) 15  MG capsule Take 1 capsule (15 mg total) by mouth at bedtime. 30 capsule 0  . traMADol (ULTRAM) 50 MG tablet TAKE (1) TABLET BY MOUTH EVERY EIGHT HOURS. 90 tablet 0  . warfarin (COUMADIN) 5 MG tablet TAKE 1/2 TABLET BY MOUTH ON WEDNESDAY AND 1 TABLET ALL OTHER DAYS OR AS DIRECTED BY ANTICOAGULATION CLINIC. 90 tablet 1   No facility-administered medications prior to visit.       Review of Systems  Constitutional: Positive for fatigue. Negative  for chills and fever.  Eyes: Negative for visual disturbance.  Respiratory: Positive for shortness of breath. Negative for cough and chest tightness.   Cardiovascular: Positive for leg swelling. Negative for chest pain and palpitations.  Neurological: Positive for weakness. Negative for dizziness and headaches.      Objective:    BP (!) 208/76 (BP Location: Left Arm, Patient Position: Sitting, Cuff Size: Normal)   Pulse 80   Temp 98.3 F (36.8 C) (Oral)   Resp 18   Ht 5\' 2"  (1.575 m)   Wt 135 lb (61.2 kg)   SpO2 97%   BMI 24.69 kg/m  Nursing note and vital signs reviewed.  Physical Exam  Constitutional: She is oriented to person, place, and time. She appears well-developed and well-nourished. No distress.  Cardiovascular: Normal rate, regular rhythm, normal heart sounds and intact distal pulses.   Pulmonary/Chest: Effort normal and breath sounds normal.  Neurological: She is alert and oriented to person, place, and time.  Skin: Skin is warm and dry.  Psychiatric: She has a normal mood and affect. Her behavior is normal. Judgment and thought content normal.       Assessment & Plan:   Problem List Items Addressed This Visit      Cardiovascular and Mediastinum   Chronic diastolic congestive heart failure ([place])    Most recent 2-D echocardiogram showed grade 2 diastolic dysfunction with normal systolic ejection fraction. Question cardiac versus other metabolic origin. Obtain BNP. Consider additional furosemide pending blood work results to help with blood pressure and fluid if needed. Continue to monitor.      Relevant Orders   B Nat Peptide   Hypertension - Primary    Pressure elevated above goal 150/90 with current medication regimen and no adverse side effects or symptoms of end organ damage. Does have shortness of breath questionable origin related to blood pressure versus heart failure possible iron deficiency. Increase metoprolol. Continue current dosage of furosemide and  diltiazem. Cardiac exam with normal sinus rhythm appreciated. Continue to monitor blood pressures at home and follow sodium diet.      Relevant Orders   Basic Metabolic Panel (BMET)   CBC     Other   IDA (iron deficiency anemia)    Symptoms concerning for iron deficiency anemia associated with chronic blood loss related to Coumadin usage. Obtain CBC, iron panel and ferritin. Continue to monitor pending blood work results. May need follow-up with oncology/hematology for iron infusions.      Relevant Medications   cyanocobalamin ((VITAMIN B-12)) injection 1,000 mcg (Completed)   Other Relevant Orders   IBC panel   Ferritin   B12 deficiency    Maintained on B12 injections monthly. B12 injection given today without complication. Continue to monitor.       Relevant Medications   cyanocobalamin ((VITAMIN B-12)) injection 1,000 mcg (Completed)       I am having Ms. Hooper maintain her acetaminophen, cyanocobalamin, FLINTSTONES PLUS IRON, metoprolol, albuterol, budesonide-formoterol, diltiazem, traMADol, gabapentin, pantoprazole, simvastatin, HYDROcodone-acetaminophen,  warfarin, temazepam, and furosemide. We administered cyanocobalamin.   Meds ordered this encounter  Medications  . cyanocobalamin ((VITAMIN B-12)) injection 1,000 mcg     Follow-up: Return if symptoms worsen or fail to improve.  Mauricio Po, [name]

## 2016-10-16 NOTE — Telephone Encounter (Signed)
Call placed to patient to see if she has been taking Ferrous Sulfate.  Patient states that she has not been taking Ferrous Sulfate, but has been taking Flintstone vitamin with iron BID.  Dr. Benay Spice notified.  Call placed back to patient to inform her per order of Dr. Benay Spice to start Ferrous Sulfate 325 mg TID and that we will schedule lab and office visit next Tuesday with L. Marcello Moores NP.  Teach back done.  Patient appreciative of call.

## 2016-10-16 NOTE — Assessment & Plan Note (Signed)
Symptoms concerning for iron deficiency anemia associated with chronic blood loss related to Coumadin usage. Obtain CBC, iron panel and ferritin. Continue to monitor pending blood work results. May need follow-up with oncology/hematology for iron infusions.

## 2016-10-16 NOTE — Patient Instructions (Addendum)
Thank you for choosing [facility].  SUMMARY AND INSTRUCTIONS:  Increase metoprolol to 75 mg daily (Take 1.5 tablets twice daily).  We are going to check your hemoglobin and iron.  Please continue to monitor your blood pressure at home.   If your blood work looks okay we will add 40 mg of furosemide for 3 days.   Please continue to take your medications as prescribed.   Let us know if you know if your blood pressures do no improve.   Medication:  Your prescription(s) have been submitted to your pharmacy or been printed and provided for you. Please take as directed and contact our office if you believe you are having problem(s) with the medication(s) or have any questions.  Labs:  Please stop by the lab on the lower level of the building for your blood work. Your results will be released to [facility] (or called to you) after review, usually within 72 hours after test completion. If any changes need to be made, you will be notified at that same time.  1.) The lab is open from 7:30am to 5:30 pm Monday-Friday 2.) No appointment is necessary 3.) Fasting (if needed) is 6-8 hours after food and drink; black coffee and water are okay   Follow up:  If your symptoms worsen or fail to improve, please contact our office for further instruction, or in case of emergency go directly to the emergency room at the closest medical facility.

## 2016-10-17 ENCOUNTER — Telehealth: Payer: Self-pay | Admitting: Oncology

## 2016-10-17 NOTE — Telephone Encounter (Signed)
Spoke with patient re lab/fu 4/24.

## 2016-10-24 ENCOUNTER — Ambulatory Visit: Payer: Medicare Other | Admitting: General Practice

## 2016-10-24 ENCOUNTER — Ambulatory Visit (HOSPITAL_BASED_OUTPATIENT_CLINIC_OR_DEPARTMENT_OTHER): Payer: Medicare Other | Admitting: Nurse Practitioner

## 2016-10-24 ENCOUNTER — Other Ambulatory Visit (HOSPITAL_BASED_OUTPATIENT_CLINIC_OR_DEPARTMENT_OTHER): Payer: Medicare Other

## 2016-10-24 ENCOUNTER — Other Ambulatory Visit: Payer: Self-pay | Admitting: General Practice

## 2016-10-24 ENCOUNTER — Telehealth: Payer: Self-pay | Admitting: Family

## 2016-10-24 VITALS — BP 210/72 | HR 61

## 2016-10-24 VITALS — BP 182/72 | HR 64 | Temp 98.6°F | Resp 16 | Ht 62.0 in | Wt 138.5 lb

## 2016-10-24 DIAGNOSIS — I10 Essential (primary) hypertension: Secondary | ICD-10-CM | POA: Diagnosis not present

## 2016-10-24 DIAGNOSIS — D509 Iron deficiency anemia, unspecified: Secondary | ICD-10-CM | POA: Diagnosis not present

## 2016-10-24 DIAGNOSIS — C160 Malignant neoplasm of cardia: Secondary | ICD-10-CM

## 2016-10-24 DIAGNOSIS — Z136 Encounter for screening for cardiovascular disorders: Secondary | ICD-10-CM

## 2016-10-24 LAB — CBC WITH DIFFERENTIAL/PLATELET
BASO%: 0.7 % (ref 0.0–2.0)
Basophils Absolute: 0 10*3/uL (ref 0.0–0.1)
EOS ABS: 0.2 10*3/uL (ref 0.0–0.5)
EOS%: 3 % (ref 0.0–7.0)
HCT: 33.1 % — ABNORMAL LOW (ref 34.8–46.6)
HGB: 10.6 g/dL — ABNORMAL LOW (ref 11.6–15.9)
LYMPH%: 25.5 % (ref 14.0–49.7)
MCH: 24.9 pg — AB (ref 25.1–34.0)
MCHC: 32.1 g/dL (ref 31.5–36.0)
MCV: 77.5 fL — AB (ref 79.5–101.0)
MONO#: 0.5 10*3/uL (ref 0.1–0.9)
MONO%: 8.2 % (ref 0.0–14.0)
NEUT#: 3.7 10*3/uL (ref 1.5–6.5)
NEUT%: 62.6 % (ref 38.4–76.8)
Platelets: 216 10*3/uL (ref 145–400)
RBC: 4.27 10*6/uL (ref 3.70–5.45)
RDW: 20.3 % — ABNORMAL HIGH (ref 11.2–14.5)
WBC: 6 10*3/uL (ref 3.9–10.3)
lymph#: 1.5 10*3/uL (ref 0.9–3.3)

## 2016-10-24 LAB — IRON AND TIBC
%SAT: 80 % — ABNORMAL HIGH (ref 21–57)
Iron: 233 ug/dL — ABNORMAL HIGH (ref 41–142)
TIBC: 290 ug/dL (ref 236–444)
UIBC: 58 ug/dL — ABNORMAL LOW (ref 120–384)

## 2016-10-24 LAB — FERRITIN: FERRITIN: 42 ng/mL (ref 9–269)

## 2016-10-24 NOTE — Progress Notes (Signed)
Notified of elevated patient blood pressure readings with no symptoms of end organ damage. Increase diltiazem to 240 mg daily. Continue to monitor blood pressure at home and follow low sodium diet.

## 2016-10-24 NOTE — Progress Notes (Addendum)
  [facility] OFFICE PROGRESS NOTE   Diagnosis:  Gastroesophageal carcinoma  INTERVAL HISTORY:   [name] returns as scheduled. She was seen in the emergency department 10/10/2016 with shortness of breath, abdominal and back pain. Hemoglobin returned at 10.4. CT abdomen/pelvis showed nonspecific masslike wall thickening of the stomach. She was transferred to [facility] for further evaluation. GI medicine felt the patient's CT findings were most likely consistent was scarring from the previous procedure. Labs on 10/16/2016 showed a hemoglobin of 9.8, MCV 75.3, ferritin 15. She began ferrous sulfate 325 mg 3 times a day.  She reports feeling very "tired". She has dyspnea on exertion. She has occasional chest pain. No known bleeding. No significant constipation since beginning oral iron.  Objective:  Vital signs in last 24 hours:  Blood pressure (!) 182/72, pulse 64, temperature 98.6 F (37 C), temperature source Oral, resp. rate 16, height 5\' 2"  (1.575 m), weight 138 lb 8 oz (62.8 kg), SpO2 98 %.    Resp: Lungs clear bilaterally. Cardio: Regular rate and rhythm. GI: Abdomen with mild diffuse tenderness. No organomegaly. No mass. Vascular: No leg edema.   Lab Results:  Lab Results  Component Value Date   WBC 6.0 10/24/2016   HGB 10.6 (L) 10/24/2016   HCT 33.1 (L) 10/24/2016   MCV 77.5 (L) 10/24/2016   PLT 216 10/24/2016   NEUTROABS 3.7 10/24/2016    Imaging:  No results found.  Medications: I have reviewed the patient's current medications.  Assessment/Plan: 1. 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 Large hiatal hernia. Scar in the cardia. Normal examined duodenum.  CT scans abdomen/pelvis 10/10/2016-nonspecific masslike wall thickening  of the stomach. Evaluated by GI medicine at [facility]. CT findings felt to most likely be related to scarring from previous procedure.  2. History of recurrent anemia requiring red cell transfusions, last transfusion November 2017  3. Paroxysmal atrial fibrillation  4. History of pulmonary embolism  5. Gastroesophageal reflux disease  6. Aortic stenosis  7. Hypertension  8. History of a CVA   Disposition:[name] is a history of recurrent anemia. The anemia may be related to chronic GI blood loss related to the hiatal hernia or Coumadin anticoagulation. Recent labs showed progression of the anemia. She began ferrous sulfate last week. Hemoglobin today is slightly improved. We recommended she continue ferrous sulfate as she is currently taking. She will return for a follow-up visit and CBC in 2 weeks. She will contact the office in the interim with any problems.  Patient seen with Dr. Benay Spice. 25 minutes were spent face-to-face at today's visit with the majority of that time involved in counseling/coordination of care.    Ned Card ANP/GNP-BC   10/24/2016  1:54 PM This was a shared visit with Ned Card. [name] has persistent microcytic anemia. She most likely has iron deficiency anemia secondary to chronic GI blood loss while maintained on Coumadin. She will continue ferrous sulfate and return for a CBC in 2 weeks.  We will consider IV iron and transfusion support if the hemoglobin falls while on oral iron.  Julieanne Manson, M.D.

## 2016-10-24 NOTE — Telephone Encounter (Signed)
Pt saw [name] on 10/18/2016 for elevated BP. She said that he changed her medication but her BP has been over 200 ever since. She did not know what she needed to do. Pt is coming in today to have her BP checked.

## 2016-10-25 ENCOUNTER — Other Ambulatory Visit: Payer: Self-pay | Admitting: Family

## 2016-10-25 NOTE — Telephone Encounter (Signed)
Faxed script back to [facility]...[name]

## 2016-10-25 NOTE — Telephone Encounter (Signed)
Faxed script back to [facility]...[name]

## 2016-11-01 ENCOUNTER — Ambulatory Visit (INDEPENDENT_AMBULATORY_CARE_PROVIDER_SITE_OTHER): Payer: Medicare Other | Admitting: General Practice

## 2016-11-01 ENCOUNTER — Telehealth: Payer: Self-pay | Admitting: Internal Medicine

## 2016-11-01 DIAGNOSIS — Z5181 Encounter for therapeutic drug level monitoring: Secondary | ICD-10-CM

## 2016-11-01 DIAGNOSIS — I4891 Unspecified atrial fibrillation: Secondary | ICD-10-CM | POA: Diagnosis not present

## 2016-11-01 LAB — POCT INR: INR: 3.5

## 2016-11-01 NOTE — Patient Instructions (Signed)
Pre visit review using our clinic review tool, if applicable. No additional management support is needed unless otherwise documented below in the visit note. 

## 2016-11-01 NOTE — Telephone Encounter (Signed)
Spoke with pt, she reports her bp running 213-207-201 and 210 at home for the last 2 weeks. She went to have her INR checked today and her bp was 230/80. She was given clonidine and after 30 min her bp went down to 204/70-188/60 and 156/60. They have given her a prescription for a clonidine patch to start. At present she reports being off balance, headache and blurred vision, these symptoms have been present for 2 weeks. She called the office today because she is still in [place] and thought she could be seen because she lives in [place]. Aware dr Debara Pickett is not in the office now, she reports she will keep the appointment with dr hilty tomorrow.

## 2016-11-01 NOTE — Telephone Encounter (Signed)
New message    Pt c/o BP issue: STAT if pt c/o blurred vision, one-sided weakness or slurred speech  1. What are your last 5 BP readings? 156/60 now, at doctors 230/80 220/80  2. Are you having any other symptoms (ex. Dizziness, headache, blurred vision, passed out)? Headache, blurred vision and nausea  3. What is your BP issue? Pt blood pressure is high

## 2016-11-01 NOTE — Progress Notes (Signed)
I have reviewed and agree with the plan. 

## 2016-11-02 ENCOUNTER — Encounter (HOSPITAL_COMMUNITY): Payer: Self-pay

## 2016-11-02 ENCOUNTER — Ambulatory Visit
Admission: RE | Admit: 2016-11-02 | Discharge: 2016-11-02 | Disposition: A | Discharge status: Home / Self Care | Payer: Medicare Other | Source: Ambulatory Visit | Attending: Internal Medicine | Admitting: Internal Medicine

## 2016-11-02 ENCOUNTER — Other Ambulatory Visit: Payer: Self-pay

## 2016-11-02 ENCOUNTER — Emergency Department (HOSPITAL_COMMUNITY): Payer: Medicare Other

## 2016-11-02 ENCOUNTER — Ambulatory Visit (INDEPENDENT_AMBULATORY_CARE_PROVIDER_SITE_OTHER): Payer: Medicare Other | Admitting: Internal Medicine

## 2016-11-02 ENCOUNTER — Emergency Department (HOSPITAL_COMMUNITY)
Admission: EM | Admit: 2016-11-02 | Discharge: 2016-11-02 | Disposition: A | Discharge status: Home / Self Care | Payer: Medicare Other | Attending: Emergency Medicine | Admitting: Emergency Medicine

## 2016-11-02 ENCOUNTER — Other Ambulatory Visit: Payer: Self-pay | Admitting: Family

## 2016-11-02 ENCOUNTER — Telehealth: Payer: Self-pay | Admitting: Family

## 2016-11-02 ENCOUNTER — Ambulatory Visit
Admission: RE | Admit: 2016-11-02 | Discharge: 2016-11-02 | Disposition: A | Discharge status: Home / Self Care | Payer: Medicare Other | Source: Ambulatory Visit | Attending: Family | Admitting: Family

## 2016-11-02 ENCOUNTER — Telehealth: Payer: Self-pay | Admitting: Internal Medicine

## 2016-11-02 VITALS — BP 170/68 | HR 62 | Ht 62.0 in | Wt 133.0 lb

## 2016-11-02 DIAGNOSIS — D689 Coagulation defect, unspecified: Secondary | ICD-10-CM | POA: Diagnosis not present

## 2016-11-02 DIAGNOSIS — I10 Essential (primary) hypertension: Secondary | ICD-10-CM | POA: Diagnosis not present

## 2016-11-02 DIAGNOSIS — S52514A Nondisplaced fracture of right radial styloid process, initial encounter for closed fracture: Secondary | ICD-10-CM | POA: Insufficient documentation

## 2016-11-02 DIAGNOSIS — M79641 Pain in right hand: Secondary | ICD-10-CM | POA: Diagnosis not present

## 2016-11-02 DIAGNOSIS — Z8501 Personal history of malignant neoplasm of esophagus: Secondary | ICD-10-CM | POA: Insufficient documentation

## 2016-11-02 DIAGNOSIS — M25561 Pain in right knee: Secondary | ICD-10-CM | POA: Diagnosis not present

## 2016-11-02 DIAGNOSIS — Y999 Unspecified external cause status: Secondary | ICD-10-CM | POA: Diagnosis not present

## 2016-11-02 DIAGNOSIS — W01198A Fall on same level from slipping, tripping and stumbling with subsequent striking against other object, initial encounter: Secondary | ICD-10-CM | POA: Diagnosis not present

## 2016-11-02 DIAGNOSIS — R51 Headache: Secondary | ICD-10-CM | POA: Diagnosis not present

## 2016-11-02 DIAGNOSIS — Z85038 Personal history of other malignant neoplasm of large intestine: Secondary | ICD-10-CM | POA: Diagnosis not present

## 2016-11-02 DIAGNOSIS — S0083XA Contusion of other part of head, initial encounter: Secondary | ICD-10-CM | POA: Insufficient documentation

## 2016-11-02 DIAGNOSIS — R0602 Shortness of breath: Secondary | ICD-10-CM

## 2016-11-02 DIAGNOSIS — I2510 Atherosclerotic heart disease of native coronary artery without angina pectoris: Secondary | ICD-10-CM | POA: Insufficient documentation

## 2016-11-02 DIAGNOSIS — R5383 Other fatigue: Secondary | ICD-10-CM

## 2016-11-02 DIAGNOSIS — Z7901 Long term (current) use of anticoagulants: Secondary | ICD-10-CM | POA: Insufficient documentation

## 2016-11-02 DIAGNOSIS — Z01818 Encounter for other preprocedural examination: Secondary | ICD-10-CM

## 2016-11-02 DIAGNOSIS — I5032 Chronic diastolic (congestive) heart failure: Secondary | ICD-10-CM | POA: Insufficient documentation

## 2016-11-02 DIAGNOSIS — R079 Chest pain, unspecified: Secondary | ICD-10-CM

## 2016-11-02 DIAGNOSIS — I350 Nonrheumatic aortic (valve) stenosis: Secondary | ICD-10-CM | POA: Diagnosis not present

## 2016-11-02 DIAGNOSIS — Z8673 Personal history of transient ischemic attack (TIA), and cerebral infarction without residual deficits: Secondary | ICD-10-CM | POA: Diagnosis not present

## 2016-11-02 DIAGNOSIS — Z8679 Personal history of other diseases of the circulatory system: Secondary | ICD-10-CM | POA: Diagnosis not present

## 2016-11-02 DIAGNOSIS — S8992XA Unspecified injury of left lower leg, initial encounter: Secondary | ICD-10-CM | POA: Diagnosis not present

## 2016-11-02 DIAGNOSIS — S80212A Abrasion, left knee, initial encounter: Secondary | ICD-10-CM | POA: Diagnosis not present

## 2016-11-02 DIAGNOSIS — W19XXXA Unspecified fall, initial encounter: Secondary | ICD-10-CM

## 2016-11-02 DIAGNOSIS — I110 Hypertensive heart disease with heart failure: Secondary | ICD-10-CM | POA: Diagnosis not present

## 2016-11-02 DIAGNOSIS — S80211A Abrasion, right knee, initial encounter: Secondary | ICD-10-CM | POA: Diagnosis not present

## 2016-11-02 DIAGNOSIS — S6991XA Unspecified injury of right wrist, hand and finger(s), initial encounter: Secondary | ICD-10-CM | POA: Diagnosis not present

## 2016-11-02 DIAGNOSIS — N289 Disorder of kidney and ureter, unspecified: Secondary | ICD-10-CM | POA: Insufficient documentation

## 2016-11-02 DIAGNOSIS — Y939 Activity, unspecified: Secondary | ICD-10-CM | POA: Insufficient documentation

## 2016-11-02 DIAGNOSIS — Y92481 Parking lot as the place of occurrence of the external cause: Secondary | ICD-10-CM | POA: Diagnosis not present

## 2016-11-02 DIAGNOSIS — Z7982 Long term (current) use of aspirin: Secondary | ICD-10-CM | POA: Insufficient documentation

## 2016-11-02 DIAGNOSIS — M25562 Pain in left knee: Secondary | ICD-10-CM | POA: Diagnosis not present

## 2016-11-02 DIAGNOSIS — S098XXA Other specified injuries of head, initial encounter: Secondary | ICD-10-CM | POA: Diagnosis not present

## 2016-11-02 DIAGNOSIS — I200 Unstable angina: Secondary | ICD-10-CM | POA: Diagnosis not present

## 2016-11-02 DIAGNOSIS — S8991XA Unspecified injury of right lower leg, initial encounter: Secondary | ICD-10-CM | POA: Diagnosis not present

## 2016-11-02 LAB — CBC
HEMATOCRIT: 39.6 % (ref 35.0–45.0)
HEMOGLOBIN: 12.5 g/dL (ref 11.7–15.5)
MCH: 25.7 pg — AB (ref 27.0–33.0)
MCHC: 31.6 g/dL — AB (ref 32.0–36.0)
MCV: 81.3 fL (ref 80.0–100.0)
MPV: 9.9 fL (ref 7.5–12.5)
Platelets: 182 10*3/uL (ref 140–400)
RBC: 4.87 MIL/uL (ref 3.80–5.10)
RDW: 20.3 % — AB (ref 11.0–15.0)
WBC: 6.6 10*3/uL (ref 3.8–10.8)

## 2016-11-02 LAB — CBC WITH DIFFERENTIAL/PLATELET
BASOS ABS: 0 10*3/uL (ref 0.0–0.1)
BASOS PCT: 0 %
EOS ABS: 0.1 10*3/uL (ref 0.0–0.7)
Eosinophils Relative: 1 %
HEMATOCRIT: 39.9 % (ref 36.0–46.0)
HEMOGLOBIN: 12.4 g/dL (ref 12.0–15.0)
Lymphocytes Relative: 26 %
Lymphs Abs: 2 10*3/uL (ref 0.7–4.0)
MCH: 25.4 pg — ABNORMAL LOW (ref 26.0–34.0)
MCHC: 31.1 g/dL (ref 30.0–36.0)
MCV: 81.6 fL (ref 78.0–100.0)
MONO ABS: 0.6 10*3/uL (ref 0.1–1.0)
Monocytes Relative: 8 %
NEUTROS ABS: 4.9 10*3/uL (ref 1.7–7.7)
NEUTROS PCT: 65 %
Platelets: 164 10*3/uL (ref 150–400)
RBC: 4.89 MIL/uL (ref 3.87–5.11)
RDW: 19.1 % — AB (ref 11.5–15.5)
WBC: 7.5 10*3/uL (ref 4.0–10.5)

## 2016-11-02 LAB — I-STAT CHEM 8, ED
BUN: 16 mg/dL (ref 6–20)
CREATININE: 1.5 mg/dL — AB (ref 0.44–1.00)
Calcium, Ion: 1.02 mmol/L — ABNORMAL LOW (ref 1.15–1.40)
Chloride: 103 mmol/L (ref 101–111)
Glucose, Bld: 122 mg/dL — ABNORMAL HIGH (ref 65–99)
HEMATOCRIT: 38 % (ref 36.0–46.0)
HEMOGLOBIN: 12.9 g/dL (ref 12.0–15.0)
POTASSIUM: 4 mmol/L (ref 3.5–5.1)
SODIUM: 141 mmol/L (ref 135–145)
TCO2: 29 mmol/L (ref 0–100)

## 2016-11-02 LAB — PROTIME-INR
INR: 3
Prothrombin Time: 31.8 seconds — ABNORMAL HIGH (ref 11.4–15.2)

## 2016-11-02 LAB — BASIC METABOLIC PANEL
BUN: 11 mg/dL (ref 7–25)
CALCIUM: 8.9 mg/dL (ref 8.6–10.4)
CO2: 30 mmol/L (ref 20–31)
Chloride: 101 mmol/L (ref 98–110)
Creat: 1.02 mg/dL — ABNORMAL HIGH (ref 0.60–0.93)
GLUCOSE: 130 mg/dL — AB (ref 65–99)
Potassium: 4.4 mmol/L (ref 3.5–5.3)
SODIUM: 142 mmol/L (ref 135–146)

## 2016-11-02 LAB — TSH: TSH: 1.46 m[IU]/L

## 2016-11-02 MED ORDER — MORPHINE SULFATE (PF) 4 MG/ML IV SOLN
4.0000 mg | Freq: Once | INTRAVENOUS | Status: AC
  Administered 2016-11-02: 4 mg via INTRAVENOUS
  Filled 2016-11-02: qty 1

## 2016-11-02 MED ORDER — ONDANSETRON 4 MG PO TBDP
4.0000 mg | ORAL_TABLET | Freq: Once | ORAL | Status: AC
  Administered 2016-11-02: 4 mg via ORAL
  Filled 2016-11-02: qty 1

## 2016-11-02 MED ORDER — FAMOTIDINE 20 MG PO TABS: ORAL_TABLET | ORAL | 0 refills | Status: DC

## 2016-11-02 MED ORDER — ASPIRIN EC 81 MG PO TBEC: 81.0000 mg | DELAYED_RELEASE_TABLET | Freq: Every day | ORAL | 3 refills | Status: DC

## 2016-11-02 MED ORDER — AMLODIPINE BESYLATE 10 MG PO TABS: 10.0000 mg | ORAL_TABLET | Freq: Every day | ORAL | 5 refills | Status: DC

## 2016-11-02 MED ORDER — DIPHENHYDRAMINE HCL 25 MG PO CAPS: ORAL_CAPSULE | ORAL | 0 refills | Status: DC

## 2016-11-02 MED ORDER — PREDNISONE 20 MG PO TABS: ORAL_TABLET | ORAL | 0 refills | Status: DC

## 2016-11-02 MED ORDER — CARVEDILOL 12.5 MG PO TABS: 12.5000 mg | ORAL_TABLET | Freq: Two times a day (BID) | ORAL | 3 refills | Status: DC

## 2016-11-02 MED ORDER — CLONIDINE HCL 0.1 MG/24HR TD PTWK: 0.1000 mg | MEDICATED_PATCH | TRANSDERMAL | 3 refills | Status: DC

## 2016-11-02 NOTE — ED Notes (Signed)
Pt. ambulated slowly / short distance with standby assist - Dr. Winfred Leeds seen pt. while ambulating , discharge plan explained.

## 2016-11-02 NOTE — ED Provider Notes (Addendum)
[facility] DEPT Provider Note   CSN: [id number] Arrival date & time: 11/02/16  1556     History   Chief Complaint Chief Complaint  Patient presents with  . Fall    HPI [name] is a 76 y.o. female.Patient tripped and fell in a parking lot of [employer] over  concrete barrier today 11:15 PM she fell striking her forehead she also complains of right hand and wrist pain and bilateral knee pain since the event. No treatment prior to coming here no neck pain no loss of consciousness she felt well prior to the event. Nothing makes pain better or worse. No treatment prior to coming here.  HPI  Past Medical History:  Diagnosis Date  . Anemia 2005   Generally microcytic, transfusions in [address], 2012, 02/2015, 05/2015  . Aortic stenosis    Moderate November 2017  . Arthritis   . Broken back 2013   Chronic back pain.   [name] CAD (coronary artery disease)    Cardiac catheterization 2014 - 80% mid RCA and 70% OM managed medically  . CHF (congestive heart failure) ([place])   . Contrast media allergy   . Diastolic heart failure ([place])   . Esophageal cancer ([place]) 05/06/2015   Adenocarcinoma GE junction  . GERD (gastroesophageal reflux disease)   . H/O iron deficiency    05-06-15 iron infusion ([facility])  . HH (hiatus hernia) 2008   Large with associated erosions  . Hypertension   . Paroxysmal atrial fibrillation ([name])   . PONV (postoperative nausea and vomiting)   . Pulmonary emboli ([place]) 2008, 2012  . Stroke ([place])   . Transfusion history    2 units transfused 05-06-15 ([facility])  Atrial fibrillation  Patient Active Problem List   Diagnosis Date Noted  . Unstable angina ([place]) 11/02/2016  . Other fatigue 11/02/2016  . B12 deficiency 10/16/2016  . Preoperative cardiovascular examination 09/21/2016  . Foot sprain, left, initial encounter 07/10/2016  . Hyperglycemia 06/26/2016  . Atrial fibrillation with RVR ([place]) 06/23/2016  . Acute bronchitis 06/22/2016  . Encounter for  medication review and counseling 06/07/2016  . Anemia 05/28/2016  . Essential hypertension, benign 04/19/2016  . Aortic valve stenosis 09/28/2015  . Chest pain at rest 09/05/2015  . 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 [place] lesion, acute   . IDA (iron deficiency anemia)   . Occult GI bleeding   . Nausea vomiting and diarrhea 05/05/2015  . Symptomatic anemia 05/05/2015  . HLD (hyperlipidemia) 05/05/2015  . Abdominal pain 05/05/2015  . GERD (gastroesophageal reflux disease) 05/05/2015  . Absolute anemia   . Anticoagulated on Coumadin   . Osteoarthritis 04/16/2015  . History of pulmonary embolism 03/02/2015  . Shortness of breath 02/22/2015  . Benign neoplasm of descending colon 02/17/2015  . Diverticulosis of large intestine without diverticulitis 02/17/2015  . Esophageal stricture 02/16/2015  . UGI bleed 02/13/2015  . Supratherapeutic INR 02/13/2015  . Encounter for therapeutic drug monitoring 10/15/2014  . Thoracic back pain 10/15/2014  . Macular degeneration 09/01/2014  . Pneumonia 08/27/2014  . Benign paroxysmal positional vertigo   . Uncontrolled hypertension 08/24/2014  . Back pain 08/03/2014  . Complaints of total body pain 08/03/2014  . Sleep disturbance 08/03/2014  . Aortic stenosis 03/05/2014  . PAF (paroxysmal atrial fibrillation) ([place]) 01/21/2014  . Long term current use of anticoagulant therapy 01/21/2014  . Chronic diastolic congestive heart failure ([place]) 01/21/2014  . Chest pain 01/21/2014    Past Surgical History:  Procedure Laterality Date  .  APPENDECTOMY  1950s  . BACK SURGERY    . CARPAL TUNNEL RELEASE Bilateral 1990s  . [place] SURGERY  2014  . CHOLECYSTECTOMY  1980s   open  . COLONOSCOPY N/A 02/17/2015   Procedure: COLONOSCOPY;  Surgeon: [name], MD;  Location: [facility] ENDOSCOPY;  Service: Endoscopy;  Laterality: N/A;  . ESOPHAGOGASTRODUODENOSCOPY N/A 02/16/2015   Procedure: ESOPHAGOGASTRODUODENOSCOPY (EGD);  Surgeon: [name], MD;  Location: [facility];  Service: Endoscopy;  Laterality: N/A;  . ESOPHAGOGASTRODUODENOSCOPY N/A 05/06/2015   Procedure: ESOPHAGOGASTRODUODENOSCOPY (EGD);  Surgeon: [name], MD;  Location: [facility] ENDOSCOPY;  Service: Endoscopy;  Laterality: N/A;  . EUS N/A 05/20/2015   Procedure: UPPER ENDOSCOPIC ULTRASOUND (EUS) LINEAR;  Surgeon: [name], MD;  Location: [facility] ENDOSCOPY;  Service: Endoscopy;  Laterality: N/A;  . exploratory lab  1950s or 1960s  . GIVENS CAPSULE STUDY N/A 05/06/2015   Procedure: GIVENS CAPSULE STUDY;  Surgeon: [name], MD;  Location: [facility] ENDOSCOPY;  Service: Endoscopy;  Laterality: N/A;  . lumbar back surgery  2012  . [place] SURGERY  1991  . TONSILLECTOMY    . TONSILLECTOMY AND ADENOIDECTOMY  1960s    OB History    No data available       Home Medications    Prior to Admission medications   Medication Sig Start Date End Date Taking? Authorizing Provider  acetaminophen (TYLENOL) 500 MG tablet Take 500-1,000 mg by mouth daily as needed for mild pain or moderate pain.     Historical Provider, MD  albuterol (PROVENTIL HFA;VENTOLIN HFA) 108 (90 Base) MCG/ACT inhaler Inhale 2 puffs into the lungs every 4 (four) hours as needed for wheezing or shortness of breath. 06/18/16   Lajean Saver, MD  amLODipine (NORVASC) 10 MG tablet Take 1 tablet (10 mg total) by mouth daily. 11/02/16   Pixie Casino, MD  aspirin EC 81 MG tablet Take 1 tablet (81 mg total) by mouth daily. 11/02/16   Pixie Casino, MD  budesonide-formoterol [facility]) 160-4.5 MCG/ACT inhaler Inhale 2 puffs into the lungs 2 (two) times daily. 06/22/16   Golden Circle, [name]  carvedilol (COREG) 12.5 MG tablet Take 1 tablet (12.5 mg total) by mouth 2 (two) times daily. 11/02/16 01/31/17  Pixie Casino, MD  cloNIDine (CATAPRES - DOSED IN MG/24 HR) 0.1 mg/24hr patch Place 1 patch (0.1 mg total) onto the skin once a week. 11/02/16   Gold[place] Circle, [name]  cyanocobalamin (,VITAMIN B-12,) 1000 MCG/ML  injection IM daily 11/29-12/3, then weekly x4, then monthly. 05/30/16   Samuella Cota, MD  diphenhydrAMINE (BENADRYL) 25 mg capsule Take 1 capsule by mouth the morning of your procedure 11/02/16   Pixie Casino, MD  famotidine (PEPCID) 20 MG tablet Take 1 tablet by mouth the morning of your procedure 11/02/16   Pixie Casino, MD  ferrous sulfate 325 (65 FE) MG tablet Take 325 mg by mouth daily with breakfast.    Historical Provider, MD  furosemide (LASIX) 40 MG tablet Take 1 tablet (40 mg total) by mouth daily. 09/26/16   Golden Circle, [name]  gabapentin (NEURONTIN) 100 MG capsule TAKE 1 CAPSULE BY MOUTH THREE TIMES A DAY. 07/12/16   Golden Circle, [name]  HYDROcodone-acetaminophen (NORCO/VICODIN) 5-325 MG tablet Take 1 tablet by mouth every 6 (six) hours as needed for moderate pain.    Historical Provider, MD  pantoprazole (PROTONIX) 40 MG tablet Take 1 tablet (40 mg total) by mouth 2 (two) times daily.  07/12/16   Golden Circle, [name]  Pediatric Multivitamins-Iron (FLINTSTONES PLUS IRON) chewable tablet Chew 2 tablets by mouth daily. 05/30/16   Samuella Cota, MD  predniSONE (DELTASONE) 20 MG tablet Take 3 tablets (60mg ) by mouth the evening before and morning of your procedure 11/02/16   Pixie Casino, MD  simvastatin (ZOCOR) 20 MG tablet TAKE (1) TABLET BY MOUTH AT BEDTIME. 07/12/16   Golden Circle, [name]  temazepam (RESTORIL) 15 MG capsule TAKE 1 CAPSULE BY MOUTH AT BEDTIME. 10/25/16   Golden Circle, [name]  traMADol (ULTRAM) 50 MG tablet TAKE (1) TABLET BY MOUTH EVERY EIGHT HOURS. 07/12/16   Golden Circle, [name]  warfarin (COUMADIN) 5 MG tablet TAKE 1/2 TABLET BY MOUTH ON WEDNESDAY AND 1 TABLET ALL OTHER DAYS OR AS DIRECTED BY ANTICOAGULATION CLINIC. 08/11/16   Golden Circle, [name]    Family History Family History  Problem Relation Age of Onset  . Stroke Mother   . Heart disease Mother   . Emphysema Father   . Ovarian cancer Sister   . Stroke Sister   . Other Child     died at birth     Social History Social History  Substance Use Topics  . Smoking status: Never Smoker  . Smokeless tobacco: Never Used  . Alcohol use No     Allergies   Iodinated diagnostic agents; Ioxaglate; Milk-related compounds; Red dye; Whey; Darvon [propoxyphene]; and Hydralazine   Review of Systems Review of Systems  Constitutional: Negative.   HENT: Negative.   Respiratory: Negative.   Cardiovascular: Negative.   Gastrointestinal: Negative.   Musculoskeletal: Positive for arthralgias.  Skin: Negative.   Neurological: Positive for headaches.  Hematological: Bruises/bleeds easily.  Psychiatric/Behavioral: Negative.   All other systems reviewed and are negative.    Physical Exam Updated Vital Signs BP (!) 182/54 (BP Location: Left Arm)   Pulse 68   Temp 98.2 F (36.8 C) (Oral)   Resp 18   Ht 5\' 2"  (1.575 m)   Wt 133 lb (60.3 kg)   SpO2 95%   BMI 24.33 kg/m   Physical Exam  Constitutional: She is oriented to person, place, and time. She appears well-developed and well-nourished. She appears distressed.  Alert Glasgow Coma Score 15 appears mildly uncomfortable  HENT:  Right Ear: External ear normal.  Left Ear: External ear normal.  Call pulseless hematoma at 4 head otherwise normocephalic atraumatic no facial asymmetry  Eyes: Conjunctivae and EOM are normal. Pupils are equal, round, and reactive to light.  Neck: Neck supple. No tracheal deviation present. No thyromegaly present.  No tenderness  Cardiovascular: Normal rate.   No murmur heard. Irregularly irregular  Pulmonary/Chest: Effort normal and breath sounds normal.  Abdominal: Soft. Bowel sounds are normal. She exhibits no distension. There is no tenderness.  No contusion abrasion or tenderness  Musculoskeletal: Normal range of motion. She exhibits no edema or tenderness.  Entire spine nontender. Pelvis stable nontender. Right upper extremity skin intact. Ecchymotic swollen and tender at the wrist and at  hyperthenar eminence of hand. Radial pulse 2+. Good capillary refill. Bilateral lower extremities with abrasions and tenderness to anterior knees. No deformity no soft tissue swelling. DP pulse 2+ bilaterally  Neurological: She is alert and oriented to person, place, and time. No cranial nerve deficit. She exhibits normal muscle tone. Coordination normal.  Skin: Skin is warm and dry. Capillary refill takes less than 2 seconds. No rash noted.  Psychiatric: She has a normal mood and affect.  Nursing note and vitals reviewed.    ED Treatments / Results  Labs (all labs ordered are listed, but only abnormal results are displayed) Labs Reviewed  CBC WITH DIFFERENTIAL/PLATELET  PROTIME-INR  I-STAT CHEM 8, ED    EKG  EKG Interpretation None      X-rays viewed by me Radiology Dg Chest 2 View  Result Date: 11/02/2016 CLINICAL DATA:  Preop for cardiac catheterization. History of hypertension. EXAM: CHEST  2 VIEW COMPARISON:  10/10/2016 FINDINGS: There is mild enlargement of the cardiopericardial silhouette. Moderate-sized hiatal hernia lies behind the cardiac silhouette. No mediastinal or hilar masses. No convincing adenopathy. Lungs are clear.  No pleural effusion or pneumothorax. Skeletal structures are demineralized. There stable changes from a prior anterior cervical spine fusion. IMPRESSION: No acute cardiopulmonary disease. Electronically Signed   By: Lajean Manes M.D.   On: 11/02/2016 14:27   Dg Hand Complete Right  Result Date: 11/02/2016 CLINICAL DATA:  Right hand pain after fall today. EXAM: RIGHT HAND - COMPLETE 3+ VIEW COMPARISON:  None. FINDINGS: No dislocation is noted. Cortical regularity is seen involving the lateral portion of distal radius concerning for nondisplaced fracture. Severe narrowing and sclerosis of first carpometacarpal joint is noted. Soft tissues are unremarkable. IMPRESSION: Osteoarthritis of the first carpometacarpal joint. Possible nondisplaced fracture of distal  radius. Dedicated wrist radiographs are recommended for further evaluation. Electronically Signed   By: Marijo Conception, M.D.   On: 11/02/2016 15:09    Procedures Procedures (including critical care time)  Medications Ordered in ED Medications  morphine 4 MG/ML injection 4 mg (not administered)    Results for orders placed or performed during the hospital encounter of 11/02/16  CBC with Differential/Platelet  Result Value Ref Range   WBC 7.5 4.0 - 10.5 K/uL   RBC 4.89 3.87 - 5.11 MIL/uL   Hemoglobin 12.4 12.0 - 15.0 g/dL   HCT 39.9 36.0 - 46.0 %   MCV 81.6 78.0 - 100.0 fL   MCH 25.4 (L) 26.0 - 34.0 pg   MCHC 31.1 30.0 - 36.0 g/dL   RDW 19.1 (H) 11.5 - 15.5 %   Platelets 164 150 - 400 K/uL   Neutrophils Relative % 65 %   Neutro Abs 4.9 1.7 - 7.7 K/uL   Lymphocytes Relative 26 %   Lymphs Abs 2.0 0.7 - 4.0 K/uL   Monocytes Relative 8 %   Monocytes Absolute 0.6 0.1 - 1.0 K/uL   Eosinophils Relative 1 %   Eosinophils Absolute 0.1 0.0 - 0.7 K/uL   Basophils Relative 0 %   Basophils Absolute 0.0 0.0 - 0.1 K/uL  Protime-INR  Result Value Ref Range   Prothrombin Time 31.8 (H) 11.4 - 15.2 seconds   INR 3.00   I-stat chem 8, ed  Result Value Ref Range   Sodium 141 135 - 145 mmol/L   Potassium 4.0 3.5 - 5.1 mmol/L   Chloride 103 101 - 111 mmol/L   BUN 16 6 - 20 mg/dL   Creatinine, Ser 1.50 (H) 0.44 - 1.00 mg/dL   Glucose, Bld 122 (H) 65 - 99 mg/dL   Calcium, Ion 1.02 (L) 1.15 - 1.40 mmol/L   TCO2 29 0 - 100 mmol/L   Hemoglobin 12.9 12.0 - 15.0 g/dL   HCT 38.0 36.0 - 46.0 %   Ct Abdomen Pelvis Wo Contrast  Result Date: 10/10/2016 CLINICAL DATA:  Mid abdominal pain for 5 days with nausea. Contrast allergy. EXAM: CT ABDOMEN AND PELVIS WITHOUT CONTRAST TECHNIQUE: Multidetector CT imaging  of the abdomen and pelvis was performed following the standard protocol without IV contrast. COMPARISON:  05/31/2015, 05/05/2015 FINDINGS: Lower chest: Normal heart size. No pericardial effusion.  Native coronary atherosclerosis evident. Mitral valve annular calcifications present. Lower thoracic aorta is atherosclerotic. Small pleural effusions noted layering dependently, larger on the right. There is associated bibasilar compressive atelectasis. Large hiatal hernia noted. Nonspecific masslike wall thickening of the stomach, images 3 through 16, series 2. The patient does have a history of GE junction carcinoma. Hepatobiliary: No focal hepatic abnormality within the limits of noncontrast imaging. No intrahepatic biliary dilatation. Remote cholecystectomy noted. Stable common bile duct dilatation, suspect postcholecystectomy related. Pancreas: Unremarkable. No pancreatic ductal dilatation or surrounding inflammatory changes. Spleen: Spleen is normal in size. Stable peripherally calcified 1.7 cm benign splenic lesion anteriorly. Adrenals/Urinary Tract: Adrenal glands are unremarkable. Kidneys are normal, without renal calculi, focal lesion, or hydronephrosis. Bladder is unremarkable. Stomach/Bowel: Large hiatal hernia and masslike gastric wall thickening, nonspecific. Negative for bowel obstruction, significant dilatation, ileus, or free air. Appendix not visualized. Scattered colonic diverticulosis. No acute inflammatory process. No fluid collection or abscess. Vascular/Lymphatic: Abdominal atherosclerosis noted of the visceral vasculature. Aortic atherosclerosis present without aneurysm. No retroperitoneal abnormality or hemorrhage. No adenopathy. Reproductive: Uterus is atrophic. No significant adnexal abnormality. No pelvic free fluid. Other: No abdominal wall hernia or abnormality. No abdominopelvic ascites. Musculoskeletal: Diffuse degenerative spondylosis of the visualized spine and. Lower lumbar posterior fusion noted at L4-5 between the spinous processes. No acute compression fracture. Mild associated scoliosis. IMPRESSION: Small pleural effusions with bibasilar compressive atelectasis, slightly  larger on the right. Large hiatal hernia with masslike thickening of the stomach wall, nonspecific however the patient does have a history of the GE junction carcinoma. Aortic atherosclerosis.  Native coronary atherosclerosis. Remote cholecystectomy with stable common bile duct dilatation. No acute process in the abdomen or pelvis by noncontrast imaging. Electronically Signed   By: Jerilynn Mages.  Shick M.D.   On: 10/10/2016 15:29   Dg Chest 2 View  Result Date: 11/02/2016 CLINICAL DATA:  Preop for cardiac catheterization. History of hypertension. EXAM: CHEST  2 VIEW COMPARISON:  10/10/2016 FINDINGS: There is mild enlargement of the cardiopericardial silhouette. Moderate-sized hiatal hernia lies behind the cardiac silhouette. No mediastinal or hilar masses. No convincing adenopathy. Lungs are clear.  No pleural effusion or pneumothorax. Skeletal structures are demineralized. There stable changes from a prior anterior cervical spine fusion. IMPRESSION: No acute cardiopulmonary disease. Electronically Signed   By: Lajean Manes M.D.   On: 11/02/2016 14:27   Dg Wrist Complete Right  Result Date: 11/02/2016 CLINICAL DATA:  [place] today.  Wrist and hand pain. EXAM: RIGHT WRIST - COMPLETE 3+ VIEW COMPARISON:  None. FINDINGS: Nondisplaced fracture of the radial styloid. No other acute wrist fracture. Chronic degenerative changes at the first carpal/metacarpal joint. IMPRESSION: Nondisplaced radial styloid fracture. Electronically Signed   By: Nelson Chimes M.D.   On: 11/02/2016 18:26   Ct Head Wo Contrast  Result Date: 11/02/2016 CLINICAL DATA:  Tripped and fell on concrete abrasion to the right side of forehead EXAM: CT HEAD WITHOUT CONTRAST TECHNIQUE: Contiguous axial images were obtained from the base of the skull through the vertex without intravenous contrast. COMPARISON:  MRI 08/24/2014, CT brain 08/24/2014 FINDINGS: Brain: No acute territorial infarction, hemorrhage or intracranial mass is visualized. Old right superior  cerebellar infarct. Old right basal ganglia infarct. Stable ventricle size. Mild atrophy. Minimal periventricular small vessel ischemic changes. Vascular: No hyperdense vessels. Carotid artery calcifications. Vertebral artery calcifications. Skull: No  fracture.  No suspicious bone lesion. Sinuses/Orbits: No acute finding. Other: Small right forehead hematoma IMPRESSION: 1. No CT evidence for acute intracranial abnormality. Small right forehead soft tissue hematoma 2. Old right basal ganglial and superior cerebellar infarcts. Electronically Signed   By: Donavan Foil M.D.   On: 11/02/2016 19:15   Dg Chest Portable 1 View  Result Date: 10/10/2016 CLINICAL DATA:  Shortness of breath. EXAM: PORTABLE CHEST 1 VIEW COMPARISON:  Radiograph June 23, 2016. FINDINGS: Stable cardiomediastinal silhouette. Atherosclerosis of thoracic aorta is noted. Mild central pulmonary vascular congestion is noted. No pneumothorax is noted. Mild bibasilar densities are noted concerning for edema or atelectasis. Bony thorax is unremarkable. IMPRESSION: Aortic atherosclerosis. Mild bibasilar edema or atelectasis. Mild central pulmonary vascular congestion. Electronically Signed   By: Marijo Conception, M.D.   On: 10/10/2016 11:56   Dg Knee Complete 4 Views Left  Result Date: 11/02/2016 CLINICAL DATA:  Gold[place] Circle today.  Pain. EXAM: LEFT KNEE - COMPLETE 4+ VIEW COMPARISON:  None FINDINGS: Mild medial compartment joint space narrowing. Mild patellofemoral osteoarthritis. No evidence of fracture, dislocation or other focal finding. Regional arterial calcification is present. IMPRESSION: No acute or traumatic finding.  Medial compartment osteoarthritis. Electronically Signed   By: Nelson Chimes M.D.   On: 11/02/2016 18:27   Dg Knee Complete 4 Views Right  Result Date: 11/02/2016 CLINICAL DATA:  Gold[place] Circle today knee pain. EXAM: RIGHT KNEE - COMPLETE 4+ VIEW COMPARISON:  None. FINDINGS: Mild media compartment osteoarthritis. Regional arterial  calcification. No joint effusion. No evidence of fracture. IMPRESSION: Medial compartment osteoarthritis.  No acute finding. Electronically Signed   By: Nelson Chimes M.D.   On: 11/02/2016 18:28   Dg Hand Complete Right  Result Date: 11/02/2016 CLINICAL DATA:  Golden Circle.  Pain. EXAM: RIGHT HAND - COMPLETE 3+ VIEW COMPARISON:  Earlier same day FINDINGS: Nondisplaced fracture of the radial styloid again demonstrated. Chronic osteoarthritis of the first carpal/metacarpal joint. No other significant finding in the hand. IMPRESSION: Nondisplaced fracture of the radial styloid. Chronic osteoarthritis at the first carpal/metacarpal joint. Electronically Signed   By: Nelson Chimes M.D.   On: 11/02/2016 18:29   Dg Hand Complete Right  Result Date: 11/02/2016 CLINICAL DATA:  Right hand pain after fall today. EXAM: RIGHT HAND - COMPLETE 3+ VIEW COMPARISON:  None. FINDINGS: No dislocation is noted. Cortical regularity is seen involving the lateral portion of distal radius concerning for nondisplaced fracture. Severe narrowing and sclerosis of first carpometacarpal joint is noted. Soft tissues are unremarkable. IMPRESSION: Osteoarthritis of the first carpometacarpal joint. Possible nondisplaced fracture of distal radius. Dedicated wrist radiographs are recommended for further evaluation. Electronically Signed   By: Marijo Conception, M.D.   On: 11/02/2016 15:09   Initial Impression / Assessment and Plan / ED Course  I have reviewed the triage vital signs and the nursing notes.  Pertinent labs & imaging results that were available during my care of the patient were reviewed by me and considered in my medical decision making (see chart for details).   pain is improved and tolerable after treatment with intravenous morphine.  Patient is alert and ambulatory after treatment with intravenous morphine. Short arm splint was placed on right forearm by orthopedic technician with sling which is comfortable for patient Plan referral  Dr. Fredna Dow. She has tramadol at home which she can take for pain.Had pressure recheck one week Final Clinical Impressions(s) / ED Diagnoses   #1 fall #2 closed fracture of right wrist #3 minor closed head  trauma #4 mild renal insufficiency 5 elevated blood pressure Final diagnoses:  None  # 6abrasions to both knees #7 contusions multiple sites  New Prescriptions New Prescriptions   No medications on file     Orlie Dakin, MD 11/02/16 2149    Orlie Dakin, MD 11/03/16 0003

## 2016-11-02 NOTE — Telephone Encounter (Signed)
Informed Yvette at [facility], she will continue with chest xray and inform pt to go to UC/[facility]

## 2016-11-02 NOTE — Telephone Encounter (Signed)
Pt sates she saw Cindy yesterday for BP and Coum, her BP was elevated and Jenny Reichmann went to get Marya Amsler, states Marya Amsler was supposed to order the Pt Clonodine, but it was not put in.   Please advise.  [employer].

## 2016-11-02 NOTE — Telephone Encounter (Signed)
Sent in this morning

## 2016-11-02 NOTE — Telephone Encounter (Signed)
Pt needs to go to UC/ER to have treated

## 2016-11-02 NOTE — Progress Notes (Signed)
Orthopedic Tech Progress Note Patient Details:  [name] 1941/03/26 [id number]  Ortho Devices Type of Ortho Device: Short arm splint Ortho Device/Splint Location: applied short arm splint to pt right hand/wrist.  pt tolerated well.  provided arm sling to help pt keep hand up.  right arm wrist.  Ortho Device/Splint Interventions: Application   Kristopher Oppenheim 11/02/2016, 8:44 PM

## 2016-11-02 NOTE — Discharge Instructions (Signed)
X-rays show that you have a small fracture(broken bone )in your right wrist take her tramadol as prescribed. Call Dr. Levell July office tomorrow to schedule an appointment for within the next 1-2 weeks. Tell office staff that you were seen here and had x-rays done here. Call your primary care physician tomorrow to recheck your blood pressure within a week. Today's was elevated at 174/69. Your blood creatinine which is a function of your kidneys should be rechecked as well within a week. This can be done by your primary care physician. Today's was mildly elevated at 1.5. Your INR today was 3.0. Take your Coumadin(warfarin) as directed by your doctor

## 2016-11-02 NOTE — ED Triage Notes (Signed)
Per Pt, Pt was walking out of [employer] when she tripped over a concrete spacer in the parking lot. Pt fell hit both of her knees, right wrist, and has an abrasions to her right head. Pt reports having her right wrist xrayed at [facility] and it was noted to be fractured. Reports being on blood thinners. Alert and Oriented x4 upon arrival.

## 2016-11-02 NOTE — Telephone Encounter (Addendum)
Notified pt w/Greg response.Pt also wanted to let Marya Amsler know she is schedule for the heart catherization on next Thursday!!../LMB

## 2016-11-02 NOTE — Telephone Encounter (Signed)
Noted  

## 2016-11-02 NOTE — ED Notes (Signed)
Patient transported to X-ray 

## 2016-11-02 NOTE — Patient Instructions (Addendum)
Medication Instructions:  STOP DILTIAZEM STOP METOPROLOL START AMLODIPINE 10 MG DAILY START CARVEDILOL 12.5 MG TWICE DAILY START ASPIRIN 81 MG DAILY  If you need a refill on your cardiac medications before your next appointment, please call your pharmacy.  Testing/Procedures: Your physician has requested that you have a cardiac catheterization. Cardiac catheterization is used to diagnose and/or treat various heart conditions. Doctors may recommend this procedure for a number of different reasons. The most common reason is to evaluate chest pain. Chest pain can be a symptom of coronary artery disease (CAD), and cardiac catheterization can show whether plaque is narrowing or blocking your heart's arteries. This procedure is also used to evaluate the valves, as well as measure the blood flow and oxygen levels in different parts of your heart. For further information please visit [email or web address]. Please follow instruction sheet, as given.  Follow-Up: Your physician wants you to follow-up in: 2-3 WEEKS AFTER CATH PROCEDURE   Thank you for choosing [facility] at [facility]!!        [facility] [address] [place] [address] Dept: [phone] Loc: [phone]  JORJA EMPIE  11/02/2016  You are scheduled for a Cardiac Catheterization on Wednesday, May 9 with Dr. Glenetta Hew.  1. Please arrive at the [facility] (Main Entrance A) at [facility]: [address] [place], [place] [address] at 6:30 AM (two hours before your procedure to ensure your preparation). Free valet parking service is available.   Special note: Every effort is made to have your procedure done on time. Please understand that emergencies sometimes delay scheduled procedures.  2. Diet: Do not eat or drink anything after midnight prior to your procedure except sips of water to take medications.  3. Labs and  chest xray: HAVE LABS DRAWN BY MONDAY 11-06-2016 and Chest X-Ray completed by 11-06-2016  4. Medication instructions in preparation for your procedure:  HOLD WARFARIN STARTING Friday 11-03-2016 HOLD LASIX THE DAY OF THE PROCEDURE    On the morning of your procedure, take your Aspirin and any morning medicines NOT listed above.  You may use sips of water.  -- take prednisone, benadryl, pepcid as directed (for IV dye allergy prophylaxis)  5. Plan for one night stay--bring personal belongings. 6. Bring a current list of your medications and current insurance cards. 7. You MUST have a responsible person to drive you home. 8. Someone MUST be with you the first 24 hours after you arrive home or your discharge will be delayed. 9. Please wear clothes that are easy to get on and off and wear slip-on shoes.  Thank you for allowing [place] to care for you!   -- [facility]

## 2016-11-02 NOTE — Progress Notes (Signed)
OFFICE NOTE  Chief Complaint:  Chest pain, uncontrolled hypertension  Primary Care Physician: Mauricio Po, [name]  HPI:  [name] is a pleasant 76 year old female kindly referred to me be Dr. Ardeth Perfect. She reports a past medical history significant for atrial fibrillation and congestive heart failure, which occurred around the same time and probably 2012. She vaguely recalls seeing Dr. Irish Lack at that time.  She was started on warfarin therapy and no attempts were made to get her back into rhythm as she was paroxysmal. She does has a long-standing history of hypertension and dyslipidemia. She apparently developed heart failure or however has never had reassessment of her ejection fraction. She subsequently was having health issues and decided to move with her son to live in [place]. She says at that time she had some problems with chest pain and ultimately underwent a stress test. This was associated with an anaphylactic type reaction and despite their reassurances she will "no longer have any more stress test". She says she has a severe allergy to IV contrast dye which causes throat swelling as well as milk which also causes the same symptoms. Apparently she also underwent cardiac catheterization however she says that only medical therapy was recommended. This was in [place], [place].  We are trying to request records from there as well. She subsequently moved with her son to [facility], however he lost his job and she recently moved back to [place]. Currently she denies any significant shortness of breath but does report some mild leg edema which is slightly worse and occasional pain in her left chest and left arm over the last several days. She's also had pain in the right chest. None of those symptoms are worse with exertion or relieved by rest or medication.   [name] returns today for followup. She again appears upset and quite nervous. Blood pressure recently has been  significantly elevated. In fact she went to the emergency room and was hospitalized for blood pressure control. Although it seems that her blood pressure more easily was controlled once her oral medicines were restarted. Subsequently she's been started on clonidine is now up to twice a day. Blood pressure today was still high at 607 systolic. She reports some occasional chest discomfort, mostly at rest and reports not being active enough to know if she has symptoms with exertion. She also has pain all over body significant low back pain and is seeing a specialist about that in the next few weeks.  I saw [name] back in the office today. She reports some improvement in her chest discomfort and I think this is related to blood pressure. The recent switch in her blood pressure medicines indicate a marked improvement in her pressures in she is noted to be 130/66 today. She does get some slight shortness of breath with exertion and no regular chest discomfort. At this time there is not clear evidence to pursue a further workup although I have a low threshold for further cardiac workup if she should become more symptomatic. She is in sinus rhythm and seems to be maintaining that.  [name] returns today for follow-up. She's had a remarkable past 6 months. Unfortunately she was diagnosed with a gastric cancer which was considered very high risk to resect. She was referred to [facility] for an endoscopic resection which was successful with clean margins. Prior to that she underwent cardiac evaluation by [facility] cardiology including a nuclear stress test which was low risk and  didn't involve exercise. She reports that during the last minute of exercise she went into A. fib. She's had recurrent A. fib on and off and in fact was just hospitalized again for A. fib a few weeks ago which converted spontaneously back to sinus rhythm, prior to cardiology is evaluating her. She was then directed to follow-up with me. She  has fortunately been on warfarin with therapeutic INRs. Of note, I did review her cardiac catheterization which was performed at an outside hospital 2014 which did demonstrate an 80% mid RCA stenosis as well as a 70% OM lesion and some mild to moderate disease of other vessels. At the time no stent was recommended, which leads me to believe these may be smaller vessels. The fact that her most recent stress test was again negative suggest that they are not flow limiting even though they would seem significant. Also, today she is describing left hand pain. She says that there is exquisite tenderness the left hand and it's difficult for her to extend her finger. She often notes that her left middle finger gets stuck. On palpation I do not notice any significant arthritis of the joints or nodules on the tendons.  04/19/2016  [name] returns today for follow-up. She is concerned about shortness of breath which is been progressive. She reports recently she's had some loss of vocal power and noted some upper airway wheezing. We and related with her on the office today and her oxygen saturations were noted to be 97% at rest and that came down to 95% with ambulation. EKG shows sinus rhythm at 66. Blood pressure is mildly elevated today. She has a nonproductive cough but denies fever, chills or sweats. Weight is up to 141 from 130 pounds 6 months ago.  07/11/2015  [name] returns today for follow-up. She seems to be doing fairly well. She denies any worsening shortness of breath or chest pain. She seems to be suffering from some acid reflux and will likely be undergoing another EGD with Dr. Benson Norway. She continues to have problems with voice changes which could be due to vocal cord inflammation related to her reflux.  09/21/2016  [name] is here today for follow-up. She had an INR check in the office. She is scheduled to have upcoming surgery for large hiatal hernia. Warm to [facility] by Dr. Precious Bard. Risk  factors for surgery including aortic stenosis, however this is recently assessed and is moderate. She denies any chest pain or anginal symptoms.  11/02/2016  [name] returns for follow-up today. Recently she's been having uncontrolled hypertension. 5 blood pressures been over 751 systolic. She's not had a history with difficult to control hypertension in the past. He's also had associated chest discomfort. The other day she had severe chest discomfort and she has had headaches. In 2013 she underwent cardia catheterization in [place] which showed some moderate coronary disease including what appears to be an 80% stenosis of the RCA however was not treated at the time. She also had proximal LAD disease which was mild to moderate. I'm concerned that she's had progression of her coronary artery disease as a possible cause of her uncontrolled hypertension. She reports the pain has occurred at rest but can occur with exertion. She also gets short of breath with exertion. She's felt more fatigue as well. The pain is described as sharp and nonradiating in the central chest. Unfortunate she's on warfarin for PAF and will need to hold that for 5 days prior  to the procedure to safely undergo cardiac catheterization. Although he recently cleared her for hernia repair, she never underwent the surgery as she was felt to be at higher risk. Given this new information I would not recommend hernia surgery in the near future until we have a better idea of her coronary arteries.  PMHx:  Past Medical History:  Diagnosis Date  . Anemia 2005   Generally microcytic, transfusions in [address], 2012, 02/2015, 05/2015  . Aortic stenosis    Moderate November 2017  . Arthritis   . Broken back 2013   Chronic back pain.   [name] CAD (coronary artery disease)    Cardiac catheterization 2014 - 80% mid RCA and 70% OM managed medically  . CHF (congestive heart failure) ([facility])   . Contrast media allergy   . Diastolic heart failure ([place])     . Esophageal cancer ([place]) 05/06/2015   Adenocarcinoma GE junction  . GERD (gastroesophageal reflux disease)   . H/O iron deficiency    05-06-15 iron infusion ([facility])  . HH (hiatus hernia) 2008   Large with associated erosions  . Hypertension   . Paroxysmal atrial fibrillation ([name])   . PONV (postoperative nausea and vomiting)   . Pulmonary emboli ([place]) 2008, 2012  . Stroke ([place])   . Transfusion history    2 units transfused 05-06-15 ([facility])    Past Surgical History:  Procedure Laterality Date  . APPENDECTOMY  1950s  . BACK SURGERY    . CARPAL TUNNEL RELEASE Bilateral 1990s  . [place] SURGERY  2014  . CHOLECYSTECTOMY  1980s   open  . COLONOSCOPY N/A 02/17/2015   Procedure: COLONOSCOPY;  Surgeon: [name], MD;  Location: [facility] ENDOSCOPY;  Service: Endoscopy;  Laterality: N/A;  . ESOPHAGOGASTRODUODENOSCOPY N/A 02/16/2015   Procedure: ESOPHAGOGASTRODUODENOSCOPY (EGD);  Surgeon: [name], MD;  Location: [facility] ENDOSCOPY;  Service: Endoscopy;  Laterality: N/A;  . ESOPHAGOGASTRODUODENOSCOPY N/A 05/06/2015   Procedure: ESOPHAGOGASTRODUODENOSCOPY (EGD);  Surgeon: [name], MD;  Location: [facility] ENDOSCOPY;  Service: Endoscopy;  Laterality: N/A;  . EUS N/A 05/20/2015   Procedure: UPPER ENDOSCOPIC ULTRASOUND (EUS) LINEAR;  Surgeon: [name], MD;  Location: [facility] ENDOSCOPY;  Service: Endoscopy;  Laterality: N/A;  . exploratory lab  1950s or 1960s  . GIVENS CAPSULE STUDY N/A 05/06/2015   Procedure: GIVENS CAPSULE STUDY;  Surgeon: [name], MD;  Location: [facility] ENDOSCOPY;  Service: Endoscopy;  Laterality: N/A;  . lumbar back surgery  2012  . [place] SURGERY  1991  . TONSILLECTOMY    . TONSILLECTOMY AND ADENOIDECTOMY  1960s    FAMHx:  Family History  Problem Relation Age of Onset  . Stroke Mother   . Heart disease Mother   . Emphysema Father   . Ovarian cancer Sister   . Stroke Sister   . Other Child     died at birth    SOCHx:   reports that she has never  smoked. She has never used smokeless tobacco. She reports that she does not drink alcohol or use drugs.  ALLERGIES:  Allergies  Allergen Reactions  . Iodinated Diagnostic Agents Anaphylaxis    IPD dye Info given by patient  . Ioxaglate Anaphylaxis    Info given by patient  . Milk-Related Compounds Anaphylaxis    Lactose intolerance  . Red Dye Anaphylaxis  . Whey     Lactose intolerance  . Darvon [Propoxyphene] Rash  . Hydralazine Anxiety    Facial flushing, pt prefers not to use  it.     ROS: Pertinent items noted in HPI and remainder of comprehensive ROS otherwise negative.  HOME MEDS: Current Outpatient Prescriptions  Medication Sig Dispense Refill  . acetaminophen (TYLENOL) 500 MG tablet Take 500-1,000 mg by mouth daily as needed for mild pain or moderate pain.     [name] albuterol (PROVENTIL HFA;VENTOLIN HFA) 108 (90 Base) MCG/ACT inhaler Inhale 2 puffs into the lungs every 4 (four) hours as needed for wheezing or shortness of breath. 1 Inhaler 1  . budesonide-formoterol (SYMBICORT) 160-4.5 MCG/ACT inhaler Inhale 2 puffs into the lungs 2 (two) times daily. 1 Inhaler 0  . cyanocobalamin (,VITAMIN B-12,) 1000 MCG/ML injection IM daily 11/29-12/3, then weekly x4, then monthly. 9 mL 0  . diltiazem (CARDIZEM CD) 120 MG 24 hr capsule Take 1 capsule (120 mg total) by mouth daily. (Patient taking differently: Take 120 mg by mouth 2 (two) times daily at 10 AM and 5 PM. Take 2 times daily beginning 4/24.) 30 capsule 5  . ferrous sulfate 325 (65 FE) MG tablet Take 325 mg by mouth daily with breakfast.    . furosemide (LASIX) 40 MG tablet Take 1 tablet (40 mg total) by mouth daily. 30 tablet 5  . gabapentin (NEURONTIN) 100 MG capsule TAKE 1 CAPSULE BY MOUTH THREE TIMES A DAY. 270 capsule 0  . HYDROcodone-acetaminophen (NORCO/VICODIN) 5-325 MG tablet Take 1 tablet by mouth every 6 (six) hours as needed for moderate pain.    . metoprolol (LOPRESSOR) 50 MG tablet TAKE 1 TABLET BY MOUTH TWICE DAILY.  (Patient taking differently: Take 1 1/2 tablets twice daily) 180 tablet 0  . pantoprazole (PROTONIX) 40 MG tablet Take 1 tablet (40 mg total) by mouth 2 (two) times daily. 180 tablet 0  . Pediatric Multivitamins-Iron (FLINTSTONES PLUS IRON) chewable tablet Chew 2 tablets by mouth daily. 60 tablet 0  . simvastatin (ZOCOR) 20 MG tablet TAKE (1) TABLET BY MOUTH AT BEDTIME. 90 tablet 0  . temazepam (RESTORIL) 15 MG capsule TAKE 1 CAPSULE BY MOUTH AT BEDTIME. 30 capsule 2  . traMADol (ULTRAM) 50 MG tablet TAKE (1) TABLET BY MOUTH EVERY EIGHT HOURS. 90 tablet 0  . warfarin (COUMADIN) 5 MG tablet TAKE 1/2 TABLET BY MOUTH ON WEDNESDAY AND 1 TABLET ALL OTHER DAYS OR AS DIRECTED BY ANTICOAGULATION CLINIC. 90 tablet 1  . cloNIDine (CATAPRES - DOSED IN MG/24 HR) 0.1 mg/24hr patch Place 1 patch (0.1 mg total) onto the skin once a week. 4 patch 3   No current facility-administered medications for this visit.     LABS/IMAGING: Results for orders placed or performed in visit on 11/01/16 (from the past 48 hour(s))  POCT INR     Status: None   Collection Time: 11/01/16 12:00 AM  Result Value Ref Range   INR 3.5    No results found.  VITALS: BP (!) 170/68   Pulse 62   Ht 5\' 2"  (1.575 m)   Wt 133 lb (60.3 kg)   BMI 24.33 kg/m   EXAM: General appearance: alert and no distress Neck: no carotid bruit, no JVD and thyroid not enlarged, symmetric, no tenderness/mass/nodules Lungs: clear to auscultation bilaterally Heart: regular rate and rhythm, S1, S2 normal and systolic murmur: midsystolic 3/6, crescendo at 2nd right intercostal space Abdomen: mild TTP Extremities: varicose veins noted and no edema Pulses: 2+ and symmetric Skin: Skin color, texture, turgor normal. No rashes or lesions Neurologic: Grossly normal Psych: Mildly anxious  EKG: Deferred  ASSESSMENT: 1. Unstable angina 2. Uncontrolled hypertension  3. Paroxysmal atrial fibrillation on warfarin 4. History of diastolic congestive heart  failure - EF 60-65% on echo 5. Dyslipidemia 6. Moderate aortic stenosis - 05/2016 7. Right hand pain with contracture 8. Recent GI cancer/resected  PLAN: 1.   [name] presents with progressive chest discomfort which is concerning for unstable angina. She does have a history of some coronary artery disease by cardiac catheterization in 2013. She's also had recent uncontrolled hypertension which is new for her. In order to gain better control that I recommend adjusting her medications. We'll switch diltiazem which was recently increased over amlodipine 10 mg daily. We'll also change her metoprolol to carvedilol 12.5 mg twice a day. Start low-dose aspirin 81 mg daily. She'll need to hold warfarin for this 5 days prior to cardiac catheterization. I discussed the risks, benefits and alternatives chronic catheterization with her today and she is agreeable to proceed.  Follow-up with me afterwards.  Pixie Casino, MD, [facility] Attending Cardiologist [place] 11/02/2016, 10:25 AM

## 2016-11-02 NOTE — Telephone Encounter (Signed)
Spoke with Dewaine Oats, pt is there at [facility] having a chest xray that we ordered. Pt states that she fell in the [employer] parking lot and her right hand is in a lot of pain. She is asking if Dr Debara Pickett could add xray for her right hand.

## 2016-11-02 NOTE — ED Notes (Signed)
Dr. Winfred Leeds notified on pt.'s left knee pain and right wrist pain 8/10.

## 2016-11-02 NOTE — Addendum Note (Signed)
Addended by: Fidel Levy on: 11/02/2016 11:28 AM   Modules accepted: Orders, SmartSet

## 2016-11-03 ENCOUNTER — Telehealth: Payer: Self-pay | Admitting: Internal Medicine

## 2016-11-03 LAB — PROTIME-INR
INR: 2.9 — ABNORMAL HIGH
PROTHROMBIN TIME: 29.4 s — AB (ref 9.0–11.5)

## 2016-11-03 LAB — APTT: aPTT: 37 s — ABNORMAL HIGH (ref 22–34)

## 2016-11-03 NOTE — Telephone Encounter (Signed)
s/w Dr Debara Pickett continue with Cath-will need to have before she can have any surgery to her R wrist.  Pt notified she will continue with Cath procedure 11-08-16

## 2016-11-03 NOTE — Telephone Encounter (Signed)
New message       Pt is scheduled for a cath on 11-08-16. She broke her rt wrist.  Can she still have her cath?

## 2016-11-06 ENCOUNTER — Telehealth: Payer: Self-pay

## 2016-11-06 NOTE — Telephone Encounter (Signed)
Pt has an appt lab/[facility] on 5/8. She is cancelling this appt d/t she fell on Thursday and has broken ribs, bruised knees an a knot on her head.

## 2016-11-07 ENCOUNTER — Other Ambulatory Visit: Payer: Medicare Other

## 2016-11-07 ENCOUNTER — Ambulatory Visit: Payer: Medicare Other | Admitting: Oncology

## 2016-11-08 ENCOUNTER — Ambulatory Visit (HOSPITAL_COMMUNITY)
Admission: RE | Admit: 2016-11-08 | Discharge: 2016-11-08 | Disposition: A | Discharge status: Home / Self Care | Payer: Medicare Other | Source: Ambulatory Visit | Attending: Cardiology | Admitting: Cardiology

## 2016-11-08 ENCOUNTER — Encounter (HOSPITAL_COMMUNITY)
Admission: RE | Disposition: A | Discharge status: Home / Self Care | Payer: Self-pay | Source: Ambulatory Visit | Attending: Cardiology

## 2016-11-08 DIAGNOSIS — I110 Hypertensive heart disease with heart failure: Secondary | ICD-10-CM | POA: Diagnosis not present

## 2016-11-08 DIAGNOSIS — I2584 Coronary atherosclerosis due to calcified coronary lesion: Secondary | ICD-10-CM | POA: Insufficient documentation

## 2016-11-08 DIAGNOSIS — E785 Hyperlipidemia, unspecified: Secondary | ICD-10-CM | POA: Diagnosis not present

## 2016-11-08 DIAGNOSIS — M545 Low back pain: Secondary | ICD-10-CM | POA: Diagnosis not present

## 2016-11-08 DIAGNOSIS — Z86711 Personal history of pulmonary embolism: Secondary | ICD-10-CM | POA: Insufficient documentation

## 2016-11-08 DIAGNOSIS — Z7951 Long term (current) use of inhaled steroids: Secondary | ICD-10-CM | POA: Diagnosis not present

## 2016-11-08 DIAGNOSIS — I350 Nonrheumatic aortic (valve) stenosis: Secondary | ICD-10-CM | POA: Insufficient documentation

## 2016-11-08 DIAGNOSIS — E739 Lactose intolerance, unspecified: Secondary | ICD-10-CM | POA: Insufficient documentation

## 2016-11-08 DIAGNOSIS — I5032 Chronic diastolic (congestive) heart failure: Secondary | ICD-10-CM | POA: Diagnosis not present

## 2016-11-08 DIAGNOSIS — Z91041 Radiographic dye allergy status: Secondary | ICD-10-CM | POA: Insufficient documentation

## 2016-11-08 DIAGNOSIS — I25110 Atherosclerotic heart disease of native coronary artery with unstable angina pectoris: Secondary | ICD-10-CM

## 2016-11-08 DIAGNOSIS — Z8673 Personal history of transient ischemic attack (TIA), and cerebral infarction without residual deficits: Secondary | ICD-10-CM | POA: Diagnosis not present

## 2016-11-08 DIAGNOSIS — R0789 Other chest pain: Secondary | ICD-10-CM | POA: Diagnosis not present

## 2016-11-08 DIAGNOSIS — Z7901 Long term (current) use of anticoagulants: Secondary | ICD-10-CM | POA: Diagnosis not present

## 2016-11-08 DIAGNOSIS — R079 Chest pain, unspecified: Secondary | ICD-10-CM

## 2016-11-08 DIAGNOSIS — I480 Paroxysmal atrial fibrillation: Secondary | ICD-10-CM | POA: Diagnosis not present

## 2016-11-08 DIAGNOSIS — G8929 Other chronic pain: Secondary | ICD-10-CM | POA: Insufficient documentation

## 2016-11-08 DIAGNOSIS — I200 Unstable angina: Secondary | ICD-10-CM | POA: Diagnosis present

## 2016-11-08 DIAGNOSIS — I10 Essential (primary) hypertension: Secondary | ICD-10-CM | POA: Diagnosis present

## 2016-11-08 DIAGNOSIS — M1990 Unspecified osteoarthritis, unspecified site: Secondary | ICD-10-CM | POA: Insufficient documentation

## 2016-11-08 DIAGNOSIS — I2510 Atherosclerotic heart disease of native coronary artery without angina pectoris: Secondary | ICD-10-CM | POA: Insufficient documentation

## 2016-11-08 DIAGNOSIS — I5030 Unspecified diastolic (congestive) heart failure: Secondary | ICD-10-CM | POA: Diagnosis present

## 2016-11-08 DIAGNOSIS — K219 Gastro-esophageal reflux disease without esophagitis: Secondary | ICD-10-CM | POA: Insufficient documentation

## 2016-11-08 HISTORY — PX: LEFT HEART CATH AND CORONARY ANGIOGRAPHY: CATH118249

## 2016-11-08 LAB — BASIC METABOLIC PANEL
Anion gap: 10 (ref 5–15)
BUN: 12 mg/dL (ref 6–20)
CALCIUM: 9.5 mg/dL (ref 8.9–10.3)
CO2: 25 mmol/L (ref 22–32)
CREATININE: 0.88 mg/dL (ref 0.44–1.00)
Chloride: 101 mmol/L (ref 101–111)
Glucose, Bld: 281 mg/dL — ABNORMAL HIGH (ref 65–99)
Potassium: 4.5 mmol/L (ref 3.5–5.1)
SODIUM: 136 mmol/L (ref 135–145)

## 2016-11-08 LAB — PROTIME-INR
INR: 1.21
Prothrombin Time: 15.4 seconds — ABNORMAL HIGH (ref 11.4–15.2)

## 2016-11-08 SURGERY — LEFT HEART CATH AND CORONARY ANGIOGRAPHY
Anesthesia: LOCAL

## 2016-11-08 MED ORDER — SODIUM CHLORIDE 0.9% FLUSH: 3.0000 mL | INTRAVENOUS | Status: DC | PRN

## 2016-11-08 MED ORDER — ACETAMINOPHEN 325 MG PO TABS
ORAL_TABLET | ORAL | Status: AC
  Filled 2016-11-08: qty 2

## 2016-11-08 MED ORDER — SODIUM CHLORIDE 0.9 % IV SOLN: INTRAVENOUS | Status: AC

## 2016-11-08 MED ORDER — MIDAZOLAM HCL 2 MG/2ML IJ SOLN
INTRAMUSCULAR | Status: AC
  Filled 2016-11-08: qty 2

## 2016-11-08 MED ORDER — IOPAMIDOL (ISOVUE-370) INJECTION 76%
INTRAVENOUS | Status: DC | PRN
  Administered 2016-11-08: 130 mL via INTRA_ARTERIAL

## 2016-11-08 MED ORDER — FENTANYL CITRATE (PF) 100 MCG/2ML IJ SOLN
INTRAMUSCULAR | Status: DC | PRN
  Administered 2016-11-08: 25 ug via INTRAVENOUS

## 2016-11-08 MED ORDER — ONDANSETRON HCL 4 MG/2ML IJ SOLN: 4.0000 mg | Freq: Four times a day (QID) | INTRAMUSCULAR | Status: DC | PRN

## 2016-11-08 MED ORDER — DIPHENHYDRAMINE HCL 50 MG/ML IJ SOLN
25.0000 mg | Freq: Once | INTRAMUSCULAR | Status: AC
  Administered 2016-11-08: 25 mg via INTRAVENOUS

## 2016-11-08 MED ORDER — ASPIRIN 81 MG PO CHEW: 81.0000 mg | CHEWABLE_TABLET | ORAL | Status: DC

## 2016-11-08 MED ORDER — SODIUM CHLORIDE 0.9 % IV SOLN
INTRAVENOUS | Status: DC
  Administered 2016-11-08: 07:00:00 via INTRAVENOUS

## 2016-11-08 MED ORDER — LIDOCAINE HCL (PF) 1 % IJ SOLN
INTRAMUSCULAR | Status: DC | PRN
  Administered 2016-11-08: 15 mL via INTRADERMAL

## 2016-11-08 MED ORDER — IOPAMIDOL (ISOVUE-370) INJECTION 76%
INTRAVENOUS | Status: AC
  Filled 2016-11-08: qty 50

## 2016-11-08 MED ORDER — NITROGLYCERIN 1 MG/10 ML FOR IR/CATH LAB
INTRA_ARTERIAL | Status: DC | PRN
  Administered 2016-11-08: 12:00:00

## 2016-11-08 MED ORDER — PREDNISONE 20 MG PO TABS
ORAL_TABLET | ORAL | Status: AC
  Administered 2016-11-08: 60 mg via ORAL
  Filled 2016-11-08: qty 3

## 2016-11-08 MED ORDER — DIPHENHYDRAMINE HCL 50 MG/ML IJ SOLN
INTRAMUSCULAR | Status: AC
  Administered 2016-11-08: 25 mg via INTRAVENOUS
  Filled 2016-11-08: qty 1

## 2016-11-08 MED ORDER — IOPAMIDOL (ISOVUE-370) INJECTION 76%
INTRAVENOUS | Status: AC
  Filled 2016-11-08: qty 100

## 2016-11-08 MED ORDER — DIPHENHYDRAMINE HCL 25 MG PO CAPS: 25.0000 mg | ORAL_CAPSULE | Freq: Once | ORAL | Status: DC

## 2016-11-08 MED ORDER — HEPARIN (PORCINE) IN NACL 2-0.9 UNIT/ML-% IJ SOLN
INTRAMUSCULAR | Status: AC
  Filled 2016-11-08: qty 1000

## 2016-11-08 MED ORDER — DIPHENHYDRAMINE HCL 25 MG PO CAPS
ORAL_CAPSULE | ORAL | Status: AC
  Filled 2016-11-08: qty 1

## 2016-11-08 MED ORDER — MIDAZOLAM HCL 2 MG/2ML IJ SOLN
INTRAMUSCULAR | Status: DC | PRN
  Administered 2016-11-08: 1 mg via INTRAVENOUS

## 2016-11-08 MED ORDER — PREDNISONE 20 MG PO TABS
60.0000 mg | ORAL_TABLET | Freq: Once | ORAL | Status: AC
  Administered 2016-11-08: 60 mg via ORAL

## 2016-11-08 MED ORDER — ACETAMINOPHEN 325 MG PO TABS
650.0000 mg | ORAL_TABLET | ORAL | Status: DC | PRN
  Administered 2016-11-08: 650 mg via ORAL

## 2016-11-08 MED ORDER — NITROGLYCERIN 1 MG/10 ML FOR IR/CATH LAB
INTRA_ARTERIAL | Status: AC
  Filled 2016-11-08: qty 10

## 2016-11-08 MED ORDER — SODIUM CHLORIDE 0.9% FLUSH: 3.0000 mL | Freq: Two times a day (BID) | INTRAVENOUS | Status: DC

## 2016-11-08 MED ORDER — FENTANYL CITRATE (PF) 100 MCG/2ML IJ SOLN
INTRAMUSCULAR | Status: AC
  Filled 2016-11-08: qty 2

## 2016-11-08 MED ORDER — LIDOCAINE HCL 1 % IJ SOLN
INTRAMUSCULAR | Status: AC
  Filled 2016-11-08: qty 20

## 2016-11-08 MED ORDER — SODIUM CHLORIDE 0.9 % IV SOLN: 250.0000 mL | INTRAVENOUS | Status: DC | PRN

## 2016-11-08 MED ORDER — HEPARIN (PORCINE) IN NACL 2-0.9 UNIT/ML-% IJ SOLN
INTRAMUSCULAR | Status: DC | PRN
  Administered 2016-11-08: 1000 mL

## 2016-11-08 SURGICAL SUPPLY — 9 items
CATH INFINITI 5FR MULTPACK ANG (CATHETERS) ×1 IMPLANT
KIT HEART LEFT (KITS) ×2 IMPLANT
PACK CARDIAC CATHETERIZATION (CUSTOM PROCEDURE TRAY) ×2 IMPLANT
SHEATH PINNACLE 5F 10CM (SHEATH) ×1 IMPLANT
SYR MEDRAD MARK V 150ML (SYRINGE) ×2 IMPLANT
TRANSDUCER W/STOPCOCK (MISCELLANEOUS) ×2 IMPLANT
TUBING CIL FLEX 10 FLL-RA (TUBING) ×2 IMPLANT
VALVE MANIFOLD 3 PORT W/RA/ON (MISCELLANEOUS) ×1 IMPLANT
WIRE EMERALD 3MM-J .035X150CM (WIRE) ×1 IMPLANT

## 2016-11-08 NOTE — Interval H&P Note (Signed)
History and Physical Interval Note:  11/08/2016 11:36 AM  [name]  has presented today for surgery, with the diagnosis of arotic stenosis - cp with accelerated hypertension possible unstable angina  The various methods of treatment have been discussed with the patient and family. After consideration of risks, benefits and other options for treatment, the patient has consented to  Procedure(s): Left Heart Cath and Coronary Angiography (N/A) with possible intervention as a surgical intervention .  The patient's history has been reviewed, patient examined, no change in status, stable for surgery.  I have reviewed the patient's chart and labs.  Questions were answered to the patient's satisfaction.    The patient suffered a fall with right wrist fracture and several ribs fractures back on [facility] May 3. Capsule to see orthopedic surgery this Friday. I tried to contact the surgeon who will be seeing her to have x-rays, but have not received a call back. The radiology read would suggest that is not a significant fracture with probably not require surgical repair.  Cath Lab Visit (complete for each Cath Lab visit)  Clinical Evaluation Leading to the Procedure:   ACS: No.  Non-ACS:    Anginal Classification: CCS III  Anti-ischemic medical therapy: Maximal Therapy (2 or more classes of medications)  Non-Invasive Test Results: No non-invasive testing performed  Prior CABG: No previous CABG   [name]

## 2016-11-08 NOTE — Discharge Instructions (Signed)
Angiogram, Care After °This sheet gives you information about how to care for yourself after your procedure. Your health care provider may also give you more specific instructions. If you have problems or questions, contact your health care provider. °What can I expect after the procedure? °After the procedure, it is common to have bruising and tenderness at the catheter insertion area. °Follow these instructions at home: °Insertion site care  °· Follow instructions from your health care provider about how to take care of your insertion site. Make sure you: °¨ Wash your hands with soap and water before you change your bandage (dressing). If soap and water are not available, use hand sanitizer. °¨ Change your dressing as told by your health care provider. °¨ Leave stitches (sutures), skin glue, or adhesive strips in place. These skin closures may need to stay in place for 2 weeks or longer. If adhesive strip edges start to loosen and curl up, you may trim the loose edges. Do not remove adhesive strips completely unless your health care provider tells you to do that. °· Do not take baths, swim, or use a hot tub until your health care provider approves. °· You may shower 24-48 hours after the procedure or as told by your health care provider. °¨ Gently wash the site with plain soap and water. °¨ Pat the area dry with a clean towel. °¨ Do not rub the site. This may cause bleeding. °· Do not apply powder or lotion to the site. Keep the site clean and dry. °· Check your insertion site every day for signs of infection. Check for: °¨ Redness, swelling, or pain. °¨ Fluid or blood. °¨ Warmth. °¨ Pus or a bad smell. °Activity  °· Rest as told by your health care provider, usually for 1-2 days. °· Do not lift anything that is heavier than 10 lbs. (4.5 kg) or as told by your health care provider. °· Do not drive for 24 hours if you were given a medicine to help you relax (sedative). °· Do not drive or use heavy machinery while  taking prescription pain medicine. °General instructions  °· Return to your normal activities as told by your health care provider, usually in about a week. Ask your health care provider what activities are safe for you. °· If the catheter site starts bleeding, lie flat and put pressure on the site. If the bleeding does not stop, get help right away. This is a medical emergency. °· Drink enough fluid to keep your urine clear or pale yellow. This helps flush the contrast dye from your body. °· Take over-the-counter and prescription medicines only as told by your health care provider. °· Keep all follow-up visits as told by your health care provider. This is important. °Contact a health care provider if: °· You have a fever or chills. °· You have redness, swelling, or pain around your insertion site. °· You have fluid or blood coming from your insertion site. °· The insertion site feels warm to the touch. °· You have pus or a bad smell coming from your insertion site. °· You have bruising around the insertion site. °· You notice blood collecting in the tissue around the catheter site (hematoma). The hematoma may be painful to the touch. °Get help right away if: °· You have severe pain at the catheter insertion area. °· The catheter insertion area swells very fast. °· The catheter insertion area is bleeding, and the bleeding does not stop when you hold steady pressure on   the area. °· The area near or just beyond the catheter insertion site becomes pale, cool, tingly, or numb. °These symptoms may represent a serious problem that is an emergency. Do not wait to see if the symptoms will go away. Get medical help right away. Call your local emergency services (911 in the [place] Do not drive yourself to the hospital. °Summary °· After the procedure, it is common to have bruising and tenderness at the catheter insertion area. °· After the procedure, it is important to rest and drink plenty of fluids. °· Do not take baths,  swim, or use a hot tub until your health care provider says it is okay to do so. You may shower 24-48 hours after the procedure or as told by your health care provider. °· If the catheter site starts bleeding, lie flat and put pressure on the site. If the bleeding does not stop, get help right away. This is a medical emergency. °This information is not intended to replace advice given to you by your health care provider. Make sure you discuss any questions you have with your health care provider. °Document Released: 01/05/2005 Document Revised: 05/24/2016 Document Reviewed: 05/24/2016 °Elsevier Interactive Patient Education © 2017 [employer] ° °

## 2016-11-08 NOTE — Progress Notes (Signed)
Site area: Right groin a 5 french arterial sheath was removed  Site Prior to Removal:  Level 0  Pressure Applied For 15 MINUTES    Bedrest Beginning at 1220p  Manual:   Yes.    Patient Status During Pull:  stable  Post Pull Groin Site:  Level 0  Post Pull Instructions Given:  Yes.    Post Pull Pulses Present:  Yes.    Dressing Applied:  Yes.    Comments:  VS remain stable during sheath pull.

## 2016-11-08 NOTE — H&P (View-Only) (Signed)
OFFICE NOTE  Chief Complaint:  Chest pain, uncontrolled hypertension  Primary Care Physician: Mauricio Po, [name]  HPI:  [name] is a pleasant 76 year old female kindly referred to me be Dr. Ardeth Perfect. She reports a past medical history significant for atrial fibrillation and congestive heart failure, which occurred around the same time and probably 2012. She vaguely recalls seeing Dr. Irish Lack at that time.  She was started on warfarin therapy and no attempts were made to get her back into rhythm as she was paroxysmal. She does has a long-standing history of hypertension and dyslipidemia. She apparently developed heart failure or however has never had reassessment of her ejection fraction. She subsequently was having health issues and decided to move with her son to live in [place]. She says at that time she had some problems with chest pain and ultimately underwent a stress test. This was associated with an anaphylactic type reaction and despite their reassurances she will "no longer have any more stress test". She says she has a severe allergy to IV contrast dye which causes throat swelling as well as milk which also causes the same symptoms. Apparently she also underwent cardiac catheterization however she says that only medical therapy was recommended. This was in [place], [place].  We are trying to request records from there as well. She subsequently moved with her son to [facility], however he lost his job and she recently moved back to [place]. Currently she denies any significant shortness of breath but does report some mild leg edema which is slightly worse and occasional pain in her left chest and left arm over the last several days. She's also had pain in the right chest. None of those symptoms are worse with exertion or relieved by rest or medication.   [name] returns today for followup. She again appears upset and quite nervous. Blood pressure recently has been  significantly elevated. In fact she went to the emergency room and was hospitalized for blood pressure control. Although it seems that her blood pressure more easily was controlled once her oral medicines were restarted. Subsequently she's been started on clonidine is now up to twice a day. Blood pressure today was still high at 371 systolic. She reports some occasional chest discomfort, mostly at rest and reports not being active enough to know if she has symptoms with exertion. She also has pain all over body significant low back pain and is seeing a specialist about that in the next few weeks.  I saw [name] back in the office today. She reports some improvement in her chest discomfort and I think this is related to blood pressure. The recent switch in her blood pressure medicines indicate a marked improvement in her pressures in she is noted to be 130/66 today. She does get some slight shortness of breath with exertion and no regular chest discomfort. At this time there is not clear evidence to pursue a further workup although I have a low threshold for further cardiac workup if she should become more symptomatic. She is in sinus rhythm and seems to be maintaining that.  [name] returns today for follow-up. She's had a remarkable past 6 months. Unfortunately she was diagnosed with a gastric cancer which was considered very high risk to resect. She was referred to [facility] for an endoscopic resection which was successful with clean margins. Prior to that she underwent cardiac evaluation by [facility] cardiology including a nuclear stress test which was low risk and  didn't involve exercise. She reports that during the last minute of exercise she went into A. fib. She's had recurrent A. fib on and off and in fact was just hospitalized again for A. fib a few weeks ago which converted spontaneously back to sinus rhythm, prior to cardiology is evaluating her. She was then directed to follow-up with me. She  has fortunately been on warfarin with therapeutic INRs. Of note, I did review her cardiac catheterization which was performed at an outside hospital 2014 which did demonstrate an 80% mid RCA stenosis as well as a 70% OM lesion and some mild to moderate disease of other vessels. At the time no stent was recommended, which leads me to believe these may be smaller vessels. The fact that her most recent stress test was again negative suggest that they are not flow limiting even though they would seem significant. Also, today she is describing left hand pain. She says that there is exquisite tenderness the left hand and it's difficult for her to extend her finger. She often notes that her left middle finger gets stuck. On palpation I do not notice any significant arthritis of the joints or nodules on the tendons.  04/19/2016  [name] returns today for follow-up. She is concerned about shortness of breath which is been progressive. She reports recently she's had some loss of vocal power and noted some upper airway wheezing. We and related with her on the office today and her oxygen saturations were noted to be 97% at rest and that came down to 95% with ambulation. EKG shows sinus rhythm at 66. Blood pressure is mildly elevated today. She has a nonproductive cough but denies fever, chills or sweats. Weight is up to 141 from 130 pounds 6 months ago.  07/11/2015  [name] returns today for follow-up. She seems to be doing fairly well. She denies any worsening shortness of breath or chest pain. She seems to be suffering from some acid reflux and will likely be undergoing another EGD with Dr. Benson Norway. She continues to have problems with voice changes which could be due to vocal cord inflammation related to her reflux.  09/21/2016  [name] is here today for follow-up. She had an INR check in the office. She is scheduled to have upcoming surgery for large hiatal hernia. Warm to [facility] by Dr. Precious Bard. Risk  factors for surgery including aortic stenosis, however this is recently assessed and is moderate. She denies any chest pain or anginal symptoms.  11/02/2016  [name] returns for follow-up today. Recently she's been having uncontrolled hypertension. 5 blood pressures been over 761 systolic. She's not had a history with difficult to control hypertension in the past. He's also had associated chest discomfort. The other day she had severe chest discomfort and she has had headaches. In 2013 she underwent cardia catheterization in [place] which showed some moderate coronary disease including what appears to be an 80% stenosis of the RCA however was not treated at the time. She also had proximal LAD disease which was mild to moderate. I'm concerned that she's had progression of her coronary artery disease as a possible cause of her uncontrolled hypertension. She reports the pain has occurred at rest but can occur with exertion. She also gets short of breath with exertion. She's felt more fatigue as well. The pain is described as sharp and nonradiating in the central chest. Unfortunate she's on warfarin for PAF and will need to hold that for 5 days prior  to the procedure to safely undergo cardiac catheterization. Although he recently cleared her for hernia repair, she never underwent the surgery as she was felt to be at higher risk. Given this new information I would not recommend hernia surgery in the near future until we have a better idea of her coronary arteries.  PMHx:  Past Medical History:  Diagnosis Date  . Anemia 2005   Generally microcytic, transfusions in [address], 2012, 02/2015, 05/2015  . Aortic stenosis    Moderate November 2017  . Arthritis   . Broken back 2013   Chronic back pain.   [name] CAD (coronary artery disease)    Cardiac catheterization 2014 - 80% mid RCA and 70% OM managed medically  . CHF (congestive heart failure) ([place])   . Contrast media allergy   . Diastolic heart failure ([place])     . Esophageal cancer ([place]) 05/06/2015   Adenocarcinoma GE junction  . GERD (gastroesophageal reflux disease)   . H/O iron deficiency    05-06-15 iron infusion ([facility])  . HH (hiatus hernia) 2008   Large with associated erosions  . Hypertension   . Paroxysmal atrial fibrillation ([name])   . PONV (postoperative nausea and vomiting)   . Pulmonary emboli ([place]) 2008, 2012  . Stroke ([place])   . Transfusion history    2 units transfused 05-06-15 ([facility])    Past Surgical History:  Procedure Laterality Date  . APPENDECTOMY  1950s  . BACK SURGERY    . CARPAL TUNNEL RELEASE Bilateral 1990s  . [place] SURGERY  2014  . CHOLECYSTECTOMY  1980s   open  . COLONOSCOPY N/A 02/17/2015   Procedure: COLONOSCOPY;  Surgeon: [name], MD;  Location: [facility] ENDOSCOPY;  Service: Endoscopy;  Laterality: N/A;  . ESOPHAGOGASTRODUODENOSCOPY N/A 02/16/2015   Procedure: ESOPHAGOGASTRODUODENOSCOPY (EGD);  Surgeon: [name], MD;  Location: [facility];  Service: Endoscopy;  Laterality: N/A;  . ESOPHAGOGASTRODUODENOSCOPY N/A 05/06/2015   Procedure: ESOPHAGOGASTRODUODENOSCOPY (EGD);  Surgeon: [name], MD;  Location: [facility] ENDOSCOPY;  Service: Endoscopy;  Laterality: N/A;  . EUS N/A 05/20/2015   Procedure: UPPER ENDOSCOPIC ULTRASOUND (EUS) LINEAR;  Surgeon: [name], MD;  Location: [facility] ENDOSCOPY;  Service: Endoscopy;  Laterality: N/A;  . exploratory lab  1950s or 1960s  . GIVENS CAPSULE STUDY N/A 05/06/2015   Procedure: GIVENS CAPSULE STUDY;  Surgeon: [name], MD;  Location: [facility] ENDOSCOPY;  Service: Endoscopy;  Laterality: N/A;  . lumbar back surgery  2012  . [place] SURGERY  1991  . TONSILLECTOMY    . TONSILLECTOMY AND ADENOIDECTOMY  1960s    FAMHx:  Family History  Problem Relation Age of Onset  . Stroke Mother   . Heart disease Mother   . Emphysema Father   . Ovarian cancer Sister   . Stroke Sister   . Other Child     died at birth    SOCHx:   reports that she has never  smoked. She has never used smokeless tobacco. She reports that she does not drink alcohol or use drugs.  ALLERGIES:  Allergies  Allergen Reactions  . Iodinated Diagnostic Agents Anaphylaxis    IPD dye Info given by patient  . Ioxaglate Anaphylaxis    Info given by patient  . Milk-Related Compounds Anaphylaxis    Lactose intolerance  . Red Dye Anaphylaxis  . Whey     Lactose intolerance  . Darvon [Propoxyphene] Rash  . Hydralazine Anxiety    Facial flushing, pt prefers not to use  it.     ROS: Pertinent items noted in HPI and remainder of comprehensive ROS otherwise negative.  HOME MEDS: Current Outpatient Prescriptions  Medication Sig Dispense Refill  . acetaminophen (TYLENOL) 500 MG tablet Take 500-1,000 mg by mouth daily as needed for mild pain or moderate pain.     [name] albuterol (PROVENTIL HFA;VENTOLIN HFA) 108 (90 Base) MCG/ACT inhaler Inhale 2 puffs into the lungs every 4 (four) hours as needed for wheezing or shortness of breath. 1 Inhaler 1  . budesonide-formoterol (SYMBICORT) 160-4.5 MCG/ACT inhaler Inhale 2 puffs into the lungs 2 (two) times daily. 1 Inhaler 0  . cyanocobalamin (,VITAMIN B-12,) 1000 MCG/ML injection IM daily 11/29-12/3, then weekly x4, then monthly. 9 mL 0  . diltiazem (CARDIZEM CD) 120 MG 24 hr capsule Take 1 capsule (120 mg total) by mouth daily. (Patient taking differently: Take 120 mg by mouth 2 (two) times daily at 10 AM and 5 PM. Take 2 times daily beginning 4/24.) 30 capsule 5  . ferrous sulfate 325 (65 FE) MG tablet Take 325 mg by mouth daily with breakfast.    . furosemide (LASIX) 40 MG tablet Take 1 tablet (40 mg total) by mouth daily. 30 tablet 5  . gabapentin (NEURONTIN) 100 MG capsule TAKE 1 CAPSULE BY MOUTH THREE TIMES A DAY. 270 capsule 0  . HYDROcodone-acetaminophen (NORCO/VICODIN) 5-325 MG tablet Take 1 tablet by mouth every 6 (six) hours as needed for moderate pain.    . metoprolol (LOPRESSOR) 50 MG tablet TAKE 1 TABLET BY MOUTH TWICE DAILY.  (Patient taking differently: Take 1 1/2 tablets twice daily) 180 tablet 0  . pantoprazole (PROTONIX) 40 MG tablet Take 1 tablet (40 mg total) by mouth 2 (two) times daily. 180 tablet 0  . Pediatric Multivitamins-Iron (FLINTSTONES PLUS IRON) chewable tablet Chew 2 tablets by mouth daily. 60 tablet 0  . simvastatin (ZOCOR) 20 MG tablet TAKE (1) TABLET BY MOUTH AT BEDTIME. 90 tablet 0  . temazepam (RESTORIL) 15 MG capsule TAKE 1 CAPSULE BY MOUTH AT BEDTIME. 30 capsule 2  . traMADol (ULTRAM) 50 MG tablet TAKE (1) TABLET BY MOUTH EVERY EIGHT HOURS. 90 tablet 0  . warfarin (COUMADIN) 5 MG tablet TAKE 1/2 TABLET BY MOUTH ON WEDNESDAY AND 1 TABLET ALL OTHER DAYS OR AS DIRECTED BY ANTICOAGULATION CLINIC. 90 tablet 1  . cloNIDine (CATAPRES - DOSED IN MG/24 HR) 0.1 mg/24hr patch Place 1 patch (0.1 mg total) onto the skin once a week. 4 patch 3   No current facility-administered medications for this visit.     LABS/IMAGING: Results for orders placed or performed in visit on 11/01/16 (from the past 48 hour(s))  POCT INR     Status: None   Collection Time: 11/01/16 12:00 AM  Result Value Ref Range   INR 3.5    No results found.  VITALS: BP (!) 170/68   Pulse 62   Ht 5\' 2"  (1.575 m)   Wt 133 lb (60.3 kg)   BMI 24.33 kg/m   EXAM: General appearance: alert and no distress Neck: no carotid bruit, no JVD and thyroid not enlarged, symmetric, no tenderness/mass/nodules Lungs: clear to auscultation bilaterally Heart: regular rate and rhythm, S1, S2 normal and systolic murmur: midsystolic 3/6, crescendo at 2nd right intercostal space Abdomen: mild TTP Extremities: varicose veins noted and no edema Pulses: 2+ and symmetric Skin: Skin color, texture, turgor normal. No rashes or lesions Neurologic: Grossly normal Psych: Mildly anxious  EKG: Deferred  ASSESSMENT: 1. Unstable angina 2. Uncontrolled hypertension  3. Paroxysmal atrial fibrillation on warfarin 4. History of diastolic congestive heart  failure - EF 60-65% on echo 5. Dyslipidemia 6. Moderate aortic stenosis - 05/2016 7. Right hand pain with contracture 8. Recent GI cancer/resected  PLAN: 1.   [name] presents with progressive chest discomfort which is concerning for unstable angina. She does have a history of some coronary artery disease by cardiac catheterization in 2013. She's also had recent uncontrolled hypertension which is new for her. In order to gain better control that I recommend adjusting her medications. We'll switch diltiazem which was recently increased over amlodipine 10 mg daily. We'll also change her metoprolol to carvedilol 12.5 mg twice a day. Start low-dose aspirin 81 mg daily. She'll need to hold warfarin for this 5 days prior to cardiac catheterization. I discussed the risks, benefits and alternatives chronic catheterization with her today and she is agreeable to proceed.  Follow-up with me afterwards.  Pixie Casino, MD, [facility] Attending Cardiologist [place] 11/02/2016, 10:25 AM

## 2016-11-09 ENCOUNTER — Encounter (HOSPITAL_COMMUNITY): Payer: Self-pay | Admitting: Cardiology

## 2016-11-13 DIAGNOSIS — S52571A Other intraarticular fracture of lower end of right radius, initial encounter for closed fracture: Secondary | ICD-10-CM | POA: Diagnosis not present

## 2016-11-14 ENCOUNTER — Telehealth: Payer: Self-pay | Admitting: Oncology

## 2016-11-14 NOTE — Telephone Encounter (Signed)
Confirmed r/s appt to 6/5

## 2016-11-16 ENCOUNTER — Encounter: Payer: Self-pay | Admitting: Internal Medicine

## 2016-11-16 ENCOUNTER — Ambulatory Visit (INDEPENDENT_AMBULATORY_CARE_PROVIDER_SITE_OTHER): Payer: Medicare Other | Admitting: Internal Medicine

## 2016-11-16 VITALS — BP 152/62 | HR 68 | Ht 62.0 in | Wt 131.2 lb

## 2016-11-16 DIAGNOSIS — R0602 Shortness of breath: Secondary | ICD-10-CM | POA: Diagnosis not present

## 2016-11-16 DIAGNOSIS — I10 Essential (primary) hypertension: Secondary | ICD-10-CM

## 2016-11-16 DIAGNOSIS — S52571D Other intraarticular fracture of lower end of right radius, subsequent encounter for closed fracture with routine healing: Secondary | ICD-10-CM | POA: Diagnosis not present

## 2016-11-16 DIAGNOSIS — Z4789 Encounter for other orthopedic aftercare: Secondary | ICD-10-CM | POA: Diagnosis not present

## 2016-11-16 DIAGNOSIS — I200 Unstable angina: Secondary | ICD-10-CM

## 2016-11-16 MED ORDER — CLONIDINE HCL 0.2 MG/24HR TD PTWK: 0.2000 mg | MEDICATED_PATCH | TRANSDERMAL | 5 refills | Status: DC

## 2016-11-16 NOTE — Progress Notes (Signed)
OFFICE NOTE  Chief Complaint:  Follow-up cath  Primary Care Physician: Golden Circle, [name]  HPI:  [name] is a pleasant 76 year old female kindly referred to me be Dr. Ardeth Perfect. She reports a past medical history significant for atrial fibrillation and congestive heart failure, which occurred around the same time and probably 2012. She vaguely recalls seeing Dr. Irish Lack at that time.  She was started on warfarin therapy and no attempts were made to get her back into rhythm as she was paroxysmal. She does has a long-standing history of hypertension and dyslipidemia. She apparently developed heart failure or however has never had reassessment of her ejection fraction. She subsequently was having health issues and decided to move with her son to live in [place]. She says at that time she had some problems with chest pain and ultimately underwent a stress test. This was associated with an anaphylactic type reaction and despite their reassurances she will "no longer have any more stress test". She says she has a severe allergy to IV contrast dye which causes throat swelling as well as milk which also causes the same symptoms. Apparently she also underwent cardiac catheterization however she says that only medical therapy was recommended. This was in [place], [place].  We are trying to request records from there as well. She subsequently moved with her son to [facility], however he lost his job and she recently moved back to [place]. Currently she denies any significant shortness of breath but does report some mild leg edema which is slightly worse and occasional pain in her left chest and left arm over the last several days. She's also had pain in the right chest. None of those symptoms are worse with exertion or relieved by rest or medication.   Mrs. Schrimpf returns today for followup. She again appears upset and quite nervous. Blood pressure recently has been significantly elevated. In  fact she went to the emergency room and was hospitalized for blood pressure control. Although it seems that her blood pressure more easily was controlled once her oral medicines were restarted. Subsequently she's been started on clonidine is now up to twice a day. Blood pressure today was still high at 854 systolic. She reports some occasional chest discomfort, mostly at rest and reports not being active enough to know if she has symptoms with exertion. She also has pain all over body significant low back pain and is seeing a specialist about that in the next few weeks.  I saw Ms. Hedman back in the office today. She reports some improvement in her chest discomfort and I think this is related to blood pressure. The recent switch in her blood pressure medicines indicate a marked improvement in her pressures in she is noted to be 130/66 today. She does get some slight shortness of breath with exertion and no regular chest discomfort. At this time there is not clear evidence to pursue a further workup although I have a low threshold for further cardiac workup if she should become more symptomatic. She is in sinus rhythm and seems to be maintaining that.  Mrs. Bibbins returns today for follow-up. She's had a remarkable past 6 months. Unfortunately she was diagnosed with a gastric cancer which was considered very high risk to resect. She was referred to [facility] for an endoscopic resection which was successful with clean margins. Prior to that she underwent cardiac evaluation by [facility] cardiology including a nuclear stress test which was low risk and didn't  involve exercise. She reports that during the last minute of exercise she went into A. fib. She's had recurrent A. fib on and off and in fact was just hospitalized again for A. fib a few weeks ago which converted spontaneously back to sinus rhythm, prior to cardiology is evaluating her. She was then directed to follow-up with me. She has fortunately been on  warfarin with therapeutic INRs. Of note, I did review her cardiac catheterization which was performed at an outside hospital 2014 which did demonstrate an 80% mid RCA stenosis as well as a 70% OM lesion and some mild to moderate disease of other vessels. At the time no stent was recommended, which leads me to believe these may be smaller vessels. The fact that her most recent stress test was again negative suggest that they are not flow limiting even though they would seem significant. Also, today she is describing left hand pain. She says that there is exquisite tenderness the left hand and it's difficult for her to extend her finger. She often notes that her left middle finger gets stuck. On palpation I do not notice any significant arthritis of the joints or nodules on the tendons.  04/19/2016  Mrs. Newmann returns today for follow-up. She is concerned about shortness of breath which is been progressive. She reports recently she's had some loss of vocal power and noted some upper airway wheezing. We and related with her on the office today and her oxygen saturations were noted to be 97% at rest and that came down to 95% with ambulation. EKG shows sinus rhythm at 66. Blood pressure is mildly elevated today. She has a nonproductive cough but denies fever, chills or sweats. Weight is up to 141 from 130 pounds 6 months ago.  07/11/2015  Mrs. Monterosso returns today for follow-up. She seems to be doing fairly well. She denies any worsening shortness of breath or chest pain. She seems to be suffering from some acid reflux and will likely be undergoing another EGD with Dr. Benson Norway. She continues to have problems with voice changes which could be due to vocal cord inflammation related to her reflux.  09/21/2016  Mrs. Molock is here today for follow-up. She had an INR check in the office. She is scheduled to have upcoming surgery for large hiatal hernia. Warm to [facility] by Dr. Precious Bard. Risk factors for surgery  including aortic stenosis, however this is recently assessed and is moderate. She denies any chest pain or anginal symptoms.  11/02/2016  Mrs. Weatherholtz returns for follow-up today. Recently she's been having uncontrolled hypertension. 5 blood pressures been over 299 systolic. She's not had a history with difficult to control hypertension in the past. He's also had associated chest discomfort. The other day she had severe chest discomfort and she has had headaches. In 2013 she underwent cardia catheterization in [place] which showed some moderate coronary disease including what appears to be an 80% stenosis of the RCA however was not treated at the time. She also had proximal LAD disease which was mild to moderate. I'm concerned that she's had progression of her coronary artery disease as a possible cause of her uncontrolled hypertension. She reports the pain has occurred at rest but can occur with exertion. She also gets short of breath with exertion. She's felt more fatigue as well. The pain is described as sharp and nonradiating in the central chest. Unfortunate she's on warfarin for PAF and will need to hold that for 5 days prior to  the procedure to safely undergo cardiac catheterization. Although he recently cleared her for hernia repair, she never underwent the surgery as she was felt to be at higher risk. Given this new information I would not recommend hernia surgery in the near future until we have a better idea of her coronary arteries.  11/16/2016  Mrs. Kowaleski returns for follow-up. She underwent heart catheterization by Dr. Ellyn Hack on 11/08/2016 which showed hyperdynamic LVEF greater than 65%, there was a moderate 60% ramus lesion and 60% RPDA lesion which was not thought to be significant. There was mild aortic valvular stenosis. Blood pressure was elevated. Medical therapy including increased blood pressure control was recommended. Since discharge she has done well. She denied any competitions from the  right femoral access site. She's had one or 2 brief episodes of chest discomfort. Unfortunately after I saw her in the office at the last visit she fell in the parking lot by the [employer] caf[employer]ia. Ultimately she fractured her right radial styloid. That is now casted and in a sling. She does not anticipate the need for surgery, but she would be considered acceptable risk for surgery. Blood pressure is improved however not yet at goal.  PMHx:  Past Medical History:  Diagnosis Date  . Anemia 2005   Generally microcytic, transfusions in [address], 2012, 02/2015, 05/2015  . Aortic stenosis    Moderate November 2017  . Arthritis   . Broken back 2013   Chronic back pain.   [name] CAD (coronary artery disease)    Cardiac catheterization 2014 - 80% mid RCA and 70% OM managed medically  . CHF (congestive heart failure) ([place])   . Contrast media allergy   . Diastolic heart failure ([place])   . Esophageal cancer ([place]) 05/06/2015   Adenocarcinoma GE junction  . GERD (gastroesophageal reflux disease)   . H/O iron deficiency    05-06-15 iron infusion ([facility])  . HH (hiatus hernia) 2008   Large with associated erosions  . Hypertension   . Paroxysmal atrial fibrillation ([name])   . PONV (postoperative nausea and vomiting)   . Pulmonary emboli ([place]) 2008, 2012  . Stroke ([place])   . Transfusion history    2 units transfused 05-06-15 ([facility])    Past Surgical History:  Procedure Laterality Date  . APPENDECTOMY  1950s  . BACK SURGERY    . CARPAL TUNNEL RELEASE Bilateral 1990s  . [place] SURGERY  2014  . CHOLECYSTECTOMY  1980s   open  . COLONOSCOPY N/A 02/17/2015   Procedure: COLONOSCOPY;  Surgeon: [name], MD;  Location: [facility] ENDOSCOPY;  Service: Endoscopy;  Laterality: N/A;  . ESOPHAGOGASTRODUODENOSCOPY N/A 02/16/2015   Procedure: ESOPHAGOGASTRODUODENOSCOPY (EGD);  Surgeon: [name], MD;  Location: [facility];  Service: Endoscopy;  Laterality: N/A;  . ESOPHAGOGASTRODUODENOSCOPY N/A 05/06/2015    Procedure: ESOPHAGOGASTRODUODENOSCOPY (EGD);  Surgeon: [name], MD;  Location: [facility] ENDOSCOPY;  Service: Endoscopy;  Laterality: N/A;  . EUS N/A 05/20/2015   Procedure: UPPER ENDOSCOPIC ULTRASOUND (EUS) LINEAR;  Surgeon: [name], MD;  Location: [facility] ENDOSCOPY;  Service: Endoscopy;  Laterality: N/A;  . exploratory lab  1950s or 1960s  . GIVENS CAPSULE STUDY N/A 05/06/2015   Procedure: GIVENS CAPSULE STUDY;  Surgeon: [name], MD;  Location: [facility] ENDOSCOPY;  Service: Endoscopy;  Laterality: N/A;  . LEFT HEART CATH AND CORONARY ANGIOGRAPHY N/A 11/08/2016   Procedure: Left Heart Cath and Coronary Angiography;  Surgeon: [name], MD;  Location: [facility];  Service: Cardiovascular;  Laterality: N/A;  . lumbar back surgery  2012  . [place] SURGERY  1991  . TONSILLECTOMY    . TONSILLECTOMY AND ADENOIDECTOMY  1960s    FAMHx:  Family History  Problem Relation Age of Onset  . Stroke Mother   . Heart disease Mother   . Emphysema Father   . Ovarian cancer Sister   . Stroke Sister   . Other Child        died at birth    SOCHx:   reports that she has never smoked. She has never used smokeless tobacco. She reports that she does not drink alcohol or use drugs.  ALLERGIES:  Allergies  Allergen Reactions  . Iodinated Diagnostic Agents Anaphylaxis and Other (See Comments)    IPD dye Info given by patient  . Ioxaglate Anaphylaxis and Other (See Comments)    Info given by patient  . Milk-Related Compounds Anaphylaxis and Other (See Comments)    Lactose intolerance Can tolerate milk if its cooked into the recipe, just can't drink milk   . Red Dye Anaphylaxis  . Whey Other (See Comments)    Lactose intolerance  . Darvon [Propoxyphene] Rash  . Hydralazine Anxiety and Other (See Comments)    Facial flushing, pt prefers not to use it.     ROS: Pertinent items noted in HPI and remainder of comprehensive ROS otherwise negative.  HOME MEDS: Current Outpatient  Prescriptions  Medication Sig Dispense Refill  . acetaminophen (TYLENOL) 500 MG tablet Take 500-1,000 mg by mouth daily as needed for mild pain or moderate pain.     [name] albuterol (PROVENTIL HFA;VENTOLIN HFA) 108 (90 Base) MCG/ACT inhaler Inhale 2 puffs into the lungs every 4 (four) hours as needed for wheezing or shortness of breath. 1 Inhaler 1  . amLODipine (NORVASC) 10 MG tablet Take 1 tablet (10 mg total) by mouth daily. 30 tablet 5  . aspirin EC 81 MG tablet Take 1 tablet (81 mg total) by mouth daily. 90 tablet 3  . budesonide-formoterol (SYMBICORT) 160-4.5 MCG/ACT inhaler Inhale 2 puffs into the lungs 2 (two) times daily. 1 Inhaler 0  . carvedilol (COREG) 12.5 MG tablet Take 1 tablet (12.5 mg total) by mouth 2 (two) times daily. 30 tablet 3  . cloNIDine (CATAPRES - DOSED IN MG/24 HR) 0.1 mg/24hr patch Place 1 patch (0.1 mg total) onto the skin once a week. 4 patch 3  . cyanocobalamin (,VITAMIN B-12,) 1000 MCG/ML injection IM daily 11/29-12/3, then weekly x4, then monthly. 9 mL 0  . famotidine (PEPCID) 20 MG tablet Take 1 tablet by mouth the morning of your procedure 1 tablet 0  . ferrous sulfate 325 (65 FE) MG tablet Take 325 mg by mouth 3 (three) times daily with meals.     . furosemide (LASIX) 40 MG tablet Take 1 tablet (40 mg total) by mouth daily. 30 tablet 5  . gabapentin (NEURONTIN) 100 MG capsule TAKE 1 CAPSULE BY MOUTH THREE TIMES A DAY. 270 capsule 0  . pantoprazole (PROTONIX) 40 MG tablet Take 1 tablet (40 mg total) by mouth 2 (two) times daily. 180 tablet 0  . Pediatric Multivitamins-Iron (FLINTSTONES PLUS IRON) chewable tablet Chew 2 tablets by mouth daily. 60 tablet 0  . simvastatin (ZOCOR) 20 MG tablet TAKE (1) TABLET BY MOUTH AT BEDTIME. 90 tablet 0  . temazepam (RESTORIL) 15 MG capsule TAKE 1 CAPSULE BY MOUTH AT BEDTIME. 30 capsule 2  . traMADol (ULTRAM) 50 MG tablet TAKE (1) TABLET BY  MOUTH EVERY EIGHT HOURS. 90 tablet 0  . warfarin (COUMADIN) 5 MG tablet TAKE 1/2 TABLET BY  MOUTH ON WEDNESDAY AND 1 TABLET ALL OTHER DAYS OR AS DIRECTED BY ANTICOAGULATION CLINIC. 90 tablet 1   No current facility-administered medications for this visit.     LABS/IMAGING: No results found for this or any previous visit (from the past 48 hour(s)). No results found.  VITALS: BP (!) 152/62   Pulse 68   Ht 5\' 2"  (1.575 m)   Wt 131 lb 3.2 oz (59.5 kg)   BMI 24.00 kg/m   EXAM: General appearance: alert and no distress Neck: no carotid bruit and no JVD Lungs: clear to auscultation bilaterally Heart: regular rate and rhythm, S1, S2 normal and systolic murmur: midsystolic 3/6, crescendo at 2nd right intercostal space Abdomen: soft, non-tender; bowel sounds normal; no masses,  no organomegaly Extremities: Right arm casted in a sling Pulses: 2+ and symmetric Skin: Skin color, texture, turgor normal. No rashes or lesions Neurologic: Grossly normal Psych: Pleasant  EKG: Deferred  ASSESSMENT: 1. Recent unstable angina - cath showed two-vessel moderate CAD and mild aortic stenosis (10/2016) 2. Uncontrolled hypertension 3. Paroxysmal atrial fibrillation on warfarin 4. History of diastolic congestive heart failure - EF 60-65% on echo 5. Dyslipidemia 6. Moderate aortic stenosis - 05/2016 7. Right hand pain with contracture 8. Recent GI cancer/resected  PLAN: 1.   Mrs. Tate fortunately had only moderate coronary disease on cath which did not necessarily explain her chest pain. However on the right circumstances with her uncontrolled hypertension she may be symptomatic. Blood pressure is now better control. I think she benefit from additional blood pressure control would recommend increasing her Catapres patch to 0.2 mg. She'll have a Coumadin clinic follow-up next week at which time her blood pressure could be checked. Follow-up with me then in 3 months.  Pixie Casino, MD, [facility] Attending Cardiologist [place] 11/16/2016, 11:25 AM

## 2016-11-16 NOTE — Patient Instructions (Signed)
Medication Instructions:  INCREASE YOU CATAPRES (CLONIDINE) TO 0.2 MG PATCH WEEKLY  Labwork: NONE  Testing/Procedures: NONE  Follow-Up: Your physician recommends that you schedule a follow-up appointment in: 3 MONTH OV  If you need a refill on your cardiac medications before your next appointment, please call your pharmacy.

## 2016-11-22 ENCOUNTER — Ambulatory Visit (INDEPENDENT_AMBULATORY_CARE_PROVIDER_SITE_OTHER): Payer: Medicare Other | Admitting: General Practice

## 2016-11-22 DIAGNOSIS — Z5181 Encounter for therapeutic drug level monitoring: Secondary | ICD-10-CM | POA: Diagnosis not present

## 2016-11-22 LAB — POCT INR: INR: 4.7

## 2016-11-22 NOTE — Progress Notes (Signed)
I have reviewed and agree with the plan. 

## 2016-11-22 NOTE — Patient Instructions (Signed)
Pre visit review using our clinic review tool, if applicable. No additional management support is needed unless otherwise documented below in the visit note. 

## 2016-11-24 DIAGNOSIS — Z4789 Encounter for other orthopedic aftercare: Secondary | ICD-10-CM | POA: Diagnosis not present

## 2016-11-24 DIAGNOSIS — S52571D Other intraarticular fracture of lower end of right radius, subsequent encounter for closed fracture with routine healing: Secondary | ICD-10-CM | POA: Diagnosis not present

## 2016-11-30 ENCOUNTER — Other Ambulatory Visit: Payer: Self-pay | Admitting: Family

## 2016-12-05 ENCOUNTER — Other Ambulatory Visit (HOSPITAL_BASED_OUTPATIENT_CLINIC_OR_DEPARTMENT_OTHER): Payer: Medicare Other

## 2016-12-05 ENCOUNTER — Ambulatory Visit (HOSPITAL_BASED_OUTPATIENT_CLINIC_OR_DEPARTMENT_OTHER): Payer: Medicare Other | Admitting: Oncology

## 2016-12-05 ENCOUNTER — Telehealth: Payer: Self-pay | Admitting: Oncology

## 2016-12-05 VITALS — BP 159/53 | HR 62 | Temp 97.8°F | Resp 18 | Ht 62.0 in | Wt 130.9 lb

## 2016-12-05 DIAGNOSIS — Z86711 Personal history of pulmonary embolism: Secondary | ICD-10-CM | POA: Diagnosis not present

## 2016-12-05 DIAGNOSIS — I480 Paroxysmal atrial fibrillation: Secondary | ICD-10-CM | POA: Diagnosis not present

## 2016-12-05 DIAGNOSIS — C160 Malignant neoplasm of cardia: Secondary | ICD-10-CM | POA: Diagnosis not present

## 2016-12-05 DIAGNOSIS — I10 Essential (primary) hypertension: Secondary | ICD-10-CM | POA: Diagnosis not present

## 2016-12-05 DIAGNOSIS — Z8673 Personal history of transient ischemic attack (TIA), and cerebral infarction without residual deficits: Secondary | ICD-10-CM | POA: Diagnosis not present

## 2016-12-05 DIAGNOSIS — D509 Iron deficiency anemia, unspecified: Secondary | ICD-10-CM

## 2016-12-05 LAB — CBC WITH DIFFERENTIAL/PLATELET
BASO%: 1 % (ref 0.0–2.0)
Basophils Absolute: 0 10*3/uL (ref 0.0–0.1)
EOS%: 4.6 % (ref 0.0–7.0)
Eosinophils Absolute: 0.2 10*3/uL (ref 0.0–0.5)
HEMATOCRIT: 39.1 % (ref 34.8–46.6)
HEMOGLOBIN: 12.9 g/dL (ref 11.6–15.9)
LYMPH%: 34.3 % (ref 14.0–49.7)
MCH: 27.2 pg (ref 25.1–34.0)
MCHC: 33 g/dL (ref 31.5–36.0)
MCV: 82.2 fL (ref 79.5–101.0)
MONO#: 0.4 10*3/uL (ref 0.1–0.9)
MONO%: 10 % (ref 0.0–14.0)
NEUT#: 2.2 10*3/uL (ref 1.5–6.5)
NEUT%: 50.1 % (ref 38.4–76.8)
Platelets: 130 10*3/uL — ABNORMAL LOW (ref 145–400)
RBC: 4.75 10*6/uL (ref 3.70–5.45)
RDW: 19.5 % — ABNORMAL HIGH (ref 11.2–14.5)
WBC: 4.4 10*3/uL (ref 3.9–10.3)
lymph#: 1.5 10*3/uL (ref 0.9–3.3)

## 2016-12-05 NOTE — Progress Notes (Signed)
  [place] OFFICE PROGRESS NOTE   Diagnosis: Esophagus cancer, iron deficiency anemia  INTERVAL HISTORY:   [name] returns as scheduled. She is taking iron. No dysphagia or reflux symptoms. She fell and fractured the right wrist on 11/02/2016. She now has a brace and is followed by orthopedics.  Objective:  Vital signs in last 24 hours:  Blood pressure (!) 159/53, pulse 62, temperature 97.8 F (36.6 C), temperature source Oral, resp. rate 18, height 5\' 2"  (1.575 m), weight 130 lb 14.4 oz (59.4 kg).    HEENT: Neck without mass Lymphatics: No cervical, supraclavicular, or axillary nodes Resp: Distant breath sounds, no respiratory distress Cardio: Regular rate and rhythm GI: No hepatosplenomegaly Vascular: No leg edema   Lab Results:  Lab Results  Component Value Date   WBC 4.4 12/05/2016   HGB 12.9 12/05/2016   HCT 39.1 12/05/2016   MCV 82.2 12/05/2016   PLT 130 (L) 12/05/2016   NEUTROABS 2.2 12/05/2016     Medications: I have reviewed the patient's current medications.  Assessment/Plan: 1. 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 Large hiatal hernia. Scar in the cardia. Normal examined duodenum.  CT scans abdomen/pelvis 10/10/2016-nonspecific masslike wall thickening of the stomach. Evaluated by GI medicine at [facility]. CT findings felt to most likely be related to scarring from previous procedure.  2. History of recurrent anemia requiring red cell transfusions, last transfusion November 2017  3. Paroxysmal atrial fibrillation  4. History of pulmonary embolism  5. Gastroesophageal reflux disease  6. Aortic stenosis  7. Hypertension  8. History of a CVA    Disposition:  [name] is in clinical remission  from the gastric cancer. The hemoglobin is normal. She will continue iron. [name] will return for a CBC in 2 months and an office visit in 4 months. She will contact us for symptoms of anemia.  15 minutes were spent with the patient today. The majority of the time was used for counseling and coordination of care.  Donneta Romberg, MD  12/05/2016  10:07 AM

## 2016-12-05 NOTE — Telephone Encounter (Signed)
Gave patient AVS and calender per 6/5 LOS.  

## 2016-12-11 DIAGNOSIS — Z4789 Encounter for other orthopedic aftercare: Secondary | ICD-10-CM | POA: Diagnosis not present

## 2016-12-11 DIAGNOSIS — S52571D Other intraarticular fracture of lower end of right radius, subsequent encounter for closed fracture with routine healing: Secondary | ICD-10-CM | POA: Diagnosis not present

## 2016-12-11 DIAGNOSIS — M1811 Unilateral primary osteoarthritis of first carpometacarpal joint, right hand: Secondary | ICD-10-CM | POA: Diagnosis not present

## 2016-12-12 ENCOUNTER — Other Ambulatory Visit: Payer: Self-pay | Admitting: Family

## 2016-12-13 ENCOUNTER — Ambulatory Visit (INDEPENDENT_AMBULATORY_CARE_PROVIDER_SITE_OTHER): Payer: Medicare Other | Admitting: General Practice

## 2016-12-13 DIAGNOSIS — I4891 Unspecified atrial fibrillation: Secondary | ICD-10-CM

## 2016-12-13 DIAGNOSIS — Z5181 Encounter for therapeutic drug level monitoring: Secondary | ICD-10-CM | POA: Diagnosis not present

## 2016-12-13 LAB — POCT INR: INR: 2.4

## 2016-12-13 NOTE — Patient Instructions (Signed)
Pre visit review using our clinic review tool, if applicable. No additional management support is needed unless otherwise documented below in the visit note. 

## 2016-12-13 NOTE — Progress Notes (Signed)
I have reviewed and agree with the plan. 

## 2016-12-27 ENCOUNTER — Other Ambulatory Visit: Payer: Self-pay | Admitting: Family

## 2016-12-27 ENCOUNTER — Other Ambulatory Visit: Payer: Self-pay | Admitting: Internal Medicine

## 2016-12-27 NOTE — Telephone Encounter (Signed)
Last refill was 07/13/16 per [facility]

## 2017-01-09 ENCOUNTER — Telehealth: Payer: Self-pay | Admitting: Family

## 2017-01-09 ENCOUNTER — Ambulatory Visit (INDEPENDENT_AMBULATORY_CARE_PROVIDER_SITE_OTHER): Payer: Medicare Other | Admitting: General Practice

## 2017-01-09 ENCOUNTER — Other Ambulatory Visit (INDEPENDENT_AMBULATORY_CARE_PROVIDER_SITE_OTHER): Payer: Medicare Other

## 2017-01-09 ENCOUNTER — Encounter: Payer: Self-pay | Admitting: Family

## 2017-01-09 ENCOUNTER — Ambulatory Visit (INDEPENDENT_AMBULATORY_CARE_PROVIDER_SITE_OTHER): Payer: Medicare Other | Admitting: Family

## 2017-01-09 VITALS — BP 148/58 | HR 53 | Temp 97.8°F | Resp 16 | Ht 62.0 in | Wt 124.0 lb

## 2017-01-09 DIAGNOSIS — Z5181 Encounter for therapeutic drug level monitoring: Secondary | ICD-10-CM

## 2017-01-09 DIAGNOSIS — E538 Deficiency of other specified B group vitamins: Secondary | ICD-10-CM

## 2017-01-09 DIAGNOSIS — R200 Anesthesia of skin: Secondary | ICD-10-CM

## 2017-01-09 DIAGNOSIS — I200 Unstable angina: Secondary | ICD-10-CM

## 2017-01-09 LAB — COMPREHENSIVE METABOLIC PANEL
ALBUMIN: 4 g/dL (ref 3.5–5.2)
ALK PHOS: 83 U/L (ref 39–117)
ALT: 13 U/L (ref 0–35)
AST: 13 U/L (ref 0–37)
BILIRUBIN TOTAL: 0.4 mg/dL (ref 0.2–1.2)
BUN: 13 mg/dL (ref 6–23)
CO2: 31 mEq/L (ref 19–32)
CREATININE: 0.93 mg/dL (ref 0.40–1.20)
Calcium: 9 mg/dL (ref 8.4–10.5)
Chloride: 100 mEq/L (ref 96–112)
GFR: 62.07 mL/min (ref >60.00)
GLUCOSE: 143 mg/dL — AB (ref 70–99)
POTASSIUM: 3.9 meq/L (ref 3.5–5.1)
SODIUM: 142 meq/L (ref 135–145)
TOTAL PROTEIN: 6.5 g/dL (ref 6.0–8.3)

## 2017-01-09 LAB — PROTIME-INR
INR: 2.7 ratio — AB (ref 0.8–1.0)
Prothrombin Time: 29.2 s — ABNORMAL HIGH (ref 9.6–13.1)

## 2017-01-09 LAB — POCT INR: INR: 2.7

## 2017-01-09 MED ORDER — CYANOCOBALAMIN 1000 MCG/ML IJ SOLN
1000.0000 ug | Freq: Once | INTRAMUSCULAR | Status: AC
  Administered 2017-01-09: 1000 ug via INTRAMUSCULAR

## 2017-01-09 NOTE — Patient Instructions (Addendum)
Thank you for choosing [facility].  SUMMARY AND INSTRUCTIONS:  Please continue with the clonidine patch.  If no improvement we may consider stopping clonidine.  They will call to schedule your appointment for a CT scan and neurology.  Labs:  Please stop by the lab on the lower level of the building for your blood work. Your results will be released to [facility] (or called to you) after review, usually within 72 hours after test completion. If any changes need to be made, you will be notified at that same time.  1.) The lab is open from 7:30am to 5:30 pm Monday-Friday 2.) No appointment is necessary 3.) Fasting (if needed) is 6-8 hours after food and drink; black coffee and water are okay   Follow up:  If your symptoms worsen or fail to improve, please contact our office for further instruction, or in case of emergency go directly to the emergency room at the closest medical facility.

## 2017-01-09 NOTE — Progress Notes (Signed)
I have reviewed and agree with the plan. 

## 2017-01-09 NOTE — Progress Notes (Signed)
Subjective:    Patient ID: [name], female    DOB: [birth year], 76 y.o.   MRN: [id number]  Chief Complaint  Patient presents with  . Numbness    right side of face has had numbness as well as her throat, back of head has been going numb, x3 days, feels like something is over right eye     HPI:  [name] is a 76 y.o. female who  has a past medical history of Anemia (2005); Aortic stenosis; Arthritis; Broken back (2013); CAD (coronary artery disease); CHF (congestive heart failure) ([place]); Contrast media allergy; Diastolic heart failure ([place]); Esophageal cancer ([place]) (05/06/2015); GERD (gastroesophageal reflux disease); H/O iron deficiency; HH (hiatus hernia) (2008); Hypertension; Paroxysmal atrial fibrillation ([place]); PONV (postoperative nausea and vomiting); Pulmonary emboli ([place]) (2008, 2012); Stroke [facility]); and Transfusion history. and presents today for an office visit.  This is a new problem. Associated symptoms of numbness located on the right side of her face, throat, and back of her head has been going on for about 3 days. Describes that she feels like something is over her right eye. Notes that her balance is slightly off as well. Describes a sensation on the top of her head that is numb as well. Maintained on coumadin for anticoagulation. No dysphasia, but endorses xerostomia  Allergies  Allergen Reactions  . Iodinated Diagnostic Agents Anaphylaxis and Other (See Comments)    IPD dye Info given by patient  . Ioxaglate Anaphylaxis and Other (See Comments)    Info given by patient  . Milk-Related Compounds Anaphylaxis and Other (See Comments)    Lactose intolerance Can tolerate milk if its cooked into the recipe, just can't drink milk   . Red Dye Anaphylaxis  . Whey Other (See Comments)    Lactose intolerance  . Darvon [Propoxyphene] Rash  . Hydralazine Anxiety and Other (See Comments)    Facial flushing, pt prefers not to use it.       Outpatient Medications  Prior to Visit  Medication Sig Dispense Refill  . acetaminophen (TYLENOL) 500 MG tablet Take 500-1,000 mg by mouth daily as needed for mild pain or moderate pain.     [name] albuterol (PROVENTIL HFA;VENTOLIN HFA) 108 (90 Base) MCG/ACT inhaler Inhale 2 puffs into the lungs every 4 (four) hours as needed for wheezing or shortness of breath. 1 Inhaler 1  . amLODipine (NORVASC) 10 MG tablet Take 1 tablet (10 mg total) by mouth daily. 30 tablet 5  . aspirin EC 81 MG tablet Take 1 tablet (81 mg total) by mouth daily. 90 tablet 3  . budesonide-formoterol (SYMBICORT) 160-4.5 MCG/ACT inhaler Inhale 2 puffs into the lungs 2 (two) times daily. 1 Inhaler 0  . carvedilol (COREG) 12.5 MG tablet TAKE (1) TABLET BY MOUTH TWICE DAILY. 180 tablet 3  . cloNIDine (CATAPRES - DOSED IN MG/24 HR) 0.2 mg/24hr patch Place 1 patch (0.2 mg total) onto the skin once a week. 4 patch 5  . cyanocobalamin (,VITAMIN B-12,) 1000 MCG/ML injection IM daily 11/29-12/3, then weekly x4, then monthly. 9 mL 0  . ferrous sulfate 325 (65 FE) MG tablet Take 325 mg by mouth 3 (three) times daily with meals.     . furosemide (LASIX) 40 MG tablet Take 1 tablet (40 mg total) by mouth daily. 30 tablet 5  . gabapentin (NEURONTIN) 100 MG capsule TAKE 1 CAPSULE BY MOUTH THREE TIMES A DAY. 270 capsule 0  . pantoprazole (PROTONIX) 40 MG tablet TAKE 1 TABLET BY  MOUTH TWICE DAILY. 180 tablet 0  . Pediatric Multivitamins-Iron (FLINTSTONES PLUS IRON) chewable tablet Chew 2 tablets by mouth daily. 60 tablet 0  . simvastatin (ZOCOR) 20 MG tablet TAKE (1) TABLET BY MOUTH AT BEDTIME. 90 tablet 0  . temazepam (RESTORIL) 15 MG capsule TAKE 1 CAPSULE BY MOUTH AT BEDTIME. 30 capsule 2  . traMADol (ULTRAM) 50 MG tablet TAKE ONE TABLET BY MOUTH EVERY 8 HOURS. 90 tablet 0  . warfarin (COUMADIN) 5 MG tablet TAKE 1/2 TABLET BY MOUTH ON WEDNESDAY AND 1 TABLET ALL OTHER DAYS OR AS DIRECTED BY ANTICOAGULATION CLINIC. (Patient taking differently: TAKE 1/2 TABLET BY MOUTH ON  [facility] AND Saturday AND 1 TABLET ALL OTHER DAYS OR AS DIRECTED BY ANTICOAGULATION CLINIC.) 90 tablet 1   No facility-administered medications prior to visit.       Past Surgical History:  Procedure Laterality Date  . APPENDECTOMY  1950s  . BACK SURGERY    . CARPAL TUNNEL RELEASE Bilateral 1990s  . [place] SURGERY  2014  . CHOLECYSTECTOMY  1980s   open  . COLONOSCOPY N/A 02/17/2015   Procedure: COLONOSCOPY;  Surgeon: [name], MD;  Location: [facility] ENDOSCOPY;  Service: Endoscopy;  Laterality: N/A;  . ESOPHAGOGASTRODUODENOSCOPY N/A 02/16/2015   Procedure: ESOPHAGOGASTRODUODENOSCOPY (EGD);  Surgeon: [name], MD;  Location: [facility];  Service: Endoscopy;  Laterality: N/A;  . ESOPHAGOGASTRODUODENOSCOPY N/A 05/06/2015   Procedure: ESOPHAGOGASTRODUODENOSCOPY (EGD);  Surgeon: [name], MD;  Location: [facility] ENDOSCOPY;  Service: Endoscopy;  Laterality: N/A;  . EUS N/A 05/20/2015   Procedure: UPPER ENDOSCOPIC ULTRASOUND (EUS) LINEAR;  Surgeon: [name], MD;  Location: [facility] ENDOSCOPY;  Service: Endoscopy;  Laterality: N/A;  . exploratory lab  1950s or 1960s  . GIVENS CAPSULE STUDY N/A 05/06/2015   Procedure: GIVENS CAPSULE STUDY;  Surgeon: [name], MD;  Location: [facility] ENDOSCOPY;  Service: Endoscopy;  Laterality: N/A;  . LEFT HEART CATH AND CORONARY ANGIOGRAPHY N/A 11/08/2016   Procedure: Left Heart Cath and Coronary Angiography;  Surgeon: [name], MD;  Location: [facility] LAB;  Service: Cardiovascular;  Laterality: N/A;  . lumbar back surgery  2012  . [place] SURGERY  1991  . TONSILLECTOMY    . TONSILLECTOMY AND ADENOIDECTOMY  1960s      Past Medical History:  Diagnosis Date  . Anemia 2005   Generally microcytic, transfusions in [address], 2012, 02/2015, 05/2015  . Aortic stenosis    Moderate November 2017  . Arthritis   . Broken back 2013   Chronic back pain.   [name] CAD (coronary artery disease)    Cardiac catheterization 2014 - 80% mid RCA and  70% OM managed medically  . CHF (congestive heart failure) ([place])   . Contrast media allergy   . Diastolic heart failure ([place])   . Esophageal cancer ([place]) 05/06/2015   Adenocarcinoma GE junction  . GERD (gastroesophageal reflux disease)   . H/O iron deficiency    05-06-15 iron infusion ([facility])  . HH (hiatus hernia) 2008   Large with associated erosions  . Hypertension   . Paroxysmal atrial fibrillation ([name])   . PONV (postoperative nausea and vomiting)   . Pulmonary emboli ([place]) 2008, 2012  . Stroke ([place])   . Transfusion history    2 units transfused 05-06-15 ([facility])      Review of Systems  Constitutional: Negative for chills and fever.  Respiratory: Negative for chest tightness and shortness of breath.   Cardiovascular: Negative for chest pain,  palpitations and leg swelling.  Neurological: Positive for numbness. Negative for weakness.      Objective:    BP (!) 148/58 (BP Location: Left Arm, Patient Position: Sitting, Cuff Size: Normal)   Pulse (!) 53   Temp 97.8 F (36.6 C) (Oral)   Resp 16   Ht _0  (1.575 m)   Wt 124 lb (56.2 kg)   SpO2 97%   BMI 22.68 kg/m  Nursing note and vital signs reviewed.  Physical Exam  Constitutional: She is oriented to person, place, and time. She appears well-developed and well-nourished. No distress.  HENT:  Right Ear: Hearing, tympanic membrane, external ear and ear canal normal.  Left Ear: Hearing, tympanic membrane, external ear and ear canal normal.  Nose: Nose normal. Right sinus exhibits no maxillary sinus tenderness and no frontal sinus tenderness. Left sinus exhibits no maxillary sinus tenderness and no frontal sinus tenderness.  Mouth/Throat: Uvula is midline, oropharynx is clear and moist and mucous membranes are normal.  Cardiovascular: Normal rate, regular rhythm, normal heart sounds and intact distal pulses.   Pulmonary/Chest: Effort normal and breath sounds normal.  Neurological: She is alert and oriented to person, place,  and time. She has normal strength. No cranial nerve deficit or sensory deficit. She displays a negative Romberg sign.  Facial expressions are symmetrical; Trigeminal nerve sensation intact equally to touch.   Skin: Skin is warm and dry.  Psychiatric: She has a normal mood and affect. Her behavior is normal. Judgment and thought content normal.       Assessment & Plan:   Problem List Items Addressed This Visit      Other   B12 deficiency   Relevant Medications   cyanocobalamin ((VITAMIN B-12)) injection 1,000 mcg (Completed)   Numbness of face - Primary    New-onset numbness on the right side of face with no significant findings on neurological exam with little concern for Bell's palsy or trigeminal neuropathy for neuralgia. Obtain CT scan given patient anticoagulation with warfarin. Unlikely related to clonidine patch she is on a very low dosage weekly. Continue to monitor pending CT scan refer to neurology for further assessment and treatment.      Relevant Orders   Comp Met (CMET)   CT Head Wo Contrast    Other Visit Diagnoses    Therapeutic drug monitoring       Relevant Orders   INR/PT       I am having Ms. Harden maintain her acetaminophen, cyanocobalamin, FLINTSTONES PLUS IRON, albuterol, budesonide-formoterol, warfarin, furosemide, ferrous sulfate, temazepam, aspirin EC, amLODipine, cloNIDine, simvastatin, pantoprazole, gabapentin, carvedilol, and traMADol. We administered cyanocobalamin.   Meds ordered this encounter  Medications  . cyanocobalamin ((VITAMIN B-12)) injection 1,000 mcg     Follow-up: Return if symptoms worsen or fail to improve.  Mauricio Po, [name]

## 2017-01-09 NOTE — Telephone Encounter (Signed)
Patient Name: [name] DOB: May 02, 1941 Initial Comment Caller states she has dizziness and numbness on the right side of her face. Nurse Assessment Nurse: Ronnald Ramp, RN, Miranda Date/Time (Eastern Time): 01/09/2017 8:15:45 AM Confirm and document reason for call. If symptomatic, describe symptoms. ---Caller states she started having dizziness and numbness in the right side of her face. She is concerned this could be related to Clonidine patch (which was started in May). Does the patient have any new or worsening symptoms? ---Yes Will a triage be completed? ---No [facility] reason for no triage. ---Patient declined Please document clinical information provided and list any resource used. ---Pt because frustrated when RN tried to get information about her history. She states she just wants to make an appt and does not want to go through the process and hung up. Guidelines Guideline Title Affirmed Question Affirmed Notes Final Disposition User Clinical Call Ronnald Ramp, RN, [employer]

## 2017-01-09 NOTE — Assessment & Plan Note (Signed)
New-onset numbness on the right side of face with no significant findings on neurological exam with little concern for Bell's palsy or trigeminal neuropathy for neuralgia. Obtain CT scan given patient anticoagulation with warfarin. Unlikely related to clonidine patch she is on a very low dosage weekly. Continue to monitor pending CT scan refer to neurology for further assessment and treatment.

## 2017-01-09 NOTE — Patient Instructions (Signed)
Pre visit review using our clinic review tool, if applicable. No additional management support is needed unless otherwise documented below in the visit note. 

## 2017-01-09 NOTE — Telephone Encounter (Signed)
Contacted pt and scheduled her to see Marya Amsler this am.

## 2017-01-10 ENCOUNTER — Ambulatory Visit: Payer: Medicare Other

## 2017-01-12 ENCOUNTER — Ambulatory Visit
Admission: RE | Admit: 2017-01-12 | Discharge: 2017-01-12 | Disposition: A | Discharge status: Home / Self Care | Payer: Medicare Other | Source: Ambulatory Visit | Attending: Family | Admitting: Family

## 2017-01-12 DIAGNOSIS — R200 Anesthesia of skin: Secondary | ICD-10-CM | POA: Diagnosis not present

## 2017-01-14 ENCOUNTER — Encounter: Payer: Self-pay | Admitting: Family

## 2017-01-26 ENCOUNTER — Other Ambulatory Visit: Payer: Self-pay

## 2017-02-01 ENCOUNTER — Other Ambulatory Visit: Payer: Self-pay | Admitting: Family

## 2017-02-01 ENCOUNTER — Telehealth: Payer: Self-pay

## 2017-02-01 DIAGNOSIS — J209 Acute bronchitis, unspecified: Secondary | ICD-10-CM

## 2017-02-01 MED ORDER — TEMAZEPAM 15 MG PO CAPS: 15.0000 mg | ORAL_CAPSULE | Freq: Every day | ORAL | 2 refills | Status: DC

## 2017-02-01 NOTE — Telephone Encounter (Signed)
Received fax from pharmacy requesting temazepam. Last refill was 12/27/2016 per [place] [facility]

## 2017-02-01 NOTE — Telephone Encounter (Signed)
Medication printed to be faxed.  

## 2017-02-02 NOTE — Telephone Encounter (Signed)
.  faxed script back to [facility]...[name]

## 2017-02-05 ENCOUNTER — Other Ambulatory Visit (HOSPITAL_BASED_OUTPATIENT_CLINIC_OR_DEPARTMENT_OTHER): Payer: Medicare Other

## 2017-02-05 DIAGNOSIS — C160 Malignant neoplasm of cardia: Secondary | ICD-10-CM

## 2017-02-05 DIAGNOSIS — D509 Iron deficiency anemia, unspecified: Secondary | ICD-10-CM

## 2017-02-05 LAB — CBC WITH DIFFERENTIAL/PLATELET
BASO%: 0.2 % (ref 0.0–2.0)
BASOS ABS: 0 10*3/uL (ref 0.0–0.1)
EOS%: 3.1 % (ref 0.0–7.0)
Eosinophils Absolute: 0.1 10*3/uL (ref 0.0–0.5)
HEMATOCRIT: 38.2 % (ref 34.8–46.6)
HEMOGLOBIN: 13.1 g/dL (ref 11.6–15.9)
LYMPH#: 1.2 10*3/uL (ref 0.9–3.3)
LYMPH%: 28.8 % (ref 14.0–49.7)
MCH: 29.6 pg (ref 25.1–34.0)
MCHC: 34.3 g/dL (ref 31.5–36.0)
MCV: 86.2 fL (ref 79.5–101.0)
MONO#: 0.3 10*3/uL (ref 0.1–0.9)
MONO%: 7.4 % (ref 0.0–14.0)
NEUT#: 2.5 10*3/uL (ref 1.5–6.5)
NEUT%: 60.5 % (ref 38.4–76.8)
Platelets: 140 10*3/uL — ABNORMAL LOW (ref 145–400)
RBC: 4.43 10*6/uL (ref 3.70–5.45)
RDW: 13.1 % (ref 11.2–14.5)
WBC: 4.2 10*3/uL (ref 3.9–10.3)

## 2017-02-05 LAB — FERRITIN: FERRITIN: 105 ng/mL (ref 9–269)

## 2017-02-07 ENCOUNTER — Ambulatory Visit: Payer: Medicare Other

## 2017-02-19 ENCOUNTER — Ambulatory Visit (INDEPENDENT_AMBULATORY_CARE_PROVIDER_SITE_OTHER): Payer: Medicare Other | Admitting: Internal Medicine

## 2017-02-19 ENCOUNTER — Encounter: Payer: Self-pay | Admitting: Internal Medicine

## 2017-02-19 VITALS — BP 160/52 | HR 61 | Ht 62.0 in | Wt 130.0 lb

## 2017-02-19 DIAGNOSIS — R5383 Other fatigue: Secondary | ICD-10-CM | POA: Diagnosis not present

## 2017-02-19 DIAGNOSIS — I200 Unstable angina: Secondary | ICD-10-CM | POA: Diagnosis not present

## 2017-02-19 DIAGNOSIS — I480 Paroxysmal atrial fibrillation: Secondary | ICD-10-CM

## 2017-02-19 DIAGNOSIS — I350 Nonrheumatic aortic (valve) stenosis: Secondary | ICD-10-CM | POA: Diagnosis not present

## 2017-02-19 NOTE — Patient Instructions (Addendum)
Your physician wants you to follow-up in: 6 months with Dr. Debara Pickett (after echo) You will receive a reminder letter in the mail two months in advance. If you don't receive a letter, please call our office to schedule the follow-up appointment.  Your physician has requested that you have an echocardiogram @ [address] - [address]. Echocardiography is a painless test that uses sound waves to create images of your heart. It provides your doctor with information about the size and shape of your heart and how well your heart's chambers and valves are working. This procedure takes approximately one hour. There are no restrictions for this procedure.

## 2017-02-19 NOTE — Progress Notes (Signed)
OFFICE NOTE  Chief Complaint:  Follow-up cath  Primary Care Physician: Golden Circle, [name]  HPI:  [name] is a pleasant 76 year old female kindly referred to me be Dr. Ardeth Perfect. She reports a past medical history significant for atrial fibrillation and congestive heart failure, which occurred around the same time and probably 2012. She vaguely recalls seeing Dr. Irish Lack at that time.  She was started on warfarin therapy and no attempts were made to get her back into rhythm as she was paroxysmal. She does has a long-standing history of hypertension and dyslipidemia. She apparently developed heart failure or however has never had reassessment of her ejection fraction. She subsequently was having health issues and decided to move with her son to live in [place]. She says at that time she had some problems with chest pain and ultimately underwent a stress test. This was associated with an anaphylactic type reaction and despite their reassurances she will "no longer have any more stress test". She says she has a severe allergy to IV contrast dye which causes throat swelling as well as milk which also causes the same symptoms. Apparently she also underwent cardiac catheterization however she says that only medical therapy was recommended. This was in [place], [place].  We are trying to request records from there as well. She subsequently moved with her son to [facility], however he lost his job and she recently moved back to [place]. Currently she denies any significant shortness of breath but does report some mild leg edema which is slightly worse and occasional pain in her left chest and left arm over the last several days. She's also had pain in the right chest. None of those symptoms are worse with exertion or relieved by rest or medication.   [name] returns today for followup. She again appears upset and quite nervous. Blood pressure recently has been significantly elevated. In  fact she went to the emergency room and was hospitalized for blood pressure control. Although it seems that her blood pressure more easily was controlled once her oral medicines were restarted. Subsequently she's been started on clonidine is now up to twice a day. Blood pressure today was still high at 322 systolic. She reports some occasional chest discomfort, mostly at rest and reports not being active enough to know if she has symptoms with exertion. She also has pain all over body significant low back pain and is seeing a specialist about that in the next few weeks.  I saw [name] back in the office today. She reports some improvement in her chest discomfort and I think this is related to blood pressure. The recent switch in her blood pressure medicines indicate a marked improvement in her pressures in she is noted to be 130/66 today. She does get some slight shortness of breath with exertion and no regular chest discomfort. At this time there is not clear evidence to pursue a further workup although I have a low threshold for further cardiac workup if she should become more symptomatic. She is in sinus rhythm and seems to be maintaining that.  [name] returns today for follow-up. She's had a remarkable past 6 months. Unfortunately she was diagnosed with a gastric cancer which was considered very high risk to resect. She was referred to [facility] for an endoscopic resection which was successful with clean margins. Prior to that she underwent cardiac evaluation by [facility] cardiology including a nuclear stress test which was low risk and didn't  involve exercise. She reports that during the last minute of exercise she went into A. fib. She's had recurrent A. fib on and off and in fact was just hospitalized again for A. fib a few weeks ago which converted spontaneously back to sinus rhythm, prior to cardiology is evaluating her. She was then directed to follow-up with me. She has fortunately been on  warfarin with therapeutic INRs. Of note, I did review her cardiac catheterization which was performed at an outside hospital 2014 which did demonstrate an 80% mid RCA stenosis as well as a 70% OM lesion and some mild to moderate disease of other vessels. At the time no stent was recommended, which leads me to believe these may be smaller vessels. The fact that her most recent stress test was again negative suggest that they are not flow limiting even though they would seem significant. Also, today she is describing left hand pain. She says that there is exquisite tenderness the left hand and it's difficult for her to extend her finger. She often notes that her left middle finger gets stuck. On palpation I do not notice any significant arthritis of the joints or nodules on the tendons.  04/19/2016  [name] returns today for follow-up. She is concerned about shortness of breath which is been progressive. She reports recently she's had some loss of vocal power and noted some upper airway wheezing. We and related with her on the office today and her oxygen saturations were noted to be 97% at rest and that came down to 95% with ambulation. EKG shows sinus rhythm at 66. Blood pressure is mildly elevated today. She has a nonproductive cough but denies fever, chills or sweats. Weight is up to 141 from 130 pounds 6 months ago.  07/11/2015  [name] returns today for follow-up. She seems to be doing fairly well. She denies any worsening shortness of breath or chest pain. She seems to be suffering from some acid reflux and will likely be undergoing another EGD with Dr. Benson [place]. She continues to have problems with voice changes which could be due to vocal cord inflammation related to her reflux.  09/21/2016  [name] is here today for follow-up. She had an INR check in the office. She is scheduled to have upcoming surgery for large hiatal hernia. Warm to [facility] by Dr. Precious Bard. Risk factors for surgery  including aortic stenosis, however this is recently assessed and is moderate. She denies any chest pain or anginal symptoms.  11/02/2016  [name] returns for follow-up today. Recently she's been having uncontrolled hypertension. 5 blood pressures been over 976 systolic. She's not had a history with difficult to control hypertension in the past. He's also had associated chest discomfort. The other day she had severe chest discomfort and she has had headaches. In 2013 she underwent cardia catheterization in [place] which showed some moderate coronary disease including what appears to be an 80% stenosis of the RCA however was not treated at the time. She also had proximal LAD disease which was mild to moderate. I'm concerned that she's had progression of her coronary artery disease as a possible cause of her uncontrolled hypertension. She reports the pain has occurred at rest but can occur with exertion. She also gets short of breath with exertion. She's felt more fatigue as well. The pain is described as sharp and nonradiating in the central chest. Unfortunate she's on warfarin for PAF and will need to hold that for 5 days prior to  the procedure to safely undergo cardiac catheterization. Although he recently cleared her for hernia repair, she never underwent the surgery as she was felt to be at higher risk. Given this new information I would not recommend hernia surgery in the near future until we have a better idea of her coronary arteries.  11/16/2016  [name] returns for follow-up. She underwent heart catheterization by Dr. Ellyn Hack on 11/08/2016 which showed hyperdynamic LVEF greater than 65%, there was a moderate 60% ramus lesion and 60% RPDA lesion which was not thought to be significant. There was mild aortic valvular stenosis. Blood pressure was elevated. Medical therapy including increased blood pressure control was recommended. Since discharge she has done well. She denied any competitions from the  right femoral access site. She's had one or 2 brief episodes of chest discomfort. Unfortunately after I saw her in the office at the last visit she fell in the parking lot by the [employer] caf[employer]ia. Ultimately she fractured her right radial styloid. That is now casted and in a sling. She does not anticipate the need for surgery, but she would be considered acceptable risk for surgery. Blood pressure is improved however not yet at goal.  02/19/2017  [name] is seen today in follow-up. She is doing well other than painful osteoarthritis. Her blood pressures been better controlled although she is in need of refills. She was found to have moderate aortic stenosis by last echo however was only noted to be mild by cardiac catheterization. She'll need a repeat echo in about 6 months.  PMHx:  Past Medical History:  Diagnosis Date  . Anemia 2005   Generally microcytic, transfusions in [address], 2012, 02/2015, 05/2015  . Aortic stenosis    Moderate November 2017  . Arthritis   . Broken back 2013   Chronic back pain.   [name] CAD (coronary artery disease)    Cardiac catheterization 2014 - 80% mid RCA and 70% OM managed medically  . CHF (congestive heart failure) ([facility])   . Contrast media allergy   . Diastolic heart failure ([place])   . Esophageal cancer ([place]) 05/06/2015   Adenocarcinoma GE junction  . GERD (gastroesophageal reflux disease)   . H/O iron deficiency    05-06-15 iron infusion ([facility])  . HH (hiatus hernia) 2008   Large with associated erosions  . Hypertension   . Paroxysmal atrial fibrillation ([name])   . PONV (postoperative nausea and vomiting)   . Pulmonary emboli ([place]) 2008, 2012  . Stroke ([place])   . Transfusion history    2 units transfused 05-06-15 ([facility])    Past Surgical History:  Procedure Laterality Date  . APPENDECTOMY  1950s  . BACK SURGERY    . CARPAL TUNNEL RELEASE Bilateral 1990s  . [place] SURGERY  2014  . CHOLECYSTECTOMY  1980s   open  . COLONOSCOPY N/A 02/17/2015    Procedure: COLONOSCOPY;  Surgeon: [name], MD;  Location: [facility] ENDOSCOPY;  Service: Endoscopy;  Laterality: N/A;  . ESOPHAGOGASTRODUODENOSCOPY N/A 02/16/2015   Procedure: ESOPHAGOGASTRODUODENOSCOPY (EGD);  Surgeon: [name], MD;  Location: [facility] ENDOSCOPY;  Service: Endoscopy;  Laterality: N/A;  . ESOPHAGOGASTRODUODENOSCOPY N/A 05/06/2015   Procedure: ESOPHAGOGASTRODUODENOSCOPY (EGD);  Surgeon: [name], MD;  Location: [facility] ENDOSCOPY;  Service: Endoscopy;  Laterality: N/A;  . EUS N/A 05/20/2015   Procedure: UPPER ENDOSCOPIC ULTRASOUND (EUS) LINEAR;  Surgeon: [name], MD;  Location: [facility] ENDOSCOPY;  Service: Endoscopy;  Laterality: N/A;  . exploratory lab  1950s or 1960s  .  GIVENS CAPSULE STUDY N/A 05/06/2015   Procedure: GIVENS CAPSULE STUDY;  Surgeon: [name], MD;  Location: [facility] ENDOSCOPY;  Service: Endoscopy;  Laterality: N/A;  . LEFT HEART CATH AND CORONARY ANGIOGRAPHY N/A 11/08/2016   Procedure: Left Heart Cath and Coronary Angiography;  Surgeon: [name], MD;  Location: [place] CV LAB;  Service: Cardiovascular;  Laterality: N/A;  . lumbar back surgery  2012  . [place] SURGERY  1991  . TONSILLECTOMY    . TONSILLECTOMY AND ADENOIDECTOMY  1960s    FAMHx:  Family History  Problem Relation Age of Onset  . Stroke Mother   . Heart disease Mother   . Emphysema Father   . Ovarian cancer Sister   . Stroke Sister   . Other Child        died at birth    SOCHx:   reports that she has never smoked. She has never used smokeless tobacco. She reports that she does not drink alcohol or use drugs.  ALLERGIES:  Allergies  Allergen Reactions  . Iodinated Diagnostic Agents Anaphylaxis and Other (See Comments)    IPD dye Info given by patient  . Ioxaglate Anaphylaxis and Other (See Comments)    Info given by patient  . Milk-Related Compounds Anaphylaxis and Other (See Comments)    Lactose intolerance Can tolerate milk if its cooked into the recipe, just can't  drink milk   . Red Dye Anaphylaxis  . Whey Other (See Comments)    Lactose intolerance  . Darvon [Propoxyphene] Rash  . Hydralazine Anxiety and Other (See Comments)    Facial flushing, pt prefers not to use it.     ROS: Pertinent items noted in HPI and remainder of comprehensive ROS otherwise negative.  HOME MEDS: Current Outpatient Prescriptions  Medication Sig Dispense Refill  . acetaminophen (TYLENOL) 500 MG tablet Take 500-1,000 mg by mouth daily as needed for mild pain or moderate pain.     [name] albuterol (PROVENTIL HFA;VENTOLIN HFA) 108 (90 Base) MCG/ACT inhaler Inhale 2 puffs into the lungs every 4 (four) hours as needed for wheezing or shortness of breath. 1 Inhaler 1  . amLODipine (NORVASC) 10 MG tablet Take 1 tablet (10 mg total) by mouth daily. 30 tablet 5  . aspirin EC 81 MG tablet Take 1 tablet (81 mg total) by mouth daily. 90 tablet 3  . budesonide-formoterol (SYMBICORT) 160-4.5 MCG/ACT inhaler Inhale 2 puffs into the lungs 2 (two) times daily. 1 Inhaler 0  . carvedilol (COREG) 12.5 MG tablet TAKE (1) TABLET BY MOUTH TWICE DAILY. 180 tablet 3  . cloNIDine (CATAPRES - DOSED IN MG/24 HR) 0.2 mg/24hr patch Place 1 patch (0.2 mg total) onto the skin once a week. 4 patch 5  . cyanocobalamin (,VITAMIN B-12,) 1000 MCG/ML injection IM daily 11/29-12/3, then weekly x4, then monthly. 9 mL 0  . ferrous sulfate 325 (65 FE) MG tablet Take 325 mg by mouth 3 (three) times daily with meals.     . furosemide (LASIX) 40 MG tablet Take 1 tablet (40 mg total) by mouth daily. 30 tablet 5  . gabapentin (NEURONTIN) 100 MG capsule TAKE 1 CAPSULE BY MOUTH THREE TIMES A DAY. 270 capsule 0  . pantoprazole (PROTONIX) 40 MG tablet TAKE 1 TABLET BY MOUTH TWICE DAILY. 180 tablet 0  . Pediatric Multivitamins-Iron (FLINTSTONES PLUS IRON) chewable tablet Chew 2 tablets by mouth daily. 60 tablet 0  . simvastatin (ZOCOR) 20 MG tablet TAKE (1) TABLET BY MOUTH AT BEDTIME. 90 tablet  0  . temazepam (RESTORIL) 15 MG  capsule Take 1 capsule (15 mg total) by mouth at bedtime. 30 capsule 2  . traMADol (ULTRAM) 50 MG tablet TAKE ONE TABLET BY MOUTH EVERY 8 HOURS. 90 tablet 0  . warfarin (COUMADIN) 5 MG tablet TAKE 1/2 TABLET BY MOUTH ON WEDNESDAY AND 1 TABLET ALL OTHER DAYS OR AS DIRECTED BY ANTICOAGULATION CLINIC. (Patient taking differently: TAKE 1/2 TABLET BY MOUTH ON [facility] AND Saturday AND 1 TABLET ALL OTHER DAYS OR AS DIRECTED BY ANTICOAGULATION CLINIC.) 90 tablet 1   No current facility-administered medications for this visit.     LABS/IMAGING: No results found for this or any previous visit (from the past 48 hour(s)). No results found.  VITALS: BP (!) 160/52   Pulse 61   Ht 5\' 2"  (1.575 m)   Wt 130 lb (59 kg)   BMI 23.78 kg/m   EXAM: General appearance: alert and no distress Neck: no carotid bruit and no JVD Lungs: clear to auscultation bilaterally Heart: regular rate and rhythm, S1, S2 normal and systolic murmur: midsystolic 3/6, crescendo at 2nd right intercostal space Abdomen: soft, non-tender; bowel sounds normal; no masses,  no organomegaly Extremities: normal Pulses: 2+ and symmetric Skin: Skin color, texture, turgor normal. No rashes or lesions Neurologic: Grossly normal Psych: Pleasant  EKG: Normal sinus rhythm at 61  ASSESSMENT: 1. Recent unstable angina - cath showed two-vessel moderate CAD and mild aortic stenosis (10/2016) 2. Uncontrolled hypertension 3. Paroxysmal atrial fibrillation on warfarin 4. History of diastolic congestive heart failure - EF 60-65% on echo 5. Dyslipidemia 6. Moderate aortic stenosis - 05/2016 7. Right hand pain with contracture 8. Recent GI cancer/resected  PLAN: 1.   [name] says she's feeling better than some osteoarthritis. She denies any chest pain or worsening shortness of breath. She feels that her blood pressures are better controlled on the higher dose of clonidine. We'll continue with her current medications. Plan a repeat echo  in 6 months and follow-up with me at that time.  Pixie Casino, MD, [facility] Attending Cardiologist [place] 02/19/2017, 10:06 AM

## 2017-02-21 NOTE — Progress Notes (Addendum)
Subjective:   [name] is a 76 y.o. female who presents for an Initial Medicare Annual Wellness Visit.  Review of Systems  No ROS.  Medicare Wellness Visit. Additional risk factors are reflected in the social history.  Cardiac Risk Factors include: advanced age (>11men, >19 women);hypertension;dyslipidemia Sleep patterns: feels rested on waking, gets up 1 times nightly to void and sleeps 7-8 hours nightly.    Home Safety/Smoke Alarms: Feels safe in home. Smoke alarms in place.  Living environment; residence and Firearm Safety: 1-story house/ trailer, equipment: Radio producer, Type: Single Point [place] and Walkers, Type: Conservation officer, nature, no firearms. Lives with son, no needs for DME, good support system  Seat Belt Safety/Bike Helmet: Wears seat belt.    Objective:    Today's Vitals   02/22/17 1014 02/22/17 1018  BP: 124/62   Pulse: 77   Resp: 20   SpO2: 98%   Weight: 128 lb (58.1 kg)   Height: 5\' 2"  (1.575 m)   PainSc:  3    Body mass index is 23.41 kg/m.  Current Medications (verified) Outpatient Encounter Prescriptions as of 02/22/2017  Medication Sig  . acetaminophen (TYLENOL) 500 MG tablet Take 500-1,000 mg by mouth daily as needed for mild pain or moderate pain.   [name] albuterol (PROVENTIL HFA;VENTOLIN HFA) 108 (90 Base) MCG/ACT inhaler Inhale 2 puffs into the lungs every 4 (four) hours as needed for wheezing or shortness of breath.  [name] amLODipine (NORVASC) 10 MG tablet Take 1 tablet (10 mg total) by mouth daily.  [name] aspirin EC 81 MG tablet Take 1 tablet (81 mg total) by mouth daily.  . budesonide-formoterol (SYMBICORT) 160-4.5 MCG/ACT inhaler Inhale 2 puffs into the lungs 2 (two) times daily.  . carvedilol (COREG) 12.5 MG tablet TAKE (1) TABLET BY MOUTH TWICE DAILY.  . cloNIDine (CATAPRES - DOSED IN MG/24 HR) 0.2 mg/24hr patch Place 1 patch (0.2 mg total) onto the skin once a week.  . cyanocobalamin (,VITAMIN B-12,) 1000 MCG/ML injection IM daily 11/29-12/3, then weekly x4, then  monthly.  . ferrous sulfate 325 (65 FE) MG tablet Take 325 mg by mouth 3 (three) times daily with meals.   . furosemide (LASIX) 40 MG tablet Take 1 tablet (40 mg total) by mouth daily.  [name] gabapentin (NEURONTIN) 100 MG capsule TAKE 1 CAPSULE BY MOUTH THREE TIMES A DAY.  . pantoprazole (PROTONIX) 40 MG tablet TAKE 1 TABLET BY MOUTH TWICE DAILY.  [name] Pediatric Multivitamins-Iron (FLINTSTONES PLUS IRON) chewable tablet Chew 2 tablets by mouth daily.  . simvastatin (ZOCOR) 20 MG tablet TAKE (1) TABLET BY MOUTH AT BEDTIME.  . temazepam (RESTORIL) 15 MG capsule Take 1 capsule (15 mg total) by mouth at bedtime.  . traMADol (ULTRAM) 50 MG tablet TAKE ONE TABLET BY MOUTH EVERY 8 HOURS.  [name] warfarin (COUMADIN) 5 MG tablet TAKE 1/2 TABLET BY MOUTH ON WEDNESDAY AND 1 TABLET ALL OTHER DAYS OR AS DIRECTED BY ANTICOAGULATION CLINIC. (Patient taking differently: TAKE 1/2 TABLET BY MOUTH ON [facility] AND Saturday AND 1 TABLET ALL OTHER DAYS OR AS DIRECTED BY ANTICOAGULATION CLINIC.)   No facility-administered encounter medications on file as of 02/22/2017.     Allergies (verified) Iodinated diagnostic agents; Ioxaglate; Milk-related compounds; Red dye; Whey; Darvon [propoxyphene]; and Hydralazine   History: Past Medical History:  Diagnosis Date  . Anemia 2005   Generally microcytic, transfusions in [address], 2012, 02/2015, 05/2015  . Aortic stenosis    Moderate November 2017  . Arthritis   . Broken back 2013  Chronic back pain.   [name] CAD (coronary artery disease)    Cardiac catheterization 2014 - 80% mid RCA and 70% OM managed medically  . CHF (congestive heart failure) ([place])   . Contrast media allergy   . Diastolic heart failure ([place])   . Esophageal cancer ([place]) 05/06/2015   Adenocarcinoma GE junction  . GERD (gastroesophageal reflux disease)   . H/O iron deficiency    05-06-15 iron infusion ([facility])  . HH (hiatus hernia) 2008   Large with associated erosions  . Hypertension   . Paroxysmal atrial  fibrillation ([name])   . PONV (postoperative nausea and vomiting)   . Pulmonary emboli ([place]) 2008, 2012  . Stroke ([place])   . Transfusion history    2 units transfused 05-06-15 ([facility])   Past Surgical History:  Procedure Laterality Date  . APPENDECTOMY  1950s  . BACK SURGERY    . CARPAL TUNNEL RELEASE Bilateral 1990s  . [place] SURGERY  2014  . CHOLECYSTECTOMY  1980s   open  . COLONOSCOPY N/A 02/17/2015   Procedure: COLONOSCOPY;  Surgeon: [name], MD;  Location: [facility] ENDOSCOPY;  Service: Endoscopy;  Laterality: N/A;  . ESOPHAGOGASTRODUODENOSCOPY N/A 02/16/2015   Procedure: ESOPHAGOGASTRODUODENOSCOPY (EGD);  Surgeon: [name], MD;  Location: [facility];  Service: Endoscopy;  Laterality: N/A;  . ESOPHAGOGASTRODUODENOSCOPY N/A 05/06/2015   Procedure: ESOPHAGOGASTRODUODENOSCOPY (EGD);  Surgeon: [name], MD;  Location: [facility] ENDOSCOPY;  Service: Endoscopy;  Laterality: N/A;  . EUS N/A 05/20/2015   Procedure: UPPER ENDOSCOPIC ULTRASOUND (EUS) LINEAR;  Surgeon: [name], MD;  Location: [facility] ENDOSCOPY;  Service: Endoscopy;  Laterality: N/A;  . exploratory lab  1950s or 1960s  . GIVENS CAPSULE STUDY N/A 05/06/2015   Procedure: GIVENS CAPSULE STUDY;  Surgeon: [name], MD;  Location: [facility] ENDOSCOPY;  Service: Endoscopy;  Laterality: N/A;  . LEFT HEART CATH AND CORONARY ANGIOGRAPHY N/A 11/08/2016   Procedure: Left Heart Cath and Coronary Angiography;  Surgeon: [name], MD;  Location: [facility] CV LAB;  Service: Cardiovascular;  Laterality: N/A;  . lumbar back surgery  2012  . [place] SURGERY  1991  . TONSILLECTOMY    . TONSILLECTOMY AND ADENOIDECTOMY  1960s   Family History  Problem Relation Age of Onset  . Stroke Mother   . Heart disease Mother   . Emphysema Father   . Ovarian cancer Sister   . Stroke Sister   . Other Child        died at birth   Social History   Occupational History  . Retired    Social History Main Topics  . Smoking status:  Never Smoker  . Smokeless tobacco: Never Used  . Alcohol use No  . Drug use: No  . Sexual activity: Not Currently   Tobacco Counseling Counseling given: Not Answered   Activities of Daily Living In your present state of health, do you have any difficulty performing the following activities: 02/22/2017 11/08/2016  Hearing? N N  Vision? N N  Difficulty concentrating or making decisions? N N  Walking or climbing stairs? N Y  Dressing or bathing? N Y  Doing errands, shopping? N -  Preparing Food and eating ? N -  Using the Toilet? N -  In the past six months, have you accidently leaked urine? N -  Do you have problems with loss of bowel control? N -  Managing your Medications? N -  Managing your Finances? N -  Housekeeping or managing your  Housekeeping? Y -  Some recent data might be hidden    Immunizations and Health Maintenance  There is no immunization history on file for this patient. Health Maintenance Due  Topic Date Due  . TETANUS/TDAP  08/26/1958  . DEXA SCAN  08/26/2004  . PNA vac Low Risk Adult (1 of 2 - PCV13) 08/26/2004  . INFLUENZA VACCINE  01/31/2017    Patient Care Team: Golden Circle, [name] as PCP - General (Family Medicine) Kyung Rudd, MD as Consulting Physician (Radiation Oncology) Grace Isaac, MD as Consulting Physician (Cardiothoracic Surgery) Ladell Pier, MD as Consulting Physician (Oncology) Stark Klein, MD as Consulting Physician (General Surgery)  Indicate any recent Medical Services you may have received from other than [facility] providers in the past year (date may be approximate).    Assessment:   This is a routine wellness examination for Makynna. Physical assessment deferred to PCP.   Hearing/Vision screen  Visual Acuity Screening   Right eye Left eye Both eyes  Without correction:   20/50  With correction:     Comments: Wears glasses, last appointment 2 years ago, goes for eye exam as she can afford at [facility]  Hearing  Screening Comments: Able to hear conversational tones w/o difficulty. No issues reported.  Passed whisper test  Dietary issues and exercise activities discussed: Current Exercise Habits: The patient does not participate in regular exercise at present (chair exercise pamphlet provided), Exercise limited by: orthopedic condition(s) Diet (meal preparation, eat out, water intake, caffeinated beverages, dairy products, fruits and vegetables): in general, a "healthy" diet  , well balanced,   Reviewed heart healthy and diabetic diet, encouraged patient to increase daily water intake. Diet education was attached to patient's AVS. Relevant patient education assigned to patient using Emmi.  Goals    . Worship God and do God's praises      Depression Screen PHQ 2/9 Scores 02/22/2017  PHQ - 2 Score 0  PHQ- 9 Score 0    Fall Risk Fall Risk  02/22/2017 05/12/2015  Falls in the past year? Yes No  Number falls in past yr: 1 -  Injury with Fall? Yes -  Follow up Falls prevention discussed;Education provided -    Cognitive Function: MMSE - Mini Mental State Exam 02/22/2017  Orientation to time 5  Orientation to Place 5  Registration 3  Attention/ Calculation 5  Recall 2  Language- name 2 objects 2  Language- repeat 1  Language- follow 3 step command 3  Language- read & follow direction 1  Write a sentence 1  Copy design 1  Total score 29        Screening Tests Health Maintenance  Topic Date Due  . TETANUS/TDAP  08/26/1958  . DEXA SCAN  08/26/2004  . PNA vac Low Risk Adult (1 of 2 - PCV13) 08/26/2004  . INFLUENZA VACCINE  01/31/2017        Plan:    Continue to eat heart healthy diet (full of fruits, vegetables, whole grains, lean protein, water--limit salt, fat, and sugar intake) and increase physical activity as tolerated.  Continue doing brain stimulating activities (puzzles, reading, adult coloring books, staying active) to keep memory sharp.   Patient declined Bone Density  referral, pneumonia vaccine and flu vaccine today, education was provided.  I have personally reviewed and noted the following in the patient's chart:   . Medical and social history . Use of alcohol, tobacco or illicit drugs  . Current medications and supplements . Functional ability  and status . Nutritional status . Physical activity . Advanced directives . List of other physicians . Vitals . Screenings to include cognitive, depression, and falls . Referrals and appointments  In addition, I have reviewed and discussed with patient certain preventive protocols, quality metrics, and best practice recommendations. A written personalized care plan for preventive services as well as general preventive health recommendations were provided to patient.     Michiel Cowboy, RN   02/22/2017      Medical screening examination/treatment/procedure(s) were performed by non-physician practitioner and as supervising physician I was immediately available for consultation/collaboration. I agree with above. Binnie Rail, MD

## 2017-02-21 NOTE — Progress Notes (Signed)
Pre visit review using our clinic review tool, if applicable. No additional management support is needed unless otherwise documented below in the visit note. 

## 2017-02-22 ENCOUNTER — Ambulatory Visit (INDEPENDENT_AMBULATORY_CARE_PROVIDER_SITE_OTHER): Payer: Medicare Other | Admitting: *Deleted

## 2017-02-22 VITALS — BP 124/62 | HR 77 | Resp 20 | Ht 62.0 in | Wt 128.0 lb

## 2017-02-22 DIAGNOSIS — Z0000 Encounter for general adult medical examination without abnormal findings: Secondary | ICD-10-CM | POA: Diagnosis not present

## 2017-02-22 NOTE — Patient Instructions (Addendum)
Continue to eat heart healthy diet (full of fruits, vegetables, whole grains, lean protein, water--limit salt, fat, and sugar intake) and increase physical activity as tolerated.  Continue doing brain stimulating activities (puzzles, reading, adult coloring books, staying active) to keep memory sharp.    [name] , Thank you for taking time to come for your Medicare Wellness Visit. I appreciate your ongoing commitment to your health goals. Please review the following plan we discussed and let me know if I can assist you in the future.   These are the goals we discussed: Goals    . Worship God and do God's praises       This is a list of the screening recommended for you and due dates:  Health Maintenance  Topic Date Due  . Tetanus Vaccine  08/26/1958  . DEXA scan (bone density measurement)  08/26/2004  . Pneumonia vaccines (1 of 2 - PCV13) 08/26/2004  . Flu Shot  01/31/2017    Food Choices for Gastroesophageal Reflux Disease, Adult When you have gastroesophageal reflux disease (GERD), the foods you eat and your eating habits are very important. Choosing the right foods can help ease your discomfort. What guidelines do I need to follow?  Choose fruits, vegetables, whole grains, and low-fat dairy products.  Choose low-fat meat, fish, and poultry.  Limit fats such as oils, salad dressings, butter, nuts, and avocado.  Keep a food diary. This helps you identify foods that cause symptoms.  Avoid foods that cause symptoms. These may be different for everyone.  Eat small meals often instead of 3 large meals a day.  Eat your meals slowly, in a place where you are relaxed.  Limit fried foods.  Cook foods using methods other than frying.  Avoid drinking alcohol.  Avoid drinking large amounts of liquids with your meals.  Avoid bending over or lying down until 2-3 hours after eating. What foods are not recommended? These are some foods and drinks that may make your symptoms  worse: Vegetables Tomatoes. Tomato juice. Tomato and spaghetti sauce. Chili peppers. Onion and garlic. Horseradish. Fruits Oranges, grapefruit, and lemon (fruit and juice). Meats High-fat meats, fish, and poultry. This includes hot dogs, ribs, ham, sausage, salami, and bacon. Dairy Whole milk and chocolate milk. Sour cream. Cream. Butter. Ice cream. Cream cheese. Drinks Coffee and tea. Bubbly (carbonated) drinks or energy drinks. Condiments Hot sauce. Barbecue sauce. Sweets/Desserts Chocolate and cocoa. Donuts. Peppermint and spearmint. Fats and Oils High-fat foods. This includes [place] fries and potato chips. Other Vinegar. Strong spices. This includes black pepper, white pepper, red pepper, cayenne, curry powder, cloves, ginger, and chili powder. The items listed above may not be a complete list of foods and drinks to avoid. Contact your dietitian for more information. This information is not intended to replace advice given to you by your health care provider. Make sure you discuss any questions you have with your health care provider. Document Released: 12/19/2011 Document Revised: 11/25/2015 Document Reviewed: 04/23/2013 Elsevier Interactive Patient Education  [employer].    It is important to avoid accidents which may result in broken bones.  Here are a few ideas on how to make your home safer so you will be less likely to trip or fall.  1. Use nonskid mats or non slip strips in your shower or tub, on your bathroom floor and around sinks.  If you know that you have spilled water, wipe it up! 2. In the bathroom, it is important to have properly installed  grab bars on the walls or on the edge of the tub.  Towel racks are NOT strong enough for you to hold onto or to pull on for support. 3. Stairs and hallways should have enough light.  Add lamps or night lights if you need ore light. 4. It is good to have handrails on both sides of the stairs if possible.  Always fix broken  handrails right away. 5. It is important to see the edges of steps.  Paint the edges of outdoor steps white so you can see them better.  Put colored tape on the edge of inside steps. 6. Throw-rugs are dangerous because they can slide.  Removing the rugs is the best idea, but if they must stay, add adhesive carpet tape to prevent slipping. 7. Do not keep things on stairs or in the halls.  Remove small furniture that blocks the halls as it may cause you to trip.  Keep telephone and electrical cords out of the way where you walk. 8. Always were sturdy, rubber-soled shoes for good support.  Never wear just socks, especially on the stairs.  Socks may cause you to slip or fall.  Do not wear full-length housecoats as you can easily trip on the bottom.  9. Place the things you use the most on the shelves that are the easiest to reach.  If you use a stepstool, make sure it is in good condition.  If you feel unsteady, DO NOT climb, ask for help. 10. If a health professional advises you to use a cane or walker, do not be ashamed.  These items can keep you from falling and breaking your bones.

## 2017-02-27 NOTE — Patient Instructions (Addendum)
Pre visit review using our clinic review tool, if applicable. No additional management support is needed unless otherwise documented below in the visit note.  9/18 - Skip coumadin 9/19 - skip coumadin 9/20 - Check INR @ 9:30

## 2017-02-28 ENCOUNTER — Ambulatory Visit (INDEPENDENT_AMBULATORY_CARE_PROVIDER_SITE_OTHER): Payer: Medicare Other | Admitting: General Practice

## 2017-02-28 DIAGNOSIS — E538 Deficiency of other specified B group vitamins: Secondary | ICD-10-CM | POA: Diagnosis not present

## 2017-02-28 DIAGNOSIS — I4891 Unspecified atrial fibrillation: Secondary | ICD-10-CM

## 2017-02-28 LAB — POCT INR: INR: 4.4

## 2017-02-28 MED ORDER — CYANOCOBALAMIN 1000 MCG/ML IJ SOLN
1000.0000 ug | Freq: Once | INTRAMUSCULAR | Status: AC
  Administered 2017-02-28: 1000 ug via INTRAMUSCULAR

## 2017-02-28 NOTE — Progress Notes (Signed)
I have reviewed and agree with the plan. 

## 2017-03-03 ENCOUNTER — Other Ambulatory Visit: Payer: Self-pay | Admitting: Family

## 2017-03-06 ENCOUNTER — Other Ambulatory Visit: Payer: Self-pay | Admitting: Family

## 2017-03-14 ENCOUNTER — Ambulatory Visit (INDEPENDENT_AMBULATORY_CARE_PROVIDER_SITE_OTHER): Payer: Medicare Other | Admitting: General Practice

## 2017-03-14 ENCOUNTER — Ambulatory Visit: Payer: Medicare Other

## 2017-03-14 DIAGNOSIS — I4891 Unspecified atrial fibrillation: Secondary | ICD-10-CM | POA: Diagnosis not present

## 2017-03-14 DIAGNOSIS — Z7901 Long term (current) use of anticoagulants: Secondary | ICD-10-CM

## 2017-03-14 LAB — POCT INR: INR: 2.6

## 2017-03-14 NOTE — Progress Notes (Signed)
I have reviewed and agree with the plan. 

## 2017-03-14 NOTE — Patient Instructions (Signed)
Pre visit review using our clinic review tool, if applicable. No additional management support is needed unless otherwise documented below in the visit note. 

## 2017-03-15 ENCOUNTER — Other Ambulatory Visit: Payer: Self-pay | Admitting: Family

## 2017-03-22 ENCOUNTER — Ambulatory Visit (INDEPENDENT_AMBULATORY_CARE_PROVIDER_SITE_OTHER): Payer: Medicare Other | Admitting: General Practice

## 2017-03-22 ENCOUNTER — Ambulatory Visit: Payer: Medicare Other

## 2017-03-22 DIAGNOSIS — Z7901 Long term (current) use of anticoagulants: Secondary | ICD-10-CM | POA: Diagnosis not present

## 2017-03-22 DIAGNOSIS — I4891 Unspecified atrial fibrillation: Secondary | ICD-10-CM

## 2017-03-22 LAB — POCT INR: INR: 1.4

## 2017-03-22 NOTE — Patient Instructions (Signed)
9/21 & 9/22 take 1 1/2 tablets  9/23 and 9/24 - Take 1 tablet  9/25 - Resume current dosage and check INR on Friday 9/28 @ the [place] office.

## 2017-03-26 ENCOUNTER — Ambulatory Visit: Payer: Medicare Other | Admitting: Internal Medicine

## 2017-03-30 ENCOUNTER — Ambulatory Visit: Payer: Medicare Other

## 2017-04-03 ENCOUNTER — Other Ambulatory Visit: Payer: Self-pay | Admitting: Family

## 2017-04-09 ENCOUNTER — Other Ambulatory Visit (HOSPITAL_BASED_OUTPATIENT_CLINIC_OR_DEPARTMENT_OTHER): Payer: Medicare Other

## 2017-04-09 ENCOUNTER — Ambulatory Visit (INDEPENDENT_AMBULATORY_CARE_PROVIDER_SITE_OTHER): Payer: Medicare Other | Admitting: General Practice

## 2017-04-09 ENCOUNTER — Other Ambulatory Visit: Payer: Self-pay | Admitting: General Practice

## 2017-04-09 ENCOUNTER — Ambulatory Visit (HOSPITAL_BASED_OUTPATIENT_CLINIC_OR_DEPARTMENT_OTHER): Payer: Medicare Other | Admitting: Nurse Practitioner

## 2017-04-09 VITALS — BP 159/47 | HR 52 | Temp 98.2°F | Resp 20 | Ht 62.0 in | Wt 124.8 lb

## 2017-04-09 DIAGNOSIS — I4891 Unspecified atrial fibrillation: Secondary | ICD-10-CM | POA: Diagnosis not present

## 2017-04-09 DIAGNOSIS — D509 Iron deficiency anemia, unspecified: Secondary | ICD-10-CM

## 2017-04-09 DIAGNOSIS — G8929 Other chronic pain: Secondary | ICD-10-CM

## 2017-04-09 DIAGNOSIS — I10 Essential (primary) hypertension: Secondary | ICD-10-CM | POA: Diagnosis not present

## 2017-04-09 DIAGNOSIS — M545 Low back pain: Secondary | ICD-10-CM

## 2017-04-09 DIAGNOSIS — Z7901 Long term (current) use of anticoagulants: Secondary | ICD-10-CM | POA: Diagnosis not present

## 2017-04-09 DIAGNOSIS — C160 Malignant neoplasm of cardia: Secondary | ICD-10-CM

## 2017-04-09 DIAGNOSIS — R1310 Dysphagia, unspecified: Secondary | ICD-10-CM

## 2017-04-09 LAB — CBC WITH DIFFERENTIAL/PLATELET
BASO%: 0.3 % (ref 0.0–2.0)
Basophils Absolute: 0 10*3/uL (ref 0.0–0.1)
EOS%: 2.3 % (ref 0.0–7.0)
Eosinophils Absolute: 0.1 10*3/uL (ref 0.0–0.5)
HCT: 37.6 % (ref 34.8–46.6)
HGB: 13 g/dL (ref 11.6–15.9)
LYMPH%: 36.9 % (ref 14.0–49.7)
MCH: 30.2 pg (ref 25.1–34.0)
MCHC: 34.6 g/dL (ref 31.5–36.0)
MCV: 87.4 fL (ref 79.5–101.0)
MONO#: 0.4 10*3/uL (ref 0.1–0.9)
MONO%: 9.6 % (ref 0.0–14.0)
NEUT%: 50.9 % (ref 38.4–76.8)
NEUTROS ABS: 2 10*3/uL (ref 1.5–6.5)
Platelets: 130 10*3/uL — ABNORMAL LOW (ref 145–400)
RBC: 4.3 10*6/uL (ref 3.70–5.45)
RDW: 12.5 % (ref 11.2–14.5)
WBC: 3.9 10*3/uL (ref 3.9–10.3)
lymph#: 1.4 10*3/uL (ref 0.9–3.3)

## 2017-04-09 LAB — POCT INR: INR: 3.8

## 2017-04-09 MED ORDER — WARFARIN SODIUM 5 MG PO TABS: ORAL_TABLET | ORAL | 1 refills | Status: DC

## 2017-04-09 NOTE — Progress Notes (Signed)
I agree with this plan.

## 2017-04-09 NOTE — Patient Instructions (Signed)
Pre visit review using our clinic review tool, if applicable. No additional management support is needed unless otherwise documented below in the visit note. 

## 2017-04-09 NOTE — Progress Notes (Signed)
  [place] OFFICE PROGRESS NOTE   Diagnosis:  Esophagus cancer, iron deficiency anemia  INTERVAL HISTORY:   Ms. Whitehorn returns as scheduled. She continues iron 3 times a day. She is tolerating the iron with no significant problems. She denies any bleeding. She reports recent dental surgery. She is tolerating soft solids. She has had some recent episodes of dysphagia. She attributes this to the dental surgery. She has stable chronic back pain.  Objective:  Vital signs in last 24 hours:  Blood pressure (!) 159/47, pulse (!) 52, temperature 98.2 F (36.8 C), temperature source Oral, resp. rate 20, height 5\' 2"  (1.575 m), weight 124 lb 12.8 oz (56.6 kg), SpO2 100 %.    HEENT: Neck without mass. Lymphatics: No palpable cervical, supraclavicular, axillary or inguinal lymph nodes. Resp: Distant breath sounds. No respiratory distress. Cardio: Regular rate and rhythm. GI: Abdomen soft and nontender. No hepatomegaly. No mass. Vascular: No leg edema.   Lab Results:  Lab Results  Component Value Date   WBC 3.9 04/09/2017   HGB 13.0 04/09/2017   HCT 37.6 04/09/2017   MCV 87.4 04/09/2017   PLT 130 (L) 04/09/2017   NEUTROABS 2.0 04/09/2017    Imaging:  No results found.  Medications: I have reviewed the patient's current medications.  Assessment/Plan: 1. 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 Large hiatal hernia. Scar in the cardia. Normal examined duodenum.  CT scans abdomen/pelvis 10/10/2016-nonspecific masslike wall thickening of the stomach. Evaluated by GI medicine at [facility]. CT findings felt to most likely be related to scarring from previous procedure.  2. History of recurrent anemia requiring red cell transfusions, last transfusion  November 2017  3. Paroxysmal atrial fibrillation  4. History of pulmonary embolism  5. Gastroesophageal reflux disease  6. Aortic stenosis  7. Hypertension  8. History of a CVA    Disposition: Ms. Chaudoin appears stable. She remains in clinical remission from the gastric cancer.   She has had some recent episodes of dysphagia. We made a referral to Dr. Stephanie Acre, gastroenterology at [facility], for follow-up.  She has a history of anemia. Hemoglobin remains in normal range. She will decrease oral iron from 3 times daily to 1 time daily. She will return for a follow-up CBC in 2 months.  We scheduled a return visit and CBC in 4 months. She will contact the office in the interim with any problems.  Plan reviewed with Dr. Benay Spice.    Ned Card ANP/GNP-BC   04/09/2017  10:42 AM

## 2017-04-13 ENCOUNTER — Ambulatory Visit: Payer: Medicare Other | Admitting: Family

## 2017-04-23 ENCOUNTER — Ambulatory Visit: Payer: Medicare Other

## 2017-04-25 ENCOUNTER — Ambulatory Visit (INDEPENDENT_AMBULATORY_CARE_PROVIDER_SITE_OTHER): Payer: Medicare Other | Admitting: General Practice

## 2017-04-25 DIAGNOSIS — Z7901 Long term (current) use of anticoagulants: Secondary | ICD-10-CM | POA: Diagnosis not present

## 2017-04-25 DIAGNOSIS — I480 Paroxysmal atrial fibrillation: Secondary | ICD-10-CM

## 2017-04-25 LAB — POCT INR: INR: 4.1

## 2017-04-25 NOTE — Patient Instructions (Signed)
Pre visit review using our clinic review tool, if applicable. No additional management support is needed unless otherwise documented below in the visit note. 

## 2017-04-25 NOTE — Progress Notes (Signed)
I agree with this plan.

## 2017-05-02 ENCOUNTER — Ambulatory Visit (INDEPENDENT_AMBULATORY_CARE_PROVIDER_SITE_OTHER): Payer: Medicare Other | Admitting: Adult Health

## 2017-05-02 ENCOUNTER — Encounter: Payer: Self-pay | Admitting: Adult Health

## 2017-05-02 ENCOUNTER — Ambulatory Visit (INDEPENDENT_AMBULATORY_CARE_PROVIDER_SITE_OTHER): Payer: Medicare Other | Admitting: General Practice

## 2017-05-02 VITALS — BP 144/60 | Temp 98.2°F | Ht 62.0 in | Wt 123.0 lb

## 2017-05-02 DIAGNOSIS — E538 Deficiency of other specified B group vitamins: Secondary | ICD-10-CM

## 2017-05-02 DIAGNOSIS — Z7901 Long term (current) use of anticoagulants: Secondary | ICD-10-CM | POA: Diagnosis not present

## 2017-05-02 DIAGNOSIS — Z7689 Persons encountering health services in other specified circumstances: Secondary | ICD-10-CM

## 2017-05-02 DIAGNOSIS — I10 Essential (primary) hypertension: Secondary | ICD-10-CM

## 2017-05-02 DIAGNOSIS — Z23 Encounter for immunization: Secondary | ICD-10-CM | POA: Diagnosis not present

## 2017-05-02 DIAGNOSIS — Z760 Encounter for issue of repeat prescription: Secondary | ICD-10-CM

## 2017-05-02 DIAGNOSIS — I200 Unstable angina: Secondary | ICD-10-CM | POA: Diagnosis not present

## 2017-05-02 LAB — VITAMIN B12: Vitamin B-12: >1500 pg/mL — ABNORMAL HIGH (ref 211–911)

## 2017-05-02 LAB — POCT INR: INR: 2

## 2017-05-02 MED ORDER — CYANOCOBALAMIN 1000 MCG/ML IJ SOLN
1000.0000 ug | Freq: Once | INTRAMUSCULAR | Status: AC
  Administered 2017-05-02: 1000 ug via INTRAMUSCULAR

## 2017-05-02 NOTE — Progress Notes (Signed)
I agree with this plan.

## 2017-05-02 NOTE — Progress Notes (Signed)
Patient presents to clinic today to establish care. She is a pleasant 76 year old female who  has a past medical history of Anemia (2005); Aortic stenosis; Arthritis; Broken back (2013); CAD (coronary artery disease); CHF (congestive heart failure) ([place]); Contrast media allergy; Diastolic heart failure ([place]); Esophageal cancer ([place]) (05/06/2015); GERD (gastroesophageal reflux disease); H/O iron deficiency; HH (hiatus hernia) (2008); Hypertension; Paroxysmal atrial fibrillation ([place]); PONV (postoperative nausea and vomiting); Pulmonary emboli ([facility]) (2008, 2012); Stroke [facility]); and Transfusion history.   She is a previous patient of Terri Piedra, NP  who is transferring to me    Acute Concerns: Establish Care   B12 deficiency - she would like a B12 injection   Chronic Issues:  Chronic Anti coagulation for atrial fibrillation and history of PE.[name] She is currently on coumadin.  CHF  - she reports that this is a new finding? She is being seen by Dr. Debara Pickett for her cardiac issues. She has a echo scheduled.    She had a left heart cath in 10/2016 for evaluation of angina which showed    There is hyperdynamic left ventricular systolic function.  The left ventricular ejection fraction is greater than 65% by visual estimate.  LV end diastolic pressure is moderately elevated.  Moderate two-vessel disease: Ramus lesion, 60 %stenosed. RPDA lesion, 60 %stenosed.  There is mild aortic valve stenosis.  Hypertension - She takes Norvasc 10 mg, Coreg 12. 5mg  and Clonidine patch.  BP Readings from Last 3 Encounters:  05/02/17 (!) 144/60  04/09/17 (!) 159/47  02/22/17 124/62   GERD - Takes Protonix, feels like it is controlled.   Health Maintenance: Dental -- Does not do routine care  Vision -- Does not do routine eye care  Immunizations --  She is due for prevnar 13 and flu. She refused flu at this time.  Colonoscopy -- no longer needs  Mammogram -- No longer needs  PAP --  No longer needed    Bone Density -- Refused   She is followed by   Cardiology - Dr. Debara Pickett  Oncology - Dr. Benay Spice    Past Medical History:  Diagnosis Date  . Anemia 2005   Generally microcytic, transfusions in [address], 2012, 02/2015, 05/2015  . Aortic stenosis    Moderate November 2017  . Arthritis   . Broken back 2013   Chronic back pain.   [name] CAD (coronary artery disease)    Cardiac catheterization 2014 - 80% mid RCA and 70% OM managed medically  . CHF (congestive heart failure) ([place])   . Contrast media allergy   . Diastolic heart failure ([place])   . Esophageal cancer ([place]) 05/06/2015   Adenocarcinoma GE junction  . GERD (gastroesophageal reflux disease)   . H/O iron deficiency    05-06-15 iron infusion ([facility])  . HH (hiatus hernia) 2008   Large with associated erosions  . Hypertension   . Paroxysmal atrial fibrillation ([name])   . PONV (postoperative nausea and vomiting)   . Pulmonary emboli ([place]) 2008, 2012  . Stroke ([place])   . Transfusion history    2 units transfused 05-06-15 ([facility])    Past Surgical History:  Procedure Laterality Date  . APPENDECTOMY  1950s  . BACK SURGERY    . CARPAL TUNNEL RELEASE Bilateral 1990s  . [place] SURGERY  2014  . CHOLECYSTECTOMY  1980s   open  . COLONOSCOPY N/A 02/17/2015   Procedure: COLONOSCOPY;  Surgeon: [name], MD;  Location: [facility] ENDOSCOPY;  Service: Endoscopy;  Laterality: N/A;  . ESOPHAGOGASTRODUODENOSCOPY N/A 02/16/2015   Procedure: ESOPHAGOGASTRODUODENOSCOPY (EGD);  Surgeon: [name], MD;  Location: [facility];  Service: Endoscopy;  Laterality: N/A;  . ESOPHAGOGASTRODUODENOSCOPY N/A 05/06/2015   Procedure: ESOPHAGOGASTRODUODENOSCOPY (EGD);  Surgeon: [name], MD;  Location: [facility] ENDOSCOPY;  Service: Endoscopy;  Laterality: N/A;  . EUS N/A 05/20/2015   Procedure: UPPER ENDOSCOPIC ULTRASOUND (EUS) LINEAR;  Surgeon: [name], MD;  Location: [facility] ENDOSCOPY;  Service: Endoscopy;  Laterality: N/A;  . exploratory lab  1950s or  1960s  . GIVENS CAPSULE STUDY N/A 05/06/2015   Procedure: GIVENS CAPSULE STUDY;  Surgeon: [name], MD;  Location: [facility] ENDOSCOPY;  Service: Endoscopy;  Laterality: N/A;  . LEFT HEART CATH AND CORONARY ANGIOGRAPHY N/A 11/08/2016   Procedure: Left Heart Cath and Coronary Angiography;  Surgeon: [name], MD;  Location: [facility];  Service: Cardiovascular;  Laterality: N/A;  . lumbar back surgery  2012  . [place] SURGERY  1991  . TONSILLECTOMY    . TONSILLECTOMY AND ADENOIDECTOMY  1960s    Current Outpatient Prescriptions on File Prior to Visit  Medication Sig Dispense Refill  . acetaminophen (TYLENOL) 500 MG tablet Take 500-1,000 mg by mouth daily as needed for mild pain or moderate pain.     [name] amLODipine (NORVASC) 10 MG tablet Take 1 tablet (10 mg total) by mouth daily. 30 tablet 5  . aspirin EC 81 MG tablet Take 1 tablet (81 mg total) by mouth daily. 90 tablet 3  . carvedilol (COREG) 12.5 MG tablet TAKE (1) TABLET BY MOUTH TWICE DAILY. 180 tablet 3  . cloNIDine (CATAPRES - DOSED IN MG/24 HR) 0.2 mg/24hr patch Place 1 patch (0.2 mg total) onto the skin once a week. 4 patch 5  . cyanocobalamin (,VITAMIN B-12,) 1000 MCG/ML injection IM daily 11/29-12/3, then weekly x4, then monthly. (Patient taking differently: Inject 1,000 mcg into the muscle every 30 (thirty) days. ) 9 mL 0  . ferrous sulfate 325 (65 FE) MG tablet Take 325 mg by mouth 3 (three) times daily with meals.     . furosemide (LASIX) 40 MG tablet Take 1 tablet (40 mg total) by mouth daily. 30 tablet 5  . gabapentin (NEURONTIN) 100 MG capsule TAKE 1 CAPSULE BY MOUTH THREE TIMES A DAY. 270 capsule 0  . pantoprazole (PROTONIX) 40 MG tablet TAKE 1 TABLET BY MOUTH TWICE DAILY. 180 tablet 0  . Pediatric Multivitamins-Iron (FLINTSTONES PLUS IRON) chewable tablet Chew 2 tablets by mouth daily. 60 tablet 0  . simvastatin (ZOCOR) 20 MG tablet TAKE (1) TABLET BY MOUTH AT BEDTIME. Needs office visit for more refills. 30 tablet  0  . temazepam (RESTORIL) 15 MG capsule Take 1 capsule (15 mg total) by mouth at bedtime. 30 capsule 2  . traMADol (ULTRAM) 50 MG tablet TAKE ONE TABLET BY MOUTH EVERY 8 HOURS. 90 tablet 0  . warfarin (COUMADIN) 5 MG tablet TAKE 1/2 TABLET BY MOUTH ON WEDNESDAY AND 1 TABLET ALL OTHER DAYS OR AS DIRECTED BY ANTICOAGULATION CLINIC. 90 tablet 1   No current facility-administered medications on file prior to visit.     Allergies  Allergen Reactions  . Iodinated Diagnostic Agents Anaphylaxis and Other (See Comments)    IPD dye Info given by patient  . Ioxaglate Anaphylaxis and Other (See Comments)    Info given by patient  . Milk-Related Compounds Anaphylaxis and Other (See Comments)    Lactose intolerance Can tolerate milk if its cooked into  the recipe, just can't drink milk   . Red Dye Anaphylaxis  . Whey Other (See Comments)    Lactose intolerance  . Darvon [Propoxyphene] Rash  . Hydralazine Anxiety and Other (See Comments)    Facial flushing, pt prefers not to use it.     Family History  Problem Relation Age of Onset  . Stroke Mother   . Heart disease Mother   . Emphysema Father   . Ovarian cancer Sister   . Stroke Sister   . Other Child        died at birth    Social History   Social History  . Marital status: Divorced    Spouse name: N/A  . Number of children: 3  . Years of education: 14   Occupational History  . Retired    Social History Main Topics  . Smoking status: Never Smoker  . Smokeless tobacco: Never Used  . Alcohol use No  . Drug use: No  . Sexual activity: Not Currently   Other Topics Concern  . Not on file   Social History Narrative   Born and raised in [place], [place]. Currently resides in a house with her son. 1 dog. Fun: go to church   Divorced(Has total of #3 children)-[place], [place], Honor Junes   Denies religious beliefs that would effect health care.    Has strong faith   Prior employment: Set designer and  worked in lab at [employer]: Negative.   Respiratory: Negative.   Cardiovascular: Negative.   Genitourinary: Negative.   Musculoskeletal: Negative.   Neurological: Negative.   Psychiatric/Behavioral: Negative.   All other systems reviewed and are negative.  BP (!) 144/60 (BP Location: Left Arm)   Temp 98.2 F (36.8 C) (Oral)   Ht 5\' 2"  (1.575 m)   Wt 123 lb (55.8 kg)   BMI 22.50 kg/m   Physical Exam  Constitutional: She is oriented to person, place, and time and well-developed, well-nourished, and in no distress. No distress.  Cardiovascular: Normal rate, regular rhythm, normal heart sounds and intact distal pulses.  Exam reveals no gallop and no friction rub.   No murmur heard. Pulmonary/Chest: Effort normal and breath sounds normal. No respiratory distress. She has no wheezes. She has no rales. She exhibits no tenderness.  Neurological: She is alert and oriented to person, place, and time. Gait normal. GCS score is 15.  Skin: Skin is warm and dry. No rash noted. She is not diaphoretic. No erythema. No pallor.  Psychiatric: Mood, memory, affect and judgment normal.  Nursing note and vitals reviewed.      Recent Results (from the past 2160 hour(s))  Ferritin     Status: None   Collection Time: 02/05/17  9:54 AM  Result Value Ref Range   Ferritin 105 9 - 269 ng/ml  CBC with Differential     Status: Abnormal   Collection Time: 02/05/17  9:54 AM  Result Value Ref Range   WBC 4.2 3.9 - 10.3 10e3/uL   NEUT# 2.5 1.5 - 6.5 10e3/uL   HGB 13.1 11.6 - 15.9 g/dL   HCT 38.2 34.8 - 46.6 %   Platelets 140 (L) 145 - 400 10e3/uL   MCV 86.2 79.5 - 101.0 fL   MCH 29.6 25.1 - 34.0 pg   MCHC 34.3 31.5 - 36.0 g/dL   RBC 4.43 3.70 - 5.45 10e6/uL   RDW 13.1 11.2 - 14.5 %   lymph# 1.2 0.9 -  3.3 10e3/uL   MONO# 0.3 0.1 - 0.9 10e3/uL   Eosinophils Absolute 0.1 0.0 - 0.5 10e3/uL   Basophils Absolute 0.0 0.0 - 0.1 10e3/uL   NEUT% 60.5 38.4 - 76.8  %   LYMPH% 28.8 14.0 - 49.7 %   MONO% 7.4 0.0 - 14.0 %   EOS% 3.1 0.0 - 7.0 %   BASO% 0.2 0.0 - 2.0 %  POCT INR     Status: None   Collection Time: 02/28/17 12:00 AM  Result Value Ref Range   INR 4.4   POCT INR     Status: None   Collection Time: 03/14/17 12:00 AM  Result Value Ref Range   INR 2.6   POCT INR     Status: None   Collection Time: 03/22/17 12:00 AM  Result Value Ref Range   INR 1.4   POCT INR     Status: None   Collection Time: 04/09/17 12:00 AM  Result Value Ref Range   INR 3.8   CBC with Differential     Status: Abnormal   Collection Time: 04/09/17 10:08 AM  Result Value Ref Range   WBC 3.9 3.9 - 10.3 10e3/uL   NEUT# 2.0 1.5 - 6.5 10e3/uL   HGB 13.0 11.6 - 15.9 g/dL   HCT 37.6 34.8 - 46.6 %   Platelets 130 (L) 145 - 400 10e3/uL   MCV 87.4 79.5 - 101.0 fL   MCH 30.2 25.1 - 34.0 pg   MCHC 34.6 31.5 - 36.0 g/dL   RBC 4.30 3.70 - 5.45 10e6/uL   RDW 12.5 11.2 - 14.5 %   lymph# 1.4 0.9 - 3.3 10e3/uL   MONO# 0.4 0.1 - 0.9 10e3/uL   Eosinophils Absolute 0.1 0.0 - 0.5 10e3/uL   Basophils Absolute 0.0 0.0 - 0.1 10e3/uL   NEUT% 50.9 38.4 - 76.8 %   LYMPH% 36.9 14.0 - 49.7 %   MONO% 9.6 0.0 - 14.0 %   EOS% 2.3 0.0 - 7.0 %   BASO% 0.3 0.0 - 2.0 %  POCT INR     Status: None   Collection Time: 04/25/17 12:00 AM  Result Value Ref Range   INR 4.1     Assessment/Plan:  1. Encounter to establish care - Follow up for CPE  - Follow up sooner if needed  2. B12 deficiency  - Vitamin B12 - cyanocobalamin ((VITAMIN B-12)) injection 1,000 mcg; Inject 1 mL (1,000 mcg total) into the muscle once.  3. Anticoagulant long-term use  - POC INR  4. Need for vaccination with 13-polyvalent pneumococcal conjugate vaccine  - Pneumococcal conjugate vaccine 13-valent  5. Medication refill  - traMADol (ULTRAM) 50 MG tablet; Take 1 tablet (50 mg total) by mouth every 8 (eight) hours.  Dispense: 90 tablet; Refill: 0 - amLODipine (NORVASC) 10 MG tablet; Take 1 tablet (10 mg  total) by mouth daily.  Dispense: 90 tablet; Refill: 3 - temazepam (RESTORIL) 15 MG capsule; Take 1 capsule (15 mg total) by mouth at bedtime.  Dispense: 30 capsule; Refill: 0  6. Uncontrolled hypertension - amLODipine (NORVASC) 10 MG tablet; Take 1 tablet (10 mg total) by mouth daily.  Dispense: 90 tablet; Refill: 3  Dorothyann Peng, NP

## 2017-05-02 NOTE — Patient Instructions (Signed)
It was great meeting you today!  Please follow up with me for your physical. If you need anything in the meantime, please let me know.    

## 2017-05-02 NOTE — Patient Instructions (Signed)
Pre visit review using our clinic review tool, if applicable. No additional management support is needed unless otherwise documented below in the visit note. 

## 2017-05-04 ENCOUNTER — Telehealth: Payer: Self-pay | Admitting: Adult Health

## 2017-05-04 DIAGNOSIS — I10 Essential (primary) hypertension: Secondary | ICD-10-CM

## 2017-05-04 DIAGNOSIS — Z760 Encounter for issue of repeat prescription: Secondary | ICD-10-CM

## 2017-05-04 MED ORDER — AMLODIPINE BESYLATE 10 MG PO TABS: 10.0000 mg | ORAL_TABLET | Freq: Every day | ORAL | 3 refills | Status: DC

## 2017-05-04 MED ORDER — AMLODIPINE BESYLATE 10 MG PO TABS: 10.0000 mg | ORAL_TABLET | Freq: Every day | ORAL | 0 refills | Status: DC

## 2017-05-04 MED ORDER — TEMAZEPAM 15 MG PO CAPS: 15.0000 mg | ORAL_CAPSULE | Freq: Every day | ORAL | 0 refills | Status: DC

## 2017-05-04 MED ORDER — TRAMADOL HCL 50 MG PO TABS: 50.0000 mg | ORAL_TABLET | Freq: Three times a day (TID) | ORAL | 0 refills | Status: DC

## 2017-05-04 NOTE — Telephone Encounter (Signed)
° °  Pt said she is out of medicine   Pt request refill of the following:  temazepam (RESTORIL) 15 MG capsule  traMADol (ULTRAM) 50 MG tablet  amLODipine (NORVASC) 10 MG tablet     Phamacy:  [facility]

## 2017-05-04 NOTE — Telephone Encounter (Signed)
Called in Rx's to pharmacy on 05/04/2017 @ 1:41 PM for Tramadol and temazepam. Rx sent electronically for the amlodipine.

## 2017-05-04 NOTE — Telephone Encounter (Signed)
Medications will be sent in

## 2017-05-11 ENCOUNTER — Ambulatory Visit: Payer: Medicare Other | Admitting: Adult Health

## 2017-05-15 ENCOUNTER — Ambulatory Visit: Payer: Medicare Other

## 2017-05-30 ENCOUNTER — Ambulatory Visit (INDEPENDENT_AMBULATORY_CARE_PROVIDER_SITE_OTHER): Payer: Medicare Other | Admitting: General Practice

## 2017-05-30 ENCOUNTER — Ambulatory Visit: Payer: Medicare Other | Admitting: General Practice

## 2017-05-30 DIAGNOSIS — E538 Deficiency of other specified B group vitamins: Secondary | ICD-10-CM

## 2017-05-30 DIAGNOSIS — Z7901 Long term (current) use of anticoagulants: Secondary | ICD-10-CM

## 2017-05-30 DIAGNOSIS — I4891 Unspecified atrial fibrillation: Secondary | ICD-10-CM | POA: Diagnosis not present

## 2017-05-30 LAB — POCT INR: INR: 3

## 2017-05-30 MED ORDER — CYANOCOBALAMIN 1000 MCG/ML IJ SOLN
1000.0000 ug | Freq: Once | INTRAMUSCULAR | Status: AC
  Administered 2017-05-30: 1000 ug via INTRAMUSCULAR

## 2017-05-30 NOTE — Patient Instructions (Addendum)
Pre visit review using our clinic review tool, if applicable. No additional management support is needed unless otherwise documented below in the visit note.  Continue to take 1 tablet daily except for 1/2  tablet on Monday/Wed/Fridays.  Re-check in 4 weeks.

## 2017-05-30 NOTE — Patient Instructions (Signed)
Pre visit review using our clinic review tool, if applicable. No additional management support is needed unless otherwise documented below in the visit note. 

## 2017-05-30 NOTE — Progress Notes (Signed)
I agree with this plan.

## 2017-06-04 ENCOUNTER — Other Ambulatory Visit (HOSPITAL_BASED_OUTPATIENT_CLINIC_OR_DEPARTMENT_OTHER): Payer: Medicare Other

## 2017-06-04 DIAGNOSIS — C160 Malignant neoplasm of cardia: Secondary | ICD-10-CM

## 2017-06-04 LAB — CBC WITH DIFFERENTIAL/PLATELET
BASO%: 0.9 % (ref 0.0–2.0)
BASOS ABS: 0 10*3/uL (ref 0.0–0.1)
EOS%: 3.8 % (ref 0.0–7.0)
Eosinophils Absolute: 0.2 10*3/uL (ref 0.0–0.5)
HEMATOCRIT: 38.6 % (ref 34.8–46.6)
HGB: 13.1 g/dL (ref 11.6–15.9)
LYMPH%: 30.6 % (ref 14.0–49.7)
MCH: 30 pg (ref 25.1–34.0)
MCHC: 33.9 g/dL (ref 31.5–36.0)
MCV: 88.5 fL (ref 79.5–101.0)
MONO#: 0.4 10*3/uL (ref 0.1–0.9)
MONO%: 8.6 % (ref 0.0–14.0)
NEUT#: 2.7 10*3/uL (ref 1.5–6.5)
NEUT%: 56.1 % (ref 38.4–76.8)
Platelets: 158 10*3/uL (ref 145–400)
RBC: 4.36 10*6/uL (ref 3.70–5.45)
RDW: 12.7 % (ref 11.2–14.5)
WBC: 4.8 10*3/uL (ref 3.9–10.3)
lymph#: 1.5 10*3/uL (ref 0.9–3.3)

## 2017-06-07 ENCOUNTER — Other Ambulatory Visit: Payer: Self-pay | Admitting: Adult Health

## 2017-06-07 DIAGNOSIS — Z760 Encounter for issue of repeat prescription: Secondary | ICD-10-CM

## 2017-06-08 NOTE — Telephone Encounter (Signed)
Temazepam called to the pharmacy and gave to pharmacist.  Others sent in electronically.

## 2017-06-08 NOTE — Telephone Encounter (Signed)
Ok to refill restoril for 30 days  Ok to refill Simvastatin and Protonix for 90 + 1

## 2017-06-27 ENCOUNTER — Ambulatory Visit (INDEPENDENT_AMBULATORY_CARE_PROVIDER_SITE_OTHER): Payer: Medicare Other | Admitting: General Practice

## 2017-06-27 DIAGNOSIS — Z7901 Long term (current) use of anticoagulants: Secondary | ICD-10-CM

## 2017-06-27 DIAGNOSIS — I4891 Unspecified atrial fibrillation: Secondary | ICD-10-CM

## 2017-06-27 DIAGNOSIS — E538 Deficiency of other specified B group vitamins: Secondary | ICD-10-CM | POA: Diagnosis not present

## 2017-06-27 LAB — POCT INR: INR: 1.8

## 2017-06-27 MED ORDER — CYANOCOBALAMIN 1000 MCG/ML IJ SOLN
1000.0000 ug | Freq: Once | INTRAMUSCULAR | Status: AC
  Administered 2017-06-27: 1000 ug via INTRAMUSCULAR

## 2017-06-27 NOTE — Progress Notes (Signed)
I have reviewed and agree with this plan  

## 2017-06-27 NOTE — Patient Instructions (Addendum)
Pre visit review using our clinic review tool, if applicable. No additional management support is needed unless otherwise documented below in the visit note.  Take 1 tablet today and then continue to take 1 tablet daily except for 1/2  tablet on Monday/Wed/Fridays.  Re-check in 4 weeks.

## 2017-07-11 ENCOUNTER — Other Ambulatory Visit: Payer: Self-pay | Admitting: Family Medicine

## 2017-07-11 DIAGNOSIS — Z760 Encounter for issue of repeat prescription: Secondary | ICD-10-CM

## 2017-07-11 NOTE — Telephone Encounter (Signed)
Call in #30 with no rf  

## 2017-07-11 NOTE — Telephone Encounter (Signed)
Last filled on 06/08/17 #30. Please advise.  Thanks!!

## 2017-07-12 MED ORDER — TEMAZEPAM 15 MG PO CAPS: ORAL_CAPSULE | ORAL | 0 refills | Status: DC

## 2017-07-12 NOTE — Telephone Encounter (Signed)
Called to the pharmacy and gave to pharmacist, Jeani Hawking.

## 2017-07-25 ENCOUNTER — Ambulatory Visit (INDEPENDENT_AMBULATORY_CARE_PROVIDER_SITE_OTHER): Payer: Medicare Other | Admitting: General Practice

## 2017-07-25 ENCOUNTER — Telehealth: Payer: Self-pay | Admitting: Adult Health

## 2017-07-25 DIAGNOSIS — Z7901 Long term (current) use of anticoagulants: Secondary | ICD-10-CM | POA: Diagnosis not present

## 2017-07-25 DIAGNOSIS — I4891 Unspecified atrial fibrillation: Secondary | ICD-10-CM

## 2017-07-25 DIAGNOSIS — E538 Deficiency of other specified B group vitamins: Secondary | ICD-10-CM | POA: Diagnosis not present

## 2017-07-25 LAB — POCT INR: INR: 3.2

## 2017-07-25 MED ORDER — CYANOCOBALAMIN 1000 MCG/ML IJ SOLN
1000.0000 ug | Freq: Once | INTRAMUSCULAR | Status: AC
  Administered 2017-07-25: 1000 ug via INTRAMUSCULAR

## 2017-07-25 NOTE — Patient Instructions (Addendum)
Pre visit review using our clinic review tool, if applicable. No additional management support is needed unless otherwise documented below in the visit note.  Skip coumadin today and then continue to take 1 tablet daily except for 1/2  tablet on Monday/Wed/Fridays.  Re-check in 4 weeks.

## 2017-07-25 NOTE — Progress Notes (Signed)
I have reviewed and agree at this plan

## 2017-07-25 NOTE — Telephone Encounter (Signed)
Handicap Placard form to be filled out- placed in dr's folder.  Mail to [facility], [address], [place], [place] [address]

## 2017-07-26 ENCOUNTER — Telehealth: Payer: Self-pay | Admitting: Adult Health

## 2017-07-26 MED ORDER — FUROSEMIDE 40 MG PO TABS: 40.0000 mg | ORAL_TABLET | Freq: Every day | ORAL | 0 refills | Status: DC

## 2017-07-26 MED ORDER — GABAPENTIN 100 MG PO CAPS: ORAL_CAPSULE | ORAL | 0 refills | Status: DC

## 2017-07-26 NOTE — Telephone Encounter (Signed)
Left a message informing the pt that paper is available for pick up at the front desk.

## 2017-07-26 NOTE — Telephone Encounter (Signed)
Pt now scheduled for 09/05/17 for yearly visit. [name], you have not prescribed in the past.  Okay to fill?

## 2017-07-26 NOTE — Telephone Encounter (Signed)
Ok to refill both for 90 days at this time

## 2017-07-26 NOTE — Telephone Encounter (Signed)
Form filled out and waiting on signature from provider.

## 2017-07-26 NOTE — Telephone Encounter (Signed)
Sent by e-scribe. 

## 2017-07-26 NOTE — Telephone Encounter (Signed)
Copied from [place] [id number]. Topic: Quick Communication - Rx Refill/Question >> Jul 26, 2017 10:37 AM Celedonio Savage L wrote: Medication:  furosemide (LASIX) 40 MG tablet  gabapentin (NEURONTIN) 100 MG capsule      Has the patient contacted their pharmacy? Yes.  They faxed over request a week ago   (Agent: If no, request that the patient contact the pharmacy for the refill.)   Preferred Pharmacy (with phone number or street name): [facility], [place] [phone] (Phone) [phone] (Fax)     Agent: Please be advised that RX refills may take up to 3 business days. We ask that you follow-up with your pharmacy.

## 2017-07-31 ENCOUNTER — Other Ambulatory Visit: Payer: Self-pay | Admitting: Family Medicine

## 2017-07-31 DIAGNOSIS — Z760 Encounter for issue of repeat prescription: Secondary | ICD-10-CM

## 2017-07-31 MED ORDER — TEMAZEPAM 15 MG PO CAPS
ORAL_CAPSULE | ORAL | 0 refills | Status: DC
Start: 2017-07-31 — End: 2017-09-03

## 2017-08-06 ENCOUNTER — Telehealth: Payer: Self-pay | Admitting: Oncology

## 2017-08-06 ENCOUNTER — Inpatient Hospital Stay: Payer: Medicare Other

## 2017-08-06 ENCOUNTER — Inpatient Hospital Stay: Payer: Medicare Other | Attending: Oncology | Admitting: Oncology

## 2017-08-06 ENCOUNTER — Encounter: Payer: Self-pay | Admitting: Oncology

## 2017-08-06 VITALS — BP 155/56 | HR 58 | Temp 97.5°F | Resp 18 | Ht 62.0 in | Wt 120.9 lb

## 2017-08-06 DIAGNOSIS — K219 Gastro-esophageal reflux disease without esophagitis: Secondary | ICD-10-CM

## 2017-08-06 DIAGNOSIS — G629 Polyneuropathy, unspecified: Secondary | ICD-10-CM | POA: Diagnosis not present

## 2017-08-06 DIAGNOSIS — M129 Arthropathy, unspecified: Secondary | ICD-10-CM | POA: Diagnosis not present

## 2017-08-06 DIAGNOSIS — C160 Malignant neoplasm of cardia: Secondary | ICD-10-CM

## 2017-08-06 DIAGNOSIS — R1310 Dysphagia, unspecified: Secondary | ICD-10-CM | POA: Diagnosis not present

## 2017-08-06 DIAGNOSIS — Z86711 Personal history of pulmonary embolism: Secondary | ICD-10-CM

## 2017-08-06 DIAGNOSIS — Z8673 Personal history of transient ischemic attack (TIA), and cerebral infarction without residual deficits: Secondary | ICD-10-CM | POA: Diagnosis not present

## 2017-08-06 DIAGNOSIS — D649 Anemia, unspecified: Secondary | ICD-10-CM

## 2017-08-06 DIAGNOSIS — R630 Anorexia: Secondary | ICD-10-CM | POA: Diagnosis not present

## 2017-08-06 DIAGNOSIS — Z8501 Personal history of malignant neoplasm of esophagus: Secondary | ICD-10-CM | POA: Diagnosis not present

## 2017-08-06 DIAGNOSIS — I480 Paroxysmal atrial fibrillation: Secondary | ICD-10-CM | POA: Diagnosis not present

## 2017-08-06 DIAGNOSIS — K449 Diaphragmatic hernia without obstruction or gangrene: Secondary | ICD-10-CM | POA: Insufficient documentation

## 2017-08-06 DIAGNOSIS — I10 Essential (primary) hypertension: Secondary | ICD-10-CM | POA: Diagnosis not present

## 2017-08-06 DIAGNOSIS — I350 Nonrheumatic aortic (valve) stenosis: Secondary | ICD-10-CM | POA: Diagnosis not present

## 2017-08-06 LAB — CBC WITH DIFFERENTIAL/PLATELET
Basophils Absolute: 0 10*3/uL (ref 0.0–0.1)
Basophils Relative: 0 %
EOS PCT: 5 %
Eosinophils Absolute: 0.2 10*3/uL (ref 0.0–0.5)
HCT: 40.5 % (ref 34.8–46.6)
HEMOGLOBIN: 13.9 g/dL (ref 11.6–15.9)
LYMPHS ABS: 1.5 10*3/uL (ref 0.9–3.3)
LYMPHS PCT: 31 %
MCH: 30.3 pg (ref 25.1–34.0)
MCHC: 34.3 g/dL (ref 31.5–36.0)
MCV: 88.2 fL (ref 79.5–101.0)
Monocytes Absolute: 0.5 10*3/uL (ref 0.1–0.9)
Monocytes Relative: 10 %
NEUTROS ABS: 2.6 10*3/uL (ref 1.5–6.5)
NEUTROS PCT: 54 %
PLATELETS: 140 10*3/uL — AB (ref 145–400)
RBC: 4.59 MIL/uL (ref 3.70–5.45)
RDW: 12.4 % (ref 11.2–14.5)
WBC: 4.8 10*3/uL (ref 3.9–10.3)

## 2017-08-06 NOTE — Progress Notes (Signed)
  [name] OFFICE PROGRESS NOTE   Diagnosis: Esophagus cancer  INTERVAL HISTORY:   [name] returns as scheduled.  She has multiple complaints including "arthritis "and neuropathy pain in the hands.  She relates intermittent dysphagia to dentures.  She has a poor appetite.  She continues iron.  No obvious bleeding, though her stools are "great ".  Objective:  Vital signs in last 24 hours:  Blood pressure (!) 155/56, pulse (!) 58, temperature (!) 97.5 F (36.4 C), temperature source Oral, resp. rate 18, height 5\' 2"  (1.575 m), weight 120 lb 14.4 oz (54.8 kg), SpO2 98 %.    HEENT: Neck without mass Lymphatics: No cervical, supraclavicular, or axillary nodes Resp: Lungs clear bilaterally Cardio: Regular rate and rhythm GI: No hepatomegaly, no mass, nontender Vascular: No leg edema  Lab Results:  Lab Results  Component Value Date   WBC 4.8 08/06/2017   HGB 13.9 08/06/2017   HCT 40.5 08/06/2017   MCV 88.2 08/06/2017   PLT 140 (L) 08/06/2017   NEUTROABS 2.6 08/06/2017      Medications: I have reviewed the patient's current medications.   Assessment/Plan: 1. 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 Large hiatal hernia. Scar in the cardia. Normal examined duodenum.  CT scans abdomen/pelvis 10/10/2016-nonspecific masslike wall thickening of the stomach. Evaluated by GI medicine at [facility]. CT findings felt to most likely be related to scarring from previous procedure.  2. History of recurrent anemia requiring red cell transfusions, last transfusion November 2017  3. Paroxysmal atrial fibrillation  4. History of pulmonary embolism  5. Gastroesophageal reflux disease  6. Aortic stenosis  7. Hypertension  8. History of a  CVA    Disposition: [name] remains in clinical remission from gastric cancer.  She has not been scheduled for follow-up surveillance at [facility].  We will make a referral to Dr. Stephanie Acre.  I doubt the weight loss is related to recurrence of gastric cancer, but this is possible.  She has multiple comorbid conditions. She has continued reflux symptoms.  She will decrease the ferrous sulfate to twice daily. [name] will return for an office and lab visit in 3 months.  Betsy Coder, MD  08/06/2017  12:37 PM

## 2017-08-06 NOTE — Telephone Encounter (Signed)
Scheduled appt per 2/4 los - sent reminder letter in the mail . Lab and f/u in 3 months.

## 2017-08-15 DIAGNOSIS — N819 Female genital prolapse, unspecified: Secondary | ICD-10-CM | POA: Diagnosis not present

## 2017-08-16 DIAGNOSIS — H3530 Unspecified macular degeneration: Secondary | ICD-10-CM | POA: Diagnosis not present

## 2017-08-16 DIAGNOSIS — H353230 Exudative age-related macular degeneration, bilateral, stage unspecified: Secondary | ICD-10-CM | POA: Diagnosis not present

## 2017-08-16 DIAGNOSIS — H25093 Other age-related incipient cataract, bilateral: Secondary | ICD-10-CM | POA: Diagnosis not present

## 2017-08-16 DIAGNOSIS — H5203 Hypermetropia, bilateral: Secondary | ICD-10-CM | POA: Diagnosis not present

## 2017-08-20 ENCOUNTER — Ambulatory Visit (HOSPITAL_COMMUNITY): Payer: Medicare Other | Attending: Cardiovascular Disease

## 2017-08-20 ENCOUNTER — Encounter (INDEPENDENT_AMBULATORY_CARE_PROVIDER_SITE_OTHER): Payer: Medicare Other | Admitting: Ophthalmology

## 2017-08-20 ENCOUNTER — Other Ambulatory Visit: Payer: Self-pay

## 2017-08-20 DIAGNOSIS — H353132 Nonexudative age-related macular degeneration, bilateral, intermediate dry stage: Secondary | ICD-10-CM | POA: Diagnosis not present

## 2017-08-20 DIAGNOSIS — I509 Heart failure, unspecified: Secondary | ICD-10-CM | POA: Diagnosis not present

## 2017-08-20 DIAGNOSIS — I10 Essential (primary) hypertension: Secondary | ICD-10-CM | POA: Diagnosis not present

## 2017-08-20 DIAGNOSIS — I480 Paroxysmal atrial fibrillation: Secondary | ICD-10-CM | POA: Diagnosis not present

## 2017-08-20 DIAGNOSIS — D649 Anemia, unspecified: Secondary | ICD-10-CM | POA: Insufficient documentation

## 2017-08-20 DIAGNOSIS — H2513 Age-related nuclear cataract, bilateral: Secondary | ICD-10-CM | POA: Diagnosis not present

## 2017-08-20 DIAGNOSIS — H35033 Hypertensive retinopathy, bilateral: Secondary | ICD-10-CM

## 2017-08-20 DIAGNOSIS — Z8673 Personal history of transient ischemic attack (TIA), and cerebral infarction without residual deficits: Secondary | ICD-10-CM | POA: Insufficient documentation

## 2017-08-20 DIAGNOSIS — I110 Hypertensive heart disease with heart failure: Secondary | ICD-10-CM | POA: Diagnosis not present

## 2017-08-20 DIAGNOSIS — I2510 Atherosclerotic heart disease of native coronary artery without angina pectoris: Secondary | ICD-10-CM | POA: Diagnosis not present

## 2017-08-20 DIAGNOSIS — H43813 Vitreous degeneration, bilateral: Secondary | ICD-10-CM

## 2017-08-20 DIAGNOSIS — I080 Rheumatic disorders of both mitral and aortic valves: Secondary | ICD-10-CM | POA: Diagnosis not present

## 2017-08-20 DIAGNOSIS — I350 Nonrheumatic aortic (valve) stenosis: Secondary | ICD-10-CM

## 2017-08-22 ENCOUNTER — Ambulatory Visit (INDEPENDENT_AMBULATORY_CARE_PROVIDER_SITE_OTHER): Payer: Medicare Other | Admitting: General Practice

## 2017-08-22 ENCOUNTER — Telehealth: Payer: Self-pay | Admitting: Internal Medicine

## 2017-08-22 DIAGNOSIS — Z7901 Long term (current) use of anticoagulants: Secondary | ICD-10-CM | POA: Diagnosis not present

## 2017-08-22 DIAGNOSIS — E538 Deficiency of other specified B group vitamins: Secondary | ICD-10-CM

## 2017-08-22 DIAGNOSIS — I4891 Unspecified atrial fibrillation: Secondary | ICD-10-CM

## 2017-08-22 LAB — POCT INR: INR: 2.8

## 2017-08-22 MED ORDER — CYANOCOBALAMIN 1000 MCG/ML IJ SOLN
1000.0000 ug | Freq: Once | INTRAMUSCULAR | Status: AC
  Administered 2017-08-22: 1000 ug via INTRAMUSCULAR

## 2017-08-22 NOTE — Progress Notes (Signed)
I have reviewed and agree with this plan  

## 2017-08-22 NOTE — Telephone Encounter (Signed)
New message ° ° ° °Patient calling for echo results. Please call °

## 2017-08-22 NOTE — Patient Instructions (Signed)
Pre visit review using our clinic review tool, if applicable. No additional management support is needed unless otherwise documented below in the visit note.  Continue to take 1 tablet daily except for 1/2  tablet on Monday/Wed/Fridays.  Re-check in 4 weeks.

## 2017-08-22 NOTE — Telephone Encounter (Signed)
Patient called w/results. Scheduled for echo f/up on 09/10/17

## 2017-09-03 ENCOUNTER — Other Ambulatory Visit: Payer: Self-pay | Admitting: Adult Health

## 2017-09-03 DIAGNOSIS — Z760 Encounter for issue of repeat prescription: Secondary | ICD-10-CM

## 2017-09-04 NOTE — Telephone Encounter (Signed)
Last OV: 05/02/17 Last filled: 07/31/17, #30, 0 RF Sig: TAKE (1) CAPSULE BY MOUTH AT BEDTIME  UDS: Not on file within last year

## 2017-09-05 ENCOUNTER — Encounter: Payer: Self-pay | Admitting: Adult Health

## 2017-09-05 ENCOUNTER — Ambulatory Visit (INDEPENDENT_AMBULATORY_CARE_PROVIDER_SITE_OTHER): Payer: Medicare Other | Admitting: Adult Health

## 2017-09-05 VITALS — BP 170/60 | Temp 97.4°F | Ht 60.75 in | Wt 122.0 lb

## 2017-09-05 DIAGNOSIS — Z780 Asymptomatic menopausal state: Secondary | ICD-10-CM

## 2017-09-05 DIAGNOSIS — N951 Menopausal and female climacteric states: Secondary | ICD-10-CM | POA: Diagnosis not present

## 2017-09-05 DIAGNOSIS — Z760 Encounter for issue of repeat prescription: Secondary | ICD-10-CM

## 2017-09-05 DIAGNOSIS — R2681 Unsteadiness on feet: Secondary | ICD-10-CM | POA: Diagnosis not present

## 2017-09-05 DIAGNOSIS — M150 Primary generalized (osteo)arthritis: Secondary | ICD-10-CM | POA: Diagnosis not present

## 2017-09-05 DIAGNOSIS — E782 Mixed hyperlipidemia: Secondary | ICD-10-CM

## 2017-09-05 DIAGNOSIS — M159 Polyosteoarthritis, unspecified: Secondary | ICD-10-CM

## 2017-09-05 DIAGNOSIS — I10 Essential (primary) hypertension: Secondary | ICD-10-CM | POA: Diagnosis not present

## 2017-09-05 LAB — BASIC METABOLIC PANEL
BUN: 11 mg/dL (ref 6–23)
CHLORIDE: 103 meq/L (ref 96–112)
CO2: 36 mEq/L — ABNORMAL HIGH (ref 19–32)
CREATININE: 0.84 mg/dL (ref 0.40–1.20)
Calcium: 9.4 mg/dL (ref 8.4–10.5)
GFR: 69.69 mL/min (ref >60.00)
Glucose, Bld: 141 mg/dL — ABNORMAL HIGH (ref 70–99)
Potassium: 4.3 mEq/L (ref 3.5–5.1)
Sodium: 143 mEq/L (ref 135–145)

## 2017-09-05 LAB — LIPID PANEL
CHOL/HDL RATIO: 3
Cholesterol: 182 mg/dL (ref 0–200)
HDL: 53.8 mg/dL (ref >39.00)
LDL CALC: 107 mg/dL — AB (ref 0–99)
NONHDL: 128.39
Triglycerides: 109 mg/dL (ref 0.0–149.0)
VLDL: 21.8 mg/dL (ref 0.0–40.0)

## 2017-09-05 LAB — CBC WITH DIFFERENTIAL/PLATELET
BASOS ABS: 0 10*3/uL (ref 0.0–0.1)
BASOS PCT: 0.5 % (ref 0.0–3.0)
Eosinophils Absolute: 0.2 10*3/uL (ref 0.0–0.7)
Eosinophils Relative: 4.2 % (ref 0.0–5.0)
HEMATOCRIT: 40.4 % (ref 36.0–46.0)
HEMOGLOBIN: 14 g/dL (ref 12.0–15.0)
LYMPHS PCT: 28.3 % (ref 12.0–46.0)
Lymphs Abs: 1.5 10*3/uL (ref 0.7–4.0)
MCHC: 34.5 g/dL (ref 30.0–36.0)
MCV: 87.4 fl (ref 78.0–100.0)
Monocytes Absolute: 0.4 10*3/uL (ref 0.1–1.0)
Monocytes Relative: 8.2 % (ref 3.0–12.0)
Neutro Abs: 3.1 10*3/uL (ref 1.4–7.7)
Neutrophils Relative %: 58.8 % (ref 43.0–77.0)
Platelets: 145 10*3/uL — ABNORMAL LOW (ref 150.0–400.0)
RBC: 4.63 Mil/uL (ref 3.87–5.11)
RDW: 13.4 % (ref 11.5–15.5)
WBC: 5.3 10*3/uL (ref 4.0–10.5)

## 2017-09-05 LAB — HEPATIC FUNCTION PANEL
ALK PHOS: 92 U/L (ref 39–117)
ALT: 12 U/L (ref 0–35)
AST: 13 U/L (ref 0–37)
Albumin: 4 g/dL (ref 3.5–5.2)
BILIRUBIN DIRECT: 0.1 mg/dL (ref 0.0–0.3)
TOTAL PROTEIN: 6.7 g/dL (ref 6.0–8.3)
Total Bilirubin: 0.6 mg/dL (ref 0.2–1.2)

## 2017-09-05 LAB — TSH: TSH: 1.18 u[IU]/mL (ref 0.35–4.50)

## 2017-09-05 MED ORDER — GABAPENTIN 300 MG PO CAPS: ORAL_CAPSULE | ORAL | 1 refills | Status: DC

## 2017-09-05 MED ORDER — ALBUTEROL SULFATE HFA 108 (90 BASE) MCG/ACT IN AERS: 2.0000 | INHALATION_SPRAY | Freq: Four times a day (QID) | RESPIRATORY_TRACT | 2 refills | Status: DC | PRN

## 2017-09-05 NOTE — Addendum Note (Signed)
Addended by: Apolinar Junes on: 09/05/2017 10:47 AM   Modules accepted: Orders

## 2017-09-05 NOTE — Progress Notes (Addendum)
Subjective:    Patient ID: [name], female    DOB: 1941-01-13, 77 y.o.   MRN: [id number]  HPI  Patient presents for yearly preventative medicine examination. She is a pleasant 77 year old female who  has a past medical history of Anemia (2005), Aortic stenosis, Arthritis, Broken back (2013), CAD (coronary artery disease), CHF (congestive heart failure) ([place]), Contrast media allergy, Diastolic heart failure ([place]), Esophageal cancer ([place]) (05/06/2015), GERD (gastroesophageal reflux disease), H/O iron deficiency, HH (hiatus hernia) (2008), Hypertension, Paroxysmal atrial fibrillation ([place]), PONV (postoperative nausea and vomiting), Pulmonary emboli ([place]) (2008, 2012), and Stroke [facility]) (1982).   Chronic Anti coagulation for atrial fibrillation and history of PE.[name] She is currently on coumadin.  CHF  -  She is being seen by Dr. Debara [name] for her cardiac issues. Her latest echo was 08/2017, which showed"   - Left ventricle: The cavity size was normal. There was mild focal   basal and mild concentric hypertrophy of the septum. Systolic   function was normal. The estimated ejection fraction was in the   range of 60% to 65%. Wall motion was normal; there were no   regional wall motion abnormalities. Features are consistent with   a pseudonormal left ventricular filling pattern, with concomitant   abnormal relaxation and increased filling pressure (grade 2   diastolic dysfunction). - Aortic valve: Valve mobility was restricted. There was moderate   stenosis. - Mitral valve: Severely calcified annulus. Mildly thickened   leaflets . The findings are consistent with trivial stenosis.   Valve area by pressure half-time: 2.19 cm^2. Valve area by   continuity equation (using LVOT flow): 1.98 cm^2. - Left atrium: The atrium was moderately dilated.   She had a left heart cath in 10/2016 for evaluation of angina which showed    There is hyperdynamic left ventricular systolic function.  The left  ventricular ejection fraction is greater than 65% by visual estimate.  LV end diastolic pressure is moderately elevated.  Moderate two-vessel disease: Ramus lesion, 60 %stenosed. RPDA lesion, 60 %stenosed.  There is mild aortic valve stenosis.  Hypertension - She takes Norvasc 10 mg, Coreg 12. 5mg  and Clonidine patch. She did not take her medication today  BP Readings from Last 3 Encounters:  09/05/17 (!) 170/60  08/06/17 (!) 155/56  05/02/17 (!) 144/60    GERD - Takes Protonix- stable   Most recently, she was seen by Oncology; who reports [name] remains in clinical remission from gastric cancer.  She has not been scheduled for follow-up surveillance at [facility]. We will make referral to Dr. Stephanie [name].   Her biggest complaint is that of worsening arthritis pain, especially in her hands and worse at night. She is on a low dose of Gabapentin 100 mg TID and is wondering if she can try going on this medication to see if it helps.   All immunizations and health maintenance protocols were reviewed with the patient and needed orders were placed. She refuses pneumonia and flu vaccination   Appropriate screening laboratory values were ordered for the patient including screening of hyperlipidemia, renal function and hepatic function.  Medication reconciliation,  past medical history, social history, problem list and allergies were reviewed in detail with the patient  Goals were established with regard to weight loss, exercise, and  diet in compliance with medications  End of life planning was discussed. She does not have an advanced directive of living will. She will be provided with information today   She is  due for her bone density screen and mammogram.   She has had one fall this year. She reports that she often feels unsteady on her feet. She would like to see PT for gait training.   Review of Systems  HENT: Negative.   Eyes: Negative.   Respiratory: Negative.   Cardiovascular:  Negative.   Gastrointestinal: Negative.   Endocrine: Negative.   Genitourinary: Negative.   Musculoskeletal: Positive for arthralgias and back pain.  Skin: Negative.   Allergic/Immunologic: Negative.   Neurological: Positive for weakness.  Hematological: Negative.   Psychiatric/Behavioral: Negative.   All other systems reviewed and are negative.  Past Medical History:  Diagnosis Date  . Anemia 2005   Generally microcytic, transfusions in [address], 2012, 02/2015, 05/2015  . Aortic stenosis    Moderate November 2017  . Arthritis   . Broken back 2013   Chronic back pain.   [name] CAD (coronary artery disease)    Cardiac catheterization 2014 - 80% mid RCA and 70% OM managed medically  . CHF (congestive heart failure) ([place])   . Contrast media allergy   . Diastolic heart failure ([place])   . Esophageal cancer ([place]) 05/06/2015   Adenocarcinoma GE junction  . GERD (gastroesophageal reflux disease)   . H/O iron deficiency    05-06-15 iron infusion ([facility])  . HH (hiatus hernia) 2008   Large with associated erosions  . Hypertension   . Paroxysmal atrial fibrillation ([name])   . PONV (postoperative nausea and vomiting)   . Pulmonary emboli ([place]) 2008, 2012  . Stroke [facility]) 1982    Social History   Socioeconomic History  . Marital status: Divorced    Spouse name: Not on file  . Number of children: 3  . Years of education: 31  . Highest education level: Not on file  Social Needs  . Financial resource strain: Not on file  . Food insecurity - worry: Not on file  . Food insecurity - inability: Not on file  . Transportation needs - medical: Not on file  . Transportation needs - non-medical: Not on file  Occupational History  . Occupation: Retired  Tobacco Use  . Smoking status: Never Smoker  . Smokeless tobacco: Never Used  Substance and Sexual Activity  . Alcohol use: No    Alcohol/week: 0.0 oz  . Drug use: No  . Sexual activity: Not Currently  Other Topics Concern  . Not on file    Social History Narrative   Born and raised in [place], [place]. Currently resides in a house with her son. 1 dog. Fun: go to church   Divorced(Has total of #3 children)-[place], [place], [place] Jun[place]   Denies religious beliefs that would effect health care.    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[employer]    Past Surgical History:  Procedure Laterality Date  . APPENDECTOMY  1950s  . BACK SURGERY    . CARPAL TUNNEL RELEASE Bilateral 1990s  . [place] SURGERY  2014  . CHOLECYSTECTOMY  1980s   open  . COLONOSCOPY N/A 02/17/2015   Procedure: COLONOSCOPY;  Surgeon: [name], MD;  Location: [facility] ENDOSCOPY;  Service: Endoscopy;  Laterality: N/A;  . ESOPHAGOGASTRODUODENOSCOPY N/A 02/16/2015   Procedure: ESOPHAGOGASTRODUODENOSCOPY (EGD);  Surgeon: [name], MD;  Location: [facility];  Service: Endoscopy;  Laterality: N/A;  . ESOPHAGOGASTRODUODENOSCOPY N/A 05/06/2015   Procedure: ESOPHAGOGASTRODUODENOSCOPY (EGD);  Surgeon: [name], MD;  Location[facility] END[facility]COPY;  Service: Endoscopy;  Laterality: N/A;  . EUS N/A 05/20/2015   Procedure: UPPER ENDOSCOPIC ULTRASOUND (EUS) LINEAR;  Surgeon: [name], MD;  Location: [facility] ENDOSCOPY;  Service: Endoscopy;  Laterality: N/A;  . exploratory lab  1950s or 1960s  . GIVENS CAPSULE STUDY N/A 05/06/2015   Procedure: GIVENS CAPSULE STUDY;  Surgeon: [name], MD;  Location[facility] ENDOSCOPY;  Service: Endoscopy;  Laterality: N/A;  . LEFT HEART CATH AND CORONARY ANGIOGRAPHY N/A 11/08/2016   Procedure: Left Heart Cath and Coronary Angiography;  Surgeon: [name], MD;  Location: [facility] CV LAB;  Service: Cardiovascular;  Laterality: N/A;  . lumbar back surgery  2012  . [place] SURGERY  1991  . TONSILLECTOMY    . TONSILLECTOMY AND ADENOIDECTOMY  1960s    Family History  Problem Relation Age of Onset  . Stroke Mother   . Heart disease Mother   . Emphysema Father    . Ovarian cancer Sister   . Stroke Sister   . Other Child        died at birth    Allergies  Allergen Reactions  . Iodinated Diagnostic Agents Anaphylaxis and Other (See Comments)    IPD dye Info given by patient  . Ioxaglate Anaphylaxis and Other (See Comments)    Info given by patient  . Milk-Related Compounds Anaphylaxis and Other (See Comments)    Lactose intolerance Can tolerate milk if its cooked into the recipe, just can't drink milk   . Red Dye Anaphylaxis  . Whey Other (See Comments)    Lactose intolerance  . Darvon [Propoxyphene] Rash  . Hydralazine Anxiety and Other (See Comments)    Facial flushing, pt prefers not to use it.     Current Outpatient Medications on File Prior to Visit  Medication Sig Dispense Refill  . acetaminophen (TYLENOL) 500 MG tablet Take 500-1,000 mg by mouth daily as needed for mild pain or moderate pain.     [name] amLODipine (NORVASC) 10 MG tablet Take 1 tablet (10 mg total) by mouth daily. 90 tablet 0  . aspirin EC 81 MG tablet Take 1 tablet (81 mg total) by mouth daily. 90 tablet 3  . carvedilol (COREG) 12.5 MG tablet TAKE (1) TABLET BY MOUTH TWICE DAILY. 180 tablet 3  . cloNIDine (CATAPRES - DOSED IN MG/24 HR) 0.2 mg/24hr patch Place 1 patch (0.2 mg total) onto the skin once a week. 4 patch 5  . cyanocobalamin (,VITAMIN B-12,) 1000 MCG/ML injection IM daily 11/29-12/3, then weekly x4, then monthly. (Patient taking differently: Inject 1,000 mcg into the muscle every 30 (thirty) days. ) 9 mL 0  . ferrous sulfate 325 (65 FE) MG tablet Take 325 mg by mouth daily with breakfast.     . furosemide (LASIX) 40 MG tablet Take 1 tablet (40 mg total) by mouth daily. 90 tablet 0  . meclizine (ANTIVERT) 25 MG tablet Take 25 mg by mouth as needed.    . pantoprazole (PROTONIX) 40 MG tablet TAKE 1 TABLET BY MOUTH TWICE DAILY. 180 tablet 1  . Pediatric Multivitamins-Iron (FLINTSTONES PLUS IRON) chewable tablet Chew 2 tablets by mouth daily. 60 tablet 0  .  simvastatin (ZOCOR) 20 MG tablet TAKE (1) TABLET BY MOUTH AT BEDTIME. 90 tablet 1  . temazepam (RESTORIL) 15 MG capsule TAKE (1) CAPSULE BY MOUTH AT BEDTIME. 30 capsule 2  . traMADol (ULTRAM) 50 MG tablet Take 1 tablet (50 mg total) by mouth every 8 (eight) hours. 90 tablet 0  . warfarin (COUMADIN)  5 MG tablet TAKE 1/2 TABLET BY MOUTH ON WEDNESDAY AND 1 TABLET ALL OTHER DAYS OR AS DIRECTED BY ANTICOAGULATION CLINIC. 90 tablet 1   No current facility-administered medications on file prior to visit.     BP (!) 170/60 (BP Location: Left Arm)   Temp (!) 97.4 F (36.3 C) (Oral)   Ht 5' 0.75" (1.543 m)   Wt 122 lb (55.3 kg)   BMI 23.24 kg/m       Objective:   Physical Exam  Constitutional: She is oriented to person, place, and time. She appears well-developed and well-nourished. No distress.  HENT:  Head: Normocephalic and atraumatic.  Right Ear: External ear normal.  Left Ear: External ear normal.  Nose: Nose normal.  Mouth/Throat: Oropharynx is clear and moist. No oropharyngeal exudate.  Eyes: Conjunctivae and EOM are normal. Pupils are equal, round, and reactive to light. Right eye exhibits no discharge. Left eye exhibits no discharge. No scleral icterus.  Neck: Normal range of motion. Neck supple. No JVD present. No tracheal deviation present. No thyromegaly present.  Cardiovascular: Normal rate, regular rhythm and intact distal pulses. Exam reveals no gallop and no friction rub.  Murmur (heard best at right sternal boarder ) heard. Pulmonary/Chest: Effort normal and breath sounds normal. No stridor. No respiratory distress. She has no wheezes. She has no rales. She exhibits no tenderness.  Abdominal: Soft. Bowel sounds are normal. She exhibits no distension and no mass. There is no tenderness. There is no rebound and no guarding.  Genitourinary:  Genitourinary Comments: Did not want breast exam    Musculoskeletal: Normal range of motion. She exhibits tenderness. She exhibits no  edema or deformity.  All fingers and mid/lower back  Walks with slow steady gait    Lymphadenopathy:    She has no cervical adenopathy.  Neurological: She is alert and oriented to person, place, and time. She has normal reflexes. She displays normal reflexes. No cranial nerve deficit. She exhibits normal muscle tone. Coordination normal.  Skin: Skin is warm and dry. No rash noted. She is not diaphoretic. No erythema. No pallor.  Psychiatric: She has a normal mood and affect. Her behavior is normal. Judgment and thought content normal.  Nursing note and vitals reviewed.     Assessment & Plan:  1. Medication refill  - albuterol (PROVENTIL HFA;VENTOLIN HFA) 108 (90 Base) MCG/ACT inhaler; Inhale 2 puffs into the lungs every 6 (six) hours as needed for wheezing or shortness of breath.  Dispense: 1 Inhaler; Refill: 2  2. Uncontrolled hypertension - Advised to go home and take BP medications  - Basic metabolic panel - CBC with Differential/Platelet - Hepatic function panel - Lipid panel - TSH  3. Primary osteoarthritis involving multiple joints - Will increase Neurontin from 100 mg to 300 mg TID. Side effects reviewed - Follow up if no improvement  - gabapentin (NEURONTIN) 300 MG capsule; TAKE 1 CAPSULE BY MOUTH THREE TIMES A DAY.  Dispense: 270 capsule; Refill: 1  4. Mixed hyperlipidemia - Consider increase in statin  - Basic metabolic panel - CBC with Differential/Platelet - Hepatic function panel - Lipid panel - TSH  5. Post-menopausal  - MM DIGITAL SCREENING BILATERAL; Future - DG Bone Density; Future  6. Gait instability - Fall precautions reviewed with patient . I would like her to star using her cane when ambulating.  - Ambulatory referral to Physical Therapy  Dorothyann Peng, NP

## 2017-09-05 NOTE — Patient Instructions (Signed)
It was great seeing you today   I have sent in your medications   Please let me know if the increase in Gabapentin is not working   Please call the breast center to make your appointment for mammogram and bone density screen   Phone: [phone]  I will follow up with you regarding your blood work

## 2017-09-05 NOTE — Progress Notes (Signed)
Subjective:    Patient ID: [name], female    DOB: 23-Aug-1940, 77 y.o.   MRN: [id number]  HPI Patient presents for yearly preventative medicine examination. She is doing well overall but is having an increased amount of pain and neuropathy in her hands.  She was recently seen by Dr. Benay Spice and he recommended an increase in her Neurontin for her arthritis. She has had a good report and follow-up from her oncologist.   All immunizations and health maintenance protocols were reviewed with the patient and needed orders were placed. She declines influenza and pneumovax 23 vaccines.    Appropriate screening laboratory values were ordered for the patient including screening of hyperlipidemia, renal function and hepatic function.  Medication reconciliation,  past medical history, social history, problem list and allergies were reviewed in detail with the patient.   Goals were established with regard to weight loss, exercise, and  diet in compliance with medications.  She does not have much of an appetite and struggles with making healthy choices.  She does have harder time with her recent dentures.  She is able to much in the way of exercise but is able to walk some.   End of life planning was discussed.  She is not sure if she has advanced directives.  Will send information home with her.     Review of Systems  Constitutional: Positive for appetite change. Negative for chills, fatigue, fever and unexpected weight change.       Weight loss over the past year but has gained 2 pounds in the past month.   HENT: Negative.   Respiratory: Positive for cough, shortness of breath and wheezing. Negative for chest tightness.   Cardiovascular: Positive for palpitations. Negative for chest pain and leg swelling.  Gastrointestinal: Positive for diarrhea. Negative for abdominal pain, constipation, nausea and vomiting.  Genitourinary: Negative.   Musculoskeletal: Positive for arthralgias and back  pain. Negative for gait problem, joint swelling and myalgias.  Neurological: Positive for dizziness and headaches.  Psychiatric/Behavioral: Negative.    Past Medical History:  Diagnosis Date  . Anemia 2005   Generally microcytic, transfusions in [address], 2012, 02/2015, 05/2015  . Aortic stenosis    Moderate November 2017  . Arthritis   . Broken back 2013   Chronic back pain.   [name] CAD (coronary artery disease)    Cardiac catheterization 2014 - 80% mid RCA and 70% OM managed medically  . CHF (congestive heart failure) ([facility])   . Contrast media allergy   . Diastolic heart failure ([facility])   . Esophageal cancer ([facility]) 05/06/2015   Adenocarcinoma GE junction  . GERD (gastroesophageal reflux disease)   . H/O iron deficiency    05-06-15 iron infusion ([facility])  . HH (hiatus hernia) 2008   Large with associated erosions  . Hypertension   . Paroxysmal atrial fibrillation ([name])   . PONV (postoperative nausea and vomiting)   . Pulmonary emboli ([place]) 2008, 2012  . Stroke [facility]) 1982    Social History   Socioeconomic History  . Marital status: Divorced    Spouse name: Not on file  . Number of children: 3  . Years of education: 30  . Highest education level: Not on file  Social Needs  . Financial resource strain: Not on file  . Food insecurity - worry: Not on file  . Food insecurity - inability: Not on file  . Transportation needs - medical: Not on file  . Transportation needs - non-medical: Not on  file  Occupational History  . Occupation: Retired  Tobacco Use  . Smoking status: Never Smoker  . Smokeless tobacco: Never Used  Substance and Sexual Activity  . Alcohol use: No    Alcohol/week: 0.0 oz  . Drug use: No  . Sexual activity: Not Currently  Other Topics Concern  . Not on file  Social History Narrative   Born and raised in [place], [place]. Currently resides in a house with her son. 1 dog. Fun: go to church   Divorced(Has total of #3 children)-[place], [place], [place] Junes    Denies religious beliefs that would effect health care.    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[employer]    Past Surgical History:  Procedure Laterality Date  . APPENDECTOMY  1950s  . BACK SURGERY    . CARPAL TUNNEL RELEASE Bilateral 1990s  . [place] SURGERY  2014  . CHOLECYSTECTOMY  1980s   open  . COLONOSCOPY N/A 02/17/2015   Procedure: COLONOSCOPY;  Surgeon: [name], MD;  Location: [facility] ENDOSCOPY;  Service: Endoscopy;  Laterality: N/A;  . ESOPHAGOGASTRODUODENOSCOPY N/A 02/16/2015   Procedure: ESOPHAGOGASTRODUODENOSCOPY (EGD);  Surgeon: [name], MD;  Location: [facility] ENDOSCOPY;  Service: Endoscopy;  Laterality: N/A;  . ESOPHAGOGASTRODUODENOSCOPY N/A 05/06/2015   Procedure: ESOPHAGOGASTRODUODENOSCOPY (EGD);  Surgeon: [name], MD;  Location: [facility] ENDOSCOPY;  Service: Endoscopy;  Laterality: N/A;  . EUS N/A 05/20/2015   Procedure: UPPER ENDOSCOPIC ULTRASOUND (EUS) LINEAR;  Surgeon: [name], MD;  Location: [facility] ENDOSCOPY;  Service: Endoscopy;  Laterality: N/A;  . exploratory lab  1950s or 1960s  . GIVENS CAPSULE STUDY N/A 05/06/2015   Procedure: GIVENS CAPSULE STUDY;  Surgeon: [name], MD;  Location: [facility] ENDOSCOPY;  Service: Endoscopy;  Laterality: N/A;  . LEFT HEART CATH AND CORONARY ANGIOGRAPHY N/A 11/08/2016   Procedure: Left Heart Cath and Coronary Angiography;  Surgeon: [name], MD;  Location: [facility] LAB;  Service: Cardiovascular;  Laterality: N/A;  . lumbar back surgery  2012  . [place] SURGERY  1991  . TONSILLECTOMY    . TONSILLECTOMY AND ADENOIDECTOMY  1960s    Family History  Problem Relation Age of Onset  . Stroke Mother   . Heart disease Mother   . Emphysema Father   . Ovarian cancer Sister   . Stroke Sister   . Other Child        died at birth    Allergies  Allergen Reactions  . Iodinated Diagnostic Agents Anaphylaxis and Other (See Comments)    IPD  dye Info given by patient  . Ioxaglate Anaphylaxis and Other (See Comments)    Info given by patient  . Milk-Related Compounds Anaphylaxis and Other (See Comments)    Lactose intolerance Can tolerate milk if its cooked into the recipe, just can't drink milk   . Red Dye Anaphylaxis  . Whey Other (See Comments)    Lactose intolerance  . Darvon [Propoxyphene] Rash  . Hydralazine Anxiety and Other (See Comments)    Facial flushing, pt prefers not to use it.     Current Outpatient Medications on File Prior to Visit  Medication Sig Dispense Refill  . acetaminophen (TYLENOL) 500 MG tablet Take 500-1,000 mg by mouth daily as needed for mild pain or moderate pain.     [name] amLODipine (NORVASC) 10 MG tablet Take 1 tablet (10 mg total) by mouth daily. 90 tablet 0  . aspirin EC  81 MG tablet Take 1 tablet (81 mg total) by mouth daily. 90 tablet 3  . carvedilol (COREG) 12.5 MG tablet TAKE (1) TABLET BY MOUTH TWICE DAILY. 180 tablet 3  . cloNIDine (CATAPRES - DOSED IN MG/24 HR) 0.2 mg/24hr patch Place 1 patch (0.2 mg total) onto the skin once a week. 4 patch 5  . cyanocobalamin (,VITAMIN B-12,) 1000 MCG/ML injection IM daily 11/29-12/3, then weekly x4, then monthly. (Patient taking differently: Inject 1,000 mcg into the muscle every 30 (thirty) days. ) 9 mL 0  . ferrous sulfate 325 (65 FE) MG tablet Take 325 mg by mouth daily with breakfast.     . furosemide (LASIX) 40 MG tablet Take 1 tablet (40 mg total) by mouth daily. 90 tablet 0  . gabapentin (NEURONTIN) 100 MG capsule TAKE 1 CAPSULE BY MOUTH THREE TIMES A DAY. 270 capsule 0  . meclizine (ANTIVERT) 25 MG tablet Take 25 mg by mouth as needed.    . pantoprazole (PROTONIX) 40 MG tablet TAKE 1 TABLET BY MOUTH TWICE DAILY. 180 tablet 1  . Pediatric Multivitamins-Iron (FLINTSTONES PLUS IRON) chewable tablet Chew 2 tablets by mouth daily. 60 tablet 0  . simvastatin (ZOCOR) 20 MG tablet TAKE (1) TABLET BY MOUTH AT BEDTIME. 90 tablet 1  . temazepam  (RESTORIL) 15 MG capsule TAKE (1) CAPSULE BY MOUTH AT BEDTIME. 30 capsule 2  . traMADol (ULTRAM) 50 MG tablet Take 1 tablet (50 mg total) by mouth every 8 (eight) hours. 90 tablet 0  . warfarin (COUMADIN) 5 MG tablet TAKE 1/2 TABLET BY MOUTH ON WEDNESDAY AND 1 TABLET ALL OTHER DAYS OR AS DIRECTED BY ANTICOAGULATION CLINIC. 90 tablet 1   No current facility-administered medications on file prior to visit.     BP (!) 170/60 (BP Location: Left Arm)   Temp (!) 97.4 F (36.3 C) (Oral)   Ht 5' 0.75" (1.543 m)   Wt 122 lb (55.3 kg)   BMI 23.24 kg/m       Objective:   Physical Exam  Constitutional: She is oriented to person, place, and time. She appears well-developed and well-nourished. No distress.  HENT:  Head: Normocephalic and atraumatic.  Left Ear: Tympanic membrane normal.  Nose: Nose normal. No mucosal edema. Right sinus exhibits no maxillary sinus tenderness and no frontal sinus tenderness. Left sinus exhibits no maxillary sinus tenderness and no frontal sinus tenderness.  Mouth/Throat: Uvula is midline, oropharynx is clear and moist and mucous membranes are normal. No oropharyngeal exudate, posterior oropharyngeal edema or posterior oropharyngeal erythema.  Right cerumen impaction,  Unable to visualize TM.   Eyes: Pupils are equal, round, and reactive to light. Right eye exhibits no discharge. Left eye exhibits no discharge.  Neck: Neck supple. No thyromegaly present.  Cardiovascular: Normal rate, regular rhythm and intact distal pulses.  Murmur heard. Grade 3 of 4 murmur heard best at RUSB  Pulmonary/Chest: Effort normal and breath sounds normal. No respiratory distress. She has no wheezes. She has no rales.  Abdominal: Soft. Bowel sounds are normal. She exhibits no distension and no mass. There is no hepatosplenomegaly. There is tenderness in the left lower quadrant. There is no rebound and no guarding.  Musculoskeletal: Normal range of motion. She exhibits edema. She exhibits no  tenderness.  Upper and lower extremity strength 4/5  Lymphadenopathy:    She has no cervical adenopathy.  Neurological: She is alert and oriented to person, place, and time. She displays normal reflexes. She exhibits normal muscle tone.  Skin: Skin  is warm and dry.  Psychiatric: She has a normal mood and affect. Her behavior is normal. Judgment and thought content normal.  Nursing note and vitals reviewed.     Assessment & Plan:  1. Medication refill Medications refills provided. - albuterol (PROVENTIL HFA;VENTOLIN HFA) 108 (90 Base) MCG/ACT inhaler; Inhale 2 puffs into the lungs every 6 (six) hours as needed for wheezing or shortness of breath.  Dispense: 1 Inhaler; Refill: 2  2. Uncontrolled hypertension BP is elevated today. Patient has not had BP meds.  Will consider increase or change in medications if continues to trend elevated.  - Basic metabolic panel - CBC with Differential/Platelet - Hepatic function panel - Lipid panel - TSH  3. Primary osteoarthritis involving multiple joints Discussed risks of increasing medication and falls/dizziness.  Have encouraged her to use her cane on a regular basis and will order PT for strengthening and gait/balance. - gabapentin (NEURONTIN) 300 MG capsule; TAKE 1 CAPSULE BY MOUTH THREE TIMES A DAY.  Dispense: 270 capsule; Refill: 1  4. Mixed hyperlipidemia Will check labs and notify of results.  - Basic metabolic panel - CBC with Differential/Platelet - Hepatic function panel - Lipid panel - TSH  5. Post-menopausal Need for screening mammogram.  - MM DIGITAL SCREENING BILATERAL; Future - DG Bone Density; Future

## 2017-09-06 ENCOUNTER — Other Ambulatory Visit (INDEPENDENT_AMBULATORY_CARE_PROVIDER_SITE_OTHER): Payer: Medicare Other

## 2017-09-06 DIAGNOSIS — R739 Hyperglycemia, unspecified: Secondary | ICD-10-CM | POA: Diagnosis not present

## 2017-09-06 LAB — HEMOGLOBIN A1C: HEMOGLOBIN A1C: 6.4 % (ref 4.6–6.5)

## 2017-09-10 ENCOUNTER — Ambulatory Visit (INDEPENDENT_AMBULATORY_CARE_PROVIDER_SITE_OTHER): Payer: Medicare Other | Admitting: Internal Medicine

## 2017-09-10 ENCOUNTER — Encounter: Payer: Self-pay | Admitting: Internal Medicine

## 2017-09-10 VITALS — BP 114/60 | HR 76 | Ht 60.75 in | Wt 118.6 lb

## 2017-09-10 DIAGNOSIS — I350 Nonrheumatic aortic (valve) stenosis: Secondary | ICD-10-CM

## 2017-09-10 DIAGNOSIS — E782 Mixed hyperlipidemia: Secondary | ICD-10-CM | POA: Diagnosis not present

## 2017-09-10 DIAGNOSIS — I480 Paroxysmal atrial fibrillation: Secondary | ICD-10-CM | POA: Diagnosis not present

## 2017-09-10 DIAGNOSIS — I10 Essential (primary) hypertension: Secondary | ICD-10-CM

## 2017-09-10 MED ORDER — EZETIMIBE-SIMVASTATIN 10-20 MG PO TABS: 1.0000 | ORAL_TABLET | Freq: Every day | ORAL | 6 refills | Status: DC

## 2017-09-10 NOTE — Progress Notes (Signed)
OFFICE NOTE  Chief Complaint:  Routine follow-up  Primary Care Physician: Dorothyann Peng, NP  HPI:  [name] is a pleasant 77 year old female kindly referred to me be Dr. Ardeth Perfect. She reports a past medical history significant for atrial fibrillation and congestive heart failure, which occurred around the same time and probably 2012. She vaguely recalls seeing Dr. Irish Lack at that time.  She was started on warfarin therapy and no attempts were made to get her back into rhythm as she was paroxysmal. She does has a long-standing history of hypertension and dyslipidemia. She apparently developed heart failure or however has never had reassessment of her ejection fraction. She subsequently was having health issues and decided to move with her son to live in [place]. She says at that time she had some problems with chest pain and ultimately underwent a stress test. This was associated with an anaphylactic type reaction and despite their reassurances she will "no longer have any more stress test". She says she has a severe allergy to IV contrast dye which causes throat swelling as well as milk which also causes the same symptoms. Apparently she also underwent cardiac catheterization however she says that only medical therapy was recommended. This was in [place], [place].  We are trying to request records from there as well. She subsequently moved with her son to [facility], however he lost his job and she recently moved back to [place]. Currently she denies any significant shortness of breath but does report some mild leg edema which is slightly worse and occasional pain in her left chest and left arm over the last several days. She's also had pain in the right chest. None of those symptoms are worse with exertion or relieved by rest or medication.   [name] returns today for followup. She again appears upset and quite nervous. Blood pressure recently has been significantly elevated. In  fact she went to the emergency room and was hospitalized for blood pressure control. Although it seems that her blood pressure more easily was controlled once her oral medicines were restarted. Subsequently she's been started on clonidine is now up to twice a day. Blood pressure today was still high at 182 systolic. She reports some occasional chest discomfort, mostly at rest and reports not being active enough to know if she has symptoms with exertion. She also has pain all over body significant low back pain and is seeing a specialist about that in the next few weeks.  I saw [name] back in the office today. She reports some improvement in her chest discomfort and I think this is related to blood pressure. The recent switch in her blood pressure medicines indicate a marked improvement in her pressures in she is noted to be 130/66 today. She does get some slight shortness of breath with exertion and no regular chest discomfort. At this time there is not clear evidence to pursue a further workup although I have a low threshold for further cardiac workup if she should become more symptomatic. She is in sinus rhythm and seems to be maintaining that.  [name] returns today for follow-up. She's had a remarkable past 6 months. Unfortunately she was diagnosed with a gastric cancer which was considered very high risk to resect. She was referred to [facility] for an endoscopic resection which was successful with clean margins. Prior to that she underwent cardiac evaluation by [facility] cardiology including a nuclear stress test which was low risk and didn't involve  exercise. She reports that during the last minute of exercise she went into A. fib. She's had recurrent A. fib on and off and in fact was just hospitalized again for A. fib a few weeks ago which converted spontaneously back to sinus rhythm, prior to cardiology is evaluating her. She was then directed to follow-up with me. She has fortunately been on  warfarin with therapeutic INRs. Of note, I did review her cardiac catheterization which was performed at an outside hospital 2014 which did demonstrate an 80% mid RCA stenosis as well as a 70% OM lesion and some mild to moderate disease of other vessels. At the time no stent was recommended, which leads me to believe these may be smaller vessels. The fact that her most recent stress test was again negative suggest that they are not flow limiting even though they would seem significant. Also, today she is describing left hand pain. She says that there is exquisite tenderness the left hand and it's difficult for her to extend her finger. She often notes that her left middle finger gets stuck. On palpation I do not notice any significant arthritis of the joints or nodules on the tendons.  04/19/2016  [name] returns today for follow-up. She is concerned about shortness of breath which is been progressive. She reports recently she's had some loss of vocal power and noted some upper airway wheezing. We and related with her on the office today and her oxygen saturations were noted to be 97% at rest and that came down to 95% with ambulation. EKG shows sinus rhythm at 66. Blood pressure is mildly elevated today. She has a nonproductive cough but denies fever, chills or sweats. Weight is up to 141 from 130 pounds 6 months ago.  07/11/2015  [name] returns today for follow-up. She seems to be doing fairly well. She denies any worsening shortness of breath or chest pain. She seems to be suffering from some acid reflux and will likely be undergoing another EGD with Dr. Benson Norway. She continues to have problems with voice changes which could be due to vocal cord inflammation related to her reflux.  09/21/2016  [name] is here today for follow-up. She had an INR check in the office. She is scheduled to have upcoming surgery for large hiatal hernia. Warm to [facility] by Dr. Precious Bard. Risk factors for surgery  including aortic stenosis, however this is recently assessed and is moderate. She denies any chest pain or anginal symptoms.  11/02/2016  [name] returns for follow-up today. Recently she's been having uncontrolled hypertension. 5 blood pressures been over 119 systolic. She's not had a history with difficult to control hypertension in the past. He's also had associated chest discomfort. The other day she had severe chest discomfort and she has had headaches. In 2013 she underwent cardia catheterization in [place] which showed some moderate coronary disease including what appears to be an 80% stenosis of the RCA however was not treated at the time. She also had proximal LAD disease which was mild to moderate. I'm concerned that she's had progression of her coronary artery disease as a possible cause of her uncontrolled hypertension. She reports the pain has occurred at rest but can occur with exertion. She also gets short of breath with exertion. She's felt more fatigue as well. The pain is described as sharp and nonradiating in the central chest. Unfortunate she's on warfarin for PAF and will need to hold that for 5 days prior to the  procedure to safely undergo cardiac catheterization. Although he recently cleared her for hernia repair, she never underwent the surgery as she was felt to be at higher risk. Given this new information I would not recommend hernia surgery in the near future until we have a better idea of her coronary arteries.  11/16/2016  [name] returns for follow-up. She underwent heart catheterization by Dr. Ellyn Hack on 11/08/2016 which showed hyperdynamic LVEF greater than 65%, there was a moderate 60% ramus lesion and 60% RPDA lesion which was not thought to be significant. There was mild aortic valvular stenosis. Blood pressure was elevated. Medical therapy including increased blood pressure control was recommended. Since discharge she has done well. She denied any competitions from the  right femoral access site. She's had one or 2 brief episodes of chest discomfort. Unfortunately after I saw her in the office at the last visit she fell in the parking lot by the [employer] caf[employer]ia. Ultimately she fractured her right radial styloid. That is now casted and in a sling. She does not anticipate the need for surgery, but she would be considered acceptable risk for surgery. Blood pressure is improved however not yet at goal.  02/19/2017  [name] is seen today in follow-up. She is doing well other than painful osteoarthritis. Her blood pressures been better controlled although she is in need of refills. She was found to have moderate aortic stenosis by last echo however was only noted to be mild by cardiac catheterization. She'll need a repeat echo in about 6 months.  09/10/2016  [name] returns today for follow-up.  She is under a lot of stress today.  Her sister is in the hospital and had a repeat heart surgery.  [name] his blood pressure is much better controlled today 114/60.  Overall she feels well and denies chest pain or worsening shortness of breath.  As noted she did have moderate coronary artery disease and even some severe distal coronary disease managed medically by heart cath in 2014.  Recently she had a lipid profile last week showing total cholesterol 182, triglycerides 109, HDL 54, LDL 107.  She is on simvastatin 20 mg.  She does have moderate aortic stenosis by echo in 05/2016.  PMHx:  Past Medical History:  Diagnosis Date  . Anemia 2005   Generally microcytic, transfusions in [address], 2012, 02/2015, 05/2015  . Aortic stenosis    Moderate November 2017  . Arthritis   . Broken back 2013   Chronic back pain.   [name] CAD (coronary artery disease)    Cardiac catheterization 2014 - 80% mid RCA and 70% OM managed medically  . CHF (congestive heart failure) ([place])   . Contrast media allergy   . Diastolic heart failure ([place])   . Esophageal cancer ([place]) 05/06/2015    Adenocarcinoma GE junction  . GERD (gastroesophageal reflux disease)   . H/O iron deficiency    05-06-15 iron infusion ([facility])  . HH (hiatus hernia) 2008   Large with associated erosions  . Hypertension   . Paroxysmal atrial fibrillation ([name])   . PONV (postoperative nausea and vomiting)   . Pulmonary emboli ([place]) 2008, 2012  . Stroke [facility]) 1982    Past Surgical History:  Procedure Laterality Date  . APPENDECTOMY  1950s  . BACK SURGERY    . CARPAL TUNNEL RELEASE Bilateral 1990s  . [employer] SURGERY  2014  . CHOLECYSTECTOMY  1980s   open  . COLONOSCOPY N/A 02/17/2015   Procedure: COLONOSCOPY;  Surgeon: [name], MD;  Location: [facility] ENDOSCOPY;  Service: Endoscopy;  Laterality: N/A;  . ESOPHAGOGASTRODUODENOSCOPY N/A 02/16/2015   Procedure: ESOPHAGOGASTRODUODENOSCOPY (EGD);  Surgeon: [name], MD;  Location: [facility] ENDOSCOPY;  Service: Endoscopy;  Laterality: N/A;  . ESOPHAGOGASTRODUODENOSCOPY N/A 05/06/2015   Procedure: ESOPHAGOGASTRODUODENOSCOPY (EGD);  Surgeon: [name], MD;  Location: [facility] ENDOSCOPY;  Service: Endoscopy;  Laterality: N/A;  . EUS N/A 05/20/2015   Procedure: UPPER ENDOSCOPIC ULTRASOUND (EUS) LINEAR;  Surgeon: [name], MD;  Location: [facility] ENDOSCOPY;  Service: Endoscopy;  Laterality: N/A;  . exploratory lab  1950s or 1960s  . GIVENS CAPSULE STUDY N/A 05/06/2015   Procedure: GIVENS CAPSULE STUDY;  Surgeon: [name], MD;  Location: [facility] ENDOSCOPY;  Service: Endoscopy;  Laterality: N/A;  . LEFT HEART CATH AND CORONARY ANGIOGRAPHY N/A 11/08/2016   Procedure: Left Heart Cath and Coronary Angiography;  Surgeon: [name], MD;  Location: [facility] CV LAB;  Service: Cardiovascular;  Laterality: N/A;  . lumbar back surgery  2012  . [place] SURGERY  1991  . TONSILLECTOMY    . TONSILLECTOMY AND ADENOIDECTOMY  1960s    FAMHx:  Family History  Problem Relation Age of Onset  . Stroke Mother   . Heart disease Mother   . Emphysema Father   .  Ovarian cancer Sister   . Stroke Sister   . Other Child        died at birth    SOCHx:   reports that  has never smoked. she has never used smokeless tobacco. She reports that she does not drink alcohol or use drugs.  ALLERGIES:  Allergies  Allergen Reactions  . Iodinated Diagnostic Agents Anaphylaxis and Other (See Comments)    IPD dye Info given by patient  . Ioxaglate Anaphylaxis and Other (See Comments)    Info given by patient  . Milk-Related Compounds Anaphylaxis and Other (See Comments)    Lactose intolerance Can tolerate milk if its cooked into the recipe, just can't drink milk   . Red Dye Anaphylaxis  . Whey Other (See Comments)    Lactose intolerance  . Darvon [Propoxyphene] Rash  . Hydralazine Anxiety and Other (See Comments)    Facial flushing, pt prefers not to use it.     ROS: Pertinent items noted in HPI and remainder of comprehensive ROS otherwise negative.  HOME MEDS: Current Outpatient Medications  Medication Sig Dispense Refill  . acetaminophen (TYLENOL) 500 MG tablet Take 500-1,000 mg by mouth daily as needed for mild pain or moderate pain.     [name] albuterol (PROVENTIL HFA;VENTOLIN HFA) 108 (90 Base) MCG/ACT inhaler Inhale 2 puffs into the lungs every 6 (six) hours as needed for wheezing or shortness of breath. 1 Inhaler 2  . amLODipine (NORVASC) 10 MG tablet Take 1 tablet (10 mg total) by mouth daily. 90 tablet 0  . aspirin EC 81 MG tablet Take 1 tablet (81 mg total) by mouth daily. 90 tablet 3  . carvedilol (COREG) 12.5 MG tablet TAKE (1) TABLET BY MOUTH TWICE DAILY. 180 tablet 3  . cloNIDine (CATAPRES - DOSED IN MG/24 HR) 0.2 mg/24hr patch Place 1 patch (0.2 mg total) onto the skin once a week. 4 patch 5  . cyanocobalamin (,VITAMIN B-12,) 1000 MCG/ML injection IM daily 11/29-12/3, then weekly x4, then monthly. (Patient taking differently: Inject 1,000 mcg into the muscle every 30 (thirty) days. ) 9 mL 0  . ferrous sulfate 325 (65 FE) MG tablet Take 325 mg  by mouth daily with breakfast.     . furosemide (LASIX) 40 MG tablet Take 1 tablet (40 mg total) by mouth daily. 90 tablet 0  . gabapentin (NEURONTIN) 300 MG capsule TAKE 1 CAPSULE BY MOUTH THREE TIMES A DAY. 270 capsule 1  . meclizine (ANTIVERT) 25 MG tablet Take 25 mg by mouth as needed.    . pantoprazole (PROTONIX) 40 MG tablet TAKE 1 TABLET BY MOUTH TWICE DAILY. 180 tablet 1  . Pediatric Multivitamins-Iron (FLINTSTONES PLUS IRON) chewable tablet Chew 2 tablets by mouth daily. 60 tablet 0  . simvastatin (ZOCOR) 20 MG tablet TAKE (1) TABLET BY MOUTH AT BEDTIME. 90 tablet 1  . temazepam (RESTORIL) 15 MG capsule TAKE (1) CAPSULE BY MOUTH AT BEDTIME. 30 capsule 2  . traMADol (ULTRAM) 50 MG tablet Take 1 tablet (50 mg total) by mouth every 8 (eight) hours. 90 tablet 0  . warfarin (COUMADIN) 5 MG tablet TAKE 1/2 TABLET BY MOUTH ON WEDNESDAY AND 1 TABLET ALL OTHER DAYS OR AS DIRECTED BY ANTICOAGULATION CLINIC. 90 tablet 1   No current facility-administered medications for this visit.     LABS/IMAGING: No results found for this or any previous visit (from the past 48 hour(s)). No results found.  VITALS: BP 114/60   Pulse 76   Ht 5' 0.75" (1.543 m)   Wt 118 lb 9.6 oz (53.8 kg)   BMI 22.59 kg/m   EXAM: General appearance: alert and no distress Neck: no carotid bruit and no JVD Lungs: clear to auscultation bilaterally Heart: irregularly irregular rhythm, S1, S2 normal and systolic murmur: midsystolic 3/6, crescendo at 2nd right intercostal space Abdomen: soft, non-tender; bowel sounds normal; no masses,  no organomegaly Extremities: normal Pulses: 2+ and symmetric Skin: Skin color, texture, turgor normal. No rashes or lesions Neurologic: Grossly normal Psych: Pleasant  EKG: A. fib at 76 -personally reviewed  ASSESSMENT: 1. Recent unstable angina - cath showed two-vessel moderate CAD and mild aortic stenosis (10/2016) 2. Uncontrolled hypertension 3. Paroxysmal atrial fibrillation on  warfarin 4. History of diastolic congestive heart failure - EF 60-65% on echo 5. Dyslipidemia 6. Moderate aortic stenosis - 05/2016 7. Right hand pain with contracture 8. Recent GI cancer/resected  PLAN: 1.   [name] has no complaints other than significant osteoarthritis.  She is under a lot of stress as expected with her sister in the hospital and she is concerned that she may lose her.  She denies any chest pain or worsening shortness of breath.  Her A. fib has been rate controlled and she has been therapeutic on warfarin.  Blood pressure is well controlled.  She does have now moderate aortic stenosis by echo in November 2017.  We will continue to follow that clinically.  Her LDL cholesterol remains above goal and has been slowly climbing.  She says her diet has been pretty stable and her weight is actually down a few pounds.  I would recommend adding ezetimibe to her current regimen.  We can provide this in the form of generic Vytorin combination which would not add any additional pills to her regimen.  Will prescribe the 20/10 mg pills and repeat a lipid profile in about 3 months.  Follow-up with me in 6 months.  Pixie Casino, MD, [facility], [place] Director of the Advanced Lipid Disorders &  Cardiovascular Risk Reduction Clinic Diplomate of the American Board of Clinical Lipidology Attending Cardiologist  Direct Dial: [phone]  Fax: [phone]  Website:  [email or web address] 09/10/2017, 9:48 AM

## 2017-09-10 NOTE — Patient Instructions (Signed)
Medication Instructions: Your physician recommends that you continue on your current medications as directed. Please refer to the Current Medication list given to you today.  STOP Simvastatin  START Ezetimibe-Simvastatin 10-20 mg daily.   Follow-Up: Your physician wants you to follow-up in: 6 months with Dr. Debara Pickett. You will receive a reminder letter in the mail two months in advance. If you don't receive a letter, please call our office to schedule the follow-up appointment.  If you need a refill on your cardiac medications before your next appointment, please call your pharmacy.

## 2017-09-15 ENCOUNTER — Other Ambulatory Visit: Payer: Self-pay | Admitting: Internal Medicine

## 2017-09-17 NOTE — Telephone Encounter (Signed)
REFILL 

## 2017-09-19 ENCOUNTER — Ambulatory Visit (INDEPENDENT_AMBULATORY_CARE_PROVIDER_SITE_OTHER): Payer: Medicare Other | Admitting: General Practice

## 2017-09-19 DIAGNOSIS — Z7901 Long term (current) use of anticoagulants: Secondary | ICD-10-CM

## 2017-09-19 DIAGNOSIS — E538 Deficiency of other specified B group vitamins: Secondary | ICD-10-CM | POA: Diagnosis not present

## 2017-09-19 DIAGNOSIS — I4891 Unspecified atrial fibrillation: Secondary | ICD-10-CM

## 2017-09-19 LAB — POCT INR: INR: 3

## 2017-09-19 MED ORDER — CYANOCOBALAMIN 1000 MCG/ML IJ SOLN
1000.0000 ug | Freq: Once | INTRAMUSCULAR | Status: AC
  Administered 2017-09-19: 1000 ug via INTRAMUSCULAR

## 2017-09-19 NOTE — Progress Notes (Signed)
I have reviewed and agree with this plan  

## 2017-09-19 NOTE — Patient Instructions (Addendum)
Pre visit review using our clinic review tool, if applicable. No additional management support is needed unless otherwise documented below in the visit note. 

## 2017-10-04 DIAGNOSIS — H25013 Cortical age-related cataract, bilateral: Secondary | ICD-10-CM | POA: Diagnosis not present

## 2017-10-04 DIAGNOSIS — H35033 Hypertensive retinopathy, bilateral: Secondary | ICD-10-CM | POA: Diagnosis not present

## 2017-10-04 DIAGNOSIS — H35041 Retinal micro-aneurysms, unspecified, right eye: Secondary | ICD-10-CM | POA: Diagnosis not present

## 2017-10-04 DIAGNOSIS — H2513 Age-related nuclear cataract, bilateral: Secondary | ICD-10-CM | POA: Diagnosis not present

## 2017-10-04 DIAGNOSIS — H353132 Nonexudative age-related macular degeneration, bilateral, intermediate dry stage: Secondary | ICD-10-CM | POA: Diagnosis not present

## 2017-10-10 DIAGNOSIS — N8111 Cystocele, midline: Secondary | ICD-10-CM | POA: Diagnosis not present

## 2017-10-15 DIAGNOSIS — N8111 Cystocele, midline: Secondary | ICD-10-CM | POA: Diagnosis not present

## 2017-10-16 DIAGNOSIS — H25812 Combined forms of age-related cataract, left eye: Secondary | ICD-10-CM | POA: Diagnosis not present

## 2017-10-16 DIAGNOSIS — H2512 Age-related nuclear cataract, left eye: Secondary | ICD-10-CM | POA: Diagnosis not present

## 2017-10-24 ENCOUNTER — Ambulatory Visit (INDEPENDENT_AMBULATORY_CARE_PROVIDER_SITE_OTHER): Payer: Medicare Other | Admitting: General Practice

## 2017-10-24 DIAGNOSIS — I4891 Unspecified atrial fibrillation: Secondary | ICD-10-CM

## 2017-10-24 DIAGNOSIS — Z7901 Long term (current) use of anticoagulants: Secondary | ICD-10-CM | POA: Diagnosis not present

## 2017-10-24 DIAGNOSIS — E538 Deficiency of other specified B group vitamins: Secondary | ICD-10-CM

## 2017-10-24 LAB — POCT INR: INR: 3.1

## 2017-10-24 MED ORDER — CYANOCOBALAMIN 1000 MCG/ML IJ SOLN
1000.0000 ug | Freq: Once | INTRAMUSCULAR | Status: AC
  Administered 2017-10-24: 1000 ug via INTRAMUSCULAR

## 2017-10-24 NOTE — Patient Instructions (Addendum)
Pre visit review using our clinic review tool, if applicable. No additional management support is needed unless otherwise documented below in the visit note.  Change dosage and take 1 tablet daily except for 1/2  tablet on Monday/Wed/Fridays/Saturdays.  Re-check in 4 weeks.

## 2017-10-24 NOTE — Progress Notes (Signed)
I have reviewed and agree with this plan  

## 2017-11-05 ENCOUNTER — Inpatient Hospital Stay: Payer: Medicare Other

## 2017-11-05 ENCOUNTER — Telehealth: Payer: Self-pay | Admitting: Oncology

## 2017-11-05 ENCOUNTER — Inpatient Hospital Stay: Payer: Medicare Other | Attending: Oncology | Admitting: Oncology

## 2017-11-05 VITALS — BP 153/50 | HR 61 | Temp 98.0°F | Resp 17 | Ht 60.75 in | Wt 119.2 lb

## 2017-11-05 DIAGNOSIS — I480 Paroxysmal atrial fibrillation: Secondary | ICD-10-CM | POA: Diagnosis not present

## 2017-11-05 DIAGNOSIS — Z8501 Personal history of malignant neoplasm of esophagus: Secondary | ICD-10-CM | POA: Diagnosis not present

## 2017-11-05 DIAGNOSIS — C160 Malignant neoplasm of cardia: Secondary | ICD-10-CM

## 2017-11-05 DIAGNOSIS — D649 Anemia, unspecified: Secondary | ICD-10-CM | POA: Diagnosis not present

## 2017-11-05 DIAGNOSIS — I10 Essential (primary) hypertension: Secondary | ICD-10-CM | POA: Diagnosis not present

## 2017-11-05 DIAGNOSIS — I350 Nonrheumatic aortic (valve) stenosis: Secondary | ICD-10-CM

## 2017-11-05 DIAGNOSIS — H2511 Age-related nuclear cataract, right eye: Secondary | ICD-10-CM | POA: Diagnosis not present

## 2017-11-05 DIAGNOSIS — K219 Gastro-esophageal reflux disease without esophagitis: Secondary | ICD-10-CM | POA: Diagnosis not present

## 2017-11-05 DIAGNOSIS — Z86711 Personal history of pulmonary embolism: Secondary | ICD-10-CM

## 2017-11-05 DIAGNOSIS — K449 Diaphragmatic hernia without obstruction or gangrene: Secondary | ICD-10-CM | POA: Diagnosis not present

## 2017-11-05 DIAGNOSIS — Z8673 Personal history of transient ischemic attack (TIA), and cerebral infarction without residual deficits: Secondary | ICD-10-CM | POA: Diagnosis not present

## 2017-11-05 DIAGNOSIS — H25011 Cortical age-related cataract, right eye: Secondary | ICD-10-CM | POA: Diagnosis not present

## 2017-11-05 LAB — CBC WITH DIFFERENTIAL (CANCER CENTER ONLY)
BASOS ABS: 0.1 10*3/uL (ref 0.0–0.1)
BASOS PCT: 1 %
EOS PCT: 3 %
Eosinophils Absolute: 0.2 10*3/uL (ref 0.0–0.5)
HEMATOCRIT: 41.4 % (ref 34.8–46.6)
Hemoglobin: 13.9 g/dL (ref 11.6–15.9)
LYMPHS PCT: 32 %
Lymphs Abs: 2 10*3/uL (ref 0.9–3.3)
MCH: 29.7 pg (ref 25.1–34.0)
MCHC: 33.6 g/dL (ref 31.5–36.0)
MCV: 88.4 fL (ref 79.5–101.0)
MONOS PCT: 9 %
Monocytes Absolute: 0.6 10*3/uL (ref 0.1–0.9)
Neutro Abs: 3.4 10*3/uL (ref 1.5–6.5)
Neutrophils Relative %: 55 %
PLATELETS: 168 10*3/uL (ref 145–400)
RBC: 4.68 MIL/uL (ref 3.70–5.45)
RDW: 13 % (ref 11.2–14.5)
WBC Count: 6.3 10*3/uL (ref 3.9–10.3)

## 2017-11-05 NOTE — Telephone Encounter (Signed)
Scheduled appt per 5/6 los - sent reminder letter in the mail - lab and f/u in 6 months - sent referral to HIM.

## 2017-11-05 NOTE — Progress Notes (Signed)
  [place] OFFICE PROGRESS NOTE   Diagnosis: Gastric cancer  INTERVAL HISTORY:   [name] returns as scheduled.  She is taking iron once daily.  No dysphagia.  No complaint.  She has not seen the GI service at [facility] for a follow-up endoscopy.  Objective:  Vital signs in last 24 hours:  Blood pressure (!) 153/50, pulse 61, temperature 98 F (36.7 C), temperature source Oral, resp. rate 17, height 5' 0.75" (1.543 m), weight 119 lb 3.2 oz (54.1 kg), SpO2 98 %.    HEENT: Neck without mass Lymphatics: No cervical, supraclavicular, or axillary nodes Resp: Distant breath sounds with end inspiratory rhonchi at the left base, no respiratory distress Cardio: Regular rate and rhythm GI: No hepatosplenomegaly, nontender, no mass Vascular: No leg edema      Lab Results:  Lab Results  Component Value Date   WBC 6.3 11/05/2017   HGB 13.9 11/05/2017   HCT 41.4 11/05/2017   MCV 88.4 11/05/2017   PLT 168 11/05/2017   NEUTROABS 3.4 11/05/2017     Medications: I have reviewed the patient's current medications.   Assessment/Plan: 1. 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 Large hiatal hernia. Scar in the cardia. Normal examined duodenum.  CT scans abdomen/pelvis 10/10/2016-nonspecific masslike wall thickening of the stomach. Evaluated by GI medicine at [facility]. CT findings felt to most likely be related to scarring from previous procedure.  2. History of recurrent anemia requiring red cell transfusions, last transfusion November 2017  3. Paroxysmal atrial fibrillation  4. History of pulmonary embolism  5. Gastroesophageal reflux disease  6. Aortic stenosis  7. Hypertension  8. History of a CVA  Disposition: [name] appears  well.  She is in clinical remission from the gastric cancer.  We will refer her to [facility] for a surveillance endoscopy.  She continues iron therapy.  She will return for an office visit and CBC in 6 months.  15 minutes were spent with the patient today.  The majority of the time was used for counseling and coordination of care.  Betsy Coder, MD  11/05/2017  12:36 PM

## 2017-11-12 ENCOUNTER — Other Ambulatory Visit: Payer: Self-pay | Admitting: Adult Health

## 2017-11-13 DIAGNOSIS — H2511 Age-related nuclear cataract, right eye: Secondary | ICD-10-CM | POA: Diagnosis not present

## 2017-11-13 DIAGNOSIS — H25811 Combined forms of age-related cataract, right eye: Secondary | ICD-10-CM | POA: Diagnosis not present

## 2017-11-13 NOTE — Telephone Encounter (Signed)
Sent to the pharmacy by e-scribe. 

## 2017-11-21 ENCOUNTER — Ambulatory Visit (INDEPENDENT_AMBULATORY_CARE_PROVIDER_SITE_OTHER): Payer: Medicare Other | Admitting: General Practice

## 2017-11-21 DIAGNOSIS — E538 Deficiency of other specified B group vitamins: Secondary | ICD-10-CM | POA: Diagnosis not present

## 2017-11-21 DIAGNOSIS — Z7901 Long term (current) use of anticoagulants: Secondary | ICD-10-CM

## 2017-11-21 DIAGNOSIS — I4891 Unspecified atrial fibrillation: Secondary | ICD-10-CM

## 2017-11-21 LAB — POCT INR: INR: 2.8 (ref 2.0–3.0)

## 2017-11-21 MED ORDER — CYANOCOBALAMIN 1000 MCG/ML IJ SOLN
1000.0000 ug | Freq: Once | INTRAMUSCULAR | Status: AC
  Administered 2017-11-21: 1000 ug via INTRAMUSCULAR

## 2017-11-21 NOTE — Progress Notes (Signed)
I have reviewed and agree with this plan  

## 2017-11-21 NOTE — Patient Instructions (Addendum)
Pre visit review using our clinic review tool, if applicable. No additional management support is needed unless otherwise documented below in the visit note.   Continue to take 1 tablet daily except for 1/2  tablet on Monday/Wed/Fridays/Saturdays.  Re-check in 4 weeks.

## 2017-11-30 ENCOUNTER — Encounter: Payer: Self-pay | Admitting: Adult Health

## 2017-11-30 ENCOUNTER — Ambulatory Visit (INDEPENDENT_AMBULATORY_CARE_PROVIDER_SITE_OTHER): Payer: Medicare Other | Admitting: Adult Health

## 2017-11-30 VITALS — BP 170/74 | Temp 98.1°F | Wt 118.0 lb

## 2017-11-30 DIAGNOSIS — S70261A Insect bite (nonvenomous), right hip, initial encounter: Secondary | ICD-10-CM | POA: Diagnosis not present

## 2017-11-30 DIAGNOSIS — W57XXXA Bitten or stung by nonvenomous insect and other nonvenomous arthropods, initial encounter: Secondary | ICD-10-CM

## 2017-11-30 MED ORDER — DOXYCYCLINE HYCLATE 100 MG PO CAPS: 100.0000 mg | ORAL_CAPSULE | Freq: Two times a day (BID) | ORAL | 0 refills | Status: DC

## 2017-11-30 NOTE — Progress Notes (Signed)
Subjective:    Patient ID: [name], female    DOB: March 04, 1941, 77 y.o.   MRN: [id number]  HPI  77 year old female who  has a past medical history of Anemia (2005), Aortic stenosis, Arthritis, Broken back (2013), CAD (coronary artery disease), CHF (congestive heart failure) ([place]), Contrast media allergy, Diastolic heart failure ([place]), Esophageal cancer ([place]) (05/06/2015), GERD (gastroesophageal reflux disease), H/O iron deficiency, HH (hiatus hernia) (2008), Hypertension, Paroxysmal atrial fibrillation ([place]), PONV (postoperative nausea and vomiting), Pulmonary emboli ([place]) (2008, 2012), and Stroke [facility]) (1982).  She presents to the office today for concern of tick bite to right hip. She first noticed 2 days ago. She is unsure of how long tick was attached. She was able to remove tick in it's entirety. Tick was not engorged   Reports redness and itching. Denies any fevers, body aches, or rashes  Review of Systems See HPI   Past Medical History:  Diagnosis Date  . Anemia 2005   Generally microcytic, transfusions in [address], 2012, 02/2015, 05/2015  . Aortic stenosis    Moderate November 2017  . Arthritis   . Broken back 2013   Chronic back pain.   [name] CAD (coronary artery disease)    Cardiac catheterization 2014 - 80% mid RCA and 70% OM managed medically  . CHF (congestive heart failure) ([place])   . Contrast media allergy   . Diastolic heart failure ([place])   . Esophageal cancer ([place]) 05/06/2015   Adenocarcinoma GE junction  . GERD (gastroesophageal reflux disease)   . H/O iron deficiency    05-06-15 iron infusion ([facility])  . HH (hiatus hernia) 2008   Large with associated erosions  . Hypertension   . Paroxysmal atrial fibrillation ([name])   . PONV (postoperative nausea and vomiting)   . Pulmonary emboli ([place]) 2008, 2012  . Stroke [facility]) 1982    Social History   Socioeconomic History  . Marital status: Divorced    Spouse name: Not on file  . Number of children: 3  . Years of  education: 4  . Highest education level: Not on file  Occupational History  . Occupation: Retired  Scientific laboratory technician  . Financial resource strain: Not on file  . Food insecurity:    Worry: Not on file    Inability: Not on file  . Transportation needs:    Medical: Not on file    Non-medical: Not on file  Tobacco Use  . Smoking status: Never Smoker  . Smokeless tobacco: Never Used  Substance and Sexual Activity  . Alcohol use: No    Alcohol/week: 0.0 oz  . Drug use: No  . Sexual activity: Not Currently  Lifestyle  . Physical activity:    Days per week: Not on file    Minutes per session: Not on file  . Stress: Not on file  Relationships  . Social connections:    Talks on phone: Not on file    Gets together: Not on file    Attends religious service: Not on file    Active member of club or organization: Not on file    Attends meetings of clubs or organizations: Not on file    Relationship status: Not on file  . Intimate partner violence:    Fear of current or ex partner: Not on file    Emotionally abused: Not on file    Physically abused: Not on file    Forced sexual activity: Not on file  Other Topics Concern  .  Not on file  Social History Narrative   Born and raised in [place], [place]. Currently resides in a house with her son. 1 dog. Fun: go to church   Divorced(Has total of #3 children)-[place], [place], [place]   Denies religious beliefs that would effect health care.    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[employer]    Past Surgical History:  Procedure Laterality Date  . APPENDECTOMY  1950s  . BACK SURGERY    . CARPAL TUNNEL RELEASE Bilateral 1990s  . [place] SURGERY  2014  . CHOLECYSTECTOMY  1980s   open  . COLONOSCOPY N/A 02/17/2015   Procedure: COLONOSCOPY;  Surgeon: [name], MD;  Location: [facility] ENDOSCOPY;  Service: Endoscopy;  Laterality: N/A;  . ESOPHAGOGASTRODUODENOSCOPY N/A  02/16/2015   Procedure: ESOPHAGOGASTRODUODENOSCOPY (EGD);  Surgeon: [name], MD;  Location: [facility];  Service: Endoscopy;  Laterality: N/A;  . ESOPHAGOGASTRODUODENOSCOPY N/A 05/06/2015   Procedure: ESOPHAGOGASTRODUODENOSCOPY (EGD);  Surgeon: [name], MD;  Location: [facility] ENDOSCOPY;  Service: Endoscopy;  Laterality: N/A;  . EUS N/A 05/20/2015   Procedure: UPPER ENDOSCOPIC ULTRASOUND (EUS) LINEAR;  Surgeon: [name], MD;  Location: [facility] ENDOSCOPY;  Service: Endoscopy;  Laterality: N/A;  . exploratory lab  1950s or 1960s  . GIVENS CAPSULE STUDY N/A 05/06/2015   Procedure: GIVENS CAPSULE STUDY;  Surgeon: [name], MD;  Location: [facility] ENDOSCOPY;  Service: Endoscopy;  Laterality: N/A;  . LEFT HEART CATH AND CORONARY ANGIOGRAPHY N/A 11/08/2016   Procedure: Left Heart Cath and Coronary Angiography;  Surgeon: [name], MD;  Location: [facility] LAB;  Service: Cardiovascular;  Laterality: N/A;  . lumbar back surgery  2012  . [place] SURGERY  1991  . TONSILLECTOMY    . TONSILLECTOMY AND ADENOIDECTOMY  1960s    Family History  Problem Relation Age of Onset  . Stroke Mother   . Heart disease Mother   . Emphysema Father   . Ovarian cancer Sister   . Stroke Sister   . Other Child        died at birth    Allergies  Allergen Reactions  . Iodinated Diagnostic Agents Anaphylaxis and Other (See Comments)    IPD dye Info given by patient  . Ioxaglate Anaphylaxis and Other (See Comments)    Info given by patient  . Milk-Related Compounds Anaphylaxis and Other (See Comments)    Lactose intolerance Can tolerate milk if its cooked into the recipe, just can't drink milk   . Red Dye Anaphylaxis  . Whey Other (See Comments)    Lactose intolerance  . Darvon [Propoxyphene] Rash  . Hydralazine Anxiety and Other (See Comments)    Facial flushing, pt prefers not to use it.     Current Outpatient Medications on File Prior to Visit  Medication Sig Dispense Refill  .  acetaminophen (TYLENOL) 500 MG tablet Take 500-1,000 mg by mouth daily as needed for mild pain or moderate pain.     [name] albuterol (PROVENTIL HFA;VENTOLIN HFA) 108 (90 Base) MCG/ACT inhaler Inhale 2 puffs into the lungs every 6 (six) hours as needed for wheezing or shortness of breath. 1 Inhaler 2  . amLODipine (NORVASC) 10 MG tablet Take 1 tablet (10 mg total) by mouth daily. 90 tablet 0  . aspirin EC 81 MG tablet Take 1 tablet (81 mg total) by mouth daily. 90 tablet 3  . budesonide-formoterol (SYMBICORT) 160-4.5 MCG/ACT inhaler Inhale into  the lungs.    . carvedilol (COREG) 12.5 MG tablet TAKE (1) TABLET BY MOUTH TWICE DAILY. 180 tablet 3  . cloNIDine (CATAPRES - DOSED IN MG/24 HR) 0.2 mg/24hr patch Place 1 patch (0.2 mg total) onto the skin once a week. 4 patch 5  . cloNIDine (CATAPRES - DOSED IN MG/24 HR) 0.2 mg/24hr patch APPLY 1 PATCH ONTO THE SKIN ONCE WEEKLY. 4 patch 6  . cyanocobalamin (,VITAMIN B-12,) 1000 MCG/ML injection IM daily 11/29-12/3, then weekly x4, then monthly. (Patient taking differently: Inject 1,000 mcg into the muscle every 30 (thirty) days. ) 9 mL 0  . diltiazem (CARDIZEM CD) 120 MG 24 hr capsule Take by mouth.    . ezetimibe-simvastatin (VYTORIN) 10-20 MG tablet Take 1 tablet by mouth daily. 30 tablet 6  . ferrous sulfate 325 (65 FE) MG tablet Take 325 mg by mouth daily with breakfast.     . furosemide (LASIX) 40 MG tablet TAKE 1 TABLET BY MOUTH ONCE DAILY. 90 tablet 1  . gabapentin (NEURONTIN) 300 MG capsule TAKE 1 CAPSULE BY MOUTH THREE TIMES A DAY. 270 capsule 1  . meclizine (ANTIVERT) 25 MG tablet Take 25 mg by mouth as needed.    . metoprolol tartrate (LOPRESSOR) 50 MG tablet Take by mouth.    . pantoprazole (PROTONIX) 40 MG tablet TAKE 1 TABLET BY MOUTH TWICE DAILY. 180 tablet 1  . Pediatric Multivitamins-Iron (FLINTSTONES PLUS IRON) chewable tablet Chew 2 tablets by mouth daily. 60 tablet 0  . simvastatin (ZOCOR) 20 MG tablet Take by mouth.    . temazepam  (RESTORIL) 15 MG capsule TAKE (1) CAPSULE BY MOUTH AT BEDTIME. 30 capsule 2  . traMADol (ULTRAM) 50 MG tablet Take 1 tablet (50 mg total) by mouth every 8 (eight) hours. 90 tablet 0  . warfarin (COUMADIN) 5 MG tablet TAKE 1/2 TABLET BY MOUTH ON WEDNESDAY AND 1 TABLET ALL OTHER DAYS OR AS DIRECTED BY ANTICOAGULATION CLINIC. 90 tablet 1   No current facility-administered medications on file prior to visit.     BP (!) 170/74   Temp 98.1 F (36.7 C) (Oral)   Wt 118 lb (53.5 kg)   BMI 22.48 kg/m       Objective:   Physical Exam  Constitutional: She is oriented to person, place, and time. She appears well-developed and well-nourished. No distress.  Cardiovascular: Exam reveals no gallop.  Neurological: She is alert and oriented to person, place, and time.  Skin: Skin is warm and dry. Capillary refill takes less than 2 seconds. She is not diaphoretic. There is erythema.  Tick bite noted on right hip. She does not have bullseye sign but does have small abscess forming. No remnants of tick noted.    Psychiatric: She has a normal mood and affect. Judgment and thought content normal.  Nursing note and vitals reviewed.     Assessment & Plan:  1. Tick bite of right hip, initial encounter - Will treat due to abscess.  - doxycycline (VIBRAMYCIN) 100 MG capsule; Take 1 capsule (100 mg total) by mouth 2 (two) times daily.  Dispense: 14 capsule; Refill: 0 - Return precautions given   Dorothyann Peng, AGNP

## 2017-12-05 ENCOUNTER — Other Ambulatory Visit: Payer: Self-pay | Admitting: Adult Health

## 2017-12-05 NOTE — Telephone Encounter (Signed)
Sent to the pharmacy by e-scribe. 

## 2017-12-06 ENCOUNTER — Other Ambulatory Visit: Payer: Self-pay | Admitting: Internal Medicine

## 2017-12-07 ENCOUNTER — Other Ambulatory Visit: Payer: Self-pay | Admitting: General Practice

## 2017-12-07 ENCOUNTER — Other Ambulatory Visit: Payer: Self-pay | Admitting: Adult Health

## 2017-12-07 MED ORDER — WARFARIN SODIUM 5 MG PO TABS: ORAL_TABLET | ORAL | 1 refills | Status: DC

## 2017-12-12 ENCOUNTER — Other Ambulatory Visit: Payer: Self-pay | Admitting: Adult Health

## 2017-12-12 DIAGNOSIS — Z760 Encounter for issue of repeat prescription: Secondary | ICD-10-CM

## 2017-12-19 ENCOUNTER — Encounter (HOSPITAL_COMMUNITY): Payer: Self-pay | Admitting: *Deleted

## 2017-12-19 ENCOUNTER — Emergency Department (HOSPITAL_COMMUNITY): Payer: Medicare Other

## 2017-12-19 ENCOUNTER — Observation Stay (HOSPITAL_COMMUNITY)
Admission: EM | Admit: 2017-12-19 | Discharge: 2017-12-21 | Disposition: A | Discharge status: Home / Self Care | Payer: Medicare Other | Attending: Internal Medicine | Admitting: Internal Medicine

## 2017-12-19 ENCOUNTER — Ambulatory Visit: Payer: Self-pay | Admitting: *Deleted

## 2017-12-19 ENCOUNTER — Ambulatory Visit: Payer: Medicare Other

## 2017-12-19 ENCOUNTER — Other Ambulatory Visit: Payer: Self-pay

## 2017-12-19 ENCOUNTER — Observation Stay (HOSPITAL_COMMUNITY): Payer: Medicare Other

## 2017-12-19 DIAGNOSIS — G8929 Other chronic pain: Secondary | ICD-10-CM | POA: Insufficient documentation

## 2017-12-19 DIAGNOSIS — R079 Chest pain, unspecified: Secondary | ICD-10-CM

## 2017-12-19 DIAGNOSIS — E782 Mixed hyperlipidemia: Secondary | ICD-10-CM | POA: Diagnosis present

## 2017-12-19 DIAGNOSIS — I2510 Atherosclerotic heart disease of native coronary artery without angina pectoris: Secondary | ICD-10-CM | POA: Diagnosis not present

## 2017-12-19 DIAGNOSIS — Z8501 Personal history of malignant neoplasm of esophagus: Secondary | ICD-10-CM | POA: Insufficient documentation

## 2017-12-19 DIAGNOSIS — G479 Sleep disorder, unspecified: Secondary | ICD-10-CM | POA: Diagnosis not present

## 2017-12-19 DIAGNOSIS — R740 Nonspecific elevation of levels of transaminase and lactic acid dehydrogenase [LDH]: Secondary | ICD-10-CM | POA: Diagnosis not present

## 2017-12-19 DIAGNOSIS — G629 Polyneuropathy, unspecified: Secondary | ICD-10-CM

## 2017-12-19 DIAGNOSIS — R0789 Other chest pain: Principal | ICD-10-CM | POA: Insufficient documentation

## 2017-12-19 DIAGNOSIS — K219 Gastro-esophageal reflux disease without esophagitis: Secondary | ICD-10-CM | POA: Diagnosis present

## 2017-12-19 DIAGNOSIS — I6782 Cerebral ischemia: Secondary | ICD-10-CM | POA: Diagnosis not present

## 2017-12-19 DIAGNOSIS — R200 Anesthesia of skin: Secondary | ICD-10-CM

## 2017-12-19 DIAGNOSIS — R0602 Shortness of breath: Secondary | ICD-10-CM | POA: Diagnosis not present

## 2017-12-19 DIAGNOSIS — Z91041 Radiographic dye allergy status: Secondary | ICD-10-CM | POA: Insufficient documentation

## 2017-12-19 DIAGNOSIS — I350 Nonrheumatic aortic (valve) stenosis: Secondary | ICD-10-CM | POA: Diagnosis not present

## 2017-12-19 DIAGNOSIS — I480 Paroxysmal atrial fibrillation: Secondary | ICD-10-CM | POA: Diagnosis present

## 2017-12-19 DIAGNOSIS — Z7982 Long term (current) use of aspirin: Secondary | ICD-10-CM | POA: Insufficient documentation

## 2017-12-19 DIAGNOSIS — I110 Hypertensive heart disease with heart failure: Secondary | ICD-10-CM | POA: Insufficient documentation

## 2017-12-19 DIAGNOSIS — M549 Dorsalgia, unspecified: Secondary | ICD-10-CM | POA: Diagnosis not present

## 2017-12-19 DIAGNOSIS — R072 Precordial pain: Secondary | ICD-10-CM | POA: Diagnosis not present

## 2017-12-19 DIAGNOSIS — M7989 Other specified soft tissue disorders: Secondary | ICD-10-CM | POA: Diagnosis not present

## 2017-12-19 DIAGNOSIS — I6522 Occlusion and stenosis of left carotid artery: Secondary | ICD-10-CM | POA: Diagnosis not present

## 2017-12-19 DIAGNOSIS — H8110 Benign paroxysmal vertigo, unspecified ear: Secondary | ICD-10-CM | POA: Diagnosis not present

## 2017-12-19 DIAGNOSIS — D509 Iron deficiency anemia, unspecified: Secondary | ICD-10-CM | POA: Insufficient documentation

## 2017-12-19 DIAGNOSIS — R739 Hyperglycemia, unspecified: Secondary | ICD-10-CM | POA: Diagnosis not present

## 2017-12-19 DIAGNOSIS — E538 Deficiency of other specified B group vitamins: Secondary | ICD-10-CM | POA: Insufficient documentation

## 2017-12-19 DIAGNOSIS — Z79899 Other long term (current) drug therapy: Secondary | ICD-10-CM | POA: Insufficient documentation

## 2017-12-19 DIAGNOSIS — I5032 Chronic diastolic (congestive) heart failure: Secondary | ICD-10-CM | POA: Diagnosis not present

## 2017-12-19 DIAGNOSIS — Z86711 Personal history of pulmonary embolism: Secondary | ICD-10-CM | POA: Diagnosis not present

## 2017-12-19 DIAGNOSIS — Z7901 Long term (current) use of anticoagulants: Secondary | ICD-10-CM | POA: Diagnosis not present

## 2017-12-19 DIAGNOSIS — I10 Essential (primary) hypertension: Secondary | ICD-10-CM | POA: Diagnosis present

## 2017-12-19 DIAGNOSIS — Z8673 Personal history of transient ischemic attack (TIA), and cerebral infarction without residual deficits: Secondary | ICD-10-CM | POA: Insufficient documentation

## 2017-12-19 DIAGNOSIS — Z79891 Long term (current) use of opiate analgesic: Secondary | ICD-10-CM | POA: Insufficient documentation

## 2017-12-19 DIAGNOSIS — I951 Orthostatic hypotension: Secondary | ICD-10-CM | POA: Diagnosis not present

## 2017-12-19 DIAGNOSIS — I5030 Unspecified diastolic (congestive) heart failure: Secondary | ICD-10-CM | POA: Diagnosis present

## 2017-12-19 LAB — BASIC METABOLIC PANEL
ANION GAP: 7 (ref 5–15)
BUN: 14 mg/dL (ref 6–20)
CALCIUM: 8.8 mg/dL — AB (ref 8.9–10.3)
CO2: 30 mmol/L (ref 22–32)
Chloride: 102 mmol/L (ref 101–111)
Creatinine, Ser: 0.76 mg/dL (ref 0.44–1.00)
Glucose, Bld: 121 mg/dL — ABNORMAL HIGH (ref 65–99)
POTASSIUM: 4.1 mmol/L (ref 3.5–5.1)
Sodium: 139 mmol/L (ref 135–145)

## 2017-12-19 LAB — CBC WITH DIFFERENTIAL/PLATELET
BASOS ABS: 0 10*3/uL (ref 0.0–0.1)
BASOS PCT: 0 %
EOS PCT: 4 %
Eosinophils Absolute: 0.2 10*3/uL (ref 0.0–0.7)
HEMATOCRIT: 38.7 % (ref 36.0–46.0)
Hemoglobin: 13.3 g/dL (ref 12.0–15.0)
LYMPHS PCT: 37 %
Lymphs Abs: 2.2 10*3/uL (ref 0.7–4.0)
MCH: 30 pg (ref 26.0–34.0)
MCHC: 34.4 g/dL (ref 30.0–36.0)
MCV: 87.4 fL (ref 78.0–100.0)
Monocytes Absolute: 0.5 10*3/uL (ref 0.1–1.0)
Monocytes Relative: 8 %
NEUTROS ABS: 3 10*3/uL (ref 1.7–7.7)
Neutrophils Relative %: 51 %
PLATELETS: 142 10*3/uL — AB (ref 150–400)
RBC: 4.43 MIL/uL (ref 3.87–5.11)
RDW: 12.4 % (ref 11.5–15.5)
WBC: 5.9 10*3/uL (ref 4.0–10.5)

## 2017-12-19 LAB — D-DIMER, QUANTITATIVE: D-Dimer, Quant: 0.7 ug/mL-FEU — ABNORMAL HIGH (ref 0.00–0.50)

## 2017-12-19 LAB — PROTIME-INR
INR: 2.06
Prothrombin Time: 23.1 seconds — ABNORMAL HIGH (ref 11.4–15.2)

## 2017-12-19 LAB — TROPONIN I

## 2017-12-19 LAB — MAGNESIUM: MAGNESIUM: 2 mg/dL (ref 1.7–2.4)

## 2017-12-19 LAB — GLUCOSE, CAPILLARY: GLUCOSE-CAPILLARY: 122 mg/dL — AB (ref 65–99)

## 2017-12-19 LAB — PHOSPHORUS: Phosphorus: 4.3 mg/dL (ref 2.5–4.6)

## 2017-12-19 MED ORDER — FAMOTIDINE IN NACL 20-0.9 MG/50ML-% IV SOLN: 20.0000 mg | Freq: Once | INTRAVENOUS | Status: DC

## 2017-12-19 MED ORDER — CLONIDINE HCL 0.2 MG/24HR TD PTWK
0.2000 mg | MEDICATED_PATCH | TRANSDERMAL | Status: DC
Start: 2017-12-23 — End: 2017-12-20

## 2017-12-19 MED ORDER — MECLIZINE HCL 12.5 MG PO TABS: 25.0000 mg | ORAL_TABLET | Freq: Three times a day (TID) | ORAL | Status: DC | PRN

## 2017-12-19 MED ORDER — TECHNETIUM TO 99M ALBUMIN AGGREGATED
4.0000 | Freq: Once | INTRAVENOUS | Status: AC | PRN
  Administered 2017-12-19: 4 via INTRAVENOUS

## 2017-12-19 MED ORDER — NITROGLYCERIN 0.4 MG SL SUBL
0.4000 mg | SUBLINGUAL_TABLET | SUBLINGUAL | Status: DC | PRN
  Administered 2017-12-19: 0.4 mg via SUBLINGUAL
  Filled 2017-12-19: qty 1

## 2017-12-19 MED ORDER — SODIUM CHLORIDE 0.9 % IV SOLN
INTRAVENOUS | Status: DC
  Administered 2017-12-19 – 2017-12-21 (×4): via INTRAVENOUS

## 2017-12-19 MED ORDER — EZETIMIBE-SIMVASTATIN 10-20 MG PO TABS
1.0000 | ORAL_TABLET | Freq: Every day | ORAL | Status: DC
  Filled 2017-12-19 (×3): qty 1

## 2017-12-19 MED ORDER — ASPIRIN EC 81 MG PO TBEC
81.0000 mg | DELAYED_RELEASE_TABLET | Freq: Every day | ORAL | Status: DC
  Administered 2017-12-19 – 2017-12-21 (×3): 81 mg via ORAL
  Filled 2017-12-19 (×3): qty 1

## 2017-12-19 MED ORDER — ONDANSETRON HCL 4 MG/2ML IJ SOLN: 4.0000 mg | Freq: Four times a day (QID) | INTRAMUSCULAR | Status: DC | PRN

## 2017-12-19 MED ORDER — FERROUS SULFATE 325 (65 FE) MG PO TABS
325.0000 mg | ORAL_TABLET | Freq: Every evening | ORAL | Status: DC
  Administered 2017-12-19 – 2017-12-20 (×2): 325 mg via ORAL
  Filled 2017-12-19 (×2): qty 1

## 2017-12-19 MED ORDER — CARVEDILOL 12.5 MG PO TABS
12.5000 mg | ORAL_TABLET | Freq: Two times a day (BID) | ORAL | Status: DC
  Administered 2017-12-20: 12.5 mg via ORAL
  Filled 2017-12-19: qty 1

## 2017-12-19 MED ORDER — MORPHINE SULFATE (PF) 2 MG/ML IV SOLN
1.0000 mg | Freq: Once | INTRAVENOUS | Status: AC
  Administered 2017-12-19: 1 mg via INTRAVENOUS
  Filled 2017-12-19: qty 1

## 2017-12-19 MED ORDER — ACETAMINOPHEN 325 MG PO TABS
650.0000 mg | ORAL_TABLET | Freq: Four times a day (QID) | ORAL | Status: DC | PRN
  Administered 2017-12-20 – 2017-12-21 (×2): 650 mg via ORAL
  Filled 2017-12-19 (×2): qty 2

## 2017-12-19 MED ORDER — PANTOPRAZOLE SODIUM 40 MG PO TBEC
40.0000 mg | DELAYED_RELEASE_TABLET | Freq: Two times a day (BID) | ORAL | Status: DC
  Administered 2017-12-19 – 2017-12-21 (×4): 40 mg via ORAL
  Filled 2017-12-19 (×4): qty 1

## 2017-12-19 MED ORDER — ONDANSETRON HCL 4 MG PO TABS
4.0000 mg | ORAL_TABLET | Freq: Four times a day (QID) | ORAL | Status: DC | PRN
  Administered 2017-12-19 – 2017-12-20 (×2): 4 mg via ORAL
  Filled 2017-12-19 (×2): qty 1

## 2017-12-19 MED ORDER — GABAPENTIN 300 MG PO CAPS
300.0000 mg | ORAL_CAPSULE | Freq: Every day | ORAL | Status: DC
  Administered 2017-12-19 – 2017-12-20 (×2): 300 mg via ORAL
  Filled 2017-12-19 (×2): qty 1

## 2017-12-19 MED ORDER — MORPHINE SULFATE (PF) 2 MG/ML IV SOLN: 1.0000 mg | INTRAVENOUS | Status: DC | PRN

## 2017-12-19 MED ORDER — FUROSEMIDE 40 MG PO TABS
40.0000 mg | ORAL_TABLET | Freq: Every day | ORAL | Status: DC
  Administered 2017-12-19 – 2017-12-20 (×2): 40 mg via ORAL
  Filled 2017-12-19 (×2): qty 1

## 2017-12-19 MED ORDER — ACETAMINOPHEN 650 MG RE SUPP: 650.0000 mg | Freq: Four times a day (QID) | RECTAL | Status: DC | PRN

## 2017-12-19 MED ORDER — AMLODIPINE BESYLATE 5 MG PO TABS
10.0000 mg | ORAL_TABLET | Freq: Every day | ORAL | Status: DC
  Administered 2017-12-19 – 2017-12-20 (×2): 10 mg via ORAL
  Filled 2017-12-19 (×2): qty 2

## 2017-12-19 MED ORDER — TEMAZEPAM 15 MG PO CAPS
15.0000 mg | ORAL_CAPSULE | Freq: Every evening | ORAL | Status: DC | PRN
  Administered 2017-12-19 – 2017-12-20 (×2): 15 mg via ORAL
  Filled 2017-12-19 (×2): qty 1

## 2017-12-19 MED ORDER — MORPHINE SULFATE (PF) 2 MG/ML IV SOLN
2.0000 mg | Freq: Once | INTRAVENOUS | Status: AC
  Administered 2017-12-19: 2 mg via INTRAVENOUS
  Filled 2017-12-19: qty 1

## 2017-12-19 MED ORDER — WARFARIN SODIUM 2.5 MG PO TABS
2.5000 mg | ORAL_TABLET | Freq: Once | ORAL | Status: AC
  Administered 2017-12-19: 2.5 mg via ORAL
  Filled 2017-12-19 (×2): qty 1

## 2017-12-19 MED ORDER — ALBUTEROL SULFATE (2.5 MG/3ML) 0.083% IN NEBU: 3.0000 mL | INHALATION_SOLUTION | Freq: Four times a day (QID) | RESPIRATORY_TRACT | Status: DC | PRN

## 2017-12-19 MED ORDER — TECHNETIUM TC 99M DIETHYLENETRIAME-PENTAACETIC ACID
30.0000 | Freq: Once | INTRAVENOUS | Status: AC | PRN
  Administered 2017-12-19: 33 via RESPIRATORY_TRACT

## 2017-12-19 MED ORDER — MORPHINE SULFATE (PF) 2 MG/ML IV SOLN
2.0000 mg | INTRAVENOUS | Status: DC | PRN
  Administered 2017-12-20 (×2): 2 mg via INTRAVENOUS
  Filled 2017-12-19 (×2): qty 1

## 2017-12-19 MED ORDER — MORPHINE SULFATE (PF) 2 MG/ML IV SOLN
INTRAVENOUS | Status: AC
  Administered 2017-12-19: 2 mg via INTRAVENOUS
  Filled 2017-12-19: qty 1

## 2017-12-19 MED ORDER — WARFARIN - PHARMACIST DOSING INPATIENT: Status: DC

## 2017-12-19 MED ORDER — ASPIRIN 81 MG PO CHEW
324.0000 mg | CHEWABLE_TABLET | Freq: Once | ORAL | Status: AC
  Administered 2017-12-19: 324 mg via ORAL
  Filled 2017-12-19: qty 4

## 2017-12-19 MED ORDER — ALPRAZOLAM 0.25 MG PO TABS
0.2500 mg | ORAL_TABLET | Freq: Two times a day (BID) | ORAL | Status: DC | PRN
  Administered 2017-12-19 – 2017-12-20 (×2): 0.25 mg via ORAL
  Filled 2017-12-19 (×2): qty 1

## 2017-12-19 MED ORDER — ONDANSETRON HCL 4 MG/2ML IJ SOLN
4.0000 mg | Freq: Once | INTRAMUSCULAR | Status: AC
  Administered 2017-12-19: 4 mg via INTRAVENOUS
  Filled 2017-12-19: qty 2

## 2017-12-19 NOTE — ED Notes (Signed)
Patient taken to nuclear medicine

## 2017-12-19 NOTE — Telephone Encounter (Signed)
Spoke with patient after discussing phone call with Opal Sidles RN with Triage.  Pt is refusing to go to ED for current symptoms. States that she wants to "curl up in her recliner and relax" Pt sounded very "tired" on the phone and was speaking in a slow drawn out tone and would not give much more than a 1-3 word answer to all my questions.  Pt having chest pain and pressure/heaviness, right facial numbness and feels like she is "floating".  Pt states that she does not want to go to the ED because she does not want to wait all day. I advised that she can call EMS to come check her out in her home and then they can give further advice. Pt was in the presence of her neighbor who is a Therapist, sports - she states that she feels the patient is in A-Fib.  I spoke with the neighbor and advised that she needed to be checked out by the ED and evaluated and she stated that the patient is not wanting to go but that she would see what she could do as far as convincing her to go. I explained the importance of her being evaluated given that she is on Coumadin and is having these classic signs of a stroke, neighbor again stated that she would "see what she could do". Phone number to the office given to the patient's neighbor and advised to please call once they decided what they were going to do.   Pt was made aware that Tommi Rumps is not in the office this afternoon but I will ensure that he is made aware of what is going first thing in the morning.   Will send to his nurse [facility] for follow up as well as Hoyle Sauer, Therapist, sports

## 2017-12-19 NOTE — Telephone Encounter (Signed)
Patient woke up and feels like she is floating. Chest heaviness and pain. R side of face is numb. Over all not feeling well- patient is slurring speech- she declines 911- she wants to call neighbor who is a Marine scientist. Patient called neighbor- I spoke with her and told her I had advised patient to call 911 with her symptoms- she is going to check her and call us back.  Reason for Disposition . Stroke suspected (e.g., sudden onset of weakness of the face, arm or leg on one side of the body)  Answer Assessment - Initial Assessment Questions 1. ACUTE COMPLAINT: "What is the main problem?" "Tell me what happened?"     Chest heaviness and pain- numbness in face 2. AIRWAY: "Is he / she breathing?" (Yes, No, Unknown)     *No Answer* 3. BREATHING: "Is there difficulty breathing?" (Yes, No, Unknown)     *No Answer* 4. CIRCULATION: "Is there any bleeding?" (Yes, No, Unknown)     *No Answer* 5. CONSCIOUS: "Is he/she awake, alert, and responding to you?" (Yes, No, Unknown)     *No Answer*  Protocols used: 911 [facility]

## 2017-12-19 NOTE — Telephone Encounter (Signed)
Patient currently in ED.

## 2017-12-19 NOTE — ED Triage Notes (Signed)
Pt with CP and sob that started this morning on right and left, states pressure and sharp at times. Pt on coumadin.

## 2017-12-19 NOTE — Telephone Encounter (Signed)
[name]- neighbor called- patient went to hospital- she has been admitted.

## 2017-12-19 NOTE — ED Provider Notes (Signed)
[facility] EMERGENCY DEPARTMENT Provider Note   CSN: [id number] Arrival date & time: 12/19/17  1439     History   Chief Complaint Chief Complaint  Patient presents with  . Chest Pain  . Shortness of Breath    HPI [name] is a 77 y.o. female.  Patient drove herself in for acute onset of substernal chest pain at 1 PM today.  Patient has not been feeling good all day.  Chest pain associated with some shortness of breath.  Pain not radiating.  Patient states pain is 6 out of 10.  She also noted upon awakening this morning that was not present when she went to bed last night at 9 PM was some right facial numbness.  No complaint of headache no other neurofocal deficits no speech problems.  Patient is on Coumadin.  Patient on Coumadin for atrial fibrillation.  Patient with known history of coronary artery disease but does has a history of CHF.  Patient had cardiac cath in 2014 with some coronary disease.  A cardiac cath in 2014 showed 80% mid RCA and 20% OM and they decided to manage medically.  In addition patient feels that she has had some increased swelling to her right leg and some pain behind her right knee for the several days.     Past Medical History:  Diagnosis Date  . Anemia 2005   Generally microcytic, transfusions in [address], 2012, 02/2015, 05/2015  . Aortic stenosis    Moderate November 2017  . Arthritis   . Broken back 2013   Chronic back pain.   [name] CAD (coronary artery disease)    Cardiac catheterization 2014 - 80% mid RCA and 70% OM managed medically  . CHF (congestive heart failure) ([place])   . Contrast media allergy   . Diastolic heart failure ([place])   . Esophageal cancer ([place]) 05/06/2015   Adenocarcinoma GE junction  . GERD (gastroesophageal reflux disease)   . H/O iron deficiency    05-06-15 iron infusion ([facility])  . HH (hiatus hernia) 2008   Large with associated erosions  . Hypertension   . Paroxysmal atrial fibrillation ([name])   . PONV (postoperative  nausea and vomiting)   . Pulmonary emboli ([place]) 2008, 2012  . Stroke [facility]) 1982    Patient Active Problem List   Diagnosis Date Noted  . Long term (current) use of anticoagulants 03/14/2017  . Numbness of face 01/09/2017  . Unstable angina ([place]) 11/02/2016  . Other fatigue 11/02/2016  . B12 deficiency 10/16/2016  . Preoperative cardiovascular examination 09/21/2016  . Hyperglycemia 06/26/2016  . Atrial fibrillation with RVR ([place]) 06/23/2016  . Encounter for medication review and counseling 06/07/2016  . Essential hypertension, benign 04/19/2016  . Aortic valve stenosis 09/28/2015  . 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 [place] lesion, acute   . IDA (iron deficiency anemia)   . Mixed hyperlipidemia 05/05/2015  . GERD (gastroesophageal reflux disease) 05/05/2015  . Absolute anemia   . Anticoagulated on Coumadin   . Osteoarthritis 04/16/2015  . History of pulmonary embolism 03/02/2015  . Shortness of breath 02/22/2015  . Benign neoplasm of descending colon 02/17/2015  . Diverticulosis of large intestine without diverticulitis 02/17/2015  . Esophageal stricture 02/16/2015  . UGI bleed 02/13/2015  . Supratherapeutic INR 02/13/2015  . Thoracic back pain 10/15/2014  . Macular degeneration 09/01/2014  . Benign paroxysmal positional vertigo   . Uncontrolled hypertension 08/24/2014  . Complaints of total body pain 08/03/2014  . Sleep disturbance 08/03/2014  .  PAF (paroxysmal atrial fibrillation) ([place]) 01/21/2014  . Long term current use of anticoagulant therapy 01/21/2014  . Chronic diastolic congestive heart failure ([place]) 01/21/2014  . Chest pain 01/21/2014    Past Surgical History:  Procedure Laterality Date  . APPENDECTOMY  1950s  . BACK SURGERY    . CARPAL TUNNEL RELEASE Bilateral 1990s  . [place] SURGERY  2014  . CHOLECYSTECTOMY  1980s   open  . COLONOSCOPY N/A 02/17/2015   Procedure: COLONOSCOPY;  Surgeon: [name], MD;  Location: [facility] ENDOSCOPY;   Service: Endoscopy;  Laterality: N/A;  . ESOPHAGOGASTRODUODENOSCOPY N/A 02/16/2015   Procedure: ESOPHAGOGASTRODUODENOSCOPY (EGD);  Surgeon: [name], MD;  Location: [facility];  Service: Endoscopy;  Laterality: N/A;  . ESOPHAGOGASTRODUODENOSCOPY N/A 05/06/2015   Procedure: ESOPHAGOGASTRODUODENOSCOPY (EGD);  Surgeon: [name], MD;  Location: [facility] ENDOSCOPY;  Service: Endoscopy;  Laterality: N/A;  . EUS N/A 05/20/2015   Procedure: UPPER ENDOSCOPIC ULTRASOUND (EUS) LINEAR;  Surgeon: [name], MD;  Location: [facility] ENDOSCOPY;  Service: Endoscopy;  Laterality: N/A;  . exploratory lab  1950s or 1960s  . GIVENS CAPSULE STUDY N/A 05/06/2015   Procedure: GIVENS CAPSULE STUDY;  Surgeon: [name], MD;  Location: [facility] END[facility]COPY;  Service: Endoscopy;  Laterality: N/A;  . LEFT HEART CATH AND CORONARY ANGIOGRAPHY N/A 11/08/2016   Procedure: Left Heart Cath and Coronary Angiography;  Surgeon: [name], MD;  Location: [facility] LAB;  Service: Cardiovascular;  Laterality: N/A;  . lumbar back surgery  2012  . [place] SURGERY  1991  . TONSILLECTOMY    . TONSILLECTOMY AND ADENOIDECTOMY  1960s     OB History   None      Home Medications    Prior to Admission medications   Medication Sig Start Date End Date Taking? Authorizing Provider  acetaminophen (TYLENOL) 500 MG tablet Take 500-1,000 mg by mouth daily as needed for mild pain or moderate pain.     [provider]  albuterol (PROVENTIL HFA;VENTOLIN HFA) 108 (90 Base) MCG/ACT inhaler Inhale 2 puffs into the lungs every 6 (six) hours as needed for wheezing or shortness of breath. 09/05/17   Nafziger, Tommi Rumps, NP  amLODipine (NORVASC) 10 MG tablet Take 1 tablet (10 mg total) by mouth daily. 05/04/17   Dorothyann Peng, NP  aspirin EC 81 MG tablet Take 1 tablet (81 mg total) by mouth daily. 11/02/16   Hilty, Nadean Corwin, MD  budesonide-formoterol (SYMBICORT) 160-4.5 MCG/ACT inhaler Inhale into the lungs. 06/22/16   [provider]  carvedilol (COREG) 12.5 MG tablet TAKE (1) TABLET BY MOUTH TWICE DAILY. 12/27/16   Hilty, Nadean Corwin, MD  cloNIDine (CATAPRES - DOSED IN MG/24 HR) 0.2 mg/24hr patch Place 1 patch (0.2 mg total) onto the skin once a week. 11/16/16   Hilty, Nadean Corwin, MD  cloNIDine (CATAPRES - DOSED IN MG/24 HR) 0.2 mg/24hr patch APPLY 1 PATCH ONTO THE SKIN ONCE WEEKLY. 09/17/17   Hilty, Nadean Corwin, MD  cyanocobalamin (,VITAMIN B-12,) 1000 MCG/ML injection IM daily 11/29-12/3, then weekly x4, then monthly. Patient taking differently: Inject 1,000 mcg into the muscle every 30 (thirty) days.  05/30/16   Samuella Cota, MD  diltiazem (CARDIZEM CD) 120 MG 24 hr capsule Take by mouth. 07/10/16   [provider]  doxycycline (VIBRAMYCIN) 100 MG capsule Take 1 capsule (100 mg total) by mouth 2 (two) times daily. 11/30/17   Nafziger, Tommi Rumps, NP  ezetimibe-simvastatin (VYTORIN) 10-20 MG tablet Take 1 tablet by mouth  daily. 09/10/17   Pixie Casino, MD  ferrous sulfate 325 (65 FE) MG tablet Take 325 mg by mouth daily with breakfast.     [provider]  furosemide (LASIX) 40 MG tablet TAKE 1 TABLET BY MOUTH ONCE DAILY. 11/13/17   Nafziger, Tommi Rumps, NP  gabapentin (NEURONTIN) 300 MG capsule TAKE 1 CAPSULE BY MOUTH THREE TIMES A DAY. 09/05/17   Nafziger, Tommi Rumps, NP  meclizine (ANTIVERT) 25 MG tablet Take 25 mg by mouth as needed. 08/28/14   [provider]  metoprolol tartrate (LOPRESSOR) 50 MG tablet Take by mouth.    [provider]  pantoprazole (PROTONIX) 40 MG tablet TAKE 1 TABLET BY MOUTH TWICE DAILY. 12/05/17   Dorothyann Peng, NP  Pediatric Multivitamins-Iron (FLINTSTONES PLUS IRON) chewable tablet Chew 2 tablets by mouth daily. 05/30/16   Samuella Cota, MD  simvastatin (ZOCOR) 20 MG tablet Take by mouth.    [provider]  temazepam (RESTORIL) 15 MG capsule TAKE (1) CAPSULE BY MOUTH AT BEDTIME. 12/12/17   Nafziger, Tommi Rumps, NP  traMADol (ULTRAM) 50 MG tablet Take 1 tablet  (50 mg total) by mouth every 8 (eight) hours. 05/04/17   Nafziger, Tommi Rumps, NP  warfarin (COUMADIN) 5 MG tablet TAKE 1/2 TABLET BY MOUTH ON WEDNESDAY AND 1 TABLET ALL OTHER DAYS OR AS DIRECTED BY ANTICOAGULATION CLINIC. 12/07/17   Dorothyann Peng, NP    Family History Family History  Problem Relation Age of Onset  . Stroke Mother   . Heart disease Mother   . Emphysema Father   . Ovarian cancer Sister   . Stroke Sister   . Other Child        died at birth    Social History Social History   Tobacco Use  . Smoking status: Never Smoker  . Smokeless tobacco: Never Used  Substance Use Topics  . Alcohol use: No    Alcohol/week: 0.0 oz  . Drug use: No     Allergies   Iodinated diagnostic agents; Ioxaglate; Milk-related compounds; Red dye; Whey; Darvon [propoxyphene]; and Hydralazine   Review of Systems Review of Systems  Constitutional: Negative for fever.  HENT: Negative for congestion.   Eyes: Negative for visual disturbance.  Respiratory: Positive for shortness of breath.   Cardiovascular: Positive for chest pain and leg swelling.  Gastrointestinal: Negative for abdominal pain, nausea and vomiting.  Genitourinary: Negative for dysuria.  Musculoskeletal: Negative for back pain.  Neurological: Positive for numbness. Negative for dizziness, syncope, facial asymmetry, speech difficulty, weakness and headaches.  Hematological: Bruises/bleeds easily.  Psychiatric/Behavioral: Negative for confusion.     Physical Exam Updated Vital Signs BP 132/65   Pulse 70   Temp 98.4 F (36.9 C) (Oral)   Resp 17   Ht 1.575 m (5\' 2" )   Wt 53.5 kg (118 lb)   SpO2 95%   BMI 21.58 kg/m   Physical Exam  Constitutional: She is oriented to person, place, and time. She appears well-developed and well-nourished. No distress.  HENT:  Head: Normocephalic and atraumatic.  Mouth/Throat: Oropharynx is clear and moist.  Eyes: Pupils are equal, round, and reactive to light. Conjunctivae and EOM are  normal.  Neck: Neck supple.  Cardiovascular: Normal rate.  Irregular  Pulmonary/Chest: Effort normal and breath sounds normal. No respiratory distress. She exhibits no tenderness.  Abdominal: Soft. Bowel sounds are normal.  Musculoskeletal: Normal range of motion. She exhibits edema. She exhibits no tenderness.  Slight increase swelling to the right leg compared to left.  Neurological: She  is alert and oriented to person, place, and time. No cranial nerve deficit or sensory deficit. She exhibits normal muscle tone. Coordination normal.  And except for right facial numbness  Nursing note and vitals reviewed.    ED Treatments / Results  Labs (all labs ordered are listed, but only abnormal results are displayed) Labs Reviewed  CBC WITH DIFFERENTIAL/PLATELET - Abnormal; Notable for the following components:      Result Value   Platelets 142 (*)    All other components within normal limits  BASIC METABOLIC PANEL - Abnormal; Notable for the following components:   Glucose, Bld 121 (*)    Calcium 8.8 (*)    All other components within normal limits  PROTIME-INR - Abnormal; Notable for the following components:   Prothrombin Time 23.1 (*)    All other components within normal limits  D-DIMER, QUANTITATIVE (NOT AT [facility]) - Abnormal; Notable for the following components:   D-Dimer, Quant 0.70 (*)    All other components within normal limits  TROPONIN I    EKG EKG Interpretation  Date/Time:  Wednesday December 19 2017 14:46:57 EDT Ventricular Rate:  83 PR Interval:    QRS Duration: 77 QT Interval:  378 QTC Calculation: 428 R Axis:   33 Text Interpretation:  Atrial fibrillation Low voltage, precordial leads Confirmed by Fredia Sorrow [phone]) on 12/19/2017 2:54:58 PM   Radiology Ct Head Wo Contrast  Result Date: 12/19/2017 CLINICAL DATA:  Chest pain and shortness of breath starting this morning. On Coumadin. EXAM: CT HEAD WITHOUT CONTRAST TECHNIQUE: Contiguous axial images were  obtained from the base of the skull through the vertex without intravenous contrast. COMPARISON:  Head CT dated 01/12/2017. FINDINGS: Brain: Ventricles are stable in size and configuration. Very mild chronic small vessel ischemic change noted within the bilateral periventricular white matter regions. Small old lacunar infarct is again noted within the RIGHT basal ganglia. Also again noted is an additional old infarct within the RIGHT cerebellum with associated encephalomalacia. There is no mass, hemorrhage, edema or other evidence of acute parenchymal abnormality. No extra-axial hemorrhage. Vascular: There are chronic calcified atherosclerotic changes of the large vessels at the skull base. No unexpected hyperdense vessel. Skull: Normal. Negative for fracture or focal lesion. Sinuses/Orbits: No acute finding. Other: None. IMPRESSION: 1. No acute findings.  No intracranial mass, hemorrhage or edema. 2. Chronic ischemic changes, as detailed above. Electronically Signed   By: Franki Cabot M.D.   On: 12/19/2017 16:48   Dg Chest Portable 1 View  Result Date: 12/19/2017 CLINICAL DATA:  Chest pain and shortness of breath EXAM: PORTABLE CHEST 1 VIEW COMPARISON:  11/02/2016 FINDINGS: Normal heart size. There is superimposed mediastinal density from a hiatal hernia. Stable aortic and hilar contours. Mild interstitial coarsening in the setting of lower lung volumes. No Kerley lines, air bronchogram, effusion, or pneumothorax. IMPRESSION: 1. No acute finding. 2. Hiatal hernia. Electronically Signed   By: Monte Fantasia M.D.   On: 12/19/2017 15:19    Procedures Procedures (including critical care time)  Medications Ordered in ED Medications  nitroGLYCERIN (NITROSTAT) SL tablet 0.4 mg (0.4 mg Sublingual Given 12/19/17 1548)  0.9 %  sodium chloride infusion ( Intravenous New Bag/Given 12/19/17 1547)  morphine 2 MG/ML injection 1 mg (has no administration in time range)  aspirin chewable tablet 324 mg (324 mg Oral  Given 12/19/17 1544)  ondansetron (ZOFRAN) injection 4 mg (4 mg Intravenous Given 12/19/17 1547)     Initial Impression / Assessment and Plan / ED Course  I have reviewed the triage vital signs and the nursing notes.  Pertinent labs & imaging results that were available during my care of the patient were reviewed by me and considered in my medical decision making (see chart for details).    Work-up here concentrated on the chest pain.  Initial troponin negative but pain onset was just at 1 PM today.  D-dimer slightly elevated.  Patient is on Coumadin but still some risk for pulmonary embolus with the shortness of breath and the right leg swelling and some pain behind her right leg.  Patient treated here with aspirin and nitroglycerin without any change in the pain.  Remains 6 out of 10.  Patient given 1 mg of morphine to help control the pain.  Patient has an allergy to IV contrast so CT angios is not a possibility.  Patient will probably need VQ scan.  We will try to get ultrasound of the right lower extremity to rule out DVT there.  CT head was done for the right facial numbness.  With patient having atrial fibrillation certainly CVA is a possibility.  Patient will probably need MR as well.  But patient requires admission for the chest pain just by itself.  So proceeded with admission discussed with hospitalist they will admit.  Initial troponin was negative chest x-ray negative as mentioned d-dimer was elevated labs without other significant abnormalities.  Patient's oxygen saturation on room air is normal.   Final Clinical Impressions(s) / ED Diagnoses   Final diagnoses:  Precordial pain  Right facial numbness    ED Discharge Orders    None       Fredia Sorrow, MD 12/19/17 1739

## 2017-12-19 NOTE — Progress Notes (Signed)
ANTICOAGULATION CONSULT NOTE - Initial Consult  Pharmacy Consult for Warfarin Indication: atrial fibrillation  Allergies  Allergen Reactions  . Iodinated Diagnostic Agents Anaphylaxis and Other (See Comments)    IPD dye Info given by patient  . Ioxaglate Anaphylaxis and Other (See Comments)    Info given by patient  . Milk-Related Compounds Anaphylaxis and Other (See Comments)    Lactose intolerance Can tolerate milk if its cooked into the recipe, just can't drink milk   . Red Dye Anaphylaxis  . Whey Other (See Comments)    Lactose intolerance  . Darvon [Propoxyphene] Rash  . Hydralazine Anxiety and Other (See Comments)    Facial flushing, pt prefers not to use it.    Patient Measurements: Height: 5\' 2"  (157.5 cm) Weight: 118 lb (53.5 kg) IBW/kg (Calculated) : 50.1 Heparin Dosing Weight:   Vital Signs: Temp: 98.4 F (36.9 C) (06/19 1449) Temp Source: Oral (06/19 1449) BP: 131/60 (06/19 1830) Pulse Rate: 80 (06/19 1830)  Labs: Recent Labs    12/19/17 1508  HGB 13.3  HCT 38.7  PLT 142*  LABPROT 23.1*  INR 2.06  CREATININE 0.76  TROPONINI <0.03   Estimated Creatinine Clearance: 45.8 mL/min (by C-G formula based on SCr of 0.76 mg/dL).  Medical History: Past Medical History:  Diagnosis Date  . Anemia 2005   Generally microcytic, transfusions in [address], 2012, 02/2015, 05/2015  . Aortic stenosis    Moderate November 2017  . Arthritis   . Broken back 2013   Chronic back pain.   [name] CAD (coronary artery disease)    Cardiac catheterization 2014 - 80% mid RCA and 70% OM managed medically  . CHF (congestive heart failure) ([place])   . Contrast media allergy   . Diastolic heart failure ([place])   . Esophageal cancer ([place]) 05/06/2015   Adenocarcinoma GE junction  . GERD (gastroesophageal reflux disease)   . H/O iron deficiency    05-06-15 iron infusion ([facility])  . HH (hiatus hernia) 2008   Large with associated erosions  . Hypertension   . Paroxysmal atrial fibrillation  ([name])   . PONV (postoperative nausea and vomiting)   . Pulmonary emboli ([place]) 2008, 2012  . Stroke [facility]) 1982   Medications:   (Not in a hospital admission) Home Warfarin 2.5mg  M-W-F-Sat                               5mg  Tue-Thur-Sun  Assessment: Okay for Protocol, INR at goal. No bleeding noted.  Last Warfarin dose 6/18 per home medication list.  Goal of Therapy:  INR 2-3 Monitor platelets by anticoagulation protocol: Yes   Plan:  Warfarin 2.5mg  PO x 1 Daily PT / INR Monitor for signs and symptoms of bleeding.   Biagio Quint R 12/19/2017,6:50 PM

## 2017-12-19 NOTE — Telephone Encounter (Signed)
Called patient and left message to return call for follow-up.

## 2017-12-19 NOTE — H&P (Signed)
History and Physical    [name] WFU:[id number] DOB: 17-Jul-1940 DOA: 12/19/2017  PCP: Miranda Peng, NP   Patient coming from: Home.  I have personally briefly reviewed patient's old medical records in [place]  Chief Complaint: Chest pain.  HPI: [name] is a 77 y.o. female with medical history significant of previous anemia, aortic stenosis, osteoarthritis, chronic back pain, coronary artery disease, chronic diastolic CHF, history of esophageal cancer, GERD, hiatal hernia with a ructions, history of CVA, history of paroxysmal atrial fibrillation, history of pulmonary emboli x2 who is coming to the emergency department due to chest pain as of breath since about 1300.  She describes the pain as a sudden onset, precordial pressure, nonradiating, with occasional sharp exacerbations..  She also has some epigastric discomfort, but this denies feeling dyspepsia after lunch, which she had already had when the pain started.  The pain worsens with deep inspiration.  Associated symptoms are mild dyspnea and mild nausea.  No palpitations, diaphoresis, PND or orthopnea.  She gets frequent lower extremity pitting edema.  complains of epigastric abdominal pain, which she did notice while I was examining her.  No vomiting, diarrhea, constipation, melena or hematochezia.  No dysuria, frequency or hematuria.  Denies polyuria, polydipsia, polyphagia, or blurred vision.  She also mentioned having right facial numbness when she woke up this morning.  The numbness have persisted through the day and she still has it although not as intense as it was earlier.  She denies dizziness, slurred speech, confusion, language comprehension or expressive aphasia, blurred vision, but states that she has been mildly lightheaded since earlier in the morning.  She took a gabapentin 300 mg this morning for musculoskeletal pain pain, but does not feel that this caused her chest pain or GI symptoms.  ED Course:  Initial vital signs temperature 98.4 F, pulse 82, respirations 18, blood pressure 180/72 mmHg and O2 sat 96% on room air.    Medications and ED: She received aspirin 324 mg p.o., sublingual nitroglycerin and morphine 1 mg IVP with partial relief of symptoms.   Lab work: White count is 5.9 with a normal differential, hemoglobin 13.3 g/dL and platelets 142.  PT 23.1 seconds and INR 2.06.  Dimer 0.70 mcg/mL-FEU.  EKG show atrial fibrillation at 83 bpm.  Troponin was less than 0.03 ng/mL.Sodium 139, potassium 4.1, chloride 102 and CO2 30 mmol S/L.  BUN 14, creatinine 0.76, calcium 8.8 and glucose 121 mg/dL.  Imaging: A 1 view chest radiograph showed normal heart size and hiatal hernia.  There was no acute finding.  CT head without contrast did not show any acute findings.  However they were previous all infarcts and chronic ischemic changes.  Please see images and full radiology report for further detail.  Review of Systems: As per HPI otherwise 10 point review of systems negative.   Past Medical History:  Diagnosis Date  . Anemia 2005   Generally microcytic, transfusions in [address], 2012, 02/2015, 05/2015  . Aortic stenosis    Moderate November 2017  . Arthritis   . Broken back 2013   Chronic back pain.   [name] CAD (coronary artery disease)    Cardiac catheterization 2014 - 80% mid RCA and 70% OM managed medically  . CHF (congestive heart failure) ([place])   . Contrast media allergy   . Diastolic heart failure ([place])   . Esophageal cancer ([place]) 05/06/2015   Adenocarcinoma GE junction  . GERD (gastroesophageal reflux disease)   . H/O iron  deficiency    05-06-15 iron infusion ([facility])  . HH (hiatus hernia) 2008   Large with associated erosions  . Hypertension   . Paroxysmal atrial fibrillation ([name])   . PONV (postoperative nausea and vomiting)   . Pulmonary emboli ([place]) 2008, 2012  . Stroke [facility]) 1982    Past Surgical History:  Procedure Laterality Date  . APPENDECTOMY  1950s  . BACK SURGERY     . CARPAL TUNNEL RELEASE Bilateral 1990s  . [place] SURGERY  2014  . CHOLECYSTECTOMY  1980s   open  . COLONOSCOPY N/A 02/17/2015   Procedure: COLONOSCOPY;  Surgeon: [name], MD;  Location: [facility] ENDOSCOPY;  Service: Endoscopy;  Laterality: N/A;  . ESOPHAGOGASTRODUODENOSCOPY N/A 02/16/2015   Procedure: ESOPHAGOGASTRODUODENOSCOPY (EGD);  Surgeon: [name], MD;  Location: [facility] ENDOSCOPY;  Service: Endoscopy;  Laterality: N/A;  . ESOPHAGOGASTRODUODENOSCOPY N/A 05/06/2015   Procedure: ESOPHAGOGASTRODUODENOSCOPY (EGD);  Surgeon: [name], MD;  Location: [facility] ENDOSCOPY;  Service: Endoscopy;  Laterality: N/A;  . EUS N/A 05/20/2015   Procedure: UPPER ENDOSCOPIC ULTRASOUND (EUS) LINEAR;  Surgeon: [name], MD;  Location: [facility] ENDOSCOPY;  Service: Endoscopy;  Laterality: N/A;  . exploratory lab  1950s or 1960s  . GIVENS CAPSULE STUDY N/A 05/06/2015   Procedure: GIVENS CAPSULE STUDY;  Surgeon: [name], MD;  Location: [facility] ENDOSCOPY;  Service: Endoscopy;  Laterality: N/A;  . LEFT HEART CATH AND CORONARY ANGIOGRAPHY N/A 11/08/2016   Procedure: Left Heart Cath and Coronary Angiography;  Surgeon: [name], MD;  Location: [facility] CV LAB;  Service: Cardiovascular;  Laterality: N/A;  . lumbar back surgery  2012  . [place] SURGERY  1991  . TONSILLECTOMY    . TONSILLECTOMY AND ADENOIDECTOMY  1960s     reports that she has never smoked. She has never used smokeless tobacco. She reports that she does not drink alcohol or use drugs.  Allergies  Allergen Reactions  . Iodinated Diagnostic Agents Anaphylaxis and Other (See Comments)    IPD dye Info given by patient  . Ioxaglate Anaphylaxis and Other (See Comments)    Info given by patient  . Milk-Related Compounds Anaphylaxis and Other (See Comments)    Lactose intolerance Can tolerate milk if its cooked into the recipe, just can't drink milk   . Red Dye Anaphylaxis  . Whey Other (See Comments)    Lactose intolerance    . Darvon [Propoxyphene] Rash  . Hydralazine Anxiety and Other (See Comments)    Facial flushing, pt prefers not to use it.     Family History  Problem Relation Age of Onset  . Stroke Mother   . Heart disease Mother   . Emphysema Father   . Ovarian cancer Sister   . Stroke Sister   . Other Child        died at birth    Prior to Admission medications   Medication Sig Start Date End Date Taking? Authorizing Provider  acetaminophen (TYLENOL) 500 MG tablet Take 500-1,000 mg by mouth daily as needed for mild pain or moderate pain.    Yes [provider]  albuterol (PROVENTIL HFA;VENTOLIN HFA) 108 (90 Base) MCG/ACT inhaler Inhale 2 puffs into the lungs every 6 (six) hours as needed for wheezing or shortness of breath. 09/05/17  Yes Nafziger, Tommi Rumps, NP  amLODipine (NORVASC) 10 MG tablet Take 1 tablet (10 mg total) by mouth daily. 05/04/17  Yes Nafziger, Tommi Rumps, NP  aspirin EC 81 MG tablet Take 1 tablet (81  mg total) by mouth daily. 11/02/16  Yes Hilty, Nadean Corwin, MD  carvedilol (COREG) 12.5 MG tablet TAKE (1) TABLET BY MOUTH TWICE DAILY. 12/27/16  Yes Hilty, Nadean Corwin, MD  cloNIDine (CATAPRES - DOSED IN MG/24 HR) 0.2 mg/24hr patch APPLY 1 PATCH ONTO THE SKIN ONCE WEEKLY. 09/17/17  Yes Hilty, Nadean Corwin, MD  cyanocobalamin (,VITAMIN B-12,) 1000 MCG/ML injection IM daily 11/29-12/3, then weekly x4, then monthly. Patient taking differently: Inject 1,000 mcg into the muscle every 30 (thirty) days.  05/30/16  Yes Samuella Cota, MD  ezetimibe-simvastatin (VYTORIN) 10-20 MG tablet Take 1 tablet by mouth daily. 09/10/17  Yes Hilty, Nadean Corwin, MD  ferrous sulfate 325 (65 FE) MG tablet Take 325 mg by mouth every evening.    Yes [provider]  furosemide (LASIX) 40 MG tablet TAKE 1 TABLET BY MOUTH ONCE DAILY. 11/13/17  Yes Nafziger, Tommi Rumps, NP  gabapentin (NEURONTIN) 300 MG capsule TAKE 1 CAPSULE BY MOUTH THREE TIMES A DAY. Patient taking differently: Take 300 mg by mouth daily as needed (for  pain). PRESCRIBED 1 CAPSULE BY MOUTH THREE TIMES A DAY. 09/05/17  Yes Nafziger, Tommi Rumps, NP  meclizine (ANTIVERT) 25 MG tablet Take 25 mg by mouth as needed for dizziness or nausea.  08/28/14  Yes [provider]  pantoprazole (PROTONIX) 40 MG tablet TAKE 1 TABLET BY MOUTH TWICE DAILY. 12/05/17  Yes Nafziger, Tommi Rumps, NP  Pediatric Multivitamins-Iron (FLINTSTONES PLUS IRON) chewable tablet Chew 2 tablets by mouth daily. 05/30/16  Yes Samuella Cota, MD  temazepam (RESTORIL) 15 MG capsule TAKE (1) CAPSULE BY MOUTH AT BEDTIME. 12/12/17  Yes Nafziger, Tommi Rumps, NP  warfarin (COUMADIN) 5 MG tablet TAKE 1/2 TABLET BY MOUTH ON WEDNESDAY AND 1 TABLET ALL OTHER DAYS OR AS DIRECTED BY ANTICOAGULATION CLINIC. Patient taking differently: Take 2.5-5 mg by mouth See admin instructions. 1/2 tablet on all days except on Sundays Tuesdays and Thursdays. Take 1 tablet on these days. 12/07/17  Yes Nafziger, Tommi Rumps, NP  doxycycline (VIBRAMYCIN) 100 MG capsule Take 1 capsule (100 mg total) by mouth 2 (two) times daily. Patient not taking: Reported on 12/19/2017 11/30/17   Miranda Peng, NP    Physical Exam: Vitals:   12/19/17 1630 12/19/17 1700 12/19/17 1745 12/19/17 1758  BP: 132/65 (!) 114/48  (!) 153/61  Pulse: 70 75 85   Resp: 17 15 19    Temp:      TempSrc:      SpO2: 95% 95% 96%   Weight:      Height:        Constitutional: NAD, calm, comfortable Eyes: PERRL, lids and conjunctivae normal ENMT: Mucous membranes are moist. Posterior pharynx clear of any exudate or lesions. Neck: normal, supple, no masses, no thyromegaly Respiratory: clear to auscultation bilaterally, no wheezing, no crackles. Normal respiratory effort. No accessory muscle use.  Cardiovascular: Irregularly irregular, 3/6 SEM, no rubs / gallops.  1+ lower extremities pitting edema. 2+ pedal pulses. No carotid bruits.  Abdomen: Nondistended.  Bowel sounds positive.  Soft, positive epigastric tenderness, no guarding/rebound/masses palpated. No  hepatosplenomegaly.  Musculoskeletal: no clubbing / cyanosis. Good ROM, no contractures. Normal muscle tone.  Skin: Occasional very small Small areas of ecchymosis on extremities, otherwise no clinically significant rashes, lesions, ulcers on limited dermatological exam. Neurologic: CN 2-12 grossly intact.  Subjective decrease in right facial sensation, DTR normal. Strength 5/5 in all 4.  Psychiatric: Normal judgment and insight. Alert and oriented x 4 . Normal mood.    Labs on Admission: I  have personally reviewed following labs and imaging studies  CBC: Recent Labs  Lab 12/19/17 1508  WBC 5.9  NEUTROABS 3.0  HGB 13.3  HCT 38.7  MCV 87.4  PLT 161*   Basic Metabolic Panel: Recent Labs  Lab 12/19/17 1508  NA 139  K 4.1  CL 102  CO2 30  GLUCOSE 121*  BUN 14  CREATININE 0.76  CALCIUM 8.8*   GFR: Estimated Creatinine Clearance: 45.8 mL/min (by C-G formula based on SCr of 0.76 mg/dL). Liver Function Tests: No results for input(s): AST, ALT, ALKPHOS, BILITOT, PROT, ALBUMIN in the last 168 hours. No results for input(s): LIPASE, AMYLASE in the last 168 hours. No results for input(s): AMMONIA in the last 168 hours. Coagulation Profile: Recent Labs  Lab 12/19/17 1508  INR 2.06   Cardiac Enzymes: Recent Labs  Lab 12/19/17 1508  TROPONINI <0.03   BNP (last 3 results) No results for input(s): PROBNP in the last 8760 hours. HbA1C: No results for input(s): HGBA1C in the last 72 hours. CBG: No results for input(s): GLUCAP in the last 168 hours. Lipid Profile: No results for input(s): CHOL, HDL, LDLCALC, TRIG, CHOLHDL, LDLDIRECT in the last 72 hours. Thyroid Function Tests: No results for input(s): TSH, T4TOTAL, FREET4, T3FREE, THYROIDAB in the last 72 hours. Anemia Panel: No results for input(s): VITAMINB12, FOLATE, FERRITIN, TIBC, IRON, RETICCTPCT in the last 72 hours. Urine analysis:    Component Value Date/Time   COLORURINE YELLOW 10/10/2016 1138   APPEARANCEUR  CLEAR 10/10/2016 1138   LABSPEC 1.017 10/10/2016 1138   PHURINE 8.0 10/10/2016 1138   GLUCOSEU 50 (A) 10/10/2016 1138   HGBUR SMALL (A) 10/10/2016 1138   BILIRUBINUR NEGATIVE 10/10/2016 1138   KETONESUR NEGATIVE 10/10/2016 1138   PROTEINUR 30 (A) 10/10/2016 1138   UROBILINOGEN 0.2 05/04/2015 2308   NITRITE NEGATIVE 10/10/2016 1138   LEUKOCYTESUR NEGATIVE 10/10/2016 1138    Radiological Exams on Admission: Ct Head Wo Contrast  Result Date: 12/19/2017 CLINICAL DATA:  Chest pain and shortness of breath starting this morning. On Coumadin. EXAM: CT HEAD WITHOUT CONTRAST TECHNIQUE: Contiguous axial images were obtained from the base of the skull through the vertex without intravenous contrast. COMPARISON:  Head CT dated 01/12/2017. FINDINGS: Brain: Ventricles are stable in size and configuration. Very mild chronic small vessel ischemic change noted within the bilateral periventricular white matter regions. Small old lacunar infarct is again noted within the RIGHT basal ganglia. Also again noted is an additional old infarct within the RIGHT cerebellum with associated encephalomalacia. There is no mass, hemorrhage, edema or other evidence of acute parenchymal abnormality. No extra-axial hemorrhage. Vascular: There are chronic calcified atherosclerotic changes of the large vessels at the skull base. No unexpected hyperdense vessel. Skull: Normal. Negative for fracture or focal lesion. Sinuses/Orbits: No acute finding. Other: None. IMPRESSION: 1. No acute findings.  No intracranial mass, hemorrhage or edema. 2. Chronic ischemic changes, as detailed above. Electronically Signed   By: Franki Cabot M.D.   On: 12/19/2017 16:48   Dg Chest Portable 1 View  Result Date: 12/19/2017 CLINICAL DATA:  Chest pain and shortness of breath EXAM: PORTABLE CHEST 1 VIEW COMPARISON:  11/02/2016 FINDINGS: Normal heart size. There is superimposed mediastinal density from a hiatal hernia. Stable aortic and hilar contours. Mild  interstitial coarsening in the setting of lower lung volumes. No Kerley lines, air bronchogram, effusion, or pneumothorax. IMPRESSION: 1. No acute finding. 2. Hiatal hernia. Electronically Signed   By: Monte Fantasia M.D.   On: 12/19/2017 15:19  11/08/2016 Left Heart Cath and Coronary Angiography   Conclusion     There is hyperdynamic left ventricular systolic function.  The left ventricular ejection fraction is greater than 65% by visual estimate.  LV end diastolic pressure is moderately elevated.  Moderate two-vessel disease: Ramus lesion, 60 %stenosed. RPDA lesion, 60 %stenosed.  There is mild aortic valve stenosis.   No obvious culprit lesion to explain the patient's angina. Is likely that her hypertension with existing disease as the etiology for chest pain. Would recommend aggressive management of hypertension.  The patient be discharged from the PACU holding area post sheath removal  We'll need to take 1 additional dose of oral prednisone post procedure   Glenetta Hew, M.D., M.S. Interventional Cardiologist      08/20/2017 echocardiogram complete ------------------------------------------------------------------- LV EF: 60% -   65%  ------------------------------------------------------------------- Indications:      I35.9 Aortic Valve Disorder.  ------------------------------------------------------------------- History:   PMH:  Acquired from the patient and from the patient&'s chart.  PMH:  Paroxysmal Atrial Fibrillation. Anemia. Coronary artery disease. Congestive heart failure. Stroke. Pulmonary emboli.  Risk factors:  Hypertension.  ------------------------------------------------------------------- Study Conclusions  - Left ventricle: The cavity size was normal. There was mild focal   basal and mild concentric hypertrophy of the septum. Systolic   function was normal. The estimated ejection fraction was in the   range of 60% to 65%. Wall  motion was normal; there were no   regional wall motion abnormalities. Features are consistent with   a pseudonormal left ventricular filling pattern, with concomitant   abnormal relaxation and increased filling pressure (grade 2   diastolic dysfunction). - Aortic valve: Valve mobility was restricted. There was moderate   stenosis. - Mitral valve: Severely calcified annulus. Mildly thickened   leaflets . The findings are consistent with trivial stenosis.   Valve area by pressure half-time: 2.19 cm^2. Valve area by   continuity equation (using LVOT flow): 1.98 cm^2. - Left atrium: The atrium was moderately dilated.  Impressions:  - Aortic valve gradients are lower than estimated in 2017. They are   likely underestmated due to poor beam alignment. The aortic jet   remains early peaking, consistent with non-severe aortic   stenosis.   EKG: Independently reviewed. Vent. rate 83 BPM PR interval * ms QRS duration 77 ms QT/QTc 378/428 ms P-R-T axes * 33 64 Atrial fibrillation Low voltage, precordial leads  Assessment/Plan Principal Problem:   Chest pain Still having active chest pain.   Although feels better after morphine. Observation/stepdown. Nitropaste to anterior chest wall. Continue morphine 2 mg IVP every hour as needed. Trend troponin level. Check VQ scan in a.m.  Active Problems:   History of pulmonary embolism Although the patient is on warfarin, sudden onset of chest pain, right lower extremity tenderness and elevated d-dimer is moderately suspicious for recurrent PE. Check lower extremity venous Doppler and VQ scan in the morning.    GERD (gastroesophageal reflux disease) Continue Protonix 40 mg p.o. twice daily. Trial of famotidine 20 mg IVP x1 dose.     Numbness of face. History of previous strokes. Frequent neurochecks. Check MR/MRA of brain in the morning. Check carotid Doppler and echo.    CAD (coronary artery disease) Continue aspirin,  beta-blocker and Vytorin.    PAF (paroxysmal atrial fibrillation) ([name]) CHA?DS?-VASc Score of 9. Continue carvedilol for rate control. Warfarin per pharmacy.    Chronic diastolic congestive heart failure ([name]) No signs of decompensation at this time. Continue carvedilol 12.5 mg p.o. twice  daily. Continue furosemide 40 mg p.o. Daily.    Aortic valve stenosis Moderate degree. Continue current treatment. Echocardiogram follow-up per cardiology.    Benign paroxysmal positional vertigo Continue meclizine 25 mg p.o. 3 times daily as needed.    Mixed hyperlipidemia Continue Vytorin 10-20 p.o. daily. Monitor LFTs as needed. Fasting lipid follow-up as an outpatient..    Hyperglycemia Carbohydrate modified diet. CBG monitoring before meals and bedtime.    Essential hypertension, benign Carvedilol and furosemide. Monitor blood pressure, heart rate, renal function and electrolytes.    DVT prophylaxis: On warfarin. Code Status: Full code. Family Communication:  Disposition Plan: Observation in stepdown for active chest pain. Consults called:  Admission status: Observation/stepdown.   Reubin Milan MD [facility] Pager [phone].  If 7PM-7AM, please contact night-coverage [email or web address] Password [facility]  12/19/2017, 6:16 PM

## 2017-12-19 NOTE — ED Notes (Signed)
Patient given one nitro SL, B/P dropped to 104/58. Patient still rates pain at a 6 with no relief from first nitro. Nitro held at this time due to B/P.

## 2017-12-20 ENCOUNTER — Observation Stay (HOSPITAL_COMMUNITY): Payer: Medicare Other

## 2017-12-20 ENCOUNTER — Observation Stay (HOSPITAL_BASED_OUTPATIENT_CLINIC_OR_DEPARTMENT_OTHER): Payer: Medicare Other

## 2017-12-20 DIAGNOSIS — I951 Orthostatic hypotension: Secondary | ICD-10-CM

## 2017-12-20 DIAGNOSIS — H8110 Benign paroxysmal vertigo, unspecified ear: Secondary | ICD-10-CM

## 2017-12-20 DIAGNOSIS — I361 Nonrheumatic tricuspid (valve) insufficiency: Secondary | ICD-10-CM

## 2017-12-20 DIAGNOSIS — R0789 Other chest pain: Secondary | ICD-10-CM | POA: Diagnosis not present

## 2017-12-20 DIAGNOSIS — I340 Nonrheumatic mitral (valve) insufficiency: Secondary | ICD-10-CM | POA: Diagnosis not present

## 2017-12-20 DIAGNOSIS — I6523 Occlusion and stenosis of bilateral carotid arteries: Secondary | ICD-10-CM | POA: Diagnosis not present

## 2017-12-20 DIAGNOSIS — I669 Occlusion and stenosis of unspecified cerebral artery: Secondary | ICD-10-CM | POA: Diagnosis not present

## 2017-12-20 DIAGNOSIS — R600 Localized edema: Secondary | ICD-10-CM | POA: Diagnosis not present

## 2017-12-20 LAB — CBC
HEMATOCRIT: 34.9 % — AB (ref 36.0–46.0)
Hemoglobin: 11.6 g/dL — ABNORMAL LOW (ref 12.0–15.0)
MCH: 29.4 pg (ref 26.0–34.0)
MCHC: 33.2 g/dL (ref 30.0–36.0)
MCV: 88.4 fL (ref 78.0–100.0)
PLATELETS: 125 10*3/uL — AB (ref 150–400)
RBC: 3.95 MIL/uL (ref 3.87–5.11)
RDW: 12.4 % (ref 11.5–15.5)
WBC: 4 10*3/uL (ref 4.0–10.5)

## 2017-12-20 LAB — COMPREHENSIVE METABOLIC PANEL
ALT: 64 U/L — ABNORMAL HIGH (ref 14–54)
AST: 76 U/L — AB (ref 15–41)
Albumin: 3.2 g/dL — ABNORMAL LOW (ref 3.5–5.0)
Alkaline Phosphatase: 101 U/L (ref 38–126)
Anion gap: 7 (ref 5–15)
BILIRUBIN TOTAL: 1 mg/dL (ref 0.3–1.2)
BUN: 13 mg/dL (ref 6–20)
CO2: 30 mmol/L (ref 22–32)
CREATININE: 0.81 mg/dL (ref 0.44–1.00)
Calcium: 8.2 mg/dL — ABNORMAL LOW (ref 8.9–10.3)
Chloride: 104 mmol/L (ref 101–111)
Glucose, Bld: 125 mg/dL — ABNORMAL HIGH (ref 65–99)
POTASSIUM: 4 mmol/L (ref 3.5–5.1)
Sodium: 141 mmol/L (ref 135–145)
TOTAL PROTEIN: 5.8 g/dL — AB (ref 6.5–8.1)

## 2017-12-20 LAB — PROTIME-INR
INR: 2.39
PROTHROMBIN TIME: 25.9 s — AB (ref 11.4–15.2)

## 2017-12-20 LAB — GLUCOSE, CAPILLARY
GLUCOSE-CAPILLARY: 114 mg/dL — AB (ref 65–99)
GLUCOSE-CAPILLARY: 203 mg/dL — AB (ref 65–99)
Glucose-Capillary: 128 mg/dL — ABNORMAL HIGH (ref 65–99)
Glucose-Capillary: 133 mg/dL — ABNORMAL HIGH (ref 65–99)

## 2017-12-20 LAB — MRSA PCR SCREENING: MRSA by PCR: NEGATIVE

## 2017-12-20 LAB — ECHOCARDIOGRAM COMPLETE
Height: 62 in
Weight: 1883.61 oz

## 2017-12-20 LAB — TROPONIN I: Troponin I: <0.03 ng/mL (ref <0.03)

## 2017-12-20 MED ORDER — EZETIMIBE 10 MG PO TABS: 10.0000 mg | ORAL_TABLET | Freq: Every day | ORAL | Status: DC

## 2017-12-20 MED ORDER — WARFARIN SODIUM 2.5 MG PO TABS
5.0000 mg | ORAL_TABLET | Freq: Once | ORAL | Status: AC
  Administered 2017-12-20: 5 mg via ORAL
  Filled 2017-12-20: qty 2

## 2017-12-20 MED ORDER — SIMVASTATIN 20 MG PO TABS: 20.0000 mg | ORAL_TABLET | Freq: Every day | ORAL | Status: DC

## 2017-12-20 NOTE — Progress Notes (Signed)
ANTICOAGULATION CONSULT NOTE - Initial Consult  Pharmacy Consult for Warfarin Indication: atrial fibrillation  Allergies  Allergen Reactions  . Iodinated Diagnostic Agents Anaphylaxis and Other (See Comments)    IPD dye Info given by patient  . Ioxaglate Anaphylaxis and Other (See Comments)    Info given by patient  . Milk-Related Compounds Anaphylaxis and Other (See Comments)    Lactose intolerance Can tolerate milk if its cooked into the recipe, just can't drink milk   . Red Dye Anaphylaxis  . Whey Other (See Comments)    Lactose intolerance  . Darvon [Propoxyphene] Rash  . Hydralazine Anxiety and Other (See Comments)    Facial flushing, pt prefers not to use it.    Patient Measurements: Height: 5\' 2"  (157.5 cm) Weight: 117 lb 11.6 oz (53.4 kg) IBW/kg (Calculated) : 50.1 Heparin Dosing Weight:   Vital Signs: Temp: 97.4 F (36.3 C) (06/20 0400) Temp Source: Oral (06/20 0400) BP: 90/28 (06/20 0600) Pulse Rate: 74 (06/20 0800)  Labs: Recent Labs    12/19/17 1508 12/19/17 1820 12/20/17 0101 12/20/17 0704  HGB 13.3  --   --  11.6*  HCT 38.7  --   --  34.9*  PLT 142*  --   --  125*  LABPROT 23.1*  --   --  25.9*  INR 2.06  --   --  2.39  CREATININE 0.76  --   --  0.81  TROPONINI <0.03 <0.03 <0.03 <0.03   Estimated Creatinine Clearance: 45.3 mL/min (by C-G formula based on SCr of 0.81 mg/dL).  Medical History: Past Medical History:  Diagnosis Date  . Anemia 2005   Generally microcytic, transfusions in [address], 2012, 02/2015, 05/2015  . Aortic stenosis    Moderate November 2017  . Arthritis   . Broken back 2013   Chronic back pain.   [name] CAD (coronary artery disease)    Cardiac catheterization 2014 - 80% mid RCA and 70% OM managed medically  . CHF (congestive heart failure) ([place])   . Contrast media allergy   . Diastolic heart failure ([place])   . Esophageal cancer ([place]) 05/06/2015   Adenocarcinoma GE junction  . GERD (gastroesophageal reflux disease)   . H/O  iron deficiency    05-06-15 iron infusion ([facility])  . HH (hiatus hernia) 2008   Large with associated erosions  . Hypertension   . Paroxysmal atrial fibrillation ([name])   . PONV (postoperative nausea and vomiting)   . Pulmonary emboli ([place]) 2008, 2012  . Stroke [facility]) 1982   Medications:  Medications Prior to Admission  Medication Sig Dispense Refill Last Dose  . acetaminophen (TYLENOL) 500 MG tablet Take 500-1,000 mg by mouth daily as needed for mild pain or moderate pain.    Past Week at Unknown time  . albuterol (PROVENTIL HFA;VENTOLIN HFA) 108 (90 Base) MCG/ACT inhaler Inhale 2 puffs into the lungs every 6 (six) hours as needed for wheezing or shortness of breath. 1 Inhaler 2 unknown at Unknown time  . amLODipine (NORVASC) 10 MG tablet Take 1 tablet (10 mg total) by mouth daily. 90 tablet 0 12/19/2017 at Unknown time  . aspirin EC 81 MG tablet Take 1 tablet (81 mg total) by mouth daily. 90 tablet 3 12/19/2017 at Unknown time  . carvedilol (COREG) 12.5 MG tablet TAKE (1) TABLET BY MOUTH TWICE DAILY. 180 tablet 3 12/19/2017 at [place]  . cloNIDine (CATAPRES - DOSED IN MG/24 HR) 0.2 mg/24hr patch APPLY 1 PATCH ONTO THE SKIN ONCE WEEKLY. 4 patch 6  Past Week at Unknown time  . cyanocobalamin (,VITAMIN B-12,) 1000 MCG/ML injection IM daily 11/29-12/3, then weekly x4, then monthly. (Patient taking differently: Inject 1,000 mcg into the muscle every 30 (thirty) days. ) 9 mL 0 Past Month at Unknown time  . ezetimibe-simvastatin (VYTORIN) 10-20 MG tablet Take 1 tablet by mouth daily. 30 tablet 6 12/18/2017 at Unknown time  . ferrous sulfate 325 (65 FE) MG tablet Take 325 mg by mouth every evening.    12/18/2017 at Unknown time  . furosemide (LASIX) 40 MG tablet TAKE 1 TABLET BY MOUTH ONCE DAILY. 90 tablet 1 12/19/2017 at Unknown time  . gabapentin (NEURONTIN) 300 MG capsule TAKE 1 CAPSULE BY MOUTH THREE TIMES A DAY. (Patient taking differently: Take 300 mg by mouth daily as needed (for pain). PRESCRIBED 1 CAPSULE  BY MOUTH THREE TIMES A DAY.) 270 capsule 1 unknown at Unknown time  . meclizine (ANTIVERT) 25 MG tablet Take 25 mg by mouth as needed for dizziness or nausea.    unknown  . pantoprazole (PROTONIX) 40 MG tablet TAKE 1 TABLET BY MOUTH TWICE DAILY. 180 tablet 2 12/19/2017 at Unknown time  . Pediatric Multivitamins-Iron (FLINTSTONES PLUS IRON) chewable tablet Chew 2 tablets by mouth daily. 60 tablet 0 unknown  . temazepam (RESTORIL) 15 MG capsule TAKE (1) CAPSULE BY MOUTH AT BEDTIME. 30 capsule 0 12/18/2017 at Unknown time  . warfarin (COUMADIN) 5 MG tablet TAKE 1/2 TABLET BY MOUTH ON WEDNESDAY AND 1 TABLET ALL OTHER DAYS OR AS DIRECTED BY ANTICOAGULATION CLINIC. (Patient taking differently: Take 2.5-5 mg by mouth See admin instructions. 1/2 tablet on all days except on Sundays Tuesdays and Thursdays. Take 1 tablet on these days.) 90 tablet 1 12/18/2017 at 1800  . doxycycline (VIBRAMYCIN) 100 MG capsule Take 1 capsule (100 mg total) by mouth 2 (two) times daily. (Patient not taking: Reported on 12/19/2017) 14 capsule 0 Not Taking at Unknown time   Home Warfarin 2.5mg  M-W-F-Sat                               5mg  Tue-Thur-Sun  Assessment: Okay for Protocol, INR at goal. No bleeding noted.  Last Warfarin dose 6/18 per home medication list. INR remains at goal.  Goal of Therapy:  INR 2-3 Monitor platelets by anticoagulation protocol: Yes   Plan:  Warfarin 5mg  PO x 1 Daily PT / INR Monitor for signs and symptoms of bleeding.   Isac Sarna, BS [place], BCPS Clinical Pharmacist Pager [phone] 12/20/2017,9:13 AM

## 2017-12-20 NOTE — Plan of Care (Signed)
  Problem: Acute Rehab PT Goals(only PT should resolve) Goal: Pt Will Go Supine/Side To Sit Outcome: Progressing Flowsheets (Taken 12/20/2017 1623) Pt will go Supine/Side to Sit: with modified independence Goal: Patient Will Transfer Sit To/From Stand Outcome: Progressing Flowsheets (Taken 12/20/2017 1623) Patient will transfer sit to/from stand: with modified independence Goal: Pt Will Transfer Bed To Chair/Chair To Bed Outcome: Progressing Flowsheets (Taken 12/20/2017 1623) Pt will Transfer Bed to Chair/Chair to Bed: with modified independence Goal: Pt Will Ambulate Outcome: Progressing Flowsheets (Taken 12/20/2017 1623) Pt will Ambulate: > 125 feet;with modified independence;with rolling walker   4:23 PM, 12/20/17 Lonell Grandchild, MPT Physical Therapist with [facility] [phone] office [phone] mobile phone

## 2017-12-20 NOTE — Progress Notes (Signed)
*  PRELIMINARY RESULTS* Echocardiogram 2D Echocardiogram has been performed.  [name] 12/20/2017, 4:26 PM

## 2017-12-20 NOTE — Progress Notes (Signed)
PROGRESS NOTE                                                                                                                                                                                                             Patient Demographics:    [name], is a 77 y.o. female, DOB - July 06, 1940, LKT:[id number]  Admit date - 12/19/2017   Admitting Physician Reubin Milan, MD  Outpatient Primary MD for the patient is Dorothyann Peng, NP  LOS - 0  Outpatient Specialists: None  Chief Complaint  Patient presents with  . Chest Pain  . Shortness of Breath       Brief Narrative 77 year old female with aortic stenosis, chronic back pain, CAD, chronic diastolic CHF, history of esophageal cancer, GERD, hiatal hernia, history of CVA, paroxysmal A. fib and pulmonary embolism x2 on Coumadin presented with chest tightness and shortness of breath since the afternoon of admission.  She reports pain to be sudden onset precordial nonradiating and worsened with movement.  She also reports some epigastric discomfort.  R also reported having right facial numbness on waking up on the morning of admission.  The numbness persisted during the day and had not resolved even when she came to the ED.  Reported some lightheadedness on trying to ambulate.  Recent change in her medications.  Denies any chest trauma or fall. In the ED vitals were stable except for elevated blood pressure of 180/72 mmHg. Patient given full dose aspirin, sublingual nitrate and 1 mg IV morphine in the ED.  Blood work showed normal CBC, therapeutic INR, mildly elevated d-dimer of 0.70.  EKG showed A. fib at 83 bpm without any ST-T changes.  Initial troponin was negative.  Chemistry was unremarkable.  Chest x-ray negative except for chronic hiatal hernia.  Head CT without acute findings and showed old ischemic changes. Patient placed on observation in stepdown unit for chest pain  rule out and stroke work-up.    Subjective:   Patient denies active chest pain.  Still feels lightheaded on trying to get up.   Assessment  & Plan :    Principal Problem:   Chest pain, atypical I think this is musculoskeletal and is reproducible in exam.  Serial troponin is negative.  Continue aspirin.  Follow 2D echo  Active Problems: Right facial numbness  MRI brain negative  for acute findings.  Carotid ultrasound also negative for hemodynamically significant stenosis. 2D echo results pending.  TIA is a possibility but given her history and prolonged symptoms is unlikely. Patient does have positional vertigo symptoms and meclizine is continued.   Orthostatic hypotension Hold her medications and monitor with IV fluids.  Seen by PT and recommends home health.  History of pulmonary embolism and paroxysmal A. fib Continue Coumadin.  INR therapeutic.  VQ scan was done which showed moderate probability for PE.  Right lower extremity Doppler was negative for DVT.  I highly doubt patient has pulmonary embolism and patient being on chronic Coumadin with therapeutic INR is reassuring.  Mixed hyperlipidemia Hold statin due to mild transaminitis.  Iron deficiency anemia Continue supplement.  Peripheral neuropathy Continue Neurontin     Code Status : Full code  Family Communication  : Friend at bedside  Disposition Plan  : Home with home health tomorrow if orthostasis resolves  Barriers For Discharge : Active symptoms  Consults  : None  Procedures  : CT head, MRI brain, carotid ultrasound, Doppler right lower extremity, VQ scan, 2D echo  DVT Prophylaxis  : Coumadin  Lab Results  Component Value Date   PLT 125 (L) 12/20/2017    Antibiotics  :   Anti-infectives (From admission, onward)   None        Objective:   Vitals:   12/20/17 1022 12/20/17 1100 12/20/17 1200 12/20/17 1300  BP:      Pulse: 62 65 69 73  Resp: 16 17 16 17   Temp:   97.9 F (36.6 C)     TempSrc:   Oral   SpO2: 99% 99% 98% 97%  Weight:      Height:        Wt Readings from Last 3 Encounters:  12/20/17 53.4 kg (117 lb 11.6 oz)  11/30/17 53.5 kg (118 lb)  11/05/17 54.1 kg (119 lb 3.2 oz)     Intake/Output Summary (Last 24 hours) at 12/20/2017 1457 Last data filed at 12/20/2017 0500 Gross per 24 hour  Intake 765 ml  Output 250 ml  Net 515 ml     Physical Exam  Gen: not in distress HEENT: No pallor, moist mucosa, supple neck Chest: clear b/l, no added sounds CVS: S1 and S2 irregular, no murmurs GI: soft, NT, ND, BS+ Musculoskeletal: warm, no edema CNS: Alert and oriented, nonfocal    Data Review:    CBC Recent Labs  Lab 12/19/17 1508 12/20/17 0704  WBC 5.9 4.0  HGB 13.3 11.6*  HCT 38.7 34.9*  PLT 142* 125*  MCV 87.4 88.4  MCH 30.0 29.4  MCHC 34.4 33.2  RDW 12.4 12.4  LYMPHSABS 2.2  --   MONOABS 0.5  --   EOSABS 0.2  --   BASOSABS 0.0  --     Chemistries  Recent Labs  Lab 12/19/17 1508 12/19/17 1820 12/20/17 0704  NA 139  --  141  K 4.1  --  4.0  CL 102  --  104  CO2 30  --  30  GLUCOSE 121*  --  125*  BUN 14  --  13  CREATININE 0.76  --  0.81  CALCIUM 8.8*  --  8.2*  MG  --  2.0  --   AST  --   --  76*  ALT  --   --  64*  ALKPHOS  --   --  101  BILITOT  --   --  1.0   ------------------------------------------------------------------------------------------------------------------  No results for input(s): CHOL, HDL, LDLCALC, TRIG, CHOLHDL, LDLDIRECT in the last 72 hours.  Lab Results  Component Value Date   HGBA1C 6.4 09/06/2017   ------------------------------------------------------------------------------------------------------------------ No results for input(s): TSH, T4TOTAL, T3FREE, THYROIDAB in the last 72 hours.  Invalid input(s): FREET3 ------------------------------------------------------------------------------------------------------------------ No results for input(s): VITAMINB12, FOLATE, FERRITIN, TIBC,  IRON, RETICCTPCT in the last 72 hours.  Coagulation profile Recent Labs  Lab 12/19/17 1508 12/20/17 0704  INR 2.06 2.39    Recent Labs    12/19/17 1508  DDIMER 0.70*    Cardiac Enzymes Recent Labs  Lab 12/19/17 1820 12/20/17 0101 12/20/17 0704  TROPONINI <0.03 <0.03 <0.03   ------------------------------------------------------------------------------------------------------------------    Component Value Date/Time   BNP 687.0 (H) 10/10/2016 1125    Inpatient Medications  Scheduled Meds: . aspirin EC  81 mg Oral Daily  . ezetimibe  10 mg Oral Daily   And  . simvastatin  20 mg Oral q1800  . ferrous sulfate  325 mg Oral QPM  . gabapentin  300 mg Oral QHS  . pantoprazole  40 mg Oral BID  . warfarin  5 mg Oral Once  . Warfarin - Pharmacist Dosing Inpatient   Does not apply Q24H   Continuous Infusions: . sodium chloride Stopped (12/19/17 1935)  . famotidine (PEPCID) IV     PRN Meds:.acetaminophen **OR** acetaminophen, albuterol, ALPRAZolam, meclizine, morphine injection, ondansetron **OR** ondansetron (ZOFRAN) IV, temazepam  Micro Results Recent Results (from the past 240 hour(s))  MRSA PCR Screening     Status: None   Collection Time: 12/19/17  8:33 PM  Result Value Ref Range Status   MRSA by PCR NEGATIVE NEGATIVE Final    Comment:        The GeneXpert MRSA Assay (FDA approved for NASAL specimens only), is one component of a comprehensive MRSA colonization surveillance program. It is not intended to diagnose MRSA infection nor to guide or monitor treatment for MRSA infections. Performed at [facility], [address], [place], [place] [address]     Radiology Reports Ct Head Wo Contrast  Result Date: 12/19/2017 CLINICAL DATA:  Chest pain and shortness of breath starting this morning. On Coumadin. EXAM: CT HEAD WITHOUT CONTRAST TECHNIQUE: Contiguous axial images were obtained from the base of the skull through the vertex without intravenous contrast.  COMPARISON:  Head CT dated 01/12/2017. FINDINGS: Brain: Ventricles are stable in size and configuration. Very mild chronic small vessel ischemic change noted within the bilateral periventricular white matter regions. Small old lacunar infarct is again noted within the RIGHT basal ganglia. Also again noted is an additional old infarct within the RIGHT cerebellum with associated encephalomalacia. There is no mass, hemorrhage, edema or other evidence of acute parenchymal abnormality. No extra-axial hemorrhage. Vascular: There are chronic calcified atherosclerotic changes of the large vessels at the skull base. No unexpected hyperdense vessel. Skull: Normal. Negative for fracture or focal lesion. Sinuses/Orbits: No acute finding. Other: None. IMPRESSION: 1. No acute findings.  No intracranial mass, hemorrhage or edema. 2. Chronic ischemic changes, as detailed above. Electronically Signed   By: Franki Cabot M.D.   On: 12/19/2017 16:48   Mr Jodene Nam Head Wo Contrast  Result Date: 12/20/2017 CLINICAL DATA:  Altered mental status for 2 days. Focal neuro deficit for greater than 6 hours. Stroke suspected. The patient presented to the emergency department yesterday for chest pain and shortness of breath. She also complains of right-sided facial numbness beginning when she woke up yesterday. EXAM: MRI HEAD WITHOUT CONTRAST MRA HEAD  WITHOUT CONTRAST TECHNIQUE: Multiplanar, multiecho pulse sequences of the brain and surrounding structures were obtained without intravenous contrast. Angiographic images of the head were obtained using MRA technique without contrast. COMPARISON:  CT head 12/19/2017.  MRI brain 08/24/2014. FINDINGS: MRI HEAD FINDINGS Brain: The diffusion-weighted images demonstrate no acute or subacute infarct. Remote infarct of the right cerebellum is noted. Remote lacunar infarcts of the right basal ganglia are stable. Mild atrophy and white matter changes are present otherwise. No acute hemorrhage or mass lesion  is present. The internal auditory canals are within normal limits. The brainstem and cerebellum are otherwise within normal limits. Vascular: Flow is present in the major intracranial arteries. Skull and upper cervical spine: The skull base is within normal limits. The upper cervical spine is unremarkable. Sinuses/Orbits: The paranasal sinuses are clear. Mastoid air cells are clear. Bilateral lens replacements are again noted. Globes and orbits are otherwise within normal limits. MRA HEAD FINDINGS The internal carotid arteries are within normal limits from the high cervical segments through the ICA termini bilaterally. Mild narrowing of the mid left M1 segment. A1 and M1 segments are otherwise within normal limits. The anterior communicating artery is patent. There is a moderate stenosis of the distal left A2 segment. MCA bifurcations are intact. Irregularity is present throughout ACA and MCA branch vessels without other proximal stenosis or occlusion. The left vertebral artery is slightly dominant to the right. PICA origins are visualized and normal. Basilar artery is normal. Both posterior cerebral arteries originate from the basilar tip. There is mild narrowing of the proximal P2 vessels bilaterally. PCA branch vessels are otherwise intact. IMPRESSION: 1. No acute intracranial abnormality. 2. Stable remote infarcts involving the right basal ganglia and right cerebellum. 3. Mild atrophy and white matter changes likely reflect the sequela of chronic microvascular ischemia, these are stable. 4. Mild narrowing of the left M1 segment without a significant stenosis relative to the more distal vessel. 5. Moderate distal left A2 segment stenosis. 6. Mild narrowing of the proximal P2 segments bilaterally. 7. No other significant proximal stenosis, aneurysm, or branch vessel occlusion within the circle-of-Willis. Electronically Signed   By: San Morelle M.D.   On: 12/20/2017 09:04   Mr Brain Wo Contrast  Result  Date: 12/20/2017 CLINICAL DATA:  Altered mental status for 2 days. Focal neuro deficit for greater than 6 hours. Stroke suspected. The patient presented to the emergency department yesterday for chest pain and shortness of breath. She also complains of right-sided facial numbness beginning when she woke up yesterday. EXAM: MRI HEAD WITHOUT CONTRAST MRA HEAD WITHOUT CONTRAST TECHNIQUE: Multiplanar, multiecho pulse sequences of the brain and surrounding structures were obtained without intravenous contrast. Angiographic images of the head were obtained using MRA technique without contrast. COMPARISON:  CT head 12/19/2017.  MRI brain 08/24/2014. FINDINGS: MRI HEAD FINDINGS Brain: The diffusion-weighted images demonstrate no acute or subacute infarct. Remote infarct of the right cerebellum is noted. Remote lacunar infarcts of the right basal ganglia are stable. Mild atrophy and white matter changes are present otherwise. No acute hemorrhage or mass lesion is present. The internal auditory canals are within normal limits. The brainstem and cerebellum are otherwise within normal limits. Vascular: Flow is present in the major intracranial arteries. Skull and upper cervical spine: The skull base is within normal limits. The upper cervical spine is unremarkable. Sinuses/Orbits: The paranasal sinuses are clear. Mastoid air cells are clear. Bilateral lens replacements are again noted. Globes and orbits are otherwise within normal limits. MRA HEAD FINDINGS  The internal carotid arteries are within normal limits from the high cervical segments through the ICA termini bilaterally. Mild narrowing of the mid left M1 segment. A1 and M1 segments are otherwise within normal limits. The anterior communicating artery is patent. There is a moderate stenosis of the distal left A2 segment. MCA bifurcations are intact. Irregularity is present throughout ACA and MCA branch vessels without other proximal stenosis or occlusion. The left  vertebral artery is slightly dominant to the right. PICA origins are visualized and normal. Basilar artery is normal. Both posterior cerebral arteries originate from the basilar tip. There is mild narrowing of the proximal P2 vessels bilaterally. PCA branch vessels are otherwise intact. IMPRESSION: 1. No acute intracranial abnormality. 2. Stable remote infarcts involving the right basal ganglia and right cerebellum. 3. Mild atrophy and white matter changes likely reflect the sequela of chronic microvascular ischemia, these are stable. 4. Mild narrowing of the left M1 segment without a significant stenosis relative to the more distal vessel. 5. Moderate distal left A2 segment stenosis. 6. Mild narrowing of the proximal P2 segments bilaterally. 7. No other significant proximal stenosis, aneurysm, or branch vessel occlusion within the circle-of-Willis. Electronically Signed   By: San Morelle M.D.   On: 12/20/2017 09:04   US Carotid Bilateral  Result Date: 12/20/2017 CLINICAL DATA:  Right-sided facial numbness for the past day. History of CAD and hypertension. EXAM: BILATERAL CAROTID DUPLEX ULTRASOUND TECHNIQUE: Pearline Cables scale imaging, color Doppler and duplex ultrasound were performed of bilateral carotid and vertebral arteries in the neck. COMPARISON:  None. FINDINGS: Criteria: Quantification of carotid stenosis is based on velocity parameters that correlate the residual internal carotid diameter with NASCET-based stenosis levels, using the diameter of the distal internal carotid lumen as the denominator for stenosis measurement. The following velocity measurements were obtained: RIGHT ICA:  108/22 cm/sec CCA:  92/11 cm/sec SYSTOLIC ICA/CCA RATIO:  1.4 ECA:  91 cm/sec LEFT ICA:  106/22 cm/sec CCA:  94/17 cm/sec SYSTOLIC ICA/CCA RATIO:  1.2 ECA:  131 cm/sec RIGHT CAROTID ARTERY: There is a minimal to moderate amount of eccentric mixed echogenic plaque within the right carotid bulb (images 14 and 16), extending  to involve the origin and proximal aspects of the right internal carotid artery (image 24), not resulting in elevated peak systolic velocities within the interrogated course the right internal carotid artery to suggest a hemodynamically significant stenosis. RIGHT VERTEBRAL ARTERY:  Antegrade Flow LEFT CAROTID ARTERY: There is a moderate to large amount of eccentric mixed echogenic plaque within the left carotid bulb (images 48 and 50), extending to involve the origin and proximal aspects of the left internal carotid artery (image 59), not resulting in elevated peak systolic velocities within the interrogated course of the left internal carotid artery to suggest a hemodynamically significant stenosis. LEFT VERTEBRAL ARTERY:  Antegrade flow IMPRESSION: Moderate to large amount of bilateral atherosclerotic plaque, left greater than right, not resulting in a hemodynamically significant stenosis within either internal carotid artery. Electronically Signed   By: Sandi Mariscal M.D.   On: 12/20/2017 10:17   Nm Pulmonary Perf And Vent  Result Date: 12/19/2017 CLINICAL DATA:  Chest pain and shortness of breath. Esophageal carcinoma. EXAM: NUCLEAR MEDICINE VENTILATION - PERFUSION LUNG SCAN TECHNIQUE: Ventilation images were obtained in multiple projections using inhaled aerosol Tc-75m DTPA. Perfusion images were obtained in multiple projections after intravenous injection of Tc-44m-MAA. RADIOPHARMACEUTICALS:  Thirty-three mCi of Tc-77m DTPA aerosol inhalation and 4.5 mCi Tc70m-MAA IV COMPARISON:  Chest radiograph on 12/19/2017 FINDINGS: Ventilation:  Heterogeneous distribution of radiopharmaceutical activity throughout both lungs. Numerous small perfusion defects are seen in the mid and lower lung zones bilaterally. Perfusion: Multiple small and moderate perfusion defects are seen in both mid and lower lung zones, which match the areas of ventilation abnormality. No definite large unmatched pulmonary perfusion defects are  identified. IMPRESSION: Intermediate probability for pulmonary embolism. Electronically Signed   By: Earle Gell M.D.   On: 12/19/2017 20:46   US Venous Img Lower Right (dvt Study)  Result Date: 12/20/2017 CLINICAL DATA:  RIGHT lower extremity pain and edema for 1 day, elevated D-dimer, on Coumadin EXAM: RIGHT LOWER EXTREMITY VENOUS DOPPLER ULTRASOUND TECHNIQUE: Gray-scale sonography with graded compression, as well as color Doppler and duplex ultrasound were performed to evaluate the lower extremity deep venous systems from the level of the common femoral vein and including the common femoral, femoral, profunda femoral, popliteal and calf veins including the posterior tibial, peroneal and gastrocnemius veins when visible. The superficial great saphenous vein was also interrogated. Spectral Doppler was utilized to evaluate flow at rest and with distal augmentation maneuvers in the common femoral, femoral and popliteal veins. COMPARISON:  None FINDINGS: Contralateral Common Femoral Vein: Respiratory phasicity is normal and symmetric with the symptomatic side. No evidence of thrombus. Normal compressibility. Common Femoral Vein: No evidence of thrombus. Normal compressibility, respiratory phasicity and response to augmentation. Saphenofemoral Junction: No evidence of thrombus. Normal compressibility and flow on color Doppler imaging. Profunda Femoral Vein: No evidence of thrombus. Normal compressibility and flow on color Doppler imaging. Femoral Vein: No evidence of thrombus. Normal compressibility, respiratory phasicity and response to augmentation. Popliteal Vein: No evidence of thrombus. Normal compressibility, respiratory phasicity and response to augmentation. Calf Veins: No evidence of thrombus. Normal compressibility and flow on color Doppler imaging. Superficial Great Saphenous Vein: No evidence of thrombus. Normal compressibility. Venous Reflux:  None. Other Findings:  None. IMPRESSION: No evidence of  deep venous thrombosis in the RIGHT lower extremity. Electronically Signed   By: Lavonia Dana M.D.   On: 12/20/2017 09:45   Dg Chest Portable 1 View  Result Date: 12/19/2017 CLINICAL DATA:  Chest pain and shortness of breath EXAM: PORTABLE CHEST 1 VIEW COMPARISON:  11/02/2016 FINDINGS: Normal heart size. There is superimposed mediastinal density from a hiatal hernia. Stable aortic and hilar contours. Mild interstitial coarsening in the setting of lower lung volumes. No Kerley lines, air bronchogram, effusion, or pneumothorax. IMPRESSION: 1. No acute finding. 2. Hiatal hernia. Electronically Signed   By: Monte Fantasia M.D.   On: 12/19/2017 15:19    Time Spent in minutes  25   Ruthella Kirchman M.D on 12/20/2017 at 2:57 PM  Between 7am to 7pm - Pager - [phone]  After 7pm go to [email or web address] - password [facility]  [facility] -  Office  [phone]

## 2017-12-20 NOTE — Care Management Obs Status (Signed)
[facility] NOTIFICATION   Patient Details  Name: [name] MRN: [id number] Date of Birth: Mar 27, 1941   Medicare Observation Status Notification Given:  Yes    Sherald Barge, RN 12/20/2017, 4:19 PM

## 2017-12-20 NOTE — Evaluation (Addendum)
Physical Therapy Evaluation Patient Details Name: [name] MRN: [id number] DOB: 01/23/1941 Today's Date: 12/20/2017   History of Present Illness  [name] is a 77 y.o. female with medical history significant of previous anemia, aortic stenosis, osteoarthritis, chronic back pain, coronary artery disease, chronic diastolic CHF, history of esophageal cancer, GERD, hiatal hernia with a ructions, history of CVA, history of paroxysmal atrial fibrillation, history of pulmonary emboli x2 who is coming to the emergency department due to chest pain as of breath since about 1300.  She describes the pain as a sudden onset, precordial pressure, nonradiating, with occasional sharp exacerbations..  She also has some epigastric discomfort, but this denies feeling dyspepsia after lunch, which she had already had when the pain started.  The pain worsens with deep inspiration.  Associated symptoms are mild dyspnea and mild nausea.  No palpitations, diaphoresis, PND or orthopnea.  She gets frequent lower extremity pitting edema.  complains of epigastric abdominal pain, which she did notice while I was examining her.  No vomiting, diarrhea, constipation, melena or hematochezia.  No dysuria, frequency or hematuria.  Denies polyuria, polydipsia, polyphagia, or blurred vision.    Clinical Impression  Patient functioning near baseline for functional mobility and gait except mostly limited due to c/o nausea during gait training requiring frequent standing rest breaks.  Patient on room air throughout treatment with O2 saturation between 88-91 when walking and put back on 2 LPM when returned to bedside.  Patient tolerated sitting up in chair after therapy.  Patient will benefit from continued physical therapy in hospital and recommended venue below to increase strength, balance, endurance for safe ADLs and gait.    Follow Up Recommendations Home health PT;Supervision - Intermittent    Equipment Recommendations   None recommended by PT    Recommendations for Other Services       Precautions / Restrictions Precautions Precautions: Fall Restrictions Weight Bearing Restrictions: No      Mobility  Bed Mobility Overal bed mobility: Needs Assistance Bed Mobility: Supine to Sit     Supine to sit: Supervision     General bed mobility comments: slow labored  movement  Transfers Overall transfer level: Needs assistance Equipment used: Rolling walker (2 wheeled) Transfers: Sit to/from [employer] Transfers Sit to Stand: Supervision Stand pivot transfers: Supervision       General transfer comment: labored movement  Ambulation/Gait Ambulation/Gait assistance: Supervision Gait Distance (Feet): 80 Feet Assistive device: Rolling walker (2 wheeled) Gait Pattern/deviations: Decreased step length - right;Decreased step length - left;Decreased stride length Gait velocity: slow   General Gait Details: demonstrates slightly labored slow cadence without loss of balance, frequent standing rest breaks due to c/o nausea  Stairs            Wheelchair Mobility    Modified Rankin (Stroke Patients Only)       Balance Overall balance assessment: Needs assistance Sitting-balance support: Feet supported;No upper extremity supported Sitting balance-Leahy Scale: Good     Standing balance support: No upper extremity supported;During functional activity Standing balance-Leahy Scale: Poor Standing balance comment: fair using RW                             Pertinent Vitals/Pain Pain Assessment: 0-10 Pain Score: 5  Pain Location: chesp pain Pain Descriptors / Indicators: Aching Pain Intervention(s): Limited activity within patient's tolerance;Monitored during session    Home Living Family/patient expects to be discharged to:: Private residence Living Arrangements:  Children(son works during the day) Available Help at Discharge: Family Type of Home: House Home Access:  Stairs to enter Entrance Stairs-Rails: None Entrance Stairs-Number of Steps: 6 Home Layout: One level Home Equipment: Environmental consultant - 2 wheels;Shower seat;Bedside commode;Other (comment) Additional Comments: uses walking stick    Prior Function Level of Independence: Independent with assistive device(s)         Comments: household gait usually without AD, sometimes uses RW and walking stick     Hand Dominance        Extremity/Trunk Assessment   Upper Extremity Assessment Upper Extremity Assessment: Generalized weakness    Lower Extremity Assessment Lower Extremity Assessment: Generalized weakness    Cervical / Trunk Assessment Cervical / Trunk Assessment: Normal  Communication   Communication: No difficulties  Cognition Arousal/Alertness: Awake/alert Behavior During Therapy: WFL for tasks assessed/performed Overall Cognitive Status: Within Functional Limits for tasks assessed                                        General Comments      Exercises     Assessment/Plan    PT Assessment Patient needs continued PT services  PT Problem List Decreased strength;Decreased activity tolerance;Decreased balance;Decreased mobility       PT Treatment Interventions Gait training;Stair training;Functional mobility training;Therapeutic activities;Therapeutic exercise;Patient/family education    PT Goals (Current goals can be found in the Care Plan section)  Acute Rehab PT Goals Patient Stated Goal: return home feeling normal PT Goal Formulation: With patient Time For Goal Achievement: 12/22/17 Potential to Achieve Goals: Good    Frequency Min 3X/week   Barriers to discharge        Co-evaluation               AM-PAC PT "6 Clicks" Daily Activity  Outcome Measure Difficulty turning over in bed (including adjusting bedclothes, sheets and blankets)?: None Difficulty moving from lying on back to sitting on the side of the bed? : None Difficulty sitting  down on and standing up from a chair with arms (e.g., wheelchair, bedside commode, etc,.)?: None Help needed moving to and from a bed to chair (including a wheelchair)?: A Little Help needed walking in hospital room?: A Little Help needed climbing 3-5 steps with a railing? : A Little 6 Click Score: 21    End of Session   Activity Tolerance: Patient tolerated treatment well;Patient limited by fatigue(patient limited by nausea) Patient left: in chair;with call bell/phone within reach Nurse Communication: Mobility status;Other (comment)(RN notified that patient left up in chair) PT Visit Diagnosis: Unsteadiness on feet (R26.81);Other abnormalities of gait and mobility (R26.89);Muscle weakness (generalized) (M62.81)    Time: 1342-1410 PT Time Calculation (min) (ACUTE ONLY): 28 min   Charges:   PT Evaluation $PT Eval Moderate Complexity: 1 Mod PT Treatments $Therapeutic Activity: 23-37 mins   PT G Codes:        4:19 PM, Dec 21, 2017 Lonell Grandchild, MPT Physical Therapist with [facility] [phone] office [phone] mobile phone

## 2017-12-21 DIAGNOSIS — I350 Nonrheumatic aortic (valve) stenosis: Secondary | ICD-10-CM

## 2017-12-21 DIAGNOSIS — R200 Anesthesia of skin: Secondary | ICD-10-CM

## 2017-12-21 DIAGNOSIS — I951 Orthostatic hypotension: Secondary | ICD-10-CM | POA: Diagnosis not present

## 2017-12-21 DIAGNOSIS — R0789 Other chest pain: Secondary | ICD-10-CM | POA: Diagnosis not present

## 2017-12-21 DIAGNOSIS — G629 Polyneuropathy, unspecified: Secondary | ICD-10-CM

## 2017-12-21 LAB — HEPATIC FUNCTION PANEL
ALBUMIN: 3 g/dL — AB (ref 3.5–5.0)
ALT: 47 U/L (ref 14–54)
AST: 33 U/L (ref 15–41)
Alkaline Phosphatase: 95 U/L (ref 38–126)
BILIRUBIN DIRECT: 0.2 mg/dL (ref 0.1–0.5)
Indirect Bilirubin: 0.4 mg/dL (ref 0.3–0.9)
Total Bilirubin: 0.6 mg/dL (ref 0.3–1.2)
Total Protein: 5.6 g/dL — ABNORMAL LOW (ref 6.5–8.1)

## 2017-12-21 LAB — GLUCOSE, CAPILLARY
GLUCOSE-CAPILLARY: 149 mg/dL — AB (ref 65–99)
Glucose-Capillary: 125 mg/dL — ABNORMAL HIGH (ref 65–99)

## 2017-12-21 LAB — PROTIME-INR
INR: 3.01
PROTHROMBIN TIME: 31 s — AB (ref 11.4–15.2)

## 2017-12-21 MED ORDER — ALPRAZOLAM 0.25 MG PO TABS: 0.2500 mg | ORAL_TABLET | Freq: Two times a day (BID) | ORAL | Status: DC | PRN

## 2017-12-21 MED ORDER — CARVEDILOL 12.5 MG PO TABS: 6.2500 mg | ORAL_TABLET | Freq: Two times a day (BID) | ORAL | 3 refills | Status: DC

## 2017-12-21 NOTE — Discharge Summary (Signed)
Physician Discharge Summary  [name] NAT:[id number] DOB: 1940/08/03 DOA: 12/19/2017  PCP: Dorothyann Peng, NP  Admit date: 12/19/2017 Discharge date: 12/21/2017  Admitted From: Home Disposition: Home  Recommendations for Outpatient Follow-up:  1. Follow up with PCP in 1-2 weeks   Home Health: PT and RN Equipment/Devices: Has cane and walker at home  Discharge Condition: Fair CODE STATUS: Full code Diet recommendation: Heart healthy    Discharge Diagnoses:  Principal Problem:   Orthostatic hypotension  Active Problems:   PAF (paroxysmal atrial fibrillation) ([name])   Chronic diastolic congestive heart failure ([name])   Chest pain   Benign paroxysmal positional vertigo   History of pulmonary embolism   Mixed hyperlipidemia   GERD (gastroesophageal reflux disease)   Aortic valve stenosis   Essential hypertension, benign   Hyperglycemia   Numbness of face   CAD (coronary artery disease)   Peripheral neuropathy  Brief narrative/HPI 77 year old female with aortic stenosis, chronic back pain, CAD, chronic diastolic CHF, history of esophageal cancer, GERD, hiatal hernia, history of CVA, paroxysmal A. fib and pulmonary embolism x2 on Coumadin presented with chest tightness and shortness of breath since the afternoon of admission.  She reports pain to be sudden onset precordial nonradiating and worsened with movement.  She also reports some epigastric discomfort.  R also reported having right facial numbness on waking up on the morning of admission.  The numbness persisted during the day and had not resolved even when she came to the ED.  Reported some lightheadedness on trying to ambulate.  Recent change in her medications.  Denies any chest trauma or fall. In the ED vitals were stable except for elevated blood pressure of 180/72 mmHg. Patient given full dose aspirin, sublingual nitrate and 1 mg IV morphine in the ED.  Blood work showed normal CBC, therapeutic INR, mildly elevated  d-dimer of 0.70.  EKG showed A. fib at 83 bpm without any ST-T changes.  Initial troponin was negative.  Chemistry was unremarkable.  Chest x-ray negative except for chronic hiatal hernia.  Head CT without acute findings and showed old ischemic changes. Patient placed on observation in stepdown unit for chest pain rule out and stroke work-up.   Hospital course  Principal Problem: Orthostatic hypotension Home blood medications held.  Improved with IV fluids and orthostasis this morning has resolved.  Resume Lasix.  I will reduce the dose of Coreg to 6.25 mg twice daily.  She is also on weekly clonidine patch which needs to be tapered to discontinue.  Will discontinue amlodipine.  Seen by PT and recommends home health.    Chest pain, atypical Appears musculoskeletal and reproducible on exam.  Serial troponin negative.  Continue aspirin and statin.  2D echo with normal EF (65-70%), no wall motion abnormality, moderate left ear which is unchanged.  Active Problems: Right facial numbness  Has resolved.  Presented with vague symptoms.  Doubt TIA.  MRI brain negative for acute findings.  Carotid Doppler without significant stenosis.  2D echo with normal EF and no wall motion abnormality.  Continue aspirin and statin.   History of pulmonary embolism and paroxysmal A. fib Continue Coumadin.  INR therapeutic.  VQ scan was done which showed moderate probability for PE.  Right lower extremity Doppler was negative for DVT.  I highly doubt patient has pulmonary embolism and patient being on chronic Coumadin with therapeutic INR is reassuring.  Mixed hyperlipidemia Hold statin held  to mild transaminitis. Now resolved.  Iron deficiency anemia Continue supplement.  Peripheral neuropathy Continue Neurontin   Patient stable to be discharged home with outpatient follow-up.  Family Communication  : Friend at bedside  Disposition Plan  : Home   Consults  : None  Procedures  : CT head,  MRI brain, carotid ultrasound, Doppler right lower extremity, VQ scan, 2D echo    Discharge Instructions   Allergies as of 12/21/2017      Reactions   Iodinated Diagnostic Agents Anaphylaxis, Other (See Comments)   IPD dye Info given by patient   Ioxaglate Anaphylaxis, Other (See Comments)   Info given by patient   Milk-related Compounds Anaphylaxis, Other (See Comments)   Lactose intolerance Can tolerate milk if its cooked into the recipe, just can't drink milk    Red Dye Anaphylaxis   Whey Other (See Comments)   Lactose intolerance   Darvon [propoxyphene] Rash   Hydralazine Anxiety, Other (See Comments)   Facial flushing, pt prefers not to use it.       Medication List    STOP taking these medications   amLODipine 10 MG tablet Commonly known as:  NORVASC   doxycycline 100 MG capsule Commonly known as:  VIBRAMYCIN     TAKE these medications   acetaminophen 500 MG tablet Commonly known as:  TYLENOL Take 500-1,000 mg by mouth daily as needed for mild pain or moderate pain.   albuterol 108 (90 Base) MCG/ACT inhaler Commonly known as:  PROVENTIL HFA;VENTOLIN HFA Inhale 2 puffs into the lungs every 6 (six) hours as needed for wheezing or shortness of breath.   aspirin EC 81 MG tablet Take 1 tablet (81 mg total) by mouth daily.   carvedilol 12.5 MG tablet Commonly known as:  COREG TAKE half tablet (6.25 mg) BY MOUTH TWICE DAILY.   cloNIDine 0.2 mg/24hr patch Commonly known as:  CATAPRES - Dosed in mg/24 hr APPLY 1 PATCH ONTO THE SKIN ONCE WEEKLY.   cyanocobalamin 1000 MCG/ML injection Commonly known as:  (VITAMIN B-12) IM daily 11/29-12/3, then weekly x4, then monthly. What changed:    how much to take  how to take this  when to take this  additional instructions   ezetimibe-simvastatin 10-20 MG tablet Commonly known as:  VYTORIN Take 1 tablet by mouth daily.   ferrous sulfate 325 (65 FE) MG tablet Take 325 mg by mouth every evening.   FLINTSTONES  PLUS IRON chewable tablet Chew 2 tablets by mouth daily.   furosemide 40 MG tablet Commonly known as:  LASIX TAKE 1 TABLET BY MOUTH ONCE DAILY.   gabapentin 300 MG capsule Commonly known as:  NEURONTIN TAKE 1 CAPSULE BY MOUTH THREE TIMES A DAY. What changed:    how much to take  how to take this  when to take this  reasons to take this  additional instructions   meclizine 25 MG tablet Commonly known as:  ANTIVERT Take 25 mg by mouth as needed for dizziness or nausea.   pantoprazole 40 MG tablet Commonly known as:  PROTONIX TAKE 1 TABLET BY MOUTH TWICE DAILY.   temazepam 15 MG capsule Commonly known as:  RESTORIL TAKE (1) CAPSULE BY MOUTH AT BEDTIME.   warfarin 5 MG tablet Commonly known as:  COUMADIN Take as directed. If you are unsure how to take this medication, talk to your nurse or doctor. Original instructions:  TAKE 1/2 TABLET BY MOUTH ON WEDNESDAY AND 1 TABLET ALL OTHER DAYS OR AS DIRECTED BY ANTICOAGULATION CLINIC. What changed:    how much to take  how  to take this  when to take this  additional instructions      Follow-up Information    Dorothyann Peng, NP. Schedule an appointment as soon as possible for a visit in 1 week(s).   Specialty:  Family Medicine Contact information: [place] [address] [phone]        Pixie Casino, MD .   Specialty:  Cardiology Contact information: [address] [place] [address] [phone]          Allergies  Allergen Reactions  . Iodinated Diagnostic Agents Anaphylaxis and Other (See Comments)    IPD dye Info given by patient  . Ioxaglate Anaphylaxis and Other (See Comments)    Info given by patient  . Milk-Related Compounds Anaphylaxis and Other (See Comments)    Lactose intolerance Can tolerate milk if its cooked into the recipe, just can't drink milk   . Red Dye Anaphylaxis  . Whey Other (See Comments)    Lactose intolerance  . Darvon  [Propoxyphene] Rash  . Hydralazine Anxiety and Other (See Comments)    Facial flushing, pt prefers not to use it.         Procedures/Studies: Ct Head Wo Contrast  Result Date: 12/19/2017 CLINICAL DATA:  Chest pain and shortness of breath starting this morning. On Coumadin. EXAM: CT HEAD WITHOUT CONTRAST TECHNIQUE: Contiguous axial images were obtained from the base of the skull through the vertex without intravenous contrast. COMPARISON:  Head CT dated 01/12/2017. FINDINGS: Brain: Ventricles are stable in size and configuration. Very mild chronic small vessel ischemic change noted within the bilateral periventricular white matter regions. Small old lacunar infarct is again noted within the RIGHT basal ganglia. Also again noted is an additional old infarct within the RIGHT cerebellum with associated encephalomalacia. There is no mass, hemorrhage, edema or other evidence of acute parenchymal abnormality. No extra-axial hemorrhage. Vascular: There are chronic calcified atherosclerotic changes of the large vessels at the skull base. No unexpected hyperdense vessel. Skull: Normal. Negative for fracture or focal lesion. Sinuses/Orbits: No acute finding. Other: None. IMPRESSION: 1. No acute findings.  No intracranial mass, hemorrhage or edema. 2. Chronic ischemic changes, as detailed above. Electronically Signed   By: Franki Cabot M.D.   On: 12/19/2017 16:48   Mr Jodene Nam Head Wo Contrast  Result Date: 12/20/2017 CLINICAL DATA:  Altered mental status for 2 days. Focal neuro deficit for greater than 6 hours. Stroke suspected. The patient presented to the emergency department yesterday for chest pain and shortness of breath. She also complains of right-sided facial numbness beginning when she woke up yesterday. EXAM: MRI HEAD WITHOUT CONTRAST MRA HEAD WITHOUT CONTRAST TECHNIQUE: Multiplanar, multiecho pulse sequences of the brain and surrounding structures were obtained without intravenous contrast. Angiographic  images of the head were obtained using MRA technique without contrast. COMPARISON:  CT head 12/19/2017.  MRI brain 08/24/2014. FINDINGS: MRI HEAD FINDINGS Brain: The diffusion-weighted images demonstrate no acute or subacute infarct. Remote infarct of the right cerebellum is noted. Remote lacunar infarcts of the right basal ganglia are stable. Mild atrophy and white matter changes are present otherwise. No acute hemorrhage or mass lesion is present. The internal auditory canals are within normal limits. The brainstem and cerebellum are otherwise within normal limits. Vascular: Flow is present in the major intracranial arteries. Skull and upper cervical spine: The skull base is within normal limits. The upper cervical spine is unremarkable. Sinuses/Orbits: The paranasal sinuses are clear. Mastoid air cells are clear. Bilateral  lens replacements are again noted. Globes and orbits are otherwise within normal limits. MRA HEAD FINDINGS The internal carotid arteries are within normal limits from the high cervical segments through the ICA termini bilaterally. Mild narrowing of the mid left M1 segment. A1 and M1 segments are otherwise within normal limits. The anterior communicating artery is patent. There is a moderate stenosis of the distal left A2 segment. MCA bifurcations are intact. Irregularity is present throughout ACA and MCA branch vessels without other proximal stenosis or occlusion. The left vertebral artery is slightly dominant to the right. PICA origins are visualized and normal. Basilar artery is normal. Both posterior cerebral arteries originate from the basilar tip. There is mild narrowing of the proximal P2 vessels bilaterally. PCA branch vessels are otherwise intact. IMPRESSION: 1. No acute intracranial abnormality. 2. Stable remote infarcts involving the right basal ganglia and right cerebellum. 3. Mild atrophy and white matter changes likely reflect the sequela of chronic microvascular ischemia, these  are stable. 4. Mild narrowing of the left M1 segment without a significant stenosis relative to the more distal vessel. 5. Moderate distal left A2 segment stenosis. 6. Mild narrowing of the proximal P2 segments bilaterally. 7. No other significant proximal stenosis, aneurysm, or branch vessel occlusion within the circle-of-Willis. Electronically Signed   By: San Morelle M.D.   On: 12/20/2017 09:04   Mr Brain Wo Contrast  Result Date: 12/20/2017 CLINICAL DATA:  Altered mental status for 2 days. Focal neuro deficit for greater than 6 hours. Stroke suspected. The patient presented to the emergency department yesterday for chest pain and shortness of breath. She also complains of right-sided facial numbness beginning when she woke up yesterday. EXAM: MRI HEAD WITHOUT CONTRAST MRA HEAD WITHOUT CONTRAST TECHNIQUE: Multiplanar, multiecho pulse sequences of the brain and surrounding structures were obtained without intravenous contrast. Angiographic images of the head were obtained using MRA technique without contrast. COMPARISON:  CT head 12/19/2017.  MRI brain 08/24/2014. FINDINGS: MRI HEAD FINDINGS Brain: The diffusion-weighted images demonstrate no acute or subacute infarct. Remote infarct of the right cerebellum is noted. Remote lacunar infarcts of the right basal ganglia are stable. Mild atrophy and white matter changes are present otherwise. No acute hemorrhage or mass lesion is present. The internal auditory canals are within normal limits. The brainstem and cerebellum are otherwise within normal limits. Vascular: Flow is present in the major intracranial arteries. Skull and upper cervical spine: The skull base is within normal limits. The upper cervical spine is unremarkable. Sinuses/Orbits: The paranasal sinuses are clear. Mastoid air cells are clear. Bilateral lens replacements are again noted. Globes and orbits are otherwise within normal limits. MRA HEAD FINDINGS The internal carotid arteries are  within normal limits from the high cervical segments through the ICA termini bilaterally. Mild narrowing of the mid left M1 segment. A1 and M1 segments are otherwise within normal limits. The anterior communicating artery is patent. There is a moderate stenosis of the distal left A2 segment. MCA bifurcations are intact. Irregularity is present throughout ACA and MCA branch vessels without other proximal stenosis or occlusion. The left vertebral artery is slightly dominant to the right. PICA origins are visualized and normal. Basilar artery is normal. Both posterior cerebral arteries originate from the basilar tip. There is mild narrowing of the proximal P2 vessels bilaterally. PCA branch vessels are otherwise intact. IMPRESSION: 1. No acute intracranial abnormality. 2. Stable remote infarcts involving the right basal ganglia and right cerebellum. 3. Mild atrophy and white matter changes likely reflect the  sequela of chronic microvascular ischemia, these are stable. 4. Mild narrowing of the left M1 segment without a significant stenosis relative to the more distal vessel. 5. Moderate distal left A2 segment stenosis. 6. Mild narrowing of the proximal P2 segments bilaterally. 7. No other significant proximal stenosis, aneurysm, or branch vessel occlusion within the circle-of-Willis. Electronically Signed   By: San Morelle M.D.   On: 12/20/2017 09:04   US Carotid Bilateral  Result Date: 12/20/2017 CLINICAL DATA:  Right-sided facial numbness for the past day. History of CAD and hypertension. EXAM: BILATERAL CAROTID DUPLEX ULTRASOUND TECHNIQUE: Pearline Cables scale imaging, color Doppler and duplex ultrasound were performed of bilateral carotid and vertebral arteries in the neck. COMPARISON:  None. FINDINGS: Criteria: Quantification of carotid stenosis is based on velocity parameters that correlate the residual internal carotid diameter with NASCET-based stenosis levels, using the diameter of the distal internal  carotid lumen as the denominator for stenosis measurement. The following velocity measurements were obtained: RIGHT ICA:  108/22 cm/sec CCA:  75/10 cm/sec SYSTOLIC ICA/CCA RATIO:  1.4 ECA:  91 cm/sec LEFT ICA:  106/22 cm/sec CCA:  25/85 cm/sec SYSTOLIC ICA/CCA RATIO:  1.2 ECA:  131 cm/sec RIGHT CAROTID ARTERY: There is a minimal to moderate amount of eccentric mixed echogenic plaque within the right carotid bulb (images 14 and 16), extending to involve the origin and proximal aspects of the right internal carotid artery (image 24), not resulting in elevated peak systolic velocities within the interrogated course the right internal carotid artery to suggest a hemodynamically significant stenosis. RIGHT VERTEBRAL ARTERY:  Antegrade Flow LEFT CAROTID ARTERY: There is a moderate to large amount of eccentric mixed echogenic plaque within the left carotid bulb (images 48 and 50), extending to involve the origin and proximal aspects of the left internal carotid artery (image 59), not resulting in elevated peak systolic velocities within the interrogated course of the left internal carotid artery to suggest a hemodynamically significant stenosis. LEFT VERTEBRAL ARTERY:  Antegrade flow IMPRESSION: Moderate to large amount of bilateral atherosclerotic plaque, left greater than right, not resulting in a hemodynamically significant stenosis within either internal carotid artery. Electronically Signed   By: Sandi Mariscal M.D.   On: 12/20/2017 10:17   Nm Pulmonary Perf And Vent  Result Date: 12/19/2017 CLINICAL DATA:  Chest pain and shortness of breath. Esophageal carcinoma. EXAM: NUCLEAR MEDICINE VENTILATION - PERFUSION LUNG SCAN TECHNIQUE: Ventilation images were obtained in multiple projections using inhaled aerosol Tc-76m DTPA. Perfusion images were obtained in multiple projections after intravenous injection of Tc-60m-MAA. RADIOPHARMACEUTICALS:  Thirty-three mCi of Tc-37m DTPA aerosol inhalation and 4.5 mCi Tc61m-MAA IV  COMPARISON:  Chest radiograph on 12/19/2017 FINDINGS: Ventilation: Heterogeneous distribution of radiopharmaceutical activity throughout both lungs. Numerous small perfusion defects are seen in the mid and lower lung zones bilaterally. Perfusion: Multiple small and moderate perfusion defects are seen in both mid and lower lung zones, which match the areas of ventilation abnormality. No definite large unmatched pulmonary perfusion defects are identified. IMPRESSION: Intermediate probability for pulmonary embolism. Electronically Signed   By: Earle Gell M.D.   On: 12/19/2017 20:46   US Venous Img Lower Right (dvt Study)  Result Date: 12/20/2017 CLINICAL DATA:  RIGHT lower extremity pain and edema for 1 day, elevated D-dimer, on Coumadin EXAM: RIGHT LOWER EXTREMITY VENOUS DOPPLER ULTRASOUND TECHNIQUE: Gray-scale sonography with graded compression, as well as color Doppler and duplex ultrasound were performed to evaluate the lower extremity deep venous systems from the level of the common femoral vein and including  the common femoral, femoral, profunda femoral, popliteal and calf veins including the posterior tibial, peroneal and gastrocnemius veins when visible. The superficial great saphenous vein was also interrogated. Spectral Doppler was utilized to evaluate flow at rest and with distal augmentation maneuvers in the common femoral, femoral and popliteal veins. COMPARISON:  None FINDINGS: Contralateral Common Femoral Vein: Respiratory phasicity is normal and symmetric with the symptomatic side. No evidence of thrombus. Normal compressibility. Common Femoral Vein: No evidence of thrombus. Normal compressibility, respiratory phasicity and response to augmentation. Saphenofemoral Junction: No evidence of thrombus. Normal compressibility and flow on color Doppler imaging. Profunda Femoral Vein: No evidence of thrombus. Normal compressibility and flow on color Doppler imaging. Femoral Vein: No evidence of thrombus.  Normal compressibility, respiratory phasicity and response to augmentation. Popliteal Vein: No evidence of thrombus. Normal compressibility, respiratory phasicity and response to augmentation. Calf Veins: No evidence of thrombus. Normal compressibility and flow on color Doppler imaging. Superficial Great Saphenous Vein: No evidence of thrombus. Normal compressibility. Venous Reflux:  None. Other Findings:  None. IMPRESSION: No evidence of deep venous thrombosis in the RIGHT lower extremity. Electronically Signed   By: Lavonia Dana M.D.   On: 12/20/2017 09:45   Dg Chest Portable 1 View  Result Date: 12/19/2017 CLINICAL DATA:  Chest pain and shortness of breath EXAM: PORTABLE CHEST 1 VIEW COMPARISON:  11/02/2016 FINDINGS: Normal heart size. There is superimposed mediastinal density from a hiatal hernia. Stable aortic and hilar contours. Mild interstitial coarsening in the setting of lower lung volumes. No Kerley lines, air bronchogram, effusion, or pneumothorax. IMPRESSION: 1. No acute finding. 2. Hiatal hernia. Electronically Signed   By: Monte Fantasia M.D.   On: 12/19/2017 15:19    2D echo Study Conclusions  - Left ventricle: The cavity size was normal. Wall thickness was   increased in a pattern of mild LVH. Systolic function was   vigorous. The estimated ejection fraction was in the range of 65%   to 70%. Wall motion was normal; there were no regional wall   motion abnormalities. The study was not technically sufficient to   allow evaluation of LV diastolic dysfunction due to atrial   fibrillation. Doppler parameters are consistent with high   ventricular filling pressure. - Aortic valve: Moderately calcified annulus. Trileaflet;   moderately calcified leaflets. There was moderate stenosis. Valve   area (VTI): 0.9 cm^2. Valve area (Vmax): 0.87 cm^2. Valve area   (Vmean): 0.95 cm^2. - Mitral valve: Severely calcified annulus. Normal thickness   leaflets . There was mild regurgitation. -  Left atrium: The atrium was moderately to severely dilated. - Tricuspid valve: There was mild regurgitation.   Subjective: Reports some pain in her right leg which is chronic from her neuropathy.  No further facial numbness or chest discomfort.  Stable overnight.  Discharge Exam: Vitals:   12/21/17 0700 12/21/17 0800  BP:  116/75  Pulse: 68 77  Resp: 16 14  Temp:  97.8 F (36.6 C)  SpO2: 92% 94%   Vitals:   12/21/17 0500 12/21/17 0600 12/21/17 0700 12/21/17 0800  BP:    116/75  Pulse:  64 68 77  Resp:  15 16 14   Temp:    97.8 F (36.6 C)  TempSrc:    Oral  SpO2:  92% 92% 94%  Weight: 53 kg (116 lb 13.5 oz)   53.8 kg (118 lb 9.7 oz)  Height:        General: Elderly female not in distress HEENT: Moist mucosa, supple  neck Chest: Clear bilaterally CVs: S1 and S2 regular, no murmurs GI: Soft, nondistended, nontender Musculoskeletal: Warm, no edema    The results of significant diagnostics from this hospitalization (including imaging, microbiology, ancillary and laboratory) are listed below for reference.     Microbiology: Recent Results (from the past 240 hour(s))  MRSA PCR Screening     Status: None   Collection Time: 12/19/17  8:33 PM  Result Value Ref Range Status   MRSA by PCR NEGATIVE NEGATIVE Final    Comment:        The GeneXpert MRSA Assay (FDA approved for NASAL specimens only), is one component of a comprehensive MRSA colonization surveillance program. It is not intended to diagnose MRSA infection nor to guide or monitor treatment for MRSA infections. Performed at [facility], [address], [place], [place] [address]      Labs: BNP (last 3 results) No results for input(s): BNP in the last 8760 hours. Basic Metabolic Panel: Recent Labs  Lab 12/19/17 1508 12/19/17 1820 12/20/17 0704  NA 139  --  141  K 4.1  --  4.0  CL 102  --  104  CO2 30  --  30  GLUCOSE 121*  --  125*  BUN 14  --  13  CREATININE 0.76  --  0.81  CALCIUM 8.8*  --   8.2*  MG  --  2.0  --   PHOS  --  4.3  --    Liver Function Tests: Recent Labs  Lab 12/20/17 0704 12/21/17 0539  AST 76* 33  ALT 64* 47  ALKPHOS 101 95  BILITOT 1.0 0.6  PROT 5.8* 5.6*  ALBUMIN 3.2* 3.0*   No results for input(s): LIPASE, AMYLASE in the last 168 hours. No results for input(s): AMMONIA in the last 168 hours. CBC: Recent Labs  Lab 12/19/17 1508 12/20/17 0704  WBC 5.9 4.0  NEUTROABS 3.0  --   HGB 13.3 11.6*  HCT 38.7 34.9*  MCV 87.4 88.4  PLT 142* 125*   Cardiac Enzymes: Recent Labs  Lab 12/19/17 1508 12/19/17 1820 12/20/17 0101 12/20/17 0704  TROPONINI <0.03 <0.03 <0.03 <0.03   BNP: Invalid input(s): POCBNP CBG: Recent Labs  Lab 12/20/17 0755 12/20/17 1147 12/20/17 1706 12/20/17 2137 12/21/17 0755  GLUCAP 128* 114* 133* 203* 125*   D-Dimer Recent Labs    12/19/17 1508  DDIMER 0.70*   Hgb A1c No results for input(s): HGBA1C in the last 72 hours. Lipid Profile No results for input(s): CHOL, HDL, LDLCALC, TRIG, CHOLHDL, LDLDIRECT in the last 72 hours. Thyroid function studies No results for input(s): TSH, T4TOTAL, T3FREE, THYROIDAB in the last 72 hours.  Invalid input(s): FREET3 Anemia work up No results for input(s): VITAMINB12, FOLATE, FERRITIN, TIBC, IRON, RETICCTPCT in the last 72 hours. Urinalysis    Component Value Date/Time   COLORURINE YELLOW 10/10/2016 1138   APPEARANCEUR CLEAR 10/10/2016 1138   LABSPEC 1.017 10/10/2016 1138   PHURINE 8.0 10/10/2016 1138   GLUCOSEU 50 (A) 10/10/2016 1138   HGBUR SMALL (A) 10/10/2016 1138   BILIRUBINUR NEGATIVE 10/10/2016 1138   KETONESUR NEGATIVE 10/10/2016 1138   PROTEINUR 30 (A) 10/10/2016 1138   UROBILINOGEN 0.2 05/04/2015 2308   NITRITE NEGATIVE 10/10/2016 1138   LEUKOCYTESUR NEGATIVE 10/10/2016 1138   Sepsis Labs Invalid input(s): PROCALCITONIN,  WBC,  LACTICIDVEN Microbiology Recent Results (from the past 240 hour(s))  MRSA PCR Screening     Status: None   Collection  Time: 12/19/17  8:33 PM  Result Value Ref Range Status   MRSA by PCR NEGATIVE NEGATIVE Final    Comment:        The GeneXpert MRSA Assay (FDA approved for NASAL specimens only), is one component of a comprehensive MRSA colonization surveillance program. It is not intended to diagnose MRSA infection nor to guide or monitor treatment for MRSA infections. Performed at [facility], [address] [place], [place] [address]      Time coordinating discharge:<30 minutes  SIGNED:   Louellen Molder, MD  [facility] 12/21/2017, 11:20 AM Pager   If 7PM-7AM, please contact night-coverage [email or web address] Password [email or web address]

## 2017-12-21 NOTE — Care Management Note (Signed)
Case Management Note  Patient Details  Name: [name] MRN: [id number] Date of Birth: August 25, 1940  Expected Discharge Date:  12/21/17               Expected Discharge Plan:  [place]  In-House Referral:  NA  Discharge planning Services  CM Consult  Post Acute Care Choice:  Home Health Choice offered to:  Patient  DME Arranged:    DME Agency:     HH Arranged:  RN, PT [place] Agency:  [facility]  Status of Service:  Completed, signed off  If discussed at [place] of Stay Meetings, dates discussed:    Additional Comments: DC home today with [facility] referral. Pt requests [facility], she is aware HH has 48 hrs ot make first visit. Juliann Pulse, [facility] rep, aware of referral and DC today.   Sherald Barge, RN 12/21/2017, 1:10 PM

## 2017-12-21 NOTE — Progress Notes (Signed)
ANTICOAGULATION CONSULT NOTE  Pharmacy Consult for Warfarin Indication: atrial fibrillation   Patient Measurements: Height: 5\' 2"  (157.5 cm) Weight: 118 lb 9.7 oz (53.8 kg) IBW/kg (Calculated) : 50.1 Heparin Dosing Weight:   Vital Signs: Temp: 97.8 F (36.6 C) (06/21 0800) Temp Source: Oral (06/21 0800) BP: 116/75 (06/21 0800) Pulse Rate: 77 (06/21 0800)  Labs: Recent Labs    12/19/17 1508 12/19/17 1820 12/20/17 0101 12/20/17 0704 12/21/17 0539  HGB 13.3  --   --  11.6*  --   HCT 38.7  --   --  34.9*  --   PLT 142*  --   --  125*  --   LABPROT 23.1*  --   --  25.9* 31.0*  INR 2.06  --   --  2.39 3.01  CREATININE 0.76  --   --  0.81  --   TROPONINI <0.03 <0.03 <0.03 <0.03  --    Estimated Creatinine Clearance: 45.3 mL/min (by C-G formula based on SCr of 0.81 mg/dL).    Home Warfarin 2.5mg  M-W-F-Sat                               5mg  Tue-Thur-Sun  Assessment: INR above desired goal range. Patient is highly sensitive to warfarin, as evidenced by INR jump from 2.06 to 2.39 to 3.01 in 48-hr time period.    Goal of Therapy:  INR 2-3 Monitor platelets by anticoagulation protocol: Yes   Plan:  HOLD warfarin today, as patient is highly sensitive to warfarin. Would recommend starting patient at 2.5mg  dose when INR <3.0 Daily PT / INR Monitor for signs and symptoms of bleeding.   Despina Pole, [facility] 12/21/2017,12:06 PM

## 2017-12-21 NOTE — Progress Notes (Signed)
Physical Therapy Treatment Patient Details Name: [name] MRN: [id number] DOB: 1941/05/03 Today's Date: 12/21/2017    History of Present Illness [name] is a 77 y.o. female with medical history significant of previous anemia, aortic stenosis, osteoarthritis, chronic back pain, coronary artery disease, chronic diastolic CHF, history of esophageal cancer, GERD, hiatal hernia with a ructions, history of CVA, history of paroxysmal atrial fibrillation, history of pulmonary emboli x2 who is coming to the emergency department due to chest pain as of breath since about 1300.  She describes the pain as a sudden onset, precordial pressure, nonradiating, with occasional sharp exacerbations..  She also has some epigastric discomfort, but this denies feeling dyspepsia after lunch, which she had already had when the pain started.  The pain worsens with deep inspiration.  Associated symptoms are mild dyspnea and mild nausea.  No palpitations, diaphoresis, PND or orthopnea.  She gets frequent lower extremity pitting edema.  complains of epigastric abdominal pain, which she did notice while I was examining her.  No vomiting, diarrhea, constipation, melena or hematochezia.  No dysuria, frequency or hematuria.  Denies polyuria, polydipsia, polyphagia, or blurred vision.    PT Comments    Patient demonstrates improved balance for gait training and able to ambulate without using RW, however, had episodes of occasional drifting left/right, limited arm swing and encouraged to use her RW when she returns home.  Patient will benefit from continued physical therapy in hospital and recommended venue below to increase strength, balance, endurance for safe ADLs and gait.    Follow Up Recommendations  Home health PT;Supervision - Intermittent     Equipment Recommendations  None recommended by PT    Recommendations for Other Services       Precautions / Restrictions Precautions Precautions:  Fall Restrictions Weight Bearing Restrictions: No    Mobility  Bed Mobility               General bed mobility comments: Patient presents sitting up in chair  Transfers Overall transfer level: Modified independent Equipment used: None Transfers: Sit to/from [facility] Sit to Stand: Modified independent (Device/Increase time) Stand pivot transfers: Modified independent (Device/Increase time)       General transfer comment: slightly labored movement with increased time  Ambulation/Gait Ambulation/Gait assistance: Supervision Gait Distance (Feet): 100 Feet Assistive device: None Gait Pattern/deviations: Decreased step length - right;Decreased step length - left;Decreased stride length;Drifts right/left Gait velocity: decreased   General Gait Details: slightly unsteady with occasional drifting right/left without loss of balance, aware of having to increase armswing    Stairs             Wheelchair Mobility    Modified Rankin (Stroke Patients Only)       Balance Overall balance assessment: Mild deficits observed, not formally tested                                          Cognition Arousal/Alertness: Awake/alert Behavior During Therapy: WFL for tasks assessed/performed Overall Cognitive Status: Within Functional Limits for tasks assessed                                        Exercises General Exercises - Lower Extremity Long Arc Quad: Seated;AROM;Strengthening;Both;10 reps Hip Flexion/Marching: Seated;AROM;Strengthening;10 reps;Both Toe Raises: Seated;AROM;Strengthening;10 reps;Both Heel Raises:  Seated;AROM;Strengthening;Both;10 reps    General Comments        Pertinent Vitals/Pain Pain Assessment: 0-10 Pain Score: 8  Pain Location: arthritic pain low back and hands Pain Descriptors / Indicators: Aching Pain Intervention(s): Limited activity within patient's tolerance;Monitored during session     Home Living                      Prior Function            PT Goals (current goals can now be found in the care plan section) Acute Rehab PT Goals Patient Stated Goal: return home feeling normal PT Goal Formulation: With patient Time For Goal Achievement: 12/22/17 Potential to Achieve Goals: Good Progress towards PT goals: Progressing toward goals    Frequency    Min 3X/week      PT Plan Current plan remains appropriate    Co-evaluation              AM-PAC PT "6 Clicks" Daily Activity  Outcome Measure  Difficulty turning over in bed (including adjusting bedclothes, sheets and blankets)?: None Difficulty moving from lying on back to sitting on the side of the bed? : None Difficulty sitting down on and standing up from a chair with arms (e.g., wheelchair, bedside commode, etc,.)?: None Help needed moving to and from a bed to chair (including a wheelchair)?: None Help needed walking in hospital room?: A Little Help needed climbing 3-5 steps with a railing? : A Little 6 Click Score: 22    End of Session   Activity Tolerance: Patient tolerated treatment well;Patient limited by fatigue Patient left: in chair;with call bell/phone within reach Nurse Communication: Mobility status PT Visit Diagnosis: Unsteadiness on feet (R26.81);Other abnormalities of gait and mobility (R26.89);Muscle weakness (generalized) (M62.81)     Time: 1020-1047 PT Time Calculation (min) (ACUTE ONLY): 27 min  Charges:  $Gait Training: 8-22 mins $Therapeutic Exercise: 8-22 mins                    G Codes:       12:23 PM, 2018-01-08 Lonell Grandchild, MPT Physical Therapist with [facility] [phone] office [phone] mobile phone

## 2017-12-21 NOTE — Discharge Instructions (Signed)
Orthostatic Hypotension Orthostatic hypotension is a sudden drop in blood pressure that happens when you quickly change positions, such as when you get up from a seated or lying position. Blood pressure is a measurement of how strongly, or weakly, your blood is pressing against the walls of your arteries. Arteries are blood vessels that carry blood from your heart throughout your body. When blood pressure is too low, you may not get enough blood to your brain or to the rest of your organs. This can cause weakness, light-headedness, rapid heartbeat, and fainting. This can last for just a few seconds or for up to a few minutes. Orthostatic hypotension is usually not a serious problem. However, if it happens frequently or gets worse, it may be a sign of something more serious. What are the causes? This condition may be caused by:  Sudden changes in posture, such as standing up quickly after you have been sitting or lying down.  Blood loss.  Loss of body fluids (dehydration).  Heart problems.  Hormone (endocrine) problems.  Pregnancy.  Severe infection.  Lack of certain nutrients.  Severe allergic reactions (anaphylaxis).  Certain medicines, such as blood pressure medicine or medicines that make the body lose excess fluids (diuretics). Sometimes, this condition can be caused by not taking medicine as directed, such as taking too much of a certain medicine.  What increases the risk? Certain factors can make you more likely to develop orthostatic hypotension, including:  Age. Risk increases as you get older.  Conditions that affect the heart or the central nervous system.  Taking certain medicines, such as blood pressure medicine or diuretics.  Being pregnant.  What are the signs or symptoms? Symptoms of this condition may include:  Weakness.  Light-headedness.  Dizziness.  Blurred vision.  Fatigue.  Rapid heartbeat.  Fainting, in severe cases.  How is this  diagnosed? This condition is diagnosed based on:  Your medical history.  Your symptoms.  Your blood pressure measurement. Your health care provider will check your blood pressure when you are: ? Lying down. ? Sitting. ? Standing.  A blood pressure reading is recorded as two numbers, such as "120 over 80" (or 120/80). The first ("top") number is called the systolic pressure. It is a measure of the pressure in your arteries as your heart beats. The second ("bottom") number is called the diastolic pressure. It is a measure of the pressure in your arteries when your heart relaxes between beats. Blood pressure is measured in a unit called mm Hg. Healthy blood pressure for adults is 120/80. If your blood pressure is below 90/60, you may be diagnosed with hypotension. Other information or tests that may be used to diagnose orthostatic hypotension include:  Your other vital signs, such as your heart rate and temperature.  Blood tests.  Tilt table test. For this test, you will be safely secured to a table that moves you from a lying position to an upright position. Your heart rhythm and blood pressure will be monitored during the test.  How is this treated? Treatment for this condition may include:  Changing your diet. This may involve eating more salt (sodium) or drinking more water.  Taking medicines to raise your blood pressure.  Changing the dosage of certain medicines you are taking that might be lowering your blood pressure.  Wearing compression stockings. These stockings help to prevent blood clots and reduce swelling in your legs.  In some cases, you may need to go to the hospital for:    Fluid replacement. This means you will receive fluids through an IV tube.  Blood replacement. This means you will receive donated blood through an IV tube (transfusion).  Treating an infection or heart problems, if this applies.  Monitoring. You may need to be monitored while medicines that you  are taking wear off.  Follow these instructions at home: Eating and drinking   Drink enough fluid to keep your urine clear or pale yellow.  Eat a healthy diet and follow instructions from your health care provider about eating or drinking restrictions. A healthy diet includes: ? Fresh fruits and vegetables. ? Whole grains. ? Lean meats. ? Low-fat dairy products.  Eat extra salt only as directed. Do not add extra salt to your diet unless your health care provider told you to do that.  Eat frequent, small meals.  Avoid standing up suddenly after eating. Medicines  Take over-the-counter and prescription medicines only as told by your health care provider. ? Follow instructions from your health care provider about changing the dosage of your current medicines, if this applies. ? Do not stop or adjust any of your medicines on your own. General instructions  Wear compression stockings as told by your health care provider.  Get up slowly from lying down or sitting positions. This gives your blood pressure a chance to adjust.  Avoid hot showers and excessive heat as directed by your health care provider.  Return to your normal activities as told by your health care provider. Ask your health care provider what activities are safe for you.  Do not use any products that contain nicotine or tobacco, such as cigarettes and e-cigarettes. If you need help quitting, ask your health care provider.  Keep all follow-up visits as told by your health care provider. This is important. Contact a health care provider if:  You vomit.  You have diarrhea.  You have a fever for more than 2-3 days.  You feel more thirsty than usual.  You feel weak and tired. Get help right away if:  You have chest pain.  You have a fast or irregular heartbeat.  You develop numbness in any part of your body.  You cannot move your arms or your legs.  You have trouble speaking.  You become sweaty or feel  lightheaded.  You faint.  You feel short of breath.  You have trouble staying awake.  You feel confused. This information is not intended to replace advice given to you by your health care provider. Make sure you discuss any questions you have with your health care provider. Document Released: 06/09/2002 Document Revised: 03/07/2016 Document Reviewed: 12/10/2015 Elsevier Interactive Patient Education  2018 Elsevier Inc.  

## 2017-12-24 ENCOUNTER — Telehealth: Payer: Self-pay | Admitting: Adult Health

## 2017-12-24 ENCOUNTER — Ambulatory Visit (INDEPENDENT_AMBULATORY_CARE_PROVIDER_SITE_OTHER): Payer: Medicare Other | Admitting: General Practice

## 2017-12-24 ENCOUNTER — Telehealth: Payer: Self-pay | Admitting: *Deleted

## 2017-12-24 ENCOUNTER — Ambulatory Visit: Payer: Self-pay | Admitting: *Deleted

## 2017-12-24 DIAGNOSIS — Z5181 Encounter for therapeutic drug level monitoring: Secondary | ICD-10-CM | POA: Diagnosis not present

## 2017-12-24 DIAGNOSIS — Z8673 Personal history of transient ischemic attack (TIA), and cerebral infarction without residual deficits: Secondary | ICD-10-CM | POA: Diagnosis not present

## 2017-12-24 DIAGNOSIS — I480 Paroxysmal atrial fibrillation: Secondary | ICD-10-CM | POA: Diagnosis not present

## 2017-12-24 DIAGNOSIS — I4891 Unspecified atrial fibrillation: Secondary | ICD-10-CM

## 2017-12-24 DIAGNOSIS — I2510 Atherosclerotic heart disease of native coronary artery without angina pectoris: Secondary | ICD-10-CM | POA: Diagnosis not present

## 2017-12-24 DIAGNOSIS — K219 Gastro-esophageal reflux disease without esophagitis: Secondary | ICD-10-CM | POA: Diagnosis not present

## 2017-12-24 DIAGNOSIS — Z85819 Personal history of malignant neoplasm of unspecified site of lip, oral cavity, and pharynx: Secondary | ICD-10-CM | POA: Diagnosis not present

## 2017-12-24 DIAGNOSIS — D649 Anemia, unspecified: Secondary | ICD-10-CM | POA: Diagnosis not present

## 2017-12-24 DIAGNOSIS — Z7951 Long term (current) use of inhaled steroids: Secondary | ICD-10-CM | POA: Diagnosis not present

## 2017-12-24 DIAGNOSIS — I350 Nonrheumatic aortic (valve) stenosis: Secondary | ICD-10-CM | POA: Diagnosis not present

## 2017-12-24 DIAGNOSIS — R079 Chest pain, unspecified: Secondary | ICD-10-CM | POA: Diagnosis not present

## 2017-12-24 DIAGNOSIS — Z7982 Long term (current) use of aspirin: Secondary | ICD-10-CM | POA: Diagnosis not present

## 2017-12-24 DIAGNOSIS — I951 Orthostatic hypotension: Secondary | ICD-10-CM | POA: Diagnosis not present

## 2017-12-24 DIAGNOSIS — E782 Mixed hyperlipidemia: Secondary | ICD-10-CM | POA: Diagnosis not present

## 2017-12-24 DIAGNOSIS — Z86711 Personal history of pulmonary embolism: Secondary | ICD-10-CM | POA: Diagnosis not present

## 2017-12-24 DIAGNOSIS — Z7901 Long term (current) use of anticoagulants: Secondary | ICD-10-CM | POA: Diagnosis not present

## 2017-12-24 DIAGNOSIS — G629 Polyneuropathy, unspecified: Secondary | ICD-10-CM | POA: Diagnosis not present

## 2017-12-24 DIAGNOSIS — I5032 Chronic diastolic (congestive) heart failure: Secondary | ICD-10-CM | POA: Diagnosis not present

## 2017-12-24 DIAGNOSIS — M1990 Unspecified osteoarthritis, unspecified site: Secondary | ICD-10-CM | POA: Diagnosis not present

## 2017-12-24 DIAGNOSIS — I110 Hypertensive heart disease with heart failure: Secondary | ICD-10-CM | POA: Diagnosis not present

## 2017-12-24 LAB — POCT INR: INR: 1.9 — AB (ref 2.0–3.0)

## 2017-12-24 NOTE — Telephone Encounter (Addendum)
Pt is coming in to see Dr Elease Hashimoto, in Cory's absence, on Wed 12/26/17 at 4:30p.  Will send to Both Dr Elease Hashimoto and Dorothyann Peng, NP to make aware.

## 2017-12-24 NOTE — Telephone Encounter (Signed)
Transition Care Management Follow-up Telephone Call  Per Discharge Summary:   Admit date: 12/19/2017 Discharge date: 12/21/2017  Admitted From: Home Disposition: Home  Recommendations for Outpatient Follow-up:  1. Follow up with PCP in 1-2 weeks   Home Health: PT and RN Equipment/Devices: Has cane and walker at home  Discharge Condition: Fair CODE STATUS: Full code Diet recommendation: Heart healthy  --   How have you been since you were released from the hospital? "Well, I've still been in [place] some and I still have some of the numbness."   Do you understand why you were in the hospital? yes   Do you understand the discharge instructions? yes   Where were you discharged to? Home   Items Reviewed:  Medications reviewed: yes  Allergies reviewed: yes  Dietary changes reviewed: no  Referrals reviewed: no   Functional Questionnaire:  Activities of Daily Living (ADLs):   She states they are independent in the following: not reviewed, patient's home health nurse is currently at the home completing assessment States they require assistance with the following: not reviewed, patient's home health nurse is currently at the home completing assessment   Any transportation issues/concerns?: no   Any patient concerns? Yes, [place] RN checked INR today and it is 1.9. Will send a note to [facility] for dosing.   Confirmed importance and date/time of follow-up visits scheduled yes  Provider Appointment booked with Dr. Carolann Littler 12/26/17 @ 3:30 pm due to PCP being out of office.  Confirmed with patient if condition begins to worsen call PCP or go to the ER.  Patient was given the office number and encouraged to call back with question or concerns.  : yes

## 2017-12-24 NOTE — Telephone Encounter (Signed)
Patient's [facility] nurse checked INR today and it is 1.9. Since patient was discharged from hospital on Friday she has taken 1 tablet Friday night and 0.5 tablet Saturday and Sunday.  Please contact patient for dosing instructions. Thanks!

## 2017-12-24 NOTE — Telephone Encounter (Signed)
Have her take Coreg 12.5 mg BID

## 2017-12-24 NOTE — Patient Instructions (Addendum)
Pre visit review using our clinic review tool, if applicable. No additional management support is needed unless otherwise documented below in the visit note.  Take 5 mg or 1 tablet today and then continue to take 1 tablet daily except for 1/2  tablet on Monday/Wed/Fridays/Saturdays.  Re-check in 1 week. Dosing instructions given to [place], RN @ [facility] while in patient's home.

## 2017-12-24 NOTE — Telephone Encounter (Signed)
Noted.  Patient has been contacted.  See anticoagulation note.

## 2017-12-24 NOTE — Telephone Encounter (Deleted)
Pt returning call, aware that we will call back shortly

## 2017-12-24 NOTE — Telephone Encounter (Signed)
[facility] had called in earlier with BP reading.   Had it routed to office.

## 2017-12-24 NOTE — Telephone Encounter (Signed)
Pt's [facility] nurse reports B/P 172/82, high for pt's baseline. NT called pt back and spoke to pt. States hospitalized 12/19/17(see encounter) and Amlodipine was discontinued as well as Coreg dosage "Cut in half." States taking coreg 12.5 QD, down from BID. Denies headache, dizziness, speech clear, denies weakness.  States symptoms from previous admission remain, not worsened.  Pt and [facility] nurse questioning if she should resume medications as previously ordered.   Pt has appt with Dr. Elease Hashimoto 12/26/17.  Please advise: [phone]    Reason for Disposition . Caller has NON-URGENT medication question about med that PCP prescribed and triager unable to answer question  Answer Assessment - Initial Assessment Questions 1. SYMPTOMS: "Do you have any symptoms?"     B/P elevated from baseline 172/82 2. SEVERITY: If symptoms are present, ask "Are they mild, moderate or severe?"     N/A  Protocols used: MEDICATION QUESTION CALL-A-AH

## 2017-12-24 NOTE — Progress Notes (Signed)
I have reviewed and agree with this plan  

## 2017-12-25 NOTE — Telephone Encounter (Addendum)
Spoke with patient, aware of recommendations/med changes per [facility] Pt aware that she needs to keep her upcoming appt with Dr Elease Hashimoto and they can further discuss her meds and BP I called [name] her [facility] nurse and discussed this with her as well.   Medlist updated to reflect changes  Will send to Dr Elease Hashimoto as Juluis Rainier.

## 2017-12-26 ENCOUNTER — Encounter: Payer: Self-pay | Admitting: Family Medicine

## 2017-12-26 ENCOUNTER — Inpatient Hospital Stay: Payer: Medicare Other | Admitting: Family Medicine

## 2017-12-26 ENCOUNTER — Encounter: Payer: Self-pay | Admitting: Physician Assistant

## 2017-12-26 ENCOUNTER — Ambulatory Visit (INDEPENDENT_AMBULATORY_CARE_PROVIDER_SITE_OTHER): Payer: Medicare Other | Admitting: Family Medicine

## 2017-12-26 ENCOUNTER — Ambulatory Visit (INDEPENDENT_AMBULATORY_CARE_PROVIDER_SITE_OTHER): Payer: Medicare Other | Admitting: Physician Assistant

## 2017-12-26 VITALS — BP 127/71 | HR 78 | Ht 62.0 in | Wt 116.2 lb

## 2017-12-26 VITALS — BP 118/72 | HR 86 | Temp 98.4°F | Wt 113.3 lb

## 2017-12-26 DIAGNOSIS — I480 Paroxysmal atrial fibrillation: Secondary | ICD-10-CM | POA: Diagnosis not present

## 2017-12-26 DIAGNOSIS — Z86711 Personal history of pulmonary embolism: Secondary | ICD-10-CM | POA: Diagnosis not present

## 2017-12-26 DIAGNOSIS — Z7901 Long term (current) use of anticoagulants: Secondary | ICD-10-CM | POA: Diagnosis not present

## 2017-12-26 DIAGNOSIS — R079 Chest pain, unspecified: Secondary | ICD-10-CM

## 2017-12-26 DIAGNOSIS — I10 Essential (primary) hypertension: Secondary | ICD-10-CM | POA: Diagnosis not present

## 2017-12-26 DIAGNOSIS — I951 Orthostatic hypotension: Secondary | ICD-10-CM

## 2017-12-26 DIAGNOSIS — R945 Abnormal results of liver function studies: Secondary | ICD-10-CM | POA: Diagnosis not present

## 2017-12-26 DIAGNOSIS — R7989 Other specified abnormal findings of blood chemistry: Secondary | ICD-10-CM

## 2017-12-26 DIAGNOSIS — E782 Mixed hyperlipidemia: Secondary | ICD-10-CM | POA: Diagnosis not present

## 2017-12-26 DIAGNOSIS — E538 Deficiency of other specified B group vitamins: Secondary | ICD-10-CM

## 2017-12-26 DIAGNOSIS — C160 Malignant neoplasm of cardia: Secondary | ICD-10-CM

## 2017-12-26 MED ORDER — CLONIDINE 0.1 MG/24HR TD PTWK: 0.1000 mg | MEDICATED_PATCH | TRANSDERMAL | 3 refills | Status: DC

## 2017-12-26 MED ORDER — CYANOCOBALAMIN 1000 MCG/ML IJ SOLN
1000.0000 ug | Freq: Once | INTRAMUSCULAR | Status: AC
  Administered 2017-12-26: 1000 ug via INTRAMUSCULAR

## 2017-12-26 NOTE — Progress Notes (Signed)
Subjective:     Patient ID: [name], female   DOB: 1941-01-13, 77 y.o.   MRN: [id number]  HPI Patient seen for hospital follow-up. We are seeing her today in the absence of her primary provider who is on vacation. She has chronic problems including history of atrial fibrillation, diastolic heart failure, hypertension, CAD, GERD, macular degeneration, hyperlipidemia, chronic Coumadin therapy. She was admitted on 12/19/17 with some chest tightness and shortness of breath since the day of admission. Sudden onset. Apparently also some epigastric discomfort. She also complains some right facial numbness on waking morning of admission without any associated weakness.  Blood pressure in ER 180/72. Patient was given multiple medications in ED. D-dimer minimally elevated 0.7. Troponin is negative. CT head no acute changes. No ischemic changes.  Patient apparently had some orthostatic symptoms during hospitalization. Initially reduced her Coreg to 6.25 mg daily. She is now back on 12.5 mg daily. There was mention of tapering off clonidine gradually. She is currently on Catapres 0.2 mg patch once weekly  No recurrent chest pains. Recent troponins negative. She remains on statin and aspirin. Echocardiogram no wall motion normality and ejection fraction 65-70%.  Patient apparently has had right facial numbness previously. MRI brain no acute findings. She had MR angiogram as well. Stable remote infarcts right basal ganglia and right cerebellum  Past Medical History:  Diagnosis Date  . Anemia 2005   Generally microcytic, transfusions in [address], 2012, 02/2015, 05/2015  . Aortic stenosis    Moderate November 2017  . Arthritis   . Broken back 2013   Chronic back pain.   [name] CAD (coronary artery disease)    Cardiac catheterization 2014 - 80% mid RCA and 70% OM managed medically  . CHF (congestive heart failure) ([place])   . Contrast media allergy   . Diastolic heart failure ([place])   . Esophageal cancer  ([place]) 05/06/2015   Adenocarcinoma GE junction  . GERD (gastroesophageal reflux disease)   . H/O iron deficiency    05-06-15 iron infusion ([facility])  . HH (hiatus hernia) 2008   Large with associated erosions  . Hypertension   . Paroxysmal atrial fibrillation ([name])   . PONV (postoperative nausea and vomiting)   . Pulmonary emboli ([place]) 2008, 2012  . Stroke [facility]) 1982   Past Surgical History:  Procedure Laterality Date  . APPENDECTOMY  1950s  . BACK SURGERY    . CARPAL TUNNEL RELEASE Bilateral 1990s  . [place] SURGERY  2014  . CHOLECYSTECTOMY  1980s   open  . COLONOSCOPY N/A 02/17/2015   Procedure: COLONOSCOPY;  Surgeon: [name], MD;  Location: [facility] ENDOSCOPY;  Service: Endoscopy;  Laterality: N/A;  . ESOPHAGOGASTRODUODENOSCOPY N/A 02/16/2015   Procedure: ESOPHAGOGASTRODUODENOSCOPY (EGD);  Surgeon: [name], MD;  Location: [facility];  Service: Endoscopy;  Laterality: N/A;  . ESOPHAGOGASTRODUODENOSCOPY N/A 05/06/2015   Procedure: ESOPHAGOGASTRODUODENOSCOPY (EGD);  Surgeon: [name], MD;  Location: [facility] ENDOSCOPY;  Service: Endoscopy;  Laterality: N/A;  . EUS N/A 05/20/2015   Procedure: UPPER ENDOSCOPIC ULTRASOUND (EUS) LINEAR;  Surgeon: [name], MD;  [facility]OS[facility];  Service: Endoscopy;  Laterality: N/A;  . exploratory lab  1950s or 1960s  . GIVENS CAPSULE STUDY N/A 05/06/2015   Procedure: GIVENS CAPSULE STUDY;  Surgeon: [name], MD;  Location: [facility] ENDOSCOPY;  Service: Endoscopy;  Laterality: N/A;  . LEFT HEART CATH AND CORONARY ANGIOGRAPHY N/A 11/08/2016   Procedure: Left Heart Cath and Coronary Angiography;  Surgeon: [name],  MD;  Location: [place] CV LAB;  Service: Cardiovascular;  Laterality: N/A;  . lumbar back surgery  2012  . [place] SURGERY  1991  . TONSILLECTOMY    . TONSILLECTOMY AND ADENOIDECTOMY  1960s    reports that she has never smoked. She has never used smokeless tobacco. She reports that she does not drink  alcohol or use drugs. family history includes Emphysema in her father; Heart disease in her mother; Other in her child; Ovarian cancer in her sister; Stroke in her mother and sister. Allergies  Allergen Reactions  . Iodinated Diagnostic Agents Anaphylaxis and Other (See Comments)    IPD dye Info given by patient  . Ioxaglate Anaphylaxis and Other (See Comments)    Info given by patient  . Milk-Related Compounds Anaphylaxis and Other (See Comments)    Lactose intolerance Can tolerate milk if its cooked into the recipe, just can't drink milk   . Red Dye Anaphylaxis  . Whey Other (See Comments)    Lactose intolerance  . Darvon [Propoxyphene] Rash  . Hydralazine Anxiety and Other (See Comments)    Facial flushing, pt prefers not to use it.      Review of Systems  Constitutional: Negative for appetite change, fatigue and unexpected weight change.  Eyes: Negative for visual disturbance.  Respiratory: Negative for cough, chest tightness, shortness of breath and wheezing.   Cardiovascular: Negative for chest pain, palpitations and leg swelling.  Neurological: Negative for dizziness, seizures, syncope, weakness, light-headedness and headaches.       Objective:   Physical Exam  Constitutional: She is oriented to person, place, and time. She appears well-developed and well-nourished.  Neck: Neck supple.  Cardiovascular: Normal rate.  Irregular rhythm but rate controlled  Pulmonary/Chest: Effort normal and breath sounds normal.  Musculoskeletal: She exhibits no edema.  Lymphadenopathy:    She has no cervical adenopathy.  Neurological: She is alert and oriented to person, place, and time.       Assessment:     #1 chronic atrial fibrillation on Coumadin and rate controlled  #2 recent chest pain ruled out for MI  #3 recent orthostatic hypotension. Blood pressure today by me seated 124/60 and standing 120/60  #4 history of pulmonary embolism on chronic Coumadin  #5 history of  B12 deficiency  #6 remote history of esophageal cancer      Plan:     -We discussed tapering back clonidine gradually. She's been on Catapres TTS 0.2 patch and recommend trial of going to 0.1 and monitor blood pressure closely. -Follow-up with primary in one month. If blood pressure stable at that time consider tapering off clonidine altogether. She will monitor blood pressure and be in touch of systolic consistently greater than 140. -Follow-up immediately for recurrent chest pain or other concerns  Eulas Post MD [facility]

## 2017-12-26 NOTE — Patient Instructions (Signed)
Medication Instructions: Your physician recommends that you continue on your current medications as directed. Please refer to the Current Medication list given to you today.   Follow-Up: Your physician recommends that you schedule a follow-up appointment in: 3 months with Dr. Debara Pickett.   Any Other Special Instructions are listed below:  Continue to monitor your blood pressure. Please call if your blood pressure does not stay below [phone] (systolic--top number).   If you need a refill on your cardiac medications before your next appointment, please call your pharmacy.

## 2017-12-26 NOTE — Patient Instructions (Signed)
Go ahead and decrease the Clonidine patch to the 0.1 mg dose and then follow up with Tommi Rumps in one month.

## 2017-12-26 NOTE — Progress Notes (Signed)
Cardiology Office Note   Date:  12/26/2017   ID:  [name], [name] 01/02/1941, MRN [id number]  PCP:  Dorothyann Peng, NP  Cardiologist: Dr. Debara Pickett, 09/10/2017 Rosaria Ferries, PA-C    History of Present Illness: [name] is a 77 y.o. female with a history of CAD w/ cath 2014 mRCA 80% & OM 70%>>med rx, mod AS echo 05/2016, anemia, D-CHF, GERD, esoph CA treated in [place], HTN, HL, PE x 2, CVA, atypical CP, chronic back pain  08/2017 office visit, under stress because her sister was in the hospital, A. fib rate controlled, therapeutic on Coumadin, BP well controlled, lipid-lowering changed to Vytorin for elevated LDL, recheck in 3 months Admitted 6/19-6/21/2019 for atypical chest pain, appears musculoskeletal, EF normal on echo, troponin negative.  Orthostatic hypotension noted although blood pressure was elevated, amlodipine discontinued and Coreg decreased, home health recommended, right facial numbness noted but MRI was negative for acute stroke, INR therapeutic.  Mild transaminitis on admission resolved, statin held  [name] presents for cardiology follow up.  She was in Afib this morning, she feels she is aware of it when it is out of rhythm. HR is generally 80-90 when in Afib.  She sometimes gets chest pain with the Afib, not all the time. Is not aware of an association between an elevated HR and her CP.  She is getting HA every day. It is a crescent HA in the back of her head. It is debilitating to her. The HA will last 5", more or less.   She also has numbness in the R side of her face. She does not have a Neurologist.  She has been taking her BP at home, no low readings. Does not remember all of them, but 163/88 w/ HR 88 is one.   Her Coreg had been decreased in the hospital because of lower BP. She was also told she had orthostatic hypotension.   Since she left the hospital, she has not been dizzy or light-headed. No orthostatic symptoms.   She recently  went back up to her previous dose on the Coreg, at 12.5 mg bid.    Past Medical History:  Diagnosis Date  . Anemia 2005   Generally microcytic, transfusions in [address], 2012, 02/2015, 05/2015  . Aortic stenosis    Moderate November 2017  . Arthritis   . Broken back 2013   Chronic back pain.   [name] CAD (coronary artery disease)    Cardiac catheterization 2014 - 80% mid RCA and 70% OM managed medically  . CHF (congestive heart failure) ([place])   . Contrast media allergy   . Diastolic heart failure ([place])   . Esophageal cancer ([place]) 05/06/2015   Adenocarcinoma GE junction  . GERD (gastroesophageal reflux disease)   . H/O iron deficiency    05-06-15 iron infusion ([facility])  . HH (hiatus hernia) 2008   Large with associated erosions  . Hypertension   . Paroxysmal atrial fibrillation ([name])   . PONV (postoperative nausea and vomiting)   . Pulmonary emboli ([place]) 2008, 2012  . Stroke [facility]) 1982    Past Surgical History:  Procedure Laterality Date  . APPENDECTOMY  1950s  . BACK SURGERY    . CARPAL TUNNEL RELEASE Bilateral 1990s  . [place] SURGERY  2014  . CHOLECYSTECTOMY  1980s   open  . COLONOSCOPY N/A 02/17/2015   Procedure: COLONOSCOPY;  Surgeon: [name], MD;  [facility];  Service: Endoscopy;  Laterality: N/A;  .  ESOPHAGOGASTRODUODENOSCOPY N/A 02/16/2015   Procedure: ESOPHAGOGASTRODUODENOSCOPY (EGD);  Surgeon: [name], MD;  Location: [facility];  Service: Endoscopy;  Laterality: N/A;  . ESOPHAGOGASTRODUODENOSCOPY N/A 05/06/2015   Procedure: ESOPHAGOGASTRODUODENOSCOPY (EGD);  Surgeon: [name], MD;  Location: [facility] ENDOSCOPY;  Service: Endoscopy;  Laterality: N/A;  . EUS N/A 05/20/2015   Procedure: UPPER ENDOSCOPIC ULTRASOUND (EUS) LINEAR;  Surgeon: [name], MD;  [facility] ENDOSCOPY;  Service: Endoscopy;  Laterality: N/A;  . exploratory lab  1950s or 1960s  . GIVENS CAPSULE STUDY N/A 05/06/2015   Procedure: GIVENS CAPSULE STUDY;  Surgeon: [name], MD;  Location: [facility] ENDOSCOPY;  Service: Endoscopy;  Laterality: N/A;  . LEFT HEART CATH AND CORONARY ANGIOGRAPHY N/A 11/08/2016   Procedure: Left Heart Cath and Coronary Angiography;  Surgeon: [name], MD;  Location: [facility] CV LAB;  Service: Cardiovascular;  Laterality: N/A;  . lumbar back surgery  2012  . [place] SURGERY  1991  . TONSILLECTOMY    . TONSILLECTOMY AND ADENOIDECTOMY  1960s    Current Outpatient Medications  Medication Sig Dispense Refill  . acetaminophen (TYLENOL) 500 MG tablet Take 500-1,000 mg by mouth daily as needed for mild pain or moderate pain.     [name] albuterol (PROVENTIL HFA;VENTOLIN HFA) 108 (90 Base) MCG/ACT inhaler Inhale 2 puffs into the lungs every 6 (six) hours as needed for wheezing or shortness of breath. 1 Inhaler 2  . aspirin EC 81 MG tablet Take 1 tablet (81 mg total) by mouth daily. 90 tablet 3  . carvedilol (COREG) 12.5 MG tablet Take 0.5 tablets (6.25 mg total) by mouth 2 (two) times daily with a meal. (Patient taking differently: Take 12.5 mg by mouth 2 (two) times daily with a meal. ) 180 tablet 3  . cloNIDine (CATAPRES - DOSED IN MG/24 HR) 0.2 mg/24hr patch APPLY 1 PATCH ONTO THE SKIN ONCE WEEKLY. 4 patch 6  . cyanocobalamin (,VITAMIN B-12,) 1000 MCG/ML injection IM daily 11/29-12/3, then weekly x4, then monthly. (Patient taking differently: Inject 1,000 mcg into the muscle every 30 (thirty) days. ) 9 mL 0  . ezetimibe-simvastatin (VYTORIN) 10-20 MG tablet Take 1 tablet by mouth daily. 30 tablet 6  . ferrous sulfate 325 (65 FE) MG tablet Take 325 mg by mouth every evening.     . furosemide (LASIX) 40 MG tablet TAKE 1 TABLET BY MOUTH ONCE DAILY. 90 tablet 1  . gabapentin (NEURONTIN) 300 MG capsule TAKE 1 CAPSULE BY MOUTH THREE TIMES A DAY. (Patient taking differently: Take 300 mg by mouth daily as needed (for pain). PRESCRIBED 1 CAPSULE BY MOUTH THREE TIMES A DAY.) 270 capsule 1  . meclizine (ANTIVERT) 25 MG tablet Take 25 mg by mouth  as needed for dizziness or nausea.     . pantoprazole (PROTONIX) 40 MG tablet TAKE 1 TABLET BY MOUTH TWICE DAILY. 180 tablet 2  . Pediatric Multivitamins-Iron (FLINTSTONES PLUS IRON) chewable tablet Chew 2 tablets by mouth daily. 60 tablet 0  . temazepam (RESTORIL) 15 MG capsule TAKE (1) CAPSULE BY MOUTH AT BEDTIME. 30 capsule 0  . warfarin (COUMADIN) 5 MG tablet TAKE 1/2 TABLET BY MOUTH ON WEDNESDAY AND 1 TABLET ALL OTHER DAYS OR AS DIRECTED BY ANTICOAGULATION CLINIC. (Patient taking differently: Take 2.5-5 mg by mouth See admin instructions. 1/2 tablet on all days except on Sundays Tuesdays and Thursdays. Take 1 tablet on these days.) 90 tablet 1   No current facility-administered medications for this visit.  Allergies:   Iodinated diagnostic agents; Ioxaglate; Milk-related compounds; Red dye; Whey; Darvon [propoxyphene]; and Hydralazine    Social History:  The patient  reports that she has never smoked. She has never used smokeless tobacco. She reports that she does not drink alcohol or use drugs.   Family History:  The patient's family history includes Emphysema in her father; Heart disease in her mother; Other in her child; Ovarian cancer in her sister; Stroke in her mother and sister.    ROS:  Please see the history of present illness. All other systems are reviewed and negative.    PHYSICAL EXAM: VS:  BP 127/71   Pulse 78   Ht 5\' 2"  (1.575 m)   Wt 116 lb 3.2 oz (52.7 kg)   BMI 21.25 kg/m  , BMI Body mass index is 21.25 kg/m. GEN: Well nourished, well developed, female in no acute distress  HEENT: normal for age  Neck: no JVD, no carotid bruit, no masses Cardiac: RRR; 2/6 murmur, no rubs, or gallops Respiratory:  clear to auscultation bilaterally, normal work of breathing GI: soft, nontender, nondistended, + BS MS: no deformity or atrophy; no edema; distal pulses are 2+ in all 4 extremities   Skin: warm and dry, no rash Neuro:  Strength and sensation are intact Psych:  euthymic mood, full affect   EKG:  EKG is not ordered today.   ECHO: 06/201/2019 - Left ventricle: The cavity size was normal. Wall thickness was   increased in a pattern of mild LVH. Systolic function was   vigorous. The estimated ejection fraction was in the range of 65%   to 70%. Wall motion was normal; there were no regional wall   motion abnormalities. The study was not technically sufficient to   allow evaluation of LV diastolic dysfunction due to atrial   fibrillation. Doppler parameters are consistent with high   ventricular filling pressure. - Aortic valve: Moderately calcified annulus. Trileaflet;   moderately calcified leaflets. There was moderate stenosis. Valve   area (VTI): 0.9 cm^2. Valve area (Vmax): 0.87 cm^2. Valve area   (Vmean): 0.95 cm^2. - Mitral valve: Severely calcified annulus. Normal thickness   leaflets . There was mild regurgitation. - Left atrium: The atrium was moderately to severely dilated. - Tricuspid valve: There was mild regurgitation.    Recent Labs: 09/05/2017: TSH 1.18 12/19/2017: Magnesium 2.0 12/20/2017: BUN 13; Creatinine, Ser 0.81; Hemoglobin 11.6; Platelets 125; Potassium 4.0; Sodium 141 12/21/2017: ALT 47    Lipid Panel    Component Value Date/Time   CHOL 182 09/05/2017 1014   TRIG 109.0 09/05/2017 1014   HDL 53.80 09/05/2017 1014   CHOLHDL 3 09/05/2017 1014   VLDL 21.8 09/05/2017 1014   LDLCALC 107 (H) 09/05/2017 1014     Wt Readings from Last 3 Encounters:  12/26/17 116 lb 3.2 oz (52.7 kg)  12/21/17 118 lb 9.7 oz (53.8 kg)  11/30/17 118 lb (53.5 kg)     Other studies Reviewed: Additional studies/ records that were reviewed today include: Office notes, hospital records and testing.  ASSESSMENT AND PLAN:  1.  Chest pain: She has had no recurrence. -During her recent admission, cardiac enzymes were negative and ECG nonacute - No further work-up indicated at this time.  2.  Hypertension: She was having problems with  orthostatic hypotension when she was in the hospital, this has resolved.   - She is back on carvedilol at 12.5 mg twice daily, continue this -Blood pressure is well controlled today, no med  changes  3.  Right facial numbness: - She is encouraged to discuss this with Dr. Jarold Song  4.  Headaches: - She has also been getting headaches every day, also discuss this with Dr. Jarold Song.  5.  Elevated transaminases: - AST was 76 and ALT 64 on admission, the next day, they had normalized. --She has an appointment in July with her PCP, would recheck then.  - Review with Dr. Debara Pickett if they need to be checked sooner  6.  Hyperlipidemia: When she saw Dr. Debara Pickett in March, her simvastatin was changed to Vytorin - AST and ALT were mildly elevated 3 months later -Continue to follow  Current medicines are reviewed at length with the patient today.  The patient has concerns regarding medicines.  Concerns were addressed  The following changes have been made: No change  Labs/ tests ordered today include:  No orders of the defined types were placed in this encounter.    Disposition:   FU with Dr. Debara Pickett  Signed, Rosaria Ferries, PA-C  12/26/2017 3:15 PM    [place] Phone: [phone]; Fax: [phone]  This note was written with the assistance of speech recognition software. Please excuse any transcriptional errors.

## 2017-12-28 DIAGNOSIS — I110 Hypertensive heart disease with heart failure: Secondary | ICD-10-CM | POA: Diagnosis not present

## 2017-12-28 DIAGNOSIS — I480 Paroxysmal atrial fibrillation: Secondary | ICD-10-CM | POA: Diagnosis not present

## 2017-12-28 DIAGNOSIS — I2510 Atherosclerotic heart disease of native coronary artery without angina pectoris: Secondary | ICD-10-CM | POA: Diagnosis not present

## 2017-12-28 DIAGNOSIS — I951 Orthostatic hypotension: Secondary | ICD-10-CM | POA: Diagnosis not present

## 2017-12-28 DIAGNOSIS — I5032 Chronic diastolic (congestive) heart failure: Secondary | ICD-10-CM | POA: Diagnosis not present

## 2017-12-28 DIAGNOSIS — R079 Chest pain, unspecified: Secondary | ICD-10-CM | POA: Diagnosis not present

## 2017-12-31 ENCOUNTER — Ambulatory Visit (INDEPENDENT_AMBULATORY_CARE_PROVIDER_SITE_OTHER): Payer: Medicare Other | Admitting: General Practice

## 2017-12-31 DIAGNOSIS — I951 Orthostatic hypotension: Secondary | ICD-10-CM | POA: Diagnosis not present

## 2017-12-31 DIAGNOSIS — Z7901 Long term (current) use of anticoagulants: Secondary | ICD-10-CM

## 2017-12-31 DIAGNOSIS — I4891 Unspecified atrial fibrillation: Secondary | ICD-10-CM

## 2017-12-31 DIAGNOSIS — I5032 Chronic diastolic (congestive) heart failure: Secondary | ICD-10-CM | POA: Diagnosis not present

## 2017-12-31 DIAGNOSIS — I480 Paroxysmal atrial fibrillation: Secondary | ICD-10-CM | POA: Diagnosis not present

## 2017-12-31 DIAGNOSIS — R079 Chest pain, unspecified: Secondary | ICD-10-CM | POA: Diagnosis not present

## 2017-12-31 DIAGNOSIS — I110 Hypertensive heart disease with heart failure: Secondary | ICD-10-CM | POA: Diagnosis not present

## 2017-12-31 DIAGNOSIS — I2510 Atherosclerotic heart disease of native coronary artery without angina pectoris: Secondary | ICD-10-CM | POA: Diagnosis not present

## 2017-12-31 LAB — POCT INR: INR: 2.4 (ref 2.0–3.0)

## 2017-12-31 NOTE — Patient Instructions (Signed)
Pre visit review using our clinic review tool, if applicable. No additional management support is needed unless otherwise documented below in the visit note.  Continue to take 1 tablet daily except for 1/2  tablet on Monday/Wed/Fridays/Saturdays.  Re-check in 2 weeks. Dosing instructions given to Christie Beckers, RN @ [facility] while in patient's home.

## 2018-01-02 ENCOUNTER — Telehealth: Payer: Self-pay | Admitting: Adult Health

## 2018-01-02 DIAGNOSIS — I480 Paroxysmal atrial fibrillation: Secondary | ICD-10-CM | POA: Diagnosis not present

## 2018-01-02 DIAGNOSIS — R079 Chest pain, unspecified: Secondary | ICD-10-CM | POA: Diagnosis not present

## 2018-01-02 DIAGNOSIS — I951 Orthostatic hypotension: Secondary | ICD-10-CM | POA: Diagnosis not present

## 2018-01-02 DIAGNOSIS — I5032 Chronic diastolic (congestive) heart failure: Secondary | ICD-10-CM | POA: Diagnosis not present

## 2018-01-02 DIAGNOSIS — I2510 Atherosclerotic heart disease of native coronary artery without angina pectoris: Secondary | ICD-10-CM | POA: Diagnosis not present

## 2018-01-02 DIAGNOSIS — I110 Hypertensive heart disease with heart failure: Secondary | ICD-10-CM | POA: Diagnosis not present

## 2018-01-02 NOTE — Telephone Encounter (Signed)
Copied from [place] [id number]. Topic: Inquiry >> Jan 02, 2018  1:45 PM Scherrie Gerlach wrote: Reason for CRM: Christie Beckers with [facility] nursing calling to advise pt 's bp is 98/58 and later 104/54. Want to know if the dr has any other suggestions to get her bp higher.  Low bp readings started last night. Pt has not picked up her new lower dose of patch, but it will be delivered today.  This should help per Tammy. Tammy is also advising pt to increase fluids. Advised Tommi Rumps out of the office, but his supervising physician, Dr Elease Hashimoto is in the office

## 2018-01-03 DIAGNOSIS — I951 Orthostatic hypotension: Secondary | ICD-10-CM | POA: Diagnosis not present

## 2018-01-03 DIAGNOSIS — I110 Hypertensive heart disease with heart failure: Secondary | ICD-10-CM | POA: Diagnosis not present

## 2018-01-03 DIAGNOSIS — I5032 Chronic diastolic (congestive) heart failure: Secondary | ICD-10-CM | POA: Diagnosis not present

## 2018-01-03 DIAGNOSIS — R079 Chest pain, unspecified: Secondary | ICD-10-CM | POA: Diagnosis not present

## 2018-01-03 DIAGNOSIS — I480 Paroxysmal atrial fibrillation: Secondary | ICD-10-CM | POA: Diagnosis not present

## 2018-01-03 DIAGNOSIS — I2510 Atherosclerotic heart disease of native coronary artery without angina pectoris: Secondary | ICD-10-CM | POA: Diagnosis not present

## 2018-01-04 NOTE — Telephone Encounter (Addendum)
Left a message for a return call.  Will have Tammy call cardiology and they last changed pt's medication.

## 2018-01-08 DIAGNOSIS — I480 Paroxysmal atrial fibrillation: Secondary | ICD-10-CM | POA: Diagnosis not present

## 2018-01-08 DIAGNOSIS — I5032 Chronic diastolic (congestive) heart failure: Secondary | ICD-10-CM | POA: Diagnosis not present

## 2018-01-08 DIAGNOSIS — I110 Hypertensive heart disease with heart failure: Secondary | ICD-10-CM | POA: Diagnosis not present

## 2018-01-08 DIAGNOSIS — R079 Chest pain, unspecified: Secondary | ICD-10-CM | POA: Diagnosis not present

## 2018-01-08 DIAGNOSIS — I2510 Atherosclerotic heart disease of native coronary artery without angina pectoris: Secondary | ICD-10-CM | POA: Diagnosis not present

## 2018-01-08 DIAGNOSIS — I951 Orthostatic hypotension: Secondary | ICD-10-CM | POA: Diagnosis not present

## 2018-01-08 NOTE — Telephone Encounter (Signed)
Left a message for Tammy to return my call.

## 2018-01-08 NOTE — Telephone Encounter (Signed)
Dr. Elease Hashimoto decrease the dose of her clonidine patch trials on vacation.  Let see how she is doing since this change

## 2018-01-08 NOTE — Telephone Encounter (Signed)
Spoke with Tammy with [facility] HH - States that the patient has not had any complaints. They have not checked her BP yet to see if this has been affected by the change in dosing but that they would start monitoring closely and if her BP changes they will call our office. Will send to [facility] as FYI

## 2018-01-08 NOTE — Telephone Encounter (Signed)
Tommi Rumps, would you like to make recommendations or cardiology.  Cardiology last changed medication.  Please advise.

## 2018-01-09 ENCOUNTER — Telehealth: Payer: Self-pay | Admitting: Adult Health

## 2018-01-09 NOTE — Telephone Encounter (Signed)
Copied from [place] [id number]. Topic: Quick Communication - See Telephone Encounter >> Jan 09, 2018  1:58 PM Mylinda Latina, NT wrote: CRM for notification. See Telephone encounter for: 01/09/18. Debbie with [facility] is calling and requesting verbals. The orders 1x a week for 1 week, 2x a week for 2 weeks.  Please call you with these verbals CB# [phone]

## 2018-01-10 DIAGNOSIS — R079 Chest pain, unspecified: Secondary | ICD-10-CM | POA: Diagnosis not present

## 2018-01-10 DIAGNOSIS — I5032 Chronic diastolic (congestive) heart failure: Secondary | ICD-10-CM | POA: Diagnosis not present

## 2018-01-10 DIAGNOSIS — I2510 Atherosclerotic heart disease of native coronary artery without angina pectoris: Secondary | ICD-10-CM | POA: Diagnosis not present

## 2018-01-10 DIAGNOSIS — I110 Hypertensive heart disease with heart failure: Secondary | ICD-10-CM | POA: Diagnosis not present

## 2018-01-10 DIAGNOSIS — I951 Orthostatic hypotension: Secondary | ICD-10-CM | POA: Diagnosis not present

## 2018-01-10 DIAGNOSIS — I480 Paroxysmal atrial fibrillation: Secondary | ICD-10-CM | POA: Diagnosis not present

## 2018-01-10 NOTE — Telephone Encounter (Signed)
Pt had hospital follow up with Dr. Elease Hashimoto.  Per Tommi Rumps, ok to proceed with PT orders.  [place] notified.  No further action required.

## 2018-01-11 DIAGNOSIS — I951 Orthostatic hypotension: Secondary | ICD-10-CM | POA: Diagnosis not present

## 2018-01-11 DIAGNOSIS — I110 Hypertensive heart disease with heart failure: Secondary | ICD-10-CM | POA: Diagnosis not present

## 2018-01-11 DIAGNOSIS — I5032 Chronic diastolic (congestive) heart failure: Secondary | ICD-10-CM | POA: Diagnosis not present

## 2018-01-11 DIAGNOSIS — R079 Chest pain, unspecified: Secondary | ICD-10-CM | POA: Diagnosis not present

## 2018-01-11 DIAGNOSIS — I2510 Atherosclerotic heart disease of native coronary artery without angina pectoris: Secondary | ICD-10-CM | POA: Diagnosis not present

## 2018-01-11 DIAGNOSIS — I480 Paroxysmal atrial fibrillation: Secondary | ICD-10-CM | POA: Diagnosis not present

## 2018-01-14 ENCOUNTER — Ambulatory Visit (INDEPENDENT_AMBULATORY_CARE_PROVIDER_SITE_OTHER): Payer: Medicare Other | Admitting: General Practice

## 2018-01-14 DIAGNOSIS — Z7901 Long term (current) use of anticoagulants: Secondary | ICD-10-CM

## 2018-01-14 DIAGNOSIS — I4891 Unspecified atrial fibrillation: Secondary | ICD-10-CM

## 2018-01-14 DIAGNOSIS — I480 Paroxysmal atrial fibrillation: Secondary | ICD-10-CM | POA: Diagnosis not present

## 2018-01-14 DIAGNOSIS — R079 Chest pain, unspecified: Secondary | ICD-10-CM | POA: Diagnosis not present

## 2018-01-14 DIAGNOSIS — I2510 Atherosclerotic heart disease of native coronary artery without angina pectoris: Secondary | ICD-10-CM | POA: Diagnosis not present

## 2018-01-14 DIAGNOSIS — I5032 Chronic diastolic (congestive) heart failure: Secondary | ICD-10-CM | POA: Diagnosis not present

## 2018-01-14 DIAGNOSIS — I951 Orthostatic hypotension: Secondary | ICD-10-CM | POA: Diagnosis not present

## 2018-01-14 DIAGNOSIS — I110 Hypertensive heart disease with heart failure: Secondary | ICD-10-CM | POA: Diagnosis not present

## 2018-01-14 LAB — POCT INR: INR: 2.7 (ref 2.0–3.0)

## 2018-01-14 NOTE — Patient Instructions (Addendum)
Pre visit review using our clinic review tool, if applicable. No additional management support is needed unless otherwise documented below in the visit note.  Continue to take 1 tablet daily except for 1/2  tablet on Monday/Wed/Fridays/Saturdays.  Re-check in 2 weeks. Dosing instructions given to Christie Beckers, RN @ [facility] while in patient's home.

## 2018-01-16 DIAGNOSIS — R079 Chest pain, unspecified: Secondary | ICD-10-CM | POA: Diagnosis not present

## 2018-01-16 DIAGNOSIS — I951 Orthostatic hypotension: Secondary | ICD-10-CM | POA: Diagnosis not present

## 2018-01-16 DIAGNOSIS — I2510 Atherosclerotic heart disease of native coronary artery without angina pectoris: Secondary | ICD-10-CM | POA: Diagnosis not present

## 2018-01-16 DIAGNOSIS — I480 Paroxysmal atrial fibrillation: Secondary | ICD-10-CM | POA: Diagnosis not present

## 2018-01-16 DIAGNOSIS — I5032 Chronic diastolic (congestive) heart failure: Secondary | ICD-10-CM | POA: Diagnosis not present

## 2018-01-16 DIAGNOSIS — I110 Hypertensive heart disease with heart failure: Secondary | ICD-10-CM | POA: Diagnosis not present

## 2018-01-18 ENCOUNTER — Telehealth: Payer: Self-pay | Admitting: *Deleted

## 2018-01-18 DIAGNOSIS — R079 Chest pain, unspecified: Secondary | ICD-10-CM | POA: Diagnosis not present

## 2018-01-18 DIAGNOSIS — I5032 Chronic diastolic (congestive) heart failure: Secondary | ICD-10-CM | POA: Diagnosis not present

## 2018-01-18 DIAGNOSIS — I951 Orthostatic hypotension: Secondary | ICD-10-CM | POA: Diagnosis not present

## 2018-01-18 DIAGNOSIS — I110 Hypertensive heart disease with heart failure: Secondary | ICD-10-CM | POA: Diagnosis not present

## 2018-01-18 DIAGNOSIS — I2510 Atherosclerotic heart disease of native coronary artery without angina pectoris: Secondary | ICD-10-CM | POA: Diagnosis not present

## 2018-01-18 DIAGNOSIS — I480 Paroxysmal atrial fibrillation: Secondary | ICD-10-CM | POA: Diagnosis not present

## 2018-01-18 NOTE — Telephone Encounter (Signed)
Copied from [place] [id number]. Topic: Inquiry >> Jan 18, 2018  3:21 PM [name] wrote: Reason for CRM: [facility] called to state pt is developing blisters on both legs; contact [phone]

## 2018-01-18 NOTE — Telephone Encounter (Signed)
Spoke to [place] and [facility].  She states she saw the pt today.  Blisters started on Mon-Tues.  One has popped and it was filled with a clear fluid.  Pt is not symptomatic.  No fever, rash, body aches etc.  Estill Bamberg asked her if she has been outside handling plants and pt denied.  Also asked her if she has new laundry soap, bath soap, lotions etc and pt denied this as well.  It is unclear why the blisters are on her legs.  Eunice Blase of the [facility] if she thinks the pt needs to be seen tomorrow.  Estill Bamberg will pass along the information to the pt.  No further action needed from [place].  Will send to [facility] as FYI.

## 2018-01-22 DIAGNOSIS — R079 Chest pain, unspecified: Secondary | ICD-10-CM | POA: Diagnosis not present

## 2018-01-22 DIAGNOSIS — I480 Paroxysmal atrial fibrillation: Secondary | ICD-10-CM | POA: Diagnosis not present

## 2018-01-22 DIAGNOSIS — I951 Orthostatic hypotension: Secondary | ICD-10-CM | POA: Diagnosis not present

## 2018-01-22 DIAGNOSIS — I5032 Chronic diastolic (congestive) heart failure: Secondary | ICD-10-CM | POA: Diagnosis not present

## 2018-01-22 DIAGNOSIS — I110 Hypertensive heart disease with heart failure: Secondary | ICD-10-CM | POA: Diagnosis not present

## 2018-01-22 DIAGNOSIS — I2510 Atherosclerotic heart disease of native coronary artery without angina pectoris: Secondary | ICD-10-CM | POA: Diagnosis not present

## 2018-01-23 DIAGNOSIS — I480 Paroxysmal atrial fibrillation: Secondary | ICD-10-CM | POA: Diagnosis not present

## 2018-01-23 DIAGNOSIS — I5032 Chronic diastolic (congestive) heart failure: Secondary | ICD-10-CM | POA: Diagnosis not present

## 2018-01-23 DIAGNOSIS — R079 Chest pain, unspecified: Secondary | ICD-10-CM | POA: Diagnosis not present

## 2018-01-23 DIAGNOSIS — I110 Hypertensive heart disease with heart failure: Secondary | ICD-10-CM | POA: Diagnosis not present

## 2018-01-23 DIAGNOSIS — I951 Orthostatic hypotension: Secondary | ICD-10-CM | POA: Diagnosis not present

## 2018-01-23 DIAGNOSIS — I2510 Atherosclerotic heart disease of native coronary artery without angina pectoris: Secondary | ICD-10-CM | POA: Diagnosis not present

## 2018-01-24 ENCOUNTER — Telehealth: Payer: Self-pay | Admitting: Family Medicine

## 2018-01-24 DIAGNOSIS — R079 Chest pain, unspecified: Secondary | ICD-10-CM | POA: Diagnosis not present

## 2018-01-24 DIAGNOSIS — I951 Orthostatic hypotension: Secondary | ICD-10-CM | POA: Diagnosis not present

## 2018-01-24 DIAGNOSIS — I480 Paroxysmal atrial fibrillation: Secondary | ICD-10-CM | POA: Diagnosis not present

## 2018-01-24 DIAGNOSIS — I110 Hypertensive heart disease with heart failure: Secondary | ICD-10-CM | POA: Diagnosis not present

## 2018-01-24 DIAGNOSIS — I2510 Atherosclerotic heart disease of native coronary artery without angina pectoris: Secondary | ICD-10-CM | POA: Diagnosis not present

## 2018-01-24 DIAGNOSIS — I5032 Chronic diastolic (congestive) heart failure: Secondary | ICD-10-CM | POA: Diagnosis not present

## 2018-01-24 NOTE — Telephone Encounter (Signed)
Copied from [place] [id number]. Topic: Quick Communication - See Telephone Encounter >> Jan 24, 2018  2:08 PM Hewitt Shorts wrote: Home health is calling to get  verbal orders for PT  1time a week for 4 weeks and would like to request an order for a UA  Best number is [phone]

## 2018-01-27 NOTE — Telephone Encounter (Signed)
[place] for verbal orders and UA

## 2018-01-28 ENCOUNTER — Telehealth: Payer: Self-pay | Admitting: Family Medicine

## 2018-01-28 NOTE — Telephone Encounter (Signed)
Copied from [place] [phone]. Topic: Quick Communication - See Telephone Encounter >> Jan 24, 2018  2:08 PM Hewitt Shorts wrote: Home health is calling to get  verbal orders for PT  1time a week for 4 weeks and would like to request an order for a UA  Best number is [phone] >> Jan 28, 2018  3:59 PM Oneta Rack wrote: [place] Human name: Debbie  Relation to pt: PT from [facility]  Call back number: [phone] Pharmacy:  Reason for call:  Verbal orders for PT 2x 2 and also following up of UA orders, please advise

## 2018-01-29 ENCOUNTER — Ambulatory Visit (INDEPENDENT_AMBULATORY_CARE_PROVIDER_SITE_OTHER): Payer: Medicare Other | Admitting: Adult Health

## 2018-01-29 ENCOUNTER — Encounter: Payer: Self-pay | Admitting: Adult Health

## 2018-01-29 VITALS — BP 154/84 | Temp 98.4°F | Wt 115.0 lb

## 2018-01-29 DIAGNOSIS — M545 Low back pain, unspecified: Secondary | ICD-10-CM

## 2018-01-29 DIAGNOSIS — I10 Essential (primary) hypertension: Secondary | ICD-10-CM

## 2018-01-29 LAB — POC URINALSYSI DIPSTICK (AUTOMATED)
Bilirubin, UA: NEGATIVE
Glucose, UA: NEGATIVE
Ketones, UA: NEGATIVE
LEUKOCYTES UA: NEGATIVE
Nitrite, UA: NEGATIVE
PH UA: 6 (ref 5.0–8.0)
PROTEIN UA: NEGATIVE
Spec Grav, UA: 1.025 (ref 1.010–1.025)
UROBILINOGEN UA: 0.2 U/dL

## 2018-01-29 MED ORDER — CARVEDILOL 25 MG PO TABS: 25.0000 mg | ORAL_TABLET | Freq: Two times a day (BID) | ORAL | 0 refills | Status: DC

## 2018-01-29 NOTE — Progress Notes (Signed)
Subjective:    Patient ID: [name], female    DOB: 1941-05-22, 77 y.o.   MRN: [id number]  HPI 77 year old female who  has a past medical history of Anemia (2005), Aortic stenosis, Arthritis, Broken back (2013), CAD (coronary artery disease), CHF (congestive heart failure) ([place]), Contrast media allergy, Diastolic heart failure ([place]), Esophageal cancer ([place]) (05/06/2015), GERD (gastroesophageal reflux disease), H/O iron deficiency, HH (hiatus hernia) (2008), Hypertension, Paroxysmal atrial fibrillation ([place]), PONV (postoperative nausea and vomiting), Pulmonary emboli ([place]) (2008, 2012), and Stroke [facility]) (1982).   She wants to the office today for follow-up regarding blood pressure as well as concern for possible UTI.  Hypertension -she was seen by another provider in the office status post hospital follow-up.  At this time clonidine patch was decreased from 0.2 mg to 0.1 mg weekly.  She is wanting to get off the clonidine patch completely.  She is also currently on Coreg 12.5 mg twice daily for BP control and rate control of a fib   Additionally she reports left lower back pain for the last 2 or 3 days.  She is concerned that she might have a urinary tract infection, her only other symptom is that of "darker urine" and.  Pain in her lower back is worse with movement such as changing position and sitting and turning, and weightbearing. She denies any trauma, burning with urination, frequency, or urgency   Review of Systems See HPI  Past Medical History:  Diagnosis Date  . Anemia 2005   Generally microcytic, transfusions in [address], 2012, 02/2015, 05/2015  . Aortic stenosis    Moderate November 2017  . Arthritis   . Broken back 2013   Chronic back pain.   [name] CAD (coronary artery disease)    Cardiac catheterization 2014 - 80% mid RCA and 70% OM managed medically  . CHF (congestive heart failure) ([place])   . Contrast media allergy   . Diastolic heart failure ([place])   . Esophageal cancer  ([place]) 05/06/2015   Adenocarcinoma GE junction  . GERD (gastroesophageal reflux disease)   . H/O iron deficiency    05-06-15 iron infusion ([facility])  . HH (hiatus hernia) 2008   Large with associated erosions  . Hypertension   . Paroxysmal atrial fibrillation ([name])   . PONV (postoperative nausea and vomiting)   . Pulmonary emboli ([place]) 2008, 2012  . Stroke [facility]) 1982    Social History   Socioeconomic History  . Marital status: Divorced    Spouse name: Not on file  . Number of children: 3  . Years of education: 86  . Highest education level: Not on file  Occupational History  . Occupation: Retired  Scientific laboratory technician  . Financial resource strain: Not on file  . Food insecurity:    Worry: Not on file    Inability: Not on file  . Transportation needs:    Medical: Not on file    Non-medical: Not on file  Tobacco Use  . Smoking status: Never Smoker  . Smokeless tobacco: Never Used  Substance and Sexual Activity  . Alcohol use: No    Alcohol/week: 0.0 oz  . Drug use: No  . Sexual activity: Not Currently  Lifestyle  . Physical activity:    Days per week: Not on file    Minutes per session: Not on file  . Stress: Not on file  Relationships  . Social connections:    Talks on phone: Not on file    Gets together:  Not on file    Attends religious service: Not on file    Active member of club or organization: Not on file    Attends meetings of clubs or organizations: Not on file    Relationship status: Not on file  . Intimate partner violence:    Fear of current or ex partner: Not on file    Emotionally abused: Not on file    Physically abused: Not on file    Forced sexual activity: Not on file  Other Topics Concern  . Not on file  Social History Narrative   Born and raised in [place], [place]. Currently resides in a house with her son. 1 dog. Fun: go to church   Divorced(Has total of #3 children)-[place], [place], [place]   Denies religious beliefs that would effect  health care.    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[employer]    Past Surgical History:  Procedure Laterality Date  . APPENDECTOMY  1950s  . BACK SURGERY    . CARPAL TUNNEL RELEASE Bilateral 1990s  . [place] SURGERY  2014  . CHOLECYSTECTOMY  1980s   open  . COLONOSCOPY N/A 02/17/2015   Procedure: COLONOSCOPY;  Surgeon: [name], MD;  [facility]: [facility] END[facility]COPY;  Service: Endoscopy;  Laterality: N/A;  . ESOPHAGOGASTRODUODENOSCOPY N/A 02/16/2015   Procedure: ESOPHAGOGASTRODUODENOSCOPY (EGD);  Surgeon: [name], MD;  Location: [facility];  Service: Endoscopy;  Laterality: N/A;  . ESOPHAGOGASTRODUODENOSCOPY N/A 05/06/2015   Procedure: ESOPHAGOGASTRODUODENOSCOPY (EGD);  Surgeon: [name], MD;  Location: [facility] ENDOSCOPY;  Service: Endoscopy;  Laterality: N/A;  . EUS N/A 05/20/2015   Procedure: UPPER ENDOSCOPIC ULTRASOUND (EUS) LINEAR;  Surgeon: [name], MD;  [facility] ENDOS[facility];  Service: Endoscopy;  Laterality: N/A;  . exploratory lab  1950s or 1960s  . GIVENS CAPSULE STUDY N/A 05/06/2015   Procedure: GIVENS CAPSULE STUDY;  Surgeon: [name], MD;  Location: [facility] ENDOSCOPY;  Service: Endoscopy;  Laterality: N/A;  . LEFT HEART CATH AND CORONARY ANGIOGRAPHY N/A 11/08/2016   Procedure: Left Heart Cath and Coronary Angiography;  Surgeon: [name], MD;  Location: [facility] CV LAB;  Service: Cardiovascular;  Laterality: N/A;  . lumbar back surgery  2012  . [place] SURGERY  1991  . TONSILLECTOMY    . TONSILLECTOMY AND ADENOIDECTOMY  1960s    Family History  Problem Relation Age of Onset  . Stroke Mother   . Heart disease Mother   . Emphysema Father   . Ovarian cancer Sister   . Stroke Sister   . Other Child        died at birth    Allergies  Allergen Reactions  . Iodinated Diagnostic Agents Anaphylaxis and Other (See Comments)    IPD dye Info given by patient  . Ioxaglate  Anaphylaxis and Other (See Comments)    Info given by patient  . Milk-Related Compounds Anaphylaxis and Other (See Comments)    Lactose intolerance Can tolerate milk if its cooked into the recipe, just can't drink milk   . Red Dye Anaphylaxis  . Whey Other (See Comments)    Lactose intolerance  . Darvon [Propoxyphene] Rash  . Hydralazine Anxiety and Other (See Comments)    Facial flushing, pt prefers not to use it.     Current Outpatient Medications on File Prior to Visit  Medication Sig Dispense Refill  . acetaminophen (TYLENOL) 500 MG tablet Take 500-1,000 mg by  mouth daily as needed for mild pain or moderate pain.     [name] albuterol (PROVENTIL HFA;VENTOLIN HFA) 108 (90 Base) MCG/ACT inhaler Inhale 2 puffs into the lungs every 6 (six) hours as needed for wheezing or shortness of breath. 1 Inhaler 2  . aspirin EC 81 MG tablet Take 1 tablet (81 mg total) by mouth daily. 90 tablet 3  . carvedilol (COREG) 12.5 MG tablet Take 12.5 mg by mouth 2 (two) times daily with a meal.    . cloNIDine (CATAPRES-TTS-1) 0.1 mg/24hr patch Place 1 patch (0.1 mg total) onto the skin once a week. 4 patch 3  . cyanocobalamin (,VITAMIN B-12,) 1000 MCG/ML injection IM daily 11/29-12/3, then weekly x4, then monthly. (Patient taking differently: Inject 1,000 mcg into the muscle every 30 (thirty) days. ) 9 mL 0  . ezetimibe-simvastatin (VYTORIN) 10-20 MG tablet Take 1 tablet by mouth daily. 30 tablet 6  . ferrous sulfate 325 (65 FE) MG tablet Take 325 mg by mouth every evening.     . furosemide (LASIX) 40 MG tablet TAKE 1 TABLET BY MOUTH ONCE DAILY. 90 tablet 1  . gabapentin (NEURONTIN) 300 MG capsule TAKE 1 CAPSULE BY MOUTH THREE TIMES A DAY. (Patient taking differently: Take 300 mg by mouth daily as needed (for pain). PRESCRIBED 1 CAPSULE BY MOUTH THREE TIMES A DAY.) 270 capsule 1  . meclizine (ANTIVERT) 25 MG tablet Take 25 mg by mouth as needed for dizziness or nausea.     . pantoprazole (PROTONIX) 40 MG tablet TAKE  1 TABLET BY MOUTH TWICE DAILY. 180 tablet 2  . Pediatric Multivitamins-Iron (FLINTSTONES PLUS IRON) chewable tablet Chew 2 tablets by mouth daily. 60 tablet 0  . temazepam (RESTORIL) 15 MG capsule TAKE (1) CAPSULE BY MOUTH AT BEDTIME. 30 capsule 0  . warfarin (COUMADIN) 5 MG tablet TAKE 1/2 TABLET BY MOUTH ON WEDNESDAY AND 1 TABLET ALL OTHER DAYS OR AS DIRECTED BY ANTICOAGULATION CLINIC. (Patient taking differently: Take 2.5-5 mg by mouth See admin instructions. 1/2 tablet on all days except on Sundays Tuesdays and Thursdays. Take 1 tablet on these days.) 90 tablet 1   No current facility-administered medications on file prior to visit.     BP (!) 154/84   Temp 98.4 F (36.9 C) (Oral)   Wt 115 lb (52.2 kg)   BMI 21.03 kg/m       Objective:   Physical Exam  Constitutional: She is oriented to person, place, and time. She appears well-developed and well-nourished. No distress.  Cardiovascular: Normal rate, normal heart sounds and intact distal pulses. An irregularly irregular rhythm present.  Pulmonary/Chest: Effort normal and breath sounds normal.  Musculoskeletal: She exhibits tenderness.  Initial palpation along left lower back and left buttock.  No CVA tenderness noted  Neurological: She is alert and oriented to person, place, and time.  Skin: Skin is warm and dry. She is not diaphoretic.  Psychiatric: She has a normal mood and affect. Her behavior is normal. Judgment and thought content normal.  Nursing note and vitals reviewed.     Assessment & Plan:  1. Acute left-sided low back pain without sciatica -Appears as musculoskeletal.  Urinalysis not show acute bacterial infection.  Advised heating pad and Tylenol will follow-up next week when she returns for blood pressure check - POCT Urinalysis Dipstick (Automated)  2. Essential hypertension -She is adamant about coming off her clonidine patch despite her blood pressure not being very well controlled today.  Will increase Coreg  to 25 mg  twice daily, DC clonidine patch, and have her follow-up in 1 week - carvedilol (COREG) 25 MG tablet; Take 1 tablet (25 mg total) by mouth 2 (two) times daily with a meal.  Dispense: 60 tablet; Refill: 0  Dorothyann Peng, NP

## 2018-01-29 NOTE — Telephone Encounter (Signed)
Spoke to [place] and informed her to proceed with PT.  Also notified her that the pt is here in the office and we will conduct a urine.  No further action required.

## 2018-01-29 NOTE — Patient Instructions (Signed)
It was great seeing you today   I am going to have you stop the clonodine patch and increase Coreg from 12.5 mg to 25 mg twice a day ( you can take two pills of your current dose twice a day)   Follow up in one week   Use Tylenol and a heating pad for your back pain and we will see how you are feeling next week

## 2018-01-30 ENCOUNTER — Ambulatory Visit (INDEPENDENT_AMBULATORY_CARE_PROVIDER_SITE_OTHER): Payer: Medicare Other | Admitting: General Practice

## 2018-01-30 DIAGNOSIS — Z7901 Long term (current) use of anticoagulants: Secondary | ICD-10-CM | POA: Diagnosis not present

## 2018-01-30 DIAGNOSIS — R079 Chest pain, unspecified: Secondary | ICD-10-CM | POA: Diagnosis not present

## 2018-01-30 DIAGNOSIS — I951 Orthostatic hypotension: Secondary | ICD-10-CM | POA: Diagnosis not present

## 2018-01-30 DIAGNOSIS — I5032 Chronic diastolic (congestive) heart failure: Secondary | ICD-10-CM | POA: Diagnosis not present

## 2018-01-30 DIAGNOSIS — I2510 Atherosclerotic heart disease of native coronary artery without angina pectoris: Secondary | ICD-10-CM | POA: Diagnosis not present

## 2018-01-30 DIAGNOSIS — I4891 Unspecified atrial fibrillation: Secondary | ICD-10-CM

## 2018-01-30 DIAGNOSIS — I480 Paroxysmal atrial fibrillation: Secondary | ICD-10-CM | POA: Diagnosis not present

## 2018-01-30 DIAGNOSIS — I110 Hypertensive heart disease with heart failure: Secondary | ICD-10-CM | POA: Diagnosis not present

## 2018-01-30 LAB — POCT INR: INR: 2.8 (ref 2.0–3.0)

## 2018-01-30 NOTE — Patient Instructions (Signed)
Pre visit review using our clinic review tool, if applicable. No additional management support is needed unless otherwise documented below in the visit note.  Continue to take 1 tablet daily except for 1/2  tablet on Monday/Wed/Fridays/Saturdays.  Re-check in 2 weeks. Dosing instructions given to Christie Beckers, RN @ [facility] while in patient's home.

## 2018-01-30 NOTE — Progress Notes (Signed)
I have reviewed and agree with this plan  

## 2018-02-05 ENCOUNTER — Ambulatory Visit: Payer: Medicare Other | Admitting: Adult Health

## 2018-02-05 DIAGNOSIS — Z0289 Encounter for other administrative examinations: Secondary | ICD-10-CM

## 2018-02-05 NOTE — Progress Notes (Deleted)
Subjective:    Patient ID: [name], female    DOB: Mar 01, 1941, 77 y.o.   MRN: [id number]  HPI 77 year old female who  has a past medical history of Anemia (2005), Aortic stenosis, Arthritis, Broken back (2013), CAD (coronary artery disease), CHF (congestive heart failure) ([place]), Contrast media allergy, Diastolic heart failure ([place]), Esophageal cancer ([place]) (05/06/2015), GERD (gastroesophageal reflux disease), H/O iron deficiency, HH (hiatus hernia) (2008), Hypertension, Paroxysmal atrial fibrillation ([place]), PONV (postoperative nausea and vomiting), Pulmonary emboli ([place]) (2008, 2012), and Stroke [facility]) (1982). She presents to the office today for one-week follow-up regarding hypertension.  During her last visit she was adamant about coming off clonidine patch.  Due to her blood pressure not being well controlled, Coreg was increased to 25 mg daily and she was instructed to stop using the patch.  Today in the office she reports that   Review of Systems See HPI   Past Medical History:  Diagnosis Date  . Anemia 2005   Generally microcytic, transfusions in [address], 2012, 02/2015, 05/2015  . Aortic stenosis    Moderate November 2017  . Arthritis   . Broken back 2013   Chronic back pain.   [name] CAD (coronary artery disease)    Cardiac catheterization 2014 - 80% mid RCA and 70% OM managed medically  . CHF (congestive heart failure) ([place])   . Contrast media allergy   . Diastolic heart failure ([place])   . Esophageal cancer ([place]) 05/06/2015   Adenocarcinoma GE junction  . GERD (gastroesophageal reflux disease)   . H/O iron deficiency    05-06-15 iron infusion ([facility])  . HH (hiatus hernia) 2008   Large with associated erosions  . Hypertension   . Paroxysmal atrial fibrillation ([name])   . PONV (postoperative nausea and vomiting)   . Pulmonary emboli ([place]) 2008, 2012  . Stroke [facility]) 1982    Social History   Socioeconomic History  . Marital status: Divorced    Spouse name: Not on file  .  Number of children: 3  . Years of education: 31  . Highest education level: Not on file  Occupational History  . Occupation: Retired  Scientific laboratory technician  . Financial resource strain: Not on file  . Food insecurity:    Worry: Not on file    Inability: Not on file  . Transportation needs:    Medical: Not on file    Non-medical: Not on file  Tobacco Use  . Smoking status: Never Smoker  . Smokeless tobacco: Never Used  Substance and Sexual Activity  . Alcohol use: No    Alcohol/week: 0.0 oz  . Drug use: No  . Sexual activity: Not Currently  Lifestyle  . Physical activity:    Days per week: Not on file    Minutes per session: Not on file  . Stress: Not on file  Relationships  . Social connections:    Talks on phone: Not on file    Gets together: Not on file    Attends religious service: Not on file    Active member of club or organization: Not on file    Attends meetings of clubs or organizations: Not on file    Relationship status: Not on file  . Intimate partner violence:    Fear of current or ex partner: Not on file    Emotionally abused: Not on file    Physically abused: Not on file    Forced sexual activity: Not on file  Other Topics Concern  .  Not on file  Social History Narrative   Born and raised in [place], [place]. Currently resides in a house with her son. 1 dog. Fun: go to church   Divorced(Has total of #3 children)-[place], [place], [place]   Denies religious beliefs that would effect health care.    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[employer]    Past Surgical History:  Procedure Laterality Date  . APPENDECTOMY  1950s  . BACK SURGERY    . CARPAL TUNNEL RELEASE Bilateral 1990s  . [place] SURGERY  2014  . CHOLECYSTECTOMY  1980s   open  . COLONOSCOPY N/A 02/17/2015   Procedure: COLONOSCOPY;  Surgeon: [name], MD;  [facility];  Service: Endoscopy;  Laterality: N/A;  .  ESOPHAGOGASTRODUODENOSCOPY N/A 02/16/2015   Procedure: ESOPHAGOGASTRODUODENOSCOPY (EGD);  Surgeon: [name], MD;  Location: [facility];  Service: Endoscopy;  Laterality: N/A;  . ESOPHAGOGASTRODUODENOSCOPY N/A 05/06/2015   Procedure: ESOPHAGOGASTRODUODENOSCOPY (EGD);  Surgeon: [name], MD;  Location: [facility] ENDOSCOPY;  Service: Endoscopy;  Laterality: N/A;  . EUS N/A 05/20/2015   Procedure: UPPER ENDOSCOPIC ULTRASOUND (EUS) LINEAR;  Surgeon: [name], MD;  [facility] ENDOS[facility];  Service: Endoscopy;  Laterality: N/A;  . exploratory lab  1950s or 1960s  . GIVENS CAPSULE STUDY N/A 05/06/2015   Procedure: GIVENS CAPSULE STUDY;  Surgeon: [name], MD;  Location: [facility] ENDOSCOPY;  Service: Endoscopy;  Laterality: N/A;  . LEFT HEART CATH AND CORONARY ANGIOGRAPHY N/A 11/08/2016   Procedure: Left Heart Cath and Coronary Angiography;  Surgeon: [name], MD;  Location: [place] CV LAB;  Service: Cardiovascular;  Laterality: N/A;  . lumbar back surgery  2012  . [place] SURGERY  1991  . TONSILLECTOMY    . TONSILLECTOMY AND ADENOIDECTOMY  1960s    Family History  Problem Relation Age of Onset  . Stroke Mother   . Heart disease Mother   . Emphysema Father   . Ovarian cancer Sister   . Stroke Sister   . Other Child        died at birth    Allergies  Allergen Reactions  . Iodinated Diagnostic Agents Anaphylaxis and Other (See Comments)    IPD dye Info given by patient  . Ioxaglate Anaphylaxis and Other (See Comments)    Info given by patient  . Milk-Related Compounds Anaphylaxis and Other (See Comments)    Lactose intolerance Can tolerate milk if its cooked into the recipe, just can't drink milk   . Red Dye Anaphylaxis  . Whey Other (See Comments)    Lactose intolerance  . Darvon [Propoxyphene] Rash  . Hydralazine Anxiety and Other (See Comments)    Facial flushing, pt prefers not to use it.     Current Outpatient Medications on File Prior to Visit    Medication Sig Dispense Refill  . acetaminophen (TYLENOL) 500 MG tablet Take 500-1,000 mg by mouth daily as needed for mild pain or moderate pain.     [name] albuterol (PROVENTIL HFA;VENTOLIN HFA) 108 (90 Base) MCG/ACT inhaler Inhale 2 puffs into the lungs every 6 (six) hours as needed for wheezing or shortness of breath. 1 Inhaler 2  . aspirin EC 81 MG tablet Take 1 tablet (81 mg total) by mouth daily. 90 tablet 3  . carvedilol (COREG) 25 MG tablet Take 1 tablet (25 mg total) by mouth 2 (two) times daily with a meal. 60 tablet 0  .  cyanocobalamin (,VITAMIN B-12,) 1000 MCG/ML injection IM daily 11/29-12/3, then weekly x4, then monthly. (Patient taking differently: Inject 1,000 mcg into the muscle every 30 (thirty) days. ) 9 mL 0  . ezetimibe-simvastatin (VYTORIN) 10-20 MG tablet Take 1 tablet by mouth daily. 30 tablet 6  . ferrous sulfate 325 (65 FE) MG tablet Take 325 mg by mouth every evening.     . furosemide (LASIX) 40 MG tablet TAKE 1 TABLET BY MOUTH ONCE DAILY. 90 tablet 1  . gabapentin (NEURONTIN) 300 MG capsule TAKE 1 CAPSULE BY MOUTH THREE TIMES A DAY. (Patient taking differently: Take 300 mg by mouth daily as needed (for pain). PRESCRIBED 1 CAPSULE BY MOUTH THREE TIMES A DAY.) 270 capsule 1  . meclizine (ANTIVERT) 25 MG tablet Take 25 mg by mouth as needed for dizziness or nausea.     . pantoprazole (PROTONIX) 40 MG tablet TAKE 1 TABLET BY MOUTH TWICE DAILY. 180 tablet 2  . Pediatric Multivitamins-Iron (FLINTSTONES PLUS IRON) chewable tablet Chew 2 tablets by mouth daily. 60 tablet 0  . temazepam (RESTORIL) 15 MG capsule TAKE (1) CAPSULE BY MOUTH AT BEDTIME. 30 capsule 0  . warfarin (COUMADIN) 5 MG tablet TAKE 1/2 TABLET BY MOUTH ON WEDNESDAY AND 1 TABLET ALL OTHER DAYS OR AS DIRECTED BY ANTICOAGULATION CLINIC. (Patient taking differently: Take 2.5-5 mg by mouth See admin instructions. 1/2 tablet on all days except on Sundays Tuesdays and Thursdays. Take 1 tablet on these days.) 90 tablet 1    No current facility-administered medications on file prior to visit.     There were no vitals taken for this visit.      Objective:   Physical Exam  Constitutional: She is oriented to person, place, and time. She appears well-developed and well-nourished. No distress.  Cardiovascular: Normal rate, regular rhythm, normal heart sounds and intact distal pulses.  Pulmonary/Chest: Effort normal and breath sounds normal.  Neurological: She is alert and oriented to person, place, and time.  Skin: Skin is warm and dry. She is not diaphoretic.  Psychiatric: She has a normal mood and affect. Her behavior is normal. Judgment and thought content normal.  Nursing note and vitals reviewed.     Assessment & Plan:

## 2018-02-08 ENCOUNTER — Telehealth: Payer: Self-pay | Admitting: Adult Health

## 2018-02-08 ENCOUNTER — Ambulatory Visit (INDEPENDENT_AMBULATORY_CARE_PROVIDER_SITE_OTHER): Payer: Medicare Other | Admitting: General Practice

## 2018-02-08 DIAGNOSIS — Z7901 Long term (current) use of anticoagulants: Secondary | ICD-10-CM | POA: Diagnosis not present

## 2018-02-08 DIAGNOSIS — R079 Chest pain, unspecified: Secondary | ICD-10-CM | POA: Diagnosis not present

## 2018-02-08 DIAGNOSIS — I110 Hypertensive heart disease with heart failure: Secondary | ICD-10-CM | POA: Diagnosis not present

## 2018-02-08 DIAGNOSIS — I4891 Unspecified atrial fibrillation: Secondary | ICD-10-CM | POA: Diagnosis not present

## 2018-02-08 DIAGNOSIS — I480 Paroxysmal atrial fibrillation: Secondary | ICD-10-CM | POA: Diagnosis not present

## 2018-02-08 DIAGNOSIS — I2510 Atherosclerotic heart disease of native coronary artery without angina pectoris: Secondary | ICD-10-CM | POA: Diagnosis not present

## 2018-02-08 DIAGNOSIS — I951 Orthostatic hypotension: Secondary | ICD-10-CM | POA: Diagnosis not present

## 2018-02-08 DIAGNOSIS — I5032 Chronic diastolic (congestive) heart failure: Secondary | ICD-10-CM | POA: Diagnosis not present

## 2018-02-08 LAB — POCT INR: INR: 2.1 (ref 2.0–3.0)

## 2018-02-08 NOTE — Telephone Encounter (Signed)
Noted  

## 2018-02-08 NOTE — Telephone Encounter (Signed)
Sarah with [facility] calling to report that the pt's INR-2.1. Judson Roch can be called back at [phone]

## 2018-02-08 NOTE — Patient Instructions (Signed)
Pre visit review using our clinic review tool, if applicable. No additional management support is needed unless otherwise documented below in the visit note.  Continue to take 1 tablet daily except for 1/2  tablet on Monday/Wed/Fridays/Saturdays.  Re-check in 2 weeks. Dosing instructions given to Judson Roch, RN @ [facility] while in patient's home. [phone].

## 2018-02-09 ENCOUNTER — Other Ambulatory Visit: Payer: Self-pay | Admitting: Adult Health

## 2018-02-09 ENCOUNTER — Telehealth: Payer: Self-pay | Admitting: Family Medicine

## 2018-02-09 DIAGNOSIS — R079 Chest pain, unspecified: Secondary | ICD-10-CM | POA: Diagnosis not present

## 2018-02-09 DIAGNOSIS — I5032 Chronic diastolic (congestive) heart failure: Secondary | ICD-10-CM | POA: Diagnosis not present

## 2018-02-09 DIAGNOSIS — I480 Paroxysmal atrial fibrillation: Secondary | ICD-10-CM | POA: Diagnosis not present

## 2018-02-09 DIAGNOSIS — I2510 Atherosclerotic heart disease of native coronary artery without angina pectoris: Secondary | ICD-10-CM | POA: Diagnosis not present

## 2018-02-09 DIAGNOSIS — I951 Orthostatic hypotension: Secondary | ICD-10-CM | POA: Diagnosis not present

## 2018-02-09 DIAGNOSIS — I110 Hypertensive heart disease with heart failure: Secondary | ICD-10-CM | POA: Diagnosis not present

## 2018-02-09 NOTE — Telephone Encounter (Signed)
Team health: Advanced home care nurse: Spoke with RN who is evaluating patient for back pain and sob.  Has afib and chf.  Reports 4 pound weight gain. Nl vital signs; pulse ox 98% Lungs are clear.  No worsening edema.  anxious appearing and wanting pain medications.  I recommend repeating check tomorrow with weight check and lung exam and vitals.   If worsening, give one xtra dose of lasix tomorrow and f/u with pcp for further recs on Monday.   Increased c/o dyspnea could be related to pain. Pain meds per pcp. Supportive care

## 2018-02-09 NOTE — Telephone Encounter (Signed)
Pt called requesting refill; advised pt that medication could not be filled on the weekend.  Pt states she is having pain and takes this medication prn.  Advised pt that if she was experiencing pain and could not wait for prescription to be approved that she should be evaluated in the ER. Pt verbalized understanding.

## 2018-02-13 DIAGNOSIS — I480 Paroxysmal atrial fibrillation: Secondary | ICD-10-CM | POA: Diagnosis not present

## 2018-02-13 DIAGNOSIS — R079 Chest pain, unspecified: Secondary | ICD-10-CM | POA: Diagnosis not present

## 2018-02-13 DIAGNOSIS — I2510 Atherosclerotic heart disease of native coronary artery without angina pectoris: Secondary | ICD-10-CM | POA: Diagnosis not present

## 2018-02-13 DIAGNOSIS — I951 Orthostatic hypotension: Secondary | ICD-10-CM | POA: Diagnosis not present

## 2018-02-13 DIAGNOSIS — I5032 Chronic diastolic (congestive) heart failure: Secondary | ICD-10-CM | POA: Diagnosis not present

## 2018-02-13 DIAGNOSIS — I110 Hypertensive heart disease with heart failure: Secondary | ICD-10-CM | POA: Diagnosis not present

## 2018-02-15 ENCOUNTER — Emergency Department (HOSPITAL_COMMUNITY): Payer: Medicare Other

## 2018-02-15 ENCOUNTER — Other Ambulatory Visit: Payer: Self-pay

## 2018-02-15 ENCOUNTER — Encounter (HOSPITAL_COMMUNITY): Payer: Self-pay | Admitting: Emergency Medicine

## 2018-02-15 ENCOUNTER — Inpatient Hospital Stay (HOSPITAL_COMMUNITY)
Admission: EM | Admit: 2018-02-15 | Discharge: 2018-02-18 | DRG: 392 | Disposition: A | Discharge status: Home Health | Payer: Medicare Other | Attending: Internal Medicine | Admitting: Internal Medicine

## 2018-02-15 DIAGNOSIS — K219 Gastro-esophageal reflux disease without esophagitis: Secondary | ICD-10-CM | POA: Diagnosis not present

## 2018-02-15 DIAGNOSIS — Z91041 Radiographic dye allergy status: Secondary | ICD-10-CM

## 2018-02-15 DIAGNOSIS — M549 Dorsalgia, unspecified: Secondary | ICD-10-CM | POA: Diagnosis not present

## 2018-02-15 DIAGNOSIS — I110 Hypertensive heart disease with heart failure: Secondary | ICD-10-CM | POA: Diagnosis present

## 2018-02-15 DIAGNOSIS — Z7982 Long term (current) use of aspirin: Secondary | ICD-10-CM

## 2018-02-15 DIAGNOSIS — Z8673 Personal history of transient ischemic attack (TIA), and cerebral infarction without residual deficits: Secondary | ICD-10-CM

## 2018-02-15 DIAGNOSIS — I10 Essential (primary) hypertension: Secondary | ICD-10-CM | POA: Diagnosis present

## 2018-02-15 DIAGNOSIS — Z86711 Personal history of pulmonary embolism: Secondary | ICD-10-CM

## 2018-02-15 DIAGNOSIS — M1990 Unspecified osteoarthritis, unspecified site: Secondary | ICD-10-CM | POA: Diagnosis present

## 2018-02-15 DIAGNOSIS — Z8249 Family history of ischemic heart disease and other diseases of the circulatory system: Secondary | ICD-10-CM

## 2018-02-15 DIAGNOSIS — E739 Lactose intolerance, unspecified: Secondary | ICD-10-CM | POA: Diagnosis present

## 2018-02-15 DIAGNOSIS — R0602 Shortness of breath: Secondary | ICD-10-CM | POA: Diagnosis not present

## 2018-02-15 DIAGNOSIS — I350 Nonrheumatic aortic (valve) stenosis: Secondary | ICD-10-CM | POA: Diagnosis present

## 2018-02-15 DIAGNOSIS — K5732 Diverticulitis of large intestine without perforation or abscess without bleeding: Secondary | ICD-10-CM | POA: Diagnosis not present

## 2018-02-15 DIAGNOSIS — D509 Iron deficiency anemia, unspecified: Secondary | ICD-10-CM | POA: Diagnosis present

## 2018-02-15 DIAGNOSIS — Z8501 Personal history of malignant neoplasm of esophagus: Secondary | ICD-10-CM

## 2018-02-15 DIAGNOSIS — I2510 Atherosclerotic heart disease of native coronary artery without angina pectoris: Secondary | ICD-10-CM | POA: Diagnosis present

## 2018-02-15 DIAGNOSIS — J189 Pneumonia, unspecified organism: Secondary | ICD-10-CM | POA: Diagnosis not present

## 2018-02-15 DIAGNOSIS — R109 Unspecified abdominal pain: Secondary | ICD-10-CM | POA: Diagnosis not present

## 2018-02-15 DIAGNOSIS — Z91018 Allergy to other foods: Secondary | ICD-10-CM

## 2018-02-15 DIAGNOSIS — I480 Paroxysmal atrial fibrillation: Secondary | ICD-10-CM | POA: Diagnosis not present

## 2018-02-15 DIAGNOSIS — Z823 Family history of stroke: Secondary | ICD-10-CM

## 2018-02-15 DIAGNOSIS — R0789 Other chest pain: Secondary | ICD-10-CM | POA: Diagnosis not present

## 2018-02-15 DIAGNOSIS — R1013 Epigastric pain: Secondary | ICD-10-CM | POA: Diagnosis not present

## 2018-02-15 DIAGNOSIS — G8929 Other chronic pain: Secondary | ICD-10-CM | POA: Diagnosis present

## 2018-02-15 DIAGNOSIS — I5030 Unspecified diastolic (congestive) heart failure: Secondary | ICD-10-CM | POA: Diagnosis present

## 2018-02-15 DIAGNOSIS — R52 Pain, unspecified: Secondary | ICD-10-CM | POA: Diagnosis not present

## 2018-02-15 DIAGNOSIS — I5032 Chronic diastolic (congestive) heart failure: Secondary | ICD-10-CM | POA: Diagnosis not present

## 2018-02-15 DIAGNOSIS — J90 Pleural effusion, not elsewhere classified: Secondary | ICD-10-CM

## 2018-02-15 DIAGNOSIS — K529 Noninfective gastroenteritis and colitis, unspecified: Principal | ICD-10-CM | POA: Diagnosis present

## 2018-02-15 DIAGNOSIS — Z7901 Long term (current) use of anticoagulants: Secondary | ICD-10-CM

## 2018-02-15 DIAGNOSIS — Z79891 Long term (current) use of opiate analgesic: Secondary | ICD-10-CM

## 2018-02-15 DIAGNOSIS — R1084 Generalized abdominal pain: Secondary | ICD-10-CM

## 2018-02-15 DIAGNOSIS — J181 Lobar pneumonia, unspecified organism: Secondary | ICD-10-CM | POA: Diagnosis not present

## 2018-02-15 DIAGNOSIS — Z79899 Other long term (current) drug therapy: Secondary | ICD-10-CM

## 2018-02-15 DIAGNOSIS — E785 Hyperlipidemia, unspecified: Secondary | ICD-10-CM | POA: Diagnosis present

## 2018-02-15 DIAGNOSIS — C160 Malignant neoplasm of cardia: Secondary | ICD-10-CM | POA: Diagnosis present

## 2018-02-15 DIAGNOSIS — E782 Mixed hyperlipidemia: Secondary | ICD-10-CM | POA: Diagnosis present

## 2018-02-15 DIAGNOSIS — K559 Vascular disorder of intestine, unspecified: Secondary | ICD-10-CM | POA: Diagnosis not present

## 2018-02-15 DIAGNOSIS — Z87892 Personal history of anaphylaxis: Secondary | ICD-10-CM

## 2018-02-15 DIAGNOSIS — Z9049 Acquired absence of other specified parts of digestive tract: Secondary | ICD-10-CM

## 2018-02-15 DIAGNOSIS — Z9102 Food additives allergy status: Secondary | ICD-10-CM

## 2018-02-15 HISTORY — DX: Headache: R51

## 2018-02-15 HISTORY — DX: Unspecified disturbances of skin sensation: R20.9

## 2018-02-15 HISTORY — DX: Paresthesia of skin: R20.2

## 2018-02-15 HISTORY — DX: Headache, unspecified: R51.9

## 2018-02-15 HISTORY — DX: Other chronic pain: G89.29

## 2018-02-15 LAB — URINALYSIS, ROUTINE W REFLEX MICROSCOPIC
BACTERIA UA: NONE SEEN
Bilirubin Urine: NEGATIVE
Glucose, UA: NEGATIVE mg/dL
Ketones, ur: NEGATIVE mg/dL
Leukocytes, UA: NEGATIVE
Nitrite: NEGATIVE
PROTEIN: NEGATIVE mg/dL
Specific Gravity, Urine: 1.016 (ref 1.005–1.030)
pH: 6 (ref 5.0–8.0)

## 2018-02-15 LAB — BASIC METABOLIC PANEL
Anion gap: 6 (ref 5–15)
BUN: 9 mg/dL (ref 8–23)
CHLORIDE: 106 mmol/L (ref 98–111)
CO2: 27 mmol/L (ref 22–32)
Calcium: 8.6 mg/dL — ABNORMAL LOW (ref 8.9–10.3)
Creatinine, Ser: 0.83 mg/dL (ref 0.44–1.00)
GFR calc Af Amer: >60 mL/min (ref >60)
GFR calc non Af Amer: >60 mL/min (ref >60)
Glucose, Bld: 141 mg/dL — ABNORMAL HIGH (ref 70–99)
Potassium: 4.2 mmol/L (ref 3.5–5.1)
SODIUM: 139 mmol/L (ref 135–145)

## 2018-02-15 LAB — HEPATIC FUNCTION PANEL
ALBUMIN: 3.6 g/dL (ref 3.5–5.0)
ALT: 16 U/L (ref 0–44)
AST: 17 U/L (ref 15–41)
Alkaline Phosphatase: 79 U/L (ref 38–126)
Bilirubin, Direct: 0.2 mg/dL (ref 0.0–0.2)
Indirect Bilirubin: 0.7 mg/dL (ref 0.3–0.9)
TOTAL PROTEIN: 6.5 g/dL (ref 6.5–8.1)
Total Bilirubin: 0.9 mg/dL (ref 0.3–1.2)

## 2018-02-15 LAB — CBC WITH DIFFERENTIAL/PLATELET
Basophils Absolute: 0 10*3/uL (ref 0.0–0.1)
Basophils Relative: 0 %
Eosinophils Absolute: 0.1 10*3/uL (ref 0.0–0.7)
Eosinophils Relative: 3 %
HEMATOCRIT: 37.6 % (ref 36.0–46.0)
Hemoglobin: 12.5 g/dL (ref 12.0–15.0)
LYMPHS ABS: 1.4 10*3/uL (ref 0.7–4.0)
Lymphocytes Relative: 27 %
MCH: 29.1 pg (ref 26.0–34.0)
MCHC: 33.2 g/dL (ref 30.0–36.0)
MCV: 87.4 fL (ref 78.0–100.0)
MONO ABS: 0.5 10*3/uL (ref 0.1–1.0)
MONOS PCT: 9 %
NEUTROS ABS: 3.2 10*3/uL (ref 1.7–7.7)
Neutrophils Relative %: 61 %
Platelets: 158 10*3/uL (ref 150–400)
RBC: 4.3 MIL/uL (ref 3.87–5.11)
RDW: 12.9 % (ref 11.5–15.5)
WBC: 5.3 10*3/uL (ref 4.0–10.5)

## 2018-02-15 LAB — TROPONIN I

## 2018-02-15 LAB — PROTIME-INR
INR: 1.92
Prothrombin Time: 21.8 seconds — ABNORMAL HIGH (ref 11.4–15.2)

## 2018-02-15 LAB — LIPASE, BLOOD: Lipase: 22 U/L (ref 11–51)

## 2018-02-15 LAB — BRAIN NATRIURETIC PEPTIDE: B Natriuretic Peptide: 531 pg/mL — ABNORMAL HIGH (ref 0.0–100.0)

## 2018-02-15 MED ORDER — FERROUS SULFATE 325 (65 FE) MG PO TABS: 325.0000 mg | ORAL_TABLET | Freq: Every evening | ORAL | Status: DC

## 2018-02-15 MED ORDER — GABAPENTIN 300 MG PO CAPS
300.0000 mg | ORAL_CAPSULE | Freq: Every day | ORAL | Status: DC | PRN
  Administered 2018-02-17: 300 mg via ORAL
  Filled 2018-02-15: qty 1

## 2018-02-15 MED ORDER — CARVEDILOL 12.5 MG PO TABS
25.0000 mg | ORAL_TABLET | Freq: Two times a day (BID) | ORAL | Status: DC
  Administered 2018-02-16 – 2018-02-18 (×5): 25 mg via ORAL
  Filled 2018-02-15 (×5): qty 2

## 2018-02-15 MED ORDER — TEMAZEPAM 15 MG PO CAPS
15.0000 mg | ORAL_CAPSULE | Freq: Every day | ORAL | Status: DC
  Administered 2018-02-15 – 2018-02-17 (×3): 15 mg via ORAL
  Filled 2018-02-15 (×3): qty 1

## 2018-02-15 MED ORDER — HYDRALAZINE HCL 20 MG/ML IJ SOLN
20.0000 mg | Freq: Once | INTRAMUSCULAR | Status: AC
  Administered 2018-02-15: 20 mg via INTRAVENOUS
  Filled 2018-02-15: qty 1

## 2018-02-15 MED ORDER — ALBUTEROL SULFATE (2.5 MG/3ML) 0.083% IN NEBU: 3.0000 mL | INHALATION_SOLUTION | Freq: Four times a day (QID) | RESPIRATORY_TRACT | Status: DC | PRN

## 2018-02-15 MED ORDER — WARFARIN SODIUM 2.5 MG PO TABS
2.5000 mg | ORAL_TABLET | Freq: Once | ORAL | Status: AC
  Administered 2018-02-15: 2.5 mg via ORAL
  Filled 2018-02-15: qty 1

## 2018-02-15 MED ORDER — FAMOTIDINE 20 MG PO TABS
20.0000 mg | ORAL_TABLET | Freq: Two times a day (BID) | ORAL | Status: DC
  Administered 2018-02-15 – 2018-02-18 (×6): 20 mg via ORAL
  Filled 2018-02-15 (×6): qty 1

## 2018-02-15 MED ORDER — SODIUM CHLORIDE 0.9 % IV SOLN
500.0000 mg | INTRAVENOUS | Status: DC
  Filled 2018-02-15 (×2): qty 500

## 2018-02-15 MED ORDER — EZETIMIBE-SIMVASTATIN 10-20 MG PO TABS: 1.0000 | ORAL_TABLET | Freq: Every day | ORAL | Status: DC

## 2018-02-15 MED ORDER — ONDANSETRON HCL 4 MG/2ML IJ SOLN
4.0000 mg | Freq: Four times a day (QID) | INTRAMUSCULAR | Status: DC | PRN
  Administered 2018-02-15 – 2018-02-18 (×3): 4 mg via INTRAVENOUS
  Filled 2018-02-15 (×3): qty 2

## 2018-02-15 MED ORDER — FUROSEMIDE 40 MG PO TABS
40.0000 mg | ORAL_TABLET | Freq: Two times a day (BID) | ORAL | Status: DC
  Administered 2018-02-16: 40 mg via ORAL
  Filled 2018-02-15: qty 1

## 2018-02-15 MED ORDER — EZETIMIBE 10 MG PO TABS
10.0000 mg | ORAL_TABLET | Freq: Every day | ORAL | Status: DC
  Administered 2018-02-16 – 2018-02-17 (×2): 10 mg via ORAL
  Filled 2018-02-15 (×2): qty 1

## 2018-02-15 MED ORDER — MECLIZINE HCL 12.5 MG PO TABS
25.0000 mg | ORAL_TABLET | Freq: Four times a day (QID) | ORAL | Status: DC | PRN
Start: 2018-02-15 — End: 2018-02-18

## 2018-02-15 MED ORDER — WARFARIN - PHARMACIST DOSING INPATIENT
Freq: Every day | Status: DC
  Administered 2018-02-16 – 2018-02-17 (×2)

## 2018-02-15 MED ORDER — WARFARIN SODIUM 2.5 MG PO TABS: 2.5000 mg | ORAL_TABLET | ORAL | Status: DC

## 2018-02-15 MED ORDER — PANTOPRAZOLE SODIUM 40 MG PO TBEC
40.0000 mg | DELAYED_RELEASE_TABLET | Freq: Two times a day (BID) | ORAL | Status: DC
  Administered 2018-02-15 – 2018-02-18 (×6): 40 mg via ORAL
  Filled 2018-02-15 (×6): qty 1

## 2018-02-15 MED ORDER — KETOROLAC TROMETHAMINE 15 MG/ML IJ SOLN
15.0000 mg | Freq: Four times a day (QID) | INTRAMUSCULAR | Status: DC | PRN
  Administered 2018-02-15 – 2018-02-18 (×6): 15 mg via INTRAVENOUS
  Filled 2018-02-15 (×6): qty 1

## 2018-02-15 MED ORDER — CEFTRIAXONE SODIUM 1 G IJ SOLR
1.0000 g | INTRAMUSCULAR | Status: DC
  Administered 2018-02-16 – 2018-02-17 (×2): 1 g via INTRAVENOUS
  Filled 2018-02-15: qty 1
  Filled 2018-02-15: qty 10
  Filled 2018-02-15: qty 1

## 2018-02-15 MED ORDER — TRAMADOL HCL 50 MG PO TABS
50.0000 mg | ORAL_TABLET | Freq: Four times a day (QID) | ORAL | Status: DC | PRN
  Administered 2018-02-15 – 2018-02-16 (×2): 50 mg via ORAL
  Filled 2018-02-15 (×2): qty 1

## 2018-02-15 MED ORDER — FENTANYL CITRATE (PF) 100 MCG/2ML IJ SOLN
50.0000 ug | Freq: Once | INTRAMUSCULAR | Status: AC
Start: 2018-02-15 — End: 2018-02-15
  Administered 2018-02-15: 50 ug via INTRAVENOUS
  Filled 2018-02-15: qty 2

## 2018-02-15 MED ORDER — SODIUM CHLORIDE 0.9 % IV SOLN
500.0000 mg | Freq: Once | INTRAVENOUS | Status: AC
  Administered 2018-02-15: 500 mg via INTRAVENOUS
  Filled 2018-02-15: qty 500

## 2018-02-15 MED ORDER — ASPIRIN EC 81 MG PO TBEC
81.0000 mg | DELAYED_RELEASE_TABLET | Freq: Every day | ORAL | Status: DC
  Administered 2018-02-16 – 2018-02-18 (×3): 81 mg via ORAL
  Filled 2018-02-15 (×3): qty 1

## 2018-02-15 MED ORDER — FUROSEMIDE 40 MG PO TABS: 40.0000 mg | ORAL_TABLET | Freq: Every day | ORAL | Status: DC

## 2018-02-15 MED ORDER — SIMVASTATIN 20 MG PO TABS
20.0000 mg | ORAL_TABLET | Freq: Every day | ORAL | Status: DC
  Administered 2018-02-16 – 2018-02-17 (×2): 20 mg via ORAL
  Filled 2018-02-15 (×2): qty 1

## 2018-02-15 MED ORDER — SODIUM CHLORIDE 0.9 % IV SOLN
1.0000 g | Freq: Once | INTRAVENOUS | Status: AC
  Administered 2018-02-15: 1 g via INTRAVENOUS
  Filled 2018-02-15: qty 10

## 2018-02-15 NOTE — ED Triage Notes (Signed)
Patient complaining of high blood pressure today upon awakening. States it was 199/98. Also complaining of pain to bilateral shoulders, arms, and abdomen x 2 days.

## 2018-02-15 NOTE — ED Provider Notes (Signed)
[facility] EMERGENCY DEPARTMENT Provider Note   CSN: [id number] Arrival date & time: 02/15/18  1432     History   Chief Complaint Chief Complaint  Patient presents with  . Hypertension    HPI [name] is a 77 y.o. female.  HPI Patient presents with pain everywhere.  States it is in both arms legs abdomen and chest.  Spent having it since yesterday.  States it is severe.  Mild relief with Ultram.  Pain is reportedly severe.  She ate 1 scrambled egg for lunch today.  States it did not change the pain.  History of A. fib.  States she has been in it for a while now.  States she can feel when she is in it.  No difficulty breathing.  No headache.  No confusion.  No dysuria.  She states she has been coughing.  No real sputum production. Past Medical History:  Diagnosis Date  . Anemia 2005   Generally microcytic, transfusions in [address], 2012, 02/2015, 05/2015  . Aortic stenosis    Moderate November 2017  . Arthritis   . Broken back 2013   Chronic back pain.   [name] CAD (coronary artery disease)    Cardiac catheterization 2014 - 80% mid RCA and 70% OM managed medically  . CHF (congestive heart failure) ([place])   . Chronic headache   . Contrast media allergy   . Diastolic heart failure ([place])   . Esophageal cancer ([place]) 05/06/2015   Adenocarcinoma GE junction  . Facial paresthesia   . GERD (gastroesophageal reflux disease)   . H/O iron deficiency    05-06-15 iron infusion ([facility])  . HH (hiatus hernia) 2008   Large with associated erosions  . Hypertension   . Paroxysmal atrial fibrillation ([name])   . PONV (postoperative nausea and vomiting)   . Pulmonary emboli ([place]) 2008, 2012  . Stroke [facility]) 1982    Patient Active Problem List   Diagnosis Date Noted  . Orthostatic hypotension 12/21/2017  . Peripheral neuropathy 12/21/2017  . CAD (coronary artery disease) 12/19/2017  . Long term (current) use of anticoagulants 03/14/2017  . Numbness of face 01/09/2017  . Unstable angina  ([place]) 11/02/2016  . Other fatigue 11/02/2016  . B12 deficiency 10/16/2016  . Preoperative cardiovascular examination 09/21/2016  . Hyperglycemia 06/26/2016  . Atrial fibrillation with RVR ([place]) 06/23/2016  . Encounter for medication review and counseling 06/07/2016  . Essential hypertension, benign 04/19/2016  . Aortic valve stenosis 09/28/2015  . GE junction carcinoma ([place]) 05/14/2015  . [place] lesion, acute   . IDA (iron deficiency anemia)   . Mixed hyperlipidemia 05/05/2015  . GERD (gastroesophageal reflux disease) 05/05/2015  . Absolute anemia   . Anticoagulated on Coumadin   . Osteoarthritis 04/16/2015  . History of pulmonary embolism 03/02/2015  . Shortness of breath 02/22/2015  . Benign neoplasm of descending colon 02/17/2015  . Diverticulosis of large intestine without diverticulitis 02/17/2015  . Esophageal stricture 02/16/2015  . UGI bleed 02/13/2015  . Supratherapeutic INR 02/13/2015  . Thoracic back pain 10/15/2014  . Macular degeneration 09/01/2014  . Benign paroxysmal positional vertigo   . Uncontrolled hypertension 08/24/2014  . Complaints of total body pain 08/03/2014  . Sleep disturbance 08/03/2014  . PAF (paroxysmal atrial fibrillation) ([place]) 01/21/2014  . Long term current use of anticoagulant therapy 01/21/2014  . Chronic diastolic congestive heart failure ([place]) 01/21/2014  . Chest pain 01/21/2014    Past Surgical History:  Procedure Laterality Date  . APPENDECTOMY  1950s  .  BACK SURGERY    . CARPAL TUNNEL RELEASE Bilateral 1990s  . [place] SURGERY  2014  . CHOLECYSTECTOMY  1980s   open  . COLONOSCOPY N/A 02/17/2015   Procedure: COLONOSCOPY;  Surgeon: [name], MD;  Location: [facility] ENDOSCOPY;  Service: Endoscopy;  Laterality: N/A;  . ESOPHAGOGASTRODUODENOSCOPY N/A 02/16/2015   Procedure: ESOPHAGOGASTRODUODENOSCOPY (EGD);  Surgeon: [name], MD;  Location: [facility] END[facility];  Service: Endoscopy;  Laterality: N/A;  .  ESOPHAGOGASTRODUODENOSCOPY N/A 05/06/2015   Procedure: ESOPHAGOGASTRODUODENOSCOPY (EGD);  Surgeon: [name], MD;  Location: [facility] ENDOSCOPY;  Service: Endoscopy;  Laterality: N/A;  . EUS N/A 05/20/2015   Procedure: UPPER ENDOSCOPIC ULTRASOUND (EUS) LINEAR;  Surgeon: [name], MD;  Location: [facility] ENDOSCOPY;  Service: Endoscopy;  Laterality: N/A;  . exploratory lab  1950s or 1960s  . GIVENS CAPSULE STUDY N/A 05/06/2015   Procedure: GIVENS CAPSULE STUDY;  Surgeon: [name], MD;  Location: [facility] ENDOSCOPY;  Service: Endoscopy;  Laterality: N/A;  . LEFT HEART CATH AND CORONARY ANGIOGRAPHY N/A 11/08/2016   Procedure: Left Heart Cath and Coronary Angiography;  Surgeon: [name], MD;  Location: [facility] CV LAB;  Service: Cardiovascular;  Laterality: N/A;  . lumbar back surgery  2012  . [place] SURGERY  1991  . TONSILLECTOMY    . TONSILLECTOMY AND ADENOIDECTOMY  1960s     OB History   None      Home Medications    Prior to Admission medications   Medication Sig Start Date End Date Taking? Authorizing Provider  acetaminophen (TYLENOL) 500 MG tablet Take 500-1,000 mg by mouth daily as needed for mild pain or moderate pain.     [provider]  albuterol (PROVENTIL HFA;VENTOLIN HFA) 108 (90 Base) MCG/ACT inhaler Inhale 2 puffs into the lungs every 6 (six) hours as needed for wheezing or shortness of breath. 09/05/17   Nafziger, Tommi Rumps, NP  aspirin EC 81 MG tablet Take 1 tablet (81 mg total) by mouth daily. 11/02/16   Hilty, Nadean Corwin, MD  carvedilol (COREG) 25 MG tablet Take 1 tablet (25 mg total) by mouth 2 (two) times daily with a meal. 01/29/18 02/28/18  Nafziger, Tommi Rumps, NP  cyanocobalamin (,VITAMIN B-12,) 1000 MCG/ML injection IM daily 11/29-12/3, then weekly x4, then monthly. Patient taking differently: Inject 1,000 mcg into the muscle every 30 (thirty) days.  05/30/16   Samuella Cota, MD  ezetimibe-simvastatin (VYTORIN) 10-20 MG tablet Take 1 tablet by mouth daily.  09/10/17   Hilty, Nadean Corwin, MD  ferrous sulfate 325 (65 FE) MG tablet Take 325 mg by mouth every evening.     [provider]  furosemide (LASIX) 40 MG tablet TAKE 1 TABLET BY MOUTH ONCE DAILY. 11/13/17   Nafziger, Tommi Rumps, NP  gabapentin (NEURONTIN) 300 MG capsule TAKE 1 CAPSULE BY MOUTH THREE TIMES A DAY. Patient taking differently: Take 300 mg by mouth daily as needed (for pain). PRESCRIBED 1 CAPSULE BY MOUTH THREE TIMES A DAY. 09/05/17   Nafziger, Tommi Rumps, NP  meclizine (ANTIVERT) 25 MG tablet Take 25 mg by mouth as needed for dizziness or nausea.  08/28/14   [provider]  pantoprazole (PROTONIX) 40 MG tablet TAKE 1 TABLET BY MOUTH TWICE DAILY. 12/05/17   Dorothyann Peng, NP  Pediatric Multivitamins-Iron (FLINTSTONES PLUS IRON) chewable tablet Chew 2 tablets by mouth daily. 05/30/16   Samuella Cota, MD  temazepam (RESTORIL) 15 MG capsule TAKE (1) CAPSULE BY MOUTH AT BEDTIME. 12/12/17   Dorothyann Peng, NP  traMADol (ULTRAM) 50 MG tablet TAKE (1) TABLET BY MOUTH EVERY EIGHT HOURS. 02/12/18   Nafziger, Tommi Rumps, NP  warfarin (COUMADIN) 5 MG tablet TAKE 1/2 TABLET BY MOUTH ON WEDNESDAY AND 1 TABLET ALL OTHER DAYS OR AS DIRECTED BY ANTICOAGULATION CLINIC. Patient taking differently: Take 2.5-5 mg by mouth See admin instructions. 1/2 tablet on all days except on Sundays Tuesdays and Thursdays. Take 1 tablet on these days. 12/07/17   Dorothyann Peng, NP    Family History Family History  Problem Relation Age of Onset  . Stroke Mother   . Heart disease Mother   . Emphysema Father   . Ovarian cancer Sister   . Stroke Sister   . Other Child        died at birth    Social History Social History   Tobacco Use  . Smoking status: Never Smoker  . Smokeless tobacco: Never Used  Substance Use Topics  . Alcohol use: No    Alcohol/week: 0.0 standard drinks  . Drug use: No     Allergies   Iodinated diagnostic agents; Ioxaglate; Milk-related compounds; Red dye; Whey; Darvon [propoxyphene];  and Hydralazine   Review of Systems Review of Systems  Constitutional: Negative for appetite change.  HENT: Negative for congestion.   Respiratory: Positive for cough.   Cardiovascular: Positive for chest pain.  Gastrointestinal: Positive for abdominal pain.  Genitourinary: Negative for flank pain.  Musculoskeletal: Positive for back pain.  Skin: Negative for wound.  Neurological: Negative for seizures.  Hematological: Negative for adenopathy.  Psychiatric/Behavioral: Negative for confusion.     Physical Exam Updated Vital Signs BP 102/81   Pulse 96   Temp 98 F (36.7 C) (Oral)   Resp 20   Ht 5\' 2"  (1.575 m)   Wt 54 kg   SpO2 95%   BMI 21.77 kg/m   Physical Exam  Constitutional: She appears well-developed.  HENT:  Head: Normocephalic.  Neck: Neck supple.  Cardiovascular: Normal rate.  Pulmonary/Chest: Effort normal.  Abdominal: There is tenderness.  Epigastric tenderness without rebound or guarding.  Musculoskeletal: She exhibits no edema.  Neurological: She is alert.  Skin: Skin is warm. Capillary refill takes less than 2 seconds.  Psychiatric: She has a normal mood and affect.     ED Treatments / Results  Labs (all labs ordered are listed, but only abnormal results are displayed) Labs Reviewed  BASIC METABOLIC PANEL - Abnormal; Notable for the following components:      Result Value   Glucose, Bld 141 (*)    Calcium 8.6 (*)    All other components within normal limits  URINALYSIS, ROUTINE W REFLEX MICROSCOPIC - Abnormal; Notable for the following components:   Hgb urine dipstick MODERATE (*)    All other components within normal limits  PROTIME-INR - Abnormal; Notable for the following components:   Prothrombin Time 21.8 (*)    All other components within normal limits  CBC WITH DIFFERENTIAL/PLATELET  HEPATIC FUNCTION PANEL  LIPASE, BLOOD  TROPONIN I    EKG EKG Interpretation  Date/Time:  Friday February 15 2018 16:34:00 EDT Ventricular Rate:    101 PR Interval:    QRS Duration: 74 QT Interval:  336 QTC Calculation: 436 R Axis:   74 Text Interpretation:  Atrial fibrillation Confirmed by Davonna Belling [phone]) on 02/15/2018 4:55:06 PM   Radiology Dg Chest 2 View  Result Date: 02/15/2018 CLINICAL DATA:  Hypertension EXAM: CHEST - 2 VIEW COMPARISON:  December 19, 2017 FINDINGS: There is airspace opacity in  the lateral right base with small right pleural effusion. There is also a small left pleural effusion. There is consolidation in the medial right base. Lungs elsewhere are clear. Heart is upper normal in size with mild pulmonary venous hypertension. No adenopathy. There is aortic atherosclerosis. There is postoperative change in the lower cervical region. There is degenerative change in the thoracic spine. Bones are osteoporotic. IMPRESSION: Consolidation in the medial and lateral aspects of the right base, felt to represent pneumonia. Small pleural effusions bilaterally. There is a degree of pulmonary vascular congestion. There is aortic atherosclerosis. Bones are osteoporotic. Aortic Atherosclerosis (ICD10-I70.0). Followup PA and lateral chest radiographs recommended in 3-4 weeks following trial of antibiotic therapy to ensure resolution and exclude underlying malignancy. Electronically Signed   By: Lowella Grip III M.D.   On: 02/15/2018 16:08    Procedures Procedures (including critical care time)  Medications Ordered in ED Medications  fentaNYL (SUBLIMAZE) injection 50 mcg (has no administration in time range)  cefTRIAXone (ROCEPHIN) 1 g in sodium chloride 0.9 % 100 mL IVPB (has no administration in time range)  azithromycin (ZITHROMAX) 500 mg in sodium chloride 0.9 % 250 mL IVPB (has no administration in time range)     Initial Impression / Assessment and Plan / ED Course  I have reviewed the triage vital signs and the nursing notes.  Pertinent labs & imaging results that were available during my care of the patient were  reviewed by me and considered in my medical decision making (see chart for details).     Patient with multiple complaints.  Chest pain abdominal pain arm pain back pain.  States she feels bad all over.  X-ray shows possible pneumonia.  Has had a little bit of a cough.  EKG reassuring.  Appears somewhat anxious.  With continued shortness of breath and chest pain will admit to hospitalist.  Discussed with Dr. Nehemiah Settle.  Patient is not hypoxic.  Final Clinical Impressions(s) / ED Diagnoses   Final diagnoses:  Community acquired pneumonia, unspecified laterality    ED Discharge Orders    None       Davonna Belling, MD 02/15/18 1710

## 2018-02-15 NOTE — ED Notes (Signed)
Pt complains of midsternal CP repeat EKG  Dr Alvino Chapel asked to reassess pt   Pt points to midchest as pain site  [place] and neighbor at bedside

## 2018-02-15 NOTE — ED Notes (Signed)
Dr Stinson in to assess 

## 2018-02-15 NOTE — ED Notes (Signed)
Dr Mamie Nick in to reassess

## 2018-02-15 NOTE — ED Notes (Signed)
Report to Morgan, RN 

## 2018-02-15 NOTE — H&P (Addendum)
History and Physical  [name]:[id number] DOB: [birth year] DOA: 02/15/2018  Referring physician: Dr Alvino Chapel, ED physician PCP: Dorothyann Peng, NP  Outpatient Specialists:   Patient Coming From: home  Chief Complaint: body pain, SOB  HPI: [name] is a 77 y.o. female with a history of aortic stenosis, CAD, diastolic CHF grade 1, hiatal hernia, stroke, history of PE x2 on Coumadin, paroxysmal atrial fibrillation.  Patient has noted being in atrial fibrillation more over the past month.  Seems fairly constant.  Over the past week, patient has had increasing pains in her arms bilaterally, epigastric area, and chest.  Pain is severe and mild improvement with Ultram.  Not improved with food or liquid.  Not able to state what makes her pain worse.  She also complains of shortness of breath over the past week that is worse with lying down and improved with sitting up.  She has had a little bit of coughing but no sputum production.  She denies fevers, chills.  She has been nauseated quite a bit.  Emergency Department Course: Chest x-ray shows right middle lobe and right lower lobe pneumonia.  White count normal.  Review of Systems:   Pt denies any fevers, chills, nausea, vomiting, diarrhea, constipation, abdominal pain, dyspnea on exertion, wheezing, palpitations, headache, vision changes, lightheadedness, dizziness, melena, rectal bleeding.  Review of systems are otherwise negative  Past Medical History:  Diagnosis Date  . Anemia 2005   Generally microcytic, transfusions in [address], 2012, 02/2015, 05/2015  . Aortic stenosis    Moderate November 2017  . Arthritis   . Broken back 2013   Chronic back pain.   [name] CAD (coronary artery disease)    Cardiac catheterization 2014 - 80% mid RCA and 70% OM managed medically  . CHF (congestive heart failure) ([place])   . Chronic headache   . Contrast media allergy   . Diastolic heart failure ([place])   . Esophageal cancer ([place]) 05/06/2015   Adenocarcinoma GE junction  . Facial paresthesia   . GERD (gastroesophageal reflux disease)   . H/O iron deficiency    05-06-15 iron infusion ([facility])  . HH (hiatus hernia) 2008   Large with associated erosions  . Hypertension   . Paroxysmal atrial fibrillation ([name])   . PONV (postoperative nausea and vomiting)   . Pulmonary emboli ([place]) 2008, 2012  . Stroke [facility]) 1982   Past Surgical History:  Procedure Laterality Date  . APPENDECTOMY  1950s  . BACK SURGERY    . CARPAL TUNNEL RELEASE Bilateral 1990s  . [place] SURGERY  2014  . CHOLECYSTECTOMY  1980s   open  . COLONOSCOPY N/A 02/17/2015   Procedure: COLONOSCOPY;  Surgeon: [name], MD;  Location: [facility] ENDOSCOPY;  Service: Endoscopy;  Laterality: N/A;  . ESOPHAGOGASTRODUODENOSCOPY N/A 02/16/2015   Procedure: ESOPHAGOGASTRODUODENOSCOPY (EGD);  Surgeon: [name], MD;  Location: [facility] ENDOSCOPY;  Service: Endoscopy;  Laterality: N/A;  . ESOPHAGOGASTRODUODENOSCOPY N/A 05/06/2015   Procedure: ESOPHAGOGASTRODUODENOSCOPY (EGD);  Surgeon: [name], MD;  Location: [facility] ENDOSCOPY;  Service: Endoscopy;  Laterality: N/A;  . EUS N/A 05/20/2015   Procedure: UPPER ENDOSCOPIC ULTRASOUND (EUS) LINEAR;  Surgeon: [name], MD;  Location: [facility] ENDOSCOPY;  Service: Endoscopy;  Laterality: N/A;  . exploratory lab  1950s or 1960s  . GIVENS CAPSULE STUDY N/A 05/06/2015   Procedure: GIVENS CAPSULE STUDY;  Surgeon: [name], MD;  Location: [facility] ENDOSCOPY;  Service: Endoscopy;  Laterality: N/A;  . LEFT HEART CATH AND  CORONARY ANGIOGRAPHY N/A 11/08/2016   Procedure: Left Heart Cath and Coronary Angiography;  Surgeon: [name], MD;  Location: [facility] CV LAB;  Service: Cardiovascular;  Laterality: N/A;  . lumbar back surgery  2012  . [place] SURGERY  1991  . TONSILLECTOMY    . TONSILLECTOMY AND ADENOIDECTOMY  1960s   Social History:  reports that she has never smoked. She has never used smokeless tobacco. She reports that she  does not drink alcohol or use drugs. Patient lives at home  Allergies  Allergen Reactions  . Iodinated Diagnostic Agents Anaphylaxis and Other (See Comments)    IPD dye Info given by patient  . Ioxaglate Anaphylaxis and Other (See Comments)    Info given by patient  . Milk-Related Compounds Anaphylaxis and Other (See Comments)    Lactose intolerance Can tolerate milk if its cooked into the recipe, just can't drink milk   . Red Dye Anaphylaxis  . Whey Other (See Comments)    Lactose intolerance  . Darvon [Propoxyphene] Rash  . Hydralazine Anxiety and Other (See Comments)    Facial flushing, pt prefers not to use it.     Family History  Problem Relation Age of Onset  . Stroke Mother   . Heart disease Mother   . Emphysema Father   . Ovarian cancer Sister   . Stroke Sister   . Other Child        died at birth      Prior to Admission medications   Medication Sig Start Date End Date Taking? Authorizing Provider  carvedilol (COREG) 25 MG tablet Take 1 tablet (25 mg total) by mouth 2 (two) times daily with a meal. 01/29/18 02/28/18 Yes Nafziger, Tommi Rumps, NP  cloNIDine (CATAPRES - DOSED IN MG/24 HR) 0.1 mg/24hr patch Place 0.1 mg onto the skin once a week.   Yes [provider]  ezetimibe-simvastatin (VYTORIN) 10-20 MG tablet Take 1 tablet by mouth daily. 09/10/17  Yes Hilty, Nadean Corwin, MD  furosemide (LASIX) 40 MG tablet TAKE 1 TABLET BY MOUTH ONCE DAILY. 11/13/17  Yes Nafziger, Tommi Rumps, NP  pantoprazole (PROTONIX) 40 MG tablet TAKE 1 TABLET BY MOUTH TWICE DAILY. 12/05/17  Yes Nafziger, Tommi Rumps, NP  temazepam (RESTORIL) 15 MG capsule TAKE (1) CAPSULE BY MOUTH AT BEDTIME. 12/12/17  Yes Nafziger, Tommi Rumps, NP  traMADol (ULTRAM) 50 MG tablet TAKE (1) TABLET BY MOUTH EVERY EIGHT HOURS. 02/12/18  Yes Nafziger, Tommi Rumps, NP  acetaminophen (TYLENOL) 500 MG tablet Take 500-1,000 mg by mouth daily as needed for mild pain or moderate pain.     [provider]  albuterol (PROVENTIL HFA;VENTOLIN HFA)  108 (90 Base) MCG/ACT inhaler Inhale 2 puffs into the lungs every 6 (six) hours as needed for wheezing or shortness of breath. 09/05/17   Nafziger, Tommi Rumps, NP  aspirin EC 81 MG tablet Take 1 tablet (81 mg total) by mouth daily. 11/02/16   Hilty, Nadean Corwin, MD  cyanocobalamin (,VITAMIN B-12,) 1000 MCG/ML injection IM daily 11/29-12/3, then weekly x4, then monthly. Patient taking differently: Inject 1,000 mcg into the muscle every 30 (thirty) days.  05/30/16   Samuella Cota, MD  ferrous sulfate 325 (65 FE) MG tablet Take 325 mg by mouth every evening.     [provider]  gabapentin (NEURONTIN) 300 MG capsule TAKE 1 CAPSULE BY MOUTH THREE TIMES A DAY. Patient taking differently: Take 300 mg by mouth daily as needed (for pain). PRESCRIBED 1 CAPSULE BY MOUTH THREE TIMES A DAY. 09/05/17  Nafziger, Tommi Rumps, NP  meclizine (ANTIVERT) 25 MG tablet Take 25 mg by mouth as needed for dizziness or nausea.  08/28/14   [provider]  Pediatric Multivitamins-Iron (FLINTSTONES PLUS IRON) chewable tablet Chew 2 tablets by mouth daily. 05/30/16   Samuella Cota, MD  warfarin (COUMADIN) 5 MG tablet TAKE 1/2 TABLET BY MOUTH ON WEDNESDAY AND 1 TABLET ALL OTHER DAYS OR AS DIRECTED BY ANTICOAGULATION CLINIC. Patient taking differently: Take 2.5-5 mg by mouth See admin instructions. 1/2 tablet on all days except on Sundays Tuesdays and Thursdays. Take 1 tablet on these days. 12/07/17   Dorothyann Peng, NP    Physical Exam: BP (!) 190/92   Pulse 88   Temp 98 F (36.7 C) (Oral)   Resp 18   Ht 5\' 2"  (1.575 m)   Wt 54 kg   SpO2 93%   BMI 21.77 kg/m   . General: Elderly Caucasian female. Awake and alert and oriented x3. No acute cardiopulmonary distress.  [name] HEENT: Normocephalic atraumatic.  Right and left ears normal in appearance.  Pupils equal, round, reactive to light. Extraocular muscles are intact. Sclerae anicteric and noninjected.  Moist mucosal membranes. No mucosal lesions.  . Neck: Neck supple  without lymphadenopathy. No carotid bruits. No masses palpated.  . Cardiovascular: Regular rate with normal S1-S2 sounds. No murmurs, rubs, gallops auscultated. No JVD.  [name] Respiratory: Good respiratory effort.  Rales in the right middle/lower lobe. No rhonchi. Lungs clear to auscultation bilaterally.  No accessory muscle use. . Abdomen: Soft, tenderness in the epigastric area without rebound and is nondistended. Active bowel sounds. No masses or hepatosplenomegaly  . Skin: No rashes, lesions, or ulcerations.  Dry, warm to touch. 2+ dorsalis pedis and radial pulses. . Musculoskeletal: No calf or leg pain. All major joints not erythematous nontender.  No upper or lower joint deformation.  Good ROM.  No contractures  . Psychiatric: Intact judgment and insight. Pleasant and cooperative. . Neurologic: No focal neurological deficits. Strength is 5/5 and symmetric in upper and lower extremities.  Cranial nerves II through XII are grossly intact.           Labs on Admission: I have personally reviewed following labs and imaging studies  CBC: Recent Labs  Lab 02/15/18 1504  WBC 5.3  NEUTROABS 3.2  HGB 12.5  HCT 37.6  MCV 87.4  PLT 893   Basic Metabolic Panel: Recent Labs  Lab 02/15/18 1504  NA 139  K 4.2  CL 106  CO2 27  GLUCOSE 141*  BUN 9  CREATININE 0.83  CALCIUM 8.6*   GFR: Estimated Creatinine Clearance: 44.2 mL/min (by C-G formula based on SCr of 0.83 mg/dL). Liver Function Tests: Recent Labs  Lab 02/15/18 1504  AST 17  ALT 16  ALKPHOS 79  BILITOT 0.9  PROT 6.5  ALBUMIN 3.6   Recent Labs  Lab 02/15/18 1504  LIPASE 22   No results for input(s): AMMONIA in the last 168 hours. Coagulation Profile: Recent Labs  Lab 02/15/18 1504  INR 1.92   Cardiac Enzymes: Recent Labs  Lab 02/15/18 1504  TROPONINI <0.03   BNP (last 3 results) No results for input(s): PROBNP in the last 8760 hours. HbA1C: No results for input(s): HGBA1C in the last 72 hours. CBG: No  results for input(s): GLUCAP in the last 168 hours. Lipid Profile: No results for input(s): CHOL, HDL, LDLCALC, TRIG, CHOLHDL, LDLDIRECT in the last 72 hours. Thyroid Function Tests: No results for input(s): TSH, T4TOTAL, FREET4, T3FREE,  THYROIDAB in the last 72 hours. Anemia Panel: No results for input(s): VITAMINB12, FOLATE, FERRITIN, TIBC, IRON, RETICCTPCT in the last 72 hours. Urine analysis:    Component Value Date/Time   COLORURINE YELLOW 02/15/2018 [place] 02/15/2018 1549   LABSPEC 1.016 02/15/2018 1549   PHURINE 6.0 02/15/2018 1549   GLUCOSEU NEGATIVE 02/15/2018 1549   HGBUR MODERATE (A) 02/15/2018 1549   BILIRUBINUR NEGATIVE 02/15/2018 1549   BILIRUBINUR n 01/29/2018 1029   KETONESUR NEGATIVE 02/15/2018 1549   PROTEINUR NEGATIVE 02/15/2018 1549   UROBILINOGEN 0.2 01/29/2018 1029   UROBILINOGEN 0.2 05/04/2015 2308   NITRITE NEGATIVE 02/15/2018 1549   LEUKOCYTESUR NEGATIVE 02/15/2018 1549   Sepsis Labs: @LABRCNTIP (procalcitonin:4,lacticidven:4) )No results found for this or any previous visit (from the past 240 hour(s)).   Radiological Exams on Admission: Dg Chest 2 View  Result Date: 02/15/2018 CLINICAL DATA:  Hypertension EXAM: CHEST - 2 VIEW COMPARISON:  December 19, 2017 FINDINGS: There is airspace opacity in the lateral right base with small right pleural effusion. There is also a small left pleural effusion. There is consolidation in the medial right base. Lungs elsewhere are clear. Heart is upper normal in size with mild pulmonary venous hypertension. No adenopathy. There is aortic atherosclerosis. There is postoperative change in the lower cervical region. There is degenerative change in the thoracic spine. Bones are osteoporotic. IMPRESSION: Consolidation in the medial and lateral aspects of the right base, felt to represent pneumonia. Small pleural effusions bilaterally. There is a degree of pulmonary vascular congestion. There is aortic atherosclerosis.  Bones are osteoporotic. Aortic Atherosclerosis (ICD10-I70.0). Followup PA and lateral chest radiographs recommended in 3-4 weeks following trial of antibiotic therapy to ensure resolution and exclude underlying malignancy. Electronically Signed   By: Lowella Grip III M.D.   On: 02/15/2018 16:08    EKG: Independently reviewed.  Atrial fibrillation.  No acute ST changes  Assessment/Plan: Principal Problem:   CAP (community acquired pneumonia) Active Problems:   PAF (paroxysmal atrial fibrillation) ([name])   Chronic diastolic congestive heart failure ([name])   Complaints of total body pain   Uncontrolled hypertension   Shortness of breath   GERD (gastroesophageal reflux disease)   Anticoagulated on Coumadin    This patient was discussed with the ED physician, including pertinent vitals, physical exam findings, labs, and imaging.  We also discussed care given by the ED provider.  1. Community-acquired pneumonia Antibiotics: Rocephin and azithromycin Robitussin Sputum cultures CBC tomorrow Strep antigen by urine 2. Total body pain a. Uncertain as to how much this is chronic versus acute b. We will add Toradol for pain control 3. Uncontrolled hypertension a. We will give her evening medications and see if this improves patient's blood pressure b. If not controlled, will add second oral agent such as clonidine which patient was on in the past 4. PAF a. At this point, atrial for ablation appears more consistent b. Continue anticoagulation c. We will do echocardiogram tomorrow 5. Shortness of breath a. I do not think that this represents PE, given the nature patient's pain b. Will treat for pneumonia c. Question component of heart failure d. Add BNP e. We will give additional dose of Lasix tonight 6. Chronic diastolic heart failure a. Echocardiogram in the morning 7. GERD a. Likely etiology of patient's abdominal pain b. Add Pepcid c. If epigastric pain not controlled with  this, may need imaging 8. Anticoagulation a. Consult pharmacy for warfarin dosing  DVT prophylaxis: On Coumadin Consultants: None Code Status: Full  code Family Communication: Multiple family members present during interview and exam Disposition Plan: Patient should be able to return home   Truett Mainland, DO [facility] Pager [phone]  If 7PM-7AM, please contact night-coverage [email or web address] Password [email or web address]

## 2018-02-15 NOTE — Progress Notes (Signed)
ANTICOAGULATION CONSULT NOTE - Initial Consult  Pharmacy Consult for:  warfarin dosing Indication: paroxysmal atrial fibrillation INR Goal: 2-3 Home dose: 2.5mg  on Mon-Wed-Fri-Sat   and 5mg  on Sun-Tues-Thurs     (25mg /week) Last dose: 5mg  on 02-14-18 Concurrent anti-platelet meds: aspirin 81mg  daily, Toradol Possible Drug Interactions: azithromycin AST/ALT: 17/16 Bili: 0.2 Alb: 3.6  Allergies  Allergen Reactions  . Iodinated Diagnostic Agents Anaphylaxis and Other (See Comments)    IPD dye Info given by patient  . Ioxaglate Anaphylaxis and Other (See Comments)    Info given by patient  . Milk-Related Compounds Anaphylaxis and Other (See Comments)    Lactose intolerance Can tolerate milk if its cooked into the recipe, just can't drink milk   . Red Dye Anaphylaxis  . Whey Other (See Comments)    Lactose intolerance  . Darvon [Propoxyphene] Rash  . Hydralazine Anxiety and Other (See Comments)    Facial flushing, pt prefers not to use it.     Patient Measurements: Height: 5\' 2"  (157.5 cm) Weight: 119 lb 0.8 oz (54 kg) IBW/kg (Calculated) : 50.1 Heparin Dosing Weight:   Vital Signs: Temp: 97.7 F (36.5 C) (08/16 1819) Temp Source: Oral (08/16 1819) BP: 199/95 (08/16 1819) Pulse Rate: 83 (08/16 1819)  Labs: Recent Labs    02/15/18 1504  HGB 12.5  HCT 37.6  PLT 158  LABPROT 21.8*  INR 1.92  CREATININE 0.83  TROPONINI <0.03    Estimated Creatinine Clearance: 44.2 mL/min (by C-G formula based on SCr of 0.83 mg/dL).   Medical History: Past Medical History:  Diagnosis Date  . Anemia 2005   Generally microcytic, transfusions in [address], 2012, 02/2015, 05/2015  . Aortic stenosis    Moderate November 2017  . Arthritis   . Broken back 2013   Chronic back pain.   [name] CAD (coronary artery disease)    Cardiac catheterization 2014 - 80% mid RCA and 70% OM managed medically  . CHF (congestive heart failure) ([place])   . Chronic headache   . Contrast media allergy   .  Diastolic heart failure ([place])   . Esophageal cancer ([place]) 05/06/2015   Adenocarcinoma GE junction  . Facial paresthesia   . GERD (gastroesophageal reflux disease)   . H/O iron deficiency    05-06-15 iron infusion ([facility])  . HH (hiatus hernia) 2008   Large with associated erosions  . Hypertension   . Paroxysmal atrial fibrillation ([name])   . PONV (postoperative nausea and vomiting)   . Pulmonary emboli ([place]) 2008, 2012  . Stroke [facility]) 1982    Medications:  Scheduled:  . aspirin EC  81 mg Oral Daily  . carvedilol  25 mg Oral BID WC  . ezetimibe  10 mg Oral q1800   And  . simvastatin  20 mg Oral q1800  . famotidine  20 mg Oral BID  . ferrous sulfate  325 mg Oral QPM  . furosemide  40 mg Oral Daily  . pantoprazole  40 mg Oral BID  . temazepam  15 mg Oral QHS  . warfarin  2.5 mg Oral Once  . [START ON 02/16/2018] Warfarin - Pharmacist Dosing Inpatient   Does not apply q1800    Assessment:  Pharmacy consulted to dose warfarin for this 29 yof with paroxysmal atrial fibrillation. She is chronically anti-coagulated on warfarin due to history of PE.  She has complained of SOB and  bilateral arm pain, epigastric and chest pain over the past week. Troponin I is <0.03.  Goal of  Therapy:  INR 2-3 Monitor platelets by anticoagulation protocol: Yes   Plan:  Give warfarin 2.5mg  tonight (pt's home dose) Pharmacy will continue to monitor CBC and daily INR according to protocol.    Despina Pole, Pharm. D. Clinical Pharmacist 02/15/2018 7:20 PM

## 2018-02-15 NOTE — Progress Notes (Signed)
Patient Name: [name], female   DOB: 07-01-1941, 77 y.o.  MRN: [id number]  BNP elevated. Will increase lasix to BID and obtain echo tomorrow.  Truett Mainland, DO 02/15/2018 10:01 PM

## 2018-02-15 NOTE — ED Notes (Signed)
Pt reports that she lives with her son  Has had hypertension, abd pain for the last several days  Here after calling her neighbor and pastor as she did not believe she should wait

## 2018-02-16 ENCOUNTER — Observation Stay (HOSPITAL_COMMUNITY): Payer: Medicare Other

## 2018-02-16 ENCOUNTER — Observation Stay (HOSPITAL_BASED_OUTPATIENT_CLINIC_OR_DEPARTMENT_OTHER): Payer: Medicare Other

## 2018-02-16 DIAGNOSIS — C160 Malignant neoplasm of cardia: Secondary | ICD-10-CM | POA: Diagnosis not present

## 2018-02-16 DIAGNOSIS — E782 Mixed hyperlipidemia: Secondary | ICD-10-CM | POA: Diagnosis not present

## 2018-02-16 DIAGNOSIS — I480 Paroxysmal atrial fibrillation: Secondary | ICD-10-CM | POA: Diagnosis not present

## 2018-02-16 DIAGNOSIS — I10 Essential (primary) hypertension: Secondary | ICD-10-CM | POA: Diagnosis not present

## 2018-02-16 DIAGNOSIS — I361 Nonrheumatic tricuspid (valve) insufficiency: Secondary | ICD-10-CM | POA: Diagnosis not present

## 2018-02-16 DIAGNOSIS — I2510 Atherosclerotic heart disease of native coronary artery without angina pectoris: Secondary | ICD-10-CM

## 2018-02-16 DIAGNOSIS — Z7901 Long term (current) use of anticoagulants: Secondary | ICD-10-CM

## 2018-02-16 DIAGNOSIS — K449 Diaphragmatic hernia without obstruction or gangrene: Secondary | ICD-10-CM | POA: Diagnosis not present

## 2018-02-16 DIAGNOSIS — J90 Pleural effusion, not elsewhere classified: Secondary | ICD-10-CM | POA: Diagnosis not present

## 2018-02-16 LAB — PROTIME-INR
INR: 1.84
Prothrombin Time: 21.1 seconds — ABNORMAL HIGH (ref 11.4–15.2)

## 2018-02-16 LAB — CBC
HCT: 35.6 % — ABNORMAL LOW (ref 36.0–46.0)
HEMOGLOBIN: 11.9 g/dL — AB (ref 12.0–15.0)
MCH: 29.4 pg (ref 26.0–34.0)
MCHC: 33.4 g/dL (ref 30.0–36.0)
MCV: 87.9 fL (ref 78.0–100.0)
PLATELETS: 157 10*3/uL (ref 150–400)
RBC: 4.05 MIL/uL (ref 3.87–5.11)
RDW: 12.9 % (ref 11.5–15.5)
WBC: 6 10*3/uL (ref 4.0–10.5)

## 2018-02-16 LAB — TROPONIN I

## 2018-02-16 LAB — BASIC METABOLIC PANEL
ANION GAP: 9 (ref 5–15)
BUN: 14 mg/dL (ref 8–23)
CHLORIDE: 107 mmol/L (ref 98–111)
CO2: 25 mmol/L (ref 22–32)
Calcium: 8.4 mg/dL — ABNORMAL LOW (ref 8.9–10.3)
Creatinine, Ser: 0.9 mg/dL (ref 0.44–1.00)
GFR calc Af Amer: >60 mL/min (ref >60)
GFR calc non Af Amer: 60 mL/min — ABNORMAL LOW (ref >60)
GLUCOSE: 132 mg/dL — AB (ref 70–99)
POTASSIUM: 4.4 mmol/L (ref 3.5–5.1)
Sodium: 141 mmol/L (ref 135–145)

## 2018-02-16 LAB — ECHOCARDIOGRAM COMPLETE
HEIGHTINCHES: 62 in
WEIGHTICAEL: 1904.77 [oz_av]

## 2018-02-16 LAB — PROCALCITONIN: PROCALCITONIN: 0.2 ng/mL

## 2018-02-16 LAB — STREP PNEUMONIAE URINARY ANTIGEN: STREP PNEUMO URINARY ANTIGEN: NEGATIVE

## 2018-02-16 LAB — CK: Total CK: 35 U/L — ABNORMAL LOW (ref 38–234)

## 2018-02-16 MED ORDER — TRAMADOL HCL 50 MG PO TABS
100.0000 mg | ORAL_TABLET | Freq: Four times a day (QID) | ORAL | Status: DC | PRN
Start: 2018-02-16 — End: 2018-02-18
  Administered 2018-02-16 – 2018-02-17 (×3): 100 mg via ORAL
  Filled 2018-02-16 (×4): qty 2

## 2018-02-16 MED ORDER — METRONIDAZOLE IN NACL 5-0.79 MG/ML-% IV SOLN
500.0000 mg | Freq: Three times a day (TID) | INTRAVENOUS | Status: DC
  Administered 2018-02-16 – 2018-02-18 (×6): 500 mg via INTRAVENOUS
  Filled 2018-02-16 (×6): qty 100

## 2018-02-16 MED ORDER — WARFARIN SODIUM 5 MG PO TABS
5.0000 mg | ORAL_TABLET | Freq: Once | ORAL | Status: AC
  Administered 2018-02-16: 5 mg via ORAL
  Filled 2018-02-16: qty 1

## 2018-02-16 MED ORDER — FUROSEMIDE 40 MG PO TABS
40.0000 mg | ORAL_TABLET | Freq: Every day | ORAL | Status: DC
  Administered 2018-02-17 – 2018-02-18 (×2): 40 mg via ORAL
  Filled 2018-02-16 (×2): qty 1

## 2018-02-16 NOTE — Progress Notes (Signed)
PROGRESS NOTE  [name] ZTI:[id number] DOB: 07-16-1940 DOA: 02/15/2018 PCP: Dorothyann Peng, NP  Brief History:  77 year old female with history of aortic stenosis, coronary artery disease, diastolic CHF, PE x2 on warfarin, atrial for ablation, stroke, adenocarcinoma of the GE junction, and hyperlipidemia presenting with numerous complaints of pain including pain of her back, arms, chest, legs, and abdomen.  After extensive interviewing, it appears that her overriding complaint is upper abdominal pain which she has had for the past 1 to 2 weeks.  She feels that food makes it slightly worse as well as sitting up.  She has some nausea but denies any emesis.  She has had some loose stools without any hematochezia.  She claims to have some melena.  The patient denies any fevers, chills, coughing, hemoptysis.  She did have some shortness of breath on the day of admission, but this has improved.  She has had decreased oral intake as a result of her abdominal pain and nausea.  She had also complained of intermittent dysuria.  Evaluation in the emergency department revealed right lower lobe opacity concerning for pneumonia.  Patient was started on ceftriaxone and azithromycin.  She has been afebrile hemodynamically stable with a T-max of 99.0 F.  Oxygen saturation has been 93-95% on RA  Assessment/Plan: Abdominal Pain -CT abd/pelvis -increase tramadol to 100mg  q 6 hours prn  -she has numerous medication intolerances -UA neg for pyruia  Pulmonary Opacity -clinically not consistent with pneumonia -CT chest -check procalcitonin -continue ceftriaxone and azithromycin pending work up  Chest pain with history of CAD -cycle troponins -personally reviewed EKG--afib without ST-T changes -atypical be clinical hx -12/20/17 Echo--EF 65-70%, moderate left ear, no WMA, mild TR/MR  Chronic diastolic CHF -She appears to be clinically euvolemic -Continue furosemide home dose -Daily  weights -Accurate I's and O's  Paroxysmal Atrial Fibrillation -continue warfarin -rate controlled  Essential hypertension -Continue carvedilol  Hyperlipidemia -Continue Zetia and simvastatin  GERD -Continue Protonix  History of PE -continue warfarin  Hyperlipidemia -continue Vytorin   Disposition Plan:   Home in 1-2 days  Family Communication:   Son updatded at bedside 02/16/18--Total time spent 35 minutes.  Greater than 50% spent face to face counseling and coordinating care.   Consultants:  none  Code Status:  FULL  DVT Prophylaxis:  coumadin   Procedures: As Listed in Progress Note Above  Antibiotics: Ceftriaxone 8/16>>> Azithromycin 8/16>>>    Subjective: Pt c/o upper abd pain with nausea.  No vomiting, diarrhea, hematochezia.  No headache, cp, sob, hemoptysis.  C/o intermittent dysuria.  abd pain is dull and severe radiating to back  Objective: Vitals:   02/15/18 1819 02/15/18 1919 02/15/18 2228 02/16/18 0551  BP: (!) 199/95 120/64 120/62 (!) 166/89  Pulse: 83 97 (!) 104 86  Resp: 16  14 15   Temp: 97.7 F (36.5 C)  98.5 F (36.9 C) 99 F (37.2 C)  TempSrc: Oral  Oral Oral  SpO2: 95%  92% 93%  Weight: 54 kg     Height: 5\' 2"  (1.575 m)       Intake/Output Summary (Last 24 hours) at 02/16/2018 0943 Last data filed at 02/15/2018 1805 Gross per 24 hour  Intake 113.33 ml  Output -  Net 113.33 ml   Weight change:  Exam:   General:  Pt is alert, follows commands appropriately, not in acute distress  HEENT: No icterus, No thrush, No neck mass, [place]/AT  Cardiovascular: RRR, S1/S2,  no rubs, no gallops  Respiratory: bibasilar rales, no wheeze  Abdomen: Soft/+BS, mild epigastric tender, non distended, no guarding  Extremities: No edema, No lymphangitis, No petechiae, No rashes, no synovitis   Data Reviewed: I have personally reviewed following labs and imaging studies Basic Metabolic Panel: Recent Labs  Lab 02/15/18 1504 02/16/18 0644   NA 139 141  K 4.2 4.4  CL 106 107  CO2 27 25  GLUCOSE 141* 132*  BUN 9 14  CREATININE 0.83 0.90  CALCIUM 8.6* 8.4*   Liver Function Tests: Recent Labs  Lab 02/15/18 1504  AST 17  ALT 16  ALKPHOS 79  BILITOT 0.9  PROT 6.5  ALBUMIN 3.6   Recent Labs  Lab 02/15/18 1504  LIPASE 22   No results for input(s): AMMONIA in the last 168 hours. Coagulation Profile: Recent Labs  Lab 02/15/18 1504 02/16/18 0644  INR 1.92 1.84   CBC: Recent Labs  Lab 02/15/18 1504 02/16/18 0644  WBC 5.3 6.0  NEUTROABS 3.2  --   HGB 12.5 11.9*  HCT 37.6 35.6*  MCV 87.4 87.9  PLT 158 157   Cardiac Enzymes: Recent Labs  Lab 02/15/18 1504  TROPONINI <0.03   BNP: Invalid input(s): POCBNP CBG: No results for input(s): GLUCAP in the last 168 hours. HbA1C: No results for input(s): HGBA1C in the last 72 hours. Urine analysis:    Component Value Date/Time   COLORURINE YELLOW 02/15/2018 [place] 02/15/2018 1549   LABSPEC 1.016 02/15/2018 1549   PHURINE 6.0 02/15/2018 1549   GLUCOSEU NEGATIVE 02/15/2018 1549   HGBUR MODERATE (A) 02/15/2018 1549   BILIRUBINUR NEGATIVE 02/15/2018 1549   BILIRUBINUR n 01/29/2018 1029   KETONESUR NEGATIVE 02/15/2018 1549   PROTEINUR NEGATIVE 02/15/2018 1549   UROBILINOGEN 0.2 01/29/2018 1029   UROBILINOGEN 0.2 05/04/2015 2308   NITRITE NEGATIVE 02/15/2018 1549   LEUKOCYTESUR NEGATIVE 02/15/2018 1549   Sepsis Labs: @LABRCNTIP (procalcitonin:4,lacticidven:4) )No results found for this or any previous visit (from the past 240 hour(s)).   Scheduled Meds: . aspirin EC  81 mg Oral Daily  . carvedilol  25 mg Oral BID WC  . ezetimibe  10 mg Oral q1800   And  . simvastatin  20 mg Oral q1800  . famotidine  20 mg Oral BID  . furosemide  40 mg Oral BID  . pantoprazole  40 mg Oral BID  . temazepam  15 mg Oral QHS  . Warfarin - Pharmacist Dosing Inpatient   Does not apply q1800   Continuous Infusions: . azithromycin    . cefTRIAXone  (ROCEPHIN)  IV      Procedures/Studies: Dg Chest 2 View  Result Date: 02/15/2018 CLINICAL DATA:  Hypertension EXAM: CHEST - 2 VIEW COMPARISON:  December 19, 2017 FINDINGS: There is airspace opacity in the lateral right base with small right pleural effusion. There is also a small left pleural effusion. There is consolidation in the medial right base. Lungs elsewhere are clear. Heart is upper normal in size with mild pulmonary venous hypertension. No adenopathy. There is aortic atherosclerosis. There is postoperative change in the lower cervical region. There is degenerative change in the thoracic spine. Bones are osteoporotic. IMPRESSION: Consolidation in the medial and lateral aspects of the right base, felt to represent pneumonia. Small pleural effusions bilaterally. There is a degree of pulmonary vascular congestion. There is aortic atherosclerosis. Bones are osteoporotic. Aortic Atherosclerosis (ICD10-I70.0). Followup PA and lateral chest radiographs recommended in 3-4 weeks following trial of antibiotic therapy to ensure  resolution and exclude underlying malignancy. Electronically Signed   By: Lowella Grip III M.D.   On: 02/15/2018 16:08    Orson Eva, DO  [facility] Pager [phone]  If 7PM-7AM, please contact night-coverage [email or web address] Password [email or web address] 02/16/2018, 9:43 AM   LOS: 0 days

## 2018-02-16 NOTE — Progress Notes (Signed)
ANTICOAGULATION CONSULT NOTE - Follow Up Consult  Pharmacy Consult for warfarin dosing Indication: paroxysmal atrial fibrillation  Allergies  Allergen Reactions  . Iodinated Diagnostic Agents Anaphylaxis and Other (See Comments)    IPD dye Info given by patient  . Ioxaglate Anaphylaxis and Other (See Comments)    Info given by patient  . Milk-Related Compounds Anaphylaxis and Other (See Comments)    Lactose intolerance Can tolerate milk if its cooked into the recipe, just can't drink milk   . Red Dye Anaphylaxis  . Whey Other (See Comments)    Lactose intolerance  . Darvon [Propoxyphene] Rash  . Hydralazine Anxiety and Other (See Comments)    Facial flushing, pt prefers not to use it.     Patient Measurements: Height: 5\' 2"  (157.5 cm) Weight: 119 lb 0.8 oz (54 kg) IBW/kg (Calculated) : 50.1 Heparin Dosing Weight: n/a  Vital Signs: Temp: 98.8 F (37.1 C) (08/17 1300) Temp Source: Oral (08/17 1300) BP: 145/65 (08/17 1300) Pulse Rate: 86 (08/17 0551)  Labs: Recent Labs    02/15/18 1504 02/16/18 0644 02/16/18 1023  HGB 12.5 11.9*  --   HCT 37.6 35.6*  --   PLT 158 157  --   LABPROT 21.8* 21.1*  --   INR 1.92 1.84  --   CREATININE 0.83 0.90  --   CKTOTAL  --   --  35*  TROPONINI <0.03  --  <0.03    Estimated Creatinine Clearance: 40.7 mL/min (by C-G formula based on SCr of 0.9 mg/dL).  Assessment: INR is  1.84 today, which is below desired therapeutic goal. Will give booster dose today.    Plan:  Give warfarin 5mg  today x1 dose.   Despina Pole, Pharm. D. Clinical Pharmacist 02/16/2018 2:36 PM

## 2018-02-16 NOTE — Progress Notes (Signed)
*  PRELIMINARY RESULTS* Echocardiogram 2D Echocardiogram has been performed.  [name] 02/16/2018, 2:46 PM

## 2018-02-17 DIAGNOSIS — Z7901 Long term (current) use of anticoagulants: Secondary | ICD-10-CM | POA: Diagnosis not present

## 2018-02-17 DIAGNOSIS — J90 Pleural effusion, not elsewhere classified: Secondary | ICD-10-CM

## 2018-02-17 DIAGNOSIS — C160 Malignant neoplasm of cardia: Secondary | ICD-10-CM | POA: Diagnosis not present

## 2018-02-17 DIAGNOSIS — I480 Paroxysmal atrial fibrillation: Secondary | ICD-10-CM | POA: Diagnosis not present

## 2018-02-17 DIAGNOSIS — I5032 Chronic diastolic (congestive) heart failure: Secondary | ICD-10-CM | POA: Diagnosis not present

## 2018-02-17 DIAGNOSIS — I10 Essential (primary) hypertension: Secondary | ICD-10-CM | POA: Diagnosis not present

## 2018-02-17 DIAGNOSIS — R1084 Generalized abdominal pain: Secondary | ICD-10-CM | POA: Diagnosis not present

## 2018-02-17 DIAGNOSIS — E782 Mixed hyperlipidemia: Secondary | ICD-10-CM | POA: Diagnosis not present

## 2018-02-17 LAB — CBC
HEMATOCRIT: 35.2 % — AB (ref 36.0–46.0)
Hemoglobin: 11.8 g/dL — ABNORMAL LOW (ref 12.0–15.0)
MCH: 29.3 pg (ref 26.0–34.0)
MCHC: 33.5 g/dL (ref 30.0–36.0)
MCV: 87.3 fL (ref 78.0–100.0)
Platelets: 141 10*3/uL — ABNORMAL LOW (ref 150–400)
RBC: 4.03 MIL/uL (ref 3.87–5.11)
RDW: 12.7 % (ref 11.5–15.5)
WBC: 4.7 10*3/uL (ref 4.0–10.5)

## 2018-02-17 LAB — BASIC METABOLIC PANEL
Anion gap: 9 (ref 5–15)
BUN: 16 mg/dL (ref 8–23)
CO2: 26 mmol/L (ref 22–32)
CREATININE: 0.91 mg/dL (ref 0.44–1.00)
Calcium: 8.2 mg/dL — ABNORMAL LOW (ref 8.9–10.3)
Chloride: 104 mmol/L (ref 98–111)
GFR calc non Af Amer: 59 mL/min — ABNORMAL LOW (ref >60)
Glucose, Bld: 108 mg/dL — ABNORMAL HIGH (ref 70–99)
POTASSIUM: 3.6 mmol/L (ref 3.5–5.1)
SODIUM: 139 mmol/L (ref 135–145)

## 2018-02-17 LAB — PROTIME-INR
INR: 2.47
Prothrombin Time: 26.6 seconds — ABNORMAL HIGH (ref 11.4–15.2)

## 2018-02-17 LAB — HIV ANTIBODY (ROUTINE TESTING W REFLEX): HIV Screen 4th Generation wRfx: NONREACTIVE

## 2018-02-17 MED ORDER — WARFARIN SODIUM 2 MG PO TABS
2.0000 mg | ORAL_TABLET | Freq: Once | ORAL | Status: AC
  Administered 2018-02-17: 2 mg via ORAL
  Filled 2018-02-17: qty 1

## 2018-02-17 NOTE — Consult Note (Signed)
Referring Provider: No ref. provider found Primary Care Physician:  Dorothyann Peng, NP Primary Gastroenterologist:  [place]   Reason for Consultation:  Abdominal pain  HPI:  Pleasant 77 year old lady with multiple medical problems including aortic stenosis, atrial fibrillation, history of stroke and pulmonary embolus on chronic anticoagulation admitted to the hospital with pleural effusions/opacities initially felt to be secondary to pneumonia. She is scheduled to undergo a thoracentesis tomorrow.  GI consult because of a history of postprandial abdominal pain over the past 3 months. She says for the week prior to her admission she had the worst abdominal pain of her life-" worse than child birth.  States over the past 3 months she has had an aversion to eating. She relates bandlike upper abdominal pain after she does eat which may last for hours. She "picks" at her meals. She states she's lost 20 pounds during this period of time.  She denies vomiting, odynophagia, dysphagia, early satiety. She does have paper hematochezia from time to time. No melena.  History of limited stage esophageal cancer - status post EMR. Clinically, felt to be in remission.  Long-standing history of chronic iron deficiency anemia worked up with the EGD, colonoscopy and capsule study previously. She has received periodic iron infusions.  Gallbladder is out. No biliary dilation on recent CT. Lipase/LFTs normal.  I personally reviewed the CT images with the original reader, Dr. Abigail Miyamoto. This was globally a poor quality study.  No obvious GI pathology. Area of abnormality around the hepatic flexure questionable and may well be artifactual.  No conclusions can be drawn as to patency of mesenteric vessels on this poor quality study. (Renal function normal but a history of anaphylaxis with IV contrast previously)    Past Medical History:  Diagnosis Date  . Anemia 2005   Generally microcytic,  transfusions in [address], 2012, 02/2015, 05/2015  . Aortic stenosis    Moderate November 2017  . Arthritis   . Broken back 2013   Chronic back pain.   [name] CAD (coronary artery disease)    Cardiac catheterization 2014 - 80% mid RCA and 70% OM managed medically  . CHF (congestive heart failure) ([place])   . Chronic headache   . Contrast media allergy   . Diastolic heart failure ([place])   . Esophageal cancer ([place]) 05/06/2015   Adenocarcinoma GE junction  . Facial paresthesia   . GERD (gastroesophageal reflux disease)   . H/O iron deficiency    05-06-15 iron infusion ([facility])  . HH (hiatus hernia) 2008   Large with associated erosions  . Hypertension   . Paroxysmal atrial fibrillation ([name])   . PONV (postoperative nausea and vomiting)   . Pulmonary emboli ([place]) 2008, 2012  . Stroke [facility]) 1982    Past Surgical History:  Procedure Laterality Date  . APPENDECTOMY  1950s  . BACK SURGERY    . CARPAL TUNNEL RELEASE Bilateral 1990s  . [place] SURGERY  2014  . CHOLECYSTECTOMY  1980s   open  . COLONOSCOPY N/A 02/17/2015   Procedure: COLONOSCOPY;  Surgeon: [name], MD;  Location: [facility] ENDOSCOPY;  Service: Endoscopy;  Laterality: N/A;  . ESOPHAGOGASTRODUODENOSCOPY N/A 02/16/2015   Procedure: ESOPHAGOGASTRODUODENOSCOPY (EGD);  Surgeon: [name], MD;  Location: [facility];  Service: Endoscopy;  Laterality: N/A;  . ESOPHAGOGASTRODUODENOSCOPY N/A 05/06/2015   Procedure: ESOPHAGOGASTRODUODENOSCOPY (EGD);  Surgeon: [name], MD;  Location: [facility] ENDOSCOPY;  Service: Endoscopy;  Laterality: N/A;  . EUS N/A 05/20/2015   Procedure: UPPER  ENDOSCOPIC ULTRASOUND (EUS) LINEAR;  Surgeon: [name], MD;  Location: [facility] ENDOSCOPY;  Service: Endoscopy;  Laterality: N/A;  . exploratory lab  1950s or 1960s  . GIVENS CAPSULE STUDY N/A 05/06/2015   Procedure: GIVENS CAPSULE STUDY;  Surgeon: [name], MD;  Location: [facility] ENDOSCOPY;  Service: Endoscopy;  Laterality: N/A;  . LEFT HEART CATH AND  CORONARY ANGIOGRAPHY N/A 11/08/2016   Procedure: Left Heart Cath and Coronary Angiography;  Surgeon: [name], MD;  Location: [facility] CV LAB;  Service: Cardiovascular;  Laterality: N/A;  . lumbar back surgery  2012  . [place] SURGERY  1991  . TONSILLECTOMY    . TONSILLECTOMY AND ADENOIDECTOMY  1960s    Prior to Admission medications   Medication Sig Start Date End Date Taking? Authorizing Provider  acetaminophen (TYLENOL) 500 MG tablet Take 500-1,000 mg by mouth daily as needed for mild pain or moderate pain.    Yes [provider]  aspirin EC 81 MG tablet Take 1 tablet (81 mg total) by mouth daily. 11/02/16  Yes Hilty, Nadean Corwin, MD  carvedilol (COREG) 25 MG tablet Take 1 tablet (25 mg total) by mouth 2 (two) times daily with a meal. 01/29/18 02/28/18 Yes Nafziger, Tommi Rumps, NP  cyanocobalamin (,VITAMIN B-12,) 1000 MCG/ML injection IM daily 11/29-12/3, then weekly x4, then monthly. Patient taking differently: Inject 1,000 mcg into the muscle every 30 (thirty) days.  05/30/16  Yes Samuella Cota, MD  ezetimibe-simvastatin (VYTORIN) 10-20 MG tablet Take 1 tablet by mouth daily. 09/10/17  Yes Hilty, Nadean Corwin, MD  ferrous sulfate 325 (65 FE) MG tablet Take 325 mg by mouth every evening.    Yes [provider]  furosemide (LASIX) 40 MG tablet TAKE 1 TABLET BY MOUTH ONCE DAILY. 11/13/17  Yes Nafziger, Tommi Rumps, NP  gabapentin (NEURONTIN) 300 MG capsule TAKE 1 CAPSULE BY MOUTH THREE TIMES A DAY. Patient taking differently: Take 300 mg by mouth daily as needed (for pain). PRESCRIBED 1 CAPSULE BY MOUTH THREE TIMES A DAY. 09/05/17  Yes Nafziger, Tommi Rumps, NP  meclizine (ANTIVERT) 25 MG tablet Take 25 mg by mouth as needed for dizziness or nausea.  08/28/14  Yes [provider]  pantoprazole (PROTONIX) 40 MG tablet TAKE 1 TABLET BY MOUTH TWICE DAILY. 12/05/17  Yes Nafziger, Tommi Rumps, NP  Pediatric Multivitamins-Iron (FLINTSTONES PLUS IRON) chewable tablet Chew 2 tablets by mouth daily.  05/30/16  Yes Samuella Cota, MD  temazepam (RESTORIL) 15 MG capsule TAKE (1) CAPSULE BY MOUTH AT BEDTIME. 12/12/17  Yes Nafziger, Tommi Rumps, NP  traMADol (ULTRAM) 50 MG tablet TAKE (1) TABLET BY MOUTH EVERY EIGHT HOURS. 02/12/18  Yes Nafziger, Tommi Rumps, NP  warfarin (COUMADIN) 5 MG tablet TAKE 1/2 TABLET BY MOUTH ON WEDNESDAY AND 1 TABLET ALL OTHER DAYS OR AS DIRECTED BY ANTICOAGULATION CLINIC. Patient taking differently: Take 2.5-5 mg by mouth See admin instructions. Sunday, Thursday, and Sat take a whole tablet. Rest of week take a 1/2 tablet. 12/07/17  Yes Nafziger, Tommi Rumps, NP    Current Facility-Administered Medications  Medication Dose Route Frequency Provider Last Rate Last Dose  . albuterol (PROVENTIL) (2.5 MG/3ML) 0.083% nebulizer solution 3 mL  3 mL Inhalation Q6H PRN Truett Mainland, DO      . aspirin EC tablet 81 mg  81 mg Oral Daily Truett Mainland, DO   81 mg at 02/17/18 0920  . carvedilol (COREG) tablet 25 mg  25 mg Oral BID WC Truett Mainland, DO   25 mg  at 02/17/18 0852  . cefTRIAXone (ROCEPHIN) 1 g in sodium chloride 0.9 % 100 mL IVPB  1 g Intravenous Q24H Truett Mainland, DO   Stopped at 02/16/18 1740  . ezetimibe (ZETIA) tablet 10 mg  10 mg Oral q1800 Truett Mainland, DO   10 mg at 02/16/18 1739   And  . simvastatin (ZOCOR) tablet 20 mg  20 mg Oral q1800 Truett Mainland, DO   20 mg at 02/16/18 1739  . famotidine (PEPCID) tablet 20 mg  20 mg Oral BID Truett Mainland, DO   20 mg at 02/17/18 0920  . furosemide (LASIX) tablet 40 mg  40 mg Oral Daily Tat, Shanon Brow, MD   40 mg at 02/17/18 0920  . gabapentin (NEURONTIN) capsule 300 mg  300 mg Oral Daily PRN Truett Mainland, DO      . ketorolac (TORADOL) 15 MG/ML injection 15 mg  15 mg Intravenous Q6H PRN Truett Mainland, DO   15 mg at 02/16/18 2012  . meclizine (ANTIVERT) tablet 25 mg  25 mg Oral QID PRN Truett Mainland, DO      . metroNIDAZOLE (FLAGYL) IVPB 500 mg  500 mg Intravenous Franco Collet, MD 100 mL/hr at 02/17/18 0922 500 mg  at 02/17/18 0922  . ondansetron (ZOFRAN) injection 4 mg  4 mg Intravenous Q6H PRN Truett Mainland, DO   4 mg at 02/15/18 1855  . pantoprazole (PROTONIX) EC tablet 40 mg  40 mg Oral BID Truett Mainland, DO   40 mg at 02/17/18 0920  . temazepam (RESTORIL) capsule 15 mg  15 mg Oral QHS Truett Mainland, DO   15 mg at 02/16/18 2150  . traMADol (ULTRAM) tablet 100 mg  100 mg Oral Q6H PRN Orson Eva, MD   100 mg at 02/17/18 0920  . Warfarin - Pharmacist Dosing Inpatient   Does not apply q1800 Truett Mainland, DO        Allergies as of 02/15/2018 - Review Complete 02/15/2018  Allergen Reaction Noted  . Iodinated diagnostic agents Anaphylaxis and Other (See Comments) 10/04/2010  . Ioxaglate Anaphylaxis and Other (See Comments) 09/05/2015  . Milk-related compounds Anaphylaxis and Other (See Comments) 02/06/2014  . Red dye Anaphylaxis 03/24/2016  . Whey Other (See Comments) 09/05/2015  . Darvon [propoxyphene] Rash 02/06/2014  . Hydralazine Anxiety and Other (See Comments) 08/24/2014    Family History  Problem Relation Age of Onset  . Stroke Mother   . Heart disease Mother   . Emphysema Father   . Ovarian cancer Sister   . Stroke Sister   . Other Child        died at birth    Social History   Socioeconomic History  . Marital status: Divorced    Spouse name: Not on file  . Number of children: 3  . Years of education: 21  . Highest education level: Not on file  Occupational History  . Occupation: Retired  Scientific laboratory technician  . Financial resource strain: Not on file  . Food insecurity:    Worry: Not on file    Inability: Not on file  . Transportation needs:    Medical: Not on file    Non-medical: Not on file  Tobacco Use  . Smoking status: Never Smoker  . Smokeless tobacco: Never Used  Substance and Sexual Activity  . Alcohol use: No    Alcohol/week: 0.0 standard drinks  . Drug use: No  . Sexual activity: Not Currently  Lifestyle  . Physical activity:    Days per week: Not on  file    Minutes per session: Not on file  . Stress: Not on file  Relationships  . Social connections:    Talks on phone: Not on file    Gets together: Not on file    Attends religious service: Not on file    Active member of club or organization: Not on file    Attends meetings of clubs or organizations: Not on file    Relationship status: Not on file  . Intimate partner violence:    Fear of current or ex partner: Not on file    Emotionally abused: Not on file    Physically abused: Not on file    Forced sexual activity: Not on file  Other Topics Concern  . Not on file  Social History Narrative   Born and raised in [place], [place]. Currently resides in a house with her son. 1 dog. Fun: go to church   Divorced(Has total of #3 children)-[place], [place], [place] Jun[place]   Denies religious beliefs that would effect health care.    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[employer]: As in history of present illness  Physical Exam: Vital signs in last 24 hours: Temp:  [98.2 F (36.8 C)-99 F (37.2 C)] 98.2 F (36.8 C) (08/18 0657) Pulse Rate:  [83-91] 91 (08/18 0657) Resp:  [17-20] 17 (08/18 0657) BP: (142-173)/(74-79) 173/79 (08/18 0657) SpO2:  [91 %-95 %] 95 % (08/18 0657) Weight:  [53.6 kg] 53.6 kg (08/18 0657) Last BM Date: 02/16/18 General:   Alert,   pleasant and cooperative in NAD Abdomen:  Soft, nontender and nondistended. No masses, hepatosplenomegaly or hernias noted. Normal bowel sounds, without guarding, and without rebound.   Abdomen- nondistended. Positive bowel sounds. No bruits. Mild diffuse tenderness to palpation without appreciable mass or organomegaly. Intake/Output from previous day: 08/17 0701 - 08/18 0700 In: 1171.7 [P.O.:960; IV Piggyback:211.7] Out: 100 [Urine:100] Intake/Output this shift: No intake/output data recorded.  Lab Results: Recent Labs    02/15/18 1504  02/16/18 0644 02/17/18 0640  WBC 5.3 6.0 4.7  HGB 12.5 11.9* 11.8*  HCT 37.6 35.6* 35.2*  PLT 158 157 141*   BMET Recent Labs    02/15/18 1504 02/16/18 0644 02/17/18 0640  NA 139 141 139  K 4.2 4.4 3.6  CL 106 107 104  CO2 27 25 26   GLUCOSE 141* 132* 108*  BUN 9 14 16   CREATININE 0.83 0.90 0.91  CALCIUM 8.6* 8.4* 8.2*   LFT Recent Labs    02/15/18 1504  PROT 6.5  ALBUMIN 3.6  AST 17  ALT 16  ALKPHOS 79  BILITOT 0.9  BILIDIR 0.2  IBILI 0.7   PT/INR Recent Labs    02/16/18 0644 02/17/18 0640  LABPROT 21.1* 26.6*  INR 1.84 2.47   Hepatitis Panel No results for input(s): HEPBSAG, HCVAB, HEPAIGM, HEPBIGM in the last 72 hours. C-Diff No results for input(s): CDIFFTOX in the last 72 hours.  Studies/Results: Ct Abdomen Pelvis Wo Contrast  Result Date: 02/16/2018 CLINICAL DATA:  Upper abdominal pain for 1-2 weeks. Abnormal chest radiograph. Gastroenteritis or colitis suspected. History of coronary artery disease and hypertension. Esophageal cancer. Appendectomy. Cholecystectomy. EXAM: CT CHEST, ABDOMEN AND PELVIS WITHOUT CONTRAST TECHNIQUE: Multidetector CT imaging of the chest, abdomen and pelvis was performed following the standard protocol without IV contrast. COMPARISON:  Chest radiograph 02/15/2018.  Abdominopelvic CT 10/10/2016. Chest CT 05/06/2017. FINDINGS: CT CHEST FINDINGS Cardiovascular: Mild motion degradation throughout. Aortic and branch vessel atherosclerosis. Tortuous thoracic aorta. Mild cardiomegaly with minimal pericardial fluid, likely physiologic. Aortic valve calcification. Multivessel coronary artery atherosclerosis. Mediastinum/Nodes: No supraclavicular adenopathy. No mediastinal or definite hilar adenopathy, given limitations of unenhanced CT. A moderate hiatal hernia Lungs/Pleura: Moderate right and small left pleural effusion. Dependent bibasilar airspace disease, favored to represent atelectasis. Musculoskeletal: No acute osseous abnormality.  Lower cervical spine fixation. CT ABDOMEN PELVIS FINDINGS Hepatobiliary: Suspect a right hepatic lobe cyst, likely present on the prior. 9 mm today. Cholecystectomy, without biliary ductal dilatation. Pancreas: Mild pancreatic atrophy, without duct dilatation. Spleen: Calcified splenic lesion is chronic and likely related to remote infection or trauma. Adrenals/Urinary Tract: Normal adrenal glands. Mild renal cortical thinning bilaterally. 1.5 cm upper pole left renal low-density lesion is likely a cyst. No hydronephrosis. More mild motion degradation continuing into the abdomen and pelvis. No bladder calculi. Stomach/Bowel: Normal distal stomach. Scattered colonic diverticula. Equivocal edema adjacent the hepatic flexure of the colon, including on 64/2. Normal terminal ileum. Appendix not visualized. Normal small bowel. Vascular/Lymphatic: Advanced aortic and branch vessel atherosclerosis. No abdominopelvic adenopathy. Reproductive: Normal uterus and adnexa. Other: Small volume cul-de-sac fluid is new since the prior. Moderate pelvic floor laxity. No evidence of omental or peritoneal disease. Musculoskeletal: Mild osteopenia. Thoracolumbar spondylosis with prior spine spinous process fixation at L4-5. IMPRESSION: 1. Multifactorial degradation, including lack of IV contrast and motion. 2. Bilateral pleural effusions, larger on the right. Dependent bibasilar atelectasis. 3. Moderate hiatal hernia. 4. Coronary artery atherosclerosis. Aortic Atherosclerosis (ICD10-I70.0). Aortic valvular calcifications. Consider echocardiography to evaluate for valvular dysfunction. 5. Equivocal edema adjacent the hepatic flexure of the colon. Cannot exclude mild diverticulitis or less likely colitis. This area is suboptimally evaluated, secondary to above limitations and lack of enteric opacification of the colon. 6. Trace pelvic fluid, possibly related to fluid overload. Electronically Signed   By: Abigail Miyamoto M.D.   On: 02/16/2018  17:10   Dg Chest 2 View  Result Date: 02/15/2018 CLINICAL DATA:  Hypertension EXAM: CHEST - 2 VIEW COMPARISON:  December 19, 2017 FINDINGS: There is airspace opacity in the lateral right base with small right pleural effusion. There is also a small left pleural effusion. There is consolidation in the medial right base. Lungs elsewhere are clear. Heart is upper normal in size with mild pulmonary venous hypertension. No adenopathy. There is aortic atherosclerosis. There is postoperative change in the lower cervical region. There is degenerative change in the thoracic spine. Bones are osteoporotic. IMPRESSION: Consolidation in the medial and lateral aspects of the right base, felt to represent pneumonia. Small pleural effusions bilaterally. There is a degree of pulmonary vascular congestion. There is aortic atherosclerosis. Bones are osteoporotic. Aortic Atherosclerosis (ICD10-I70.0). Followup PA and lateral chest radiographs recommended in 3-4 weeks following trial of antibiotic therapy to ensure resolution and exclude underlying malignancy. Electronically Signed   By: Lowella Grip III M.D.   On: 02/15/2018 16:08   Ct Chest Wo Contrast  Result Date: 02/16/2018 CLINICAL DATA:  Upper abdominal pain for 1-2 weeks. Abnormal chest radiograph. Gastroenteritis or colitis suspected. History of coronary artery disease and hypertension. Esophageal cancer. Appendectomy. Cholecystectomy. EXAM: CT CHEST, ABDOMEN AND PELVIS WITHOUT CONTRAST TECHNIQUE: Multidetector CT imaging of the chest, abdomen and pelvis was performed following the standard protocol without IV contrast. COMPARISON:  Chest radiograph 02/15/2018. Abdominopelvic CT 10/10/2016. Chest CT 05/06/2017. FINDINGS: CT CHEST FINDINGS Cardiovascular: Mild motion degradation throughout. Aortic  and branch vessel atherosclerosis. Tortuous thoracic aorta. Mild cardiomegaly with minimal pericardial fluid, likely physiologic. Aortic valve calcification. Multivessel  coronary artery atherosclerosis. Mediastinum/Nodes: No supraclavicular adenopathy. No mediastinal or definite hilar adenopathy, given limitations of unenhanced CT. A moderate hiatal hernia Lungs/Pleura: Moderate right and small left pleural effusion. Dependent bibasilar airspace disease, favored to represent atelectasis. Musculoskeletal: No acute osseous abnormality. Lower cervical spine fixation. CT ABDOMEN PELVIS FINDINGS Hepatobiliary: Suspect a right hepatic lobe cyst, likely present on the prior. 9 mm today. Cholecystectomy, without biliary ductal dilatation. Pancreas: Mild pancreatic atrophy, without duct dilatation. Spleen: Calcified splenic lesion is chronic and likely related to remote infection or trauma. Adrenals/Urinary Tract: Normal adrenal glands. Mild renal cortical thinning bilaterally. 1.5 cm upper pole left renal low-density lesion is likely a cyst. No hydronephrosis. More mild motion degradation continuing into the abdomen and pelvis. No bladder calculi. Stomach/Bowel: Normal distal stomach. Scattered colonic diverticula. Equivocal edema adjacent the hepatic flexure of the colon, including on 64/2. Normal terminal ileum. Appendix not visualized. Normal small bowel. Vascular/Lymphatic: Advanced aortic and branch vessel atherosclerosis. No abdominopelvic adenopathy. Reproductive: Normal uterus and adnexa. Other: Small volume cul-de-sac fluid is new since the prior. Moderate pelvic floor laxity. No evidence of omental or peritoneal disease. Musculoskeletal: Mild osteopenia. Thoracolumbar spondylosis with prior spine spinous process fixation at L4-5. IMPRESSION: 1. Multifactorial degradation, including lack of IV contrast and motion. 2. Bilateral pleural effusions, larger on the right. Dependent bibasilar atelectasis. 3. Moderate hiatal hernia. 4. Coronary artery atherosclerosis. Aortic Atherosclerosis (ICD10-I70.0). Aortic valvular calcifications. Consider echocardiography to evaluate for valvular  dysfunction. 5. Equivocal edema adjacent the hepatic flexure of the colon. Cannot exclude mild diverticulitis or less likely colitis. This area is suboptimally evaluated, secondary to above limitations and lack of enteric opacification of the colon. 6. Trace pelvic fluid, possibly related to fluid overload. Electronically Signed   By: Abigail Miyamoto M.D.   On: 02/16/2018 17:10   Impression:  Pleasant 77 year old lady with multiple medical problems admitted to the hospital with possible pneumonia / pleural effusion and abdominal pain.  With abdominal pain -  postprandial in nature,  with an aversion to eating, weight loss over the past few months, the possibility of mesenteric ischemia needs to be considered.  Recent worsening of abdominal pain in the past week. Her abdominal examination is nonspecific. History of limited stage esophageal cancer which, clinically, appears to be in remission at this time.  Long history of microcytic anemia requiring intermittent transfusions as outlined.  Overall, recent CT scan was of poor quality.  Subtle abnormality around the hepatic flexure could well be artifactual after discussion with the radiologist.  Lack of IV contrast markedly limits the study.   Recommendations: fully agree with proceeding with the thoracentesis tomorrow. Let's see how she does with this procedure and reassess her abdominal pain post procedure.  If symptoms are persistent, then I would advocate CT angiogram of the abdomen and pelvis. This would enable [place] to get better imaging of the mesenteric vasculature as well as the bowel and solid organs.  Will need to utilize the standard premedication protocol with prednisone and Benadryl to prevent an allergic reaction.  I've discussed this approach with the patient.  She is hesitant to undergo such an evaluation given her prior anaphylactic reaction to contrast and Feels it was associated with stroke.  Further recommendations to  follow.      Notice:  This dictation was prepared with Dragon dictation along with smaller phrase technology. Any transcriptional errors that result  from this process are unintentional and may not be corrected upon review.

## 2018-02-17 NOTE — Progress Notes (Addendum)
PROGRESS NOTE  SPARKLES MCNEELY SAY:[id number] DOB: [birth year] DOA: 02/15/2018 PCP: Dorothyann Peng, NP  Brief History:  77 year old female with history of aortic stenosis, coronary artery disease, diastolic CHF, PE x2 on warfarin, atrial for ablation, stroke, adenocarcinoma of the GE junction, and hyperlipidemia presenting with numerous complaints of pain including pain of her back, arms, chest, legs, and abdomen.  After extensive interviewing, it appears that her overriding complaint is upper abdominal pain which she has had for the past 1 to 2 weeks.  She feels that food makes it slightly worse as well as sitting up.  She has some nausea but denies any emesis.  She has had some loose stools without any hematochezia.  She claims to have some melena.  The patient denies any fevers, chills, coughing, hemoptysis.  She did have some shortness of breath on the day of admission, but this has improved.  She has had decreased oral intake as a result of her abdominal pain and nausea.  She had also complained of intermittent dysuria.  Evaluation in the emergency department revealed right lower lobe opacity concerning for pneumonia.  Patient was started on ceftriaxone and azithromycin.  She has been afebrile hemodynamically stable with a T-max of 99.0 F.  Oxygen saturation has been 93-95% on RA  Assessment/Plan: Abdominal Pain/Abnormal CT -02/17/18-CT abd/pelvis--edema of the hepatic flexure of the colon--?  Diverticulitis/colitis vs ischemic colitis -increase tramadol to 100mg  q 6 hours prn  -she has numerous medication intolerances -UA neg for pyruia -Continue ceftriaxone, metronidazole was added -GI consult  Pulmonary Opacity/Pleural effusions -clinically not consistent with pneumonia -Likely represents pleural effusion -CT chest--R>L pleural effusion -check procalcitonin = 0.20 -[place] thoracocentesis if pt agreeable  Chest pain with history of CAD -cycle troponins--neg x 3 -personally  reviewed EKG--afib without ST-T changes -atypical be clinical hx -12/20/17 Echo--EF 65-70%, moderate AS, no WMA, mild TR/MR -02/16/2018 echo EF 50-55%, moderate left ear, mild MR/TR, mild AI  Chronic diastolic CHF  -She appears to be clinically euvolemic -Continue furosemide home dose -Daily weights -I's and O's--incompleted collected  Paroxysmal Atrial Fibrillation -continue warfarin -rate controlled  Essential hypertension -Continue carvedilol  Hyperlipidemia -Continue Zetia and simvastatin  GERD -Continue Protonix  History of PE -continue warfarin  Hyperlipidemia -continue Vytorin   Disposition Plan:   Home 8/19 or 8/20 if stable Family Communication:   Son updatded at bedside 02/17/18--Total time spent 35 minutes.  Greater than 50% spent face to face counseling and coordinating care.   Consultants:  none  Code Status:  FULL  DVT Prophylaxis:  coumadin   Procedures: As Listed in Progress Note Above  Antibiotics: Ceftriaxone 8/16>>> Azithromycin 8/16>>>  Subjective: Patient feels that her abdominal pain is 50% better.  She denies any nausea, vomiting, diarrhea.  She complained of some blood when she wiped after bowel movement.  She denies any fevers, chills, headache.  She had some intermittent chest discomfort/epigastric discomfort while sitting in bed.  She feels that her breathing is a little bit more labored today.  Objective: Vitals:   02/16/18 0551 02/16/18 1300 02/16/18 2054 02/17/18 0657  BP: (!) 166/89 (!) 145/65 (!) 142/74 (!) 173/79  Pulse: 86  83 91  Resp: 15 16 20 17   Temp: 99 F (37.2 C) 98.8 F (37.1 C) 99 F (37.2 C) 98.2 F (36.8 C)  TempSrc: Oral Oral Oral Oral  SpO2: 93% 93% 91% 95%  Weight:    53.6 kg  Height:  Intake/Output Summary (Last 24 hours) at 02/17/2018 0834 Last data filed at 02/17/2018 0309 Gross per 24 hour  Intake 1171.67 ml  Output 100 ml  Net 1071.67 ml   Weight change: -0.363  kg Exam:   General:  Pt is alert, follows commands appropriately, not in acute distress  HEENT: No icterus, No thrush, No neck mass, [place]/AT  Cardiovascular: IRRR, S1/S2, no rubs, no gallops  Respiratory: Bibasilar crackles, right greater than left.  No wheezing.  Good air movement  Abdomen: Soft/+BS, non tender, non distended, no guarding  Extremities: No edema, No lymphangitis, No petechiae, No rashes, no synovitis   Data Reviewed: I have personally reviewed following labs and imaging studies Basic Metabolic Panel: Recent Labs  Lab 02/15/18 1504 02/16/18 0644 02/17/18 0640  NA 139 141 139  K 4.2 4.4 3.6  CL 106 107 104  CO2 27 25 26   GLUCOSE 141* 132* 108*  BUN 9 14 16   CREATININE 0.83 0.90 0.91  CALCIUM 8.6* 8.4* 8.2*   Liver Function Tests: Recent Labs  Lab 02/15/18 1504  AST 17  ALT 16  ALKPHOS 79  BILITOT 0.9  PROT 6.5  ALBUMIN 3.6   Recent Labs  Lab 02/15/18 1504  LIPASE 22   No results for input(s): AMMONIA in the last 168 hours. Coagulation Profile: Recent Labs  Lab 02/15/18 1504 02/16/18 0644 02/17/18 0640  INR 1.92 1.84 2.47   CBC: Recent Labs  Lab 02/15/18 1504 02/16/18 0644 02/17/18 0640  WBC 5.3 6.0 4.7  NEUTROABS 3.2  --   --   HGB 12.5 11.9* 11.8*  HCT 37.6 35.6* 35.2*  MCV 87.4 87.9 87.3  PLT 158 157 141*   Cardiac Enzymes: Recent Labs  Lab 02/15/18 1504 02/16/18 1023 02/16/18 1556  CKTOTAL  --  35*  --   TROPONINI <0.03 <0.03 <0.03   BNP: Invalid input(s): POCBNP CBG: No results for input(s): GLUCAP in the last 168 hours. HbA1C: No results for input(s): HGBA1C in the last 72 hours. Urine analysis:    Component Value Date/Time   COLORURINE YELLOW 02/15/2018 [place] 02/15/2018 1549   LABSPEC 1.016 02/15/2018 1549   PHURINE 6.0 02/15/2018 1549   GLUCOSEU NEGATIVE 02/15/2018 1549   HGBUR MODERATE (A) 02/15/2018 1549   BILIRUBINUR NEGATIVE 02/15/2018 1549   BILIRUBINUR n 01/29/2018 1029    KETONESUR NEGATIVE 02/15/2018 1549   PROTEINUR NEGATIVE 02/15/2018 1549   UROBILINOGEN 0.2 01/29/2018 1029   UROBILINOGEN 0.2 05/04/2015 2308   NITRITE NEGATIVE 02/15/2018 1549   LEUKOCYTESUR NEGATIVE 02/15/2018 1549   Sepsis Labs: @LABRCNTIP (procalcitonin:4,lacticidven:4) )No results found for this or any previous visit (from the past 240 hour(s)).   Scheduled Meds: . aspirin EC  81 mg Oral Daily  . carvedilol  25 mg Oral BID WC  . ezetimibe  10 mg Oral q1800   And  . simvastatin  20 mg Oral q1800  . famotidine  20 mg Oral BID  . furosemide  40 mg Oral Daily  . pantoprazole  40 mg Oral BID  . temazepam  15 mg Oral QHS  . Warfarin - Pharmacist Dosing Inpatient   Does not apply q1800   Continuous Infusions: . cefTRIAXone (ROCEPHIN)  IV Stopped (02/16/18 1740)  . metronidazole Stopped (02/17/18 0309)    Procedures/Studies: Ct Abdomen Pelvis Wo Contrast  Result Date: 02/16/2018 CLINICAL DATA:  Upper abdominal pain for 1-2 weeks. Abnormal chest radiograph. Gastroenteritis or colitis suspected. History of coronary artery disease and hypertension. Esophageal cancer. Appendectomy. Cholecystectomy.  EXAM: CT CHEST, ABDOMEN AND PELVIS WITHOUT CONTRAST TECHNIQUE: Multidetector CT imaging of the chest, abdomen and pelvis was performed following the standard protocol without IV contrast. COMPARISON:  Chest radiograph 02/15/2018. Abdominopelvic CT 10/10/2016. Chest CT 05/06/2017. FINDINGS: CT CHEST FINDINGS Cardiovascular: Mild motion degradation throughout. Aortic and branch vessel atherosclerosis. Tortuous thoracic aorta. Mild cardiomegaly with minimal pericardial fluid, likely physiologic. Aortic valve calcification. Multivessel coronary artery atherosclerosis. Mediastinum/Nodes: No supraclavicular adenopathy. No mediastinal or definite hilar adenopathy, given limitations of unenhanced CT. A moderate hiatal hernia Lungs/Pleura: Moderate right and small left pleural effusion. Dependent bibasilar  airspace disease, favored to represent atelectasis. Musculoskeletal: No acute osseous abnormality. Lower cervical spine fixation. CT ABDOMEN PELVIS FINDINGS Hepatobiliary: Suspect a right hepatic lobe cyst, likely present on the prior. 9 mm today. Cholecystectomy, without biliary ductal dilatation. Pancreas: Mild pancreatic atrophy, without duct dilatation. Spleen: Calcified splenic lesion is chronic and likely related to remote infection or trauma. Adrenals/Urinary Tract: Normal adrenal glands. Mild renal cortical thinning bilaterally. 1.5 cm upper pole left renal low-density lesion is likely a cyst. No hydronephrosis. More mild motion degradation continuing into the abdomen and pelvis. No bladder calculi. Stomach/Bowel: Normal distal stomach. Scattered colonic diverticula. Equivocal edema adjacent the hepatic flexure of the colon, including on 64/2. Normal terminal ileum. Appendix not visualized. Normal small bowel. Vascular/Lymphatic: Advanced aortic and branch vessel atherosclerosis. No abdominopelvic adenopathy. Reproductive: Normal uterus and adnexa. Other: Small volume cul-de-sac fluid is new since the prior. Moderate pelvic floor laxity. No evidence of omental or peritoneal disease. Musculoskeletal: Mild osteopenia. Thoracolumbar spondylosis with prior spine spinous process fixation at L4-5. IMPRESSION: 1. Multifactorial degradation, including lack of IV contrast and motion. 2. Bilateral pleural effusions, larger on the right. Dependent bibasilar atelectasis. 3. Moderate hiatal hernia. 4. Coronary artery atherosclerosis. Aortic Atherosclerosis (ICD10-I70.0). Aortic valvular calcifications. Consider echocardiography to evaluate for valvular dysfunction. 5. Equivocal edema adjacent the hepatic flexure of the colon. Cannot exclude mild diverticulitis or less likely colitis. This area is suboptimally evaluated, secondary to above limitations and lack of enteric opacification of the colon. 6. Trace pelvic fluid,  possibly related to fluid overload. Electronically Signed   By: Abigail Miyamoto M.D.   On: 02/16/2018 17:10   Dg Chest 2 View  Result Date: 02/15/2018 CLINICAL DATA:  Hypertension EXAM: CHEST - 2 VIEW COMPARISON:  December 19, 2017 FINDINGS: There is airspace opacity in the lateral right base with small right pleural effusion. There is also a small left pleural effusion. There is consolidation in the medial right base. Lungs elsewhere are clear. Heart is upper normal in size with mild pulmonary venous hypertension. No adenopathy. There is aortic atherosclerosis. There is postoperative change in the lower cervical region. There is degenerative change in the thoracic spine. Bones are osteoporotic. IMPRESSION: Consolidation in the medial and lateral aspects of the right base, felt to represent pneumonia. Small pleural effusions bilaterally. There is a degree of pulmonary vascular congestion. There is aortic atherosclerosis. Bones are osteoporotic. Aortic Atherosclerosis (ICD10-I70.0). Followup PA and lateral chest radiographs recommended in 3-4 weeks following trial of antibiotic therapy to ensure resolution and exclude underlying malignancy. Electronically Signed   By: Lowella Grip III M.D.   On: 02/15/2018 16:08   Ct Chest Wo Contrast  Result Date: 02/16/2018 CLINICAL DATA:  Upper abdominal pain for 1-2 weeks. Abnormal chest radiograph. Gastroenteritis or colitis suspected. History of coronary artery disease and hypertension. Esophageal cancer. Appendectomy. Cholecystectomy. EXAM: CT CHEST, ABDOMEN AND PELVIS WITHOUT CONTRAST TECHNIQUE: Multidetector CT imaging of the chest, abdomen  and pelvis was performed following the standard protocol without IV contrast. COMPARISON:  Chest radiograph 02/15/2018. Abdominopelvic CT 10/10/2016. Chest CT 05/06/2017. FINDINGS: CT CHEST FINDINGS Cardiovascular: Mild motion degradation throughout. Aortic and branch vessel atherosclerosis. Tortuous thoracic aorta. Mild  cardiomegaly with minimal pericardial fluid, likely physiologic. Aortic valve calcification. Multivessel coronary artery atherosclerosis. Mediastinum/Nodes: No supraclavicular adenopathy. No mediastinal or definite hilar adenopathy, given limitations of unenhanced CT. A moderate hiatal hernia Lungs/Pleura: Moderate right and small left pleural effusion. Dependent bibasilar airspace disease, favored to represent atelectasis. Musculoskeletal: No acute osseous abnormality. Lower cervical spine fixation. CT ABDOMEN PELVIS FINDINGS Hepatobiliary: Suspect a right hepatic lobe cyst, likely present on the prior. 9 mm today. Cholecystectomy, without biliary ductal dilatation. Pancreas: Mild pancreatic atrophy, without duct dilatation. Spleen: Calcified splenic lesion is chronic and likely related to remote infection or trauma. Adrenals/Urinary Tract: Normal adrenal glands. Mild renal cortical thinning bilaterally. 1.5 cm upper pole left renal low-density lesion is likely a cyst. No hydronephrosis. More mild motion degradation continuing into the abdomen and pelvis. No bladder calculi. Stomach/Bowel: Normal distal stomach. Scattered colonic diverticula. Equivocal edema adjacent the hepatic flexure of the colon, including on 64/2. Normal terminal ileum. Appendix not visualized. Normal small bowel. Vascular/Lymphatic: Advanced aortic and branch vessel atherosclerosis. No abdominopelvic adenopathy. Reproductive: Normal uterus and adnexa. Other: Small volume cul-de-sac fluid is new since the prior. Moderate pelvic floor laxity. No evidence of omental or peritoneal disease. Musculoskeletal: Mild osteopenia. Thoracolumbar spondylosis with prior spine spinous process fixation at L4-5. IMPRESSION: 1. Multifactorial degradation, including lack of IV contrast and motion. 2. Bilateral pleural effusions, larger on the right. Dependent bibasilar atelectasis. 3. Moderate hiatal hernia. 4. Coronary artery atherosclerosis. Aortic  Atherosclerosis (ICD10-I70.0). Aortic valvular calcifications. Consider echocardiography to evaluate for valvular dysfunction. 5. Equivocal edema adjacent the hepatic flexure of the colon. Cannot exclude mild diverticulitis or less likely colitis. This area is suboptimally evaluated, secondary to above limitations and lack of enteric opacification of the colon. 6. Trace pelvic fluid, possibly related to fluid overload. Electronically Signed   By: Abigail Miyamoto M.D.   On: 02/16/2018 17:10    Orson Eva, DO  [facility] Pager [phone]  If 7PM-7AM, please contact night-coverage [email or web address] Password [facility] 02/17/2018, 8:34 AM   LOS: 0 days

## 2018-02-17 NOTE — Progress Notes (Signed)
ANTICOAGULATION CONSULT NOTE - Follow Up Consult  Pharmacy Consult for warfarin dosing Indication: paroxysmal atrial fibrillation INR today : 2.47 (increased by 0.63 d/t metronidazole interaction)   Patient Measurements: Height: 5\' 2"  (157.5 cm) Weight: 118 lb 3.2 oz (53.6 kg) IBW/kg (Calculated) : 50.1 Heparin Dosing Weight: n/a  Vital Signs: Temp: 98.2 F (36.8 C) (08/18 0657) Temp Source: Oral (08/18 0657) BP: 173/79 (08/18 0657) Pulse Rate: 91 (08/18 0657)  Labs: Recent Labs    02/15/18 1504 02/16/18 0644 02/16/18 1023 02/16/18 1556 02/17/18 0640  HGB 12.5 11.9*  --   --  11.8*  HCT 37.6 35.6*  --   --  35.2*  PLT 158 157  --   --  141*  LABPROT 21.8* 21.1*  --   --  26.6*  INR 1.92 1.84  --   --  2.47  CREATININE 0.83 0.90  --   --  0.91  CKTOTAL  --   --  35*  --   --   TROPONINI <0.03  --  <0.03 <0.03  --     Estimated Creatinine Clearance: 40.3 mL/min (by C-G formula based on SCr of 0.91 mg/dL).  Assessment: INR is  2.47 today, while within goal therapeutic range, the increase was in INR was extremely rapid, probably due to the addition of metronidazole to this patient's drug regimen. Will need to account for this interaction in further dosing adjustment.     Plan:  Give warfarin 2mg  today x1 dose. Monitor for signs and symptoms   Despina Pole, Pharm. D. Clinical Pharmacist 02/17/2018 1:25 PM

## 2018-02-18 ENCOUNTER — Observation Stay (HOSPITAL_COMMUNITY): Payer: Medicare Other

## 2018-02-18 DIAGNOSIS — Z8501 Personal history of malignant neoplasm of esophagus: Secondary | ICD-10-CM | POA: Diagnosis not present

## 2018-02-18 DIAGNOSIS — M1990 Unspecified osteoarthritis, unspecified site: Secondary | ICD-10-CM | POA: Diagnosis present

## 2018-02-18 DIAGNOSIS — Z86711 Personal history of pulmonary embolism: Secondary | ICD-10-CM | POA: Diagnosis not present

## 2018-02-18 DIAGNOSIS — Z87892 Personal history of anaphylaxis: Secondary | ICD-10-CM | POA: Diagnosis not present

## 2018-02-18 DIAGNOSIS — R1013 Epigastric pain: Secondary | ICD-10-CM | POA: Diagnosis not present

## 2018-02-18 DIAGNOSIS — Z9102 Food additives allergy status: Secondary | ICD-10-CM | POA: Diagnosis not present

## 2018-02-18 DIAGNOSIS — I350 Nonrheumatic aortic (valve) stenosis: Secondary | ICD-10-CM | POA: Diagnosis present

## 2018-02-18 DIAGNOSIS — Z9049 Acquired absence of other specified parts of digestive tract: Secondary | ICD-10-CM | POA: Diagnosis not present

## 2018-02-18 DIAGNOSIS — E785 Hyperlipidemia, unspecified: Secondary | ICD-10-CM | POA: Diagnosis present

## 2018-02-18 DIAGNOSIS — Z8673 Personal history of transient ischemic attack (TIA), and cerebral infarction without residual deficits: Secondary | ICD-10-CM | POA: Diagnosis not present

## 2018-02-18 DIAGNOSIS — Z7901 Long term (current) use of anticoagulants: Secondary | ICD-10-CM | POA: Diagnosis not present

## 2018-02-18 DIAGNOSIS — K529 Noninfective gastroenteritis and colitis, unspecified: Secondary | ICD-10-CM | POA: Diagnosis present

## 2018-02-18 DIAGNOSIS — Z91041 Radiographic dye allergy status: Secondary | ICD-10-CM | POA: Diagnosis not present

## 2018-02-18 DIAGNOSIS — E782 Mixed hyperlipidemia: Secondary | ICD-10-CM | POA: Diagnosis not present

## 2018-02-18 DIAGNOSIS — R1084 Generalized abdominal pain: Secondary | ICD-10-CM | POA: Diagnosis not present

## 2018-02-18 DIAGNOSIS — J189 Pneumonia, unspecified organism: Secondary | ICD-10-CM | POA: Diagnosis present

## 2018-02-18 DIAGNOSIS — E739 Lactose intolerance, unspecified: Secondary | ICD-10-CM | POA: Diagnosis present

## 2018-02-18 DIAGNOSIS — I110 Hypertensive heart disease with heart failure: Secondary | ICD-10-CM | POA: Diagnosis present

## 2018-02-18 DIAGNOSIS — Z8249 Family history of ischemic heart disease and other diseases of the circulatory system: Secondary | ICD-10-CM | POA: Diagnosis not present

## 2018-02-18 DIAGNOSIS — Z91018 Allergy to other foods: Secondary | ICD-10-CM | POA: Diagnosis not present

## 2018-02-18 DIAGNOSIS — I480 Paroxysmal atrial fibrillation: Secondary | ICD-10-CM | POA: Diagnosis not present

## 2018-02-18 DIAGNOSIS — I2510 Atherosclerotic heart disease of native coronary artery without angina pectoris: Secondary | ICD-10-CM | POA: Diagnosis present

## 2018-02-18 DIAGNOSIS — I5032 Chronic diastolic (congestive) heart failure: Secondary | ICD-10-CM | POA: Diagnosis not present

## 2018-02-18 DIAGNOSIS — K559 Vascular disorder of intestine, unspecified: Secondary | ICD-10-CM | POA: Diagnosis present

## 2018-02-18 DIAGNOSIS — C160 Malignant neoplasm of cardia: Secondary | ICD-10-CM | POA: Diagnosis not present

## 2018-02-18 DIAGNOSIS — K219 Gastro-esophageal reflux disease without esophagitis: Secondary | ICD-10-CM | POA: Diagnosis present

## 2018-02-18 DIAGNOSIS — Z823 Family history of stroke: Secondary | ICD-10-CM | POA: Diagnosis not present

## 2018-02-18 DIAGNOSIS — J90 Pleural effusion, not elsewhere classified: Secondary | ICD-10-CM | POA: Diagnosis not present

## 2018-02-18 DIAGNOSIS — I10 Essential (primary) hypertension: Secondary | ICD-10-CM | POA: Diagnosis not present

## 2018-02-18 DIAGNOSIS — K5732 Diverticulitis of large intestine without perforation or abscess without bleeding: Secondary | ICD-10-CM | POA: Diagnosis present

## 2018-02-18 DIAGNOSIS — G8929 Other chronic pain: Secondary | ICD-10-CM | POA: Diagnosis present

## 2018-02-18 DIAGNOSIS — D509 Iron deficiency anemia, unspecified: Secondary | ICD-10-CM | POA: Diagnosis present

## 2018-02-18 LAB — COMPREHENSIVE METABOLIC PANEL
ALBUMIN: 3.4 g/dL — AB (ref 3.5–5.0)
ALT: 13 U/L (ref 0–44)
ANION GAP: 11 (ref 5–15)
AST: 16 U/L (ref 15–41)
Alkaline Phosphatase: 67 U/L (ref 38–126)
BILIRUBIN TOTAL: 0.7 mg/dL (ref 0.3–1.2)
BUN: 17 mg/dL (ref 8–23)
CHLORIDE: 99 mmol/L (ref 98–111)
CO2: 30 mmol/L (ref 22–32)
Calcium: 8.4 mg/dL — ABNORMAL LOW (ref 8.9–10.3)
Creatinine, Ser: 1.04 mg/dL — ABNORMAL HIGH (ref 0.44–1.00)
GFR calc Af Amer: 58 mL/min — ABNORMAL LOW (ref >60)
GFR calc non Af Amer: 50 mL/min — ABNORMAL LOW (ref >60)
GLUCOSE: 125 mg/dL — AB (ref 70–99)
POTASSIUM: 3.1 mmol/L — AB (ref 3.5–5.1)
SODIUM: 140 mmol/L (ref 135–145)
TOTAL PROTEIN: 6 g/dL — AB (ref 6.5–8.1)

## 2018-02-18 LAB — CBC
HCT: 38 % (ref 36.0–46.0)
Hemoglobin: 12.8 g/dL (ref 12.0–15.0)
MCH: 29.2 pg (ref 26.0–34.0)
MCHC: 33.7 g/dL (ref 30.0–36.0)
MCV: 86.8 fL (ref 78.0–100.0)
PLATELETS: 152 10*3/uL (ref 150–400)
RBC: 4.38 MIL/uL (ref 3.87–5.11)
RDW: 12.6 % (ref 11.5–15.5)
WBC: 5.1 10*3/uL (ref 4.0–10.5)

## 2018-02-18 LAB — OCCULT BLOOD X 1 CARD TO LAB, STOOL: FECAL OCCULT BLD: POSITIVE — AB

## 2018-02-18 LAB — LACTATE DEHYDROGENASE: LDH: 148 U/L (ref 98–192)

## 2018-02-18 LAB — PROTIME-INR
INR: 2.94
PROTHROMBIN TIME: 30.4 s — AB (ref 11.4–15.2)

## 2018-02-18 MED ORDER — WARFARIN SODIUM 1 MG PO TABS
1.0000 mg | ORAL_TABLET | Freq: Once | ORAL | Status: DC
  Filled 2018-02-18: qty 0.5

## 2018-02-18 MED ORDER — CIPROFLOXACIN HCL 500 MG PO TABS: 500.0000 mg | ORAL_TABLET | Freq: Two times a day (BID) | ORAL | 0 refills | Status: DC

## 2018-02-18 MED ORDER — METRONIDAZOLE 500 MG PO TABS: 500.0000 mg | ORAL_TABLET | Freq: Three times a day (TID) | ORAL | 0 refills | Status: DC

## 2018-02-18 NOTE — Progress Notes (Signed)
Arnette Norris discharged Home per MD order.  Discharge instructions reviewed and discussed with the patient, all questions and concerns answered. Copy of instructions and scripts given to patient.  Allergies as of 02/18/2018      Reactions   Iodinated Diagnostic Agents Anaphylaxis, Other (See Comments)   IPD dye Info given by patient   Ioxaglate Anaphylaxis, Other (See Comments)   Info given by patient   Red Dye Anaphylaxis   Lactose Intolerance (gi)    Whey Other (See Comments)   Lactose intolerance   Darvon [propoxyphene] Rash   Hydralazine Anxiety, Other (See Comments)   Facial flushing, pt prefers not to use it.    Milk-related Compounds Other (See Comments)   Lactose intolerance Can tolerate milk if its cooked into the recipe, just can't drink milk       Medication List    TAKE these medications   acetaminophen 500 MG tablet Commonly known as:  TYLENOL Take 500-1,000 mg by mouth daily as needed for mild pain or moderate pain.   aspirin EC 81 MG tablet Take 1 tablet (81 mg total) by mouth daily.   carvedilol 25 MG tablet Commonly known as:  COREG Take 1 tablet (25 mg total) by mouth 2 (two) times daily with a meal.   ciprofloxacin 500 MG tablet Commonly known as:  CIPRO Take 1 tablet (500 mg total) by mouth 2 (two) times daily.   cyanocobalamin 1000 MCG/ML injection Commonly known as:  (VITAMIN B-12) IM daily 11/29-12/3, then weekly x4, then monthly. What changed:    how much to take  how to take this  when to take this  additional instructions   ezetimibe-simvastatin 10-20 MG tablet Commonly known as:  VYTORIN Take 1 tablet by mouth daily.   ferrous sulfate 325 (65 FE) MG tablet Take 325 mg by mouth every evening.   FLINTSTONES PLUS IRON chewable tablet Chew 2 tablets by mouth daily.   furosemide 40 MG tablet Commonly known as:  LASIX TAKE 1 TABLET BY MOUTH ONCE DAILY.   gabapentin 300 MG capsule Commonly known as:  NEURONTIN TAKE 1 CAPSULE BY  MOUTH THREE TIMES A DAY. What changed:    how much to take  how to take this  when to take this  reasons to take this  additional instructions   meclizine 25 MG tablet Commonly known as:  ANTIVERT Take 25 mg by mouth as needed for dizziness or nausea.   metroNIDAZOLE 500 MG tablet Commonly known as:  FLAGYL Take 1 tablet (500 mg total) by mouth 3 (three) times daily.   pantoprazole 40 MG tablet Commonly known as:  PROTONIX TAKE 1 TABLET BY MOUTH TWICE DAILY.   temazepam 15 MG capsule Commonly known as:  RESTORIL TAKE (1) CAPSULE BY MOUTH AT BEDTIME.   traMADol 50 MG tablet Commonly known as:  ULTRAM TAKE (1) TABLET BY MOUTH EVERY EIGHT HOURS.   warfarin 5 MG tablet Commonly known as:  COUMADIN Take as directed. If you are unsure how to take this medication, talk to your nurse or doctor. Original instructions:  TAKE 1/2 TABLET BY MOUTH ON WEDNESDAY AND 1 TABLET ALL OTHER DAYS OR AS DIRECTED BY ANTICOAGULATION CLINIC. What changed:    how much to take  how to take this  when to take this  additional instructions       Patients skin is clean, dry and intact, no evidence of skin break down. IV site discontinued and catheter remains intact. Site without signs and  symptoms of complications. Dressing and pressure applied.  Patient escorted to car by NT in a wheelchair,  no distress noted upon discharge.  Ralene Muskrat Jesstin Studstill 02/18/2018 3:35 PM

## 2018-02-18 NOTE — Progress Notes (Addendum)
ANTICOAGULATION CONSULT NOTE - Follow Up Consult  Pharmacy Consult for warfarin dosing Indication: paroxysmal atrial fibrillation INR today : 2.94  Patient Measurements: Height: 5\' 2"  (157.5 cm) Weight: 113 lb 12.8 oz (51.6 kg) IBW/kg (Calculated) : 50.1 Heparin Dosing Weight: n/a  Vital Signs: Temp: 98 F (36.7 C) (08/19 0611) Temp Source: Oral (08/19 0611) BP: 163/77 (08/19 0611) Pulse Rate: 89 (08/19 0611)  Labs: Recent Labs    02/15/18 1504 02/16/18 0644 02/16/18 1023 02/16/18 1556 02/17/18 0640 02/18/18 0527  HGB 12.5 11.9*  --   --  11.8* 12.8  HCT 37.6 35.6*  --   --  35.2* 38.0  PLT 158 157  --   --  141* 152  LABPROT 21.8* 21.1*  --   --  26.6* 30.4*  INR 1.92 1.84  --   --  2.47 2.94  CREATININE 0.83 0.90  --   --  0.91 1.04*  CKTOTAL  --   --  35*  --   --   --   TROPONINI <0.03  --  <0.03 <0.03  --   --     Estimated Creatinine Clearance: 35.3 mL/min (A) (by C-G formula based on SCr of 1.04 mg/dL (H)).  Assessment: INR is  2.94 today, while within goal therapeutic range, the increase was in INR was extremely rapid, probably due to the addition of metronidazole to this patient's drug regimen. Will need to account for this interaction in further dosing adjustment. Patinet's home dose if warfarin is 5 mg Sun,Tue,Thu and 2.5 mg ROW    Plan:  Hold coumadin x 1 dose Monitor for signs and symptoms   Margot Ables, PharmD Clinical Pharmacist 02/18/2018 9:02 AM

## 2018-02-18 NOTE — Discharge Summary (Signed)
Physician Discharge Summary  [name]:[id number] DOB: 09/06/1940 DOA: 02/15/2018  PCP: Dorothyann Peng, NP  Admit date: 02/15/2018 Discharge date: 02/18/2018  Admitted From:Home Disposition:  Home  Recommendations for Outpatient Follow-up:  1. Follow up with PCP in 1-2 weeks 2. Please set up outpatient thoracocentesis for patient on right lung after holding coumadin  Discharge Condition: Stable CODE STATUS: FULL Diet recommendation: Heart Healthy   Brief/Interim Summary: 77 year old female with history of aortic stenosis, coronary artery disease, diastolic CHF, PE x2 on warfarin, atrial for ablation, stroke, adenocarcinoma of the GE junction, and hyperlipidemia presenting with numerous complaints of pain including pain of her back, arms, chest, legs, and abdomen. After extensive interviewing, it appears that her overriding complaint is upper abdominal pain which she has had for the past 1 to 2 weeks. She feels that food makes it slightly worse as well as sitting up. She has some nausea but denies any emesis. She has had some loose stools without any hematochezia. She claims to have some melena. The patient denies any fevers, chills, coughing, hemoptysis. She did have some shortness of breath on the day of admission, but this has improved. She has had decreased oral intake as a result of her abdominal pain and nausea. She had also complained of intermittent dysuria. Evaluation in the emergency department revealed right lower lobe opacity concerning for pneumonia. Patient was started on ceftriaxone and azithromycin. She has been afebrile hemodynamically stable with a T-max of 99.0 F. Oxygen saturation has been 93-95% on RA  Discharge Diagnoses:  Abdominal Pain/Abnormal CT/Colitis -02/17/18-CT abd/pelvis--edema of the hepatic flexure of the colon--?  Diverticulitis/colitis vs ischemic colitis -increase tramadol to 100mg  q 6 hours prn  -she has numerous medication  intolerances -UA neg for pyruia -Continue ceftriaxone, metronidazole was added -GI consult appreciated-->recommended CTA of abd/pelvis -pt absolutely refused CTA abd/pelvis due to requirement for IV dye given her hx of "allergy" to IV contrast -GI subsequently recommended outpt follow with her established GI--Dr.Pyrtle -pt's diet was advance and she tolerated with minimal pain and no vomiting  Pulmonary Opacity/Pleural effusions -clinically not consistent with pneumonia -Likely represents pleural effusion -etiology of pleural effusions unclear as pt is clinically euvolemic -CT chest--R>L pleural effusion -check procalcitonin = 0.20 -[place] thoracocentesis was offered but could not be done due to warfarin and therapeutic INR -I discussed options with pt including reversing warfarin with vit K or simply holding a few days then attempt thoracocentesis -pt subsequently decided to defer thoracocentesis as she felt better and no longer dyspneic--she did not want to wait further in the hospital and would prefer thoracocentesis be done as outpt  Chest painwith history of CAD -cycle troponins--neg x 3 -personally reviewed EKG--afib without ST-T changes -atypical be clinical hx -12/20/17 Echo--EF 65-70%, moderate AS, no WMA, mild TR/MR -02/16/2018 echo EF 50-55%, moderate AS, mild MR/TR, mild AI  Chronic diastolic CHF  -She appears to be clinically euvolemic -Continue furosemide home dose -Daily weights--stable -I's and O's--incompleted collected  Paroxysmal Atrial Fibrillation -continue warfarin -rate controlled  Essential hypertension -Continue carvedilol  Hyperlipidemia -Continue Zetia and simvastatin  GERD -Continue Protonix  History of PE -continue warfarin  Hyperlipidemia -continue Vytorin   Discharge Instructions   Allergies as of 02/18/2018      Reactions   Iodinated Diagnostic Agents Anaphylaxis, Other (See Comments)   IPD dye Info given by patient    Ioxaglate Anaphylaxis, Other (See Comments)   Info given by patient   Red Dye Anaphylaxis   Lactose Intolerance (gi)  Whey Other (See Comments)   Lactose intolerance   Darvon [propoxyphene] Rash   Hydralazine Anxiety, Other (See Comments)   Facial flushing, pt prefers not to use it.    Milk-related Compounds Other (See Comments)   Lactose intolerance Can tolerate milk if its cooked into the recipe, just can't drink milk       Medication List    TAKE these medications   acetaminophen 500 MG tablet Commonly known as:  TYLENOL Take 500-1,000 mg by mouth daily as needed for mild pain or moderate pain.   aspirin EC 81 MG tablet Take 1 tablet (81 mg total) by mouth daily.   carvedilol 25 MG tablet Commonly known as:  COREG Take 1 tablet (25 mg total) by mouth 2 (two) times daily with a meal.   ciprofloxacin 500 MG tablet Commonly known as:  CIPRO Take 1 tablet (500 mg total) by mouth 2 (two) times daily.   cyanocobalamin 1000 MCG/ML injection Commonly known as:  (VITAMIN B-12) IM daily 11/29-12/3, then weekly x4, then monthly. What changed:    how much to take  how to take this  when to take this  additional instructions   ezetimibe-simvastatin 10-20 MG tablet Commonly known as:  VYTORIN Take 1 tablet by mouth daily.   ferrous sulfate 325 (65 FE) MG tablet Take 325 mg by mouth every evening.   FLINTSTONES PLUS IRON chewable tablet Chew 2 tablets by mouth daily.   furosemide 40 MG tablet Commonly known as:  LASIX TAKE 1 TABLET BY MOUTH ONCE DAILY.   gabapentin 300 MG capsule Commonly known as:  NEURONTIN TAKE 1 CAPSULE BY MOUTH THREE TIMES A DAY. What changed:    how much to take  how to take this  when to take this  reasons to take this  additional instructions   meclizine 25 MG tablet Commonly known as:  ANTIVERT Take 25 mg by mouth as needed for dizziness or nausea.   metroNIDAZOLE 500 MG tablet Commonly known as:  FLAGYL Take 1 tablet  (500 mg total) by mouth 3 (three) times daily.   pantoprazole 40 MG tablet Commonly known as:  PROTONIX TAKE 1 TABLET BY MOUTH TWICE DAILY.   temazepam 15 MG capsule Commonly known as:  RESTORIL TAKE (1) CAPSULE BY MOUTH AT BEDTIME.   traMADol 50 MG tablet Commonly known as:  ULTRAM TAKE (1) TABLET BY MOUTH EVERY EIGHT HOURS.   warfarin 5 MG tablet Commonly known as:  COUMADIN Take as directed. If you are unsure how to take this medication, talk to your nurse or doctor. Original instructions:  TAKE 1/2 TABLET BY MOUTH ON WEDNESDAY AND 1 TABLET ALL OTHER DAYS OR AS DIRECTED BY ANTICOAGULATION CLINIC. What changed:    how much to take  how to take this  when to take this  additional instructions       Allergies  Allergen Reactions  . Iodinated Diagnostic Agents Anaphylaxis and Other (See Comments)    IPD dye Info given by patient  . Ioxaglate Anaphylaxis and Other (See Comments)    Info given by patient  . Red Dye Anaphylaxis  . Lactose Intolerance (Gi)   . Whey Other (See Comments)    Lactose intolerance  . Darvon [Propoxyphene] Rash  . Hydralazine Anxiety and Other (See Comments)    Facial flushing, pt prefers not to use it.   . Milk-Related Compounds Other (See Comments)    Lactose intolerance Can tolerate milk if its cooked into the recipe, just can't  drink milk     Consultations:  [facility]   Procedures/Studies: Ct Abdomen Pelvis Wo Contrast  Result Date: 02/16/2018 CLINICAL DATA:  Upper abdominal pain for 1-2 weeks. Abnormal chest radiograph. Gastroenteritis or colitis suspected. History of coronary artery disease and hypertension. Esophageal cancer. Appendectomy. Cholecystectomy. EXAM: CT CHEST, ABDOMEN AND PELVIS WITHOUT CONTRAST TECHNIQUE: Multidetector CT imaging of the chest, abdomen and pelvis was performed following the standard protocol without IV contrast. COMPARISON:  Chest radiograph 02/15/2018. Abdominopelvic CT 10/10/2016. Chest CT  05/06/2017. FINDINGS: CT CHEST FINDINGS Cardiovascular: Mild motion degradation throughout. Aortic and branch vessel atherosclerosis. Tortuous thoracic aorta. Mild cardiomegaly with minimal pericardial fluid, likely physiologic. Aortic valve calcification. Multivessel coronary artery atherosclerosis. Mediastinum/Nodes: No supraclavicular adenopathy. No mediastinal or definite hilar adenopathy, given limitations of unenhanced CT. A moderate hiatal hernia Lungs/Pleura: Moderate right and small left pleural effusion. Dependent bibasilar airspace disease, favored to represent atelectasis. Musculoskeletal: No acute osseous abnormality. Lower cervical spine fixation. CT ABDOMEN PELVIS FINDINGS Hepatobiliary: Suspect a right hepatic lobe cyst, likely present on the prior. 9 mm today. Cholecystectomy, without biliary ductal dilatation. Pancreas: Mild pancreatic atrophy, without duct dilatation. Spleen: Calcified splenic lesion is chronic and likely related to remote infection or trauma. Adrenals/Urinary Tract: Normal adrenal glands. Mild renal cortical thinning bilaterally. 1.5 cm upper pole left renal low-density lesion is likely a cyst. No hydronephrosis. More mild motion degradation continuing into the abdomen and pelvis. No bladder calculi. Stomach/Bowel: Normal distal stomach. Scattered colonic diverticula. Equivocal edema adjacent the hepatic flexure of the colon, including on 64/2. Normal terminal ileum. Appendix not visualized. Normal small bowel. Vascular/Lymphatic: Advanced aortic and branch vessel atherosclerosis. No abdominopelvic adenopathy. Reproductive: Normal uterus and adnexa. Other: Small volume cul-de-sac fluid is new since the prior. Moderate pelvic floor laxity. No evidence of omental or peritoneal disease. Musculoskeletal: Mild osteopenia. Thoracolumbar spondylosis with prior spine spinous process fixation at L4-5. IMPRESSION: 1. Multifactorial degradation, including lack of IV contrast and motion. 2.  Bilateral pleural effusions, larger on the right. Dependent bibasilar atelectasis. 3. Moderate hiatal hernia. 4. Coronary artery atherosclerosis. Aortic Atherosclerosis (ICD10-I70.0). Aortic valvular calcifications. Consider echocardiography to evaluate for valvular dysfunction. 5. Equivocal edema adjacent the hepatic flexure of the colon. Cannot exclude mild diverticulitis or less likely colitis. This area is suboptimally evaluated, secondary to above limitations and lack of enteric opacification of the colon. 6. Trace pelvic fluid, possibly related to fluid overload. Electronically Signed   By: Abigail Miyamoto M.D.   On: 02/16/2018 17:10   Dg Chest 2 View  Result Date: 02/15/2018 CLINICAL DATA:  Hypertension EXAM: CHEST - 2 VIEW COMPARISON:  December 19, 2017 FINDINGS: There is airspace opacity in the lateral right base with small right pleural effusion. There is also a small left pleural effusion. There is consolidation in the medial right base. Lungs elsewhere are clear. Heart is upper normal in size with mild pulmonary venous hypertension. No adenopathy. There is aortic atherosclerosis. There is postoperative change in the lower cervical region. There is degenerative change in the thoracic spine. Bones are osteoporotic. IMPRESSION: Consolidation in the medial and lateral aspects of the right base, felt to represent pneumonia. Small pleural effusions bilaterally. There is a degree of pulmonary vascular congestion. There is aortic atherosclerosis. Bones are osteoporotic. Aortic Atherosclerosis (ICD10-I70.0). Followup PA and lateral chest radiographs recommended in 3-4 weeks following trial of antibiotic therapy to ensure resolution and exclude underlying malignancy. Electronically Signed   By: Lowella Grip III M.D.   On: 02/15/2018 16:08   Ct Chest  Wo Contrast  Result Date: 02/16/2018 CLINICAL DATA:  Upper abdominal pain for 1-2 weeks. Abnormal chest radiograph. Gastroenteritis or colitis suspected. History  of coronary artery disease and hypertension. Esophageal cancer. Appendectomy. Cholecystectomy. EXAM: CT CHEST, ABDOMEN AND PELVIS WITHOUT CONTRAST TECHNIQUE: Multidetector CT imaging of the chest, abdomen and pelvis was performed following the standard protocol without IV contrast. COMPARISON:  Chest radiograph 02/15/2018. Abdominopelvic CT 10/10/2016. Chest CT 05/06/2017. FINDINGS: CT CHEST FINDINGS Cardiovascular: Mild motion degradation throughout. Aortic and branch vessel atherosclerosis. Tortuous thoracic aorta. Mild cardiomegaly with minimal pericardial fluid, likely physiologic. Aortic valve calcification. Multivessel coronary artery atherosclerosis. Mediastinum/Nodes: No supraclavicular adenopathy. No mediastinal or definite hilar adenopathy, given limitations of unenhanced CT. A moderate hiatal hernia Lungs/Pleura: Moderate right and small left pleural effusion. Dependent bibasilar airspace disease, favored to represent atelectasis. Musculoskeletal: No acute osseous abnormality. Lower cervical spine fixation. CT ABDOMEN PELVIS FINDINGS Hepatobiliary: Suspect a right hepatic lobe cyst, likely present on the prior. 9 mm today. Cholecystectomy, without biliary ductal dilatation. Pancreas: Mild pancreatic atrophy, without duct dilatation. Spleen: Calcified splenic lesion is chronic and likely related to remote infection or trauma. Adrenals/Urinary Tract: Normal adrenal glands. Mild renal cortical thinning bilaterally. 1.5 cm upper pole left renal low-density lesion is likely a cyst. No hydronephrosis. More mild motion degradation continuing into the abdomen and pelvis. No bladder calculi. Stomach/Bowel: Normal distal stomach. Scattered colonic diverticula. Equivocal edema adjacent the hepatic flexure of the colon, including on 64/2. Normal terminal ileum. Appendix not visualized. Normal small bowel. Vascular/Lymphatic: Advanced aortic and branch vessel atherosclerosis. No abdominopelvic adenopathy.  Reproductive: Normal uterus and adnexa. Other: Small volume cul-de-sac fluid is new since the prior. Moderate pelvic floor laxity. No evidence of omental or peritoneal disease. Musculoskeletal: Mild osteopenia. Thoracolumbar spondylosis with prior spine spinous process fixation at L4-5. IMPRESSION: 1. Multifactorial degradation, including lack of IV contrast and motion. 2. Bilateral pleural effusions, larger on the right. Dependent bibasilar atelectasis. 3. Moderate hiatal hernia. 4. Coronary artery atherosclerosis. Aortic Atherosclerosis (ICD10-I70.0). Aortic valvular calcifications. Consider echocardiography to evaluate for valvular dysfunction. 5. Equivocal edema adjacent the hepatic flexure of the colon. Cannot exclude mild diverticulitis or less likely colitis. This area is suboptimally evaluated, secondary to above limitations and lack of enteric opacification of the colon. 6. Trace pelvic fluid, possibly related to fluid overload. Electronically Signed   By: Abigail Miyamoto M.D.   On: 02/16/2018 17:10        Discharge Exam: Vitals:   02/17/18 2237 02/18/18 0611  BP: (!) 151/69 (!) 163/77  Pulse: 67 89  Resp: 20 20  Temp: 98.5 F (36.9 C) 98 F (36.7 C)  SpO2: 93% 92%   Vitals:   02/17/18 0657 02/17/18 1443 02/17/18 2237 02/18/18 0611  BP: (!) 173/79 (!) 161/81 (!) 151/69 (!) 163/77  Pulse: 91 75 67 89  Resp: 17 16 20 20   Temp: 98.2 F (36.8 C) 97.6 F (36.4 C) 98.5 F (36.9 C) 98 F (36.7 C)  TempSrc: Oral Oral Oral Oral  SpO2: 95% 93% 93% 92%  Weight: 53.6 kg   51.6 kg  Height:        General: Pt is alert, awake, not in acute distress Cardiovascular: RRR, S1/S2 +, no rubs, no gallops Respiratory: bibasilar crackles, no wheeze Abdominal: Soft, NT, ND, bowel sounds + Extremities: no edema, no cyanosis   The results of significant diagnostics from this hospitalization (including imaging, microbiology, ancillary and laboratory) are listed below for reference.     Significant Diagnostic Studies: Ct Abdomen Pelvis  Wo Contrast  Result Date: 02/16/2018 CLINICAL DATA:  Upper abdominal pain for 1-2 weeks. Abnormal chest radiograph. Gastroenteritis or colitis suspected. History of coronary artery disease and hypertension. Esophageal cancer. Appendectomy. Cholecystectomy. EXAM: CT CHEST, ABDOMEN AND PELVIS WITHOUT CONTRAST TECHNIQUE: Multidetector CT imaging of the chest, abdomen and pelvis was performed following the standard protocol without IV contrast. COMPARISON:  Chest radiograph 02/15/2018. Abdominopelvic CT 10/10/2016. Chest CT 05/06/2017. FINDINGS: CT CHEST FINDINGS Cardiovascular: Mild motion degradation throughout. Aortic and branch vessel atherosclerosis. Tortuous thoracic aorta. Mild cardiomegaly with minimal pericardial fluid, likely physiologic. Aortic valve calcification. Multivessel coronary artery atherosclerosis. Mediastinum/Nodes: No supraclavicular adenopathy. No mediastinal or definite hilar adenopathy, given limitations of unenhanced CT. A moderate hiatal hernia Lungs/Pleura: Moderate right and small left pleural effusion. Dependent bibasilar airspace disease, favored to represent atelectasis. Musculoskeletal: No acute osseous abnormality. Lower cervical spine fixation. CT ABDOMEN PELVIS FINDINGS Hepatobiliary: Suspect a right hepatic lobe cyst, likely present on the prior. 9 mm today. Cholecystectomy, without biliary ductal dilatation. Pancreas: Mild pancreatic atrophy, without duct dilatation. Spleen: Calcified splenic lesion is chronic and likely related to remote infection or trauma. Adrenals/Urinary Tract: Normal adrenal glands. Mild renal cortical thinning bilaterally. 1.5 cm upper pole left renal low-density lesion is likely a cyst. No hydronephrosis. More mild motion degradation continuing into the abdomen and pelvis. No bladder calculi. Stomach/Bowel: Normal distal stomach. Scattered colonic diverticula. Equivocal edema adjacent the hepatic  flexure of the colon, including on 64/2. Normal terminal ileum. Appendix not visualized. Normal small bowel. Vascular/Lymphatic: Advanced aortic and branch vessel atherosclerosis. No abdominopelvic adenopathy. Reproductive: Normal uterus and adnexa. Other: Small volume cul-de-sac fluid is new since the prior. Moderate pelvic floor laxity. No evidence of omental or peritoneal disease. Musculoskeletal: Mild osteopenia. Thoracolumbar spondylosis with prior spine spinous process fixation at L4-5. IMPRESSION: 1. Multifactorial degradation, including lack of IV contrast and motion. 2. Bilateral pleural effusions, larger on the right. Dependent bibasilar atelectasis. 3. Moderate hiatal hernia. 4. Coronary artery atherosclerosis. Aortic Atherosclerosis (ICD10-I70.0). Aortic valvular calcifications. Consider echocardiography to evaluate for valvular dysfunction. 5. Equivocal edema adjacent the hepatic flexure of the colon. Cannot exclude mild diverticulitis or less likely colitis. This area is suboptimally evaluated, secondary to above limitations and lack of enteric opacification of the colon. 6. Trace pelvic fluid, possibly related to fluid overload. Electronically Signed   By: Abigail Miyamoto M.D.   On: 02/16/2018 17:10   Dg Chest 2 View  Result Date: 02/15/2018 CLINICAL DATA:  Hypertension EXAM: CHEST - 2 VIEW COMPARISON:  December 19, 2017 FINDINGS: There is airspace opacity in the lateral right base with small right pleural effusion. There is also a small left pleural effusion. There is consolidation in the medial right base. Lungs elsewhere are clear. Heart is upper normal in size with mild pulmonary venous hypertension. No adenopathy. There is aortic atherosclerosis. There is postoperative change in the lower cervical region. There is degenerative change in the thoracic spine. Bones are osteoporotic. IMPRESSION: Consolidation in the medial and lateral aspects of the right base, felt to represent pneumonia. Small pleural  effusions bilaterally. There is a degree of pulmonary vascular congestion. There is aortic atherosclerosis. Bones are osteoporotic. Aortic Atherosclerosis (ICD10-I70.0). Followup PA and lateral chest radiographs recommended in 3-4 weeks following trial of antibiotic therapy to ensure resolution and exclude underlying malignancy. Electronically Signed   By: Lowella Grip III M.D.   On: 02/15/2018 16:08   Ct Chest Wo Contrast  Result Date: 02/16/2018 CLINICAL DATA:  Upper abdominal pain for 1-2 weeks. Abnormal  chest radiograph. Gastroenteritis or colitis suspected. History of coronary artery disease and hypertension. Esophageal cancer. Appendectomy. Cholecystectomy. EXAM: CT CHEST, ABDOMEN AND PELVIS WITHOUT CONTRAST TECHNIQUE: Multidetector CT imaging of the chest, abdomen and pelvis was performed following the standard protocol without IV contrast. COMPARISON:  Chest radiograph 02/15/2018. Abdominopelvic CT 10/10/2016. Chest CT 05/06/2017. FINDINGS: CT CHEST FINDINGS Cardiovascular: Mild motion degradation throughout. Aortic and branch vessel atherosclerosis. Tortuous thoracic aorta. Mild cardiomegaly with minimal pericardial fluid, likely physiologic. Aortic valve calcification. Multivessel coronary artery atherosclerosis. Mediastinum/Nodes: No supraclavicular adenopathy. No mediastinal or definite hilar adenopathy, given limitations of unenhanced CT. A moderate hiatal hernia Lungs/Pleura: Moderate right and small left pleural effusion. Dependent bibasilar airspace disease, favored to represent atelectasis. Musculoskeletal: No acute osseous abnormality. Lower cervical spine fixation. CT ABDOMEN PELVIS FINDINGS Hepatobiliary: Suspect a right hepatic lobe cyst, likely present on the prior. 9 mm today. Cholecystectomy, without biliary ductal dilatation. Pancreas: Mild pancreatic atrophy, without duct dilatation. Spleen: Calcified splenic lesion is chronic and likely related to remote infection or trauma.  Adrenals/Urinary Tract: Normal adrenal glands. Mild renal cortical thinning bilaterally. 1.5 cm upper pole left renal low-density lesion is likely a cyst. No hydronephrosis. More mild motion degradation continuing into the abdomen and pelvis. No bladder calculi. Stomach/Bowel: Normal distal stomach. Scattered colonic diverticula. Equivocal edema adjacent the hepatic flexure of the colon, including on 64/2. Normal terminal ileum. Appendix not visualized. Normal small bowel. Vascular/Lymphatic: Advanced aortic and branch vessel atherosclerosis. No abdominopelvic adenopathy. Reproductive: Normal uterus and adnexa. Other: Small volume cul-de-sac fluid is new since the prior. Moderate pelvic floor laxity. No evidence of omental or peritoneal disease. Musculoskeletal: Mild osteopenia. Thoracolumbar spondylosis with prior spine spinous process fixation at L4-5. IMPRESSION: 1. Multifactorial degradation, including lack of IV contrast and motion. 2. Bilateral pleural effusions, larger on the right. Dependent bibasilar atelectasis. 3. Moderate hiatal hernia. 4. Coronary artery atherosclerosis. Aortic Atherosclerosis (ICD10-I70.0). Aortic valvular calcifications. Consider echocardiography to evaluate for valvular dysfunction. 5. Equivocal edema adjacent the hepatic flexure of the colon. Cannot exclude mild diverticulitis or less likely colitis. This area is suboptimally evaluated, secondary to above limitations and lack of enteric opacification of the colon. 6. Trace pelvic fluid, possibly related to fluid overload. Electronically Signed   By: Abigail Miyamoto M.D.   On: 02/16/2018 17:10     Microbiology: No results found for this or any previous visit (from the past 240 hour(s)).   Labs: Basic Metabolic Panel: Recent Labs  Lab 02/15/18 1504 02/16/18 0644 02/17/18 0640 02/18/18 0527  NA 139 141 139 140  K 4.2 4.4 3.6 3.1*  CL 106 107 104 99  CO2 27 25 26 30   GLUCOSE 141* 132* 108* 125*  BUN 9 14 16 17     CREATININE 0.83 0.90 0.91 1.04*  CALCIUM 8.6* 8.4* 8.2* 8.4*   Liver Function Tests: Recent Labs  Lab 02/15/18 1504 02/18/18 0527  AST 17 16  ALT 16 13  ALKPHOS 79 67  BILITOT 0.9 0.7  PROT 6.5 6.0*  ALBUMIN 3.6 3.4*   Recent Labs  Lab 02/15/18 1504  LIPASE 22   No results for input(s): AMMONIA in the last 168 hours. CBC: Recent Labs  Lab 02/15/18 1504 02/16/18 0644 02/17/18 0640 02/18/18 0527  WBC 5.3 6.0 4.7 5.1  NEUTROABS 3.2  --   --   --   HGB 12.5 11.9* 11.8* 12.8  HCT 37.6 35.6* 35.2* 38.0  MCV 87.4 87.9 87.3 86.8  PLT 158 157 141* 152   Cardiac Enzymes: Recent Labs  Lab 02/15/18 1504 02/16/18 1023 02/16/18 1556  CKTOTAL  --  35*  --   TROPONINI <0.03 <0.03 <0.03   BNP: Invalid input(s): POCBNP CBG: No results for input(s): GLUCAP in the last 168 hours.  Time coordinating discharge:  36 minutes  Signed:  Orson Eva, DO [facility] Pager: [phone] 02/18/2018, 1:57 PM

## 2018-02-18 NOTE — Care Management (Signed)
CM notified by [facility] rep that pt is active with nursing and PT services. CM will cont to follow and ensure services are resumed at DC.

## 2018-02-18 NOTE — Progress Notes (Addendum)
Subjective: Abdominal pain improved from admission. Nausea this morning. Short of breath. Thoracentesis today.   Objective: Vital signs in last 24 hours: Temp:  [97.6 F (36.4 C)-98.5 F (36.9 C)] 98 F (36.7 C) (08/19 0611) Pulse Rate:  [67-89] 89 (08/19 0611) Resp:  [16-20] 20 (08/19 0611) BP: (151-163)/(69-81) 163/77 (08/19 0611) SpO2:  [92 %-93 %] 92 % (08/19 0611) Weight:  [51.6 kg] 51.6 kg (08/19 0611) Last BM Date: 02/17/18 General:   Alert and oriented, pleasant, thin, appears chronically ill  Abdomen:  Bowel sounds present, soft, TTP upper abdomen, no rebound or guarding Neurologic:  Alert and  oriented x4 Psych:  Alert and cooperative. Normal mood and affect.  Intake/Output from previous day: 08/18 0701 - 08/19 0700 In: 4266.7 [P.O.:1200; IV Piggyback:3066.7] Out: -  Intake/Output this shift: No intake/output data recorded.  Lab Results: Recent Labs    02/16/18 0644 02/17/18 0640 02/18/18 0527  WBC 6.0 4.7 5.1  HGB 11.9* 11.8* 12.8  HCT 35.6* 35.2* 38.0  PLT 157 141* 152   BMET Recent Labs    02/16/18 0644 02/17/18 0640 02/18/18 0527  NA 141 139 140  K 4.4 3.6 3.1*  CL 107 104 99  CO2 25 26 30   GLUCOSE 132* 108* 125*  BUN 14 16 17   CREATININE 0.90 0.91 1.04*  CALCIUM 8.4* 8.2* 8.4*   LFT Recent Labs    02/15/18 1504 02/18/18 0527  PROT 6.5 6.0*  ALBUMIN 3.6 3.4*  AST 17 16  ALT 16 13  ALKPHOS 79 67  BILITOT 0.9 0.7  BILIDIR 0.2  --   IBILI 0.7  --    PT/INR Recent Labs    02/17/18 0640 02/18/18 0527  LABPROT 26.6* 30.4*  INR 2.47 2.94     Studies/Results: Ct Abdomen Pelvis Wo Contrast  Result Date: 02/16/2018 CLINICAL DATA:  Upper abdominal pain for 1-2 weeks. Abnormal chest radiograph. Gastroenteritis or colitis suspected. History of coronary artery disease and hypertension. Esophageal cancer. Appendectomy. Cholecystectomy. EXAM: CT CHEST, ABDOMEN AND PELVIS WITHOUT CONTRAST TECHNIQUE: Multidetector CT imaging of the  chest, abdomen and pelvis was performed following the standard protocol without IV contrast. COMPARISON:  Chest radiograph 02/15/2018. Abdominopelvic CT 10/10/2016. Chest CT 05/06/2017. FINDINGS: CT CHEST FINDINGS Cardiovascular: Mild motion degradation throughout. Aortic and branch vessel atherosclerosis. Tortuous thoracic aorta. Mild cardiomegaly with minimal pericardial fluid, likely physiologic. Aortic valve calcification. Multivessel coronary artery atherosclerosis. Mediastinum/Nodes: No supraclavicular adenopathy. No mediastinal or definite hilar adenopathy, given limitations of unenhanced CT. A moderate hiatal hernia Lungs/Pleura: Moderate right and small left pleural effusion. Dependent bibasilar airspace disease, favored to represent atelectasis. Musculoskeletal: No acute osseous abnormality. Lower cervical spine fixation. CT ABDOMEN PELVIS FINDINGS Hepatobiliary: Suspect a right hepatic lobe cyst, likely present on the prior. 9 mm today. Cholecystectomy, without biliary ductal dilatation. Pancreas: Mild pancreatic atrophy, without duct dilatation. Spleen: Calcified splenic lesion is chronic and likely related to remote infection or trauma. Adrenals/Urinary Tract: Normal adrenal glands. Mild renal cortical thinning bilaterally. 1.5 cm upper pole left renal low-density lesion is likely a cyst. No hydronephrosis. More mild motion degradation continuing into the abdomen and pelvis. No bladder calculi. Stomach/Bowel: Normal distal stomach. Scattered colonic diverticula. Equivocal edema adjacent the hepatic flexure of the colon, including on 64/2. Normal terminal ileum. Appendix not visualized. Normal small bowel. Vascular/Lymphatic: Advanced aortic and branch vessel atherosclerosis. No abdominopelvic adenopathy. Reproductive: Normal uterus and adnexa. Other: Small volume cul-de-sac fluid is new since the prior. Moderate pelvic floor laxity. No evidence of omental  or peritoneal disease. Musculoskeletal: Mild  osteopenia. Thoracolumbar spondylosis with prior spine spinous process fixation at L4-5. IMPRESSION: 1. Multifactorial degradation, including lack of IV contrast and motion. 2. Bilateral pleural effusions, larger on the right. Dependent bibasilar atelectasis. 3. Moderate hiatal hernia. 4. Coronary artery atherosclerosis. Aortic Atherosclerosis (ICD10-I70.0). Aortic valvular calcifications. Consider echocardiography to evaluate for valvular dysfunction. 5. Equivocal edema adjacent the hepatic flexure of the colon. Cannot exclude mild diverticulitis or less likely colitis. This area is suboptimally evaluated, secondary to above limitations and lack of enteric opacification of the colon. 6. Trace pelvic fluid, possibly related to fluid overload. Electronically Signed   By: Abigail Miyamoto M.D.   On: 02/16/2018 17:10   Ct Chest Wo Contrast  Result Date: 02/16/2018 CLINICAL DATA:  Upper abdominal pain for 1-2 weeks. Abnormal chest radiograph. Gastroenteritis or colitis suspected. History of coronary artery disease and hypertension. Esophageal cancer. Appendectomy. Cholecystectomy. EXAM: CT CHEST, ABDOMEN AND PELVIS WITHOUT CONTRAST TECHNIQUE: Multidetector CT imaging of the chest, abdomen and pelvis was performed following the standard protocol without IV contrast. COMPARISON:  Chest radiograph 02/15/2018. Abdominopelvic CT 10/10/2016. Chest CT 05/06/2017. FINDINGS: CT CHEST FINDINGS Cardiovascular: Mild motion degradation throughout. Aortic and branch vessel atherosclerosis. Tortuous thoracic aorta. Mild cardiomegaly with minimal pericardial fluid, likely physiologic. Aortic valve calcification. Multivessel coronary artery atherosclerosis. Mediastinum/Nodes: No supraclavicular adenopathy. No mediastinal or definite hilar adenopathy, given limitations of unenhanced CT. A moderate hiatal hernia Lungs/Pleura: Moderate right and small left pleural effusion. Dependent bibasilar airspace disease, favored to represent  atelectasis. Musculoskeletal: No acute osseous abnormality. Lower cervical spine fixation. CT ABDOMEN PELVIS FINDINGS Hepatobiliary: Suspect a right hepatic lobe cyst, likely present on the prior. 9 mm today. Cholecystectomy, without biliary ductal dilatation. Pancreas: Mild pancreatic atrophy, without duct dilatation. Spleen: Calcified splenic lesion is chronic and likely related to remote infection or trauma. Adrenals/Urinary Tract: Normal adrenal glands. Mild renal cortical thinning bilaterally. 1.5 cm upper pole left renal low-density lesion is likely a cyst. No hydronephrosis. More mild motion degradation continuing into the abdomen and pelvis. No bladder calculi. Stomach/Bowel: Normal distal stomach. Scattered colonic diverticula. Equivocal edema adjacent the hepatic flexure of the colon, including on 64/2. Normal terminal ileum. Appendix not visualized. Normal small bowel. Vascular/Lymphatic: Advanced aortic and branch vessel atherosclerosis. No abdominopelvic adenopathy. Reproductive: Normal uterus and adnexa. Other: Small volume cul-de-sac fluid is new since the prior. Moderate pelvic floor laxity. No evidence of omental or peritoneal disease. Musculoskeletal: Mild osteopenia. Thoracolumbar spondylosis with prior spine spinous process fixation at L4-5. IMPRESSION: 1. Multifactorial degradation, including lack of IV contrast and motion. 2. Bilateral pleural effusions, larger on the right. Dependent bibasilar atelectasis. 3. Moderate hiatal hernia. 4. Coronary artery atherosclerosis. Aortic Atherosclerosis (ICD10-I70.0). Aortic valvular calcifications. Consider echocardiography to evaluate for valvular dysfunction. 5. Equivocal edema adjacent the hepatic flexure of the colon. Cannot exclude mild diverticulitis or less likely colitis. This area is suboptimally evaluated, secondary to above limitations and lack of enteric opacification of the colon. 6. Trace pelvic fluid, possibly related to fluid overload.  Electronically Signed   By: Abigail Miyamoto M.D.   On: 02/16/2018 17:10    Assessment: 77 year old female with multiple comorbidities presenting with possible pneumonia/pleural effusion and abdominal pain.    Abdominal pain: differential includes including chronic mesenteric ischemia. Unable to assess mesenteric vasculature adequately on non-contrast CT. Prior anaphylaxis with IV dye and would need pre-medication protocol vs different imaging. Patient is declining any imaging with IV dye. Clinically improved from admission. Will await thoracentesis and assess again in  the morning. Mesenteric duplex not ideal for assessing vasculature. If continues to improve, further evaluation with outpatient primary GI appropriate ([facility]).   Abnormal CT: equivocal edema adjacent to hepatic flexure, doubt diverticulitis or colitis. Could possibly be artifactual in nature.   Thoracentesis today.    Plan: Thoracentesis today Declining any imaging with IV dye due to past history If continues to improve, further outpatient evaluation with primary GI.  Protonix BID   Annitta Needs, PhD, ANP-BC [facility]     LOS: 0 days    02/18/2018, 8:23 AM

## 2018-02-18 NOTE — Care Management (Signed)
DC home today. [facility] rep, aware of DC and will pull orders to resume services. Pt has changed to inpt prior to DC.

## 2018-02-19 ENCOUNTER — Telehealth: Payer: Self-pay | Admitting: Adult Health

## 2018-02-19 ENCOUNTER — Telehealth: Payer: Self-pay | Admitting: Internal Medicine

## 2018-02-19 ENCOUNTER — Telehealth: Payer: Self-pay

## 2018-02-19 ENCOUNTER — Other Ambulatory Visit: Payer: Self-pay | Admitting: Adult Health

## 2018-02-19 DIAGNOSIS — I951 Orthostatic hypotension: Secondary | ICD-10-CM | POA: Diagnosis not present

## 2018-02-19 DIAGNOSIS — I110 Hypertensive heart disease with heart failure: Secondary | ICD-10-CM | POA: Diagnosis not present

## 2018-02-19 DIAGNOSIS — I480 Paroxysmal atrial fibrillation: Secondary | ICD-10-CM | POA: Diagnosis not present

## 2018-02-19 DIAGNOSIS — I5032 Chronic diastolic (congestive) heart failure: Secondary | ICD-10-CM | POA: Diagnosis not present

## 2018-02-19 DIAGNOSIS — R079 Chest pain, unspecified: Secondary | ICD-10-CM | POA: Diagnosis not present

## 2018-02-19 DIAGNOSIS — I2510 Atherosclerotic heart disease of native coronary artery without angina pectoris: Secondary | ICD-10-CM | POA: Diagnosis not present

## 2018-02-19 MED ORDER — ONDANSETRON HCL 4 MG PO TABS: 4.0000 mg | ORAL_TABLET | Freq: Three times a day (TID) | ORAL | 0 refills | Status: DC | PRN

## 2018-02-19 NOTE — Telephone Encounter (Signed)
Okay for verbal orders. 

## 2018-02-19 NOTE — Telephone Encounter (Signed)
Copied from [place] [id number]. Topic: Quick Communication - See Telephone Encounter >> Feb 19, 2018  1:34 PM Rutherford Nail, [place] wrote: CRM for notification. See Telephone encounter for: 02/19/18. Maria with [facility] calling to see if that patient can get something to take for nausea. Please advise. [place] CB#: [phone]

## 2018-02-19 NOTE — Telephone Encounter (Signed)
Ok for verbal orders ?

## 2018-02-19 NOTE — Telephone Encounter (Signed)
No note needed 

## 2018-02-19 NOTE — Telephone Encounter (Signed)
[name] notified of instructions and verbalized understanding.

## 2018-02-19 NOTE — Telephone Encounter (Signed)
Copied from [place] [id number]. Topic: General - Other >> Feb 19, 2018  9:21 AM Carolyn Stare wrote:  Pt call to ask if Tommi Rumps will fill PROMETHAZINE she said she had this RX when she was in [place]

## 2018-02-19 NOTE — Telephone Encounter (Signed)
Verbal orders given as directed. 

## 2018-02-19 NOTE — Telephone Encounter (Signed)
Copied from [place] [id number]. Topic: Quick Communication - See Telephone Encounter >> Feb 19, 2018  3:53 PM Reyne Dumas L wrote: CRM for notification. See Telephone encounter for: 02/19/18.  Amy from [facility] calling for verbal orders: PT 2 week 3 to work on general strengthening for gait and balance Amy can be reached at [phone] - OK to leave a message.

## 2018-02-19 NOTE — Telephone Encounter (Signed)
I have sent in Zofran 4 mg to take. She sent a message earlier wanting Phenergan. I do not feel comfortable sending in phenergan due to the side effect profile

## 2018-02-19 NOTE — Telephone Encounter (Signed)
I see a promethazine injection in history but no prescription. 05/05/15  Please advise.

## 2018-02-19 NOTE — Telephone Encounter (Signed)
Copied from [place] [id number]. Topic: General - Other >> Feb 19, 2018  1:31 PM Keene Breath wrote: Reason for CRM: Verdis Frederickson with [facility] called to inform doctor that patient has gone home from hospital and needs resumption of nursing assessment.  CB# [phone].  Please advise.

## 2018-02-20 ENCOUNTER — Telehealth: Payer: Self-pay | Admitting: *Deleted

## 2018-02-20 NOTE — Telephone Encounter (Signed)
Transition Care Management Follow-up Telephone Call  Per Discharge Summary: Admit date: 02/15/2018 Discharge date: 02/18/2018  Admitted From:Home Disposition:  Home  Recommendations for Outpatient Follow-up:  1. Follow up with PCP in 1-2 weeks 2. Please set up outpatient thoracocentesis for patient on right lung after holding coumadin  Discharge Condition: Stable CODE STATUS: FULL Diet recommendation: Heart Healthy  --   How have you been since you were released from the hospital? "Not well. I had the worst day yesterday. I was nauseated all day long. Today has been better, but I still am feeling nauseous. I think it's the antibiotic. I think it's too much for me." Patient has not tried Zofran for nausea because it has not been delivered to her yet. [facility] and they will deliver to patient today. She has been able to tolerate the cipro, but had to hold metronidazole for now due to severe nausea. She said that it makes no difference whether or not she takes med with food.   Do you understand why you were in the hospital? yes   Do you understand the discharge instructions? yes   Where were you discharged to? Home   Items Reviewed:  Medications reviewed: yes  Allergies reviewed: yes  Dietary changes reviewed: yes  Referrals reviewed: yes, colonoscopy w/ Dr. Hilarie Fredrickson; [facility] nursing and PT   Functional Questionnaire:   Activities of Daily Living (ADLs):   She states they are independent in the following: ambulation, feeding, continence, grooming, toileting and dressing States they require assistance with the following: bathing and hygiene; son assists w/ cooking and hygiene PRN   Any transportation issues/concerns?: no   Any patient concerns? no   Confirmed importance and date/time of follow-up visits scheduled no, patient has appt scheduled for 8/29 but would like appt before the end of the week if possible. Will discuss possible  appt times with provider.   Provider Appointment booked with Dorothyann Peng, NP 02/21/18 @ 11:00am  Confirmed with patient if condition begins to worsen call PCP or go to the ER.  Patient was given the office number and encouraged to call back with question or concerns.  : yes

## 2018-02-20 NOTE — Telephone Encounter (Signed)
Zofran sent to pharmacy by PCP in another encounter.

## 2018-02-20 NOTE — Telephone Encounter (Signed)
Verbal orders given as directed. 

## 2018-02-21 ENCOUNTER — Telehealth: Payer: Self-pay | Admitting: Internal Medicine

## 2018-02-21 ENCOUNTER — Encounter: Payer: Self-pay | Admitting: Adult Health

## 2018-02-21 ENCOUNTER — Ambulatory Visit (INDEPENDENT_AMBULATORY_CARE_PROVIDER_SITE_OTHER): Payer: Medicare Other | Admitting: Adult Health

## 2018-02-21 VITALS — BP 138/62 | HR 80 | Temp 97.8°F | Wt 114.4 lb

## 2018-02-21 DIAGNOSIS — K5792 Diverticulitis of intestine, part unspecified, without perforation or abscess without bleeding: Secondary | ICD-10-CM | POA: Diagnosis not present

## 2018-02-21 DIAGNOSIS — R197 Diarrhea, unspecified: Secondary | ICD-10-CM

## 2018-02-21 DIAGNOSIS — E1165 Type 2 diabetes mellitus with hyperglycemia: Secondary | ICD-10-CM

## 2018-02-21 DIAGNOSIS — R110 Nausea: Secondary | ICD-10-CM | POA: Diagnosis not present

## 2018-02-21 DIAGNOSIS — E538 Deficiency of other specified B group vitamins: Secondary | ICD-10-CM

## 2018-02-21 DIAGNOSIS — I480 Paroxysmal atrial fibrillation: Secondary | ICD-10-CM | POA: Diagnosis not present

## 2018-02-21 DIAGNOSIS — I951 Orthostatic hypotension: Secondary | ICD-10-CM | POA: Diagnosis not present

## 2018-02-21 DIAGNOSIS — J90 Pleural effusion, not elsewhere classified: Secondary | ICD-10-CM | POA: Diagnosis not present

## 2018-02-21 DIAGNOSIS — I2510 Atherosclerotic heart disease of native coronary artery without angina pectoris: Secondary | ICD-10-CM | POA: Diagnosis not present

## 2018-02-21 DIAGNOSIS — I5032 Chronic diastolic (congestive) heart failure: Secondary | ICD-10-CM | POA: Diagnosis not present

## 2018-02-21 DIAGNOSIS — I110 Hypertensive heart disease with heart failure: Secondary | ICD-10-CM | POA: Diagnosis not present

## 2018-02-21 DIAGNOSIS — R079 Chest pain, unspecified: Secondary | ICD-10-CM | POA: Diagnosis not present

## 2018-02-21 LAB — CBC WITH DIFFERENTIAL/PLATELET
BASOS ABS: 0 10*3/uL (ref 0.0–0.1)
Basophils Relative: 0.5 % (ref 0.0–3.0)
EOS ABS: 0.2 10*3/uL (ref 0.0–0.7)
Eosinophils Relative: 2.9 % (ref 0.0–5.0)
HEMATOCRIT: 39.1 % (ref 36.0–46.0)
HEMOGLOBIN: 13.4 g/dL (ref 12.0–15.0)
LYMPHS PCT: 28.9 % (ref 12.0–46.0)
Lymphs Abs: 1.6 10*3/uL (ref 0.7–4.0)
MCHC: 34.2 g/dL (ref 30.0–36.0)
MCV: 86.6 fl (ref 78.0–100.0)
MONOS PCT: 10.2 % (ref 3.0–12.0)
Monocytes Absolute: 0.6 10*3/uL (ref 0.1–1.0)
NEUTROS ABS: 3.2 10*3/uL (ref 1.4–7.7)
Neutrophils Relative %: 57.5 % (ref 43.0–77.0)
PLATELETS: 171 10*3/uL (ref 150.0–400.0)
RBC: 4.52 Mil/uL (ref 3.87–5.11)
RDW: 13.3 % (ref 11.5–15.5)
WBC: 5.6 10*3/uL (ref 4.0–10.5)

## 2018-02-21 LAB — BASIC METABOLIC PANEL
BUN: 12 mg/dL (ref 6–23)
CALCIUM: 9.3 mg/dL (ref 8.4–10.5)
CHLORIDE: 100 meq/L (ref 96–112)
CO2: 36 meq/L — AB (ref 19–32)
CREATININE: 0.95 mg/dL (ref 0.40–1.20)
GFR: 60.39 mL/min (ref >60.00)
Glucose, Bld: 140 mg/dL — ABNORMAL HIGH (ref 70–99)
Potassium: 3.7 mEq/L (ref 3.5–5.1)
SODIUM: 142 meq/L (ref 135–145)

## 2018-02-21 MED ORDER — PROMETHAZINE HCL 12.5 MG PO TABS: 12.5000 mg | ORAL_TABLET | Freq: Three times a day (TID) | ORAL | 0 refills | Status: DC | PRN

## 2018-02-21 MED ORDER — CYANOCOBALAMIN 1000 MCG/ML IJ SOLN
1000.0000 ug | Freq: Once | INTRAMUSCULAR | Status: AC
  Administered 2018-02-21: 1000 ug via INTRAMUSCULAR

## 2018-02-21 NOTE — Progress Notes (Signed)
Subjective:    Patient ID: [name], female    DOB: 06-Dec-1940, 77 y.o.   MRN: [id number]  HPI 77 year old female who  has a past medical history of Anemia (2005), Aortic stenosis, Arthritis, Broken back (2013), CAD (coronary artery disease), CHF (congestive heart failure) ([place]), Chronic headache, Contrast media allergy, Diastolic heart failure ([place]), Esophageal cancer ([place]) (05/06/2015), Facial paresthesia, GERD (gastroesophageal reflux disease), H/O iron deficiency, HH (hiatus hernia) (2008), Hypertension, Paroxysmal atrial fibrillation ([place]), PONV (postoperative nausea and vomiting), Pulmonary emboli ([place]) (2008, 2012), and Stroke [facility]) (1982).   She presents to the office today for TCM visit   Admit Date: 02/15/2018  Discharge Date: 02/18/2018   She originally presented to the emergency room with numerous complaints of pain including pain of her arms, back, chest, legs, and abdomen.  Her overriding complaint was that of upper abdominal pain which she did have for the past 1 to 2 weeks prior to admission.  She reported that she felt as though food makes her symptoms slightly worse as well as sitting up.  She did endorse some nausea but denied any vomiting.  Also endorsed some loose stools and claims to have some melena.  She denies any fevers, chills, coughing, bloody sputum.  She did have shortness of breath on the day of admission but this had improved.  Evaluation in the emergency department revealed right lower lobe opacity concerning for pneumonia.  She was started on ceftriaxone and azithromycin  Hospital Course   Abdominal Pain -She had a CT of the abdomen pelvis done which showed edema of hepatic flexure of the colon.  Mild diverticulitis or less likely colitis could not be ruled out it was recommended that she have a contrast CT abdomen pelvis done but she refused due to iodine allergy.  GI subsequently recommended outpatient follow-up with her established GI, Dr. Hilarie Fredrickson.  The  hospital her diet was advanced and she tolerated with minimal pain and no vomiting.  She was discharged on Cipro and Flagyl course  Pulmonary Opacity  -CT of chest without contrast showed bilateral pleural effusions larger on the right ultrasound thoracentesis was offered but cannot be done due to warfarin and therapeutic INR.  Hospital team discussed options with patient including reversing warfarin with vitamin K or simply holding a few days and then attempting thoracentesis.  She subsequently decided to defer thoracentesis that she felt better no longer dyspneic, she did not want to wait further in the hospital would prefer thoracentesis be done as outpatient  Chest Pain with history of CAD  -Troponins were negative x3 -EKG showed A. fib without ST changes -Echo was done on 02/16/2018 which showed a EF of 50 to 55%, moderate left ear, mild MR/TR, mild AI -Echo was done on 12/20/2017 showed EF 65 to 70%, moderate left ear, no WMA, mild TR/MR   Today in the office she reports " I am doing terrible".  She continues to have nausea constant diarrhea.  She reports her symptoms are worse after taking her antibiotics.  She was not able to take her antibiotics yesterday due to uncontrollable nausea.  I had sent in Zofran 4 mg for her yesterday she has been using this as directed but has not noticed much of a change in nausea.  Denies any blood in her stool but does report that this turned yellow.  She does not think she has a different odor.  He has not contacted Dr. Vena Rua office yet for follow-up appointment  She does also report occasional shortness of breath this is not necessarily with exertion although has occasional episode of pressure midsternally.  This does not radiate and denies any pain.  She would like to have her B12 injection while she is here today   Review of Systems  Constitutional: Positive for activity change, appetite change and fatigue.  HENT: Negative.   Respiratory: Positive  for shortness of breath.   Cardiovascular: Negative for chest pain, palpitations and leg swelling.       Chest pressure   Gastrointestinal: Positive for diarrhea and nausea. Negative for abdominal pain, blood in stool and vomiting.  Endocrine: Negative.   Genitourinary: Negative.   Skin: Negative.   Neurological: Negative.   Psychiatric/Behavioral: Negative.      Past Medical History:  Diagnosis Date  . Anemia 2005   Generally microcytic, transfusions in [address], 2012, 02/2015, 05/2015  . Aortic stenosis    Moderate November 2017  . Arthritis   . Broken back 2013   Chronic back pain.   [name] CAD (coronary artery disease)    Cardiac catheterization 2014 - 80% mid RCA and 70% OM managed medically  . CHF (congestive heart failure) ([place])   . Chronic headache   . Contrast media allergy   . Diastolic heart failure ([place])   . Esophageal cancer ([place]) 05/06/2015   Adenocarcinoma GE junction  . Facial paresthesia   . GERD (gastroesophageal reflux disease)   . H/O iron deficiency    05-06-15 iron infusion ([facility])  . HH (hiatus hernia) 2008   Large with associated erosions  . Hypertension   . Paroxysmal atrial fibrillation ([name])   . PONV (postoperative nausea and vomiting)   . Pulmonary emboli ([place]) 2008, 2012  . Stroke [facility]) 1982    Social History   Socioeconomic History  . Marital status: Divorced    Spouse name: Not on file  . Number of children: 3  . Years of education: 49  . Highest education level: Not on file  Occupational History  . Occupation: Retired  Scientific laboratory technician  . Financial resource strain: Not on file  . Food insecurity:    Worry: Not on file    Inability: Not on file  . Transportation needs:    Medical: Not on file    Non-medical: Not on file  Tobacco Use  . Smoking status: Never Smoker  . Smokeless tobacco: Never Used  Substance and Sexual Activity  . Alcohol use: No    Alcohol/week: 0.0 standard drinks  . Drug use: No  . Sexual activity: Not Currently    Lifestyle  . Physical activity:    Days per week: Not on file    Minutes per session: Not on file  . Stress: Not on file  Relationships  . Social connections:    Talks on phone: Not on file    Gets together: Not on file    Attends religious service: Not on file    Active member of club or organization: Not on file    Attends meetings of clubs or organizations: Not on file    Relationship status: Not on file  . Intimate partner violence:    Fear of current or ex partner: Not on file    Emotionally abused: Not on file    Physically abused: Not on file    Forced sexual activity: Not on file  Other Topics Concern  . Not on file  Social History Narrative   Born and raised in [place], [place]. Currently  resides in a house with her son. 1 dog. Fun: go to church   Divorced(Has total of #3 children)-[place], [place], [place]   Denies religious beliefs that would effect health care.    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[employer]    Past Surgical History:  Procedure Laterality Date  . APPENDECTOMY  1950s  . BACK SURGERY    . CARPAL TUNNEL RELEASE Bilateral 1990s  . [place] SURGERY  2014  . CHOLECYSTECTOMY  1980s   open  . COLONOSCOPY N/A 02/17/2015   Procedure: COLONOSCOPY;  Surgeon: [name], MD;  Location: [facility] ENDOSCOPY;  Service: Endoscopy;  Laterality: N/A;  . ESOPHAGOGASTRODUODENOSCOPY N/A 02/16/2015   Procedure: ESOPHAGOGASTRODUODENOSCOPY (EGD);  Surgeon: [name], MD;  Location: [facility] ENDOSCOPY;  Service: Endoscopy;  Laterality: N/A;  . ESOPHAGOGASTRODUODENOSCOPY N/A 05/06/2015   Procedure: ESOPHAGOGASTRODUODENOSCOPY (EGD);  Surgeon: [name], MD;  Location: [facility] ENDOSCOPY;  Service: Endoscopy;  Laterality: N/A;  . EUS N/A 05/20/2015   Procedure: UPPER ENDOSCOPIC ULTRASOUND (EUS) LINEAR;  Surgeon: [name], MD;  Location: [facility] ENDOSCOPY;  Service: Endoscopy;  Laterality: N/A;  .  exploratory lab  1950s or 1960s  . GIVENS CAPSULE STUDY N/A 05/06/2015   Procedure: GIVENS CAPSULE STUDY;  Surgeon: [name], MD;  Location: [facility] ENDOSCOPY;  Service: Endoscopy;  Laterality: N/A;  . LEFT HEART CATH AND CORONARY ANGIOGRAPHY N/A 11/08/2016   Procedure: Left Heart Cath and Coronary Angiography;  Surgeon: [name], MD;  Location: [facility] CV LAB;  Service: Cardiovascular;  Laterality: N/A;  . lumbar back surgery  2012  . [place] SURGERY  1991  . TONSILLECTOMY    . TONSILLECTOMY AND ADENOIDECTOMY  1960s    Family History  Problem Relation Age of Onset  . Stroke Mother   . Heart disease Mother   . Emphysema Father   . Ovarian cancer Sister   . Stroke Sister   . Other Child        died at birth    Allergies  Allergen Reactions  . Iodinated Diagnostic Agents Anaphylaxis and Other (See Comments)    IPD dye Info given by patient  . Ioxaglate Anaphylaxis and Other (See Comments)    Info given by patient  . Red Dye Anaphylaxis  . Lactose Intolerance ([facility])   . Whey Other (See Comments)    Lactose intolerance  . Darvon [Propoxyphene] Rash  . Hydralazine Anxiety and Other (See Comments)    Facial flushing, pt prefers not to use it.   . Milk-Related Compounds Other (See Comments)    Lactose intolerance Can tolerate milk if its cooked into the recipe, just can't drink milk     Current Outpatient Medications on File Prior to Visit  Medication Sig Dispense Refill  . acetaminophen (TYLENOL) 500 MG tablet Take 500-1,000 mg by mouth daily as needed for mild pain or moderate pain.     [name] aspirin EC 81 MG tablet Take 1 tablet (81 mg total) by mouth daily. 90 tablet 3  . carvedilol (COREG) 25 MG tablet Take 1 tablet (25 mg total) by mouth 2 (two) times daily with a meal. 60 tablet 0  . ciprofloxacin (CIPRO) 500 MG tablet Take 1 tablet (500 mg total) by mouth 2 (two) times daily. 14 tablet 0  . ezetimibe-simvastatin (VYTORIN) 10-20 MG tablet Take 1 tablet by mouth  daily. 30 tablet 6  . ferrous sulfate 325 (65 FE) MG tablet  Take 325 mg by mouth every evening.     . furosemide (LASIX) 40 MG tablet TAKE 1 TABLET BY MOUTH ONCE DAILY. 90 tablet 1  . gabapentin (NEURONTIN) 300 MG capsule TAKE 1 CAPSULE BY MOUTH THREE TIMES A DAY. (Patient taking differently: Take 300 mg by mouth daily as needed (for pain). PRESCRIBED 1 CAPSULE BY MOUTH THREE TIMES A DAY.) 270 capsule 1  . meclizine (ANTIVERT) 25 MG tablet Take 25 mg by mouth as needed for dizziness or nausea.     . ondansetron (ZOFRAN) 4 MG tablet Take 1 tablet (4 mg total) by mouth every 8 (eight) hours as needed for nausea or vomiting. 20 tablet 0  . pantoprazole (PROTONIX) 40 MG tablet TAKE 1 TABLET BY MOUTH TWICE DAILY. 180 tablet 2  . Pediatric Multivitamins-Iron (FLINTSTONES PLUS IRON) chewable tablet Chew 2 tablets by mouth daily. 60 tablet 0  . temazepam (RESTORIL) 15 MG capsule TAKE (1) CAPSULE BY MOUTH AT BEDTIME. 30 capsule 0  . traMADol (ULTRAM) 50 MG tablet TAKE (1) TABLET BY MOUTH EVERY EIGHT HOURS. 90 tablet 0  . warfarin (COUMADIN) 5 MG tablet TAKE 1/2 TABLET BY MOUTH ON WEDNESDAY AND 1 TABLET ALL OTHER DAYS OR AS DIRECTED BY ANTICOAGULATION CLINIC. (Patient taking differently: Take 2.5-5 mg by mouth See admin instructions. Sunday, Thursday, and Sat take a whole tablet. Rest of week take a 1/2 tablet.) 90 tablet 1  . cyanocobalamin (,VITAMIN B-12,) 1000 MCG/ML injection IM daily 11/29-12/3, then weekly x4, then monthly. (Patient not taking: Reported on 02/20/2018) 9 mL 0  . metroNIDAZOLE (FLAGYL) 500 MG tablet Take 1 tablet (500 mg total) by mouth 3 (three) times daily. (Patient not taking: Reported on 02/20/2018) 21 tablet 0   No current facility-administered medications on file prior to visit.     BP 138/62 (BP Location: Right Arm, Patient Position: Sitting, Cuff Size: Normal)   Pulse 80   Temp 97.8 F (36.6 C) (Oral)   Wt 114 lb 6 oz (51.9 kg)   SpO2 95%   BMI 20.92 kg/m       Objective:    Physical Exam  Constitutional: She is oriented to person, place, and time. She appears well-developed and well-nourished. No distress.  Tearful at times   Eyes: Pupils are equal, round, and reactive to light. Conjunctivae and EOM are normal. Right eye exhibits no discharge. Left eye exhibits no discharge. No scleral icterus.  Neck: Normal range of motion. Neck supple.  Cardiovascular: Normal rate, regular rhythm, normal heart sounds and intact distal pulses. Exam reveals no gallop and no friction rub.  No murmur heard. Pulmonary/Chest: Effort normal and breath sounds normal. No stridor. No respiratory distress. She has no wheezes. She has no rales. She exhibits no tenderness.  Abdominal: Soft. Bowel sounds are normal. She exhibits no distension and no mass. There is no tenderness. There is no rebound and no guarding. No hernia.  Musculoskeletal: Normal range of motion. She exhibits no edema, tenderness or deformity.  Neurological: She is alert and oriented to person, place, and time. She displays normal reflexes. No cranial nerve deficit or sensory deficit. She exhibits normal muscle tone. Coordination normal.  Skin: Capillary refill takes less than 2 seconds. She is not diaphoretic.  Psychiatric: She has a normal mood and affect. Her behavior is normal. Judgment and thought content normal.  Nursing note and vitals reviewed.     Assessment & Plan:  1. Pleural effusion, bilateral -Reviewed labs, imaging, and admission notes in detail.  All  patient questions were answered about recent hospital admission.  Will refer to pulmonary for thoracocentesis. - Ambulatory referral to Pulmonology  2. Diverticulitis -Urged to continue with antibiotics until finished, if she is able.  Send in Phenergan to take as needed for worsening nausea.  She will call Dr. Vena Rua office today to schedule follow-up appointment She was advised to follow-up early next week if no improvement  3. Diarrhea, unspecified  type  - Stool culture - Basic Metabolic Panel - CBC with Differential/Platelet  4. Nausea  - promethazine (PHENERGAN) 12.5 MG tablet; Take 1 tablet (12.5 mg total) by mouth every 8 (eight) hours as needed for nausea or vomiting.  Dispense: 20 tablet; Refill: 0  5. B12 deficiency  - cyanocobalamin ((VITAMIN B-12)) injection 1,000 mcg  Dorothyann Peng, NP

## 2018-02-21 NOTE — Patient Instructions (Addendum)
It was great seeing you today   Please call Dr. Vena Rua office to schedule an appointment   I have sent in some Phenergan for nausea. Do your best to finish abx   I will follow up with you regarding your labs   Someone will call you to schedule thoracocentesis   Let me know if you are not feeling any better by early next week

## 2018-02-21 NOTE — Telephone Encounter (Signed)
Patient calling to state per her ASV notes from pcp [facility]; today 8.22.19, she needed to schedule an appt with Dr.Pyrtle. Patient only saw Dr.Pyrtle once in 2016 but she states he was the one who found that she had cancer. Patient has since seen [facility] and [facility] numerous times. Patient wanting to return to Dr.Pyrtle due to both places are too far away and she is elderly. All records from [facility] and [facility] are in [place] and care everywhere from 2016 to 2018. Does Dr.Pyrtle accept patient?

## 2018-02-22 ENCOUNTER — Other Ambulatory Visit (INDEPENDENT_AMBULATORY_CARE_PROVIDER_SITE_OTHER): Payer: Medicare Other

## 2018-02-22 ENCOUNTER — Ambulatory Visit (INDEPENDENT_AMBULATORY_CARE_PROVIDER_SITE_OTHER): Payer: Medicare Other | Admitting: General Practice

## 2018-02-22 ENCOUNTER — Encounter: Payer: Self-pay | Admitting: *Deleted

## 2018-02-22 ENCOUNTER — Other Ambulatory Visit: Payer: Self-pay | Admitting: Adult Health

## 2018-02-22 DIAGNOSIS — E1165 Type 2 diabetes mellitus with hyperglycemia: Secondary | ICD-10-CM | POA: Diagnosis not present

## 2018-02-22 DIAGNOSIS — K219 Gastro-esophageal reflux disease without esophagitis: Secondary | ICD-10-CM | POA: Diagnosis not present

## 2018-02-22 DIAGNOSIS — I951 Orthostatic hypotension: Secondary | ICD-10-CM | POA: Diagnosis not present

## 2018-02-22 DIAGNOSIS — I25110 Atherosclerotic heart disease of native coronary artery with unstable angina pectoris: Secondary | ICD-10-CM | POA: Diagnosis not present

## 2018-02-22 DIAGNOSIS — G8929 Other chronic pain: Secondary | ICD-10-CM | POA: Diagnosis not present

## 2018-02-22 DIAGNOSIS — Z85819 Personal history of malignant neoplasm of unspecified site of lip, oral cavity, and pharynx: Secondary | ICD-10-CM | POA: Diagnosis not present

## 2018-02-22 DIAGNOSIS — Z7982 Long term (current) use of aspirin: Secondary | ICD-10-CM | POA: Diagnosis not present

## 2018-02-22 DIAGNOSIS — I350 Nonrheumatic aortic (valve) stenosis: Secondary | ICD-10-CM | POA: Diagnosis not present

## 2018-02-22 DIAGNOSIS — J189 Pneumonia, unspecified organism: Secondary | ICD-10-CM | POA: Diagnosis not present

## 2018-02-22 DIAGNOSIS — Z5181 Encounter for therapeutic drug level monitoring: Secondary | ICD-10-CM | POA: Diagnosis not present

## 2018-02-22 DIAGNOSIS — Z7901 Long term (current) use of anticoagulants: Secondary | ICD-10-CM | POA: Diagnosis not present

## 2018-02-22 DIAGNOSIS — I4891 Unspecified atrial fibrillation: Secondary | ICD-10-CM | POA: Diagnosis not present

## 2018-02-22 DIAGNOSIS — I480 Paroxysmal atrial fibrillation: Secondary | ICD-10-CM | POA: Diagnosis not present

## 2018-02-22 DIAGNOSIS — I5032 Chronic diastolic (congestive) heart failure: Secondary | ICD-10-CM | POA: Diagnosis not present

## 2018-02-22 DIAGNOSIS — J90 Pleural effusion, not elsewhere classified: Secondary | ICD-10-CM | POA: Diagnosis not present

## 2018-02-22 DIAGNOSIS — Z7951 Long term (current) use of inhaled steroids: Secondary | ICD-10-CM | POA: Diagnosis not present

## 2018-02-22 DIAGNOSIS — M1990 Unspecified osteoarthritis, unspecified site: Secondary | ICD-10-CM | POA: Diagnosis not present

## 2018-02-22 DIAGNOSIS — G629 Polyneuropathy, unspecified: Secondary | ICD-10-CM | POA: Diagnosis not present

## 2018-02-22 DIAGNOSIS — D649 Anemia, unspecified: Secondary | ICD-10-CM | POA: Diagnosis not present

## 2018-02-22 DIAGNOSIS — Z86711 Personal history of pulmonary embolism: Secondary | ICD-10-CM | POA: Diagnosis not present

## 2018-02-22 DIAGNOSIS — E782 Mixed hyperlipidemia: Secondary | ICD-10-CM | POA: Diagnosis not present

## 2018-02-22 DIAGNOSIS — Z8673 Personal history of transient ischemic attack (TIA), and cerebral infarction without residual deficits: Secondary | ICD-10-CM | POA: Diagnosis not present

## 2018-02-22 DIAGNOSIS — I110 Hypertensive heart disease with heart failure: Secondary | ICD-10-CM | POA: Diagnosis not present

## 2018-02-22 LAB — HEMOGLOBIN A1C: Hgb A1c MFr Bld: 6.2 % (ref 4.6–6.5)

## 2018-02-22 LAB — POCT INR: INR: 3 (ref 2.0–3.0)

## 2018-02-22 NOTE — Addendum Note (Signed)
Addended by: Elmer Picker on: 02/22/2018 09:33 AM   Modules accepted: Orders

## 2018-02-22 NOTE — Patient Instructions (Signed)
Pre visit review using our clinic review tool, if applicable. No additional management support is needed unless otherwise documented below in the visit note.  Continue to take 1 tablet daily except for 1/2  tablet on Monday/Wed/Fridays/Saturdays.  Re-check in 2 weeks. Dosing instructions given to Hale, RN @ [facility] while in patient's home. [phone].

## 2018-02-25 ENCOUNTER — Telehealth: Payer: Self-pay | Admitting: Adult Health

## 2018-02-25 LAB — STOOL CULTURE
MICRO NUMBER: 91003418
MICRO NUMBER: 91003419
MICRO NUMBER:: 91003420
SHIGA RESULT:: NOT DETECTED
SPECIMEN QUALITY: ADEQUATE
SPECIMEN QUALITY:: ADEQUATE
SPECIMEN QUALITY:: ADEQUATE

## 2018-02-25 NOTE — Telephone Encounter (Signed)
Ok for a followup appt

## 2018-02-25 NOTE — Telephone Encounter (Signed)
cory please advise on the order for the walker.  Pt is wanting to pick this up and they do not have the order for it.

## 2018-02-25 NOTE — Telephone Encounter (Signed)
Copied from [place] [phone]. Topic: Quick Communication - See Telephone Encounter >> Feb 25, 2018 12:59 PM Margot Ables wrote: CRM for notification. See Telephone encounter for: 02/25/18.  Following up on status for order form faxed in 02/20/18 for rollator/walker. Pt is wanting to pick up equipment but they need order prior to. Return fax# [phone]. Please advise.

## 2018-02-26 DIAGNOSIS — J189 Pneumonia, unspecified organism: Secondary | ICD-10-CM | POA: Diagnosis not present

## 2018-02-26 DIAGNOSIS — I25110 Atherosclerotic heart disease of native coronary artery with unstable angina pectoris: Secondary | ICD-10-CM | POA: Diagnosis not present

## 2018-02-26 DIAGNOSIS — I5032 Chronic diastolic (congestive) heart failure: Secondary | ICD-10-CM | POA: Diagnosis not present

## 2018-02-26 DIAGNOSIS — I110 Hypertensive heart disease with heart failure: Secondary | ICD-10-CM | POA: Diagnosis not present

## 2018-02-26 DIAGNOSIS — I951 Orthostatic hypotension: Secondary | ICD-10-CM | POA: Diagnosis not present

## 2018-02-26 DIAGNOSIS — J90 Pleural effusion, not elsewhere classified: Secondary | ICD-10-CM | POA: Diagnosis not present

## 2018-02-28 ENCOUNTER — Ambulatory Visit (INDEPENDENT_AMBULATORY_CARE_PROVIDER_SITE_OTHER): Payer: Medicare Other | Admitting: Pulmonary Disease

## 2018-02-28 ENCOUNTER — Ambulatory Visit: Payer: Medicare Other | Admitting: Adult Health

## 2018-02-28 ENCOUNTER — Encounter: Payer: Self-pay | Admitting: Pulmonary Disease

## 2018-02-28 ENCOUNTER — Telehealth: Payer: Self-pay | Admitting: Family Medicine

## 2018-02-28 VITALS — BP 120/82 | HR 104 | Ht 61.0 in | Wt 111.0 lb

## 2018-02-28 DIAGNOSIS — I951 Orthostatic hypotension: Secondary | ICD-10-CM | POA: Diagnosis not present

## 2018-02-28 DIAGNOSIS — I5032 Chronic diastolic (congestive) heart failure: Secondary | ICD-10-CM

## 2018-02-28 DIAGNOSIS — I25110 Atherosclerotic heart disease of native coronary artery with unstable angina pectoris: Secondary | ICD-10-CM | POA: Diagnosis not present

## 2018-02-28 DIAGNOSIS — I110 Hypertensive heart disease with heart failure: Secondary | ICD-10-CM | POA: Diagnosis not present

## 2018-02-28 DIAGNOSIS — J90 Pleural effusion, not elsewhere classified: Secondary | ICD-10-CM

## 2018-02-28 DIAGNOSIS — J189 Pneumonia, unspecified organism: Secondary | ICD-10-CM | POA: Diagnosis not present

## 2018-02-28 MED ORDER — FUROSEMIDE 40 MG PO TABS: 40.0000 mg | ORAL_TABLET | Freq: Two times a day (BID) | ORAL | 0 refills | Status: DC

## 2018-02-28 NOTE — Patient Instructions (Addendum)
Bilateral pleural effusions  Recent diverticulitis  Not having any fevers or chills, no other symptoms suggesting an infection  Pleural effusions likely related to decompensated congestive heart failure  Will increase dose of Lasix to twice daily  Repeat chest x-ray and electrolytes in about a week  I will have him come back in about a week  We did talk about thoracentesis(draining the fluid) as an option if fluid is not improving with increased water pills  I will see you back in the office in 1 week with one of the nurse practitioners

## 2018-02-28 NOTE — Telephone Encounter (Signed)
Pt gets skilled nursing through [facility]  She would like to get a home health aid.  Ok to order?  Case Manager: Anderson Malta ext [phone] Telephone Number: [phone]

## 2018-02-28 NOTE — Progress Notes (Signed)
[name]    [id number]    Mar 08, 1941  Primary Care Physician:Nafziger, Tommi Rumps, NP  Referring Physician: Dorothyann Peng, NP [place], [place] [address]  Chief complaint:   Patient being seen for bilateral pleural effusions  HPI:  Was recently hospitalized with diverticulitis for which she is completing antibiotics Denies any chest pains or discomfort She is short of breath Multiyear history of congestive heart failure for which she has been on Lasix 40 daily Denies any symptoms suggesting an infectious process at present  Occupation: No pertinent occupational history Exposures: Significant exposures Smoking history: Never smoker  Outpatient Encounter Medications as of 02/28/2018  Medication Sig  . acetaminophen (TYLENOL) 500 MG tablet Take 500-1,000 mg by mouth daily as needed for mild pain or moderate pain.   [name] aspirin EC 81 MG tablet Take 1 tablet (81 mg total) by mouth daily.  . carvedilol (COREG) 25 MG tablet Take 1 tablet (25 mg total) by mouth 2 (two) times daily with a meal.  . cyanocobalamin (,VITAMIN B-12,) 1000 MCG/ML injection IM daily 11/29-12/3, then weekly x4, then monthly.  . ezetimibe-simvastatin (VYTORIN) 10-20 MG tablet Take 1 tablet by mouth daily.  . ferrous sulfate 325 (65 FE) MG tablet Take 325 mg by mouth every evening.   . furosemide (LASIX) 40 MG tablet TAKE 1 TABLET BY MOUTH ONCE DAILY.  [name] gabapentin (NEURONTIN) 300 MG capsule TAKE 1 CAPSULE BY MOUTH THREE TIMES A DAY. (Patient taking differently: Take 300 mg by mouth daily as needed (for pain). PRESCRIBED 1 CAPSULE BY MOUTH THREE TIMES A DAY.)  . meclizine (ANTIVERT) 25 MG tablet Take 25 mg by mouth as needed for dizziness or nausea.   . ondansetron (ZOFRAN) 4 MG tablet Take 1 tablet (4 mg total) by mouth every 8 (eight) hours as needed for nausea or vomiting.  . pantoprazole (PROTONIX) 40 MG tablet TAKE 1 TABLET BY MOUTH TWICE DAILY.  [name] Pediatric Multivitamins-Iron  (FLINTSTONES PLUS IRON) chewable tablet Chew 2 tablets by mouth daily.  . promethazine (PHENERGAN) 12.5 MG tablet Take 1 tablet (12.5 mg total) by mouth every 8 (eight) hours as needed for nausea or vomiting.  . temazepam (RESTORIL) 15 MG capsule TAKE (1) CAPSULE BY MOUTH AT BEDTIME.  . traMADol (ULTRAM) 50 MG tablet TAKE (1) TABLET BY MOUTH EVERY EIGHT HOURS.  [name] warfarin (COUMADIN) 5 MG tablet TAKE 1/2 TABLET BY MOUTH ON WEDNESDAY AND 1 TABLET ALL OTHER DAYS OR AS DIRECTED BY ANTICOAGULATION CLINIC. (Patient taking differently: Take 2.5-5 mg by mouth See admin instructions. Sunday, Thursday, and Sat take a whole tablet. Rest of week take a 1/2 tablet.)  . furosemide (LASIX) 40 MG tablet Take 1 tablet (40 mg total) by mouth 2 (two) times daily.  . [DISCONTINUED] ciprofloxacin (CIPRO) 500 MG tablet Take 1 tablet (500 mg total) by mouth 2 (two) times daily.  . [DISCONTINUED] metroNIDAZOLE (FLAGYL) 500 MG tablet Take 1 tablet (500 mg total) by mouth 3 (three) times daily.   No facility-administered encounter medications on file as of 02/28/2018.     Allergies as of 02/28/2018 - Review Complete 02/28/2018  Allergen Reaction Noted  . Iodinated diagnostic agents Anaphylaxis and Other (See Comments) 10/04/2010  . Ioxaglate Anaphylaxis and Other (See Comments) 09/05/2015  . Red dye Anaphylaxis 03/24/2016  . Lactose intolerance (gi)  02/18/2018  . Whey Other (See Comments) 09/05/2015  . Darvon [propoxyphene] Rash 02/06/2014  . Hydralazine Anxiety and Other (See Comments) 08/24/2014  .  Milk-related compounds Other (See Comments) 02/06/2014    Past Medical History:  Diagnosis Date  . Anemia 2005   Generally microcytic, transfusions in [address], 2012, 02/2015, 05/2015  . Aortic stenosis    Moderate November 2017  . Arthritis   . Broken back 2013   Chronic back pain.   [name] CAD (coronary artery disease)    Cardiac catheterization 2014 - 80% mid RCA and 70% OM managed medically  . CHF (congestive heart  failure) ([facility] [facility])   . Chronic headache   . Contrast media allergy   . Diastolic heart failure ([facility])   . Esophageal cancer ([place]) 05/06/2015   Adenocarcinoma GE junction  . Facial paresthesia   . GERD (gastroesophageal reflux disease)   . H/O iron deficiency    05-06-15 iron infusion ([facility])  . HH (hiatus hernia) 2008   Large with associated erosions  . Hypertension   . Paroxysmal atrial fibrillation ([name])   . PONV (postoperative nausea and vomiting)   . Pulmonary emboli ([place]) 2008, 2012  . Stroke [facility]) 1982    Past Surgical History:  Procedure Laterality Date  . APPENDECTOMY  1950s  . BACK SURGERY    . CARPAL TUNNEL RELEASE Bilateral 1990s  . [place] SURGERY  2014  . CHOLECYSTECTOMY  1980s   open  . COLONOSCOPY N/A 02/17/2015   Procedure: COLONOSCOPY;  Surgeon: [name], MD;  Location: [facility] ENDOSCOPY;  Service: Endoscopy;  Laterality: N/A;  . ESOPHAGOGASTRODUODENOSCOPY N/A 02/16/2015   Procedure: ESOPHAGOGASTRODUODENOSCOPY (EGD);  Surgeon: [name], MD;  Location: [facility];  Service: Endoscopy;  Laterality: N/A;  . ESOPHAGOGASTRODUODENOSCOPY N/A 05/06/2015   Procedure: ESOPHAGOGASTRODUODENOSCOPY (EGD);  Surgeon: [name], MD;  Location: [facility] ENDOSCOPY;  Service: Endoscopy;  Laterality: N/A;  . EUS N/A 05/20/2015   Procedure: UPPER ENDOSCOPIC ULTRASOUND (EUS) LINEAR;  Surgeon: [name], MD;  Location: [facility] ENDOSCOPY;  Service: Endoscopy;  Laterality: N/A;  . exploratory lab  1950s or 1960s  . GIVENS CAPSULE STUDY N/A 05/06/2015   Procedure: GIVENS CAPSULE STUDY;  Surgeon: [name], MD;  Location: [facility] ENDOSCOPY;  Service: Endoscopy;  Laterality: N/A;  . LEFT HEART CATH AND CORONARY ANGIOGRAPHY N/A 11/08/2016   Procedure: Left Heart Cath and Coronary Angiography;  Surgeon: [name], MD;  Location: [facility] LAB;  Service: Cardiovascular;  Laterality: N/A;  . lumbar back surgery  2012  . [place] SURGERY  1991  . TONSILLECTOMY    .  TONSILLECTOMY AND ADENOIDECTOMY  1960s    Family History  Problem Relation Age of Onset  . Stroke Mother   . Heart disease Mother   . Emphysema Father   . Ovarian cancer Sister   . Stroke Sister   . Other Child        died at birth    Social History   Socioeconomic History  . Marital status: Divorced    Spouse name: Not on file  . Number of children: 3  . Years of education: 39  . Highest education level: Not on file  Occupational History  . Occupation: Retired  Scientific laboratory technician  . Financial resource strain: Not on file  . Food insecurity:    Worry: Not on file    Inability: Not on file  . Transportation needs:    Medical: Not on file    Non-medical: Not on file  Tobacco Use  . Smoking status: Never Smoker  . Smokeless tobacco: Never Used  Substance and Sexual Activity  .  Alcohol use: No    Alcohol/week: 0.0 standard drinks  . Drug use: No  . Sexual activity: Not Currently  Lifestyle  . Physical activity:    Days per week: Not on file    Minutes per session: Not on file  . Stress: Not on file  Relationships  . Social connections:    Talks on phone: Not on file    Gets together: Not on file    Attends religious service: Not on file    Active member of club or organization: Not on file    Attends meetings of clubs or organizations: Not on file    Relationship status: Not on file  . Intimate partner violence:    Fear of current or ex partner: Not on file    Emotionally abused: Not on file    Physically abused: Not on file    Forced sexual activity: Not on file  Other Topics Concern  . Not on file  Social History Narrative   Born and raised in [place], [place]. Currently resides in a house with her son. 1 dog. Fun: go to church   Divorced(Has total of #3 children)-[place], [place], Honor Junes   Denies religious beliefs that would effect health care.    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[employer]  Constitutional: Positive for appetite change.  HENT: Negative.   Eyes: Negative.   Respiratory: Negative.   Cardiovascular: Negative.   Gastrointestinal: Positive for nausea.  Endocrine: Negative.   Genitourinary: Negative.   Musculoskeletal: Negative.   Skin: Negative.   Allergic/Immunologic: Negative.   Neurological: Negative.   Hematological: Negative.     Vitals:   02/28/18 1114  BP: 120/82  Pulse: (!) 104  SpO2: 94%     Physical Exam  Constitutional: She is oriented to person, place, and time. She appears well-developed and well-nourished.  HENT:  Head: Atraumatic.  Eyes: Pupils are equal, round, and reactive to light. Conjunctivae and EOM are normal. Right eye exhibits no discharge. Left eye exhibits no discharge.  Neck: Normal range of motion. Neck supple. No tracheal deviation present. No thyromegaly present.  Cardiovascular: Normal rate and regular rhythm.  No murmur heard. Pulmonary/Chest: Effort normal and breath sounds normal. No respiratory distress. She has no wheezes.  Decreased air movement at the bases bilaterally  Abdominal: Soft. Bowel sounds are normal. She exhibits no distension. There is no tenderness.  Musculoskeletal: Normal range of motion. She exhibits no edema.  Neurological: She is alert and oriented to person, place, and time. She has normal reflexes. No cranial nerve deficit.  Skin: Skin is warm and dry. No erythema.  Psychiatric: She has a normal mood and affect. Her behavior is normal.     Data Reviewed: CT abdomen reviewed by myself Chest x-ray reviewed by myself Blood work reviewed-elevated BNP, BUN of 12, creatinine of 0.95  Assessment:  Bilateral pleural effusions likely related to decompensated congestive heart failure  Recent diverticulitis  Abnormal chest x-ray with pleural effusions  Systolic congestive heart failure  Recent treatment for diverticulitis/pneumonia  Plan/Recommendations:  1.   Increase Lasix to 40 twice daily  2.  Follow chest x-ray in about a week  3.  Follow electrolytes in about a week  4.  Thoracentesis of as an option of treatment also discussed with the patient, however, she is not having any evidence of an infectious process at the present time Thoracentesis will  be indicated if nonresolution of the pleural effusion with increased diuresis  I will have her come back to the office in about a week with a repeat chest x-ray   Sherrilyn Rist MD [facility] 02/28/2018, 11:36 AM  CC: Dorothyann Peng, NP

## 2018-02-28 NOTE — Telephone Encounter (Signed)
Ok to order 

## 2018-02-28 NOTE — Telephone Encounter (Signed)
Copied from [place]. Topic: Inquiry >> Feb 28, 2018  2:58 PM Oliver Pila B wrote: Reason for CRM: pt called to speak w/ Misty; contact pt when possible

## 2018-03-01 NOTE — Telephone Encounter (Signed)
Left a message for a return call.

## 2018-03-01 NOTE — Telephone Encounter (Signed)
Spoke to [place] and advised that she proceed with home health aid per [facility].  No further action required.

## 2018-03-05 DIAGNOSIS — I5032 Chronic diastolic (congestive) heart failure: Secondary | ICD-10-CM | POA: Diagnosis not present

## 2018-03-05 DIAGNOSIS — I110 Hypertensive heart disease with heart failure: Secondary | ICD-10-CM | POA: Diagnosis not present

## 2018-03-05 DIAGNOSIS — J189 Pneumonia, unspecified organism: Secondary | ICD-10-CM | POA: Diagnosis not present

## 2018-03-05 DIAGNOSIS — J90 Pleural effusion, not elsewhere classified: Secondary | ICD-10-CM | POA: Diagnosis not present

## 2018-03-05 DIAGNOSIS — I951 Orthostatic hypotension: Secondary | ICD-10-CM | POA: Diagnosis not present

## 2018-03-05 DIAGNOSIS — I25110 Atherosclerotic heart disease of native coronary artery with unstable angina pectoris: Secondary | ICD-10-CM | POA: Diagnosis not present

## 2018-03-07 ENCOUNTER — Ambulatory Visit (INDEPENDENT_AMBULATORY_CARE_PROVIDER_SITE_OTHER): Payer: Medicare Other | Admitting: Nurse Practitioner

## 2018-03-07 ENCOUNTER — Encounter: Payer: Self-pay | Admitting: Nurse Practitioner

## 2018-03-07 ENCOUNTER — Ambulatory Visit (INDEPENDENT_AMBULATORY_CARE_PROVIDER_SITE_OTHER)
Admission: RE | Admit: 2018-03-07 | Discharge: 2018-03-07 | Disposition: A | Discharge status: Home / Self Care | Payer: Medicare Other | Source: Ambulatory Visit | Attending: Pulmonary Disease | Admitting: Pulmonary Disease

## 2018-03-07 ENCOUNTER — Other Ambulatory Visit (INDEPENDENT_AMBULATORY_CARE_PROVIDER_SITE_OTHER): Payer: Medicare Other

## 2018-03-07 VITALS — BP 120/66 | HR 72 | Ht 61.0 in | Wt 110.4 lb

## 2018-03-07 DIAGNOSIS — J189 Pneumonia, unspecified organism: Secondary | ICD-10-CM | POA: Diagnosis not present

## 2018-03-07 DIAGNOSIS — J90 Pleural effusion, not elsewhere classified: Secondary | ICD-10-CM | POA: Diagnosis not present

## 2018-03-07 DIAGNOSIS — I5032 Chronic diastolic (congestive) heart failure: Secondary | ICD-10-CM

## 2018-03-07 LAB — BASIC METABOLIC PANEL
BUN: 14 mg/dL (ref 6–23)
CALCIUM: 9.2 mg/dL (ref 8.4–10.5)
CO2: 40 meq/L — AB (ref 19–32)
Chloride: 95 mEq/L — ABNORMAL LOW (ref 96–112)
Creatinine, Ser: 1.06 mg/dL (ref 0.40–1.20)
GFR: 53.21 mL/min — AB (ref >60.00)
GLUCOSE: 155 mg/dL — AB (ref 70–99)
Potassium: 3.4 mEq/L — ABNORMAL LOW (ref 3.5–5.1)
Sodium: 141 mEq/L (ref 135–145)

## 2018-03-07 LAB — CBC WITH DIFFERENTIAL/PLATELET
BASOS ABS: 0 10*3/uL (ref 0.0–0.1)
Basophils Relative: 0.6 % (ref 0.0–3.0)
EOS PCT: 3.1 % (ref 0.0–5.0)
Eosinophils Absolute: 0.2 10*3/uL (ref 0.0–0.7)
HCT: 42.2 % (ref 36.0–46.0)
Hemoglobin: 14.3 g/dL (ref 12.0–15.0)
LYMPHS ABS: 2.1 10*3/uL (ref 0.7–4.0)
Lymphocytes Relative: 34.2 % (ref 12.0–46.0)
MCHC: 33.9 g/dL (ref 30.0–36.0)
MCV: 85.1 fl (ref 78.0–100.0)
MONO ABS: 0.6 10*3/uL (ref 0.1–1.0)
Monocytes Relative: 10 % (ref 3.0–12.0)
NEUTROS ABS: 3.1 10*3/uL (ref 1.4–7.7)
NEUTROS PCT: 52.1 % (ref 43.0–77.0)
PLATELETS: 226 10*3/uL (ref 150.0–400.0)
RBC: 4.96 Mil/uL (ref 3.87–5.11)
RDW: 13 % (ref 11.5–15.5)
WBC: 6 10*3/uL (ref 4.0–10.5)

## 2018-03-07 LAB — BRAIN NATRIURETIC PEPTIDE: Pro B Natriuretic peptide (BNP): 268 pg/mL — ABNORMAL HIGH (ref 0.0–100.0)

## 2018-03-07 NOTE — Assessment & Plan Note (Signed)
Patient Instructions  Will check CBC and BMP Lasix 40 mg daily now Continue all other medications Please follow up with Dr. Thomes Dinning in 1 week Please call if symptoms worsen or go to the ED

## 2018-03-07 NOTE — Patient Instructions (Addendum)
Will check CBC and BMP Lasix 40 mg daily now Continue all other medications Please follow up with Dr. Thomes Dinning in 1 week Please call if symptoms worsen or go to the ED

## 2018-03-07 NOTE — Progress Notes (Signed)
@Patient  ID: [name], female    DOB: [birth year], 77 y.o.   MRN: [id number]  Chief Complaint  Patient presents with  . Follow-up    Discuss xray and lab results    Referring provider: Dorothyann Peng, NP  HPI   77 year old female with bilateral pleural effusions followed by Dr. Ander Slade.  History includes HTN, CHF, anemia, and GERD.   TESTS: Chest CT 02/16/18: Multifactorial degradation, including lack of IV contrast and Motion. Bilateral pleural effusions, larger on the right. Dependent bibasilar atelectasis. Moderate hiatal hernia. Coronary artery atherosclerosis.  Chest xray 03/07/18: Significant interval improvement of the bilateral pleural effusions which were most recently demonstrated on chest CT of 02/16/2018. At most, there are trace residual pleural effusions at the posterior costophrenic angles, versus residual pleural thickening.  OV 03/07/18 - Follow up pleural effusions Patient presents today for a follow up on bilateral pleural effusions. States that over all she has been doing well. She has ben taking lasix BID instead of daily as directed since last visit. She denies any shortness of breath, fever, chest pain, or edema.  She had x ray and BNP this am which show improvement.    Allergies  Allergen Reactions  . Iodinated Diagnostic Agents Anaphylaxis and Other (See Comments)    IPD dye Info given by patient  . Ioxaglate Anaphylaxis and Other (See Comments)    Info given by patient  . Red Dye Anaphylaxis  . Lactose Intolerance (Gi)   . Whey Other (See Comments)    Lactose intolerance  . Darvon [Propoxyphene] Rash  . Hydralazine Anxiety and Other (See Comments)    Facial flushing, pt prefers not to use it.   . Milk-Related Compounds Other (See Comments)    Lactose intolerance Can tolerate milk if its cooked into the recipe, just can't drink milk     Immunization History  Administered Date(s) Administered  . Pneumococcal Conjugate-13 05/02/2017     Past Medical History:  Diagnosis Date  . Anemia 2005   Generally microcytic, transfusions in [address], 2012, 02/2015, 05/2015  . Aortic stenosis    Moderate November 2017  . Arthritis   . Broken back 2013   Chronic back pain.   [name] CAD (coronary artery disease)    Cardiac catheterization 2014 - 80% mid RCA and 70% OM managed medically  . CHF (congestive heart failure) ([place])   . Chronic headache   . Contrast media allergy   . Diastolic heart failure ([place])   . Esophageal cancer ([place]) 05/06/2015   Adenocarcinoma GE junction  . Facial paresthesia   . GERD (gastroesophageal reflux disease)   . H/O iron deficiency    05-06-15 iron infusion ([facility])  . HH (hiatus hernia) 2008   Large with associated erosions  . Hypertension   . Paroxysmal atrial fibrillation ([name])   . PONV (postoperative nausea and vomiting)   . Pulmonary emboli ([place]) 2008, 2012  . Stroke ([place]) 1982    Tobacco History: Social History   Tobacco Use  Smoking Status Never Smoker  Smokeless Tobacco Never Used   Counseling given: Not Answered   Outpatient Encounter Medications as of 03/07/2018  Medication Sig  . acetaminophen (TYLENOL) 500 MG tablet Take 500-1,000 mg by mouth daily as needed for mild pain or moderate pain.   [name] aspirin EC 81 MG tablet Take 1 tablet (81 mg total) by mouth daily.  . cyanocobalamin (,VITAMIN B-12,) 1000 MCG/ML injection IM daily 11/29-12/3, then weekly x4, then monthly.  . ezetimibe-simvastatin (  VYTORIN) 10-20 MG tablet Take 1 tablet by mouth daily.  . ferrous sulfate 325 (65 FE) MG tablet Take 325 mg by mouth every evening.   . furosemide (LASIX) 40 MG tablet TAKE 1 TABLET BY MOUTH ONCE DAILY.  . furosemide (LASIX) 40 MG tablet Take 1 tablet (40 mg total) by mouth 2 (two) times daily.  [name] gabapentin (NEURONTIN) 300 MG capsule TAKE 1 CAPSULE BY MOUTH THREE TIMES A DAY. (Patient taking differently: Take 300 mg by mouth daily as needed (for pain). PRESCRIBED 1 CAPSULE BY MOUTH THREE TIMES A  DAY.)  . meclizine (ANTIVERT) 25 MG tablet Take 25 mg by mouth as needed for dizziness or nausea.   . ondansetron (ZOFRAN) 4 MG tablet Take 1 tablet (4 mg total) by mouth every 8 (eight) hours as needed for nausea or vomiting.  . pantoprazole (PROTONIX) 40 MG tablet TAKE 1 TABLET BY MOUTH TWICE DAILY.  [name] Pediatric Multivitamins-Iron (FLINTSTONES PLUS IRON) chewable tablet Chew 2 tablets by mouth daily.  . promethazine (PHENERGAN) 12.5 MG tablet Take 1 tablet (12.5 mg total) by mouth every 8 (eight) hours as needed for nausea or vomiting.  . temazepam (RESTORIL) 15 MG capsule TAKE (1) CAPSULE BY MOUTH AT BEDTIME.  . traMADol (ULTRAM) 50 MG tablet TAKE (1) TABLET BY MOUTH EVERY EIGHT HOURS.  [name] warfarin (COUMADIN) 5 MG tablet TAKE 1/2 TABLET BY MOUTH ON WEDNESDAY AND 1 TABLET ALL OTHER DAYS OR AS DIRECTED BY ANTICOAGULATION CLINIC. (Patient taking differently: Take 2.5-5 mg by mouth See admin instructions. Sunday, Thursday, and Sat take a whole tablet. Rest of week take a 1/2 tablet.)  . carvedilol (COREG) 25 MG tablet Take 1 tablet (25 mg total) by mouth 2 (two) times daily with a meal.   No facility-administered encounter medications on file as of 03/07/2018.      Review of Systems  Review of Systems  Constitutional: Positive for appetite change and fatigue. Negative for fever.  HENT: Negative.   Respiratory: Positive for cough. Negative for chest tightness, shortness of breath and wheezing.   Cardiovascular: Negative.  Negative for chest pain and leg swelling.  Gastrointestinal: Negative.   Allergic/Immunologic: Negative.   Neurological: Negative.   Psychiatric/Behavioral: Negative.        Physical Exam  BP 120/66 (BP Location: Left Arm, Patient Position: Sitting, Cuff Size: Normal)   Pulse 72   Ht 5\' 1"  (1.549 m)   Wt 110 lb 6.4 oz (50.1 kg)   SpO2 98%   BMI 20.86 kg/m   Wt Readings from Last 5 Encounters:  03/07/18 110 lb 6.4 oz (50.1 kg)  02/28/18 111 lb (50.3 kg)   02/21/18 114 lb 6 oz (51.9 kg)  02/18/18 113 lb 12.8 oz (51.6 kg)  01/29/18 115 lb (52.2 kg)     Physical Exam  Constitutional: She is oriented to person, place, and time. She appears well-developed and well-nourished. No distress.  Cardiovascular: Normal rate and regular rhythm.  Pulmonary/Chest: Effort normal and breath sounds normal.  Neurological: She is alert and oriented to person, place, and time.  Psychiatric: She has a normal mood and affect.  Nursing note and vitals reviewed.    Lab Results:  CBC    Component Value Date/Time   WBC 6.0 03/07/2018 1111   RBC 4.96 03/07/2018 1111   HGB 14.3 03/07/2018 1111   HGB 13.9 11/05/2017 1139   HGB 13.1 06/04/2017 1017   HCT 42.2 03/07/2018 1111   HCT 38.6 06/04/2017 1017   PLT 226.0 03/07/2018  1111   PLT 168 11/05/2017 1139   PLT 158 06/04/2017 1017   MCV 85.1 03/07/2018 1111   MCV 88.5 06/04/2017 1017   MCH 29.2 02/18/2018 0527   MCHC 33.9 03/07/2018 1111   RDW 13.0 03/07/2018 1111   RDW 12.7 06/04/2017 1017   LYMPHSABS 2.1 03/07/2018 1111   LYMPHSABS 1.5 06/04/2017 1017   MONOABS 0.6 03/07/2018 1111   MONOABS 0.4 06/04/2017 1017   EOSABS 0.2 03/07/2018 1111   EOSABS 0.2 06/04/2017 1017   BASOSABS 0.0 03/07/2018 1111   BASOSABS 0.0 06/04/2017 1017    BMET    Component Value Date/Time   NA 141 03/07/2018 1111   K 3.4 (L) 03/07/2018 1111   CL 95 (L) 03/07/2018 1111   CO2 40 (H) 03/07/2018 1111   GLUCOSE 155 (H) 03/07/2018 1111   BUN 14 03/07/2018 1111   CREATININE 1.06 03/07/2018 1111   CREATININE 1.02 (H) 11/02/2016 1133   CALCIUM 9.2 03/07/2018 1111   GFRNONAA 50 (L) 02/18/2018 0527   GFRAA 58 (L) 02/18/2018 0527    BNP    Component Value Date/Time   BNP 531.0 (H) 02/15/2018 1504    ProBNP    Component Value Date/Time   PROBNP 268.0 (H) 03/07/2018 0939    Imaging: Ct Abdomen Pelvis Wo Contrast  Result Date: 02/16/2018 CLINICAL DATA:  Upper abdominal pain for 1-2 weeks. Abnormal chest  radiograph. Gastroenteritis or colitis suspected. History of coronary artery disease and hypertension. Esophageal cancer. Appendectomy. Cholecystectomy. EXAM: CT CHEST, ABDOMEN AND PELVIS WITHOUT CONTRAST TECHNIQUE: Multidetector CT imaging of the chest, abdomen and pelvis was performed following the standard protocol without IV contrast. COMPARISON:  Chest radiograph 02/15/2018. Abdominopelvic CT 10/10/2016. Chest CT 05/06/2017. FINDINGS: CT CHEST FINDINGS Cardiovascular: Mild motion degradation throughout. Aortic and branch vessel atherosclerosis. Tortuous thoracic aorta. Mild cardiomegaly with minimal pericardial fluid, likely physiologic. Aortic valve calcification. Multivessel coronary artery atherosclerosis. Mediastinum/Nodes: No supraclavicular adenopathy. No mediastinal or definite hilar adenopathy, given limitations of unenhanced CT. A moderate hiatal hernia Lungs/Pleura: Moderate right and small left pleural effusion. Dependent bibasilar airspace disease, favored to represent atelectasis. Musculoskeletal: No acute osseous abnormality. Lower cervical spine fixation. CT ABDOMEN PELVIS FINDINGS Hepatobiliary: Suspect a right hepatic lobe cyst, likely present on the prior. 9 mm today. Cholecystectomy, without biliary ductal dilatation. Pancreas: Mild pancreatic atrophy, without duct dilatation. Spleen: Calcified splenic lesion is chronic and likely related to remote infection or trauma. Adrenals/Urinary Tract: Normal adrenal glands. Mild renal cortical thinning bilaterally. 1.5 cm upper pole left renal low-density lesion is likely a cyst. No hydronephrosis. More mild motion degradation continuing into the abdomen and pelvis. No bladder calculi. Stomach/Bowel: Normal distal stomach. Scattered colonic diverticula. Equivocal edema adjacent the hepatic flexure of the colon, including on 64/2. Normal terminal ileum. Appendix not visualized. Normal small bowel. Vascular/Lymphatic: Advanced aortic and branch vessel  atherosclerosis. No abdominopelvic adenopathy. Reproductive: Normal uterus and adnexa. Other: Small volume cul-de-sac fluid is new since the prior. Moderate pelvic floor laxity. No evidence of omental or peritoneal disease. Musculoskeletal: Mild osteopenia. Thoracolumbar spondylosis with prior spine spinous process fixation at L4-5. IMPRESSION: 1. Multifactorial degradation, including lack of IV contrast and motion. 2. Bilateral pleural effusions, larger on the right. Dependent bibasilar atelectasis. 3. Moderate hiatal hernia. 4. Coronary artery atherosclerosis. Aortic Atherosclerosis (ICD10-I70.0). Aortic valvular calcifications. Consider echocardiography to evaluate for valvular dysfunction. 5. Equivocal edema adjacent the hepatic flexure of the colon. Cannot exclude mild diverticulitis or less likely colitis. This area is suboptimally evaluated, secondary to above limitations and  lack of enteric opacification of the colon. 6. Trace pelvic fluid, possibly related to fluid overload. Electronically Signed   By: Abigail Miyamoto M.D.   On: 02/16/2018 17:10   Dg Chest 2 View  Result Date: 03/07/2018 CLINICAL DATA:  Follow-up bilateral pleural effusion and pneumonia. EXAM: CHEST - 2 VIEW COMPARISON:  Chest x-ray dated 02/15/2018. Chest CT dated 02/16/2018. FINDINGS: Significant interval reduction in the bilateral pleural effusions. Perhaps trace bilateral pleural effusions which are only visible on the lateral view at the posterior costophrenic angle, versus residual mild pleural thickening. Lungs otherwise clear. No pneumothorax seen. Heart size and mediastinal contours are within normal limits. Atherosclerotic changes noted at the aortic arch. No acute or suspicious osseous finding. IMPRESSION: Significant interval improvement of the bilateral pleural effusions which were most recently demonstrated on chest CT of 02/16/2018. At most, there are trace residual pleural effusions at the posterior costophrenic angles,  versus residual pleural thickening. Electronically Signed   By: Franki Cabot M.D.   On: 03/07/2018 10:33   Dg Chest 2 View  Result Date: 02/15/2018 CLINICAL DATA:  Hypertension EXAM: CHEST - 2 VIEW COMPARISON:  December 19, 2017 FINDINGS: There is airspace opacity in the lateral right base with small right pleural effusion. There is also a small left pleural effusion. There is consolidation in the medial right base. Lungs elsewhere are clear. Heart is upper normal in size with mild pulmonary venous hypertension. No adenopathy. There is aortic atherosclerosis. There is postoperative change in the lower cervical region. There is degenerative change in the thoracic spine. Bones are osteoporotic. IMPRESSION: Consolidation in the medial and lateral aspects of the right base, felt to represent pneumonia. Small pleural effusions bilaterally. There is a degree of pulmonary vascular congestion. There is aortic atherosclerosis. Bones are osteoporotic. Aortic Atherosclerosis (ICD10-I70.0). Followup PA and lateral chest radiographs recommended in 3-4 weeks following trial of antibiotic therapy to ensure resolution and exclude underlying malignancy. Electronically Signed   By: Lowella Grip III M.D.   On: 02/15/2018 16:08   Ct Chest Wo Contrast  Result Date: 02/16/2018 CLINICAL DATA:  Upper abdominal pain for 1-2 weeks. Abnormal chest radiograph. Gastroenteritis or colitis suspected. History of coronary artery disease and hypertension. Esophageal cancer. Appendectomy. Cholecystectomy. EXAM: CT CHEST, ABDOMEN AND PELVIS WITHOUT CONTRAST TECHNIQUE: Multidetector CT imaging of the chest, abdomen and pelvis was performed following the standard protocol without IV contrast. COMPARISON:  Chest radiograph 02/15/2018. Abdominopelvic CT 10/10/2016. Chest CT 05/06/2017. FINDINGS: CT CHEST FINDINGS Cardiovascular: Mild motion degradation throughout. Aortic and branch vessel atherosclerosis. Tortuous thoracic aorta. Mild  cardiomegaly with minimal pericardial fluid, likely physiologic. Aortic valve calcification. Multivessel coronary artery atherosclerosis. Mediastinum/Nodes: No supraclavicular adenopathy. No mediastinal or definite hilar adenopathy, given limitations of unenhanced CT. A moderate hiatal hernia Lungs/Pleura: Moderate right and small left pleural effusion. Dependent bibasilar airspace disease, favored to represent atelectasis. Musculoskeletal: No acute osseous abnormality. Lower cervical spine fixation. CT ABDOMEN PELVIS FINDINGS Hepatobiliary: Suspect a right hepatic lobe cyst, likely present on the prior. 9 mm today. Cholecystectomy, without biliary ductal dilatation. Pancreas: Mild pancreatic atrophy, without duct dilatation. Spleen: Calcified splenic lesion is chronic and likely related to remote infection or trauma. Adrenals/Urinary Tract: Normal adrenal glands. Mild renal cortical thinning bilaterally. 1.5 cm upper pole left renal low-density lesion is likely a cyst. No hydronephrosis. More mild motion degradation continuing into the abdomen and pelvis. No bladder calculi. Stomach/Bowel: Normal distal stomach. Scattered colonic diverticula. Equivocal edema adjacent the hepatic flexure of the colon, including on 64/2. Normal  terminal ileum. Appendix not visualized. Normal small bowel. Vascular/Lymphatic: Advanced aortic and branch vessel atherosclerosis. No abdominopelvic adenopathy. Reproductive: Normal uterus and adnexa. Other: Small volume cul-de-sac fluid is new since the prior. Moderate pelvic floor laxity. No evidence of omental or peritoneal disease. Musculoskeletal: Mild osteopenia. Thoracolumbar spondylosis with prior spine spinous process fixation at L4-5. IMPRESSION: 1. Multifactorial degradation, including lack of IV contrast and motion. 2. Bilateral pleural effusions, larger on the right. Dependent bibasilar atelectasis. 3. Moderate hiatal hernia. 4. Coronary artery atherosclerosis. Aortic  Atherosclerosis (ICD10-I70.0). Aortic valvular calcifications. Consider echocardiography to evaluate for valvular dysfunction. 5. Equivocal edema adjacent the hepatic flexure of the colon. Cannot exclude mild diverticulitis or less likely colitis. This area is suboptimally evaluated, secondary to above limitations and lack of enteric opacification of the colon. 6. Trace pelvic fluid, possibly related to fluid overload. Electronically Signed   By: Abigail Miyamoto M.D.   On: 02/16/2018 17:10     Assessment & Plan:   Bilateral pleural effusion Patient Instructions  Will check CBC and BMP Lasix 40 mg daily now Continue all other medications Please follow up with Dr. Thomes Dinning in 1 week Please call if symptoms worsen or go to the ED       Fenton Foy, NP 03/07/2018

## 2018-03-08 ENCOUNTER — Telehealth: Payer: Self-pay | Admitting: Adult Health

## 2018-03-08 ENCOUNTER — Ambulatory Visit (INDEPENDENT_AMBULATORY_CARE_PROVIDER_SITE_OTHER): Payer: Medicare Other | Admitting: General Practice

## 2018-03-08 DIAGNOSIS — J189 Pneumonia, unspecified organism: Secondary | ICD-10-CM | POA: Diagnosis not present

## 2018-03-08 DIAGNOSIS — I4891 Unspecified atrial fibrillation: Secondary | ICD-10-CM | POA: Diagnosis not present

## 2018-03-08 DIAGNOSIS — I110 Hypertensive heart disease with heart failure: Secondary | ICD-10-CM | POA: Diagnosis not present

## 2018-03-08 DIAGNOSIS — I5032 Chronic diastolic (congestive) heart failure: Secondary | ICD-10-CM | POA: Diagnosis not present

## 2018-03-08 DIAGNOSIS — I951 Orthostatic hypotension: Secondary | ICD-10-CM | POA: Diagnosis not present

## 2018-03-08 DIAGNOSIS — Z7901 Long term (current) use of anticoagulants: Secondary | ICD-10-CM | POA: Diagnosis not present

## 2018-03-08 DIAGNOSIS — J90 Pleural effusion, not elsewhere classified: Secondary | ICD-10-CM | POA: Diagnosis not present

## 2018-03-08 DIAGNOSIS — I25110 Atherosclerotic heart disease of native coronary artery with unstable angina pectoris: Secondary | ICD-10-CM | POA: Diagnosis not present

## 2018-03-08 LAB — POCT INR: INR: 2.1 (ref 2.0–3.0)

## 2018-03-08 NOTE — Telephone Encounter (Signed)
Noted! Thank you

## 2018-03-08 NOTE — Telephone Encounter (Signed)
Left a message on identified voicemail informing Amy to proceed with orders.  Advised a call back if any questions.  No further action required.

## 2018-03-08 NOTE — Telephone Encounter (Signed)
Copied from [place] [phone]. Topic: Quick Communication - See Telephone Encounter >> Mar 08, 2018 10:49 AM Synthia Innocent wrote: CRM for notification. See Telephone encounter for: 03/08/18. [facility] calling for physical therapy verbal orders, 2x a week for 3 weeks

## 2018-03-08 NOTE — Patient Instructions (Addendum)
Pre visit review using our clinic review tool, if applicable. No additional management support is needed unless otherwise documented below in the visit note.,h  Continue to take 1 tablet daily except for 1/2  tablet on Monday/Wed/Fridays/Saturdays.  Re-check in 2 weeks. Dosing instructions given to Michele Mcalpine, RN @ [facility] while in patient's home. [phone]

## 2018-03-08 NOTE — Telephone Encounter (Signed)
Michele Mcalpine, RN with [facility], called to report the pt's INR is 2.1 on 03/08/18 at 1045; will route to office for notification of this value.

## 2018-03-08 NOTE — Telephone Encounter (Signed)
Ok for verbal orders ?

## 2018-03-12 DIAGNOSIS — J189 Pneumonia, unspecified organism: Secondary | ICD-10-CM | POA: Diagnosis not present

## 2018-03-12 DIAGNOSIS — J90 Pleural effusion, not elsewhere classified: Secondary | ICD-10-CM | POA: Diagnosis not present

## 2018-03-12 DIAGNOSIS — I951 Orthostatic hypotension: Secondary | ICD-10-CM | POA: Diagnosis not present

## 2018-03-12 DIAGNOSIS — I25110 Atherosclerotic heart disease of native coronary artery with unstable angina pectoris: Secondary | ICD-10-CM | POA: Diagnosis not present

## 2018-03-12 DIAGNOSIS — I110 Hypertensive heart disease with heart failure: Secondary | ICD-10-CM | POA: Diagnosis not present

## 2018-03-12 DIAGNOSIS — I5032 Chronic diastolic (congestive) heart failure: Secondary | ICD-10-CM | POA: Diagnosis not present

## 2018-03-13 ENCOUNTER — Telehealth: Payer: Self-pay | Admitting: Adult Health

## 2018-03-13 DIAGNOSIS — I110 Hypertensive heart disease with heart failure: Secondary | ICD-10-CM | POA: Diagnosis not present

## 2018-03-13 DIAGNOSIS — I25110 Atherosclerotic heart disease of native coronary artery with unstable angina pectoris: Secondary | ICD-10-CM | POA: Diagnosis not present

## 2018-03-13 DIAGNOSIS — J90 Pleural effusion, not elsewhere classified: Secondary | ICD-10-CM | POA: Diagnosis not present

## 2018-03-13 DIAGNOSIS — J189 Pneumonia, unspecified organism: Secondary | ICD-10-CM | POA: Diagnosis not present

## 2018-03-13 DIAGNOSIS — I951 Orthostatic hypotension: Secondary | ICD-10-CM | POA: Diagnosis not present

## 2018-03-13 DIAGNOSIS — I5032 Chronic diastolic (congestive) heart failure: Secondary | ICD-10-CM | POA: Diagnosis not present

## 2018-03-13 NOTE — Telephone Encounter (Signed)
Let cut back on Coreg to 12.5 mg BID

## 2018-03-13 NOTE — Telephone Encounter (Signed)
Copied from [place] [phone]. Topic: General - Other >> Mar 13, 2018 10:33 AM Yvette Rack wrote: Reason for CRM: Nurse Janett Billow from [facility] [phone] calling to let him know that pt BP has been low today with readings of 95/55 110/60 pt has been having back pain since Saturday and complaining of a lot of dizziness for a few weeks

## 2018-03-13 NOTE — Telephone Encounter (Signed)
Spoke to [place] and informed her to have the pt cut back her Coreg to 12.5 mg bid.  Updated in [place].  No further action required.

## 2018-03-14 DIAGNOSIS — I5032 Chronic diastolic (congestive) heart failure: Secondary | ICD-10-CM | POA: Diagnosis not present

## 2018-03-14 DIAGNOSIS — I951 Orthostatic hypotension: Secondary | ICD-10-CM | POA: Diagnosis not present

## 2018-03-14 DIAGNOSIS — I25110 Atherosclerotic heart disease of native coronary artery with unstable angina pectoris: Secondary | ICD-10-CM | POA: Diagnosis not present

## 2018-03-14 DIAGNOSIS — J189 Pneumonia, unspecified organism: Secondary | ICD-10-CM | POA: Diagnosis not present

## 2018-03-14 DIAGNOSIS — J90 Pleural effusion, not elsewhere classified: Secondary | ICD-10-CM | POA: Diagnosis not present

## 2018-03-14 DIAGNOSIS — I110 Hypertensive heart disease with heart failure: Secondary | ICD-10-CM | POA: Diagnosis not present

## 2018-03-15 ENCOUNTER — Other Ambulatory Visit (INDEPENDENT_AMBULATORY_CARE_PROVIDER_SITE_OTHER): Payer: Medicare Other

## 2018-03-15 ENCOUNTER — Ambulatory Visit (INDEPENDENT_AMBULATORY_CARE_PROVIDER_SITE_OTHER): Payer: Medicare Other | Admitting: Pulmonary Disease

## 2018-03-15 ENCOUNTER — Ambulatory Visit (INDEPENDENT_AMBULATORY_CARE_PROVIDER_SITE_OTHER)
Admission: RE | Admit: 2018-03-15 | Discharge: 2018-03-15 | Disposition: A | Discharge status: Home / Self Care | Payer: Medicare Other | Source: Ambulatory Visit | Attending: Pulmonary Disease | Admitting: Pulmonary Disease

## 2018-03-15 ENCOUNTER — Encounter: Payer: Self-pay | Admitting: Pulmonary Disease

## 2018-03-15 VITALS — BP 110/62 | HR 70 | Ht 62.0 in | Wt 113.8 lb

## 2018-03-15 DIAGNOSIS — I5032 Chronic diastolic (congestive) heart failure: Secondary | ICD-10-CM

## 2018-03-15 DIAGNOSIS — J90 Pleural effusion, not elsewhere classified: Secondary | ICD-10-CM | POA: Diagnosis not present

## 2018-03-15 LAB — BASIC METABOLIC PANEL
BUN: 9 mg/dL (ref 6–23)
CALCIUM: 9.4 mg/dL (ref 8.4–10.5)
CO2: 34 meq/L — AB (ref 19–32)
Chloride: 96 mEq/L (ref 96–112)
Creatinine, Ser: 0.86 mg/dL (ref 0.40–1.20)
GFR: 67.73 mL/min (ref >60.00)
GLUCOSE: 147 mg/dL — AB (ref 70–99)
Potassium: 4 mEq/L (ref 3.5–5.1)
SODIUM: 141 meq/L (ref 135–145)

## 2018-03-15 NOTE — Patient Instructions (Signed)
Decompensated congestive heart failure Pleural effusions  Still not feeling better  We will obtain a chest x-ray today Obtain a BM P today Obtain an overnight oximetry  Continue with diuresis  We will see her back in the office in about 6 weeks Encouraged discussion with cardiology regarding echocardiogram findings

## 2018-03-15 NOTE — Progress Notes (Signed)
[name]    [id number]    01/18/1941  Primary Care Physician:Nafziger, Tommi Rumps, NP  Referring Physician: Dorothyann Peng, NP [place], [place] [address]  Chief complaint:   Continues to be short of breath She has bilateral pleural effusion that improved on recent chest x-ray evaluation  HPI: Was recently seen in the office with some improvement in symptoms She states improvement is not maintained Remains quite fatigued Shortness of breath with minimal exertion  Most recent chest x-ray did show improvement in pleural effusions  She has multiyear history of congestive heart failure which does appear to be progressing  She is complaining of increasing back pain, has had multiple surgeries in the past  Occupation: No pertinent occupational history Exposures: Significant exposures Smoking history: Never smoker  Outpatient Encounter Medications as of 03/15/2018  Medication Sig  . acetaminophen (TYLENOL) 500 MG tablet Take 500-1,000 mg by mouth daily as needed for mild pain or moderate pain.   [name] aspirin EC 81 MG tablet Take 1 tablet (81 mg total) by mouth daily.  . cyanocobalamin (,VITAMIN B-12,) 1000 MCG/ML injection IM daily 11/29-12/3, then weekly x4, then monthly.  . ezetimibe-simvastatin (VYTORIN) 10-20 MG tablet Take 1 tablet by mouth daily.  . ferrous sulfate 325 (65 FE) MG tablet Take 325 mg by mouth every evening.   . furosemide (LASIX) 40 MG tablet TAKE 1 TABLET BY MOUTH ONCE DAILY.  . furosemide (LASIX) 40 MG tablet Take 1 tablet (40 mg total) by mouth 2 (two) times daily.  [name] gabapentin (NEURONTIN) 300 MG capsule TAKE 1 CAPSULE BY MOUTH THREE TIMES A DAY. (Patient taking differently: Take 300 mg by mouth daily as needed (for pain). PRESCRIBED 1 CAPSULE BY MOUTH THREE TIMES A DAY.)  . meclizine (ANTIVERT) 25 MG tablet Take 25 mg by mouth as needed for dizziness or nausea.   . ondansetron (ZOFRAN) 4 MG tablet Take 1 tablet (4 mg total) by  mouth every 8 (eight) hours as needed for nausea or vomiting.  . pantoprazole (PROTONIX) 40 MG tablet TAKE 1 TABLET BY MOUTH TWICE DAILY.  [name] Pediatric Multivitamins-Iron (FLINTSTONES PLUS IRON) chewable tablet Chew 2 tablets by mouth daily.  . promethazine (PHENERGAN) 12.5 MG tablet Take 1 tablet (12.5 mg total) by mouth every 8 (eight) hours as needed for nausea or vomiting.  . temazepam (RESTORIL) 15 MG capsule TAKE (1) CAPSULE BY MOUTH AT BEDTIME.  . traMADol (ULTRAM) 50 MG tablet TAKE (1) TABLET BY MOUTH EVERY EIGHT HOURS.  [name] warfarin (COUMADIN) 5 MG tablet TAKE 1/2 TABLET BY MOUTH ON WEDNESDAY AND 1 TABLET ALL OTHER DAYS OR AS DIRECTED BY ANTICOAGULATION CLINIC. (Patient taking differently: Take 2.5-5 mg by mouth See admin instructions. Sunday, Thursday, and Sat take a whole tablet. Rest of week take a 1/2 tablet.)  . carvedilol (COREG) 25 MG tablet Take 1 tablet (25 mg total) by mouth 2 (two) times daily with a meal. (Patient taking differently: Take 12.5 mg by mouth 2 (two) times daily with a meal. )   No facility-administered encounter medications on file as of 03/15/2018.     Allergies as of 03/15/2018 - Review Complete 03/15/2018  Allergen Reaction Noted  . Iodinated diagnostic agents Anaphylaxis and Other (See Comments) 10/04/2010  . Ioxaglate Anaphylaxis and Other (See Comments) 09/05/2015  . Red dye Anaphylaxis 03/24/2016  . Lactose intolerance (gi)  02/18/2018  . Whey Other (See Comments) 09/05/2015  . Darvon [propoxyphene] Rash 02/06/2014  .  Hydralazine Anxiety and Other (See Comments) 08/24/2014  . Milk-related compounds Other (See Comments) 02/06/2014    Past Medical History:  Diagnosis Date  . Anemia 2005   Generally microcytic, transfusions in [address], 2012, 02/2015, 05/2015  . Aortic stenosis    Moderate November 2017  . Arthritis   . Broken back 2013   Chronic back pain.   [name] CAD (coronary artery disease)    Cardiac catheterization 2014 - 80% mid RCA and 70% OM  managed medically  . CHF (congestive heart failure) ([facility])   . Chronic headache   . Contrast media allergy   . Diastolic heart failure ([facility])   . Esophageal cancer ([facility]) 05/06/2015   Adenocarcinoma GE junction  . Facial paresthesia   . GERD (gastroesophageal reflux disease)   . H/O iron deficiency    05-06-15 iron infusion ([facility])  . HH (hiatus hernia) 2008   Large with associated erosions  . Hypertension   . Paroxysmal atrial fibrillation ([name])   . PONV (postoperative nausea and vomiting)   . Pulmonary emboli ([facility]) 2008, 2012  . Stroke [facility]) 1982    Past Surgical History:  Procedure Laterality Date  . APPENDECTOMY  1950s  . BACK SURGERY    . CARPAL TUNNEL RELEASE Bilateral 1990s  . [place] SURGERY  2014  . CHOLECYSTECTOMY  1980s   open  . COLONOSCOPY N/A 02/17/2015   Procedure: COLONOSCOPY;  Surgeon: [name], MD;  Location: [facility] ENDOSCOPY;  Service: Endoscopy;  Laterality: N/A;  . ESOPHAGOGASTRODUODENOSCOPY N/A 02/16/2015   Procedure: ESOPHAGOGASTRODUODENOSCOPY (EGD);  Surgeon: [name], MD;  Location: [facility] ENDOSCOPY;  Service: Endoscopy;  Laterality: N/A;  . ESOPHAGOGASTRODUODENOSCOPY N/A 05/06/2015   Procedure: ESOPHAGOGASTRODUODENOSCOPY (EGD);  Surgeon: [name], MD;  Location: [facility] ENDOSCOPY;  Service: Endoscopy;  Laterality: N/A;  . EUS N/A 05/20/2015   Procedure: UPPER ENDOSCOPIC ULTRASOUND (EUS) LINEAR;  Surgeon: [name], MD;  Location: [facility] ENDOSCOPY;  Service: Endoscopy;  Laterality: N/A;  . exploratory lab  1950s or 1960s  . GIVENS CAPSULE STUDY N/A 05/06/2015   Procedure: GIVENS CAPSULE STUDY;  Surgeon: [name], MD;  Location: [facility] ENDOSCOPY;  Service: Endoscopy;  Laterality: N/A;  . LEFT HEART CATH AND CORONARY ANGIOGRAPHY N/A 11/08/2016   Procedure: Left Heart Cath and Coronary Angiography;  Surgeon: [name], MD;  Location: [facility] LAB;  Service: Cardiovascular;  Laterality: N/A;  . lumbar back surgery  2012  . [place]  SURGERY  1991  . TONSILLECTOMY    . TONSILLECTOMY AND ADENOIDECTOMY  1960s    Family History  Problem Relation Age of Onset  . Stroke Mother   . Heart disease Mother   . Emphysema Father   . Ovarian cancer Sister   . Stroke Sister   . Other Child        died at birth    Social History   Socioeconomic History  . Marital status: Widowed    Spouse name: Not on file  . Number of children: 3  . Years of education: 11  . Highest education level: Not on file  Occupational History  . Occupation: Retired  Scientific laboratory technician  . Financial resource strain: Not on file  . Food insecurity:    Worry: Not on file    Inability: Not on file  . Transportation needs:    Medical: Not on file    Non-medical: Not on file  Tobacco Use  . Smoking status: Never Smoker  . Smokeless tobacco:  Never Used  Substance and Sexual Activity  . Alcohol use: No    Alcohol/week: 0.0 standard drinks  . Drug use: No  . Sexual activity: Not Currently  Lifestyle  . Physical activity:    Days per week: Not on file    Minutes per session: Not on file  . Stress: Not on file  Relationships  . Social connections:    Talks on phone: Not on file    Gets together: Not on file    Attends religious service: Not on file    Active member of club or organization: Not on file    Attends meetings of clubs or organizations: Not on file    Relationship status: Not on file  . Intimate partner violence:    Fear of current or ex partner: Not on file    Emotionally abused: Not on file    Physically abused: Not on file    Forced sexual activity: Not on file  Other Topics Concern  . Not on file  Social History Narrative   Born and raised in [place], [place]. Currently resides in a house with her son. 1 dog. Fun: go to church   Divorced(Has total of #3 children)-[place], [place], [place]   Denies religious beliefs that would effect health care.    Has strong faith   Prior employment: Set designer  and worked in lab at [employer]  Constitutional: Positive for fatigue. Negative for appetite change.  HENT: Negative.   Eyes: Negative.   Respiratory: Negative.   Cardiovascular: Negative.   Gastrointestinal: Negative for nausea.  Endocrine: Negative.   Musculoskeletal: Positive for back pain.  Skin: Negative.   Allergic/Immunologic: Negative.     Vitals:   03/15/18 1025  BP: 110/62  Pulse: 70  SpO2: 100%     Physical Exam  Constitutional: She appears well-developed and well-nourished.  HENT:  Head: Normocephalic and atraumatic.  Eyes: Pupils are equal, round, and reactive to light. Conjunctivae and EOM are normal. Right eye exhibits no discharge. Left eye exhibits no discharge.  Neck: Normal range of motion. Neck supple. No tracheal deviation present. No thyromegaly present.  Cardiovascular: Normal rate and regular rhythm.  No murmur heard. Pulmonary/Chest: Effort normal and breath sounds normal. No respiratory distress. She has no wheezes.  Decreased air movement at the bases bilaterally   Data Reviewed:  Most recent chest x-ray reviewed Recent lab data reviewed myself  Assessment:   Bilateral pleural effusions, improved on recent chest x-ray  Decompensated congestive heart failure  History of pulmonary hypertension  Systolic congestive heart failure  Valvulopathy  Severe limiting back pain  Plan/Recommendations: 1.  Continue Lasix at current 40 mg daily  2.  We will obtain BMP, repeat chest x-ray to ascertain improvement in pleural effusions  3.  Thoracentesis as an option of care discussed with the patient again today  4.  Optimizing fluid status appears to be a way forward  5.  Encouraged patient to discuss back pain and management with her primary doctor  6.  She has an appointment to see cardiology in the coming week as well  I will see her back in the office in about 6 weeks   Sherrilyn Rist MD   Pulmonary and Critical Care 03/15/2018, 10:59 AM  CC: Dorothyann Peng, NP

## 2018-03-18 DIAGNOSIS — J90 Pleural effusion, not elsewhere classified: Secondary | ICD-10-CM | POA: Diagnosis not present

## 2018-03-18 DIAGNOSIS — J189 Pneumonia, unspecified organism: Secondary | ICD-10-CM | POA: Diagnosis not present

## 2018-03-18 DIAGNOSIS — I110 Hypertensive heart disease with heart failure: Secondary | ICD-10-CM | POA: Diagnosis not present

## 2018-03-18 DIAGNOSIS — I5032 Chronic diastolic (congestive) heart failure: Secondary | ICD-10-CM | POA: Diagnosis not present

## 2018-03-18 DIAGNOSIS — I951 Orthostatic hypotension: Secondary | ICD-10-CM | POA: Diagnosis not present

## 2018-03-18 DIAGNOSIS — I25110 Atherosclerotic heart disease of native coronary artery with unstable angina pectoris: Secondary | ICD-10-CM | POA: Diagnosis not present

## 2018-03-19 ENCOUNTER — Ambulatory Visit: Payer: Medicare Other | Admitting: Internal Medicine

## 2018-03-19 DIAGNOSIS — I110 Hypertensive heart disease with heart failure: Secondary | ICD-10-CM | POA: Diagnosis not present

## 2018-03-19 DIAGNOSIS — J189 Pneumonia, unspecified organism: Secondary | ICD-10-CM | POA: Diagnosis not present

## 2018-03-19 DIAGNOSIS — I951 Orthostatic hypotension: Secondary | ICD-10-CM | POA: Diagnosis not present

## 2018-03-19 DIAGNOSIS — J90 Pleural effusion, not elsewhere classified: Secondary | ICD-10-CM | POA: Diagnosis not present

## 2018-03-19 DIAGNOSIS — I25110 Atherosclerotic heart disease of native coronary artery with unstable angina pectoris: Secondary | ICD-10-CM | POA: Diagnosis not present

## 2018-03-19 DIAGNOSIS — I5032 Chronic diastolic (congestive) heart failure: Secondary | ICD-10-CM | POA: Diagnosis not present

## 2018-03-20 ENCOUNTER — Encounter: Payer: Self-pay | Admitting: Internal Medicine

## 2018-03-20 ENCOUNTER — Ambulatory Visit: Payer: Medicare Other | Admitting: Internal Medicine

## 2018-03-20 ENCOUNTER — Ambulatory Visit (INDEPENDENT_AMBULATORY_CARE_PROVIDER_SITE_OTHER): Payer: Medicare Other | Admitting: Internal Medicine

## 2018-03-20 DIAGNOSIS — R079 Chest pain, unspecified: Secondary | ICD-10-CM

## 2018-03-20 DIAGNOSIS — I480 Paroxysmal atrial fibrillation: Secondary | ICD-10-CM

## 2018-03-20 DIAGNOSIS — I350 Nonrheumatic aortic (valve) stenosis: Secondary | ICD-10-CM | POA: Diagnosis not present

## 2018-03-20 NOTE — Progress Notes (Signed)
OFFICE NOTE  Chief Complaint:  Chronic pain, multiple complaints  Primary Care Physician: Dorothyann Peng, NP  HPI:  [name] is a pleasant 77 year old female kindly referred to me be Dr. Ardeth Perfect. She reports a past medical history significant for atrial fibrillation and congestive heart failure, which occurred around the same time and probably 2012. She vaguely recalls seeing Dr. Irish Lack at that time.  She was started on warfarin therapy and no attempts were made to get her back into rhythm as she was paroxysmal. She does has a long-standing history of hypertension and dyslipidemia. She apparently developed heart failure or however has never had reassessment of her ejection fraction. She subsequently was having health issues and decided to move with her son to live in [place]. She says at that time she had some problems with chest pain and ultimately underwent a stress test. This was associated with an anaphylactic type reaction and despite their reassurances she will "no longer have any more stress test". She says she has a severe allergy to IV contrast dye which causes throat swelling as well as milk which also causes the same symptoms. Apparently she also underwent cardiac catheterization however she says that only medical therapy was recommended. This was in [place], [place].  We are trying to request records from there as well. She subsequently moved with her son to [facility], however he lost his job and she recently moved back to [place]. Currently she denies any significant shortness of breath but does report some mild leg edema which is slightly worse and occasional pain in her left chest and left arm over the last several days. She's also had pain in the right chest. None of those symptoms are worse with exertion or relieved by rest or medication.   Mrs. Brinkmeier returns today for followup. She again appears upset and quite nervous. Blood pressure recently has been significantly  elevated. In fact she went to the emergency room and was hospitalized for blood pressure control. Although it seems that her blood pressure more easily was controlled once her oral medicines were restarted. Subsequently she's been started on clonidine is now up to twice a day. Blood pressure today was still high at 947 systolic. She reports some occasional chest discomfort, mostly at rest and reports not being active enough to know if she has symptoms with exertion. She also has pain all over body significant low back pain and is seeing a specialist about that in the next few weeks.  I saw Ms. Appelhans back in the office today. She reports some improvement in her chest discomfort and I think this is related to blood pressure. The recent switch in her blood pressure medicines indicate a marked improvement in her pressures in she is noted to be 130/66 today. She does get some slight shortness of breath with exertion and no regular chest discomfort. At this time there is not clear evidence to pursue a further workup although I have a low threshold for further cardiac workup if she should become more symptomatic. She is in sinus rhythm and seems to be maintaining that.  Mrs. Delgreco returns today for follow-up. She's had a remarkable past 6 months. Unfortunately she was diagnosed with a gastric cancer which was considered very high risk to resect. She was referred to [facility] for an endoscopic resection which was successful with clean margins. Prior to that she underwent cardiac evaluation by [facility] cardiology including a nuclear stress test which was low risk and  didn't involve exercise. She reports that during the last minute of exercise she went into A. fib. She's had recurrent A. fib on and off and in fact was just hospitalized again for A. fib a few weeks ago which converted spontaneously back to sinus rhythm, prior to cardiology is evaluating her. She was then directed to follow-up with me. She has  fortunately been on warfarin with therapeutic INRs. Of note, I did review her cardiac catheterization which was performed at an outside hospital 2014 which did demonstrate an 80% mid RCA stenosis as well as a 70% OM lesion and some mild to moderate disease of other vessels. At the time no stent was recommended, which leads me to believe these may be smaller vessels. The fact that her most recent stress test was again negative suggest that they are not flow limiting even though they would seem significant. Also, today she is describing left hand pain. She says that there is exquisite tenderness the left hand and it's difficult for her to extend her finger. She often notes that her left middle finger gets stuck. On palpation I do not notice any significant arthritis of the joints or nodules on the tendons.  04/19/2016  [name] returns today for follow-up. She is concerned about shortness of breath which is been progressive. She reports recently she's had some loss of vocal power and noted some upper airway wheezing. We and related with her on the office today and her oxygen saturations were noted to be 97% at rest and that came down to 95% with ambulation. EKG shows sinus rhythm at 66. Blood pressure is mildly elevated today. She has a nonproductive cough but denies fever, chills or sweats. Weight is up to 141 from 130 pounds 6 months ago.  07/11/2015  [name] returns today for follow-up. She seems to be doing fairly well. She denies any worsening shortness of breath or chest pain. She seems to be suffering from some acid reflux and will likely be undergoing another EGD with Dr. Benson Norway. She continues to have problems with voice changes which could be due to vocal cord inflammation related to her reflux.  09/21/2016  [name] is here today for follow-up. She had an INR check in the office. She is scheduled to have upcoming surgery for large hiatal hernia. Warm to [facility] by Dr. Precious Bard. Risk  factors for surgery including aortic stenosis, however this is recently assessed and is moderate. She denies any chest pain or anginal symptoms.  11/02/2016  Mrs. Johanns returns for follow-up today. Recently she's been having uncontrolled hypertension. 5 blood pressures been over [id number] systolic. She's not had a history with difficult to control hypertension in the past. He's also had associated chest discomfort. The other day she had severe chest discomfort and she has had headaches. In 2013 she underwent cardia catheterization in [place] which showed some moderate coronary disease including what appears to be an 80% stenosis of the RCA however was not treated at the time. She also had proximal LAD disease which was mild to moderate. I'm concerned that she's had progression of her coronary artery disease as a possible cause of her uncontrolled hypertension. She reports the pain has occurred at rest but can occur with exertion. She also gets short of breath with exertion. She's felt more fatigue as well. The pain is described as sharp and nonradiating in the central chest. Unfortunate she's on warfarin for PAF and will need to hold that for 5 days prior  to the procedure to safely undergo cardiac catheterization. Although he recently cleared her for hernia repair, she never underwent the surgery as she was felt to be at higher risk. Given this new information I would not recommend hernia surgery in the near future until we have a better idea of her coronary arteries.  11/16/2016  Mrs. Montecalvo returns for follow-up. She underwent heart catheterization by Dr. Ellyn Hack on 11/08/2016 which showed hyperdynamic LVEF greater than 65%, there was a moderate 60% ramus lesion and 60% RPDA lesion which was not thought to be significant. There was mild aortic valvular stenosis. Blood pressure was elevated. Medical therapy including increased blood pressure control was recommended. Since discharge she has done well. She denied any  competitions from the right femoral access site. She's had one or 2 brief episodes of chest discomfort. Unfortunately after I saw her in the office at the last visit she fell in the parking lot by the [employer] cafeteria. Ultimately she fractured her right radial styloid. That is now casted and in a sling. She does not anticipate the need for surgery, but she would be considered acceptable risk for surgery. Blood pressure is improved however not yet at goal.  02/19/2017  Mrs. Cumberland is seen today in follow-up. She is doing well other than painful osteoarthritis. Her blood pressures been better controlled although she is in need of refills. She was found to have moderate aortic stenosis by last echo however was only noted to be mild by cardiac catheterization. She'll need a repeat echo in about 6 months.  09/10/2016  Mrs. Scantlebury returns today for follow-up.  She is under a lot of stress today.  Her sister is in the hospital and had a repeat heart surgery.  Mrs. Fee his blood pressure is much better controlled today 114/60.  Overall she feels well and denies chest pain or worsening shortness of breath.  As noted she did have moderate coronary artery disease and even some severe distal coronary disease managed medically by heart cath in 2014.  Recently she had a lipid profile last week showing total cholesterol 182, triglycerides 109, HDL 54, LDL 107.  She is on simvastatin 20 mg.  She does have moderate aortic stenosis by echo in 05/2016.  03/20/2018  Mrs. Garritano is seen today in follow-up.  She has multiple complaints today including significant chest and back pain, weakness, fatigue.  Work-up recently including hospitalization demonstrated some acute heart failure with LVEF 50 to 55%.  She has permanent A. fib at this point and is not likely that that is the main cause of her weakness.  She was diuresed and is also noted to have mild to moderate aortic stenosis with a mean gradient of 14 mmHg in August.   Again I do not think this is likely the cause of her multiple somatic complaints.  She does have diffuse arthritis and neuropathy and is on medication for that.  She may very well need pain management.  PMHx:  Past Medical History:  Diagnosis Date  . Anemia 2005   Generally microcytic, transfusions in [address], 2012, 02/2015, 05/2015  . Aortic stenosis    Moderate November 2017  . Arthritis   . Broken back 2013   Chronic back pain.   [name] CAD (coronary artery disease)    Cardiac catheterization 2014 - 80% mid RCA and 70% OM managed medically  . CHF (congestive heart failure) ([place])   . Chronic headache   . Contrast media allergy   .  Diastolic heart failure ([place])   . Esophageal cancer ([place]) 05/06/2015   Adenocarcinoma GE junction  . Facial paresthesia   . GERD (gastroesophageal reflux disease)   . H/O iron deficiency    05-06-15 iron infusion ([facility])  . HH (hiatus hernia) 2008   Large with associated erosions  . Hypertension   . Paroxysmal atrial fibrillation ([name])   . PONV (postoperative nausea and vomiting)   . Pulmonary emboli ([place]) 2008, 2012  . Stroke [facility]) 1982    Past Surgical History:  Procedure Laterality Date  . APPENDECTOMY  1950s  . BACK SURGERY    . CARPAL TUNNEL RELEASE Bilateral 1990s  . [place] SURGERY  2014  . CHOLECYSTECTOMY  1980s   open  . COLONOSCOPY N/A 02/17/2015   Procedure: COLONOSCOPY;  Surgeon: [name], MD;  Location: [facility] ENDOSCOPY;  Service: Endoscopy;  Laterality: N/A;  . ESOPHAGOGASTRODUODENOSCOPY N/A 02/16/2015   Procedure: ESOPHAGOGASTRODUODENOSCOPY (EGD);  Surgeon: [name], MD;  Location: [facility] ENDOSCOPY;  Service: Endoscopy;  Laterality: N/A;  . ESOPHAGOGASTRODUODENOSCOPY N/A 05/06/2015   Procedure: ESOPHAGOGASTRODUODENOSCOPY (EGD);  Surgeon: [name], MD;  Location: [facility] ENDOSCOPY;  Service: Endoscopy;  Laterality: N/A;  . EUS N/A 05/20/2015   Procedure: UPPER ENDOSCOPIC ULTRASOUND (EUS) LINEAR;  Surgeon: [name], MD;   Location: [facility] ENDOSCOPY;  Service: Endoscopy;  Laterality: N/A;  . exploratory lab  1950s or 1960s  . GIVENS CAPSULE STUDY N/A 05/06/2015   Procedure: GIVENS CAPSULE STUDY;  Surgeon: [name], MD;  Location: [facility] ENDOSCOPY;  Service: Endoscopy;  Laterality: N/A;  . LEFT HEART CATH AND CORONARY ANGIOGRAPHY N/A 11/08/2016   Procedure: Left Heart Cath and Coronary Angiography;  Surgeon: [name], MD;  Location: [facility] CV LAB;  Service: Cardiovascular;  Laterality: N/A;  . lumbar back surgery  2012  . [place] SURGERY  1991  . TONSILLECTOMY    . TONSILLECTOMY AND ADENOIDECTOMY  1960s    FAMHx:  Family History  Problem Relation Age of Onset  . Stroke Mother   . Heart disease Mother   . Emphysema Father   . Ovarian cancer Sister   . Stroke Sister   . Other Child        died at birth    SOCHx:   reports that she has never smoked. She has never used smokeless tobacco. She reports that she does not drink alcohol or use drugs.  ALLERGIES:  Allergies  Allergen Reactions  . Iodinated Diagnostic Agents Anaphylaxis and Other (See Comments)    IPD dye Info given by patient  . Ioxaglate Anaphylaxis and Other (See Comments)    Info given by patient  . Red Dye Anaphylaxis  . Lactose Intolerance (Gi)   . Whey Other (See Comments)    Lactose intolerance  . Darvon [Propoxyphene] Rash  . Hydralazine Anxiety and Other (See Comments)    Facial flushing, pt prefers not to use it.   . Milk-Related Compounds Other (See Comments)    Lactose intolerance Can tolerate milk if its cooked into the recipe, just can't drink milk     ROS: Pertinent items noted in HPI and remainder of comprehensive ROS otherwise negative.  HOME MEDS: Current Outpatient Medications  Medication Sig Dispense Refill  . acetaminophen (TYLENOL) 500 MG tablet Take 500-1,000 mg by mouth daily as needed for mild pain or moderate pain.     [name] aspirin EC 81 MG tablet Take 1 tablet (81 mg total) by mouth daily. 90  tablet  3  . cyanocobalamin (,VITAMIN B-12,) 1000 MCG/ML injection IM daily 11/29-12/3, then weekly x4, then monthly. 9 mL 0  . ezetimibe-simvastatin (VYTORIN) 10-20 MG tablet Take 1 tablet by mouth daily. 30 tablet 6  . ferrous sulfate 325 (65 FE) MG tablet Take 325 mg by mouth every evening.     . furosemide (LASIX) 40 MG tablet TAKE 1 TABLET BY MOUTH ONCE DAILY. 90 tablet 1  . gabapentin (NEURONTIN) 300 MG capsule TAKE 1 CAPSULE BY MOUTH THREE TIMES A DAY. (Patient taking differently: Take 300 mg by mouth daily as needed (for pain). PRESCRIBED 1 CAPSULE BY MOUTH THREE TIMES A DAY.) 270 capsule 1  . meclizine (ANTIVERT) 25 MG tablet Take 25 mg by mouth as needed for dizziness or nausea.     . ondansetron (ZOFRAN) 4 MG tablet Take 1 tablet (4 mg total) by mouth every 8 (eight) hours as needed for nausea or vomiting. 20 tablet 0  . pantoprazole (PROTONIX) 40 MG tablet TAKE 1 TABLET BY MOUTH TWICE DAILY. 180 tablet 2  . Pediatric Multivitamins-Iron (FLINTSTONES PLUS IRON) chewable tablet Chew 2 tablets by mouth daily. 60 tablet 0  . promethazine (PHENERGAN) 12.5 MG tablet Take 1 tablet (12.5 mg total) by mouth every 8 (eight) hours as needed for nausea or vomiting. 20 tablet 0  . temazepam (RESTORIL) 15 MG capsule TAKE (1) CAPSULE BY MOUTH AT BEDTIME. 30 capsule 0  . traMADol (ULTRAM) 50 MG tablet TAKE (1) TABLET BY MOUTH EVERY EIGHT HOURS. 90 tablet 0  . warfarin (COUMADIN) 5 MG tablet TAKE 1/2 TABLET BY MOUTH ON WEDNESDAY AND 1 TABLET ALL OTHER DAYS OR AS DIRECTED BY ANTICOAGULATION CLINIC. (Patient taking differently: Take 2.5-5 mg by mouth See admin instructions. Sunday, Thursday, and Sat take a whole tablet. Rest of week take a 1/2 tablet.) 90 tablet 1  . carvedilol (COREG) 25 MG tablet Take 1 tablet (25 mg total) by mouth 2 (two) times daily with a meal. (Patient taking differently: Take 12.5 mg by mouth 2 (two) times daily with a meal. ) 60 tablet 0   No current facility-administered medications  for this visit.     LABS/IMAGING: No results found for this or any previous visit (from the past 48 hour(s)). No results found.  VITALS: BP 120/66   Pulse 70   Ht 5\' 2"  (1.575 m)   Wt 111 lb (50.3 kg)   BMI 20.30 kg/m   EXAM: General appearance: alert and no distress Neck: no carotid bruit and no JVD Lungs: clear to auscultation bilaterally Heart: irregularly irregular rhythm, S1, S2 normal and systolic murmur: midsystolic 3/6, crescendo at 2nd right intercostal space Abdomen: soft, non-tender; bowel sounds normal; no masses,  no organomegaly Extremities: normal Pulses: 2+ and symmetric Skin: Skin color, texture, turgor normal. No rashes or lesions Neurologic: Grossly normal Psych: Pleasant  EKG: A. fib at 76 -personally reviewed  ASSESSMENT: 1. Recent unstable angina - cath showed two-vessel moderate CAD and mild aortic stenosis (10/2016) 2. Uncontrolled hypertension 3. Paroxysmal atrial fibrillation on warfarin 4. History of diastolic congestive heart failure - EF 60-65% on echo 5. Dyslipidemia 6. Moderate aortic stenosis - 05/2016 7. Right hand pain with contracture 8. Recent GI cancer/resected  PLAN: 1.   Mrs. Carrillo looks quite poor today in the office.  She has multiple complaints about chest discomfort achiness, back pain, joint pain and overall does not feel well.  Based on her recent echo she has had some decline in LVEF down to 50 to  55% and does have moderate aortic stenosis which appears to be stable, but may be somewhat worse due to her decreased EF.  She has been treated for worsening heart failure and appears euvolemic on exam today.  I think a lot of her symptoms are musculoskeletal and are associated with pain is both neuropathic and somatic.  I would recommend that her PCP consider pain management consultation as this is likely be of benefit for her.  Follow-up in 6 months.  Pixie Casino, MD, [facility], [place]  Director of the [facility] Lipid Disorders &  Cardiovascular Risk Reduction Clinic Diplomate of the American Board of Clinical Lipidology Attending Cardiologist  Direct Dial: [phone]  Fax: [phone]  Website:  [email or web address] [email or web address] Nozomi Mettler 03/20/2018, 10:33 AM

## 2018-03-20 NOTE — Patient Instructions (Signed)
Your physician wants you to follow-up in: 6 months with Dr. Hilty. You will receive a reminder letter in the mail two months in advance. If you don't receive a letter, please call our office to schedule the follow-up appointment.    

## 2018-03-21 DIAGNOSIS — J90 Pleural effusion, not elsewhere classified: Secondary | ICD-10-CM | POA: Diagnosis not present

## 2018-03-21 DIAGNOSIS — J189 Pneumonia, unspecified organism: Secondary | ICD-10-CM | POA: Diagnosis not present

## 2018-03-21 DIAGNOSIS — I951 Orthostatic hypotension: Secondary | ICD-10-CM | POA: Diagnosis not present

## 2018-03-21 DIAGNOSIS — I25110 Atherosclerotic heart disease of native coronary artery with unstable angina pectoris: Secondary | ICD-10-CM | POA: Diagnosis not present

## 2018-03-21 DIAGNOSIS — I110 Hypertensive heart disease with heart failure: Secondary | ICD-10-CM | POA: Diagnosis not present

## 2018-03-21 DIAGNOSIS — I5032 Chronic diastolic (congestive) heart failure: Secondary | ICD-10-CM | POA: Diagnosis not present

## 2018-03-22 ENCOUNTER — Encounter: Payer: Self-pay | Admitting: Internal Medicine

## 2018-03-25 DIAGNOSIS — I110 Hypertensive heart disease with heart failure: Secondary | ICD-10-CM | POA: Diagnosis not present

## 2018-03-25 DIAGNOSIS — J189 Pneumonia, unspecified organism: Secondary | ICD-10-CM | POA: Diagnosis not present

## 2018-03-25 DIAGNOSIS — J90 Pleural effusion, not elsewhere classified: Secondary | ICD-10-CM | POA: Diagnosis not present

## 2018-03-25 DIAGNOSIS — I951 Orthostatic hypotension: Secondary | ICD-10-CM | POA: Diagnosis not present

## 2018-03-25 DIAGNOSIS — I5032 Chronic diastolic (congestive) heart failure: Secondary | ICD-10-CM | POA: Diagnosis not present

## 2018-03-25 DIAGNOSIS — I25110 Atherosclerotic heart disease of native coronary artery with unstable angina pectoris: Secondary | ICD-10-CM | POA: Diagnosis not present

## 2018-03-26 ENCOUNTER — Telehealth: Payer: Self-pay | Admitting: Adult Health

## 2018-03-26 ENCOUNTER — Telehealth: Payer: Self-pay

## 2018-03-26 DIAGNOSIS — I951 Orthostatic hypotension: Secondary | ICD-10-CM | POA: Diagnosis not present

## 2018-03-26 DIAGNOSIS — I25110 Atherosclerotic heart disease of native coronary artery with unstable angina pectoris: Secondary | ICD-10-CM | POA: Diagnosis not present

## 2018-03-26 DIAGNOSIS — I110 Hypertensive heart disease with heart failure: Secondary | ICD-10-CM | POA: Diagnosis not present

## 2018-03-26 DIAGNOSIS — J189 Pneumonia, unspecified organism: Secondary | ICD-10-CM | POA: Diagnosis not present

## 2018-03-26 DIAGNOSIS — J90 Pleural effusion, not elsewhere classified: Secondary | ICD-10-CM | POA: Diagnosis not present

## 2018-03-26 DIAGNOSIS — I5032 Chronic diastolic (congestive) heart failure: Secondary | ICD-10-CM | POA: Diagnosis not present

## 2018-03-26 NOTE — Telephone Encounter (Signed)
Copied from [place] [phone]. Topic: General - Other >> Mar 26, 2018  8:35 AM Janace Aris A wrote: Reason for CRM: Marylouise Stacks called in requesting an order from the nurse to check the patients PT and INR range today. She says the patients appointments were switched and the blood work was not done. She is asking for a call back at [phone].

## 2018-03-26 NOTE — Telephone Encounter (Signed)
Continue with current dose.

## 2018-03-26 NOTE — Telephone Encounter (Signed)
Verbal orders given  

## 2018-03-26 NOTE — Telephone Encounter (Signed)
Ok for verbal order  °

## 2018-03-27 NOTE — Telephone Encounter (Signed)
Please call Janett Billow with [facility] and let her know when to repeat pt INR, best phone number is  [phone]. Thank you

## 2018-03-28 ENCOUNTER — Ambulatory Visit: Payer: Medicare Other | Admitting: Internal Medicine

## 2018-03-28 DIAGNOSIS — I5032 Chronic diastolic (congestive) heart failure: Secondary | ICD-10-CM | POA: Diagnosis not present

## 2018-03-28 DIAGNOSIS — J90 Pleural effusion, not elsewhere classified: Secondary | ICD-10-CM | POA: Diagnosis not present

## 2018-03-28 DIAGNOSIS — I951 Orthostatic hypotension: Secondary | ICD-10-CM | POA: Diagnosis not present

## 2018-03-28 DIAGNOSIS — I110 Hypertensive heart disease with heart failure: Secondary | ICD-10-CM | POA: Diagnosis not present

## 2018-03-28 DIAGNOSIS — I25110 Atherosclerotic heart disease of native coronary artery with unstable angina pectoris: Secondary | ICD-10-CM | POA: Diagnosis not present

## 2018-03-28 DIAGNOSIS — J189 Pneumonia, unspecified organism: Secondary | ICD-10-CM | POA: Diagnosis not present

## 2018-03-28 NOTE — Telephone Encounter (Signed)
Noted  

## 2018-04-01 ENCOUNTER — Other Ambulatory Visit: Payer: Self-pay | Admitting: Adult Health

## 2018-04-01 DIAGNOSIS — I10 Essential (primary) hypertension: Secondary | ICD-10-CM

## 2018-04-02 ENCOUNTER — Ambulatory Visit (INDEPENDENT_AMBULATORY_CARE_PROVIDER_SITE_OTHER): Payer: Medicare Other | Admitting: General Practice

## 2018-04-02 DIAGNOSIS — J189 Pneumonia, unspecified organism: Secondary | ICD-10-CM | POA: Diagnosis not present

## 2018-04-02 DIAGNOSIS — I110 Hypertensive heart disease with heart failure: Secondary | ICD-10-CM | POA: Diagnosis not present

## 2018-04-02 DIAGNOSIS — Z7901 Long term (current) use of anticoagulants: Secondary | ICD-10-CM | POA: Diagnosis not present

## 2018-04-02 DIAGNOSIS — J90 Pleural effusion, not elsewhere classified: Secondary | ICD-10-CM | POA: Diagnosis not present

## 2018-04-02 DIAGNOSIS — I5032 Chronic diastolic (congestive) heart failure: Secondary | ICD-10-CM | POA: Diagnosis not present

## 2018-04-02 DIAGNOSIS — I951 Orthostatic hypotension: Secondary | ICD-10-CM | POA: Diagnosis not present

## 2018-04-02 DIAGNOSIS — I4891 Unspecified atrial fibrillation: Secondary | ICD-10-CM | POA: Diagnosis not present

## 2018-04-02 DIAGNOSIS — I25110 Atherosclerotic heart disease of native coronary artery with unstable angina pectoris: Secondary | ICD-10-CM | POA: Diagnosis not present

## 2018-04-02 LAB — POCT INR: INR: 2.9 (ref 2.0–3.0)

## 2018-04-02 NOTE — Patient Instructions (Signed)
Pre visit review using our clinic review tool, if applicable. No additional management support is needed unless otherwise documented below in the visit note.  Continue to take 1 tablet daily except for 1/2  tablet on Monday/Wed/Fridays/Saturdays.  Re-check in 2 weeks. Dosing instructions given to Jessica, RN @ [facility] while in patient's home. [phone].  

## 2018-04-02 NOTE — Telephone Encounter (Signed)
Ok to refill for one year  

## 2018-04-03 NOTE — Telephone Encounter (Signed)
Sent to the pharmacy by e-scribe. 

## 2018-04-04 ENCOUNTER — Other Ambulatory Visit: Payer: Self-pay | Admitting: Adult Health

## 2018-04-04 ENCOUNTER — Telehealth: Payer: Self-pay | Admitting: Family Medicine

## 2018-04-04 DIAGNOSIS — Z760 Encounter for issue of repeat prescription: Secondary | ICD-10-CM

## 2018-04-04 MED ORDER — TEMAZEPAM 15 MG PO CAPS: 15.0000 mg | ORAL_CAPSULE | Freq: Every day | ORAL | 1 refills | Status: DC

## 2018-04-04 NOTE — Telephone Encounter (Signed)
Cory, please fill below medications.  Thanks!!!

## 2018-04-04 NOTE — Telephone Encounter (Signed)
Copied from [place] [phone]. Topic: Quick Communication - Other Results >> Apr 04, 2018  4:37 PM Yvette Rack wrote: Medication: temazepam (RESTORIL) 15 MG capsule  Preferred Pharmacy: [facility] [place] [facility] [address] [address] Phone: [phone]  Fax: [phone]

## 2018-04-04 NOTE — Telephone Encounter (Signed)
Copied from [place] [id number]. Topic: General - Other >> Apr 04, 2018  4:40 PM Mcneil, Ja-Kwan wrote: Reason for CRM: Pt upset that the requests for a refill on temazepam (RESTORIL) 15 MG capsule was filled at the same time as the carvedilol (COREG) 25 MG tablet. Pt states she needs to speak with Tommi Rumps because she has been without the medicine since Monday. Pt requests call back.

## 2018-04-05 ENCOUNTER — Encounter: Payer: Self-pay | Admitting: *Deleted

## 2018-04-05 NOTE — Telephone Encounter (Signed)
Sent in by [place] on 04/04/18.

## 2018-04-09 DIAGNOSIS — I5032 Chronic diastolic (congestive) heart failure: Secondary | ICD-10-CM | POA: Diagnosis not present

## 2018-04-09 DIAGNOSIS — I951 Orthostatic hypotension: Secondary | ICD-10-CM | POA: Diagnosis not present

## 2018-04-09 DIAGNOSIS — I25110 Atherosclerotic heart disease of native coronary artery with unstable angina pectoris: Secondary | ICD-10-CM | POA: Diagnosis not present

## 2018-04-09 DIAGNOSIS — J189 Pneumonia, unspecified organism: Secondary | ICD-10-CM | POA: Diagnosis not present

## 2018-04-09 DIAGNOSIS — J90 Pleural effusion, not elsewhere classified: Secondary | ICD-10-CM | POA: Diagnosis not present

## 2018-04-09 DIAGNOSIS — I110 Hypertensive heart disease with heart failure: Secondary | ICD-10-CM | POA: Diagnosis not present

## 2018-04-15 ENCOUNTER — Other Ambulatory Visit: Payer: Self-pay

## 2018-04-15 ENCOUNTER — Inpatient Hospital Stay (HOSPITAL_COMMUNITY)
Admission: EM | Admit: 2018-04-15 | Discharge: 2018-04-19 | DRG: 488 | Disposition: A | Discharge status: Skilled Nursing Facility | Payer: Medicare Other | Attending: Internal Medicine | Admitting: Internal Medicine

## 2018-04-15 ENCOUNTER — Inpatient Hospital Stay (HOSPITAL_COMMUNITY): Payer: Medicare Other

## 2018-04-15 ENCOUNTER — Emergency Department (HOSPITAL_COMMUNITY): Payer: Medicare Other

## 2018-04-15 ENCOUNTER — Encounter (HOSPITAL_COMMUNITY): Payer: Self-pay | Admitting: *Deleted

## 2018-04-15 DIAGNOSIS — D509 Iron deficiency anemia, unspecified: Secondary | ICD-10-CM | POA: Diagnosis not present

## 2018-04-15 DIAGNOSIS — Z888 Allergy status to other drugs, medicaments and biological substances status: Secondary | ICD-10-CM

## 2018-04-15 DIAGNOSIS — M1990 Unspecified osteoarthritis, unspecified site: Secondary | ICD-10-CM | POA: Diagnosis not present

## 2018-04-15 DIAGNOSIS — W1839XA Other fall on same level, initial encounter: Secondary | ICD-10-CM | POA: Diagnosis present

## 2018-04-15 DIAGNOSIS — I2510 Atherosclerotic heart disease of native coronary artery without angina pectoris: Secondary | ICD-10-CM | POA: Diagnosis not present

## 2018-04-15 DIAGNOSIS — I350 Nonrheumatic aortic (valve) stenosis: Secondary | ICD-10-CM

## 2018-04-15 DIAGNOSIS — X501XXA Overexertion from prolonged static or awkward postures, initial encounter: Secondary | ICD-10-CM

## 2018-04-15 DIAGNOSIS — M25462 Effusion, left knee: Secondary | ICD-10-CM | POA: Diagnosis not present

## 2018-04-15 DIAGNOSIS — K219 Gastro-esophageal reflux disease without esophagitis: Secondary | ICD-10-CM | POA: Diagnosis not present

## 2018-04-15 DIAGNOSIS — E876 Hypokalemia: Secondary | ICD-10-CM | POA: Diagnosis present

## 2018-04-15 DIAGNOSIS — E739 Lactose intolerance, unspecified: Secondary | ICD-10-CM | POA: Diagnosis present

## 2018-04-15 DIAGNOSIS — R296 Repeated falls: Secondary | ICD-10-CM | POA: Diagnosis not present

## 2018-04-15 DIAGNOSIS — Y92002 Bathroom of unspecified non-institutional (private) residence single-family (private) house as the place of occurrence of the external cause: Secondary | ICD-10-CM | POA: Diagnosis not present

## 2018-04-15 DIAGNOSIS — Y92009 Unspecified place in unspecified non-institutional (private) residence as the place of occurrence of the external cause: Secondary | ICD-10-CM

## 2018-04-15 DIAGNOSIS — R262 Difficulty in walking, not elsewhere classified: Secondary | ICD-10-CM | POA: Diagnosis not present

## 2018-04-15 DIAGNOSIS — Z8249 Family history of ischemic heart disease and other diseases of the circulatory system: Secondary | ICD-10-CM

## 2018-04-15 DIAGNOSIS — S8992XA Unspecified injury of left lower leg, initial encounter: Secondary | ICD-10-CM | POA: Diagnosis not present

## 2018-04-15 DIAGNOSIS — Z8501 Personal history of malignant neoplasm of esophagus: Secondary | ICD-10-CM | POA: Diagnosis not present

## 2018-04-15 DIAGNOSIS — I110 Hypertensive heart disease with heart failure: Secondary | ICD-10-CM | POA: Diagnosis present

## 2018-04-15 DIAGNOSIS — M2392 Unspecified internal derangement of left knee: Secondary | ICD-10-CM | POA: Diagnosis not present

## 2018-04-15 DIAGNOSIS — E782 Mixed hyperlipidemia: Secondary | ICD-10-CM | POA: Diagnosis not present

## 2018-04-15 DIAGNOSIS — Z823 Family history of stroke: Secondary | ICD-10-CM

## 2018-04-15 DIAGNOSIS — Z7901 Long term (current) use of anticoagulants: Secondary | ICD-10-CM | POA: Diagnosis not present

## 2018-04-15 DIAGNOSIS — I10 Essential (primary) hypertension: Secondary | ICD-10-CM | POA: Diagnosis present

## 2018-04-15 DIAGNOSIS — S83242D Other tear of medial meniscus, current injury, left knee, subsequent encounter: Secondary | ICD-10-CM | POA: Diagnosis not present

## 2018-04-15 DIAGNOSIS — D001 Carcinoma in situ of esophagus: Secondary | ICD-10-CM | POA: Diagnosis not present

## 2018-04-15 DIAGNOSIS — I5030 Unspecified diastolic (congestive) heart failure: Secondary | ICD-10-CM | POA: Diagnosis present

## 2018-04-15 DIAGNOSIS — R531 Weakness: Secondary | ICD-10-CM | POA: Diagnosis not present

## 2018-04-15 DIAGNOSIS — Z91041 Radiographic dye allergy status: Secondary | ICD-10-CM

## 2018-04-15 DIAGNOSIS — Z7982 Long term (current) use of aspirin: Secondary | ICD-10-CM

## 2018-04-15 DIAGNOSIS — E860 Dehydration: Secondary | ICD-10-CM | POA: Diagnosis present

## 2018-04-15 DIAGNOSIS — W19XXXA Unspecified fall, initial encounter: Secondary | ICD-10-CM | POA: Diagnosis not present

## 2018-04-15 DIAGNOSIS — Z86711 Personal history of pulmonary embolism: Secondary | ICD-10-CM

## 2018-04-15 DIAGNOSIS — Z9102 Food additives allergy status: Secondary | ICD-10-CM

## 2018-04-15 DIAGNOSIS — Z9181 History of falling: Secondary | ICD-10-CM | POA: Diagnosis not present

## 2018-04-15 DIAGNOSIS — R509 Fever, unspecified: Secondary | ICD-10-CM

## 2018-04-15 DIAGNOSIS — I480 Paroxysmal atrial fibrillation: Secondary | ICD-10-CM | POA: Diagnosis present

## 2018-04-15 DIAGNOSIS — Z79899 Other long term (current) drug therapy: Secondary | ICD-10-CM

## 2018-04-15 DIAGNOSIS — I5032 Chronic diastolic (congestive) heart failure: Secondary | ICD-10-CM | POA: Diagnosis present

## 2018-04-15 DIAGNOSIS — S83242A Other tear of medial meniscus, current injury, left knee, initial encounter: Secondary | ICD-10-CM | POA: Diagnosis not present

## 2018-04-15 DIAGNOSIS — Z8673 Personal history of transient ischemic attack (TIA), and cerebral infarction without residual deficits: Secondary | ICD-10-CM | POA: Diagnosis not present

## 2018-04-15 DIAGNOSIS — M549 Dorsalgia, unspecified: Secondary | ICD-10-CM | POA: Diagnosis present

## 2018-04-15 DIAGNOSIS — I6523 Occlusion and stenosis of bilateral carotid arteries: Secondary | ICD-10-CM | POA: Diagnosis present

## 2018-04-15 DIAGNOSIS — Z85028 Personal history of other malignant neoplasm of stomach: Secondary | ICD-10-CM | POA: Diagnosis not present

## 2018-04-15 DIAGNOSIS — S0990XA Unspecified injury of head, initial encounter: Secondary | ICD-10-CM | POA: Diagnosis not present

## 2018-04-15 DIAGNOSIS — S83512A Sprain of anterior cruciate ligament of left knee, initial encounter: Secondary | ICD-10-CM | POA: Diagnosis present

## 2018-04-15 DIAGNOSIS — R55 Syncope and collapse: Secondary | ICD-10-CM

## 2018-04-15 DIAGNOSIS — M542 Cervicalgia: Secondary | ICD-10-CM | POA: Diagnosis not present

## 2018-04-15 DIAGNOSIS — Z4789 Encounter for other orthopedic aftercare: Secondary | ICD-10-CM | POA: Diagnosis not present

## 2018-04-15 DIAGNOSIS — Z79891 Long term (current) use of opiate analgesic: Secondary | ICD-10-CM

## 2018-04-15 DIAGNOSIS — I060 Rheumatic aortic stenosis: Secondary | ICD-10-CM | POA: Diagnosis not present

## 2018-04-15 DIAGNOSIS — R2681 Unsteadiness on feet: Secondary | ICD-10-CM | POA: Diagnosis not present

## 2018-04-15 DIAGNOSIS — R1310 Dysphagia, unspecified: Secondary | ICD-10-CM | POA: Diagnosis not present

## 2018-04-15 DIAGNOSIS — R279 Unspecified lack of coordination: Secondary | ICD-10-CM | POA: Diagnosis not present

## 2018-04-15 DIAGNOSIS — G8929 Other chronic pain: Secondary | ICD-10-CM | POA: Diagnosis present

## 2018-04-15 DIAGNOSIS — M25562 Pain in left knee: Secondary | ICD-10-CM | POA: Diagnosis not present

## 2018-04-15 DIAGNOSIS — R52 Pain, unspecified: Secondary | ICD-10-CM | POA: Diagnosis not present

## 2018-04-15 DIAGNOSIS — M23322 Other meniscus derangements, posterior horn of medial meniscus, left knee: Secondary | ICD-10-CM | POA: Diagnosis not present

## 2018-04-15 DIAGNOSIS — I959 Hypotension, unspecified: Secondary | ICD-10-CM | POA: Diagnosis not present

## 2018-04-15 DIAGNOSIS — S199XXA Unspecified injury of neck, initial encounter: Secondary | ICD-10-CM | POA: Diagnosis not present

## 2018-04-15 DIAGNOSIS — J90 Pleural effusion, not elsewhere classified: Secondary | ICD-10-CM | POA: Diagnosis not present

## 2018-04-15 DIAGNOSIS — I259 Chronic ischemic heart disease, unspecified: Secondary | ICD-10-CM | POA: Diagnosis not present

## 2018-04-15 DIAGNOSIS — M6281 Muscle weakness (generalized): Secondary | ICD-10-CM | POA: Diagnosis not present

## 2018-04-15 LAB — CBC WITH DIFFERENTIAL/PLATELET
Abs Immature Granulocytes: 0.02 10*3/uL (ref 0.00–0.07)
BASOS PCT: 0 %
Basophils Absolute: 0 10*3/uL (ref 0.0–0.1)
Eosinophils Absolute: 0 10*3/uL (ref 0.0–0.5)
Eosinophils Relative: 1 %
HCT: 38.4 % (ref 36.0–46.0)
Hemoglobin: 13.2 g/dL (ref 12.0–15.0)
Immature Granulocytes: 0 %
LYMPHS ABS: 1.8 10*3/uL (ref 0.7–4.0)
LYMPHS PCT: 24 %
MCH: 29 pg (ref 26.0–34.0)
MCHC: 34.4 g/dL (ref 30.0–36.0)
MCV: 84.4 fL (ref 80.0–100.0)
Monocytes Absolute: 0.8 10*3/uL (ref 0.1–1.0)
Monocytes Relative: 11 %
NEUTROS ABS: 4.8 10*3/uL (ref 1.7–7.7)
Neutrophils Relative %: 64 %
Platelets: 172 10*3/uL (ref 150–400)
RBC: 4.55 MIL/uL (ref 3.87–5.11)
RDW: 11.9 % (ref 11.5–15.5)
WBC: 7.5 10*3/uL (ref 4.0–10.5)
nRBC: 0 % (ref 0.0–0.2)

## 2018-04-15 LAB — PROTIME-INR
INR: 2.41
PROTHROMBIN TIME: 26.1 s — AB (ref 11.4–15.2)

## 2018-04-15 LAB — COMPREHENSIVE METABOLIC PANEL
ALBUMIN: 3.2 g/dL — AB (ref 3.5–5.0)
ALK PHOS: 57 U/L (ref 38–126)
ALT: 10 U/L (ref 0–44)
AST: 13 U/L — ABNORMAL LOW (ref 15–41)
Anion gap: 9 (ref 5–15)
BILIRUBIN TOTAL: 1.6 mg/dL — AB (ref 0.3–1.2)
BUN: 12 mg/dL (ref 8–23)
CO2: 36 mmol/L — ABNORMAL HIGH (ref 22–32)
Calcium: 8.7 mg/dL — ABNORMAL LOW (ref 8.9–10.3)
Chloride: 90 mmol/L — ABNORMAL LOW (ref 98–111)
Creatinine, Ser: 0.81 mg/dL (ref 0.44–1.00)
GFR calc Af Amer: >60 mL/min (ref >60)
GFR calc non Af Amer: >60 mL/min (ref >60)
GLUCOSE: 188 mg/dL — AB (ref 70–99)
POTASSIUM: 2.5 mmol/L — AB (ref 3.5–5.1)
Sodium: 135 mmol/L (ref 135–145)
TOTAL PROTEIN: 6.7 g/dL (ref 6.5–8.1)

## 2018-04-15 LAB — CK: Total CK: 22 U/L — ABNORMAL LOW (ref 38–234)

## 2018-04-15 LAB — MAGNESIUM: Magnesium: 2 mg/dL (ref 1.7–2.4)

## 2018-04-15 LAB — TROPONIN I: Troponin I: <0.03 ng/mL (ref <0.03)

## 2018-04-15 MED ORDER — FUROSEMIDE 40 MG PO TABS
40.0000 mg | ORAL_TABLET | Freq: Every day | ORAL | Status: DC
  Administered 2018-04-15 – 2018-04-19 (×4): 40 mg via ORAL
  Filled 2018-04-15 (×5): qty 1

## 2018-04-15 MED ORDER — POTASSIUM CHLORIDE 10 MEQ/100ML IV SOLN
10.0000 meq | INTRAVENOUS | Status: AC
  Administered 2018-04-15 (×4): 10 meq via INTRAVENOUS
  Filled 2018-04-15 (×3): qty 100

## 2018-04-15 MED ORDER — FERROUS SULFATE 325 (65 FE) MG PO TABS
325.0000 mg | ORAL_TABLET | Freq: Every evening | ORAL | Status: DC
  Administered 2018-04-15 – 2018-04-17 (×3): 325 mg via ORAL
  Filled 2018-04-15 (×3): qty 1

## 2018-04-15 MED ORDER — WARFARIN - PHARMACIST DOSING INPATIENT
Freq: Every day | Status: DC
  Administered 2018-04-16: 19:00:00

## 2018-04-15 MED ORDER — POTASSIUM CHLORIDE CRYS ER 20 MEQ PO TBCR
40.0000 meq | EXTENDED_RELEASE_TABLET | Freq: Once | ORAL | Status: AC
  Administered 2018-04-15: 40 meq via ORAL
  Filled 2018-04-15: qty 2

## 2018-04-15 MED ORDER — SIMVASTATIN 20 MG PO TABS
20.0000 mg | ORAL_TABLET | Freq: Every day | ORAL | Status: DC
  Administered 2018-04-16 – 2018-04-17 (×2): 20 mg via ORAL
  Filled 2018-04-15 (×2): qty 1

## 2018-04-15 MED ORDER — GABAPENTIN 300 MG PO CAPS
300.0000 mg | ORAL_CAPSULE | Freq: Every day | ORAL | Status: DC | PRN
  Administered 2018-04-15 – 2018-04-19 (×4): 300 mg via ORAL
  Filled 2018-04-15 (×5): qty 1

## 2018-04-15 MED ORDER — PANTOPRAZOLE SODIUM 40 MG PO TBEC
40.0000 mg | DELAYED_RELEASE_TABLET | Freq: Two times a day (BID) | ORAL | Status: DC
  Administered 2018-04-15 – 2018-04-19 (×8): 40 mg via ORAL
  Filled 2018-04-15 (×9): qty 1

## 2018-04-15 MED ORDER — ACETAMINOPHEN 650 MG RE SUPP: 650.0000 mg | Freq: Four times a day (QID) | RECTAL | Status: DC | PRN

## 2018-04-15 MED ORDER — FLINTSTONES PLUS IRON PO CHEW: 2.0000 | CHEWABLE_TABLET | Freq: Every day | ORAL | Status: DC

## 2018-04-15 MED ORDER — TRAMADOL HCL 50 MG PO TABS
50.0000 mg | ORAL_TABLET | Freq: Three times a day (TID) | ORAL | Status: DC
  Administered 2018-04-15 – 2018-04-19 (×12): 50 mg via ORAL
  Filled 2018-04-15 (×12): qty 1

## 2018-04-15 MED ORDER — EZETIMIBE 10 MG PO TABS
10.0000 mg | ORAL_TABLET | Freq: Every day | ORAL | Status: DC
  Administered 2018-04-15 – 2018-04-19 (×4): 10 mg via ORAL
  Filled 2018-04-15 (×5): qty 1

## 2018-04-15 MED ORDER — FENTANYL CITRATE (PF) 100 MCG/2ML IJ SOLN
25.0000 ug | Freq: Once | INTRAMUSCULAR | Status: AC
  Administered 2018-04-15: 25 ug via INTRAVENOUS
  Filled 2018-04-15: qty 2

## 2018-04-15 MED ORDER — CARVEDILOL 12.5 MG PO TABS
25.0000 mg | ORAL_TABLET | Freq: Two times a day (BID) | ORAL | Status: DC
  Administered 2018-04-15 – 2018-04-19 (×7): 25 mg via ORAL
  Filled 2018-04-15 (×8): qty 2

## 2018-04-15 MED ORDER — EZETIMIBE-SIMVASTATIN 10-20 MG PO TABS
1.0000 | ORAL_TABLET | Freq: Every day | ORAL | Status: DC
  Filled 2018-04-15 (×3): qty 1

## 2018-04-15 MED ORDER — WARFARIN SODIUM 2.5 MG PO TABS: 2.5000 mg | ORAL_TABLET | ORAL | Status: DC

## 2018-04-15 MED ORDER — WARFARIN SODIUM 2.5 MG PO TABS
2.5000 mg | ORAL_TABLET | Freq: Once | ORAL | Status: AC
  Administered 2018-04-15: 2.5 mg via ORAL
  Filled 2018-04-15: qty 1

## 2018-04-15 MED ORDER — ONDANSETRON HCL 4 MG PO TABS
4.0000 mg | ORAL_TABLET | Freq: Three times a day (TID) | ORAL | Status: DC | PRN
  Administered 2018-04-18 – 2018-04-19 (×2): 4 mg via ORAL
  Filled 2018-04-15 (×2): qty 1

## 2018-04-15 MED ORDER — ACETAMINOPHEN 325 MG PO TABS
650.0000 mg | ORAL_TABLET | Freq: Four times a day (QID) | ORAL | Status: DC | PRN
  Administered 2018-04-15 – 2018-04-16 (×2): 650 mg via ORAL
  Filled 2018-04-15 (×2): qty 2

## 2018-04-15 MED ORDER — SODIUM CHLORIDE 0.9 % IV BOLUS
1000.0000 mL | Freq: Once | INTRAVENOUS | Status: AC
  Administered 2018-04-15: 1000 mL via INTRAVENOUS

## 2018-04-15 MED ORDER — TEMAZEPAM 15 MG PO CAPS
15.0000 mg | ORAL_CAPSULE | Freq: Every day | ORAL | Status: DC
  Administered 2018-04-15 – 2018-04-18 (×4): 15 mg via ORAL
  Filled 2018-04-15 (×4): qty 1

## 2018-04-15 MED ORDER — ADULT MULTIVITAMIN LIQUID CH
15.0000 mL | Freq: Every day | ORAL | Status: DC
  Administered 2018-04-15 – 2018-04-19 (×4): 15 mL via ORAL
  Filled 2018-04-15 (×8): qty 15

## 2018-04-15 MED ORDER — ASPIRIN EC 81 MG PO TBEC
81.0000 mg | DELAYED_RELEASE_TABLET | Freq: Every day | ORAL | Status: DC
  Administered 2018-04-15 – 2018-04-19 (×4): 81 mg via ORAL
  Filled 2018-04-15 (×5): qty 1

## 2018-04-15 MED ORDER — OXYCODONE HCL 5 MG PO TABS
5.0000 mg | ORAL_TABLET | Freq: Four times a day (QID) | ORAL | Status: DC | PRN
  Administered 2018-04-15 – 2018-04-19 (×12): 5 mg via ORAL
  Filled 2018-04-15 (×12): qty 1

## 2018-04-15 MED ORDER — MECLIZINE HCL 12.5 MG PO TABS: 25.0000 mg | ORAL_TABLET | ORAL | Status: DC | PRN

## 2018-04-15 MED ORDER — LACTATED RINGERS IV SOLN
INTRAVENOUS | Status: AC
  Administered 2018-04-15: 10:00:00 via INTRAVENOUS

## 2018-04-15 NOTE — ED Notes (Signed)
[facility] out of stock, Pharmacy aware.

## 2018-04-15 NOTE — ED Notes (Signed)
Date and time results received: 04/15/18 7:06 AM  (use smartphrase ".now" to insert current time)  Test: K+ Critical Value: 2.5  Name of Provider Notified: Tomi Bamberger  Orders Received? Or Actions Taken?: Orders Received - See Orders for details

## 2018-04-15 NOTE — ED Provider Notes (Signed)
[facility] EMERGENCY DEPARTMENT Provider Note   CSN: [id number] Arrival date & time: 04/15/18  0524  Time seen 05:48 AM   History   Chief Complaint Chief Complaint  Patient presents with  . Knee Pain    HPI [name] is a 77 y.o. female.  HPI patient states October 9 while walking to the bathroom twice the same day she passed out.  She states the first time she was still in her bedroom and she felt like she was going to pass out and she did.  The next time she had made it into the bathroom door and passed out again.  She states that she fell she feels like she twisted her left knee and it has been painful since.  She is unable to bear weight on the knee since she fell.  At the time she passed out she states she felt clammy and had some nausea without vomiting.  She denies chest pain, shortness of breath.  She states she did hit her head but she has a lot of pain of her posterior neck.  It does not hurt more with movement.  She states the pain in her neck has gotten worse last night and she states it is constant and sharp.  She states she has arthritis and neuropathy and is "so full of pain".  She states since she fell her left knee is swollen.  She is put lidocaine patches on it without relief.  She denies fever but has had chills since October 12.  She denies cough, sore throat, rhinorrhea, vomiting, or diarrhea, dysuria, or frequency.  She does states she has had some nausea.  She gets around with a walker.  She states she did not come when she first fell because "I was hurting too bad".  Patient is on Coumadin  Patient states her orthopedist is Dr. Wynelle Link but she is never had any joint replacement done.  She has had swelling of 1 of her knees which is what he saw her for.  PCP Dorothyann Peng, NP   Past Medical History:  Diagnosis Date  . Anemia 2005   Generally microcytic, transfusions in [address], 2012, 02/2015, 05/2015  . Aortic stenosis    Moderate November 2017  .  Arthritis   . Broken back 2013   Chronic back pain.   [name] CAD (coronary artery disease)    Cardiac catheterization 2014 - 80% mid RCA and 70% OM managed medically  . CHF (congestive heart failure) ([place])   . Chronic bilateral pleural effusions   . Chronic headache   . Contrast media allergy   . Diastolic heart failure ([place])   . Esophageal cancer ([place]) 05/06/2015   Adenocarcinoma GE junction  . Facial paresthesia   . GERD (gastroesophageal reflux disease)   . H. pylori infection   . H/O iron deficiency    05-06-15 iron infusion ([facility])  . HH (hiatus hernia) 2008   Large with associated erosions  . Hypertension   . Paroxysmal atrial fibrillation ([name])   . PONV (postoperative nausea and vomiting)   . Pulmonary emboli ([place]) 2008, 2012  . Stroke [facility]) 1982    Patient Active Problem List   Diagnosis Date Noted  . Hypokalemia 04/15/2018  . Pneumonia 02/18/2018  . Bilateral pleural effusion   . Generalized postprandial abdominal pain   . CAP (community acquired pneumonia) 02/15/2018  . Orthostatic hypotension 12/21/2017  . Peripheral neuropathy 12/21/2017  . CAD (coronary artery disease) 12/19/2017  . Long term (  current) use of anticoagulants 03/14/2017  . Numbness of face 01/09/2017  . Unstable angina ([place]) 11/02/2016  . Other fatigue 11/02/2016  . B12 deficiency 10/16/2016  . Preoperative cardiovascular examination 09/21/2016  . Hyperglycemia 06/26/2016  . Atrial fibrillation with RVR ([place]) 06/23/2016  . Encounter for medication review and counseling 06/07/2016  . Essential hypertension, benign 04/19/2016  . Aortic valve stenosis 09/28/2015  . 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 [place] lesion, acute   . IDA (iron deficiency anemia)   . Mixed hyperlipidemia 05/05/2015  . GERD (gastroesophageal reflux disease) 05/05/2015  . Absolute anemia   . Anticoagulated on Coumadin   . Osteoarthritis 04/16/2015  . History of pulmonary embolism 03/02/2015  . Shortness of breath  02/22/2015  . Benign neoplasm of descending colon 02/17/2015  . Diverticulosis of large intestine without diverticulitis 02/17/2015  . Esophageal stricture 02/16/2015  . UGI bleed 02/13/2015  . Supratherapeutic INR 02/13/2015  . Thoracic back pain 10/15/2014  . Macular degeneration 09/01/2014  . Benign paroxysmal positional vertigo   . Uncontrolled hypertension 08/24/2014  . Complaints of total body pain 08/03/2014  . Sleep disturbance 08/03/2014  . PAF (paroxysmal atrial fibrillation) ([place]) 01/21/2014  . Long term current use of anticoagulant therapy 01/21/2014  . Chronic diastolic congestive heart failure ([place]) 01/21/2014  . Chest pain 01/21/2014    Past Surgical History:  Procedure Laterality Date  . APPENDECTOMY  1950s  . BACK SURGERY    . CARPAL TUNNEL RELEASE Bilateral 1990s  . [place] SURGERY  2014  . CHOLECYSTECTOMY  1980s   open  . COLONOSCOPY N/A 02/17/2015   Procedure: COLONOSCOPY;  Surgeon: [name], MD;  Location: [facility] ENDOSCOPY;  Service: Endoscopy;  Laterality: N/A;  . ESOPHAGOGASTRODUODENOSCOPY N/A 02/16/2015   Procedure: ESOPHAGOGASTRODUODENOSCOPY (EGD);  Surgeon: [name], MD;  Location: [facility] ENDOSCOPY;  Service: Endoscopy;  Laterality: N/A;  . ESOPHAGOGASTRODUODENOSCOPY N/A 05/06/2015   Procedure: ESOPHAGOGASTRODUODENOSCOPY (EGD);  Surgeon: [name], MD;  Location[facility] ENDOSCOPY;  Service: Endoscopy;  Laterality: N/A;  . EUS N/A 05/20/2015   Procedure: UPPER ENDOSCOPIC ULTRASOUND (EUS) LINEAR;  Surgeon: [name], MD;  Location: [facility] ENDOSCOPY;  Service: Endoscopy;  Laterality: N/A;  . exploratory lab  1950s or 1960s  . GIVENS CAPSULE STUDY N/A 05/06/2015   Procedure: GIVENS CAPSULE STUDY;  Surgeon: [name], MD;  Location: [facility] ENDOSCOPY;  Service: Endoscopy;  Laterality: N/A;  . LEFT HEART CATH AND CORONARY ANGIOGRAPHY N/A 11/08/2016   Procedure: Left Heart Cath and Coronary Angiography;  Surgeon: [name], MD;  Location: [facility] CV LAB;  Service: Cardiovascular;  Laterality: N/A;  . lumbar back surgery  2012  . [place] SURGERY  1991  . TONSILLECTOMY    . TONSILLECTOMY AND ADENOIDECTOMY  1960s     OB History   None      Home Medications    Prior to Admission medications   Medication Sig Start Date End Date Taking? Authorizing Provider  acetaminophen (TYLENOL) 500 MG tablet Take 500-1,000 mg by mouth daily as needed for mild pain or moderate pain.     [provider]  aspirin EC 81 MG tablet Take 1 tablet (81 mg total) by mouth daily. 11/02/16   Hilty, Nadean Corwin, MD  carvedilol (COREG) 25 MG tablet TAKE (1) TABLET BY MOUTH TWICE DAILY WITH A MEAL. 04/03/18   Nafziger, Tommi Rumps, NP  cyanocobalamin (,VITAMIN B-12,) 1000 MCG/ML injection IM daily 11/29-12/3, then weekly x4, then monthly. 05/30/16  Samuella Cota, MD  ezetimibe-simvastatin (VYTORIN) 10-20 MG tablet Take 1 tablet by mouth daily. 09/10/17   Hilty, Nadean Corwin, MD  ferrous sulfate 325 (65 FE) MG tablet Take 325 mg by mouth every evening.     [provider]  furosemide (LASIX) 40 MG tablet TAKE 1 TABLET BY MOUTH ONCE DAILY. 11/13/17   Nafziger, Tommi Rumps, NP  gabapentin (NEURONTIN) 300 MG capsule TAKE 1 CAPSULE BY MOUTH THREE TIMES A DAY. Patient taking differently: Take 300 mg by mouth daily as needed (for pain). PRESCRIBED 1 CAPSULE BY MOUTH THREE TIMES A DAY. 09/05/17   Nafziger, Tommi Rumps, NP  meclizine (ANTIVERT) 25 MG tablet Take 25 mg by mouth as needed for dizziness or nausea.  08/28/14   [provider]  ondansetron (ZOFRAN) 4 MG tablet Take 1 tablet (4 mg total) by mouth every 8 (eight) hours as needed for nausea or vomiting. 02/19/18   Nafziger, Tommi Rumps, NP  pantoprazole (PROTONIX) 40 MG tablet TAKE 1 TABLET BY MOUTH TWICE DAILY. 12/05/17   Dorothyann Peng, NP  Pediatric Multivitamins-Iron (FLINTSTONES PLUS IRON) chewable tablet Chew 2 tablets by mouth daily. 05/30/16   Samuella Cota, MD  promethazine (PHENERGAN) 12.5 MG  tablet Take 1 tablet (12.5 mg total) by mouth every 8 (eight) hours as needed for nausea or vomiting. 02/21/18   Nafziger, Tommi Rumps, NP  temazepam (RESTORIL) 15 MG capsule Take 1 capsule (15 mg total) by mouth at bedtime. 04/04/18 05/04/18  Nafziger, Tommi Rumps, NP  traMADol (ULTRAM) 50 MG tablet TAKE (1) TABLET BY MOUTH EVERY EIGHT HOURS. 02/12/18   Nafziger, Tommi Rumps, NP  warfarin (COUMADIN) 5 MG tablet TAKE 1/2 TABLET BY MOUTH ON WEDNESDAY AND 1 TABLET ALL OTHER DAYS OR AS DIRECTED BY ANTICOAGULATION CLINIC. Patient taking differently: Take 2.5-5 mg by mouth See admin instructions. Sunday, Thursday, and Sat take a whole tablet. Rest of week take a 1/2 tablet. 12/07/17   Dorothyann Peng, NP    Family History Family History  Problem Relation Age of Onset  . Stroke Mother   . Heart disease Mother   . Emphysema Father   . Ovarian cancer Sister   . Stroke Sister   . Other Child        died at birth    Social History Social History   Tobacco Use  . Smoking status: Never Smoker  . Smokeless tobacco: Never Used  Substance Use Topics  . Alcohol use: No    Alcohol/week: 0.0 standard drinks  . Drug use: No  lives with son Uses a walker   Allergies   Iodinated diagnostic agents; Ioxaglate; Red dye; Lactose intolerance (gi); Whey; Darvon [propoxyphene]; Hydralazine; and Milk-related compounds   Review of Systems Review of Systems  Constitutional: Negative.  Negative for activity change, appetite change and fever.  Eyes: Negative.   Respiratory: Negative.   Cardiovascular: Negative.   All other systems reviewed and are negative.    Physical Exam Updated Vital Signs BP (!) 145/79   Pulse (!) 107   Temp 100 F (37.8 C) (Oral)   Resp 16   Ht 5\' 2"  (1.575 m)   Wt 48.5 kg   SpO2 95%   BMI 19.57 kg/m   Vital signs normal except for low-grade fever and tachycardia   Physical Exam  Constitutional: She is oriented to person, place, and time. She appears well-developed and well-nourished.   Non-toxic appearance. She does not appear ill. No distress.  HENT:  Head: Normocephalic and atraumatic.  Right Ear: External ear normal.  Left Ear: External ear normal.  Nose: Nose normal. No mucosal edema or rhinorrhea.  Mouth/Throat: Oropharynx is clear and moist and mucous membranes are normal. No dental abscesses or uvula swelling.  Eyes: Pupils are equal, round, and reactive to light. Conjunctivae and EOM are normal.  Neck: Full passive range of motion without pain.    She is extremely tender to her posterior midline neck.  Cardiovascular: Normal rate, regular rhythm and normal heart sounds. Exam reveals no gallop and no friction rub.  No murmur heard. Pulmonary/Chest: Effort normal and breath sounds normal. No respiratory distress. She has no wheezes. She has no rhonchi. She has no rales. She exhibits no tenderness and no crepitus.  Abdominal: Soft. Normal appearance and bowel sounds are normal. She exhibits no distension. There is no tenderness. There is no rebound and no guarding.  Musculoskeletal: Normal range of motion. She exhibits no edema or tenderness.  Patient is noted to have a lot of swelling in the superior portion of her left knee.  She is unable to straight leg raise the left leg even though I try to encourage her to do it to see if that tendon is intact but she states she cannot.  She is able to straight leg raise and flex her right leg.  Patient is noted to have multiple lidocaine patches around her left knee, at least 6.  Neurological: She is alert and oriented to person, place, and time. She has normal strength. No cranial nerve deficit.  Skin: Skin is warm, dry and intact. No rash noted. No erythema. No pallor.  Psychiatric: She has a normal mood and affect. Her speech is normal and behavior is normal. Her mood appears not anxious.  Nursing note and vitals reviewed.        ED Treatments / Results  Labs (all labs ordered are listed, but only abnormal results are  displayed) Results for orders placed or performed during the hospital encounter of 04/15/18  Comprehensive metabolic panel  Result Value Ref Range   Sodium 135 135 - 145 mmol/L   Potassium 2.5 (LL) 3.5 - 5.1 mmol/L   Chloride 90 (L) 98 - 111 mmol/L   CO2 36 (H) 22 - 32 mmol/L   Glucose, Bld 188 (H) 70 - 99 mg/dL   BUN 12 8 - 23 mg/dL   Creatinine, Ser 0.81 0.44 - 1.00 mg/dL   Calcium 8.7 (L) 8.9 - 10.3 mg/dL   Total Protein 6.7 6.5 - 8.1 g/dL   Albumin 3.2 (L) 3.5 - 5.0 g/dL   AST 13 (L) 15 - 41 U/L   ALT 10 0 - 44 U/L   Alkaline Phosphatase 57 38 - 126 U/L   Total Bilirubin 1.6 (H) 0.3 - 1.2 mg/dL   GFR calc non Af Amer >60 >60 mL/min   GFR calc Af Amer >60 >60 mL/min   Anion gap 9 5 - 15  CBC with Differential  Result Value Ref Range   WBC 7.5 4.0 - 10.5 K/uL   RBC 4.55 3.87 - 5.11 MIL/uL   Hemoglobin 13.2 12.0 - 15.0 g/dL   HCT 38.4 36.0 - 46.0 %   MCV 84.4 80.0 - 100.0 fL   MCH 29.0 26.0 - 34.0 pg   MCHC 34.4 30.0 - 36.0 g/dL   RDW 11.9 11.5 - 15.5 %   Platelets 172 150 - 400 K/uL   nRBC 0.0 0.0 - 0.2 %   Neutrophils Relative % 64 %   Neutro Abs 4.8 1.7 -  7.7 K/uL   Lymphocytes Relative 24 %   Lymphs Abs 1.8 0.7 - 4.0 K/uL   Monocytes Relative 11 %   Monocytes Absolute 0.8 0.1 - 1.0 K/uL   Eosinophils Relative 1 %   Eosinophils Absolute 0.0 0.0 - 0.5 K/uL   Basophils Relative 0 %   Basophils Absolute 0.0 0.0 - 0.1 K/uL   Immature Granulocytes 0 %   Abs Immature Granulocytes 0.02 0.00 - 0.07 K/uL  Protime-INR  Result Value Ref Range   Prothrombin Time 26.1 (H) 11.4 - 15.2 seconds   INR 2.41   CK  Result Value Ref Range   Total CK 22 (L) 38 - 234 U/L   Laboratory interpretation all normal except therapeutic INR, marked hypokalemia, low chloride, very elevated bicarb, hyperglycemia    EKG EKG Interpretation  Date/Time:  Monday April 15 2018 06:09:48 EDT Ventricular Rate:  96 PR Interval:    QRS Duration: 91 QT Interval:  384 QTC Calculation: 486 R  Axis:   44 Text Interpretation:  Atrial fibrillation Minimal ST depression, diffuse leads Borderline prolonged QT interval Electrode noise No significant change since last tracing 15 Feb 2018 Confirmed by Rolland Porter [phone]) on 04/15/2018 7:08:22 AM   Radiology Ct Head Wo Contrast  Ct Cervical Spine Wo Contrast  Result Date: 04/15/2018 CLINICAL DATA:  Patient passed out 5 days ago and fell. Posterior neck pain. EXAM: CT HEAD WITHOUT CONTRAST CT CERVICAL SPINE WITHOUT CONTRAST TECHNIQUE: Multidetector CT imaging of the head and cervical spine was performed following the standard protocol without intravenous contrast. Multiplanar CT image reconstructions of the cervical spine were also generated. COMPARISON:  MRI brain 12/20/2017. CT head 12/19/2017. CT head and cervical spine 10/02/2006 FINDINGS: CT HEAD FINDINGS Brain: Mild cerebral atrophy. Patchy low-attenuation changes in the deep white matter consistent with small vessel ischemic changes and old lacunar infarcts. Old area of encephalomalacia consistent with old infarct in the right superior cerebellum. No mass-effect or midline shift. No abnormal extra-axial fluid collections. Gray-white matter junctions are distinct. Basal cisterns are not effaced. No acute intracranial hemorrhage. Vascular: Moderate intracranial arterial vascular calcifications are present. Skull: Calvarium appears intact. No acute depressed skull fractures. Sinuses/Orbits: Paranasal sinuses and mastoid air cells are clear. Other: None. CT CERVICAL SPINE FINDINGS Alignment: Normal alignment of the cervical spine and facet joints. C1-2 articulation appears intact. Skull base and vertebrae: Skull base appears intact. No vertebral compression deformities. No focal bone lesion or bone destruction. Soft tissues and spinal canal: No prevertebral soft tissue swelling. No abnormal paraspinal soft tissue mass or infiltration. Disc levels: Postoperative changes with anterior screw fixation and  intervertebral fusion at C3-4 and from C5-C7. Fused segments appear intact. Hardware appears intact. Degenerative changes with disc space narrowing and endplate hypertrophic changes at C7-T1 and T1-2. Degenerative changes at C1-2. Degenerative changes in the facet joints. Upper chest: Lung apices are clear. Other: None. IMPRESSION: 1. No acute intracranial abnormalities. Chronic atrophy and small vessel ischemic changes. 2. Normal alignment of the cervical spine. Postoperative changes with anterior screw fixation and intervertebral fusion at C3-4 and C5-C7. No acute displaced fractures identified. Electronically Signed   By: Lucienne Capers M.D.   On: 04/15/2018 06:36   Dg Knee Complete 4 Views Left  Result Date: 04/15/2018 CLINICAL DATA:  Left knee pain for 5 days. EXAM: LEFT KNEE - COMPLETE 4+ VIEW COMPARISON:  Left knee radiograph 11/02/2016 FINDINGS: Mild degenerative change of the left knee. No fracture or dislocation. No joint effusion. There are vascular calcifications.  IMPRESSION: Mild left knee osteoarthrosis without acute abnormality. Electronically Signed   By: Ulyses Jarred M.D.   On: 04/15/2018 06:44    Procedures .Critical Care Performed by: Rolland Porter, MD Authorized by: Rolland Porter, MD   Critical care provider statement:    Critical care time (minutes):  33   Critical care was necessary to treat or prevent imminent or life-threatening deterioration of the following conditions:  Endocrine crisis   Critical care was time spent personally by me on the following activities:  Discussions with consultants, examination of patient, obtaining history from patient or surrogate, ordering and review of laboratory studies, ordering and review of radiographic studies, pulse oximetry, re-evaluation of patient's condition and review of old charts   (including critical care time)   Medications Ordered in ED Medications  sodium chloride 0.9 % bolus 1,000 mL (1,000 mLs Intravenous New Bag/Given  04/15/18 0708)  potassium chloride SA (K-DUR,KLOR-CON) CR tablet 40 mEq (has no administration in time range)  potassium chloride 10 mEq in 100 mL IVPB (10 mEq Intravenous New Bag/Given 04/15/18 0717)  fentaNYL (SUBLIMAZE) injection 25 mcg (25 mcg Intravenous Given 04/15/18 0643)     Initial Impression / Assessment and Plan / ED Course  I have reviewed the triage vital signs and the nursing notes.  Pertinent labs & imaging results that were available during my care of the patient were reviewed by me and considered in my medical decision making (see chart for details).   Patient is very tender to palpation of her neck.  She is on Coumadin and hit her head.  CT of the brain and cervical spine was done.  Also x-rayed her left knee.  I was suspicious she may have a patella fracture or patellar rupture since she cannot straight leg raise her left knee.  Laboratory testing was done.  Patient did have a low-grade fever so blood cultures and urine culture was ordered.  Did not do a chest x-ray since she does not have any respiratory symptoms.  After reviewing her blood results patient was noted to have a very significant hypokalemia.  Since she is not having vomiting she was started on oral potassium and IV potassium.  She was given low-dose fentanyl for pain.  Patient was checked for rhabdomyolysis since she states she has been unable to walk since she fell.  When asked patient states she has been on the Lasix for a long time.  This is the most likely etiology of her hypokalemia.  However her most recent potassiums have been normal.  I gave patient her test results that have resulted so far, we discussed need for admission.  07:18 AM Dr Manuella Ghazi, hospitalist will admit.     Review of the [place] shows patient got #90 tramadol 50 mg tablets about twice a year, last filled August 13, she states she has not run out, #30 temazepam 15 mg tablets filled on August 13 and October 3.  Final  Clinical Impressions(s) / ED Diagnoses   Final diagnoses:  Weakness  Fall in home, initial encounter  Syncope, unspecified syncope type  Acute pain of left knee  Hypokalemia  Fever, unspecified fever cause    Plan admission  Rolland Porter, MD, Barbette Or, MD 04/15/18 [phone]

## 2018-04-15 NOTE — Progress Notes (Addendum)
PT unable to complete Orthostatic VS due to not being able to sit or stand because of pain from torn meniscus in Left knee. Please continue order until completed and PT is able to tolerate pain and weight bearing.

## 2018-04-15 NOTE — Progress Notes (Signed)
ANTICOAGULATION CONSULT NOTE - Initial Consult  Pharmacy Consult for  Warfarin dosing Indication: atrial fibrillation  Home Dose:  warfarin 2.5mg  on Sun-Thurs-Sat; warfarin 5mg  on Mon-Tues-Wed-Fri Last dose: warfarin 5mg  on 10-13 Concurrent anti-platelet drugs: aspirin 81mg  daily Drug Interactions: no significant interactions  Allergies  Allergen Reactions  . Iodinated Diagnostic Agents Anaphylaxis and Other (See Comments)    IPD dye Info given by patient  . Ioxaglate Anaphylaxis and Other (See Comments)    Info given by patient  . Red Dye Anaphylaxis  . Lactose Intolerance (Gi)   . Whey Other (See Comments)    Lactose intolerance  . Darvon [Propoxyphene] Rash  . Hydralazine Anxiety and Other (See Comments)    Facial flushing, pt prefers not to use it.   . Milk-Related Compounds Other (See Comments)    Lactose intolerance Can tolerate milk if its cooked into the recipe, just can't drink milk     Patient Measurements: Height: 5\' 2"  (157.5 cm) Weight: 110 lb 7.2 oz (50.1 kg) IBW/kg (Calculated) : 50.1 Heparin Dosing Weight: HEPARIN DW (KG): 50.1  Vital Signs: Temp: 98.2 F (36.8 C) (10/14 0900) Temp Source: Oral (10/14 0900) BP: 181/74 (10/14 0900) Pulse Rate: 95 (10/14 0900)  Labs: Recent Labs    04/15/18 0639  HGB 13.2  HCT 38.4  PLT 172  LABPROT 26.1*  INR 2.41  CREATININE 0.81  CKTOTAL 22*  TROPONINI <0.03    Estimated Creatinine Clearance: 45.3 mL/min (by C-G formula based on SCr of 0.81 mg/dL).   Medical History: Past Medical History:  Diagnosis Date  . Anemia 2005   Generally microcytic, transfusions in [address], 2012, 02/2015, 05/2015  . Aortic stenosis    Moderate November 2017  . Arthritis   . Broken back 2013   Chronic back pain.   [name] CAD (coronary artery disease)    Cardiac catheterization 2014 - 80% mid RCA and 70% OM managed medically  . CHF (congestive heart failure) ([place])   . Chronic bilateral pleural effusions   . Chronic headache   .  Contrast media allergy   . Diastolic heart failure ([place])   . Esophageal cancer ([place]) 05/06/2015   Adenocarcinoma GE junction  . Facial paresthesia   . GERD (gastroesophageal reflux disease)   . H. pylori infection   . H/O iron deficiency    05-06-15 iron infusion ([facility])  . HH (hiatus hernia) 2008   Large with associated erosions  . Hypertension   . Paroxysmal atrial fibrillation ([name])   . PONV (postoperative nausea and vomiting)   . Pulmonary emboli ([place]) 2008, 2012  . Stroke [facility]) 1982     Assessment: Pharmacy consulted to dose warfarin for this 34 yof on chronic warfarin anticoagulation for paroxysmal atrial fibrillation. Patient takes  warfarin 2.5mg  on Sun-Thurs-Sat and  warfarin 5mg  on Mon-Tues-Wed-Fri. Her INR today of 2.41 is within desired therapeutic goal range.  Goal of Therapy:  INR 2-3 Monitor platelets by anticoagulation protocol: Yes   Plan:  Give warfarin 2.5mg  x1 dose today (home dose) Pharmacy will continue to monitor appropriate labs and for signs of bleeding.  Despina Pole 04/15/2018,12:10 PM

## 2018-04-15 NOTE — ED Triage Notes (Signed)
Pt reports that she passed out 5 days ago X2,  Reports that she had soreness to her left knee that has gotten worse since then, c/o left knee pain, posterior neck area and lower back.

## 2018-04-15 NOTE — H&P (Signed)
History and Physical    [name] DOB: [birth year] DOA: 04/15/2018  PCP: Dorothyann Peng, NP   Patient coming from: Home  Chief Complaint: Left knee pain  HPI: [name] is a 77 y.o. female with medical history significant for coronary artery disease, chronic diastolic CHF, PE x2 on warfarin for anticoagulation, paroxysmal atrial fibrillation, aortic stenosis, dyslipidemia, adenocarcinoma of the GE junction, bilateral carotid artery stenosis, essential hypertension, prior admission and discharge on 02/18/2018 with suspicion of colitis-likely ischemic, and chronic pain who apparently had a syncopal episode on 10/9 while walking to her bathroom twice in the same day.  She states that she was nauseated prior to the fall and became clammy and diaphoretic.  She states that she fell and twisted her left knee at that time and states that it has been painful ever since.  She is unable to bear weight on the knee since she fell and states that she could make it to the ED when this first happened due to the severity of pain in her knee.  She states she hit her head and had a lot of pain in her posterior neck region and that this has gotten some worse.  Her knee has also become swollen and is painful to touch.  She has tried placing lidocaine patches with little relief noted.  She typically ambulates with her walker and takes her medications as otherwise prescribed. She denies any chest pain, lightheadedness, palpitations, orthopnea, lower extremity swelling, or other complaints.   ED Course: Temperature in the ED noted to be 100 Fahrenheit and vital signs are otherwise stable.  Laboratory data remarkable for potassium of 2.5, glucose was 188 and INR is therapeutic at 2.4.  CT scan of head and neck as well as x-ray of the left knee are unremarkable.  She has received 40 mEq of IV and oral potassium supplementation with 1 L IV fluid bolus as well as fentanyl for pain relief.  Review of  Systems: All others reviewed and otherwise negative.  Past Medical History:  Diagnosis Date  . Anemia 2005   Generally microcytic, transfusions in [address], 2012, 02/2015, 05/2015  . Aortic stenosis    Moderate November 2017  . Arthritis   . Broken back 2013   Chronic back pain.   [name] CAD (coronary artery disease)    Cardiac catheterization 2014 - 80% mid RCA and 70% OM managed medically  . CHF (congestive heart failure) ([place])   . Chronic bilateral pleural effusions   . Chronic headache   . Contrast media allergy   . Diastolic heart failure ([place])   . Esophageal cancer ([place]) 05/06/2015   Adenocarcinoma GE junction  . Facial paresthesia   . GERD (gastroesophageal reflux disease)   . H. pylori infection   . H/O iron deficiency    05-06-15 iron infusion ([facility])  . HH (hiatus hernia) 2008   Large with associated erosions  . Hypertension   . Paroxysmal atrial fibrillation ([name])   . PONV (postoperative nausea and vomiting)   . Pulmonary emboli ([place]) 2008, 2012  . Stroke [facility]) 1982    Past Surgical History:  Procedure Laterality Date  . APPENDECTOMY  1950s  . BACK SURGERY    . CARPAL TUNNEL RELEASE Bilateral 1990s  . [place] SURGERY  2014  . CHOLECYSTECTOMY  1980s   open  . COLONOSCOPY N/A 02/17/2015   Procedure: COLONOSCOPY;  Surgeon: [name], MD;  Location: [facility] ENDOSCOPY;  Service: Endoscopy;  Laterality: N/A;  .  ESOPHAGOGASTRODUODENOSCOPY N/A 02/16/2015   Procedure: ESOPHAGOGASTRODUODENOSCOPY (EGD);  Surgeon: [name], MD;  Location: [facility] ENDOSCOPY;  Service: Endoscopy;  Laterality: N/A;  . ESOPHAGOGASTRODUODENOSCOPY N/A 05/06/2015   Procedure: ESOPHAGOGASTRODUODENOSCOPY (EGD);  Surgeon: [name], MD;  Location: [facility] ENDOSCOPY;  Service: Endoscopy;  Laterality: N/A;  . EUS N/A 05/20/2015   Procedure: UPPER ENDOSCOPIC ULTRASOUND (EUS) LINEAR;  Surgeon: [name], MD;  Location: [facility] ENDOSCOPY;  Service: Endoscopy;  Laterality: N/A;  . exploratory lab  1950s or  1960s  . GIVENS CAPSULE STUDY N/A 05/06/2015   Procedure: GIVENS CAPSULE STUDY;  Surgeon: [name], MD;  Location: [facility] ENDOSCOPY;  Service: Endoscopy;  Laterality: N/A;  . LEFT HEART CATH AND CORONARY ANGIOGRAPHY N/A 11/08/2016   Procedure: Left Heart Cath and Coronary Angiography;  Surgeon: [name], MD;  Location: [facility] CV LAB;  Service: Cardiovascular;  Laterality: N/A;  . lumbar back surgery  2012  . [place] SURGERY  1991  . TONSILLECTOMY    . TONSILLECTOMY AND ADENOIDECTOMY  1960s     reports that she has never smoked. She has never used smokeless tobacco. She reports that she does not drink alcohol or use drugs.  Allergies  Allergen Reactions  . Iodinated Diagnostic Agents Anaphylaxis and Other (See Comments)    IPD dye Info given by patient  . Ioxaglate Anaphylaxis and Other (See Comments)    Info given by patient  . Red Dye Anaphylaxis  . Lactose Intolerance (Gi)   . Whey Other (See Comments)    Lactose intolerance  . Darvon [Propoxyphene] Rash  . Hydralazine Anxiety and Other (See Comments)    Facial flushing, pt prefers not to use it.   . Milk-Related Compounds Other (See Comments)    Lactose intolerance Can tolerate milk if its cooked into the recipe, just can't drink milk     Family History  Problem Relation Age of Onset  . Stroke Mother   . Heart disease Mother   . Emphysema Father   . Ovarian cancer Sister   . Stroke Sister   . Other Child        died at birth    Prior to Admission medications   Medication Sig Start Date End Date Taking? Authorizing Provider  acetaminophen (TYLENOL) 500 MG tablet Take 500-1,000 mg by mouth daily as needed for mild pain or moderate pain.     [provider]  aspirin EC 81 MG tablet Take 1 tablet (81 mg total) by mouth daily. 11/02/16   Hilty, Nadean Corwin, MD  carvedilol (COREG) 25 MG tablet TAKE (1) TABLET BY MOUTH TWICE DAILY WITH A MEAL. 04/03/18   Nafziger, Tommi Rumps, NP  cyanocobalamin (,VITAMIN B-12,)  1000 MCG/ML injection IM daily 11/29-12/3, then weekly x4, then monthly. 05/30/16   Samuella Cota, MD  ezetimibe-simvastatin (VYTORIN) 10-20 MG tablet Take 1 tablet by mouth daily. 09/10/17   Hilty, Nadean Corwin, MD  ferrous sulfate 325 (65 FE) MG tablet Take 325 mg by mouth every evening.     [provider]  furosemide (LASIX) 40 MG tablet TAKE 1 TABLET BY MOUTH ONCE DAILY. 11/13/17   Nafziger, Tommi Rumps, NP  gabapentin (NEURONTIN) 300 MG capsule TAKE 1 CAPSULE BY MOUTH THREE TIMES A DAY. Patient taking differently: Take 300 mg by mouth daily as needed (for pain). PRESCRIBED 1 CAPSULE BY MOUTH THREE TIMES A DAY. 09/05/17   Nafziger, Tommi Rumps, NP  meclizine (ANTIVERT) 25 MG tablet Take 25 mg by mouth as needed for  dizziness or nausea.  08/28/14   [provider]  ondansetron (ZOFRAN) 4 MG tablet Take 1 tablet (4 mg total) by mouth every 8 (eight) hours as needed for nausea or vomiting. 02/19/18   Nafziger, Tommi Rumps, NP  pantoprazole (PROTONIX) 40 MG tablet TAKE 1 TABLET BY MOUTH TWICE DAILY. 12/05/17   Dorothyann Peng, NP  Pediatric Multivitamins-Iron (FLINTSTONES PLUS IRON) chewable tablet Chew 2 tablets by mouth daily. 05/30/16   Samuella Cota, MD  promethazine (PHENERGAN) 12.5 MG tablet Take 1 tablet (12.5 mg total) by mouth every 8 (eight) hours as needed for nausea or vomiting. 02/21/18   Nafziger, Tommi Rumps, NP  temazepam (RESTORIL) 15 MG capsule Take 1 capsule (15 mg total) by mouth at bedtime. 04/04/18 05/04/18  Nafziger, Tommi Rumps, NP  traMADol (ULTRAM) 50 MG tablet TAKE (1) TABLET BY MOUTH EVERY EIGHT HOURS. 02/12/18   Nafziger, Tommi Rumps, NP  warfarin (COUMADIN) 5 MG tablet TAKE 1/2 TABLET BY MOUTH ON WEDNESDAY AND 1 TABLET ALL OTHER DAYS OR AS DIRECTED BY ANTICOAGULATION CLINIC. Patient taking differently: Take 2.5-5 mg by mouth See admin instructions. Sunday, Thursday, and Sat take a whole tablet. Rest of week take a 1/2 tablet. 12/07/17   Dorothyann Peng, NP    Physical Exam: Vitals:   04/15/18 0529  04/15/18 0531 04/15/18 0644 04/15/18 0751  BP:  (!) 145/79 (!) 143/63   Pulse:  (!) 107 87   Resp:  16 19   Temp:  100 F (37.8 C)  100.2 F (37.9 C)  TempSrc:  Oral  Rectal  SpO2:  95% 94%   Weight: 48.5 kg     Height: 5\' 2"  (1.575 m)       Constitutional: NAD, calm, comfortable Vitals:   04/15/18 0529 04/15/18 0531 04/15/18 0644 04/15/18 0751  BP:  (!) 145/79 (!) 143/63   Pulse:  (!) 107 87   Resp:  16 19   Temp:  100 F (37.8 C)  100.2 F (37.9 C)  TempSrc:  Oral  Rectal  SpO2:  95% 94%   Weight: 48.5 kg     Height: 5\' 2"  (1.575 m)      Eyes: lids and conjunctivae normal ENMT: Mucous membranes are dry. Neck: normal, supple Respiratory: clear to auscultation bilaterally. Normal respiratory effort. No accessory muscle use.  Currently on 3 L nasal cannula. Cardiovascular: Regular rate and rhythm, no murmurs. No extremity edema. Abdomen: no tenderness, no distention. Bowel sounds positive.  Musculoskeletal:  No joint deformity upper and lower extremities.  Left knee with severe pain to touch and swelling noted.  No erythema or bruising appreciated. Skin: no rashes, lesions, ulcers.  Psychiatric: Normal judgment and insight. Alert and oriented x 3. Normal mood.   Labs on Admission: I have personally reviewed following labs and imaging studies  CBC: Recent Labs  Lab 04/15/18 0639  WBC 7.5  NEUTROABS 4.8  HGB 13.2  HCT 38.4  MCV 84.4  PLT 644   Basic Metabolic Panel: Recent Labs  Lab 04/15/18 0639  NA 135  K 2.5*  CL 90*  CO2 36*  GLUCOSE 188*  BUN 12  CREATININE 0.81  CALCIUM 8.7*  MG 2.0   GFR: Estimated Creatinine Clearance: 43.8 mL/min (by C-G formula based on SCr of 0.81 mg/dL). Liver Function Tests: Recent Labs  Lab 04/15/18 0639  AST 13*  ALT 10  ALKPHOS 57  BILITOT 1.6*  PROT 6.7  ALBUMIN 3.2*   No results for input(s): LIPASE, AMYLASE in the last 168 hours. No results  for input(s): AMMONIA in the last 168 hours. Coagulation  Profile: Recent Labs  Lab 04/15/18 0639  INR 2.41   Cardiac Enzymes: Recent Labs  Lab 04/15/18 0639  CKTOTAL 22*  TROPONINI <0.03   BNP (last 3 results) Recent Labs    03/07/18 0939  PROBNP 268.0*   HbA1C: No results for input(s): HGBA1C in the last 72 hours. CBG: No results for input(s): GLUCAP in the last 168 hours. Lipid Profile: No results for input(s): CHOL, HDL, LDLCALC, TRIG, CHOLHDL, LDLDIRECT in the last 72 hours. Thyroid Function Tests: No results for input(s): TSH, T4TOTAL, FREET4, T3FREE, THYROIDAB in the last 72 hours. Anemia Panel: No results for input(s): VITAMINB12, FOLATE, FERRITIN, TIBC, IRON, RETICCTPCT in the last 72 hours. Urine analysis:    Component Value Date/Time   COLORURINE YELLOW 02/15/2018 1549   APPEARANCEUR CLEAR 02/15/2018 1549   LABSPEC 1.016 02/15/2018 1549   PHURINE 6.0 02/15/2018 1549   GLUCOSEU NEGATIVE 02/15/2018 1549   HGBUR MODERATE (A) 02/15/2018 1549   BILIRUBINUR NEGATIVE 02/15/2018 1549   BILIRUBINUR n 01/29/2018 1029   KETONESUR NEGATIVE 02/15/2018 1549   PROTEINUR NEGATIVE 02/15/2018 1549   UROBILINOGEN 0.2 01/29/2018 1029   UROBILINOGEN 0.2 05/04/2015 2308   NITRITE NEGATIVE 02/15/2018 1549   LEUKOCYTESUR NEGATIVE 02/15/2018 1549    Radiological Exams on Admission: Ct Head Wo Contrast  Result Date: 04/15/2018 CLINICAL DATA:  Patient passed out 5 days ago and fell. Posterior neck pain. EXAM: CT HEAD WITHOUT CONTRAST CT CERVICAL SPINE WITHOUT CONTRAST TECHNIQUE: Multidetector CT imaging of the head and cervical spine was performed following the standard protocol without intravenous contrast. Multiplanar CT image reconstructions of the cervical spine were also generated. COMPARISON:  MRI brain 12/20/2017. CT head 12/19/2017. CT head and cervical spine 10/02/2006 FINDINGS: CT HEAD FINDINGS Brain: Mild cerebral atrophy. Patchy low-attenuation changes in the deep white matter consistent with small vessel ischemic changes and  old lacunar infarcts. Old area of encephalomalacia consistent with old infarct in the right superior cerebellum. No mass-effect or midline shift. No abnormal extra-axial fluid collections. Gray-white matter junctions are distinct. Basal cisterns are not effaced. No acute intracranial hemorrhage. Vascular: Moderate intracranial arterial vascular calcifications are present. Skull: Calvarium appears intact. No acute depressed skull fractures. Sinuses/Orbits: Paranasal sinuses and mastoid air cells are clear. Other: None. CT CERVICAL SPINE FINDINGS Alignment: Normal alignment of the cervical spine and facet joints. C1-2 articulation appears intact. Skull base and vertebrae: Skull base appears intact. No vertebral compression deformities. No focal bone lesion or bone destruction. Soft tissues and spinal canal: No prevertebral soft tissue swelling. No abnormal paraspinal soft tissue mass or infiltration. Disc levels: Postoperative changes with anterior screw fixation and intervertebral fusion at C3-4 and from C5-C7. Fused segments appear intact. Hardware appears intact. Degenerative changes with disc space narrowing and endplate hypertrophic changes at C7-T1 and T1-2. Degenerative changes at C1-2. Degenerative changes in the facet joints. Upper chest: Lung apices are clear. Other: None. IMPRESSION: 1. No acute intracranial abnormalities. Chronic atrophy and small vessel ischemic changes. 2. Normal alignment of the cervical spine. Postoperative changes with anterior screw fixation and intervertebral fusion at C3-4 and C5-C7. No acute displaced fractures identified. Electronically Signed   By: Lucienne Capers M.D.   On: 04/15/2018 06:36   Ct Cervical Spine Wo Contrast  Result Date: 04/15/2018 CLINICAL DATA:  Patient passed out 5 days ago and fell. Posterior neck pain. EXAM: CT HEAD WITHOUT CONTRAST CT CERVICAL SPINE WITHOUT CONTRAST TECHNIQUE: Multidetector CT imaging of the head and  cervical spine was performed  following the standard protocol without intravenous contrast. Multiplanar CT image reconstructions of the cervical spine were also generated. COMPARISON:  MRI brain 12/20/2017. CT head 12/19/2017. CT head and cervical spine 10/02/2006 FINDINGS: CT HEAD FINDINGS Brain: Mild cerebral atrophy. Patchy low-attenuation changes in the deep white matter consistent with small vessel ischemic changes and old lacunar infarcts. Old area of encephalomalacia consistent with old infarct in the right superior cerebellum. No mass-effect or midline shift. No abnormal extra-axial fluid collections. Gray-white matter junctions are distinct. Basal cisterns are not effaced. No acute intracranial hemorrhage. Vascular: Moderate intracranial arterial vascular calcifications are present. Skull: Calvarium appears intact. No acute depressed skull fractures. Sinuses/Orbits: Paranasal sinuses and mastoid air cells are clear. Other: None. CT CERVICAL SPINE FINDINGS Alignment: Normal alignment of the cervical spine and facet joints. C1-2 articulation appears intact. Skull base and vertebrae: Skull base appears intact. No vertebral compression deformities. No focal bone lesion or bone destruction. Soft tissues and spinal canal: No prevertebral soft tissue swelling. No abnormal paraspinal soft tissue mass or infiltration. Disc levels: Postoperative changes with anterior screw fixation and intervertebral fusion at C3-4 and from C5-C7. Fused segments appear intact. Hardware appears intact. Degenerative changes with disc space narrowing and endplate hypertrophic changes at C7-T1 and T1-2. Degenerative changes at C1-2. Degenerative changes in the facet joints. Upper chest: Lung apices are clear. Other: None. IMPRESSION: 1. No acute intracranial abnormalities. Chronic atrophy and small vessel ischemic changes. 2. Normal alignment of the cervical spine. Postoperative changes with anterior screw fixation and intervertebral fusion at C3-4 and C5-C7. No  acute displaced fractures identified. Electronically Signed   By: Lucienne Capers M.D.   On: 04/15/2018 06:36   Dg Knee Complete 4 Views Left  Result Date: 04/15/2018 CLINICAL DATA:  Left knee pain for 5 days. EXAM: LEFT KNEE - COMPLETE 4+ VIEW COMPARISON:  Left knee radiograph 11/02/2016 FINDINGS: Mild degenerative change of the left knee. No fracture or dislocation. No joint effusion. There are vascular calcifications. IMPRESSION: Mild left knee osteoarthrosis without acute abnormality. Electronically Signed   By: Ulyses Jarred M.D.   On: 04/15/2018 06:44    EKG: Independently reviewed.  Atrial fibrillation at 96 bpm.  Assessment/Plan Principal Problem:   Syncope and collapse Active Problems:   PAF (paroxysmal atrial fibrillation) ([name])   Chronic diastolic congestive heart failure ([name])   Mixed hyperlipidemia   GERD (gastroesophageal reflux disease)   Anticoagulated on Coumadin   Aortic valve stenosis   Essential hypertension, benign   CAD (coronary artery disease)   Hypokalemia    1. Syncope likely secondary to hypokalemia.  We will plan to replete potassium and this has already been ordered with 80 mEq replacement in ED.  Check magnesium and labs in a.m.  This is likely secondary to ongoing Lasix use at home with no potassium supplementation.  Monitor on telemetry.  Patient has had 2D echocardiogram recently on 01/2018 with LVEF 50 to 55% and she is noted to have moderate to severe bilateral carotid artery stenosis which was noted on 12/2017.  This may be related to the problem, and can be explored further in the outpatient setting once potassium is repleted.  Urine analysis is still pending as well.  Fall precautions and PT evaluation. 2. Left knee pain secondary to fall.  Patient is having severe pain with no findings on imaging initially.  We will plan to order CT of the knee as I suspect a possible patellar fracture.  PT evaluation.  Pain  management with oral narcotic medications as  needed. 3. Paroxysmal atrial fibrillation.  Noted on EKG.  Monitor on telemetry as patient is currently rate controlled.  Continue carvedilol and warfarin for anticoagulation. 4. History of PE.  Continue warfarin. 5. Dyslipidemia.  Continue Vytorin. 6. Chronic diastolic CHF.  Appears to be euvolemic currently.  Continue home furosemide dose and monitor daily weights. 7. History of CAD.  Continue Vytorin, Coreg, and aspirin. 8. GERD.  Continue PPI.   DVT prophylaxis: Coumadin Code Status: Full Family Communication: None currently at bedside Disposition Plan: Potassium supplementation, PT evaluation Consults called: None Admission status: Inpatient, telemetry   [facility] Pager [phone]  If 7PM-7AM, please contact night-coverage [email or web address] Password [email or web address]  04/15/2018, 8:59 AM

## 2018-04-15 NOTE — Evaluation (Signed)
Physical Therapy Evaluation Patient Details Name: [name] MRN: [id number] DOB: Sep 04, 1940 Today's Date: 04/15/2018   History of Present Illness  Patient is a 77 year old female admitted 04/15/2018 after a fall 04/10/2018 at home after a syncopal episode. PMH: a-fib, CHF, HLD, GERD, HTN, CAD, aortic valve stensosis, PE.     Clinical Impression  Upon arriving in patient's room, patient stated she needed to use the bed pan to toilet. Patient required mod assistance to roll right to place bed pan. Patient had significant pain complaints with any movement or position change and is unable to tolerate therapist movement of left LE.  Patient unable to tolerate transfers or ambulation assessment today. Patient request additional pain medicine if able. Nurse reported patient unable to receive more pain medication at the time of the evaluation. Patient would continue to benefit from skilled physical therapy in current environment and next venue to continue return to prior function and increase strength, endurance, balance, coordination, and functional mobility and gait skills.      Follow Up Recommendations SNF;Supervision/Assistance - 24 hour    Equipment Recommendations  None recommended by PT    Recommendations for Other Services       Precautions / Restrictions Precautions Precautions: Fall Restrictions Weight Bearing Restrictions: No      Mobility  Bed Mobility Overal bed mobility: Needs Assistance Bed Mobility: Rolling Rolling: Mod assist         General bed mobility comments: left leg very painful with movement, extra time needed  Transfers                 General transfer comment: not attempted today due to pain complaints  Ambulation/Gait             General Gait Details: not attempted today due to pain complaints  Stairs            Wheelchair Mobility    Modified Rankin (Stroke Patients Only)       Balance                                              Pertinent Vitals/Pain Pain Assessment: Faces Faces Pain Scale: Hurts worst Pain Location: left hip and left knee Pain Intervention(s): Limited activity within patient's tolerance;Monitored during session;Patient requesting pain meds-RN notified    Home Living Family/patient expects to be discharged to:: Private residence Living Arrangements: Children(son, grandson) Available Help at Discharge: Family Type of Home: House Home Access: Stairs to enter Entrance Stairs-Rails: None Entrance Stairs-Number of Steps: 6 Home Layout: One level Home Equipment: Shower seat;Bedside commode;Other (comment);Walker - 4 wheels      Prior Function Level of Independence: Independent with assistive device(s);Needs assistance      ADL's / Homemaking Assistance Needed: Independent with dressing and bathing. Assistance for cooking, cleaning and laundry. Is still driving.        Hand Dominance   Dominant Hand: Right    Extremity/Trunk Assessment   Upper Extremity Assessment Upper Extremity Assessment: Generalized weakness    Lower Extremity Assessment Lower Extremity Assessment: Generalized weakness       Communication   Communication: No difficulties  Cognition Arousal/Alertness: Awake/alert Behavior During Therapy: WFL for tasks assessed/performed Overall Cognitive Status: Within Functional Limits for tasks assessed  General Comments      Exercises     Assessment/Plan    PT Assessment Patient needs continued PT services  PT Problem List Decreased mobility;Decreased activity tolerance;Pain       PT Treatment Interventions DME instruction;Therapeutic activities;Gait training;Therapeutic exercise;Patient/family education;Balance training;Neuromuscular re-education;Manual techniques    PT Goals (Current goals can be found in the Care Plan section)  Acute Rehab PT Goals Patient Stated  Goal: not be in so much pain; go home PT Goal Formulation: With patient Time For Goal Achievement: 04/29/18 Potential to Achieve Goals: Fair    Frequency Min 2X/week   Barriers to discharge        Co-evaluation               AM-PAC PT "6 Clicks" Daily Activity  Outcome Measure Difficulty turning over in bed (including adjusting bedclothes, sheets and blankets)?: A Lot Difficulty moving from lying on back to sitting on the side of the bed? : Unable Difficulty sitting down on and standing up from a chair with arms (e.g., wheelchair, bedside commode, etc,.)?: Unable Help needed moving to and from a bed to chair (including a wheelchair)?: Total Help needed walking in hospital room?: Total Help needed climbing 3-5 steps with a railing? : Total 6 Click Score: 7    End of Session   Activity Tolerance: Patient limited by pain Patient left: in bed;with bed alarm set;with family/visitor present;with call bell/phone within reach Nurse Communication: Mobility status PT Visit Diagnosis: Pain;Difficulty in walking, not elsewhere classified (R26.2);Repeated falls (R29.6) Pain - Right/Left: Left Pain - part of body: Knee;Hip    Time: 7356-7014 PT Time Calculation (min) (ACUTE ONLY): 28 min   Charges:   PT Evaluation $PT Eval Moderate Complexity: 1 Mod PT Treatments $Therapeutic Activity: 8-22 mins        Floria Raveling. Hartnett-Rands, MS, PT Per [place] #[id number] 04/15/2018, 2:47 PM

## 2018-04-15 NOTE — ED Notes (Signed)
Dr. Shah at bedside.

## 2018-04-15 NOTE — Plan of Care (Signed)
  Problem: Acute Rehab PT Goals(only PT should resolve) Goal: Pt will Roll Supine to Side Outcome: Progressing Flowsheets (Taken 04/15/2018 1451) Pt will Roll Supine to Side: with min assist Goal: Pt Will Go Supine/Side To Sit Outcome: Progressing Flowsheets (Taken 04/15/2018 1451) Pt will go Supine/Side to Sit: with moderate assist Goal: Pt Will Go Sit To Supine/Side Outcome: Progressing Flowsheets (Taken 04/15/2018 1451) Pt will go Sit to Supine/Side: with moderate assist Goal: Patient Will Transfer Sit To/From Stand Outcome: Progressing Flowsheets (Taken 04/15/2018 1451) Patient will transfer sit to/from stand: with moderate assist Goal: Pt Will Transfer Bed To Chair/Chair To Bed Outcome: Progressing Flowsheets (Taken 04/15/2018 1451) Pt will Transfer Bed to Chair/Chair to Bed: with max assist Goal: Pt/caregiver will Perform Home Exercise Program Outcome: Progressing Flowsheets (Taken 04/15/2018 1451) Pt/caregiver will Perform Home Exercise Program: For increased ROM; For increased strengthening; With minimal assist   Floria Raveling. Hartnett-Rands, MS, PT Per [facility] [phone]

## 2018-04-16 ENCOUNTER — Inpatient Hospital Stay (HOSPITAL_COMMUNITY): Payer: Medicare Other

## 2018-04-16 DIAGNOSIS — I5032 Chronic diastolic (congestive) heart failure: Secondary | ICD-10-CM

## 2018-04-16 DIAGNOSIS — E876 Hypokalemia: Secondary | ICD-10-CM

## 2018-04-16 DIAGNOSIS — I350 Nonrheumatic aortic (valve) stenosis: Secondary | ICD-10-CM

## 2018-04-16 DIAGNOSIS — I480 Paroxysmal atrial fibrillation: Secondary | ICD-10-CM

## 2018-04-16 DIAGNOSIS — M2392 Unspecified internal derangement of left knee: Secondary | ICD-10-CM

## 2018-04-16 DIAGNOSIS — I10 Essential (primary) hypertension: Secondary | ICD-10-CM

## 2018-04-16 LAB — CBC
HCT: 36.8 % (ref 36.0–46.0)
HEMOGLOBIN: 12.5 g/dL (ref 12.0–15.0)
MCH: 28.9 pg (ref 26.0–34.0)
MCHC: 34 g/dL (ref 30.0–36.0)
MCV: 85.2 fL (ref 80.0–100.0)
PLATELETS: 190 10*3/uL (ref 150–400)
RBC: 4.32 MIL/uL (ref 3.87–5.11)
RDW: 12.1 % (ref 11.5–15.5)
WBC: 5.5 10*3/uL (ref 4.0–10.5)
nRBC: 0 % (ref 0.0–0.2)

## 2018-04-16 LAB — MAGNESIUM: Magnesium: 1.9 mg/dL (ref 1.7–2.4)

## 2018-04-16 LAB — URINALYSIS, ROUTINE W REFLEX MICROSCOPIC
Bacteria, UA: NONE SEEN
Bilirubin Urine: NEGATIVE
Glucose, UA: NEGATIVE mg/dL
KETONES UR: NEGATIVE mg/dL
Leukocytes, UA: NEGATIVE
Nitrite: NEGATIVE
PH: 6 (ref 5.0–8.0)
PROTEIN: NEGATIVE mg/dL
SPECIFIC GRAVITY, URINE: 1.011 (ref 1.005–1.030)

## 2018-04-16 LAB — BASIC METABOLIC PANEL
ANION GAP: 10 (ref 5–15)
BUN: 13 mg/dL (ref 8–23)
CHLORIDE: 99 mmol/L (ref 98–111)
CO2: 29 mmol/L (ref 22–32)
Calcium: 8.6 mg/dL — ABNORMAL LOW (ref 8.9–10.3)
Creatinine, Ser: 0.81 mg/dL (ref 0.44–1.00)
GFR calc Af Amer: >60 mL/min (ref >60)
GFR calc non Af Amer: >60 mL/min (ref >60)
Glucose, Bld: 159 mg/dL — ABNORMAL HIGH (ref 70–99)
POTASSIUM: 3.4 mmol/L — AB (ref 3.5–5.1)
Sodium: 138 mmol/L (ref 135–145)

## 2018-04-16 MED ORDER — WARFARIN SODIUM 2.5 MG PO TABS
5.0000 mg | ORAL_TABLET | Freq: Once | ORAL | Status: AC
  Administered 2018-04-16: 5 mg via ORAL
  Filled 2018-04-16: qty 2

## 2018-04-16 MED ORDER — MECLIZINE HCL 12.5 MG PO TABS: 25.0000 mg | ORAL_TABLET | ORAL | Status: DC | PRN

## 2018-04-16 NOTE — Progress Notes (Signed)
Physical Therapy Treatment Patient Details Name: [name] MRN: [id number] DOB: [birth year] Today's Date: 04/16/2018    History of Present Illness Patient is a 77 year old female admitted 04/15/2018 after a fall 04/10/2018 at home after a syncopal episode. PMH: a-fib, CHF, HLD, GERD, HTN, CAD, aortic valve stensosis, PE.     PT Comments    Patient given pain medication prior therapy and ace wrapped patient's left knee for comfort/support.  Patient demonstrates slow labored movement with much assistance to move LLE when sitting up at bedside, able to partially weight bear on LLE, mostly touchdown for balancing, limited to 10-12 very short steps while followed with w/c for safety, limited due to fatiguing of arms and tolerated sitting up in w/c after therapy.  Patient tolerated staying up in w/c with NT assisting after therapy.  Patient will benefit from continued physical therapy in hospital and recommended venue below to increase strength, balance, endurance for safe ADLs and gait.    Follow Up Recommendations  SNF;Supervision/Assistance - 24 hour;Supervision for mobility/OOB     Equipment Recommendations  None recommended by PT    Recommendations for Other Services       Precautions / Restrictions Precautions Precautions: Fall Restrictions Weight Bearing Restrictions: No    Mobility  Bed Mobility Overal bed mobility: Needs Assistance Bed Mobility: Supine to Sit     Supine to sit: Min assist     General bed mobility comments: required assistance to move LLE  Transfers Overall transfer level: Needs assistance Equipment used: Rolling walker (2 wheeled) Transfers: Sit to/from [facility] Sit to Stand: Min assist;Mod assist Stand pivot transfers: Min assist;Mod assist       General transfer comment: mostly touchdown weight bearing on LLE, slow labored movement  Ambulation/Gait Ambulation/Gait assistance: Mod assist Gait Distance (Feet): 10  Feet Assistive device: Rolling walker (2 wheeled) Gait Pattern/deviations: Decreased step length - left;Decreased stance time - left;Decreased stride length Gait velocity: slow   General Gait Details: demonstrates slow labored movement with poor tolerance for weightbearing on LLE, mostly touchdown and supported body weight mostly with BUE, limited due to fatiguing of arms and LLE pain   Stairs             Wheelchair Mobility    Modified Rankin (Stroke Patients Only)       Balance Overall balance assessment: Needs assistance Sitting-balance support: Feet unsupported;No upper extremity supported Sitting balance-Leahy Scale: Fair     Standing balance support: Bilateral upper extremity supported;During functional activity Standing balance-Leahy Scale: Poor Standing balance comment: fair/poor with RW                            Cognition Arousal/Alertness: Awake/alert Behavior During Therapy: WFL for tasks assessed/performed Overall Cognitive Status: Within Functional Limits for tasks assessed                                        Exercises Total Joint Exercises Ankle Circles/Pumps: Supine;AROM;Both;Strengthening;10 reps Quad Sets: Supine;AROM;Strengthening;Left;5 reps Heel Slides: Supine;AROM;AAROM;Both;Strengthening;10 reps    General Comments        Pertinent Vitals/Pain Pain Assessment: Faces Faces Pain Scale: Hurts whole lot Pain Location: left knee with movement Pain Descriptors / Indicators: Sore;Discomfort;Grimacing;Guarding Pain Intervention(s): Limited activity within patient's tolerance;Monitored during session;Premedicated before session    Home Living Family/patient expects to be discharged to:: Private residence  Prior Function            PT Goals (current goals can now be found in the care plan section) Acute Rehab PT Goals Patient Stated Goal: return home PT Goal Formulation: With  patient Time For Goal Achievement: 04/29/18 Potential to Achieve Goals: Fair Progress towards PT goals: Progressing toward goals    Frequency    Min 3X/week      PT Plan Current plan remains appropriate    Co-evaluation              AM-PAC PT "6 Clicks" Daily Activity  Outcome Measure  Difficulty turning over in bed (including adjusting bedclothes, sheets and blankets)?: A Little Difficulty moving from lying on back to sitting on the side of the bed? : A Lot Difficulty sitting down on and standing up from a chair with arms (e.g., wheelchair, bedside commode, etc,.)?: A Lot Help needed moving to and from a bed to chair (including a wheelchair)?: A Lot Help needed walking in hospital room?: A Lot Help needed climbing 3-5 steps with a railing? : Total 6 Click Score: 12    End of Session Equipment Utilized During Treatment: Gait belt Activity Tolerance: Patient tolerated treatment well;Patient limited by fatigue;Patient limited by pain Patient left: in chair;with nursing/sitter in room Nurse Communication: Mobility status;Other (comment)(Patient left with NT) PT Visit Diagnosis: Pain;Difficulty in walking, not elsewhere classified (R26.2);Repeated falls (R29.6);Muscle weakness (generalized) (M62.81);Unsteadiness on feet (R26.81) Pain - Right/Left: Left Pain - part of body: Knee     Time: 1030-1057 PT Time Calculation (min) (ACUTE ONLY): 27 min  Charges:  $Therapeutic Exercise: 8-22 mins $Therapeutic Activity: 8-22 mins                     2:28 PM, 04/16/18 Lonell Grandchild, MPT Physical Therapist with [facility] [phone] office [phone] mobile phone

## 2018-04-16 NOTE — Care Management (Addendum)
CM received consult for home health. Patient is currently active with [facility].  Patient has been recommended for SNF and per progression rounds will be having a orthopedic consult.  CSW aware of SNF recommendation. CM will sign off. Please re-consult if needed.

## 2018-04-16 NOTE — Consult Note (Addendum)
Patient ID: [name], female   DOB: 12/18/1940, 77 y.o.   MRN: [id number]  Requested by Dr. Jerilee Hoh Reason for consultation knee pain after fall left knee  Chief Complaint  Patient presents with  . Knee Pain    HPI [name] is a 77 y.o. female.  She became hypokalemic fell injured her left knee twisting complains of left knee pain x1 day.  She says the pain is severe she cannot bend or straighten her knee quality dull ache she cannot weight-bear and it is worse if she tries to bend or straighten the knee or put weight on the left knee   Review of Systems Review of Systems  Constitutional: Negative.   HENT: Negative for congestion and ear discharge.   Eyes: Negative for discharge.  Respiratory: Negative for cough.   Cardiovascular: Negative for chest pain.  Gastrointestinal: Negative for heartburn.  Genitourinary: Negative for dysuria.  Musculoskeletal: Positive for falls, joint pain and myalgias.  Skin: Negative.   Neurological: Positive for dizziness.  Endo/Heme/Allergies: Negative.   Psychiatric/Behavioral: Negative.    Family History  Problem Relation Age of Onset  . Stroke Mother   . Heart disease Mother   . Emphysema Father   . Ovarian cancer Sister   . Stroke Sister   . Other Child        died at birth      Past Medical History:  Diagnosis Date  . Anemia 2005   Generally microcytic, transfusions in [address], 2012, 02/2015, 05/2015  . Aortic stenosis    Moderate November 2017  . Arthritis   . Broken back 2013   Chronic back pain.   [name] CAD (coronary artery disease)    Cardiac catheterization 2014 - 80% mid RCA and 70% OM managed medically  . CHF (congestive heart failure) ([place])   . Chronic bilateral pleural effusions   . Chronic headache   . Contrast media allergy   . Diastolic heart failure ([place])   . Esophageal cancer ([place]) 05/06/2015   Adenocarcinoma GE junction  . Facial paresthesia   . GERD (gastroesophageal reflux disease)   . H.  pylori infection   . H/O iron deficiency    05-06-15 iron infusion ([facility])  . HH (hiatus hernia) 2008   Large with associated erosions  . Hypertension   . Paroxysmal atrial fibrillation ([name])   . PONV (postoperative nausea and vomiting)   . Pulmonary emboli ([place]) 2008, 2012  . Stroke [facility]) 1982     Past Surgical History:  Procedure Laterality Date  . APPENDECTOMY  1950s  . BACK SURGERY    . CARPAL TUNNEL RELEASE Bilateral 1990s  . [place] SURGERY  2014  . CHOLECYSTECTOMY  1980s   open  . COLONOSCOPY N/A 02/17/2015   Procedure: COLONOSCOPY;  Surgeon: [name], MD;  Location: [facility] ENDOSCOPY;  Service: Endoscopy;  Laterality: N/A;  . ESOPHAGOGASTRODUODENOSCOPY N/A 02/16/2015   Procedure: ESOPHAGOGASTRODUODENOSCOPY (EGD);  Surgeon: [name], MD;  Location: [facility];  Service: Endoscopy;  Laterality: N/A;  . ESOPHAGOGASTRODUODENOSCOPY N/A 05/06/2015   Procedure: ESOPHAGOGASTRODUODENOSCOPY (EGD);  Surgeon: [name], MD;  Location: [facility] ENDOSCOPY;  Service: Endoscopy;  Laterality: N/A;  . EUS N/A 05/20/2015   Procedure: UPPER ENDOSCOPIC ULTRASOUND (EUS) LINEAR;  Surgeon: [name], MD;  Location: [facility] ENDOSCOPY;  Service: Endoscopy;  Laterality: N/A;  . exploratory lab  1950s or 1960s  . GIVENS CAPSULE STUDY N/A 05/06/2015   Procedure: GIVENS CAPSULE STUDY;  Surgeon: [name],  MD;  Location: [facility] ENDOSCOPY;  Service: Endoscopy;  Laterality: N/A;  . LEFT HEART CATH AND CORONARY ANGIOGRAPHY N/A 11/08/2016   Procedure: Left Heart Cath and Coronary Angiography;  Surgeon: [name], MD;  Location: [facility] CV LAB;  Service: Cardiovascular;  Laterality: N/A;  . lumbar back surgery  2012  . [place] SURGERY  1991  . TONSILLECTOMY    . TONSILLECTOMY AND ADENOIDECTOMY  1960s     Social History  Social History   Tobacco Use  . Smoking status: Never Smoker  . Smokeless tobacco: Never Used  Substance Use Topics  . Alcohol use: No    Alcohol/week: 0.0  standard drinks  . Drug use: No    Allergies  Allergen Reactions  . Iodinated Diagnostic Agents Anaphylaxis and Other (See Comments)    IPD dye Info given by patient  . Ioxaglate Anaphylaxis and Other (See Comments)    Info given by patient  . Red Dye Anaphylaxis  . Lactose Intolerance (Gi)   . Whey Other (See Comments)    Lactose intolerance  . Darvon [Propoxyphene] Rash  . Hydralazine Anxiety and Other (See Comments)    Facial flushing, pt prefers not to use it.   . Milk-Related Compounds Other (See Comments)    Lactose intolerance Can tolerate milk if its cooked into the recipe, just can't drink milk     Current Facility-Administered Medications  Medication Dose Route Frequency Provider Last Rate Last Dose  . acetaminophen (TYLENOL) tablet 650 mg  650 mg Oral Q6H PRN Heath Lark D, DO   650 mg at 04/16/18 0177   Or  . acetaminophen (TYLENOL) suppository 650 mg  650 mg Rectal Q6H PRN Manuella Ghazi, Pratik D, DO      . aspirin EC tablet 81 mg  81 mg Oral Daily Manuella Ghazi, Pratik D, DO   81 mg at 04/16/18 1022  . carvedilol (COREG) tablet 25 mg  25 mg Oral BID WC Shah, Pratik D, DO   25 mg at 04/16/18 1027  . ezetimibe (ZETIA) tablet 10 mg  10 mg Oral Daily Manuella Ghazi, Pratik D, DO   10 mg at 04/16/18 1023  . ferrous sulfate tablet 325 mg  325 mg Oral QPM Manuella Ghazi, Pratik D, DO   325 mg at 04/15/18 1846  . furosemide (LASIX) tablet 40 mg  40 mg Oral Daily Manuella Ghazi, Pratik D, DO   40 mg at 04/16/18 1023  . gabapentin (NEURONTIN) capsule 300 mg  300 mg Oral Daily PRN Heath Lark D, DO   300 mg at 04/16/18 0858  . meclizine (ANTIVERT) tablet 25 mg  25 mg Oral PRN Kathie Dike, MD      . multivitamin liquid 15 mL  15 mL Oral Daily Manuella Ghazi, Pratik D, DO   15 mL at 04/16/18 1024  . ondansetron (ZOFRAN) tablet 4 mg  4 mg Oral Q8H PRN Manuella Ghazi, Pratik D, DO      . oxyCODONE (Oxy IR/ROXICODONE) immediate release tablet 5 mg  5 mg Oral Q6H PRN Manuella Ghazi, Pratik D, DO   5 mg at 04/16/18 1023  . pantoprazole (PROTONIX) EC  tablet 40 mg  40 mg Oral BID Manuella Ghazi, Pratik D, DO   40 mg at 04/16/18 1023  . simvastatin (ZOCOR) tablet 20 mg  20 mg Oral q1800 Manuella Ghazi, Pratik D, DO      . temazepam (RESTORIL) capsule 15 mg  15 mg Oral QHS Shah, Pratik D, DO   15 mg at 04/15/18 2102  . traMADol Veatrice Bourbon)  tablet 50 mg  50 mg Oral Q8H Shah, Pratik D, DO   50 mg at 04/16/18 1023  . warfarin (COUMADIN) tablet 5 mg  5 mg Oral Once Kathie Dike, MD      . Warfarin - Pharmacist Dosing Inpatient   Does not apply q1800 Rodena Goldmann, DO          Physical Exam-Detailed Physical Exam  Blood pressure (!) 118/53, pulse 80, temperature 97.8 F (36.6 C), temperature source Oral, resp. rate 16, height 5\' 2"  (1.575 m), weight 50.1 kg, SpO2 94 %. Gen. appearance normal Small frame ectomorphic body habitus Cardiovascular exam the pulses are 2+ with no peripheral edema Lymphatic system no axillary nodes no supraclavicular nodes and no inguinal nodes The ambulatory status is ambulatory with assistance walker nonweightbearing left leg  Left upper extremity Inspection normal alignment Range of motion assessment full range of motion as recorded Stability assessment stability test reveal no instability or laxity Muscle strength and muscle tone are normal with no atrophy or tremors Sensation is normal Skin there are no scars rashes lesions or lacerations  Right upper extremity Alignment is normal no tenderness Full range of motion without contracture Ligaments are stable in the elbow shoulder Strength is normal No sensory deficit  skin normal  Right knee Normal alignment Normal range of motion All ligaments stable Muscle tone strength normal without tremor Sensation is normal Pulses excellent Skin intact  Left knee Alignment assessment difficult patient keeps knee in flexed position diffuse tenderness is noted around the knee with an effusion Knee is held at 45 degrees of flexion any attempts at motion causes extreme  pain Stability test deferred because of pain Muscle tone normal with no tremor Sensation is intact Pulses are good Skin is normal   The patient is oriented to person place and time The patient's mood and affect show no depression or anxiety or agitation Upper extremity coordination is normal Balance could not be assessed    MEDICAL DECISION MAKING (minimum/low)  Data Reviewed  I have personally reviewed the imaging studies and the report and my interpretation is:  Plain films of the knee show no fracture dislocation or acute abnormality there are generalized degenerative changes  CT scan again shows no fracture dislocation or acute abnormality large joint effusion is noted  Assessment and Plan  Knee locked cant weight bear CT suggests meniscus tear   Mri left knee   [name] 04/16/2018, 11:42 AM   [name] MD

## 2018-04-16 NOTE — Progress Notes (Signed)
PROGRESS NOTE    [name]  WUJ:[id number] DOB: [birth year] DOA: 04/15/2018 PCP: Dorothyann Peng, NP    Brief Narrative:  77 year old female with a history of diastolic heart failure, recurrent PE on anticoagulation, paroxysmal atrial fib, bilateral carotid artery stenosis, hypertension, who had a syncopal event that led to a fall on 10/9.  She reported that she twisted her left knee at the time and states that it has been painful ever since.  She is been unable to bear any weight on her knee.  She normally ambulates with a walker, but was unable to ambulate after her injury.  She was noted to be severely hypokalemic and somewhat dehydrated in the emergency room.  She was admitted for further work-up of left knee pain as well as syncope.   Assessment & Plan:   Principal Problem:   Syncope and collapse Active Problems:   PAF (paroxysmal atrial fibrillation) ([name])   Chronic diastolic congestive heart failure ([name])   Mixed hyperlipidemia   GERD (gastroesophageal reflux disease)   Anticoagulated on Coumadin   Aortic valve stenosis   Essential hypertension, benign   CAD (coronary artery disease)   Hypokalemia   1. Syncope likely secondary to dehydration and hypokalemia.  This is been replaced with oral supplementation.  Magnesium is normal.  This is likely secondary to ongoing Lasix use at home with no potassium supplementation.  Monitor on telemetry.  Patient has had 2D echocardiogram recently on 01/2018 with LVEF 50 to 55% and she is noted to have moderate to severe bilateral carotid artery stenosis which was noted on 12/2017.  This may be related to the problem, and can be explored further in the outpatient setting once potassium is repleted.  Urinalysis is unrevealing.  Seen by physical therapy with recommendations for skilled nursing facility placement 2. Left knee pain secondary to fall.  Patient is having severe pain with no findings on imaging initially.  CT scan of the knee shows  possible meniscal tear.  Orthopedic consultation requested. 3. Paroxysmal atrial fibrillation.  Noted on EKG.  Monitor on telemetry as patient is currently rate controlled.  Continue carvedilol and warfarin for anticoagulation. 4. History of PE.  Continue warfarin. 5. Dyslipidemia.  Continue Vytorin. 6. Chronic diastolic CHF.  Appears to be euvolemic currently.  Continue home furosemide dose and monitor daily weights. 7. History of CAD.  Continue Vytorin, Coreg, and aspirin. 8. GERD.  Continue PPI.   DVT prophylaxis: Coumadin Code Status: Full code Family Communication: No family present Disposition Plan: Skilled nursing facility placement on discharge   Consultants:   Orthopedics  Procedures:     Antimicrobials:       Subjective: Continues to have severe pain in left knee.  Unable to stand.  Objective: Vitals:   04/15/18 1532 04/15/18 2104 04/16/18 0528 04/16/18 1321  BP: (!) 179/74 (!) 119/55 (!) 118/53 (!) 96/52  Pulse: (!) 56 82 80 69  Resp: 17 16 16 18   Temp: 98.7 F (37.1 C) 98.2 F (36.8 C) 97.8 F (36.6 C) 98 F (36.7 C)  TempSrc: Oral Oral Oral   SpO2: 98% 92% 94% 97%  Weight:      Height:        Intake/Output Summary (Last 24 hours) at 04/16/2018 1903 Last data filed at 04/16/2018 1735 Gross per 24 hour  Intake 840 ml  Output 1850 ml  Net -1010 ml   Filed Weights   04/15/18 0529 04/15/18 0900  Weight: 48.5 kg 50.1 kg    Examination:  General exam: Appears calm and comfortable  Respiratory system: Clear to auscultation. Respiratory effort normal. Cardiovascular system: S1 & S2 heard, RRR. No JVD, murmurs, rubs, gallops or clicks. No pedal edema. Gastrointestinal system: Abdomen is nondistended, soft and nontender. No organomegaly or masses felt. Normal bowel sounds heard. Central nervous system: Alert and oriented. No focal neurological deficits. Extremities: Joint effusion noted in left knee.  Severe pain with minimal movement.. Skin: No  rashes, lesions or ulcers Psychiatry: Judgement and insight appear normal. Mood & affect appropriate.     Data Reviewed: I have personally reviewed following labs and imaging studies  CBC: Recent Labs  Lab 04/15/18 0639 04/16/18 0437  WBC 7.5 5.5  NEUTROABS 4.8  --   HGB 13.2 12.5  HCT 38.4 36.8  MCV 84.4 85.2  PLT 172 585   Basic Metabolic Panel: Recent Labs  Lab 04/15/18 0639 04/16/18 0437  NA 135 138  K 2.5* 3.4*  CL 90* 99  CO2 36* 29  GLUCOSE 188* 159*  BUN 12 13  CREATININE 0.81 0.81  CALCIUM 8.7* 8.6*  MG 2.0 1.9   GFR: Estimated Creatinine Clearance: 45.3 mL/min (by C-G formula based on SCr of 0.81 mg/dL). Liver Function Tests: Recent Labs  Lab 04/15/18 0639  AST 13*  ALT 10  ALKPHOS 57  BILITOT 1.6*  PROT 6.7  ALBUMIN 3.2*   No results for input(s): LIPASE, AMYLASE in the last 168 hours. No results for input(s): AMMONIA in the last 168 hours. Coagulation Profile: Recent Labs  Lab 04/15/18 0639  INR 2.41   Cardiac Enzymes: Recent Labs  Lab 04/15/18 0639  CKTOTAL 22*  TROPONINI <0.03   BNP (last 3 results) Recent Labs    03/07/18 0939  PROBNP 268.0*   HbA1C: No results for input(s): HGBA1C in the last 72 hours. CBG: No results for input(s): GLUCAP in the last 168 hours. Lipid Profile: No results for input(s): CHOL, HDL, LDLCALC, TRIG, CHOLHDL, LDLDIRECT in the last 72 hours. Thyroid Function Tests: No results for input(s): TSH, T4TOTAL, FREET4, T3FREE, THYROIDAB in the last 72 hours. Anemia Panel: No results for input(s): VITAMINB12, FOLATE, FERRITIN, TIBC, IRON, RETICCTPCT in the last 72 hours. Sepsis Labs: No results for input(s): PROCALCITON, LATICACIDVEN in the last 168 hours.  Recent Results (from the past 240 hour(s))  Culture, blood (routine x 2)     Status: None (Preliminary result)   Collection Time: 04/15/18  7:16 AM  Result Value Ref Range Status   Specimen Description BLOOD BLOOD LEFT FOREARM  Final   Special  Requests   Final    BOTTLES DRAWN AEROBIC AND ANAEROBIC Blood Culture adequate volume   Culture   Final    NO GROWTH < 24 HOURS Performed at [facility], [address], [place], [place] [address]    Report Status PENDING  Incomplete  Culture, blood (routine x 2)     Status: None (Preliminary result)   Collection Time: 04/15/18  7:24 AM  Result Value Ref Range Status   Specimen Description BLOOD BLOOD LEFT HAND  Final   Special Requests   Final    BOTTLES DRAWN AEROBIC ONLY Blood Culture adequate volume   Culture   Final    NO GROWTH < 24 HOURS Performed at [facility], [address] [place], [place] [address]    Report Status PENDING  Incomplete         Radiology Studies: Ct Head Wo Contrast  Result Date: 04/15/2018 CLINICAL DATA:  Patient passed out 5  days ago and fell. Posterior neck pain. EXAM: CT HEAD WITHOUT CONTRAST CT CERVICAL SPINE WITHOUT CONTRAST TECHNIQUE: Multidetector CT imaging of the head and cervical spine was performed following the standard protocol without intravenous contrast. Multiplanar CT image reconstructions of the cervical spine were also generated. COMPARISON:  MRI brain 12/20/2017. CT head 12/19/2017. CT head and cervical spine 10/02/2006 FINDINGS: CT HEAD FINDINGS Brain: Mild cerebral atrophy. Patchy low-attenuation changes in the deep white matter consistent with small vessel ischemic changes and old lacunar infarcts. Old area of encephalomalacia consistent with old infarct in the right superior cerebellum. No mass-effect or midline shift. No abnormal extra-axial fluid collections. Gray-white matter junctions are distinct. Basal cisterns are not effaced. No acute intracranial hemorrhage. Vascular: Moderate intracranial arterial vascular calcifications are present. Skull: Calvarium appears intact. No acute depressed skull fractures. Sinuses/Orbits: Paranasal sinuses and mastoid air cells are clear. Other: None. CT CERVICAL SPINE FINDINGS Alignment: Normal  alignment of the cervical spine and facet joints. C1-2 articulation appears intact. Skull base and vertebrae: Skull base appears intact. No vertebral compression deformities. No focal bone lesion or bone destruction. Soft tissues and spinal canal: No prevertebral soft tissue swelling. No abnormal paraspinal soft tissue mass or infiltration. Disc levels: Postoperative changes with anterior screw fixation and intervertebral fusion at C3-4 and from C5-C7. Fused segments appear intact. Hardware appears intact. Degenerative changes with disc space narrowing and endplate hypertrophic changes at C7-T1 and T1-2. Degenerative changes at C1-2. Degenerative changes in the facet joints. Upper chest: Lung apices are clear. Other: None. IMPRESSION: 1. No acute intracranial abnormalities. Chronic atrophy and small vessel ischemic changes. 2. Normal alignment of the cervical spine. Postoperative changes with anterior screw fixation and intervertebral fusion at C3-4 and C5-C7. No acute displaced fractures identified. Electronically Signed   By: Lucienne Capers M.D.   On: 04/15/2018 06:36   Ct Cervical Spine Wo Contrast  Result Date: 04/15/2018 CLINICAL DATA:  Patient passed out 5 days ago and fell. Posterior neck pain. EXAM: CT HEAD WITHOUT CONTRAST CT CERVICAL SPINE WITHOUT CONTRAST TECHNIQUE: Multidetector CT imaging of the head and cervical spine was performed following the standard protocol without intravenous contrast. Multiplanar CT image reconstructions of the cervical spine were also generated. COMPARISON:  MRI brain 12/20/2017. CT head 12/19/2017. CT head and cervical spine 10/02/2006 FINDINGS: CT HEAD FINDINGS Brain: Mild cerebral atrophy. Patchy low-attenuation changes in the deep white matter consistent with small vessel ischemic changes and old lacunar infarcts. Old area of encephalomalacia consistent with old infarct in the right superior cerebellum. No mass-effect or midline shift. No abnormal extra-axial fluid  collections. Gray-white matter junctions are distinct. Basal cisterns are not effaced. No acute intracranial hemorrhage. Vascular: Moderate intracranial arterial vascular calcifications are present. Skull: Calvarium appears intact. No acute depressed skull fractures. Sinuses/Orbits: Paranasal sinuses and mastoid air cells are clear. Other: None. CT CERVICAL SPINE FINDINGS Alignment: Normal alignment of the cervical spine and facet joints. C1-2 articulation appears intact. Skull base and vertebrae: Skull base appears intact. No vertebral compression deformities. No focal bone lesion or bone destruction. Soft tissues and spinal canal: No prevertebral soft tissue swelling. No abnormal paraspinal soft tissue mass or infiltration. Disc levels: Postoperative changes with anterior screw fixation and intervertebral fusion at C3-4 and from C5-C7. Fused segments appear intact. Hardware appears intact. Degenerative changes with disc space narrowing and endplate hypertrophic changes at C7-T1 and T1-2. Degenerative changes at C1-2. Degenerative changes in the facet joints. Upper chest: Lung apices are clear. Other: None. IMPRESSION: 1. No acute  intracranial abnormalities. Chronic atrophy and small vessel ischemic changes. 2. Normal alignment of the cervical spine. Postoperative changes with anterior screw fixation and intervertebral fusion at C3-4 and C5-C7. No acute displaced fractures identified. Electronically Signed   By: Lucienne Capers M.D.   On: 04/15/2018 06:36   Ct Knee Left Wo Contrast  Result Date: 04/15/2018 CLINICAL DATA:  Left knee pain and swelling since a fall and twisting injury 5 days ago. Initial encounter. EXAM: CT OF THE LEFT KNEE WITHOUT CONTRAST TECHNIQUE: Multidetector CT imaging of the left knee was performed according to the standard protocol. Multiplanar CT image reconstructions were also generated. COMPARISON:  Plain films left knee earlier today. FINDINGS: Bones/Joint/Cartilage No fracture is  identified. No focal bony lesion is seen. There is mild medial compartment joint space narrowing. Mild osteophytosis is seen about the knee. Ligaments Suboptimally assessed by CT. The cruciate and collateral ligaments appear intact. Chondrocalcinosis of the menisci and anterior cruciate ligament is noted. The root of the posterior horn of the medial meniscus is difficult to visualize and may be torn. Muscles and Tendons Intact. Soft tissues Moderate to moderately large joint effusion is seen. IMPRESSION: Negative for fracture or other acute bony abnormality. Possible tear of the root of the posterior horn of the medial meniscus. This could be better evaluated with nonemergent MRI. Chondrocalcinosis. Moderate to moderately large joint effusion. Electronically Signed   By: Inge Rise M.D.   On: 04/15/2018 10:44   Dg Knee Complete 4 Views Left  Result Date: 04/15/2018 CLINICAL DATA:  Left knee pain for 5 days. EXAM: LEFT KNEE - COMPLETE 4+ VIEW COMPARISON:  Left knee radiograph 11/02/2016 FINDINGS: Mild degenerative change of the left knee. No fracture or dislocation. No joint effusion. There are vascular calcifications. IMPRESSION: Mild left knee osteoarthrosis without acute abnormality. Electronically Signed   By: Ulyses Jarred M.D.   On: 04/15/2018 06:44        Scheduled Meds: . aspirin EC  81 mg Oral Daily  . carvedilol  25 mg Oral BID WC  . ezetimibe  10 mg Oral Daily  . ferrous sulfate  325 mg Oral QPM  . furosemide  40 mg Oral Daily  . multivitamin  15 mL Oral Daily  . pantoprazole  40 mg Oral BID  . simvastatin  20 mg Oral q1800  . temazepam  15 mg Oral QHS  . traMADol  50 mg Oral Q8H  . Warfarin - Pharmacist Dosing Inpatient   Does not apply q1800   Continuous Infusions:   LOS: 1 day    Time spent: 1mins    Kathie Dike, MD [facility] Pager [phone]  If 7PM-7AM, please contact night-coverage [email or web address] Password [email or web address] 04/16/2018, 7:03 PM

## 2018-04-16 NOTE — Clinical Social Work Note (Signed)
Clinical Social Work Assessment  Patient Details  Name: [name] MRN: [id number] Date of Birth: 04-29-1941  Date of referral:  04/16/18               Reason for consult:  Facility Placement                Permission sought to share information with:    Permission granted to share information::     Name::        Agency::     Relationship::     Contact Information:     Housing/Transportation Living arrangements for the past 2 months:  Single Family Home Source of Information:  Patient Patient Interpreter Needed:  None Criminal Activity/Legal Involvement Pertinent to Current Situation/Hospitalization:  No - Comment as needed Significant Relationships:  Adult Children Lives with:  Adult Children Do you feel safe going back to the place where you live?    Need for family participation in patient care:     Care giving concerns:  None identified at baseline. Patient has [place] involved currently.    Social Worker assessment / plan:  At baseline, patient ambulates with a  [place] Rile, drives and is independent in ADLs (she states that her aid is in the home when she showers for safety reasons). Patient stated that she wants to go home at discharge. She acknowledged an orthopedic consult. She stated that as long as she did not have to have surgery she planned on going home at discharge. She indicated that she would consider SNF if she had to have surgery.  LCSW will continue to follow through ortho consult for disposition decision.   Employment status:  Retired Forensic scientist:  [employer] PT Recommendations:  [place], [place] / Referral to community resources:  [place]  Patient/Family's Response to care:  Patient would prefer to go home as long as she did not have to have surgery.   Patient/Family's Understanding of and Emotional Response to Diagnosis, Current Treatment, and Prognosis:  Patient understands her  diagnosis, treatment and prognosis.   Emotional Assessment Appearance:  Appears stated age Attitude/Demeanor/Rapport:    Affect (typically observed):  Accepting, Calm Orientation:  Oriented to Self, Oriented to Place, Oriented to  Time, Oriented to Situation Alcohol / Substance use:  Not Applicable Psych involvement (Current and /or in the community):  No (Comment)  Discharge Needs  Concerns to be addressed:  Discharge Planning Concerns Readmission within the last 30 days:  No Current discharge risk:  None Barriers to Discharge:  No Barriers Identified   Ihor Gully, LCSW 04/16/2018, 4:31 PM

## 2018-04-16 NOTE — Progress Notes (Signed)
ANTICOAGULATION CONSULT NOTE -   Pharmacy Consult for  Warfarin dosing Indication: atrial fibrillation  Home Dose:  warfarin 2.5mg  on Sun-Thurs-Sat; warfarin 5mg  on Mon-Tues-Wed-Fri Last dose: warfarin 5mg  on 10-13 Concurrent anti-platelet drugs: aspirin 81mg  daily Drug Interactions: no significant interactions  Allergies  Allergen Reactions  . Iodinated Diagnostic Agents Anaphylaxis and Other (See Comments)    IPD dye Info given by patient  . Ioxaglate Anaphylaxis and Other (See Comments)    Info given by patient  . Red Dye Anaphylaxis  . Lactose Intolerance (Gi)   . Whey Other (See Comments)    Lactose intolerance  . Darvon [Propoxyphene] Rash  . Hydralazine Anxiety and Other (See Comments)    Facial flushing, pt prefers not to use it.   . Milk-Related Compounds Other (See Comments)    Lactose intolerance Can tolerate milk if its cooked into the recipe, just can't drink milk     Patient Measurements: Height: 5\' 2"  (157.5 cm) Weight: 110 lb 7.2 oz (50.1 kg) IBW/kg (Calculated) : 50.1 Heparin Dosing Weight: HEPARIN DW (KG): 50.1  Vital Signs: Temp: 97.8 F (36.6 C) (10/15 0528) Temp Source: Oral (10/15 0528) BP: 118/53 (10/15 0528) Pulse Rate: 80 (10/15 0528)  Labs: Recent Labs    04/15/18 0639 04/16/18 0437  HGB 13.2 12.5  HCT 38.4 36.8  PLT 172 190  LABPROT 26.1*  --   INR 2.41  --   CREATININE 0.81 0.81  CKTOTAL 22*  --   TROPONINI <0.03  --     Estimated Creatinine Clearance: 45.3 mL/min (by C-G formula based on SCr of 0.81 mg/dL).   Medical History: Past Medical History:  Diagnosis Date  . Anemia 2005   Generally microcytic, transfusions in [address], 2012, 02/2015, 05/2015  . Aortic stenosis    Moderate November 2017  . Arthritis   . Broken back 2013   Chronic back pain.   [name] CAD (coronary artery disease)    Cardiac catheterization 2014 - 80% mid RCA and 70% OM managed medically  . CHF (congestive heart failure) ([place])   . Chronic bilateral  pleural effusions   . Chronic headache   . Contrast media allergy   . Diastolic heart failure ([place])   . Esophageal cancer ([place]) 05/06/2015   Adenocarcinoma GE junction  . Facial paresthesia   . GERD (gastroesophageal reflux disease)   . H. pylori infection   . H/O iron deficiency    05-06-15 iron infusion ([facility])  . HH (hiatus hernia) 2008   Large with associated erosions  . Hypertension   . Paroxysmal atrial fibrillation ([name])   . PONV (postoperative nausea and vomiting)   . Pulmonary emboli ([place]) 2008, 2012  . Stroke [facility]) 1982     Assessment: Pharmacy consulted to dose warfarin for this 18 yof on chronic warfarin anticoagulation for paroxysmal atrial fibrillation. Patient takes  warfarin 2.5mg  on Sun-Thurs-Sat and  warfarin 5mg  on Mon-Tues-Wed-Fri. Her INR today of 2.41 is within desired therapeutic goal range.  Goal of Therapy:  INR 2-3 Monitor platelets by anticoagulation protocol: Yes   Plan:  Give warfarin 5mg  x1 dose today  Pharmacy will continue to monitor appropriate labs and for signs of bleeding.  [name] 04/16/2018,8:53 AM

## 2018-04-17 DIAGNOSIS — Z7901 Long term (current) use of anticoagulants: Secondary | ICD-10-CM

## 2018-04-17 DIAGNOSIS — R55 Syncope and collapse: Secondary | ICD-10-CM

## 2018-04-17 DIAGNOSIS — M25562 Pain in left knee: Secondary | ICD-10-CM

## 2018-04-17 LAB — BASIC METABOLIC PANEL
Anion gap: 8 (ref 5–15)
BUN: 14 mg/dL (ref 8–23)
CHLORIDE: 98 mmol/L (ref 98–111)
CO2: 33 mmol/L — AB (ref 22–32)
Calcium: 8.6 mg/dL — ABNORMAL LOW (ref 8.9–10.3)
Creatinine, Ser: 0.8 mg/dL (ref 0.44–1.00)
GFR calc Af Amer: >60 mL/min (ref >60)
GLUCOSE: 153 mg/dL — AB (ref 70–99)
POTASSIUM: 3 mmol/L — AB (ref 3.5–5.1)
Sodium: 139 mmol/L (ref 135–145)

## 2018-04-17 LAB — PROTIME-INR
INR: 4.37
INR: 3.81
Prothrombin Time: 41.1 seconds — ABNORMAL HIGH (ref 11.4–15.2)
Prothrombin Time: 36.9 seconds — ABNORMAL HIGH (ref 11.4–15.2)

## 2018-04-17 LAB — CBC
HEMATOCRIT: 34.2 % — AB (ref 36.0–46.0)
HEMOGLOBIN: 11.6 g/dL — AB (ref 12.0–15.0)
MCH: 28.8 pg (ref 26.0–34.0)
MCHC: 33.9 g/dL (ref 30.0–36.0)
MCV: 84.9 fL (ref 80.0–100.0)
Platelets: 197 10*3/uL (ref 150–400)
RBC: 4.03 MIL/uL (ref 3.87–5.11)
RDW: 11.9 % (ref 11.5–15.5)
WBC: 4.4 10*3/uL (ref 4.0–10.5)
nRBC: 0 % (ref 0.0–0.2)

## 2018-04-17 LAB — URINE CULTURE: SPECIAL REQUESTS: NORMAL

## 2018-04-17 LAB — SURGICAL PCR SCREEN
MRSA, PCR: NEGATIVE
STAPHYLOCOCCUS AUREUS: NEGATIVE

## 2018-04-17 MED ORDER — CEFAZOLIN SODIUM-DEXTROSE 2-4 GM/100ML-% IV SOLN
INTRAVENOUS | Status: AC
  Filled 2018-04-17: qty 100

## 2018-04-17 MED ORDER — POVIDONE-IODINE 10 % EX SWAB: 2.0000 "application " | Freq: Once | CUTANEOUS | Status: DC

## 2018-04-17 MED ORDER — POTASSIUM CHLORIDE CRYS ER 20 MEQ PO TBCR
40.0000 meq | EXTENDED_RELEASE_TABLET | ORAL | Status: AC
  Administered 2018-04-17 (×3): 40 meq via ORAL
  Filled 2018-04-17 (×3): qty 2

## 2018-04-17 MED ORDER — VITAMIN K1 10 MG/ML IJ SOLN
INTRAMUSCULAR | Status: AC
  Filled 2018-04-17: qty 2

## 2018-04-17 MED ORDER — VITAMIN K1 10 MG/ML IJ SOLN
20.0000 mg | Freq: Once | INTRAVENOUS | Status: AC
  Administered 2018-04-17: 20 mg via INTRAVENOUS
  Filled 2018-04-17: qty 2

## 2018-04-17 MED ORDER — CEFAZOLIN SODIUM-DEXTROSE 2-4 GM/100ML-% IV SOLN
2.0000 g | INTRAVENOUS | Status: AC
  Administered 2018-04-18: 2 g via INTRAVENOUS
  Filled 2018-04-17: qty 100

## 2018-04-17 MED ORDER — PHYTONADIONE 5 MG PO TABS
10.0000 mg | ORAL_TABLET | Freq: Once | ORAL | Status: AC
  Administered 2018-04-17: 10 mg via ORAL
  Filled 2018-04-17: qty 2

## 2018-04-17 MED ORDER — CHLORHEXIDINE GLUCONATE 4 % EX LIQD
60.0000 mL | Freq: Once | CUTANEOUS | Status: AC
  Administered 2018-04-18: 4 via TOPICAL
  Filled 2018-04-17: qty 15

## 2018-04-17 MED ORDER — POTASSIUM CHLORIDE 10 MEQ/100ML IV SOLN: 10.0000 meq | INTRAVENOUS | Status: DC

## 2018-04-17 NOTE — Clinical Social Work Note (Signed)
Patient states that she has not met with Dr. Aline Brochure yet and is unable to make a decision related to going to SNF.  Areta Terwilliger, Clydene Pugh, LCSW

## 2018-04-17 NOTE — Progress Notes (Signed)
ANTICOAGULATION CONSULT NOTE -   Pharmacy Consult for  Warfarin dosing Indication: atrial fibrillation  Home Dose:  warfarin 2.5mg  on Sun-Thurs-Sat; warfarin 5mg  on Mon-Tues-Wed-Fri Last dose: warfarin 5mg  on 10-13 Concurrent anti-platelet drugs: aspirin 81mg  daily Drug Interactions: no significant interactions  Allergies  Allergen Reactions  . Iodinated Diagnostic Agents Anaphylaxis and Other (See Comments)    IPD dye Info given by patient  . Ioxaglate Anaphylaxis and Other (See Comments)    Info given by patient  . Red Dye Anaphylaxis  . Lactose Intolerance (Gi)   . Whey Other (See Comments)    Lactose intolerance  . Darvon [Propoxyphene] Rash  . Hydralazine Anxiety and Other (See Comments)    Facial flushing, pt prefers not to use it.   . Milk-Related Compounds Other (See Comments)    Lactose intolerance Can tolerate milk if its cooked into the recipe, just can't drink milk     Patient Measurements: Height: 5\' 2"  (157.5 cm) Weight: 110 lb 7.2 oz (50.1 kg) IBW/kg (Calculated) : 50.1 Heparin Dosing Weight: HEPARIN DW (KG): 50.1  Vital Signs: Temp: 98.1 F (36.7 C) (10/16 0642) Temp Source: Oral (10/16 0642) BP: 127/71 (10/16 0642) Pulse Rate: 73 (10/16 0642)  Labs: Recent Labs    04/15/18 0639 04/16/18 0437 04/17/18 0453  HGB 13.2 12.5 11.6*  HCT 38.4 36.8 34.2*  PLT 172 190 197  LABPROT 26.1*  --  41.1*  INR 2.41  --  4.37*  CREATININE 0.81 0.81 0.80  CKTOTAL 22*  --   --   TROPONINI <0.03  --   --     Estimated Creatinine Clearance: 45.8 mL/min (by C-G formula based on SCr of 0.8 mg/dL).   Medical History: Past Medical History:  Diagnosis Date  . Anemia 2005   Generally microcytic, transfusions in [address], 2012, 02/2015, 05/2015  . Aortic stenosis    Moderate November 2017  . Arthritis   . Broken back 2013   Chronic back pain.   [name] CAD (coronary artery disease)    Cardiac catheterization 2014 - 80% mid RCA and 70% OM managed medically  . CHF  (congestive heart failure) ([place])   . Chronic bilateral pleural effusions   . Chronic headache   . Contrast media allergy   . Diastolic heart failure ([place])   . Esophageal cancer ([place]) 05/06/2015   Adenocarcinoma GE junction  . Facial paresthesia   . GERD (gastroesophageal reflux disease)   . H. pylori infection   . H/O iron deficiency    05-06-15 iron infusion ([facility])  . HH (hiatus hernia) 2008   Large with associated erosions  . Hypertension   . Paroxysmal atrial fibrillation ([name])   . PONV (postoperative nausea and vomiting)   . Pulmonary emboli ([place]) 2008, 2012  . Stroke [facility]) 1982     Assessment: Pharmacy consulted to dose warfarin for this 85 yof on chronic warfarin anticoagulation for paroxysmal atrial fibrillation. Patient takes  warfarin 2.5mg  on Sun-Thurs-Sat and  warfarin 5mg  on Mon-Tues-Wed-Fri. INR supratherapeutic at 4.37.  Goal of Therapy:  INR 2-3 Monitor platelets by anticoagulation protocol: Yes   Plan:  Hold warfarin x 1 dose Pharmacy will continue to monitor appropriate labs and for signs of bleeding.  Revonda Standard Rae Plotner 04/17/2018,9:18 AM

## 2018-04-17 NOTE — Progress Notes (Signed)
PT-41.1 INR-4.37 notified dr Olevia Bowens via page with results.

## 2018-04-17 NOTE — Care Management Important Message (Signed)
Important Message  Patient Details  Name: [name] MRN: [id number] Date of Birth: [birth year]   Medicare Important Message Given:  Yes    Shelda Altes 04/17/2018, 2:38 PM

## 2018-04-17 NOTE — H&P (View-Only) (Signed)
Subjective: MRI follow-up  Objective: Vital signs in last 24 hours: Temp:  [98 F (36.7 C)-98.5 F (36.9 C)] 98.1 F (36.7 C) (10/16 0642) Pulse Rate:  [66-73] 73 (10/16 0642) Resp:  [18-20] 18 (10/16 0642) BP: (96-127)/(51-71) 127/71 (10/16 0642) SpO2:  [95 %-98 %] 95 % (10/16 0642)  Intake/Output from previous day: 10/15 0701 - 10/16 0700 In: 720 [P.O.:720] Out: 1550 [Urine:1550] Intake/Output this shift: No intake/output data recorded.  Recent Labs    04/15/18 0639 04/16/18 0437 04/17/18 0453  HGB 13.2 12.5 11.6*   Recent Labs    04/16/18 0437 04/17/18 0453  WBC 5.5 4.4  RBC 4.32 4.03  HCT 36.8 34.2*  PLT 190 197   Recent Labs    04/16/18 0437 04/17/18 0453  NA 138 139  K 3.4* 3.0*  CL 99 98  CO2 29 33*  BUN 13 14  CREATININE 0.81 0.80  GLUCOSE 159* 153*  CALCIUM 8.6* 8.6*   Recent Labs    04/15/18 0639 04/17/18 0453  INR 2.41 4.37*     Assessment/Plan: IMPRESSION: 1. Near complete full thickness radial tear of the posterior mesial corner of the medial meniscus near the meniscal root. 2. Slight dorsal bowing of the anterior cruciate ligament is slightly thickened in appearance. Mild ACL sprain may account appearance. The PCL is intact. The ligaments are. 3. Medial femorotibial joint space narrowing secondary to diffuse malacia. Mild thinning is also noted of the cartilage overlying medial patellar facet trochlea. 4. Moderate large suprapatellar joint effusion slightly thickened medial plica. 5. Deep infrapatellar bursitis. 6. Small popliteal cyst.     Electronically Signed   By: Ashley Royalty M.D.   On: 04/16/2018 20:28   The MRI shows that she has a torn medial meniscus and will likely need arthroscopic surgery.  To expedite surgery cardiology consult and further at the potassium replacement would be necessary.   [name] 04/17/2018, 7:47 AM

## 2018-04-17 NOTE — Progress Notes (Signed)
Subjective: MRI follow-up  Objective: Vital signs in last 24 hours: Temp:  [98 F (36.7 C)-98.5 F (36.9 C)] 98.1 F (36.7 C) (10/16 0642) Pulse Rate:  [66-73] 73 (10/16 0642) Resp:  [18-20] 18 (10/16 0642) BP: (96-127)/(51-71) 127/71 (10/16 0642) SpO2:  [95 %-98 %] 95 % (10/16 0642)  Intake/Output from previous day: 10/15 0701 - 10/16 0700 In: 720 [P.O.:720] Out: 1550 [Urine:1550] Intake/Output this shift: No intake/output data recorded.  Recent Labs    04/15/18 0639 04/16/18 0437 04/17/18 0453  HGB 13.2 12.5 11.6*   Recent Labs    04/16/18 0437 04/17/18 0453  WBC 5.5 4.4  RBC 4.32 4.03  HCT 36.8 34.2*  PLT 190 197   Recent Labs    04/16/18 0437 04/17/18 0453  NA 138 139  K 3.4* 3.0*  CL 99 98  CO2 29 33*  BUN 13 14  CREATININE 0.81 0.80  GLUCOSE 159* 153*  CALCIUM 8.6* 8.6*   Recent Labs    04/15/18 0639 04/17/18 0453  INR 2.41 4.37*     Assessment/Plan: IMPRESSION: 1. Near complete full thickness radial tear of the posterior mesial corner of the medial meniscus near the meniscal root. 2. Slight dorsal bowing of the anterior cruciate ligament is slightly thickened in appearance. Mild ACL sprain may account appearance. The PCL is intact. The ligaments are. 3. Medial femorotibial joint space narrowing secondary to diffuse malacia. Mild thinning is also noted of the cartilage overlying medial patellar facet trochlea. 4. Moderate large suprapatellar joint effusion slightly thickened medial plica. 5. Deep infrapatellar bursitis. 6. Small popliteal cyst.     Electronically Signed   By: [name] M.D.   On: 04/16/2018 20:28   The MRI shows that she has a torn medial meniscus and will likely need arthroscopic surgery.  To expedite surgery cardiology consult and further at the potassium replacement would be necessary.   [name] 04/17/2018, 7:47 AM

## 2018-04-17 NOTE — Progress Notes (Signed)
PT Cancellation Note  Patient Details Name: [name] MRN: [id number] DOB: Sep 07, 1940   Cancelled Treatment:    Reason Eval/Treat Not Completed: Patient declined, no reason specified.  Patient declined therapy secondary to c/o fatigue and left knee pain.   4:06 PM, 04/17/18 Lonell Grandchild, MPT Physical Therapist with [facility] [phone] office [phone] mobile phone

## 2018-04-17 NOTE — Progress Notes (Addendum)
PROGRESS NOTE    [name]  KVQ:[id number] DOB: 1940/12/04 DOA: 04/15/2018 PCP: Dorothyann Peng, NP     Brief Narrative:  77 year old woman admitted from home on 10/14.  On 10/9 she suffered a syncopal event and states that at that time she twisted her left knee and has been painful ever since.  She has been unable to bear weight on her knee.  She has a past medical history significant for diastolic heart failure, recurrent PE on warfarin, paroxysmal atrial for relation, hypertension as well as bilateral carotid artery stenosis.  Admission was requested for further evaluation and management.   Assessment & Plan:   Principal Problem:   Syncope and collapse Active Problems:   PAF (paroxysmal atrial fibrillation) ([name])   Chronic diastolic congestive heart failure ([name])   Mixed hyperlipidemia   GERD (gastroesophageal reflux disease)   Anticoagulated on Coumadin   Aortic valve stenosis   Essential hypertension, benign   CAD (coronary artery disease)   Hypokalemia   Syncopal event -Likely related to significant dehydration and hypokalemia. -Likely secondary to ongoing Lasix use at home without potassium supplementation. -She has not displayed any arrhythmias on telemetry. -2D echo from August 2019 showed an ejection fraction of 50 to 50%. -She has bilateral carotid artery stenosis which does not result in hemodynamically significant stenosis as per ultrasound on 12/2017.  However she should have plans for vascular follow-up after discharge.  Left knee pain -MRI shows a near complete full-thickness radial tear of the posterior medial corner of the medial meniscus.  Slight dorsal bowing of the ACL, PCL is intact.  Deep infrapatellar bursitis. -Discussed case with Dr. Aline Brochure, he will plan for surgery hopefully on 10/17. -Patient is medically cleared to proceed with surgery on 10/17 as long as INR is below 2.  Case was discussed with cardiology, Dr. Bronson Ing who agrees that no  further pre-operative cardiac testing is necessary at this time.  Patient has mild to moderate aortic stenosis with a gradient of 14 and an area of 0.99, however she is asymptomatic.  Hypokalemia -Continue to replace orally, magnesium is normal.  History of PE/atrial fibrillation -Rate is currently controlled. -We will need to reverse warfarin for planned surgery in a.m. -We will give 10 mg of oral vitamin K today with repeat INR at around 5 PM.  If INR remains elevated may repeat dose of vitamin K.  Hyperlipidemia -Continue Vytorin  Chronic diastolic heart failure -Echo as above. -Is compensated and euvolemic.  Continue home furosemide dose.  GERD -Continue PPI  Coronary artery disease -Denies chest pain or shortness of breath. -Continue Vytorin Coreg and aspirin.   DVT prophylaxis: Warfarin Code Status: Full code Family Communication: Patient only Disposition Plan: Anticipate surgery for medial meniscus repair of the left knee on 10/17.  Will likely need SNF after.  Consultants:   Orthopedics  Procedures:   None  Antimicrobials:  Anti-infectives (From admission, onward)   None       Subjective: In bed, with significant left knee pain.  Unable to bear weight.  Denies chest pain or shortness of breath.  Objective: Vitals:   04/16/18 2231 04/17/18 0642 04/17/18 1328 04/17/18 1332  BP: (!) 109/51 127/71 (!) 72/27 (!) 128/48  Pulse: 66 73 61 71  Resp: 20 18 18    Temp: 98.5 F (36.9 C) 98.1 F (36.7 C) 98.2 F (36.8 C)   TempSrc: Oral Oral Oral   SpO2: 98% 95% 90%   Weight:      Height:  Intake/Output Summary (Last 24 hours) at 04/17/2018 1508 Last data filed at 04/17/2018 1142 Gross per 24 hour  Intake 360 ml  Output 1550 ml  Net -1190 ml   Filed Weights   04/15/18 0529 04/15/18 0900  Weight: 48.5 kg 50.1 kg    Examination:  General exam: Alert, awake, oriented x 3 Respiratory system: Clear to auscultation. Respiratory effort  normal. Cardiovascular system:RRR. No murmurs, rubs, gallops. Gastrointestinal system: Abdomen is nondistended, soft and nontender. No organomegaly or masses felt. Normal bowel sounds heard. Central nervous system: Alert and oriented. No focal neurological deficits. Extremities: No C/C/E, +pedal pulses, significant left knee edema. Skin: No rashes, lesions or ulcers Psychiatry: Judgement and insight appear normal. Mood & affect appropriate.     Data Reviewed: I have personally reviewed following labs and imaging studies  CBC: Recent Labs  Lab 04/15/18 0639 04/16/18 0437 04/17/18 0453  WBC 7.5 5.5 4.4  NEUTROABS 4.8  --   --   HGB 13.2 12.5 11.6*  HCT 38.4 36.8 34.2*  MCV 84.4 85.2 84.9  PLT 172 190 081   Basic Metabolic Panel: Recent Labs  Lab 04/15/18 0639 04/16/18 0437 04/17/18 0453  NA 135 138 139  K 2.5* 3.4* 3.0*  CL 90* 99 98  CO2 36* 29 33*  GLUCOSE 188* 159* 153*  BUN 12 13 14   CREATININE 0.81 0.81 0.80  CALCIUM 8.7* 8.6* 8.6*  MG 2.0 1.9  --    GFR: Estimated Creatinine Clearance: 45.8 mL/min (by C-G formula based on SCr of 0.8 mg/dL). Liver Function Tests: Recent Labs  Lab 04/15/18 0639  AST 13*  ALT 10  ALKPHOS 57  BILITOT 1.6*  PROT 6.7  ALBUMIN 3.2*   No results for input(s): LIPASE, AMYLASE in the last 168 hours. No results for input(s): AMMONIA in the last 168 hours. Coagulation Profile: Recent Labs  Lab 04/15/18 0639 04/17/18 0453  INR 2.41 4.37*   Cardiac Enzymes: Recent Labs  Lab 04/15/18 0639  CKTOTAL 22*  TROPONINI <0.03   BNP (last 3 results) Recent Labs    03/07/18 0939  PROBNP 268.0*   HbA1C: No results for input(s): HGBA1C in the last 72 hours. CBG: No results for input(s): GLUCAP in the last 168 hours. Lipid Profile: No results for input(s): CHOL, HDL, LDLCALC, TRIG, CHOLHDL, LDLDIRECT in the last 72 hours. Thyroid Function Tests: No results for input(s): TSH, T4TOTAL, FREET4, T3FREE, THYROIDAB in the last 72  hours. Anemia Panel: No results for input(s): VITAMINB12, FOLATE, FERRITIN, TIBC, IRON, RETICCTPCT in the last 72 hours. Urine analysis:    Component Value Date/Time   COLORURINE YELLOW 04/16/2018 0450   APPEARANCEUR CLEAR 04/16/2018 0450   LABSPEC 1.011 04/16/2018 0450   PHURINE 6.0 04/16/2018 0450   GLUCOSEU NEGATIVE 04/16/2018 0450   HGBUR MODERATE (A) 04/16/2018 0450   BILIRUBINUR NEGATIVE 04/16/2018 0450   BILIRUBINUR n 01/29/2018 1029   KETONESUR NEGATIVE 04/16/2018 0450   PROTEINUR NEGATIVE 04/16/2018 0450   UROBILINOGEN 0.2 01/29/2018 1029   UROBILINOGEN 0.2 05/04/2015 2308   NITRITE NEGATIVE 04/16/2018 0450   LEUKOCYTESUR NEGATIVE 04/16/2018 0450   Sepsis Labs: @LABRCNTIP (procalcitonin:4,lacticidven:4)  ) Recent Results (from the past 240 hour(s))  Culture, blood (routine x 2)     Status: None (Preliminary result)   Collection Time: 04/15/18  7:16 AM  Result Value Ref Range Status   Specimen Description BLOOD BLOOD LEFT FOREARM  Final   Special Requests   Final    BOTTLES DRAWN AEROBIC AND ANAEROBIC Blood Culture adequate  volume   Culture   Final    NO GROWTH 2 DAYS Performed at [facility], [address], [place], [place] [address]    Report Status PENDING  Incomplete  Culture, blood (routine x 2)     Status: None (Preliminary result)   Collection Time: 04/15/18  7:24 AM  Result Value Ref Range Status   Specimen Description BLOOD BLOOD LEFT HAND  Final   Special Requests   Final    BOTTLES DRAWN AEROBIC ONLY Blood Culture adequate volume   Culture   Final    NO GROWTH 2 DAYS Performed at [facility], [address], [place], [place] [address]    Report Status PENDING  Incomplete  Urine culture     Status: Abnormal   Collection Time: 04/16/18  4:50 AM  Result Value Ref Range Status   Specimen Description   Final    URINE, CLEAN CATCH Performed at [facility], [address] [place], [place] [address]    Special Requests   Final    Normal Performed  at [facility], [address], [place], [place] [address]    Culture MULTIPLE SPECIES PRESENT, SUGGEST RECOLLECTION (A)  Final   Report Status 04/17/2018 FINAL  Final         Radiology Studies: Mr Knee Left Wo Contrast  Result Date: 04/16/2018 CLINICAL DATA:  Left knee pain after fall on 04/10/2018 EXAM: MRI OF THE LEFT KNEE WITHOUT CONTRAST TECHNIQUE: Multiplanar, multisequence MR imaging of the knee was performed. No intravenous contrast was administered. COMPARISON:  None. FINDINGS: MENISCI Medial meniscus: Near full-thickness radial tear of the posterior mesial aspect of the medial meniscus, series 6/images 7 and 8. Lateral meniscus:  Intact and of normal morphology. LIGAMENTS Cruciates: Slight posterior bowing of the ACL which is slightly thickened in appearance. A mild ACL sprain may account for this appearance. The PCL appears intact. Collaterals:  Intact MCL and LCL. CARTILAGE Patellofemoral: Diffuse chondral thinning of the cartilage overlying the medial patellar facet. Mild thinning of the trochlear cartilage. No focal defect. Medial: Chondromalacia of the medial femorotibial cartilage series 8/12. Lateral:  Intact focal defect Joint: Moderate to large suprapatellar joint effusion with slightly thickened medial plica. Trace edema of Hoffa's fat pad. Trace fluid in the deep infrapatellar bursa. Popliteal Fossa:  Small popliteal cyst.  Intact popliteus. Extensor Mechanism:  Intact extensor mechanism tendons and MPFL. Bones: No marrow signal abnormality indicative of fracture. No destruction. Other: None IMPRESSION: 1. Near complete full thickness radial tear of the posterior mesial corner of the medial meniscus near the meniscal root. 2. Slight dorsal bowing of the anterior cruciate ligament is slightly thickened in appearance. Mild ACL sprain may account appearance. The PCL is intact. The ligaments are. 3. Medial femorotibial joint space narrowing secondary to diffuse malacia. Mild thinning  is also noted of the cartilage overlying medial patellar facet trochlea. 4. Moderate large suprapatellar joint effusion slightly thickened medial plica. 5. Deep infrapatellar bursitis. 6. Small popliteal cyst. Electronically Signed   By: Ashley Royalty M.D.   On: 04/16/2018 20:28        Scheduled Meds: . aspirin EC  81 mg Oral Daily  . carvedilol  25 mg Oral BID WC  . ezetimibe  10 mg Oral Daily  . ferrous sulfate  325 mg Oral QPM  . furosemide  40 mg Oral Daily  . multivitamin  15 mL Oral Daily  . pantoprazole  40 mg Oral BID  . potassium chloride  40 mEq Oral  Q4H  . simvastatin  20 mg Oral q1800  . temazepam  15 mg Oral QHS  . traMADol  50 mg Oral Q8H   Continuous Infusions:   LOS: 2 days    Time spent: 35 minutes. Greater than 50% of this time was spent in direct contact with the patient, coordinating care and discussing relevant ongoing clinical issues, including results of CT scan and likely need for arthroscopic surgery by orthopedics, plan to replace potassium and reverse warfarin prior to surgery.Lelon Frohlich, MD [facility] Pager [phone]  If 7PM-7AM, please contact night-coverage [email or web address] Password [email or web address] 04/17/2018, 3:08 PM

## 2018-04-18 ENCOUNTER — Inpatient Hospital Stay (HOSPITAL_COMMUNITY): Payer: Medicare Other | Admitting: Anesthesiology

## 2018-04-18 ENCOUNTER — Encounter (HOSPITAL_COMMUNITY)
Admission: EM | Disposition: A | Discharge status: Skilled Nursing Facility | Payer: Self-pay | Source: Home / Self Care | Attending: Internal Medicine

## 2018-04-18 ENCOUNTER — Encounter (HOSPITAL_COMMUNITY): Payer: Self-pay | Admitting: *Deleted

## 2018-04-18 DIAGNOSIS — S83512A Sprain of anterior cruciate ligament of left knee, initial encounter: Secondary | ICD-10-CM | POA: Diagnosis not present

## 2018-04-18 DIAGNOSIS — I10 Essential (primary) hypertension: Secondary | ICD-10-CM | POA: Diagnosis not present

## 2018-04-18 DIAGNOSIS — M23322 Other meniscus derangements, posterior horn of medial meniscus, left knee: Secondary | ICD-10-CM | POA: Diagnosis not present

## 2018-04-18 DIAGNOSIS — S83242D Other tear of medial meniscus, current injury, left knee, subsequent encounter: Secondary | ICD-10-CM

## 2018-04-18 DIAGNOSIS — I2510 Atherosclerotic heart disease of native coronary artery without angina pectoris: Secondary | ICD-10-CM | POA: Diagnosis not present

## 2018-04-18 HISTORY — PX: KNEE ARTHROSCOPY WITH MEDIAL MENISECTOMY: SHX5651

## 2018-04-18 LAB — CBC
HCT: 35.1 % — ABNORMAL LOW (ref 36.0–46.0)
Hemoglobin: 11.8 g/dL — ABNORMAL LOW (ref 12.0–15.0)
MCH: 28.1 pg (ref 26.0–34.0)
MCHC: 33.6 g/dL (ref 30.0–36.0)
MCV: 83.6 fL (ref 80.0–100.0)
PLATELETS: 214 10*3/uL (ref 150–400)
RBC: 4.2 MIL/uL (ref 3.87–5.11)
RDW: 12.2 % (ref 11.5–15.5)
WBC: 5.2 10*3/uL (ref 4.0–10.5)
nRBC: 0 % (ref 0.0–0.2)

## 2018-04-18 LAB — BASIC METABOLIC PANEL
Anion gap: 7 (ref 5–15)
BUN: 15 mg/dL (ref 8–23)
CALCIUM: 8.8 mg/dL — AB (ref 8.9–10.3)
CHLORIDE: 102 mmol/L (ref 98–111)
CO2: 31 mmol/L (ref 22–32)
Creatinine, Ser: 0.85 mg/dL (ref 0.44–1.00)
GFR calc non Af Amer: >60 mL/min (ref >60)
GLUCOSE: 144 mg/dL — AB (ref 70–99)
Potassium: 4.6 mmol/L (ref 3.5–5.1)
Sodium: 140 mmol/L (ref 135–145)

## 2018-04-18 LAB — PROTIME-INR
INR: 1.71
PROTHROMBIN TIME: 19.9 s — AB (ref 11.4–15.2)

## 2018-04-18 SURGERY — ARTHROSCOPY, KNEE, WITH MEDIAL MENISCECTOMY
Anesthesia: General | Laterality: Left

## 2018-04-18 MED ORDER — BUPIVACAINE-EPINEPHRINE (PF) 0.5% -1:200000 IJ SOLN
INTRAMUSCULAR | Status: DC | PRN
  Administered 2018-04-18: 60 mL via PERINEURAL

## 2018-04-18 MED ORDER — LIDOCAINE HCL (PF) 1 % IJ SOLN
INTRAMUSCULAR | Status: AC
  Filled 2018-04-18: qty 5

## 2018-04-18 MED ORDER — LIDOCAINE HCL 1 % IJ SOLN
INTRAMUSCULAR | Status: DC | PRN
  Administered 2018-04-18: 40 mg via INTRADERMAL

## 2018-04-18 MED ORDER — CEFAZOLIN SODIUM-DEXTROSE 2-4 GM/100ML-% IV SOLN
INTRAVENOUS | Status: AC
  Filled 2018-04-18: qty 100

## 2018-04-18 MED ORDER — KETOROLAC TROMETHAMINE 30 MG/ML IJ SOLN: 30.0000 mg | Freq: Once | INTRAMUSCULAR | Status: DC | PRN

## 2018-04-18 MED ORDER — EPINEPHRINE PF 1 MG/ML IJ SOLN
INTRAMUSCULAR | Status: AC
  Filled 2018-04-18: qty 5

## 2018-04-18 MED ORDER — SODIUM CHLORIDE 0.9 % IR SOLN
Status: DC | PRN
  Administered 2018-04-18 (×2): 3000 mL

## 2018-04-18 MED ORDER — LACTATED RINGERS IV SOLN
INTRAVENOUS | Status: DC
  Administered 2018-04-18: 13:00:00 via INTRAVENOUS

## 2018-04-18 MED ORDER — ONDANSETRON HCL 4 MG/2ML IJ SOLN: 4.0000 mg | Freq: Once | INTRAMUSCULAR | Status: DC | PRN

## 2018-04-18 MED ORDER — FENTANYL CITRATE (PF) 100 MCG/2ML IJ SOLN
INTRAMUSCULAR | Status: DC | PRN
  Administered 2018-04-18: 25 ug via INTRAVENOUS

## 2018-04-18 MED ORDER — BUPIVACAINE-EPINEPHRINE (PF) 0.25% -1:200000 IJ SOLN
INTRAMUSCULAR | Status: AC
  Filled 2018-04-18: qty 60

## 2018-04-18 MED ORDER — MEPERIDINE HCL 50 MG/ML IJ SOLN: 6.2500 mg | INTRAMUSCULAR | Status: DC | PRN

## 2018-04-18 MED ORDER — SODIUM CHLORIDE 0.9 % IR SOLN
Status: DC | PRN
  Administered 2018-04-18: 1000 mL

## 2018-04-18 MED ORDER — FENTANYL CITRATE (PF) 100 MCG/2ML IJ SOLN
INTRAMUSCULAR | Status: AC
  Filled 2018-04-18: qty 2

## 2018-04-18 MED ORDER — FENTANYL CITRATE (PF) 100 MCG/2ML IJ SOLN
50.0000 ug | Freq: Once | INTRAMUSCULAR | Status: AC
  Administered 2018-04-18: 50 ug via INTRAVENOUS

## 2018-04-18 MED ORDER — HYDROMORPHONE HCL 1 MG/ML IJ SOLN: 0.2500 mg | INTRAMUSCULAR | Status: DC | PRN

## 2018-04-18 MED ORDER — PROPOFOL 10 MG/ML IV BOLUS
INTRAVENOUS | Status: DC | PRN
  Administered 2018-04-18: 60 mg via INTRAVENOUS

## 2018-04-18 MED ORDER — HYDROCODONE-ACETAMINOPHEN 7.5-325 MG PO TABS: 1.0000 | ORAL_TABLET | Freq: Once | ORAL | Status: DC | PRN

## 2018-04-18 MED ORDER — EPHEDRINE SULFATE 50 MG/ML IJ SOLN
INTRAMUSCULAR | Status: DC | PRN
  Administered 2018-04-18: 15 mg via INTRAVENOUS

## 2018-04-18 SURGICAL SUPPLY — 53 items
ARTHROWAND PARAGON T2 (SURGICAL WAND)
BANDAGE ELASTIC 6 LF NS (GAUZE/BANDAGES/DRESSINGS) ×2 IMPLANT
BLADE AGGRESSIVE PLUS 4.0 (BLADE) ×2 IMPLANT
BLADE SURG SZ11 CARB STEEL (BLADE) ×2 IMPLANT
BNDG CMPR MED 5X6 ELC HKLP NS (GAUZE/BANDAGES/DRESSINGS) ×1
CHLORAPREP W/TINT 26ML (MISCELLANEOUS) ×2 IMPLANT
CLOTH BEACON ORANGE TIMEOUT ST (SAFETY) ×2 IMPLANT
COOLER CRYO IC GRAV AND TUBE (ORTHOPEDIC SUPPLIES) ×2 IMPLANT
CUFF CRYO KNEE18X23 MED (MISCELLANEOUS) ×1 IMPLANT
CUFF TOURNIQUET SINGLE 24IN (TOURNIQUET CUFF) ×1 IMPLANT
CUTTER ANGLED AGGR PLUS 4.0 (BURR) ×1 IMPLANT
CUTTER ANGLED DBL BITE 4.5 (BURR) IMPLANT
DECANTER SPIKE VIAL GLASS SM (MISCELLANEOUS) ×4 IMPLANT
GAUZE 4X4 16PLY RFD (DISPOSABLE) ×2 IMPLANT
GAUZE SPONGE 4X4 12PLY STRL (GAUZE/BANDAGES/DRESSINGS) ×2 IMPLANT
GAUZE SPONGE 4X4 16PLY XRAY LF (GAUZE/BANDAGES/DRESSINGS) ×2 IMPLANT
GAUZE XEROFORM 5X9 LF (GAUZE/BANDAGES/DRESSINGS) ×2 IMPLANT
GLOVE BIOGEL PI IND STRL 7.0 (GLOVE) ×2 IMPLANT
GLOVE BIOGEL PI INDICATOR 7.0 (GLOVE) ×3
GLOVE SKINSENSE NS SZ8.0 LF (GLOVE) ×1
GLOVE SKINSENSE STRL SZ8.0 LF (GLOVE) ×1 IMPLANT
GLOVE SS N UNI LF 8.5 STRL (GLOVE) ×2 IMPLANT
GOWN STRL REUS W/ TWL LRG LVL3 (GOWN DISPOSABLE) ×1 IMPLANT
GOWN STRL REUS W/TWL LRG LVL3 (GOWN DISPOSABLE) ×2
GOWN STRL REUS W/TWL XL LVL3 (GOWN DISPOSABLE) ×2 IMPLANT
HLDR LEG FOAM (MISCELLANEOUS) ×1 IMPLANT
IV NS IRRIG 3000ML ARTHROMATIC (IV SOLUTION) ×4 IMPLANT
KIT BLADEGUARD II DBL (SET/KITS/TRAYS/PACK) ×2 IMPLANT
KIT TURNOVER CYSTO (KITS) ×2 IMPLANT
LEG HOLDER FOAM (MISCELLANEOUS) ×1
MANIFOLD NEPTUNE II (INSTRUMENTS) ×2 IMPLANT
MARKER SKIN DUAL TIP RULER LAB (MISCELLANEOUS) ×2 IMPLANT
NDL HYPO 18GX1.5 BLUNT FILL (NEEDLE) ×1 IMPLANT
NDL HYPO 21X1.5 SAFETY (NEEDLE) ×1 IMPLANT
NDL SPNL 18GX3.5 QUINCKE PK (NEEDLE) ×1 IMPLANT
NEEDLE HYPO 18GX1.5 BLUNT FILL (NEEDLE) ×2 IMPLANT
NEEDLE HYPO 21X1.5 SAFETY (NEEDLE) ×2 IMPLANT
NEEDLE SPNL 18GX3.5 QUINCKE PK (NEEDLE) ×2 IMPLANT
NS IRRIG 1000ML POUR BTL (IV SOLUTION) ×2 IMPLANT
PACK ARTHRO LIMB DRAPE STRL (MISCELLANEOUS) ×2 IMPLANT
PAD ABD 5X9 TENDERSORB (GAUZE/BANDAGES/DRESSINGS) ×2 IMPLANT
PAD ARMBOARD 7.5X6 YLW CONV (MISCELLANEOUS) ×2 IMPLANT
PADDING CAST COTTON 6X4 STRL (CAST SUPPLIES) ×2 IMPLANT
PROBE BIPOLAR 50 DEGREE SUCT (MISCELLANEOUS) IMPLANT
PROBE BIPOLAR ATHRO 135MM 90D (MISCELLANEOUS) IMPLANT
SET ARTHROSCOPY INST (INSTRUMENTS) ×2 IMPLANT
SET BASIN LINEN APH (SET/KITS/TRAYS/PACK) ×2 IMPLANT
SUT ETHILON 3 0 FSL (SUTURE) ×2 IMPLANT
SYR 10ML LL (SYRINGE) ×2 IMPLANT
SYR 30ML LL (SYRINGE) ×2 IMPLANT
TUBE CONNECTING 12X1/4 (SUCTIONS) ×4 IMPLANT
TUBING ARTHRO INFLOW-ONLY STRL (TUBING) ×2 IMPLANT
WAND ARTHRO PARAGON T2 (SURGICAL WAND) IMPLANT

## 2018-04-18 NOTE — Progress Notes (Addendum)
PROGRESS NOTE    DARLENY SEM  UYQ:[id number] DOB: [birth year] DOA: 04/15/2018 PCP: Dorothyann Peng, NP     Brief Narrative:  77 year old woman admitted from home on 10/14.  On 10/9 she suffered a syncopal event and states that at that time she twisted her left knee and has been painful ever since.  She has been unable to bear weight on her knee.  She has a past medical history significant for diastolic heart failure, recurrent PE on warfarin, paroxysmal atrial for relation, hypertension as well as bilateral carotid artery stenosis.  Admission was requested for further evaluation and management.   Assessment & Plan:   Principal Problem:   Syncope and collapse Active Problems:   Acute medial meniscus tear, left, subsequent encounter   PAF (paroxysmal atrial fibrillation) ([name])   Chronic diastolic congestive heart failure ([name])   Mixed hyperlipidemia   GERD (gastroesophageal reflux disease)   Anticoagulated on Coumadin   Aortic valve stenosis   Essential hypertension, benign   CAD (coronary artery disease)   Hypokalemia   Syncopal event -Likely related to significant dehydration and hypokalemia. -Likely secondary to ongoing Lasix use at home without potassium supplementation. -She has not displayed any arrhythmias on telemetry. -2D echo from August 2019 showed an ejection fraction of 50 to 50%. -She has bilateral carotid artery stenosis which does not result in hemodynamically significant stenosis as per ultrasound on 12/2017.  However she should have plans for vascular follow-up after discharge.  Left knee pain -MRI shows a near complete full-thickness radial tear of the posterior medial corner of the medial meniscus.  Slight dorsal bowing of the ACL, PCL is intact.  Deep infrapatellar bursitis. -Discussed case with Dr. Aline Brochure, surgery is planned for 10/17. -Patient is medically cleared to proceed with surgery as INR has dropped to 1.7 today. -Will rely on orthopedics to  tell us when it is safe to resume warfarin post surgery.  Hopefully can be resumed as soon as possible given her history of PE.  Hypokalemia -Continue to replace orally, magnesium is normal.  History of PE/atrial fibrillation -Rate is currently controlled. -Warfarin is currently on hold and was reversed yesterday in anticipation of orthopedic surgery today.  Hyperlipidemia -Continue Vytorin  Chronic diastolic heart failure -Echo as above. -Is compensated and euvolemic.  Continue home furosemide dose.  GERD -Continue PPI  Coronary artery disease -Denies chest pain or shortness of breath. -Continue Vytorin Coreg and aspirin.   DVT prophylaxis: Warfarin Code Status: Full code Family Communication: Patient only Disposition Plan: Anticipate surgery for medial meniscus repair of the left knee on 10/17.  Will likely need SNF after.  Consultants:   Orthopedics  Procedures:   None  Antimicrobials:  Anti-infectives (From admission, onward)   Start     Dose/Rate Route Frequency Ordered Stop   04/18/18 0600  ceFAZolin (ANCEF) IVPB 2g/100 mL premix     2 g 200 mL/hr over 30 Minutes Intravenous On call to O.R. 04/17/18 1757 04/19/18 0559       Subjective: Lying in bed, complains of significant knee pain.  Objective: Vitals:   04/17/18 1328 04/17/18 1332 04/17/18 2154 04/18/18 0626  BP: (!) 72/27 (!) 128/48 (!) 119/54 (!) 114/58  Pulse: 61 71 85 (!) 59  Resp: 18  20 18   Temp: 98.2 F (36.8 C)  98.3 F (36.8 C) 97.8 F (36.6 C)  TempSrc: Oral  Oral Oral  SpO2: 90%   96%  Weight:      Height:  Intake/Output Summary (Last 24 hours) at 04/18/2018 1151 Last data filed at 04/18/2018 0900 Gross per 24 hour  Intake 240 ml  Output 1250 ml  Net -1010 ml   Filed Weights   04/15/18 0529 04/15/18 0900  Weight: 48.5 kg 50.1 kg    Examination:  General exam: Alert, awake, oriented x 3 Respiratory system: Clear to auscultation. Respiratory effort  normal. Cardiovascular system:RRR. No murmurs, rubs, gallops. Gastrointestinal system: Abdomen is nondistended, soft and nontender. No organomegaly or masses felt. Normal bowel sounds heard. Central nervous system: Alert and oriented. No focal neurological deficits. Extremities: No C/C/E, +pedal pulses, left knee is edematous and painful with range of motion. Skin: No rashes, lesions or ulcers Psychiatry: Judgement and insight appear normal. Mood & affect appropriate.      Data Reviewed: I have personally reviewed following labs and imaging studies  CBC: Recent Labs  Lab 04/15/18 0639 04/16/18 0437 04/17/18 0453 04/18/18 0448  WBC 7.5 5.5 4.4 5.2  NEUTROABS 4.8  --   --   --   HGB 13.2 12.5 11.6* 11.8*  HCT 38.4 36.8 34.2* 35.1*  MCV 84.4 85.2 84.9 83.6  PLT 172 190 197 053   Basic Metabolic Panel: Recent Labs  Lab 04/15/18 0639 04/16/18 0437 04/17/18 0453 04/18/18 0448  NA 135 138 139 140  K 2.5* 3.4* 3.0* 4.6  CL 90* 99 98 102  CO2 36* 29 33* 31  GLUCOSE 188* 159* 153* 144*  BUN 12 13 14 15   CREATININE 0.81 0.81 0.80 0.85  CALCIUM 8.7* 8.6* 8.6* 8.8*  MG 2.0 1.9  --   --    GFR: Estimated Creatinine Clearance: 43.1 mL/min (by C-G formula based on SCr of 0.85 mg/dL). Liver Function Tests: Recent Labs  Lab 04/15/18 0639  AST 13*  ALT 10  ALKPHOS 57  BILITOT 1.6*  PROT 6.7  ALBUMIN 3.2*   No results for input(s): LIPASE, AMYLASE in the last 168 hours. No results for input(s): AMMONIA in the last 168 hours. Coagulation Profile: Recent Labs  Lab 04/15/18 0639 04/17/18 0453 04/17/18 1700 04/18/18 0448  INR 2.41 4.37* 3.81 1.71   Cardiac Enzymes: Recent Labs  Lab 04/15/18 0639  CKTOTAL 22*  TROPONINI <0.03   BNP (last 3 results) Recent Labs    03/07/18 0939  PROBNP 268.0*   HbA1C: No results for input(s): HGBA1C in the last 72 hours. CBG: No results for input(s): GLUCAP in the last 168 hours. Lipid Profile: No results for input(s):  CHOL, HDL, LDLCALC, TRIG, CHOLHDL, LDLDIRECT in the last 72 hours. Thyroid Function Tests: No results for input(s): TSH, T4TOTAL, FREET4, T3FREE, THYROIDAB in the last 72 hours. Anemia Panel: No results for input(s): VITAMINB12, FOLATE, FERRITIN, TIBC, IRON, RETICCTPCT in the last 72 hours. Urine analysis:    Component Value Date/Time   COLORURINE YELLOW 04/16/2018 0450   APPEARANCEUR CLEAR 04/16/2018 0450   LABSPEC 1.011 04/16/2018 0450   PHURINE 6.0 04/16/2018 0450   GLUCOSEU NEGATIVE 04/16/2018 0450   HGBUR MODERATE (A) 04/16/2018 0450   BILIRUBINUR NEGATIVE 04/16/2018 0450   BILIRUBINUR n 01/29/2018 1029   KETONESUR NEGATIVE 04/16/2018 0450   PROTEINUR NEGATIVE 04/16/2018 0450   UROBILINOGEN 0.2 01/29/2018 1029   UROBILINOGEN 0.2 05/04/2015 2308   NITRITE NEGATIVE 04/16/2018 0450   LEUKOCYTESUR NEGATIVE 04/16/2018 0450   Sepsis Labs: @LABRCNTIP (procalcitonin:4,lacticidven:4)  ) Recent Results (from the past 240 hour(s))  Culture, blood (routine x 2)     Status: None (Preliminary result)   Collection Time: 04/15/18  7:16 AM  Result Value Ref Range Status   Specimen Description BLOOD BLOOD LEFT FOREARM  Final   Special Requests   Final    BOTTLES DRAWN AEROBIC AND ANAEROBIC Blood Culture adequate volume   Culture   Final    NO GROWTH 3 DAYS Performed at [facility], [address], [place], [place] [address]    Report Status PENDING  Incomplete  Culture, blood (routine x 2)     Status: None (Preliminary result)   Collection Time: 04/15/18  7:24 AM  Result Value Ref Range Status   Specimen Description BLOOD BLOOD LEFT HAND  Final   Special Requests   Final    BOTTLES DRAWN AEROBIC ONLY Blood Culture adequate volume   Culture   Final    NO GROWTH 3 DAYS Performed at [facility], [address], [place], [place] [address]    Report Status PENDING  Incomplete  Urine culture     Status: Abnormal   Collection Time: 04/16/18  4:50 AM  Result Value Ref Range Status    Specimen Description   Final    URINE, CLEAN CATCH Performed at [facility], [address] [place], [place] [address]    Special Requests   Final    Normal Performed at [facility], [address] [place], [place] [address]    Culture MULTIPLE SPECIES PRESENT, SUGGEST RECOLLECTION (A)  Final   Report Status 04/17/2018 FINAL  Final  Surgical pcr screen     Status: None   Collection Time: 04/17/18  5:15 PM  Result Value Ref Range Status   MRSA, PCR NEGATIVE NEGATIVE Final   Staphylococcus aureus NEGATIVE NEGATIVE Final    Comment: (NOTE) The Xpert SA Assay (FDA approved for NASAL specimens in patients 89 years of age and older), is one component of a comprehensive surveillance program. It is not intended to diagnose infection nor to guide or monitor treatment. Performed at [facility], [address] [place], [place] [address]          Radiology Studies: Mr Knee Left Wo Contrast  Result Date: 04/16/2018 CLINICAL DATA:  Left knee pain after fall on 04/10/2018 EXAM: MRI OF THE LEFT KNEE WITHOUT CONTRAST TECHNIQUE: Multiplanar, multisequence MR imaging of the knee was performed. No intravenous contrast was administered. COMPARISON:  None. FINDINGS: MENISCI Medial meniscus: Near full-thickness radial tear of the posterior mesial aspect of the medial meniscus, series 6/images 7 and 8. Lateral meniscus:  Intact and of normal morphology. LIGAMENTS Cruciates: Slight posterior bowing of the ACL which is slightly thickened in appearance. A mild ACL sprain may account for this appearance. The PCL appears intact. Collaterals:  Intact MCL and LCL. CARTILAGE Patellofemoral: Diffuse chondral thinning of the cartilage overlying the medial patellar facet. Mild thinning of the trochlear cartilage. No focal defect. Medial: Chondromalacia of the medial femorotibial cartilage series 8/12. Lateral:  Intact focal defect Joint: Moderate to large suprapatellar joint effusion with slightly thickened medial  plica. Trace edema of Hoffa's fat pad. Trace fluid in the deep infrapatellar bursa. Popliteal Fossa:  Small popliteal cyst.  Intact popliteus. Extensor Mechanism:  Intact extensor mechanism tendons and MPFL. Bones: No marrow signal abnormality indicative of fracture. No destruction. Other: None IMPRESSION: 1. Near complete full thickness radial tear of the posterior mesial corner of the medial meniscus near the meniscal root. 2. Slight dorsal bowing of the anterior cruciate ligament is slightly thickened in appearance. Mild ACL sprain may account appearance. The PCL is intact. The ligaments are. 3.  Medial femorotibial joint space narrowing secondary to diffuse malacia. Mild thinning is also noted of the cartilage overlying medial patellar facet trochlea. 4. Moderate large suprapatellar joint effusion slightly thickened medial plica. 5. Deep infrapatellar bursitis. 6. Small popliteal cyst. Electronically Signed   By: Ashley Royalty M.D.   On: 04/16/2018 20:28        Scheduled Meds: . aspirin EC  81 mg Oral Daily  . carvedilol  25 mg Oral BID WC  . ezetimibe  10 mg Oral Daily  . ferrous sulfate  325 mg Oral QPM  . furosemide  40 mg Oral Daily  . multivitamin  15 mL Oral Daily  . pantoprazole  40 mg Oral BID  . povidone-iodine  2 application Topical Once  . simvastatin  20 mg Oral q1800  . temazepam  15 mg Oral QHS  . traMADol  50 mg Oral Q8H   Continuous Infusions: .  ceFAZolin (ANCEF) IV       LOS: 3 days    Time spent: 25 minutes.     Lelon Frohlich, MD [facility] Pager [phone]  If 7PM-7AM, please contact night-coverage [email or web address] Password [email or web address] 04/18/2018, 11:51 AM

## 2018-04-18 NOTE — Transfer of Care (Signed)
Immediate Anesthesia Transfer of Care Note  Patient: [name]  Procedure(s) Performed: LEFT KNEE ARTHROSCOPY WITH PARTIAL MEDIAL MENISECTOMY AND ANTERIOR CRUCIATE LIGAMENT DEBRIDEMENT (Left )  Patient Location: PACU  Anesthesia Type:General  Level of Consciousness: drowsy  Airway & Oxygen Therapy: Patient Spontanous Breathing and Patient connected to face mask oxygen  Post-op Assessment: Report given to RN  Post vital signs: Reviewed and stable  Last Vitals:  Vitals Value Taken Time  BP    Temp    Pulse 67 04/18/2018  3:13 PM  Resp    SpO2 84 % 04/18/2018  3:13 PM  Vitals shown include unvalidated device data.  Last Pain:  Vitals:   04/18/18 1316  TempSrc:   PainSc: 8       Patients Stated Pain Goal: 3 (15/17/61 6073)  Complications: No apparent anesthesia complications

## 2018-04-18 NOTE — Anesthesia Procedure Notes (Signed)
Procedure Name: LMA Insertion Date/Time: 04/18/2018 2:11 PM Performed by: Charmaine Downs, CRNA Pre-anesthesia Checklist: Patient identified, Patient being monitored, Emergency Drugs available, Timeout performed and Suction available Patient Re-evaluated:Patient Re-evaluated prior to induction Oxygen Delivery Method: Circle System Utilized Preoxygenation: Pre-oxygenation with 100% oxygen Induction Type: IV induction Ventilation: Mask ventilation without difficulty LMA: LMA inserted LMA Size: 4.0 Number of attempts: 1 Placement Confirmation: positive ETCO2 and breath sounds checked- equal and bilateral Tube secured with: Tape Dental Injury: Teeth and Oropharynx as per pre-operative assessment

## 2018-04-18 NOTE — Progress Notes (Addendum)
ANTICOAGULATION CONSULT NOTE -   Pharmacy Consult for  Warfarin dosing Indication: atrial fibrillation  Home Dose:  warfarin 2.5mg  on Sun-Thurs-Sat; warfarin 5mg  on Mon-Tues-Wed-Fri Last dose: warfarin 5mg  on 10-13 Concurrent anti-platelet drugs: aspirin 81mg  daily Drug Interactions: no significant interactions  Allergies  Allergen Reactions  . Iodinated Diagnostic Agents Anaphylaxis and Other (See Comments)    IPD dye Info given by patient  . Ioxaglate Anaphylaxis and Other (See Comments)    Info given by patient  . Red Dye Anaphylaxis  . Lactose Intolerance (Gi)   . Whey Other (See Comments)    Lactose intolerance  . Darvon [Propoxyphene] Rash  . Hydralazine Anxiety and Other (See Comments)    Facial flushing, pt prefers not to use it.   . Milk-Related Compounds Other (See Comments)    Lactose intolerance Can tolerate milk if its cooked into the recipe, just can't drink milk     Patient Measurements: Height: 5\' 2"  (157.5 cm) Weight: 110 lb 7.2 oz (50.1 kg) IBW/kg (Calculated) : 50.1 Heparin Dosing Weight: HEPARIN DW (KG): 50.1  Vital Signs: Temp: 97.8 F (36.6 C) (10/17 0626) Temp Source: Oral (10/17 0626) BP: 114/58 (10/17 0626) Pulse Rate: 59 (10/17 0626)  Labs: Recent Labs    04/16/18 0437 04/17/18 0453 04/17/18 1700 04/18/18 0448  HGB 12.5 11.6*  --  11.8*  HCT 36.8 34.2*  --  35.1*  PLT 190 197  --  214  LABPROT  --  41.1* 36.9* 19.9*  INR  --  4.37* 3.81 1.71  CREATININE 0.81 0.80  --  0.85    Estimated Creatinine Clearance: 43.1 mL/min (by C-G formula based on SCr of 0.85 mg/dL).   Medical History: Past Medical History:  Diagnosis Date  . Anemia 2005   Generally microcytic, transfusions in [address], 2012, 02/2015, 05/2015  . Aortic stenosis    Moderate November 2017  . Arthritis   . Broken back 2013   Chronic back pain.   [name] CAD (coronary artery disease)    Cardiac catheterization 2014 - 80% mid RCA and 70% OM managed medically  . CHF  (congestive heart failure) ([facility])   . Chronic bilateral pleural effusions   . Chronic headache   . Contrast media allergy   . Diastolic heart failure ([place])   . Esophageal cancer ([facility]) 05/06/2015   Adenocarcinoma GE junction  . Facial paresthesia   . GERD (gastroesophageal reflux disease)   . H. pylori infection   . H/O iron deficiency    05-06-15 iron infusion ([facility])  . HH (hiatus hernia) 2008   Large with associated erosions  . Hypertension   . Paroxysmal atrial fibrillation ([name])   . PONV (postoperative nausea and vomiting)   . Pulmonary emboli ([place]) 2008, 2012  . Stroke [facility]) 1982     Assessment: Pharmacy consulted to dose warfarin for this 53 yof on chronic warfarin anticoagulation for paroxysmal atrial fibrillation. Patient takes  warfarin 2.5mg  on Sun-Thurs-Sat and  warfarin 5mg  on Mon-Tues-Wed-Fri. INR supratherapeutic at 1.71.  Vitamin K given 10/16 for surgery.  Goal of Therapy:  INR 2-3 Monitor platelets by anticoagulation protocol: Yes   Plan:  Hold warfarin for surgery Pharmacy will continue to monitor appropriate labs and for signs of bleeding.  Revonda Standard Nadalee Neiswender 04/18/2018,11:28 AM

## 2018-04-18 NOTE — Interval H&P Note (Signed)
History and Physical Interval Note:  04/18/2018 1:54 PM  [name]  has presented today for surgery, with the diagnosis of Medial meniscus tear left knee  The various methods of treatment have been discussed with the patient and family. After consideration of risks, benefits and other options for treatment, the patient has consented to  Procedure(s): KNEE ARTHROSCOPY WITH MEDIAL MENISECTOMY (Left) as a surgical intervention .  The patient's history has been reviewed, patient examined, no change in status, stable for surgery.  I have reviewed the patient's chart and labs.  Questions were answered to the patient's satisfaction.     [name]

## 2018-04-18 NOTE — Op Note (Signed)
04/18/2018  2:59 PM  PATIENT:  [name]  77 y.o. female  PRE-OPERATIVE DIAGNOSIS:  Medial meniscus tear left knee  POST-OPERATIVE DIAGNOSIS:  MEDIAL MENISCUS TEAR AND PARTIAL ANTERIOR CRUCIATE LIGAMENT TEAR LEFT KNEE  PROCEDURE:  Procedure(s): LEFT KNEE ARTHROSCOPY WITH PARTIAL MEDIAL MENISECTOMY AND ANTERIOR CRUCIATE LIGAMENT DEBRIDEMENT (Left) [address]  FINDINGS: Partial tear anterior cruciate ligament appeared to be the anteromedial bundle, posterior horn medial meniscal tear, grade 4 arthritis trochlea and medial femoral condyle  Lateral compartment normal  PCL intact  Medial tibial plateau normal  Knee arthroscopy dictation  The patient was identified in the preoperative holding area using 2 approved identification mechanisms. The chart was reviewed and updated. The surgical site was confirmed as left knee and marked with an indelible marker.  The patient was taken to the operating room for anesthesia. After successful general anesthesia, Ancef 2 g was used as IV antibiotics.  The patient was placed in the supine position with the (left knee left lower extremity) the operative extremity in an arthroscopic leg holder and the opposite extremity in a padded leg holder.  The timeout was executed.  A lateral portal was established with an 11 blade and the scope was introduced into the joint. A diagnostic arthroscopy was performed in circumferential manner examining the entire knee joint. A medial portal was established and the diagnostic arthroscopy was repeated using a probe to palpate intra-articular structures as they were encountered.   Through the medial portal the notch area ACL tissue was debrided  And then The medial meniscus was resected using a duckbill forceps. The meniscal fragments were removed with a motorized shaver. The meniscus was balanced with a motorized shaver Until a stable rim was obtained.   The arthroscopic pump was placed on the wash mode and any  excess debris was removed from the joint using suction.  60 cc of Marcaine with epinephrine was injected through the arthroscope.  The portals were closed with 3-0 nylon suture.  A sterile bandage, Ace wrap and Cryo/Cuff was placed and the Cryo/Cuff was activated. The patient was taken to the recovery room in stable condition.  SURGEON:  Surgeon(s) and Role:    * [name], MD - Primary  PHYSICIAN ASSISTANT:   ASSISTANTS: none   ANESTHESIA:   general  EBL:  none   BLOOD ADMINISTERED:none  DRAINS: none   LOCAL MEDICATIONS USED:  MARCAINE     SPECIMEN:  No Specimen  DISPOSITION OF SPECIMEN:  N/A  COUNTS:  YES  TOURNIQUET:  * Missing tourniquet times found for documented tourniquets in log: [id number] *  DICTATION: .Dragon Dictation  PLAN OF CARE: Admit to inpatient   PATIENT DISPOSITION:  PACU - hemodynamically stable.   Delay start of Pharmacological VTE agent (>24hrs) due to surgical blood loss or risk of bleeding: yes

## 2018-04-18 NOTE — Brief Op Note (Signed)
04/18/2018  2:59 PM  PATIENT:  [name]  77 y.o. female  PRE-OPERATIVE DIAGNOSIS:  Medial meniscus tear left knee  POST-OPERATIVE DIAGNOSIS:  MEDIAL MENISCUS TEAR AND PARTIAL ANTERIOR CRUCIATE LIGAMENT TEAR LEFT KNEE  PROCEDURE:  Procedure(s): LEFT KNEE ARTHROSCOPY WITH PARTIAL MEDIAL MENISECTOMY AND ANTERIOR CRUCIATE LIGAMENT DEBRIDEMENT (Left) 29[address]  FINDINGS: Partial tear anterior cruciate ligament appeared to be the anteromedial bundle, posterior horn medial meniscal tear, grade 4 arthritis trochlea and medial femoral condyle  Lateral compartment normal  PCL intact  Medial tibial plateau normal  Knee arthroscopy dictation  The patient was identified in the preoperative holding area using 2 approved identification mechanisms. The chart was reviewed and updated. The surgical site was confirmed as left knee and marked with an indelible marker.  The patient was taken to the operating room for anesthesia. After successful general anesthesia, Ancef 2 g was used as IV antibiotics.  The patient was placed in the supine position with the (left knee left lower extremity) the operative extremity in an arthroscopic leg holder and the opposite extremity in a padded leg holder.  The timeout was executed.  A lateral portal was established with an 11 blade and the scope was introduced into the joint. A diagnostic arthroscopy was performed in circumferential manner examining the entire knee joint. A medial portal was established and the diagnostic arthroscopy was repeated using a probe to palpate intra-articular structures as they were encountered.   Through the medial portal the notch area ACL tissue was debrided  And then The medial meniscus was resected using a duckbill forceps. The meniscal fragments were removed with a motorized shaver. The meniscus was balanced with a motorized shaver Until a stable rim was obtained.   The arthroscopic pump was placed on the wash mode and any  excess debris was removed from the joint using suction.  60 cc of Marcaine with epinephrine was injected through the arthroscope.  The portals were closed with 3-0 nylon suture.  A sterile bandage, Ace wrap and Cryo/Cuff was placed and the Cryo/Cuff was activated. The patient was taken to the recovery room in stable condition.  SURGEON:  Surgeon(s) and Role:    * [name], MD - Primary  PHYSICIAN ASSISTANT:   ASSISTANTS: none   ANESTHESIA:   general  EBL:  none   BLOOD ADMINISTERED:none  DRAINS: none   LOCAL MEDICATIONS USED:  MARCAINE     SPECIMEN:  No Specimen  DISPOSITION OF SPECIMEN:  N/A  COUNTS:  YES  TOURNIQUET:  * Missing tourniquet times found for documented tourniquets in log: [id number] *  DICTATION: .Dragon Dictation  PLAN OF CARE: Admit to inpatient   PATIENT DISPOSITION:  PACU - hemodynamically stable.   Delay start of Pharmacological VTE agent (>24hrs) due to surgical blood loss or risk of bleeding: yes

## 2018-04-18 NOTE — Anesthesia Preprocedure Evaluation (Signed)
Anesthesia Evaluation  Patient identified by MRN, date of birth, ID band Patient awake    Reviewed: Allergy & Precautions, H&P , NPO status , Patient's Chart, lab work & pertinent test results, reviewed documented beta blocker date and time   History of Anesthesia Complications (+) PONV and history of anesthetic complications  Airway Mallampati: II  TM Distance: >3 FB Neck ROM: full    Dental no notable dental hx.    Pulmonary shortness of breath, pneumonia,    Pulmonary exam normal breath sounds clear to auscultation       Cardiovascular Exercise Tolerance: Good hypertension, + CAD and +CHF   Rhythm:regular Rate:Normal     Neuro/Psych  Headaches,  Neuromuscular disease CVA negative psych ROS   GI/Hepatic Neg liver ROS, hiatal hernia, PUD, GERD  ,  Endo/Other  negative endocrine ROS  Renal/GU negative Renal ROS  negative genitourinary   Musculoskeletal   Abdominal   Peds  Hematology  (+) Blood dyscrasia, anemia ,   Anesthesia Other Findings Echo: - Normal LV EF. Calcified aortic valve, mild by gradients, moderate by valve area.  Reproductive/Obstetrics negative OB ROS                             Anesthesia Physical Anesthesia Plan  ASA: III  Anesthesia Plan: General   Post-op Pain Management:    Induction:   PONV Risk Score and Plan:   Airway Management Planned:   Additional Equipment:   Intra-op Plan:   Post-operative Plan:   Informed Consent: I have reviewed the patients History and Physical, chart, labs and discussed the procedure including the risks, benefits and alternatives for the proposed anesthesia with the patient or authorized representative who has indicated his/her understanding and acceptance.   Dental Advisory Given  Plan Discussed with: CRNA  Anesthesia Plan Comments:         Anesthesia Quick Evaluation

## 2018-04-18 NOTE — NC FL2 (Signed)
[place] LEVEL OF CARE SCREENING TOOL     IDENTIFICATION  Patient Name: [name] Birthdate: 05-11-1941 Sex: female Admission Date (Current Location): 04/15/2018  [facility] and [place] Number:  [employer] and Address:  [facility] [address] [place]      Provider Number: [id number]  Attending Physician Name and Address:  Isaac Bliss, [place]  Relative Name and Phone Number:       Current Level of Care: Hospital Recommended Level of Care: [place] Prior Approval Number:    Date Approved/Denied:   PASRR Number: [id number])  Discharge Plan: SNF    Current Diagnoses: Patient Active Problem List   Diagnosis Date Noted  . Acute medial meniscus tear, left, subsequent encounter 04/18/2018  . Hypokalemia 04/15/2018  . Syncope and collapse 04/15/2018  . Pneumonia 02/18/2018  . Bilateral pleural effusion   . Generalized postprandial abdominal pain   . CAP (community acquired pneumonia) 02/15/2018  . Orthostatic hypotension 12/21/2017  . Peripheral neuropathy 12/21/2017  . CAD (coronary artery disease) 12/19/2017  . Long term (current) use of anticoagulants 03/14/2017  . Numbness of face 01/09/2017  . Unstable angina ([place]) 11/02/2016  . Other fatigue 11/02/2016  . B12 deficiency 10/16/2016  . Preoperative cardiovascular examination 09/21/2016  . Hyperglycemia 06/26/2016  . Atrial fibrillation with RVR ([place]) 06/23/2016  . Encounter for medication review and counseling 06/07/2016  . Essential hypertension, benign 04/19/2016  . Aortic valve stenosis 09/28/2015  . 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 [place] lesion, acute   . IDA (iron deficiency anemia)   . Mixed hyperlipidemia 05/05/2015  . GERD (gastroesophageal reflux disease) 05/05/2015  . Absolute anemia   . Anticoagulated on Coumadin   . Osteoarthritis 04/16/2015  . History of pulmonary embolism 03/02/2015  .  Shortness of breath 02/22/2015  . Benign neoplasm of descending colon 02/17/2015  . Diverticulosis of large intestine without diverticulitis 02/17/2015  . Esophageal stricture 02/16/2015  . UGI bleed 02/13/2015  . Supratherapeutic INR 02/13/2015  . Thoracic back pain 10/15/2014  . Macular degeneration 09/01/2014  . Benign paroxysmal positional vertigo   . Uncontrolled hypertension 08/24/2014  . Complaints of total body pain 08/03/2014  . Sleep disturbance 08/03/2014  . PAF (paroxysmal atrial fibrillation) ([place]) 01/21/2014  . Long term current use of anticoagulant therapy 01/21/2014  . Chronic diastolic congestive heart failure ([place]) 01/21/2014  . Chest pain 01/21/2014    Orientation RESPIRATION BLADDER Height & Weight     Self, Time, Situation, Place  Normal Continent Weight: 110 lb 7.2 oz (50.1 kg) Height:  5\' 2"  (157.5 cm)  BEHAVIORAL SYMPTOMS/MOOD NEUROLOGICAL BOWEL NUTRITION STATUS      Continent (heart healthy)  AMBULATORY STATUS COMMUNICATION OF NEEDS Skin   Limited Assist Verbally Surgical wounds(left knee)                       Personal Care Assistance Level of Assistance  Bathing, Feeding, Dressing Bathing Assistance: Limited assistance Feeding assistance: Independent Dressing Assistance: Limited assistance     Functional Limitations Info  Sight, Hearing, Speech Sight Info: Adequate Hearing Info: Adequate Speech Info: Adequate    SPECIAL CARE FACTORS FREQUENCY  PT (By licensed PT)     PT Frequency: 5x/week               Contractures Contractures Info: Not present    Additional Factors Info  Code Status, Allergies Code Status Info: Full Code Allergies Info: Iodinated Diagnostic  agent, Ioxaglate, red dye, lactose intolerant, Whey, Darvon, Hydralazine, Milk related compounds           Current Medications (04/18/2018):  This is the current hospital active medication list Current Facility-Administered Medications  Medication Dose Route  Frequency Provider Last Rate Last Dose  . [MAR Hold] acetaminophen (TYLENOL) tablet 650 mg  650 mg Oral Q6H PRN Heath Lark D, DO   650 mg at 04/16/18 0859   Or  . [MAR Hold] acetaminophen (TYLENOL) suppository 650 mg  650 mg Rectal Q6H PRN Manuella Ghazi, Pratik D, DO      . [MAR Hold] aspirin EC tablet 81 mg  81 mg Oral Daily Manuella Ghazi, Pratik D, DO   81 mg at 04/17/18 2683  . [MAR Hold] carvedilol (COREG) tablet 25 mg  25 mg Oral BID WC Shah, Pratik D, DO   25 mg at 04/17/18 1713  . [MAR Hold] ezetimibe (ZETIA) tablet 10 mg  10 mg Oral Daily Manuella Ghazi, Pratik D, DO   10 mg at 04/17/18 4196  . [MAR Hold] ferrous sulfate tablet 325 mg  325 mg Oral QPM Shah, Pratik D, DO   325 mg at 04/17/18 1713  . [MAR Hold] furosemide (LASIX) tablet 40 mg  40 mg Oral Daily Manuella Ghazi, Pratik D, DO   40 mg at 04/17/18 2229  . [MAR Hold] gabapentin (NEURONTIN) capsule 300 mg  300 mg Oral Daily PRN Heath Lark D, DO   300 mg at 04/17/18 2222  . HYDROcodone-acetaminophen (NORCO) 7.5-325 MG per tablet 1 tablet  1 tablet Oral Once PRN Nicanor Alcon, MD      . HYDROmorphone (DILAUDID) injection 0.25-0.5 mg  0.25-0.5 mg Intravenous Q5 min PRN Nicanor Alcon, MD      . ketorolac (TORADOL) 30 MG/ML injection 30 mg  30 mg Intravenous Once PRN Nicanor Alcon, MD      . lactated ringers infusion   Intravenous Continuous Nicanor Alcon, MD      . Doug Sou Hold] meclizine (ANTIVERT) tablet 25 mg  25 mg Oral PRN Kathie Dike, MD      . meperidine (DEMEROL) injection 6.25-12.5 mg  6.25-12.5 mg Intravenous Q5 min PRN Nicanor Alcon, MD      . Doug Sou Hold] multivitamin liquid 15 mL  15 mL Oral Daily Manuella Ghazi, Pratik D, DO   15 mL at 04/17/18 7989  . ondansetron (ZOFRAN) injection 4 mg  4 mg Intravenous Once PRN Nicanor Alcon, MD      . Doug Sou Hold] ondansetron [facility]) tablet 4 mg  4 mg Oral Q8H PRN Manuella Ghazi, Pratik D, DO      . [MAR Hold] oxyCODONE (Oxy IR/ROXICODONE) immediate release tablet 5 mg  5 mg Oral Q6H PRN Manuella Ghazi, Pratik D, DO   5 mg at 04/18/18 0544  .  [MAR Hold] pantoprazole (PROTONIX) EC tablet 40 mg  40 mg Oral BID Heath Lark D, DO   40 mg at 04/17/18 2221  . povidone-iodine 10 % swab 2 application  2 application Topical Once [name], MD      . Doug Sou Hold] simvastatin (ZOCOR) tablet 20 mg  20 mg Oral q1800 Manuella Ghazi, Pratik D, DO   20 mg at 04/17/18 1713  . [MAR Hold] temazepam (RESTORIL) capsule 15 mg  15 mg Oral QHS Shah, Pratik D, DO   15 mg at 04/17/18 2221  . [MAR Hold] traMADol (ULTRAM) tablet 50 mg  50 mg Oral Q8H Shah, Pratik D, DO   50 mg at 04/18/18  0143     Discharge Medications: Please see discharge summary for a list of discharge medications.  Relevant Imaging Results:  Relevant Lab Results:   Additional Information SSN [id number] Clydene Pugh, LCSW

## 2018-04-19 ENCOUNTER — Non-Acute Institutional Stay (SKILLED_NURSING_FACILITY): Payer: Medicare Other | Admitting: Internal Medicine

## 2018-04-19 ENCOUNTER — Inpatient Hospital Stay
Admission: RE | Admit: 2018-04-19 | Discharge: 2018-05-08 | Disposition: A | Discharge status: Home / Self Care | Payer: Medicare Other | Source: Ambulatory Visit | Attending: Internal Medicine | Admitting: Internal Medicine

## 2018-04-19 DIAGNOSIS — J90 Pleural effusion, not elsewhere classified: Secondary | ICD-10-CM | POA: Diagnosis not present

## 2018-04-19 DIAGNOSIS — S83242D Other tear of medial meniscus, current injury, left knee, subsequent encounter: Secondary | ICD-10-CM

## 2018-04-19 DIAGNOSIS — D001 Carcinoma in situ of esophagus: Secondary | ICD-10-CM | POA: Diagnosis not present

## 2018-04-19 DIAGNOSIS — M549 Dorsalgia, unspecified: Secondary | ICD-10-CM | POA: Diagnosis not present

## 2018-04-19 DIAGNOSIS — Z4789 Encounter for other orthopedic aftercare: Secondary | ICD-10-CM | POA: Diagnosis not present

## 2018-04-19 DIAGNOSIS — R279 Unspecified lack of coordination: Secondary | ICD-10-CM | POA: Diagnosis not present

## 2018-04-19 DIAGNOSIS — I2510 Atherosclerotic heart disease of native coronary artery without angina pectoris: Secondary | ICD-10-CM | POA: Diagnosis not present

## 2018-04-19 DIAGNOSIS — I480 Paroxysmal atrial fibrillation: Secondary | ICD-10-CM

## 2018-04-19 DIAGNOSIS — D509 Iron deficiency anemia, unspecified: Secondary | ICD-10-CM

## 2018-04-19 DIAGNOSIS — Z7901 Long term (current) use of anticoagulants: Secondary | ICD-10-CM | POA: Diagnosis not present

## 2018-04-19 DIAGNOSIS — I5032 Chronic diastolic (congestive) heart failure: Secondary | ICD-10-CM | POA: Diagnosis not present

## 2018-04-19 DIAGNOSIS — E782 Mixed hyperlipidemia: Secondary | ICD-10-CM | POA: Diagnosis not present

## 2018-04-19 DIAGNOSIS — I060 Rheumatic aortic stenosis: Secondary | ICD-10-CM | POA: Diagnosis not present

## 2018-04-19 DIAGNOSIS — R296 Repeated falls: Secondary | ICD-10-CM | POA: Diagnosis not present

## 2018-04-19 DIAGNOSIS — I110 Hypertensive heart disease with heart failure: Secondary | ICD-10-CM | POA: Diagnosis not present

## 2018-04-19 DIAGNOSIS — M25562 Pain in left knee: Secondary | ICD-10-CM | POA: Diagnosis not present

## 2018-04-19 DIAGNOSIS — I259 Chronic ischemic heart disease, unspecified: Secondary | ICD-10-CM | POA: Diagnosis not present

## 2018-04-19 DIAGNOSIS — Z9181 History of falling: Secondary | ICD-10-CM | POA: Diagnosis not present

## 2018-04-19 DIAGNOSIS — R2681 Unsteadiness on feet: Secondary | ICD-10-CM | POA: Diagnosis not present

## 2018-04-19 DIAGNOSIS — R55 Syncope and collapse: Secondary | ICD-10-CM

## 2018-04-19 DIAGNOSIS — R1310 Dysphagia, unspecified: Secondary | ICD-10-CM | POA: Diagnosis not present

## 2018-04-19 DIAGNOSIS — M1990 Unspecified osteoarthritis, unspecified site: Secondary | ICD-10-CM | POA: Diagnosis not present

## 2018-04-19 DIAGNOSIS — R262 Difficulty in walking, not elsewhere classified: Secondary | ICD-10-CM | POA: Diagnosis not present

## 2018-04-19 DIAGNOSIS — G6289 Other specified polyneuropathies: Secondary | ICD-10-CM | POA: Diagnosis not present

## 2018-04-19 DIAGNOSIS — M6281 Muscle weakness (generalized): Secondary | ICD-10-CM | POA: Diagnosis not present

## 2018-04-19 DIAGNOSIS — I4891 Unspecified atrial fibrillation: Secondary | ICD-10-CM | POA: Diagnosis not present

## 2018-04-19 DIAGNOSIS — K219 Gastro-esophageal reflux disease without esophagitis: Secondary | ICD-10-CM | POA: Diagnosis not present

## 2018-04-19 LAB — CBC
HEMATOCRIT: 36.3 % (ref 36.0–46.0)
Hemoglobin: 12.3 g/dL (ref 12.0–15.0)
MCH: 28.1 pg (ref 26.0–34.0)
MCHC: 33.9 g/dL (ref 30.0–36.0)
MCV: 83.1 fL (ref 80.0–100.0)
NRBC: 0 % (ref 0.0–0.2)
Platelets: 202 10*3/uL (ref 150–400)
RBC: 4.37 MIL/uL (ref 3.87–5.11)
RDW: 12.1 % (ref 11.5–15.5)
WBC: 6.9 10*3/uL (ref 4.0–10.5)

## 2018-04-19 LAB — PROTIME-INR
INR: 1.39
Prothrombin Time: 16.9 seconds — ABNORMAL HIGH (ref 11.4–15.2)

## 2018-04-19 MED ORDER — SODIUM CHLORIDE 0.9 % IR SOLN
Status: DC | PRN
  Administered 2018-04-18 (×2): 3000 mL

## 2018-04-19 MED ORDER — WARFARIN SODIUM 2.5 MG PO TABS: 5.0000 mg | ORAL_TABLET | Freq: Once | ORAL | Status: DC

## 2018-04-19 MED ORDER — OXYCODONE-ACETAMINOPHEN 5-325 MG PO TABS
1.0000 | ORAL_TABLET | Freq: Four times a day (QID) | ORAL | Status: DC | PRN
  Administered 2018-04-19: 1 via ORAL
  Filled 2018-04-19: qty 1

## 2018-04-19 MED ORDER — POTASSIUM CHLORIDE ER 10 MEQ PO TBCR: 20.0000 meq | EXTENDED_RELEASE_TABLET | Freq: Every day | ORAL | 0 refills | Status: DC

## 2018-04-19 MED ORDER — WARFARIN - PHARMACIST DOSING INPATIENT: Freq: Every day | Status: DC

## 2018-04-19 MED ORDER — OXYCODONE HCL 5 MG PO TABS: 5.0000 mg | ORAL_TABLET | Freq: Four times a day (QID) | ORAL | 0 refills | Status: DC | PRN

## 2018-04-19 NOTE — Clinical Social Work Placement (Signed)
   CLINICAL SOCIAL WORK PLACEMENT  NOTE  Date:  04/19/2018  Patient Details  Name: [name] MRN: [id number] Date of Birth: 1941-06-08  Clinical Social Work is seeking post-discharge placement for this patient at the [place] level of care (*CSW will initial, date and re-position this form in  chart as items are completed):  Yes   Patient/family provided with [place] Work Department's list of facilities offering this level of care within the geographic area requested by the patient (or if unable, by the patient's family).  Yes   Patient/family informed of their freedom to choose among providers that offer the needed level of care, that participate in Medicare, Medicaid or managed care program needed by the patient, have an available bed and are willing to accept the patient.  Yes   Patient/family informed of [facility]'s ownership interest in [facility] and [facility], as well as of the fact that they are under no obligation to receive care at these facilities.  PASRR submitted to EDS on 04/17/18     PASRR number received on 04/17/18     Existing PASRR number confirmed on       FL2 transmitted to all facilities in geographic area requested by pt/family on 04/19/18     FL2 transmitted to all facilities within larger geographic area on       Patient informed that his/her managed care company has contracts with or will negotiate with certain facilities, including the following:        Yes   Patient/family informed of bed offers received.  Patient chooses bed at [facility]     Physician recommends and patient chooses bed at      Patient to be transferred to [facility] on 04/19/18.  Patient to be transferred to facility by aph staff     Patient family notified on 04/19/18 of transfer.  Name of family member notified:  ken, son     PHYSICIAN       Additional Comment:  Discharge clinicals sent. LCSW  signing off.   _______________________________________________ Ihor Gully, LCSW 04/19/2018, 3:41 PM

## 2018-04-19 NOTE — Progress Notes (Signed)
This is an acute visit.  Level of care is skilled.  Facility is [facility].  Chief complaint acute visit status post hospitalization for syncope with radial tear of the  left medial meniscus--that required surgery.  History of present illness.  Patient is a pleasant 77 year old female--seen today status post hospitalization for syncope with a medial meniscus tear.  She has a history of coronary artery disease diastolic CHF pulmonary embolism x2 on chronic Coumadin- as well as atrial fibrillation aortic stenosis dyslipidemia adenocarcinoma of the GE junction as well as bilateral carotid artery stenosis in addition to hypertension.  She was recently hospitalized in August for colitis, ischemic and chronic pain.  Apparently she had a syncopal episode on October 9 while walking to the bathroom this happened twice in the same day.  She apparently twisted her left knee with 1 of the falls and apparently had been hurting her ever since the fall.  She apparently tried a lidocaine patch with little relief- and has found it very difficult to ambulate.  Her hospital work-up was thought her syncopal episode was likely related to dehydration and hypokalemia since she had been on Lasix.  Apparently no arrhythmias were noted ejection fraction was 50 to 55% on echo in August.   does have bilateral rotted artery stenosis but did not result in hemodynamically significant stenosis per ultrasound done in June 2019.  MRI did show a complete full-thickness radial tear of the posterior medial corner of the medial meniscus- she underwent surgery on October 17.  Therapy recommended skilled nursing.  She also had hypokalemia which was replaced. .  In regards to chronic diastolic CHF she was continued on Lasix.  .  Currently she is resting in bed comfortably she does appear weak and somewhat tired with the transfer from the hospital- vital signs appear to be stable her blood pressure is somewhat  elevated systolic in the 762U I suspect this could be to the anxiety from the transfer as well as possibly pain   Past Medical History:  Diagnosis Date  . Anemia 2005   Generally microcytic, transfusions in [address], 2012, 02/2015, 05/2015  . Aortic stenosis    Moderate November 2017  . Arthritis   . Broken back 2013   Chronic back pain.   [name] CAD (coronary artery disease)    Cardiac catheterization 2014 - 80% mid RCA and 70% OM managed medically  . CHF (congestive heart failure) ([place])   . Chronic bilateral pleural effusions   . Chronic headache   . Contrast media allergy   . Diastolic heart failure ([place])   . Esophageal cancer ([place]) 05/06/2015   Adenocarcinoma GE junction  . Facial paresthesia   . GERD (gastroesophageal reflux disease)   . H. pylori infection   . H/O iron deficiency    05-06-15 iron infusion ([facility])  . HH (hiatus hernia) 2008   Large with associated erosions  . Hypertension   . Paroxysmal atrial fibrillation ([name])   . PONV (postoperative nausea and vomiting)   . Pulmonary emboli ([place]) 2008, 2012  . Stroke [facility]) 1982         Past Surgical History:  Procedure Laterality Date  . APPENDECTOMY  1950s  . BACK SURGERY    . CARPAL TUNNEL RELEASE Bilateral 1990s  . [place] SURGERY  2014  . CHOLECYSTECTOMY  1980s   open  . COLONOSCOPY N/A 02/17/2015   Procedure: COLONOSCOPY;  Surgeon: [name], MD;  Location: [facility] ENDOSCOPY;  Service: Endoscopy;  Laterality:  N/A;  . ESOPHAGOGASTRODUODENOSCOPY N/A 02/16/2015   Procedure: ESOPHAGOGASTRODUODENOSCOPY (EGD);  Surgeon: [name], MD;  Location: [facility];  Service: Endoscopy;  Laterality: N/A;  . ESOPHAGOGASTRODUODENOSCOPY N/A 05/06/2015   Procedure: ESOPHAGOGASTRODUODENOSCOPY (EGD);  Surgeon: [name], MD;  Location: [facility] ENDOSCOPY;  Service: Endoscopy;  Laterality: N/A;  . EUS N/A 05/20/2015   Procedure: UPPER ENDOSCOPIC ULTRASOUND (EUS) LINEAR;  Surgeon: [name], MD;  Location: [facility] ENDOSCOPY;  Service: Endoscopy;  Laterality: N/A;  . exploratory lab  1950s or 1960s  . GIVENS CAPSULE STUDY N/A 05/06/2015   Procedure: GIVENS CAPSULE STUDY;  Surgeon: [name], MD;  Location: [facility] ENDOSCOPY;  Service: Endoscopy;  Laterality: N/A;  . LEFT HEART CATH AND CORONARY ANGIOGRAPHY N/A 11/08/2016   Procedure: Left Heart Cath and Coronary Angiography;  Surgeon: [name], MD;  Location: [facility] CV LAB;  Service: Cardiovascular;  Laterality: N/A;  . lumbar back surgery  2012  . [place] SURGERY  1991  . TONSILLECTOMY    . TONSILLECTOMY AND ADENOIDECTOMY  1960s     reports that she has never smoked. She has never used smokeless tobacco. She reports that she does not drink alcohol or use drugs.       Allergies  Allergen Reactions  . Iodinated Diagnostic Agents Anaphylaxis and Other (See Comments)    IPD dye Info given by patient  . Ioxaglate Anaphylaxis and Other (See Comments)    Info given by patient  . Red Dye Anaphylaxis  . Lactose Intolerance ([place])   . Whey Other (See Comments)    Lactose intolerance  . Darvon [Propoxyphene] Rash  . Hydralazine Anxiety and Other (See Comments)    Facial flushing, pt prefers not to use it.   . Milk-Related Compounds Other (See Comments)    Lactose intolerance Can tolerate milk if its cooked into the recipe, just can't drink milk          Family History  Problem Relation Age of Onset  . Stroke Mother   . Heart disease Mother   . Emphysema Father   . Ovarian cancer Sister   . Stroke Sister   . Other Child        died at birth     Medication List    TAKE these medications   acetaminophen 500 MG tablet Commonly known as:  TYLENOL Take 500-1,000 mg by mouth daily as needed for mild pain or moderate pain.   aspirin EC 81 MG tablet Take 1 tablet (81 mg total) by mouth daily.   carvedilol 25 MG tablet Commonly known as:  COREG TAKE (1) TABLET BY MOUTH  TWICE DAILY WITH A MEAL.   cyanocobalamin 1000 MCG/ML injection Commonly known as:  (VITAMIN B-12) IM daily 11/29-12/3, then weekly x4, then monthly.   ezetimibe-simvastatin 10-20 MG tablet Commonly known as:  VYTORIN Take 1 tablet by mouth daily.   ferrous sulfate 325 (65 FE) MG tablet Take 325 mg by mouth every evening.   FLINTSTONES PLUS IRON chewable tablet Chew 2 tablets by mouth daily.   furosemide 40 MG tablet Commonly known as:  LASIX TAKE 1 TABLET BY MOUTH ONCE DAILY.   gabapentin 300 MG capsule Commonly known as:  NEURONTIN TAKE 1 CAPSULE BY MOUTH THREE TIMES A DAY. What changed:    how much to take  how to take this  when to take this  reasons to take this  additional instructions   meclizine 25 MG tablet Commonly known as:  ANTIVERT Take 25 mg by mouth as needed for dizziness or nausea.   ondansetron 4 MG tablet Commonly known as:  ZOFRAN Take 1 tablet (4 mg total) by mouth every 8 (eight) hours as needed for nausea or vomiting.   oxyCODONE 5 MG immediate release tablet Commonly known as:  Oxy IR/ROXICODONE Take 1 tablet (5 mg total) by mouth every 6 (six) hours as needed for breakthrough pain.   pantoprazole 40 MG tablet Commonly known as:  PROTONIX TAKE 1 TABLET BY MOUTH TWICE DAILY.   potassium chloride 10 MEQ tablet Commonly known as:  K-DUR Take 2 tablets (20 mEq total) by mouth daily.   promethazine 12.5 MG tablet Commonly known as:  PHENERGAN Take 1 tablet (12.5 mg total) by mouth every 8 (eight) hours as needed for nausea or vomiting.   temazepam 15 MG capsule Commonly known as:  RESTORIL Take 1 capsule (15 mg total) by mouth at bedtime.   traMADol 50 MG tablet Commonly known as:  ULTRAM TAKE (1) TABLET BY MOUTH EVERY EIGHT HOURS.   warfarin 5 MG tablet Commonly known as:  COUMADIN  1 tablet p.o. daily       Review of systems.  In general she is not complaining of fever or chills.  Skin is not  complaining of rashes or itching.  Head ears eyes nose mouth and throat is not complaining of sore throat or visual changes.  Respiratory does not complain of shortness of breath or cough.  Cardiac does not complain of chest pain.  GI is not complaining of abdominal pain nausea vomiting diarrhea constipation.  GU does not complain of dysuria.  Musculoskeletal actually is complaining more of left shoulder discomfort.  Neurologic does not complain of dizziness or headache does complain of weakness.  Psych does not complain of being depressed or anxious does appear to be somewhat tired  Physical exam.  Temperature is 97.7 pulse 79 respirations 18 blood pressure 170/80 O2 saturation is 96% on room air.  General this is a pleasant elderly female in no distress lying in bed.  Her skin is warm and dry she does have wrapping of her left leg- could not appreciate rashes.  Eyes visual acuity appears to be intact sclera and conjunctive are clear.  Oropharynx is clear mucous membranes appear a little dry.  Chest is clear to auscultation there is no labored breathing.  Heart is regular rate and rhythm with occasional irregular beat she does not appear to have significant lower extremity edema.  Abdomen is soft nontender with positive bowel sounds.  GU could not appreciate any rashes or discharge.  Musculoskeletal does have again have her left leg essentially immobilized with the wrapping- appears able to move her other extremities at baseline she is able to turn in bed- evaluation of the left shoulder could not really appreciate any deformity she appears to have normal strength in her left upper extremity  Neurologic is grossly intact her speech is clear no lateralizing findings.  She is somewhat fatigued appearing but alert and responsive.  Psych she is largely alert and oriented pleasant and appropriate   Labs.  April 19, 2018.  INR 1.39-.  WBC 6.9 hemoglobin 12.3  platelets 202.  April 18, 2018.  Sodium 140 potassium 4.6 BUN 15 creatinine 0.85   Assessment and plan.  #1 syncope-thought secondary to dehydration and hypokalemia- arrhythmias were not appreciated on telemetry- he does have bilateral mid artery stenosis but not thought to be hemodynamically significant- recommendation for vascular follow-up  upon discharge.  I do note she does continue on Antivert as needed.  2.  History of radial tear of the posterior medial corner of the medial meniscus on the left- she is status post surgery she will need orthopedic follow-up in skilled nursing therapy- she does have tramadol as needed for pain and oxycodone as needed for breakthrough pain.  3.  Hypokalemia was replaced she continues on Lasix with potassium will update a metabolic panel early next week.  4.  History of pulmonary embolism-as well as atrial fibrillation--- her Coumadin has been restarted at 5 mg a day which apparently is her usual dose most days of the week apparently receives 2.5 mg 1 day a week- INR has just been restarted is 1.39 today will have this updated tomorrow.  5.  Anemia she continues on iron this appears to be stable-with hemoglobin of 12.3 on today's lab.  6.-  History of diastolic CHF at this point she appears to be stable she is on Lasix 40 mg a day as well as 20 mEq of potassium will need to do updated labs early next week.  7.  History of coronary artery disease she continues on Vytorin as well as Coreg and aspirin at this point appears to be asymptomatic denies chest pain.  8.  History of GERD she continues on a proton pump inhibitor.  9.  History of neuropathy continues on Neurontin 3 times daily.  10.  History of insomnia continues on temazepam nightly p.m.  11.  Hypertension-  She is on Coreg this is I suspect for atrial fibrillation  As well-systolic is somewhat elevated today but she has just come from the hospital I suspect transfer has something to do with  this-- at this point will monitor would like to get more readings before becoming more aggressive here   #12 history of left shoulder pain- at this point will monitor-I did discuss this with her son and he would prefer not doing an x-ray for now and monitor for any persistence of discomfort- she does not appear to be in any distress physical exam is fairly unremarkable.  Again she does have tramadol and if needed oxycodone for pain.  [id number] note greater than 35 minutes spent assessing patient reviewing her chart and labs-discussing her status with her son at bedside-and coordinating and formulating a plan of care- of note greater than 50% of time spent coordinating plan of care with input as noted above

## 2018-04-19 NOTE — Clinical Social Work Note (Signed)
LCSW met with patient at length related to her difficulty participating in PT and about how it would be difficult for her to manage at home. Patient stated that she would go home with HHPT through [facility]. LCSW attempted to contact patient's son but received a message stated that number was not accepting calls at this time. LCSW left a message for Mearl Latin, niece.  LCSW spoke with CM who advised that attending had spoken with patient and patient was agreeable to SNF.  LCSW spoke with patient who stated that she would like to be considered at [facility]. She stated that if she did not get in to [facility] she would go home.   LCSW spoke with [name] who stated that patient needed to go to rehab. LCSW discussed that patient stated that if she did not go to [facility], she would go home. LCSW informed that patient would discharge today. Mearl Latin stated that she would speak with patient to attempt to get additional facilities.   Akari Crysler, Clydene Pugh, LCSW

## 2018-04-19 NOTE — Discharge Summary (Addendum)
Physician Discharge Summary  [name] PYP:[id number] DOB: 1941/07/01 DOA: 04/15/2018  PCP: Dorothyann Peng, NP  Admit date: 04/15/2018 Discharge date: 04/19/2018  Time spent: 45 minutes  Recommendations for Outpatient Follow-up:  -To be discharged to skilled nursing facility today for short-term rehabilitation post knee surgery. -Will need follow-up with Dr. Aline Brochure in 2 weeks. -Will need her INR closely monitored at [facility] until therapeutic again.   Discharge Diagnoses:  Principal Problem:   Syncope and collapse Active Problems:   Acute medial meniscus tear, left, subsequent encounter   PAF (paroxysmal atrial fibrillation) ([name])   Chronic diastolic congestive heart failure ([name])   Mixed hyperlipidemia   GERD (gastroesophageal reflux disease)   Anticoagulated on Coumadin   Aortic valve stenosis   Essential hypertension, benign   CAD (coronary artery disease)   Hypokalemia   Discharge Condition: Stable and improved  Filed Weights   04/15/18 0529 04/15/18 0900  Weight: 48.5 kg 50.1 kg    History of present illness:  As per Dr. Manuella Ghazi on 10/14: [name] is a 77 y.o. female with medical history significant for coronary artery disease, chronic diastolic CHF, PE x2 on warfarin for anticoagulation, paroxysmal atrial fibrillation, aortic stenosis, dyslipidemia, adenocarcinoma of the GE junction, bilateral carotid artery stenosis, essential hypertension, prior admission and discharge on 02/18/2018 with suspicion of colitis-likely ischemic, and chronic pain who apparently had a syncopal episode on 10/9 while walking to her bathroom twice in the same day.  She states that she was nauseated prior to the fall and became clammy and diaphoretic.  She states that she fell and twisted her left knee at that time and states that it has been painful ever since.  She is unable to bear weight on the knee since she fell and states that she could make it to the ED when this first  happened due to the severity of pain in her knee.  She states she hit her head and had a lot of pain in her posterior neck region and that this has gotten some worse.  Her knee has also become swollen and is painful to touch.  She has tried placing lidocaine patches with little relief noted.  She typically ambulates with her walker and takes her medications as otherwise prescribed. She denies any chest pain, lightheadedness, palpitations, orthopnea, lower extremity swelling, or other complaints.   ED Course: Temperature in the ED noted to be 100 Fahrenheit and vital signs are otherwise stable.  Laboratory data remarkable for potassium of 2.5, glucose was 188 and INR is therapeutic at 2.4.  CT scan of head and neck as well as x-ray of the left knee are unremarkable.  She has received 40 mEq of IV and oral potassium supplementation with 1 L IV fluid bolus as well as fentanyl for pain relief.  Hospital Course:   Syncopal event -Likely related to significant dehydration and hypokalemia, secondary to ongoing Lasix use at home without potassium supplementation. -She has not displayed any arrhythmias on telemetry. -2D echo from August 2019 showed an ejection fraction of 50 to 50%. -She has bilateral carotid artery stenosis which does not result in hemodynamically significant stenosis as per ultrasound on 12/2017.  However she should have plans for vascular follow-up after discharge.  Left knee pain -MRI shows a near complete full-thickness radial tear of the posterior medial corner of the medial meniscus.  Slight dorsal bowing of the ACL, PCL is intact.  Deep infrapatellar bursitis. -Patient underwent surgery on 10/17, due to  weakness and pain with ambulation PT has recommended SNF which patient has agreed to for short-term rehab purposes.  Hypokalemia -Replaced, daily K supplementation since on daily lasix.  History of PE/atrial fibrillation -Per surgery okay to resume warfarin after it was reversed  in anticipation of surgery. -Will need close monitoring of her INR and Coumadin dosing adjustment.  Hyperlipidemia -Continue Vytorin  Chronic diastolic heart failure -Echo as above. -Is compensated and euvolemic.  Continue home furosemide dose.  GERD -Continue PPI  Coronary artery disease -Denies chest pain or shortness of breath. -Continue Vytorin Coreg and aspirin.  Procedures:  Left knee arthroscopy with partial medial meniscectomy and anterior cruciate ligament debridement on 10/17  Consultations:  Orthopedic surgery  Discharge Instructions  Discharge Instructions    Diet - low sodium heart healthy   Complete by:  As directed    Increase activity slowly   Complete by:  As directed      Allergies as of 04/19/2018      Reactions   Iodinated Diagnostic Agents Anaphylaxis, Other (See Comments)   IPD dye Info given by patient   Ioxaglate Anaphylaxis, Other (See Comments)   Info given by patient   Red Dye Anaphylaxis   Lactose Intolerance (gi)    Whey Other (See Comments)   Lactose intolerance   Darvon [propoxyphene] Rash   Hydralazine Anxiety, Other (See Comments)   Facial flushing, pt prefers not to use it.    Milk-related Compounds Other (See Comments)   Lactose intolerance Can tolerate milk if its cooked into the recipe, just can't drink milk       Medication List    TAKE these medications   acetaminophen 500 MG tablet Commonly known as:  TYLENOL Take 500-1,000 mg by mouth daily as needed for mild pain or moderate pain.   aspirin EC 81 MG tablet Take 1 tablet (81 mg total) by mouth daily.   carvedilol 25 MG tablet Commonly known as:  COREG TAKE (1) TABLET BY MOUTH TWICE DAILY WITH A MEAL.   cyanocobalamin 1000 MCG/ML injection Commonly known as:  (VITAMIN B-12) IM daily 11/29-12/3, then weekly x4, then monthly.   ezetimibe-simvastatin 10-20 MG tablet Commonly known as:  VYTORIN Take 1 tablet by mouth daily.   ferrous sulfate 325 (65 FE)  MG tablet Take 325 mg by mouth every evening.   FLINTSTONES PLUS IRON chewable tablet Chew 2 tablets by mouth daily.   furosemide 40 MG tablet Commonly known as:  LASIX TAKE 1 TABLET BY MOUTH ONCE DAILY.   gabapentin 300 MG capsule Commonly known as:  NEURONTIN TAKE 1 CAPSULE BY MOUTH THREE TIMES A DAY. What changed:    how much to take  how to take this  when to take this  reasons to take this  additional instructions   meclizine 25 MG tablet Commonly known as:  ANTIVERT Take 25 mg by mouth as needed for dizziness or nausea.   ondansetron 4 MG tablet Commonly known as:  ZOFRAN Take 1 tablet (4 mg total) by mouth every 8 (eight) hours as needed for nausea or vomiting.   oxyCODONE 5 MG immediate release tablet Commonly known as:  Oxy IR/ROXICODONE Take 1 tablet (5 mg total) by mouth every 6 (six) hours as needed for breakthrough pain.   pantoprazole 40 MG tablet Commonly known as:  PROTONIX TAKE 1 TABLET BY MOUTH TWICE DAILY.   potassium chloride 10 MEQ tablet Commonly known as:  K-DUR Take 2 tablets (20 mEq total) by mouth daily.  promethazine 12.5 MG tablet Commonly known as:  PHENERGAN Take 1 tablet (12.5 mg total) by mouth every 8 (eight) hours as needed for nausea or vomiting.   temazepam 15 MG capsule Commonly known as:  RESTORIL Take 1 capsule (15 mg total) by mouth at bedtime.   traMADol 50 MG tablet Commonly known as:  ULTRAM TAKE (1) TABLET BY MOUTH EVERY EIGHT HOURS.   warfarin 5 MG tablet Commonly known as:  COUMADIN Take as directed. If you are unsure how to take this medication, talk to your nurse or doctor. Original instructions:  TAKE 1/2 TABLET BY MOUTH ON WEDNESDAY AND 1 TABLET ALL OTHER DAYS OR AS DIRECTED BY ANTICOAGULATION CLINIC. What changed:    how much to take  how to take this  when to take this  additional instructions      Allergies  Allergen Reactions  . Iodinated Diagnostic Agents Anaphylaxis and Other (See  Comments)    IPD dye Info given by patient  . Ioxaglate Anaphylaxis and Other (See Comments)    Info given by patient  . Red Dye Anaphylaxis  . Lactose Intolerance (Gi)   . Whey Other (See Comments)    Lactose intolerance  . Darvon [Propoxyphene] Rash  . Hydralazine Anxiety and Other (See Comments)    Facial flushing, pt prefers not to use it.   . Milk-Related Compounds Other (See Comments)    Lactose intolerance Can tolerate milk if its cooked into the recipe, just can't drink milk     Contact information for follow-up providers    [name], MD. Schedule an appointment as soon as possible for a visit in 2 week(s).   Specialties:  Orthopedic Surgery, Radiology Contact information: [address] [place] [address] [phone]            Contact information for after-discharge care    [facility] .   Service:  Skilled Nursing Contact information: [address] [place] [phone]                   The results of significant diagnostics from this hospitalization (including imaging, microbiology, ancillary and laboratory) are listed below for reference.    Significant Diagnostic Studies: Ct Head Wo Contrast  Result Date: 04/15/2018 CLINICAL DATA:  Patient passed out 5 days ago and fell. Posterior neck pain. EXAM: CT HEAD WITHOUT CONTRAST CT CERVICAL SPINE WITHOUT CONTRAST TECHNIQUE: Multidetector CT imaging of the head and cervical spine was performed following the standard protocol without intravenous contrast. Multiplanar CT image reconstructions of the cervical spine were also generated. COMPARISON:  MRI brain 12/20/2017. CT head 12/19/2017. CT head and cervical spine 10/02/2006 FINDINGS: CT HEAD FINDINGS Brain: Mild cerebral atrophy. Patchy low-attenuation changes in the deep white matter consistent with small vessel ischemic changes and old lacunar infarcts. Old area of  encephalomalacia consistent with old infarct in the right superior cerebellum. No mass-effect or midline shift. No abnormal extra-axial fluid collections. Gray-white matter junctions are distinct. Basal cisterns are not effaced. No acute intracranial hemorrhage. Vascular: Moderate intracranial arterial vascular calcifications are present. Skull: Calvarium appears intact. No acute depressed skull fractures. Sinuses/Orbits: Paranasal sinuses and mastoid air cells are clear. Other: None. CT CERVICAL SPINE FINDINGS Alignment: Normal alignment of the cervical spine and facet joints. C1-2 articulation appears intact. Skull base and vertebrae: Skull base appears intact. No vertebral compression deformities. No focal bone lesion or bone destruction. Soft tissues and spinal  canal: No prevertebral soft tissue swelling. No abnormal paraspinal soft tissue mass or infiltration. Disc levels: Postoperative changes with anterior screw fixation and intervertebral fusion at C3-4 and from C5-C7. Fused segments appear intact. Hardware appears intact. Degenerative changes with disc space narrowing and endplate hypertrophic changes at C7-T1 and T1-2. Degenerative changes at C1-2. Degenerative changes in the facet joints. Upper chest: Lung apices are clear. Other: None. IMPRESSION: 1. No acute intracranial abnormalities. Chronic atrophy and small vessel ischemic changes. 2. Normal alignment of the cervical spine. Postoperative changes with anterior screw fixation and intervertebral fusion at C3-4 and C5-C7. No acute displaced fractures identified. Electronically Signed   By: Lucienne Capers M.D.   On: 04/15/2018 06:36   Ct Cervical Spine Wo Contrast  Result Date: 04/15/2018 CLINICAL DATA:  Patient passed out 5 days ago and fell. Posterior neck pain. EXAM: CT HEAD WITHOUT CONTRAST CT CERVICAL SPINE WITHOUT CONTRAST TECHNIQUE: Multidetector CT imaging of the head and cervical spine was performed following the standard protocol without  intravenous contrast. Multiplanar CT image reconstructions of the cervical spine were also generated. COMPARISON:  MRI brain 12/20/2017. CT head 12/19/2017. CT head and cervical spine 10/02/2006 FINDINGS: CT HEAD FINDINGS Brain: Mild cerebral atrophy. Patchy low-attenuation changes in the deep white matter consistent with small vessel ischemic changes and old lacunar infarcts. Old area of encephalomalacia consistent with old infarct in the right superior cerebellum. No mass-effect or midline shift. No abnormal extra-axial fluid collections. Gray-white matter junctions are distinct. Basal cisterns are not effaced. No acute intracranial hemorrhage. Vascular: Moderate intracranial arterial vascular calcifications are present. Skull: Calvarium appears intact. No acute depressed skull fractures. Sinuses/Orbits: Paranasal sinuses and mastoid air cells are clear. Other: None. CT CERVICAL SPINE FINDINGS Alignment: Normal alignment of the cervical spine and facet joints. C1-2 articulation appears intact. Skull base and vertebrae: Skull base appears intact. No vertebral compression deformities. No focal bone lesion or bone destruction. Soft tissues and spinal canal: No prevertebral soft tissue swelling. No abnormal paraspinal soft tissue mass or infiltration. Disc levels: Postoperative changes with anterior screw fixation and intervertebral fusion at C3-4 and from C5-C7. Fused segments appear intact. Hardware appears intact. Degenerative changes with disc space narrowing and endplate hypertrophic changes at C7-T1 and T1-2. Degenerative changes at C1-2. Degenerative changes in the facet joints. Upper chest: Lung apices are clear. Other: None. IMPRESSION: 1. No acute intracranial abnormalities. Chronic atrophy and small vessel ischemic changes. 2. Normal alignment of the cervical spine. Postoperative changes with anterior screw fixation and intervertebral fusion at C3-4 and C5-C7. No acute displaced fractures identified.  Electronically Signed   By: Lucienne Capers M.D.   On: 04/15/2018 06:36   Ct Knee Left Wo Contrast  Result Date: 04/15/2018 CLINICAL DATA:  Left knee pain and swelling since a fall and twisting injury 5 days ago. Initial encounter. EXAM: CT OF THE LEFT KNEE WITHOUT CONTRAST TECHNIQUE: Multidetector CT imaging of the left knee was performed according to the standard protocol. Multiplanar CT image reconstructions were also generated. COMPARISON:  Plain films left knee earlier today. FINDINGS: Bones/Joint/Cartilage No fracture is identified. No focal bony lesion is seen. There is mild medial compartment joint space narrowing. Mild osteophytosis is seen about the knee. Ligaments Suboptimally assessed by CT. The cruciate and collateral ligaments appear intact. Chondrocalcinosis of the menisci and anterior cruciate ligament is noted. The root of the posterior horn of the medial meniscus is difficult to visualize and may be torn. Muscles and Tendons Intact. Soft tissues Moderate to moderately large  joint effusion is seen. IMPRESSION: Negative for fracture or other acute bony abnormality. Possible tear of the root of the posterior horn of the medial meniscus. This could be better evaluated with nonemergent MRI. Chondrocalcinosis. Moderate to moderately large joint effusion. Electronically Signed   By: Inge Rise M.D.   On: 04/15/2018 10:44   Mr Knee Left Wo Contrast  Result Date: 04/16/2018 CLINICAL DATA:  Left knee pain after fall on 04/10/2018 EXAM: MRI OF THE LEFT KNEE WITHOUT CONTRAST TECHNIQUE: Multiplanar, multisequence MR imaging of the knee was performed. No intravenous contrast was administered. COMPARISON:  None. FINDINGS: MENISCI Medial meniscus: Near full-thickness radial tear of the posterior mesial aspect of the medial meniscus, series 6/images 7 and 8. Lateral meniscus:  Intact and of normal morphology. LIGAMENTS Cruciates: Slight posterior bowing of the ACL which is slightly thickened in  appearance. A mild ACL sprain may account for this appearance. The PCL appears intact. Collaterals:  Intact MCL and LCL. CARTILAGE Patellofemoral: Diffuse chondral thinning of the cartilage overlying the medial patellar facet. Mild thinning of the trochlear cartilage. No focal defect. Medial: Chondromalacia of the medial femorotibial cartilage series 8/12. Lateral:  Intact focal defect Joint: Moderate to large suprapatellar joint effusion with slightly thickened medial plica. Trace edema of Hoffa's fat pad. Trace fluid in the deep infrapatellar bursa. Popliteal Fossa:  Small popliteal cyst.  Intact popliteus. Extensor Mechanism:  Intact extensor mechanism tendons and MPFL. Bones: No marrow signal abnormality indicative of fracture. No destruction. Other: None IMPRESSION: 1. Near complete full thickness radial tear of the posterior mesial corner of the medial meniscus near the meniscal root. 2. Slight dorsal bowing of the anterior cruciate ligament is slightly thickened in appearance. Mild ACL sprain may account appearance. The PCL is intact. The ligaments are. 3. Medial femorotibial joint space narrowing secondary to diffuse malacia. Mild thinning is also noted of the cartilage overlying medial patellar facet trochlea. 4. Moderate large suprapatellar joint effusion slightly thickened medial plica. 5. Deep infrapatellar bursitis. 6. Small popliteal cyst. Electronically Signed   By: Ashley Royalty M.D.   On: 04/16/2018 20:28   Dg Knee Complete 4 Views Left  Result Date: 04/15/2018 CLINICAL DATA:  Left knee pain for 5 days. EXAM: LEFT KNEE - COMPLETE 4+ VIEW COMPARISON:  Left knee radiograph 11/02/2016 FINDINGS: Mild degenerative change of the left knee. No fracture or dislocation. No joint effusion. There are vascular calcifications. IMPRESSION: Mild left knee osteoarthrosis without acute abnormality. Electronically Signed   By: Ulyses Jarred M.D.   On: 04/15/2018 06:44    Microbiology: Recent Results (from the  past 240 hour(s))  Culture, blood (routine x 2)     Status: None (Preliminary result)   Collection Time: 04/15/18  7:16 AM  Result Value Ref Range Status   Specimen Description BLOOD BLOOD LEFT FOREARM  Final   Special Requests   Final    BOTTLES DRAWN AEROBIC AND ANAEROBIC Blood Culture adequate volume   Culture   Final    NO GROWTH 4 DAYS Performed at [facility], [address], [place], [place] [address]    Report Status PENDING  Incomplete  Culture, blood (routine x 2)     Status: None (Preliminary result)   Collection Time: 04/15/18  7:24 AM  Result Value Ref Range Status   Specimen Description BLOOD BLOOD LEFT HAND  Final   Special Requests   Final    BOTTLES DRAWN AEROBIC ONLY Blood Culture adequate volume   Culture   Final  NO GROWTH 4 DAYS Performed at [facility], [address], [place], [place] [address]    Report Status PENDING  Incomplete  Urine culture     Status: Abnormal   Collection Time: 04/16/18  4:50 AM  Result Value Ref Range Status   Specimen Description   Final    URINE, CLEAN CATCH Performed at [facility], [address], [place], [place] [address]    Special Requests   Final    Normal Performed at [facility], [address], [place], [place] [address]    Culture MULTIPLE SPECIES PRESENT, SUGGEST RECOLLECTION (A)  Final   Report Status 04/17/2018 FINAL  Final  Surgical pcr screen     Status: None   Collection Time: 04/17/18  5:15 PM  Result Value Ref Range Status   MRSA, PCR NEGATIVE NEGATIVE Final   Staphylococcus aureus NEGATIVE NEGATIVE Final    Comment: (NOTE) The Xpert SA Assay (FDA approved for NASAL specimens in patients 42 years of age and older), is one component of a comprehensive surveillance program. It is not intended to diagnose infection nor to guide or monitor treatment. Performed at [facility], [address] [place],  [address]      Labs: Basic Metabolic Panel: Recent Labs  Lab 04/15/18 [phone] 04/16/18 0437  04/17/18 0453 04/18/18 0448  NA 135 138 139 140  K 2.5* 3.4* 3.0* 4.6  CL 90* 99 98 102  CO2 36* 29 33* 31  GLUCOSE 188* 159* 153* 144*  BUN 12 13 14 15   CREATININE 0.81 0.81 0.80 0.85  CALCIUM 8.7* 8.6* 8.6* 8.8*  MG 2.0 1.9  --   --    Liver Function Tests: Recent Labs  Lab 04/15/18 0639  AST 13*  ALT 10  ALKPHOS 57  BILITOT 1.6*  PROT 6.7  ALBUMIN 3.2*   No results for input(s): LIPASE, AMYLASE in the last 168 hours. No results for input(s): AMMONIA in the last 168 hours. CBC: Recent Labs  Lab 04/15/18 0639 04/16/18 0437 04/17/18 0453 04/18/18 0448 04/19/18 0446  WBC 7.5 5.5 4.4 5.2 6.9  NEUTROABS 4.8  --   --   --   --   HGB 13.2 12.5 11.6* 11.8* 12.3  HCT 38.4 36.8 34.2* 35.1* 36.3  MCV 84.4 85.2 84.9 83.6 83.1  PLT 172 190 197 214 202   Cardiac Enzymes: Recent Labs  Lab 04/15/18 0639  CKTOTAL 22*  TROPONINI <0.03   BNP: BNP (last 3 results) Recent Labs    02/15/18 1504  BNP 531.0*    ProBNP (last 3 results) Recent Labs    03/07/18 0939  PROBNP 268.0*    CBG: No results for input(s): GLUCAP in the last 168 hours.     Signed:  Lelon Frohlich  [facility] Pager: [phone] 04/19/2018, 4:33 PM

## 2018-04-19 NOTE — Care Management (Addendum)
Per CSW , patient would like to DC home. She has not been able to participate with PT. She would like to discuss with son.  CM placed call to patient's son to discussion disposition. His number is "not accepting calls at this time".  Patient's niece [name] is on contact list, CM left voicemail asking for return call.

## 2018-04-19 NOTE — Progress Notes (Signed)
Patient transported to [facility]. Son at bedside.

## 2018-04-19 NOTE — Progress Notes (Signed)
Report called to [facility],  Family request that they be present when patient transported, patient son notified and states he will be here shortly to accompany patient to [facility].

## 2018-04-19 NOTE — Progress Notes (Signed)
ANTICOAGULATION CONSULT NOTE -   Pharmacy Consult for  Warfarin dosing Indication: atrial fibrillation  Home Dose:  warfarin 2.5mg  on Sun-Thurs-Sat; warfarin 5mg  on Mon-Tues-Wed-Fri Last dose: warfarin 5mg  on 10-13 Concurrent anti-platelet drugs: aspirin 81mg  daily Drug Interactions: no significant interactions  Allergies  Allergen Reactions  . Iodinated Diagnostic Agents Anaphylaxis and Other (See Comments)    IPD dye Info given by patient  . Ioxaglate Anaphylaxis and Other (See Comments)    Info given by patient  . Red Dye Anaphylaxis  . Lactose Intolerance (Gi)   . Whey Other (See Comments)    Lactose intolerance  . Darvon [Propoxyphene] Rash  . Hydralazine Anxiety and Other (See Comments)    Facial flushing, pt prefers not to use it.   . Milk-Related Compounds Other (See Comments)    Lactose intolerance Can tolerate milk if its cooked into the recipe, just can't drink milk     Patient Measurements: Height: 5\' 2"  (157.5 cm) Weight: 110 lb 7.2 oz (50.1 kg) IBW/kg (Calculated) : 50.1 Heparin Dosing Weight: HEPARIN DW (KG): 50.1  Vital Signs: Temp: 97.6 F (36.4 C) (10/18 0548) Temp Source: Oral (10/18 0548) BP: 133/62 (10/18 0548) Pulse Rate: 92 (10/18 0548)  Labs: Recent Labs    04/17/18 0453 04/17/18 1700 04/18/18 0448 04/19/18 0446  HGB 11.6*  --  11.8* 12.3  HCT 34.2*  --  35.1* 36.3  PLT 197  --  214 202  LABPROT 41.1* 36.9* 19.9* 16.9*  INR 4.37* 3.81 1.71 1.39  CREATININE 0.80  --  0.85  --     Estimated Creatinine Clearance: 43.1 mL/min (by C-G formula based on SCr of 0.85 mg/dL).   Medical History: Past Medical History:  Diagnosis Date  . Anemia 2005   Generally microcytic, transfusions in [address], 2012, 02/2015, 05/2015  . Aortic stenosis    Moderate November 2017  . Arthritis   . Broken back 2013   Chronic back pain.   [name] CAD (coronary artery disease)    Cardiac catheterization 2014 - 80% mid RCA and 70% OM managed medically  . CHF  (congestive heart failure) ([place])   . Chronic bilateral pleural effusions   . Chronic headache   . Contrast media allergy   . Diastolic heart failure ([place])   . Esophageal cancer ([place]) 05/06/2015   Adenocarcinoma GE junction  . Facial paresthesia   . GERD (gastroesophageal reflux disease)   . H. pylori infection   . H/O iron deficiency    05-06-15 iron infusion ([facility])  . HH (hiatus hernia) 2008   Large with associated erosions  . Hypertension   . Paroxysmal atrial fibrillation ([name])   . PONV (postoperative nausea and vomiting)   . Pulmonary emboli ([place]) 2008, 2012  . Stroke [facility]) 1982     Assessment: Pharmacy consulted to dose warfarin for this 39 yof on chronic warfarin anticoagulation for paroxysmal atrial fibrillation. Patient takes  warfarin 2.5mg  on Sun-Thurs-Sat and  warfarin 5mg  on Mon-Tues-Wed-Fri. Patient had surgery on knee 10/17 and can continue warfarin today.  Vitamin K given 10/16 for surgery.  Goal of Therapy:  INR 2-3 Monitor platelets by anticoagulation protocol: Yes   Plan:  Warfarin 5 mg x 1 dose Pharmacy will continue to monitor appropriate labs and for signs of bleeding.  Revonda Standard Shakeerah Gradel 04/19/2018,10:58 AM

## 2018-04-19 NOTE — Progress Notes (Signed)
Physical Therapy Treatment Patient Details Name: [name] MRN: [id number] DOB: 1940-09-17 Today's Date: 04/19/2018    History of Present Illness Patient is a 77 year old female admitted 04/15/2018 after a fall 04/10/2018 at home after a syncopal episode. PMH: a-fib, CHF, HLD, GERD, HTN, CAD, aortic valve stensosis, PE.     PT Comments    REASSESSMENT:  Patient s/p  LEFT KNEE ARTHROSCOPY WITH PARTIAL MEDIAL MENISECTOMY AND ANTERIOR CRUCIATE LIGAMENT DEBRIDEMENT (Left) 04/18/18.  Patient limited to in bed activity only due to c/o severe pain in left knee even after receiving all morning pain medication.  Patient demonstrates slow labored movement for moving LLE and poor tolerance having left leg moved by writer due to increased pain.  Patient struggled for approximately 10 minutes to move left foot on top of right foot in an attempt to move LLE using RLE for sitting up at bedside, afterwards, patient unable to move legs by herself and refused assistance from writer and tolerated long sitting for 2-3 minutes.  Patient required Max assist to pull to head of bed using chuck to reposition in bed.  Patient will benefit from continued physical therapy in hospital and recommended venue below to increase strength, balance, endurance for safe ADLs and gait.   Follow Up Recommendations  SNF;Supervision/Assistance - 24 hour;Supervision for mobility/OOB     Equipment Recommendations       Recommendations for Other Services       Precautions / Restrictions Precautions Precautions: Fall Restrictions Weight Bearing Restrictions: No    Mobility  Bed Mobility Overal bed mobility: Needs Assistance Bed Mobility: Supine to Sit;Sit to Supine     Supine to sit: Min guard Sit to supine: Min guard   General bed mobility comments: Patient limited to long sitting in bed, unable to tolerate moving legs off bed due to left knee pain  Transfers                    Ambulation/Gait                  Stairs             Wheelchair Mobility    Modified Rankin (Stroke Patients Only)       Balance Overall balance assessment: Needs assistance Sitting-balance support: Feet unsupported;Bilateral upper extremity supported Sitting balance-Leahy Scale: Fair Sitting balance - Comments: able to long sit in bed                                    Cognition Arousal/Alertness: Awake/alert Behavior During Therapy: WFL for tasks assessed/performed Overall Cognitive Status: Within Functional Limits for tasks assessed                                        Exercises      General Comments        Pertinent Vitals/Pain Pain Assessment: Faces Faces Pain Scale: Hurts even more Pain Location: left knee with movement Pain Descriptors / Indicators: Sore;Discomfort;Grimacing;Guarding Pain Intervention(s): Premedicated before session;Limited activity within patient's tolerance;Monitored during session    Home Living                      Prior Function            PT Goals (current goals can now  be found in the care plan section) Acute Rehab PT Goals Patient Stated Goal: return home PT Goal Formulation: With patient Time For Goal Achievement: 04/29/18 Potential to Achieve Goals: Fair Progress towards PT goals: Progressing toward goals    Frequency    Min 3X/week      PT Plan Current plan remains appropriate    Co-evaluation              AM-PAC PT "6 Clicks" Daily Activity  Outcome Measure  Difficulty turning over in bed (including adjusting bedclothes, sheets and blankets)?: A Lot Difficulty moving from lying on back to sitting on the side of the bed? : A Lot Difficulty sitting down on and standing up from a chair with arms (e.g., wheelchair, bedside commode, etc,.)?: Unable Help needed moving to and from a bed to chair (including a wheelchair)?: Total Help needed walking in hospital room?: Total Help  needed climbing 3-5 steps with a railing? : Total 6 Click Score: 8    End of Session   Activity Tolerance: Patient limited by pain;Patient limited by fatigue Patient left: in bed;with call bell/phone within reach;with bed alarm set Nurse Communication: Mobility status PT Visit Diagnosis: Pain;Difficulty in walking, not elsewhere classified (R26.2);Repeated falls (R29.6);Muscle weakness (generalized) (M62.81);Unsteadiness on feet (R26.81) Pain - Right/Left: Left Pain - part of body: Knee     Time: 1133-1200 PT Time Calculation (min) (ACUTE ONLY): 27 min  Charges:  $Therapeutic Activity: 23-37 mins                     3:04 PM, 04/19/18 Lonell Grandchild, MPT Physical Therapist with [facility] [phone] office [phone] mobile phone

## 2018-04-19 NOTE — Anesthesia Postprocedure Evaluation (Signed)
Anesthesia Post Note  Patient: [name]  Procedure(s) Performed: LEFT KNEE ARTHROSCOPY WITH PARTIAL MEDIAL MENISECTOMY AND ANTERIOR CRUCIATE LIGAMENT DEBRIDEMENT (Left )  Patient location during evaluation: Nursing Unit Anesthesia Type: General Level of consciousness: awake and alert and patient cooperative Pain management: satisfactory to patient Vital Signs Assessment: post-procedure vital signs reviewed and stable Respiratory status: spontaneous breathing Cardiovascular status: stable Postop Assessment: no apparent nausea or vomiting Anesthetic complications: no     Last Vitals:  Vitals:   04/18/18 2128 04/19/18 0548  BP: 134/60 133/62  Pulse: 88 92  Resp: 15 15  Temp: 37 C 36.4 C  SpO2: 94% 96%    Last Pain:  Vitals:   04/19/18 0548  TempSrc: Oral  PainSc:                  Drucie Opitz

## 2018-04-20 ENCOUNTER — Encounter (HOSPITAL_COMMUNITY)
Admission: RE | Admit: 2018-04-20 | Discharge: 2018-04-20 | Disposition: A | Discharge status: Home / Self Care | Payer: Medicare Other | Source: Skilled Nursing Facility | Attending: Internal Medicine | Admitting: Internal Medicine

## 2018-04-20 DIAGNOSIS — Z7901 Long term (current) use of anticoagulants: Secondary | ICD-10-CM | POA: Insufficient documentation

## 2018-04-20 LAB — CULTURE, BLOOD (ROUTINE X 2)
CULTURE: NO GROWTH
Culture: NO GROWTH
SPECIAL REQUESTS: ADEQUATE
Special Requests: ADEQUATE

## 2018-04-20 LAB — PROTIME-INR
INR: 1.25
Prothrombin Time: 15.6 seconds — ABNORMAL HIGH (ref 11.4–15.2)

## 2018-04-21 ENCOUNTER — Encounter (HOSPITAL_COMMUNITY)
Admission: RE | Admit: 2018-04-21 | Discharge: 2018-04-21 | Disposition: A | Discharge status: Home / Self Care | Payer: Medicare Other | Source: Skilled Nursing Facility

## 2018-04-21 ENCOUNTER — Encounter: Payer: Self-pay | Admitting: Internal Medicine

## 2018-04-21 LAB — PROTIME-INR
INR: 1.36
Prothrombin Time: 16.6 seconds — ABNORMAL HIGH (ref 11.4–15.2)

## 2018-04-22 ENCOUNTER — Encounter (HOSPITAL_COMMUNITY)
Admission: RE | Admit: 2018-04-22 | Discharge: 2018-04-22 | Disposition: A | Discharge status: Home / Self Care | Payer: Medicare Other | Source: Skilled Nursing Facility | Attending: Internal Medicine | Admitting: Internal Medicine

## 2018-04-22 ENCOUNTER — Non-Acute Institutional Stay (SKILLED_NURSING_FACILITY): Payer: Medicare Other | Admitting: Internal Medicine

## 2018-04-22 ENCOUNTER — Encounter: Payer: Self-pay | Admitting: Internal Medicine

## 2018-04-22 ENCOUNTER — Other Ambulatory Visit: Payer: Self-pay

## 2018-04-22 DIAGNOSIS — I2510 Atherosclerotic heart disease of native coronary artery without angina pectoris: Secondary | ICD-10-CM | POA: Diagnosis not present

## 2018-04-22 DIAGNOSIS — I5032 Chronic diastolic (congestive) heart failure: Secondary | ICD-10-CM | POA: Diagnosis not present

## 2018-04-22 DIAGNOSIS — Z4789 Encounter for other orthopedic aftercare: Secondary | ICD-10-CM | POA: Insufficient documentation

## 2018-04-22 DIAGNOSIS — G6289 Other specified polyneuropathies: Secondary | ICD-10-CM | POA: Diagnosis not present

## 2018-04-22 DIAGNOSIS — S83242D Other tear of medial meniscus, current injury, left knee, subsequent encounter: Secondary | ICD-10-CM | POA: Insufficient documentation

## 2018-04-22 DIAGNOSIS — I480 Paroxysmal atrial fibrillation: Secondary | ICD-10-CM | POA: Diagnosis not present

## 2018-04-22 DIAGNOSIS — R55 Syncope and collapse: Secondary | ICD-10-CM

## 2018-04-22 DIAGNOSIS — Z9181 History of falling: Secondary | ICD-10-CM | POA: Insufficient documentation

## 2018-04-22 DIAGNOSIS — Z7901 Long term (current) use of anticoagulants: Secondary | ICD-10-CM | POA: Insufficient documentation

## 2018-04-22 LAB — PROTIME-INR
INR: 1.51
Prothrombin Time: 18.1 seconds — ABNORMAL HIGH (ref 11.4–15.2)

## 2018-04-22 MED ORDER — OXYCODONE HCL 5 MG PO TABS: 5.0000 mg | ORAL_TABLET | Freq: Four times a day (QID) | ORAL | 0 refills | Status: DC | PRN

## 2018-04-22 NOTE — Progress Notes (Signed)
Provider:Anjali Lyndel Safe MD   Location:    [facility] Room Number: 742/V Place of Service:  SNF (31)  PCP: Dorothyann Peng, NP Patient Care Team: Dorothyann Peng, NP as PCP - General (Family Medicine) Debara Pickett Nadean Corwin, MD as PCP - Cardiology (Cardiology) Kyung Rudd, MD as Consulting Physician (Radiation Oncology) Grace Isaac, MD as Consulting Physician (Cardiothoracic Surgery) Ladell Pier, MD as Consulting Physician (Oncology) Stark Klein, MD as Consulting Physician (General Surgery)  Extended Emergency Contact Information Primary Emergency Contact: Plummer,Ken Address: [address]          [place], [place] [address] Johnnette Litter of [place] Phone: [phone] Mobile Phone: [phone] Relation: Son Secondary Emergency Contact: [facility] Mobile Phone: [phone] Relation: Niece  Code Status: Full Code Goals of Care: Advanced Directive information Advanced Directives 04/22/2018  Does Patient Have a Medical Advance Directive? Yes  Type of Advance Directive (No Data)  Does patient want to make changes to medical advance directive? No - Patient declined  Copy of [place] in Chart? -  Would patient like information on creating a medical advance directive? No - Patient declined      Chief Complaint  Patient presents with  . New Admit To SNF    New Admission Visit    HPI: Patient is a 77 y.o. female seen today for admission to SNF for therapy. Patient was in the hospital from 10/14-10/18 for Syncope and Acute Medial Meniscus repair.  Patient has h/o PAF and CHF with EF of 50-55 %,Moderate Aortic Stenosis, Hypertension, CAD, h/o PE, Adenocarcinoma of GE junction and Hyperlipidemia.  Patient says  she felt dizzy and fell at home. ? Syncope. She felt Pain and swelling in her Left Knee. Her Syncope was thought to be due to Dehydration and Hypokalemia.  Her MRI of left Knee showed Medial Meniscus Tear. She  underwent Arthroscopic Repair on 10/17.  She is now in SNF for therapy. Patient was very teary today and said her only sister is very sick and not doing well. She said that due to this she has not been eating well and loosing weight. She also continuous to have Pain in her Left Knee with therapy. She lives with her Son.Was walking with the walker. Has 1 level home .      Past Medical History:  Diagnosis Date  . Anemia 2005   Generally microcytic, transfusions in [address], 2012, 02/2015, 05/2015  . Aortic stenosis    Moderate November 2017  . Arthritis   . Broken back 2013   Chronic back pain.   [name] CAD (coronary artery disease)    Cardiac catheterization 2014 - 80% mid RCA and 70% OM managed medically  . CHF (congestive heart failure) ([place])   . Chronic bilateral pleural effusions   . Chronic headache   . Contrast media allergy   . Diastolic heart failure ([place])   . Esophageal cancer ([place]) 05/06/2015   Adenocarcinoma GE junction  . Facial paresthesia   . GERD (gastroesophageal reflux disease)   . H. pylori infection   . H/O iron deficiency    05-06-15 iron infusion ([facility])  . HH (hiatus hernia) 2008   Large with associated erosions  . Hypertension   . Paroxysmal atrial fibrillation ([name])   . PONV (postoperative nausea and vomiting)   . Pulmonary emboli ([place]) 2008, 2012  . Stroke [facility]) 1982   Past Surgical History:  Procedure Laterality Date  . APPENDECTOMY  1950s  . BACK SURGERY    .  CARPAL TUNNEL RELEASE Bilateral 1990s  . [place] SURGERY  2014  . CHOLECYSTECTOMY  1980s   open  . COLONOSCOPY N/A 02/17/2015   Procedure: COLONOSCOPY;  Surgeon: [name], MD;  Location: [facility] ENDOSCOPY;  Service: Endoscopy;  Laterality: N/A;  . ESOPHAGOGASTRODUODENOSCOPY N/A 02/16/2015   Procedure: ESOPHAGOGASTRODUODENOSCOPY (EGD);  Surgeon: [name], MD;  Location: [facility];  Service: Endoscopy;  Laterality: N/A;  . ESOPHAGOGASTRODUODENOSCOPY N/A 05/06/2015   Procedure:  ESOPHAGOGASTRODUODENOSCOPY (EGD);  Surgeon: [name], MD;  Location: [facility] ENDOSCOPY;  Service: Endoscopy;  Laterality: N/A;  . EUS N/A 05/20/2015   Procedure: UPPER ENDOSCOPIC ULTRASOUND (EUS) LINEAR;  Surgeon: [name], MD;  Location: [facility] ENDOSCOPY;  Service: Endoscopy;  Laterality: N/A;  . exploratory lab  1950s or 1960s  . GIVENS CAPSULE STUDY N/A 05/06/2015   Procedure: GIVENS CAPSULE STUDY;  Surgeon: [name], MD;  Location: [facility] ENDOSCOPY;  Service: Endoscopy;  Laterality: N/A;  . LEFT HEART CATH AND CORONARY ANGIOGRAPHY N/A 11/08/2016   Procedure: Left Heart Cath and Coronary Angiography;  Surgeon: [name], MD;  Location: [facility] CV LAB;  Service: Cardiovascular;  Laterality: N/A;  . lumbar back surgery  2012  . [place] SURGERY  1991  . TONSILLECTOMY    . TONSILLECTOMY AND ADENOIDECTOMY  1960s    reports that she has never smoked. She has never used smokeless tobacco. She reports that she does not drink alcohol or use drugs. Social History   Socioeconomic History  . Marital status: Widowed    Spouse name: Not on file  . Number of children: 3  . Years of education: 64  . Highest education level: Not on file  Occupational History  . Occupation: Retired  Scientific laboratory technician  . Financial resource strain: Not on file  . Food insecurity:    Worry: Not on file    Inability: Not on file  . Transportation needs:    Medical: Not on file    Non-medical: Not on file  Tobacco Use  . Smoking status: Never Smoker  . Smokeless tobacco: Never Used  Substance and Sexual Activity  . Alcohol use: No    Alcohol/week: 0.0 standard drinks  . Drug use: No  . Sexual activity: Not Currently  Lifestyle  . Physical activity:    Days per week: Not on file    Minutes per session: Not on file  . Stress: Not on file  Relationships  . Social connections:    Talks on phone: Not on file    Gets together: Not on file    Attends religious service: Not on file    Active member of  club or organization: Not on file    Attends meetings of clubs or organizations: Not on file    Relationship status: Not on file  . Intimate partner violence:    Fear of current or ex partner: Not on file    Emotionally abused: Not on file    Physically abused: Not on file    Forced sexual activity: Not on file  Other Topics Concern  . Not on file  Social History Narrative   Born and raised in [place], [place]. Currently resides in a house with her son. 1 dog. Fun: go to church   Divorced(Has total of #3 children)-[place], [place], Honor Junes   Denies religious beliefs that would effect health care.    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[employer]  Functional Status Survey:    Family History  Problem Relation Age of Onset  . Stroke Mother   . Heart disease Mother   . Emphysema Father   . Ovarian cancer Sister   . Stroke Sister   . Other Child        died at birth    Health Maintenance  Topic Date Due  . DEXA SCAN  08/26/2004  . INFLUENZA VACCINE  05/23/2018 (Originally 01/31/2018)  . URINE MICROALBUMIN  05/23/2018 (Originally 08[birth year]26/1949)  . TETANUS/TDAP  05/23/2018 (Originally 08/26/1958)  . PNA vac Low Risk Adult (2 of 2 - PPSV23) 05/02/2018    Allergies  Allergen Reactions  . Iodinated Diagnostic Agents Anaphylaxis and Other (See Comments)    IPD dye Info given by patient  . Ioxaglate Anaphylaxis and Other (See Comments)    Info given by patient  . Red Dye Anaphylaxis  . Lactose Intolerance (Gi)   . Whey Other (See Comments)    Lactose intolerance  . Darvon [Propoxyphene] Rash  . Hydralazine Anxiety and Other (See Comments)    Facial flushing, pt prefers not to use it.   . Milk-Related Compounds Other (See Comments)    Lactose intolerance Can tolerate milk if its cooked into the recipe, just can't drink milk     Allergies as of 04/22/2018      Reactions   Iodinated Diagnostic Agents  Anaphylaxis, Other (See Comments)   IPD dye Info given by patient   Ioxaglate Anaphylaxis, Other (See Comments)   Info given by patient   Red Dye Anaphylaxis   Lactose Intolerance (gi)    Whey Other (See Comments)   Lactose intolerance   Darvon [propoxyphene] Rash   Hydralazine Anxiety, Other (See Comments)   Facial flushing, pt prefers not to use it.    Milk-related Compounds Other (See Comments)   Lactose intolerance Can tolerate milk if its cooked into the recipe, just can't drink milk       Medication List        Accurate as of 04/22/18  9:25 AM. Always use your most recent med list.          acetaminophen 500 MG tablet Commonly known as:  TYLENOL Take 500-1,000 mg by mouth daily as needed for mild pain or moderate pain.   aspirin EC 81 MG tablet Take 1 tablet (81 mg total) by mouth daily.   carvedilol 25 MG tablet Commonly known as:  COREG TAKE (1) TABLET BY MOUTH TWICE DAILY WITH A MEAL.   ezetimibe-simvastatin 10-20 MG tablet Commonly known as:  VYTORIN Take 1 tablet by mouth daily.   ferrous sulfate 325 (65 FE) MG tablet Take 325 mg by mouth every evening.   FLINTSTONES PLUS IRON chewable tablet Chew 2 tablets by mouth daily.   furosemide 40 MG tablet Commonly known as:  LASIX TAKE 1 TABLET BY MOUTH ONCE DAILY.   gabapentin 300 MG capsule Commonly known as:  NEURONTIN TAKE 1 CAPSULE BY MOUTH THREE TIMES A DAY.   meclizine 25 MG tablet Commonly known as:  ANTIVERT Take 25 mg by mouth as needed for dizziness or nausea.   ondansetron 4 MG tablet Commonly known as:  ZOFRAN Take 1 tablet (4 mg total) by mouth every 8 (eight) hours as needed for nausea or vomiting.   oxyCODONE 5 MG immediate release tablet Commonly known as:  Oxy IR/ROXICODONE Take 1 tablet (5 mg total) by mouth every 6 (six) hours as needed for breakthrough pain.   pantoprazole 40  MG tablet Commonly known as:  PROTONIX TAKE 1 TABLET BY MOUTH TWICE DAILY.   potassium chloride 10  MEQ tablet Commonly known as:  K-DUR Take 2 tablets (20 mEq total) by mouth daily.   promethazine 12.5 MG tablet Commonly known as:  PHENERGAN Take 1 tablet (12.5 mg total) by mouth every 8 (eight) hours as needed for nausea or vomiting.   temazepam 15 MG capsule Commonly known as:  RESTORIL Take 1 capsule (15 mg total) by mouth at bedtime.   traMADol 50 MG tablet Commonly known as:  ULTRAM TAKE (1) TABLET BY MOUTH EVERY EIGHT HOURS.   warfarin 5 MG tablet Commonly known as:  COUMADIN Take as directed by the anticoagulation clinic. If you are unsure how to take this medication, talk to your nurse or doctor. Original instructions:  TAKE 1/2 TABLET BY MOUTH ON WEDNESDAY AND 1 TABLET ALL OTHER DAYS OR AS DIRECTED BY ANTICOAGULATION CLINIC.       Review of Systems  Constitutional: Positive for activity change and appetite change.  HENT: Negative.   Respiratory: Negative.   Cardiovascular: Negative.   Gastrointestinal: Negative.   Genitourinary: Negative.   Musculoskeletal: Positive for gait problem.  Skin: Negative.   Neurological: Positive for weakness.  Psychiatric/Behavioral: Positive for dysphoric mood and sleep disturbance. The patient is nervous/anxious.     Vitals:   04/22/18 0916  BP: 109/64  Pulse: 93  Resp: 18  Temp: (!) 97 F (36.1 C)  TempSrc: Oral  SpO2: 96%   There is no height or weight on file to calculate BMI. Physical Exam  Constitutional: She is oriented to person, place, and time. She appears well-developed and well-nourished.  HENT:  Head: Normocephalic.  Mouth/Throat: Oropharynx is clear and moist.  Eyes: Pupils are equal, round, and reactive to light.  Neck: Neck supple.  Cardiovascular: Normal rate. An irregular rhythm present.  No murmur heard. Pulmonary/Chest: Effort normal and breath sounds normal. No stridor. No respiratory distress. She has no wheezes. She has no rales.  Abdominal: Soft. Bowel sounds are normal. She exhibits no  distension. There is no tenderness. There is no guarding.  Musculoskeletal: She exhibits no edema.  Has left Knee Swelling   Neurological: She is alert and oriented to person, place, and time.  Had No Focal Deficits  Psychiatric: Her speech is delayed. She is slowed. She exhibits a depressed mood.    Labs reviewed: Basic Metabolic Panel: Recent Labs    12/19/17 1820  04/15/18 0639 04/16/18 0437 04/17/18 0453 04/18/18 0448  NA  --    < > 135 138 139 140  K  --    < > 2.5* 3.4* 3.0* 4.6  CL  --    < > 90* 99 98 102  CO2  --    < > 36* 29 33* 31  GLUCOSE  --    < > 188* 159* 153* 144*  BUN  --    < > 12 13 14 15   CREATININE  --    < > 0.81 0.81 0.80 0.85  CALCIUM  --    < > 8.7* 8.6* 8.6* 8.8*  MG 2.0  --  2.0 1.9  --   --   PHOS 4.3  --   --   --   --   --    < > = values in this interval not displayed.   Liver Function Tests: Recent Labs    02/15/18 1504 02/18/18 0527 04/15/18 0639  AST 17 16 13*  ALT 16 13 10   ALKPHOS 79 67 57  BILITOT 0.9 0.7 1.6*  PROT 6.5 6.0* 6.7  ALBUMIN 3.6 3.4* 3.2*   Recent Labs    02/15/18 1504  LIPASE 22   No results for input(s): AMMONIA in the last 8760 hours. CBC: Recent Labs    02/21/18 1145 03/07/18 1111 04/15/18 0639  04/17/18 0453 04/18/18 0448 04/19/18 0446  WBC 5.6 6.0 7.5   < > 4.4 5.2 6.9  NEUTROABS 3.2 3.1 4.8  --   --   --   --   HGB 13.4 14.3 13.2   < > 11.6* 11.8* 12.3  HCT 39.1 42.2 38.4   < > 34.2* 35.1* 36.3  MCV 86.6 85.1 84.4   < > 84.9 83.6 83.1  PLT 171.0 226.0 172   < > 197 214 202   < > = values in this interval not displayed.   Cardiac Enzymes: Recent Labs    02/16/18 1023 02/16/18 1556 04/15/18 0639  CKTOTAL 35*  --  22*  TROPONINI <0.03 <0.03 <0.03   BNP: Invalid input(s): POCBNP Lab Results  Component Value Date   HGBA1C 6.2 02/22/2018   Lab Results  Component Value Date   TSH 1.18 09/05/2017   Lab Results  Component Value Date   VITAMINB12 >1500 (H) 05/02/2017   Lab Results    Component Value Date   FOLATE 16.9 05/27/2016   Lab Results  Component Value Date   IRON 233 (H) 10/24/2016   TIBC 290 10/24/2016   FERRITIN 105 02/05/2017    Imaging and Procedures obtained prior to SNF admission: No results found.  Assessment/Plan Syncope and Fall Her work up was negative Will continue to follow Started to work with therapy  PAF (paroxysmal atrial fibrillation) on Coumadin Will restart her Home dose and Follow INR  Chronic diastolic congestive heart failure  On Home dose of Lasix and Potassium Will follow BMP CAD On Coreg,Aspirin and Statin  Acute medial meniscus tear, left,  Pain Controlled on Oxycodone. Therapy Started Depression Will start her on Paxil. Continue Restoril Hyperlipidemia On Vytorin ? Neuropathy Patient on high Dose of Neurontin.  Family/ staff Communication:   Labs/tests ordered:  Total time spent in this patient care encounter was 45_ minutes; greater than 50% of the visit spent counseling patient, reviewing records , Labs and coordinating care for problems addressed at this encounter.

## 2018-04-22 NOTE — Telephone Encounter (Signed)
RX Fax for [facility]@ [phone]  

## 2018-04-23 ENCOUNTER — Ambulatory Visit: Payer: Medicare Other | Admitting: Internal Medicine

## 2018-04-23 ENCOUNTER — Other Ambulatory Visit: Payer: Self-pay

## 2018-04-23 ENCOUNTER — Other Ambulatory Visit: Payer: Self-pay | Admitting: *Deleted

## 2018-04-23 ENCOUNTER — Encounter

## 2018-04-23 DIAGNOSIS — Z760 Encounter for issue of repeat prescription: Secondary | ICD-10-CM

## 2018-04-23 MED ORDER — TRAMADOL HCL 50 MG PO TABS: 50.0000 mg | ORAL_TABLET | Freq: Three times a day (TID) | ORAL | 0 refills | Status: DC

## 2018-04-23 MED ORDER — TEMAZEPAM 15 MG PO CAPS: 15.0000 mg | ORAL_CAPSULE | Freq: Every day | ORAL | 1 refills | Status: DC

## 2018-04-23 NOTE — Telephone Encounter (Signed)
RX Fax for [facility]@ [phone]  

## 2018-04-23 NOTE — Patient Outreach (Signed)
[facility]) Care Management  04/23/2018  [name] 1940/09/20 [id number]   Onsite facility visit with Christinia Gully, RN [facility])  Patient sitting in w/c during visit, alert and oriented Patient lives with her son and 2 grandchildren age 78 and 64. She reports she will go back to live there at discharge  RNCM reviewed [place] management program. Left a packet Will follow up with patient at next facility visit. Royetta Crochet. Laymond Purser, RN, BSN, [place] [phone]) Business Cell  [phone]) Toll Free Office

## 2018-04-24 ENCOUNTER — Telehealth: Payer: Self-pay | Admitting: Orthopedic Surgery

## 2018-04-24 NOTE — Telephone Encounter (Signed)
[facility]  AROM  PROM AS TOLERATED

## 2018-04-24 NOTE — Telephone Encounter (Signed)
Faxed to nurse Roselyn Reef per her request.

## 2018-04-24 NOTE — Telephone Encounter (Signed)
Roselyn Reef, nursing supervisor, [facility] called to request orders, via fax, if possible, regarding range of motion to left leg, asking if patient can do passive or active; also asking if needs brace, and asking what is weight bearing status. Ph# [phone], fax# [phone].  Aware of patient's appointment scheduled 05/03/18.

## 2018-04-26 ENCOUNTER — Ambulatory Visit: Payer: Medicare Other | Admitting: Pulmonary Disease

## 2018-04-29 ENCOUNTER — Other Ambulatory Visit (HOSPITAL_COMMUNITY)
Admission: RE | Admit: 2018-04-29 | Discharge: 2018-04-29 | Disposition: A | Discharge status: Home / Self Care | Payer: Medicare Other | Source: Skilled Nursing Facility | Attending: Internal Medicine | Admitting: Internal Medicine

## 2018-04-29 LAB — CBC WITH DIFFERENTIAL/PLATELET
ABS IMMATURE GRANULOCYTES: 0.01 10*3/uL (ref 0.00–0.07)
Basophils Absolute: 0 10*3/uL (ref 0.0–0.1)
Basophils Relative: 1 %
Eosinophils Absolute: 0.1 10*3/uL (ref 0.0–0.5)
Eosinophils Relative: 1 %
HCT: 35.5 % — ABNORMAL LOW (ref 36.0–46.0)
HEMOGLOBIN: 11.6 g/dL — AB (ref 12.0–15.0)
Immature Granulocytes: 0 %
LYMPHS ABS: 1.7 10*3/uL (ref 0.7–4.0)
LYMPHS PCT: 38 %
MCH: 28.6 pg (ref 26.0–34.0)
MCHC: 32.7 g/dL (ref 30.0–36.0)
MCV: 87.7 fL (ref 80.0–100.0)
MONOS PCT: 8 %
Monocytes Absolute: 0.4 10*3/uL (ref 0.1–1.0)
NEUTROS ABS: 2.3 10*3/uL (ref 1.7–7.7)
Neutrophils Relative %: 52 %
Platelets: 227 10*3/uL (ref 150–400)
RBC: 4.05 MIL/uL (ref 3.87–5.11)
RDW: 12.7 % (ref 11.5–15.5)
WBC: 4.4 10*3/uL (ref 4.0–10.5)
nRBC: 0 % (ref 0.0–0.2)

## 2018-04-29 LAB — BASIC METABOLIC PANEL
ANION GAP: 9 (ref 5–15)
BUN: 11 mg/dL (ref 8–23)
CHLORIDE: 102 mmol/L (ref 98–111)
CO2: 30 mmol/L (ref 22–32)
CREATININE: 0.83 mg/dL (ref 0.44–1.00)
Calcium: 8.9 mg/dL (ref 8.9–10.3)
GFR calc non Af Amer: >60 mL/min (ref >60)
Glucose, Bld: 144 mg/dL — ABNORMAL HIGH (ref 70–99)
Potassium: 4 mmol/L (ref 3.5–5.1)
SODIUM: 141 mmol/L (ref 135–145)

## 2018-04-29 LAB — PROTIME-INR
INR: 2.5
PROTHROMBIN TIME: 26.6 s — AB (ref 11.4–15.2)

## 2018-05-02 ENCOUNTER — Other Ambulatory Visit: Payer: Self-pay | Admitting: *Deleted

## 2018-05-02 NOTE — Patient Outreach (Signed)
[name]) Care Management  05/02/2018  [name] [birth year] [id number]   Onsite visit with patient at facility.  She reports that she is worried about going home. She does not understand [facility] and how she became "signed up" for it.  RNCM reviewed [facility] care management and that patient's provider is part of [facility] and with her Medicare plan It makes her part of the [place].  Patient voices concerns about being not ready to go home and asking about who makes decisions. RNCM discussed collaboration between facility and [facility] and that Medicare has guidelines around skilled nursing facility benefits and these are strictly followed. Also reaffirmed that she would get at minimum a 48 hour notice before discharge and that this RNCM is from [facility] CM and not involved in the issuing of discharge notices. She voices understanding.  Patient verbalized that her son did not get a care plan meeting yesterday as scheduled but he did call and speak with therapy.  Patient verbalizes she has a walker and needs a wheelchair. RNCM discussed that she should let the SW know the DME need and they should be able to send an order at discharge for one if she qualifies.   RNCM reviewed [facility] CM and patient accepted another packet. She does not want to accept services at this time. She verbalizes understanding that she can call in the future with an needs.   RNCM did speak with Bard Herbert, SW at facility and reviewed patient concern about notification for discharge.   Will collaborate with SW regarding DME question and [facility] care team as indicated and sign off.   Royetta Crochet. Laymond Purser, RN, BSN, [place] [phone]) Business Cell  [phone]) Toll Free Office

## 2018-05-03 ENCOUNTER — Ambulatory Visit (INDEPENDENT_AMBULATORY_CARE_PROVIDER_SITE_OTHER): Payer: Medicare Other | Admitting: Orthopedic Surgery

## 2018-05-03 ENCOUNTER — Encounter: Payer: Self-pay | Admitting: Orthopedic Surgery

## 2018-05-03 VITALS — BP 117/59 | HR 74 | Ht 62.0 in

## 2018-05-03 DIAGNOSIS — S83242D Other tear of medial meniscus, current injury, left knee, subsequent encounter: Secondary | ICD-10-CM

## 2018-05-03 DIAGNOSIS — M238X2 Other internal derangements of left knee: Secondary | ICD-10-CM

## 2018-05-03 DIAGNOSIS — Z9889 Other specified postprocedural states: Secondary | ICD-10-CM

## 2018-05-03 DIAGNOSIS — M1710 Unilateral primary osteoarthritis, unspecified knee: Secondary | ICD-10-CM

## 2018-05-03 NOTE — Progress Notes (Signed)
Chief Complaint  Patient presents with  . Routine Post Op    left knee arthroscopy 04/18/18    77 year old female fell injured her left knee during her work-up for her syncope she was found to have a torn medial meniscus old ACL tear if not acute ACL tear and underwent arthroscopic surgery after several days waiting for her warfarin to be corrected  We found a partial tear anterior cruciate ligament anteromedial bundle posterior horn medial meniscal tear grade 4 arthritis trochlea medial femoral condyle  She comes in today with a flexion contracture and decreased flexion of the knee complaining of severe pain at night  Her wound looks good she had no swelling or effusion she had painful range of motion throughout the range of motion from 20 to 70 degrees  She will need extensive physical therapy I ordered a brace for support follow-up with me in 6 weeks  I think she can take the gabapentin tramadol Tylenol during the day and oxycodone at night  Encounter Diagnoses  Name Primary?  . S/P left knee arthroscopy and ACL debridement 04/18/18 Yes  . Acute medial meniscus tear, left, subsequent encounter   . Primary localized osteoarthritis of knee   . Laxity of left anterior cruciate ligament

## 2018-05-06 ENCOUNTER — Encounter (HOSPITAL_COMMUNITY)
Admission: RE | Admit: 2018-05-06 | Discharge: 2018-05-06 | Disposition: A | Discharge status: Home / Self Care | Payer: Medicare Other | Source: Skilled Nursing Facility | Attending: Internal Medicine | Admitting: Internal Medicine

## 2018-05-06 DIAGNOSIS — Z7901 Long term (current) use of anticoagulants: Secondary | ICD-10-CM | POA: Insufficient documentation

## 2018-05-06 LAB — BASIC METABOLIC PANEL
ANION GAP: 8 (ref 5–15)
BUN: 12 mg/dL (ref 8–23)
CALCIUM: 9.1 mg/dL (ref 8.9–10.3)
CO2: 31 mmol/L (ref 22–32)
Chloride: 101 mmol/L (ref 98–111)
Creatinine, Ser: 0.67 mg/dL (ref 0.44–1.00)
GFR calc non Af Amer: >60 mL/min (ref >60)
Glucose, Bld: 153 mg/dL — ABNORMAL HIGH (ref 70–99)
POTASSIUM: 3.9 mmol/L (ref 3.5–5.1)
Sodium: 140 mmol/L (ref 135–145)

## 2018-05-06 LAB — CBC WITH DIFFERENTIAL/PLATELET
ABS IMMATURE GRANULOCYTES: 0.01 10*3/uL (ref 0.00–0.07)
BASOS ABS: 0 10*3/uL (ref 0.0–0.1)
Basophils Relative: 0 %
Eosinophils Absolute: 0.1 10*3/uL (ref 0.0–0.5)
Eosinophils Relative: 2 %
HEMATOCRIT: 37.8 % (ref 36.0–46.0)
Hemoglobin: 12.2 g/dL (ref 12.0–15.0)
IMMATURE GRANULOCYTES: 0 %
LYMPHS ABS: 1.5 10*3/uL (ref 0.7–4.0)
LYMPHS PCT: 28 %
MCH: 27.7 pg (ref 26.0–34.0)
MCHC: 32.3 g/dL (ref 30.0–36.0)
MCV: 85.9 fL (ref 80.0–100.0)
Monocytes Absolute: 0.4 10*3/uL (ref 0.1–1.0)
Monocytes Relative: 8 %
NEUTROS ABS: 3.3 10*3/uL (ref 1.7–7.7)
NRBC: 0 % (ref 0.0–0.2)
Neutrophils Relative %: 62 %
Platelets: 224 10*3/uL (ref 150–400)
RBC: 4.4 MIL/uL (ref 3.87–5.11)
RDW: 13.2 % (ref 11.5–15.5)
WBC: 5.4 10*3/uL (ref 4.0–10.5)

## 2018-05-06 LAB — PROTIME-INR
INR: 1.73
Prothrombin Time: 20.1 seconds — ABNORMAL HIGH (ref 11.4–15.2)

## 2018-05-07 ENCOUNTER — Encounter: Payer: Self-pay | Admitting: Internal Medicine

## 2018-05-07 ENCOUNTER — Non-Acute Institutional Stay (SKILLED_NURSING_FACILITY): Payer: Medicare Other | Admitting: Internal Medicine

## 2018-05-07 DIAGNOSIS — R55 Syncope and collapse: Secondary | ICD-10-CM

## 2018-05-07 DIAGNOSIS — I4891 Unspecified atrial fibrillation: Secondary | ICD-10-CM | POA: Diagnosis not present

## 2018-05-07 DIAGNOSIS — I5032 Chronic diastolic (congestive) heart failure: Secondary | ICD-10-CM

## 2018-05-07 DIAGNOSIS — E782 Mixed hyperlipidemia: Secondary | ICD-10-CM

## 2018-05-07 NOTE — Progress Notes (Signed)
Location:    [place] Room Number: 102/P Place of Service:  SNF (31)  Provider: Veleta Miners MD  PCP: Dorothyann Peng, NP Patient Care Team: Dorothyann Peng, NP as PCP - General (Family Medicine) Debara Pickett Nadean Corwin, MD as PCP - Cardiology (Cardiology) Kyung Rudd, MD as Consulting Physician (Radiation Oncology) Grace Isaac, MD as Consulting Physician (Cardiothoracic Surgery) Ladell Pier, MD as Consulting Physician (Oncology) Stark Klein, MD as Consulting Physician (General Surgery)  Extended Emergency Contact Information Primary Emergency Contact: Plummer,Ken Address: [address]          [place], [place] [address] Johnnette Litter of [place] Phone: [phone] Mobile Phone: [phone] Relation: Son Secondary Emergency Contact: [facility] Mobile Phone: [phone] Relation: Niece  Code Status: Full Code Goals of care:  Advanced Directive information Advanced Directives 05/07/2018  Does Patient Have a Medical Advance Directive? Yes  Type of Advance Directive (No Data)  Does patient want to make changes to medical advance directive? No - Patient declined  Copy of [place] in Chart? -  Would patient like information on creating a medical advance directive? No - Patient declined     Allergies  Allergen Reactions  . Iodinated Diagnostic Agents Anaphylaxis and Other (See Comments)    IPD dye Info given by patient  . Ioxaglate Anaphylaxis and Other (See Comments)    Info given by patient  . Red Dye Anaphylaxis  . Lactose Intolerance (Gi)   . Whey Other (See Comments)    Lactose intolerance  . Darvon [Propoxyphene] Rash  . Hydralazine Anxiety and Other (See Comments)    Facial flushing, pt prefers not to use it.   . Milk-Related Compounds Other (See Comments)    Lactose intolerance Can tolerate milk if its cooked into the recipe, just can't drink milk     Chief Complaint  Patient presents with  .  Discharge Note    HPI:  77 y.o. female  For Discharge from SNF   Patient was in the hospital from 10/14-10/18 for Syncope and Acute Medial Meniscus repair.  Patient has h/o PAF and CHF with EF of 50-55 %,Moderate Aortic Stenosis, Hypertension, CAD, h/o PE, Adenocarcinoma of GE junction and Hyperlipidemia.  Patient says  she felt dizzy and fell at home. ? Syncope. She felt Pain and swelling in her Left Knee. Her Syncope was thought to be due to Dehydration and Hypokalemia.  Her MRI of left Knee showed Medial Meniscus Tear. She underwent Arthroscopic Repair on 10/17.  She was in SNF for therapy but is now getting Discharged home as he sister has died and she wants to be with her Family. She was started on Paxil for depression due to her sister. She is doing good therapy and is walking almost 100 feet with Walker. She is going home with her son. Her pain is controlled with Oxycodone and Ultram.PRN No Episode of Syncope.or Dizziness    Past Medical History:  Diagnosis Date  . Anemia 2005   Generally microcytic, transfusions in [address], 2012, 02/2015, 05/2015  . Aortic stenosis    Moderate November 2017  . Arthritis   . Broken back 2013   Chronic back pain.   [name] CAD (coronary artery disease)    Cardiac catheterization 2014 - 80% mid RCA and 70% OM managed medically  . CHF (congestive heart failure) ([facility])   . Chronic bilateral pleural effusions   . Chronic headache   . Contrast media allergy   . Diastolic heart failure (  [facility])   . Esophageal cancer ([facility]) 05/06/2015   Adenocarcinoma GE junction  . Facial paresthesia   . GERD (gastroesophageal reflux disease)   . H. pylori infection   . H/O iron deficiency    05-06-15 iron infusion ([facility])  . HH (hiatus hernia) 2008   Large with associated erosions  . Hypertension   . Paroxysmal atrial fibrillation ([name])   . PONV (postoperative nausea and vomiting)   . Pulmonary emboli ([facility]) 2008, 2012  . Stroke [facility] 1982    Past Surgical  History:  Procedure Laterality Date  . APPENDECTOMY  1950s  . BACK SURGERY    . CARPAL TUNNEL RELEASE Bilateral 1990s  . [place] SURGERY  2014  . CHOLECYSTECTOMY  1980s   open  . COLONOSCOPY N/A 02/17/2015   Procedure: COLONOSCOPY;  Surgeon: [name], MD;  Location: [facility] ENDOSCOPY;  Service: Endoscopy;  Laterality: N/A;  . ESOPHAGOGASTRODUODENOSCOPY N/A 02/16/2015   Procedure: ESOPHAGOGASTRODUODENOSCOPY (EGD);  Surgeon: [name], MD;  Location: [facility];  Service: Endoscopy;  Laterality: N/A;  . ESOPHAGOGASTRODUODENOSCOPY N/A 05/06/2015   Procedure: ESOPHAGOGASTRODUODENOSCOPY (EGD);  Surgeon: [name], MD;  Location: [facility] ENDOSCOPY;  Service: Endoscopy;  Laterality: N/A;  . EUS N/A 05/20/2015   Procedure: UPPER ENDOSCOPIC ULTRASOUND (EUS) LINEAR;  Surgeon: [name], MD;  Location: [facility] ENDOSCOPY;  Service: Endoscopy;  Laterality: N/A;  . exploratory lab  1950s or 1960s  . GIVENS CAPSULE STUDY N/A 05/06/2015   Procedure: GIVENS CAPSULE STUDY;  Surgeon: [name], MD;  Location: [facility] ENDOSCOPY;  Service: Endoscopy;  Laterality: N/A;  . KNEE ARTHROSCOPY WITH MEDIAL MENISECTOMY Left 04/18/2018   Procedure: LEFT KNEE ARTHROSCOPY WITH PARTIAL MEDIAL MENISECTOMY AND ANTERIOR CRUCIATE LIGAMENT DEBRIDEMENT;  Surgeon: [name], MD;  Location: [facility] ORS;  Service: Orthopedics;  Laterality: Left;  . LEFT HEART CATH AND CORONARY ANGIOGRAPHY N/A 11/08/2016   Procedure: Left Heart Cath and Coronary Angiography;  Surgeon: [name], MD;  Location: [facility] CV LAB;  Service: Cardiovascular;  Laterality: N/A;  . lumbar back surgery  2012  . [place] SURGERY  1991  . TONSILLECTOMY    . TONSILLECTOMY AND ADENOIDECTOMY  1960s      reports that she has never smoked. She has never used smokeless tobacco. She reports that she does not drink alcohol or use drugs. Social History   Socioeconomic History  . Marital status: Widowed    Spouse name: Not on file  .  Number of children: 3  . Years of education: 3  . Highest education level: Not on file  Occupational History  . Occupation: Retired  Scientific laboratory technician  . Financial resource strain: Not on file  . Food insecurity:    Worry: Not on file    Inability: Not on file  . Transportation needs:    Medical: Not on file    Non-medical: Not on file  Tobacco Use  . Smoking status: Never Smoker  . Smokeless tobacco: Never Used  Substance and Sexual Activity  . Alcohol use: No    Alcohol/week: 0.0 standard drinks  . Drug use: No  . Sexual activity: Not Currently  Lifestyle  . Physical activity:    Days per week: Not on file    Minutes per session: Not on file  . Stress: Not on file  Relationships  . Social connections:    Talks on phone: Not on file    Gets together: Not on file    Attends religious service:  Not on file    Active member of club or organization: Not on file    Attends meetings of clubs or organizations: Not on file    Relationship status: Not on file  . Intimate partner violence:    Fear of current or ex partner: Not on file    Emotionally abused: Not on file    Physically abused: Not on file    Forced sexual activity: Not on file  Other Topics Concern  . Not on file  Social History Narrative   Born and raised in [place], [place]. Currently resides in a house with her son. 1 dog. Fun: go to church   Divorced(Has total of #3 children)-[place], [place], Honor Junes   Denies religious beliefs that would effect health care.    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[employer]:    Allergies  Allergen Reactions  . Iodinated Diagnostic Agents Anaphylaxis and Other (See Comments)    IPD dye Info given by patient  . Ioxaglate Anaphylaxis and Other (See Comments)    Info given by patient  . Red Dye Anaphylaxis  . Lactose Intolerance (Gi)   . Whey Other (See Comments)    Lactose  intolerance  . Darvon [Propoxyphene] Rash  . Hydralazine Anxiety and Other (See Comments)    Facial flushing, pt prefers not to use it.   . Milk-Related Compounds Other (See Comments)    Lactose intolerance Can tolerate milk if its cooked into the recipe, just can't drink milk     Pertinent  Health Maintenance Due  Topic Date Due  . DEXA SCAN  08/26/2004  . PNA vac Low Risk Adult (2 of 2 - PPSV23) 05/02/2018  . INFLUENZA VACCINE  05/23/2018 (Originally 01/31/2018)  . URINE MICROALBUMIN  05/23/2018 (Originally 08/26/1950)    Medications: Outpatient Encounter Medications as of 05/07/2018  Medication Sig  . acetaminophen (TYLENOL) 500 MG tablet Take 500-1,000 mg by mouth daily as needed for mild pain or moderate pain.   [name] aspirin EC 81 MG tablet Take 1 tablet (81 mg total) by mouth daily.  . carvedilol (COREG) 25 MG tablet TAKE (1) TABLET BY MOUTH TWICE DAILY WITH A MEAL.  [name] ezetimibe-simvastatin (VYTORIN) 10-20 MG tablet Take 1 tablet by mouth daily.  . furosemide (LASIX) 40 MG tablet TAKE 1 TABLET BY MOUTH ONCE DAILY.  [name] gabapentin (NEURONTIN) 300 MG capsule TAKE 1 CAPSULE BY MOUTH THREE TIMES A DAY.  . meclizine (ANTIVERT) 25 MG tablet Take 25 mg by mouth as needed for dizziness or nausea.   . ondansetron (ZOFRAN) 4 MG tablet Take 1 tablet (4 mg total) by mouth every 8 (eight) hours as needed for nausea or vomiting.  [name] oxyCODONE (OXY IR/ROXICODONE) 5 MG immediate release tablet Take 1 tablet (5 mg total) by mouth every 6 (six) hours as needed for breakthrough pain.  . pantoprazole (PROTONIX) 40 MG tablet TAKE 1 TABLET BY MOUTH TWICE DAILY.  [name] PARoxetine (PAXIL) 10 MG tablet Take 10 mg by mouth daily.  . Pediatric Multivitamins-Iron (FLINTSTONES PLUS IRON) chewable tablet Chew 2 tablets by mouth daily.  . potassium chloride (K-DUR) 10 MEQ tablet Take 2 tablets (20 mEq total) by mouth daily.  . temazepam (RESTORIL) 15 MG capsule Take 1 capsule (15 mg total) by mouth at bedtime.  .  traMADol (ULTRAM) 50 MG tablet Take 50 mg by mouth 3 (three) times daily.  [name] warfarin (COUMADIN) 2.5 MG tablet  Take 2.5 mg by mouth daily. Take on Mon., Wed., Fri., Sat.  . warfarin (COUMADIN) 5 MG tablet Take 5 mg by mouth daily. Take on Sun.,Tues., Thurs.,  . [DISCONTINUED] ferrous sulfate 325 (65 FE) MG tablet Take 325 mg by mouth every evening.   . [DISCONTINUED] promethazine (PHENERGAN) 12.5 MG tablet Take 1 tablet (12.5 mg total) by mouth every 8 (eight) hours as needed for nausea or vomiting.  . [DISCONTINUED] traMADol (ULTRAM) 50 MG tablet Take 1 tablet (50 mg total) by mouth every 8 (eight) hours. Hold for sedation or respiratory distress (Patient taking differently: Take 50 mg by mouth 3 (three) times daily. Hold for sedation or respiratory distress )  . [DISCONTINUED] warfarin (COUMADIN) 5 MG tablet TAKE 1/2 TABLET BY MOUTH ON WEDNESDAY AND 1 TABLET ALL OTHER DAYS OR AS DIRECTED BY ANTICOAGULATION CLINIC.   No facility-administered encounter medications on file as of 05/07/2018.      Review of Systems  Vitals:   05/07/18 1054  BP: (!) 115/52  Pulse: 94  Resp: (!) 21  Temp: 98.4 F (36.9 C)  TempSrc: Oral  SpO2: 93%   There is no height or weight on file to calculate BMI. Physical Exam  Constitutional: She is oriented to person, place, and time. She appears well-developed and well-nourished.  HENT:  Head: Normocephalic.  Mouth/Throat: Oropharynx is clear and moist.  Eyes: Pupils are equal, round, and reactive to light.  Neck: Neck supple.  Cardiovascular: Normal rate. An irregular rhythm present.  No murmur heard. Pulmonary/Chest: Effort normal and breath sounds normal. No stridor. No respiratory distress. She has no wheezes. She has no rales.  Abdominal: Soft. Bowel sounds are normal. She exhibits no distension. There is no tenderness. There is no guarding.  Musculoskeletal: She exhibits no edema.  Neurological: She is alert and oriented to person, place, and time.    Had No Focal Deficits  Psychiatric: She has a normal mood and affect. Her speech is normal and behavior is normal. She is not slowed.    Labs reviewed: Basic Metabolic Panel: Recent Labs    12/19/17 1820  04/15/18 0639 04/16/18 0437  04/18/18 0448 04/29/18 0400 05/06/18 0700  NA  --    < > 135 138   < > 140 141 140  K  --    < > 2.5* 3.4*   < > 4.6 4.0 3.9  CL  --    < > 90* 99   < > 102 102 101  CO2  --    < > 36* 29   < > 31 30 31   GLUCOSE  --    < > 188* 159*   < > 144* 144* 153*  BUN  --    < > 12 13   < > 15 11 12   CREATININE  --    < > 0.81 0.81   < > 0.85 0.83 0.67  CALCIUM  --    < > 8.7* 8.6*   < > 8.8* 8.9 9.1  MG 2.0  --  2.0 1.9  --   --   --   --   PHOS 4.3  --   --   --   --   --   --   --    < > = values in this interval not displayed.   Liver Function Tests: Recent Labs    02/15/18 1504 02/18/18 0527 04/15/18 0639  AST 17 16 13*  ALT 16 13 10   ALKPHOS  79 67 57  BILITOT 0.9 0.7 1.6*  PROT 6.5 6.0* 6.7  ALBUMIN 3.6 3.4* 3.2*   Recent Labs    02/15/18 1504  LIPASE 22   No results for input(s): AMMONIA in the last 8760 hours. CBC: Recent Labs    04/15/18 0639  04/19/18 0446 04/29/18 0400 05/06/18 0700  WBC 7.5   < > 6.9 4.4 5.4  NEUTROABS 4.8  --   --  2.3 3.3  HGB 13.2   < > 12.3 11.6* 12.2  HCT 38.4   < > 36.3 35.5* 37.8  MCV 84.4   < > 83.1 87.7 85.9  PLT 172   < > 202 227 224   < > = values in this interval not displayed.   Cardiac Enzymes: Recent Labs    02/16/18 1023 02/16/18 1556 04/15/18 0639  CKTOTAL 35*  --  22*  TROPONINI <0.03 <0.03 <0.03   BNP: Invalid input(s): POCBNP CBG: Recent Labs    12/20/17 2137 12/21/17 0755 12/21/17 1149  GLUCAP 203* 125* 149*    Procedures and Imaging Studies During Stay: Ct Head Wo Contrast  Result Date: 04/15/2018 CLINICAL DATA:  Patient passed out 5 days ago and fell. Posterior neck pain. EXAM: CT HEAD WITHOUT CONTRAST CT CERVICAL SPINE WITHOUT CONTRAST TECHNIQUE: Multidetector  CT imaging of the head and cervical spine was performed following the standard protocol without intravenous contrast. Multiplanar CT image reconstructions of the cervical spine were also generated. COMPARISON:  MRI brain 12/20/2017. CT head 12/19/2017. CT head and cervical spine 10/02/2006 FINDINGS: CT HEAD FINDINGS Brain: Mild cerebral atrophy. Patchy low-attenuation changes in the deep white matter consistent with small vessel ischemic changes and old lacunar infarcts. Old area of encephalomalacia consistent with old infarct in the right superior cerebellum. No mass-effect or midline shift. No abnormal extra-axial fluid collections. Gray-white matter junctions are distinct. Basal cisterns are not effaced. No acute intracranial hemorrhage. Vascular: Moderate intracranial arterial vascular calcifications are present. Skull: Calvarium appears intact. No acute depressed skull fractures. Sinuses/Orbits: Paranasal sinuses and mastoid air cells are clear. Other: None. CT CERVICAL SPINE FINDINGS Alignment: Normal alignment of the cervical spine and facet joints. C1-2 articulation appears intact. Skull base and vertebrae: Skull base appears intact. No vertebral compression deformities. No focal bone lesion or bone destruction. Soft tissues and spinal canal: No prevertebral soft tissue swelling. No abnormal paraspinal soft tissue mass or infiltration. Disc levels: Postoperative changes with anterior screw fixation and intervertebral fusion at C3-4 and from C5-C7. Fused segments appear intact. Hardware appears intact. Degenerative changes with disc space narrowing and endplate hypertrophic changes at C7-T1 and T1-2. Degenerative changes at C1-2. Degenerative changes in the facet joints. Upper chest: Lung apices are clear. Other: None. IMPRESSION: 1. No acute intracranial abnormalities. Chronic atrophy and small vessel ischemic changes. 2. Normal alignment of the cervical spine. Postoperative changes with anterior screw  fixation and intervertebral fusion at C3-4 and C5-C7. No acute displaced fractures identified. Electronically Signed   By: Lucienne Capers M.D.   On: 04/15/2018 06:36   Ct Cervical Spine Wo Contrast  Result Date: 04/15/2018 CLINICAL DATA:  Patient passed out 5 days ago and fell. Posterior neck pain. EXAM: CT HEAD WITHOUT CONTRAST CT CERVICAL SPINE WITHOUT CONTRAST TECHNIQUE: Multidetector CT imaging of the head and cervical spine was performed following the standard protocol without intravenous contrast. Multiplanar CT image reconstructions of the cervical spine were also generated. COMPARISON:  MRI brain 12/20/2017. CT head 12/19/2017. CT head and cervical spine 10/02/2006 FINDINGS: CT  HEAD FINDINGS Brain: Mild cerebral atrophy. Patchy low-attenuation changes in the deep white matter consistent with small vessel ischemic changes and old lacunar infarcts. Old area of encephalomalacia consistent with old infarct in the right superior cerebellum. No mass-effect or midline shift. No abnormal extra-axial fluid collections. Gray-white matter junctions are distinct. Basal cisterns are not effaced. No acute intracranial hemorrhage. Vascular: Moderate intracranial arterial vascular calcifications are present. Skull: Calvarium appears intact. No acute depressed skull fractures. Sinuses/Orbits: Paranasal sinuses and mastoid air cells are clear. Other: None. CT CERVICAL SPINE FINDINGS Alignment: Normal alignment of the cervical spine and facet joints. C1-2 articulation appears intact. Skull base and vertebrae: Skull base appears intact. No vertebral compression deformities. No focal bone lesion or bone destruction. Soft tissues and spinal canal: No prevertebral soft tissue swelling. No abnormal paraspinal soft tissue mass or infiltration. Disc levels: Postoperative changes with anterior screw fixation and intervertebral fusion at C3-4 and from C5-C7. Fused segments appear intact. Hardware appears intact. Degenerative  changes with disc space narrowing and endplate hypertrophic changes at C7-T1 and T1-2. Degenerative changes at C1-2. Degenerative changes in the facet joints. Upper chest: Lung apices are clear. Other: None. IMPRESSION: 1. No acute intracranial abnormalities. Chronic atrophy and small vessel ischemic changes. 2. Normal alignment of the cervical spine. Postoperative changes with anterior screw fixation and intervertebral fusion at C3-4 and C5-C7. No acute displaced fractures identified. Electronically Signed   By: Lucienne Capers M.D.   On: 04/15/2018 06:36   Ct Knee Left Wo Contrast  Result Date: 04/15/2018 CLINICAL DATA:  Left knee pain and swelling since a fall and twisting injury 5 days ago. Initial encounter. EXAM: CT OF THE LEFT KNEE WITHOUT CONTRAST TECHNIQUE: Multidetector CT imaging of the left knee was performed according to the standard protocol. Multiplanar CT image reconstructions were also generated. COMPARISON:  Plain films left knee earlier today. FINDINGS: Bones/Joint/Cartilage No fracture is identified. No focal bony lesion is seen. There is mild medial compartment joint space narrowing. Mild osteophytosis is seen about the knee. Ligaments Suboptimally assessed by CT. The cruciate and collateral ligaments appear intact. Chondrocalcinosis of the menisci and anterior cruciate ligament is noted. The root of the posterior horn of the medial meniscus is difficult to visualize and may be torn. Muscles and Tendons Intact. Soft tissues Moderate to moderately large joint effusion is seen. IMPRESSION: Negative for fracture or other acute bony abnormality. Possible tear of the root of the posterior horn of the medial meniscus. This could be better evaluated with nonemergent MRI. Chondrocalcinosis. Moderate to moderately large joint effusion. Electronically Signed   By: Inge Rise M.D.   On: 04/15/2018 10:44   Mr Knee Left Wo Contrast  Result Date: 04/16/2018 CLINICAL DATA:  Left knee pain  after fall on 04/10/2018 EXAM: MRI OF THE LEFT KNEE WITHOUT CONTRAST TECHNIQUE: Multiplanar, multisequence MR imaging of the knee was performed. No intravenous contrast was administered. COMPARISON:  None. FINDINGS: MENISCI Medial meniscus: Near full-thickness radial tear of the posterior mesial aspect of the medial meniscus, series 6/images 7 and 8. Lateral meniscus:  Intact and of normal morphology. LIGAMENTS Cruciates: Slight posterior bowing of the ACL which is slightly thickened in appearance. A mild ACL sprain may account for this appearance. The PCL appears intact. Collaterals:  Intact MCL and LCL. CARTILAGE Patellofemoral: Diffuse chondral thinning of the cartilage overlying the medial patellar facet. Mild thinning of the trochlear cartilage. No focal defect. Medial: Chondromalacia of the medial femorotibial cartilage series 8/12. Lateral:  Intact focal defect Joint:  Moderate to large suprapatellar joint effusion with slightly thickened medial plica. Trace edema of Hoffa's fat pad. Trace fluid in the deep infrapatellar bursa. Popliteal Fossa:  Small popliteal cyst.  Intact popliteus. Extensor Mechanism:  Intact extensor mechanism tendons and MPFL. Bones: No marrow signal abnormality indicative of fracture. No destruction. Other: None IMPRESSION: 1. Near complete full thickness radial tear of the posterior mesial corner of the medial meniscus near the meniscal root. 2. Slight dorsal bowing of the anterior cruciate ligament is slightly thickened in appearance. Mild ACL sprain may account appearance. The PCL is intact. The ligaments are. 3. Medial femorotibial joint space narrowing secondary to diffuse malacia. Mild thinning is also noted of the cartilage overlying medial patellar facet trochlea. 4. Moderate large suprapatellar joint effusion slightly thickened medial plica. 5. Deep infrapatellar bursitis. 6. Small popliteal cyst. Electronically Signed   By: Ashley Royalty M.D.   On: 04/16/2018 20:28   Dg Knee  Complete 4 Views Left  Result Date: 04/15/2018 CLINICAL DATA:  Left knee pain for 5 days. EXAM: LEFT KNEE - COMPLETE 4+ VIEW COMPARISON:  Left knee radiograph 11/02/2016 FINDINGS: Mild degenerative change of the left knee. No fracture or dislocation. No joint effusion. There are vascular calcifications. IMPRESSION: Mild left knee osteoarthrosis without acute abnormality. Electronically Signed   By: Ulyses Jarred M.D.   On: 04/15/2018 06:44    Assessment/Plan:   Syncope and Fall Her work up was negative No More Episodes in Facility  PAF (paroxysmal atrial fibrillation) on Coumadin Continue home dose of Coumadin Follow up INR with her PCP Follows with Cardiology  Chronic diastolic congestive heart failure  On Home dose of Lasix and Potassium Renal Function was stable CAD On Coreg,Aspirin and Statin  Acute medial meniscus tear, left,  Pain Controlled on Oxycodone.20 Tabs Given Also on Ultram PRN Will go Home with Therapy and Follow up with Ortho  Depression Doing well with Paxil Hyperlipidemia On Vytorin ? Neuropathy Patient on high Dose of Neurontin. Due to Her dizziness will Decrease the dose to 200 mg TID Follow up with PCP  Future labs/tests needed:    Discharge Visit More then 30 Min

## 2018-05-08 ENCOUNTER — Other Ambulatory Visit: Payer: Self-pay | Admitting: *Deleted

## 2018-05-08 DIAGNOSIS — C160 Malignant neoplasm of cardia: Secondary | ICD-10-CM

## 2018-05-08 DIAGNOSIS — D509 Iron deficiency anemia, unspecified: Secondary | ICD-10-CM

## 2018-05-09 ENCOUNTER — Inpatient Hospital Stay: Payer: Medicare Other | Attending: Oncology | Admitting: Oncology

## 2018-05-09 ENCOUNTER — Inpatient Hospital Stay: Payer: Medicare Other

## 2018-05-09 DIAGNOSIS — S83242D Other tear of medial meniscus, current injury, left knee, subsequent encounter: Secondary | ICD-10-CM | POA: Diagnosis not present

## 2018-05-09 DIAGNOSIS — M1990 Unspecified osteoarthritis, unspecified site: Secondary | ICD-10-CM | POA: Diagnosis not present

## 2018-05-09 DIAGNOSIS — Z86711 Personal history of pulmonary embolism: Secondary | ICD-10-CM | POA: Diagnosis not present

## 2018-05-09 DIAGNOSIS — I6523 Occlusion and stenosis of bilateral carotid arteries: Secondary | ICD-10-CM | POA: Diagnosis not present

## 2018-05-09 DIAGNOSIS — Z7901 Long term (current) use of anticoagulants: Secondary | ICD-10-CM | POA: Diagnosis not present

## 2018-05-09 DIAGNOSIS — I350 Nonrheumatic aortic (valve) stenosis: Secondary | ICD-10-CM | POA: Diagnosis not present

## 2018-05-09 DIAGNOSIS — Z8501 Personal history of malignant neoplasm of esophagus: Secondary | ICD-10-CM | POA: Diagnosis not present

## 2018-05-09 DIAGNOSIS — Z5181 Encounter for therapeutic drug level monitoring: Secondary | ICD-10-CM | POA: Diagnosis not present

## 2018-05-09 DIAGNOSIS — I110 Hypertensive heart disease with heart failure: Secondary | ICD-10-CM | POA: Diagnosis not present

## 2018-05-09 DIAGNOSIS — Z7982 Long term (current) use of aspirin: Secondary | ICD-10-CM | POA: Diagnosis not present

## 2018-05-09 DIAGNOSIS — I5032 Chronic diastolic (congestive) heart failure: Secondary | ICD-10-CM | POA: Diagnosis not present

## 2018-05-09 DIAGNOSIS — I480 Paroxysmal atrial fibrillation: Secondary | ICD-10-CM | POA: Diagnosis not present

## 2018-05-09 DIAGNOSIS — Z79891 Long term (current) use of opiate analgesic: Secondary | ICD-10-CM | POA: Diagnosis not present

## 2018-05-09 DIAGNOSIS — Z9181 History of falling: Secondary | ICD-10-CM | POA: Diagnosis not present

## 2018-05-09 DIAGNOSIS — I2510 Atherosclerotic heart disease of native coronary artery without angina pectoris: Secondary | ICD-10-CM | POA: Diagnosis not present

## 2018-05-10 ENCOUNTER — Telehealth: Payer: Self-pay | Admitting: Adult Health

## 2018-05-10 NOTE — Telephone Encounter (Signed)
Carol aware of recommendations per [facility].   Cory please advise on if you are okay to sign off on PT/OT/SN/Social Work: permission to treat orders

## 2018-05-10 NOTE — Telephone Encounter (Signed)
I am ok with signing off on PT/OT/SN/Social work orders

## 2018-05-10 NOTE — Telephone Encounter (Signed)
If her BP is consistently that low, go ahead and take 1/2 ( 12.5 mg) Coreg BID

## 2018-05-10 NOTE — Telephone Encounter (Signed)
Please advise [name], thanks.

## 2018-05-10 NOTE — Telephone Encounter (Signed)
Copied from [place] [id number]. Topic: Quick Communication - Home Health Verbal Orders >> May 10, 2018  9:37 AM Berneta Levins wrote: Caller/Agency: Mickel Crow with [facility] Number: [phone] Requesting OT/PT/Skilled Nursing/Social Work: permission to treat, wants to know if blood pressure medication needs to be changed.  BP 100/52 sitting HR 62

## 2018-05-10 NOTE — Telephone Encounter (Signed)
[name] aware that orders have been okayed per [employer]  Nothing further needed.

## 2018-05-11 DIAGNOSIS — I110 Hypertensive heart disease with heart failure: Secondary | ICD-10-CM | POA: Diagnosis not present

## 2018-05-11 DIAGNOSIS — I2510 Atherosclerotic heart disease of native coronary artery without angina pectoris: Secondary | ICD-10-CM | POA: Diagnosis not present

## 2018-05-11 DIAGNOSIS — S83242D Other tear of medial meniscus, current injury, left knee, subsequent encounter: Secondary | ICD-10-CM | POA: Diagnosis not present

## 2018-05-11 DIAGNOSIS — I5032 Chronic diastolic (congestive) heart failure: Secondary | ICD-10-CM | POA: Diagnosis not present

## 2018-05-11 DIAGNOSIS — I480 Paroxysmal atrial fibrillation: Secondary | ICD-10-CM | POA: Diagnosis not present

## 2018-05-11 DIAGNOSIS — I6523 Occlusion and stenosis of bilateral carotid arteries: Secondary | ICD-10-CM | POA: Diagnosis not present

## 2018-05-13 ENCOUNTER — Telehealth: Payer: Self-pay | Admitting: Adult Health

## 2018-05-13 ENCOUNTER — Telehealth: Payer: Self-pay | Admitting: Orthopedic Surgery

## 2018-05-13 DIAGNOSIS — I110 Hypertensive heart disease with heart failure: Secondary | ICD-10-CM | POA: Diagnosis not present

## 2018-05-13 DIAGNOSIS — S83242D Other tear of medial meniscus, current injury, left knee, subsequent encounter: Secondary | ICD-10-CM | POA: Diagnosis not present

## 2018-05-13 DIAGNOSIS — I5032 Chronic diastolic (congestive) heart failure: Secondary | ICD-10-CM | POA: Diagnosis not present

## 2018-05-13 DIAGNOSIS — I480 Paroxysmal atrial fibrillation: Secondary | ICD-10-CM | POA: Diagnosis not present

## 2018-05-13 DIAGNOSIS — I6523 Occlusion and stenosis of bilateral carotid arteries: Secondary | ICD-10-CM | POA: Diagnosis not present

## 2018-05-13 DIAGNOSIS — I2510 Atherosclerotic heart disease of native coronary artery without angina pectoris: Secondary | ICD-10-CM | POA: Diagnosis not present

## 2018-05-13 NOTE — Telephone Encounter (Signed)
Copied from [place] [id number]. Topic: Quick Communication - See Telephone Encounter >> May 13, 2018  9:39 AM Percell Belt A wrote: CRM for notification. See Telephone encounter for: 05/13/18.  L[facility]e Beach with [facility] is [facility] pt PT INR = 1.7 Cb number  [phone]

## 2018-05-13 NOTE — Telephone Encounter (Signed)
Copied from [place] [id number]. Topic: General - Other >> May 13, 2018 12:37 PM Yvette Rack wrote: Reason for CRM: RN Gwynne Edinger [facility]  [phone] ext [phone] calling for verbal orders for home health aid for bathing 2 times a week for 3 weeks

## 2018-05-13 NOTE — Telephone Encounter (Signed)
Call received via voice message from [place] at [facility], ph# [phone], ext [phone], with question about brace prescribed - states patient does not know what to do about brace.  Please advise.

## 2018-05-14 ENCOUNTER — Telehealth: Payer: Self-pay | Admitting: *Deleted

## 2018-05-14 ENCOUNTER — Inpatient Hospital Stay: Payer: Medicare Other | Admitting: Adult Health

## 2018-05-14 ENCOUNTER — Ambulatory Visit (INDEPENDENT_AMBULATORY_CARE_PROVIDER_SITE_OTHER): Payer: Medicare Other | Admitting: General Practice

## 2018-05-14 ENCOUNTER — Telehealth: Payer: Self-pay | Admitting: Adult Health

## 2018-05-14 DIAGNOSIS — Z7901 Long term (current) use of anticoagulants: Secondary | ICD-10-CM | POA: Diagnosis not present

## 2018-05-14 DIAGNOSIS — I4891 Unspecified atrial fibrillation: Secondary | ICD-10-CM

## 2018-05-14 LAB — POCT INR: INR: 1.7 — AB (ref 2.0–3.0)

## 2018-05-14 NOTE — Telephone Encounter (Signed)
Called to apologize for missing her appointment. She had surgery and her sister recently died. Wants to reschedule. Message sent to scheduler.

## 2018-05-14 NOTE — Telephone Encounter (Signed)
Left a message for a return call.

## 2018-05-14 NOTE — Telephone Encounter (Signed)
Copied from [place] [id number]. Topic: Quick Communication - Home Health Verbal Orders >> May 14, 2018  2:24 PM Alfredia Ferguson R wrote: Caller/Agency: Erline Levine ([place]) [place] Number: [phone] Requesting OT/PT/Skilled Nursing/Social Work: PT Frequency: 1 WEEK 1, 2 WEEK 3

## 2018-05-14 NOTE — Patient Instructions (Signed)
Pre visit review using our clinic review tool, if applicable. No additional management support is needed unless otherwise documented below in the visit note.  Take extra 1/2 tablet today and then continue to take 1 tablet daily except for 1/2  tablet on Monday/Wed/Fridays/Saturdays.  Re-check in 2 weeks. Dosing instructions given to Larene Beach, RN @ [facility] while in patient's home. [phone].

## 2018-05-14 NOTE — Telephone Encounter (Signed)
She was to take brace order to [employer], ours were too large. Called her to advise recording states don't leave messages.  I called patient to advise her AGAIN how to get the brace. Left message for her.

## 2018-05-14 NOTE — Progress Notes (Signed)
I have reviewed and agree with this plan  

## 2018-05-15 DIAGNOSIS — I2510 Atherosclerotic heart disease of native coronary artery without angina pectoris: Secondary | ICD-10-CM | POA: Diagnosis not present

## 2018-05-15 DIAGNOSIS — I5032 Chronic diastolic (congestive) heart failure: Secondary | ICD-10-CM | POA: Diagnosis not present

## 2018-05-15 DIAGNOSIS — S83242D Other tear of medial meniscus, current injury, left knee, subsequent encounter: Secondary | ICD-10-CM | POA: Diagnosis not present

## 2018-05-15 DIAGNOSIS — I6523 Occlusion and stenosis of bilateral carotid arteries: Secondary | ICD-10-CM | POA: Diagnosis not present

## 2018-05-15 DIAGNOSIS — I110 Hypertensive heart disease with heart failure: Secondary | ICD-10-CM | POA: Diagnosis not present

## 2018-05-15 DIAGNOSIS — I480 Paroxysmal atrial fibrillation: Secondary | ICD-10-CM | POA: Diagnosis not present

## 2018-05-15 NOTE — Telephone Encounter (Signed)
Ok for verbal orders ?

## 2018-05-16 DIAGNOSIS — I480 Paroxysmal atrial fibrillation: Secondary | ICD-10-CM | POA: Diagnosis not present

## 2018-05-16 DIAGNOSIS — I6523 Occlusion and stenosis of bilateral carotid arteries: Secondary | ICD-10-CM | POA: Diagnosis not present

## 2018-05-16 DIAGNOSIS — I5032 Chronic diastolic (congestive) heart failure: Secondary | ICD-10-CM | POA: Diagnosis not present

## 2018-05-16 DIAGNOSIS — S83242D Other tear of medial meniscus, current injury, left knee, subsequent encounter: Secondary | ICD-10-CM | POA: Diagnosis not present

## 2018-05-16 DIAGNOSIS — I2510 Atherosclerotic heart disease of native coronary artery without angina pectoris: Secondary | ICD-10-CM | POA: Diagnosis not present

## 2018-05-16 DIAGNOSIS — I110 Hypertensive heart disease with heart failure: Secondary | ICD-10-CM | POA: Diagnosis not present

## 2018-05-16 NOTE — Telephone Encounter (Signed)
Spoke to [place] and informed her to proceed with all below mentioned orders.  No further action required.

## 2018-05-16 NOTE — Telephone Encounter (Signed)
Spoke to [place] and informed her to proceed with order for bathing.  No further action required.

## 2018-05-17 ENCOUNTER — Telehealth: Payer: Self-pay | Admitting: Oncology

## 2018-05-17 DIAGNOSIS — S83242D Other tear of medial meniscus, current injury, left knee, subsequent encounter: Secondary | ICD-10-CM | POA: Diagnosis not present

## 2018-05-17 DIAGNOSIS — I110 Hypertensive heart disease with heart failure: Secondary | ICD-10-CM | POA: Diagnosis not present

## 2018-05-17 DIAGNOSIS — I6523 Occlusion and stenosis of bilateral carotid arteries: Secondary | ICD-10-CM | POA: Diagnosis not present

## 2018-05-17 DIAGNOSIS — I480 Paroxysmal atrial fibrillation: Secondary | ICD-10-CM | POA: Diagnosis not present

## 2018-05-17 DIAGNOSIS — I5032 Chronic diastolic (congestive) heart failure: Secondary | ICD-10-CM | POA: Diagnosis not present

## 2018-05-17 DIAGNOSIS — I2510 Atherosclerotic heart disease of native coronary artery without angina pectoris: Secondary | ICD-10-CM | POA: Diagnosis not present

## 2018-05-17 NOTE — Telephone Encounter (Signed)
Called about 12/12

## 2018-05-20 ENCOUNTER — Ambulatory Visit: Payer: Medicare Other | Admitting: Adult Health

## 2018-05-20 ENCOUNTER — Telehealth: Payer: Self-pay | Admitting: Adult Health

## 2018-05-20 DIAGNOSIS — Z760 Encounter for issue of repeat prescription: Secondary | ICD-10-CM

## 2018-05-20 MED ORDER — WARFARIN SODIUM 5 MG PO TABS: 5.0000 mg | ORAL_TABLET | Freq: Every day | ORAL | 0 refills | Status: DC

## 2018-05-20 MED ORDER — TEMAZEPAM 15 MG PO CAPS: 15.0000 mg | ORAL_CAPSULE | Freq: Every day | ORAL | 1 refills | Status: DC

## 2018-05-20 NOTE — Telephone Encounter (Signed)
Pt is in the office today and need Warfarin Sodium 5 MG (out of this Rx)  and Temazepam 15 MG (and only have 4 of these)  Pharm:  [facility]

## 2018-05-21 DIAGNOSIS — I6523 Occlusion and stenosis of bilateral carotid arteries: Secondary | ICD-10-CM | POA: Diagnosis not present

## 2018-05-21 DIAGNOSIS — I2510 Atherosclerotic heart disease of native coronary artery without angina pectoris: Secondary | ICD-10-CM | POA: Diagnosis not present

## 2018-05-21 DIAGNOSIS — I5032 Chronic diastolic (congestive) heart failure: Secondary | ICD-10-CM | POA: Diagnosis not present

## 2018-05-21 DIAGNOSIS — S83242D Other tear of medial meniscus, current injury, left knee, subsequent encounter: Secondary | ICD-10-CM | POA: Diagnosis not present

## 2018-05-21 DIAGNOSIS — I110 Hypertensive heart disease with heart failure: Secondary | ICD-10-CM | POA: Diagnosis not present

## 2018-05-21 DIAGNOSIS — I480 Paroxysmal atrial fibrillation: Secondary | ICD-10-CM | POA: Diagnosis not present

## 2018-05-22 DIAGNOSIS — S83242D Other tear of medial meniscus, current injury, left knee, subsequent encounter: Secondary | ICD-10-CM | POA: Diagnosis not present

## 2018-05-22 DIAGNOSIS — I5032 Chronic diastolic (congestive) heart failure: Secondary | ICD-10-CM | POA: Diagnosis not present

## 2018-05-22 DIAGNOSIS — I6523 Occlusion and stenosis of bilateral carotid arteries: Secondary | ICD-10-CM | POA: Diagnosis not present

## 2018-05-22 DIAGNOSIS — I110 Hypertensive heart disease with heart failure: Secondary | ICD-10-CM | POA: Diagnosis not present

## 2018-05-22 DIAGNOSIS — I2510 Atherosclerotic heart disease of native coronary artery without angina pectoris: Secondary | ICD-10-CM | POA: Diagnosis not present

## 2018-05-22 DIAGNOSIS — I480 Paroxysmal atrial fibrillation: Secondary | ICD-10-CM | POA: Diagnosis not present

## 2018-05-23 DIAGNOSIS — S83242D Other tear of medial meniscus, current injury, left knee, subsequent encounter: Secondary | ICD-10-CM | POA: Diagnosis not present

## 2018-05-23 DIAGNOSIS — I110 Hypertensive heart disease with heart failure: Secondary | ICD-10-CM | POA: Diagnosis not present

## 2018-05-23 DIAGNOSIS — I2510 Atherosclerotic heart disease of native coronary artery without angina pectoris: Secondary | ICD-10-CM | POA: Diagnosis not present

## 2018-05-23 DIAGNOSIS — I6523 Occlusion and stenosis of bilateral carotid arteries: Secondary | ICD-10-CM | POA: Diagnosis not present

## 2018-05-23 DIAGNOSIS — I480 Paroxysmal atrial fibrillation: Secondary | ICD-10-CM | POA: Diagnosis not present

## 2018-05-23 DIAGNOSIS — I5032 Chronic diastolic (congestive) heart failure: Secondary | ICD-10-CM | POA: Diagnosis not present

## 2018-05-24 DIAGNOSIS — I480 Paroxysmal atrial fibrillation: Secondary | ICD-10-CM | POA: Diagnosis not present

## 2018-05-24 DIAGNOSIS — I6523 Occlusion and stenosis of bilateral carotid arteries: Secondary | ICD-10-CM | POA: Diagnosis not present

## 2018-05-24 DIAGNOSIS — S83242D Other tear of medial meniscus, current injury, left knee, subsequent encounter: Secondary | ICD-10-CM | POA: Diagnosis not present

## 2018-05-24 DIAGNOSIS — I2510 Atherosclerotic heart disease of native coronary artery without angina pectoris: Secondary | ICD-10-CM | POA: Diagnosis not present

## 2018-05-24 DIAGNOSIS — I110 Hypertensive heart disease with heart failure: Secondary | ICD-10-CM | POA: Diagnosis not present

## 2018-05-24 DIAGNOSIS — I5032 Chronic diastolic (congestive) heart failure: Secondary | ICD-10-CM | POA: Diagnosis not present

## 2018-05-27 ENCOUNTER — Ambulatory Visit (INDEPENDENT_AMBULATORY_CARE_PROVIDER_SITE_OTHER): Payer: Medicare Other | Admitting: General Practice

## 2018-05-27 ENCOUNTER — Ambulatory Visit (INDEPENDENT_AMBULATORY_CARE_PROVIDER_SITE_OTHER): Payer: Medicare Other | Admitting: Orthopedic Surgery

## 2018-05-27 VITALS — BP 99/57 | HR 57 | Ht 62.0 in | Wt 111.0 lb

## 2018-05-27 DIAGNOSIS — S83242D Other tear of medial meniscus, current injury, left knee, subsequent encounter: Secondary | ICD-10-CM | POA: Diagnosis not present

## 2018-05-27 DIAGNOSIS — I480 Paroxysmal atrial fibrillation: Secondary | ICD-10-CM | POA: Diagnosis not present

## 2018-05-27 DIAGNOSIS — I5032 Chronic diastolic (congestive) heart failure: Secondary | ICD-10-CM | POA: Diagnosis not present

## 2018-05-27 DIAGNOSIS — I4891 Unspecified atrial fibrillation: Secondary | ICD-10-CM | POA: Diagnosis not present

## 2018-05-27 DIAGNOSIS — M1710 Unilateral primary osteoarthritis, unspecified knee: Secondary | ICD-10-CM

## 2018-05-27 DIAGNOSIS — M238X2 Other internal derangements of left knee: Secondary | ICD-10-CM

## 2018-05-27 DIAGNOSIS — I110 Hypertensive heart disease with heart failure: Secondary | ICD-10-CM | POA: Diagnosis not present

## 2018-05-27 DIAGNOSIS — Z7901 Long term (current) use of anticoagulants: Secondary | ICD-10-CM | POA: Diagnosis not present

## 2018-05-27 DIAGNOSIS — I6523 Occlusion and stenosis of bilateral carotid arteries: Secondary | ICD-10-CM | POA: Diagnosis not present

## 2018-05-27 DIAGNOSIS — Z9889 Other specified postprocedural states: Secondary | ICD-10-CM

## 2018-05-27 DIAGNOSIS — I2510 Atherosclerotic heart disease of native coronary artery without angina pectoris: Secondary | ICD-10-CM | POA: Diagnosis not present

## 2018-05-27 LAB — POCT INR: INR: 2.2 (ref 2.0–3.0)

## 2018-05-27 MED ORDER — GABAPENTIN 400 MG PO CAPS: 400.0000 mg | ORAL_CAPSULE | Freq: Three times a day (TID) | ORAL | 0 refills | Status: DC

## 2018-05-27 NOTE — Patient Instructions (Signed)
Pre visit review using our clinic review tool, if applicable. No additional management support is needed unless otherwise documented below in the visit note.  Continue to take 1 tablet daily except for 1/2  tablet on Monday/Wed/Fridays/Saturdays.  Re-check in 2 weeks. Dosing instructions given to [place], RN @ [facility] while in patient's home. [phone].

## 2018-05-27 NOTE — Patient Instructions (Signed)
Keep appt as scheduled

## 2018-05-27 NOTE — Progress Notes (Signed)
I have reviewed and agree with this plan  

## 2018-05-27 NOTE — Progress Notes (Signed)
Chief Complaint  Patient presents with  . Follow-up    Recheck on left knee, DOS 04-18-18.   postop day #39  Presents today complaining of swelling increased pain warmth without erythema left surgical knee  She says she is having trouble weightbearing and her pain is requiring oxycodone.  She has a history of lumbar disc disease and chronic back pain  Her pain is actually in her left calf posterior part of her knee and posterior part of her thigh and she has her chronic back pain which is persistent  Prior history from the original consult noted below  Other than the loss of motion I do not see any swelling do not feel any warmth there is no erythema  She does have tenderness diffusely around the knee and peripatellar region  No calf swelling  Encounter Diagnoses  Name Primary?  . S/P left knee arthroscopy and ACL debridement 04/18/18 Yes  . Primary localized osteoarthritis of knee   . Laxity of left anterior cruciate ligament     I do not see any particular problem wrong with her knee  Recommend she increase her gabapentin 400 mg 3 times a day keep her follow-up appointment as arranged  Continue her oxycodone, appears to be more radicular or neurogenic pain to me 77 year old female fell injured her left knee during her work-up for her syncope she was found to have a torn medial meniscus old ACL tear if not acute ACL tear and underwent arthroscopic surgery after several days waiting for her warfarin to be corrected   We found a partial tear anterior cruciate ligament anteromedial bundle posterior horn medial meniscal tear grade 4 arthritis trochlea medial femoral condyle   She comes in today with a flexion contracture and decreased flexion of the knee complaining of severe pain at night   Her wound looks good she had no swelling or effusion she had painful range of motion throughout the range of motion from 20 to 70 degrees

## 2018-05-28 ENCOUNTER — Telehealth: Payer: Self-pay | Admitting: Adult Health

## 2018-05-28 DIAGNOSIS — S83242D Other tear of medial meniscus, current injury, left knee, subsequent encounter: Secondary | ICD-10-CM | POA: Diagnosis not present

## 2018-05-28 DIAGNOSIS — I5032 Chronic diastolic (congestive) heart failure: Secondary | ICD-10-CM | POA: Diagnosis not present

## 2018-05-28 DIAGNOSIS — I110 Hypertensive heart disease with heart failure: Secondary | ICD-10-CM | POA: Diagnosis not present

## 2018-05-28 DIAGNOSIS — I6523 Occlusion and stenosis of bilateral carotid arteries: Secondary | ICD-10-CM | POA: Diagnosis not present

## 2018-05-28 DIAGNOSIS — I480 Paroxysmal atrial fibrillation: Secondary | ICD-10-CM | POA: Diagnosis not present

## 2018-05-28 DIAGNOSIS — I2510 Atherosclerotic heart disease of native coronary artery without angina pectoris: Secondary | ICD-10-CM | POA: Diagnosis not present

## 2018-05-28 NOTE — Telephone Encounter (Signed)
Kat notified to proceed with orders.  No further action required.

## 2018-05-28 NOTE — Telephone Encounter (Signed)
Copied from [place]. Topic: Quick Communication - Home Health Verbal Orders >> May 28, 2018 11:49 AM Valla Leaver wrote: Caller/AgencyHedwig Morton Number: [phone] Requesting OT/PT/Skilled Nursing/Social Work: OT Frequency: 1wk 1 and 2wk 3

## 2018-05-29 DIAGNOSIS — I110 Hypertensive heart disease with heart failure: Secondary | ICD-10-CM | POA: Diagnosis not present

## 2018-05-29 DIAGNOSIS — I6523 Occlusion and stenosis of bilateral carotid arteries: Secondary | ICD-10-CM | POA: Diagnosis not present

## 2018-05-29 DIAGNOSIS — I480 Paroxysmal atrial fibrillation: Secondary | ICD-10-CM | POA: Diagnosis not present

## 2018-05-29 DIAGNOSIS — I2510 Atherosclerotic heart disease of native coronary artery without angina pectoris: Secondary | ICD-10-CM | POA: Diagnosis not present

## 2018-05-29 DIAGNOSIS — I5032 Chronic diastolic (congestive) heart failure: Secondary | ICD-10-CM | POA: Diagnosis not present

## 2018-05-29 DIAGNOSIS — S83242D Other tear of medial meniscus, current injury, left knee, subsequent encounter: Secondary | ICD-10-CM | POA: Diagnosis not present

## 2018-05-31 DIAGNOSIS — I2510 Atherosclerotic heart disease of native coronary artery without angina pectoris: Secondary | ICD-10-CM | POA: Diagnosis not present

## 2018-05-31 DIAGNOSIS — I6523 Occlusion and stenosis of bilateral carotid arteries: Secondary | ICD-10-CM | POA: Diagnosis not present

## 2018-05-31 DIAGNOSIS — I110 Hypertensive heart disease with heart failure: Secondary | ICD-10-CM | POA: Diagnosis not present

## 2018-05-31 DIAGNOSIS — I480 Paroxysmal atrial fibrillation: Secondary | ICD-10-CM | POA: Diagnosis not present

## 2018-05-31 DIAGNOSIS — S83242D Other tear of medial meniscus, current injury, left knee, subsequent encounter: Secondary | ICD-10-CM | POA: Diagnosis not present

## 2018-05-31 DIAGNOSIS — I5032 Chronic diastolic (congestive) heart failure: Secondary | ICD-10-CM | POA: Diagnosis not present

## 2018-06-03 ENCOUNTER — Telehealth: Payer: Self-pay | Admitting: Orthopedic Surgery

## 2018-06-03 DIAGNOSIS — I480 Paroxysmal atrial fibrillation: Secondary | ICD-10-CM | POA: Diagnosis not present

## 2018-06-03 DIAGNOSIS — I2510 Atherosclerotic heart disease of native coronary artery without angina pectoris: Secondary | ICD-10-CM | POA: Diagnosis not present

## 2018-06-03 DIAGNOSIS — I5032 Chronic diastolic (congestive) heart failure: Secondary | ICD-10-CM | POA: Diagnosis not present

## 2018-06-03 DIAGNOSIS — S83242D Other tear of medial meniscus, current injury, left knee, subsequent encounter: Secondary | ICD-10-CM | POA: Diagnosis not present

## 2018-06-03 DIAGNOSIS — I110 Hypertensive heart disease with heart failure: Secondary | ICD-10-CM | POA: Diagnosis not present

## 2018-06-03 DIAGNOSIS — I6523 Occlusion and stenosis of bilateral carotid arteries: Secondary | ICD-10-CM | POA: Diagnosis not present

## 2018-06-03 NOTE — Telephone Encounter (Signed)
Thanks, I have called [name], [phone] Dr Aline Brochure wants patient to work on extension, and she will not let him touch her at all, and is very guarded, so we know patient has been a challenge with therapy   Therapist states she bumped knee with increased pain. I called patient to let her know to continue the therapy, and use ice if it is still swelling. She said it is getting better now. I did instruct her to use ice, for swelling.   To you FYI

## 2018-06-03 NOTE — Telephone Encounter (Signed)
Stacie with [facility] left message on voicemail on Friday (05/31/18) stating that the order they have will be running out and they would like to extend the patient's physical therapy for 2 times a week for 3 more weeks. Also, she wanted to let us know that after the patient saw Dr. Aline Brochure on 05/27/18 she hit her knee and don't know on what, but it has been extremely sore ever since.  Please call Stacie at [phone]

## 2018-06-04 DIAGNOSIS — I480 Paroxysmal atrial fibrillation: Secondary | ICD-10-CM | POA: Diagnosis not present

## 2018-06-04 DIAGNOSIS — I110 Hypertensive heart disease with heart failure: Secondary | ICD-10-CM | POA: Diagnosis not present

## 2018-06-04 DIAGNOSIS — I2510 Atherosclerotic heart disease of native coronary artery without angina pectoris: Secondary | ICD-10-CM | POA: Diagnosis not present

## 2018-06-04 DIAGNOSIS — S83242D Other tear of medial meniscus, current injury, left knee, subsequent encounter: Secondary | ICD-10-CM | POA: Diagnosis not present

## 2018-06-04 DIAGNOSIS — I5032 Chronic diastolic (congestive) heart failure: Secondary | ICD-10-CM | POA: Diagnosis not present

## 2018-06-04 DIAGNOSIS — I6523 Occlusion and stenosis of bilateral carotid arteries: Secondary | ICD-10-CM | POA: Diagnosis not present

## 2018-06-05 DIAGNOSIS — S83242D Other tear of medial meniscus, current injury, left knee, subsequent encounter: Secondary | ICD-10-CM | POA: Diagnosis not present

## 2018-06-05 DIAGNOSIS — I2510 Atherosclerotic heart disease of native coronary artery without angina pectoris: Secondary | ICD-10-CM | POA: Diagnosis not present

## 2018-06-05 DIAGNOSIS — I6523 Occlusion and stenosis of bilateral carotid arteries: Secondary | ICD-10-CM | POA: Diagnosis not present

## 2018-06-05 DIAGNOSIS — I5032 Chronic diastolic (congestive) heart failure: Secondary | ICD-10-CM | POA: Diagnosis not present

## 2018-06-05 DIAGNOSIS — I480 Paroxysmal atrial fibrillation: Secondary | ICD-10-CM | POA: Diagnosis not present

## 2018-06-05 DIAGNOSIS — I110 Hypertensive heart disease with heart failure: Secondary | ICD-10-CM | POA: Diagnosis not present

## 2018-06-06 ENCOUNTER — Encounter: Payer: Self-pay | Admitting: Adult Health

## 2018-06-06 ENCOUNTER — Ambulatory Visit (INDEPENDENT_AMBULATORY_CARE_PROVIDER_SITE_OTHER): Payer: Medicare Other | Admitting: Adult Health

## 2018-06-06 VITALS — BP 130/70 | Temp 98.3°F | Wt 107.0 lb

## 2018-06-06 DIAGNOSIS — Z9889 Other specified postprocedural states: Secondary | ICD-10-CM | POA: Insufficient documentation

## 2018-06-06 DIAGNOSIS — I2510 Atherosclerotic heart disease of native coronary artery without angina pectoris: Secondary | ICD-10-CM

## 2018-06-06 DIAGNOSIS — F339 Major depressive disorder, recurrent, unspecified: Secondary | ICD-10-CM | POA: Diagnosis not present

## 2018-06-06 DIAGNOSIS — E876 Hypokalemia: Secondary | ICD-10-CM | POA: Diagnosis not present

## 2018-06-06 DIAGNOSIS — S83242D Other tear of medial meniscus, current injury, left knee, subsequent encounter: Secondary | ICD-10-CM | POA: Diagnosis not present

## 2018-06-06 DIAGNOSIS — E538 Deficiency of other specified B group vitamins: Secondary | ICD-10-CM | POA: Diagnosis not present

## 2018-06-06 LAB — CBC WITH DIFFERENTIAL/PLATELET
BASOS ABS: 0.1 10*3/uL (ref 0.0–0.1)
BASOS PCT: 1 % (ref 0.0–3.0)
EOS ABS: 0.1 10*3/uL (ref 0.0–0.7)
Eosinophils Relative: 1.9 % (ref 0.0–5.0)
HCT: 38.1 % (ref 36.0–46.0)
Hemoglobin: 12.9 g/dL (ref 12.0–15.0)
LYMPHS ABS: 1.8 10*3/uL (ref 0.7–4.0)
Lymphocytes Relative: 26.3 % (ref 12.0–46.0)
MCHC: 34 g/dL (ref 30.0–36.0)
MCV: 85.9 fl (ref 78.0–100.0)
Monocytes Absolute: 0.6 10*3/uL (ref 0.1–1.0)
Monocytes Relative: 8.8 % (ref 3.0–12.0)
NEUTROS ABS: 4.2 10*3/uL (ref 1.4–7.7)
NEUTROS PCT: 62 % (ref 43.0–77.0)
PLATELETS: 198 10*3/uL (ref 150.0–400.0)
RBC: 4.43 Mil/uL (ref 3.87–5.11)
RDW: 14.4 % (ref 11.5–15.5)
WBC: 6.7 10*3/uL (ref 4.0–10.5)

## 2018-06-06 LAB — BASIC METABOLIC PANEL
BUN: 10 mg/dL (ref 6–23)
CHLORIDE: 101 meq/L (ref 96–112)
CO2: 33 meq/L — AB (ref 19–32)
CREATININE: 0.91 mg/dL (ref 0.40–1.20)
Calcium: 8.9 mg/dL (ref 8.4–10.5)
GFR: 63.42 mL/min (ref >60.00)
GLUCOSE: 135 mg/dL — AB (ref 70–99)
Potassium: 4.7 mEq/L (ref 3.5–5.1)
SODIUM: 141 meq/L (ref 135–145)

## 2018-06-06 LAB — VITAMIN B12: Vitamin B-12: >1500 pg/mL — ABNORMAL HIGH (ref 211–911)

## 2018-06-06 MED ORDER — PAROXETINE HCL 20 MG PO TABS: 20.0000 mg | ORAL_TABLET | Freq: Every day | ORAL | 1 refills | Status: DC

## 2018-06-06 MED ORDER — CYANOCOBALAMIN 1000 MCG/ML IJ SOLN
1000.0000 ug | Freq: Once | INTRAMUSCULAR | Status: AC
  Administered 2018-06-06: 1000 ug via INTRAMUSCULAR

## 2018-06-06 NOTE — Progress Notes (Signed)
Subjective:    Patient ID: [name], female    DOB: [birth year], 77 y.o.   MRN: [id number]  HPI  77 year old female who  has a past medical history of Anemia (2005), Aortic stenosis, Arthritis, Broken back (2013), CAD (coronary artery disease), CHF (congestive heart failure) ([place]), Chronic bilateral pleural effusions, Chronic headache, Contrast media allergy, Diastolic heart failure ([place]), Esophageal cancer ([place]) (05/06/2015), Facial paresthesia, GERD (gastroesophageal reflux disease), H. pylori infection, H/O iron deficiency, HH (hiatus hernia) (2008), Hypertension, Paroxysmal atrial fibrillation ([place]), PONV (postoperative nausea and vomiting), Pulmonary emboli ([place]) (2008, 2012), and Stroke [facility]) (1982).  She presents to the office today ( with family)  for follow up after hospitalization on October 14th with transfer to SNF for rehab and released on November 15th.   Patient had a syncopal episode on 04/10/2018 while walking to her bathroom twice in the same day.  She stated that she felt nauseated prior to the fall became clammy and diaphoretic.  She twisted her left knee at that time and states that it been painful ever since.  She was unable to bear weight on the knee since the fall she was unable to make it to the emergency room when it first happened due to the severity pain in the knee.  She did report hitting her head and had a lot of pain in the posterior neck region had been becoming worse prior to admission.  ED she was noted to have a temperature of 100 F, but other vitals were otherwise stable.  Laboratory data was remarkable for a potassium of 2.5 and a glucose of 188.  Her INR was therapeutic at 2.4.  CT scan of the head and neck as well as x-rays left knee are unremarkable.  In the ER she received 40 mEq of IV and oral potassium supplementation with 1 L of IV fluid bolus.  Hospital Course  Syncopal Event  -Clear related to significant dehydration hypokalemia, secondary to  ongoing Lasix use at home without potassium supplementation ( potassium levels have been WNL in the past without K supplement).  -Lateral carotid artery stenosis which does not result in hemodynamically significant stenosis as per ultrasound on 12/2017.  It was recommended that she have plans for vascular follow-up after discharge.  Left Knee Pain  -MRI shows a near complete full-thickness radial tear of the posterior medial corner of the medial meniscus.  Slight dorsal bowing of the ACL, PCL is intact.  Deep infrapatellar bursitis. -She underwent surgery on 1017 (Left knee arthroscopy with partial medial meniscectomy and anterior cruciate ligament debridement) due to weakness and pain with ambulation, was recommended by PT that she be transferred to a skilled nursing facility for short-term rehab purposes  Hypokalemia  -She was placed on daily potassium supplement since on daily Lasix  H/o PE/Atrial Fibrillation  -Per surgery it was okay to resume Coumadin after it was reversed in anticipation of surgery  While in rehab her younger sister died.  She was placed on Paxil 10 mg due to depression by [employer] care  Today in the office she reports that she is "doing pretty well".  She continues to have mild to moderate discomfort in her left knee but feels as though this is improving with home PT.  She reports that PT was extended and she has another 2 weeks.  She continues to have trouble weightbearing and with flexion, sometimes has to use a cane or walker.  She is no longer taking oxycodone.  She denies any falls.   She continues to wear a knee brace.  She has followed up with orthopedic surgery, Dr. Aline Brochure, most recently on 05/27/2018 at which time he reported that he did not see any particular problem with her knee.  It was recommended that she increase her gabapentin to 400 mg 3 times a day as it was thought her pain may be more radicular or neurogenic pain. She is wondering if there is  anything is anything else   She denies nausea/vomiting/diarrhea since starting Paxil. Has been on this medication for about a month but has not noticed a whole lot of benefit from it. She continues to feel depressed and sad about her sister's passing as it sounds as though they were very close.   Review of Systems  Constitutional: Negative.   HENT: Negative.   Respiratory: Negative.   Cardiovascular: Negative.   Gastrointestinal: Negative.   Genitourinary: Negative.   Musculoskeletal: Positive for arthralgias and gait problem.  Skin: Negative.   Hematological: Negative.   Psychiatric/Behavioral: Positive for dysphoric mood. The patient is not nervous/anxious.   All other systems reviewed and are negative.  Past Medical History:  Diagnosis Date  . Anemia 2005   Generally microcytic, transfusions in [address], 2012, 02/2015, 05/2015  . Aortic stenosis    Moderate November 2017  . Arthritis   . Broken back 2013   Chronic back pain.   [name] CAD (coronary artery disease)    Cardiac catheterization 2014 - 80% mid RCA and 70% OM managed medically  . CHF (congestive heart failure) ([place])   . Chronic bilateral pleural effusions   . Chronic headache   . Contrast media allergy   . Diastolic heart failure ([place])   . Esophageal cancer ([place]) 05/06/2015   Adenocarcinoma GE junction  . Facial paresthesia   . GERD (gastroesophageal reflux disease)   . H. pylori infection   . H/O iron deficiency    05-06-15 iron infusion ([place])  . HH (hiatus hernia) 2008   Large with associated erosions  . Hypertension   . Paroxysmal atrial fibrillation ([name])   . PONV (postoperative nausea and vomiting)   . Pulmonary emboli ([place]) 2008, 2012  . Stroke [facility]) 1982    Social History   Socioeconomic History  . Marital status: Widowed    Spouse name: Not on file  . Number of children: 3  . Years of education: 58  . Highest education level: Not on file  Occupational History  . Occupation: Retired  Scientific laboratory technician    . Financial resource strain: Not on file  . Food insecurity:    Worry: Not on file    Inability: Not on file  . Transportation needs:    Medical: Not on file    Non-medical: Not on file  Tobacco Use  . Smoking status: Never Smoker  . Smokeless tobacco: Never Used  Substance and Sexual Activity  . Alcohol use: No    Alcohol/week: 0.0 standard drinks  . Drug use: No  . Sexual activity: Not Currently  Lifestyle  . Physical activity:    Days per week: Not on file    Minutes per session: Not on file  . Stress: Not on file  Relationships  . Social connections:    Talks on phone: Not on file    Gets together: Not on file    Attends religious service: Not on file    Active member of club or organization: Not on file    Attends meetings  of clubs or organizations: Not on file    Relationship status: Not on file  . Intimate partner violence:    Fear of current or ex partner: Not on file    Emotionally abused: Not on file    Physically abused: Not on file    Forced sexual activity: Not on file  Other Topics Concern  . Not on file  Social History Narrative   Born and raised in [place], [place]. Currently resides in a house with her son. 1 dog. Fun: go to church   Divorced(Has total of #3 children)-[place], [place], [place] Jun[place]   Denies religious beliefs that would effect health care.    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[employer]    Past Surgical History:  Procedure Laterality Date  . APPENDECTOMY  1950s  . BACK SURGERY    . CARPAL TUNNEL RELEASE Bilateral 1990s  . [place] SURGERY  2014  . CHOLECYSTECTOMY  1980s   open  . COLONOSCOPY N/A 02/17/2015   Procedure: COLONOSCOPY;  Surgeon: [name], MD;  Location: [facility] ENDOSCOPY;  Service: Endoscopy;  Laterality: N/A;  . ESOPHAGOGASTRODUODENOSCOPY N/A 02/16/2015   Procedure: ESOPHAGOGASTRODUODENOSCOPY (EGD);  Surgeon: [name], MD;  Location: [facility];  Service: Endoscopy;  Laterality: N/A;  . ESOPHAGOGASTRODUODENOSCOPY N/A 05/06/2015   Procedure: ESOPHAGOGASTRODUODENOSCOPY (EGD);  Surgeon: [name], MD;  Location: [facility] ENDOSCOPY;  Service: Endoscopy;  Laterality: N/A;  . EUS N/A 05/20/2015   Procedure: UPPER ENDOSCOPIC ULTRASOUND (EUS) LINEAR;  Surgeon: [name], MD;  Location: [facility] ENDOSCOPY;  Service: Endoscopy;  Laterality: N/A;  . exploratory lab  1950s or 1960s  . GIVENS CAPSULE STUDY N/A 05/06/2015   Procedure: GIVENS CAPSULE STUDY;  Surgeon: [name], MD;  Location: [facility] ENDOSCOPY;  Service: Endoscopy;  Laterality: N/A;  . KNEE ARTHROSCOPY WITH MEDIAL MENISECTOMY Left 04/18/2018   Procedure: LEFT KNEE ARTHROSCOPY WITH PARTIAL MEDIAL MENISECTOMY AND ANTERIOR CRUCIATE LIGAMENT DEBRIDEMENT;  Surgeon: [name], MD;  Location: [facility]S;  Service: Orthopedics;  Laterality: Left;  . LEFT HEART CATH AND CORONARY ANGIOGRAPHY N/A 11/08/2016   Procedure: Left Heart Cath and Coronary Angiography;  Surgeon: [name], MD;  Location: [facility] LAB;  Service: Cardiovascular;  Laterality: N/A;  . lumbar back surgery  2012  . [place] SURGERY  1991  . TONSILLECTOMY    . TONSILLECTOMY AND ADENOIDECTOMY  1960s    Family History  Problem Relation Age of Onset  . Stroke Mother   . Heart disease Mother   . Emphysema Father   . Ovarian cancer Sister   . Stroke Sister   . Other Child        died at birth    Allergies  Allergen Reactions  . Iodinated Diagnostic Agents Anaphylaxis and Other (See Comments)    IPD dye Info given by patient  . Ioxaglate Anaphylaxis and Other (See Comments)    Info given by patient  . Red Dye Anaphylaxis  . Lactose Intolerance (Gi)   . Whey Other (See Comments)    Lactose intolerance  . Darvon [Propoxyphene] Rash  . Hydralazine Anxiety and Other (See Comments)    Facial flushing, pt prefers not to use it.   . Milk-Related Compounds Other (See Comments)    Lactose  intolerance Can tolerate milk if its cooked into the recipe, just can't drink milk     Current Outpatient Medications on File Prior to Visit  Medication Sig Dispense Refill  . acetaminophen (TYLENOL) 500 MG tablet Take 500-1,000 mg by mouth daily as needed for mild pain or moderate pain.     [name] aspirin EC 81 MG tablet Take 1 tablet (81 mg total) by mouth daily. 90 tablet 3  . carvedilol (COREG) 25 MG tablet TAKE (1) TABLET BY MOUTH TWICE DAILY WITH A MEAL. 180 tablet 3  . ezetimibe-simvastatin (VYTORIN) 10-20 MG tablet Take 1 tablet by mouth daily. 30 tablet 6  . furosemide (LASIX) 40 MG tablet TAKE 1 TABLET BY MOUTH ONCE DAILY. 90 tablet 1  . gabapentin (NEURONTIN) 400 MG capsule Take 1 capsule (400 mg total) by mouth 3 (three) times daily. 90 capsule 0  . meclizine (ANTIVERT) 25 MG tablet Take 25 mg by mouth as needed for dizziness or nausea.     . ondansetron (ZOFRAN) 4 MG tablet Take 1 tablet (4 mg total) by mouth every 8 (eight) hours as needed for nausea or vomiting. 20 tablet 0  . oxyCODONE (OXY IR/ROXICODONE) 5 MG immediate release tablet Take 1 tablet (5 mg total) by mouth every 6 (six) hours as needed for breakthrough pain. 30 tablet 0  . pantoprazole (PROTONIX) 40 MG tablet TAKE 1 TABLET BY MOUTH TWICE DAILY. 180 tablet 2  . Pediatric Multivitamins-Iron (FLINTSTONES PLUS IRON) chewable tablet Chew 2 tablets by mouth daily. 60 tablet 0  . potassium chloride (K-DUR) 10 MEQ tablet Take 2 tablets (20 mEq total) by mouth daily. 30 tablet 0  . temazepam (RESTORIL) 15 MG capsule Take 1 capsule (15 mg total) by mouth at bedtime. 30 capsule 1  . traMADol (ULTRAM) 50 MG tablet Take 50 mg by mouth 3 (three) times daily.    [name] warfarin (COUMADIN) 5 MG tablet Take 1 tablet (5 mg total) by mouth daily. Take on Sun.,Tues., Thurs., 30 tablet 0   No current facility-administered medications on file prior to visit.     BP 130/70   Temp 98.3 F (36.8 C)   Wt 107 lb (48.5 kg)   BMI 19.57 kg/m        Objective:   Physical Exam  Constitutional: She is oriented to person, place, and time. She appears well-developed and well-nourished. No distress.  Cardiovascular: Normal rate, regular rhythm, normal heart sounds and intact distal pulses. Exam reveals no gallop and no friction rub.  No murmur heard. Pulmonary/Chest: Effort normal and breath sounds normal.  Musculoskeletal: Normal range of motion. She exhibits tenderness. She exhibits no edema or deformity.  Pain is located in the posterior part of her left knee and posterior part of thigh. Mild tenderness with palpation along knee. No swelling, bruising, or warmth noted   Walks with slow limping gait   Neurological: She is alert and oriented to person, place, and time. She displays normal reflexes. No cranial nerve deficit or sensory deficit. She exhibits normal muscle tone. Coordination abnormal.  Skin: Skin is warm and dry. Capillary refill takes less than 2 seconds. She is not diaphoretic.  Psychiatric: She has a normal mood and affect. Her behavior is normal. Judgment and thought content normal.  Nursing note and vitals reviewed.     Assessment & Plan:  1. Hypokalemia - Consider change in dose  - Basic Metabolic Panel - CBC with Differential/Platelet - Vitamin B12  2. Depression, recurrent ([place]) - Will increase Paxil from 10 mg to 20 mg.  - Follow up in one month or sooner if needed - PARoxetine (PAXIL) 20 MG tablet; Take 1 tablet (  20 mg total) by mouth daily.  Dispense: 90 tablet; Refill: 1  3. B12 deficiency - Vitamin B12 - cyanocobalamin ((VITAMIN B-12)) injection 1,000 mcg  4. Acute medial meniscus tear, left, subsequent encounter - Continue with home health PT  - Follow up with orthopedics as directed

## 2018-06-07 ENCOUNTER — Ambulatory Visit (INDEPENDENT_AMBULATORY_CARE_PROVIDER_SITE_OTHER): Payer: 59

## 2018-06-07 ENCOUNTER — Ambulatory Visit (INDEPENDENT_AMBULATORY_CARE_PROVIDER_SITE_OTHER): Payer: Medicare Other | Admitting: Orthopedic Surgery

## 2018-06-07 VITALS — BP 120/64 | HR 77 | Ht 62.0 in | Wt 111.0 lb

## 2018-06-07 DIAGNOSIS — G8929 Other chronic pain: Secondary | ICD-10-CM

## 2018-06-07 DIAGNOSIS — Z9889 Other specified postprocedural states: Secondary | ICD-10-CM

## 2018-06-07 DIAGNOSIS — M25562 Pain in left knee: Secondary | ICD-10-CM

## 2018-06-07 MED ORDER — POTASSIUM CHLORIDE ER 10 MEQ PO TBCR: 10.0000 meq | EXTENDED_RELEASE_TABLET | Freq: Every day | ORAL | 3 refills | Status: DC

## 2018-06-07 NOTE — Patient Instructions (Signed)
Continue your physical therapy  Continue the walker  Continue the brace  Follow-up in 5 weeks    You have received an injection of steroids into the joint. 15% of patients will have increased pain within the 24 hours postinjection.   This is transient and will go away.   We recommend that you use ice packs on the injection site for 20 minutes every 2 hours and extra strength Tylenol 2 tablets every 8 as needed until the pain resolves.  If you continue to have pain after taking the Tylenol and using the ice please call the office for further instructions.

## 2018-06-07 NOTE — Progress Notes (Signed)
GLOBAL PERIOD POST OP APPT   POD 50  Encounter Diagnoses  Name Primary?  . S/P left knee arthroscopy 04/18/18   . Chronic pain of left knee Yes    Mrs. Farro has done well with physical therapy she is improved her range of motion to 10-120 degrees she complains of medial and lateral joint line pain  X-ray was taken today shows medial joint space narrowing with valgus alignment to the left knee.  There is no joint effusion she does have painful range of motion again tenderness is noted on the medial lateral joint lines she is walking with a walker and a hinged knee brace  I gave her a cortisone injection to assist with pain relief  She will continue physical therapy bracing walker and see me again in 5 weeks  Procedure note left knee injection verbal consent was obtained to inject left knee joint  Timeout was completed to confirm the site of injection  The medications used were 40 mg of Depo-Medrol and 1% lidocaine 3 cc  Anesthesia was provided by ethyl chloride and the skin was prepped with alcohol.  After cleaning the skin with alcohol a 20-gauge needle was used to inject the left knee joint. There were no complications. A sterile bandage was applied.

## 2018-06-10 DIAGNOSIS — I480 Paroxysmal atrial fibrillation: Secondary | ICD-10-CM | POA: Diagnosis not present

## 2018-06-10 DIAGNOSIS — I5032 Chronic diastolic (congestive) heart failure: Secondary | ICD-10-CM | POA: Diagnosis not present

## 2018-06-10 DIAGNOSIS — I110 Hypertensive heart disease with heart failure: Secondary | ICD-10-CM | POA: Diagnosis not present

## 2018-06-10 DIAGNOSIS — S83242D Other tear of medial meniscus, current injury, left knee, subsequent encounter: Secondary | ICD-10-CM | POA: Diagnosis not present

## 2018-06-10 DIAGNOSIS — I6523 Occlusion and stenosis of bilateral carotid arteries: Secondary | ICD-10-CM | POA: Diagnosis not present

## 2018-06-10 DIAGNOSIS — I2510 Atherosclerotic heart disease of native coronary artery without angina pectoris: Secondary | ICD-10-CM | POA: Diagnosis not present

## 2018-06-11 ENCOUNTER — Telehealth: Payer: Self-pay | Admitting: Adult Health

## 2018-06-11 ENCOUNTER — Ambulatory Visit (INDEPENDENT_AMBULATORY_CARE_PROVIDER_SITE_OTHER): Payer: Medicare Other | Admitting: General Practice

## 2018-06-11 DIAGNOSIS — I480 Paroxysmal atrial fibrillation: Secondary | ICD-10-CM | POA: Diagnosis not present

## 2018-06-11 DIAGNOSIS — I110 Hypertensive heart disease with heart failure: Secondary | ICD-10-CM | POA: Diagnosis not present

## 2018-06-11 DIAGNOSIS — S83242D Other tear of medial meniscus, current injury, left knee, subsequent encounter: Secondary | ICD-10-CM | POA: Diagnosis not present

## 2018-06-11 DIAGNOSIS — I5032 Chronic diastolic (congestive) heart failure: Secondary | ICD-10-CM | POA: Diagnosis not present

## 2018-06-11 DIAGNOSIS — I4891 Unspecified atrial fibrillation: Secondary | ICD-10-CM

## 2018-06-11 DIAGNOSIS — I6523 Occlusion and stenosis of bilateral carotid arteries: Secondary | ICD-10-CM | POA: Diagnosis not present

## 2018-06-11 DIAGNOSIS — I2510 Atherosclerotic heart disease of native coronary artery without angina pectoris: Secondary | ICD-10-CM | POA: Diagnosis not present

## 2018-06-11 DIAGNOSIS — Z7901 Long term (current) use of anticoagulants: Secondary | ICD-10-CM

## 2018-06-11 LAB — POCT INR: INR: 4 — AB (ref 2.0–3.0)

## 2018-06-11 NOTE — Patient Instructions (Signed)
Pre visit review using our clinic review tool, if applicable. No additional management support is needed unless otherwise documented below in the visit note.  Please hold coumadin today and tomorrow and then continue to take 1 tablet daily except for 1/2  tablet on Monday/Wed/Fridays/Saturdays.  Re-check in 2 weeks. Dosing instructions given to Clarise Cruz, RN @ [facility] while in patient's home. [phone]

## 2018-06-11 NOTE — Telephone Encounter (Signed)
[name] calling to report that INR results are 4.0. [name] reports that the pt has not had any changes. [name] can be contacted at [phone].

## 2018-06-13 ENCOUNTER — Inpatient Hospital Stay: Payer: Medicare Other | Attending: Oncology

## 2018-06-13 ENCOUNTER — Inpatient Hospital Stay: Payer: Medicare Other | Admitting: Oncology

## 2018-06-13 DIAGNOSIS — I2510 Atherosclerotic heart disease of native coronary artery without angina pectoris: Secondary | ICD-10-CM | POA: Diagnosis not present

## 2018-06-13 DIAGNOSIS — I480 Paroxysmal atrial fibrillation: Secondary | ICD-10-CM | POA: Diagnosis not present

## 2018-06-13 DIAGNOSIS — I6523 Occlusion and stenosis of bilateral carotid arteries: Secondary | ICD-10-CM | POA: Diagnosis not present

## 2018-06-13 DIAGNOSIS — S83242D Other tear of medial meniscus, current injury, left knee, subsequent encounter: Secondary | ICD-10-CM | POA: Diagnosis not present

## 2018-06-13 DIAGNOSIS — I5032 Chronic diastolic (congestive) heart failure: Secondary | ICD-10-CM | POA: Diagnosis not present

## 2018-06-13 DIAGNOSIS — I110 Hypertensive heart disease with heart failure: Secondary | ICD-10-CM | POA: Diagnosis not present

## 2018-06-17 ENCOUNTER — Telehealth: Payer: Self-pay | Admitting: Oncology

## 2018-06-17 DIAGNOSIS — I480 Paroxysmal atrial fibrillation: Secondary | ICD-10-CM | POA: Diagnosis not present

## 2018-06-17 DIAGNOSIS — I110 Hypertensive heart disease with heart failure: Secondary | ICD-10-CM | POA: Diagnosis not present

## 2018-06-17 DIAGNOSIS — I6523 Occlusion and stenosis of bilateral carotid arteries: Secondary | ICD-10-CM | POA: Diagnosis not present

## 2018-06-17 DIAGNOSIS — I5032 Chronic diastolic (congestive) heart failure: Secondary | ICD-10-CM | POA: Diagnosis not present

## 2018-06-17 DIAGNOSIS — S83242D Other tear of medial meniscus, current injury, left knee, subsequent encounter: Secondary | ICD-10-CM | POA: Diagnosis not present

## 2018-06-17 DIAGNOSIS — I2510 Atherosclerotic heart disease of native coronary artery without angina pectoris: Secondary | ICD-10-CM | POA: Diagnosis not present

## 2018-06-17 NOTE — Telephone Encounter (Signed)
Called patient per 12/13 sch message - to r/s missed appt - pt unavailable - left message for patient to call back to r/s

## 2018-06-18 ENCOUNTER — Ambulatory Visit (INDEPENDENT_AMBULATORY_CARE_PROVIDER_SITE_OTHER): Payer: Medicare Other | Admitting: General Practice

## 2018-06-18 DIAGNOSIS — I480 Paroxysmal atrial fibrillation: Secondary | ICD-10-CM | POA: Diagnosis not present

## 2018-06-18 DIAGNOSIS — I2510 Atherosclerotic heart disease of native coronary artery without angina pectoris: Secondary | ICD-10-CM | POA: Diagnosis not present

## 2018-06-18 DIAGNOSIS — I4891 Unspecified atrial fibrillation: Secondary | ICD-10-CM | POA: Diagnosis not present

## 2018-06-18 DIAGNOSIS — I6523 Occlusion and stenosis of bilateral carotid arteries: Secondary | ICD-10-CM | POA: Diagnosis not present

## 2018-06-18 DIAGNOSIS — Z7901 Long term (current) use of anticoagulants: Secondary | ICD-10-CM | POA: Diagnosis not present

## 2018-06-18 DIAGNOSIS — S83242D Other tear of medial meniscus, current injury, left knee, subsequent encounter: Secondary | ICD-10-CM | POA: Diagnosis not present

## 2018-06-18 DIAGNOSIS — I5032 Chronic diastolic (congestive) heart failure: Secondary | ICD-10-CM | POA: Diagnosis not present

## 2018-06-18 DIAGNOSIS — I110 Hypertensive heart disease with heart failure: Secondary | ICD-10-CM | POA: Diagnosis not present

## 2018-06-18 LAB — POCT INR: INR: 2 (ref 2.0–3.0)

## 2018-06-18 NOTE — Patient Instructions (Signed)
Pre visit review using our clinic review tool, if applicable. No additional management support is needed unless otherwise documented below in the visit note.  Continue to take 1 tablet daily except for 1/2  tablet on Monday/Wed/Fridays/Saturdays.  Re-check in 2 weeks. Dosing instructions given to [place], RN @ [facility] while in patient's home. [phone].

## 2018-06-19 DIAGNOSIS — I110 Hypertensive heart disease with heart failure: Secondary | ICD-10-CM | POA: Diagnosis not present

## 2018-06-19 DIAGNOSIS — I2510 Atherosclerotic heart disease of native coronary artery without angina pectoris: Secondary | ICD-10-CM | POA: Diagnosis not present

## 2018-06-19 DIAGNOSIS — I6523 Occlusion and stenosis of bilateral carotid arteries: Secondary | ICD-10-CM | POA: Diagnosis not present

## 2018-06-19 DIAGNOSIS — S83242D Other tear of medial meniscus, current injury, left knee, subsequent encounter: Secondary | ICD-10-CM | POA: Diagnosis not present

## 2018-06-19 DIAGNOSIS — I5032 Chronic diastolic (congestive) heart failure: Secondary | ICD-10-CM | POA: Diagnosis not present

## 2018-06-19 DIAGNOSIS — I480 Paroxysmal atrial fibrillation: Secondary | ICD-10-CM | POA: Diagnosis not present

## 2018-06-21 DIAGNOSIS — I480 Paroxysmal atrial fibrillation: Secondary | ICD-10-CM | POA: Diagnosis not present

## 2018-06-21 DIAGNOSIS — S83242D Other tear of medial meniscus, current injury, left knee, subsequent encounter: Secondary | ICD-10-CM | POA: Diagnosis not present

## 2018-06-21 DIAGNOSIS — I6523 Occlusion and stenosis of bilateral carotid arteries: Secondary | ICD-10-CM | POA: Diagnosis not present

## 2018-06-21 DIAGNOSIS — I2510 Atherosclerotic heart disease of native coronary artery without angina pectoris: Secondary | ICD-10-CM | POA: Diagnosis not present

## 2018-06-21 DIAGNOSIS — I5032 Chronic diastolic (congestive) heart failure: Secondary | ICD-10-CM | POA: Diagnosis not present

## 2018-06-21 DIAGNOSIS — I110 Hypertensive heart disease with heart failure: Secondary | ICD-10-CM | POA: Diagnosis not present

## 2018-06-25 DIAGNOSIS — I480 Paroxysmal atrial fibrillation: Secondary | ICD-10-CM | POA: Diagnosis not present

## 2018-06-25 DIAGNOSIS — I6523 Occlusion and stenosis of bilateral carotid arteries: Secondary | ICD-10-CM | POA: Diagnosis not present

## 2018-06-25 DIAGNOSIS — I110 Hypertensive heart disease with heart failure: Secondary | ICD-10-CM | POA: Diagnosis not present

## 2018-06-25 DIAGNOSIS — I5032 Chronic diastolic (congestive) heart failure: Secondary | ICD-10-CM | POA: Diagnosis not present

## 2018-06-25 DIAGNOSIS — S83242D Other tear of medial meniscus, current injury, left knee, subsequent encounter: Secondary | ICD-10-CM | POA: Diagnosis not present

## 2018-06-25 DIAGNOSIS — I2510 Atherosclerotic heart disease of native coronary artery without angina pectoris: Secondary | ICD-10-CM | POA: Diagnosis not present

## 2018-06-27 DIAGNOSIS — I2510 Atherosclerotic heart disease of native coronary artery without angina pectoris: Secondary | ICD-10-CM | POA: Diagnosis not present

## 2018-06-27 DIAGNOSIS — I110 Hypertensive heart disease with heart failure: Secondary | ICD-10-CM | POA: Diagnosis not present

## 2018-06-27 DIAGNOSIS — I6523 Occlusion and stenosis of bilateral carotid arteries: Secondary | ICD-10-CM | POA: Diagnosis not present

## 2018-06-27 DIAGNOSIS — I480 Paroxysmal atrial fibrillation: Secondary | ICD-10-CM | POA: Diagnosis not present

## 2018-06-27 DIAGNOSIS — S83242D Other tear of medial meniscus, current injury, left knee, subsequent encounter: Secondary | ICD-10-CM | POA: Diagnosis not present

## 2018-06-27 DIAGNOSIS — I5032 Chronic diastolic (congestive) heart failure: Secondary | ICD-10-CM | POA: Diagnosis not present

## 2018-06-28 DIAGNOSIS — I480 Paroxysmal atrial fibrillation: Secondary | ICD-10-CM | POA: Diagnosis not present

## 2018-06-28 DIAGNOSIS — I6523 Occlusion and stenosis of bilateral carotid arteries: Secondary | ICD-10-CM | POA: Diagnosis not present

## 2018-06-28 DIAGNOSIS — I5032 Chronic diastolic (congestive) heart failure: Secondary | ICD-10-CM | POA: Diagnosis not present

## 2018-06-28 DIAGNOSIS — I2510 Atherosclerotic heart disease of native coronary artery without angina pectoris: Secondary | ICD-10-CM | POA: Diagnosis not present

## 2018-06-28 DIAGNOSIS — S83242D Other tear of medial meniscus, current injury, left knee, subsequent encounter: Secondary | ICD-10-CM | POA: Diagnosis not present

## 2018-06-28 DIAGNOSIS — I110 Hypertensive heart disease with heart failure: Secondary | ICD-10-CM | POA: Diagnosis not present

## 2018-07-01 DIAGNOSIS — I480 Paroxysmal atrial fibrillation: Secondary | ICD-10-CM | POA: Diagnosis not present

## 2018-07-01 DIAGNOSIS — I5032 Chronic diastolic (congestive) heart failure: Secondary | ICD-10-CM | POA: Diagnosis not present

## 2018-07-01 DIAGNOSIS — I6523 Occlusion and stenosis of bilateral carotid arteries: Secondary | ICD-10-CM | POA: Diagnosis not present

## 2018-07-01 DIAGNOSIS — I2510 Atherosclerotic heart disease of native coronary artery without angina pectoris: Secondary | ICD-10-CM | POA: Diagnosis not present

## 2018-07-01 DIAGNOSIS — S83242D Other tear of medial meniscus, current injury, left knee, subsequent encounter: Secondary | ICD-10-CM | POA: Diagnosis not present

## 2018-07-01 DIAGNOSIS — I110 Hypertensive heart disease with heart failure: Secondary | ICD-10-CM | POA: Diagnosis not present

## 2018-07-04 ENCOUNTER — Telehealth: Payer: Self-pay | Admitting: Adult Health

## 2018-07-04 ENCOUNTER — Ambulatory Visit (INDEPENDENT_AMBULATORY_CARE_PROVIDER_SITE_OTHER): Payer: Medicare Other | Admitting: General Practice

## 2018-07-04 DIAGNOSIS — S83242D Other tear of medial meniscus, current injury, left knee, subsequent encounter: Secondary | ICD-10-CM | POA: Diagnosis not present

## 2018-07-04 DIAGNOSIS — I480 Paroxysmal atrial fibrillation: Secondary | ICD-10-CM | POA: Diagnosis not present

## 2018-07-04 DIAGNOSIS — I110 Hypertensive heart disease with heart failure: Secondary | ICD-10-CM | POA: Diagnosis not present

## 2018-07-04 DIAGNOSIS — I5032 Chronic diastolic (congestive) heart failure: Secondary | ICD-10-CM | POA: Diagnosis not present

## 2018-07-04 DIAGNOSIS — I2510 Atherosclerotic heart disease of native coronary artery without angina pectoris: Secondary | ICD-10-CM | POA: Diagnosis not present

## 2018-07-04 DIAGNOSIS — I4891 Unspecified atrial fibrillation: Secondary | ICD-10-CM

## 2018-07-04 DIAGNOSIS — Z7901 Long term (current) use of anticoagulants: Secondary | ICD-10-CM | POA: Diagnosis not present

## 2018-07-04 DIAGNOSIS — I6523 Occlusion and stenosis of bilateral carotid arteries: Secondary | ICD-10-CM | POA: Diagnosis not present

## 2018-07-04 LAB — POCT INR: INR: 3.7 — AB (ref 2.0–3.0)

## 2018-07-04 NOTE — Telephone Encounter (Signed)
Copied from [place] [phone]. Topic: Quick Communication - See Telephone Encounter >> Jul 04, 2018  1:30 PM Antonieta Iba C wrote: CRM for notification. See Telephone encounter for: 07/04/18.  Suezanne Jacquet RN - [phone] - home health is calling in to update PCP/CMA with pt's Protime - 44.9 and INR - 3.7 which is elevated.   Maudie Mercury said that today is pt's last home nursing visit.

## 2018-07-04 NOTE — Patient Instructions (Addendum)
Pre visit review using our clinic review tool, if applicable. No additional management support is needed unless otherwise documented below in the visit note.  Hold coumadin today (1/2) and then continue to take 1 tablet daily except for 1/2  tablet on Monday/Wed/Fridays/Saturdays.  Re-check in 3 weeks at the [place] office.   Dosing instructions given to Vista Lawman, RN @ [facility] while in patient's home. [phone].  Patient will be discharged from [facility] today, 07/04/2018.  Dosing instructions left on patient's VM.

## 2018-07-04 NOTE — Telephone Encounter (Signed)
Noted  

## 2018-07-08 ENCOUNTER — Other Ambulatory Visit: Payer: Self-pay | Admitting: Adult Health

## 2018-07-11 ENCOUNTER — Other Ambulatory Visit: Payer: Self-pay | Admitting: General Practice

## 2018-07-11 MED ORDER — WARFARIN SODIUM 5 MG PO TABS: ORAL_TABLET | ORAL | 3 refills | Status: DC

## 2018-07-12 ENCOUNTER — Ambulatory Visit (INDEPENDENT_AMBULATORY_CARE_PROVIDER_SITE_OTHER): Payer: 59

## 2018-07-12 ENCOUNTER — Ambulatory Visit (INDEPENDENT_AMBULATORY_CARE_PROVIDER_SITE_OTHER): Payer: Medicare Other | Admitting: Orthopedic Surgery

## 2018-07-12 ENCOUNTER — Encounter: Payer: Self-pay | Admitting: Orthopedic Surgery

## 2018-07-12 VITALS — BP 134/71 | HR 69 | Ht 62.0 in | Wt 108.0 lb

## 2018-07-12 DIAGNOSIS — M79642 Pain in left hand: Secondary | ICD-10-CM

## 2018-07-12 DIAGNOSIS — M19042 Primary osteoarthritis, left hand: Secondary | ICD-10-CM

## 2018-07-12 DIAGNOSIS — Z9889 Other specified postprocedural states: Secondary | ICD-10-CM

## 2018-07-12 DIAGNOSIS — M19049 Primary osteoarthritis, unspecified hand: Secondary | ICD-10-CM

## 2018-07-12 DIAGNOSIS — M19041 Primary osteoarthritis, right hand: Secondary | ICD-10-CM | POA: Diagnosis not present

## 2018-07-12 DIAGNOSIS — M79641 Pain in right hand: Secondary | ICD-10-CM

## 2018-07-12 NOTE — Progress Notes (Signed)
Progress Note   Patient ID: [name], female   DOB: [birth year], 78 y.o.   MRN: [id number]   Chief Complaint  Patient presents with  . Routine Post Op    Lt knee DOS 04/18/18  . Hand Pain    Bilat hands x 56 wk    78 year old female with a new problem in the postoperative.  She complains of pain in her left and right thumb with swelling.  She has had pain there for a long time is gotten worse over the last 2 weeks it is constant it is not improving.  The pain is severe.  It interferes with her activities of daily living that involve fine motor tasks.  Nothing has made it better but she has not had any medication for it yet.    Review of Systems  Skin: Negative.   Neurological: Negative for tingling.     Past Medical History:  Diagnosis Date  . Anemia 2005   Generally microcytic, transfusions in [address], 2012, 02/2015, 05/2015  . Aortic stenosis    Moderate November 2017  . Arthritis   . Broken back 2013   Chronic back pain.   [name] CAD (coronary artery disease)    Cardiac catheterization 2014 - 80% mid RCA and 70% OM managed medically  . CHF (congestive heart failure) ([place])   . Chronic bilateral pleural effusions   . Chronic headache   . Contrast media allergy   . Diastolic heart failure ([place])   . Esophageal cancer ([place]) 05/06/2015   Adenocarcinoma GE junction  . Facial paresthesia   . GERD (gastroesophageal reflux disease)   . H. pylori infection   . H/O iron deficiency    05-06-15 iron infusion ([facility])  . HH (hiatus hernia) 2008   Large with associated erosions  . Hypertension   . Paroxysmal atrial fibrillation ([name])   . PONV (postoperative nausea and vomiting)   . Pulmonary emboli ([place]) 2008, 2012  . Stroke [facility]) 1982     Allergies  Allergen Reactions  . Iodinated Diagnostic Agents Anaphylaxis and Other (See Comments)    IPD dye Info given by patient  . Ioxaglate Anaphylaxis and Other (See Comments)    Info given by patient  . Red Dye Anaphylaxis  .  Lactose Intolerance (Gi)   . Whey Other (See Comments)    Lactose intolerance  . Darvon [Propoxyphene] Rash  . Hydralazine Anxiety and Other (See Comments)    Facial flushing, pt prefers not to use it.   . Milk-Related Compounds Other (See Comments)    Lactose intolerance Can tolerate milk if its cooked into the recipe, just can't drink milk      BP 134/71   Pulse 69   Ht 5\' 2"  (1.575 m)   Wt 108 lb (49 kg)   BMI 19.75 kg/m   Physical Exam Vitals signs reviewed.  Constitutional:      Appearance: She is well-developed.  Musculoskeletal:     Right hand: She exhibits decreased range of motion and tenderness. Decreased strength noted.       Hands:  Neurological:     Mental Status: She is alert and oriented to person, place, and time.     Gait: Gait abnormal.     Comments: She is ambulating with a brace on her left knee economy hinge and a walker.  She used a walker prior to surgery on occasion when she felt tired but the majority the time she used no assistive devices  Psychiatric:  Attention and Perception: Attention normal.        Mood and Affect: Mood and affect normal.        Speech: Speech normal.        Behavior: Behavior normal.        Thought Content: Thought content normal.        Judgment: Judgment normal.      Medical decisions:   Data  Imaging:   X-ray right hand x-ray left hand see separate cover.  X-rays show CMC arthritis severe with subluxation of the joint  Encounter Diagnosis  Name Primary?  . S/P left knee arthroscopy 04/18/18 Yes    PLAN:   Recommend bilateral injections of the [facility] joint and splinting  Injection left thumb CMC joint The patient gave consent  The patient identified left joint as proper site.  Site confirmed.  Skin cleaned with alcohol and anesthetized with ethyl chloride  Depo-Medrol 1 mg 2 cc 1% lidocaine  25-gauge needle  3 cc syringe  Injected no complications  This was repeated exactly the same in the  right thumb  Postop visit status post arthroscopy debridement of ACL on April 18, 2018 patient presents with her walker and brace now with range of motion 5 degrees - 125 degrees.  She has a new complaint as explained above  For the knee we will continue with range of motion strengthening exercises try to transition to a cane    [name], MD 07/12/2018 11:15 AM

## 2018-07-12 NOTE — Patient Instructions (Signed)
Continue with your knee brace and walker if you have a cane you can try to use that as well in the house  You also have arthritis of both thumbs.  You have received injection and splints which she will wear full-time except for bathing and for times when you need your thumb for activities  You have received an injection of steroids into the joint. 15% of patients will have increased pain within the 24 hours postinjection.   This is transient and will go away.   We recommend that you use ice packs on the injection site for 20 minutes every 2 hours and extra strength Tylenol 2 tablets every 8 as needed until the pain resolves.  If you continue to have pain after taking the Tylenol and using the ice please call the office for further instructions.

## 2018-07-16 ENCOUNTER — Telehealth: Payer: Self-pay | Admitting: Family Medicine

## 2018-07-16 NOTE — Telephone Encounter (Signed)
Received a fax from [facility] requesting to give the pt a 5mg  tab for 1 month.  Pt reports she is taking 5 mg on Sun, Tues and Thurs.  Taking 2.5 on Monday, Wed, Fri and Saturday.

## 2018-07-16 NOTE — Telephone Encounter (Signed)
Noted  

## 2018-08-02 ENCOUNTER — Encounter: Payer: Self-pay | Admitting: Adult Health

## 2018-08-02 ENCOUNTER — Ambulatory Visit (INDEPENDENT_AMBULATORY_CARE_PROVIDER_SITE_OTHER): Payer: Medicare Other | Admitting: Adult Health

## 2018-08-02 VITALS — BP 138/76 | Temp 98.0°F | Wt 103.0 lb

## 2018-08-02 DIAGNOSIS — Z7901 Long term (current) use of anticoagulants: Secondary | ICD-10-CM | POA: Diagnosis not present

## 2018-08-02 DIAGNOSIS — F339 Major depressive disorder, recurrent, unspecified: Secondary | ICD-10-CM | POA: Diagnosis not present

## 2018-08-02 DIAGNOSIS — Z760 Encounter for issue of repeat prescription: Secondary | ICD-10-CM | POA: Diagnosis not present

## 2018-08-02 DIAGNOSIS — E538 Deficiency of other specified B group vitamins: Secondary | ICD-10-CM

## 2018-08-02 LAB — POCT INR: INR: 5.4 — AB (ref 2.0–3.0)

## 2018-08-02 MED ORDER — TEMAZEPAM 15 MG PO CAPS: 15.0000 mg | ORAL_CAPSULE | Freq: Every day | ORAL | 1 refills | Status: DC

## 2018-08-02 MED ORDER — PAROXETINE HCL 30 MG PO TABS: 30.0000 mg | ORAL_TABLET | Freq: Every day | ORAL | 1 refills | Status: DC

## 2018-08-02 MED ORDER — CYANOCOBALAMIN 1000 MCG/ML IJ SOLN
1000.0000 ug | Freq: Once | INTRAMUSCULAR | Status: AC
  Administered 2018-08-02: 1000 ug via INTRAMUSCULAR

## 2018-08-02 MED ORDER — MECLIZINE HCL 25 MG PO TABS: 25.0000 mg | ORAL_TABLET | ORAL | 1 refills | Status: DC | PRN

## 2018-08-02 NOTE — Progress Notes (Signed)
Subjective:    Patient ID: [name], female    DOB: 1941-06-15, 78 y.o.   MRN: [id number]  HPI  78 year old female who  has a past medical history of Anemia (2005), Aortic stenosis, Arthritis, Broken back (2013), CAD (coronary artery disease), CHF (congestive heart failure) ([place]), Chronic bilateral pleural effusions, Chronic headache, Contrast media allergy, Diastolic heart failure ([place]), Esophageal cancer ([place]) (05/06/2015), Facial paresthesia, GERD (gastroesophageal reflux disease), H. pylori infection, H/O iron deficiency, HH (hiatus hernia) (2008), Hypertension, Paroxysmal atrial fibrillation ([place]), PONV (postoperative nausea and vomiting), Pulmonary emboli ([place]) (2008, 2012), and Stroke [facility]) (1982).  She presents to the office today for multiple issues   1. She needs to have her INR checked - her last was 07/04/2018 with a reading of 3.7. Directions for Warfarin are currently Take 1/2 tablet daily except 1 tablet on Sun Tues and Thurs or   2.Needs B12 injection   3. Follow up of depression. She was placed on Paxil in November after depression started following the passing of her sister. In December, paxil was increased to 20 mg. Today in the office she reports that she has started to feel some benefit from the medication but still feels depressed. She reports that she has not been out of the house much since her last visit here, a month ago. She denies suicidal ideation.   4. She would like to d/c gabapentin. She reports that she was not finding any benefit in the medication for chronic knee pain, so she stopped taking. She denies going through withdrawal symptoms.   Review of Systems See HPI   Past Medical History:  Diagnosis Date  . Anemia 2005   Generally microcytic, transfusions in [address], 2012, 02/2015, 05/2015  . Aortic stenosis    Moderate November 2017  . Arthritis   . Broken back 2013   Chronic back pain.   [name] CAD (coronary artery disease)    Cardiac catheterization  2014 - 80% mid RCA and 70% OM managed medically  . CHF (congestive heart failure) ([place])   . Chronic bilateral pleural effusions   . Chronic headache   . Contrast media allergy   . Diastolic heart failure ([place])   . Esophageal cancer ([place]) 05/06/2015   Adenocarcinoma GE junction  . Facial paresthesia   . GERD (gastroesophageal reflux disease)   . H. pylori infection   . H/O iron deficiency    05-06-15 iron infusion ([facility])  . HH (hiatus hernia) 2008   Large with associated erosions  . Hypertension   . Paroxysmal atrial fibrillation ([name])   . PONV (postoperative nausea and vomiting)   . Pulmonary emboli ([place]) 2008, 2012  . Stroke [facility]) 1982    Social History   Socioeconomic History  . Marital status: Widowed    Spouse name: Not on file  . Number of children: 3  . Years of education: 59  . Highest education level: Not on file  Occupational History  . Occupation: Retired  Scientific laboratory technician  . Financial resource strain: Not on file  . Food insecurity:    Worry: Not on file    Inability: Not on file  . Transportation needs:    Medical: Not on file    Non-medical: Not on file  Tobacco Use  . Smoking status: Never Smoker  . Smokeless tobacco: Never Used  Substance and Sexual Activity  . Alcohol use: No    Alcohol/week: 0.0 standard drinks  . Drug use: No  . Sexual activity:  Not Currently  Lifestyle  . Physical activity:    Days per week: Not on file    Minutes per session: Not on file  . Stress: Not on file  Relationships  . Social connections:    Talks on phone: Not on file    Gets together: Not on file    Attends religious service: Not on file    Active member of club or organization: Not on file    Attends meetings of clubs or organizations: Not on file    Relationship status: Not on file  . Intimate partner violence:    Fear of current or ex partner: Not on file    Emotionally abused: Not on file    Physically abused: Not on file    Forced sexual activity: Not on  file  Other Topics Concern  . Not on file  Social History Narrative   Born and raised in [place], [place]. Currently resides in a house with her son. 1 dog. Fun: go to church   Divorced(Has total of #3 children)-[place]ig, [place], Honor Junes   Denies religious beliefs that would effect health care.    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[employer]    Past Surgical History:  Procedure Laterality Date  . APPENDECTOMY  1950s  . BACK SURGERY    . CARPAL TUNNEL RELEASE Bilateral 1990s  . [place] SURGERY  2014  . CHOLECYSTECTOMY  1980s   open  . COLONOSCOPY N/A 02/17/2015   Procedure: COLONOSCOPY;  Surgeon: [name], MD;  Location: [facility] ENDOSCOPY;  Service: Endoscopy;  Laterality: N/A;  . ESOPHAGOGASTRODUODENOSCOPY N/A 02/16/2015   Procedure: ESOPHAGOGASTRODUODENOSCOPY (EGD);  Surgeon: [name], MD;  Location: [facility];  Service: Endoscopy;  Laterality: N/A;  . ESOPHAGOGASTRODUODENOSCOPY N/A 05/06/2015   Procedure: ESOPHAGOGASTRODUODENOSCOPY (EGD);  Surgeon: [name], MD;  Location: [facility] ENDOSCOPY;  Service: Endoscopy;  Laterality: N/A;  . EUS N/A 05/20/2015   Procedure: UPPER ENDOSCOPIC ULTRASOUND (EUS) LINEAR;  Surgeon: [name], MD;  [facility] ENDOSCOPY;  Service: Endoscopy;  Laterality: N/A;  . exploratory lab  1950s or 1960s  . GIVENS CAPSULE STUDY N/A 05/06/2015   Procedure: GIVENS CAPSULE STUDY;  Surgeon: [name], MD;  Location: [facility] ENDOSCOPY;  Service: Endoscopy;  Laterality: N/A;  . KNEE ARTHROSCOPY WITH MEDIAL MENISECTOMY Left 04/18/2018   Procedure: LEFT KNEE ARTHROSCOPY WITH PARTIAL MEDIAL MENISECTOMY AND ANTERIOR CRUCIATE LIGAMENT DEBRIDEMENT;  Surgeon: [name], MD;  Location: [facility] ORS;  Service: Orthopedics;  Laterality: Left;  . LEFT HEART CATH AND CORONARY ANGIOGRAPHY N/A 11/08/2016   Procedure: Left Heart Cath and Coronary Angiography;  Surgeon: [name], MD;   Location: [place] CV LAB;  Service: Cardiovascular;  Laterality: N/A;  . lumbar back surgery  2012  . [place] SURGERY  1991  . TONSILLECTOMY    . TONSILLECTOMY AND ADENOIDECTOMY  1960s    Family History  Problem Relation Age of Onset  . Stroke Mother   . Heart disease Mother   . Emphysema Father   . Ovarian cancer Sister   . Stroke Sister   . Other Child        died at birth    Allergies  Allergen Reactions  . Iodinated Diagnostic Agents Anaphylaxis and Other (See Comments)    IPD dye Info given by patient  . Ioxaglate Anaphylaxis and Other (See Comments)    Info given by patient  . Red Dye  Anaphylaxis  . Lactose Intolerance (Gi)   . Whey Other (See Comments)    Lactose intolerance  . Darvon [Propoxyphene] Rash  . Hydralazine Anxiety and Other (See Comments)    Facial flushing, pt prefers not to use it.   . Milk-Related Compounds Other (See Comments)    Lactose intolerance Can tolerate milk if its cooked into the recipe, just can't drink milk     Current Outpatient Medications on File Prior to Visit  Medication Sig Dispense Refill  . acetaminophen (TYLENOL) 500 MG tablet Take 500-1,000 mg by mouth daily as needed for mild pain or moderate pain.     [name] aspirin EC 81 MG tablet Take 1 tablet (81 mg total) by mouth daily. 90 tablet 3  . carvedilol (COREG) 25 MG tablet TAKE (1) TABLET BY MOUTH TWICE DAILY WITH A MEAL. 180 tablet 3  . ezetimibe-simvastatin (VYTORIN) 10-20 MG tablet Take 1 tablet by mouth daily. 30 tablet 6  . furosemide (LASIX) 40 MG tablet TAKE 1 TABLET BY MOUTH ONCE DAILY. 90 tablet 1  . ondansetron (ZOFRAN) 4 MG tablet Take 1 tablet (4 mg total) by mouth every 8 (eight) hours as needed for nausea or vomiting. 20 tablet 0  . oxyCODONE (OXY IR/ROXICODONE) 5 MG immediate release tablet Take 1 tablet (5 mg total) by mouth every 6 (six) hours as needed for breakthrough pain. 30 tablet 0  . pantoprazole (PROTONIX) 40 MG tablet TAKE 1 TABLET BY MOUTH TWICE  DAILY. 180 tablet 2  . Pediatric Multivitamins-Iron (FLINTSTONES PLUS IRON) chewable tablet Chew 2 tablets by mouth daily. 60 tablet 0  . potassium chloride (K-DUR) 10 MEQ tablet Take 1 tablet (10 mEq total) by mouth daily. 90 tablet 3  . traMADol (ULTRAM) 50 MG tablet Take 50 mg by mouth 3 (three) times daily.    [name] warfarin (COUMADIN) 5 MG tablet Take 1/2 tablet daily except 1 tablet on Sun Tues and Thurs or TAKE AS DIRECTED BY ANTICOAGULATION CLINIC 30 tablet 3   No current facility-administered medications on file prior to visit.     BP 138/76   Temp 98 F (36.7 C)   Wt 103 lb (46.7 kg)   BMI 18.84 kg/m       Objective:   Physical Exam Vitals signs and nursing note reviewed.  Constitutional:      Appearance: Normal appearance.  Cardiovascular:     Rate and Rhythm: Normal rate and regular rhythm.     Pulses: Normal pulses.     Heart sounds: Normal heart sounds.  Pulmonary:     Effort: Pulmonary effort is normal.     Breath sounds: Normal breath sounds.  Abdominal:     General: Abdomen is flat.     Palpations: Abdomen is soft.  Musculoskeletal:     Comments: Wearing bilateral thumb brace  Skin:    General: Skin is warm and dry.     Capillary Refill: Capillary refill takes less than 2 seconds.  Neurological:     General: No focal deficit present.     Mental Status: She is alert and oriented to person, place, and time.       Assessment & Plan:  1. Medication refill  - temazepam (RESTORIL) 15 MG capsule; Take 1 capsule (15 mg total) by mouth at bedtime for 30 days.  Dispense: 30 capsule; Refill: 1  2. Depression, recurrent ([place]) - We discussed depression at length. I would like her to get out of her house more often, go for walks,  and , and talk with friends . - She knows to call 911 or go to the ER with any thoughts of SI.  - We decided to increase paxil for 30 mg. Follow up in 30 days or sooner if neeed - PARoxetine (PAXIL) 30 MG tablet; Take 1 tablet (30 mg  total) by mouth daily.  Dispense: 90 tablet; Refill: 1  3. Anticoagulated on Coumadin  - POC INR  4. B12 deficiency  - cyanocobalamin ((VITAMIN B-12)) injection 1,000 mcg  Dorothyann Peng, NP

## 2018-08-02 NOTE — Patient Instructions (Signed)
It was great seeing you today   I have sent in your prescriptions and have increased Paxil from 20 mg to 30 mg   We will follow up with you regarding your INR   Please follow up in one month

## 2018-08-05 ENCOUNTER — Ambulatory Visit (INDEPENDENT_AMBULATORY_CARE_PROVIDER_SITE_OTHER): Payer: Medicare Other | Admitting: General Practice

## 2018-08-05 DIAGNOSIS — Z7901 Long term (current) use of anticoagulants: Secondary | ICD-10-CM

## 2018-08-05 DIAGNOSIS — I4891 Unspecified atrial fibrillation: Secondary | ICD-10-CM

## 2018-08-05 LAB — POCT INR: INR: 1.7 — AB (ref 2.0–3.0)

## 2018-08-05 NOTE — Patient Instructions (Addendum)
Pre visit review using our clinic review tool, if applicable. No additional management support is needed unless otherwise documented below in the visit note.  Take 1 tablet (5 mg) today (2/3) and then change dosage and take 1/2 tablet daily except 1 tablet on Sundays and Thursdays.  Re-check in 3 to 4 weeks.

## 2018-08-26 ENCOUNTER — Encounter (INDEPENDENT_AMBULATORY_CARE_PROVIDER_SITE_OTHER): Payer: 59 | Admitting: Ophthalmology

## 2018-09-02 ENCOUNTER — Ambulatory Visit (INDEPENDENT_AMBULATORY_CARE_PROVIDER_SITE_OTHER): Payer: Medicare Other | Admitting: General Practice

## 2018-09-02 DIAGNOSIS — I4891 Unspecified atrial fibrillation: Secondary | ICD-10-CM

## 2018-09-02 DIAGNOSIS — Z7901 Long term (current) use of anticoagulants: Secondary | ICD-10-CM | POA: Diagnosis not present

## 2018-09-02 LAB — POCT INR: INR: 2.6 (ref 2.0–3.0)

## 2018-09-02 NOTE — Patient Instructions (Addendum)
Pre visit review using our clinic review tool, if applicable. No additional management support is needed unless otherwise documented below in the visit note.  Continue to take 1/2 tablet daily except 1 tablet on Sundays and Thursdays.  Re-check in 3 to 4 weeks.

## 2018-09-10 ENCOUNTER — Other Ambulatory Visit: Payer: Self-pay | Admitting: Adult Health

## 2018-09-10 DIAGNOSIS — Z760 Encounter for issue of repeat prescription: Secondary | ICD-10-CM

## 2018-09-25 ENCOUNTER — Other Ambulatory Visit: Payer: Self-pay | Admitting: Internal Medicine

## 2018-09-27 ENCOUNTER — Telehealth: Payer: Self-pay | Admitting: General Practice

## 2018-09-27 NOTE — Telephone Encounter (Signed)
Patient cancelled appt, said she would call back and reschedule.  She stated she knows this isn't smart but if Jenny Reichmann told her that she still needed to come in that she may change her mind.

## 2018-09-30 ENCOUNTER — Ambulatory Visit: Payer: Medicare Other

## 2018-10-07 ENCOUNTER — Other Ambulatory Visit: Payer: Self-pay

## 2018-10-07 ENCOUNTER — Ambulatory Visit (INDEPENDENT_AMBULATORY_CARE_PROVIDER_SITE_OTHER): Payer: Medicare Other | Admitting: General Practice

## 2018-10-07 DIAGNOSIS — Z7901 Long term (current) use of anticoagulants: Secondary | ICD-10-CM | POA: Diagnosis not present

## 2018-10-07 DIAGNOSIS — I4891 Unspecified atrial fibrillation: Secondary | ICD-10-CM

## 2018-10-07 LAB — POCT INR: INR: 2.4 (ref 2.0–3.0)

## 2018-10-07 NOTE — Patient Instructions (Addendum)
Pre visit review using our clinic review tool, if applicable. No additional management support is needed unless otherwise documented below in the visit note.  Continue to  take 1/2 tablet daily except 1 tablet on Sundays and Thursdays.  Re-check in 6 weeks.  

## 2018-11-13 ENCOUNTER — Other Ambulatory Visit: Payer: Self-pay | Admitting: Adult Health

## 2018-11-13 DIAGNOSIS — Z760 Encounter for issue of repeat prescription: Secondary | ICD-10-CM

## 2018-11-18 ENCOUNTER — Ambulatory Visit (INDEPENDENT_AMBULATORY_CARE_PROVIDER_SITE_OTHER): Payer: Medicare Other | Admitting: Family Medicine

## 2018-11-18 ENCOUNTER — Encounter: Payer: Self-pay | Admitting: Family Medicine

## 2018-11-18 ENCOUNTER — Other Ambulatory Visit: Payer: Self-pay

## 2018-11-18 ENCOUNTER — Ambulatory Visit (INDEPENDENT_AMBULATORY_CARE_PROVIDER_SITE_OTHER): Payer: Medicare Other | Admitting: General Practice

## 2018-11-18 VITALS — BP 108/58 | HR 65 | Temp 98.0°F | Ht 62.0 in | Wt 113.0 lb

## 2018-11-18 DIAGNOSIS — R21 Rash and other nonspecific skin eruption: Secondary | ICD-10-CM | POA: Diagnosis not present

## 2018-11-18 DIAGNOSIS — E538 Deficiency of other specified B group vitamins: Secondary | ICD-10-CM | POA: Diagnosis not present

## 2018-11-18 DIAGNOSIS — Z7901 Long term (current) use of anticoagulants: Secondary | ICD-10-CM | POA: Diagnosis not present

## 2018-11-18 DIAGNOSIS — I4891 Unspecified atrial fibrillation: Secondary | ICD-10-CM

## 2018-11-18 LAB — POCT INR: INR: 2.7 (ref 2.0–3.0)

## 2018-11-18 MED ORDER — CYANOCOBALAMIN 1000 MCG/ML IJ SOLN
1000.0000 ug | Freq: Once | INTRAMUSCULAR | Status: AC
  Administered 2018-11-18: 1000 ug via INTRAMUSCULAR

## 2018-11-18 MED ORDER — TRIAMCINOLONE ACETONIDE 0.1 % EX CREA: 1.0000 "application " | TOPICAL_CREAM | Freq: Two times a day (BID) | CUTANEOUS | 0 refills | Status: DC

## 2018-11-18 NOTE — Progress Notes (Signed)
Subjective:     Patient ID: [name], female   DOB: 12/26/1940, 78 y.o.   MRN: [id number]  HPI Patient is seen with some recent mild facial irritation.  She states last Friday she wore her mask which was 100% cotton out to get groceries.  When she returned she had some mild swelling and redness and itching.  After leaving the mask off much of the weekend her symptoms are improved.  She does not notice any scaling.  No pustules.  No papules.  No history of similar rash. Denies any systemic rash  Patient has B12 deficiency and requesting injection today  Past Medical History:  Diagnosis Date  . Anemia 2005   Generally microcytic, transfusions in [address], 2012, 02/2015, 05/2015  . Aortic stenosis    Moderate November 2017  . Arthritis   . Broken back 2013   Chronic back pain.   [name] CAD (coronary artery disease)    Cardiac catheterization 2014 - 80% mid RCA and 70% OM managed medically  . CHF (congestive heart failure) ([place])   . Chronic bilateral pleural effusions   . Chronic headache   . Contrast media allergy   . Diastolic heart failure ([place])   . Esophageal cancer ([place]) 05/06/2015   Adenocarcinoma GE junction  . Facial paresthesia   . GERD (gastroesophageal reflux disease)   . H. pylori infection   . H/O iron deficiency    05-06-15 iron infusion ([facility])  . HH (hiatus hernia) 2008   Large with associated erosions  . Hypertension   . Paroxysmal atrial fibrillation ([name])   . PONV (postoperative nausea and vomiting)   . Pulmonary emboli ([place]) 2008, 2012  . Stroke [facility]) 1982   Past Surgical History:  Procedure Laterality Date  . APPENDECTOMY  1950s  . BACK SURGERY    . CARPAL TUNNEL RELEASE Bilateral 1990s  . [place] SURGERY  2014  . CHOLECYSTECTOMY  1980s   open  . COLONOSCOPY N/A 02/17/2015   Procedure: COLONOSCOPY;  Surgeon: [name], MD;  [facility] ENDOS[facility];  Service: Endoscopy;  Laterality: N/A;  . ESOPHAGOGASTRODUODENOSCOPY N/A 02/16/2015   Procedure:  ESOPHAGOGASTRODUODENOSCOPY (EGD);  Surgeon: [name], MD;  Location: [facility];  Service: Endoscopy;  Laterality: N/A;  . ESOPHAGOGASTRODUODENOSCOPY N/A 05/06/2015   Procedure: ESOPHAGOGASTRODUODENOSCOPY (EGD);  Surgeon: [name], MD;  Location: [facility] ENDOSCOPY;  Service: Endoscopy;  Laterality: N/A;  . EUS N/A 05/20/2015   Procedure: UPPER ENDOSCOPIC ULTRASOUND (EUS) LINEAR;  Surgeon: [name], MD;  [facility] ENDOSCOPY;  Service: Endoscopy;  Laterality: N/A;  . exploratory lab  1950s or 1960s  . GIVENS CAPSULE STUDY N/A 05/06/2015   Procedure: GIVENS CAPSULE STUDY;  Surgeon: [name], MD;  Location: [facility] ENDOSCOPY;  Service: Endoscopy;  Laterality: N/A;  . KNEE ARTHROSCOPY WITH MEDIAL MENISECTOMY Left 04/18/2018   Procedure: LEFT KNEE ARTHROSCOPY WITH PARTIAL MEDIAL MENISECTOMY AND ANTERIOR CRUCIATE LIGAMENT DEBRIDEMENT;  Surgeon: [name], MD;  Location[facility];  Service: Orthopedics;  Laterality: Left;  . LEFT HEART CATH AND CORONARY ANGIOGRAPHY N/A 11/08/2016   Procedure: Left Heart Cath and Coronary Angiography;  Surgeon: [name], MD;  Location: [place] CV LAB;  Service: Cardiovascular;  Laterality: N/A;  . lumbar back surgery  2012  . [place] SURGERY  1991  . TONSILLECTOMY    . TONSILLECTOMY AND ADENOIDECTOMY  1960s    reports that she has never smoked. She has never used smokeless tobacco. She reports that she  does not drink alcohol or use drugs. family history includes Emphysema in her father; Heart disease in her mother; Other in her child; Ovarian cancer in her sister; Stroke in her mother and sister. Allergies  Allergen Reactions  . Iodinated Diagnostic Agents Anaphylaxis and Other (See Comments)    IPD dye Info given by patient  . Ioxaglate Anaphylaxis and Other (See Comments)    Info given by patient  . Red Dye Anaphylaxis  . Lactose Intolerance (Gi)   . Whey Other (See Comments)    Lactose intolerance  . Darvon [Propoxyphene]  Rash  . Hydralazine Anxiety and Other (See Comments)    Facial flushing, pt prefers not to use it.   . Milk-Related Compounds Other (See Comments)    Lactose intolerance Can tolerate milk if its cooked into the recipe, just can't drink milk      Review of Systems  Constitutional: Negative for chills and fever.  Respiratory: Negative for cough and shortness of breath.   Skin: Positive for rash.       Objective:   Physical Exam Constitutional:      Appearance: Normal appearance.  Cardiovascular:     Rate and Rhythm: Normal rate.  Pulmonary:     Effort: Pulmonary effort is normal.     Breath sounds: Normal breath sounds.  Skin:    Comments: Patient has pink skin cheeks bilaterally.  [place]ons[place]y.  No pustules.  No papules.  Neurological:     Mental Status: She is alert.        Assessment:     Recent facial skin rash.  Possibly related to irritation from mask.    Plan:     -Leave off mask as much as possible -Triamcinolone 0.1% cream to use twice daily as needed -B12 injection given  Eulas Post MD [facility] at [facility]

## 2018-11-18 NOTE — Addendum Note (Signed)
Addended by: Anibal Henderson on: 11/18/2018 01:36 PM   Modules accepted: Orders

## 2018-11-18 NOTE — Patient Instructions (Signed)
Try to leave the mask off as much as possible.  May use the Triamcinolone cream as needed for any itching.

## 2018-11-18 NOTE — Patient Instructions (Addendum)
Pre visit review using our clinic review tool, if applicable. No additional management support is needed unless otherwise documented below in the visit note.  Continue to  take 1/2 tablet daily except 1 tablet on Sundays and Thursdays.  Re-check in 6 weeks.  

## 2018-11-21 ENCOUNTER — Other Ambulatory Visit: Payer: Self-pay | Admitting: Adult Health

## 2018-11-26 NOTE — Telephone Encounter (Signed)
Sent to the pharmacy by e-scribe. 

## 2018-11-26 NOTE — Telephone Encounter (Signed)
90 days, needs CPE

## 2018-12-11 ENCOUNTER — Other Ambulatory Visit: Payer: Self-pay | Admitting: Adult Health

## 2018-12-11 DIAGNOSIS — Z760 Encounter for issue of repeat prescription: Secondary | ICD-10-CM

## 2018-12-25 ENCOUNTER — Ambulatory Visit (INDEPENDENT_AMBULATORY_CARE_PROVIDER_SITE_OTHER): Payer: Medicare Other | Admitting: Internal Medicine

## 2018-12-25 ENCOUNTER — Other Ambulatory Visit: Payer: Self-pay

## 2018-12-25 ENCOUNTER — Encounter: Payer: Self-pay | Admitting: Internal Medicine

## 2018-12-25 VITALS — BP 150/60 | HR 74 | Temp 97.2°F | Resp 12 | Ht 62.0 in | Wt 119.2 lb

## 2018-12-25 DIAGNOSIS — E782 Mixed hyperlipidemia: Secondary | ICD-10-CM

## 2018-12-25 DIAGNOSIS — I10 Essential (primary) hypertension: Secondary | ICD-10-CM

## 2018-12-25 DIAGNOSIS — I480 Paroxysmal atrial fibrillation: Secondary | ICD-10-CM | POA: Diagnosis not present

## 2018-12-25 DIAGNOSIS — I350 Nonrheumatic aortic (valve) stenosis: Secondary | ICD-10-CM

## 2018-12-25 MED ORDER — AMLODIPINE BESYLATE 5 MG PO TABS: 5.0000 mg | ORAL_TABLET | Freq: Every day | ORAL | 3 refills | Status: DC

## 2018-12-25 NOTE — Progress Notes (Signed)
OFFICE NOTE  Chief Complaint:  Chronic pain, multiple complaints  Primary Care Physician: Dorothyann Peng, NP  HPI:  [name] is a pleasant 78 year old female kindly referred to me be Dr. Ardeth Perfect. She reports a past medical history significant for atrial fibrillation and congestive heart failure, which occurred around the same time and probably 2012. She vaguely recalls seeing Dr. Irish Lack at that time.  She was started on warfarin therapy and no attempts were made to get her back into rhythm as she was paroxysmal. She does has a long-standing history of hypertension and dyslipidemia. She apparently developed heart failure or however has never had reassessment of her ejection fraction. She subsequently was having health issues and decided to move with her son to live in [place]. She says at that time she had some problems with chest pain and ultimately underwent a stress test. This was associated with an anaphylactic type reaction and despite their reassurances she will "no longer have any more stress test". She says she has a severe allergy to IV contrast dye which causes throat swelling as well as milk which also causes the same symptoms. Apparently she also underwent cardiac catheterization however she says that only medical therapy was recommended. This was in [place], [place].  We are trying to request records from there as well. She subsequently moved with her son to [facility], however he lost his job and she recently moved back to [place]. Currently she denies any significant shortness of breath but does report some mild leg edema which is slightly worse and occasional pain in her left chest and left arm over the last several days. She's also had pain in the right chest. None of those symptoms are worse with exertion or relieved by rest or medication.   [name] returns today for followup. She again appears upset and quite nervous. Blood pressure recently has been significantly  elevated. In fact she went to the emergency room and was hospitalized for blood pressure control. Although it seems that her blood pressure more easily was controlled once her oral medicines were restarted. Subsequently she's been started on clonidine is now up to twice a day. Blood pressure today was still high at 741 systolic. She reports some occasional chest discomfort, mostly at rest and reports not being active enough to know if she has symptoms with exertion. She also has pain all over body significant low back pain and is seeing a specialist about that in the next few weeks.  I saw [name] back in the office today. She reports some improvement in her chest discomfort and I think this is related to blood pressure. The recent switch in her blood pressure medicines indicate a marked improvement in her pressures in she is noted to be 130/66 today. She does get some slight shortness of breath with exertion and no regular chest discomfort. At this time there is not clear evidence to pursue a further workup although I have a low threshold for further cardiac workup if she should become more symptomatic. She is in sinus rhythm and seems to be maintaining that.  [name] returns today for follow-up. She's had a remarkable past 6 months. Unfortunately she was diagnosed with a gastric cancer which was considered very high risk to resect. She was referred to [facility] for an endoscopic resection which was successful with clean margins. Prior to that she underwent cardiac evaluation by [facility] cardiology including a nuclear stress test which was low risk and  didn't involve exercise. She reports that during the last minute of exercise she went into A. fib. She's had recurrent A. fib on and off and in fact was just hospitalized again for A. fib a few weeks ago which converted spontaneously back to sinus rhythm, prior to cardiology is evaluating her. She was then directed to follow-up with me. She has  fortunately been on warfarin with therapeutic INRs. Of note, I did review her cardiac catheterization which was performed at an outside hospital 2014 which did demonstrate an 80% mid RCA stenosis as well as a 70% OM lesion and some mild to moderate disease of other vessels. At the time no stent was recommended, which leads me to believe these may be smaller vessels. The fact that her most recent stress test was again negative suggest that they are not flow limiting even though they would seem significant. Also, today she is describing left hand pain. She says that there is exquisite tenderness the left hand and it's difficult for her to extend her finger. She often notes that her left middle finger gets stuck. On palpation I do not notice any significant arthritis of the joints or nodules on the tendons.  04/19/2016  [name] returns today for follow-up. She is concerned about shortness of breath which is been progressive. She reports recently she's had some loss of vocal power and noted some upper airway wheezing. We and related with her on the office today and her oxygen saturations were noted to be 97% at rest and that came down to 95% with ambulation. EKG shows sinus rhythm at 66. Blood pressure is mildly elevated today. She has a nonproductive cough but denies fever, chills or sweats. Weight is up to 141 from 130 pounds 6 months ago.  07/11/2015  [name] returns today for follow-up. She seems to be doing fairly well. She denies any worsening shortness of breath or chest pain. She seems to be suffering from some acid reflux and will likely be undergoing another EGD with Dr. Benson Norway. She continues to have problems with voice changes which could be due to vocal cord inflammation related to her reflux.  09/21/2016  [name] is here today for follow-up. She had an INR check in the office. She is scheduled to have upcoming surgery for large hiatal hernia. Warm to [facility] by Dr. Precious Bard. Risk  factors for surgery including aortic stenosis, however this is recently assessed and is moderate. She denies any chest pain or anginal symptoms.  11/02/2016  Mrs. Tunnell returns for follow-up today. Recently she's been having uncontrolled hypertension. 5 blood pressures been over 350 systolic. She's not had a history with difficult to control hypertension in the past. He's also had associated chest discomfort. The other day she had severe chest discomfort and she has had headaches. In 2013 she underwent cardia catheterization in [place] which showed some moderate coronary disease including what appears to be an 80% stenosis of the RCA however was not treated at the time. She also had proximal LAD disease which was mild to moderate. I'm concerned that she's had progression of her coronary artery disease as a possible cause of her uncontrolled hypertension. She reports the pain has occurred at rest but can occur with exertion. She also gets short of breath with exertion. She's felt more fatigue as well. The pain is described as sharp and nonradiating in the central chest. Unfortunate she's on warfarin for PAF and will need to hold that for 5 days prior  to the procedure to safely undergo cardiac catheterization. Although he recently cleared her for hernia repair, she never underwent the surgery as she was felt to be at higher risk. Given this new information I would not recommend hernia surgery in the near future until we have a better idea of her coronary arteries.  11/16/2016  Mrs. Plath returns for follow-up. She underwent heart catheterization by Dr. Ellyn Hack on 11/08/2016 which showed hyperdynamic LVEF greater than 65%, there was a moderate 60% ramus lesion and 60% RPDA lesion which was not thought to be significant. There was mild aortic valvular stenosis. Blood pressure was elevated. Medical therapy including increased blood pressure control was recommended. Since discharge she has done well. She denied any  competitions from the right femoral access site. She's had one or 2 brief episodes of chest discomfort. Unfortunately after I saw her in the office at the last visit she fell in the parking lot by the [employer] caf[employer]ia. Ultimately she fractured her right radial styloid. That is now casted and in a sling. She does not anticipate the need for surgery, but she would be considered acceptable risk for surgery. Blood pressure is improved however not yet at goal.  02/19/2017  Mrs. Hannay is seen today in follow-up. She is doing well other than painful osteoarthritis. Her blood pressures been better controlled although she is in need of refills. She was found to have moderate aortic stenosis by last echo however was only noted to be mild by cardiac catheterization. She'll need a repeat echo in about 6 months.  09/10/2016  Mrs. Tun returns today for follow-up.  She is under a lot of stress today.  Her sister is in the hospital and had a repeat heart surgery.  Mrs. Karan his blood pressure is much better controlled today 114/60.  Overall she feels well and denies chest pain or worsening shortness of breath.  As noted she did have moderate coronary artery disease and even some severe distal coronary disease managed medically by heart cath in 2014.  Recently she had a lipid profile last week showing total cholesterol 182, triglycerides 109, HDL 54, LDL 107.  She is on simvastatin 20 mg.  She does have moderate aortic stenosis by echo in 05/2016.  03/20/2018  Mrs. Plourde is seen today in follow-up.  She has multiple complaints today including significant chest and back pain, weakness, fatigue.  Work-up recently including hospitalization demonstrated some acute heart failure with LVEF 50 to 55%.  She has permanent A. fib at this point and is not likely that that is the main cause of her weakness.  She was diuresed and is also noted to have mild to moderate aortic stenosis with a mean gradient of 14 mmHg in August.   Again I do not think this is likely the cause of her multiple somatic complaints.  She does have diffuse arthritis and neuropathy and is on medication for that.  She may very well need pain management.  12/25/2018  Ms. Bryand is seen today in follow-up.  She is done fairly well over the past 6 months.  Her echo last year showed stable LVEF 50 to 55% with mild to moderate aortic stenosis.  She still has complaints of generalized pain.  She denies any congestive heart failure symptoms.  She does note that she goes in and out of A. fib however is not worsened with her symptoms regarding that.  Her INRs have been therapeutic on warfarin.  She is overdue for lipid  profile.  Blood pressure also has been poorly controlled at home in general 937-169 systolic.  PMHx:  Past Medical History:  Diagnosis Date   Anemia 2005   Generally microcytic, transfusions in [address], 2012, 02/2015, 05/2015   Aortic stenosis    Moderate November 2017   Arthritis    Broken back 2013   Chronic back pain.    CAD (coronary artery disease)    Cardiac catheterization 2014 - 80% mid RCA and 70% OM managed medically   CHF (congestive heart failure) ([name])    Chronic bilateral pleural effusions    Chronic headache    Contrast media allergy    Diastolic heart failure ([name])    Esophageal cancer ([name]) 05/06/2015   Adenocarcinoma GE junction   Facial paresthesia    GERD (gastroesophageal reflux disease)    H. pylori infection    H/O iron deficiency    05-06-15 iron infusion ([facility])   HH (hiatus hernia) 2008   Large with associated erosions   Hypertension    Paroxysmal atrial fibrillation ([name])    PONV (postoperative nausea and vomiting)    Pulmonary emboli ([place]) 2008, 2012   Stroke [facility]) 1982    Past Surgical History:  Procedure Laterality Date   APPENDECTOMY  1950s   BACK SURGERY     CARPAL TUNNEL RELEASE Bilateral 1990s   CERVICAL [place] SURGERY  2014   CHOLECYSTECTOMY  1980s   open    COLONOSCOPY N/A 02/17/2015   Procedure: COLONOSCOPY;  Surgeon: [name], MD;  Location: [facility] ENDOSCOPY;  Service: Endoscopy;  Laterality: N/A;   ESOPHAGOGASTRODUODENOSCOPY N/A 02/16/2015   Procedure: ESOPHAGOGASTRODUODENOSCOPY (EGD);  Surgeon: [name], MD;  Location: [facility];  Service: Endoscopy;  Laterality: N/A;   ESOPHAGOGASTRODUODENOSCOPY N/A 05/06/2015   Procedure: ESOPHAGOGASTRODUODENOSCOPY (EGD);  Surgeon: [name], MD;  Location: [facility] ENDOSCOPY;  Service: Endoscopy;  Laterality: N/A;   EUS N/A 05/20/2015   Procedure: UPPER ENDOSCOPIC ULTRASOUND (EUS) LINEAR;  Surgeon: [name], MD;  Location: [facility] ENDOSCOPY;  Service: Endoscopy;  Laterality: N/A;   exploratory lab  1950s or [place] N/A 05/06/2015   Procedure: GIVENS CAPSULE STUDY;  Surgeon: [name], MD;  Location: [facility] ENDOSCOPY;  Service: Endoscopy;  Laterality: N/A;   KNEE ARTHROSCOPY WITH MEDIAL MENISECTOMY Left 04/18/2018   Procedure: LEFT KNEE ARTHROSCOPY WITH PARTIAL MEDIAL MENISECTOMY AND ANTERIOR CRUCIATE LIGAMENT DEBRIDEMENT;  Surgeon: [name], MD;  Location: [facility] ORS;  Service: Orthopedics;  Laterality: Left;   LEFT HEART CATH AND CORONARY ANGIOGRAPHY N/A 11/08/2016   Procedure: Left Heart Cath and Coronary Angiography;  Surgeon: [name], MD;  Location: [facility] CV LAB;  Service: Cardiovascular;  Laterality: N/A;   lumbar back surgery  2012   LUMBAR DISC SURGERY  1991   TONSILLECTOMY     TONSILLECTOMY AND ADENOIDECTOMY  1960s    FAMHx:  Family History  Problem Relation Age of Onset   Stroke Mother    Heart disease Mother    Emphysema Father    Ovarian cancer Sister    Stroke Sister    Other Child        died at birth    SOCHx:   reports that she has never smoked. She has never used smokeless tobacco. She reports that she does not drink alcohol or use drugs.  ALLERGIES:  Allergies  Allergen Reactions   Iodinated Diagnostic Agents  Anaphylaxis and Other (See Comments)    IPD dye Info given by  patient   Ioxaglate Anaphylaxis and Other (See Comments)    Info given by patient   Red Dye Anaphylaxis   Lactose Intolerance (Gi)    Whey Other (See Comments)    Lactose intolerance   Darvon [Propoxyphene] Rash   Hydralazine Anxiety and Other (See Comments)    Facial flushing, pt prefers not to use it.    Milk-Related Compounds Other (See Comments)    Lactose intolerance Can tolerate milk if its cooked into the recipe, just can't drink milk     ROS: Pertinent items noted in HPI and remainder of comprehensive ROS otherwise negative.  HOME MEDS: Current Outpatient Medications  Medication Sig Dispense Refill   acetaminophen (TYLENOL) 500 MG tablet Take 500-1,000 mg by mouth daily as needed for mild pain or moderate pain.      aspirin EC 81 MG tablet Take 1 tablet (81 mg total) by mouth daily. 90 tablet 3   carvedilol (COREG) 25 MG tablet TAKE (1) TABLET BY MOUTH TWICE DAILY WITH A MEAL. 180 tablet 3   ezetimibe-simvastatin (VYTORIN) 10-20 MG tablet Take 1 tablet by mouth daily. SCHEDULE OV FOR FURTHER REFILLS. 90 tablet 0   furosemide (LASIX) 40 MG tablet TAKE 1 TABLET BY MOUTH ONCE DAILY. 90 tablet 1   meclizine (ANTIVERT) 25 MG tablet Take 1 tablet (25 mg total) by mouth as needed for dizziness or nausea. 90 tablet 1   ondansetron (ZOFRAN) 4 MG tablet Take 1 tablet (4 mg total) by mouth every 8 (eight) hours as needed for nausea or vomiting. 20 tablet 0   oxyCODONE (OXY IR/ROXICODONE) 5 MG immediate release tablet Take 1 tablet (5 mg total) by mouth every 6 (six) hours as needed for breakthrough pain. 30 tablet 0   pantoprazole (PROTONIX) 40 MG tablet TAKE 1 TABLET BY MOUTH TWICE DAILY. 180 tablet 0   Pediatric Multivitamins-Iron (FLINTSTONES PLUS IRON) chewable tablet Chew 2 tablets by mouth daily. 60 tablet 0   potassium chloride (K-DUR) 10 MEQ tablet Take 1 tablet (10 mEq total) by mouth daily. 90  tablet 3   temazepam (RESTORIL) 15 MG capsule TAKE (1) CAPSULE BY MOUTH AT BEDTIME. 30 capsule 2   traMADol (ULTRAM) 50 MG tablet Take 50 mg by mouth 3 (three) times daily.     triamcinolone cream (KENALOG) 0.1 % Apply 1 application topically 2 (two) times daily. 30 g 0   warfarin (COUMADIN) 5 MG tablet Take 1/2 tablet daily except 1 tablet on Sun Tues and Thurs or TAKE AS DIRECTED BY ANTICOAGULATION CLINIC 30 tablet 3   PARoxetine (PAXIL) 30 MG tablet Take 1 tablet (30 mg total) by mouth daily. 90 tablet 1   No current facility-administered medications for this visit.     LABS/IMAGING: No results found for this or any previous visit (from the past 48 hour(s)). No results found.  VITALS: BP (!) 150/60    Pulse 74    Temp (!) 97.2 F (36.2 C)    Resp 12    Ht 5\' 2"  (1.575 m)    Wt 119 lb 3.2 oz (54.1 kg)    SpO2 97%    BMI 21.80 kg/m   EXAM: General appearance: alert and no distress Neck: no carotid bruit and no JVD Lungs: clear to auscultation bilaterally Heart: regular rate and rhythm, S1, S2 normal and systolic murmur: midsystolic 3/6, crescendo at 2nd right intercostal space Abdomen: soft, non-tender; bowel sounds normal; no masses,  no organomegaly Extremities: normal Pulses: 2+ and symmetric Skin: Skin color,  texture, turgor normal. No rashes or lesions Neurologic: Grossly normal Psych: Pleasant  EKG: Normal sinus rhythm at 70-personally reviewed  ASSESSMENT: 1. History of unstable angina - cath showed two-vessel moderate CAD and mild aortic stenosis (10/2016) 2. Uncontrolled hypertension 3. Paroxysmal atrial fibrillation on warfarin 4. History of diastolic congestive heart failure - EF 50-55% on echo 5. Dyslipidemia 6. Moderate aortic stenosis 7. Right hand pain with contracture 8. Recent GI cancer/resected  PLAN: 1.   Mrs. Gramajo overall seems to be doing well.  Her blood pressure remains uncontrolled.  I like to add amlodipine 5 mg daily to her current  regimen.  We will monitor her BPs at home.  Her LVEF was recently slightly lower at 50 to 55%, but she denies any heart failure symptoms.  INR has been therapeutic on warfarin and she still has PAF although is in sinus rhythm today.  Is unclear if she is symptomatic with that.  We will need to continue to follow her aortic stenosis by echo.  Will obtain a fasting lipid profile adjust medications accordingly to goal LDL less than 70.  Plan to see her back in 6 months or sooner if necessary.  Pixie Casino, MD, [facility], [place] Director of the Advanced Lipid Disorders &  Cardiovascular Risk Reduction Clinic Diplomate of the American Board of Clinical Lipidology Attending Cardiologist  Direct Dial: [phone]   Fax: [phone]  Website:  [email or web address] 12/25/2018, 9:34 AM

## 2018-12-25 NOTE — Patient Instructions (Signed)
Medication Instructions:  START amlodipine 5mg  daily Continue other medications  If you need a refill on your cardiac medications before your next appointment, please call your pharmacy.   Lab work: FASTING lab work to check cholesterol If you have labs (blood work) drawn today and your tests are completely normal, you will receive your results only by: [name] MyChart Message (if you have MyChart) OR . A paper copy in the mail If you have any lab test that is abnormal or we need to change your treatment, we will call you to review the results.  Testing/Procedures: NONE  Follow-Up: At [facility], you and your health needs are our priority.  As part of our continuing mission to provide you with exceptional heart care, we have created designated Provider Care Teams.  These Care Teams include your primary Cardiologist (physician) and Advanced Practice Providers (APPs -  Physician Assistants and Nurse Practitioners) who all work together to provide you with the care you need, when you need it. You will need a follow up appointment in 6 months.  Please call our office 2 months in advance to schedule this appointment.  You may see Pixie Casino, MD or one of the following Advanced Practice Providers on your designated Care Team: [place], [place] . Fabian Sharp, PA-C  Any Other Special Instructions Will Be Listed Below (If Applicable).  Dr. Debara Pickett has suggested that you monitor your BP at home and send readings via MyChart or call with those in about 2 weeks.  HOW TO TAKE YOUR BLOOD PRESSURE:  Rest 5 minutes before taking your blood pressure.  Don't smoke or drink caffeinated beverages for at least 30 minutes before.  Take your blood pressure before (not after) you eat.  Sit comfortably with your back supported and both feet on the floor (don't cross your legs).  Elevate your arm to heart level on a table or a desk.  Use the proper sized cuff. It should fit smoothly and snugly around your  bare upper arm. There should be enough room to slip a fingertip under the cuff. The bottom edge of the cuff should be 1 inch above the crease of the elbow.  Ideally, take 3 measurements at one sitting and record the average.

## 2018-12-27 ENCOUNTER — Telehealth: Payer: Self-pay | Admitting: Adult Health

## 2018-12-27 ENCOUNTER — Telehealth: Payer: Self-pay | Admitting: *Deleted

## 2018-12-27 NOTE — Telephone Encounter (Signed)
Copied from [place] [id number]. Topic: Quick Communication - See Telephone Encounter >> Dec 26, 2018  5:24 PM Loma Boston wrote: CRM for notification. See Telephone encounter for: 12/26/18. Pls follow up w/th pt in am [phone] about sch for lab work / in request but this is after hrs

## 2018-12-27 NOTE — Telephone Encounter (Signed)
Patient called to see if she could get her labs scheduled while there on Monday for Coumadin clinic thank you

## 2018-12-27 NOTE — Telephone Encounter (Signed)
Called patient back. Lab appointment has been scheduled. Nothing further needed.

## 2018-12-30 ENCOUNTER — Other Ambulatory Visit: Payer: Self-pay

## 2018-12-30 ENCOUNTER — Ambulatory Visit (INDEPENDENT_AMBULATORY_CARE_PROVIDER_SITE_OTHER): Payer: Medicare Other | Admitting: General Practice

## 2018-12-30 ENCOUNTER — Other Ambulatory Visit: Payer: Self-pay | Admitting: General Practice

## 2018-12-30 ENCOUNTER — Other Ambulatory Visit (INDEPENDENT_AMBULATORY_CARE_PROVIDER_SITE_OTHER): Payer: Medicare Other

## 2018-12-30 DIAGNOSIS — C160 Malignant neoplasm of cardia: Secondary | ICD-10-CM | POA: Diagnosis not present

## 2018-12-30 DIAGNOSIS — E538 Deficiency of other specified B group vitamins: Secondary | ICD-10-CM

## 2018-12-30 DIAGNOSIS — D509 Iron deficiency anemia, unspecified: Secondary | ICD-10-CM

## 2018-12-30 DIAGNOSIS — Z7189 Other specified counseling: Secondary | ICD-10-CM

## 2018-12-30 DIAGNOSIS — Z7901 Long term (current) use of anticoagulants: Secondary | ICD-10-CM

## 2018-12-30 LAB — CBC WITH DIFFERENTIAL/PLATELET
Basophils Absolute: 0 10*3/uL (ref 0.0–0.1)
Basophils Relative: 0.4 % (ref 0.0–3.0)
Eosinophils Absolute: 0.1 10*3/uL (ref 0.0–0.7)
Eosinophils Relative: 2.4 % (ref 0.0–5.0)
HCT: 37.8 % (ref 36.0–46.0)
Hemoglobin: 13 g/dL (ref 12.0–15.0)
Lymphocytes Relative: 25.1 % (ref 12.0–46.0)
Lymphs Abs: 1.3 10*3/uL (ref 0.7–4.0)
MCHC: 34.3 g/dL (ref 30.0–36.0)
MCV: 87.1 fl (ref 78.0–100.0)
Monocytes Absolute: 0.4 10*3/uL (ref 0.1–1.0)
Monocytes Relative: 8.4 % (ref 3.0–12.0)
Neutro Abs: 3.3 10*3/uL (ref 1.4–7.7)
Neutrophils Relative %: 63.7 % (ref 43.0–77.0)
Platelets: 146 10*3/uL — ABNORMAL LOW (ref 150.0–400.0)
RBC: 4.34 Mil/uL (ref 3.87–5.11)
RDW: 13.6 % (ref 11.5–15.5)
WBC: 5.2 10*3/uL (ref 4.0–10.5)

## 2018-12-30 LAB — POCT INR: INR: 2.1 (ref 2.0–3.0)

## 2018-12-30 MED ORDER — CYANOCOBALAMIN 1000 MCG/ML IJ SOLN: 1000.0000 ug | Freq: Once | INTRAMUSCULAR | Status: DC

## 2018-12-30 MED ORDER — CYANOCOBALAMIN 1000 MCG/ML IJ SOLN
1000.0000 ug | Freq: Once | INTRAMUSCULAR | Status: AC
  Administered 2018-12-30: 1000 ug via INTRAMUSCULAR

## 2018-12-30 NOTE — Patient Instructions (Signed)
Pre visit review using our clinic review tool, if applicable. No additional management support is needed unless otherwise documented below in the visit note.  Continue to  take 1/2 tablet daily except 1 tablet on Sundays and Thursdays.  Re-check in 6 weeks.  

## 2018-12-31 NOTE — Telephone Encounter (Signed)
Pt notified of normal lab work.  See result note.

## 2019-01-06 ENCOUNTER — Telehealth: Payer: Self-pay | Admitting: Orthopedic Surgery

## 2019-01-06 ENCOUNTER — Other Ambulatory Visit: Payer: Self-pay | Admitting: Internal Medicine

## 2019-01-06 NOTE — Telephone Encounter (Signed)
Patient called back; she had a billing question. Will also call [facility] billing. Given contact number.

## 2019-01-06 NOTE — Telephone Encounter (Signed)
Call received from patient via voice message. Called back in response - reached voice mail; left message to return call.

## 2019-01-07 NOTE — Progress Notes (Signed)
Agree with coumadin management.  [name] W Bon Dowis MD [facility] [place]  

## 2019-01-13 ENCOUNTER — Other Ambulatory Visit: Payer: Self-pay | Admitting: Adult Health

## 2019-02-10 ENCOUNTER — Other Ambulatory Visit: Payer: Self-pay

## 2019-02-10 ENCOUNTER — Ambulatory Visit (INDEPENDENT_AMBULATORY_CARE_PROVIDER_SITE_OTHER): Payer: Medicare Other | Admitting: General Practice

## 2019-02-10 DIAGNOSIS — E538 Deficiency of other specified B group vitamins: Secondary | ICD-10-CM

## 2019-02-10 DIAGNOSIS — I4891 Unspecified atrial fibrillation: Secondary | ICD-10-CM | POA: Diagnosis not present

## 2019-02-10 DIAGNOSIS — Z7901 Long term (current) use of anticoagulants: Secondary | ICD-10-CM | POA: Diagnosis not present

## 2019-02-10 LAB — POCT INR: INR: 1.8 — AB (ref 2.0–3.0)

## 2019-02-10 MED ORDER — CYANOCOBALAMIN 1000 MCG/ML IJ SOLN
1000.0000 ug | Freq: Once | INTRAMUSCULAR | Status: AC
  Administered 2019-02-10: 1000 ug via INTRAMUSCULAR

## 2019-02-10 NOTE — Patient Instructions (Addendum)
Pre visit review using our clinic review tool, if applicable. No additional management support is needed unless otherwise documented below in the visit note.  Continue to take 1/2 tablet daily except 1 tablet on Sundays and Thursdays.  Re-check in 4 weeks.

## 2019-02-13 NOTE — Progress Notes (Signed)
Agree with B12 injection and coumadin management as per Villa Herb, RN  Eulas Post MD [facility]

## 2019-02-15 ENCOUNTER — Other Ambulatory Visit: Payer: Self-pay | Admitting: Internal Medicine

## 2019-02-15 ENCOUNTER — Other Ambulatory Visit: Payer: Self-pay | Admitting: Adult Health

## 2019-02-15 DIAGNOSIS — Z760 Encounter for issue of repeat prescription: Secondary | ICD-10-CM

## 2019-03-01 ENCOUNTER — Other Ambulatory Visit: Payer: Self-pay | Admitting: Adult Health

## 2019-03-01 DIAGNOSIS — Z7901 Long term (current) use of anticoagulants: Secondary | ICD-10-CM

## 2019-03-04 NOTE — Telephone Encounter (Signed)
[place] for protonix 90 +1

## 2019-03-06 ENCOUNTER — Inpatient Hospital Stay (HOSPITAL_COMMUNITY)
Admission: EM | Admit: 2019-03-06 | Discharge: 2019-03-12 | DRG: 065 | Disposition: A | Discharge status: Home Health | Payer: Medicare Other | Attending: Internal Medicine | Admitting: Internal Medicine

## 2019-03-06 ENCOUNTER — Other Ambulatory Visit: Payer: Self-pay

## 2019-03-06 ENCOUNTER — Emergency Department (HOSPITAL_COMMUNITY): Payer: Medicare Other

## 2019-03-06 DIAGNOSIS — R51 Headache: Secondary | ICD-10-CM | POA: Diagnosis not present

## 2019-03-06 DIAGNOSIS — Z79891 Long term (current) use of opiate analgesic: Secondary | ICD-10-CM

## 2019-03-06 DIAGNOSIS — R1310 Dysphagia, unspecified: Secondary | ICD-10-CM | POA: Diagnosis present

## 2019-03-06 DIAGNOSIS — I639 Cerebral infarction, unspecified: Secondary | ICD-10-CM

## 2019-03-06 DIAGNOSIS — Z91011 Allergy to milk products: Secondary | ICD-10-CM

## 2019-03-06 DIAGNOSIS — Z86711 Personal history of pulmonary embolism: Secondary | ICD-10-CM

## 2019-03-06 DIAGNOSIS — G8929 Other chronic pain: Secondary | ICD-10-CM | POA: Diagnosis not present

## 2019-03-06 DIAGNOSIS — Z7901 Long term (current) use of anticoagulants: Secondary | ICD-10-CM | POA: Diagnosis not present

## 2019-03-06 DIAGNOSIS — Z7982 Long term (current) use of aspirin: Secondary | ICD-10-CM

## 2019-03-06 DIAGNOSIS — I110 Hypertensive heart disease with heart failure: Secondary | ICD-10-CM | POA: Diagnosis not present

## 2019-03-06 DIAGNOSIS — G8194 Hemiplegia, unspecified affecting left nondominant side: Secondary | ICD-10-CM | POA: Diagnosis present

## 2019-03-06 DIAGNOSIS — R4702 Dysphasia: Secondary | ICD-10-CM | POA: Diagnosis present

## 2019-03-06 DIAGNOSIS — K219 Gastro-esophageal reflux disease without esophagitis: Secondary | ICD-10-CM | POA: Diagnosis present

## 2019-03-06 DIAGNOSIS — R040 Epistaxis: Secondary | ICD-10-CM | POA: Diagnosis not present

## 2019-03-06 DIAGNOSIS — Z888 Allergy status to other drugs, medicaments and biological substances status: Secondary | ICD-10-CM | POA: Diagnosis not present

## 2019-03-06 DIAGNOSIS — Z8673 Personal history of transient ischemic attack (TIA), and cerebral infarction without residual deficits: Secondary | ICD-10-CM | POA: Diagnosis not present

## 2019-03-06 DIAGNOSIS — I350 Nonrheumatic aortic (valve) stenosis: Secondary | ICD-10-CM | POA: Diagnosis present

## 2019-03-06 DIAGNOSIS — I10 Essential (primary) hypertension: Secondary | ICD-10-CM | POA: Diagnosis not present

## 2019-03-06 DIAGNOSIS — Z9049 Acquired absence of other specified parts of digestive tract: Secondary | ICD-10-CM

## 2019-03-06 DIAGNOSIS — Z8041 Family history of malignant neoplasm of ovary: Secondary | ICD-10-CM

## 2019-03-06 DIAGNOSIS — I63411 Cerebral infarction due to embolism of right middle cerebral artery: Principal | ICD-10-CM | POA: Diagnosis present

## 2019-03-06 DIAGNOSIS — Z20828 Contact with and (suspected) exposure to other viral communicable diseases: Secondary | ICD-10-CM | POA: Diagnosis present

## 2019-03-06 DIAGNOSIS — R29702 NIHSS score 2: Secondary | ICD-10-CM | POA: Diagnosis not present

## 2019-03-06 DIAGNOSIS — I5030 Unspecified diastolic (congestive) heart failure: Secondary | ICD-10-CM | POA: Diagnosis present

## 2019-03-06 DIAGNOSIS — R29810 Facial weakness: Secondary | ICD-10-CM | POA: Diagnosis present

## 2019-03-06 DIAGNOSIS — I6601 Occlusion and stenosis of right middle cerebral artery: Secondary | ICD-10-CM | POA: Diagnosis not present

## 2019-03-06 DIAGNOSIS — Z823 Family history of stroke: Secondary | ICD-10-CM

## 2019-03-06 DIAGNOSIS — I5032 Chronic diastolic (congestive) heart failure: Secondary | ICD-10-CM | POA: Diagnosis not present

## 2019-03-06 DIAGNOSIS — Z8501 Personal history of malignant neoplasm of esophagus: Secondary | ICD-10-CM | POA: Diagnosis not present

## 2019-03-06 DIAGNOSIS — E782 Mixed hyperlipidemia: Secondary | ICD-10-CM | POA: Diagnosis not present

## 2019-03-06 DIAGNOSIS — I2510 Atherosclerotic heart disease of native coronary artery without angina pectoris: Secondary | ICD-10-CM | POA: Diagnosis not present

## 2019-03-06 DIAGNOSIS — R471 Dysarthria and anarthria: Secondary | ICD-10-CM | POA: Diagnosis present

## 2019-03-06 DIAGNOSIS — Z91041 Radiographic dye allergy status: Secondary | ICD-10-CM

## 2019-03-06 DIAGNOSIS — Z8249 Family history of ischemic heart disease and other diseases of the circulatory system: Secondary | ICD-10-CM

## 2019-03-06 DIAGNOSIS — Z825 Family history of asthma and other chronic lower respiratory diseases: Secondary | ICD-10-CM

## 2019-03-06 DIAGNOSIS — Z79899 Other long term (current) drug therapy: Secondary | ICD-10-CM

## 2019-03-06 DIAGNOSIS — I480 Paroxysmal atrial fibrillation: Secondary | ICD-10-CM | POA: Diagnosis present

## 2019-03-06 DIAGNOSIS — R4781 Slurred speech: Secondary | ICD-10-CM | POA: Diagnosis not present

## 2019-03-06 LAB — DIFFERENTIAL
Abs Immature Granulocytes: 0.02 10*3/uL (ref 0.00–0.07)
Basophils Absolute: 0 10*3/uL (ref 0.0–0.1)
Basophils Relative: 0 %
Eosinophils Absolute: 0.1 10*3/uL (ref 0.0–0.5)
Eosinophils Relative: 1 %
Immature Granulocytes: 0 %
Lymphocytes Relative: 30 %
Lymphs Abs: 1.9 10*3/uL (ref 0.7–4.0)
Monocytes Absolute: 0.5 10*3/uL (ref 0.1–1.0)
Monocytes Relative: 8 %
Neutro Abs: 3.9 10*3/uL (ref 1.7–7.7)
Neutrophils Relative %: 61 %

## 2019-03-06 LAB — CBC
HCT: 42.6 % (ref 36.0–46.0)
Hemoglobin: 14.2 g/dL (ref 12.0–15.0)
MCH: 29.2 pg (ref 26.0–34.0)
MCHC: 33.3 g/dL (ref 30.0–36.0)
MCV: 87.5 fL (ref 80.0–100.0)
Platelets: 171 10*3/uL (ref 150–400)
RBC: 4.87 MIL/uL (ref 3.87–5.11)
RDW: 12.8 % (ref 11.5–15.5)
WBC: 6.5 10*3/uL (ref 4.0–10.5)
nRBC: 0 % (ref 0.0–0.2)

## 2019-03-06 LAB — I-STAT CHEM 8, ED
BUN: 14 mg/dL (ref 8–23)
Calcium, Ion: 1.17 mmol/L (ref 1.15–1.40)
Chloride: 100 mmol/L (ref 98–111)
Creatinine, Ser: 1 mg/dL (ref 0.44–1.00)
Glucose, Bld: 168 mg/dL — ABNORMAL HIGH (ref 70–99)
HCT: 43 % (ref 36.0–46.0)
Hemoglobin: 14.6 g/dL (ref 12.0–15.0)
Potassium: 4 mmol/L (ref 3.5–5.1)
Sodium: 140 mmol/L (ref 135–145)
TCO2: 29 mmol/L (ref 22–32)

## 2019-03-06 LAB — COMPREHENSIVE METABOLIC PANEL
ALT: 15 U/L (ref 0–44)
AST: 17 U/L (ref 15–41)
Albumin: 4 g/dL (ref 3.5–5.0)
Alkaline Phosphatase: 89 U/L (ref 38–126)
Anion gap: 7 (ref 5–15)
BUN: 13 mg/dL (ref 8–23)
CO2: 30 mmol/L (ref 22–32)
Calcium: 9 mg/dL (ref 8.9–10.3)
Chloride: 102 mmol/L (ref 98–111)
Creatinine, Ser: 0.9 mg/dL (ref 0.44–1.00)
GFR calc Af Amer: >60 mL/min (ref >60)
GFR calc non Af Amer: >60 mL/min (ref >60)
Glucose, Bld: 174 mg/dL — ABNORMAL HIGH (ref 70–99)
Potassium: 4.1 mmol/L (ref 3.5–5.1)
Sodium: 139 mmol/L (ref 135–145)
Total Bilirubin: 1 mg/dL (ref 0.3–1.2)
Total Protein: 7.2 g/dL (ref 6.5–8.1)

## 2019-03-06 LAB — URINALYSIS, ROUTINE W REFLEX MICROSCOPIC
Bacteria, UA: NONE SEEN
Bilirubin Urine: NEGATIVE
Glucose, UA: NEGATIVE mg/dL
Ketones, ur: NEGATIVE mg/dL
Leukocytes,Ua: NEGATIVE
Nitrite: NEGATIVE
Protein, ur: NEGATIVE mg/dL
Specific Gravity, Urine: 1.009 (ref 1.005–1.030)
pH: 8 (ref 5.0–8.0)

## 2019-03-06 LAB — APTT: aPTT: 35 seconds (ref 24–36)

## 2019-03-06 LAB — RAPID URINE DRUG SCREEN, HOSP PERFORMED
Amphetamines: NOT DETECTED
Barbiturates: NOT DETECTED
Benzodiazepines: POSITIVE — AB
Cocaine: NOT DETECTED
Opiates: NOT DETECTED
Tetrahydrocannabinol: NOT DETECTED

## 2019-03-06 LAB — CBG MONITORING, ED: Glucose-Capillary: 164 mg/dL — ABNORMAL HIGH (ref 70–99)

## 2019-03-06 LAB — SARS CORONAVIRUS 2 BY RT PCR (HOSPITAL ORDER, PERFORMED IN ~~LOC~~ HOSPITAL LAB): SARS Coronavirus 2: NEGATIVE

## 2019-03-06 LAB — PROTIME-INR
INR: 1.6 — ABNORMAL HIGH (ref 0.8–1.2)
Prothrombin Time: 18.5 seconds — ABNORMAL HIGH (ref 11.4–15.2)

## 2019-03-06 LAB — ETHANOL: Alcohol, Ethyl (B): <10 mg/dL (ref <10)

## 2019-03-06 MED ORDER — DIPHENHYDRAMINE HCL 50 MG/ML IJ SOLN: 50.0000 mg | Freq: Once | INTRAMUSCULAR | Status: DC

## 2019-03-06 MED ORDER — HYDROCORTISONE NA SUCCINATE PF 250 MG IJ SOLR: 200.0000 mg | Freq: Once | INTRAMUSCULAR | Status: DC

## 2019-03-06 MED ORDER — DIPHENHYDRAMINE HCL 25 MG PO CAPS: 50.0000 mg | ORAL_CAPSULE | Freq: Once | ORAL | Status: DC

## 2019-03-06 NOTE — ED Provider Notes (Addendum)
[facility] EMERGENCY DEPARTMENT Provider Note   CSN: [id number] Arrival date & time: 03/06/19  2100     History   Chief Complaint Chief Complaint  Patient presents with  . Code Stroke    HPI [name] is a 78 y.o. female.  She is brought in by her son for an acute strokelike symptom.  He said at 7 PM she was eating dinner with him when she acutely had food fall out of her mouth and was not able to speak.  He said he tried to put her to bed and she was having some slurred speech.  She had been complaining of not feeling well earlier today and feeling like she was in A. fib.  She is complaining here of a posterior headache.     The history is provided by the patient and a relative.  Cerebrovascular Accident This is a new problem. Episode onset: 2 1/2. The problem occurs constantly. The problem has not changed since onset.Associated symptoms include headaches. Pertinent negatives include no chest pain, no abdominal pain and no shortness of breath. Nothing aggravates the symptoms. Nothing relieves the symptoms. She has tried nothing for the symptoms. The treatment provided no relief.    Past Medical History:  Diagnosis Date  . Anemia 2005   Generally microcytic, transfusions in [address], 2012, 02/2015, 05/2015  . Aortic stenosis    Moderate November 2017  . Arthritis   . Broken back 2013   Chronic back pain.   [name] CAD (coronary artery disease)    Cardiac catheterization 2014 - 80% mid RCA and 70% OM managed medically  . CHF (congestive heart failure) ([place])   . Chronic bilateral pleural effusions   . Chronic headache   . Contrast media allergy   . Diastolic heart failure ([place])   . Esophageal cancer ([place]) 05/06/2015   Adenocarcinoma GE junction  . Facial paresthesia   . GERD (gastroesophageal reflux disease)   . H. pylori infection   . H/O iron deficiency    05-06-15 iron infusion ([place])  . HH (hiatus hernia) 2008   Large with associated erosions  . Hypertension   .  Paroxysmal atrial fibrillation ([name])   . PONV (postoperative nausea and vomiting)   . Pulmonary emboli ([place]) 2008, 2012  . Stroke [facility]) 1982    Patient Active Problem List   Diagnosis Date Noted  . S/P left knee arthroscopy 04/18/18 06/06/2018  . Acute medial meniscus tear, left, subsequent encounter 04/18/2018  . Hypokalemia 04/15/2018  . Syncope and collapse 04/15/2018  . Pneumonia 02/18/2018  . Bilateral pleural effusion   . Generalized postprandial abdominal pain   . CAP (community acquired pneumonia) 02/15/2018  . Orthostatic hypotension 12/21/2017  . Peripheral neuropathy 12/21/2017  . CAD (coronary artery disease) 12/19/2017  . Long term (current) use of anticoagulants 03/14/2017  . Numbness of face 01/09/2017  . Unstable angina ([place]) 11/02/2016  . Other fatigue 11/02/2016  . B12 deficiency 10/16/2016  . Preoperative cardiovascular examination 09/21/2016  . Hyperglycemia 06/26/2016  . Atrial fibrillation with RVR ([place]) 06/23/2016  . Encounter for medication review and counseling 06/07/2016  . Essential hypertension, benign 04/19/2016  . Aortic valve stenosis 09/28/2015  . GE junction carcinoma ([place]) 05/14/2015  . [place] lesion, acute   . IDA (iron deficiency anemia)   . Mixed hyperlipidemia 05/05/2015  . GERD (gastroesophageal reflux disease) 05/05/2015  . Absolute anemia   . Anticoagulated on Coumadin   . Osteoarthritis 04/16/2015  . History of pulmonary embolism 03/02/2015  .  Shortness of breath 02/22/2015  . Benign neoplasm of descending colon 02/17/2015  . Diverticulosis of large intestine without diverticulitis 02/17/2015  . Esophageal stricture 02/16/2015  . UGI bleed 02/13/2015  . Supratherapeutic INR 02/13/2015  . Thoracic back pain 10/15/2014  . Macular degeneration 09/01/2014  . Benign paroxysmal positional vertigo   . Uncontrolled hypertension 08/24/2014  . Complaints of total body pain 08/03/2014  . Sleep disturbance 08/03/2014  . PAF (paroxysmal  atrial fibrillation) ([place]) 01/21/2014  . Long term current use of anticoagulant therapy 01/21/2014  . Chronic diastolic congestive heart failure ([place]) 01/21/2014  . Chest pain 01/21/2014    Past Surgical History:  Procedure Laterality Date  . APPENDECTOMY  1950s  . BACK SURGERY    . CARPAL TUNNEL RELEASE Bilateral 1990s  . [place] SURGERY  2014  . CHOLECYSTECTOMY  1980s   open  . COLONOSCOPY N/A 02/17/2015   Procedure: COLONOSCOPY;  Surgeon: [name], MD;  Location: [facility] ENDOSCOPY;  Service: Endoscopy;  Laterality: N/A;  . ESOPHAGOGASTRODUODENOSCOPY N/A 02/16/2015   Procedure: ESOPHAGOGASTRODUODENOSCOPY (EGD);  Surgeon: [name], MD;  Location: [facility];  Service: Endoscopy;  Laterality: N/A;  . ESOPHAGOGASTRODUODENOSCOPY N/A 05/06/2015   Procedure: ESOPHAGOGASTRODUODENOSCOPY (EGD);  Surgeon: [name], MD;  Location: [facility] ENDOSCOPY;  Service: Endoscopy;  Laterality: N/A;  . EUS N/A 05/20/2015   Procedure: UPPER ENDOSCOPIC ULTRASOUND (EUS) LINEAR;  Surgeon: [name], MD;  Location: [facility] ENDOSCOPY;  Service: Endoscopy;  Laterality: N/A;  . exploratory lab  1950s or 1960s  . GIVENS CAPSULE STUDY N/A 05/06/2015   Procedure: GIVENS CAPSULE STUDY;  Surgeon: [name], MD;  Location[facility] ENDOSCOPY;  Service: Endoscopy;  Laterality: N/A;  . KNEE ARTHROSCOPY WITH MEDIAL MENISECTOMY Left 04/18/2018   Procedure: LEFT KNEE ARTHROSCOPY WITH PARTIAL MEDIAL MENISECTOMY AND ANTERIOR CRUCIATE LIGAMENT DEBRIDEMENT;  Surgeon: [name], MD;  Location: [facility]S;  Service: Orthopedics;  Laterality: Left;  . LEFT HEART CATH AND CORONARY ANGIOGRAPHY N/A 11/08/2016   Procedure: Left Heart Cath and Coronary Angiography;  Surgeon: [name], MD;  Location: [place] CV LAB;  Service: Cardiovascular;  Laterality: N/A;  . lumbar back surgery  2012  . [place] SURGERY  1991  . TONSILLECTOMY    . TONSILLECTOMY AND ADENOIDECTOMY  1960s     OB History   No obstetric  history on file.      Home Medications    Prior to Admission medications   Medication Sig Start Date End Date Taking? Authorizing Provider  acetaminophen (TYLENOL) 500 MG tablet Take 500-1,000 mg by mouth daily as needed for mild pain or moderate pain.     [provider]  amLODipine (NORVASC) 5 MG tablet Take 1 tablet (5 mg total) by mouth daily. 12/25/18   Pixie Casino, MD  aspirin EC 81 MG tablet Take 1 tablet (81 mg total) by mouth daily. 11/02/16   Hilty, Nadean Corwin, MD  carvedilol (COREG) 25 MG tablet TAKE (1) TABLET BY MOUTH TWICE DAILY WITH A MEAL. 04/03/18   Nafziger, Tommi Rumps, NP  ezetimibe-simvastatin (VYTORIN) 10-20 MG tablet TAKE (1) TABLET BY MOUTH DAILY. 02/17/19   Hilty, Nadean Corwin, MD  furosemide (LASIX) 40 MG tablet TAKE 1 TABLET BY MOUTH ONCE DAILY. 02/19/19   Nafziger, Tommi Rumps, NP  meclizine (ANTIVERT) 25 MG tablet Take 1 tablet (25 mg total) by mouth as needed for dizziness or nausea. 08/02/18   Nafziger, Tommi Rumps, NP  ondansetron (ZOFRAN) 4 MG tablet Take 1 tablet (4  mg total) by mouth every 8 (eight) hours as needed for nausea or vomiting. 02/19/18   Nafziger, Tommi Rumps, NP  oxyCODONE (OXY IR/ROXICODONE) 5 MG immediate release tablet Take 1 tablet (5 mg total) by mouth every 6 (six) hours as needed for breakthrough pain. 04/22/18   Oscar La, Arlo C, PA-C  pantoprazole (PROTONIX) 40 MG tablet TAKE 1 TABLET BY MOUTH TWICE DAILY. 03/04/19   Nafziger, Tommi Rumps, NP  PARoxetine (PAXIL) 30 MG tablet Take 1 tablet (30 mg total) by mouth daily. 08/02/18 10/31/18  Dorothyann Peng, NP  Pediatric Multivitamins-Iron (FLINTSTONES PLUS IRON) chewable tablet Chew 2 tablets by mouth daily. 05/30/16   Samuella Cota, MD  potassium chloride (K-DUR) 10 MEQ tablet Take 1 tablet (10 mEq total) by mouth daily. 06/07/18   Nafziger, Tommi Rumps, NP  temazepam (RESTORIL) 15 MG capsule TAKE (1) CAPSULE BY MOUTH AT BEDTIME. 02/19/19   Nafziger, Tommi Rumps, NP  traMADol (ULTRAM) 50 MG tablet TAKE (1) TABLET BY MOUTH EVERY EIGHT  HOURS. 02/19/19   Nafziger, Tommi Rumps, NP  triamcinolone cream (KENALOG) 0.1 % Apply 1 application topically 2 (two) times daily. 11/18/18   Burchette, Alinda Sierras, MD  warfarin (COUMADIN) 5 MG tablet Take 1/2 tablet daily except take 1 tablet on Sun and Thurs or TAKE AS DIRECTED BY ANTICOAGULATION CLINIC 03/05/19   Dorothyann Peng, NP    Family History Family History  Problem Relation Age of Onset  . Stroke Mother   . Heart disease Mother   . Emphysema Father   . Ovarian cancer Sister   . Stroke Sister   . Other Child        died at birth    Social History Social History   Tobacco Use  . Smoking status: Never Smoker  . Smokeless tobacco: Never Used  Substance Use Topics  . Alcohol use: No    Alcohol/week: 0.0 standard drinks  . Drug use: No     Allergies   Iodinated diagnostic agents, Ioxaglate, Red dye, Lactose intolerance (gi), Whey, Darvon [propoxyphene], Hydralazine, and Milk-related compounds   Review of Systems Review of Systems  Unable to perform ROS: Mental status change  Respiratory: Negative for shortness of breath.   Cardiovascular: Negative for chest pain.  Gastrointestinal: Negative for abdominal pain.  Neurological: Positive for headaches.     Physical Exam Updated Vital Signs BP (!) 175/79   Pulse 71   Temp 98.1 F (36.7 C) (Oral)   Resp (!) 21   Ht 5\' 2"  (1.575 m)   Wt 52.2 kg   SpO2 96%   BMI 21.03 kg/m   Physical Exam Vitals signs and nursing note reviewed.  Constitutional:      General: She is not in acute distress.    Appearance: She is well-developed.  HENT:     Head: Normocephalic and atraumatic.  Eyes:     Conjunctiva/sclera: Conjunctivae normal.  Neck:     Musculoskeletal: Normal range of motion and neck supple.  Cardiovascular:     Rate and Rhythm: Normal rate. Rhythm irregular.     Heart sounds: No murmur.  Pulmonary:     Effort: Pulmonary effort is normal. No respiratory distress.     Breath sounds: Normal breath sounds.   Abdominal:     Palpations: Abdomen is soft.     Tenderness: There is no abdominal tenderness.  Skin:    General: Skin is warm and dry.     Capillary Refill: Capillary refill takes less than 2 seconds.  Neurological:     Mental  Status: She is alert.     Cranial Nerves: Cranial nerve deficit present.     Motor: Weakness present.     Comments: She is awake and alert and following commands.  She has some left facial droop and some slurred speech.  She appears to have some left pronator drift.  She also appears to have some left leg weakness.      ED Treatments / Results  Labs (all labs ordered are listed, but only abnormal results are displayed) Labs Reviewed  PROTIME-INR - Abnormal; Notable for the following components:      Result Value   Prothrombin Time 18.5 (*)    INR 1.6 (*)    All other components within normal limits  COMPREHENSIVE METABOLIC PANEL - Abnormal; Notable for the following components:   Glucose, Bld 174 (*)    All other components within normal limits  CBG MONITORING, ED - Abnormal; Notable for the following components:   Glucose-Capillary 164 (*)    All other components within normal limits  I-STAT CHEM 8, ED - Abnormal; Notable for the following components:   Glucose, Bld 168 (*)    All other components within normal limits  SARS CORONAVIRUS 2 (HOSPITAL ORDER, [facility])  ETHANOL  APTT  CBC  DIFFERENTIAL  RAPID URINE DRUG SCREEN, HOSP PERFORMED  URINALYSIS, ROUTINE W REFLEX MICROSCOPIC    EKG EKG Interpretation  Date/Time:  Thursday March 06 2019 22:17:56 EDT Ventricular Rate:  88 PR Interval:    QRS Duration: 82 QT Interval:  377 QTC Calculation: 457 R Axis:   80 Text Interpretation:  Atrial fibrillation Ventricular premature complex Confirmed by Aletta Edouard [phone]) on 03/06/2019 10:31:12 PM   Radiology Ct Head Code Stroke Wo Contrast  Result Date: 03/06/2019 CLINICAL DATA:  Code stroke. Slurred speech.  Left-sided facial droop. Symptoms began 1900 hours. Headache. EXAM: CT HEAD WITHOUT CONTRAST TECHNIQUE: Contiguous axial images were obtained from the base of the skull through the vertex without intravenous contrast. COMPARISON:  MRI 12/20/2017.  CT 12/19/2017. FINDINGS: Brain: Old infarctions affect the right cerebellum. Cerebral hemispheres show early cortical low-density in the right frontal operculum. There is old infarction in the radiating white matter tracts on the right. No mass lesion, hemorrhage, hydrocephalus or extra-axial collection. Vascular: There is atherosclerotic calcification of the major vessels at the base of the brain. Skull: Normal Sinuses/Orbits: Clear/normal Other: None ASPECTS ([place] Stroke Program Early CT Score) - Ganglionic level infarction (caudate, lentiform nuclei, internal capsule, insula, M1-M3 cortex): 6 - Supraganglionic infarction (M4-M6 cortex): 3 Total score (0-10 with 10 being normal): 9 IMPRESSION: 1. Question loss of gray-white differentiation in the right frontal operculum, M1 sector. No evidence of hemorrhage or mass effect. Old infarctions in the right cerebellum and right radiating white matter tracts. 2. ASPECTS is 9 3. These results were called by telephone at the time of interpretation on 03/06/2019 at 9:44 pm to Dr. Aletta Edouard , who verbally acknowledged these results. Electronically Signed   By: Nelson Chimes M.D.   On: 03/06/2019 21:47    Procedures .Critical Care Performed by: Hayden Rasmussen, MD Authorized by: Hayden Rasmussen, MD   Critical care provider statement:    Critical care time (minutes):  45   Critical care was necessary to treat or prevent imminent or life-threatening deterioration of the following conditions:  CNS failure or compromise   Critical care was time spent personally by me on the following activities:  Discussions with consultants, evaluation of  patient's response to treatment, examination of patient, ordering and  performing treatments and interventions, ordering and review of laboratory studies, ordering and review of radiographic studies, pulse oximetry, re-evaluation of patient's condition, obtaining history from patient or surrogate, review of old charts and development of treatment plan with patient or surrogate   I assumed direction of critical care for this patient from another provider in my specialty: no     (including critical care time)  Medications Ordered in ED Medications - No data to display   Initial Impression / Assessment and Plan / ED Course  I have reviewed the triage vital signs and the nursing notes.  Pertinent labs & imaging results that were available during my care of the patient were reviewed by me and considered in my medical decision making (see chart for details).  Clinical Course as of Mar 05 2258  Thu Mar 06, 2019  2140 Patient here with acute slurred speech left facial droop and probable left-sided weakness.  She has a stroke activation.  Differential includes stroke, bleed, metabolic derangement, vascular injury.  Fingerstick 164.  CT emergently done and reviewed by me, I do not see any acute bleed.  Awaiting tele-neurology for stroke evaluation.   [MB]  2149 Stroke consult is ongoing with neurologist.  Received a call from radiology saying that they see some gray-white differentiation concerning for maybe early stroke in the right frontal area possibly MCA branch but no hemorrhage.  Reviewed this with neurology.  She said she would not be a TPA candidate as her PT is too high although her INR is not.   [MB]  2203 Tele-neurologist is evaluated the patient and agrees that she has had a stroke likely from being subtherapeutic on her Coumadin.  She is recommending that the patient get an angiogram make sure that she does not have any LVO.  The patient has a contrast allergy and does not appear to have had any contrast studies recently.  Options would be to premedicate her or  to transfer her for an MRI MRA.  I will review this with neurology at [facility] for recommendations.   [MB]  2228 Patient's son is refusing to agree that the patient can receive contrast despite being premedicated.  He is in agreement that she can be transferred to [facility] if getting an MRI is that important.   [MB]  2232 Discussed with Dr. Vallery Ridge at [facility] who will accept the patient to the ED for an MRI/MRA.   [MB]    Clinical Course User Index [MB] Hayden Rasmussen, MD   [name] was evaluated in Emergency Department on 03/06/2019 for the symptoms described in the history of present illness. She was evaluated in the context of the global COVID-19 pandemic, which necessitated consideration that the patient might be at risk for infection with the SARS-CoV-2 virus that causes COVID-19. Institutional protocols and algorithms that pertain to the evaluation of patients at risk for COVID-19 are in a state of rapid change based on information released by regulatory bodies including the CDC and federal and state organizations. These policies and algorithms were followed during the patient's care in the ED.    CHA2DS2/VAS Stroke Risk Points  Current as of 7 minutes ago     9 >= 2 Points: High Risk  1 - 1.99 Points: Medium Risk  0 Points: Low Risk    This is the only CHA2DS2/VAS Stroke Risk Points available for the past  year.: Last Change: N/A  Details    This score determines the patient's risk of having a stroke if the  patient has atrial fibrillation.       Points Metrics  1 Has Congestive Heart Failure:  Yes    Current as of 7 minutes ago  1 Has Vascular Disease:  Yes    Current as of 7 minutes ago  1 Has Hypertension:  Yes    Current as of 7 minutes ago  2 Age:  19    Current as of 7 minutes ago  1 Has Diabetes:  Yes    Current as of 7 minutes ago  2 Had Stroke:  Yes  Had TIA:  No  Had thromboembolism:  Yes    Current as of 7 minutes ago  1 Female:  Yes    Current as of 7  minutes ago      Discussed with Dr Programmer, applications at [facility] prior to transfer.       Final Clinical Impressions(s) / ED Diagnoses   Final diagnoses:  Cerebrovascular accident (CVA), unspecified mechanism [facility])    ED Discharge Orders    None       Hayden Rasmussen, MD 03/06/19 2259    Hayden Rasmussen, MD 03/06/19 2300    Hayden Rasmussen, MD 03/06/19 2311

## 2019-03-06 NOTE — ED Triage Notes (Signed)
Pt with sudden onset of slurred speech and left side weakness onset 7 pm

## 2019-03-06 NOTE — ED Notes (Signed)
CODE STROKE PAGED @ 2125

## 2019-03-06 NOTE — Consult Note (Signed)
[facility] Tele[facility]urology Consult Services   Date of Service:   03/06/2019 21:35:25  Impression:     .  Rule Out Acute Ischemic Stroke  Comments/Sign-Out: 78 year old female who presents to the hospital because of left facial droop and slurred speech. In the setting of subtherapeutic INR presentation is likely due to cardioembolic stroke.  Mechanism of Stroke: Possible Cardioembolic  Metrics: Last Known Well: 03/06/2019 18:00:00 [facility] Notification Time: 03/06/2019 21:35:25 Arrival Time: 03/06/2019 21:51:01 Stamp Time: 03/06/2019 21:35:25 Time First Login Attempt: 03/06/2019 21:42:00 Video Start Time: 03/06/2019 21:42:00  Symptoms: Left facial droop NIHSS Start Assessment Time: 03/06/2019 21:45:00 Patient is not a candidate for Alteplase/Activase. Patient was not deemed candidate for Alteplase/Activase thrombolytics because of Coagulopathy. Video End Time: 03/06/2019 21:50:00  CT head showed no acute hemorrhage or acute core infarct.  Clinical Presentation is not Suggestive of Large Vessel Occlusive Disease  ED Physician notified of diagnostic impression and management plan on 03/06/2019 21:54:00  Our recommendations are outlined below.  Recommendations:     .  Activate Stroke Protocol Admission/Order Set     .  Stroke/Telemetry Floor     .  Neuro Checks     .  Bedside Swallow Eval     .  DVT Prophylaxis     .  IV Fluids, Normal Saline     .  Head of Bed 30 Degrees     .  Euglycemia and Avoid Hyperthermia (PRN Acetaminophen)     .  Antiplatelet Therapy Recommended     .  Patient has Anaphylaxis to Iodinated dye     .  Recommend MRA head and neck  Routine Consultation with [facility] for Follow up Care  Sign Out:     .  Discussed with Emergency Department Provider    ------------------------------------------------------------------------------  History of Present Illness: Patient is a 78 year old Female.  Patient was  brought by private transportation with symptoms of Left facial droop  78 year old female with a history of atrial fibrillation and PE on Coumadin and HTN who presents to the hospital with left facial droop and slurred speech. Per patient's son patient was drooling and had trouble keeping food in her mouth because of left facial droop. She did not have any other focal weakness or numbness. Patient reports a posterior headache all day and reports that she has been in A fib all day.    Past Medical History:     . Hypertension     . Atrial Fibrillation     . Stroke  Anticoagulant use:  Coumadin    Examination: 1A: Level of Consciousness - Alert; keenly responsive + 0 1B: Ask Month and Age - Both Questions Right + 0 1C: Blink Eyes & Squeeze Hands - Performs Both Tasks + 0 2: Test Horizontal Extraocular Movements - Normal + 0 3: Test Visual Fields - No Visual Loss + 0 4: Test Facial Palsy (Use Grimace if Obtunded) - Minor paralysis (flat nasolabial fold, smile asymmetry) + 1 5A: Test Left Arm Motor Drift - No Drift for 10 Seconds + 0 5B: Test Right Arm Motor Drift - No Drift for 10 Seconds + 0 6A: Test Left Leg Motor Drift - No Drift for 5 Seconds + 0 6B: Test Right Leg Motor Drift - No Drift for 5 Seconds + 0 7: Test Limb Ataxia (FNF/Heel-Shin) - No Ataxia + 0 8: Test Sensation - Normal; No sensory loss + 0 9: Test Language/Aphasia - Normal; No aphasia +  0 10: Test Dysarthria - Mild-Moderate Dysarthria: Slurring but can be understood + 1 11: Test Extinction/Inattention - No abnormality + 0  NIHSS Score: 2  Patient/Family was informed the Neurology Consult would happen via TeleHealth consult by way of interactive audio and video telecommunications and consented to receiving care in this manner.   Due to the immediate potential for life-threatening deterioration due to underlying acute neurologic illness, I spent 35 minutes providing critical care. This time includes time for face to face  visit via telemedicine, review of medical records, imaging studies and discussion of findings with providers, the patient and/or family.   Dr Tsosie Billing   [facility] [phone]  Case [id number]

## 2019-03-07 ENCOUNTER — Encounter (HOSPITAL_COMMUNITY): Payer: Self-pay | Admitting: *Deleted

## 2019-03-07 ENCOUNTER — Inpatient Hospital Stay (HOSPITAL_COMMUNITY): Payer: Medicare Other

## 2019-03-07 ENCOUNTER — Emergency Department (HOSPITAL_COMMUNITY): Payer: Medicare Other

## 2019-03-07 DIAGNOSIS — E785 Hyperlipidemia, unspecified: Secondary | ICD-10-CM | POA: Diagnosis not present

## 2019-03-07 DIAGNOSIS — I639 Cerebral infarction, unspecified: Secondary | ICD-10-CM | POA: Diagnosis not present

## 2019-03-07 DIAGNOSIS — I480 Paroxysmal atrial fibrillation: Secondary | ICD-10-CM

## 2019-03-07 DIAGNOSIS — I6601 Occlusion and stenosis of right middle cerebral artery: Secondary | ICD-10-CM | POA: Diagnosis not present

## 2019-03-07 DIAGNOSIS — Z7901 Long term (current) use of anticoagulants: Secondary | ICD-10-CM | POA: Diagnosis not present

## 2019-03-07 DIAGNOSIS — I6300 Cerebral infarction due to thrombosis of unspecified precerebral artery: Secondary | ICD-10-CM

## 2019-03-07 DIAGNOSIS — I2510 Atherosclerotic heart disease of native coronary artery without angina pectoris: Secondary | ICD-10-CM | POA: Diagnosis present

## 2019-03-07 DIAGNOSIS — I63311 Cerebral infarction due to thrombosis of right middle cerebral artery: Secondary | ICD-10-CM | POA: Diagnosis not present

## 2019-03-07 DIAGNOSIS — Z91041 Radiographic dye allergy status: Secondary | ICD-10-CM | POA: Diagnosis not present

## 2019-03-07 DIAGNOSIS — I110 Hypertensive heart disease with heart failure: Secondary | ICD-10-CM | POA: Diagnosis present

## 2019-03-07 DIAGNOSIS — I63411 Cerebral infarction due to embolism of right middle cerebral artery: Secondary | ICD-10-CM | POA: Diagnosis not present

## 2019-03-07 DIAGNOSIS — Z9049 Acquired absence of other specified parts of digestive tract: Secondary | ICD-10-CM | POA: Diagnosis not present

## 2019-03-07 DIAGNOSIS — R791 Abnormal coagulation profile: Secondary | ICD-10-CM | POA: Diagnosis not present

## 2019-03-07 DIAGNOSIS — I10 Essential (primary) hypertension: Secondary | ICD-10-CM

## 2019-03-07 DIAGNOSIS — E782 Mixed hyperlipidemia: Secondary | ICD-10-CM | POA: Diagnosis not present

## 2019-03-07 DIAGNOSIS — I350 Nonrheumatic aortic (valve) stenosis: Secondary | ICD-10-CM | POA: Diagnosis not present

## 2019-03-07 DIAGNOSIS — R040 Epistaxis: Secondary | ICD-10-CM | POA: Diagnosis not present

## 2019-03-07 DIAGNOSIS — Z86711 Personal history of pulmonary embolism: Secondary | ICD-10-CM | POA: Diagnosis not present

## 2019-03-07 DIAGNOSIS — Z8673 Personal history of transient ischemic attack (TIA), and cerebral infarction without residual deficits: Secondary | ICD-10-CM | POA: Diagnosis not present

## 2019-03-07 DIAGNOSIS — I63511 Cerebral infarction due to unspecified occlusion or stenosis of right middle cerebral artery: Secondary | ICD-10-CM | POA: Diagnosis not present

## 2019-03-07 DIAGNOSIS — I340 Nonrheumatic mitral (valve) insufficiency: Secondary | ICD-10-CM

## 2019-03-07 DIAGNOSIS — I5032 Chronic diastolic (congestive) heart failure: Secondary | ICD-10-CM | POA: Diagnosis not present

## 2019-03-07 DIAGNOSIS — K219 Gastro-esophageal reflux disease without esophagitis: Secondary | ICD-10-CM | POA: Diagnosis present

## 2019-03-07 DIAGNOSIS — Z888 Allergy status to other drugs, medicaments and biological substances status: Secondary | ICD-10-CM | POA: Diagnosis not present

## 2019-03-07 DIAGNOSIS — R1310 Dysphagia, unspecified: Secondary | ICD-10-CM | POA: Diagnosis present

## 2019-03-07 DIAGNOSIS — R471 Dysarthria and anarthria: Secondary | ICD-10-CM | POA: Diagnosis present

## 2019-03-07 DIAGNOSIS — G8194 Hemiplegia, unspecified affecting left nondominant side: Secondary | ICD-10-CM | POA: Diagnosis present

## 2019-03-07 DIAGNOSIS — R4781 Slurred speech: Secondary | ICD-10-CM | POA: Diagnosis not present

## 2019-03-07 DIAGNOSIS — R29702 NIHSS score 2: Secondary | ICD-10-CM | POA: Diagnosis present

## 2019-03-07 DIAGNOSIS — R29810 Facial weakness: Secondary | ICD-10-CM | POA: Diagnosis present

## 2019-03-07 DIAGNOSIS — R4702 Dysphasia: Secondary | ICD-10-CM | POA: Diagnosis present

## 2019-03-07 DIAGNOSIS — Z20828 Contact with and (suspected) exposure to other viral communicable diseases: Secondary | ICD-10-CM | POA: Diagnosis present

## 2019-03-07 DIAGNOSIS — Z8501 Personal history of malignant neoplasm of esophagus: Secondary | ICD-10-CM | POA: Diagnosis not present

## 2019-03-07 DIAGNOSIS — R1312 Dysphagia, oropharyngeal phase: Secondary | ICD-10-CM | POA: Diagnosis not present

## 2019-03-07 DIAGNOSIS — G8929 Other chronic pain: Secondary | ICD-10-CM | POA: Diagnosis present

## 2019-03-07 LAB — ECHOCARDIOGRAM COMPLETE
Height: 62 in
Weight: 1840 oz

## 2019-03-07 LAB — HEPARIN LEVEL (UNFRACTIONATED): Heparin Unfractionated: 0.15 IU/mL — ABNORMAL LOW (ref 0.30–0.70)

## 2019-03-07 MED ORDER — ATORVASTATIN CALCIUM 40 MG PO TABS
40.0000 mg | ORAL_TABLET | Freq: Every day | ORAL | Status: DC
  Administered 2019-03-10 – 2019-03-11 (×2): 40 mg via ORAL
  Filled 2019-03-07 (×2): qty 1

## 2019-03-07 MED ORDER — ACETAMINOPHEN 325 MG PO TABS: 650.0000 mg | ORAL_TABLET | ORAL | Status: DC | PRN

## 2019-03-07 MED ORDER — PAROXETINE HCL 30 MG PO TABS: 30.0000 mg | ORAL_TABLET | Freq: Every day | ORAL | Status: DC

## 2019-03-07 MED ORDER — STROKE: EARLY STAGES OF RECOVERY BOOK
Freq: Once | Status: AC
  Administered 2019-03-07: 18:00:00
  Filled 2019-03-07: qty 1

## 2019-03-07 MED ORDER — ACETAMINOPHEN 160 MG/5ML PO SOLN: 650.0000 mg | ORAL | Status: DC | PRN

## 2019-03-07 MED ORDER — PANTOPRAZOLE SODIUM 40 MG PO TBEC
40.0000 mg | DELAYED_RELEASE_TABLET | Freq: Two times a day (BID) | ORAL | Status: DC
  Filled 2019-03-07: qty 1

## 2019-03-07 MED ORDER — TEMAZEPAM 7.5 MG PO CAPS
15.0000 mg | ORAL_CAPSULE | Freq: Every evening | ORAL | Status: DC | PRN
  Filled 2019-03-07: qty 2

## 2019-03-07 MED ORDER — MECLIZINE HCL 12.5 MG PO TABS: 25.0000 mg | ORAL_TABLET | ORAL | Status: DC | PRN

## 2019-03-07 MED ORDER — TRAMADOL HCL 50 MG PO TABS: 50.0000 mg | ORAL_TABLET | Freq: Three times a day (TID) | ORAL | Status: DC | PRN

## 2019-03-07 MED ORDER — HEPARIN (PORCINE) 25000 UT/250ML-% IV SOLN
850.0000 [IU]/h | INTRAVENOUS | Status: DC
  Administered 2019-03-07: 650 [IU]/h via INTRAVENOUS
  Administered 2019-03-07: 800 [IU]/h via INTRAVENOUS
  Administered 2019-03-08 – 2019-03-09 (×2): 850 [IU]/h via INTRAVENOUS
  Filled 2019-03-07 (×3): qty 250

## 2019-03-07 MED ORDER — SODIUM CHLORIDE 0.9 % IV SOLN
Freq: Once | INTRAVENOUS | Status: AC
  Administered 2019-03-07: 09:00:00 via INTRAVENOUS

## 2019-03-07 MED ORDER — ACETAMINOPHEN 650 MG RE SUPP
650.0000 mg | RECTAL | Status: DC | PRN
  Administered 2019-03-08 – 2019-03-10 (×2): 650 mg via RECTAL
  Filled 2019-03-07 (×2): qty 1

## 2019-03-07 MED ORDER — OXYCODONE HCL 5 MG PO TABS: 5.0000 mg | ORAL_TABLET | Freq: Four times a day (QID) | ORAL | Status: DC | PRN

## 2019-03-07 MED ORDER — NICOTINE 21 MG/24HR TD PT24: 21.0000 mg | MEDICATED_PATCH | Freq: Every day | TRANSDERMAL | Status: DC | PRN

## 2019-03-07 MED ORDER — SODIUM CHLORIDE 0.9 % IV SOLN: Freq: Once | INTRAVENOUS | Status: DC

## 2019-03-07 MED ORDER — LABETALOL HCL 5 MG/ML IV SOLN
10.0000 mg | INTRAVENOUS | Status: AC | PRN
  Administered 2019-03-07: 10 mg via INTRAVENOUS
  Filled 2019-03-07: qty 4

## 2019-03-07 NOTE — Progress Notes (Signed)
ANTICOAGULATION CONSULT NOTE - Initial Consult  Pharmacy Consult for heparin Indication: atrial fibrillation and stroke  Allergies  Allergen Reactions  . Iodinated Diagnostic Agents Anaphylaxis and Other (See Comments)    IPD dye Info given by patient  . Ioxaglate Anaphylaxis and Other (See Comments)    Info given by patient  . Red Dye Anaphylaxis  . Lactose Intolerance (Gi)   . Whey Other (See Comments)    Lactose intolerance  . Darvon [Propoxyphene] Rash  . Hydralazine Anxiety and Other (See Comments)    Facial flushing, pt prefers not to use it.   . Milk-Related Compounds Other (See Comments)    Lactose intolerance Can tolerate milk if its cooked into the recipe, just can't drink milk     Patient Measurements: Height: 5\' 2"  (157.5 cm) Weight: 115 lb (52.2 kg) IBW/kg (Calculated) : 50.1 Heparin Dosing Weight: 52.2kg  Vital Signs: Temp: 98.1 F (36.7 C) (09/04 1952) Temp Source: Axillary (09/04 1700) BP: 196/90 (09/04 1952) Pulse Rate: 76 (09/04 1952)  Labs: Recent Labs    03/06/19 2132 03/06/19 2133 03/06/19 2137 03/07/19 1930  HGB  --  14.2 14.6  --   HCT  --  42.6 43.0  --   PLT  --  171  --   --   APTT 35  --   --   --   LABPROT 18.5*  --   --   --   INR 1.6*  --   --   --   HEPARINUNFRC  --   --   --  0.15*  CREATININE 0.90  --  1.00  --     Estimated Creatinine Clearance: 36.1 mL/min (by C-G formula based on SCr of 1 mg/dL).   Medical History: Past Medical History:  Diagnosis Date  . Anemia 2005   Generally microcytic, transfusions in [address], 2012, 02/2015, 05/2015  . Aortic stenosis    Moderate November 2017  . Arthritis   . Broken back 2013   Chronic back pain.   [name] CAD (coronary artery disease)    Cardiac catheterization 2014 - 80% mid RCA and 70% OM managed medically  . CHF (congestive heart failure) ([place])   . Chronic bilateral pleural effusions   . Chronic headache   . Contrast media allergy   . Diastolic heart failure ([place])   .  Esophageal cancer ([place]) 05/06/2015   Adenocarcinoma GE junction  . Facial paresthesia   . GERD (gastroesophageal reflux disease)   . H. pylori infection   . H/O iron deficiency    05-06-15 iron infusion ([facility])  . HH (hiatus hernia) 2008   Large with associated erosions  . Hypertension   . Paroxysmal atrial fibrillation ([name])   . PONV (postoperative nausea and vomiting)   . Pulmonary emboli ([place]) 2008, 2012  . Stroke [facility]) 1982    Medications:  Infusions:  . sodium chloride    . heparin 650 Units/hr (03/07/19 1056)    Assessment: 78 year old female who was on chronic warfarin prior to admission presented to the ED with slurred speech. She was found to have an acute stroke and as SUB therapeutic INR at 1.6. Pharmacy was consulted to start IV Heparin.  Initial Heparin level is low at 0.15 for goal of 0.3 to 0.5 on 650 units/hr. CBC is within normal limits. No bleeding or issues with infusion per RN.   Goal of Therapy:  Heparin level 0.3-0.5 units/ml Monitor platelets by anticoagulation protocol: Yes   Plan:  Increase  Heparin gtt to 800 units/hr - no bolus + lower goal with stroke protocol Check an 8 hr heparin level Daily heparin level and CBC  Sloan Leiter, PharmD, BCPS, BCCCP Clinical Pharmacist Clinical phone 03/07/2019 until 10:[id number] [phone] Please refer to [facility] for [place] numbers 03/07/2019,8:13 PM

## 2019-03-07 NOTE — Evaluation (Signed)
Occupational Therapy Evaluation Patient Details Name: [name] MRN: [id number] DOB: [birth year] Today's Date: 03/07/2019    History of Present Illness Patient is a 78 year old female admitted from home with slurred speech and drooling.FOund to have R MCA infarct. PMH to include: afib, HTN, CHF, CAD.   Clinical Impression   PT admitted with RMCA. Pt currently with functional limitiations due to the deficits listed below (see OT problem list). Pt with call light sounding and noted to not to have a call bell. Ot providing a bell at this time until unit can locate a call bell in the wall. Pt transferred to the bathroom voiding bowel.  Pt will benefit from skilled OT to increase their independence and safety with adls and balance to allow discharge hhot.     Follow Up Recommendations  Home health OT    Equipment Recommendations  None recommended by OT    Recommendations for Other Services       Precautions / Restrictions Precautions Precautions: Fall Restrictions Weight Bearing Restrictions: No      Mobility Bed Mobility Overal bed mobility: Modified Independent Bed Mobility: Supine to Sit     Supine to sit: Supervision     General bed mobility comments: HOB was 46 degrees  Transfers Overall transfer level: Needs assistance Equipment used: None Transfers: Sit to/from Stand Sit to Stand: Min assist         General transfer comment: urgency to transfer to bathroom    Balance Overall balance assessment: Needs assistance Sitting-balance support: Bilateral upper extremity supported;Feet supported Sitting balance-Leahy Scale: Good     Standing balance support: During functional activity Standing balance-Leahy Scale: Fair Standing balance comment: leaning against sink                           ADL either performed or assessed with clinical judgement   ADL Overall ADL's : Needs assistance/impaired   Eating/Feeding Details (indicate cue type and  reason): noted to have facial droop but aware adn wiping mout Grooming: Supervision/safety Grooming Details (indicate cue type and reason): static standnig and washing hands             Lower Body Dressing: Supervision/safety Lower Body Dressing Details (indicate cue type and reason): bed level Toilet Transfer: Min guard Toilet Transfer Details (indicate cue type and reason): able to doff diaper to void on commode. able to reach and pull toilet paper without (A) Toileting- Clothing Manipulation and Hygiene: Min guard       Functional mobility during ADLs: Minimal assistance General ADL Comments: call bell sounds on arrival and immedately with staff entering sittin geob to transfer to bathroom     Vision   Additional Comments: R gaze preference     Perception Perception Perception Tested?: Yes Perception Deficits: Inattention/neglect Inattention/Neglect: Impaired- to be further tested in functional context(L visual field miss a few times)   Praxis      Pertinent Vitals/Pain Pain Assessment: No/denies pain     Hand Dominance Right   Extremity/Trunk Assessment Upper Extremity Assessment Upper Extremity Assessment: Overall WFL for tasks assessed   Lower Extremity Assessment Lower Extremity Assessment: Overall WFL for tasks assessed   Cervical / Trunk Assessment Cervical / Trunk Assessment: Normal   Communication Communication Communication: Expressive difficulties   Cognition Arousal/Alertness: Awake/alert Behavior During Therapy: WFL for tasks assessed/performed Overall Cognitive Status: Difficult to assess  General Comments       Exercises     Shoulder Instructions      Home Living Family/patient expects to be discharged to:: Private residence Living Arrangements: Children Available Help at Discharge: Family Type of Home: House Home Access: Stairs to enter Technical brewer of Steps: 6 Entrance  Stairs-Rails: None Home Layout: One level     Bathroom Shower/Tub: Occupational psychologist: Standard Bathroom Accessibility: Yes   Home Equipment: Shower seat;Bedside commode;Other (comment);Walker - 4 wheels   Additional Comments: confirmed by patient       Prior Functioning/Environment Level of Independence: Independent with assistive device(s);Needs assistance    ADL's / Homemaking Assistance Needed: Independent with dressing and bathing. Assistance for cooking, cleaning and laundry. Was still driving.            OT Problem List: Impaired balance (sitting and/or standing);Decreased cognition;Decreased safety awareness      OT Treatment/Interventions: Self-care/ADL training;Therapeutic exercise;Neuromuscular education;Energy conservation;DME and/or AE instruction;Therapeutic activities;Cognitive remediation/compensation;Patient/family education;Balance training    OT Goals(Current goals can be found in the care plan section) Acute Rehab OT Goals Patient Stated Goal: to get a warm blanket and socks OT Goal Formulation: With patient Time For Goal Achievement: 03/21/19 Potential to Achieve Goals: Good  OT Frequency: Min 3X/week   Barriers to D/C:            Co-evaluation              AM-PAC OT "6 Clicks" Daily Activity     Outcome Measure Help from another person eating meals?: None Help from another person taking care of personal grooming?: A Little Help from another person toileting, which includes using toliet, bedpan, or urinal?: A Little Help from another person bathing (including washing, rinsing, drying)?: A Little Help from another person to put on and taking off regular upper body clothing?: A Little Help from another person to put on and taking off regular lower body clothing?: A Little 6 Click Score: 19   End of Session Nurse Communication: Mobility status;Precautions  Activity Tolerance: Patient tolerated treatment well Patient left: in  bed;with call bell/phone within reach;with bed alarm set  OT Visit Diagnosis: Unsteadiness on feet (R26.81);Muscle weakness (generalized) (M62.81)                Time: [id number] OT Time Calculation (min): 20 min Charges:  OT General Charges $OT Visit: 1 Visit OT Evaluation $OT Eval Moderate Complexity: 1 Mod   Jeri Modena, OTR/L  Acute Rehabilitation Services Pager: [phone] Office: [phone] .   Jeri Modena 03/07/2019, 3:36 PM

## 2019-03-07 NOTE — Evaluation (Signed)
Speech Language Pathology Evaluation Patient Details Name: [name] MRN: [id number] DOB: January 19, 1941 Today's Date: 03/07/2019 Time: [id number] SLP Time Calculation (min) (ACUTE ONLY): 8 min  Problem List:  Patient Active Problem List   Diagnosis Date Noted  . Stroke ([place]) 03/07/2019  . S/P left knee arthroscopy 04/18/18 06/06/2018  . Acute medial meniscus tear, left, subsequent encounter 04/18/2018  . Hypokalemia 04/15/2018  . Syncope and collapse 04/15/2018  . Pneumonia 02/18/2018  . Bilateral pleural effusion   . Generalized postprandial abdominal pain   . CAP (community acquired pneumonia) 02/15/2018  . Orthostatic hypotension 12/21/2017  . Peripheral neuropathy 12/21/2017  . CAD (coronary artery disease) 12/19/2017  . Long term (current) use of anticoagulants 03/14/2017  . Numbness of face 01/09/2017  . Unstable angina ([place]) 11/02/2016  . Other fatigue 11/02/2016  . B12 deficiency 10/16/2016  . Preoperative cardiovascular examination 09/21/2016  . Hyperglycemia 06/26/2016  . Atrial fibrillation with RVR ([place]) 06/23/2016  . Encounter for medication review and counseling 06/07/2016  . Essential hypertension, benign 04/19/2016  . Aortic valve stenosis 09/28/2015  . GE junction carcinoma ([place]) 05/14/2015  . [place] lesion, acute   . IDA (iron deficiency anemia)   . Mixed hyperlipidemia 05/05/2015  . GERD (gastroesophageal reflux disease) 05/05/2015  . Absolute anemia   . Anticoagulated on Coumadin   . Osteoarthritis 04/16/2015  . History of pulmonary embolism 03/02/2015  . Shortness of breath 02/22/2015  . Benign neoplasm of descending colon 02/17/2015  . Diverticulosis of large intestine without diverticulitis 02/17/2015  . Esophageal stricture 02/16/2015  . UGI bleed 02/13/2015  . Supratherapeutic INR 02/13/2015  . Thoracic back pain 10/15/2014  . Macular degeneration 09/01/2014  . Benign paroxysmal positional vertigo   . Uncontrolled hypertension 08/24/2014   . Complaints of total body pain 08/03/2014  . Sleep disturbance 08/03/2014  . PAF (paroxysmal atrial fibrillation) ([place]) 01/21/2014  . Long term current use of anticoagulant therapy 01/21/2014  . Chronic diastolic congestive heart failure ([place]) 01/21/2014  . Chest pain 01/21/2014   Past Medical History:  Past Medical History:  Diagnosis Date  . Anemia 2005   Generally microcytic, transfusions in [address], 2012, 02/2015, 05/2015  . Aortic stenosis    Moderate November 2017  . Arthritis   . Broken back 2013   Chronic back pain.   [name] CAD (coronary artery disease)    Cardiac catheterization 2014 - 80% mid RCA and 70% OM managed medically  . CHF (congestive heart failure) ([place])   . Chronic bilateral pleural effusions   . Chronic headache   . Contrast media allergy   . Diastolic heart failure ([place])   . Esophageal cancer ([place]) 05/06/2015   Adenocarcinoma GE junction  . Facial paresthesia   . GERD (gastroesophageal reflux disease)   . H. pylori infection   . H/O iron deficiency    05-06-15 iron infusion ([facility])  . HH (hiatus hernia) 2008   Large with associated erosions  . Hypertension   . Paroxysmal atrial fibrillation ([name])   . PONV (postoperative nausea and vomiting)   . Pulmonary emboli ([place]) 2008, 2012  . Stroke [facility]) 1982   Past Surgical History:  Past Surgical History:  Procedure Laterality Date  . APPENDECTOMY  1950s  . BACK SURGERY    . CARPAL TUNNEL RELEASE Bilateral 1990s  . [place] SURGERY  2014  . CHOLECYSTECTOMY  1980s   open  . COLONOSCOPY N/A 02/17/2015   Procedure: COLONOSCOPY;  Surgeon: [name], MD;  Location: [facility] ENDOSCOPY;  Service: Endoscopy;  Laterality: N/A;  . ESOPHAGOGASTRODUODENOSCOPY N/A 02/16/2015   Procedure: ESOPHAGOGASTRODUODENOSCOPY (EGD);  Surgeon: [name], MD;  Location: [facility];  Service: Endoscopy;  Laterality: N/A;  . ESOPHAGOGASTRODUODENOSCOPY N/A 05/06/2015   Procedure: ESOPHAGOGASTRODUODENOSCOPY (EGD);  Surgeon:  [name], MD;  Location: [facility] ENDOSCOPY;  Service: Endoscopy;  Laterality: N/A;  . EUS N/A 05/20/2015   Procedure: UPPER ENDOSCOPIC ULTRASOUND (EUS) LINEAR;  Surgeon: [name], MD;  Location: [facility] ENDOSCOPY;  Service: Endoscopy;  Laterality: N/A;  . exploratory lab  1950s or 1960s  . GIVENS CAPSULE STUDY N/A 05/06/2015   Procedure: GIVENS CAPSULE STUDY;  Surgeon: [name], MD;  Location: [facility] ENDOSCOPY;  Service: Endoscopy;  Laterality: N/A;  . KNEE ARTHROSCOPY WITH MEDIAL MENISECTOMY Left 04/18/2018   Procedure: LEFT KNEE ARTHROSCOPY WITH PARTIAL MEDIAL MENISECTOMY AND ANTERIOR CRUCIATE LIGAMENT DEBRIDEMENT;  Surgeon: [name], MD;  Location: [facility] ORS;  Service: Orthopedics;  Laterality: Left;  . LEFT HEART CATH AND CORONARY ANGIOGRAPHY N/A 11/08/2016   Procedure: Left Heart Cath and Coronary Angiography;  Surgeon: [name], MD;  Location: [facility] CV LAB;  Service: Cardiovascular;  Laterality: N/A;  . lumbar back surgery  2012  . [place] SURGERY  1991  . TONSILLECTOMY    . TONSILLECTOMY AND ADENOIDECTOMY  1960s   HPI:  [name] is a 78 y.o. female with past medical history significant for chronic congestive heart failure, coronary artery disease, paroxysmal atrial fibrillation on Coumadin, PE, prior stroke, moderate aortic stenosis, hypertension presented to [facility] emergency department for sudden onset slurred speech as well as left facial droop. MRI reveals acute right M2 occlusion, consistent with the right MCA territory infarct and moderate atherosclerotic change elsewhere throughout the intracranial circulation.    Assessment / Plan / Recommendation Clinical Impression  Pt presents with severe dysarthria c/b left facial flaccidity resulting in imprecise articulation. Pt's speech intelligibility is greatly reduced resulting in ~ 25% intelligibility at the word/phrase level. Pt's speech intelligibility negatively impacts her ability to  communicate wants and needs. ST to follow for speech therapy.     SLP Assessment  SLP Recommendation/Assessment: Patient needs continued Speech Lanaguage Pathology Services SLP Visit Diagnosis: Dysarthria and anarthria (R47.1)    Follow Up Recommendations  (TBD)    Frequency and Duration min 2x/week  2 weeks      SLP Evaluation Cognition  Overall Cognitive Status: Difficult to assess Arousal/Alertness: Awake/alert Orientation Level: Oriented X4       Comprehension  Auditory Comprehension Overall Auditory Comprehension: Appears within functional limits for tasks assessed Visual Recognition/Discrimination Discrimination: Not tested Reading Comprehension Reading Status: Not tested    Expression Expression Primary Mode of Expression: Verbal Verbal Expression Overall Verbal Expression: Appears within functional limits for tasks assessed Written Expression Dominant Hand: Right Written Expression: Not tested   Oral / Motor  Oral Motor/Sensory Function Overall Oral Motor/Sensory Function: Moderate impairment Facial ROM: Reduced left Facial Symmetry: Abnormal symmetry left Facial Strength: Reduced left Facial Sensation: Reduced left Lingual ROM: Reduced left Lingual Symmetry: Abnormal symmetry left;Suspected CN XII (hypoglossal) dysfunction Lingual Strength: Reduced Lingual Sensation: Reduced;Suspected CN VII (facial) dysfunction-anterior 2/3 tongue Mandible: Impaired Motor Speech Overall Motor Speech: Impaired Respiration: Within functional limits Phonation: Normal Resonance: Within functional limits Articulation: Impaired Level of Impairment: Word Intelligibility: Intelligibility reduced Word: 25-49% accurate Phrase: 0-24% accurate Sentence: 0-24% accurate Conversation: Not tested Motor Planning: Impaired Level of Impairment: Word Motor Speech Errors: Aware;Consistent Effective Techniques: Over-articulate   GO  [place] Morren 03/07/2019,  3:43 PM

## 2019-03-07 NOTE — Progress Notes (Signed)
  Echocardiogram 2D Echocardiogram has been performed.  [name] 03/07/2019, 3:58 PM

## 2019-03-07 NOTE — Progress Notes (Addendum)
Requesting Physician: Dr. Melina Copa    Chief Complaint: Slurred speech  History obtained from: Patient and Chart    HPI:                                                                                                                                       [name] is a 78 y.o. female with past medical history significant for chronic congestive heart failure, coronary artery disease, paroxysmal atrial fibrillation on Coumadin, PE, prior stroke, moderate aortic stenosis, hypertension presented to [facility] emergency department for sudden onset slurred speech as well as left facial droop.  She states her symptoms started around 6 PM.  She was evaluated by tele neurology, however did not receive TPA because patient on Coumadin with INR of 1.6.  Her NIH stroke scale was 2, for mild facial droop and slurred speech.  The neurologist wanted a stat CTA to rule out large vessel occlusion however not possible due to contrast allergy.  Patient was requested to be emergently transferred to [facility]: ER for stat MRI MRA brain.  Patient did arrive significantly later daily transfer issues, however on evaluation is negative for LVO.   Date last known well: 9.3.20 Time last known well: 6 PM tPA Given: No, coagulopathy NIHSS: 2 Baseline MRS 0-1     Past Medical History:  Diagnosis Date  . Anemia 2005   Generally microcytic, transfusions in [address], 2012, 02/2015, 05/2015  . Aortic stenosis    Moderate November 2017  . Arthritis   . Broken back 2013   Chronic back pain.   [name] CAD (coronary artery disease)    Cardiac catheterization 2014 - 80% mid RCA and 70% OM managed medically  . CHF (congestive heart failure) ([place])   . Chronic bilateral pleural effusions   . Chronic headache   . Contrast media allergy   . Diastolic heart failure ([place])   . Esophageal cancer ([place]) 05/06/2015   Adenocarcinoma GE junction  . Facial paresthesia   . GERD (gastroesophageal reflux disease)   . H. pylori  infection   . H/O iron deficiency    05-06-15 iron infusion ([facility])  . HH (hiatus hernia) 2008   Large with associated erosions  . Hypertension   . Paroxysmal atrial fibrillation ([name])   . PONV (postoperative nausea and vomiting)   . Pulmonary emboli ([place]) 2008, 2012  . Stroke [facility]) 1982    Past Surgical History:  Procedure Laterality Date  . APPENDECTOMY  1950s  . BACK SURGERY    . CARPAL TUNNEL RELEASE Bilateral 1990s  . [place] SURGERY  2014  . CHOLECYSTECTOMY  1980s   open  . COLONOSCOPY N/A 02/17/2015   Procedure: COLONOSCOPY;  Surgeon: [name], MD;  Location: [facility] ENDOSCOPY;  Service: Endoscopy;  Laterality: N/A;  . ESOPHAGOGASTRODUODENOSCOPY N/A 02/16/2015   Procedure: ESOPHAGOGASTRODUODENOSCOPY (EGD);  Surgeon: [name], MD;  Location: [facility] ENDOSCOPY;  Service: Endoscopy;  Laterality: N/A;  . ESOPHAGOGASTRODUODENOSCOPY N/A 05/06/2015   Procedure: ESOPHAGOGASTRODUODENOSCOPY (EGD);  Surgeon: [name], MD;  Location: [facility] ENDOSCOPY;  Service: Endoscopy;  Laterality: N/A;  . EUS N/A 05/20/2015   Procedure: UPPER ENDOSCOPIC ULTRASOUND (EUS) LINEAR;  Surgeon: [name], MD;  Location: [facility] ENDOSCOPY;  Service: Endoscopy;  Laterality: N/A;  . exploratory lab  1950s or 1960s  . GIVENS CAPSULE STUDY N/A 05/06/2015   Procedure: GIVENS CAPSULE STUDY;  Surgeon: [name], MD;  Location[facility] ENDOSCOPY;  Service: Endoscopy;  Laterality: N/A;  . KNEE ARTHROSCOPY WITH MEDIAL MENISECTOMY Left 04/18/2018   Procedure: LEFT KNEE ARTHROSCOPY WITH PARTIAL MEDIAL MENISECTOMY AND ANTERIOR CRUCIATE LIGAMENT DEBRIDEMENT;  Surgeon: [name], MD;  Location: [facility] ORS;  Service: Orthopedics;  Laterality: Left;  . LEFT HEART CATH AND CORONARY ANGIOGRAPHY N/A 11/08/2016   Procedure: Left Heart Cath and Coronary Angiography;  Surgeon: [name], MD;  Location: [place] CV LAB;  Service: Cardiovascular;  Laterality: N/A;  . lumbar back surgery  2012  . [place]  SURGERY  1991  . TONSILLECTOMY    . TONSILLECTOMY AND ADENOIDECTOMY  1960s    Family History  Problem Relation Age of Onset  . Stroke Mother   . Heart disease Mother   . Emphysema Father   . Ovarian cancer Sister   . Stroke Sister   . Other Child        died at birth   Social History:  reports that she has never smoked. She has never used smokeless tobacco. She reports that she does not drink alcohol or use drugs.  Allergies:  Allergies  Allergen Reactions  . Iodinated Diagnostic Agents Anaphylaxis and Other (See Comments)    IPD dye Info given by patient  . Ioxaglate Anaphylaxis and Other (See Comments)    Info given by patient  . Red Dye Anaphylaxis  . Lactose Intolerance ([place])   . Whey Other (See Comments)    Lactose intolerance  . Darvon [Propoxyphene] Rash  . Hydralazine Anxiety and Other (See Comments)    Facial flushing, pt prefers not to use it.   . Milk-Related Compounds Other (See Comments)    Lactose intolerance Can tolerate milk if its cooked into the recipe, just can't drink milk     Medications:                                                                                                                        I reviewed home medications.  Patient on Coumadin.   ROS:  14 systems reviewed and negative except above    Examination:                                                                                                      General: Appears well-developed  Psych: Affect appropriate to situation Eyes: No scleral injection HENT: No OP obstrucion Head: Normocephalic.  Cardiovascular: Normal rate and regular rhythm. Respiratory: Effort normal and breath sounds normal to anterior ascultation GI: Soft.  No distension. There is no tenderness.  Skin: WDI    Neurological Examination Mental Status: Alert, oriented,  thought content appropriate. Able to follow 3 step commands without difficulty.  Moderate dysarthria, slow speech Cranial Nerves: II: Visual fields grossly normal,  III,IV, VI: ptosis not present, extra-ocular motions intact bilaterally, pupils equal, round, reactive to light and accommodation V,VII: smile symmetric, facial light touch sensation normal bilaterally VIII: hearing normal bilaterally IX,X: uvula rises symmetrically XI: bilateral shoulder shrug XII: midline tongue extension Motor: Right : Upper extremity   5/5    Left:     Upper extremity   5/5  Lower extremity   5/5     Lower extremity   5/5 Tone and bulk:normal tone throughout; no atrophy noted Sensory: Pinprick and light touch intact throughout, bilaterally Deep Tendon Reflexes: 2+ over both patella  Plantars: Right: downgoing   Left: downgoing Cerebellar: normal finger-to-nose, normal rapid alternating movements and normal heel-to-shin test      Lab Results: Basic Metabolic Panel: Recent Labs  Lab 03/06/19 10-20-2130 03/06/19 2137  NA 139 140  K 4.1 4.0  CL 102 100  CO2 30  --   GLUCOSE 174* 168*  BUN 13 14  CREATININE 0.90 1.00  CALCIUM 9.0  --     CBC: Recent Labs  Lab 03/06/19 2133 03/06/19 2137  WBC 6.5  --   NEUTROABS 3.9  --   HGB 14.2 14.6  HCT 42.6 43.0  MCV 87.5  --   PLT 171  --     Coagulation Studies: Recent Labs    03/06/19 10/20/2130  LABPROT 18.5*  INR 1.6*    Imaging: Mr Angio Head Wo Contrast  Result Date: 03/07/2019 CLINICAL DATA:  Initial evaluation for acute speech difficulty, stroke-like symptoms. EXAM: MRI HEAD WITHOUT CONTRAST MRA HEAD WITHOUT CONTRAST MRA NECK WITHOUT CONTRAST TECHNIQUE: Multiplanar, multiecho pulse sequences of the brain and surrounding structures were obtained without intravenous contrast. Angiographic images of the Circle of Willis were obtained using MRA technique without intravenous contrast. Angiographic images of the neck were obtained using MRA  technique without intravenous contrast. Carotid stenosis measurements (when applicable) are obtained utilizing NASCET criteria, using the distal internal carotid diameter as the denominator. COMPARISON:  Prior head CT from 03/06/2019. FINDINGS: MRI HEAD FINDINGS Brain: Generalized age-related cerebral atrophy. Patchy and confluent T2/FLAIR hyperintensity within the periventricular and deep white matter both cerebral hemispheres most consistent with chronic small vessel ischemic disease, mild in nature. Several scattered superimposed remote lacunar infarcts seen involving the right basal ganglia and hemispheric cerebral white matter. Chronic superior right cerebellar infarct noted. Patchy small volume restricted  diffusion seen involving the cortical and subcortical right frontal operculum, consistent with acute right MCA territory infarct. No associated hemorrhage or mass effect. No other evidence for acute or subacute ischemia. No acute intracranial hemorrhage. No mass lesion, midline shift or mass effect. No hydrocephalus. No extra-axial fluid collection. Pituitary gland suprasellar region normal. Midline structures intact. Vascular: Focus of susceptibility artifact seen at the inferior right sylvian fissure, suspected to reflect thrombus in a right M2 branch, seen on corresponding MRA (series 18, image 28). Major intracranial vascular flow voids are otherwise maintained. Skull and upper cervical spine: Craniocervical junction within normal limits. Postsurgical changes partially visualize within the upper cervical spine. Bone marrow signal intensity normal. No scalp soft tissue abnormality. Sinuses/Orbits: Patient status post bilateral ocular lens replacement. Paranasal sinuses are clear. No significant mastoid effusion. Inner ear structures normal. Other: None. MRA HEAD FINDINGS ANTERIOR CIRCULATION: Distal cervical segments of the internal carotid arteries are patent with symmetric antegrade flow. Petrous  segments patent bilaterally. Scattered atheromatous irregularity within the cavernous/supraclinoid ICAs without high-grade stenosis, slightly worse on the right. A1 segments patent bilaterally. Normal anterior communicating artery. Anterior cerebral arteries widely patent to their distal aspects without stenosis. Right M1 widely patent. Normal right MCA bifurcation. There is focal occlusion of a proximal right M2 branch at the base of the sylvian fissure, corresponding with susceptibility artifacts seen on corresponding MRI (series 7, image 110). Right MCA branches otherwise perfused. Left M1 patent. Normal left MCA bifurcation. Distal left MCA branches well perfused. POSTERIOR CIRCULATION: Mild atheromatous irregularity within the vertebral arteries bilaterally without flow-limiting stenosis. Left vertebral artery dominant. Posterior inferior cerebral arteries patent bilaterally. Basilar patent to its distal aspect without flow-limiting stenosis. Superior cerebral arteries patent bilaterally. Hypoplastic P1 segments with robust bilateral posterior communicating arteries. Scattered atheromatous irregularity throughout the PCAs without high-grade flow-limiting stenosis. Distal small vessel atheromatous irregularity seen throughout the intracranial circulation. No intracranial aneurysm. MRA NECK FINDINGS AORTIC ARCH: Visualized aortic arch of normal caliber with normal 3 vessel morphology. No hemodynamically significant stenosis seen about the origin of the great vessels. Visualized subclavian arteries widely patent. RIGHT CAROTID SYSTEM: Right common carotid artery widely patent from its origin to the bifurcation. Mild atheromatous irregularity about the right bifurcation and proximal right ICA without hemodynamically significant stenosis. Right ICA widely patent distally to the skull base without stenosis or occlusion. LEFT CAROTID SYSTEM: Left common carotid artery widely patent from its origin to the bifurcation  without stenosis. Mild atheromatous irregularity about the left bifurcation/proximal left ICA without hemodynamically significant stenosis. Left ICA widely patent distally to the skull base without stenosis or occlusion. VERTEBRAL ARTERIES: Both vertebral arteries arise from the subclavian arteries. Vertebral arteries not well assessed proximally. Left vertebral artery dominant. Visualized vertebral arteries widely patent within the neck without stenosis or occlusion. IMPRESSION: MRI HEAD IMPRESSION: 1. Patchy small volume acute ischemic nonhemorrhagic right MCA territory infarct involving the right frontal operculum. 2. Underlying moderate chronic microvascular ischemic disease with scattered remote lacunar infarcts within the right basal ganglia and hemispheric cerebral white matter. 3. Superimposed chronic superior right cerebellar infarct. MRA HEAD IMPRESSION: 1. Acute right M2 occlusion, consistent with the right MCA territory infarct. 2. Moderate atherosclerotic change elsewhere throughout the intracranial circulation. No other hemodynamically significant or correctable stenosis. MRA NECK IMPRESSION: 1. Mild atheromatous irregularity about the carotid bifurcations/proximal ICAs bilaterally without hemodynamically significant or critical flow limiting stenosis. Both carotid artery systems otherwise widely patent within the neck. 2. Wide patency of the vertebral arteries within the neck.  Left vertebral artery dominant Electronically Signed   By: Jeannine Boga M.D.   On: 03/07/2019 05:24   Mr Angio Neck Wo Contrast  Result Date: 03/07/2019 CLINICAL DATA:  Initial evaluation for acute speech difficulty, stroke-like symptoms. EXAM: MRI HEAD WITHOUT CONTRAST MRA HEAD WITHOUT CONTRAST MRA NECK WITHOUT CONTRAST TECHNIQUE: Multiplanar, multiecho pulse sequences of the brain and surrounding structures were obtained without intravenous contrast. Angiographic images of the Circle of Willis were obtained using  MRA technique without intravenous contrast. Angiographic images of the neck were obtained using MRA technique without intravenous contrast. Carotid stenosis measurements (when applicable) are obtained utilizing NASCET criteria, using the distal internal carotid diameter as the denominator. COMPARISON:  Prior head CT from 03/06/2019. FINDINGS: MRI HEAD FINDINGS Brain: Generalized age-related cerebral atrophy. Patchy and confluent T2/FLAIR hyperintensity within the periventricular and deep white matter both cerebral hemispheres most consistent with chronic small vessel ischemic disease, mild in nature. Several scattered superimposed remote lacunar infarcts seen involving the right basal ganglia and hemispheric cerebral white matter. Chronic superior right cerebellar infarct noted. Patchy small volume restricted diffusion seen involving the cortical and subcortical right frontal operculum, consistent with acute right MCA territory infarct. No associated hemorrhage or mass effect. No other evidence for acute or subacute ischemia. No acute intracranial hemorrhage. No mass lesion, midline shift or mass effect. No hydrocephalus. No extra-axial fluid collection. Pituitary gland suprasellar region normal. Midline structures intact. Vascular: Focus of susceptibility artifact seen at the inferior right sylvian fissure, suspected to reflect thrombus in a right M2 branch, seen on corresponding MRA (series 18, image 28). Major intracranial vascular flow voids are otherwise maintained. Skull and upper cervical spine: Craniocervical junction within normal limits. Postsurgical changes partially visualize within the upper cervical spine. Bone marrow signal intensity normal. No scalp soft tissue abnormality. Sinuses/Orbits: Patient status post bilateral ocular lens replacement. Paranasal sinuses are clear. No significant mastoid effusion. Inner ear structures normal. Other: None. MRA HEAD FINDINGS ANTERIOR CIRCULATION: Distal cervical  segments of the internal carotid arteries are patent with symmetric antegrade flow. Petrous segments patent bilaterally. Scattered atheromatous irregularity within the cavernous/supraclinoid ICAs without high-grade stenosis, slightly worse on the right. A1 segments patent bilaterally. Normal anterior communicating artery. Anterior cerebral arteries widely patent to their distal aspects without stenosis. Right M1 widely patent. Normal right MCA bifurcation. There is focal occlusion of a proximal right M2 branch at the base of the sylvian fissure, corresponding with susceptibility artifacts seen on corresponding MRI (series 7, image 110). Right MCA branches otherwise perfused. Left M1 patent. Normal left MCA bifurcation. Distal left MCA branches well perfused. POSTERIOR CIRCULATION: Mild atheromatous irregularity within the vertebral arteries bilaterally without flow-limiting stenosis. Left vertebral artery dominant. Posterior inferior cerebral arteries patent bilaterally. Basilar patent to its distal aspect without flow-limiting stenosis. Superior cerebral arteries patent bilaterally. Hypoplastic P1 segments with robust bilateral posterior communicating arteries. Scattered atheromatous irregularity throughout the PCAs without high-grade flow-limiting stenosis. Distal small vessel atheromatous irregularity seen throughout the intracranial circulation. No intracranial aneurysm. MRA NECK FINDINGS AORTIC ARCH: Visualized aortic arch of normal caliber with normal 3 vessel morphology. No hemodynamically significant stenosis seen about the origin of the great vessels. Visualized subclavian arteries widely patent. RIGHT CAROTID SYSTEM: Right common carotid artery widely patent from its origin to the bifurcation. Mild atheromatous irregularity about the right bifurcation and proximal right ICA without hemodynamically significant stenosis. Right ICA widely patent distally to the skull base without stenosis or occlusion. LEFT  CAROTID SYSTEM: Left common carotid artery widely patent from  its origin to the bifurcation without stenosis. Mild atheromatous irregularity about the left bifurcation/proximal left ICA without hemodynamically significant stenosis. Left ICA widely patent distally to the skull base without stenosis or occlusion. VERTEBRAL ARTERIES: Both vertebral arteries arise from the subclavian arteries. Vertebral arteries not well assessed proximally. Left vertebral artery dominant. Visualized vertebral arteries widely patent within the neck without stenosis or occlusion. IMPRESSION: MRI HEAD IMPRESSION: 1. Patchy small volume acute ischemic nonhemorrhagic right MCA territory infarct involving the right frontal operculum. 2. Underlying moderate chronic microvascular ischemic disease with scattered remote lacunar infarcts within the right basal ganglia and hemispheric cerebral white matter. 3. Superimposed chronic superior right cerebellar infarct. MRA HEAD IMPRESSION: 1. Acute right M2 occlusion, consistent with the right MCA territory infarct. 2. Moderate atherosclerotic change elsewhere throughout the intracranial circulation. No other hemodynamically significant or correctable stenosis. MRA NECK IMPRESSION: 1. Mild atheromatous irregularity about the carotid bifurcations/proximal ICAs bilaterally without hemodynamically significant or critical flow limiting stenosis. Both carotid artery systems otherwise widely patent within the neck. 2. Wide patency of the vertebral arteries within the neck. Left vertebral artery dominant Electronically Signed   By: Jeannine Boga M.D.   On: 03/07/2019 05:24   Mr Brain Wo Contrast  Result Date: 03/07/2019 CLINICAL DATA:  Initial evaluation for acute speech difficulty, stroke-like symptoms. EXAM: MRI HEAD WITHOUT CONTRAST MRA HEAD WITHOUT CONTRAST MRA NECK WITHOUT CONTRAST TECHNIQUE: Multiplanar, multiecho pulse sequences of the brain and surrounding structures were obtained without  intravenous contrast. Angiographic images of the Circle of Willis were obtained using MRA technique without intravenous contrast. Angiographic images of the neck were obtained using MRA technique without intravenous contrast. Carotid stenosis measurements (when applicable) are obtained utilizing NASCET criteria, using the distal internal carotid diameter as the denominator. COMPARISON:  Prior head CT from 03/06/2019. FINDINGS: MRI HEAD FINDINGS Brain: Generalized age-related cerebral atrophy. Patchy and confluent T2/FLAIR hyperintensity within the periventricular and deep white matter both cerebral hemispheres most consistent with chronic small vessel ischemic disease, mild in nature. Several scattered superimposed remote lacunar infarcts seen involving the right basal ganglia and hemispheric cerebral white matter. Chronic superior right cerebellar infarct noted. Patchy small volume restricted diffusion seen involving the cortical and subcortical right frontal operculum, consistent with acute right MCA territory infarct. No associated hemorrhage or mass effect. No other evidence for acute or subacute ischemia. No acute intracranial hemorrhage. No mass lesion, midline shift or mass effect. No hydrocephalus. No extra-axial fluid collection. Pituitary gland suprasellar region normal. Midline structures intact. Vascular: Focus of susceptibility artifact seen at the inferior right sylvian fissure, suspected to reflect thrombus in a right M2 branch, seen on corresponding MRA (series 18, image 28). Major intracranial vascular flow voids are otherwise maintained. Skull and upper cervical spine: Craniocervical junction within normal limits. Postsurgical changes partially visualize within the upper cervical spine. Bone marrow signal intensity normal. No scalp soft tissue abnormality. Sinuses/Orbits: Patient status post bilateral ocular lens replacement. Paranasal sinuses are clear. No significant mastoid effusion. Inner ear  structures normal. Other: None. MRA HEAD FINDINGS ANTERIOR CIRCULATION: Distal cervical segments of the internal carotid arteries are patent with symmetric antegrade flow. Petrous segments patent bilaterally. Scattered atheromatous irregularity within the cavernous/supraclinoid ICAs without high-grade stenosis, slightly worse on the right. A1 segments patent bilaterally. Normal anterior communicating artery. Anterior cerebral arteries widely patent to their distal aspects without stenosis. Right M1 widely patent. Normal right MCA bifurcation. There is focal occlusion of a proximal right M2 branch at the base of the sylvian fissure, corresponding  with susceptibility artifacts seen on corresponding MRI (series 7, image 110). Right MCA branches otherwise perfused. Left M1 patent. Normal left MCA bifurcation. Distal left MCA branches well perfused. POSTERIOR CIRCULATION: Mild atheromatous irregularity within the vertebral arteries bilaterally without flow-limiting stenosis. Left vertebral artery dominant. Posterior inferior cerebral arteries patent bilaterally. Basilar patent to its distal aspect without flow-limiting stenosis. Superior cerebral arteries patent bilaterally. Hypoplastic P1 segments with robust bilateral posterior communicating arteries. Scattered atheromatous irregularity throughout the PCAs without high-grade flow-limiting stenosis. Distal small vessel atheromatous irregularity seen throughout the intracranial circulation. No intracranial aneurysm. MRA NECK FINDINGS AORTIC ARCH: Visualized aortic arch of normal caliber with normal 3 vessel morphology. No hemodynamically significant stenosis seen about the origin of the great vessels. Visualized subclavian arteries widely patent. RIGHT CAROTID SYSTEM: Right common carotid artery widely patent from its origin to the bifurcation. Mild atheromatous irregularity about the right bifurcation and proximal right ICA without hemodynamically significant stenosis.  Right ICA widely patent distally to the skull base without stenosis or occlusion. LEFT CAROTID SYSTEM: Left common carotid artery widely patent from its origin to the bifurcation without stenosis. Mild atheromatous irregularity about the left bifurcation/proximal left ICA without hemodynamically significant stenosis. Left ICA widely patent distally to the skull base without stenosis or occlusion. VERTEBRAL ARTERIES: Both vertebral arteries arise from the subclavian arteries. Vertebral arteries not well assessed proximally. Left vertebral artery dominant. Visualized vertebral arteries widely patent within the neck without stenosis or occlusion. IMPRESSION: MRI HEAD IMPRESSION: 1. Patchy small volume acute ischemic nonhemorrhagic right MCA territory infarct involving the right frontal operculum. 2. Underlying moderate chronic microvascular ischemic disease with scattered remote lacunar infarcts within the right basal ganglia and hemispheric cerebral white matter. 3. Superimposed chronic superior right cerebellar infarct. MRA HEAD IMPRESSION: 1. Acute right M2 occlusion, consistent with the right MCA territory infarct. 2. Moderate atherosclerotic change elsewhere throughout the intracranial circulation. No other hemodynamically significant or correctable stenosis. MRA NECK IMPRESSION: 1. Mild atheromatous irregularity about the carotid bifurcations/proximal ICAs bilaterally without hemodynamically significant or critical flow limiting stenosis. Both carotid artery systems otherwise widely patent within the neck. 2. Wide patency of the vertebral arteries within the neck. Left vertebral artery dominant Electronically Signed   By: Jeannine Boga M.D.   On: 03/07/2019 05:24   Ct Head Code Stroke Wo Contrast  Result Date: 03/06/2019 CLINICAL DATA:  Code stroke. Slurred speech. Left-sided facial droop. Symptoms began 1900 hours. Headache. EXAM: CT HEAD WITHOUT CONTRAST TECHNIQUE: Contiguous axial images were  obtained from the base of the skull through the vertex without intravenous contrast. COMPARISON:  MRI 12/20/2017.  CT 12/19/2017. FINDINGS: Brain: Old infarctions affect the right cerebellum. Cerebral hemispheres show early cortical low-density in the right frontal operculum. There is old infarction in the radiating white matter tracts on the right. No mass lesion, hemorrhage, hydrocephalus or extra-axial collection. Vascular: There is atherosclerotic calcification of the major vessels at the base of the brain. Skull: Normal Sinuses/Orbits: Clear/normal Other: None ASPECTS ([place] Stroke Program Early CT Score) - Ganglionic level infarction (caudate, lentiform nuclei, internal capsule, insula, M1-M3 cortex): 6 - Supraganglionic infarction (M4-M6 cortex): 3 Total score (0-10 with 10 being normal): 9 IMPRESSION: 1. Question loss of gray-white differentiation in the right frontal operculum, M1 sector. No evidence of hemorrhage or mass effect. Old infarctions in the right cerebellum and right radiating white matter tracts. 2. ASPECTS is 9 3. These results were called by telephone at the time of interpretation on 03/06/2019 at 9:44 pm to Dr. Aletta Edouard ,  who verbally acknowledged these results. Electronically Signed   By: Nelson Chimes M.D.   On: 03/06/2019 21:47     ASSESSMENT AND PLAN   78 y.o. female with past medical history significant for chronic congestive heart failure, coronary artery disease, paroxysmal atrial fibrillation on Coumadin, PE, prior stroke, moderate aortic stenosis, hypertension presented to [facility] emergency department for sudden onset slurred speech as well as left facial droop.  CT head showed loss of gray-white matter differentiation in the right frontal operculum.  Transferred to [facility] ER for emergent vascular imaging.  On my assessment, clinically negative for LVO : No visual field deficits/neglect.  Also no longer has facial droop.  Does have moderate  dysarthria.   Acute Ischemic Stroke   Risk factors : chronic congestive heart failure, coronary artery disease, paroxysmal atrial fibrillation on Coumadin, PE, prior stroke, moderate aortic stenosis, hypertension  Etiology: Likely cardioembolic given history of A. fib and subtherapeutic INR  Recommend # MRI of the brain without contrast #MRA Head and neck  #Transthoracic Echo  #Continue Coumadin,consider switching to Eliquis for her Coumadin failure #Start or continue Atorvastatin 40 mg/other high intensity statin # BP goal: permissive HTN upto 220/120 mmHg ( 185/110 if patient has CHF, CKD) # HBAIC and Lipid profile # Telemetry monitoring # Frequent neuro checks # NPO until passes stroke swallow screen  Please page stroke NP  Or  PA  Or MD from 8am -4 pm  as this patient from this time will be  followed by the stroke.   You can look them up on [email or web address]  Password [email or web address]   [name] [facility] Pager Number [phone]   Addendum :  MRA read as proximal right M2 occlusion (difficult to visualize on my read), does have good collaterals and perfusion over the right MCA.  MRI brain shows a patchy  right MCA infarct in the frontal operculum. Her deficits are mild and she is not a candidate for mechanical thrombectomy unless there is a significant worsening in her neurological exam.

## 2019-03-07 NOTE — Evaluation (Signed)
Physical Therapy Evaluation Patient Details Name: [name] MRN: [id number] DOB: [birth year] Today's Date: 03/07/2019   History of Present Illness  Patient is a 78 year old female admitted from home with slurred speech and drooling.FOund to have MCA infarct. PMH to include: afib, HTN, CHF, CAD.  Clinical Impression  Patient received in bed, RN present. Patient agrees to PT evaluation. ROM and strength of B UEs & LEs appears WNL, Patient main concern is drooling and speech is slurred. Patient performs bed mobility with supervision and ambulated 30 feet with min guard assist, no AD. Patient will benefit from PT follow up to ensure she maintains currentl level of mobility, increase ambulation, and ensure she will be safe to return home at discharge.       Follow Up Recommendations Home health PT    Equipment Recommendations  None recommended by PT    Recommendations for Other Services       Precautions / Restrictions Precautions Precautions: Fall Restrictions Weight Bearing Restrictions: No      Mobility  Bed Mobility Overal bed mobility: Needs Assistance Bed Mobility: Supine to Sit     Supine to sit: Supervision        Transfers Overall transfer level: Needs assistance Equipment used: None Transfers: Sit to/from Stand              Ambulation/Gait Ambulation/Gait assistance: Supervision   Assistive device: None Gait Pattern/deviations: WFL(Within Functional Limits)     General Gait Details: appears to be at baseline.  Stairs            Wheelchair Mobility    Modified Rankin (Stroke Patients Only) Modified Rankin (Stroke Patients Only) Pre-Morbid Rankin Score: No symptoms Modified Rankin: Slight disability     Balance Overall balance assessment: Needs assistance Sitting-balance support: Feet supported Sitting balance-Leahy Scale: Good     Standing balance support: During functional activity Standing balance-Leahy Scale: Fair Standing  balance comment: min guard assist during ambulation                             Pertinent Vitals/Pain Pain Assessment: No/denies pain    Home Living Family/patient expects to be discharged to:: Private residence Living Arrangements: Children Available Help at Discharge: Family Type of Home: House Home Access: Stairs to enter   Technical brewer of Steps: 6 Home Layout: One level Home Equipment: Shower seat;Bedside commode;Other (comment);Walker - 4 wheels Additional Comments: unable to obtain home/prior functional information. Patient has slurred speech and no family present. information gathered from chart from prior admission.    Prior Function Level of Independence: Independent with assistive device(s);Needs assistance      ADL's / Homemaking Assistance Needed: Independent with dressing and bathing. Assistance for cooking, cleaning and laundry. Was still driving.        Hand Dominance        Extremity/Trunk Assessment   Upper Extremity Assessment Upper Extremity Assessment: Overall WFL for tasks assessed    Lower Extremity Assessment Lower Extremity Assessment: Overall WFL for tasks assessed    Cervical / Trunk Assessment Cervical / Trunk Assessment: Normal  Communication   Communication: Expressive difficulties  Cognition Arousal/Alertness: Awake/alert Behavior During Therapy: WFL for tasks assessed/performed Overall Cognitive Status: Difficult to assess  General Comments      Exercises     Assessment/Plan    PT Assessment Patient needs continued PT services  PT Problem List Decreased strength;Decreased mobility;Decreased activity tolerance;Decreased balance       PT Treatment Interventions Therapeutic activities;Gait training;Stair training;Balance training;Functional mobility training;Patient/family education;Therapeutic exercise    PT Goals (Current goals can be found in the  Care Plan section)  Acute Rehab PT Goals Patient Stated Goal: none stated PT Goal Formulation: With patient Time For Goal Achievement: 03/14/19 Potential to Achieve Goals: Fair    Frequency Min 2X/week   Barriers to discharge   unsure of support at home    Co-evaluation               AM-PAC PT "6 Clicks" Mobility  Outcome Measure Help needed turning from your back to your side while in a flat bed without using bedrails?: A Little Help needed moving from lying on your back to sitting on the side of a flat bed without using bedrails?: A Little Help needed moving to and from a bed to a chair (including a wheelchair)?: A Little Help needed standing up from a chair using your arms (e.g., wheelchair or bedside chair)?: A Little Help needed to walk in hospital room?: A Little Help needed climbing 3-5 steps with a railing? : A Little 6 Click Score: 18    End of Session Equipment Utilized During Treatment: Gait belt Activity Tolerance: Patient tolerated treatment well Patient left: in bed;with bed alarm set;with call bell/phone within reach Nurse Communication: Mobility status PT Visit Diagnosis: Muscle weakness (generalized) (M62.81);Unsteadiness on feet (R26.81);Other abnormalities of gait and mobility (R26.89)    Time: 1310-1325 PT Time Calculation (min) (ACUTE ONLY): 15 min   Charges:   PT Evaluation $PT Eval Moderate Complexity: 1 Mod          Commodore Bellew, PT, GCS 03/07/19,2:13 PM

## 2019-03-07 NOTE — ED Provider Notes (Signed)
78 year old female transferred from [facility] for MRI and MRA because of stroke symptoms.  There had been concern for large vessel occlusion, but she has renal insufficiency and family would not agree to using IV contrast.  As she does have a left facial droop but no obvious extremity weakness.  MRI and MRA have been ordered.  MRI shows evidence of right MCA stroke with occlusion of the right M2.  I have discussed the case with Dr. Lorraine Lax of neurology service who agrees to see the patient in consultation.  Case is discussed with Dr. Blaine Hamper of [facility], who agrees to admit the patient.  Results for orders placed or performed during the hospital encounter of 03/06/19  SARS Coronavirus 2 [facility] order, Performed in [facility] hospital lab) Nasopharyngeal Nasopharyngeal Swab   Specimen: Nasopharyngeal Swab  Result Value Ref Range   SARS Coronavirus 2 NEGATIVE NEGATIVE  Ethanol  Result Value Ref Range   Alcohol, Ethyl (B) <10 <10 mg/dL  Protime-INR  Result Value Ref Range   Prothrombin Time 18.5 (H) 11.4 - 15.2 seconds   INR 1.6 (H) 0.8 - 1.2  APTT  Result Value Ref Range   aPTT 35 24 - 36 seconds  CBC  Result Value Ref Range   WBC 6.5 4.0 - 10.5 K/uL   RBC 4.87 3.87 - 5.11 MIL/uL   Hemoglobin 14.2 12.0 - 15.0 g/dL   HCT 42.6 36.0 - 46.0 %   MCV 87.5 80.0 - 100.0 fL   MCH 29.2 26.0 - 34.0 pg   MCHC 33.3 30.0 - 36.0 g/dL   RDW 12.8 11.5 - 15.5 %   Platelets 171 150 - 400 K/uL   nRBC 0.0 0.0 - 0.2 %  Differential  Result Value Ref Range   Neutrophils Relative % 61 %   Neutro Abs 3.9 1.7 - 7.7 K/uL   Lymphocytes Relative 30 %   Lymphs Abs 1.9 0.7 - 4.0 K/uL   Monocytes Relative 8 %   Monocytes Absolute 0.5 0.1 - 1.0 K/uL   Eosinophils Relative 1 %   Eosinophils Absolute 0.1 0.0 - 0.5 K/uL   Basophils Relative 0 %   Basophils Absolute 0.0 0.0 - 0.1 K/uL   Immature Granulocytes 0 %   Abs Immature Granulocytes 0.02 0.00 - 0.07 K/uL  Comprehensive metabolic panel   Result Value Ref Range   Sodium 139 135 - 145 mmol/L   Potassium 4.1 3.5 - 5.1 mmol/L   Chloride 102 98 - 111 mmol/L   CO2 30 22 - 32 mmol/L   Glucose, Bld 174 (H) 70 - 99 mg/dL   BUN 13 8 - 23 mg/dL   Creatinine, Ser 0.90 0.44 - 1.00 mg/dL   Calcium 9.0 8.9 - 10.3 mg/dL   Total Protein 7.2 6.5 - 8.1 g/dL   Albumin 4.0 3.5 - 5.0 g/dL   AST 17 15 - 41 U/L   ALT 15 0 - 44 U/L   Alkaline Phosphatase 89 38 - 126 U/L   Total Bilirubin 1.0 0.3 - 1.2 mg/dL   GFR calc non Af Amer >60 >60 mL/min   GFR calc Af Amer >60 >60 mL/min   Anion gap 7 5 - 15  Urine rapid drug screen (hosp performed)  Result Value Ref Range   Opiates NONE DETECTED NONE DETECTED   Cocaine NONE DETECTED NONE DETECTED   Benzodiazepines POSITIVE (A) NONE DETECTED   Amphetamines NONE DETECTED NONE DETECTED   Tetrahydrocannabinol NONE DETECTED NONE DETECTED   Barbiturates  NONE DETECTED NONE DETECTED  Urinalysis, Routine w reflex microscopic  Result Value Ref Range   Color, Urine YELLOW YELLOW   APPearance CLEAR CLEAR   Specific Gravity, Urine 1.009 1.005 - 1.030   pH 8.0 5.0 - 8.0   Glucose, UA NEGATIVE NEGATIVE mg/dL   Hgb urine dipstick MODERATE (A) NEGATIVE   Bilirubin Urine NEGATIVE NEGATIVE   Ketones, ur NEGATIVE NEGATIVE mg/dL   Protein, ur NEGATIVE NEGATIVE mg/dL   Nitrite NEGATIVE NEGATIVE   Leukocytes,Ua NEGATIVE NEGATIVE   RBC / HPF 0-5 0 - 5 RBC/hpf   WBC, UA 0-5 0 - 5 WBC/hpf   Bacteria, UA NONE SEEN NONE SEEN   Squamous Epithelial / LPF 0-5 0 - 5  CBG monitoring, ED  Result Value Ref Range   Glucose-Capillary 164 (H) 70 - 99 mg/dL  I-stat chem 8, ED  Result Value Ref Range   Sodium 140 135 - 145 mmol/L   Potassium 4.0 3.5 - 5.1 mmol/L   Chloride 100 98 - 111 mmol/L   BUN 14 8 - 23 mg/dL   Creatinine, Ser 1.00 0.44 - 1.00 mg/dL   Glucose, Bld 168 (H) 70 - 99 mg/dL   Calcium, Ion 1.17 1.15 - 1.40 mmol/L   TCO2 29 22 - 32 mmol/L   Hemoglobin 14.6 12.0 - 15.0 g/dL   HCT 43.0 36.0 - 46.0 %    Mr Angio Head Wo Contrast  Result Date: 03/07/2019 CLINICAL DATA:  Initial evaluation for acute speech difficulty, stroke-like symptoms. EXAM: MRI HEAD WITHOUT CONTRAST MRA HEAD WITHOUT CONTRAST MRA NECK WITHOUT CONTRAST TECHNIQUE: Multiplanar, multiecho pulse sequences of the brain and surrounding structures were obtained without intravenous contrast. Angiographic images of the Circle of Willis were obtained using MRA technique without intravenous contrast. Angiographic images of the neck were obtained using MRA technique without intravenous contrast. Carotid stenosis measurements (when applicable) are obtained utilizing NASCET criteria, using the distal internal carotid diameter as the denominator. COMPARISON:  Prior head CT from 03/06/2019. FINDINGS: MRI HEAD FINDINGS Brain: Generalized age-related cerebral atrophy. Patchy and confluent T2/FLAIR hyperintensity within the periventricular and deep white matter both cerebral hemispheres most consistent with chronic small vessel ischemic disease, mild in nature. Several scattered superimposed remote lacunar infarcts seen involving the right basal ganglia and hemispheric cerebral white matter. Chronic superior right cerebellar infarct noted. Patchy small volume restricted diffusion seen involving the cortical and subcortical right frontal operculum, consistent with acute right MCA territory infarct. No associated hemorrhage or mass effect. No other evidence for acute or subacute ischemia. No acute intracranial hemorrhage. No mass lesion, midline shift or mass effect. No hydrocephalus. No extra-axial fluid collection. Pituitary gland suprasellar region normal. Midline structures intact. Vascular: Focus of susceptibility artifact seen at the inferior right sylvian fissure, suspected to reflect thrombus in a right M2 branch, seen on corresponding MRA (series 18, image 28). Major intracranial vascular flow voids are otherwise maintained. Skull and upper cervical  spine: Craniocervical junction within normal limits. Postsurgical changes partially visualize within the upper cervical spine. Bone marrow signal intensity normal. No scalp soft tissue abnormality. Sinuses/Orbits: Patient status post bilateral ocular lens replacement. Paranasal sinuses are clear. No significant mastoid effusion. Inner ear structures normal. Other: None. MRA HEAD FINDINGS ANTERIOR CIRCULATION: Distal cervical segments of the internal carotid arteries are patent with symmetric antegrade flow. Petrous segments patent bilaterally. Scattered atheromatous irregularity within the cavernous/supraclinoid ICAs without high-grade stenosis, slightly worse on the right. A1 segments patent bilaterally. Normal anterior communicating artery. Anterior cerebral arteries widely  patent to their distal aspects without stenosis. Right M1 widely patent. Normal right MCA bifurcation. There is focal occlusion of a proximal right M2 branch at the base of the sylvian fissure, corresponding with susceptibility artifacts seen on corresponding MRI (series 7, image 110). Right MCA branches otherwise perfused. Left M1 patent. Normal left MCA bifurcation. Distal left MCA branches well perfused. POSTERIOR CIRCULATION: Mild atheromatous irregularity within the vertebral arteries bilaterally without flow-limiting stenosis. Left vertebral artery dominant. Posterior inferior cerebral arteries patent bilaterally. Basilar patent to its distal aspect without flow-limiting stenosis. Superior cerebral arteries patent bilaterally. Hypoplastic P1 segments with robust bilateral posterior communicating arteries. Scattered atheromatous irregularity throughout the PCAs without high-grade flow-limiting stenosis. Distal small vessel atheromatous irregularity seen throughout the intracranial circulation. No intracranial aneurysm. MRA NECK FINDINGS AORTIC ARCH: Visualized aortic arch of normal caliber with normal 3 vessel morphology. No  hemodynamically significant stenosis seen about the origin of the great vessels. Visualized subclavian arteries widely patent. RIGHT CAROTID SYSTEM: Right common carotid artery widely patent from its origin to the bifurcation. Mild atheromatous irregularity about the right bifurcation and proximal right ICA without hemodynamically significant stenosis. Right ICA widely patent distally to the skull base without stenosis or occlusion. LEFT CAROTID SYSTEM: Left common carotid artery widely patent from its origin to the bifurcation without stenosis. Mild atheromatous irregularity about the left bifurcation/proximal left ICA without hemodynamically significant stenosis. Left ICA widely patent distally to the skull base without stenosis or occlusion. VERTEBRAL ARTERIES: Both vertebral arteries arise from the subclavian arteries. Vertebral arteries not well assessed proximally. Left vertebral artery dominant. Visualized vertebral arteries widely patent within the neck without stenosis or occlusion. IMPRESSION: MRI HEAD IMPRESSION: 1. Patchy small volume acute ischemic nonhemorrhagic right MCA territory infarct involving the right frontal operculum. 2. Underlying moderate chronic microvascular ischemic disease with scattered remote lacunar infarcts within the right basal ganglia and hemispheric cerebral white matter. 3. Superimposed chronic superior right cerebellar infarct. MRA HEAD IMPRESSION: 1. Acute right M2 occlusion, consistent with the right MCA territory infarct. 2. Moderate atherosclerotic change elsewhere throughout the intracranial circulation. No other hemodynamically significant or correctable stenosis. MRA NECK IMPRESSION: 1. Mild atheromatous irregularity about the carotid bifurcations/proximal ICAs bilaterally without hemodynamically significant or critical flow limiting stenosis. Both carotid artery systems otherwise widely patent within the neck. 2. Wide patency of the vertebral arteries within the neck.  Left vertebral artery dominant Electronically Signed   By: Jeannine Boga M.D.   On: 03/07/2019 05:24   Mr Angio Neck Wo Contrast  Result Date: 03/07/2019 CLINICAL DATA:  Initial evaluation for acute speech difficulty, stroke-like symptoms. EXAM: MRI HEAD WITHOUT CONTRAST MRA HEAD WITHOUT CONTRAST MRA NECK WITHOUT CONTRAST TECHNIQUE: Multiplanar, multiecho pulse sequences of the brain and surrounding structures were obtained without intravenous contrast. Angiographic images of the Circle of Willis were obtained using MRA technique without intravenous contrast. Angiographic images of the neck were obtained using MRA technique without intravenous contrast. Carotid stenosis measurements (when applicable) are obtained utilizing NASCET criteria, using the distal internal carotid diameter as the denominator. COMPARISON:  Prior head CT from 03/06/2019. FINDINGS: MRI HEAD FINDINGS Brain: Generalized age-related cerebral atrophy. Patchy and confluent T2/FLAIR hyperintensity within the periventricular and deep white matter both cerebral hemispheres most consistent with chronic small vessel ischemic disease, mild in nature. Several scattered superimposed remote lacunar infarcts seen involving the right basal ganglia and hemispheric cerebral white matter. Chronic superior right cerebellar infarct noted. Patchy small volume restricted diffusion seen involving the cortical and subcortical right frontal operculum,  consistent with acute right MCA territory infarct. No associated hemorrhage or mass effect. No other evidence for acute or subacute ischemia. No acute intracranial hemorrhage. No mass lesion, midline shift or mass effect. No hydrocephalus. No extra-axial fluid collection. Pituitary gland suprasellar region normal. Midline structures intact. Vascular: Focus of susceptibility artifact seen at the inferior right sylvian fissure, suspected to reflect thrombus in a right M2 branch, seen on corresponding MRA (series  18, image 28). Major intracranial vascular flow voids are otherwise maintained. Skull and upper cervical spine: Craniocervical junction within normal limits. Postsurgical changes partially visualize within the upper cervical spine. Bone marrow signal intensity normal. No scalp soft tissue abnormality. Sinuses/Orbits: Patient status post bilateral ocular lens replacement. Paranasal sinuses are clear. No significant mastoid effusion. Inner ear structures normal. Other: None. MRA HEAD FINDINGS ANTERIOR CIRCULATION: Distal cervical segments of the internal carotid arteries are patent with symmetric antegrade flow. Petrous segments patent bilaterally. Scattered atheromatous irregularity within the cavernous/supraclinoid ICAs without high-grade stenosis, slightly worse on the right. A1 segments patent bilaterally. Normal anterior communicating artery. Anterior cerebral arteries widely patent to their distal aspects without stenosis. Right M1 widely patent. Normal right MCA bifurcation. There is focal occlusion of a proximal right M2 branch at the base of the sylvian fissure, corresponding with susceptibility artifacts seen on corresponding MRI (series 7, image 110). Right MCA branches otherwise perfused. Left M1 patent. Normal left MCA bifurcation. Distal left MCA branches well perfused. POSTERIOR CIRCULATION: Mild atheromatous irregularity within the vertebral arteries bilaterally without flow-limiting stenosis. Left vertebral artery dominant. Posterior inferior cerebral arteries patent bilaterally. Basilar patent to its distal aspect without flow-limiting stenosis. Superior cerebral arteries patent bilaterally. Hypoplastic P1 segments with robust bilateral posterior communicating arteries. Scattered atheromatous irregularity throughout the PCAs without high-grade flow-limiting stenosis. Distal small vessel atheromatous irregularity seen throughout the intracranial circulation. No intracranial aneurysm. MRA NECK FINDINGS  AORTIC ARCH: Visualized aortic arch of normal caliber with normal 3 vessel morphology. No hemodynamically significant stenosis seen about the origin of the great vessels. Visualized subclavian arteries widely patent. RIGHT CAROTID SYSTEM: Right common carotid artery widely patent from its origin to the bifurcation. Mild atheromatous irregularity about the right bifurcation and proximal right ICA without hemodynamically significant stenosis. Right ICA widely patent distally to the skull base without stenosis or occlusion. LEFT CAROTID SYSTEM: Left common carotid artery widely patent from its origin to the bifurcation without stenosis. Mild atheromatous irregularity about the left bifurcation/proximal left ICA without hemodynamically significant stenosis. Left ICA widely patent distally to the skull base without stenosis or occlusion. VERTEBRAL ARTERIES: Both vertebral arteries arise from the subclavian arteries. Vertebral arteries not well assessed proximally. Left vertebral artery dominant. Visualized vertebral arteries widely patent within the neck without stenosis or occlusion. IMPRESSION: MRI HEAD IMPRESSION: 1. Patchy small volume acute ischemic nonhemorrhagic right MCA territory infarct involving the right frontal operculum. 2. Underlying moderate chronic microvascular ischemic disease with scattered remote lacunar infarcts within the right basal ganglia and hemispheric cerebral white matter. 3. Superimposed chronic superior right cerebellar infarct. MRA HEAD IMPRESSION: 1. Acute right M2 occlusion, consistent with the right MCA territory infarct. 2. Moderate atherosclerotic change elsewhere throughout the intracranial circulation. No other hemodynamically significant or correctable stenosis. MRA NECK IMPRESSION: 1. Mild atheromatous irregularity about the carotid bifurcations/proximal ICAs bilaterally without hemodynamically significant or critical flow limiting stenosis. Both carotid artery systems otherwise  widely patent within the neck. 2. Wide patency of the vertebral arteries within the neck. Left vertebral artery dominant Electronically Signed   By:  Jeannine Boga M.D.   On: 03/07/2019 05:24   Mr Brain Wo Contrast  Result Date: 03/07/2019 CLINICAL DATA:  Initial evaluation for acute speech difficulty, stroke-like symptoms. EXAM: MRI HEAD WITHOUT CONTRAST MRA HEAD WITHOUT CONTRAST MRA NECK WITHOUT CONTRAST TECHNIQUE: Multiplanar, multiecho pulse sequences of the brain and surrounding structures were obtained without intravenous contrast. Angiographic images of the Circle of Willis were obtained using MRA technique without intravenous contrast. Angiographic images of the neck were obtained using MRA technique without intravenous contrast. Carotid stenosis measurements (when applicable) are obtained utilizing NASCET criteria, using the distal internal carotid diameter as the denominator. COMPARISON:  Prior head CT from 03/06/2019. FINDINGS: MRI HEAD FINDINGS Brain: Generalized age-related cerebral atrophy. Patchy and confluent T2/FLAIR hyperintensity within the periventricular and deep white matter both cerebral hemispheres most consistent with chronic small vessel ischemic disease, mild in nature. Several scattered superimposed remote lacunar infarcts seen involving the right basal ganglia and hemispheric cerebral white matter. Chronic superior right cerebellar infarct noted. Patchy small volume restricted diffusion seen involving the cortical and subcortical right frontal operculum, consistent with acute right MCA territory infarct. No associated hemorrhage or mass effect. No other evidence for acute or subacute ischemia. No acute intracranial hemorrhage. No mass lesion, midline shift or mass effect. No hydrocephalus. No extra-axial fluid collection. Pituitary gland suprasellar region normal. Midline structures intact. Vascular: Focus of susceptibility artifact seen at the inferior right sylvian fissure,  suspected to reflect thrombus in a right M2 branch, seen on corresponding MRA (series 18, image 28). Major intracranial vascular flow voids are otherwise maintained. Skull and upper cervical spine: Craniocervical junction within normal limits. Postsurgical changes partially visualize within the upper cervical spine. Bone marrow signal intensity normal. No scalp soft tissue abnormality. Sinuses/Orbits: Patient status post bilateral ocular lens replacement. Paranasal sinuses are clear. No significant mastoid effusion. Inner ear structures normal. Other: None. MRA HEAD FINDINGS ANTERIOR CIRCULATION: Distal cervical segments of the internal carotid arteries are patent with symmetric antegrade flow. Petrous segments patent bilaterally. Scattered atheromatous irregularity within the cavernous/supraclinoid ICAs without high-grade stenosis, slightly worse on the right. A1 segments patent bilaterally. Normal anterior communicating artery. Anterior cerebral arteries widely patent to their distal aspects without stenosis. Right M1 widely patent. Normal right MCA bifurcation. There is focal occlusion of a proximal right M2 branch at the base of the sylvian fissure, corresponding with susceptibility artifacts seen on corresponding MRI (series 7, image 110). Right MCA branches otherwise perfused. Left M1 patent. Normal left MCA bifurcation. Distal left MCA branches well perfused. POSTERIOR CIRCULATION: Mild atheromatous irregularity within the vertebral arteries bilaterally without flow-limiting stenosis. Left vertebral artery dominant. Posterior inferior cerebral arteries patent bilaterally. Basilar patent to its distal aspect without flow-limiting stenosis. Superior cerebral arteries patent bilaterally. Hypoplastic P1 segments with robust bilateral posterior communicating arteries. Scattered atheromatous irregularity throughout the PCAs without high-grade flow-limiting stenosis. Distal small vessel atheromatous irregularity  seen throughout the intracranial circulation. No intracranial aneurysm. MRA NECK FINDINGS AORTIC ARCH: Visualized aortic arch of normal caliber with normal 3 vessel morphology. No hemodynamically significant stenosis seen about the origin of the great vessels. Visualized subclavian arteries widely patent. RIGHT CAROTID SYSTEM: Right common carotid artery widely patent from its origin to the bifurcation. Mild atheromatous irregularity about the right bifurcation and proximal right ICA without hemodynamically significant stenosis. Right ICA widely patent distally to the skull base without stenosis or occlusion. LEFT CAROTID SYSTEM: Left common carotid artery widely patent from its origin to the bifurcation without stenosis. Mild atheromatous irregularity about  the left bifurcation/proximal left ICA without hemodynamically significant stenosis. Left ICA widely patent distally to the skull base without stenosis or occlusion. VERTEBRAL ARTERIES: Both vertebral arteries arise from the subclavian arteries. Vertebral arteries not well assessed proximally. Left vertebral artery dominant. Visualized vertebral arteries widely patent within the neck without stenosis or occlusion. IMPRESSION: MRI HEAD IMPRESSION: 1. Patchy small volume acute ischemic nonhemorrhagic right MCA territory infarct involving the right frontal operculum. 2. Underlying moderate chronic microvascular ischemic disease with scattered remote lacunar infarcts within the right basal ganglia and hemispheric cerebral white matter. 3. Superimposed chronic superior right cerebellar infarct. MRA HEAD IMPRESSION: 1. Acute right M2 occlusion, consistent with the right MCA territory infarct. 2. Moderate atherosclerotic change elsewhere throughout the intracranial circulation. No other hemodynamically significant or correctable stenosis. MRA NECK IMPRESSION: 1. Mild atheromatous irregularity about the carotid bifurcations/proximal ICAs bilaterally without  hemodynamically significant or critical flow limiting stenosis. Both carotid artery systems otherwise widely patent within the neck. 2. Wide patency of the vertebral arteries within the neck. Left vertebral artery dominant Electronically Signed   By: Jeannine Boga M.D.   On: 03/07/2019 05:24   Ct Head Code Stroke Wo Contrast  Result Date: 03/06/2019 CLINICAL DATA:  Code stroke. Slurred speech. Left-sided facial droop. Symptoms began 1900 hours. Headache. EXAM: CT HEAD WITHOUT CONTRAST TECHNIQUE: Contiguous axial images were obtained from the base of the skull through the vertex without intravenous contrast. COMPARISON:  MRI 12/20/2017.  CT 12/19/2017. FINDINGS: Brain: Old infarctions affect the right cerebellum. Cerebral hemispheres show early cortical low-density in the right frontal operculum. There is old infarction in the radiating white matter tracts on the right. No mass lesion, hemorrhage, hydrocephalus or extra-axial collection. Vascular: There is atherosclerotic calcification of the major vessels at the base of the brain. Skull: Normal Sinuses/Orbits: Clear/normal Other: None ASPECTS ([employer] Stroke Program Early CT Score) - Ganglionic level infarction (caudate, lentiform nuclei, internal capsule, insula, M1-M3 cortex): 6 - Supraganglionic infarction (M4-M6 cortex): 3 Total score (0-10 with 10 being normal): 9 IMPRESSION: 1. Question loss of gray-white differentiation in the right frontal operculum, M1 sector. No evidence of hemorrhage or mass effect. Old infarctions in the right cerebellum and right radiating white matter tracts. 2. ASPECTS is 9 3. These results were called by telephone at the time of interpretation on 03/06/2019 at 9:44 pm to Dr. Aletta Edouard , who verbally acknowledged these results. Electronically Signed   By: Nelson Chimes M.D.   On: [id number]      Delora Fuel, MD [id number] [phone]

## 2019-03-07 NOTE — H&P (Signed)
History and Physical    [name] [id number] DOB: 1940-12-25 DOA: 03/06/2019  Referring MD/NP/PA: Ivor Costa, MD PCP: Dorothyann Peng, NP  Patient coming from: Transferred from [place] Complaint: Difficulty speaking  I have personally briefly reviewed patient's old medical records in [place]   HPI: [name] is a 78 y.o. female with medical history significant of paroxysmal atrial fibrillation on Coumadin, HTN, diastolic CHF, CAD, esophageal cancer, and anemia; who presents with complaints of difficulty speaking at around 7 PM last night while eating dinner.  History is difficult to obtain due to patient's dysphasia.  She was witnessed having food acutely started falling out of the left side of her mouth and being unable to speak during dinner.  Earlier in the day patient reported not feeling well and complaints of a headache.  She reports taking her Coumadin as advised.  Denies having any fever, headache, cough, shortness of breath, nausea, vomiting, dysuria, or diarrhea symptoms.  ED Course: Upon admission into the [facility] emergency department as a code stroke patient was noted to be afebrile, blood pressure 150/80 8-2 113/92, and all other vital signs maintained.  Labs significant for INR 1.6 and glucose of 168.  Patient was not a candidate for TPA due to being on Coumadin.  Transferred to [employer] [facility].  MRI of the brain revealed right MCA territory infarct involving the right frontal operculum and right M2 occlusion.    Review of systems: A complete 10 point review of systems was performed and negative except for as noted above in HPI.  Past Medical History:  Diagnosis Date   Anemia 2005   Generally microcytic, transfusions in [address], 2012, 02/2015, 05/2015   Aortic stenosis    Moderate November 2017   Arthritis    Broken back 2013   Chronic back pain.    CAD (coronary artery disease)    Cardiac catheterization 2014 - 80% mid RCA and 70% OM managed  medically   CHF (congestive heart failure) ([name])    Chronic bilateral pleural effusions    Chronic headache    Contrast media allergy    Diastolic heart failure ([name])    Esophageal cancer ([name]) 05/06/2015   Adenocarcinoma GE junction   Facial paresthesia    GERD (gastroesophageal reflux disease)    H. pylori infection    H/O iron deficiency    05-06-15 iron infusion ([facility])   HH (hiatus hernia) 2008   Large with associated erosions   Hypertension    Paroxysmal atrial fibrillation ([name])    PONV (postoperative nausea and vomiting)    Pulmonary emboli ([place]) 2008, 2012   Stroke [facility]) 1982    Past Surgical History:  Procedure Laterality Date   APPENDECTOMY  1950s   BACK SURGERY     CARPAL TUNNEL RELEASE Bilateral 1990s   CERVICAL [place] SURGERY  2014   CHOLECYSTECTOMY  1980s   open   COLONOSCOPY N/A 02/17/2015   Procedure: COLONOSCOPY;  Surgeon: [name], MD;  Location: [facility] ENDOSCOPY;  Service: Endoscopy;  Laterality: N/A;   ESOPHAGOGASTRODUODENOSCOPY N/A 02/16/2015   Procedure: ESOPHAGOGASTRODUODENOSCOPY (EGD);  Surgeon: [name], MD;  Location: [facility] ENDOSCOPY;  Service: Endoscopy;  Laterality: N/A;   ESOPHAGOGASTRODUODENOSCOPY N/A 05/06/2015   Procedure: ESOPHAGOGASTRODUODENOSCOPY (EGD);  Surgeon: [name], MD;  Location: [facility] ENDOSCOPY;  Service: Endoscopy;  Laterality: N/A;   EUS N/A 05/20/2015   Procedure: UPPER ENDOSCOPIC ULTRASOUND (EUS) LINEAR;  Surgeon: [name], MD;  Location: [facility] ENDOSCOPY;  Service: Endoscopy;  Laterality: N/A;   exploratory lab  1950s or [place] N/A 05/06/2015   Procedure: GIVENS CAPSULE STUDY;  Surgeon: [name], MD;  Location: [facility] ENDOSCOPY;  Service: Endoscopy;  Laterality: N/A;   KNEE ARTHROSCOPY WITH MEDIAL MENISECTOMY Left 04/18/2018   Procedure: LEFT KNEE ARTHROSCOPY WITH PARTIAL MEDIAL MENISECTOMY AND ANTERIOR CRUCIATE LIGAMENT DEBRIDEMENT;  Surgeon: [name], MD;   Location: [facility] ORS;  Service: Orthopedics;  Laterality: Left;   LEFT HEART CATH AND CORONARY ANGIOGRAPHY N/A 11/08/2016   Procedure: Left Heart Cath and Coronary Angiography;  Surgeon: [name], MD;  Location: [facility] CV LAB;  Service: Cardiovascular;  Laterality: N/A;   lumbar back surgery  2012   [place] AND ADENOIDECTOMY  1960s     reports that she has never smoked. She has never used smokeless tobacco. She reports that she does not drink alcohol or use drugs.  Allergies  Allergen Reactions   Iodinated Diagnostic Agents Anaphylaxis and Other (See Comments)    IPD dye Info given by patient   Ioxaglate Anaphylaxis and Other (See Comments)    Info given by patient   Red Dye Anaphylaxis   Lactose Intolerance (Gi)    Whey Other (See Comments)    Lactose intolerance   Darvon [Propoxyphene] Rash   Hydralazine Anxiety and Other (See Comments)    Facial flushing, pt prefers not to use it.    Milk-Related Compounds Other (See Comments)    Lactose intolerance Can tolerate milk if its cooked into the recipe, just can't drink milk     Family History  Problem Relation Age of Onset   Stroke Mother    Heart disease Mother    Emphysema Father    Ovarian cancer Sister    Stroke Sister    Other Child        died at birth    Prior to Admission medications   Medication Sig Start Date End Date Taking? Authorizing Provider  acetaminophen (TYLENOL) 500 MG tablet Take 500-1,000 mg by mouth daily as needed for mild pain or moderate pain.     [provider]  amLODipine (NORVASC) 5 MG tablet Take 1 tablet (5 mg total) by mouth daily. 12/25/18   Pixie Casino, MD  aspirin EC 81 MG tablet Take 1 tablet (81 mg total) by mouth daily. 11/02/16   Hilty, Miranda Corwin, MD  carvedilol (COREG) 25 MG tablet TAKE (1) TABLET BY MOUTH TWICE DAILY WITH A MEAL. 04/03/18   Nafziger, Tommi Rumps, NP  ezetimibe-simvastatin (VYTORIN)  10-20 MG tablet TAKE (1) TABLET BY MOUTH DAILY. 02/17/19   Hilty, Miranda Corwin, MD  furosemide (LASIX) 40 MG tablet TAKE 1 TABLET BY MOUTH ONCE DAILY. 02/19/19   Nafziger, Tommi Rumps, NP  meclizine (ANTIVERT) 25 MG tablet Take 1 tablet (25 mg total) by mouth as needed for dizziness or nausea. 08/02/18   Nafziger, Tommi Rumps, NP  ondansetron (ZOFRAN) 4 MG tablet Take 1 tablet (4 mg total) by mouth every 8 (eight) hours as needed for nausea or vomiting. 02/19/18   Nafziger, Tommi Rumps, NP  oxyCODONE (OXY IR/ROXICODONE) 5 MG immediate release tablet Take 1 tablet (5 mg total) by mouth every 6 (six) hours as needed for breakthrough pain. 04/22/18   Oscar La, Arlo C, PA-C  pantoprazole (PROTONIX) 40 MG tablet TAKE 1 TABLET BY MOUTH TWICE DAILY. 03/04/19   Nafziger, Tommi Rumps, NP  PARoxetine (PAXIL) 30 MG  tablet Take 1 tablet (30 mg total) by mouth daily. 08/02/18 10/31/18  Dorothyann Peng, NP  Pediatric Multivitamins-Iron (FLINTSTONES PLUS IRON) chewable tablet Chew 2 tablets by mouth daily. 05/30/16   Samuella Cota, MD  potassium chloride (K-DUR) 10 MEQ tablet Take 1 tablet (10 mEq total) by mouth daily. 06/07/18   Nafziger, Tommi Rumps, NP  temazepam (RESTORIL) 15 MG capsule TAKE (1) CAPSULE BY MOUTH AT BEDTIME. 02/19/19   Nafziger, Tommi Rumps, NP  traMADol (ULTRAM) 50 MG tablet TAKE (1) TABLET BY MOUTH EVERY EIGHT HOURS. 02/19/19   Nafziger, Tommi Rumps, NP  triamcinolone cream (KENALOG) 0.1 % Apply 1 application topically 2 (two) times daily. 11/18/18   Burchette, Alinda Sierras, MD  warfarin (COUMADIN) 5 MG tablet Take 1/2 tablet daily except take 1 tablet on Sun and Thurs or TAKE AS DIRECTED BY ANTICOAGULATION CLINIC 03/05/19   Dorothyann Peng, NP    Physical Exam:  Constitutional: Older female who appears to be in no acute distress at this time Vitals:   03/07/19 0019 03/07/19 0030 03/07/19 0131 03/07/19 0744  BP: (!) 171/74 (!) 187/78 (!) 213/92 (!) 165/72  Pulse: (!) 40 73 70 82  Resp: 19 20 18 16   Temp:   97.6 F (36.4 C)   TempSrc:   Oral   SpO2:  97% 96% 98% 97%  Weight:      Height:       Eyes: PERRL, lids and conjunctivae normal ENMT: Mucous membranes are dry.  Patient drooling out of the left side of her mouth. Neck: normal, supple, no masses, no thyromegaly Respiratory: clear to auscultation bilaterally, no wheezing, no crackles. Normal respiratory effort. No accessory muscle use.  Cardiovascular: Irregular irregular with positive murmur.   No extremity edema. 2+ pedal pulses. No carotid bruits.  Abdomen: no tenderness, no masses palpated. No hepatosplenomegaly. Bowel sounds positive.  Musculoskeletal: no clubbing / cyanosis. No joint deformity upper and lower extremities. Good ROM, no contractures. Normal muscle tone.  Skin: no rashes, lesions, ulcers. No induration Neurologic: CN 2-12 grossly intact. Sensation intact, DTR normal.  Strength slightly decreased on the left upper extremity.[name]  Dysphasia present. Psychiatric: Normal judgment and insight. Alert and oriented x 3. Normal mood.     Labs on Admission: I have personally reviewed following labs and imaging studies  CBC: Recent Labs  Lab 03/06/19 2133 03/06/19 2137  WBC 6.5  --   NEUTROABS 3.9  --   HGB 14.2 14.6  HCT 42.6 43.0  MCV 87.5  --   PLT 171  --    Basic Metabolic Panel: Recent Labs  Lab 03/06/19 2132 03/06/19 2137  NA 139 140  K 4.1 4.0  CL 102 100  CO2 30  --   GLUCOSE 174* 168*  BUN 13 14  CREATININE 0.90 1.00  CALCIUM 9.0  --    GFR: Estimated Creatinine Clearance: 36.1 mL/min (by C-G formula based on SCr of 1 mg/dL). Liver Function Tests: Recent Labs  Lab 03/06/19 2132  AST 17  ALT 15  ALKPHOS 89  BILITOT 1.0  PROT 7.2  ALBUMIN 4.0   No results for input(s): LIPASE, AMYLASE in the last 168 hours. No results for input(s): AMMONIA in the last 168 hours. Coagulation Profile: Recent Labs  Lab 03/06/19 2132  INR 1.6*   Cardiac Enzymes: No results for input(s): CKTOTAL, CKMB, CKMBINDEX, TROPONINI in the last 168 hours. BNP  (last 3 results) Recent Labs    03/07/18 0939  PROBNP 268.0*   HbA1C: No results for input(s):  HGBA1C in the last 72 hours. CBG: Recent Labs  Lab 03/06/19 2131  GLUCAP 164*   Lipid Profile: No results for input(s): CHOL, HDL, LDLCALC, TRIG, CHOLHDL, LDLDIRECT in the last 72 hours. Thyroid Function Tests: No results for input(s): TSH, T4TOTAL, FREET4, T3FREE, THYROIDAB in the last 72 hours. Anemia Panel: No results for input(s): VITAMINB12, FOLATE, FERRITIN, TIBC, IRON, RETICCTPCT in the last 72 hours. Urine analysis:    Component Value Date/Time   COLORURINE YELLOW 03/06/2019 2326   APPEARANCEUR CLEAR 03/06/2019 2326   LABSPEC 1.009 03/06/2019 2326   PHURINE 8.0 03/06/2019 2326   GLUCOSEU NEGATIVE 03/06/2019 2326   HGBUR MODERATE (A) 03/06/2019 2326   BILIRUBINUR NEGATIVE 03/06/2019 2326   BILIRUBINUR n 01/29/2018 1029   KETONESUR NEGATIVE 03/06/2019 2326   PROTEINUR NEGATIVE 03/06/2019 2326   UROBILINOGEN 0.2 01/29/2018 1029   UROBILINOGEN 0.2 05/04/2015 2308   NITRITE NEGATIVE 03/06/2019 2326   LEUKOCYTESUR NEGATIVE 03/06/2019 2326   Sepsis Labs: Recent Results (from the past 240 hour(s))  SARS Coronavirus 2 [facility] order, Performed in [facility] hospital lab) Nasopharyngeal Nasopharyngeal Swab     Status: None   Collection Time: 03/06/19  9:40 PM   Specimen: Nasopharyngeal Swab  Result Value Ref Range Status   SARS Coronavirus 2 NEGATIVE NEGATIVE Final    Comment: (NOTE) If result is NEGATIVE SARS-CoV-2 target nucleic acids are NOT DETECTED. The SARS-CoV-2 RNA is generally detectable in upper and lower  respiratory specimens during the acute phase of infection. The lowest  concentration of SARS-CoV-2 viral copies this assay can detect is 250  copies / mL. A negative result does not preclude SARS-CoV-2 infection  and should not be used as the sole basis for treatment or other  patient management decisions.  A negative result may occur with  improper specimen  collection / handling, submission of specimen other  than nasopharyngeal swab, presence of viral mutation(s) within the  areas targeted by this assay, and inadequate number of viral copies  (<250 copies / mL). A negative result must be combined with clinical  observations, patient history, and epidemiological information. If result is POSITIVE SARS-CoV-2 target nucleic acids are DETECTED. The SARS-CoV-2 RNA is generally detectable in upper and lower  respiratory specimens dur ing the acute phase of infection.  Positive  results are indicative of active infection with SARS-CoV-2.  Clinical  correlation with patient history and other diagnostic information is  necessary to determine patient infection status.  Positive results do  not rule out bacterial infection or co-infection with other viruses. If result is PRESUMPTIVE POSTIVE SARS-CoV-2 nucleic acids MAY BE PRESENT.   A presumptive positive result was obtained on the submitted specimen  and confirmed on repeat testing.  While 2019 novel coronavirus  (SARS-CoV-2) nucleic acids may be present in the submitted sample  additional confirmatory testing may be necessary for epidemiological  and / or clinical management purposes  to differentiate between  SARS-CoV-2 and other Sarbecovirus currently known to infect humans.  If clinically indicated additional testing with an alternate test  methodology [phone]) is advised. The SARS-CoV-2 RNA is generally  detectable in upper and lower respiratory sp ecimens during the acute  phase of infection. The expected result is Negative. Fact Sheet for Patients:  StrictlyIdeas.no Fact Sheet for Healthcare Providers: BankingDealers.co.za This test is not yet approved or cleared by the [place] FDA and has been authorized for detection and/or diagnosis of SARS-CoV-2 by FDA under an Emergency Use Authorization (EUA).  This EUA will remain in  effect  (meaning this test can be used) for the duration of the COVID-19 declaration under Section 564(b)(1) of the Act, 21 U.S.C. section 360bbb-3(b)(1), unless the authorization is terminated or revoked sooner. Performed at [facility], [address] [place], [place] [address]      Radiological Exams on Admission: Mr Angio Head Wo Contrast  Result Date: 03/07/2019 CLINICAL DATA:  Initial evaluation for acute speech difficulty, stroke-like symptoms. EXAM: MRI HEAD WITHOUT CONTRAST MRA HEAD WITHOUT CONTRAST MRA NECK WITHOUT CONTRAST TECHNIQUE: Multiplanar, multiecho pulse sequences of the brain and surrounding structures were obtained without intravenous contrast. Angiographic images of the Circle of Willis were obtained using MRA technique without intravenous contrast. Angiographic images of the neck were obtained using MRA technique without intravenous contrast. Carotid stenosis measurements (when applicable) are obtained utilizing NASCET criteria, using the distal internal carotid diameter as the denominator. COMPARISON:  Prior head CT from 03/06/2019. FINDINGS: MRI HEAD FINDINGS Brain: Generalized age-related cerebral atrophy. Patchy and confluent T2/FLAIR hyperintensity within the periventricular and deep white matter both cerebral hemispheres most consistent with chronic small vessel ischemic disease, mild in nature. Several scattered superimposed remote lacunar infarcts seen involving the right basal ganglia and hemispheric cerebral white matter. Chronic superior right cerebellar infarct noted. Patchy small volume restricted diffusion seen involving the cortical and subcortical right frontal operculum, consistent with acute right MCA territory infarct. No associated hemorrhage or mass effect. No other evidence for acute or subacute ischemia. No acute intracranial hemorrhage. No mass lesion, midline shift or mass effect. No hydrocephalus. No extra-axial fluid collection. Pituitary gland suprasellar region  normal. Midline structures intact. Vascular: Focus of susceptibility artifact seen at the inferior right sylvian fissure, suspected to reflect thrombus in a right M2 branch, seen on corresponding MRA (series 18, image 28). Major intracranial vascular flow voids are otherwise maintained. Skull and upper cervical spine: Craniocervical junction within normal limits. Postsurgical changes partially visualize within the upper cervical spine. Bone marrow signal intensity normal. No scalp soft tissue abnormality. Sinuses/Orbits: Patient status post bilateral ocular lens replacement. Paranasal sinuses are clear. No significant mastoid effusion. Inner ear structures normal. Other: None. MRA HEAD FINDINGS ANTERIOR CIRCULATION: Distal cervical segments of the internal carotid arteries are patent with symmetric antegrade flow. Petrous segments patent bilaterally. Scattered atheromatous irregularity within the cavernous/supraclinoid ICAs without high-grade stenosis, slightly worse on the right. A1 segments patent bilaterally. Normal anterior communicating artery. Anterior cerebral arteries widely patent to their distal aspects without stenosis. Right M1 widely patent. Normal right MCA bifurcation. There is focal occlusion of a proximal right M2 branch at the base of the sylvian fissure, corresponding with susceptibility artifacts seen on corresponding MRI (series 7, image 110). Right MCA branches otherwise perfused. Left M1 patent. Normal left MCA bifurcation. Distal left MCA branches well perfused. POSTERIOR CIRCULATION: Mild atheromatous irregularity within the vertebral arteries bilaterally without flow-limiting stenosis. Left vertebral artery dominant. Posterior inferior cerebral arteries patent bilaterally. Basilar patent to its distal aspect without flow-limiting stenosis. Superior cerebral arteries patent bilaterally. Hypoplastic P1 segments with robust bilateral posterior communicating arteries. Scattered atheromatous  irregularity throughout the PCAs without high-grade flow-limiting stenosis. Distal small vessel atheromatous irregularity seen throughout the intracranial circulation. No intracranial aneurysm. MRA NECK FINDINGS AORTIC ARCH: Visualized aortic arch of normal caliber with normal 3 vessel morphology. No hemodynamically significant stenosis seen about the origin of the great vessels. Visualized subclavian arteries widely patent. RIGHT CAROTID SYSTEM: Right common carotid artery widely patent from its origin to the bifurcation. Mild atheromatous irregularity about the right  bifurcation and proximal right ICA without hemodynamically significant stenosis. Right ICA widely patent distally to the skull base without stenosis or occlusion. LEFT CAROTID SYSTEM: Left common carotid artery widely patent from its origin to the bifurcation without stenosis. Mild atheromatous irregularity about the left bifurcation/proximal left ICA without hemodynamically significant stenosis. Left ICA widely patent distally to the skull base without stenosis or occlusion. VERTEBRAL ARTERIES: Both vertebral arteries arise from the subclavian arteries. Vertebral arteries not well assessed proximally. Left vertebral artery dominant. Visualized vertebral arteries widely patent within the neck without stenosis or occlusion. IMPRESSION: MRI HEAD IMPRESSION: 1. Patchy small volume acute ischemic nonhemorrhagic right MCA territory infarct involving the right frontal operculum. 2. Underlying moderate chronic microvascular ischemic disease with scattered remote lacunar infarcts within the right basal ganglia and hemispheric cerebral white matter. 3. Superimposed chronic superior right cerebellar infarct. MRA HEAD IMPRESSION: 1. Acute right M2 occlusion, consistent with the right MCA territory infarct. 2. Moderate atherosclerotic change elsewhere throughout the intracranial circulation. No other hemodynamically significant or correctable stenosis. MRA NECK  IMPRESSION: 1. Mild atheromatous irregularity about the carotid bifurcations/proximal ICAs bilaterally without hemodynamically significant or critical flow limiting stenosis. Both carotid artery systems otherwise widely patent within the neck. 2. Wide patency of the vertebral arteries within the neck. Left vertebral artery dominant Electronically Signed   By: Jeannine Boga M.D.   On: 03/07/2019 05:24   Mr Angio Neck Wo Contrast  Result Date: 03/07/2019 CLINICAL DATA:  Initial evaluation for acute speech difficulty, stroke-like symptoms. EXAM: MRI HEAD WITHOUT CONTRAST MRA HEAD WITHOUT CONTRAST MRA NECK WITHOUT CONTRAST TECHNIQUE: Multiplanar, multiecho pulse sequences of the brain and surrounding structures were obtained without intravenous contrast. Angiographic images of the Circle of Willis were obtained using MRA technique without intravenous contrast. Angiographic images of the neck were obtained using MRA technique without intravenous contrast. Carotid stenosis measurements (when applicable) are obtained utilizing NASCET criteria, using the distal internal carotid diameter as the denominator. COMPARISON:  Prior head CT from 03/06/2019. FINDINGS: MRI HEAD FINDINGS Brain: Generalized age-related cerebral atrophy. Patchy and confluent T2/FLAIR hyperintensity within the periventricular and deep white matter both cerebral hemispheres most consistent with chronic small vessel ischemic disease, mild in nature. Several scattered superimposed remote lacunar infarcts seen involving the right basal ganglia and hemispheric cerebral white matter. Chronic superior right cerebellar infarct noted. Patchy small volume restricted diffusion seen involving the cortical and subcortical right frontal operculum, consistent with acute right MCA territory infarct. No associated hemorrhage or mass effect. No other evidence for acute or subacute ischemia. No acute intracranial hemorrhage. No mass lesion, midline shift or mass  effect. No hydrocephalus. No extra-axial fluid collection. Pituitary gland suprasellar region normal. Midline structures intact. Vascular: Focus of susceptibility artifact seen at the inferior right sylvian fissure, suspected to reflect thrombus in a right M2 branch, seen on corresponding MRA (series 18, image 28). Major intracranial vascular flow voids are otherwise maintained. Skull and upper cervical spine: Craniocervical junction within normal limits. Postsurgical changes partially visualize within the upper cervical spine. Bone marrow signal intensity normal. No scalp soft tissue abnormality. Sinuses/Orbits: Patient status post bilateral ocular lens replacement. Paranasal sinuses are clear. No significant mastoid effusion. Inner ear structures normal. Other: None. MRA HEAD FINDINGS ANTERIOR CIRCULATION: Distal cervical segments of the internal carotid arteries are patent with symmetric antegrade flow. Petrous segments patent bilaterally. Scattered atheromatous irregularity within the cavernous/supraclinoid ICAs without high-grade stenosis, slightly worse on the right. A1 segments patent bilaterally. Normal anterior communicating artery. Anterior cerebral arteries widely  patent to their distal aspects without stenosis. Right M1 widely patent. Normal right MCA bifurcation. There is focal occlusion of a proximal right M2 branch at the base of the sylvian fissure, corresponding with susceptibility artifacts seen on corresponding MRI (series 7, image 110). Right MCA branches otherwise perfused. Left M1 patent. Normal left MCA bifurcation. Distal left MCA branches well perfused. POSTERIOR CIRCULATION: Mild atheromatous irregularity within the vertebral arteries bilaterally without flow-limiting stenosis. Left vertebral artery dominant. Posterior inferior cerebral arteries patent bilaterally. Basilar patent to its distal aspect without flow-limiting stenosis. Superior cerebral arteries patent bilaterally. Hypoplastic  P1 segments with robust bilateral posterior communicating arteries. Scattered atheromatous irregularity throughout the PCAs without high-grade flow-limiting stenosis. Distal small vessel atheromatous irregularity seen throughout the intracranial circulation. No intracranial aneurysm. MRA NECK FINDINGS AORTIC ARCH: Visualized aortic arch of normal caliber with normal 3 vessel morphology. No hemodynamically significant stenosis seen about the origin of the great vessels. Visualized subclavian arteries widely patent. RIGHT CAROTID SYSTEM: Right common carotid artery widely patent from its origin to the bifurcation. Mild atheromatous irregularity about the right bifurcation and proximal right ICA without hemodynamically significant stenosis. Right ICA widely patent distally to the skull base without stenosis or occlusion. LEFT CAROTID SYSTEM: Left common carotid artery widely patent from its origin to the bifurcation without stenosis. Mild atheromatous irregularity about the left bifurcation/proximal left ICA without hemodynamically significant stenosis. Left ICA widely patent distally to the skull base without stenosis or occlusion. VERTEBRAL ARTERIES: Both vertebral arteries arise from the subclavian arteries. Vertebral arteries not well assessed proximally. Left vertebral artery dominant. Visualized vertebral arteries widely patent within the neck without stenosis or occlusion. IMPRESSION: MRI HEAD IMPRESSION: 1. Patchy small volume acute ischemic nonhemorrhagic right MCA territory infarct involving the right frontal operculum. 2. Underlying moderate chronic microvascular ischemic disease with scattered remote lacunar infarcts within the right basal ganglia and hemispheric cerebral white matter. 3. Superimposed chronic superior right cerebellar infarct. MRA HEAD IMPRESSION: 1. Acute right M2 occlusion, consistent with the right MCA territory infarct. 2. Moderate atherosclerotic change elsewhere throughout the  intracranial circulation. No other hemodynamically significant or correctable stenosis. MRA NECK IMPRESSION: 1. Mild atheromatous irregularity about the carotid bifurcations/proximal ICAs bilaterally without hemodynamically significant or critical flow limiting stenosis. Both carotid artery systems otherwise widely patent within the neck. 2. Wide patency of the vertebral arteries within the neck. Left vertebral artery dominant Electronically Signed   By: Jeannine Boga M.D.   On: 03/07/2019 05:24   Mr Brain Wo Contrast  Result Date: 03/07/2019 CLINICAL DATA:  Initial evaluation for acute speech difficulty, stroke-like symptoms. EXAM: MRI HEAD WITHOUT CONTRAST MRA HEAD WITHOUT CONTRAST MRA NECK WITHOUT CONTRAST TECHNIQUE: Multiplanar, multiecho pulse sequences of the brain and surrounding structures were obtained without intravenous contrast. Angiographic images of the Circle of Willis were obtained using MRA technique without intravenous contrast. Angiographic images of the neck were obtained using MRA technique without intravenous contrast. Carotid stenosis measurements (when applicable) are obtained utilizing NASCET criteria, using the distal internal carotid diameter as the denominator. COMPARISON:  Prior head CT from 03/06/2019. FINDINGS: MRI HEAD FINDINGS Brain: Generalized age-related cerebral atrophy. Patchy and confluent T2/FLAIR hyperintensity within the periventricular and deep white matter both cerebral hemispheres most consistent with chronic small vessel ischemic disease, mild in nature. Several scattered superimposed remote lacunar infarcts seen involving the right basal ganglia and hemispheric cerebral white matter. Chronic superior right cerebellar infarct noted. Patchy small volume restricted diffusion seen involving the cortical and subcortical right frontal operculum, consistent  with acute right MCA territory infarct. No associated hemorrhage or mass effect. No other evidence for acute or  subacute ischemia. No acute intracranial hemorrhage. No mass lesion, midline shift or mass effect. No hydrocephalus. No extra-axial fluid collection. Pituitary gland suprasellar region normal. Midline structures intact. Vascular: Focus of susceptibility artifact seen at the inferior right sylvian fissure, suspected to reflect thrombus in a right M2 branch, seen on corresponding MRA (series 18, image 28). Major intracranial vascular flow voids are otherwise maintained. Skull and upper cervical spine: Craniocervical junction within normal limits. Postsurgical changes partially visualize within the upper cervical spine. Bone marrow signal intensity normal. No scalp soft tissue abnormality. Sinuses/Orbits: Patient status post bilateral ocular lens replacement. Paranasal sinuses are clear. No significant mastoid effusion. Inner ear structures normal. Other: None. MRA HEAD FINDINGS ANTERIOR CIRCULATION: Distal cervical segments of the internal carotid arteries are patent with symmetric antegrade flow. Petrous segments patent bilaterally. Scattered atheromatous irregularity within the cavernous/supraclinoid ICAs without high-grade stenosis, slightly worse on the right. A1 segments patent bilaterally. Normal anterior communicating artery. Anterior cerebral arteries widely patent to their distal aspects without stenosis. Right M1 widely patent. Normal right MCA bifurcation. There is focal occlusion of a proximal right M2 branch at the base of the sylvian fissure, corresponding with susceptibility artifacts seen on corresponding MRI (series 7, image 110). Right MCA branches otherwise perfused. Left M1 patent. Normal left MCA bifurcation. Distal left MCA branches well perfused. POSTERIOR CIRCULATION: Mild atheromatous irregularity within the vertebral arteries bilaterally without flow-limiting stenosis. Left vertebral artery dominant. Posterior inferior cerebral arteries patent bilaterally. Basilar patent to its distal aspect  without flow-limiting stenosis. Superior cerebral arteries patent bilaterally. Hypoplastic P1 segments with robust bilateral posterior communicating arteries. Scattered atheromatous irregularity throughout the PCAs without high-grade flow-limiting stenosis. Distal small vessel atheromatous irregularity seen throughout the intracranial circulation. No intracranial aneurysm. MRA NECK FINDINGS AORTIC ARCH: Visualized aortic arch of normal caliber with normal 3 vessel morphology. No hemodynamically significant stenosis seen about the origin of the great vessels. Visualized subclavian arteries widely patent. RIGHT CAROTID SYSTEM: Right common carotid artery widely patent from its origin to the bifurcation. Mild atheromatous irregularity about the right bifurcation and proximal right ICA without hemodynamically significant stenosis. Right ICA widely patent distally to the skull base without stenosis or occlusion. LEFT CAROTID SYSTEM: Left common carotid artery widely patent from its origin to the bifurcation without stenosis. Mild atheromatous irregularity about the left bifurcation/proximal left ICA without hemodynamically significant stenosis. Left ICA widely patent distally to the skull base without stenosis or occlusion. VERTEBRAL ARTERIES: Both vertebral arteries arise from the subclavian arteries. Vertebral arteries not well assessed proximally. Left vertebral artery dominant. Visualized vertebral arteries widely patent within the neck without stenosis or occlusion. IMPRESSION: MRI HEAD IMPRESSION: 1. Patchy small volume acute ischemic nonhemorrhagic right MCA territory infarct involving the right frontal operculum. 2. Underlying moderate chronic microvascular ischemic disease with scattered remote lacunar infarcts within the right basal ganglia and hemispheric cerebral white matter. 3. Superimposed chronic superior right cerebellar infarct. MRA HEAD IMPRESSION: 1. Acute right M2 occlusion, consistent with the right  MCA territory infarct. 2. Moderate atherosclerotic change elsewhere throughout the intracranial circulation. No other hemodynamically significant or correctable stenosis. MRA NECK IMPRESSION: 1. Mild atheromatous irregularity about the carotid bifurcations/proximal ICAs bilaterally without hemodynamically significant or critical flow limiting stenosis. Both carotid artery systems otherwise widely patent within the neck. 2. Wide patency of the vertebral arteries within the neck. Left vertebral artery dominant Electronically Signed   By: [name]  Jeannine Boga M.D.   On: 03/07/2019 05:24   Ct Head Code Stroke Wo Contrast  Result Date: 03/06/2019 CLINICAL DATA:  Code stroke. Slurred speech. Left-sided facial droop. Symptoms began 1900 hours. Headache. EXAM: CT HEAD WITHOUT CONTRAST TECHNIQUE: Contiguous axial images were obtained from the base of the skull through the vertex without intravenous contrast. COMPARISON:  MRI 12/20/2017.  CT 12/19/2017. FINDINGS: Brain: Old infarctions affect the right cerebellum. Cerebral hemispheres show early cortical low-density in the right frontal operculum. There is old infarction in the radiating white matter tracts on the right. No mass lesion, hemorrhage, hydrocephalus or extra-axial collection. Vascular: There is atherosclerotic calcification of the major vessels at the base of the brain. Skull: Normal Sinuses/Orbits: Clear/normal Other: None ASPECTS ([place] Stroke Program Early CT Score) - Ganglionic level infarction (caudate, lentiform nuclei, internal capsule, insula, M1-M3 cortex): 6 - Supraganglionic infarction (M4-M6 cortex): 3 Total score (0-10 with 10 being normal): 9 IMPRESSION: 1. Question loss of gray-white differentiation in the right frontal operculum, M1 sector. No evidence of hemorrhage or mass effect. Old infarctions in the right cerebellum and right radiating white matter tracts. 2. ASPECTS is 9 3. These results were called by telephone at the time of  interpretation on 03/06/2019 at 9:44 pm to Dr. Aletta Edouard , who verbally acknowledged these results. Electronically Signed   By: Nelson Chimes M.D.   On: 03/06/2019 21:47    EKG: Independently reviewed.  Atrial fibrillation at 84 bpm  Assessment/Plan CVA: Acute.  Patient presents with acute onset of left-sided weakness, facial droop, and slurred speech.  Patient not a candidate for TPA due to being on Coumadin.  MRI reveals acute right MCA stroke.  Neurology consulted.  - Admit to telemetry bed - Stroke order set initiated - Neuro checks - N.p.o. - PT/OT/Speech to evaluate and treat - Check echocardiogram - Check Hemoglobin A1c and lipid panel  - Appreciate neurology consultative services, will follow-up  -Social work consult   Paroxysmal atrial fibrillation, subtherapeutic INR: Patient with known history of atrial fibrillation. CHA2DS2-VASc score =7.  INR subtherapeutic at 1.6.  Suspect CVA secondary to subtherapeutic INR and atrial fibrillation. -Heparin bridge per neurology -Question restarting Coumadin versus Eliquis to be discussed with neurology  Diastolic CHF: Last EF noted to be 50 to 55% back in 01/2018.  Patient appears to be euvolemic at this time. - Follow-up repeat echocardiogram  Essential hypertension: Initial blood pressures elevated up to 213/92. -Hold blood pressure medications to allow for permissive hypertension at this time 220/110  Hyperlipidemia -Start atorvastatin 40 mg when able to pass swallow evaluation  CAD -Continue statin  History of PE: Patient on Coumadin, but INR subtherapeutic at this time.  DVT prophylaxis: Heparin Code Status: Full Family Communication: Son not available by phone Disposition Plan: To be determined Consults called: Neurology Admission status: Inpatient  Norval Morton MD [facility] Pager [phone]   If 7PM-7AM, please contact night-coverage [email or web address] Password [facility]  03/07/2019, 8:34 AM

## 2019-03-07 NOTE — Progress Notes (Signed)
Code stroke  Call time 2121 Beeper  2119 Started  2129 Ended  2131 Soc  2132 Rad  2132 Epic  2133

## 2019-03-07 NOTE — ED Notes (Signed)
ED TO INPATIENT HANDOFF REPORT  ED Nurse Name and Phone #: Burman Nieves [phone]  S Name/Age/Gender [name] 78 y.o. female Room/Bed: H021C/H021C  Code Status   Code Status: Full Code  Home/SNF/Other Home Patient oriented to: self, place, time and situation Is this baseline? Yes   Triage Complete: Triage complete  Chief Complaint HIgh Blood Pressure  Triage Note Pt with sudden onset of slurred speech and left side weakness onset 7 pm   Allergies Allergies  Allergen Reactions  . Iodinated Diagnostic Agents Anaphylaxis and Other (See Comments)    IPD dye Info given by patient  . Ioxaglate Anaphylaxis and Other (See Comments)    Info given by patient  . Red Dye Anaphylaxis  . Lactose Intolerance (Gi)   . Whey Other (See Comments)    Lactose intolerance  . Darvon [Propoxyphene] Rash  . Hydralazine Anxiety and Other (See Comments)    Facial flushing, pt prefers not to use it.   . Milk-Related Compounds Other (See Comments)    Lactose intolerance Can tolerate milk if its cooked into the recipe, just can't drink milk     Level of Care/Admitting Diagnosis ED Disposition    ED Disposition Condition [facility] Area: [place] [[id number]]  Level of Care: Telemetry Medical [104]  Covid Evaluation: Confirmed COVID Negative  [facility]: [id number]  Admitting Physician: Ivor Costa [[id number]]  Attending Physician: Ivor Costa [phone]  Estimated length of stay: past midnight tomorrow  Certification:: I certify this patient will need inpatient services for at least 2 midnights  PT Class (Do Not Modify): Inpatient [101]  PT Acc Code (Do Not Modify): Private [1]       B Medical/Surgery History Past Medical History:  Diagnosis Date  . Anemia 2005   Generally microcytic, transfusions in [address], 2012, 02/2015, 05/2015  . Aortic stenosis    Moderate November 2017  . Arthritis   . Broken back 2013   Chronic back pain.   [name] CAD  (coronary artery disease)    Cardiac catheterization 2014 - 80% mid RCA and 70% OM managed medically  . CHF (congestive heart failure) ([place])   . Chronic bilateral pleural effusions   . Chronic headache   . Contrast media allergy   . Diastolic heart failure ([place])   . Esophageal cancer ([place]) 05/06/2015   Adenocarcinoma GE junction  . Facial paresthesia   . GERD (gastroesophageal reflux disease)   . H. pylori infection   . H/O iron deficiency    05-06-15 iron infusion ([facility])  . HH (hiatus hernia) 2008   Large with associated erosions  . Hypertension   . Paroxysmal atrial fibrillation ([name])   . PONV (postoperative nausea and vomiting)   . Pulmonary emboli ([place]) 2008, 2012  . Stroke [facility]) 1982   Past Surgical History:  Procedure Laterality Date  . APPENDECTOMY  1950s  . BACK SURGERY    . CARPAL TUNNEL RELEASE Bilateral 1990s  . [place] SURGERY  2014  . CHOLECYSTECTOMY  1980s   open  . COLONOSCOPY N/A 02/17/2015   Procedure: COLONOSCOPY;  Surgeon: [name], MD;  Location: [facility] ENDOSCOPY;  Service: Endoscopy;  Laterality: N/A;  . ESOPHAGOGASTRODUODENOSCOPY N/A 02/16/2015   Procedure: ESOPHAGOGASTRODUODENOSCOPY (EGD);  Surgeon: [name], MD;  Location: [facility];  Service: Endoscopy;  Laterality: N/A;  . ESOPHAGOGASTRODUODENOSCOPY N/A 05/06/2015   Procedure: ESOPHAGOGASTRODUODENOSCOPY (EGD);  Surgeon: [name], MD;  Location: [facility] ENDOSCOPY;  Service: Endoscopy;  Laterality:  N/A;  . EUS N/A 05/20/2015   Procedure: UPPER ENDOSCOPIC ULTRASOUND (EUS) LINEAR;  Surgeon: [name], MD;  Location: [facility] ENDOSCOPY;  Service: Endoscopy;  Laterality: N/A;  . exploratory lab  1950s or 1960s  . GIVENS CAPSULE STUDY N/A 05/06/2015   Procedure: GIVENS CAPSULE STUDY;  Surgeon: [name], MD;  Location: [facility] ENDOSCOPY;  Service: Endoscopy;  Laterality: N/A;  . KNEE ARTHROSCOPY WITH MEDIAL MENISECTOMY Left 04/18/2018   Procedure: LEFT KNEE ARTHROSCOPY WITH PARTIAL MEDIAL  MENISECTOMY AND ANTERIOR CRUCIATE LIGAMENT DEBRIDEMENT;  Surgeon: [name], MD;  Location: [facility] ORS;  Service: Orthopedics;  Laterality: Left;  . LEFT HEART CATH AND CORONARY ANGIOGRAPHY N/A 11/08/2016   Procedure: Left Heart Cath and Coronary Angiography;  Surgeon: [name], MD;  Location: [facility] CV LAB;  Service: Cardiovascular;  Laterality: N/A;  . lumbar back surgery  2012  . [place] SURGERY  1991  . TONSILLECTOMY    . TONSILLECTOMY AND ADENOIDECTOMY  1960s     A IV Location/Drains/Wounds Patient Lines/Drains/Airways Status   Active Line/Drains/Airways    Name:   Placement date:   Placement time:   Site:   Days:   Peripheral IV 03/07/19 Left Forearm   03/07/19    0609    Forearm   less than 1   External Urinary Catheter   04/17/18    -    -   324   External Urinary Catheter   03/06/19    2326    -   1   Incision (Closed) 04/18/18 Knee Left   04/18/18    1439     323          Intake/Output Last 24 hours No intake or output data in the 24 hours ending 03/07/19 1056  Labs/Imaging Results for orders placed or performed during the hospital encounter of 03/06/19 (from the past 48 hour(s))  CBG monitoring, ED     Status: Abnormal   Collection Time: 03/06/19  9:31 PM  Result Value Ref Range   Glucose-Capillary 164 (H) 70 - 99 mg/dL  Ethanol     Status: None   Collection Time: 03/06/19  9:32 PM  Result Value Ref Range   Alcohol, Ethyl (B) <10 <10 mg/dL    Comment: (NOTE) Lowest detectable limit for serum alcohol is 10 mg/dL. For medical purposes only. Performed at [facility], [address] [place], [place] [address]   Protime-INR     Status: Abnormal   Collection Time: 03/06/19  9:32 PM  Result Value Ref Range   Prothrombin Time 18.5 (H) 11.4 - 15.2 seconds   INR 1.6 (H) 0.8 - 1.2    Comment: (NOTE) INR goal varies based on device and disease states. Performed at [facility], [address] [place], [place] [address]   APTT     Status: None    Collection Time: 03/06/19  9:32 PM  Result Value Ref Range   aPTT 35 24 - 36 seconds    Comment: Performed at [facility], [address], [place], [place] [address]  Comprehensive metabolic panel     Status: Abnormal   Collection Time: 03/06/19  9:32 PM  Result Value Ref Range   Sodium 139 135 - 145 mmol/L   Potassium 4.1 3.5 - 5.1 mmol/L   Chloride 102 98 - 111 mmol/L   CO2 30 22 - 32 mmol/L   Glucose, Bld 174 (H) 70 - 99 mg/dL   BUN 13 8 - 23  mg/dL   Creatinine, Ser 0.90 0.44 - 1.00 mg/dL   Calcium 9.0 8.9 - 10.3 mg/dL   Total Protein 7.2 6.5 - 8.1 g/dL   Albumin 4.0 3.5 - 5.0 g/dL   AST 17 15 - 41 U/L   ALT 15 0 - 44 U/L   Alkaline Phosphatase 89 38 - 126 U/L   Total Bilirubin 1.0 0.3 - 1.2 mg/dL   GFR calc non Af Amer >60 >60 mL/min   GFR calc Af Amer >60 >60 mL/min   Anion gap 7 5 - 15    Comment: Performed at [facility], [address] [place], [place] [address]  CBC     Status: None   Collection Time: 03/06/19  9:33 PM  Result Value Ref Range   WBC 6.5 4.0 - 10.5 K/uL   RBC 4.87 3.87 - 5.11 MIL/uL   Hemoglobin 14.2 12.0 - 15.0 g/dL   HCT 42.6 36.0 - 46.0 %   MCV 87.5 80.0 - 100.0 fL   MCH 29.2 26.0 - 34.0 pg   MCHC 33.3 30.0 - 36.0 g/dL   RDW 12.8 11.5 - 15.5 %   Platelets 171 150 - 400 K/uL   nRBC 0.0 0.0 - 0.2 %    Comment: Performed at [facility], [address], [place], [place] [address]  Differential     Status: None   Collection Time: 03/06/19  9:33 PM  Result Value Ref Range   Neutrophils Relative % 61 %   Neutro Abs 3.9 1.7 - 7.7 K/uL   Lymphocytes Relative 30 %   Lymphs Abs 1.9 0.7 - 4.0 K/uL   Monocytes Relative 8 %   Monocytes Absolute 0.5 0.1 - 1.0 K/uL   Eosinophils Relative 1 %   Eosinophils Absolute 0.1 0.0 - 0.5 K/uL   Basophils Relative 0 %   Basophils Absolute 0.0 0.0 - 0.1 K/uL   Immature Granulocytes 0 %   Abs Immature Granulocytes 0.02 0.00 - 0.07 K/uL    Comment: Performed at [facility], [address], [place], [place]  [address]  I-stat chem 8, ED     Status: Abnormal   Collection Time: 03/06/19  9:37 PM  Result Value Ref Range   Sodium 140 135 - 145 mmol/L   Potassium 4.0 3.5 - 5.1 mmol/L   Chloride 100 98 - 111 mmol/L   BUN 14 8 - 23 mg/dL   Creatinine, Ser 1.00 0.44 - 1.00 mg/dL   Glucose, Bld 168 (H) 70 - 99 mg/dL   Calcium, Ion 1.17 1.15 - 1.40 mmol/L   TCO2 29 22 - 32 mmol/L   Hemoglobin 14.6 12.0 - 15.0 g/dL   HCT 43.0 36.0 - 46.0 %  SARS Coronavirus 2 [facility] order, Performed in [facility] hospital lab) Nasopharyngeal Nasopharyngeal Swab     Status: None   Collection Time: 03/06/19  9:40 PM   Specimen: Nasopharyngeal Swab  Result Value Ref Range   SARS Coronavirus 2 NEGATIVE NEGATIVE    Comment: (NOTE) If result is NEGATIVE SARS-CoV-2 target nucleic acids are NOT DETECTED. The SARS-CoV-2 RNA is generally detectable in upper and lower  respiratory specimens during the acute phase of infection. The lowest  concentration of SARS-CoV-2 viral copies this assay can detect is 250  copies / mL. A negative result does not preclude SARS-CoV-2 infection  and should not be used as the sole basis for treatment or other  patient management decisions.  A negative result may occur with  improper specimen collection /  handling, submission of specimen other  than nasopharyngeal swab, presence of viral mutation(s) within the  areas targeted by this assay, and inadequate number of viral copies  (<250 copies / mL). A negative result must be combined with clinical  observations, patient history, and epidemiological information. If result is POSITIVE SARS-CoV-2 target nucleic acids are DETECTED. The SARS-CoV-2 RNA is generally detectable in upper and lower  respiratory specimens dur ing the acute phase of infection.  Positive  results are indicative of active infection with SARS-CoV-2.  Clinical  correlation with patient history and other diagnostic information is  necessary to determine patient infection  status.  Positive results do  not rule out bacterial infection or co-infection with other viruses. If result is PRESUMPTIVE POSTIVE SARS-CoV-2 nucleic acids MAY BE PRESENT.   A presumptive positive result was obtained on the submitted specimen  and confirmed on repeat testing.  While 2019 novel coronavirus  (SARS-CoV-2) nucleic acids may be present in the submitted sample  additional confirmatory testing may be necessary for epidemiological  and / or clinical management purposes  to differentiate between  SARS-CoV-2 and other Sarbecovirus currently known to infect humans.  If clinically indicated additional testing with an alternate test  methodology [phone]) is advised. The SARS-CoV-2 RNA is generally  detectable in upper and lower respiratory sp ecimens during the acute  phase of infection. The expected result is Negative. Fact Sheet for Patients:  StrictlyIdeas.no Fact Sheet for Healthcare Providers: BankingDealers.co.za This test is not yet approved or cleared by the [place] FDA and has been authorized for detection and/or diagnosis of SARS-CoV-2 by FDA under an Emergency Use Authorization (EUA).  This EUA will remain in effect (meaning this test can be used) for the duration of the COVID-19 declaration under Section 564(b)(1) of the Act, 21 U.S.C. section 360bbb-3(b)(1), unless the authorization is terminated or revoked sooner. Performed at [facility], [address], [place], [place] [address]   Urine rapid drug screen (hosp performed)     Status: Abnormal   Collection Time: 03/06/19 11:26 PM  Result Value Ref Range   Opiates NONE DETECTED NONE DETECTED   Cocaine NONE DETECTED NONE DETECTED   Benzodiazepines POSITIVE (A) NONE DETECTED   Amphetamines NONE DETECTED NONE DETECTED   Tetrahydrocannabinol NONE DETECTED NONE DETECTED   Barbiturates NONE DETECTED NONE DETECTED    Comment: (NOTE) DRUG SCREEN FOR MEDICAL  PURPOSES ONLY.  IF CONFIRMATION IS NEEDED FOR ANY PURPOSE, NOTIFY LAB WITHIN 5 DAYS. LOWEST DETECTABLE LIMITS FOR URINE DRUG SCREEN Drug Class                     Cutoff (ng/mL) Amphetamine and metabolites    1000 Barbiturate and metabolites    200 Benzodiazepine                 [id number] Tricyclics and metabolites     300 Opiates and metabolites        300 Cocaine and metabolites        300 THC                            50 Performed at [facility], [address], [place], [place] [address]   Urinalysis, Routine w reflex microscopic     Status: Abnormal   Collection Time: 03/06/19 11:26 PM  Result Value Ref Range   Color, Urine YELLOW YELLOW   APPearance CLEAR CLEAR   Specific Gravity, Urine 1.009 1.005 -  1.030   pH 8.0 5.0 - 8.0   Glucose, UA NEGATIVE NEGATIVE mg/dL   Hgb urine dipstick MODERATE (A) NEGATIVE   Bilirubin Urine NEGATIVE NEGATIVE   Ketones, ur NEGATIVE NEGATIVE mg/dL   Protein, ur NEGATIVE NEGATIVE mg/dL   Nitrite NEGATIVE NEGATIVE   Leukocytes,Ua NEGATIVE NEGATIVE   RBC / HPF 0-5 0 - 5 RBC/hpf   WBC, UA 0-5 0 - 5 WBC/hpf   Bacteria, UA NONE SEEN NONE SEEN   Squamous Epithelial / LPF 0-5 0 - 5    Comment: Performed at [facility], [address] [place], [place] [address]   Mr Angio Head Wo Contrast  Result Date: 03/07/2019 CLINICAL DATA:  Initial evaluation for acute speech difficulty, stroke-like symptoms. EXAM: MRI HEAD WITHOUT CONTRAST MRA HEAD WITHOUT CONTRAST MRA NECK WITHOUT CONTRAST TECHNIQUE: Multiplanar, multiecho pulse sequences of the brain and surrounding structures were obtained without intravenous contrast. Angiographic images of the Circle of Willis were obtained using MRA technique without intravenous contrast. Angiographic images of the neck were obtained using MRA technique without intravenous contrast. Carotid stenosis measurements (when applicable) are obtained utilizing NASCET criteria, using the distal internal carotid diameter as the  denominator. COMPARISON:  Prior head CT from 03/06/2019. FINDINGS: MRI HEAD FINDINGS Brain: Generalized age-related cerebral atrophy. Patchy and confluent T2/FLAIR hyperintensity within the periventricular and deep white matter both cerebral hemispheres most consistent with chronic small vessel ischemic disease, mild in nature. Several scattered superimposed remote lacunar infarcts seen involving the right basal ganglia and hemispheric cerebral white matter. Chronic superior right cerebellar infarct noted. Patchy small volume restricted diffusion seen involving the cortical and subcortical right frontal operculum, consistent with acute right MCA territory infarct. No associated hemorrhage or mass effect. No other evidence for acute or subacute ischemia. No acute intracranial hemorrhage. No mass lesion, midline shift or mass effect. No hydrocephalus. No extra-axial fluid collection. Pituitary gland suprasellar region normal. Midline structures intact. Vascular: Focus of susceptibility artifact seen at the inferior right sylvian fissure, suspected to reflect thrombus in a right M2 branch, seen on corresponding MRA (series 18, image 28). Major intracranial vascular flow voids are otherwise maintained. Skull and upper cervical spine: Craniocervical junction within normal limits. Postsurgical changes partially visualize within the upper cervical spine. Bone marrow signal intensity normal. No scalp soft tissue abnormality. Sinuses/Orbits: Patient status post bilateral ocular lens replacement. Paranasal sinuses are clear. No significant mastoid effusion. Inner ear structures normal. Other: None. MRA HEAD FINDINGS ANTERIOR CIRCULATION: Distal cervical segments of the internal carotid arteries are patent with symmetric antegrade flow. Petrous segments patent bilaterally. Scattered atheromatous irregularity within the cavernous/supraclinoid ICAs without high-grade stenosis, slightly worse on the right. A1 segments patent  bilaterally. Normal anterior communicating artery. Anterior cerebral arteries widely patent to their distal aspects without stenosis. Right M1 widely patent. Normal right MCA bifurcation. There is focal occlusion of a proximal right M2 branch at the base of the sylvian fissure, corresponding with susceptibility artifacts seen on corresponding MRI (series 7, image 110). Right MCA branches otherwise perfused. Left M1 patent. Normal left MCA bifurcation. Distal left MCA branches well perfused. POSTERIOR CIRCULATION: Mild atheromatous irregularity within the vertebral arteries bilaterally without flow-limiting stenosis. Left vertebral artery dominant. Posterior inferior cerebral arteries patent bilaterally. Basilar patent to its distal aspect without flow-limiting stenosis. Superior cerebral arteries patent bilaterally. Hypoplastic P1 segments with robust bilateral posterior communicating arteries. Scattered atheromatous irregularity throughout the PCAs without high-grade flow-limiting stenosis. Distal small vessel atheromatous irregularity seen throughout the intracranial circulation. No intracranial aneurysm.  MRA NECK FINDINGS AORTIC ARCH: Visualized aortic arch of normal caliber with normal 3 vessel morphology. No hemodynamically significant stenosis seen about the origin of the great vessels. Visualized subclavian arteries widely patent. RIGHT CAROTID SYSTEM: Right common carotid artery widely patent from its origin to the bifurcation. Mild atheromatous irregularity about the right bifurcation and proximal right ICA without hemodynamically significant stenosis. Right ICA widely patent distally to the skull base without stenosis or occlusion. LEFT CAROTID SYSTEM: Left common carotid artery widely patent from its origin to the bifurcation without stenosis. Mild atheromatous irregularity about the left bifurcation/proximal left ICA without hemodynamically significant stenosis. Left ICA widely patent distally to the  skull base without stenosis or occlusion. VERTEBRAL ARTERIES: Both vertebral arteries arise from the subclavian arteries. Vertebral arteries not well assessed proximally. Left vertebral artery dominant. Visualized vertebral arteries widely patent within the neck without stenosis or occlusion. IMPRESSION: MRI HEAD IMPRESSION: 1. Patchy small volume acute ischemic nonhemorrhagic right MCA territory infarct involving the right frontal operculum. 2. Underlying moderate chronic microvascular ischemic disease with scattered remote lacunar infarcts within the right basal ganglia and hemispheric cerebral white matter. 3. Superimposed chronic superior right cerebellar infarct. MRA HEAD IMPRESSION: 1. Acute right M2 occlusion, consistent with the right MCA territory infarct. 2. Moderate atherosclerotic change elsewhere throughout the intracranial circulation. No other hemodynamically significant or correctable stenosis. MRA NECK IMPRESSION: 1. Mild atheromatous irregularity about the carotid bifurcations/proximal ICAs bilaterally without hemodynamically significant or critical flow limiting stenosis. Both carotid artery systems otherwise widely patent within the neck. 2. Wide patency of the vertebral arteries within the neck. Left vertebral artery dominant Electronically Signed   By: Jeannine Boga M.D.   On: 03/07/2019 05:24   Mr Angio Neck Wo Contrast  Result Date: 03/07/2019 CLINICAL DATA:  Initial evaluation for acute speech difficulty, stroke-like symptoms. EXAM: MRI HEAD WITHOUT CONTRAST MRA HEAD WITHOUT CONTRAST MRA NECK WITHOUT CONTRAST TECHNIQUE: Multiplanar, multiecho pulse sequences of the brain and surrounding structures were obtained without intravenous contrast. Angiographic images of the Circle of Willis were obtained using MRA technique without intravenous contrast. Angiographic images of the neck were obtained using MRA technique without intravenous contrast. Carotid stenosis measurements (when  applicable) are obtained utilizing NASCET criteria, using the distal internal carotid diameter as the denominator. COMPARISON:  Prior head CT from 03/06/2019. FINDINGS: MRI HEAD FINDINGS Brain: Generalized age-related cerebral atrophy. Patchy and confluent T2/FLAIR hyperintensity within the periventricular and deep white matter both cerebral hemispheres most consistent with chronic small vessel ischemic disease, mild in nature. Several scattered superimposed remote lacunar infarcts seen involving the right basal ganglia and hemispheric cerebral white matter. Chronic superior right cerebellar infarct noted. Patchy small volume restricted diffusion seen involving the cortical and subcortical right frontal operculum, consistent with acute right MCA territory infarct. No associated hemorrhage or mass effect. No other evidence for acute or subacute ischemia. No acute intracranial hemorrhage. No mass lesion, midline shift or mass effect. No hydrocephalus. No extra-axial fluid collection. Pituitary gland suprasellar region normal. Midline structures intact. Vascular: Focus of susceptibility artifact seen at the inferior right sylvian fissure, suspected to reflect thrombus in a right M2 branch, seen on corresponding MRA (series 18, image 28). Major intracranial vascular flow voids are otherwise maintained. Skull and upper cervical spine: Craniocervical junction within normal limits. Postsurgical changes partially visualize within the upper cervical spine. Bone marrow signal intensity normal. No scalp soft tissue abnormality. Sinuses/Orbits: Patient status post bilateral ocular lens replacement. Paranasal sinuses are clear. No significant mastoid effusion. Inner ear  structures normal. Other: None. MRA HEAD FINDINGS ANTERIOR CIRCULATION: Distal cervical segments of the internal carotid arteries are patent with symmetric antegrade flow. Petrous segments patent bilaterally. Scattered atheromatous irregularity within the  cavernous/supraclinoid ICAs without high-grade stenosis, slightly worse on the right. A1 segments patent bilaterally. Normal anterior communicating artery. Anterior cerebral arteries widely patent to their distal aspects without stenosis. Right M1 widely patent. Normal right MCA bifurcation. There is focal occlusion of a proximal right M2 branch at the base of the sylvian fissure, corresponding with susceptibility artifacts seen on corresponding MRI (series 7, image 110). Right MCA branches otherwise perfused. Left M1 patent. Normal left MCA bifurcation. Distal left MCA branches well perfused. POSTERIOR CIRCULATION: Mild atheromatous irregularity within the vertebral arteries bilaterally without flow-limiting stenosis. Left vertebral artery dominant. Posterior inferior cerebral arteries patent bilaterally. Basilar patent to its distal aspect without flow-limiting stenosis. Superior cerebral arteries patent bilaterally. Hypoplastic P1 segments with robust bilateral posterior communicating arteries. Scattered atheromatous irregularity throughout the PCAs without high-grade flow-limiting stenosis. Distal small vessel atheromatous irregularity seen throughout the intracranial circulation. No intracranial aneurysm. MRA NECK FINDINGS AORTIC ARCH: Visualized aortic arch of normal caliber with normal 3 vessel morphology. No hemodynamically significant stenosis seen about the origin of the great vessels. Visualized subclavian arteries widely patent. RIGHT CAROTID SYSTEM: Right common carotid artery widely patent from its origin to the bifurcation. Mild atheromatous irregularity about the right bifurcation and proximal right ICA without hemodynamically significant stenosis. Right ICA widely patent distally to the skull base without stenosis or occlusion. LEFT CAROTID SYSTEM: Left common carotid artery widely patent from its origin to the bifurcation without stenosis. Mild atheromatous irregularity about the left  bifurcation/proximal left ICA without hemodynamically significant stenosis. Left ICA widely patent distally to the skull base without stenosis or occlusion. VERTEBRAL ARTERIES: Both vertebral arteries arise from the subclavian arteries. Vertebral arteries not well assessed proximally. Left vertebral artery dominant. Visualized vertebral arteries widely patent within the neck without stenosis or occlusion. IMPRESSION: MRI HEAD IMPRESSION: 1. Patchy small volume acute ischemic nonhemorrhagic right MCA territory infarct involving the right frontal operculum. 2. Underlying moderate chronic microvascular ischemic disease with scattered remote lacunar infarcts within the right basal ganglia and hemispheric cerebral white matter. 3. Superimposed chronic superior right cerebellar infarct. MRA HEAD IMPRESSION: 1. Acute right M2 occlusion, consistent with the right MCA territory infarct. 2. Moderate atherosclerotic change elsewhere throughout the intracranial circulation. No other hemodynamically significant or correctable stenosis. MRA NECK IMPRESSION: 1. Mild atheromatous irregularity about the carotid bifurcations/proximal ICAs bilaterally without hemodynamically significant or critical flow limiting stenosis. Both carotid artery systems otherwise widely patent within the neck. 2. Wide patency of the vertebral arteries within the neck. Left vertebral artery dominant Electronically Signed   By: Jeannine Boga M.D.   On: 03/07/2019 05:24   Mr Brain Wo Contrast  Result Date: 03/07/2019 CLINICAL DATA:  Initial evaluation for acute speech difficulty, stroke-like symptoms. EXAM: MRI HEAD WITHOUT CONTRAST MRA HEAD WITHOUT CONTRAST MRA NECK WITHOUT CONTRAST TECHNIQUE: Multiplanar, multiecho pulse sequences of the brain and surrounding structures were obtained without intravenous contrast. Angiographic images of the Circle of Willis were obtained using MRA technique without intravenous contrast. Angiographic images of the  neck were obtained using MRA technique without intravenous contrast. Carotid stenosis measurements (when applicable) are obtained utilizing NASCET criteria, using the distal internal carotid diameter as the denominator. COMPARISON:  Prior head CT from 03/06/2019. FINDINGS: MRI HEAD FINDINGS Brain: Generalized age-related cerebral atrophy. Patchy and confluent T2/FLAIR hyperintensity within the periventricular and  deep white matter both cerebral hemispheres most consistent with chronic small vessel ischemic disease, mild in nature. Several scattered superimposed remote lacunar infarcts seen involving the right basal ganglia and hemispheric cerebral white matter. Chronic superior right cerebellar infarct noted. Patchy small volume restricted diffusion seen involving the cortical and subcortical right frontal operculum, consistent with acute right MCA territory infarct. No associated hemorrhage or mass effect. No other evidence for acute or subacute ischemia. No acute intracranial hemorrhage. No mass lesion, midline shift or mass effect. No hydrocephalus. No extra-axial fluid collection. Pituitary gland suprasellar region normal. Midline structures intact. Vascular: Focus of susceptibility artifact seen at the inferior right sylvian fissure, suspected to reflect thrombus in a right M2 branch, seen on corresponding MRA (series 18, image 28). Major intracranial vascular flow voids are otherwise maintained. Skull and upper cervical spine: Craniocervical junction within normal limits. Postsurgical changes partially visualize within the upper cervical spine. Bone marrow signal intensity normal. No scalp soft tissue abnormality. Sinuses/Orbits: Patient status post bilateral ocular lens replacement. Paranasal sinuses are clear. No significant mastoid effusion. Inner ear structures normal. Other: None. MRA HEAD FINDINGS ANTERIOR CIRCULATION: Distal cervical segments of the internal carotid arteries are patent with symmetric  antegrade flow. Petrous segments patent bilaterally. Scattered atheromatous irregularity within the cavernous/supraclinoid ICAs without high-grade stenosis, slightly worse on the right. A1 segments patent bilaterally. Normal anterior communicating artery. Anterior cerebral arteries widely patent to their distal aspects without stenosis. Right M1 widely patent. Normal right MCA bifurcation. There is focal occlusion of a proximal right M2 branch at the base of the sylvian fissure, corresponding with susceptibility artifacts seen on corresponding MRI (series 7, image 110). Right MCA branches otherwise perfused. Left M1 patent. Normal left MCA bifurcation. Distal left MCA branches well perfused. POSTERIOR CIRCULATION: Mild atheromatous irregularity within the vertebral arteries bilaterally without flow-limiting stenosis. Left vertebral artery dominant. Posterior inferior cerebral arteries patent bilaterally. Basilar patent to its distal aspect without flow-limiting stenosis. Superior cerebral arteries patent bilaterally. Hypoplastic P1 segments with robust bilateral posterior communicating arteries. Scattered atheromatous irregularity throughout the PCAs without high-grade flow-limiting stenosis. Distal small vessel atheromatous irregularity seen throughout the intracranial circulation. No intracranial aneurysm. MRA NECK FINDINGS AORTIC ARCH: Visualized aortic arch of normal caliber with normal 3 vessel morphology. No hemodynamically significant stenosis seen about the origin of the great vessels. Visualized subclavian arteries widely patent. RIGHT CAROTID SYSTEM: Right common carotid artery widely patent from its origin to the bifurcation. Mild atheromatous irregularity about the right bifurcation and proximal right ICA without hemodynamically significant stenosis. Right ICA widely patent distally to the skull base without stenosis or occlusion. LEFT CAROTID SYSTEM: Left common carotid artery widely patent from its  origin to the bifurcation without stenosis. Mild atheromatous irregularity about the left bifurcation/proximal left ICA without hemodynamically significant stenosis. Left ICA widely patent distally to the skull base without stenosis or occlusion. VERTEBRAL ARTERIES: Both vertebral arteries arise from the subclavian arteries. Vertebral arteries not well assessed proximally. Left vertebral artery dominant. Visualized vertebral arteries widely patent within the neck without stenosis or occlusion. IMPRESSION: MRI HEAD IMPRESSION: 1. Patchy small volume acute ischemic nonhemorrhagic right MCA territory infarct involving the right frontal operculum. 2. Underlying moderate chronic microvascular ischemic disease with scattered remote lacunar infarcts within the right basal ganglia and hemispheric cerebral white matter. 3. Superimposed chronic superior right cerebellar infarct. MRA HEAD IMPRESSION: 1. Acute right M2 occlusion, consistent with the right MCA territory infarct. 2. Moderate atherosclerotic change elsewhere throughout the intracranial circulation. No other hemodynamically significant  or correctable stenosis. MRA NECK IMPRESSION: 1. Mild atheromatous irregularity about the carotid bifurcations/proximal ICAs bilaterally without hemodynamically significant or critical flow limiting stenosis. Both carotid artery systems otherwise widely patent within the neck. 2. Wide patency of the vertebral arteries within the neck. Left vertebral artery dominant Electronically Signed   By: Jeannine Boga M.D.   On: 03/07/2019 05:24   Ct Head Code Stroke Wo Contrast  Result Date: 03/06/2019 CLINICAL DATA:  Code stroke. Slurred speech. Left-sided facial droop. Symptoms began 1900 hours. Headache. EXAM: CT HEAD WITHOUT CONTRAST TECHNIQUE: Contiguous axial images were obtained from the base of the skull through the vertex without intravenous contrast. COMPARISON:  MRI 12/20/2017.  CT 12/19/2017. FINDINGS: Brain: Old  infarctions affect the right cerebellum. Cerebral hemispheres show early cortical low-density in the right frontal operculum. There is old infarction in the radiating white matter tracts on the right. No mass lesion, hemorrhage, hydrocephalus or extra-axial collection. Vascular: There is atherosclerotic calcification of the major vessels at the base of the brain. Skull: Normal Sinuses/Orbits: Clear/normal Other: None ASPECTS ([place] Stroke Program Early CT Score) - Ganglionic level infarction (caudate, lentiform nuclei, internal capsule, insula, M1-M3 cortex): 6 - Supraganglionic infarction (M4-M6 cortex): 3 Total score (0-10 with 10 being normal): 9 IMPRESSION: 1. Question loss of gray-white differentiation in the right frontal operculum, M1 sector. No evidence of hemorrhage or mass effect. Old infarctions in the right cerebellum and right radiating white matter tracts. 2. ASPECTS is 9 3. These results were called by telephone at the time of interpretation on 03/06/2019 at 9:44 pm to Dr. Aletta Edouard , who verbally acknowledged these results. Electronically Signed   By: Nelson Chimes M.D.   On: 03/06/2019 21:47    Pending Labs Unresulted Labs (From admission, onward)    Start     Ordered   03/08/19 0500  Hemoglobin A1c  Tomorrow morning,   R     03/07/19 0849   03/08/19 0500  Lipid panel  Tomorrow morning,   R    Comments: Fasting    03/07/19 0849   03/08/19 0500  Heparin level (unfractionated)  Daily,   R     03/07/19 1047   03/08/19 0500  CBC  Daily,   R     03/07/19 1047   03/07/19 1930  Heparin level (unfractionated)  Once-Timed,   STAT     03/07/19 1047          Vitals/Pain Today's Vitals   03/07/19 0019 03/07/19 0030 03/07/19 0131 03/07/19 0744  BP: (!) 171/74 (!) 187/78 (!) 213/92 (!) 165/72  Pulse: (!) 40 73 70 82  Resp: 19 20 18 16   Temp:   97.6 F (36.4 C)   TempSrc:   Oral   SpO2: 97% 96% 98% 97%  Weight:      Height:        Isolation Precautions No active  isolations  Medications Medications  oxyCODONE (Oxy IR/ROXICODONE) immediate release tablet 5 mg (has no administration in time range)  traMADol (ULTRAM) tablet 50 mg (has no administration in time range)  temazepam (RESTORIL) capsule 15 mg (has no administration in time range)  meclizine (ANTIVERT) tablet 25 mg (has no administration in time range)  pantoprazole (PROTONIX) EC tablet 40 mg (40 mg Oral Not Given 03/07/19 [id number])   stroke: mapping our early stages of recovery book (has no administration in time range)  acetaminophen (TYLENOL) tablet 650 mg (has no administration in time range)    Or  acetaminophen (TYLENOL) suppository  650 mg (has no administration in time range)  heparin ADULT infusion 100 units/mL (25000 units/259mL sodium chloride 0.45%) (650 Units/hr Intravenous New Bag/Given 03/07/19 1056)  0.9 %  sodium chloride infusion ( Intravenous Stopped 03/07/19 1049)    Mobility walks Moderate fall risk   Focused Assessments Neuro Assessment Handoff:  Swallow screen pass? No  Cardiac Rhythm: Atrial fibrillation NIH Stroke Scale ( + Modified Stroke Scale Criteria)  Interval: Shift assessment Level of Consciousness (1a.)   : Alert, keenly responsive LOC Questions (1b. )   +: Answers both questions correctly LOC Commands (1c. )   + : Performs both tasks correctly Best Gaze (2. )  +: Normal Visual (3. )  +: No visual loss Facial Palsy (4. )    : Minor paralysis Motor Arm, Left (5a. )   +: No drift Motor Arm, Right (5b. )   +: No drift Motor Leg, Left (6a. )   +: No drift Motor Leg, Right (6b. )   +: No drift Limb Ataxia (7. ): Absent Sensory (8. )   +: Normal, no sensory loss Best Language (9. )   +: Severe aphasia Dysarthria (10. ): Normal Extinction/Inattention (11.)   +: No Abnormality Modified SS Total  +: 2 Complete NIHSS TOTAL: 3 Last date known well: 03/06/19 Last time known well: 1800 Neuro Assessment: Exceptions to [facility] Neuro Checks:   Initial (03/06/19  2157)  Last Documented NIHSS Modified Score: 2 (03/07/19 0800) Has TPA been given? No If patient is a Neuro Trauma and patient is going to OR before floor call report to [facility] nurse: [phone] or [phone]     R Recommendations: See Admitting Provider Note  Report given to:   Additional Notes:

## 2019-03-07 NOTE — ED Notes (Signed)
Pt placed on cardiac monitor and ambulated well to bathroom

## 2019-03-07 NOTE — Progress Notes (Signed)
STROKE TEAM PROGRESS NOTE    78 yo F with hx of CHF, CAD, Afib on home coumadin, PE, prior CVA, aortic stenosis, and HTN, transferred from [facility] after presenting with suddent onset slurred speech and L facial droop. Did not receive tPA d/t home anticoagulation, INR subtherapeautic on presentation (1.6). NIHSS 2. Patient was neg for LVO. MRI showed small R ischemic infarct of R MCA territory and MRA significant for acute R M2 occlusion.  INTERVAL HISTORY Admitted for stroke work up. PT/OT/ST therapy eval pending. INR remains subtherapeutic at 1.6, continues to be dysarthric with mild facial droop. Patient to be started on heparin with plans for eventual coumadin bridge given her underlying afib.  Vitals:   03/07/19 0131 03/07/19 0744 03/07/19 1117 03/07/19 1130  BP: (!) 213/92 (!) 165/72 (!) 168/77 (!) 106/92  Pulse: 70 82 91 (!) 108  Resp: 18 16 16 16   Temp: 97.6 F (36.4 C)   97.6 F (36.4 C)  TempSrc: Oral   Oral  SpO2: 98% 97% 96% 99%  Weight:      Height:        CBC:  Recent Labs  Lab 03/06/19 2133 03/06/19 2137  WBC 6.5  --   NEUTROABS 3.9  --   HGB 14.2 14.6  HCT 42.6 43.0  MCV 87.5  --   PLT 171  --     Basic Metabolic Panel:  Recent Labs  Lab 03/06/19 2132 03/06/19 2137  NA 139 140  K 4.1 4.0  CL 102 100  CO2 30  --   GLUCOSE 174* 168*  BUN 13 14  CREATININE 0.90 1.00  CALCIUM 9.0  --    Lipid Panel:     Component Value Date/Time   CHOL 182 09/05/2017 1014   TRIG 109.0 09/05/2017 1014   HDL 53.80 09/05/2017 1014   CHOLHDL 3 09/05/2017 1014   VLDL 21.8 09/05/2017 1014   LDLCALC 107 (H) 09/05/2017 1014   HgbA1c:  Lab Results  Component Value Date   HGBA1C 6.2 02/22/2018   Urine Drug Screen:     Component Value Date/Time   LABOPIA NONE DETECTED 03/06/2019 2326   COCAINSCRNUR NONE DETECTED 03/06/2019 2326   LABBENZ POSITIVE (A) 03/06/2019 2326   AMPHETMU NONE DETECTED 03/06/2019 2326   THCU NONE DETECTED 03/06/2019 2326   LABBARB NONE  DETECTED 03/06/2019 2326    Alcohol Level     Component Value Date/Time   [facility] <10 03/06/2019 2132   IMAGING Mr Angio Head Wo Contrast  Result Date: 03/07/2019 CLINICAL DATA:  Initial evaluation for acute speech difficulty, stroke-like symptoms. EXAM: MRI HEAD WITHOUT CONTRAST MRA HEAD WITHOUT CONTRAST MRA NECK WITHOUT CONTRAST TECHNIQUE: Multiplanar, multiecho pulse sequences of the brain and surrounding structures were obtained without intravenous contrast. Angiographic images of the Circle of Willis were obtained using MRA technique without intravenous contrast. Angiographic images of the neck were obtained using MRA technique without intravenous contrast. Carotid stenosis measurements (when applicable) are obtained utilizing NASCET criteria, using the distal internal carotid diameter as the denominator. COMPARISON:  Prior head CT from 03/06/2019. FINDINGS: MRI HEAD FINDINGS Brain: Generalized age-related cerebral atrophy. Patchy and confluent T2/FLAIR hyperintensity within the periventricular and deep white matter both cerebral hemispheres most consistent with chronic small vessel ischemic disease, mild in nature. Several scattered superimposed remote lacunar infarcts seen involving the right basal ganglia and hemispheric cerebral white matter. Chronic superior right cerebellar infarct noted. Patchy small volume restricted diffusion seen involving the cortical and subcortical right frontal operculum,  consistent with acute right MCA territory infarct. No associated hemorrhage or mass effect. No other evidence for acute or subacute ischemia. No acute intracranial hemorrhage. No mass lesion, midline shift or mass effect. No hydrocephalus. No extra-axial fluid collection. Pituitary gland suprasellar region normal. Midline structures intact. Vascular: Focus of susceptibility artifact seen at the inferior right sylvian fissure, suspected to reflect thrombus in a right M2 branch, seen on corresponding MRA  (series 18, image 28). Major intracranial vascular flow voids are otherwise maintained. Skull and upper cervical spine: Craniocervical junction within normal limits. Postsurgical changes partially visualize within the upper cervical spine. Bone marrow signal intensity normal. No scalp soft tissue abnormality. Sinuses/Orbits: Patient status post bilateral ocular lens replacement. Paranasal sinuses are clear. No significant mastoid effusion. Inner ear structures normal. Other: None. MRA HEAD FINDINGS ANTERIOR CIRCULATION: Distal cervical segments of the internal carotid arteries are patent with symmetric antegrade flow. Petrous segments patent bilaterally. Scattered atheromatous irregularity within the cavernous/supraclinoid ICAs without high-grade stenosis, slightly worse on the right. A1 segments patent bilaterally. Normal anterior communicating artery. Anterior cerebral arteries widely patent to their distal aspects without stenosis. Right M1 widely patent. Normal right MCA bifurcation. There is focal occlusion of a proximal right M2 branch at the base of the sylvian fissure, corresponding with susceptibility artifacts seen on corresponding MRI (series 7, image 110). Right MCA branches otherwise perfused. Left M1 patent. Normal left MCA bifurcation. Distal left MCA branches well perfused. POSTERIOR CIRCULATION: Mild atheromatous irregularity within the vertebral arteries bilaterally without flow-limiting stenosis. Left vertebral artery dominant. Posterior inferior cerebral arteries patent bilaterally. Basilar patent to its distal aspect without flow-limiting stenosis. Superior cerebral arteries patent bilaterally. Hypoplastic P1 segments with robust bilateral posterior communicating arteries. Scattered atheromatous irregularity throughout the PCAs without high-grade flow-limiting stenosis. Distal small vessel atheromatous irregularity seen throughout the intracranial circulation. No intracranial aneurysm. MRA NECK  FINDINGS AORTIC ARCH: Visualized aortic arch of normal caliber with normal 3 vessel morphology. No hemodynamically significant stenosis seen about the origin of the great vessels. Visualized subclavian arteries widely patent. RIGHT CAROTID SYSTEM: Right common carotid artery widely patent from its origin to the bifurcation. Mild atheromatous irregularity about the right bifurcation and proximal right ICA without hemodynamically significant stenosis. Right ICA widely patent distally to the skull base without stenosis or occlusion. LEFT CAROTID SYSTEM: Left common carotid artery widely patent from its origin to the bifurcation without stenosis. Mild atheromatous irregularity about the left bifurcation/proximal left ICA without hemodynamically significant stenosis. Left ICA widely patent distally to the skull base without stenosis or occlusion. VERTEBRAL ARTERIES: Both vertebral arteries arise from the subclavian arteries. Vertebral arteries not well assessed proximally. Left vertebral artery dominant. Visualized vertebral arteries widely patent within the neck without stenosis or occlusion. IMPRESSION: MRI HEAD IMPRESSION: 1. Patchy small volume acute ischemic nonhemorrhagic right MCA territory infarct involving the right frontal operculum. 2. Underlying moderate chronic microvascular ischemic disease with scattered remote lacunar infarcts within the right basal ganglia and hemispheric cerebral white matter. 3. Superimposed chronic superior right cerebellar infarct. MRA HEAD IMPRESSION: 1. Acute right M2 occlusion, consistent with the right MCA territory infarct. 2. Moderate atherosclerotic change elsewhere throughout the intracranial circulation. No other hemodynamically significant or correctable stenosis. MRA NECK IMPRESSION: 1. Mild atheromatous irregularity about the carotid bifurcations/proximal ICAs bilaterally without hemodynamically significant or critical flow limiting stenosis. Both carotid artery systems  otherwise widely patent within the neck. 2. Wide patency of the vertebral arteries within the neck. Left vertebral artery dominant Electronically Signed   By:  Jeannine Boga M.D.   On: 03/07/2019 05:24   Mr Angio Neck Wo Contrast  Result Date: 03/07/2019 CLINICAL DATA:  Initial evaluation for acute speech difficulty, stroke-like symptoms. EXAM: MRI HEAD WITHOUT CONTRAST MRA HEAD WITHOUT CONTRAST MRA NECK WITHOUT CONTRAST TECHNIQUE: Multiplanar, multiecho pulse sequences of the brain and surrounding structures were obtained without intravenous contrast. Angiographic images of the Circle of Willis were obtained using MRA technique without intravenous contrast. Angiographic images of the neck were obtained using MRA technique without intravenous contrast. Carotid stenosis measurements (when applicable) are obtained utilizing NASCET criteria, using the distal internal carotid diameter as the denominator. COMPARISON:  Prior head CT from 03/06/2019. FINDINGS: MRI HEAD FINDINGS Brain: Generalized age-related cerebral atrophy. Patchy and confluent T2/FLAIR hyperintensity within the periventricular and deep white matter both cerebral hemispheres most consistent with chronic small vessel ischemic disease, mild in nature. Several scattered superimposed remote lacunar infarcts seen involving the right basal ganglia and hemispheric cerebral white matter. Chronic superior right cerebellar infarct noted. Patchy small volume restricted diffusion seen involving the cortical and subcortical right frontal operculum, consistent with acute right MCA territory infarct. No associated hemorrhage or mass effect. No other evidence for acute or subacute ischemia. No acute intracranial hemorrhage. No mass lesion, midline shift or mass effect. No hydrocephalus. No extra-axial fluid collection. Pituitary gland suprasellar region normal. Midline structures intact. Vascular: Focus of susceptibility artifact seen at the inferior right  sylvian fissure, suspected to reflect thrombus in a right M2 branch, seen on corresponding MRA (series 18, image 28). Major intracranial vascular flow voids are otherwise maintained. Skull and upper cervical spine: Craniocervical junction within normal limits. Postsurgical changes partially visualize within the upper cervical spine. Bone marrow signal intensity normal. No scalp soft tissue abnormality. Sinuses/Orbits: Patient status post bilateral ocular lens replacement. Paranasal sinuses are clear. No significant mastoid effusion. Inner ear structures normal. Other: None. MRA HEAD FINDINGS ANTERIOR CIRCULATION: Distal cervical segments of the internal carotid arteries are patent with symmetric antegrade flow. Petrous segments patent bilaterally. Scattered atheromatous irregularity within the cavernous/supraclinoid ICAs without high-grade stenosis, slightly worse on the right. A1 segments patent bilaterally. Normal anterior communicating artery. Anterior cerebral arteries widely patent to their distal aspects without stenosis. Right M1 widely patent. Normal right MCA bifurcation. There is focal occlusion of a proximal right M2 branch at the base of the sylvian fissure, corresponding with susceptibility artifacts seen on corresponding MRI (series 7, image 110). Right MCA branches otherwise perfused. Left M1 patent. Normal left MCA bifurcation. Distal left MCA branches well perfused. POSTERIOR CIRCULATION: Mild atheromatous irregularity within the vertebral arteries bilaterally without flow-limiting stenosis. Left vertebral artery dominant. Posterior inferior cerebral arteries patent bilaterally. Basilar patent to its distal aspect without flow-limiting stenosis. Superior cerebral arteries patent bilaterally. Hypoplastic P1 segments with robust bilateral posterior communicating arteries. Scattered atheromatous irregularity throughout the PCAs without high-grade flow-limiting stenosis. Distal small vessel atheromatous  irregularity seen throughout the intracranial circulation. No intracranial aneurysm. MRA NECK FINDINGS AORTIC ARCH: Visualized aortic arch of normal caliber with normal 3 vessel morphology. No hemodynamically significant stenosis seen about the origin of the great vessels. Visualized subclavian arteries widely patent. RIGHT CAROTID SYSTEM: Right common carotid artery widely patent from its origin to the bifurcation. Mild atheromatous irregularity about the right bifurcation and proximal right ICA without hemodynamically significant stenosis. Right ICA widely patent distally to the skull base without stenosis or occlusion. LEFT CAROTID SYSTEM: Left common carotid artery widely patent from its origin to the bifurcation without stenosis. Mild atheromatous irregularity  about the left bifurcation/proximal left ICA without hemodynamically significant stenosis. Left ICA widely patent distally to the skull base without stenosis or occlusion. VERTEBRAL ARTERIES: Both vertebral arteries arise from the subclavian arteries. Vertebral arteries not well assessed proximally. Left vertebral artery dominant. Visualized vertebral arteries widely patent within the neck without stenosis or occlusion. IMPRESSION: MRI HEAD IMPRESSION: 1. Patchy small volume acute ischemic nonhemorrhagic right MCA territory infarct involving the right frontal operculum. 2. Underlying moderate chronic microvascular ischemic disease with scattered remote lacunar infarcts within the right basal ganglia and hemispheric cerebral white matter. 3. Superimposed chronic superior right cerebellar infarct. MRA HEAD IMPRESSION: 1. Acute right M2 occlusion, consistent with the right MCA territory infarct. 2. Moderate atherosclerotic change elsewhere throughout the intracranial circulation. No other hemodynamically significant or correctable stenosis. MRA NECK IMPRESSION: 1. Mild atheromatous irregularity about the carotid bifurcations/proximal ICAs bilaterally without  hemodynamically significant or critical flow limiting stenosis. Both carotid artery systems otherwise widely patent within the neck. 2. Wide patency of the vertebral arteries within the neck. Left vertebral artery dominant Electronically Signed   By: Jeannine Boga M.D.   On: 03/07/2019 05:24   Mr Brain Wo Contrast  Result Date: 03/07/2019 CLINICAL DATA:  Initial evaluation for acute speech difficulty, stroke-like symptoms. EXAM: MRI HEAD WITHOUT CONTRAST MRA HEAD WITHOUT CONTRAST MRA NECK WITHOUT CONTRAST TECHNIQUE: Multiplanar, multiecho pulse sequences of the brain and surrounding structures were obtained without intravenous contrast. Angiographic images of the Circle of Willis were obtained using MRA technique without intravenous contrast. Angiographic images of the neck were obtained using MRA technique without intravenous contrast. Carotid stenosis measurements (when applicable) are obtained utilizing NASCET criteria, using the distal internal carotid diameter as the denominator. COMPARISON:  Prior head CT from 03/06/2019. FINDINGS: MRI HEAD FINDINGS Brain: Generalized age-related cerebral atrophy. Patchy and confluent T2/FLAIR hyperintensity within the periventricular and deep white matter both cerebral hemispheres most consistent with chronic small vessel ischemic disease, mild in nature. Several scattered superimposed remote lacunar infarcts seen involving the right basal ganglia and hemispheric cerebral white matter. Chronic superior right cerebellar infarct noted. Patchy small volume restricted diffusion seen involving the cortical and subcortical right frontal operculum, consistent with acute right MCA territory infarct. No associated hemorrhage or mass effect. No other evidence for acute or subacute ischemia. No acute intracranial hemorrhage. No mass lesion, midline shift or mass effect. No hydrocephalus. No extra-axial fluid collection. Pituitary gland suprasellar region normal. Midline  structures intact. Vascular: Focus of susceptibility artifact seen at the inferior right sylvian fissure, suspected to reflect thrombus in a right M2 branch, seen on corresponding MRA (series 18, image 28). Major intracranial vascular flow voids are otherwise maintained. Skull and upper cervical spine: Craniocervical junction within normal limits. Postsurgical changes partially visualize within the upper cervical spine. Bone marrow signal intensity normal. No scalp soft tissue abnormality. Sinuses/Orbits: Patient status post bilateral ocular lens replacement. Paranasal sinuses are clear. No significant mastoid effusion. Inner ear structures normal. Other: None. MRA HEAD FINDINGS ANTERIOR CIRCULATION: Distal cervical segments of the internal carotid arteries are patent with symmetric antegrade flow. Petrous segments patent bilaterally. Scattered atheromatous irregularity within the cavernous/supraclinoid ICAs without high-grade stenosis, slightly worse on the right. A1 segments patent bilaterally. Normal anterior communicating artery. Anterior cerebral arteries widely patent to their distal aspects without stenosis. Right M1 widely patent. Normal right MCA bifurcation. There is focal occlusion of a proximal right M2 branch at the base of the sylvian fissure, corresponding with susceptibility artifacts seen on corresponding MRI (series 7, image  110). Right MCA branches otherwise perfused. Left M1 patent. Normal left MCA bifurcation. Distal left MCA branches well perfused. POSTERIOR CIRCULATION: Mild atheromatous irregularity within the vertebral arteries bilaterally without flow-limiting stenosis. Left vertebral artery dominant. Posterior inferior cerebral arteries patent bilaterally. Basilar patent to its distal aspect without flow-limiting stenosis. Superior cerebral arteries patent bilaterally. Hypoplastic P1 segments with robust bilateral posterior communicating arteries. Scattered atheromatous irregularity  throughout the PCAs without high-grade flow-limiting stenosis. Distal small vessel atheromatous irregularity seen throughout the intracranial circulation. No intracranial aneurysm. MRA NECK FINDINGS AORTIC ARCH: Visualized aortic arch of normal caliber with normal 3 vessel morphology. No hemodynamically significant stenosis seen about the origin of the great vessels. Visualized subclavian arteries widely patent. RIGHT CAROTID SYSTEM: Right common carotid artery widely patent from its origin to the bifurcation. Mild atheromatous irregularity about the right bifurcation and proximal right ICA without hemodynamically significant stenosis. Right ICA widely patent distally to the skull base without stenosis or occlusion. LEFT CAROTID SYSTEM: Left common carotid artery widely patent from its origin to the bifurcation without stenosis. Mild atheromatous irregularity about the left bifurcation/proximal left ICA without hemodynamically significant stenosis. Left ICA widely patent distally to the skull base without stenosis or occlusion. VERTEBRAL ARTERIES: Both vertebral arteries arise from the subclavian arteries. Vertebral arteries not well assessed proximally. Left vertebral artery dominant. Visualized vertebral arteries widely patent within the neck without stenosis or occlusion. IMPRESSION: MRI HEAD IMPRESSION: 1. Patchy small volume acute ischemic nonhemorrhagic right MCA territory infarct involving the right frontal operculum. 2. Underlying moderate chronic microvascular ischemic disease with scattered remote lacunar infarcts within the right basal ganglia and hemispheric cerebral white matter. 3. Superimposed chronic superior right cerebellar infarct. MRA HEAD IMPRESSION: 1. Acute right M2 occlusion, consistent with the right MCA territory infarct. 2. Moderate atherosclerotic change elsewhere throughout the intracranial circulation. No other hemodynamically significant or correctable stenosis. MRA NECK IMPRESSION: 1.  Mild atheromatous irregularity about the carotid bifurcations/proximal ICAs bilaterally without hemodynamically significant or critical flow limiting stenosis. Both carotid artery systems otherwise widely patent within the neck. 2. Wide patency of the vertebral arteries within the neck. Left vertebral artery dominant Electronically Signed   By: Jeannine Boga M.D.   On: 03/07/2019 05:24   Ct Head Code Stroke Wo Contrast  Result Date: 03/06/2019 CLINICAL DATA:  Code stroke. Slurred speech. Left-sided facial droop. Symptoms began 1900 hours. Headache. EXAM: CT HEAD WITHOUT CONTRAST TECHNIQUE: Contiguous axial images were obtained from the base of the skull through the vertex without intravenous contrast. COMPARISON:  MRI 12/20/2017.  CT 12/19/2017. FINDINGS: Brain: Old infarctions affect the right cerebellum. Cerebral hemispheres show early cortical low-density in the right frontal operculum. There is old infarction in the radiating white matter tracts on the right. No mass lesion, hemorrhage, hydrocephalus or extra-axial collection. Vascular: There is atherosclerotic calcification of the major vessels at the base of the brain. Skull: Normal Sinuses/Orbits: Clear/normal Other: None ASPECTS ([place] Stroke Program Early CT Score) - Ganglionic level infarction (caudate, lentiform nuclei, internal capsule, insula, M1-M3 cortex): 6 - Supraganglionic infarction (M4-M6 cortex): 3 Total score (0-10 with 10 being normal): 9 IMPRESSION: 1. Question loss of gray-white differentiation in the right frontal operculum, M1 sector. No evidence of hemorrhage or mass effect. Old infarctions in the right cerebellum and right radiating white matter tracts. 2. ASPECTS is 9 3. These results were called by telephone at the time of interpretation on 03/06/2019 at 9:44 pm to Dr. Aletta Edouard , who verbally acknowledged these results. Electronically Signed  By: Nelson Chimes M.D.   On: 03/06/2019 21:47   PHYSICAL EXAM   GEN: frail  cachectic looking elderly caucasian lady , pleasant, cooperative CVS: RRR, no carotid bruit CHEST: No signs of resp distress, on room air ABD: Soft, NTTP  NEUROLOGICAL EXAM:   Awake alert oriented to time and place.  Dysarthric but can be easily understood.  Follows simple midline and one-step commands.  Diminished attention, registration and recall.  Extraocular movements are full range without nystagmus.  Right gaze preference but able to look to the left past midline.  Blinks to threat on both sides but right greater than left.  Fundi not visualized.  Mild left lower facial weakness.  Tongue midline.  Motor system exam no upper or lower extremity drift but mild weakness of left grip and intrinsic hand muscles.  Fine finger movements are diminished on the left.  Orbits right over left upper extremity.  Mild weakness in left hip flexors and ankle dorsiflexors.  Sensation appears intact.  Coordination slightly impaired on the left.  Gait not tested  ASSESSMENT/PLAN Ms. [name] is a 78 y.o. female with history of Afib on home coumadin, CAD, CHF, PE, prior CVA, aortic stenosis, HTN who was admitted for Acute ischemic R M1 territory infarct.  Acute ischemic right MCA territory branch infarct secondary to acute R M2 branch occlusion from atrial fibrillation  CT head loss of fray-white differentiation in R frontal operculum  ASPECTS 9.   MRI- Patchy small volume acute ischemic nonhemorrhagic right MCA  territory infarct involving the right frontal operculum.  MRA- Acute right M2 occlusion, consistent with the right MCA territory infarct.  Echo pending  LDL 107, initiate lipitor 40 mg QD when no longer NPO  HgbA1c 6.2  IV heparin to coumadin bridge for Afib and as VTE ppx  Therapy recommendations:  PT/OT/ST  Disposition:  Pending work up  Stroke labs pending    Other Stroke Risk Factors  Advanced age  Cigarette smoker- advise to stop smoking  Hx stroke/TIA  Coronary  artery disease  Congestive heart failure  Hospital day # 0 I have personally obtained history,examined this patient, reviewed notes, independently viewed imaging studies, participated in medical decision making and plan of care.ROS completed by me personally and pertinent positives fully documented  I have made any additions or clarifications directly to the above note.  Patient has presented with speech difficulties and facial droop secondary to right MCA branch embolic infarct from atrial fibrillation and remains at risk for recurrent stroke and neurological worsening.  Recommend IV heparin bridge till Coumadin and INR is therapeutic.  Speech therapy for swallow eval.  Physical occupational therapy consults.  Continue ongoing stroke work-up.  Greater than 50% time during this 35-minute visit was spent on counseling and coordination of care about her embolic stroke and discussion about atrial fibrillation and stroke prevention and treatment  Antony Contras, MD Medical Director [place] Pager: [phone] 03/07/2019 3:53 PM  To contact Stroke Continuity provider, please refer to [email or web address] After hours, contact General Neurology

## 2019-03-07 NOTE — Evaluation (Signed)
Clinical/Bedside Swallow Evaluation Patient Details  Name: [name] MRN: [id number] Date of Birth: April 05, 1941  Today's Date: 03/07/2019 Time: SLP Start Time (ACUTE ONLY): 1253 SLP Stop Time (ACUTE ONLY): 1302 SLP Time Calculation (min) (ACUTE ONLY): 9 min  Past Medical History:  Past Medical History:  Diagnosis Date  . Anemia 2005   Generally microcytic, transfusions in [address], 2012, 02/2015, 05/2015  . Aortic stenosis    Moderate November 2017  . Arthritis   . Broken back 2013   Chronic back pain.   [name] CAD (coronary artery disease)    Cardiac catheterization 2014 - 80% mid RCA and 70% OM managed medically  . CHF (congestive heart failure) ([place])   . Chronic bilateral pleural effusions   . Chronic headache   . Contrast media allergy   . Diastolic heart failure ([place])   . Esophageal cancer ([place]) 05/06/2015   Adenocarcinoma GE junction  . Facial paresthesia   . GERD (gastroesophageal reflux disease)   . H. pylori infection   . H/O iron deficiency    05-06-15 iron infusion ([facility])  . HH (hiatus hernia) 2008   Large with associated erosions  . Hypertension   . Paroxysmal atrial fibrillation ([name])   . PONV (postoperative nausea and vomiting)   . Pulmonary emboli ([place]) 2008, 2012  . Stroke [facility]) 1982   Past Surgical History:  Past Surgical History:  Procedure Laterality Date  . APPENDECTOMY  1950s  . BACK SURGERY    . CARPAL TUNNEL RELEASE Bilateral 1990s  . [place] SURGERY  2014  . CHOLECYSTECTOMY  1980s   open  . COLONOSCOPY N/A 02/17/2015   Procedure: COLONOSCOPY;  Surgeon: [name], MD;  Location: [facility] ENDOSCOPY;  Service: Endoscopy;  Laterality: N/A;  . ESOPHAGOGASTRODUODENOSCOPY N/A 02/16/2015   Procedure: ESOPHAGOGASTRODUODENOSCOPY (EGD);  Surgeon: [name], MD;  Location: [facility];  Service: Endoscopy;  Laterality: N/A;  . ESOPHAGOGASTRODUODENOSCOPY N/A 05/06/2015   Procedure: ESOPHAGOGASTRODUODENOSCOPY (EGD);  Surgeon: [name], MD;   Location: [facility] ENDOSCOPY;  Service: Endoscopy;  Laterality: N/A;  . EUS N/A 05/20/2015   Procedure: UPPER ENDOSCOPIC ULTRASOUND (EUS) LINEAR;  Surgeon: [name], MD;  Location: [facility] ENDOSCOPY;  Service: Endoscopy;  Laterality: N/A;  . exploratory lab  1950s or 1960s  . GIVENS CAPSULE STUDY N/A 05/06/2015   Procedure: GIVENS CAPSULE STUDY;  Surgeon: [name], MD;  Location: [facility] ENDOSCOPY;  Service: Endoscopy;  Laterality: N/A;  . KNEE ARTHROSCOPY WITH MEDIAL MENISECTOMY Left 04/18/2018   Procedure: LEFT KNEE ARTHROSCOPY WITH PARTIAL MEDIAL MENISECTOMY AND ANTERIOR CRUCIATE LIGAMENT DEBRIDEMENT;  Surgeon: [name], MD;  Location: [facility] ORS;  Service: Orthopedics;  Laterality: Left;  . LEFT HEART CATH AND CORONARY ANGIOGRAPHY N/A 11/08/2016   Procedure: Left Heart Cath and Coronary Angiography;  Surgeon: [name], MD;  Location: [facility] CV LAB;  Service: Cardiovascular;  Laterality: N/A;  . lumbar back surgery  2012  . [place] SURGERY  1991  . TONSILLECTOMY    . TONSILLECTOMY AND ADENOIDECTOMY  1960s   HPI:  [name] is a 78 y.o. female with past medical history significant for chronic congestive heart failure, coronary artery disease, paroxysmal atrial fibrillation on Coumadin, PE, prior stroke, moderate aortic stenosis, hypertension presented to [facility] emergency department for sudden onset slurred speech as well as left facial droop. MRI reveals acute right M2 occlusion, consistent with the right MCA territory infarct and moderate atherosclerotic change elsewhere throughout the intracranial circulation.  Assessment / Plan / Recommendation Clinical Impression  Pt presents with severe oropharyngeal dysphagia c/b moderate to severe oral impairments and severe coughing with trials of ice chips, thin water and puree. Pt's minimal labial and lingual movements results in severely  poor oral manipulation of all boluses with anterior spillage throughout. At  rest, pt with anterior spillage of salvia. Once pt appears to have bolus in posterior oral cavity (when tilting head back), swallow initiation appears to take great effort. Pt coughed throughout consumption with severe coughing on one bolus of puree. Session terminated at that point. Suspect that pt has deficits in oral phase that results in ineffective airway protection when consuming PO intake. Recommend pt remain NPO with instrumental study likely required.  SLP Visit Diagnosis: Dysphagia, oropharyngeal phase (R13.12)    Aspiration Risk  Severe aspiration risk;Risk for inadequate nutrition/hydration    Diet Recommendation NPO   Medication Administration: Via alternative means    Other  Recommendations Oral Care Recommendations: Oral care QID Other Recommendations: Remove water pitcher;Have oral suction available   Follow up Recommendations (TBD)      Frequency and Duration min 2x/week  2 weeks       Prognosis Prognosis for Safe Diet Advancement: Good      Swallow Study   General Date of Onset: 03/06/19 HPI: [name] is a 78 y.o. female with past medical history significant for chronic congestive heart failure, coronary artery disease, paroxysmal atrial fibrillation on Coumadin, PE, prior stroke, moderate aortic stenosis, hypertension presented to [facility] emergency department for sudden onset slurred speech as well as left facial droop. MRI reveals acute right M2 occlusion, consistent with the right MCA territory infarct and moderate atherosclerotic change elsewhere throughout the intracranial circulation.  Type of Study: Bedside Swallow Evaluation Previous Swallow Assessment: none in chart Diet Prior to this Study: NPO Temperature Spikes Noted: No Respiratory Status: Room air History of Recent Intubation: No Behavior/Cognition: Alert;Cooperative;Pleasant mood Oral Cavity Assessment: Within Functional Limits Oral Care Completed by SLP: Recent completion by  staff Oral Cavity - Dentition: Adequate natural dentition Vision: Functional for self-feeding Self-Feeding Abilities: Able to feed self Patient Positioning: Upright in bed Baseline Vocal Quality: Low vocal intensity Volitional Cough: Strong Volitional Swallow: Able to elicit    Oral/Motor/Sensory Function Overall Oral Motor/Sensory Function: Moderate impairment Facial ROM: Reduced left Facial Symmetry: Abnormal symmetry left Facial Strength: Reduced left Facial Sensation: Reduced left Lingual ROM: Reduced left Lingual Symmetry: Abnormal symmetry left;Suspected CN XII (hypoglossal) dysfunction Lingual Strength: Reduced Lingual Sensation: Reduced;Suspected CN VII (facial) dysfunction-anterior 2/3 tongue Mandible: Impaired   Ice Chips Ice chips: Impaired Presentation: Spoon Oral Phase Impairments: Reduced lingual movement/coordination;Reduced labial seal;Impaired mastication;Poor awareness of bolus Oral Phase Functional Implications: Right anterior spillage;Left anterior spillage;Prolonged oral transit;Oral residue;Oral holding Pharyngeal Phase Impairments: Suspected delayed Swallow;Wet Vocal Quality   Thin Liquid Thin Liquid: Impaired Presentation: Cup Oral Phase Impairments: Reduced labial seal;Reduced lingual movement/coordination;Impaired mastication;Poor awareness of bolus Oral Phase Functional Implications: Right anterior spillage;Left anterior spillage;Prolonged oral transit Pharyngeal  Phase Impairments: Suspected delayed Swallow;Wet Vocal Quality;Cough - Immediate    Nectar Thick Nectar Thick Liquid: Not tested   Honey Thick Honey Thick Liquid: Not tested   Puree Puree: Impaired Presentation: Spoon Oral Phase Impairments: Reduced labial seal;Reduced lingual movement/coordination;Impaired mastication Oral Phase Functional Implications: Right anterior spillage;Left anterior spillage;Prolonged oral transit;Oral holding Pharyngeal Phase Impairments: Wet Vocal Quality;Cough -  Immediate;Suspected delayed Swallow   Solid     Solid: Not tested      Tiffiney Sparrow  Evy Lutterman 03/07/2019,3:35 PM

## 2019-03-07 NOTE — Progress Notes (Signed)
ANTICOAGULATION CONSULT NOTE - Initial Consult  Pharmacy Consult for heparin Indication: atrial fibrillation and stroke  Allergies  Allergen Reactions  . Iodinated Diagnostic Agents Anaphylaxis and Other (See Comments)    IPD dye Info given by patient  . Ioxaglate Anaphylaxis and Other (See Comments)    Info given by patient  . Red Dye Anaphylaxis  . Lactose Intolerance ([place])   . Whey Other (See Comments)    Lactose intolerance  . Darvon [Propoxyphene] Rash  . Hydralazine Anxiety and Other (See Comments)    Facial flushing, pt prefers not to use it.   . Milk-Related Compounds Other (See Comments)    Lactose intolerance Can tolerate milk if its cooked into the recipe, just can't drink milk     Patient Measurements: Height: 5\' 2"  (157.5 cm) Weight: 115 lb (52.2 kg) IBW/kg (Calculated) : 50.1 Heparin Dosing Weight: 52.2kg  Vital Signs: Temp: 97.6 F (36.4 C) (09/04 0131) Temp Source: Oral (09/04 0131) BP: 165/72 (09/04 0744) Pulse Rate: 82 (09/04 0744)  Labs: Recent Labs    03/06/19 2132 03/06/19 2133 03/06/19 2137  HGB  --  14.2 14.6  HCT  --  42.6 43.0  PLT  --  171  --   APTT 35  --   --   LABPROT 18.5*  --   --   INR 1.6*  --   --   CREATININE 0.90  --  1.00    Estimated Creatinine Clearance: 36.1 mL/min (by C-G formula based on SCr of 1 mg/dL).   Medical History: Past Medical History:  Diagnosis Date  . Anemia 2005   Generally microcytic, transfusions in [address], 2012, 02/2015, 05/2015  . Aortic stenosis    Moderate November 2017  . Arthritis   . Broken back 2013   Chronic back pain.   [name] CAD (coronary artery disease)    Cardiac catheterization 2014 - 80% mid RCA and 70% OM managed medically  . CHF (congestive heart failure) ([place])   . Chronic bilateral pleural effusions   . Chronic headache   . Contrast media allergy   . Diastolic heart failure ([place])   . Esophageal cancer ([place]) 05/06/2015   Adenocarcinoma GE junction  . Facial paresthesia   .  GERD (gastroesophageal reflux disease)   . H. pylori infection   . H/O iron deficiency    05-06-15 iron infusion ([facility])  . HH (hiatus hernia) 2008   Large with associated erosions  . Hypertension   . Paroxysmal atrial fibrillation ([name])   . PONV (postoperative nausea and vomiting)   . Pulmonary emboli ([place]) 2008, 2012  . Stroke [facility]) 1982    Medications:  Infusions:  . heparin      Assessment: 18 yof presented to the ED with slurred speech. Found to have an acute stroke. She is on chronic warfarin but INR is subtherapeutic today at 1.6. CBC is WNL and no bleeding noted. To start IV heparin.   Goal of Therapy:  Heparin level 0.3-0.5 units/ml Monitor platelets by anticoagulation protocol: Yes   Plan:  Heparin gtt 650 units/hr - no bolus + lower goal with stroke protocol Check an 8 hr heparin level Daily heparin level and CBC  Cambri Plourde, Rande Lawman 03/07/2019,10:48 AM

## 2019-03-08 DIAGNOSIS — R1312 Dysphagia, oropharyngeal phase: Secondary | ICD-10-CM

## 2019-03-08 DIAGNOSIS — I63311 Cerebral infarction due to thrombosis of right middle cerebral artery: Secondary | ICD-10-CM

## 2019-03-08 DIAGNOSIS — E785 Hyperlipidemia, unspecified: Secondary | ICD-10-CM

## 2019-03-08 LAB — GLUCOSE, CAPILLARY
Glucose-Capillary: 134 mg/dL — ABNORMAL HIGH (ref 70–99)
Glucose-Capillary: 127 mg/dL — ABNORMAL HIGH (ref 70–99)

## 2019-03-08 LAB — LIPID PANEL
Cholesterol: 152 mg/dL (ref 0–200)
HDL: 45 mg/dL (ref >40)
LDL Cholesterol: 93 mg/dL (ref 0–99)
Total CHOL/HDL Ratio: 3.4 RATIO
Triglycerides: 70 mg/dL (ref <150)
VLDL: 14 mg/dL (ref 0–40)

## 2019-03-08 LAB — CBC
HCT: 37.9 % (ref 36.0–46.0)
Hemoglobin: 13.3 g/dL (ref 12.0–15.0)
MCH: 30.1 pg (ref 26.0–34.0)
MCHC: 35.1 g/dL (ref 30.0–36.0)
MCV: 85.7 fL (ref 80.0–100.0)
Platelets: 146 10*3/uL — ABNORMAL LOW (ref 150–400)
RBC: 4.42 MIL/uL (ref 3.87–5.11)
RDW: 12.5 % (ref 11.5–15.5)
WBC: 6.6 10*3/uL (ref 4.0–10.5)
nRBC: 0 % (ref 0.0–0.2)

## 2019-03-08 LAB — HEPARIN LEVEL (UNFRACTIONATED)
Heparin Unfractionated: 0.28 IU/mL — ABNORMAL LOW (ref 0.30–0.70)
Heparin Unfractionated: 0.61 IU/mL (ref 0.30–0.70)
Heparin Unfractionated: 0.24 IU/mL — ABNORMAL LOW (ref 0.30–0.70)

## 2019-03-08 LAB — HEMOGLOBIN A1C
Hgb A1c MFr Bld: 6 % — ABNORMAL HIGH (ref 4.8–5.6)
Mean Plasma Glucose: 125.5 mg/dL

## 2019-03-08 MED ORDER — DEXTROSE-NACL 5-0.45 % IV SOLN
INTRAVENOUS | Status: DC
  Administered 2019-03-08 – 2019-03-09 (×2): via INTRAVENOUS

## 2019-03-08 MED ORDER — DIPHENHYDRAMINE HCL 50 MG/ML IJ SOLN
12.5000 mg | Freq: Once | INTRAMUSCULAR | Status: AC
  Administered 2019-03-08: 12.5 mg via INTRAVENOUS
  Filled 2019-03-08 (×2): qty 1

## 2019-03-08 MED ORDER — JEVITY 1.2 CAL PO LIQD
1000.0000 mL | ORAL | Status: DC
  Filled 2019-03-08: qty 1000

## 2019-03-08 MED ORDER — SODIUM CHLORIDE 0.9 % IV SOLN
INTRAVENOUS | Status: DC | PRN
  Administered 2019-03-08: 1000 mL via INTRAVENOUS

## 2019-03-08 MED ORDER — PANTOPRAZOLE SODIUM 40 MG IV SOLR
40.0000 mg | Freq: Two times a day (BID) | INTRAVENOUS | Status: DC
  Administered 2019-03-08 – 2019-03-10 (×4): 40 mg via INTRAVENOUS
  Filled 2019-03-08 (×4): qty 40

## 2019-03-08 MED ORDER — OSMOLITE 1.2 CAL PO LIQD
1000.0000 mL | ORAL | Status: DC
  Filled 2019-03-08 (×6): qty 1000

## 2019-03-08 NOTE — Progress Notes (Signed)
Paged on call about bp parameters; nursing orders say to contact MD if systolic > [phone]; no new orders

## 2019-03-08 NOTE — Progress Notes (Signed)
PROGRESS NOTE    [name]  [id number] DOB: 02-Feb-1941 DOA: 03/06/2019 PCP: Dorothyann Peng, NP   Brief Narrative:  78 year old with history of atrial fibrillation on Coumadin, CHF, prior CVA, aortic stenosis, pulmonary embolism, essential hypertension, CAD came to the hospital with difficulty speaking.  MRI of the brain revealed right MCA territory infarct with occlusion of M2.  Neurology team was consulted.  Seen by speech and swallow therapy.   Assessment & Plan:   Principal Problem:   Stroke [facility]) Active Problems:   PAF (paroxysmal atrial fibrillation) ([name])   Long term current use of anticoagulant therapy   Chronic diastolic congestive heart failure ([name])   Mixed hyperlipidemia   Essential hypertension, benign   Subtherapeutic international normalized ratio (INR)  Acute right MCA CVA with slurred speech - Neurology team following. -Echocardiogram 03/07/2019-ejection fraction 65% with normal wall motion, moderate aortic stenosis -MRI/MRA-right MCA infarct involving right frontal operculum, right M2 occlusion -Hemoglobin A1c 6.2, LDL 107 -Currently on heparin to Coumadin bridge -PT/OT-home health - Speech swallow-severe aspiration risk, n.p.o. - Order core track  Severe dysphagia secondary to acute CVA - We will order core track to be placed, dietitian consult.  Chronic diastolic congestive heart failure, ejection fraction 65% -Appears to be euvolemic.  Essential hypertension, slightly elevated -Permissive hypertension.  Hyperlipidemia - LDL 93, needs to be on statin.  Currently n.p.o. until core track placed  Coronary artery disease - No chest pain.  History of pulmonary embolism -On Coumadin with subtherapeutic INR, on heparin drip with bridge  DVT prophylaxis: Heparin bridge to Coumadin Code Status: Full Family Communication: Spoke with patient's son [name] Disposition Plan: Maintain hospital stay for complete work-up for acute CVA and address her  dysphagia  Consultants:   Neurology  Procedures:   None  Antimicrobials:   None   Subjective: Still having slurred speech.  Asking me to eat mashed potatoes and gravy but I explained to her that she is at a severe aspiration risk therefore she will have to be n.p.o.  She is agreeable to remain n.p.o. and also have a core track placed.  Review of Systems Otherwise negative except as per HPI, including: General: Denies fever, chills, night sweats or unintended weight loss. Resp: Denies cough, wheezing, shortness of breath. Cardiac: Denies chest pain, palpitations, orthopnea, paroxysmal nocturnal dyspnea. GI: Denies abdominal pain, nausea, vomiting, diarrhea or constipation GU: Denies dysuria, frequency, hesitancy or incontinence MS: Denies muscle aches, joint pain or swelling Neuro: Denies headache, , abnormal gait Psych: Denies anxiety, depression, SI/HI/AVH Skin: Denies new rashes or lesions ID: Denies sick contacts, exotic exposures, travel  Objective: Vitals:   03/08/19 0142 03/08/19 0411 03/08/19 0412 03/08/19 0700  BP: (!) 162/74 (!) 120/101 (!) 162/59 (!) (P) 163/82  Pulse: 86 82 79 (P) 72  Resp: 18 16  (P) 17  Temp: 97.6 F (36.4 C) 98 F (36.7 C)  (P) 97.7 F (36.5 C)  TempSrc: Oral   (P) Oral  SpO2: 95% 94%  (P) 96%  Weight:      Height:        Intake/Output Summary (Last 24 hours) at 03/08/2019 1152 Last data filed at 03/08/2019 0600 Gross per 24 hour  Intake 185.59 ml  Output --  Net 185.59 ml   Filed Weights   03/06/19 2213  Weight: 52.2 kg    Examination:  General exam: Appears calm and comfortable, elderly frail Respiratory system: Clear to auscultation. Respiratory effort normal. Cardiovascular system: S1 & S2 heard, RRR. No JVD,  murmurs, rubs, gallops or clicks. No pedal edema. Gastrointestinal system: Abdomen is nondistended, soft and nontender. No organomegaly or masses felt. Normal bowel sounds heard. Central nervous system: Alert and  oriented.  Slurred speech Extremities: Symmetric 4+ x 5 power. Skin: No rashes, lesions or ulcers Psychiatry: Judgement and insight appear normal. Mood & affect appropriate.    Data Reviewed:   CBC: Recent Labs  Lab 03/06/19 2133 03/06/19 2137 03/08/19 0334  WBC 6.5  --  6.6  NEUTROABS 3.9  --   --   HGB 14.2 14.6 13.3  HCT 42.6 43.0 37.9  MCV 87.5  --  85.7  PLT 171  --  [id number]*   Basic Metabolic Panel: Recent Labs  Lab 03/06/19 2132 03/06/19 2137  NA 139 140  K 4.1 4.0  CL 102 100  CO2 30  --   GLUCOSE 174* 168*  BUN 13 14  CREATININE 0.90 1.00  CALCIUM 9.0  --    GFR: Estimated Creatinine Clearance: 36.1 mL/min (by C-G formula based on SCr of 1 mg/dL). Liver Function Tests: Recent Labs  Lab 03/06/19 2132  AST 17  ALT 15  ALKPHOS 89  BILITOT 1.0  PROT 7.2  ALBUMIN 4.0   No results for input(s): LIPASE, AMYLASE in the last 168 hours. No results for input(s): AMMONIA in the last 168 hours. Coagulation Profile: Recent Labs  Lab 03/06/19 2132  INR 1.6*   Cardiac Enzymes: No results for input(s): CKTOTAL, CKMB, CKMBINDEX, TROPONINI in the last 168 hours. BNP (last 3 results) No results for input(s): PROBNP in the last 8760 hours. HbA1C: Recent Labs    03/08/19 0334  HGBA1C 6.0*   CBG: Recent Labs  Lab 03/06/19 2131  GLUCAP 164*   Lipid Profile: Recent Labs    03/08/19 0334  CHOL 152  HDL 45  LDLCALC 93  TRIG 70  CHOLHDL 3.4   Thyroid Function Tests: No results for input(s): TSH, T4TOTAL, FREET4, T3FREE, THYROIDAB in the last 72 hours. Anemia Panel: No results for input(s): VITAMINB12, FOLATE, FERRITIN, TIBC, IRON, RETICCTPCT in the last 72 hours. Sepsis Labs: No results for input(s): PROCALCITON, LATICACIDVEN in the last 168 hours.  Recent Results (from the past 240 hour(s))  SARS Coronavirus 2 [facility] order, Performed in [facility] hospital lab) Nasopharyngeal Nasopharyngeal Swab     Status: None   Collection Time: 03/06/19   9:40 PM   Specimen: Nasopharyngeal Swab  Result Value Ref Range Status   SARS Coronavirus 2 NEGATIVE NEGATIVE Final    Comment: (NOTE) If result is NEGATIVE SARS-CoV-2 target nucleic acids are NOT DETECTED. The SARS-CoV-2 RNA is generally detectable in upper and lower  respiratory specimens during the acute phase of infection. The lowest  concentration of SARS-CoV-2 viral copies this assay can detect is 250  copies / mL. A negative result does not preclude SARS-CoV-2 infection  and should not be used as the sole basis for treatment or other  patient management decisions.  A negative result may occur with  improper specimen collection / handling, submission of specimen other  than nasopharyngeal swab, presence of viral mutation(s) within the  areas targeted by this assay, and inadequate number of viral copies  (<250 copies / mL). A negative result must be combined with clinical  observations, patient history, and epidemiological information. If result is POSITIVE SARS-CoV-2 target nucleic acids are DETECTED. The SARS-CoV-2 RNA is generally detectable in upper and lower  respiratory specimens dur ing the acute phase of infection.  Positive  results  are indicative of active infection with SARS-CoV-2.  Clinical  correlation with patient history and other diagnostic information is  necessary to determine patient infection status.  Positive results do  not rule out bacterial infection or co-infection with other viruses. If result is PRESUMPTIVE POSTIVE SARS-CoV-2 nucleic acids MAY BE PRESENT.   A presumptive positive result was obtained on the submitted specimen  and confirmed on repeat testing.  While 2019 novel coronavirus  (SARS-CoV-2) nucleic acids may be present in the submitted sample  additional confirmatory testing may be necessary for epidemiological  and / or clinical management purposes  to differentiate between  SARS-CoV-2 and other Sarbecovirus currently known to infect  humans.  If clinically indicated additional testing with an alternate test  methodology [phone]) is advised. The SARS-CoV-2 RNA is generally  detectable in upper and lower respiratory sp ecimens during the acute  phase of infection. The expected result is Negative. Fact Sheet for Patients:  StrictlyIdeas.no Fact Sheet for Healthcare Providers: BankingDealers.co.za This test is not yet approved or cleared by the [place] FDA and has been authorized for detection and/or diagnosis of SARS-CoV-2 by FDA under an Emergency Use Authorization (EUA).  This EUA will remain in effect (meaning this test can be used) for the duration of the COVID-19 declaration under Section 564(b)(1) of the Act, 21 U.S.C. section 360bbb-3(b)(1), unless the authorization is terminated or revoked sooner. Performed at [facility], [address], [place],  [address]          Radiology Studies: Mr Angio Head Wo Contrast  Result Date: 03/07/2019 CLINICAL DATA:  Initial evaluation for acute speech difficulty, stroke-like symptoms. EXAM: MRI HEAD WITHOUT CONTRAST MRA HEAD WITHOUT CONTRAST MRA NECK WITHOUT CONTRAST TECHNIQUE: Multiplanar, multiecho pulse sequences of the brain and surrounding structures were obtained without intravenous contrast. Angiographic images of the Circle of Willis were obtained using MRA technique without intravenous contrast. Angiographic images of the neck were obtained using MRA technique without intravenous contrast. Carotid stenosis measurements (when applicable) are obtained utilizing NASCET criteria, using the distal internal carotid diameter as the denominator. COMPARISON:  Prior head CT from 03/06/2019. FINDINGS: MRI HEAD FINDINGS Brain: Generalized age-related cerebral atrophy. Patchy and confluent T2/FLAIR hyperintensity within the periventricular and deep white matter both cerebral hemispheres most consistent with chronic small  vessel ischemic disease, mild in nature. Several scattered superimposed remote lacunar infarcts seen involving the right basal ganglia and hemispheric cerebral white matter. Chronic superior right cerebellar infarct noted. Patchy small volume restricted diffusion seen involving the cortical and subcortical right frontal operculum, consistent with acute right MCA territory infarct. No associated hemorrhage or mass effect. No other evidence for acute or subacute ischemia. No acute intracranial hemorrhage. No mass lesion, midline shift or mass effect. No hydrocephalus. No extra-axial fluid collection. Pituitary gland suprasellar region normal. Midline structures intact. Vascular: Focus of susceptibility artifact seen at the inferior right sylvian fissure, suspected to reflect thrombus in a right M2 branch, seen on corresponding MRA (series 18, image 28). Major intracranial vascular flow voids are otherwise maintained. Skull and upper cervical spine: Craniocervical junction within normal limits. Postsurgical changes partially visualize within the upper cervical spine. Bone marrow signal intensity normal. No scalp soft tissue abnormality. Sinuses/Orbits: Patient status post bilateral ocular lens replacement. Paranasal sinuses are clear. No significant mastoid effusion. Inner ear structures normal. Other: None. MRA HEAD FINDINGS ANTERIOR CIRCULATION: Distal cervical segments of the internal carotid arteries are patent with symmetric antegrade flow. Petrous segments patent bilaterally. Scattered atheromatous irregularity within the  cavernous/supraclinoid ICAs without high-grade stenosis, slightly worse on the right. A1 segments patent bilaterally. Normal anterior communicating artery. Anterior cerebral arteries widely patent to their distal aspects without stenosis. Right M1 widely patent. Normal right MCA bifurcation. There is focal occlusion of a proximal right M2 branch at the base of the sylvian fissure, corresponding  with susceptibility artifacts seen on corresponding MRI (series 7, image 110). Right MCA branches otherwise perfused. Left M1 patent. Normal left MCA bifurcation. Distal left MCA branches well perfused. POSTERIOR CIRCULATION: Mild atheromatous irregularity within the vertebral arteries bilaterally without flow-limiting stenosis. Left vertebral artery dominant. Posterior inferior cerebral arteries patent bilaterally. Basilar patent to its distal aspect without flow-limiting stenosis. Superior cerebral arteries patent bilaterally. Hypoplastic P1 segments with robust bilateral posterior communicating arteries. Scattered atheromatous irregularity throughout the PCAs without high-grade flow-limiting stenosis. Distal small vessel atheromatous irregularity seen throughout the intracranial circulation. No intracranial aneurysm. MRA NECK FINDINGS AORTIC ARCH: Visualized aortic arch of normal caliber with normal 3 vessel morphology. No hemodynamically significant stenosis seen about the origin of the great vessels. Visualized subclavian arteries widely patent. RIGHT CAROTID SYSTEM: Right common carotid artery widely patent from its origin to the bifurcation. Mild atheromatous irregularity about the right bifurcation and proximal right ICA without hemodynamically significant stenosis. Right ICA widely patent distally to the skull base without stenosis or occlusion. LEFT CAROTID SYSTEM: Left common carotid artery widely patent from its origin to the bifurcation without stenosis. Mild atheromatous irregularity about the left bifurcation/proximal left ICA without hemodynamically significant stenosis. Left ICA widely patent distally to the skull base without stenosis or occlusion. VERTEBRAL ARTERIES: Both vertebral arteries arise from the subclavian arteries. Vertebral arteries not well assessed proximally. Left vertebral artery dominant. Visualized vertebral arteries widely patent within the neck without stenosis or occlusion.  IMPRESSION: MRI HEAD IMPRESSION: 1. Patchy small volume acute ischemic nonhemorrhagic right MCA territory infarct involving the right frontal operculum. 2. Underlying moderate chronic microvascular ischemic disease with scattered remote lacunar infarcts within the right basal ganglia and hemispheric cerebral white matter. 3. Superimposed chronic superior right cerebellar infarct. MRA HEAD IMPRESSION: 1. Acute right M2 occlusion, consistent with the right MCA territory infarct. 2. Moderate atherosclerotic change elsewhere throughout the intracranial circulation. No other hemodynamically significant or correctable stenosis. MRA NECK IMPRESSION: 1. Mild atheromatous irregularity about the carotid bifurcations/proximal ICAs bilaterally without hemodynamically significant or critical flow limiting stenosis. Both carotid artery systems otherwise widely patent within the neck. 2. Wide patency of the vertebral arteries within the neck. Left vertebral artery dominant Electronically Signed   By: Jeannine Boga M.D.   On: 03/07/2019 05:24   Mr Angio Neck Wo Contrast  Result Date: 03/07/2019 CLINICAL DATA:  Initial evaluation for acute speech difficulty, stroke-like symptoms. EXAM: MRI HEAD WITHOUT CONTRAST MRA HEAD WITHOUT CONTRAST MRA NECK WITHOUT CONTRAST TECHNIQUE: Multiplanar, multiecho pulse sequences of the brain and surrounding structures were obtained without intravenous contrast. Angiographic images of the Circle of Willis were obtained using MRA technique without intravenous contrast. Angiographic images of the neck were obtained using MRA technique without intravenous contrast. Carotid stenosis measurements (when applicable) are obtained utilizing NASCET criteria, using the distal internal carotid diameter as the denominator. COMPARISON:  Prior head CT from 03/06/2019. FINDINGS: MRI HEAD FINDINGS Brain: Generalized age-related cerebral atrophy. Patchy and confluent T2/FLAIR hyperintensity within the  periventricular and deep white matter both cerebral hemispheres most consistent with chronic small vessel ischemic disease, mild in nature. Several scattered superimposed remote lacunar infarcts seen involving the right basal ganglia and hemispheric  cerebral white matter. Chronic superior right cerebellar infarct noted. Patchy small volume restricted diffusion seen involving the cortical and subcortical right frontal operculum, consistent with acute right MCA territory infarct. No associated hemorrhage or mass effect. No other evidence for acute or subacute ischemia. No acute intracranial hemorrhage. No mass lesion, midline shift or mass effect. No hydrocephalus. No extra-axial fluid collection. Pituitary gland suprasellar region normal. Midline structures intact. Vascular: Focus of susceptibility artifact seen at the inferior right sylvian fissure, suspected to reflect thrombus in a right M2 branch, seen on corresponding MRA (series 18, image 28). Major intracranial vascular flow voids are otherwise maintained. Skull and upper cervical spine: Craniocervical junction within normal limits. Postsurgical changes partially visualize within the upper cervical spine. Bone marrow signal intensity normal. No scalp soft tissue abnormality. Sinuses/Orbits: Patient status post bilateral ocular lens replacement. Paranasal sinuses are clear. No significant mastoid effusion. Inner ear structures normal. Other: None. MRA HEAD FINDINGS ANTERIOR CIRCULATION: Distal cervical segments of the internal carotid arteries are patent with symmetric antegrade flow. Petrous segments patent bilaterally. Scattered atheromatous irregularity within the cavernous/supraclinoid ICAs without high-grade stenosis, slightly worse on the right. A1 segments patent bilaterally. Normal anterior communicating artery. Anterior cerebral arteries widely patent to their distal aspects without stenosis. Right M1 widely patent. Normal right MCA bifurcation. There  is focal occlusion of a proximal right M2 branch at the base of the sylvian fissure, corresponding with susceptibility artifacts seen on corresponding MRI (series 7, image 110). Right MCA branches otherwise perfused. Left M1 patent. Normal left MCA bifurcation. Distal left MCA branches well perfused. POSTERIOR CIRCULATION: Mild atheromatous irregularity within the vertebral arteries bilaterally without flow-limiting stenosis. Left vertebral artery dominant. Posterior inferior cerebral arteries patent bilaterally. Basilar patent to its distal aspect without flow-limiting stenosis. Superior cerebral arteries patent bilaterally. Hypoplastic P1 segments with robust bilateral posterior communicating arteries. Scattered atheromatous irregularity throughout the PCAs without high-grade flow-limiting stenosis. Distal small vessel atheromatous irregularity seen throughout the intracranial circulation. No intracranial aneurysm. MRA NECK FINDINGS AORTIC ARCH: Visualized aortic arch of normal caliber with normal 3 vessel morphology. No hemodynamically significant stenosis seen about the origin of the great vessels. Visualized subclavian arteries widely patent. RIGHT CAROTID SYSTEM: Right common carotid artery widely patent from its origin to the bifurcation. Mild atheromatous irregularity about the right bifurcation and proximal right ICA without hemodynamically significant stenosis. Right ICA widely patent distally to the skull base without stenosis or occlusion. LEFT CAROTID SYSTEM: Left common carotid artery widely patent from its origin to the bifurcation without stenosis. Mild atheromatous irregularity about the left bifurcation/proximal left ICA without hemodynamically significant stenosis. Left ICA widely patent distally to the skull base without stenosis or occlusion. VERTEBRAL ARTERIES: Both vertebral arteries arise from the subclavian arteries. Vertebral arteries not well assessed proximally. Left vertebral artery  dominant. Visualized vertebral arteries widely patent within the neck without stenosis or occlusion. IMPRESSION: MRI HEAD IMPRESSION: 1. Patchy small volume acute ischemic nonhemorrhagic right MCA territory infarct involving the right frontal operculum. 2. Underlying moderate chronic microvascular ischemic disease with scattered remote lacunar infarcts within the right basal ganglia and hemispheric cerebral white matter. 3. Superimposed chronic superior right cerebellar infarct. MRA HEAD IMPRESSION: 1. Acute right M2 occlusion, consistent with the right MCA territory infarct. 2. Moderate atherosclerotic change elsewhere throughout the intracranial circulation. No other hemodynamically significant or correctable stenosis. MRA NECK IMPRESSION: 1. Mild atheromatous irregularity about the carotid bifurcations/proximal ICAs bilaterally without hemodynamically significant or critical flow limiting stenosis. Both carotid artery systems otherwise widely patent  within the neck. 2. Wide patency of the vertebral arteries within the neck. Left vertebral artery dominant Electronically Signed   By: Jeannine Boga M.D.   On: 03/07/2019 05:24   Mr Brain Wo Contrast  Result Date: 03/07/2019 CLINICAL DATA:  Initial evaluation for acute speech difficulty, stroke-like symptoms. EXAM: MRI HEAD WITHOUT CONTRAST MRA HEAD WITHOUT CONTRAST MRA NECK WITHOUT CONTRAST TECHNIQUE: Multiplanar, multiecho pulse sequences of the brain and surrounding structures were obtained without intravenous contrast. Angiographic images of the Circle of Willis were obtained using MRA technique without intravenous contrast. Angiographic images of the neck were obtained using MRA technique without intravenous contrast. Carotid stenosis measurements (when applicable) are obtained utilizing NASCET criteria, using the distal internal carotid diameter as the denominator. COMPARISON:  Prior head CT from 03/06/2019. FINDINGS: MRI HEAD FINDINGS Brain:  Generalized age-related cerebral atrophy. Patchy and confluent T2/FLAIR hyperintensity within the periventricular and deep white matter both cerebral hemispheres most consistent with chronic small vessel ischemic disease, mild in nature. Several scattered superimposed remote lacunar infarcts seen involving the right basal ganglia and hemispheric cerebral white matter. Chronic superior right cerebellar infarct noted. Patchy small volume restricted diffusion seen involving the cortical and subcortical right frontal operculum, consistent with acute right MCA territory infarct. No associated hemorrhage or mass effect. No other evidence for acute or subacute ischemia. No acute intracranial hemorrhage. No mass lesion, midline shift or mass effect. No hydrocephalus. No extra-axial fluid collection. Pituitary gland suprasellar region normal. Midline structures intact. Vascular: Focus of susceptibility artifact seen at the inferior right sylvian fissure, suspected to reflect thrombus in a right M2 branch, seen on corresponding MRA (series 18, image 28). Major intracranial vascular flow voids are otherwise maintained. Skull and upper cervical spine: Craniocervical junction within normal limits. Postsurgical changes partially visualize within the upper cervical spine. Bone marrow signal intensity normal. No scalp soft tissue abnormality. Sinuses/Orbits: Patient status post bilateral ocular lens replacement. Paranasal sinuses are clear. No significant mastoid effusion. Inner ear structures normal. Other: None. MRA HEAD FINDINGS ANTERIOR CIRCULATION: Distal cervical segments of the internal carotid arteries are patent with symmetric antegrade flow. Petrous segments patent bilaterally. Scattered atheromatous irregularity within the cavernous/supraclinoid ICAs without high-grade stenosis, slightly worse on the right. A1 segments patent bilaterally. Normal anterior communicating artery. Anterior cerebral arteries widely patent to  their distal aspects without stenosis. Right M1 widely patent. Normal right MCA bifurcation. There is focal occlusion of a proximal right M2 branch at the base of the sylvian fissure, corresponding with susceptibility artifacts seen on corresponding MRI (series 7, image 110). Right MCA branches otherwise perfused. Left M1 patent. Normal left MCA bifurcation. Distal left MCA branches well perfused. POSTERIOR CIRCULATION: Mild atheromatous irregularity within the vertebral arteries bilaterally without flow-limiting stenosis. Left vertebral artery dominant. Posterior inferior cerebral arteries patent bilaterally. Basilar patent to its distal aspect without flow-limiting stenosis. Superior cerebral arteries patent bilaterally. Hypoplastic P1 segments with robust bilateral posterior communicating arteries. Scattered atheromatous irregularity throughout the PCAs without high-grade flow-limiting stenosis. Distal small vessel atheromatous irregularity seen throughout the intracranial circulation. No intracranial aneurysm. MRA NECK FINDINGS AORTIC ARCH: Visualized aortic arch of normal caliber with normal 3 vessel morphology. No hemodynamically significant stenosis seen about the origin of the great vessels. Visualized subclavian arteries widely patent. RIGHT CAROTID SYSTEM: Right common carotid artery widely patent from its origin to the bifurcation. Mild atheromatous irregularity about the right bifurcation and proximal right ICA without hemodynamically significant stenosis. Right ICA widely patent distally to the skull base without stenosis or  occlusion. LEFT CAROTID SYSTEM: Left common carotid artery widely patent from its origin to the bifurcation without stenosis. Mild atheromatous irregularity about the left bifurcation/proximal left ICA without hemodynamically significant stenosis. Left ICA widely patent distally to the skull base without stenosis or occlusion. VERTEBRAL ARTERIES: Both vertebral arteries arise from  the subclavian arteries. Vertebral arteries not well assessed proximally. Left vertebral artery dominant. Visualized vertebral arteries widely patent within the neck without stenosis or occlusion. IMPRESSION: MRI HEAD IMPRESSION: 1. Patchy small volume acute ischemic nonhemorrhagic right MCA territory infarct involving the right frontal operculum. 2. Underlying moderate chronic microvascular ischemic disease with scattered remote lacunar infarcts within the right basal ganglia and hemispheric cerebral white matter. 3. Superimposed chronic superior right cerebellar infarct. MRA HEAD IMPRESSION: 1. Acute right M2 occlusion, consistent with the right MCA territory infarct. 2. Moderate atherosclerotic change elsewhere throughout the intracranial circulation. No other hemodynamically significant or correctable stenosis. MRA NECK IMPRESSION: 1. Mild atheromatous irregularity about the carotid bifurcations/proximal ICAs bilaterally without hemodynamically significant or critical flow limiting stenosis. Both carotid artery systems otherwise widely patent within the neck. 2. Wide patency of the vertebral arteries within the neck. Left vertebral artery dominant Electronically Signed   By: Jeannine Boga M.D.   On: 03/07/2019 05:24   Ct Head Code Stroke Wo Contrast  Result Date: 03/06/2019 CLINICAL DATA:  Code stroke. Slurred speech. Left-sided facial droop. Symptoms began 1900 hours. Headache. EXAM: CT HEAD WITHOUT CONTRAST TECHNIQUE: Contiguous axial images were obtained from the base of the skull through the vertex without intravenous contrast. COMPARISON:  MRI 12/20/2017.  CT 12/19/2017. FINDINGS: Brain: Old infarctions affect the right cerebellum. Cerebral hemispheres show early cortical low-density in the right frontal operculum. There is old infarction in the radiating white matter tracts on the right. No mass lesion, hemorrhage, hydrocephalus or extra-axial collection. Vascular: There is atherosclerotic  calcification of the major vessels at the base of the brain. Skull: Normal Sinuses/Orbits: Clear/normal Other: None ASPECTS ([place] Stroke Program Early CT Score) - Ganglionic level infarction (caudate, lentiform nuclei, internal capsule, insula, M1-M3 cortex): 6 - Supraganglionic infarction (M4-M6 cortex): 3 Total score (0-10 with 10 being normal): 9 IMPRESSION: 1. Question loss of gray-white differentiation in the right frontal operculum, M1 sector. No evidence of hemorrhage or mass effect. Old infarctions in the right cerebellum and right radiating white matter tracts. 2. ASPECTS is 9 3. These results were called by telephone at the time of interpretation on 03/06/2019 at 9:44 pm to Dr. Aletta Edouard , who verbally acknowledged these results. Electronically Signed   By: Nelson Chimes M.D.   On: 03/06/2019 21:47        Scheduled Meds:  atorvastatin  40 mg Oral q1800   pantoprazole  40 mg Oral BID   Continuous Infusions:  sodium chloride Stopped (03/07/19 2139)   sodium chloride 10 mL/hr at 03/08/19 0600   heparin 900 Units/hr (03/08/19 0600)     LOS: 1 day   Time spent= 35 mins    Nathanael Krist Arsenio Loader, MD [facility]  If 7PM-7AM, please contact night-coverage [email or web address] 03/08/2019, 11:52 AM

## 2019-03-08 NOTE — Progress Notes (Signed)
[facility] team not available today, MD made aware. Received order to place NGT.2 nurses attempted NGT unsuccessfully.Patient nose bleeding and no further attempts to insert the NGT. Patient assisted with ice packs. MD made aware.

## 2019-03-08 NOTE — Progress Notes (Signed)
ANTICOAGULATION CONSULT NOTE - Follow Up Consult  Pharmacy Consult for heparin Indication: atrial fibrillation and stroke  Labs: Recent Labs    03/06/19 2132  03/06/19 2133 03/06/19 2137 03/07/19 1930 03/08/19 0334  HGB  --    < > 14.2 14.6  --  13.3  HCT  --   --  42.6 43.0  --  37.9  PLT  --   --  171  --   --  146*  APTT 35  --   --   --   --   --   LABPROT 18.5*  --   --   --   --   --   INR 1.6*  --   --   --   --   --   HEPARINUNFRC  --   --   --   --  0.15* 0.28*  CREATININE 0.90  --   --  1.00  --   --    < > = values in this interval not displayed.    Assessment: 78yo female subtherapeutic on heparin after rate change though closer to goal; no gtt issues or signs of bleeding per RN.  Goal of Therapy:  Heparin level 0.3-0.5 units/ml   Plan:  Will increase heparin gtt slightly to 900 units/hr and check level in 8 hours.    Wynona Neat, PharmD, BCPS  03/08/2019,4:55 AM

## 2019-03-08 NOTE — Progress Notes (Signed)
STROKE TEAM PROGRESS NOTE   INTERVAL HISTORY Pt lying in bed, lethargic, however, awake alert, orientated.  Still on heparin IV.  Did not pass swallow, currently n.p.o.  Vitals:   03/07/19 2350 03/08/19 0142 03/08/19 0411 03/08/19 0412  BP: (!) 184/81 (!) 162/74 (!) 120/101 (!) 162/59  Pulse: 92 86 82 79  Resp: 16 18 16    Temp: 98.5 F (36.9 C) 97.6 F (36.4 C) 98 F (36.7 C)   TempSrc:  Oral    SpO2: 95% 95% 94%   Weight:      Height:        CBC:  Recent Labs  Lab 03/06/19 2133 03/06/19 2137 03/08/19 0334  WBC 6.5  --  6.6  NEUTROABS 3.9  --   --   HGB 14.2 14.6 13.3  HCT 42.6 43.0 37.9  MCV 87.5  --  85.7  PLT 171  --  146*    Basic Metabolic Panel:  Recent Labs  Lab 03/06/19 2132 03/06/19 2137  NA 139 140  K 4.1 4.0  CL 102 100  CO2 30  --   GLUCOSE 174* 168*  BUN 13 14  CREATININE 0.90 1.00  CALCIUM 9.0  --    Lipid Panel:     Component Value Date/Time   CHOL 152 03/08/2019 0334   TRIG 70 03/08/2019 0334   HDL 45 03/08/2019 0334   CHOLHDL 3.4 03/08/2019 0334   VLDL 14 03/08/2019 0334   LDLCALC 93 03/08/2019 0334   HgbA1c:  Lab Results  Component Value Date   HGBA1C 6.0 (H) 03/08/2019   Urine Drug Screen:     Component Value Date/Time   LABOPIA NONE DETECTED 03/06/2019 2326   COCAINSCRNUR NONE DETECTED 03/06/2019 2326   LABBENZ POSITIVE (A) 03/06/2019 2326   AMPHETMU NONE DETECTED 03/06/2019 2326   THCU NONE DETECTED 03/06/2019 2326   LABBARB NONE DETECTED 03/06/2019 2326    Alcohol Level     Component Value Date/Time   [facility] <10 03/06/2019 2132   IMAGING Mr Angio Head Wo Contrast  Result Date: 03/07/2019 CLINICAL DATA:  Initial evaluation for acute speech difficulty, stroke-like symptoms. EXAM: MRI HEAD WITHOUT CONTRAST MRA HEAD WITHOUT CONTRAST MRA NECK WITHOUT CONTRAST TECHNIQUE: Multiplanar, multiecho pulse sequences of the brain and surrounding structures were obtained without intravenous contrast. Angiographic images of the Circle  of Willis were obtained using MRA technique without intravenous contrast. Angiographic images of the neck were obtained using MRA technique without intravenous contrast. Carotid stenosis measurements (when applicable) are obtained utilizing NASCET criteria, using the distal internal carotid diameter as the denominator. COMPARISON:  Prior head CT from 03/06/2019. FINDINGS: MRI HEAD FINDINGS Brain: Generalized age-related cerebral atrophy. Patchy and confluent T2/FLAIR hyperintensity within the periventricular and deep white matter both cerebral hemispheres most consistent with chronic small vessel ischemic disease, mild in nature. Several scattered superimposed remote lacunar infarcts seen involving the right basal ganglia and hemispheric cerebral white matter. Chronic superior right cerebellar infarct noted. Patchy small volume restricted diffusion seen involving the cortical and subcortical right frontal operculum, consistent with acute right MCA territory infarct. No associated hemorrhage or mass effect. No other evidence for acute or subacute ischemia. No acute intracranial hemorrhage. No mass lesion, midline shift or mass effect. No hydrocephalus. No extra-axial fluid collection. Pituitary gland suprasellar region normal. Midline structures intact. Vascular: Focus of susceptibility artifact seen at the inferior right sylvian fissure, suspected to reflect thrombus in a right M2 branch, seen on corresponding MRA (series 18, image 28). Major intracranial  vascular flow voids are otherwise maintained. Skull and upper cervical spine: Craniocervical junction within normal limits. Postsurgical changes partially visualize within the upper cervical spine. Bone marrow signal intensity normal. No scalp soft tissue abnormality. Sinuses/Orbits: Patient status post bilateral ocular lens replacement. Paranasal sinuses are clear. No significant mastoid effusion. Inner ear structures normal. Other: None. MRA HEAD FINDINGS  ANTERIOR CIRCULATION: Distal cervical segments of the internal carotid arteries are patent with symmetric antegrade flow. Petrous segments patent bilaterally. Scattered atheromatous irregularity within the cavernous/supraclinoid ICAs without high-grade stenosis, slightly worse on the right. A1 segments patent bilaterally. Normal anterior communicating artery. Anterior cerebral arteries widely patent to their distal aspects without stenosis. Right M1 widely patent. Normal right MCA bifurcation. There is focal occlusion of a proximal right M2 branch at the base of the sylvian fissure, corresponding with susceptibility artifacts seen on corresponding MRI (series 7, image 110). Right MCA branches otherwise perfused. Left M1 patent. Normal left MCA bifurcation. Distal left MCA branches well perfused. POSTERIOR CIRCULATION: Mild atheromatous irregularity within the vertebral arteries bilaterally without flow-limiting stenosis. Left vertebral artery dominant. Posterior inferior cerebral arteries patent bilaterally. Basilar patent to its distal aspect without flow-limiting stenosis. Superior cerebral arteries patent bilaterally. Hypoplastic P1 segments with robust bilateral posterior communicating arteries. Scattered atheromatous irregularity throughout the PCAs without high-grade flow-limiting stenosis. Distal small vessel atheromatous irregularity seen throughout the intracranial circulation. No intracranial aneurysm. MRA NECK FINDINGS AORTIC ARCH: Visualized aortic arch of normal caliber with normal 3 vessel morphology. No hemodynamically significant stenosis seen about the origin of the great vessels. Visualized subclavian arteries widely patent. RIGHT CAROTID SYSTEM: Right common carotid artery widely patent from its origin to the bifurcation. Mild atheromatous irregularity about the right bifurcation and proximal right ICA without hemodynamically significant stenosis. Right ICA widely patent distally to the skull base  without stenosis or occlusion. LEFT CAROTID SYSTEM: Left common carotid artery widely patent from its origin to the bifurcation without stenosis. Mild atheromatous irregularity about the left bifurcation/proximal left ICA without hemodynamically significant stenosis. Left ICA widely patent distally to the skull base without stenosis or occlusion. VERTEBRAL ARTERIES: Both vertebral arteries arise from the subclavian arteries. Vertebral arteries not well assessed proximally. Left vertebral artery dominant. Visualized vertebral arteries widely patent within the neck without stenosis or occlusion. IMPRESSION: MRI HEAD IMPRESSION: 1. Patchy small volume acute ischemic nonhemorrhagic right MCA territory infarct involving the right frontal operculum. 2. Underlying moderate chronic microvascular ischemic disease with scattered remote lacunar infarcts within the right basal ganglia and hemispheric cerebral white matter. 3. Superimposed chronic superior right cerebellar infarct. MRA HEAD IMPRESSION: 1. Acute right M2 occlusion, consistent with the right MCA territory infarct. 2. Moderate atherosclerotic change elsewhere throughout the intracranial circulation. No other hemodynamically significant or correctable stenosis. MRA NECK IMPRESSION: 1. Mild atheromatous irregularity about the carotid bifurcations/proximal ICAs bilaterally without hemodynamically significant or critical flow limiting stenosis. Both carotid artery systems otherwise widely patent within the neck. 2. Wide patency of the vertebral arteries within the neck. Left vertebral artery dominant Electronically Signed   By: Jeannine Boga M.D.   On: 03/07/2019 05:24   Mr Angio Neck Wo Contrast  Result Date: 03/07/2019 CLINICAL DATA:  Initial evaluation for acute speech difficulty, stroke-like symptoms. EXAM: MRI HEAD WITHOUT CONTRAST MRA HEAD WITHOUT CONTRAST MRA NECK WITHOUT CONTRAST TECHNIQUE: Multiplanar, multiecho pulse sequences of the brain and  surrounding structures were obtained without intravenous contrast. Angiographic images of the Circle of Willis were obtained using MRA technique without intravenous contrast. Angiographic images of  the neck were obtained using MRA technique without intravenous contrast. Carotid stenosis measurements (when applicable) are obtained utilizing NASCET criteria, using the distal internal carotid diameter as the denominator. COMPARISON:  Prior head CT from 03/06/2019. FINDINGS: MRI HEAD FINDINGS Brain: Generalized age-related cerebral atrophy. Patchy and confluent T2/FLAIR hyperintensity within the periventricular and deep white matter both cerebral hemispheres most consistent with chronic small vessel ischemic disease, mild in nature. Several scattered superimposed remote lacunar infarcts seen involving the right basal ganglia and hemispheric cerebral white matter. Chronic superior right cerebellar infarct noted. Patchy small volume restricted diffusion seen involving the cortical and subcortical right frontal operculum, consistent with acute right MCA territory infarct. No associated hemorrhage or mass effect. No other evidence for acute or subacute ischemia. No acute intracranial hemorrhage. No mass lesion, midline shift or mass effect. No hydrocephalus. No extra-axial fluid collection. Pituitary gland suprasellar region normal. Midline structures intact. Vascular: Focus of susceptibility artifact seen at the inferior right sylvian fissure, suspected to reflect thrombus in a right M2 branch, seen on corresponding MRA (series 18, image 28). Major intracranial vascular flow voids are otherwise maintained. Skull and upper cervical spine: Craniocervical junction within normal limits. Postsurgical changes partially visualize within the upper cervical spine. Bone marrow signal intensity normal. No scalp soft tissue abnormality. Sinuses/Orbits: Patient status post bilateral ocular lens replacement. Paranasal sinuses are clear.  No significant mastoid effusion. Inner ear structures normal. Other: None. MRA HEAD FINDINGS ANTERIOR CIRCULATION: Distal cervical segments of the internal carotid arteries are patent with symmetric antegrade flow. Petrous segments patent bilaterally. Scattered atheromatous irregularity within the cavernous/supraclinoid ICAs without high-grade stenosis, slightly worse on the right. A1 segments patent bilaterally. Normal anterior communicating artery. Anterior cerebral arteries widely patent to their distal aspects without stenosis. Right M1 widely patent. Normal right MCA bifurcation. There is focal occlusion of a proximal right M2 branch at the base of the sylvian fissure, corresponding with susceptibility artifacts seen on corresponding MRI (series 7, image 110). Right MCA branches otherwise perfused. Left M1 patent. Normal left MCA bifurcation. Distal left MCA branches well perfused. POSTERIOR CIRCULATION: Mild atheromatous irregularity within the vertebral arteries bilaterally without flow-limiting stenosis. Left vertebral artery dominant. Posterior inferior cerebral arteries patent bilaterally. Basilar patent to its distal aspect without flow-limiting stenosis. Superior cerebral arteries patent bilaterally. Hypoplastic P1 segments with robust bilateral posterior communicating arteries. Scattered atheromatous irregularity throughout the PCAs without high-grade flow-limiting stenosis. Distal small vessel atheromatous irregularity seen throughout the intracranial circulation. No intracranial aneurysm. MRA NECK FINDINGS AORTIC ARCH: Visualized aortic arch of normal caliber with normal 3 vessel morphology. No hemodynamically significant stenosis seen about the origin of the great vessels. Visualized subclavian arteries widely patent. RIGHT CAROTID SYSTEM: Right common carotid artery widely patent from its origin to the bifurcation. Mild atheromatous irregularity about the right bifurcation and proximal right ICA  without hemodynamically significant stenosis. Right ICA widely patent distally to the skull base without stenosis or occlusion. LEFT CAROTID SYSTEM: Left common carotid artery widely patent from its origin to the bifurcation without stenosis. Mild atheromatous irregularity about the left bifurcation/proximal left ICA without hemodynamically significant stenosis. Left ICA widely patent distally to the skull base without stenosis or occlusion. VERTEBRAL ARTERIES: Both vertebral arteries arise from the subclavian arteries. Vertebral arteries not well assessed proximally. Left vertebral artery dominant. Visualized vertebral arteries widely patent within the neck without stenosis or occlusion. IMPRESSION: MRI HEAD IMPRESSION: 1. Patchy small volume acute ischemic nonhemorrhagic right MCA territory infarct involving the right frontal operculum. 2. Underlying moderate  chronic microvascular ischemic disease with scattered remote lacunar infarcts within the right basal ganglia and hemispheric cerebral white matter. 3. Superimposed chronic superior right cerebellar infarct. MRA HEAD IMPRESSION: 1. Acute right M2 occlusion, consistent with the right MCA territory infarct. 2. Moderate atherosclerotic change elsewhere throughout the intracranial circulation. No other hemodynamically significant or correctable stenosis. MRA NECK IMPRESSION: 1. Mild atheromatous irregularity about the carotid bifurcations/proximal ICAs bilaterally without hemodynamically significant or critical flow limiting stenosis. Both carotid artery systems otherwise widely patent within the neck. 2. Wide patency of the vertebral arteries within the neck. Left vertebral artery dominant Electronically Signed   By: Jeannine Boga M.D.   On: 03/07/2019 05:24   Mr Brain Wo Contrast  Result Date: 03/07/2019 CLINICAL DATA:  Initial evaluation for acute speech difficulty, stroke-like symptoms. EXAM: MRI HEAD WITHOUT CONTRAST MRA HEAD WITHOUT CONTRAST MRA  NECK WITHOUT CONTRAST TECHNIQUE: Multiplanar, multiecho pulse sequences of the brain and surrounding structures were obtained without intravenous contrast. Angiographic images of the Circle of Willis were obtained using MRA technique without intravenous contrast. Angiographic images of the neck were obtained using MRA technique without intravenous contrast. Carotid stenosis measurements (when applicable) are obtained utilizing NASCET criteria, using the distal internal carotid diameter as the denominator. COMPARISON:  Prior head CT from 03/06/2019. FINDINGS: MRI HEAD FINDINGS Brain: Generalized age-related cerebral atrophy. Patchy and confluent T2/FLAIR hyperintensity within the periventricular and deep white matter both cerebral hemispheres most consistent with chronic small vessel ischemic disease, mild in nature. Several scattered superimposed remote lacunar infarcts seen involving the right basal ganglia and hemispheric cerebral white matter. Chronic superior right cerebellar infarct noted. Patchy small volume restricted diffusion seen involving the cortical and subcortical right frontal operculum, consistent with acute right MCA territory infarct. No associated hemorrhage or mass effect. No other evidence for acute or subacute ischemia. No acute intracranial hemorrhage. No mass lesion, midline shift or mass effect. No hydrocephalus. No extra-axial fluid collection. Pituitary gland suprasellar region normal. Midline structures intact. Vascular: Focus of susceptibility artifact seen at the inferior right sylvian fissure, suspected to reflect thrombus in a right M2 branch, seen on corresponding MRA (series 18, image 28). Major intracranial vascular flow voids are otherwise maintained. Skull and upper cervical spine: Craniocervical junction within normal limits. Postsurgical changes partially visualize within the upper cervical spine. Bone marrow signal intensity normal. No scalp soft tissue abnormality.  Sinuses/Orbits: Patient status post bilateral ocular lens replacement. Paranasal sinuses are clear. No significant mastoid effusion. Inner ear structures normal. Other: None. MRA HEAD FINDINGS ANTERIOR CIRCULATION: Distal cervical segments of the internal carotid arteries are patent with symmetric antegrade flow. Petrous segments patent bilaterally. Scattered atheromatous irregularity within the cavernous/supraclinoid ICAs without high-grade stenosis, slightly worse on the right. A1 segments patent bilaterally. Normal anterior communicating artery. Anterior cerebral arteries widely patent to their distal aspects without stenosis. Right M1 widely patent. Normal right MCA bifurcation. There is focal occlusion of a proximal right M2 branch at the base of the sylvian fissure, corresponding with susceptibility artifacts seen on corresponding MRI (series 7, image 110). Right MCA branches otherwise perfused. Left M1 patent. Normal left MCA bifurcation. Distal left MCA branches well perfused. POSTERIOR CIRCULATION: Mild atheromatous irregularity within the vertebral arteries bilaterally without flow-limiting stenosis. Left vertebral artery dominant. Posterior inferior cerebral arteries patent bilaterally. Basilar patent to its distal aspect without flow-limiting stenosis. Superior cerebral arteries patent bilaterally. Hypoplastic P1 segments with robust bilateral posterior communicating arteries. Scattered atheromatous irregularity throughout the PCAs without high-grade flow-limiting stenosis. Distal small vessel  atheromatous irregularity seen throughout the intracranial circulation. No intracranial aneurysm. MRA NECK FINDINGS AORTIC ARCH: Visualized aortic arch of normal caliber with normal 3 vessel morphology. No hemodynamically significant stenosis seen about the origin of the great vessels. Visualized subclavian arteries widely patent. RIGHT CAROTID SYSTEM: Right common carotid artery widely patent from its origin to  the bifurcation. Mild atheromatous irregularity about the right bifurcation and proximal right ICA without hemodynamically significant stenosis. Right ICA widely patent distally to the skull base without stenosis or occlusion. LEFT CAROTID SYSTEM: Left common carotid artery widely patent from its origin to the bifurcation without stenosis. Mild atheromatous irregularity about the left bifurcation/proximal left ICA without hemodynamically significant stenosis. Left ICA widely patent distally to the skull base without stenosis or occlusion. VERTEBRAL ARTERIES: Both vertebral arteries arise from the subclavian arteries. Vertebral arteries not well assessed proximally. Left vertebral artery dominant. Visualized vertebral arteries widely patent within the neck without stenosis or occlusion. IMPRESSION: MRI HEAD IMPRESSION: 1. Patchy small volume acute ischemic nonhemorrhagic right MCA territory infarct involving the right frontal operculum. 2. Underlying moderate chronic microvascular ischemic disease with scattered remote lacunar infarcts within the right basal ganglia and hemispheric cerebral white matter. 3. Superimposed chronic superior right cerebellar infarct. MRA HEAD IMPRESSION: 1. Acute right M2 occlusion, consistent with the right MCA territory infarct. 2. Moderate atherosclerotic change elsewhere throughout the intracranial circulation. No other hemodynamically significant or correctable stenosis. MRA NECK IMPRESSION: 1. Mild atheromatous irregularity about the carotid bifurcations/proximal ICAs bilaterally without hemodynamically significant or critical flow limiting stenosis. Both carotid artery systems otherwise widely patent within the neck. 2. Wide patency of the vertebral arteries within the neck. Left vertebral artery dominant Electronically Signed   By: Jeannine Boga M.D.   On: 03/07/2019 05:24   Ct Head Code Stroke Wo Contrast  Result Date: 03/06/2019 CLINICAL DATA:  Code stroke. Slurred  speech. Left-sided facial droop. Symptoms began 1900 hours. Headache. EXAM: CT HEAD WITHOUT CONTRAST TECHNIQUE: Contiguous axial images were obtained from the base of the skull through the vertex without intravenous contrast. COMPARISON:  MRI 12/20/2017.  CT 12/19/2017. FINDINGS: Brain: Old infarctions affect the right cerebellum. Cerebral hemispheres show early cortical low-density in the right frontal operculum. There is old infarction in the radiating white matter tracts on the right. No mass lesion, hemorrhage, hydrocephalus or extra-axial collection. Vascular: There is atherosclerotic calcification of the major vessels at the base of the brain. Skull: Normal Sinuses/Orbits: Clear/normal Other: None ASPECTS ([place] Stroke Program Early CT Score) - Ganglionic level infarction (caudate, lentiform nuclei, internal capsule, insula, M1-M3 cortex): 6 - Supraganglionic infarction (M4-M6 cortex): 3 Total score (0-10 with 10 being normal): 9 IMPRESSION: 1. Question loss of gray-white differentiation in the right frontal operculum, M1 sector. No evidence of hemorrhage or mass effect. Old infarctions in the right cerebellum and right radiating white matter tracts. 2. ASPECTS is 9 3. These results were called by telephone at the time of interpretation on 03/06/2019 at 9:44 pm to Dr. Aletta Edouard , who verbally acknowledged these results. Electronically Signed   By: Nelson Chimes M.D.   On: 03/06/2019 21:47   PHYSICAL EXAM    Temp:  [97.6 F (36.4 C)-98.5 F (36.9 C)] 97.6 F (36.4 C) (09/05 1100) Pulse Rate:  [72-97] 97 (09/05 1100) Resp:  [16-18] 17 (09/05 1100) BP: (120-196)/(59-101) 170/76 (09/05 1100) SpO2:  [94 %-99 %] 97 % (09/05 1100)  General - Well nourished, well developed, lethargic.  Ophthalmologic - fundi not visualized due to noncooperation.  Cardiovascular -  irregularly irregular heart rate and rhythm.  Neuro - AAO x3 but lethargic.  No aphasia, however moderate dysarthria.  Follows all  simple commands.  Naming 2/3, able to repeat.  PERRLA, EOMI, blinking to visual threat bilaterally.  Left facial droop, tongue midline.  Right upper extremity proximal 4/5, distal 5/5, left upper extremity proximal 3/5 and distal 4+/5.  Lateral lower extremity 3/5 proximal and 4/5 distal.  Finger-to-nose intact bilaterally but on the left slow action.  Sensation symmetrical, DTR 1+, no Babinski.  Gait not tested   ASSESSMENT/PLAN Ms. [name] is a 78 y.o. female with history of Afib on home coumadin, CAD, CHF, PE, prior CVA, aortic stenosis, HTN who was admitted for Acute ischemic R M1 territory infarct.  Stroke: Acute ischemic right frontal MCA territory scattered infarcts secondary to acute R M2 branch occlusion from Afib on coumadin with subtherapeutic INR  CT head loss of fray-white differentiation in R frontal operculum, old right cerebellum infarct  MRI- Patchy small volume acute ischemic nonhemorrhagic right MCA territory infarct involving the right frontal operculum.  MRA head and neck - Acute right M2 occlusion, consistent with the right MCA territory infarct.  Echo EF > 65%  LDL 107  HgbA1c 6.2  IV heparin for VTE ppx  On aspirin 81 and the Coumadin prior to admission, currently now on heparin IV.  Recommend switch to DOAC once p.o. access.  Aspirin 81 can be continued once p.o. access.  Therapy recommendations: Home health PT/OT  Disposition:  pending  Atrial fibrillation on Coumadin  INR on admission 1.6  Rate controlled  Currently on heparin IV  Recommend switch to DOAC once p.o. access  Hypertension . table . Permissive hypertension (OK if <180/105) for 24-48 hours post stroke and then gradually normalized within 3-5 days.  Long term BP goal normotensive  Hyperlipidemia  Home meds: Vytorin  LDL 107, goal < 70  Resume aspirin once p.o. access  Continue statin at discharge  Dysphagia  Did not pass swallow  Currently n.p.o.  Speech on  board  May consider core track for nutrition and medication  Consider DOAC, ASA and statin once p.o. access  Other Stroke Risk Factors  Advanced age  Hx stroke/TIA  History of PE  Coronary artery disease  Congestive heart failure  Hospital day # 1  Neurology will sign off. Please call with questions. Pt will follow up with stroke clinic NP at [facility] in about 4 weeks. Thanks for the consult.  Rosalin Hawking, MD PhD Stroke Neurology 03/08/2019 4:46 PM    To contact Stroke Continuity provider, please refer to [email or web address] After hours, contact General Neurology

## 2019-03-08 NOTE — Progress Notes (Signed)
[facility] team not available until Monday/Tues. Failed attempted with NGT placed with some epistaxis while on Heparin drip.  For now will stop further attempts. Will place her on Accuchecks AC HS with D51/2 NS at 75cc/hr.  If epistaxis worsens, will hold heparin.

## 2019-03-08 NOTE — Progress Notes (Signed)
Initial Nutrition Assessment  DOCUMENTATION CODES:   Not applicable  INTERVENTION:  Initiate Osmolite 1.2 @ 20 ml/hr, advance 10 ml every 4 hours to goal rate of 60 ml/hr  -Tube feed regimen at goal rate provides 1728 kcal (100% of needs) 80 grams protein, and 1181 ml H2O   NUTRITION DIAGNOSIS:   Inadequate oral intake related to inability to eat as evidenced by NPO status.   GOAL:   Patient will meet greater than or equal to 90% of their needs    MONITOR:   TF tolerance, Diet advancement, I & O's, Labs, Weight trends  REASON FOR ASSESSMENT:   Consult Enteral/tube feeding initiation and management  ASSESSMENT:  RD working remotely.  78 year old female with medical history significant of paroxysmal a-fib on Coumadin, HTN, CHF, CAD, esophageal cancer and anemia. Patient arrived at [facility] as code stroke; not a candidate for TPA d/t being on Coumadin, transferred to [facility]. MRI of brain revealed right MCA in right frontal operculum and rt M2 occlusion  Per chart review, patient continues to have slurred speech. Patient is reported to have requested mashed potatoes and gravy; MD explained severe aspiration risk; patient agreeable to remain NPO and have feeding tube placed.   SLP: dysarthria; anarthria Recommendations: NPO; MBS when patient clinically ready.  Spoke with RN via phone this afternoon who reports currently placing NGT  Current wt 52.2 kg (114.8 lb) Weight history reviewed; noted 4.2 lb wt loss over the past 9 weeks  (3.5% - insignificant for time frame)  Medications reviewed and include: protonix, heparin  Labs reviewed  NUTRITION - FOCUSED PHYSICAL EXAM: Unable to complete at this time; RD working remotely.    Diet Order:   Diet Order            Diet NPO time specified  Diet effective now              EDUCATION NEEDS:   No education needs have been identified at this time  Skin:  Skin Assessment: Reviewed RN Assessment  Last BM:   9/3  Height:   Ht Readings from Last 1 Encounters:  03/06/19 5\' 2"  (1.575 m)    Weight:   Wt Readings from Last 1 Encounters:  03/06/19 52.2 kg    Ideal Body Weight:  50 kg  BMI:  Body mass index is 21.03 kg/m.  Estimated Nutritional Needs:   Kcal:  1550-1750  Protein:  78-89  Fluid:  >/=1.5L/day   Lajuan Lines, RD, LDN [place] Telephone [phone] After Hours/Weekend Pager: [phone]

## 2019-03-08 NOTE — Progress Notes (Signed)
ANTICOAGULATION CONSULT NOTE - Follow Up Consult  Pharmacy Consult for heparin Indication: atrial fibrillation and stroke  Labs: Recent Labs    03/06/19 2132  03/06/19 2133 03/06/19 2137  03/08/19 0334 03/08/19 1233 03/08/19 2219  HGB  --    < > 14.2 14.6  --  13.3  --   --   HCT  --   --  42.6 43.0  --  37.9  --   --   PLT  --   --  171  --   --  146*  --   --   APTT 35  --   --   --   --   --   --   --   LABPROT 18.5*  --   --   --   --   --   --   --   INR 1.6*  --   --   --   --   --   --   --   HEPARINUNFRC  --   --   --   --    < > 0.28* 0.61 0.24*  CREATININE 0.90  --   --  1.00  --   --   --   --    < > = values in this interval not displayed.    Assessment: 78 yo female on heparin drip for afib.  She had some nose bleeding earlier and heparin was held for 2 hours.  Heparin was resumed and heparin level ~ 4 hours after restart 0.24 units/ml.  Goal of Therapy:  Heparin level 0.3-0.5 units/ml   Plan:  Continue heparin drip at 850 units/hr Check heparin level with am labs  Thanks for allowing pharmacy to be a part of this patient's care.  Excell Seltzer, PharmD Clinical Pharmacist  03/08/2019,11:00 PM

## 2019-03-08 NOTE — Progress Notes (Signed)
  Speech Language Pathology Treatment: Dysphagia  Patient Details Name: [name] MRN: [id number] DOB: October 22, 1940 Today's Date: 03/08/2019 Time: [id number] SLP Time Calculation (min) (ACUTE ONLY): 31 min  Assessment / Plan / Recommendation Clinical Impression  Pt today continues issues with coughing on secretions and decreased labial seal bilaterally.  In addition, she demonstrates improved lingual ROM/strength to the right - suprisingly.  Pt continues with severe dysarthria and benefits from moderate cues to repeat word slowly and clearly or spell words.  Provided her with nectar thickened liquids via tsp requiring extended time to orally transit and likely delayed pharyngeal swallow with resulting immediate throat clear and delayed cough.  She then reported sensation of "choking".  Was to trial nectar via straw with chin tuck posture however pt continued to cough extensively.  At this time, she is not appropriate for po diet or MBS.   Pt inquired "what can I do to help?"  She was then provided with ice water via toothette and instructed to swallow.  Head tilt right clinically improved oral transiting.  However she continues with weak reflexive cough after 2nd bolus.  Advised her to importance of oral care to decrease aspiration pna risk with her difficulty managing secretions.  Reached out to MD re: concerns/care plan.  Will follow up Monday 03/10/2019 for readiness for MBS prior to po administration.     HPI HPI: [name] is a 78 y.o. female with past medical history significant for chronic congestive heart failure, coronary artery disease, paroxysmal atrial fibrillation on Coumadin, PE, prior stroke, moderate aortic stenosis, hypertension presented to [facility] emergency department for sudden onset slurred speech as well as left facial droop. MRI reveals acute right M2 occlusion, consistent with the right MCA territory infarct and moderate atherosclerotic change elsewhere  throughout the intracranial circulation.       SLP Plan  Continue with current plan of care;MBS(MBS when pt clinically ready)       Recommendations  Diet recommendations: NPO(oral moisture via toothette with head tilt to the right) Medication Administration: Via alternative means(recommend consider alternative means of nutrition)                Oral Care Recommendations: Oral care QID Follow up Recommendations: None(TBD) SLP Visit Diagnosis: Dysarthria and anarthria (R47.1);Dysphagia, oral phase (R13.11);Dysphagia, oropharyngeal phase (R13.12) Plan: Continue with current plan of care;MBS(MBS when pt clinically ready)       GO                [name] 03/08/2019, 9:15 AM  Luanna Salk, [place] SLP Acute Rehab Services Pager [phone] Office [phone]

## 2019-03-09 ENCOUNTER — Inpatient Hospital Stay (HOSPITAL_COMMUNITY): Payer: Medicare Other

## 2019-03-09 DIAGNOSIS — I63411 Cerebral infarction due to embolism of right middle cerebral artery: Principal | ICD-10-CM

## 2019-03-09 LAB — CBC
HCT: 35.3 % — ABNORMAL LOW (ref 36.0–46.0)
Hemoglobin: 12.2 g/dL (ref 12.0–15.0)
MCH: 29.5 pg (ref 26.0–34.0)
MCHC: 34.6 g/dL (ref 30.0–36.0)
MCV: 85.5 fL (ref 80.0–100.0)
Platelets: 126 10*3/uL — ABNORMAL LOW (ref 150–400)
RBC: 4.13 MIL/uL (ref 3.87–5.11)
RDW: 12.7 % (ref 11.5–15.5)
WBC: 5.4 10*3/uL (ref 4.0–10.5)
nRBC: 0 % (ref 0.0–0.2)

## 2019-03-09 LAB — GLUCOSE, CAPILLARY
Glucose-Capillary: 125 mg/dL — ABNORMAL HIGH (ref 70–99)
Glucose-Capillary: 134 mg/dL — ABNORMAL HIGH (ref 70–99)
Glucose-Capillary: 147 mg/dL — ABNORMAL HIGH (ref 70–99)
Glucose-Capillary: 149 mg/dL — ABNORMAL HIGH (ref 70–99)
Glucose-Capillary: 150 mg/dL — ABNORMAL HIGH (ref 70–99)
Glucose-Capillary: 157 mg/dL — ABNORMAL HIGH (ref 70–99)

## 2019-03-09 LAB — HEPARIN LEVEL (UNFRACTIONATED)
Heparin Unfractionated: 0.37 IU/mL (ref 0.30–0.70)
Heparin Unfractionated: 0.4 IU/mL (ref 0.30–0.70)

## 2019-03-09 MED ORDER — CARVEDILOL 12.5 MG PO TABS: 25.0000 mg | ORAL_TABLET | Freq: Two times a day (BID) | ORAL | Status: DC

## 2019-03-09 MED ORDER — ENALAPRILAT 1.25 MG/ML IV SOLN
1.2500 mg | Freq: Once | INTRAVENOUS | Status: AC
  Administered 2019-03-10: 1.25 mg via INTRAVENOUS
  Filled 2019-03-09: qty 1

## 2019-03-09 MED ORDER — DEXTROSE-NACL 5-0.45 % IV SOLN
INTRAVENOUS | Status: AC
  Administered 2019-03-09 – 2019-03-10 (×3): via INTRAVENOUS

## 2019-03-09 MED ORDER — LABETALOL HCL 5 MG/ML IV SOLN
10.0000 mg | INTRAVENOUS | Status: DC | PRN
  Administered 2019-03-09: 10 mg via INTRAVENOUS
  Administered 2019-03-09: 20 mg via INTRAVENOUS
  Administered 2019-03-09: 10 mg via INTRAVENOUS
  Administered 2019-03-11: 20 mg via INTRAVENOUS
  Filled 2019-03-09 (×4): qty 4

## 2019-03-09 MED ORDER — AMLODIPINE BESYLATE 5 MG PO TABS: 5.0000 mg | ORAL_TABLET | Freq: Every day | ORAL | Status: DC

## 2019-03-09 NOTE — Progress Notes (Signed)
During rounding at this time, patient c/o right face numbness and burning sensation to right ear. Dr Erlinda Hong made aware. Received orders to hold heparin and stat CT head.

## 2019-03-09 NOTE — Progress Notes (Signed)
ANTICOAGULATION CONSULT NOTE - Follow Up Consult  Pharmacy Consult for Heparin Indication: atrial fibrillation and stroke  Patient Measurements: Height: 5\' 2"  (157.5 cm) Weight: 115 lb 11.9 oz (52.5 kg) IBW/kg (Calculated) : 50.1 Heparin Dosing Weight: 52.5 kg  Vital Signs: Temp: 97.9 F (36.6 C) (09/06 0917) Temp Source: Oral (09/06 0917) BP: 181/90 (09/06 0917) Pulse Rate: 78 (09/06 0917)  Labs: Recent Labs    03/06/19 2132  03/06/19 2133 03/06/19 2137  03/08/19 0334 03/08/19 1233 03/08/19 2219 03/09/19 0655  HGB  --    < > 14.2 14.6  --  13.3  --   --  12.2  HCT  --    < > 42.6 43.0  --  37.9  --   --  35.3*  PLT  --   --  171  --   --  146*  --   --  126*  APTT 35  --   --   --   --   --   --   --   --   LABPROT 18.5*  --   --   --   --   --   --   --   --   INR 1.6*  --   --   --   --   --   --   --   --   HEPARINUNFRC  --   --   --   --    < > 0.28* 0.61 0.24* 0.37  CREATININE 0.90  --   --  1.00  --   --   --   --   --    < > = values in this interval not displayed.    Estimated Creatinine Clearance: 36.1 mL/min (by C-G formula based on SCr of 1 mg/dL).   Assessment: 69 YOF who was on chronic warfarin prior to admission presented to the ED with slurred speech. She was found to have an acute stroke and as SUB therapeutic INR at 1.6. Pharmacy was consulted to start IV Heparin.  Heparin level this morning is therapeutic (HL 0.37, goal of 0.3-0.5). Noted epistaxis yesterday - discussed with RN and no issues noted overnight or thus far today. Hgb/Hct slight drop, plts 126 - will watch.  Goal of Therapy:  Heparin level 0.3-0.5 units/ml Monitor platelets by anticoagulation protocol: Yes   Plan:  - Continue Heparin at 850 units/hr (8.5 ml/hr) - Will continue to monitor for any signs/symptoms of bleeding and will follow up with heparin level in 8 hours to confirm therapeutic   Thank you for allowing pharmacy to be a part of this patient's care.  Alycia Rossetti, PharmD, BCPS Clinical Pharmacist Clinical phone for 03/09/2019: [phone] 03/09/2019 9:48 AM   **Pharmacist phone directory can now be found on [email or web address] ([email or web address]).  Listed under [place].

## 2019-03-09 NOTE — Progress Notes (Signed)
Transported patient to CT and back unenventfully.

## 2019-03-09 NOTE — Progress Notes (Signed)
CT scan dept called and made aware os stat CT order.

## 2019-03-09 NOTE — Progress Notes (Signed)
PROGRESS NOTE    [name]  [id number] DOB: 09/15/1940 DOA: 03/06/2019 PCP: Dorothyann Peng, NP   Brief Narrative:  78 year old with history of atrial fibrillation on Coumadin, CHF, prior CVA, aortic stenosis, pulmonary embolism, essential hypertension, CAD came to the hospital with difficulty speaking.  MRI of the brain revealed right MCA territory infarct with occlusion of M2.  Neurology team was consulted.  Seen by speech and swallow therapy.   Assessment & Plan:   Principal Problem:   Stroke [facility]) Active Problems:   PAF (paroxysmal atrial fibrillation) ([place])   Long term current use of anticoagulant therapy   Chronic diastolic congestive heart failure ([name])   Mixed hyperlipidemia   Essential hypertension, benign   Subtherapeutic international normalized ratio (INR)  Acute right MCA CVA with slurred speech - Neurology team following. -Echocardiogram 03/07/2019-ejection fraction 65% with normal wall motion, moderate aortic stenosis -MRI/MRA-right MCA infarct involving right frontal operculum, right M2 occlusion -Hemoglobin A1c 6.2, LDL 107 -Currently on heparin. Eventual Plans for DOAC.  -PT/OT-home health - Speech swallow-severe aspiration risk, n.p.o. - Cortrak placement ordered- Likely unable to do so until Monday. Issues placing NGT with epistaxis. Now on D51/2NS until we can have it placed. Dietician consulted to assistant tube feeds.   Severe dysphagia secondary to acute CVA - Cortrak will need to be placed- ordered. Meantime D51/2NS. Accucehcks.   Chronic diastolic congestive heart failure, ejection fraction 65% -Appears to be euvolemic.  Essential hypertension, slightly elevated -Permissive hypertension.  Hyperlipidemia - LDL 93, needs to be on statin.  Currently n.p.o. until core track placed  Coronary artery disease - No chest pain.  History of pulmonary embolism -Heparin drip. Neuro rec- DOAC when able to take PO  DVT prophylaxis: Heparin bridge  Code Status: Full Family Communication: Son, Herbie [place], at Bedside.  Disposition Plan: Hospital stay until her feeding is addressed.   Consultants:   Neurology  Procedures:   None  Antimicrobials:   None   Subjective: Still feels weak, having issues with swallowing. But overall no new complaints. Son at bedside as well.   Review of Systems Otherwise negative except as per HPI, including: General = no fevers, chills, dizziness, malaise, fatigue HEENT/EYES = negative for pain, redness, loss of vision, double vision, blurred vision, loss of hearing, sore throat, hoarseness, dysphagia Cardiovascular= negative for chest pain, palpitation, murmurs, lower extremity swelling Respiratory/lungs= negative for shortness of breath, cough, hemoptysis, wheezing, mucus production Gastrointestinal= negative for nausea, vomiting,, abdominal pain, melena, hematemesis Genitourinary= negative for Dysuria, Hematuria, Change in Urinary Frequency MSK = Negative for arthralgia, myalgias, Back Pain, Joint swelling  Neurology= Negative for headache, seizures, numbness, tingling  Psychiatry= Negative for anxiety, depression, suicidal and homocidal ideation Allergy/Immunology= Medication/Food allergy as listed  Skin= Negative for Rash, lesions, ulcers, itching   Objective: Vitals:   03/09/19 0003 03/09/19 0134 03/09/19 0428 03/09/19 0917  BP: (!) 172/86  (!) 176/68 (!) 181/90  Pulse: 81  69 78  Resp: 14  18 18   Temp: 98.9 F (37.2 C)  99 F (37.2 C) 97.9 F (36.6 C)  TempSrc:    Oral  SpO2: 95%  97% 97%  Weight:  52.5 kg    Height:        Intake/Output Summary (Last 24 hours) at 03/09/2019 1016 Last data filed at 03/09/2019 0700 Gross per 24 hour  Intake 1396.25 ml  Output -  Net 1396.25 ml   Filed Weights   03/06/19 2213 03/09/19 0134  Weight: 52.2 kg 52.5 kg  Examination:  Constitutional: NAD, calm, comfortable; frail, very pleasant.  Eyes: PERRL, lids and conjunctivae normal  ENMT: Mucous membranes are moist. Posterior pharynx clear of any exudate or lesions.Normal dentition.  Neck: normal, supple, no masses, no thyromegaly Respiratory: clear to auscultation bilaterally, no wheezing, no crackles. Normal respiratory effort. No accessory muscle use.  Cardiovascular: Regular rate and rhythm, no murmurs / rubs / gallops. No extremity edema. 2+ pedal pulses. No carotid bruits.  Abdomen: no tenderness, no masses palpated. No hepatosplenomegaly. Bowel sounds positive.  Musculoskeletal: no clubbing / cyanosis. No joint deformity upper and lower extremities. Good ROM, no contractures. Normal muscle tone.  Skin: no rashes, lesions, ulcers. No induration Neurologic: CN 2-12 grossly intact. Slurred speech.  Psychiatric: Normal judgment and insight. Alert and oriented x 3. Normal mood.    Data Reviewed:   CBC: Recent Labs  Lab 03/06/19 2133 03/06/19 2137 03/08/19 0334 03/09/19 0655  WBC 6.5  --  6.6 5.4  NEUTROABS 3.9  --   --   --   HGB 14.2 14.6 13.3 12.2  HCT 42.6 43.0 37.9 35.3*  MCV 87.5  --  85.7 85.5  PLT 171  --  146* 123XX123*   Basic Metabolic Panel: Recent Labs  Lab 03/06/19 2132 03/06/19 2137  NA 139 140  K 4.1 4.0  CL 102 100  CO2 30  --   GLUCOSE 174* 168*  BUN 13 14  CREATININE 0.90 1.00  CALCIUM 9.0  --    GFR: Estimated Creatinine Clearance: 36.1 mL/min (by C-G formula based on SCr of 1 mg/dL). Liver Function Tests: Recent Labs  Lab 03/06/19 2132  AST 17  ALT 15  ALKPHOS 89  BILITOT 1.0  PROT 7.2  ALBUMIN 4.0   No results for input(s): LIPASE, AMYLASE in the last 168 hours. No results for input(s): AMMONIA in the last 168 hours. Coagulation Profile: Recent Labs  Lab 03/06/19 2132  INR 1.6*   Cardiac Enzymes: No results for input(s): CKTOTAL, CKMB, CKMBINDEX, TROPONINI in the last 168 hours. BNP (last 3 results) No results for input(s): PROBNP in the last 8760 hours. HbA1C: Recent Labs    03/08/19 0334  HGBA1C 6.0*    CBG: Recent Labs  Lab 03/06/19 2131 03/08/19 1709 03/08/19 2053 03/09/19 0000 03/09/19 0916  GLUCAP 164* 134* 127* 125* 157*   Lipid Profile: Recent Labs    03/08/19 0334  CHOL 152  HDL 45  LDLCALC 93  TRIG 70  CHOLHDL 3.4   Thyroid Function Tests: No results for input(s): TSH, T4TOTAL, FREET4, T3FREE, THYROIDAB in the last 72 hours. Anemia Panel: No results for input(s): VITAMINB12, FOLATE, FERRITIN, TIBC, IRON, RETICCTPCT in the last 72 hours. Sepsis Labs: No results for input(s): PROCALCITON, LATICACIDVEN in the last 168 hours.  Recent Results (from the past 240 hour(s))  SARS Coronavirus 2 [facility] order, Performed in [facility] hospital lab) Nasopharyngeal Nasopharyngeal Swab     Status: None   Collection Time: 03/06/19  9:40 PM   Specimen: Nasopharyngeal Swab  Result Value Ref Range Status   SARS Coronavirus 2 NEGATIVE NEGATIVE Final    Comment: (NOTE) If result is NEGATIVE SARS-CoV-2 target nucleic acids are NOT DETECTED. The SARS-CoV-2 RNA is generally detectable in upper and lower  respiratory specimens during the acute phase of infection. The lowest  concentration of SARS-CoV-2 viral copies this assay can detect is 250  copies / mL. A negative result does not preclude SARS-CoV-2 infection  and should not be used as the sole basis  for treatment or other  patient management decisions.  A negative result may occur with  improper specimen collection / handling, submission of specimen other  than nasopharyngeal swab, presence of viral mutation(s) within the  areas targeted by this assay, and inadequate number of viral copies  (<250 copies / mL). A negative result must be combined with clinical  observations, patient history, and epidemiological information. If result is POSITIVE SARS-CoV-2 target nucleic acids are DETECTED. The SARS-CoV-2 RNA is generally detectable in upper and lower  respiratory specimens dur ing the acute phase of infection.  Positive   results are indicative of active infection with SARS-CoV-2.  Clinical  correlation with patient history and other diagnostic information is  necessary to determine patient infection status.  Positive results do  not rule out bacterial infection or co-infection with other viruses. If result is PRESUMPTIVE POSTIVE SARS-CoV-2 nucleic acids MAY BE PRESENT.   A presumptive positive result was obtained on the submitted specimen  and confirmed on repeat testing.  While 2019 novel coronavirus  (SARS-CoV-2) nucleic acids may be present in the submitted sample  additional confirmatory testing may be necessary for epidemiological  and / or clinical management purposes  to differentiate between  SARS-CoV-2 and other Sarbecovirus currently known to infect humans.  If clinically indicated additional testing with an alternate test  methodology [id number]) is advised. The SARS-CoV-2 RNA is generally  detectable in upper and lower respiratory sp ecimens during the acute  phase of infection. The expected result is Negative. Fact Sheet for Patients:  StrictlyIdeas.no Fact Sheet for Healthcare Providers: BankingDealers.co.za This test is not yet approved or cleared by the [place] FDA and has been authorized for detection and/or diagnosis of SARS-CoV-2 by FDA under an Emergency Use Authorization (EUA).  This EUA will remain in effect (meaning this test can be used) for the duration of the COVID-19 declaration under Section 564(b)(1) of the Act, 21 U.S.C. section 360bbb-3(b)(1), unless the authorization is terminated or revoked sooner. Performed at [facility], [address], [place], [place] [address]          Radiology Studies: No results found.      Scheduled Meds: . atorvastatin  40 mg Oral q1800  . pantoprazole (PROTONIX) IV  40 mg Intravenous Q12H   Continuous Infusions: . sodium chloride Stopped (03/07/19 2139)  . sodium chloride  10 mL/hr at 03/08/19 1200  . dextrose 5 % and 0.45% NaCl    . feeding supplement (OSMOLITE 1.2 CAL)    . heparin 850 Units/hr (03/09/19 0700)     LOS: 2 days   Time spent= 35 mins    Ankit Arsenio Loader, MD [facility]  If 7PM-7AM, please contact night-coverage [email or web address] 03/09/2019, 10:16 AM

## 2019-03-09 NOTE — Progress Notes (Signed)
ANTICOAGULATION CONSULT NOTE - Follow Up Consult  Pharmacy Consult for Heparin Indication: atrial fibrillation and stroke  Patient Measurements: Height: 5\' 2"  (157.5 cm) Weight: 115 lb 11.9 oz (52.5 kg) IBW/kg (Calculated) : 50.1 Heparin Dosing Weight: 52.5 kg  Vital Signs: Temp: (P) 98.6 F (37 C) (09/06 1237) Temp Source: (P) Oral (09/06 1237) BP: (P) 189/93 (09/06 1237) Pulse Rate: (P) 83 (09/06 1237)  Labs: Recent Labs    03/06/19 2132  03/06/19 2133 03/06/19 2137  03/08/19 0334  03/08/19 2219 03/09/19 0655 03/09/19 1511  HGB  --    < > 14.2 14.6  --  13.3  --   --  12.2  --   HCT  --    < > 42.6 43.0  --  37.9  --   --  35.3*  --   PLT  --   --  171  --   --  146*  --   --  126*  --   APTT 35  --   --   --   --   --   --   --   --   --   LABPROT 18.5*  --   --   --   --   --   --   --   --   --   INR 1.6*  --   --   --   --   --   --   --   --   --   HEPARINUNFRC  --   --   --   --    < > 0.28*   < > 0.24* 0.37 0.40  CREATININE 0.90  --   --  1.00  --   --   --   --   --   --    < > = values in this interval not displayed.    Estimated Creatinine Clearance: 36.1 mL/min (by C-G formula based on SCr of 1 mg/dL).   Assessment: 83 YOF who was on chronic warfarin prior to admission presented to the ED with slurred speech. She was found to have an acute stroke and as SUB therapeutic INR at 1.6. Pharmacy was consulted to start IV Heparin.  Heparin remains therapeutic with confirmatory recheck,  Goal of Therapy:  Heparin level 0.3-0.5 units/ml Monitor platelets by anticoagulation protocol: Yes   Plan:  - Continue Heparin at 850 units/hr (8.5 ml/hr) - Check heparin level and CBC daily   Arrie Senate, PharmD, BCPS Clinical Pharmacist [phone] Please check AMION for all [place] numbers 03/09/2019

## 2019-03-09 NOTE — Plan of Care (Signed)
Called by RN that pt complains of right face to right ear burning sensation. Since she is on heparin IV and BP is elevated, will do stat CT to rule out brain bleed. Her BP 170-200 and she is out of the permissive hypertension window, her BP should be gradually decrease to lower than 160. Put on labetalol 10-20mg  Q2h PRN for SBP > 160. If stat CT no bleeding and BP under control, heparin IV can be resumed. Text paged Dr. Reesa Chew.   Rosalin Hawking, MD PhD Stroke Neurology 03/09/2019 5:40 PM

## 2019-03-10 ENCOUNTER — Inpatient Hospital Stay (HOSPITAL_COMMUNITY): Payer: Medicare Other

## 2019-03-10 LAB — BASIC METABOLIC PANEL
Anion gap: 8 (ref 5–15)
BUN: 7 mg/dL — ABNORMAL LOW (ref 8–23)
CO2: 24 mmol/L (ref 22–32)
Calcium: 8.3 mg/dL — ABNORMAL LOW (ref 8.9–10.3)
Chloride: 105 mmol/L (ref 98–111)
Creatinine, Ser: 0.77 mg/dL (ref 0.44–1.00)
GFR calc Af Amer: >60 mL/min (ref >60)
GFR calc non Af Amer: >60 mL/min (ref >60)
Glucose, Bld: 154 mg/dL — ABNORMAL HIGH (ref 70–99)
Potassium: 3.2 mmol/L — ABNORMAL LOW (ref 3.5–5.1)
Sodium: 137 mmol/L (ref 135–145)

## 2019-03-10 LAB — CBC
HCT: 34.3 % — ABNORMAL LOW (ref 36.0–46.0)
Hemoglobin: 11.9 g/dL — ABNORMAL LOW (ref 12.0–15.0)
MCH: 29.7 pg (ref 26.0–34.0)
MCHC: 34.7 g/dL (ref 30.0–36.0)
MCV: 85.5 fL (ref 80.0–100.0)
Platelets: 120 K/uL — ABNORMAL LOW (ref 150–400)
RBC: 4.01 MIL/uL (ref 3.87–5.11)
RDW: 12.5 % (ref 11.5–15.5)
WBC: 4.5 K/uL (ref 4.0–10.5)
nRBC: 0 % (ref 0.0–0.2)

## 2019-03-10 LAB — GLUCOSE, CAPILLARY
Glucose-Capillary: 149 mg/dL — ABNORMAL HIGH (ref 70–99)
Glucose-Capillary: 154 mg/dL — ABNORMAL HIGH (ref 70–99)
Glucose-Capillary: 132 mg/dL — ABNORMAL HIGH (ref 70–99)
Glucose-Capillary: 159 mg/dL — ABNORMAL HIGH (ref 70–99)
Glucose-Capillary: 125 mg/dL — ABNORMAL HIGH (ref 70–99)

## 2019-03-10 LAB — HEPARIN LEVEL (UNFRACTIONATED)
Heparin Unfractionated: 0.19 [IU]/mL — ABNORMAL LOW (ref 0.30–0.70)
Heparin Unfractionated: 0.28 IU/mL — ABNORMAL LOW (ref 0.30–0.70)
Heparin Unfractionated: <0.1 IU/mL — ABNORMAL LOW (ref 0.30–0.70)

## 2019-03-10 LAB — MAGNESIUM: Magnesium: 1.8 mg/dL (ref 1.7–2.4)

## 2019-03-10 MED ORDER — POTASSIUM CHLORIDE CRYS ER 20 MEQ PO TBCR
40.0000 meq | EXTENDED_RELEASE_TABLET | Freq: Once | ORAL | Status: AC
  Administered 2019-03-10: 40 meq via ORAL
  Filled 2019-03-10: qty 2

## 2019-03-10 MED ORDER — RESOURCE THICKENUP CLEAR PO POWD
ORAL | Status: DC | PRN
  Filled 2019-03-10: qty 125

## 2019-03-10 MED ORDER — ASPIRIN EC 81 MG PO TBEC
81.0000 mg | DELAYED_RELEASE_TABLET | Freq: Every day | ORAL | Status: DC
  Administered 2019-03-10 – 2019-03-12 (×3): 81 mg via ORAL
  Filled 2019-03-10 (×3): qty 1

## 2019-03-10 MED ORDER — PANTOPRAZOLE SODIUM 40 MG PO TBEC
40.0000 mg | DELAYED_RELEASE_TABLET | Freq: Two times a day (BID) | ORAL | Status: DC
  Administered 2019-03-10 – 2019-03-12 (×5): 40 mg via ORAL
  Filled 2019-03-10 (×5): qty 1

## 2019-03-10 MED ORDER — AMLODIPINE BESYLATE 5 MG PO TABS
5.0000 mg | ORAL_TABLET | Freq: Every day | ORAL | Status: DC
  Administered 2019-03-10 – 2019-03-12 (×3): 5 mg via ORAL
  Filled 2019-03-10 (×3): qty 1

## 2019-03-10 MED ORDER — POTASSIUM CHLORIDE 10 MEQ/100ML IV SOLN
10.0000 meq | INTRAVENOUS | Status: AC
  Administered 2019-03-10 (×3): 10 meq via INTRAVENOUS
  Filled 2019-03-10 (×3): qty 100

## 2019-03-10 MED ORDER — CARVEDILOL 12.5 MG PO TABS
25.0000 mg | ORAL_TABLET | Freq: Two times a day (BID) | ORAL | Status: DC
  Administered 2019-03-10 – 2019-03-12 (×4): 25 mg via ORAL
  Filled 2019-03-10 (×4): qty 2

## 2019-03-10 MED ORDER — APIXABAN 5 MG PO TABS
5.0000 mg | ORAL_TABLET | Freq: Two times a day (BID) | ORAL | Status: DC
  Administered 2019-03-10 – 2019-03-12 (×4): 5 mg via ORAL
  Filled 2019-03-10 (×4): qty 1

## 2019-03-10 MED ORDER — MAGNESIUM OXIDE 400 (241.3 MG) MG PO TABS
800.0000 mg | ORAL_TABLET | Freq: Once | ORAL | Status: AC
  Administered 2019-03-10: 800 mg via ORAL
  Filled 2019-03-10: qty 2

## 2019-03-10 NOTE — Progress Notes (Signed)
ANTICOAGULATION CONSULT NOTE - Initial Consult  Pharmacy Consult for Apixaban Indication: atrial fibrillation and stroke  Allergies  Allergen Reactions  . Iodinated Diagnostic Agents Anaphylaxis and Other (See Comments)    IPD dye Info given by patient  . Ioxaglate Anaphylaxis and Other (See Comments)    Info given by patient  . Red Dye Anaphylaxis  . Lactose Intolerance (Gi)   . Whey Other (See Comments)    Lactose intolerance  . Darvon [Propoxyphene] Rash  . Hydralazine Anxiety and Other (See Comments)    Facial flushing, pt prefers not to use it.   . Milk-Related Compounds Other (See Comments)    Lactose intolerance Can tolerate milk if its cooked into the recipe, just can't drink milk     Patient Measurements: Height: 5\' 2"  (157.5 cm) Weight: 115 lb 8.3 oz (52.4 kg) IBW/kg (Calculated) : 50.1  Vital Signs: Temp: 97.8 F (36.6 C) (09/07 1528) Temp Source: Oral (09/07 1528) BP: 156/82 (09/07 1528) Pulse Rate: 77 (09/07 1100)  Labs: Recent Labs    03/08/19 0334  03/09/19 0655  03/10/19 0449 03/10/19 0933 03/10/19 1759  HGB 13.3  --  12.2  --  11.9*  --   --   HCT 37.9  --  35.3*  --  34.3*  --   --   PLT 146*  --  126*  --  120*  --   --   HEPARINUNFRC 0.28*   < > 0.37   < > 0.19* 0.28* <0.10*  CREATININE  --   --   --   --  0.77  --   --    < > = values in this interval not displayed.    Estimated Creatinine Clearance: 45.1 mL/min (by C-G formula based on SCr of 0.77 mg/dL).   Medical History: Past Medical History:  Diagnosis Date  . Anemia 2005   Generally microcytic, transfusions in [address], 2012, 02/2015, 05/2015  . Aortic stenosis    Moderate November 2017  . Arthritis   . Broken back 2013   Chronic back pain.   [name] CAD (coronary artery disease)    Cardiac catheterization 2014 - 80% mid RCA and 70% OM managed medically  . CHF (congestive heart failure) ([place])   . Chronic bilateral pleural effusions   . Chronic headache   . Contrast media allergy    . Diastolic heart failure ([place])   . Esophageal cancer ([place]) 05/06/2015   Adenocarcinoma GE junction  . Facial paresthesia   . GERD (gastroesophageal reflux disease)   . H. pylori infection   . H/O iron deficiency    05-06-15 iron infusion ([facility])  . HH (hiatus hernia) 2008   Large with associated erosions  . Hypertension   . Paroxysmal atrial fibrillation ([name])   . PONV (postoperative nausea and vomiting)   . Pulmonary emboli ([place]) 2008, 2012  . Stroke [facility]) 1982    Medications:  Scheduled:  . amLODipine  5 mg Oral Daily  . aspirin EC  81 mg Oral Daily  . atorvastatin  40 mg Oral q1800  . carvedilol  25 mg Oral BID WC  . pantoprazole  40 mg Oral BID   Infusions:  . sodium chloride Stopped (03/07/19 2139)  . sodium chloride 10 mL/hr at 03/08/19 1200  . dextrose 5 % and 0.45% NaCl 75 mL/hr at 03/10/19 0616  . feeding supplement (OSMOLITE 1.2 CAL)      Assessment: 78 yo F on Coumadin PTA for hx PAF.  Pt presented  with new CVA and started on heparin infusion.  Now with plans to transition to Apixaban now that patient can take PO meds.  Heparin was stopped 9/7 ~ 1230.  OK to start Apixaban now.  Goal of Therapy:  Therapeutic Anticoagulation Monitor platelets by anticoagulation protocol: Yes   Plan:  Apixaban 5mg  PO BID Of note, patient will meet criteria for reduced dose of 2.5mg  BID when she turns 78 years old. Follow-up for signs and symptoms of bleed  [employer], Pharm.D., BCPS Clinical Pharmacist  **Pharmacist phone directory can now be found on [email or web address] ([email or web address]).  Listed under [place].  03/10/2019 8:20 PM   Bob Eastwood, [place]s 03/10/2019,8:17 PM

## 2019-03-10 NOTE — Progress Notes (Signed)
  Speech Language Pathology Treatment: Dysphagia  Patient Details Name: [name] MRN: [id number] DOB: 12-15-1940 Today's Date: 03/10/2019 Time: [id number] SLP Time Calculation (min) (ACUTE ONLY): 12 min  Assessment / Plan / Recommendation Clinical Impression  Upon SLP arrival to room, granddaughter [name] present and thus she and pt were educated to findings of MBS, dietary recommendations as well as SLP answering questions regarding care plan.  Pt reports MD indicates she will discharge on Wednesday home, thus advise will follow clinically to determine readiness for possible repeat testing and/or dietary advancement as well as need for oral suctioning.  Pt again confirms premorbid dysphagia.  RMST will be indicated to improve cough strength thus airway protection.   Pt will need full supervision to conduct adequate chin tuck for airway protection; SLP had pt and granddaughter read the swallow precaution signs including reviewing clinical reasoning for dietary modifications with teach back; both understood and taught back precautions but anticipate pt will need cues to generalize during intake.  Pt and granddaughter agreeable to plan - pt is very motivated to "eat today".  Educated pt and [name] to drinks that are naturally nectar thick and reviewed instructions on using thickener.  Requested RN provide them with menu to allow pt to order her meal.    Of note, pt continues to report right facial numbness to be consistent with findings from yesterday.     HPI HPI: [name] is a 78 y.o. female with past medical history significant for chronic congestive heart failure, coronary artery disease, paroxysmal atrial fibrillation on Coumadin, PE, prior stroke, moderate aortic stenosis, hypertension presented to [facility] emergency department for sudden onset slurred speech as well as left facial droop. MRI reveals acute right M2 occlusion, consistent with the right MCA territory  infarct and moderate atherosclerotic change elsewhere throughout the intracranial circulation. Pt with changes yesterday resuting in increased right facial numbness - thus CT showed Old right cerebellar and left thalamic infarcts and right frontal hypodensity c/w ischemic cva.      SLP Plan  Continue with current plan of care       Recommendations  Diet recommendations: Nectar-thick liquid(full liquids) Liquids provided via: Straw Medication Administration: Crushed with puree Supervision: Patient able to self feed;Full supervision/cueing for compensatory strategies([name] granddaughter providing supervision is adequate) Compensations: Slow rate;Small sips/bites;Chin tuck Postural Changes and/or Swallow Maneuvers: Upright 30-60 min after meal;Seated upright 90 degrees;Chin tuck("intermittent dry swallow", "hock" to clear pharynx of residuals - strengthen!)                Oral Care Recommendations: Oral care QID Follow up Recommendations: Inpatient Rehab SLP Visit Diagnosis: Dysarthria and anarthria (R47.1);Dysphagia, oral phase (R13.11);Dysphagia, oropharyngeal phase (R13.12) Plan: Continue with current plan of care       [place], [name] 03/10/2019, 11:56 AM  [name], [name] SLP Acute Rehab Services Pager [phone] Office [phone]

## 2019-03-10 NOTE — Progress Notes (Signed)
ANTICOAGULATION CONSULT NOTE - Follow Up Consult  Pharmacy Consult for heparin Indication: atrial fibrillation and stroke   Labs: Recent Labs    03/08/19 0334  03/09/19 0655 03/09/19 1511 03/10/19 0449  HGB 13.3  --  12.2  --   --   HCT 37.9  --  35.3*  --   --   PLT 146*  --  126*  --   --   HEPARINUNFRC 0.28*   < > 0.37 0.40 0.19*  CREATININE  --   --   --   --  0.77   < > = values in this interval not displayed.    Assessment/Plan:  78yo female now subtherapeutic on heparin after two levels at goal though per RN the gtt had been turned off and on a few times overnight for elevated BP; also needed to go to CT for R face numbness and R ear burning though CT was negative and heparin resumed. Will continue gtt at current rate for now and check additional level.   Wynona Neat, PharmD, BCPS  03/10/2019,5:54 AM

## 2019-03-10 NOTE — Evaluation (Signed)
Occupational Therapy Evaluation Patient Details Name: [name] MRN: [id number] DOB: 11/06/1940 Today's Date: 03/10/2019    History of Present Illness Patient is a 78 year old female admitted from home with slurred speech and drooling.FOund to have R MCA infarct. PMH to include: afib, HTN, CHF, CAD.   Clinical Impression   Pt noted to have HR 155 with movement this session and RN aware. Pt progressing well and adequate level to d/c home with family (A). Family present during session and verified ability to help at this level.      Follow Up Recommendations  Home health OT    Equipment Recommendations  None recommended by OT    Recommendations for Other Services       Precautions / Restrictions Precautions Precautions: Fall Restrictions Weight Bearing Restrictions: No      Mobility Bed Mobility               General bed mobility comments: oob in chair on arrival  Transfers Overall transfer level: Modified independent Equipment used: None Transfers: Sit to/from Stand Sit to Stand: Min guard         General transfer comment: Min guard for steadying assist.     Balance Overall balance assessment: Needs assistance Sitting-balance support: No upper extremity supported;Feet supported Sitting balance-Leahy Scale: Good     Standing balance support: During functional activity;No upper extremity supported;Single extremity supported Standing balance-Leahy Scale: Fair                             ADL either performed or assessed with clinical judgement   ADL Overall ADL's : Modified independent                                             Vision         Perception     Praxis      Pertinent Vitals/Pain Pain Assessment: No/denies pain     Hand Dominance     Extremity/Trunk Assessment Upper Extremity Assessment Upper Extremity Assessment: Overall WFL for tasks assessed           Communication     Cognition  Arousal/Alertness: Awake/alert Behavior During Therapy: WFL for tasks assessed/performed Overall Cognitive Status: Within Functional Limits for tasks assessed                                     General Comments  granddaughter present during session. HR noted to be 155 with movement. pt 120 in chair after session at rest and then reduce to 90s    Exercises     Shoulder Instructions      Home Living                                          Prior Functioning/Environment                   OT Problem List:        OT Treatment/Interventions:      OT Goals(Current goals can be found in the care plan section) Acute Rehab OT Goals Patient Stated Goal: to get this swallowing under control OT  Goal Formulation: With patient Time For Goal Achievement: 03/21/19 Potential to Achieve Goals: Good ADL Goals Pt Will Transfer to Toilet: with supervision;regular height toilet Additional ADL Goal #1: pt will complete L UE HEP program MOD I for strengthening Additional ADL Goal #2: pt will locate 3 objects in L visual field during adl task mod i  OT Frequency: Min 3X/week   Barriers to D/C:            Co-evaluation              AM-PAC OT "6 Clicks" Daily Activity     Outcome Measure Help from another person eating meals?: None Help from another person taking care of personal grooming?: None Help from another person toileting, which includes using toliet, bedpan, or urinal?: None Help from another person bathing (including washing, rinsing, drying)?: None Help from another person to put on and taking off regular upper body clothing?: None Help from another person to put on and taking off regular lower body clothing?: None 6 Click Score: 24   End of Session Equipment Utilized During Treatment: Gait belt Nurse Communication: Mobility status;Precautions  Activity Tolerance: Patient tolerated treatment well Patient left: in chair;with call  bell/phone within reach;with chair alarm set  OT Visit Diagnosis: Unsteadiness on feet (R26.81);Muscle weakness (generalized) (M62.81)                Time: [id number] OT Time Calculation (min): 14 min Charges:  OT General Charges $OT Visit: 1 Visit OT Treatments $Self Care/Home Management : 8-22 mins   Jeri Modena, OTR/L  Acute Rehabilitation Services Pager: [phone] Office: [phone] .   Jeri Modena 03/10/2019, 3:25 PM

## 2019-03-10 NOTE — Progress Notes (Signed)
  Speech Language Pathology Treatment: Dysphagia  Patient Details Name: [name] MRN: [id number] DOB: [birth year] Today's Date: 03/10/2019 Time: [id number] SLP Time Calculation (min) (ACUTE ONLY): 14 min  Assessment / Plan / Recommendation Clinical Impression  Pt with much improved articulation abilities and swallow function. Continues with some difficulty with secretions but significantly improved.  Pt unable to successfully have Cortrak placed, she reports difficulty with tube entering into esophagus.  SLP questions prominent CP muscle.  Observed pt consuming yogurt, nectar juice and moisture via toothette.  No overt indication of aspiration.  Multiple swallows noted across consistencies which advised was to clear oropharynx *x3 swallow with puree.  Subtle throat clearing noted with thin moisture which may be indicative of laryngeal penetration.  Pt has worked this weekend on her swallowing using toothettes as advised by this SLP.  Today she reports "I am going to eat today".  Will plan MBS to allow viewing of musculature, swallow function in hopes to transition to po today.  Do not recommend placing Cortrak at this time, please hold as suspect pt will be able to consume po diet.  Pt and RN as well as xray informed of plan and agreeable.  Provided pt with toothettes, oral moisturizer and yankeur's suction catheter.      HPI HPI: [name] is a 78 y.o. female with past medical history significant for chronic congestive heart failure, coronary artery disease, paroxysmal atrial fibrillation on Coumadin, PE, prior stroke, moderate aortic stenosis, hypertension presented to [facility] emergency department for sudden onset slurred speech as well as left facial droop. MRI reveals acute right M2 occlusion, consistent with the right MCA territory infarct and moderate atherosclerotic change elsewhere throughout the intracranial circulation. Pt with changes yesterday resuting in increased right  facial numbness - thus CT showed Old right cerebellar and left thalamic infarcts and right frontal hypodensity c/w ischemic cva.      SLP Plan  Continue with current plan of care;MBS       Recommendations  Diet recommendations: Other(comment)(oral moisture via toothette) Medication Administration: Via alternative means(recommend consider alternative means of nutrition)                Oral Care Recommendations: Oral care QID Follow up Recommendations: None(TBD) SLP Visit Diagnosis: Dysarthria and anarthria (R47.1);Dysphagia, oral phase (R13.11);Dysphagia, oropharyngeal phase (R13.12) Plan: Continue with current plan of care;MBS(MBS when pt clinically ready)       GO                [name] 03/10/2019, 8:04 AM  Luanna Salk, MS [facility] SLP Acute Rehab Services Pager [phone] Office [phone]

## 2019-03-10 NOTE — Progress Notes (Signed)
Modified Barium Swallow Progress Note  Patient Details  Name: [name] MRN: [id number] Date of Birth: 05-06-1941  Today's Date: 03/10/2019  Modified Barium Swallow completed.  Full report located under Chart Review in the Imaging Section.  Brief recommendations include the following:  Clinical Impression  Patient presents with moderate oropharyngeal dysphagia with sensorimotor deficits.   Decreased lingual strength/control results in lingual pumping and premature spillage of all boluses into pharynx/larynx before reflexive swallow triggered.  Thin via straw despite chin tuck is aspirated significantly with pt performing reflexive cough that did not clear aspirates. Decreasing bolus size to tsp was helpful to prevent aspiration.  Decreased tongue base retraction and laryngeal elevation allows vallecular residuals across consistencies due to poor epiglottic deflection, ? impact of curvature of anterior spine near cervical fusions. Pt does not sense residuals consistently.  Mastication ability with solids were severely impaired with pt swallowing only minimally masticated cracker.  Head turn left did not decrease residuals but dry swallows effective.  Recommend start full liquid - nectar diet- with strict precautions.  As previously stated, suspect baseline dysphagia -possible impact of cervical fusions and prior strokes on her swallowing skills PTA.  Tsps of water between meals after oral care is recommended. Only thickened drinks with medications, all other intake.  Pt will benefit from skilled SLP to maximize her swallowing rehab, expectoration ability for pharyngeal clearance and cough strength for airway protection.  Using teach back with live monitor during study, educated pt to findings, precautions.   Swallow Evaluation Recommendations       SLP Diet Recommendations: Nectar thick liquid(tsps water with chin tuck ok between meals)   Liquid Administration via: Straw   Medication  Administration: Crushed with puree- start and follow with nectar   Supervision: Full supervision/cueing for compensatory strategies   Compensations: Slow rate;Small sips/bites;Chin tuck(if coughing, pt is aspirating !  stop and take break), "hock" to clear pharynx, intermittent dry swallow       Oral Care Recommendations: Oral care BID   Other Recommendations: Have oral suction available  Luanna Salk, [place] [facility] SLP Acute Rehab Services Pager [phone] Office [phone]  [name] 03/10/2019,10:34 AM

## 2019-03-10 NOTE — Progress Notes (Signed)
PROGRESS NOTE    [name]  [id number] DOB: 08-05-1940 DOA: 03/06/2019 PCP: Dorothyann Peng, NP   Brief Narrative:  78 year old with history of atrial fibrillation on Coumadin, CHF, prior CVA, aortic stenosis, pulmonary embolism, essential hypertension, CAD came to the hospital with difficulty speaking.  MRI of the brain revealed right MCA territory infarct with occlusion of M2.  Neurology team was consulted.  Seen by speech and swallow therapy.   Assessment & Plan:   Principal Problem:   Stroke [facility]) Active Problems:   PAF (paroxysmal atrial fibrillation) ([place])   Long term current use of anticoagulant therapy   Chronic diastolic congestive heart failure ([name])   Mixed hyperlipidemia   Essential hypertension, benign   Subtherapeutic international normalized ratio (INR)  Acute right MCA CVA with slurred speech Right-sided facial numbness, improved - Neurology team following.  Repeat CT head 9/6- no acute intracranial pathology -Echocardiogram 03/07/2019-ejection fraction 65% with normal wall motion, moderate aortic stenosis -MRI/MRA-right MCA infarct involving right frontal operculum, right M2 occlusion -Hemoglobin A1c 6.2, LDL 107 -Currently on heparin. Eventual Plans for DOAC.  -PT/OT-home health - Speech swallow- initially patient was severe aspiration risk but she has shown some improvement.  Started on full liquid diet today.  Severe dysphagia secondary to acute CVA - Full liquid diet started as she has shown improvement with speech and swallow.  If tolerating oral intake then we can discontinue IV fluids  Chronic diastolic congestive heart failure, ejection fraction 65% -Appears to be euvolemic.  Essential hypertension, slightly elevated -Permissive hypertension.  Hyperlipidemia - LDL 93, needs to be on statin.    Coronary artery disease - No chest pain.  History of pulmonary embolism -Heparin drip. Neuro rec- DOAC when able to take PO consistently  DVT  prophylaxis: Heparin bridge Code Status: Full Family Communication: None at bedside Disposition Plan: Hospital stay until her feeding is addressed.   Consultants:   Neurology  Procedures:   None  Antimicrobials:   None   Subjective: Slurred speech is improved.  Seen by speech and swallow therapist this morning and diet has been advanced to full liquid.  Review of Systems Otherwise negative except as per HPI, including: General = no fevers, chills, dizziness, malaise, fatigue HEENT/EYES = negative for pain, redness, loss of vision, double vision, blurred vision, loss of hearing, sore throat, hoarseness, dysphagia Cardiovascular= negative for chest pain, palpitation, murmurs, lower extremity swelling Respiratory/lungs= negative for shortness of breath, cough, hemoptysis, wheezing, mucus production Gastrointestinal= negative for nausea, vomiting,, abdominal pain, melena, hematemesis Genitourinary= negative for Dysuria, Hematuria, Change in Urinary Frequency MSK = Negative for arthralgia, myalgias, Back Pain, Joint swelling  Neurology= Negative for headache, seizures, numbness, tingling  Psychiatry= Negative for anxiety, depression, suicidal and homocidal ideation Allergy/Immunology= Medication/Food allergy as listed  Skin= Negative for Rash, lesions, ulcers, itching   Objective: Vitals:   03/10/19 0034 03/10/19 0147 03/10/19 0429 03/10/19 0735  BP: (!) 142/100 (!) 153/66 (!) 144/52 (!) 148/52  Pulse: 82 65 74 76  Resp:   16 18  Temp:   97.9 F (36.6 C) 98 F (36.7 C)  TempSrc:    Oral  SpO2:   97% 98%  Weight:  52.4 kg    Height:        Intake/Output Summary (Last 24 hours) at 03/10/2019 1111 Last data filed at 03/10/2019 0400 Gross per 24 hour  Intake 1359.55 ml  Output 4 ml  Net 1355.55 ml   Filed Weights   03/06/19 2213 03/09/19 0134 03/10/19 0147  Weight: 52.2 kg 52.5 kg 52.4 kg    Examination:  Constitutional: NAD, calm, comfortable Eyes: PERRL, lids  and conjunctivae normal ENMT: Mucous membranes are moist. Posterior pharynx clear of any exudate or lesions.Normal dentition.  Neck: normal, supple, no masses, no thyromegaly Respiratory: clear to auscultation bilaterally, no wheezing, no crackles. Normal respiratory effort. No accessory muscle use.  Cardiovascular: Regular rate and rhythm, no murmurs / rubs / gallops. No extremity edema. 2+ pedal pulses. No carotid bruits.  Abdomen: no tenderness, no masses palpated. No hepatosplenomegaly. Bowel sounds positive.  Musculoskeletal: no clubbing / cyanosis. No joint deformity upper and lower extremities. Good ROM, no contractures. Normal muscle tone.  Skin: no rashes, lesions, ulcers. No induration Neurologic: CN 2-12 grossly intact.  Slurred speech but it appears to be more clearer today.  No other focal neurologic deficits Psychiatric: Normal judgment and insight. Alert and oriented x 3. Normal mood.   Data Reviewed:   CBC: Recent Labs  Lab 03/06/19 2133 03/06/19 2137 03/08/19 0334 03/09/19 0655 03/10/19 0449  WBC 6.5  --  6.6 5.4 4.5  NEUTROABS 3.9  --   --   --   --   HGB 14.2 14.6 13.3 12.2 11.9*  HCT 42.6 43.0 37.9 35.3* 34.3*  MCV 87.5  --  85.7 85.5 85.5  PLT 171  --  146* 126* [id number]*   Basic Metabolic Panel: Recent Labs  Lab 03/06/19 2132 03/06/19 2137 03/10/19 0449  NA 139 140 137  K 4.1 4.0 3.2*  CL 102 100 105  CO2 30  --  24  GLUCOSE 174* 168* 154*  BUN 13 14 7*  CREATININE 0.90 1.00 0.77  CALCIUM 9.0  --  8.3*  MG  --   --  1.8   GFR: Estimated Creatinine Clearance: 45.1 mL/min (by C-G formula based on SCr of 0.77 mg/dL). Liver Function Tests: Recent Labs  Lab 03/06/19 2132  AST 17  ALT 15  ALKPHOS 89  BILITOT 1.0  PROT 7.2  ALBUMIN 4.0   No results for input(s): LIPASE, AMYLASE in the last 168 hours. No results for input(s): AMMONIA in the last 168 hours. Coagulation Profile: Recent Labs  Lab 03/06/19 2132  INR 1.6*   Cardiac Enzymes: No  results for input(s): CKTOTAL, CKMB, CKMBINDEX, TROPONINI in the last 168 hours. BNP (last 3 results) No results for input(s): PROBNP in the last 8760 hours. HbA1C: Recent Labs    03/08/19 0334  HGBA1C 6.0*   CBG: Recent Labs  Lab 03/09/19 1608 03/09/19 1913 03/09/19 2330 03/10/19 0429 03/10/19 0733  GLUCAP 134* 147* 150* 149* 154*   Lipid Profile: Recent Labs    03/08/19 0334  CHOL 152  HDL 45  LDLCALC 93  TRIG 70  CHOLHDL 3.4   Thyroid Function Tests: No results for input(s): TSH, T4TOTAL, FREET4, T3FREE, THYROIDAB in the last 72 hours. Anemia Panel: No results for input(s): VITAMINB12, FOLATE, FERRITIN, TIBC, IRON, RETICCTPCT in the last 72 hours. Sepsis Labs: No results for input(s): PROCALCITON, LATICACIDVEN in the last 168 hours.  Recent Results (from the past 240 hour(s))  SARS Coronavirus 2 [facility] order, Performed in [facility] hospital lab) Nasopharyngeal Nasopharyngeal Swab     Status: None   Collection Time: 03/06/19  9:40 PM   Specimen: Nasopharyngeal Swab  Result Value Ref Range Status   SARS Coronavirus 2 NEGATIVE NEGATIVE Final    Comment: (NOTE) If result is NEGATIVE SARS-CoV-2 target nucleic acids are NOT DETECTED. The SARS-CoV-2 RNA is generally detectable in  upper and lower  respiratory specimens during the acute phase of infection. The lowest  concentration of SARS-CoV-2 viral copies this assay can detect is 250  copies / mL. A negative result does not preclude SARS-CoV-2 infection  and should not be used as the sole basis for treatment or other  patient management decisions.  A negative result may occur with  improper specimen collection / handling, submission of specimen other  than nasopharyngeal swab, presence of viral mutation(s) within the  areas targeted by this assay, and inadequate number of viral copies  (<250 copies / mL). A negative result must be combined with clinical  observations, patient history, and epidemiological  information. If result is POSITIVE SARS-CoV-2 target nucleic acids are DETECTED. The SARS-CoV-2 RNA is generally detectable in upper and lower  respiratory specimens dur ing the acute phase of infection.  Positive  results are indicative of active infection with SARS-CoV-2.  Clinical  correlation with patient history and other diagnostic information is  necessary to determine patient infection status.  Positive results do  not rule out bacterial infection or co-infection with other viruses. If result is PRESUMPTIVE POSTIVE SARS-CoV-2 nucleic acids MAY BE PRESENT.   A presumptive positive result was obtained on the submitted specimen  and confirmed on repeat testing.  While 2019 novel coronavirus  (SARS-CoV-2) nucleic acids may be present in the submitted sample  additional confirmatory testing may be necessary for epidemiological  and / or clinical management purposes  to differentiate between  SARS-CoV-2 and other Sarbecovirus currently known to infect humans.  If clinically indicated additional testing with an alternate test  methodology [phone]) is advised. The SARS-CoV-2 RNA is generally  detectable in upper and lower respiratory sp ecimens during the acute  phase of infection. The expected result is Negative. Fact Sheet for Patients:  StrictlyIdeas.no Fact Sheet for Healthcare Providers: BankingDealers.co.za This test is not yet approved or cleared by the [place] FDA and has been authorized for detection and/or diagnosis of SARS-CoV-2 by FDA under an Emergency Use Authorization (EUA).  This EUA will remain in effect (meaning this test can be used) for the duration of the COVID-19 declaration under Section 564(b)(1) of the Act, 21 U.S.C. section 360bbb-3(b)(1), unless the authorization is terminated or revoked sooner. Performed at [facility], [address] [place], [place] [address]          Radiology Studies: Ct  Head Wo Contrast  Result Date: 03/09/2019 CLINICAL DATA:  78 year old female with right MCA territory infarct. Follow-up study. EXAM: CT HEAD WITHOUT CONTRAST TECHNIQUE: Contiguous axial images were obtained from the base of the skull through the vertex without intravenous contrast. COMPARISON:  MRI dated 03/07/2019 and head CT dated 03/06/2019 FINDINGS: Brain: Interval development of an area of white matter hypodensity in the right frontal centrum semiovale in keeping with ischemic infarct. Small focal hypodensity in the left thalamus as well as old right cerebellar infarct again noted. No acute intracranial hemorrhage. No mass effect or midline shift. No extra-axial fluid collection. Vascular: No hyperdense vessel or unexpected calcification. Skull: Normal. Negative for fracture or focal lesion. Sinuses/Orbits: No acute finding. Other: None IMPRESSION: 1. No acute intracranial hemorrhage. 2. Interval development of an area of white matter hypodensity in the right frontal centrum semiovale in keeping with ischemic infarct. 3. Old right cerebellar and left thalamic infarcts. Electronically Signed   By: Anner Crete M.D.   On: 03/09/2019 19:49   Dg Swallowing Func-speech Pathology  Result Date: 03/10/2019 Objective Swallowing Evaluation: Type  of Study: MBS-Modified Barium Swallow Study  Patient Details Name: [name] MRN: [id number] Date of Birth: 09-18-1940 Today's Date: 03/10/2019 Time: SLP Start Time (ACUTE ONLY): 0908 -SLP Stop Time (ACUTE ONLY): 0938 SLP Time Calculation (min) (ACUTE ONLY): 30 min Past Medical History: Past Medical History: Diagnosis Date . Anemia 2005  Generally microcytic, transfusions in [address], 2012, 02/2015, 05/2015 . Aortic stenosis   Moderate November 2017 . Arthritis  . Broken back 2013  Chronic back pain.  [name] CAD (coronary artery disease)   Cardiac catheterization 2014 - 80% mid RCA and 70% OM managed medically . CHF (congestive heart failure) ([place])  . Chronic bilateral pleural  effusions  . Chronic headache  . Contrast media allergy  . Diastolic heart failure ([place])  . Esophageal cancer ([place]) 05/06/2015  Adenocarcinoma GE junction . Facial paresthesia  . GERD (gastroesophageal reflux disease)  . H. pylori infection  . H/O iron deficiency   05-06-15 iron infusion ([facility]) . HH (hiatus hernia) 2008  Large with associated erosions . Hypertension  . Paroxysmal atrial fibrillation ([name])  . PONV (postoperative nausea and vomiting)  . Pulmonary emboli ([place]) 2008, 2012 . Stroke [facility]) 1982 Past Surgical History: Past Surgical History: Procedure Laterality Date . APPENDECTOMY  1950s . BACK SURGERY   . CARPAL TUNNEL RELEASE Bilateral 1990s . [place] SURGERY  2014 . CHOLECYSTECTOMY  1980s  open . COLONOSCOPY N/A 02/17/2015  Procedure: COLONOSCOPY;  Surgeon: [name], MD;  Location: [facility] ENDOSCOPY;  Service: Endoscopy;  Laterality: N/A; . ESOPHAGOGASTRODUODENOSCOPY N/A 02/16/2015  Procedure: ESOPHAGOGASTRODUODENOSCOPY (EGD);  Surgeon: [name], MD;  Location: [facility];  Service: Endoscopy;  Laterality: N/A; . ESOPHAGOGASTRODUODENOSCOPY N/A 05/06/2015  Procedure: ESOPHAGOGASTRODUODENOSCOPY (EGD);  Surgeon: [name], MD;  Location: [facility] ENDOSCOPY;  Service: Endoscopy;  Laterality: N/A; . EUS N/A 05/20/2015  Procedure: UPPER ENDOSCOPIC ULTRASOUND (EUS) LINEAR;  Surgeon: [name], MD;  Location: [facility] ENDOSCOPY;  Service: Endoscopy;  Laterality: N/A; . exploratory lab  1950s or 1960s . GIVENS CAPSULE STUDY N/A 05/06/2015  Procedure: GIVENS CAPSULE STUDY;  Surgeon: [name], MD;  Location: [facility] ENDOSCOPY;  Service: Endoscopy;  Laterality: N/A; . KNEE ARTHROSCOPY WITH MEDIAL MENISECTOMY Left 04/18/2018  Procedure: LEFT KNEE ARTHROSCOPY WITH PARTIAL MEDIAL MENISECTOMY AND ANTERIOR CRUCIATE LIGAMENT DEBRIDEMENT;  Surgeon: [name], MD;  Location: [facility] ORS;  Service: Orthopedics;  Laterality: Left; . LEFT HEART CATH AND CORONARY ANGIOGRAPHY N/A 11/08/2016  Procedure: Left Heart Cath  and Coronary Angiography;  Surgeon: [name], MD;  Location: [facility] LAB;  Service: Cardiovascular;  Laterality: N/A; . lumbar back surgery  2012 . [place] SURGERY  1991 . TONSILLECTOMY   . TONSILLECTOMY AND ADENOIDECTOMY  1960s HPI: [name] is a 78 y.o. female with past medical history significant for chronic congestive heart failure, coronary artery disease, paroxysmal atrial fibrillation on Coumadin, PE, prior stroke, moderate aortic stenosis, hypertension presented to [facility] emergency department for sudden onset slurred speech as well as left facial droop. MRI reveals acute right M2 occlusion, consistent with the right MCA territory infarct and moderate atherosclerotic change elsewhere throughout the intracranial circulation. Pt with changes yesterday resuting in increased right facial numbness - thus CT showed Old right cerebellar and left thalamic infarcts and right frontal hypodensity c/w ischemic cva.  Subjective: pt awake in chair, states "I'm going to eat today" Assessment / Plan / Recommendation CHL IP CLINICAL IMPRESSIONS 03/10/2019 Clinical Impression Patient presents with moderate oropharyngeal dysphagia with sensorimotor deficits.  Decreased lingual strength/control results in lingual pumping and premature spillage of all boluses into pharynx/larynx before reflexive swallow triggered.  Thin via straw despite chin tuck is aspirated significantly with pt performing reflexive cough that did not clear aspirates. Decreasing bolus size to tsp was helpful to prevent aspiration.  Decreased tongue base retraction and laryngeal elevation allows vallecular residuals across consistencies due to poor epiglottic deflection, ? impact of curvature of anterior spine near cervical fusions. Pt does not sense residuals consistently.  Mastication ability with solids were severely impaired with pt swallowing only minimally masticated cracker.  Head turn left did not decrease residuals  but dry swallows effective.  Recommend start full liquid - nectar diet- with strict precautions.  As previously stated, suspect baseline dysphagia -possible impact of cervical fusions and prior strokes on her swallowing skills PTA.  Tsps of water between meals after oral care is recommended. Only thickened drinks with medications, all other intake.  Pt will benefit from skilled SLP to maximize her swallowing rehab, expectoration ability for pharyngeal clearance and cough strength for airway protection.  Using teach back with live monitor during study, educated pt to findings, precautions. SLP Visit Diagnosis Dysarthria and anarthria (R47.1);Dysphagia, oral phase (R13.11);Dysphagia, oropharyngeal phase (R13.12) Attention and concentration deficit following -- Frontal lobe and executive function deficit following -- Impact on safety and function Moderate aspiration risk;Risk for inadequate nutrition/hydration   CHL IP TREATMENT RECOMMENDATION 03/10/2019 Treatment Recommendations Therapy as outlined in treatment plan below   Prognosis 03/10/2019 Prognosis for Safe Diet Advancement Guarded Barriers to Reach Goals Time post onset;Severity of deficits Barriers/Prognosis Comment -- CHL IP DIET RECOMMENDATION 03/10/2019 SLP Diet Recommendations Nectar thick liquid Liquid Administration via Straw CHIN TUCK WITH EVERY BITE/SIP, INTERMITTENT DRY SWALLOWS, HOCK TO CLEAR PHARYNX, IF COUGHING, PT IS ASPIRATING --- STOP INTAKE Medication Administration Crushed with puree Compensations Slow rate;Small sips/bites;Chin tuck Postural Changes --   CHL IP OTHER RECOMMENDATIONS 03/10/2019 Recommended Consults -- Oral Care Recommendations Oral care BID Other Recommendations Have oral suction available   CHL IP FOLLOW UP RECOMMENDATIONS 03/10/2019 Follow up Recommendations Inpatient Rehab   CHL IP FREQUENCY AND DURATION 03/10/2019 Speech Therapy Frequency (ACUTE ONLY) min 2x/week Treatment Duration 2 weeks      CHL IP ORAL PHASE 03/10/2019 Oral Phase  Impaired Oral - Pudding Teaspoon -- Oral - Pudding Cup -- Oral - Honey Teaspoon -- Oral - Honey Cup -- Oral - Nectar Teaspoon Weak lingual manipulation;Reduced posterior propulsion;Lingual pumping;Premature spillage;Decreased bolus cohesion Oral - Nectar Cup Weak lingual manipulation;Lingual pumping;Reduced posterior propulsion;Premature spillage;Decreased bolus cohesion Oral - Nectar Straw Weak lingual manipulation;Lingual pumping;Premature spillage;Decreased bolus cohesion;Reduced posterior propulsion Oral - Thin Teaspoon Weak lingual manipulation;Lingual pumping;Premature spillage;Decreased bolus cohesion;Reduced posterior propulsion Oral - Thin Cup -- Oral - Thin Straw Lingual pumping;Weak lingual manipulation;Premature spillage Oral - Puree Reduced posterior propulsion;Weak lingual manipulation;Lingual pumping;Premature spillage;Lingual/palatal residue Oral - Mech Soft Weak lingual manipulation;Lingual pumping;Reduced posterior propulsion;Premature spillage;Impaired mastication;Lingual/palatal residue Oral - Regular -- Oral - Multi-Consistency -- Oral - Pill -- Oral Phase - Comment --  CHL IP PHARYNGEAL PHASE 03/10/2019 Pharyngeal Phase Impaired Pharyngeal- Pudding Teaspoon -- Pharyngeal -- Pharyngeal- Pudding Cup -- Pharyngeal -- Pharyngeal- Honey Teaspoon -- Pharyngeal -- Pharyngeal- Honey Cup -- Pharyngeal -- Pharyngeal- Nectar Teaspoon Reduced epiglottic inversion;Reduced pharyngeal peristalsis;Penetration/Aspiration before swallow;Reduced airway/laryngeal closure;Reduced laryngeal elevation;Reduced tongue base retraction;Pharyngeal residue - valleculae Pharyngeal Material enters airway, remains ABOVE vocal cords and not ejected out Pharyngeal- Nectar Cup -- Pharyngeal -- Pharyngeal- Nectar Straw Reduced pharyngeal peristalsis;Reduced epiglottic inversion;Reduced laryngeal elevation;Reduced airway/laryngeal closure;Penetration/Aspiration  before swallow;Reduced tongue base retraction;Pharyngeal residue -  valleculae Pharyngeal Material enters airway, CONTACTS cords and not ejected out Pharyngeal- Thin Teaspoon Reduced epiglottic inversion;Reduced airway/laryngeal closure;Reduced tongue base retraction;Penetration/Aspiration before swallow;Pharyngeal residue - valleculae Pharyngeal Material enters airway, remains ABOVE vocal cords and not ejected out Pharyngeal- Thin Cup -- Pharyngeal -- Pharyngeal- Thin Straw Reduced epiglottic inversion;Reduced pharyngeal peristalsis;Reduced laryngeal elevation;Reduced airway/laryngeal closure;Reduced tongue base retraction;Pharyngeal residue - valleculae;Moderate aspiration;Penetration/Aspiration before swallow Pharyngeal Material enters airway, passes BELOW cords and not ejected out despite cough attempt by patient Pharyngeal- Puree Reduced epiglottic inversion;Reduced pharyngeal peristalsis;Reduced anterior laryngeal mobility;Reduced laryngeal elevation;Reduced airway/laryngeal closure;Pharyngeal residue - valleculae;Reduced tongue base retraction Pharyngeal Material enters airway, remains ABOVE vocal cords then ejected out Pharyngeal- Mechanical Soft Reduced pharyngeal peristalsis;Reduced epiglottic inversion;Reduced laryngeal elevation;Reduced airway/laryngeal closure;Reduced tongue base retraction;Pharyngeal residue - valleculae Pharyngeal Material does not enter airway Pharyngeal- Regular -- Pharyngeal -- Pharyngeal- Multi-consistency -- Pharyngeal -- Pharyngeal- Pill -- Pharyngeal -- Pharyngeal Comment tight chin tuck posture effective to prevent aspiration with nectar, head turn left did not prevent vallecular residual, cued "hock" did not clear residuals from vallecular region, dry swallows helpful, cued cough was too weak to clear deep aspirates of thin, pt with silent laryngeal penetration to cord but audible aspiration with large amount of thin aspiration  CHL IP CERVICAL ESOPHAGEAL PHASE 03/10/2019 Cervical Esophageal Phase Impaired Pudding Teaspoon -- Pudding Cup --  Honey Teaspoon -- Honey Cup -- Nectar Teaspoon -- Nectar Cup -- Nectar Straw -- Thin Teaspoon -- Thin Cup -- Thin Straw -- Puree -- Mechanical Soft -- Regular -- Multi-consistency -- Pill -- Cervical Esophageal Comment -- [name] 03/10/2019, 10:36 AM  Luanna Salk, MS [facility] SLP Acute Rehab Services Pager [phone] Office [phone]                  Scheduled Meds: . atorvastatin  40 mg Oral q1800  . pantoprazole (PROTONIX) IV  40 mg Intravenous Q12H   Continuous Infusions: . sodium chloride Stopped (03/07/19 2139)  . sodium chloride 10 mL/hr at 03/08/19 1200  . dextrose 5 % and 0.45% NaCl 75 mL/hr at 03/10/19 0616  . feeding supplement (OSMOLITE 1.2 CAL)    . heparin 850 Units/hr (03/10/19 0037)  . potassium chloride 10 mEq (03/10/19 0958)     LOS: 3 days   Time spent= 25 mins    Ankit Arsenio Loader, MD [facility]  If 7PM-7AM, please contact night-coverage [email or web address] 03/10/2019, 11:11 AM

## 2019-03-10 NOTE — Progress Notes (Signed)
ANTICOAGULATION CONSULT NOTE  Pharmacy Consult for Heparin Indication: atrial fibrillation and stroke  Patient Measurements: Height: 5\' 2"  (157.5 cm) Weight: 115 lb 8.3 oz (52.4 kg) IBW/kg (Calculated) : 50.1 Heparin Dosing Weight: 52.5 kg  Vital Signs: Temp: 97.8 F (36.6 C) (09/07 1100) Temp Source: Oral (09/07 1100) BP: 158/56 (09/07 1150) Pulse Rate: 77 (09/07 1100)  Labs: Recent Labs    03/08/19 0334  03/09/19 0655 03/09/19 1511 03/10/19 0449 03/10/19 0933  HGB 13.3  --  12.2  --  11.9*  --   HCT 37.9  --  35.3*  --  34.3*  --   PLT 146*  --  126*  --  120*  --   HEPARINUNFRC 0.28*   < > 0.37 0.40 0.19* 0.28*  CREATININE  --   --   --   --  0.77  --    < > = values in this interval not displayed.    Estimated Creatinine Clearance: 45.1 mL/min (by C-G formula based on SCr of 0.77 mg/dL).   Assessment: [name] who was on chronic warfarin prior to admission presented to the ED with slurred speech. She was found to have an acute stroke and as SUB therapeutic INR at 1.6. Pharmacy was consulted to start IV Heparin.  Heparin level is slightly sub-therapeutic with confirmatory recheck.  RN reports that heparin is paused every time her SBP > 180.  Per first shift RN, heparin has not been paused since 0800 - BP has been controlled.  No bleeding reported.  Goal of Therapy:  Heparin level 0.3-0.5 units/ml Monitor platelets by anticoagulation protocol: Yes    Plan:  Continue heparin gtt at 850 units/hr Check 8 hr heparin level Daily heparin level and CBC  Elonzo Sopp D. Mina Marble, PharmD, BCPS, [place] 03/10/2019, 12:47 PM

## 2019-03-10 NOTE — Progress Notes (Signed)
Physical Therapy Treatment Patient Details Name: [name] MRN: [id number] DOB: [birth year] Today's Date: 03/10/2019    History of Present Illness Patient is a 78 year old female admitted from home with slurred speech and drooling.FOund to have R MCA infarct. PMH to include: afib, HTN, CHF, CAD.    PT Comments    Pt progressing towards goals. Practiced gait and stair navigation this session. Required min guard A for mobility tasks this session. Current recommendations appropriate. Will continue to follow acutely to maximize functional mobility independence and safety.     Follow Up Recommendations  Home health PT     Equipment Recommendations  None recommended by PT    Recommendations for Other Services       Precautions / Restrictions Precautions Precautions: Fall Restrictions Weight Bearing Restrictions: No    Mobility  Bed Mobility               General bed mobility comments: In chair upon entry   Transfers Overall transfer level: Needs assistance Equipment used: None Transfers: Sit to/from Stand Sit to Stand: Min guard         General transfer comment: Min guard for steadying assist.   Ambulation/Gait Ambulation/Gait assistance: Min guard;Supervision Gait Distance (Feet): 400 Feet Assistive device: 1 person hand held assist Gait Pattern/deviations: Step-through pattern;Decreased stride length;Drifts right/left Gait velocity: Decreased   General Gait Details: Pt with mild unsteadiness during gait. Pt holding grandaughter's hand during ambulation down the hall. Would drift from R to L during gait.    Stairs Stairs: Yes Stairs assistance: Min guard Stair Management: Two rails;Alternating pattern;Forwards Number of Stairs: 4 General stair comments: Pt with guarded stair navigation. Min guard for dafety. Educated pt's family about need for assist at home.    Wheelchair Mobility    Modified Rankin (Stroke Patients Only) Modified Rankin  (Stroke Patients Only) Pre-Morbid Rankin Score: No symptoms Modified Rankin: Moderately severe disability     Balance Overall balance assessment: Needs assistance Sitting-balance support: No upper extremity supported;Feet supported Sitting balance-Leahy Scale: Good     Standing balance support: During functional activity;No upper extremity supported;Single extremity supported Standing balance-Leahy Scale: Fair                              Cognition Arousal/Alertness: Awake/alert Behavior During Therapy: WFL for tasks assessed/performed Overall Cognitive Status: Difficult to assess                                        Exercises      General Comments General comments (skin integrity, edema, etc.): Pt's granddaughter present during session.       Pertinent Vitals/Pain Pain Assessment: No/denies pain Faces Pain Scale: Hurts a little bit Pain Location: neck posterior Pain Descriptors / Indicators: Aching    Home Living                      Prior Function            PT Goals (current goals can now be found in the care plan section) Acute Rehab PT Goals Patient Stated Goal: to go for a walk  PT Goal Formulation: With patient Time For Goal Achievement: 03/14/19 Potential to Achieve Goals: Fair Progress towards PT goals: Progressing toward goals    Frequency    Min 4X/week  PT Plan Current plan remains appropriate    Co-evaluation              AM-PAC PT "6 Clicks" Mobility   Outcome Measure  Help needed turning from your back to your side while in a flat bed without using bedrails?: A Little Help needed moving from lying on your back to sitting on the side of a flat bed without using bedrails?: A Little Help needed moving to and from a bed to a chair (including a wheelchair)?: A Little Help needed standing up from a chair using your arms (e.g., wheelchair or bedside chair)?: A Little Help needed to walk in  hospital room?: A Little Help needed climbing 3-5 steps with a railing? : A Little 6 Click Score: 18    End of Session Equipment Utilized During Treatment: Gait belt Activity Tolerance: Patient tolerated treatment well Patient left: in chair;with call bell/phone within reach;with chair alarm set;with family/visitor present Nurse Communication: Mobility status PT Visit Diagnosis: Muscle weakness (generalized) (M62.81);Unsteadiness on feet (R26.81);Other abnormalities of gait and mobility (R26.89)     Time: [id number] PT Time Calculation (min) (ACUTE ONLY): 18 min  Charges:  $Gait Training: 8-22 mins                     [name], PT, DPT  Acute Rehabilitation Services  Pager: [phone] Office: [phone]    [name] 03/10/2019, 12:26 PM

## 2019-03-11 LAB — BASIC METABOLIC PANEL
Anion gap: 5 (ref 5–15)
BUN: <5 mg/dL — ABNORMAL LOW (ref 8–23)
CO2: 22 mmol/L (ref 22–32)
Calcium: 8.5 mg/dL — ABNORMAL LOW (ref 8.9–10.3)
Chloride: 112 mmol/L — ABNORMAL HIGH (ref 98–111)
Creatinine, Ser: 0.72 mg/dL (ref 0.44–1.00)
GFR calc Af Amer: >60 mL/min (ref >60)
GFR calc non Af Amer: >60 mL/min (ref >60)
Glucose, Bld: 151 mg/dL — ABNORMAL HIGH (ref 70–99)
Potassium: 3.7 mmol/L (ref 3.5–5.1)
Sodium: 139 mmol/L (ref 135–145)

## 2019-03-11 LAB — CBC
HCT: 31.5 % — ABNORMAL LOW (ref 36.0–46.0)
Hemoglobin: 11 g/dL — ABNORMAL LOW (ref 12.0–15.0)
MCH: 29.7 pg (ref 26.0–34.0)
MCHC: 34.9 g/dL (ref 30.0–36.0)
MCV: 85.1 fL (ref 80.0–100.0)
Platelets: 114 10*3/uL — ABNORMAL LOW (ref 150–400)
RBC: 3.7 MIL/uL — ABNORMAL LOW (ref 3.87–5.11)
RDW: 12.8 % (ref 11.5–15.5)
WBC: 3.8 10*3/uL — ABNORMAL LOW (ref 4.0–10.5)
nRBC: 0 % (ref 0.0–0.2)

## 2019-03-11 LAB — GLUCOSE, CAPILLARY
Glucose-Capillary: 120 mg/dL — ABNORMAL HIGH (ref 70–99)
Glucose-Capillary: 152 mg/dL — ABNORMAL HIGH (ref 70–99)
Glucose-Capillary: 135 mg/dL — ABNORMAL HIGH (ref 70–99)
Glucose-Capillary: 150 mg/dL — ABNORMAL HIGH (ref 70–99)
Glucose-Capillary: 114 mg/dL — ABNORMAL HIGH (ref 70–99)
Glucose-Capillary: 109 mg/dL — ABNORMAL HIGH (ref 70–99)
Glucose-Capillary: 118 mg/dL — ABNORMAL HIGH (ref 70–99)

## 2019-03-11 LAB — MAGNESIUM: Magnesium: 2 mg/dL (ref 1.7–2.4)

## 2019-03-11 MED ORDER — LIDOCAINE 5 % EX PTCH
1.0000 | MEDICATED_PATCH | CUTANEOUS | Status: DC
  Administered 2019-03-11 – 2019-03-12 (×2): 1 via TRANSDERMAL
  Filled 2019-03-11 (×2): qty 1

## 2019-03-11 NOTE — Progress Notes (Signed)
Physical Therapy Treatment Patient Details Name: [name] MRN: [id number] DOB: 02-25-1941 Today's Date: 03/11/2019    History of Present Illness Patient is a 78 year old female admitted from home with slurred speech and drooling.FOund to have R MCA infarct. PMH to include: afib, HTN, CHF, CAD.    PT Comments    Patient progressing well towards PT goals. Tolerated gait training with Min guard assist for balance/safety. Used SPC for stability today as pt uses walking stick at home. Demonstrates some drifting but no overt LOB. Tolerated stair training with Min guard assist. Pt demonstrates good awareness of swallowing deficits/diet as breakfast arrived and pt stated she was not allowed to have grits. Will continue to follow for higher level balance.     Follow Up Recommendations  Home health PT     Equipment Recommendations  None recommended by PT    Recommendations for Other Services       Precautions / Restrictions Precautions Precautions: Fall Restrictions Weight Bearing Restrictions: No    Mobility  Bed Mobility               General bed mobility comments: oob in chair on arrival  Transfers Overall transfer level: Needs assistance Equipment used: Straight cane Transfers: Sit to/from Stand Sit to Stand: Min guard         General transfer comment: Min guard for steadying assist. Stood from chair x1, from toilet x1.  Ambulation/Gait Ambulation/Gait assistance: Min guard Gait Distance (Feet): 300 Feet Assistive device: Straight cane Gait Pattern/deviations: Step-through pattern;Decreased stride length;Drifts right/left Gait velocity: Decreased   General Gait Details: Slow, mildly unstteady gait with SPC for support; mild drifting and stumbled x1 but able to catch self.   Stairs Stairs: Yes Stairs assistance: Min guard Stair Management: Two rails;Alternating pattern;Forwards Number of Stairs: 4 General stair comments: Pt with guarded stair  navigation. Min guard for safety.   Wheelchair Mobility    Modified Rankin (Stroke Patients Only) Modified Rankin (Stroke Patients Only) Pre-Morbid Rankin Score: No symptoms Modified Rankin: Moderately severe disability     Balance Overall balance assessment: Needs assistance Sitting-balance support: No upper extremity supported;Feet supported Sitting balance-Leahy Scale: Good     Standing balance support: During functional activity Standing balance-Leahy Scale: Fair Standing balance comment: Able to stand at sink and wash hands without difficulty and perform pericare.                            Cognition Arousal/Alertness: Awake/alert Behavior During Therapy: WFL for tasks assessed/performed Overall Cognitive Status: Within Functional Limits for tasks assessed                                        Exercises      General Comments General comments (skin integrity, edema, etc.): HR stable today <100 bpm.      Pertinent Vitals/Pain Pain Assessment: Faces Faces Pain Scale: Hurts little more Pain Location: right flank/back Pain Descriptors / Indicators: Aching;Sore Pain Intervention(s): Monitored during session    Home Living                      Prior Function            PT Goals (current goals can now be found in the care plan section) Progress towards PT goals: Progressing toward goals    Frequency  Min 4X/week      PT Plan Current plan remains appropriate    Co-evaluation              AM-PAC PT "6 Clicks" Mobility   Outcome Measure  Help needed turning from your back to your side while in a flat bed without using bedrails?: A Little Help needed moving from lying on your back to sitting on the side of a flat bed without using bedrails?: A Little Help needed moving to and from a bed to a chair (including a wheelchair)?: A Little Help needed standing up from a chair using your arms (e.g., wheelchair or  bedside chair)?: A Little Help needed to walk in hospital room?: A Little Help needed climbing 3-5 steps with a railing? : A Little 6 Click Score: 18    End of Session Equipment Utilized During Treatment: Gait belt Activity Tolerance: Patient tolerated treatment well Patient left: in chair;with call bell/phone within reach;with chair alarm set Nurse Communication: Mobility status PT Visit Diagnosis: Muscle weakness (generalized) (M62.81);Unsteadiness on feet (R26.81);Other abnormalities of gait and mobility (R26.89)     Time: [id number] PT Time Calculation (min) (ACUTE ONLY): 20 min  Charges:  $Gait Training: 8-22 mins                     Wray Kearns, PT, DPT Acute Rehabilitation Services Pager [phone] Office [place] 03/11/2019, 10:22 AM

## 2019-03-11 NOTE — Progress Notes (Signed)
  Speech Language Pathology Treatment: Dysphagia  Patient Details Name: [name] MRN: [id number] DOB: 09-Feb-1941 Today's Date: 03/11/2019 Time: [id number] SLP Time Calculation (min) (ACUTE ONLY): 17 min  Assessment / Plan / Recommendation Clinical Impression  SLP second session as in discussion with MD, he is desiring dietary advancement if appropriate for pt.  SLP advised would consider to puree/nectar.  Therefore saw pt during second session to determine readiness, appropriatness for advancement especially in light of pt's phayrngeal retention.  Magic cup intake observed with pt, where she is consuming very small boluses with chin tuck posture.  Immediate throat clearing noted which may indicate laryngeal penetration to vocal folds, instructed her to clear throat strongly and reswallow to clear.    Of note, SlP also advised pt to discuss with her MD care plan for future potential severe dysphagia if she becomes medically ill given her level of current deficits.  Reviewed that tube feeding provides nutrition but does not prevent aspiration. Pt nodded her head "no" to indicate she would not want a feeding tube.  Again recommend she speak to her primary Md regarding this issue.        HPI HPI: [name] is a 78 y.o. female with past medical history significant for chronic congestive heart failure, coronary artery disease, paroxysmal atrial fibrillation on Coumadin, PE, prior stroke, moderate aortic stenosis, hypertension presented to [facility] emergency department for sudden onset slurred speech as well as left facial droop. MRI reveals acute right M2 occlusion, consistent with the right MCA territory infarct and moderate atherosclerotic change elsewhere throughout the intracranial circulation. Pt with changes yesterday resuting in increased right facial numbness - thus CT showed Old right cerebellar and left thalamic infarcts and right frontal hypodensity c/w ischemic cva.       SLP Plan  Continue with current plan of care       Recommendations  Diet recommendations: Nectar-thick liquid Liquids provided via: Cup Medication Administration: Crushed with puree Supervision: Patient able to self feed;Full supervision/cueing for compensatory strategies Compensations: Slow rate;Small sips/bites;Chin tuck(intermittent dry swallow, if coughing - cough, swallow rest) Postural Changes and/or Swallow Maneuvers: Seated upright 90 degrees;Upright 30-60 min after meal                Oral Care Recommendations: Oral care QID SLP Visit Diagnosis: Dysphagia, oropharyngeal phase (R13.12);Dysarthria and anarthria (R47.1) Plan: Continue with current plan of care       [place], Zacchaeus Halm Ann 03/11/2019, 9:25 AM Luanna Salk, MS [facility] SLP Acute Rehab Services Pager [phone] Office [phone]

## 2019-03-11 NOTE — Progress Notes (Signed)
  Speech Language Pathology Treatment: Dysphagia  Patient Details Name: RANESHA GRINDSTAFF MRN: [id number] DOB: 08/03/1940 Today's Date: 03/11/2019 Time: [id number] SLP Time Calculation (min) (ACUTE ONLY): 34 min  Assessment / Plan / Recommendation Clinical Impression  Pt today requiring few verbal cues to conduct chin tuck posture, delayed throat clearing and cough noted, when reflexive cough occurred during MBS, it was due to aspiration. Pt baseline dysphagia significantly exacerbated by her current stroke but at this time she appears to be managing po intake. SLP reviewed findings of MBS and importance of compensation strategies to mitigate risk.    Reviewed location of residuals in pharynx and that strengthening "hock" will help to clear vallecular residuals of which she is not aware.  Pt does admit that she coughed last night 3 times during her meal.  Reviewed need for pt to cough strongly to clear - reswallow and take rest break if pt coughing due to it indicating aspiration. Using teach back, pt educated but will need reinforcement.    Recognize that adequate po may be challenging for pt to receive and thus recommend to maximize liquid nutritional intake to also simultaneously diminish aspiration risk. Pt informed.        HPI HPI: TAYAH DANSER is a 78 y.o. female with past medical history significant for chronic congestive heart failure, coronary artery disease, paroxysmal atrial fibrillation on Coumadin, PE, prior stroke, moderate aortic stenosis, hypertension presented to [facility] emergency department for sudden onset slurred speech as well as left facial droop. MRI reveals acute right M2 occlusion, consistent with the right MCA territory infarct and moderate atherosclerotic change elsewhere throughout the intracranial circulation. Pt with changes yesterday resuting in increased right facial numbness - thus CT showed Old right cerebellar and left thalamic infarcts and right frontal  hypodensity c/w ischemic cva.      SLP Plan  Continue with current plan of care       Recommendations  Diet recommendations: Nectar-thick liquid Liquids provided via: Cup Medication Administration: Crushed with puree Supervision: Patient able to self feed;Full supervision/cueing for compensatory strategies([name] granddaughter providing supervision is adequate) Compensations: Slow rate;Small sips/bites;Chin tuck Postural Changes and/or Swallow Maneuvers: Upright 30-60 min after meal;Seated upright 90 degrees;Chin tuck("intermittent dry swallow", "hock" to clear pharynx of residuals - strengthen!)                Oral Care Recommendations: Oral care QID SLP Visit Diagnosis: Dysarthria and anarthria (R47.1);Dysphagia, oral phase (R13.11);Dysphagia, oropharyngeal phase (R13.12) Plan: Continue with current plan of care       [place], Mayrene Bastarache Ann 03/11/2019, 8:56 AM  Luanna Salk, MS [facility] SLP Acute Rehab Services Pager [phone] Office [phone]

## 2019-03-11 NOTE — Progress Notes (Signed)
PROGRESS NOTE    [name]  [id number] DOB: [birth year] DOA: 03/06/2019 PCP: Dorothyann Peng, NP   Brief Narrative:  78 year old with history of atrial fibrillation on Coumadin, CHF, prior CVA, aortic stenosis, pulmonary embolism, essential hypertension, CAD came to the hospital with difficulty speaking.  MRI of the brain revealed right MCA territory infarct with occlusion of M2.  Neurology team was consulted.  Seen by speech and swallow therapy.  Initially was having significant amount of dysphagia which has improved.  Her diet has been advanced to pured.   Assessment & Plan:   Principal Problem:   Stroke [facility]) Active Problems:   PAF (paroxysmal atrial fibrillation) ([place])   Long term current use of anticoagulant therapy   Chronic diastolic congestive heart failure ([name])   Mixed hyperlipidemia   Essential hypertension, benign   Subtherapeutic international normalized ratio (INR)  Acute right MCA CVA with slurred speech, improving Right-sided facial numbness, improved - Neurology team following.  Repeat CT head 9/6- no acute intracranial pathology -Echocardiogram 03/07/2019-ejection fraction 65% with normal wall motion, moderate aortic stenosis -MRI/MRA-right MCA infarct involving right frontal operculum, right M2 occlusion -Hemoglobin A1c 6.2, LDL 107 -Heparin stopped, transition to Eliquis. -PT/OT-home health - Speech swallow- showing signs of improvement.  We will transition from full liquid diet to pured diet.  Appreciate input from speech team.  Severe dysphagia secondary to acute CVA, improving - She is improving.  Doing fairly well with liquid diet therefore will change to pure.  If adequate intake, discontinue IV fluids  Chronic diastolic congestive heart failure, ejection fraction 65% -Appears to be euvolemic.  Essential hypertension, slightly elevated -Permissive hypertension.  Hyperlipidemia - LDL 93, needs to be on statin.    Coronary artery disease -  No chest pain.  History of pulmonary embolism -Now on Eliquis  DVT prophylaxis: Eliquis Code Status: Full Family Communication: None at bedside Disposition Plan: Maintain hospital stay to ensure she is having adequate oral intake  Consultants:   Neurology  Procedures:   None  Antimicrobials:   None   Subjective: Seen and examined at bedside.  Speech therapy at bedside as well.  Patient speech is much clearer today.  Swelling appears to be improving but having some coughing therefore requiring some cues.  She has been advised to eat slowly sit upright at 90 degrees and at least for 60 minutes after eating. She is very happy with her progress she has made and likely will not require any sort of advance feeding.  Review of Systems Otherwise negative except as per HPI, including: General = no fevers, chills, dizziness, malaise, fatigue HEENT/EYES = negative for pain, redness, loss of vision, double vision, blurred vision, loss of hearing, sore throat, hoarseness, dysphagia Cardiovascular= negative for chest pain, palpitation, murmurs, lower extremity swelling Respiratory/lungs= negative for shortness of breath, cough, hemoptysis, wheezing, mucus production Gastrointestinal= negative for nausea, vomiting,, abdominal pain, melena, hematemesis Genitourinary= negative for Dysuria, Hematuria, Change in Urinary Frequency MSK = Negative for arthralgia, myalgias, Back Pain, Joint swelling  Neurology= Negative for headache, seizures, numbness, tingling  Psychiatry= Negative for anxiety, depression, suicidal and homocidal ideation Allergy/Immunology= Medication/Food allergy as listed  Skin= Negative for Rash, lesions, ulcers, itching    Objective: Vitals:   03/10/19 2047 03/11/19 0100 03/11/19 0355 03/11/19 0701  BP: (!) 143/74 (!) 141/73 (!) 170/83 (!) 154/67  Pulse: 73 67 66 92  Resp: 18 17    Temp: 98.5 F (36.9 C) 98.2 F (36.8 C) 98 F (36.7 C) 97.6 F (  36.4 C)  TempSrc:  Oral Oral Oral Axillary  SpO2: 95% 98% 99% 99%  Weight:      Height:        Intake/Output Summary (Last 24 hours) at 03/11/2019 0940 Last data filed at 03/10/2019 1557 Gross per 24 hour  Intake 1268.5 ml  Output -  Net 1268.5 ml   Filed Weights   03/06/19 2213 03/09/19 0134 03/10/19 0147  Weight: 52.2 kg 52.5 kg 52.4 kg    Examination:  Constitutional: NAD, calm, comfortable, cachectic elderly frail.  Very pleasant Eyes: PERRL, lids and conjunctivae normal ENMT: Mucous membranes are moist. Posterior pharynx clear of any exudate or lesions.Normal dentition.  Neck: normal, supple, no masses, no thyromegaly Respiratory: clear to auscultation bilaterally, no wheezing, no crackles. Normal respiratory effort. No accessory muscle use.  Cardiovascular: Regular rate and rhythm, no murmurs / rubs / gallops. No extremity edema. 2+ pedal pulses. No carotid bruits.  Abdomen: no tenderness, no masses palpated. No hepatosplenomegaly. Bowel sounds positive.  Musculoskeletal: no clubbing / cyanosis. No joint deformity upper and lower extremities. Good ROM, no contractures. Normal muscle tone.  Skin: no rashes, lesions, ulcers. No induration Neurologic: CN 2-12 grossly intact.  Slurred speech but significantly improved since admission.  Strength 4+/5 in all extremities. Psychiatric: Normal judgment and insight. Alert and oriented x 3. Normal mood.    Data Reviewed:   CBC: Recent Labs  Lab 03/06/19 2133 03/06/19 2137 03/08/19 0334 03/09/19 0655 03/10/19 0449 03/11/19 0340  WBC 6.5  --  6.6 5.4 4.5 3.8*  NEUTROABS 3.9  --   --   --   --   --   HGB 14.2 14.6 13.3 12.2 11.9* 11.0*  HCT 42.6 43.0 37.9 35.3* 34.3* 31.5*  MCV 87.5  --  85.7 85.5 85.5 85.1  PLT 171  --  146* 126* 120* [id number]*   Basic Metabolic Panel: Recent Labs  Lab 03/06/19 2132 03/06/19 2137 03/10/19 0449 03/11/19 0340  NA 139 140 137 139  K 4.1 4.0 3.2* 3.7  CL 102 100 105 112*  CO2 30  --  24 22  GLUCOSE 174* 168*  154* 151*  BUN 13 14 7* <5*  CREATININE 0.90 1.00 0.77 0.72  CALCIUM 9.0  --  8.3* 8.5*  MG  --   --  1.8 2.0   GFR: Estimated Creatinine Clearance: 45.1 mL/min (by C-G formula based on SCr of 0.72 mg/dL). Liver Function Tests: Recent Labs  Lab 03/06/19 2132  AST 17  ALT 15  ALKPHOS 89  BILITOT 1.0  PROT 7.2  ALBUMIN 4.0   No results for input(s): LIPASE, AMYLASE in the last 168 hours. No results for input(s): AMMONIA in the last 168 hours. Coagulation Profile: Recent Labs  Lab 03/06/19 2132  INR 1.6*   Cardiac Enzymes: No results for input(s): CKTOTAL, CKMB, CKMBINDEX, TROPONINI in the last 168 hours. BNP (last 3 results) No results for input(s): PROBNP in the last 8760 hours. HbA1C: No results for input(s): HGBA1C in the last 72 hours. CBG: Recent Labs  Lab 03/10/19 1133 03/10/19 1558 03/10/19 2120 03/11/19 0625 03/11/19 0756  GLUCAP 132* 159* 125* 135* 150*   Lipid Profile: No results for input(s): CHOL, HDL, LDLCALC, TRIG, CHOLHDL, LDLDIRECT in the last 72 hours. Thyroid Function Tests: No results for input(s): TSH, T4TOTAL, FREET4, T3FREE, THYROIDAB in the last 72 hours. Anemia Panel: No results for input(s): VITAMINB12, FOLATE, FERRITIN, TIBC, IRON, RETICCTPCT in the last 72 hours. Sepsis Labs: No results for input(s): PROCALCITON,  LATICACIDVEN in the last 168 hours.  Recent Results (from the past 240 hour(s))  SARS Coronavirus 2 [facility] order, Performed in [facility] hospital lab) Nasopharyngeal Nasopharyngeal Swab     Status: None   Collection Time: 03/06/19  9:40 PM   Specimen: Nasopharyngeal Swab  Result Value Ref Range Status   SARS Coronavirus 2 NEGATIVE NEGATIVE Final    Comment: (NOTE) If result is NEGATIVE SARS-CoV-2 target nucleic acids are NOT DETECTED. The SARS-CoV-2 RNA is generally detectable in upper and lower  respiratory specimens during the acute phase of infection. The lowest  concentration of SARS-CoV-2 viral copies this  assay can detect is 250  copies / mL. A negative result does not preclude SARS-CoV-2 infection  and should not be used as the sole basis for treatment or other  patient management decisions.  A negative result may occur with  improper specimen collection / handling, submission of specimen other  than nasopharyngeal swab, presence of viral mutation(s) within the  areas targeted by this assay, and inadequate number of viral copies  (<250 copies / mL). A negative result must be combined with clinical  observations, patient history, and epidemiological information. If result is POSITIVE SARS-CoV-2 target nucleic acids are DETECTED. The SARS-CoV-2 RNA is generally detectable in upper and lower  respiratory specimens dur ing the acute phase of infection.  Positive  results are indicative of active infection with SARS-CoV-2.  Clinical  correlation with patient history and other diagnostic information is  necessary to determine patient infection status.  Positive results do  not rule out bacterial infection or co-infection with other viruses. If result is PRESUMPTIVE POSTIVE SARS-CoV-2 nucleic acids MAY BE PRESENT.   A presumptive positive result was obtained on the submitted specimen  and confirmed on repeat testing.  While 2019 novel coronavirus  (SARS-CoV-2) nucleic acids may be present in the submitted sample  additional confirmatory testing may be necessary for epidemiological  and / or clinical management purposes  to differentiate between  SARS-CoV-2 and other Sarbecovirus currently known to infect humans.  If clinically indicated additional testing with an alternate test  methodology [phone]) is advised. The SARS-CoV-2 RNA is generally  detectable in upper and lower respiratory sp ecimens during the acute  phase of infection. The expected result is Negative. Fact Sheet for Patients:  StrictlyIdeas.no Fact Sheet for Healthcare Providers:  BankingDealers.co.za This test is not yet approved or cleared by the [place] FDA and has been authorized for detection and/or diagnosis of SARS-CoV-2 by FDA under an Emergency Use Authorization (EUA).  This EUA will remain in effect (meaning this test can be used) for the duration of the COVID-19 declaration under Section 564(b)(1) of the Act, 21 U.S.C. section 360bbb-3(b)(1), unless the authorization is terminated or revoked sooner. Performed at [facility], [address], [place], [place] [address]          Radiology Studies: Ct Head Wo Contrast  Result Date: 03/09/2019 CLINICAL DATA:  78 year old female with right MCA territory infarct. Follow-up study. EXAM: CT HEAD WITHOUT CONTRAST TECHNIQUE: Contiguous axial images were obtained from the base of the skull through the vertex without intravenous contrast. COMPARISON:  MRI dated 03/07/2019 and head CT dated 03/06/2019 FINDINGS: Brain: Interval development of an area of white matter hypodensity in the right frontal centrum semiovale in keeping with ischemic infarct. Small focal hypodensity in the left thalamus as well as old right cerebellar infarct again noted. No acute intracranial hemorrhage. No mass effect or midline shift. No extra-axial fluid  collection. Vascular: No hyperdense vessel or unexpected calcification. Skull: Normal. Negative for fracture or focal lesion. Sinuses/Orbits: No acute finding. Other: None IMPRESSION: 1. No acute intracranial hemorrhage. 2. Interval development of an area of white matter hypodensity in the right frontal centrum semiovale in keeping with ischemic infarct. 3. Old right cerebellar and left thalamic infarcts. Electronically Signed   By: Anner Crete M.D.   On: 03/09/2019 19:49   Dg Swallowing Func-speech Pathology  Result Date: 03/10/2019 Objective Swallowing Evaluation: Type of Study: MBS-Modified Barium Swallow Study  Patient Details Name: [name] MRN:  [id number] Date of Birth: [birth year] Today's Date: 03/10/2019 Time: SLP Start Time (ACUTE ONLY): 0908 -SLP Stop Time (ACUTE ONLY): 0938 SLP Time Calculation (min) (ACUTE ONLY): 30 min Past Medical History: Past Medical History: Diagnosis Date . Anemia 2005  Generally microcytic, transfusions in [address], 2012, 02/2015, 05/2015 . Aortic stenosis   Moderate November 2017 . Arthritis  . Broken back 2013  Chronic back pain.  [name] CAD (coronary artery disease)   Cardiac catheterization 2014 - 80% mid RCA and 70% OM managed medically . CHF (congestive heart failure) ([place])  . Chronic bilateral pleural effusions  . Chronic headache  . Contrast media allergy  . Diastolic heart failure ([place])  . Esophageal cancer ([place]) 05/06/2015  Adenocarcinoma GE junction . Facial paresthesia  . GERD (gastroesophageal reflux disease)  . H. pylori infection  . H/O iron deficiency   05-06-15 iron infusion ([facility]) . HH (hiatus hernia) 2008  Large with associated erosions . Hypertension  . Paroxysmal atrial fibrillation ([name])  . PONV (postoperative nausea and vomiting)  . Pulmonary emboli ([place]) 2008, 2012 . Stroke [facility]) 1982 Past Surgical History: Past Surgical History: Procedure Laterality Date . APPENDECTOMY  1950s . BACK SURGERY   . CARPAL TUNNEL RELEASE Bilateral 1990s . [place] SURGERY  2014 . CHOLECYSTECTOMY  1980s  open . COLONOSCOPY N/A 02/17/2015  Procedure: COLONOSCOPY;  Surgeon: [name], MD;  Location: [facility] ENDOSCOPY;  Service: Endoscopy;  Laterality: N/A; . ESOPHAGOGASTRODUODENOSCOPY N/A 02/16/2015  Procedure: ESOPHAGOGASTRODUODENOSCOPY (EGD);  Surgeon: [name], MD;  Location: [facility] ENDOS[facility];  Service: Endoscopy;  Laterality: N/A; . ESOPHAGOGASTRODUODENOSCOPY N/A 05/06/2015  Procedure: ESOPHAGOGASTRODUODENOSCOPY (EGD);  Surgeon: [name], MD;  Location: [facility] ENDOSCOPY;  Service: Endoscopy;  Laterality: N/A; . EUS N/A 05/20/2015  Procedure: UPPER ENDOSCOPIC ULTRASOUND (EUS) LINEAR;  Surgeon: [name], MD;  Location: [facility]  ENDOSCOPY;  Service: Endoscopy;  Laterality: N/A; . exploratory lab  1950s or 1960s . GIVENS CAPSULE STUDY N/A 05/06/2015  Procedure: GIVENS CAPSULE STUDY;  Surgeon: [name], MD;  Location: [facility] ENDOSCOPY;  Service: Endoscopy;  Laterality: N/A; . KNEE ARTHROSCOPY WITH MEDIAL MENISECTOMY Left 04/18/2018  Procedure: LEFT KNEE ARTHROSCOPY WITH PARTIAL MEDIAL MENISECTOMY AND ANTERIOR CRUCIATE LIGAMENT DEBRIDEMENT;  Surgeon: [name], MD;  Location: [facility] ORS;  Service: Orthopedics;  Laterality: Left; . LEFT HEART CATH AND CORONARY ANGIOGRAPHY N/A 11/08/2016  Procedure: Left Heart Cath and Coronary Angiography;  Surgeon: [name], MD;  Location: [facility] CV LAB;  Service: Cardiovascular;  Laterality: N/A; . lumbar back surgery  2012 . [place] SURGERY  1991 . TONSILLECTOMY   . TONSILLECTOMY AND ADENOIDECTOMY  1960s HPI: LATRENA NICODEMUS is a 78 y.o. female with past medical history significant for chronic congestive heart failure, coronary artery disease, paroxysmal atrial fibrillation on Coumadin, PE, prior stroke, moderate aortic stenosis, hypertension presented to [facility] emergency department for sudden onset slurred speech as well as left  facial droop. MRI reveals acute right M2 occlusion, consistent with the right MCA territory infarct and moderate atherosclerotic change elsewhere throughout the intracranial circulation. Pt with changes yesterday resuting in increased right facial numbness - thus CT showed Old right cerebellar and left thalamic infarcts and right frontal hypodensity c/w ischemic cva.  Subjective: pt awake in chair, states "I'm going to eat today" Assessment / Plan / Recommendation CHL IP CLINICAL IMPRESSIONS 03/10/2019 Clinical Impression Patient presents with moderate oropharyngeal dysphagia with sensorimotor deficits.   Decreased lingual strength/control results in lingual pumping and premature spillage of all boluses into pharynx/larynx before reflexive swallow  triggered.  Thin via straw despite chin tuck is aspirated significantly with pt performing reflexive cough that did not clear aspirates. Decreasing bolus size to tsp was helpful to prevent aspiration.  Decreased tongue base retraction and laryngeal elevation allows vallecular residuals across consistencies due to poor epiglottic deflection, ? impact of curvature of anterior spine near cervical fusions. Pt does not sense residuals consistently.  Mastication ability with solids were severely impaired with pt swallowing only minimally masticated cracker.  Head turn left did not decrease residuals but dry swallows effective.  Recommend start full liquid - nectar diet- with strict precautions.  As previously stated, suspect baseline dysphagia -possible impact of cervical fusions and prior strokes on her swallowing skills PTA.  Tsps of water between meals after oral care is recommended. Only thickened drinks with medications, all other intake.  Pt will benefit from skilled SLP to maximize her swallowing rehab, expectoration ability for pharyngeal clearance and cough strength for airway protection.  Using teach back with live monitor during study, educated pt to findings, precautions. SLP Visit Diagnosis Dysarthria and anarthria (R47.1);Dysphagia, oral phase (R13.11);Dysphagia, oropharyngeal phase (R13.12) Attention and concentration deficit following -- Frontal lobe and executive function deficit following -- Impact on safety and function Moderate aspiration risk;Risk for inadequate nutrition/hydration   CHL IP TREATMENT RECOMMENDATION 03/10/2019 Treatment Recommendations Therapy as outlined in treatment plan below   Prognosis 03/10/2019 Prognosis for Safe Diet Advancement Guarded Barriers to Reach Goals Time post onset;Severity of deficits Barriers/Prognosis Comment -- CHL IP DIET RECOMMENDATION 03/10/2019 SLP Diet Recommendations Nectar thick liquid Liquid Administration via Straw CHIN TUCK WITH EVERY BITE/SIP, INTERMITTENT  DRY SWALLOWS, HOCK TO CLEAR PHARYNX, IF COUGHING, PT IS ASPIRATING --- STOP INTAKE Medication Administration Crushed with puree Compensations Slow rate;Small sips/bites;Chin tuck Postural Changes --   CHL IP OTHER RECOMMENDATIONS 03/10/2019 Recommended Consults -- Oral Care Recommendations Oral care BID Other Recommendations Have oral suction available   CHL IP FOLLOW UP RECOMMENDATIONS 03/10/2019 Follow up Recommendations Inpatient Rehab   CHL IP FREQUENCY AND DURATION 03/10/2019 Speech Therapy Frequency (ACUTE ONLY) min 2x/week Treatment Duration 2 weeks      CHL IP ORAL PHASE 03/10/2019 Oral Phase Impaired Oral - Pudding Teaspoon -- Oral - Pudding Cup -- Oral - Honey Teaspoon -- Oral - Honey Cup -- Oral - Nectar Teaspoon Weak lingual manipulation;Reduced posterior propulsion;Lingual pumping;Premature spillage;Decreased bolus cohesion Oral - Nectar Cup Weak lingual manipulation;Lingual pumping;Reduced posterior propulsion;Premature spillage;Decreased bolus cohesion Oral - Nectar Straw Weak lingual manipulation;Lingual pumping;Premature spillage;Decreased bolus cohesion;Reduced posterior propulsion Oral - Thin Teaspoon Weak lingual manipulation;Lingual pumping;Premature spillage;Decreased bolus cohesion;Reduced posterior propulsion Oral - Thin Cup -- Oral - Thin Straw Lingual pumping;Weak lingual manipulation;Premature spillage Oral - Puree Reduced posterior propulsion;Weak lingual manipulation;Lingual pumping;Premature spillage;Lingual/palatal residue Oral - Mech Soft Weak lingual manipulation;Lingual pumping;Reduced posterior propulsion;Premature spillage;Impaired mastication;Lingual/palatal residue Oral - Regular -- Oral - Multi-Consistency -- Oral - Pill -- Oral  Phase - Comment --  CHL IP PHARYNGEAL PHASE 03/10/2019 Pharyngeal Phase Impaired Pharyngeal- Pudding Teaspoon -- Pharyngeal -- Pharyngeal- Pudding Cup -- Pharyngeal -- Pharyngeal- Honey Teaspoon -- Pharyngeal -- Pharyngeal- Honey Cup -- Pharyngeal -- Pharyngeal-  Nectar Teaspoon Reduced epiglottic inversion;Reduced pharyngeal peristalsis;Penetration/Aspiration before swallow;Reduced airway/laryngeal closure;Reduced laryngeal elevation;Reduced tongue base retraction;Pharyngeal residue - valleculae Pharyngeal Material enters airway, remains ABOVE vocal cords and not ejected out Pharyngeal- Nectar Cup -- Pharyngeal -- Pharyngeal- Nectar Straw Reduced pharyngeal peristalsis;Reduced epiglottic inversion;Reduced laryngeal elevation;Reduced airway/laryngeal closure;Penetration/Aspiration before swallow;Reduced tongue base retraction;Pharyngeal residue - valleculae Pharyngeal Material enters airway, CONTACTS cords and not ejected out Pharyngeal- Thin Teaspoon Reduced epiglottic inversion;Reduced airway/laryngeal closure;Reduced tongue base retraction;Penetration/Aspiration before swallow;Pharyngeal residue - valleculae Pharyngeal Material enters airway, remains ABOVE vocal cords and not ejected out Pharyngeal- Thin Cup -- Pharyngeal -- Pharyngeal- Thin Straw Reduced epiglottic inversion;Reduced pharyngeal peristalsis;Reduced laryngeal elevation;Reduced airway/laryngeal closure;Reduced tongue base retraction;Pharyngeal residue - valleculae;Moderate aspiration;Penetration/Aspiration before swallow Pharyngeal Material enters airway, passes BELOW cords and not ejected out despite cough attempt by patient Pharyngeal- Puree Reduced epiglottic inversion;Reduced pharyngeal peristalsis;Reduced anterior laryngeal mobility;Reduced laryngeal elevation;Reduced airway/laryngeal closure;Pharyngeal residue - valleculae;Reduced tongue base retraction Pharyngeal Material enters airway, remains ABOVE vocal cords then ejected out Pharyngeal- Mechanical Soft Reduced pharyngeal peristalsis;Reduced epiglottic inversion;Reduced laryngeal elevation;Reduced airway/laryngeal closure;Reduced tongue base retraction;Pharyngeal residue - valleculae Pharyngeal Material does not enter airway Pharyngeal- Regular --  Pharyngeal -- Pharyngeal- Multi-consistency -- Pharyngeal -- Pharyngeal- Pill -- Pharyngeal -- Pharyngeal Comment tight chin tuck posture effective to prevent aspiration with nectar, head turn left did not prevent vallecular residual, cued "hock" did not clear residuals from vallecular region, dry swallows helpful, cued cough was too weak to clear deep aspirates of thin, pt with silent laryngeal penetration to cord but audible aspiration with large amount of thin aspiration  CHL IP CERVICAL ESOPHAGEAL PHASE 03/10/2019 Cervical Esophageal Phase Impaired Pudding Teaspoon -- Pudding Cup -- Honey Teaspoon -- Honey Cup -- Nectar Teaspoon -- Nectar Cup -- Nectar Straw -- Thin Teaspoon -- Thin Cup -- Thin Straw -- Puree -- Mechanical Soft -- Regular -- Multi-consistency -- Pill -- Cervical Esophageal Comment -- [name] 03/10/2019, 10:36 AM  Luanna Salk, MS [facility] SLP Acute Rehab Services Pager [phone] Office [phone]                  Scheduled Meds: . amLODipine  5 mg Oral Daily  . apixaban  5 mg Oral BID  . aspirin EC  81 mg Oral Daily  . atorvastatin  40 mg Oral q1800  . carvedilol  25 mg Oral BID WC  . lidocaine  1 patch Transdermal Q24H  . pantoprazole  40 mg Oral BID   Continuous Infusions: . sodium chloride 10 mL/hr at 03/08/19 1200  . feeding supplement (OSMOLITE 1.2 CAL)       LOS: 4 days   Time spent= 25 mins    Ankit Arsenio Loader, MD [facility]  If 7PM-7AM, please contact night-coverage [email or web address] 03/11/2019, 9:40 AM

## 2019-03-11 NOTE — Progress Notes (Signed)
Occupational Therapy Treatment Patient Details Name: [name] MRN: [id number] DOB: [birth year] Today's Date: 03/11/2019    History of present illness Patient is a 78 year old female admitted from home with slurred speech and drooling.FOund to have R MCA infarct. PMH to include: afib, HTN, CHF, CAD.   OT comments  Pt and granddaughter given tips for suction with baby bulb that you could use for a nose for secretions if needed, educated on working on L side scanning and looking to see if patient is consistently missing objects in the same visual area. Noted that all objects were on R side of the tray nothing on L side. Granddaughter states "I have not noticed this until now" Pt is adequately level to dc home with family.    Follow Up Recommendations  Home health OT    Equipment Recommendations  None recommended by OT    Recommendations for Other Services      Precautions / Restrictions Precautions Precautions: Fall       Mobility Bed Mobility                  Transfers Overall transfer level: Modified independent               General transfer comment: gathering trash in the room and organizing things. pt with stable HR 85-99 this session    Balance                                           ADL either performed or assessed with clinical judgement   ADL                                         General ADL Comments: pt demonstrates swallowing strategy several times during sesison adn granddaughter alex is great at helping reinforce. pt demonstrated continued L inattention. pt with notebook full of information but doesnt have a clear purpose. alex reports she has lots of these. Pt able to read correctly aloud and following SLP signage in room. pt however still prefers R gaze and R head turn     Vision       Perception     Praxis      Cognition Arousal/Alertness: Awake/alert Behavior During Therapy: WFL for tasks  assessed/performed Overall Cognitive Status: Within Functional Limits for tasks assessed                                          Exercises     Shoulder Instructions       General Comments      Pertinent Vitals/ Pain       Pain Assessment: No/denies pain  Home Living                                          Prior Functioning/Environment              Frequency  Min 3X/week        Progress Toward Goals  OT Goals(current goals can now be found in the care plan section)  Progress towards  OT goals: Progressing toward goals  Acute Rehab OT Goals Patient Stated Goal: to drink more of my smoothie OT Goal Formulation: With patient Time For Goal Achievement: 03/21/19 Potential to Achieve Goals: Good ADL Goals Pt Will Transfer to Toilet: with supervision;regular height toilet Additional ADL Goal #1: pt will complete L UE HEP program MOD I for strengthening Additional ADL Goal #2: pt will locate 3 objects in L visual field during adl task mod i  Plan Discharge plan remains appropriate    Co-evaluation                 AM-PAC OT "6 Clicks" Daily Activity     Outcome Measure   Help from another person eating meals?: None Help from another person taking care of personal grooming?: None Help from another person toileting, which includes using toliet, bedpan, or urinal?: None Help from another person bathing (including washing, rinsing, drying)?: None Help from another person to put on and taking off regular upper body clothing?: None Help from another person to put on and taking off regular lower body clothing?: None 6 Click Score: 24    End of Session    OT Visit Diagnosis: Unsteadiness on feet (R26.81);Muscle weakness (generalized) (M62.81)   Activity Tolerance Patient tolerated treatment well   Patient Left in chair;with call bell/phone within reach;with chair alarm set;with family/visitor present   Nurse Communication  Mobility status;Precautions        Time: [id number] OT Time Calculation (min): 23 min  Charges: OT General Charges $OT Visit: 1 Visit OT Treatments $Self Care/Home Management : 23-37 mins   Jeri Modena, OTR/L  Acute Rehabilitation Services Pager: [phone] Office: [phone] .    Jeri Modena 03/11/2019, 3:10 PM

## 2019-03-12 LAB — CBC
HCT: 32.3 % — ABNORMAL LOW (ref 36.0–46.0)
Hemoglobin: 11.1 g/dL — ABNORMAL LOW (ref 12.0–15.0)
MCH: 29.7 pg (ref 26.0–34.0)
MCHC: 34.4 g/dL (ref 30.0–36.0)
MCV: 86.4 fL (ref 80.0–100.0)
Platelets: 115 10*3/uL — ABNORMAL LOW (ref 150–400)
RBC: 3.74 MIL/uL — ABNORMAL LOW (ref 3.87–5.11)
RDW: 12.6 % (ref 11.5–15.5)
WBC: 4.3 10*3/uL (ref 4.0–10.5)
nRBC: 0 % (ref 0.0–0.2)

## 2019-03-12 LAB — BASIC METABOLIC PANEL
Anion gap: 8 (ref 5–15)
BUN: 6 mg/dL — ABNORMAL LOW (ref 8–23)
CO2: 24 mmol/L (ref 22–32)
Calcium: 8.4 mg/dL — ABNORMAL LOW (ref 8.9–10.3)
Chloride: 105 mmol/L (ref 98–111)
Creatinine, Ser: 0.81 mg/dL (ref 0.44–1.00)
GFR calc Af Amer: >60 mL/min (ref >60)
GFR calc non Af Amer: >60 mL/min (ref >60)
Glucose, Bld: 124 mg/dL — ABNORMAL HIGH (ref 70–99)
Potassium: 3.3 mmol/L — ABNORMAL LOW (ref 3.5–5.1)
Sodium: 137 mmol/L (ref 135–145)

## 2019-03-12 LAB — GLUCOSE, CAPILLARY
Glucose-Capillary: 126 mg/dL — ABNORMAL HIGH (ref 70–99)
Glucose-Capillary: 137 mg/dL — ABNORMAL HIGH (ref 70–99)
Glucose-Capillary: 109 mg/dL — ABNORMAL HIGH (ref 70–99)

## 2019-03-12 LAB — MAGNESIUM: Magnesium: 1.9 mg/dL (ref 1.7–2.4)

## 2019-03-12 MED ORDER — ATORVASTATIN CALCIUM 40 MG PO TABS: 40.0000 mg | ORAL_TABLET | Freq: Every day | ORAL | 0 refills | Status: DC

## 2019-03-12 MED ORDER — APIXABAN 5 MG PO TABS: 5.0000 mg | ORAL_TABLET | Freq: Two times a day (BID) | ORAL | 2 refills | Status: DC

## 2019-03-12 MED FILL — ELIQUIS 5 MG TABLET: 5 | 30 days supply | Qty: 60 | Fill #0

## 2019-03-12 NOTE — Progress Notes (Signed)
Physical Therapy Treatment Patient Details Name: [name] MRN: [id number] DOB: April 24, 1941 Today's Date: 03/12/2019    History of Present Illness Patient is a 78 year old female admitted from home with slurred speech and drooling.FOund to have R MCA infarct. PMH to include: afib, HTN, CHF, CAD.    PT Comments    Pt progressing well. Attempted to amb without AD however pt kept reaching for hallway rail. Pt given cane an demonstrated more stability and reported feeling more comfortable. PT remains at increased falls risk as demonstrated by score of 16 on DGI. Acute PT to cont to follow.   Follow Up Recommendations  Home health PT;Supervision/Assistance - 24 hour     Equipment Recommendations  None recommended by PT    Recommendations for Other Services       Precautions / Restrictions Precautions Precautions: Fall Restrictions Weight Bearing Restrictions: No    Mobility  Bed Mobility               General bed mobility comments: pt sitting up in chair upon PT arrival  Transfers Overall transfer level: Needs assistance   Transfers: Sit to/from Stand Sit to Stand: Min guard         General transfer comment: pt with good technique and steady  Ambulation/Gait Ambulation/Gait assistance: Min guard Gait Distance (Feet): 200 Feet Assistive device: Straight cane Gait Pattern/deviations: Step-through pattern;Decreased stride length;Drifts right/left Gait velocity: Decreased   General Gait Details: Slow, mildly unstteady gait with SPC for support; mild drifting and stumbled x1 but able to catch self.   Stairs Stairs: Yes Stairs assistance: Min guard Stair Management: Two rails;Alternating pattern;Forwards Number of Stairs: 4 General stair comments: pt utilized HR, steady, no physical assist   Wheelchair Mobility    Modified Rankin (Stroke Patients Only) Modified Rankin (Stroke Patients Only) Pre-Morbid Rankin Score: No symptoms Modified Rankin: Slight  disability     Balance Overall balance assessment: Needs assistance Sitting-balance support: No upper extremity supported;Feet supported Sitting balance-Leahy Scale: Good                           Standardized Balance Assessment Standardized Balance Assessment : Dynamic Gait Index   Dynamic Gait Index Level Surface: Mild Impairment Change in Gait Speed: Mild Impairment Gait with Horizontal Head Turns: Mild Impairment Gait with Vertical Head Turns: Mild Impairment Gait and Pivot Turn: Mild Impairment Step Over Obstacle: Mild Impairment Step Around Obstacles: Mild Impairment Steps: Mild Impairment Total Score: 16      Cognition Arousal/Alertness: Awake/alert Behavior During Therapy: WFL for tasks assessed/performed Overall Cognitive Status: Within Functional Limits for tasks assessed                                        Exercises      General Comments General comments (skin integrity, edema, etc.): pt observed eating and did very well tucking her chin      Pertinent Vitals/Pain Pain Assessment: No/denies pain    Home Living                      Prior Function            PT Goals (current goals can now be found in the care plan section) Progress towards PT goals: Progressing toward goals    Frequency    Min 4X/week  PT Plan Current plan remains appropriate    Co-evaluation              AM-PAC PT "6 Clicks" Mobility   Outcome Measure  Help needed turning from your back to your side while in a flat bed without using bedrails?: None Help needed moving from lying on your back to sitting on the side of a flat bed without using bedrails?: None Help needed moving to and from a bed to a chair (including a wheelchair)?: A Little Help needed standing up from a chair using your arms (e.g., wheelchair or bedside chair)?: A Little Help needed to walk in hospital room?: A Little Help needed climbing 3-5 steps with a  railing? : A Little 6 Click Score: 20    End of Session Equipment Utilized During Treatment: Gait belt Activity Tolerance: Patient tolerated treatment well Patient left: in chair;with call bell/phone within reach;with chair alarm set Nurse Communication: Mobility status PT Visit Diagnosis: Muscle weakness (generalized) (M62.81);Unsteadiness on feet (R26.81);Other abnormalities of gait and mobility (R26.89)     Time: [id number] PT Time Calculation (min) (ACUTE ONLY): 23 min  Charges:  $Gait Training: 23-37 mins                     Kittie Plater, PT, DPT Acute Rehabilitation Services Pager #: [phone] Office #: [phone]    Berline Lopes 03/12/2019, 12:59 PM

## 2019-03-12 NOTE — Progress Notes (Signed)
PROGRESS NOTE    [name]  [id number] DOB: 02/24/1941 DOA: 03/06/2019 PCP: Dorothyann Peng, NP    Brief Narrative:     Prior history of atrial fibrillation on Coumadin, CHF, CVA, pulmonary embolism, hypertension, coronary artery disease, aortic stenosis was brought into the hospital for difficulty speaking.  MRI of the brain revealed right MCA infarct with occlusion of M2.  Neurology on board and recommended transition Coumadin to NOAC on discharge.  Patient had significant dysphagia on admission which has improved and she is currently on dysphagia 1 diet. Once she is able to tolerate dysphagia diet we will be able to discharge her home with home PT.   Assessment & Plan:   Principal Problem:   Stroke [facility]) Active Problems:   PAF (paroxysmal atrial fibrillation) ([place])   Long term current use of anticoagulant therapy   Chronic diastolic congestive heart failure ([name])   Mixed hyperlipidemia   Essential hypertension, benign   Subtherapeutic international normalized ratio (INR)   Acute right MCA infarct With some speech impairment, dysphagia and right-sided facial numbness which have improved. MRI of the brain and MRA of the head and neck showed right MCA infarct involving right frontal operculum, with the right M2 occlusion.  Neurology on board recommended to continue with Eliquis on discharge. Echocardiogram this admission showed preserved left ventricular ejection fraction with normal wall motion and moderate aortic stenosis. Hemoglobin A1c 6.2 LDL is 107 Therapy eval's recommending home health PT and OT on discharge. SLP evaluation recommended transition to dysphagia diet with nectar thick liquids and if able to tolerate we will plan for discharge in the next 24 hours.   Severe dysphagia from acute CVA Improving If able to tolerate dysphagia 1 diet with nectar thick liquids will plan for discharge in the next 24 hours.   Chronic diastolic heart failure Patient  appears euvolemic and compensated.    Paroxysmal atrial fibrillation Rate control with Coreg and patient is on Eliquis.   Essential hypertension Well-controlled blood pressure parameters.   GERD Continue with PPI   Hyperlipidemia  Goal LDL less than 70 continue with Lipitor 40 mg daily.  DVT prophylaxis: Eliquis Code Status: Full code Family Communication: Called her son son and left a message. Disposition Plan: If able to tolerate dysphagia 1 diet without any issues will plan for discharge home in the next 24 hours.  Consultants:   Neurology  Procedures: MRI of the brain MRA of the head and neck Antimicrobials: None  Subjective: Patient denies any chest pain, shortness of breath, nausea, vomiting or abdominal pain.  She was sitting in her chair and eating her breakfast on exam today  Objective: Vitals:   03/11/19 1948 03/11/19 2344 03/12/19 0733 03/12/19 1237  BP: (!) 158/67 (!) 186/77 (!) 168/73 137/70  Pulse: 84 79 71 81  Resp: 17 16 18 18   Temp: 98.5 F (36.9 C) 98.5 F (36.9 C) 98.4 F (36.9 C) 98.4 F (36.9 C)  TempSrc: Oral Oral Oral Oral  SpO2: 97% 94% 98% 95%  Weight:      Height:        Intake/Output Summary (Last 24 hours) at 03/12/2019 1255 Last data filed at 03/12/2019 0900 Gross per 24 hour  Intake 60 ml  Output --  Net 60 ml   Filed Weights   03/06/19 2213 03/09/19 0134 03/10/19 0147  Weight: 52.2 kg 52.5 kg 52.4 kg    Examination:  General exam: Appears calm and comfortable  Respiratory system: Clear to auscultation. Respiratory effort  normal. Cardiovascular system: S1 & S2 heard, regular rate rhythm, no JVD  gastrointestinal system: Abdomen is nondistended, soft and nontender. No organomegaly or masses felt. Normal bowel sounds heard. Central nervous system: Alert and oriented to person and place and able to answer questions appropriately Extremities: No pedal edema, cyanosis Skin: No rashes, lesions or ulcers Psychiatry:  Mood &  affect appropriate.     Data Reviewed: I have personally reviewed following labs and imaging studies  CBC: Recent Labs  Lab 03/06/19 2133  03/08/19 0334 03/09/19 0655 03/10/19 0449 03/11/19 0340 03/12/19 0306  WBC 6.5  --  6.6 5.4 4.5 3.8* 4.3  NEUTROABS 3.9  --   --   --   --   --   --   HGB 14.2   < > 13.3 12.2 11.9* 11.0* 11.1*  HCT 42.6   < > 37.9 35.3* 34.3* 31.5* 32.3*  MCV 87.5  --  85.7 85.5 85.5 85.1 86.4  PLT 171  --  146* 126* 120* 114* 115*   < > = values in this interval not displayed.   Basic Metabolic Panel: Recent Labs  Lab 03/06/19 2132 03/06/19 2137 03/10/19 0449 03/11/19 0340 03/12/19 0306  NA 139 140 137 139 137  K 4.1 4.0 3.2* 3.7 3.3*  CL 102 100 105 112* 105  CO2 30  --  24 22 24   GLUCOSE 174* 168* 154* 151* 124*  BUN 13 14 7* <5* 6*  CREATININE 0.90 1.00 0.77 0.72 0.81  CALCIUM 9.0  --  8.3* 8.5* 8.4*  MG  --   --  1.8 2.0 1.9   GFR: Estimated Creatinine Clearance: 44.5 mL/min (by C-G formula based on SCr of 0.81 mg/dL). Liver Function Tests: Recent Labs  Lab 03/06/19 2132  AST 17  ALT 15  ALKPHOS 89  BILITOT 1.0  PROT 7.2  ALBUMIN 4.0   No results for input(s): LIPASE, AMYLASE in the last 168 hours. No results for input(s): AMMONIA in the last 168 hours. Coagulation Profile: Recent Labs  Lab 03/06/19 2132  INR 1.6*   Cardiac Enzymes: No results for input(s): CKTOTAL, CKMB, CKMBINDEX, TROPONINI in the last 168 hours. BNP (last 3 results) No results for input(s): PROBNP in the last 8760 hours. HbA1C: No results for input(s): HGBA1C in the last 72 hours. CBG: Recent Labs  Lab 03/11/19 1134 03/11/19 1741 03/11/19 2115 03/12/19 0603 03/12/19 1129  GLUCAP 114* 109* 118* 126* 137*   Lipid Profile: No results for input(s): CHOL, HDL, LDLCALC, TRIG, CHOLHDL, LDLDIRECT in the last 72 hours. Thyroid Function Tests: No results for input(s): TSH, T4TOTAL, FREET4, T3FREE, THYROIDAB in the last 72 hours. Anemia Panel: No  results for input(s): VITAMINB12, FOLATE, FERRITIN, TIBC, IRON, RETICCTPCT in the last 72 hours. Sepsis Labs: No results for input(s): PROCALCITON, LATICACIDVEN in the last 168 hours.  Recent Results (from the past 240 hour(s))  SARS Coronavirus 2 [facility] order, Performed in [facility] hospital lab) Nasopharyngeal Nasopharyngeal Swab     Status: None   Collection Time: 03/06/19  9:40 PM   Specimen: Nasopharyngeal Swab  Result Value Ref Range Status   SARS Coronavirus 2 NEGATIVE NEGATIVE Final    Comment: (NOTE) If result is NEGATIVE SARS-CoV-2 target nucleic acids are NOT DETECTED. The SARS-CoV-2 RNA is generally detectable in upper and lower  respiratory specimens during the acute phase of infection. The lowest  concentration of SARS-CoV-2 viral copies this assay can detect is 250  copies / mL. A negative result does not  preclude SARS-CoV-2 infection  and should not be used as the sole basis for treatment or other  patient management decisions.  A negative result may occur with  improper specimen collection / handling, submission of specimen other  than nasopharyngeal swab, presence of viral mutation(s) within the  areas targeted by this assay, and inadequate number of viral copies  (<250 copies / mL). A negative result must be combined with clinical  observations, patient history, and epidemiological information. If result is POSITIVE SARS-CoV-2 target nucleic acids are DETECTED. The SARS-CoV-2 RNA is generally detectable in upper and lower  respiratory specimens dur ing the acute phase of infection.  Positive  results are indicative of active infection with SARS-CoV-2.  Clinical  correlation with patient history and other diagnostic information is  necessary to determine patient infection status.  Positive results do  not rule out bacterial infection or co-infection with other viruses. If result is PRESUMPTIVE POSTIVE SARS-CoV-2 nucleic acids MAY BE PRESENT.   A presumptive  positive result was obtained on the submitted specimen  and confirmed on repeat testing.  While 2019 novel coronavirus  (SARS-CoV-2) nucleic acids may be present in the submitted sample  additional confirmatory testing may be necessary for epidemiological  and / or clinical management purposes  to differentiate between  SARS-CoV-2 and other Sarbecovirus currently known to infect humans.  If clinically indicated additional testing with an alternate test  methodology [id number]) is advised. The SARS-CoV-2 RNA is generally  detectable in upper and lower respiratory sp ecimens during the acute  phase of infection. The expected result is Negative. Fact Sheet for Patients:  StrictlyIdeas.no Fact Sheet for Healthcare Providers: BankingDealers.co.za This test is not yet approved or cleared by the [place] FDA and has been authorized for detection and/or diagnosis of SARS-CoV-2 by FDA under an Emergency Use Authorization (EUA).  This EUA will remain in effect (meaning this test can be used) for the duration of the COVID-19 declaration under Section 564(b)(1) of the Act, 21 U.S.C. section 360bbb-3(b)(1), unless the authorization is terminated or revoked sooner. Performed at [facility], [address] [place], [place] [address]          Radiology Studies: No results found.      Scheduled Meds:  amLODipine  5 mg Oral Daily   apixaban  5 mg Oral BID   aspirin EC  81 mg Oral Daily   atorvastatin  40 mg Oral q1800   carvedilol  25 mg Oral BID WC   lidocaine  1 patch Transdermal Q24H   pantoprazole  40 mg Oral BID   Continuous Infusions:  sodium chloride 10 mL/hr at 03/08/19 1200     LOS: 5 days    Time spent: 32 minutes    Hosie Poisson, MD [facility] Pager [phone]  If 7PM-7AM, please contact night-coverage [email or web address] Password [email or web address] 03/12/2019, 12:55 PM

## 2019-03-12 NOTE — TOC Transition Note (Signed)
Transition of Care [facility]) - CM/SW Discharge Note   Patient Details  Name: ABIGAHIL SINHA MRN: [id number] Date of Birth: 1940/09/27  Transition of Care [facility] CM/SW Contact:  Pollie Friar, RN Phone Number: 03/12/2019, 4:42 PM   Clinical Narrative:    Pt discharging home with [facility] services through [facility]. Butch Penny with [facility] accepted the referral.  Pt has needed transportation and assistance at home.    Final next level of care: Home w Home Health Services Barriers to Discharge: No Barriers Identified   Patient Goals and CMS Choice   CMS Medicare.gov Compare Post Acute Care list provided to:: Patient Represenative (must comment) Choice offered to / list presented to : Adult Children  Discharge Placement                       Discharge Plan and Services   Discharge Planning Services: CM Consult Post Acute Care Choice: Home Health                    HH Arranged: RN, PT, OT, Speech Therapy, Nurse's Aide [facility] Agency: [place] ([facility]) Date [place]: 03/12/19   Representative spoke with at [place]: [place] (SDOH) Interventions     Readmission Risk Interventions No flowsheet data found.

## 2019-03-12 NOTE — TOC Initial Note (Signed)
Transition of Care [facility]) - Initial/Assessment Note    Patient Details  Name: [name] MRN: [id number] Date of Birth: [birth year]  Transition [facility]) CM/SW Contact:    Pollie Friar, RN Phone Number: 03/12/2019, 4:42 PM  Clinical Narrative:                   Expected Discharge Plan: Home w Home Health Services Barriers to Discharge: No Barriers Identified   Patient Goals and CMS Choice   CMS Medicare.gov Compare Post Acute Care list provided to:: Patient Represenative (must comment) Choice offered to / list presented to : Adult Children  Expected Discharge Plan and Services Expected Discharge Plan: [place]   Discharge Planning Services: CM Consult Post Acute Care Choice: [place] arrangements for the past 2 months: Single Family Home Expected Discharge Date: 03/12/19                         HH Arranged: RN, PT, OT, Speech Therapy, Nurse's Aide HH Agency: [facility] ([place]) Date HH Agency Contacted: 03/12/19   Representative spoke with at [place]: Butch Penny  Prior Living Arrangements/Services Living arrangements for the past 2 months: [place] Lives with:: Adult Children(son and grandsons) Patient language and need for interpreter reviewed:: Yes(no needs) Do you feel safe going back to the place where you live?: Yes      Need for Family Participation in Patient Care: Yes (Comment) Care giver support system in place?: Yes (comment)(son and grandsons can provide needed supervision)   Criminal Activity/Legal Involvement Pertinent to Current Situation/Hospitalization: No - Comment as needed  Activities of Daily Living Home Assistive Devices/Equipment: None ADL Screening (condition at time of admission) Patient's cognitive ability adequate to safely complete daily activities?: No Is the patient deaf or have difficulty hearing?: No Does the patient have difficulty seeing, even when wearing  glasses/contacts?: No Does the patient have difficulty concentrating, remembering, or making decisions?: No Patient able to express need for assistance with ADLs?: No Does the patient have difficulty dressing or bathing?: No Independently performs ADLs?: No Communication: Needs assistance Is this a change from baseline?: Change from baseline, expected to last <3 days Dressing (OT): Needs assistance Is this a change from baseline?: Change from baseline, expected to last >3 days Grooming: Needs assistance Is this a change from baseline?: Change from baseline, expected to last <3 days Feeding: Needs assistance Is this a change from baseline?: Change from baseline, expected to last >3 days Bathing: Needs assistance Is this a change from baseline?: Change from baseline, expected to last <3 days Toileting: Independent In/Out Bed: Independent Walks in Home: Independent Does the patient have difficulty walking or climbing stairs?: No Weakness of Legs: Left Weakness of Arms/Hands: Left  Permission Sought/Granted                  Emotional Assessment Appearance:: Appears stated age Attitude/Demeanor/Rapport: Engaged Affect (typically observed): Accepting Orientation: : Oriented to Self, Oriented to Place, Oriented to  Time, Oriented to Situation   Psych Involvement: No (comment)  Admission diagnosis:  Elevated blood pressure reading with diagnosis of hypertension [I10] Cerebrovascular accident (CVA), unspecified mechanism ([place]) [I63.9] Patient Active Problem List   Diagnosis Date Noted  . Stroke ([place]) 03/07/2019  . Subtherapeutic international normalized ratio (INR) 03/07/2019  . S/P left knee arthroscopy 04/18/18 06/06/2018  . Acute medial meniscus tear, left, subsequent encounter 04/18/2018  . Hypokalemia 04/15/2018  .  Syncope and collapse 04/15/2018  . Pneumonia 02/18/2018  . Bilateral pleural effusion   . Generalized postprandial abdominal pain   . CAP (community acquired  pneumonia) 02/15/2018  . Orthostatic hypotension 12/21/2017  . Peripheral neuropathy 12/21/2017  . CAD (coronary artery disease) 12/19/2017  . Long term (current) use of anticoagulants 03/14/2017  . Numbness of face 01/09/2017  . Unstable angina ([place]) 11/02/2016  . Other fatigue 11/02/2016  . B12 deficiency 10/16/2016  . Preoperative cardiovascular examination 09/21/2016  . Hyperglycemia 06/26/2016  . Atrial fibrillation with RVR ([place]) 06/23/2016  . Encounter for medication review and counseling 06/07/2016  . Essential hypertension, benign 04/19/2016  . Aortic valve stenosis 09/28/2015  . 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 [place] lesion, acute   . IDA (iron deficiency anemia)   . Mixed hyperlipidemia 05/05/2015  . GERD (gastroesophageal reflux disease) 05/05/2015  . Absolute anemia   . Anticoagulated on Coumadin   . Osteoarthritis 04/16/2015  . History of pulmonary embolism 03/02/2015  . Shortness of breath 02/22/2015  . Benign neoplasm of descending colon 02/17/2015  . Diverticulosis of large intestine without diverticulitis 02/17/2015  . Esophageal stricture 02/16/2015  . UGI bleed 02/13/2015  . Supratherapeutic INR 02/13/2015  . Thoracic back pain 10/15/2014  . Macular degeneration 09/01/2014  . Benign paroxysmal positional vertigo   . Uncontrolled hypertension 08/24/2014  . Complaints of total body pain 08/03/2014  . Sleep disturbance 08/03/2014  . PAF (paroxysmal atrial fibrillation) ([place]) 01/21/2014  . Long term current use of anticoagulant therapy 01/21/2014  . Chronic diastolic congestive heart failure ([place]) 01/21/2014  . Chest pain 01/21/2014   PCP:  Dorothyann Peng, NP Pharmacy:   [facility], [place] [id number] [place] [id number] Phone: [phone] Fax: [phone]  [facility] [place], [place] - [address] [place] [address] Phone:  [phone] Fax: [phone]     Social Determinants of Health (SDOH) Interventions    Readmission Risk Interventions No flowsheet data found.

## 2019-03-12 NOTE — Discharge Summary (Signed)
Physician Discharge Summary  [name] [id number] DOB: [birth year] DOA: 03/06/2019  PCP: Dorothyann Peng, NP  Admit date: 03/06/2019 Discharge date: 03/12/2019  Admitted From: Home.  Disposition: Home.  Recommendations for Outpatient Follow-up:  1. Follow up with PCP in 1-2 weeks 2. Please obtain BMP/CBC in one week 3. Please follow up with neurology as recommended.   Home Health: yes.   Discharge Condition: guarded.  CODE STATUS: full code.  Diet recommendation: Heart Healthy  Dysphagia 1 diet with nectar thick liquids.   Brief/Interim Summary: 78 year old lady prior history of atrial fibrillation on Coumadin, CHF, CVA, pulmonary embolism, hypertension, coronary artery disease, aortic stenosis was brought into the hospital for difficulty speaking.  MRI of the brain revealed right MCA infarct with occlusion of M2.  Neurology on board and recommended transition Coumadin to NOAC on discharge.  Patient had significant dysphagia on admission which has improved and she is currently on dysphagia 1 diet. Once she is able to tolerate dysphagia diet we will be able to discharge her home with home PT.  Discharge Diagnoses:  Principal Problem:   Stroke [facility]) Active Problems:   PAF (paroxysmal atrial fibrillation) ([place])   Long term current use of anticoagulant therapy   Chronic diastolic congestive heart failure ([name])   Mixed hyperlipidemia   Essential hypertension, benign   Subtherapeutic international normalized ratio (INR)  Acute right MCA infarct With some speech impairment, dysphagia and right-sided facial numbness which have improved. MRI of the brain and MRA of the head and neck showed right MCA infarct involving right frontal operculum, with the right M2 occlusion.  Neurology on board recommended to continue with Eliquis on discharge along with aspirin. Echocardiogram this admission showed preserved left ventricular ejection fraction with normal wall motion and moderate aortic  stenosis. Hemoglobin A1c 6.2 LDL is 107 Therapy eval's recommending home health PT and OT on discharge. SLP evaluation recommended transition to dysphagia diet with nectar thick liquids and if able to tolerate we will plan for discharge in the next 24 hours.   Severe dysphagia from acute CVA Improving If able to tolerate dysphagia 1 diet with nectar thick liquids will plan for discharge in the next 24 hours.   Chronic diastolic heart failure Patient appears euvolemic and compensated.    Paroxysmal atrial fibrillation Rate control with Coreg and patient is on Eliquis.   Essential hypertension Well-controlled blood pressure parameters.   GERD Continue with PPI   Hyperlipidemia  Goal LDL less than 70 continue with Lipitor 40 mg daily.   Discharge Instructions  Discharge Instructions    Ambulatory referral to Neurology   Complete by: As directed    Follow up with stroke clinic NP (Jessica Vanschaick or Cecille Rubin, if both not available, consider Zachery Dauer, or Ahern) at [facility] in about 4 weeks. Thanks.   Diet - low sodium heart healthy   Complete by: As directed      Allergies as of 03/12/2019      Reactions   Iodinated Diagnostic Agents Anaphylaxis, Other (See Comments)   IPD dye Info given by patient   Ioxaglate Anaphylaxis, Other (See Comments)   Info given by patient   Red Dye Anaphylaxis   Lactose Intolerance (gi)    Whey Other (See Comments)   Lactose intolerance   Darvon [propoxyphene] Rash   Hydralazine Anxiety, Other (See Comments)   Facial flushing, pt prefers not to use it.    Milk-related Compounds Other (See Comments)   Lactose intolerance Can tolerate milk if its  cooked into the recipe, just can't drink milk       Medication List    STOP taking these medications   ezetimibe-simvastatin 10-20 MG tablet Commonly known as: VYTORIN   potassium chloride 10 MEQ tablet Commonly known as: K-DUR   warfarin 5 MG tablet Commonly  known as: COUMADIN     TAKE these medications   acetaminophen 500 MG tablet Commonly known as: TYLENOL Take 500-1,000 mg by mouth daily as needed for mild pain or moderate pain.   amLODipine 5 MG tablet Commonly known as: NORVASC Take 1 tablet (5 mg total) by mouth daily.   apixaban 5 MG Tabs tablet Commonly known as: ELIQUIS Take 1 tablet (5 mg total) by mouth 2 (two) times daily.   aspirin EC 81 MG tablet Take 1 tablet (81 mg total) by mouth daily.   atorvastatin 40 MG tablet Commonly known as: LIPITOR Take 1 tablet (40 mg total) by mouth daily at 6 PM.   carvedilol 25 MG tablet Commonly known as: COREG TAKE (1) TABLET BY MOUTH TWICE DAILY WITH A MEAL. What changed: See the new instructions.   Flintstones Plus Iron chewable tablet Chew 2 tablets by mouth daily.   furosemide 40 MG tablet Commonly known as: LASIX TAKE 1 TABLET BY MOUTH ONCE DAILY.   meclizine 25 MG tablet Commonly known as: ANTIVERT Take 1 tablet (25 mg total) by mouth as needed for dizziness or nausea.   ondansetron 4 MG tablet Commonly known as: Zofran Take 1 tablet (4 mg total) by mouth every 8 (eight) hours as needed for nausea or vomiting.   pantoprazole 40 MG tablet Commonly known as: PROTONIX TAKE 1 TABLET BY MOUTH TWICE DAILY.   temazepam 15 MG capsule Commonly known as: RESTORIL TAKE (1) CAPSULE BY MOUTH AT BEDTIME. What changed: See the new instructions.   traMADol 50 MG tablet Commonly known as: ULTRAM TAKE (1) TABLET BY MOUTH EVERY EIGHT HOURS. What changed: See the new instructions.   triamcinolone cream 0.1 % Commonly known as: KENALOG Apply 1 application topically 2 (two) times daily.      Follow-up Information    [facility]. Schedule an appointment as soon as possible for a visit in 4 week(s).   Specialty: Neurology Contact information: [address] [place] [phone]       Dorothyann Peng, [place]. Schedule  an appointment as soon as possible for a visit in 1 week(s).   Specialty: Family Medicine Contact information: [place] [address] [phone]        Pixie Casino, MD .   Specialty: Cardiology Contact information: [address] [place] [address] [phone]          Allergies  Allergen Reactions  . Iodinated Diagnostic Agents Anaphylaxis and Other (See Comments)    IPD dye Info given by patient  . Ioxaglate Anaphylaxis and Other (See Comments)    Info given by patient  . Red Dye Anaphylaxis  . Lactose Intolerance (Gi)   . Whey Other (See Comments)    Lactose intolerance  . Darvon [Propoxyphene] Rash  . Hydralazine Anxiety and Other (See Comments)    Facial flushing, pt prefers not to use it.   . Milk-Related Compounds Other (See Comments)    Lactose intolerance Can tolerate milk if its cooked into the recipe, just can't drink milk     Consultations:  Neurology.    Procedures/Studies: Ct Head Wo Contrast  Result Date:  03/09/2019 CLINICAL DATA:  78 year old female with right MCA territory infarct. Follow-up study. EXAM: CT HEAD WITHOUT CONTRAST TECHNIQUE: Contiguous axial images were obtained from the base of the skull through the vertex without intravenous contrast. COMPARISON:  MRI dated 03/07/2019 and head CT dated 03/06/2019 FINDINGS: Brain: Interval development of an area of white matter hypodensity in the right frontal centrum semiovale in keeping with ischemic infarct. Small focal hypodensity in the left thalamus as well as old right cerebellar infarct again noted. No acute intracranial hemorrhage. No mass effect or midline shift. No extra-axial fluid collection. Vascular: No hyperdense vessel or unexpected calcification. Skull: Normal. Negative for fracture or focal lesion. Sinuses/Orbits: No acute finding. Other: None IMPRESSION: 1. No acute intracranial hemorrhage. 2. Interval development of an area of white  matter hypodensity in the right frontal centrum semiovale in keeping with ischemic infarct. 3. Old right cerebellar and left thalamic infarcts. Electronically Signed   By: Anner Crete M.D.   On: 03/09/2019 19:49   Mr Angio Head Wo Contrast  Result Date: 03/07/2019 CLINICAL DATA:  Initial evaluation for acute speech difficulty, stroke-like symptoms. EXAM: MRI HEAD WITHOUT CONTRAST MRA HEAD WITHOUT CONTRAST MRA NECK WITHOUT CONTRAST TECHNIQUE: Multiplanar, multiecho pulse sequences of the brain and surrounding structures were obtained without intravenous contrast. Angiographic images of the Circle of Willis were obtained using MRA technique without intravenous contrast. Angiographic images of the neck were obtained using MRA technique without intravenous contrast. Carotid stenosis measurements (when applicable) are obtained utilizing NASCET criteria, using the distal internal carotid diameter as the denominator. COMPARISON:  Prior head CT from 03/06/2019. FINDINGS: MRI HEAD FINDINGS Brain: Generalized age-related cerebral atrophy. Patchy and confluent T2/FLAIR hyperintensity within the periventricular and deep white matter both cerebral hemispheres most consistent with chronic small vessel ischemic disease, mild in nature. Several scattered superimposed remote lacunar infarcts seen involving the right basal ganglia and hemispheric cerebral white matter. Chronic superior right cerebellar infarct noted. Patchy small volume restricted diffusion seen involving the cortical and subcortical right frontal operculum, consistent with acute right MCA territory infarct. No associated hemorrhage or mass effect. No other evidence for acute or subacute ischemia. No acute intracranial hemorrhage. No mass lesion, midline shift or mass effect. No hydrocephalus. No extra-axial fluid collection. Pituitary gland suprasellar region normal. Midline structures intact. Vascular: Focus of susceptibility artifact seen at the inferior  right sylvian fissure, suspected to reflect thrombus in a right M2 branch, seen on corresponding MRA (series 18, image 28). Major intracranial vascular flow voids are otherwise maintained. Skull and upper cervical spine: Craniocervical junction within normal limits. Postsurgical changes partially visualize within the upper cervical spine. Bone marrow signal intensity normal. No scalp soft tissue abnormality. Sinuses/Orbits: Patient status post bilateral ocular lens replacement. Paranasal sinuses are clear. No significant mastoid effusion. Inner ear structures normal. Other: None. MRA HEAD FINDINGS ANTERIOR CIRCULATION: Distal cervical segments of the internal carotid arteries are patent with symmetric antegrade flow. Petrous segments patent bilaterally. Scattered atheromatous irregularity within the cavernous/supraclinoid ICAs without high-grade stenosis, slightly worse on the right. A1 segments patent bilaterally. Normal anterior communicating artery. Anterior cerebral arteries widely patent to their distal aspects without stenosis. Right M1 widely patent. Normal right MCA bifurcation. There is focal occlusion of a proximal right M2 branch at the base of the sylvian fissure, corresponding with susceptibility artifacts seen on corresponding MRI (series 7, image 110). Right MCA branches otherwise perfused. Left M1 patent. Normal left MCA bifurcation. Distal left MCA branches well perfused. POSTERIOR CIRCULATION: Mild atheromatous irregularity within  the vertebral arteries bilaterally without flow-limiting stenosis. Left vertebral artery dominant. Posterior inferior cerebral arteries patent bilaterally. Basilar patent to its distal aspect without flow-limiting stenosis. Superior cerebral arteries patent bilaterally. Hypoplastic P1 segments with robust bilateral posterior communicating arteries. Scattered atheromatous irregularity throughout the PCAs without high-grade flow-limiting stenosis. Distal small vessel  atheromatous irregularity seen throughout the intracranial circulation. No intracranial aneurysm. MRA NECK FINDINGS AORTIC ARCH: Visualized aortic arch of normal caliber with normal 3 vessel morphology. No hemodynamically significant stenosis seen about the origin of the great vessels. Visualized subclavian arteries widely patent. RIGHT CAROTID SYSTEM: Right common carotid artery widely patent from its origin to the bifurcation. Mild atheromatous irregularity about the right bifurcation and proximal right ICA without hemodynamically significant stenosis. Right ICA widely patent distally to the skull base without stenosis or occlusion. LEFT CAROTID SYSTEM: Left common carotid artery widely patent from its origin to the bifurcation without stenosis. Mild atheromatous irregularity about the left bifurcation/proximal left ICA without hemodynamically significant stenosis. Left ICA widely patent distally to the skull base without stenosis or occlusion. VERTEBRAL ARTERIES: Both vertebral arteries arise from the subclavian arteries. Vertebral arteries not well assessed proximally. Left vertebral artery dominant. Visualized vertebral arteries widely patent within the neck without stenosis or occlusion. IMPRESSION: MRI HEAD IMPRESSION: 1. Patchy small volume acute ischemic nonhemorrhagic right MCA territory infarct involving the right frontal operculum. 2. Underlying moderate chronic microvascular ischemic disease with scattered remote lacunar infarcts within the right basal ganglia and hemispheric cerebral white matter. 3. Superimposed chronic superior right cerebellar infarct. MRA HEAD IMPRESSION: 1. Acute right M2 occlusion, consistent with the right MCA territory infarct. 2. Moderate atherosclerotic change elsewhere throughout the intracranial circulation. No other hemodynamically significant or correctable stenosis. MRA NECK IMPRESSION: 1. Mild atheromatous irregularity about the carotid bifurcations/proximal ICAs  bilaterally without hemodynamically significant or critical flow limiting stenosis. Both carotid artery systems otherwise widely patent within the neck. 2. Wide patency of the vertebral arteries within the neck. Left vertebral artery dominant Electronically Signed   By: Jeannine Boga M.D.   On: 03/07/2019 05:24   Mr Angio Neck Wo Contrast  Result Date: 03/07/2019 CLINICAL DATA:  Initial evaluation for acute speech difficulty, stroke-like symptoms. EXAM: MRI HEAD WITHOUT CONTRAST MRA HEAD WITHOUT CONTRAST MRA NECK WITHOUT CONTRAST TECHNIQUE: Multiplanar, multiecho pulse sequences of the brain and surrounding structures were obtained without intravenous contrast. Angiographic images of the Circle of Willis were obtained using MRA technique without intravenous contrast. Angiographic images of the neck were obtained using MRA technique without intravenous contrast. Carotid stenosis measurements (when applicable) are obtained utilizing NASCET criteria, using the distal internal carotid diameter as the denominator. COMPARISON:  Prior head CT from 03/06/2019. FINDINGS: MRI HEAD FINDINGS Brain: Generalized age-related cerebral atrophy. Patchy and confluent T2/FLAIR hyperintensity within the periventricular and deep white matter both cerebral hemispheres most consistent with chronic small vessel ischemic disease, mild in nature. Several scattered superimposed remote lacunar infarcts seen involving the right basal ganglia and hemispheric cerebral white matter. Chronic superior right cerebellar infarct noted. Patchy small volume restricted diffusion seen involving the cortical and subcortical right frontal operculum, consistent with acute right MCA territory infarct. No associated hemorrhage or mass effect. No other evidence for acute or subacute ischemia. No acute intracranial hemorrhage. No mass lesion, midline shift or mass effect. No hydrocephalus. No extra-axial fluid collection. Pituitary gland suprasellar  region normal. Midline structures intact. Vascular: Focus of susceptibility artifact seen at the inferior right sylvian fissure, suspected to reflect thrombus in a right M2  branch, seen on corresponding MRA (series 18, image 28). Major intracranial vascular flow voids are otherwise maintained. Skull and upper cervical spine: Craniocervical junction within normal limits. Postsurgical changes partially visualize within the upper cervical spine. Bone marrow signal intensity normal. No scalp soft tissue abnormality. Sinuses/Orbits: Patient status post bilateral ocular lens replacement. Paranasal sinuses are clear. No significant mastoid effusion. Inner ear structures normal. Other: None. MRA HEAD FINDINGS ANTERIOR CIRCULATION: Distal cervical segments of the internal carotid arteries are patent with symmetric antegrade flow. Petrous segments patent bilaterally. Scattered atheromatous irregularity within the cavernous/supraclinoid ICAs without high-grade stenosis, slightly worse on the right. A1 segments patent bilaterally. Normal anterior communicating artery. Anterior cerebral arteries widely patent to their distal aspects without stenosis. Right M1 widely patent. Normal right MCA bifurcation. There is focal occlusion of a proximal right M2 branch at the base of the sylvian fissure, corresponding with susceptibility artifacts seen on corresponding MRI (series 7, image 110). Right MCA branches otherwise perfused. Left M1 patent. Normal left MCA bifurcation. Distal left MCA branches well perfused. POSTERIOR CIRCULATION: Mild atheromatous irregularity within the vertebral arteries bilaterally without flow-limiting stenosis. Left vertebral artery dominant. Posterior inferior cerebral arteries patent bilaterally. Basilar patent to its distal aspect without flow-limiting stenosis. Superior cerebral arteries patent bilaterally. Hypoplastic P1 segments with robust bilateral posterior communicating arteries. Scattered  atheromatous irregularity throughout the PCAs without high-grade flow-limiting stenosis. Distal small vessel atheromatous irregularity seen throughout the intracranial circulation. No intracranial aneurysm. MRA NECK FINDINGS AORTIC ARCH: Visualized aortic arch of normal caliber with normal 3 vessel morphology. No hemodynamically significant stenosis seen about the origin of the great vessels. Visualized subclavian arteries widely patent. RIGHT CAROTID SYSTEM: Right common carotid artery widely patent from its origin to the bifurcation. Mild atheromatous irregularity about the right bifurcation and proximal right ICA without hemodynamically significant stenosis. Right ICA widely patent distally to the skull base without stenosis or occlusion. LEFT CAROTID SYSTEM: Left common carotid artery widely patent from its origin to the bifurcation without stenosis. Mild atheromatous irregularity about the left bifurcation/proximal left ICA without hemodynamically significant stenosis. Left ICA widely patent distally to the skull base without stenosis or occlusion. VERTEBRAL ARTERIES: Both vertebral arteries arise from the subclavian arteries. Vertebral arteries not well assessed proximally. Left vertebral artery dominant. Visualized vertebral arteries widely patent within the neck without stenosis or occlusion. IMPRESSION: MRI HEAD IMPRESSION: 1. Patchy small volume acute ischemic nonhemorrhagic right MCA territory infarct involving the right frontal operculum. 2. Underlying moderate chronic microvascular ischemic disease with scattered remote lacunar infarcts within the right basal ganglia and hemispheric cerebral white matter. 3. Superimposed chronic superior right cerebellar infarct. MRA HEAD IMPRESSION: 1. Acute right M2 occlusion, consistent with the right MCA territory infarct. 2. Moderate atherosclerotic change elsewhere throughout the intracranial circulation. No other hemodynamically significant or correctable  stenosis. MRA NECK IMPRESSION: 1. Mild atheromatous irregularity about the carotid bifurcations/proximal ICAs bilaterally without hemodynamically significant or critical flow limiting stenosis. Both carotid artery systems otherwise widely patent within the neck. 2. Wide patency of the vertebral arteries within the neck. Left vertebral artery dominant Electronically Signed   By: Jeannine Boga M.D.   On: 03/07/2019 05:24   Mr Brain Wo Contrast  Result Date: 03/07/2019 CLINICAL DATA:  Initial evaluation for acute speech difficulty, stroke-like symptoms. EXAM: MRI HEAD WITHOUT CONTRAST MRA HEAD WITHOUT CONTRAST MRA NECK WITHOUT CONTRAST TECHNIQUE: Multiplanar, multiecho pulse sequences of the brain and surrounding structures were obtained without intravenous contrast. Angiographic images of the Circle of Willis were obtained  using MRA technique without intravenous contrast. Angiographic images of the neck were obtained using MRA technique without intravenous contrast. Carotid stenosis measurements (when applicable) are obtained utilizing NASCET criteria, using the distal internal carotid diameter as the denominator. COMPARISON:  Prior head CT from 03/06/2019. FINDINGS: MRI HEAD FINDINGS Brain: Generalized age-related cerebral atrophy. Patchy and confluent T2/FLAIR hyperintensity within the periventricular and deep white matter both cerebral hemispheres most consistent with chronic small vessel ischemic disease, mild in nature. Several scattered superimposed remote lacunar infarcts seen involving the right basal ganglia and hemispheric cerebral white matter. Chronic superior right cerebellar infarct noted. Patchy small volume restricted diffusion seen involving the cortical and subcortical right frontal operculum, consistent with acute right MCA territory infarct. No associated hemorrhage or mass effect. No other evidence for acute or subacute ischemia. No acute intracranial hemorrhage. No mass lesion, midline  shift or mass effect. No hydrocephalus. No extra-axial fluid collection. Pituitary gland suprasellar region normal. Midline structures intact. Vascular: Focus of susceptibility artifact seen at the inferior right sylvian fissure, suspected to reflect thrombus in a right M2 branch, seen on corresponding MRA (series 18, image 28). Major intracranial vascular flow voids are otherwise maintained. Skull and upper cervical spine: Craniocervical junction within normal limits. Postsurgical changes partially visualize within the upper cervical spine. Bone marrow signal intensity normal. No scalp soft tissue abnormality. Sinuses/Orbits: Patient status post bilateral ocular lens replacement. Paranasal sinuses are clear. No significant mastoid effusion. Inner ear structures normal. Other: None. MRA HEAD FINDINGS ANTERIOR CIRCULATION: Distal cervical segments of the internal carotid arteries are patent with symmetric antegrade flow. Petrous segments patent bilaterally. Scattered atheromatous irregularity within the cavernous/supraclinoid ICAs without high-grade stenosis, slightly worse on the right. A1 segments patent bilaterally. Normal anterior communicating artery. Anterior cerebral arteries widely patent to their distal aspects without stenosis. Right M1 widely patent. Normal right MCA bifurcation. There is focal occlusion of a proximal right M2 branch at the base of the sylvian fissure, corresponding with susceptibility artifacts seen on corresponding MRI (series 7, image 110). Right MCA branches otherwise perfused. Left M1 patent. Normal left MCA bifurcation. Distal left MCA branches well perfused. POSTERIOR CIRCULATION: Mild atheromatous irregularity within the vertebral arteries bilaterally without flow-limiting stenosis. Left vertebral artery dominant. Posterior inferior cerebral arteries patent bilaterally. Basilar patent to its distal aspect without flow-limiting stenosis. Superior cerebral arteries patent  bilaterally. Hypoplastic P1 segments with robust bilateral posterior communicating arteries. Scattered atheromatous irregularity throughout the PCAs without high-grade flow-limiting stenosis. Distal small vessel atheromatous irregularity seen throughout the intracranial circulation. No intracranial aneurysm. MRA NECK FINDINGS AORTIC ARCH: Visualized aortic arch of normal caliber with normal 3 vessel morphology. No hemodynamically significant stenosis seen about the origin of the great vessels. Visualized subclavian arteries widely patent. RIGHT CAROTID SYSTEM: Right common carotid artery widely patent from its origin to the bifurcation. Mild atheromatous irregularity about the right bifurcation and proximal right ICA without hemodynamically significant stenosis. Right ICA widely patent distally to the skull base without stenosis or occlusion. LEFT CAROTID SYSTEM: Left common carotid artery widely patent from its origin to the bifurcation without stenosis. Mild atheromatous irregularity about the left bifurcation/proximal left ICA without hemodynamically significant stenosis. Left ICA widely patent distally to the skull base without stenosis or occlusion. VERTEBRAL ARTERIES: Both vertebral arteries arise from the subclavian arteries. Vertebral arteries not well assessed proximally. Left vertebral artery dominant. Visualized vertebral arteries widely patent within the neck without stenosis or occlusion. IMPRESSION: MRI HEAD IMPRESSION: 1. Patchy small volume acute ischemic nonhemorrhagic right MCA territory  infarct involving the right frontal operculum. 2. Underlying moderate chronic microvascular ischemic disease with scattered remote lacunar infarcts within the right basal ganglia and hemispheric cerebral white matter. 3. Superimposed chronic superior right cerebellar infarct. MRA HEAD IMPRESSION: 1. Acute right M2 occlusion, consistent with the right MCA territory infarct. 2. Moderate atherosclerotic change  elsewhere throughout the intracranial circulation. No other hemodynamically significant or correctable stenosis. MRA NECK IMPRESSION: 1. Mild atheromatous irregularity about the carotid bifurcations/proximal ICAs bilaterally without hemodynamically significant or critical flow limiting stenosis. Both carotid artery systems otherwise widely patent within the neck. 2. Wide patency of the vertebral arteries within the neck. Left vertebral artery dominant Electronically Signed   By: Jeannine Boga M.D.   On: 03/07/2019 05:24   Dg Swallowing Func-speech Pathology  Result Date: 03/10/2019 Objective Swallowing Evaluation: Type of Study: MBS-Modified Barium Swallow Study  Patient Details Name: [name] MRN: [id number] Date of Birth: 1940/10/02 Today's Date: 03/10/2019 Time: SLP Start Time (ACUTE ONLY): 0908 -SLP Stop Time (ACUTE ONLY): 0938 SLP Time Calculation (min) (ACUTE ONLY): 30 min Past Medical History: Past Medical History: Diagnosis Date . Anemia 2005  Generally microcytic, transfusions in [address], 2012, 02/2015, 05/2015 . Aortic stenosis   Moderate November 2017 . Arthritis  . Broken back 2013  Chronic back pain.  [name] CAD (coronary artery disease)   Cardiac catheterization 2014 - 80% mid RCA and 70% OM managed medically . CHF (congestive heart failure) ([place])  . Chronic bilateral pleural effusions  . Chronic headache  . Contrast media allergy  . Diastolic heart failure ([place])  . Esophageal cancer ([place]) 05/06/2015  Adenocarcinoma GE junction . Facial paresthesia  . GERD (gastroesophageal reflux disease)  . H. pylori infection  . H/O iron deficiency   05-06-15 iron infusion ([facility]) . HH (hiatus hernia) 2008  Large with associated erosions . Hypertension  . Paroxysmal atrial fibrillation ([name])  . PONV (postoperative nausea and vomiting)  . Pulmonary emboli ([place]) 2008, 2012 . Stroke [facility]) 1982 Past Surgical History: Past Surgical History: Procedure Laterality Date . APPENDECTOMY  1950s . BACK SURGERY   . CARPAL  TUNNEL RELEASE Bilateral 1990s . [place] SURGERY  2014 . CHOLECYSTECTOMY  1980s  open . COLONOSCOPY N/A 02/17/2015  Procedure: COLONOSCOPY;  Surgeon: [name], MD;  Location: [facility] ENDOSCOPY;  Service: Endoscopy;  Laterality: N/A; . ESOPHAGOGASTRODUODENOSCOPY N/A 02/16/2015  Procedure: ESOPHAGOGASTRODUODENOSCOPY (EGD);  Surgeon: [name], MD;  Location: [facility];  Service: Endoscopy;  Laterality: N/A; . ESOPHAGOGASTRODUODENOSCOPY N/A 05/06/2015  Procedure: ESOPHAGOGASTRODUODENOSCOPY (EGD);  Surgeon: [name], MD;  Location: [facility] ENDOSCOPY;  Service: Endoscopy;  Laterality: N/A; . EUS N/A 05/20/2015  Procedure: UPPER ENDOSCOPIC ULTRASOUND (EUS) LINEAR;  Surgeon: [name], MD;  Location: [facility] ENDOSCOPY;  Service: Endoscopy;  Laterality: N/A; . exploratory lab  1950s or 1960s . GIVENS CAPSULE STUDY N/A 05/06/2015  Procedure: GIVENS CAPSULE STUDY;  Surgeon: [name], MD;  Location: [facility] ENDOSCOPY;  Service: Endoscopy;  Laterality: N/A; . KNEE ARTHROSCOPY WITH MEDIAL MENISECTOMY Left 04/18/2018  Procedure: LEFT KNEE ARTHROSCOPY WITH PARTIAL MEDIAL MENISECTOMY AND ANTERIOR CRUCIATE LIGAMENT DEBRIDEMENT;  Surgeon: [name], MD;  Location: [facility];  Service: Orthopedics;  Laterality: Left; . LEFT HEART CATH AND CORONARY ANGIOGRAPHY N/A 11/08/2016  Procedure: Left Heart Cath and Coronary Angiography;  Surgeon: [name], MD;  Location: [place] CV LAB;  Service: Cardiovascular;  Laterality: N/A; . lumbar back surgery  2012 . [place] SURGERY  1991 . TONSILLECTOMY   . TONSILLECTOMY  AND ADENOIDECTOMY  1960s HPI: LATOYSHA WISHARD is a 78 y.o. female with past medical history significant for chronic congestive heart failure, coronary artery disease, paroxysmal atrial fibrillation on Coumadin, PE, prior stroke, moderate aortic stenosis, hypertension presented to [facility] emergency department for sudden onset slurred speech as well as left facial droop. MRI reveals acute  right M2 occlusion, consistent with the right MCA territory infarct and moderate atherosclerotic change elsewhere throughout the intracranial circulation. Pt with changes yesterday resuting in increased right facial numbness - thus CT showed Old right cerebellar and left thalamic infarcts and right frontal hypodensity c/w ischemic cva.  Subjective: pt awake in chair, states "I'm going to eat today" Assessment / Plan / Recommendation CHL IP CLINICAL IMPRESSIONS 03/10/2019 Clinical Impression Patient presents with moderate oropharyngeal dysphagia with sensorimotor deficits.   Decreased lingual strength/control results in lingual pumping and premature spillage of all boluses into pharynx/larynx before reflexive swallow triggered.  Thin via straw despite chin tuck is aspirated significantly with pt performing reflexive cough that did not clear aspirates. Decreasing bolus size to tsp was helpful to prevent aspiration.  Decreased tongue base retraction and laryngeal elevation allows vallecular residuals across consistencies due to poor epiglottic deflection, ? impact of curvature of anterior spine near cervical fusions. Pt does not sense residuals consistently.  Mastication ability with solids were severely impaired with pt swallowing only minimally masticated cracker.  Head turn left did not decrease residuals but dry swallows effective.  Recommend start full liquid - nectar diet- with strict precautions.  As previously stated, suspect baseline dysphagia -possible impact of cervical fusions and prior strokes on her swallowing skills PTA.  Tsps of water between meals after oral care is recommended. Only thickened drinks with medications, all other intake.  Pt will benefit from skilled SLP to maximize her swallowing rehab, expectoration ability for pharyngeal clearance and cough strength for airway protection.  Using teach back with live monitor during study, educated pt to findings, precautions. SLP Visit Diagnosis  Dysarthria and anarthria (R47.1);Dysphagia, oral phase (R13.11);Dysphagia, oropharyngeal phase (R13.12) Attention and concentration deficit following -- Frontal lobe and executive function deficit following -- Impact on safety and function Moderate aspiration risk;Risk for inadequate nutrition/hydration   CHL IP TREATMENT RECOMMENDATION 03/10/2019 Treatment Recommendations Therapy as outlined in treatment plan below   Prognosis 03/10/2019 Prognosis for Safe Diet Advancement Guarded Barriers to Reach Goals Time post onset;Severity of deficits Barriers/Prognosis Comment -- CHL IP DIET RECOMMENDATION 03/10/2019 SLP Diet Recommendations Nectar thick liquid Liquid Administration via Straw CHIN TUCK WITH EVERY BITE/SIP, INTERMITTENT DRY SWALLOWS, HOCK TO CLEAR PHARYNX, IF COUGHING, PT IS ASPIRATING --- STOP INTAKE Medication Administration Crushed with puree Compensations Slow rate;Small sips/bites;Chin tuck Postural Changes --   CHL IP OTHER RECOMMENDATIONS 03/10/2019 Recommended Consults -- Oral Care Recommendations Oral care BID Other Recommendations Have oral suction available   CHL IP FOLLOW UP RECOMMENDATIONS 03/10/2019 Follow up Recommendations Inpatient Rehab   CHL IP FREQUENCY AND DURATION 03/10/2019 Speech Therapy Frequency (ACUTE ONLY) min 2x/week Treatment Duration 2 weeks      CHL IP ORAL PHASE 03/10/2019 Oral Phase Impaired Oral - Pudding Teaspoon -- Oral - Pudding Cup -- Oral - Honey Teaspoon -- Oral - Honey Cup -- Oral - Nectar Teaspoon Weak lingual manipulation;Reduced posterior propulsion;Lingual pumping;Premature spillage;Decreased bolus cohesion Oral - Nectar Cup Weak lingual manipulation;Lingual pumping;Reduced posterior propulsion;Premature spillage;Decreased bolus cohesion Oral - Nectar Straw Weak lingual manipulation;Lingual pumping;Premature spillage;Decreased bolus cohesion;Reduced posterior propulsion Oral - Thin Teaspoon Weak lingual manipulation;Lingual pumping;Premature spillage;Decreased bolus  cohesion;Reduced posterior propulsion Oral - Thin Cup -- Oral - Thin Straw Lingual pumping;Weak lingual manipulation;Premature spillage Oral - Puree Reduced posterior propulsion;Weak lingual manipulation;Lingual pumping;Premature spillage;Lingual/palatal residue Oral - Mech Soft Weak lingual manipulation;Lingual pumping;Reduced posterior propulsion;Premature spillage;Impaired mastication;Lingual/palatal residue Oral - Regular -- Oral - Multi-Consistency -- Oral - Pill -- Oral Phase - Comment --  CHL IP PHARYNGEAL PHASE 03/10/2019 Pharyngeal Phase Impaired Pharyngeal- Pudding Teaspoon -- Pharyngeal -- Pharyngeal- Pudding Cup -- Pharyngeal -- Pharyngeal- Honey Teaspoon -- Pharyngeal -- Pharyngeal- Honey Cup -- Pharyngeal -- Pharyngeal- Nectar Teaspoon Reduced epiglottic inversion;Reduced pharyngeal peristalsis;Penetration/Aspiration before swallow;Reduced airway/laryngeal closure;Reduced laryngeal elevation;Reduced tongue base retraction;Pharyngeal residue - valleculae Pharyngeal Material enters airway, remains ABOVE vocal cords and not ejected out Pharyngeal- Nectar Cup -- Pharyngeal -- Pharyngeal- Nectar Straw Reduced pharyngeal peristalsis;Reduced epiglottic inversion;Reduced laryngeal elevation;Reduced airway/laryngeal closure;Penetration/Aspiration before swallow;Reduced tongue base retraction;Pharyngeal residue - valleculae Pharyngeal Material enters airway, CONTACTS cords and not ejected out Pharyngeal- Thin Teaspoon Reduced epiglottic inversion;Reduced airway/laryngeal closure;Reduced tongue base retraction;Penetration/Aspiration before swallow;Pharyngeal residue - valleculae Pharyngeal Material enters airway, remains ABOVE vocal cords and not ejected out Pharyngeal- Thin Cup -- Pharyngeal -- Pharyngeal- Thin Straw Reduced epiglottic inversion;Reduced pharyngeal peristalsis;Reduced laryngeal elevation;Reduced airway/laryngeal closure;Reduced tongue base retraction;Pharyngeal residue - valleculae;Moderate  aspiration;Penetration/Aspiration before swallow Pharyngeal Material enters airway, passes BELOW cords and not ejected out despite cough attempt by patient Pharyngeal- Puree Reduced epiglottic inversion;Reduced pharyngeal peristalsis;Reduced anterior laryngeal mobility;Reduced laryngeal elevation;Reduced airway/laryngeal closure;Pharyngeal residue - valleculae;Reduced tongue base retraction Pharyngeal Material enters airway, remains ABOVE vocal cords then ejected out Pharyngeal- Mechanical Soft Reduced pharyngeal peristalsis;Reduced epiglottic inversion;Reduced laryngeal elevation;Reduced airway/laryngeal closure;Reduced tongue base retraction;Pharyngeal residue - valleculae Pharyngeal Material does not enter airway Pharyngeal- Regular -- Pharyngeal -- Pharyngeal- Multi-consistency -- Pharyngeal -- Pharyngeal- Pill -- Pharyngeal -- Pharyngeal Comment tight chin tuck posture effective to prevent aspiration with nectar, head turn left did not prevent vallecular residual, cued "hock" did not clear residuals from vallecular region, dry swallows helpful, cued cough was too weak to clear deep aspirates of thin, pt with silent laryngeal penetration to cord but audible aspiration with large amount of thin aspiration  CHL IP CERVICAL ESOPHAGEAL PHASE 03/10/2019 Cervical Esophageal Phase Impaired Pudding Teaspoon -- Pudding Cup -- Honey Teaspoon -- Honey Cup -- Nectar Teaspoon -- Nectar Cup -- Nectar Straw -- Thin Teaspoon -- Thin Cup -- Thin Straw -- Puree -- Mechanical Soft -- Regular -- Multi-consistency -- Pill -- Cervical Esophageal Comment -- [name] 03/10/2019, 10:36 AM  Luanna Salk, MS [facility] SLP Acute Rehab Services Pager [phone] Office [phone]             Ct Head Code Stroke Wo Contrast  Result Date: 03/06/2019 CLINICAL DATA:  Code stroke. Slurred speech. Left-sided facial droop. Symptoms began 1900 hours. Headache. EXAM: CT HEAD WITHOUT CONTRAST TECHNIQUE: Contiguous axial images were obtained  from the base of the skull through the vertex without intravenous contrast. COMPARISON:  MRI 12/20/2017.  CT 12/19/2017. FINDINGS: Brain: Old infarctions affect the right cerebellum. Cerebral hemispheres show early cortical low-density in the right frontal operculum. There is old infarction in the radiating white matter tracts on the right. No mass lesion, hemorrhage, hydrocephalus or extra-axial collection. Vascular: There is atherosclerotic calcification of the major vessels at the base of the brain. Skull: Normal Sinuses/Orbits: Clear/normal Other: None ASPECTS ([place] Stroke Program Early CT Score) - Ganglionic level infarction (caudate, lentiform nuclei, internal capsule, insula, M1-M3 cortex): 6 - Supraganglionic infarction (M4-M6 cortex): 3 Total score (0-10 with 10 being normal): 9 IMPRESSION: 1.  Question loss of gray-white differentiation in the right frontal operculum, M1 sector. No evidence of hemorrhage or mass effect. Old infarctions in the right cerebellum and right radiating white matter tracts. 2. ASPECTS is 9 3. These results were called by telephone at the time of interpretation on 03/06/2019 at 9:44 pm to Dr. Aletta Edouard , who verbally acknowledged these results. Electronically Signed   By: Nelson Chimes M.D.   On: 03/06/2019 21:47       Subjective: No complaints.   Discharge Exam: Vitals:   03/12/19 1237 03/12/19 1530  BP: 137/70 132/60  Pulse: 81 65  Resp: 18 18  Temp: 98.4 F (36.9 C) 99.9 F (37.7 C)  SpO2: 95% 96%   Vitals:   03/11/19 2344 03/12/19 0733 03/12/19 1237 03/12/19 1530  BP: (!) 186/77 (!) 168/73 137/70 132/60  Pulse: 79 71 81 65  Resp: 16 18 18 18   Temp: 98.5 F (36.9 C) 98.4 F (36.9 C) 98.4 F (36.9 C) 99.9 F (37.7 C)  TempSrc: Oral Oral Oral Oral  SpO2: 94% 98% 95% 96%  Weight:      Height:        General: Pt is alert, awake, not in acute distress Cardiovascular: RRR, S1/S2 +, no rubs, no gallops Respiratory: CTA bilaterally, no wheezing,  no rhonchi Abdominal: Soft, NT, ND, bowel sounds + Extremities: no edema, no cyanosis    The results of significant diagnostics from this hospitalization (including imaging, microbiology, ancillary and laboratory) are listed below for reference.     Microbiology: Recent Results (from the past 240 hour(s))  SARS Coronavirus 2 [facility] order, Performed in [facility] hospital lab) Nasopharyngeal Nasopharyngeal Swab     Status: None   Collection Time: 03/06/19  9:40 PM   Specimen: Nasopharyngeal Swab  Result Value Ref Range Status   SARS Coronavirus 2 NEGATIVE NEGATIVE Final    Comment: (NOTE) If result is NEGATIVE SARS-CoV-2 target nucleic acids are NOT DETECTED. The SARS-CoV-2 RNA is generally detectable in upper and lower  respiratory specimens during the acute phase of infection. The lowest  concentration of SARS-CoV-2 viral copies this assay can detect is 250  copies / mL. A negative result does not preclude SARS-CoV-2 infection  and should not be used as the sole basis for treatment or other  patient management decisions.  A negative result may occur with  improper specimen collection / handling, submission of specimen other  than nasopharyngeal swab, presence of viral mutation(s) within the  areas targeted by this assay, and inadequate number of viral copies  (<250 copies / mL). A negative result must be combined with clinical  observations, patient history, and epidemiological information. If result is POSITIVE SARS-CoV-2 target nucleic acids are DETECTED. The SARS-CoV-2 RNA is generally detectable in upper and lower  respiratory specimens dur ing the acute phase of infection.  Positive  results are indicative of active infection with SARS-CoV-2.  Clinical  correlation with patient history and other diagnostic information is  necessary to determine patient infection status.  Positive results do  not rule out bacterial infection or co-infection with other viruses. If  result is PRESUMPTIVE POSTIVE SARS-CoV-2 nucleic acids MAY BE PRESENT.   A presumptive positive result was obtained on the submitted specimen  and confirmed on repeat testing.  While 2019 novel coronavirus  (SARS-CoV-2) nucleic acids may be present in the submitted sample  additional confirmatory testing may be necessary for epidemiological  and / or clinical management purposes  to differentiate between  SARS-CoV-2  and other Sarbecovirus currently known to infect humans.  If clinically indicated additional testing with an alternate test  methodology [phone]) is advised. The SARS-CoV-2 RNA is generally  detectable in upper and lower respiratory sp ecimens during the acute  phase of infection. The expected result is Negative. Fact Sheet for Patients:  StrictlyIdeas.no Fact Sheet for Healthcare Providers: BankingDealers.co.za This test is not yet approved or cleared by the [place] FDA and has been authorized for detection and/or diagnosis of SARS-CoV-2 by FDA under an Emergency Use Authorization (EUA).  This EUA will remain in effect (meaning this test can be used) for the duration of the COVID-19 declaration under Section 564(b)(1) of the Act, 21 U.S.C. section 360bbb-3(b)(1), unless the authorization is terminated or revoked sooner. Performed at [facility], [address] [place], [place] [address]      Labs: BNP (last 3 results) No results for input(s): BNP in the last 8760 hours. Basic Metabolic Panel: Recent Labs  Lab 03/06/19 2132 03/06/19 2137 03/10/19 0449 03/11/19 0340 03/12/19 0306  NA 139 140 137 139 137  K 4.1 4.0 3.2* 3.7 3.3*  CL 102 100 105 112* 105  CO2 30  --  24 22 24   GLUCOSE 174* 168* 154* 151* 124*  BUN 13 14 7* <5* 6*  CREATININE 0.90 1.00 0.77 0.72 0.81  CALCIUM 9.0  --  8.3* 8.5* 8.4*  MG  --   --  1.8 2.0 1.9   Liver Function Tests: Recent Labs  Lab 03/06/19 2132  AST 17  ALT 15   ALKPHOS 89  BILITOT 1.0  PROT 7.2  ALBUMIN 4.0   No results for input(s): LIPASE, AMYLASE in the last 168 hours. No results for input(s): AMMONIA in the last 168 hours. CBC: Recent Labs  Lab 03/06/19 2133  03/08/19 0334 03/09/19 0655 03/10/19 0449 03/11/19 0340 03/12/19 0306  WBC 6.5  --  6.6 5.4 4.5 3.8* 4.3  NEUTROABS 3.9  --   --   --   --   --   --   HGB 14.2   < > 13.3 12.2 11.9* 11.0* 11.1*  HCT 42.6   < > 37.9 35.3* 34.3* 31.5* 32.3*  MCV 87.5  --  85.7 85.5 85.5 85.1 86.4  PLT 171  --  146* 126* 120* 114* 115*   < > = values in this interval not displayed.   Cardiac Enzymes: No results for input(s): CKTOTAL, CKMB, CKMBINDEX, TROPONINI in the last 168 hours. BNP: Invalid input(s): POCBNP CBG: Recent Labs  Lab 03/11/19 1741 03/11/19 2115 03/12/19 0603 03/12/19 1129 03/12/19 1645  GLUCAP 109* 118* 126* 137* 109*   D-Dimer No results for input(s): DDIMER in the last 72 hours. Hgb A1c No results for input(s): HGBA1C in the last 72 hours. Lipid Profile No results for input(s): CHOL, HDL, LDLCALC, TRIG, CHOLHDL, LDLDIRECT in the last 72 hours. Thyroid function studies No results for input(s): TSH, T4TOTAL, T3FREE, THYROIDAB in the last 72 hours.  Invalid input(s): FREET3 Anemia work up No results for input(s): VITAMINB12, FOLATE, FERRITIN, TIBC, IRON, RETICCTPCT in the last 72 hours. Urinalysis    Component Value Date/Time   COLORURINE YELLOW 03/06/2019 2326   APPEARANCEUR CLEAR 03/06/2019 2326   LABSPEC 1.009 03/06/2019 2326   PHURINE 8.0 03/06/2019 2326   GLUCOSEU NEGATIVE 03/06/2019 2326   HGBUR MODERATE (A) 03/06/2019 2326   BILIRUBINUR NEGATIVE 03/06/2019 2326   BILIRUBINUR n 01/29/2018 [place] 03/06/2019 2326   PROTEINUR NEGATIVE 03/06/2019 2326  UROBILINOGEN 0.2 01/29/2018 1029   UROBILINOGEN 0.2 05/04/2015 2308   NITRITE NEGATIVE 03/06/2019 2326   LEUKOCYTESUR NEGATIVE 03/06/2019 2326   Sepsis Labs Invalid input(s):  PROCALCITONIN,  WBC,  LACTICIDVEN Microbiology Recent Results (from the past 240 hour(s))  SARS Coronavirus 2 [facility] order, Performed in [facility] hospital lab) Nasopharyngeal Nasopharyngeal Swab     Status: None   Collection Time: 03/06/19  9:40 PM   Specimen: Nasopharyngeal Swab  Result Value Ref Range Status   SARS Coronavirus 2 NEGATIVE NEGATIVE Final    Comment: (NOTE) If result is NEGATIVE SARS-CoV-2 target nucleic acids are NOT DETECTED. The SARS-CoV-2 RNA is generally detectable in upper and lower  respiratory specimens during the acute phase of infection. The lowest  concentration of SARS-CoV-2 viral copies this assay can detect is 250  copies / mL. A negative result does not preclude SARS-CoV-2 infection  and should not be used as the sole basis for treatment or other  patient management decisions.  A negative result may occur with  improper specimen collection / handling, submission of specimen other  than nasopharyngeal swab, presence of viral mutation(s) within the  areas targeted by this assay, and inadequate number of viral copies  (<250 copies / mL). A negative result must be combined with clinical  observations, patient history, and epidemiological information. If result is POSITIVE SARS-CoV-2 target nucleic acids are DETECTED. The SARS-CoV-2 RNA is generally detectable in upper and lower  respiratory specimens dur ing the acute phase of infection.  Positive  results are indicative of active infection with SARS-CoV-2.  Clinical  correlation with patient history and other diagnostic information is  necessary to determine patient infection status.  Positive results do  not rule out bacterial infection or co-infection with other viruses. If result is PRESUMPTIVE POSTIVE SARS-CoV-2 nucleic acids MAY BE PRESENT.   A presumptive positive result was obtained on the submitted specimen  and confirmed on repeat testing.  While 2019 novel coronavirus  (SARS-CoV-2)  nucleic acids may be present in the submitted sample  additional confirmatory testing may be necessary for epidemiological  and / or clinical management purposes  to differentiate between  SARS-CoV-2 and other Sarbecovirus currently known to infect humans.  If clinically indicated additional testing with an alternate test  methodology [phone]) is advised. The SARS-CoV-2 RNA is generally  detectable in upper and lower respiratory sp ecimens during the acute  phase of infection. The expected result is Negative. Fact Sheet for Patients:  StrictlyIdeas.no Fact Sheet for Healthcare Providers: BankingDealers.co.za This test is not yet approved or cleared by the [place] FDA and has been authorized for detection and/or diagnosis of SARS-CoV-2 by FDA under an Emergency Use Authorization (EUA).  This EUA will remain in effect (meaning this test can be used) for the duration of the COVID-19 declaration under Section 564(b)(1) of the Act, 21 U.S.C. section 360bbb-3(b)(1), unless the authorization is terminated or revoked sooner. Performed at [facility], [address], [place], [place] [address]      Time coordinating discharge: 36  minutes  SIGNED:   Hosie Poisson, MD  [facility] 03/12/2019, 4:53 PM Pager   If 7PM-7AM, please contact night-coverage [email or web address] Password [email or web address]

## 2019-03-13 ENCOUNTER — Telehealth: Payer: Self-pay | Admitting: *Deleted

## 2019-03-13 NOTE — Telephone Encounter (Signed)
Transition Care Management Follow-up Telephone Call   Date discharged? 03/12/2019   How have you been since you were released from the hospital? I've been alright. I just got home yesterday."    Do you understand why you were in the hospital? yes   Do you understand the discharge instructions? yes   Where were you discharged to? Home    Items Reviewed:  Medications reviewed: yes  Allergies reviewed: yes  Dietary changes reviewed: yes ( Heart Healthy dysphagia  W/ nectar thick liquids)   Referrals reviewed: yes (Neurology)    Functional Questionnaire:   Activities of Daily Living (ADLs):   She states they are independent in the following: continence and toileting States they require assistance with the following: ambulation, bathing and hygiene, feeding, grooming and dressing   Any transportation issues/concerns?: no   Any patient concerns? no   Confirmed importance and date/time of follow-up visits scheduled yes   Provider Appointment booked with Dorothyann Peng, NP 03/18/2019 at 11AM.   Confirmed with patient if condition begins to worsen call PCP or go to the ER.  Patient was given the office number and encouraged to call back with question or concerns.  : yes

## 2019-03-15 ENCOUNTER — Telehealth: Payer: Self-pay

## 2019-03-15 ENCOUNTER — Emergency Department (HOSPITAL_COMMUNITY)
Admission: EM | Admit: 2019-03-15 | Discharge: 2019-03-15 | Disposition: A | Discharge status: Home / Self Care | Payer: Medicare Other | Attending: Emergency Medicine | Admitting: Emergency Medicine

## 2019-03-15 ENCOUNTER — Encounter (HOSPITAL_COMMUNITY): Payer: Self-pay | Admitting: Emergency Medicine

## 2019-03-15 ENCOUNTER — Other Ambulatory Visit: Payer: Self-pay

## 2019-03-15 DIAGNOSIS — D509 Iron deficiency anemia, unspecified: Secondary | ICD-10-CM | POA: Diagnosis not present

## 2019-03-15 DIAGNOSIS — C160 Malignant neoplasm of cardia: Secondary | ICD-10-CM | POA: Diagnosis not present

## 2019-03-15 DIAGNOSIS — G8929 Other chronic pain: Secondary | ICD-10-CM | POA: Diagnosis not present

## 2019-03-15 DIAGNOSIS — Z79891 Long term (current) use of opiate analgesic: Secondary | ICD-10-CM | POA: Diagnosis not present

## 2019-03-15 DIAGNOSIS — Z7982 Long term (current) use of aspirin: Secondary | ICD-10-CM | POA: Insufficient documentation

## 2019-03-15 DIAGNOSIS — R51 Headache: Secondary | ICD-10-CM | POA: Diagnosis not present

## 2019-03-15 DIAGNOSIS — R1312 Dysphagia, oropharyngeal phase: Secondary | ICD-10-CM | POA: Diagnosis not present

## 2019-03-15 DIAGNOSIS — I5032 Chronic diastolic (congestive) heart failure: Secondary | ICD-10-CM | POA: Diagnosis not present

## 2019-03-15 DIAGNOSIS — I4891 Unspecified atrial fibrillation: Secondary | ICD-10-CM | POA: Insufficient documentation

## 2019-03-15 DIAGNOSIS — Z7901 Long term (current) use of anticoagulants: Secondary | ICD-10-CM | POA: Diagnosis not present

## 2019-03-15 DIAGNOSIS — K219 Gastro-esophageal reflux disease without esophagitis: Secondary | ICD-10-CM | POA: Diagnosis not present

## 2019-03-15 DIAGNOSIS — K449 Diaphragmatic hernia without obstruction or gangrene: Secondary | ICD-10-CM | POA: Diagnosis not present

## 2019-03-15 DIAGNOSIS — R791 Abnormal coagulation profile: Secondary | ICD-10-CM | POA: Diagnosis not present

## 2019-03-15 DIAGNOSIS — I69328 Other speech and language deficits following cerebral infarction: Secondary | ICD-10-CM | POA: Diagnosis not present

## 2019-03-15 DIAGNOSIS — I69398 Other sequelae of cerebral infarction: Secondary | ICD-10-CM | POA: Diagnosis not present

## 2019-03-15 DIAGNOSIS — R040 Epistaxis: Secondary | ICD-10-CM | POA: Insufficient documentation

## 2019-03-15 DIAGNOSIS — I480 Paroxysmal atrial fibrillation: Secondary | ICD-10-CM | POA: Diagnosis not present

## 2019-03-15 DIAGNOSIS — E782 Mixed hyperlipidemia: Secondary | ICD-10-CM | POA: Diagnosis not present

## 2019-03-15 DIAGNOSIS — M549 Dorsalgia, unspecified: Secondary | ICD-10-CM | POA: Diagnosis not present

## 2019-03-15 DIAGNOSIS — I350 Nonrheumatic aortic (valve) stenosis: Secondary | ICD-10-CM | POA: Diagnosis not present

## 2019-03-15 DIAGNOSIS — R208 Other disturbances of skin sensation: Secondary | ICD-10-CM | POA: Diagnosis not present

## 2019-03-15 DIAGNOSIS — I110 Hypertensive heart disease with heart failure: Secondary | ICD-10-CM | POA: Diagnosis not present

## 2019-03-15 DIAGNOSIS — Z79899 Other long term (current) drug therapy: Secondary | ICD-10-CM | POA: Insufficient documentation

## 2019-03-15 DIAGNOSIS — M1990 Unspecified osteoarthritis, unspecified site: Secondary | ICD-10-CM | POA: Diagnosis not present

## 2019-03-15 DIAGNOSIS — Z8501 Personal history of malignant neoplasm of esophagus: Secondary | ICD-10-CM | POA: Diagnosis not present

## 2019-03-15 DIAGNOSIS — Z86711 Personal history of pulmonary embolism: Secondary | ICD-10-CM | POA: Diagnosis not present

## 2019-03-15 DIAGNOSIS — I69391 Dysphagia following cerebral infarction: Secondary | ICD-10-CM | POA: Diagnosis not present

## 2019-03-15 DIAGNOSIS — I259 Chronic ischemic heart disease, unspecified: Secondary | ICD-10-CM | POA: Diagnosis not present

## 2019-03-15 DIAGNOSIS — I2510 Atherosclerotic heart disease of native coronary artery without angina pectoris: Secondary | ICD-10-CM | POA: Diagnosis not present

## 2019-03-15 DIAGNOSIS — J90 Pleural effusion, not elsewhere classified: Secondary | ICD-10-CM | POA: Diagnosis not present

## 2019-03-15 LAB — BASIC METABOLIC PANEL
Anion gap: 9 (ref 5–15)
BUN: 16 mg/dL (ref 8–23)
CO2: 29 mmol/L (ref 22–32)
Calcium: 8.9 mg/dL (ref 8.9–10.3)
Chloride: 102 mmol/L (ref 98–111)
Creatinine, Ser: 0.94 mg/dL (ref 0.44–1.00)
GFR calc Af Amer: >60 mL/min (ref >60)
GFR calc non Af Amer: 58 mL/min — ABNORMAL LOW (ref >60)
Glucose, Bld: 161 mg/dL — ABNORMAL HIGH (ref 70–99)
Potassium: 3.8 mmol/L (ref 3.5–5.1)
Sodium: 140 mmol/L (ref 135–145)

## 2019-03-15 LAB — CBC
HCT: 40.5 % (ref 36.0–46.0)
Hemoglobin: 13.4 g/dL (ref 12.0–15.0)
MCH: 29.3 pg (ref 26.0–34.0)
MCHC: 33.1 g/dL (ref 30.0–36.0)
MCV: 88.6 fL (ref 80.0–100.0)
Platelets: 204 10*3/uL (ref 150–400)
RBC: 4.57 MIL/uL (ref 3.87–5.11)
RDW: 12.9 % (ref 11.5–15.5)
WBC: 6.2 10*3/uL (ref 4.0–10.5)
nRBC: 0 % (ref 0.0–0.2)

## 2019-03-15 MED ORDER — BACITRACIN ZINC 500 UNIT/GM EX OINT
TOPICAL_OINTMENT | Freq: Once | CUTANEOUS | Status: AC
  Administered 2019-03-15: 17:00:00 via TOPICAL
  Filled 2019-03-15: qty 3.6

## 2019-03-15 NOTE — ED Triage Notes (Signed)
Pt c/o nosebleed that began around about an hour ago. Pt is on blood thinners. Pt has had 4-5 nosebleeds in the past 24 hours.

## 2019-03-15 NOTE — Discharge Instructions (Addendum)
Keep the nose Rhino Rocket packing in place until seen by ear nose and throat.  Call on Monday to set up an appointment with Dr. Benjamine Mola.  If the bleeding starts to recur.  You can inflate the balloon in the nose packing with the syringe provided.  Would add about an additional 1 mL of air.  Return for any new or worse symptoms.  Lab work was fine.

## 2019-03-15 NOTE — Telephone Encounter (Signed)
Christy from [facility] is calling concerned about Ms Reis having recurrent nose bleed since yesterday x 4. She was recently discharged from hospital,03/12/19, s/p CVA with severe dysphagia. She has  hx of atrial fib,CHF,HLD,and and HTN. She is on Eliquix 5 mg bid (changed from Coumadin) and Aspirin 81 mg daily.  BP 118/60 HR: 80/min irregular. Pulse O2 97% and temp 98.2 orally.  She has not had headache, MS changes, gum bleeding, chest pain, dyspnea, abdominal pain, nausea, vomiting, blood in the stool,gross hematuria, or more bruising than usual. No hx of allergic rhinitis or nose trauma.  Nose bleeds have been mild and easy to stop with applying pressure.  Recommendation: We discussed pros and cons of anticoagulation therapy.  Because nose bleeds are mild and no other symptom or sign that suggest other areas of bleeding + hight risk for CVA, I recommend continuing Eliquis 5 mg twice daily on Aspirin 81 mg daily. Monitor for other signs of bleeding,worsening nose bleed, headache, or MS changes.  Avoid blowing nose or other activities that may increase risk of nose bleed. Clearly instructed about warning signs, caller voices understanding.  Betty Martinique, MD

## 2019-03-15 NOTE — Telephone Encounter (Signed)
Kristy @ [facility] called to report pt has had 4 nosebleeds since discharge from hospital.   Dr. Martinique to speak with her directly.

## 2019-03-15 NOTE — ED Provider Notes (Signed)
[facility] Provider Note   CSN: [id number] Arrival date & time: 03/15/19  1623     History   Chief Complaint Chief Complaint  Patient presents with  . Epistaxis    HPI [name] is a 78 y.o. female.     Patient with onset of left-sided nosebleed about an hour prior to arrival.  No injury.  Did not fall.  Patient recently started on Eliquis following her admission for stroke on September 3.  This may be the cause of the bleeding.  Patient states she has not had any prior nosebleeds.     Past Medical History:  Diagnosis Date  . Anemia 2005   Generally microcytic, transfusions in [address], 2012, 02/2015, 05/2015  . Aortic stenosis    Moderate November 2017  . Arthritis   . Broken back 2013   Chronic back pain.   [name] CAD (coronary artery disease)    Cardiac catheterization 2014 - 80% mid RCA and 70% OM managed medically  . CHF (congestive heart failure) ([place])   . Chronic bilateral pleural effusions   . Chronic headache   . Contrast media allergy   . Diastolic heart failure ([place])   . Esophageal cancer ([place]) 05/06/2015   Adenocarcinoma GE junction  . Facial paresthesia   . GERD (gastroesophageal reflux disease)   . H. pylori infection   . H/O iron deficiency    05-06-15 iron infusion ([facility])  . HH (hiatus hernia) 2008   Large with associated erosions  . Hypertension   . Paroxysmal atrial fibrillation ([name])   . PONV (postoperative nausea and vomiting)   . Pulmonary emboli ([place]) 2008, 2012  . Stroke [facility]) 1982    Patient Active Problem List   Diagnosis Date Noted  . Stroke ([place]) 03/07/2019  . Subtherapeutic international normalized ratio (INR) 03/07/2019  . S/P left knee arthroscopy 04/18/18 06/06/2018  . Acute medial meniscus tear, left, subsequent encounter 04/18/2018  . Hypokalemia 04/15/2018  . Syncope and collapse 04/15/2018  . Pneumonia 02/18/2018  . Bilateral pleural effusion   . Generalized postprandial abdominal pain   . CAP  (community acquired pneumonia) 02/15/2018  . Orthostatic hypotension 12/21/2017  . Peripheral neuropathy 12/21/2017  . CAD (coronary artery disease) 12/19/2017  . Long term (current) use of anticoagulants 03/14/2017  . Numbness of face 01/09/2017  . Unstable angina ([place]) 11/02/2016  . Other fatigue 11/02/2016  . B12 deficiency 10/16/2016  . Preoperative cardiovascular examination 09/21/2016  . Hyperglycemia 06/26/2016  . Atrial fibrillation with RVR ([place]) 06/23/2016  . Encounter for medication review and counseling 06/07/2016  . Essential hypertension, benign 04/19/2016  . Aortic valve stenosis 09/28/2015  . GE junction carcinoma ([place]) 05/14/2015  . [place] lesion, acute   . IDA (iron deficiency anemia)   . Mixed hyperlipidemia 05/05/2015  . GERD (gastroesophageal reflux disease) 05/05/2015  . Absolute anemia   . Anticoagulated on Coumadin   . Osteoarthritis 04/16/2015  . History of pulmonary embolism 03/02/2015  . Shortness of breath 02/22/2015  . Benign neoplasm of descending colon 02/17/2015  . Diverticulosis of large intestine without diverticulitis 02/17/2015  . Esophageal stricture 02/16/2015  . UGI bleed 02/13/2015  . Supratherapeutic INR 02/13/2015  . Thoracic back pain 10/15/2014  . Macular degeneration 09/01/2014  . Benign paroxysmal positional vertigo   . Uncontrolled hypertension 08/24/2014  . Complaints of total body pain 08/03/2014  . Sleep disturbance 08/03/2014  . PAF (paroxysmal atrial fibrillation) ([place]) 01/21/2014  . Long term current use of anticoagulant therapy  01/21/2014  . Chronic diastolic congestive heart failure ([place]) 01/21/2014  . Chest pain 01/21/2014    Past Surgical History:  Procedure Laterality Date  . APPENDECTOMY  1950s  . BACK SURGERY    . CARPAL TUNNEL RELEASE Bilateral 1990s  . [place] SURGERY  2014  . CHOLECYSTECTOMY  1980s   open  . COLONOSCOPY N/A 02/17/2015   Procedure: COLONOSCOPY;  Surgeon: [name], MD;   Location: [facility] ENDOSCOPY;  Service: Endoscopy;  Laterality: N/A;  . ESOPHAGOGASTRODUODENOSCOPY N/A 02/16/2015   Procedure: ESOPHAGOGASTRODUODENOSCOPY (EGD);  Surgeon: [name], MD;  Location: [facility];  Service: Endoscopy;  Laterality: N/A;  . ESOPHAGOGASTRODUODENOSCOPY N/A 05/06/2015   Procedure: ESOPHAGOGASTRODUODENOSCOPY (EGD);  Surgeon: [name], MD;  Location: [facility] ENDOSCOPY;  Service: Endoscopy;  Laterality: N/A;  . EUS N/A 05/20/2015   Procedure: UPPER ENDOSCOPIC ULTRASOUND (EUS) LINEAR;  Surgeon: [name], MD;  Location: [facility] ENDOSCOPY;  Service: Endoscopy;  Laterality: N/A;  . exploratory lab  1950s or 1960s  . GIVENS CAPSULE STUDY N/A 05/06/2015   Procedure: GIVENS CAPSULE STUDY;  Surgeon: [name], MD;  Location: [facility] ENDOSCOPY;  Service: Endoscopy;  Laterality: N/A;  . KNEE ARTHROSCOPY WITH MEDIAL MENISECTOMY Left 04/18/2018   Procedure: LEFT KNEE ARTHROSCOPY WITH PARTIAL MEDIAL MENISECTOMY AND ANTERIOR CRUCIATE LIGAMENT DEBRIDEMENT;  Surgeon: [name], MD;  Location: [facility] ORS;  Service: Orthopedics;  Laterality: Left;  . LEFT HEART CATH AND CORONARY ANGIOGRAPHY N/A 11/08/2016   Procedure: Left Heart Cath and Coronary Angiography;  Surgeon: [name], MD;  Location: [facility] CV LAB;  Service: Cardiovascular;  Laterality: N/A;  . lumbar back surgery  2012  . [place] SURGERY  1991  . TONSILLECTOMY    . TONSILLECTOMY AND ADENOIDECTOMY  1960s     OB History   No obstetric history on file.      Home Medications    Prior to Admission medications   Medication Sig Start Date End Date Taking? Authorizing Provider  acetaminophen (TYLENOL) 500 MG tablet Take 500-1,000 mg by mouth daily as needed for mild pain or moderate pain.     [provider]  amLODipine (NORVASC) 5 MG tablet Take 1 tablet (5 mg total) by mouth daily. 12/25/18   Hilty, Nadean Corwin, MD  apixaban (ELIQUIS) 5 MG TABS tablet Take 1 tablet (5 mg total) by mouth 2 (two) times  daily. 03/12/19   Hosie Poisson, MD  aspirin EC 81 MG tablet Take 1 tablet (81 mg total) by mouth daily. 11/02/16   Hilty, Nadean Corwin, MD  atorvastatin (LIPITOR) 40 MG tablet Take 1 tablet (40 mg total) by mouth daily at 6 PM. 03/12/19   Hosie Poisson, MD  carvedilol (COREG) 25 MG tablet TAKE (1) TABLET BY MOUTH TWICE DAILY WITH A MEAL. Patient taking differently: Take 25 mg by mouth 2 (two) times daily with a meal.  04/03/18   Nafziger, Tommi Rumps, NP  furosemide (LASIX) 40 MG tablet TAKE 1 TABLET BY MOUTH ONCE DAILY. Patient taking differently: Take 40 mg by mouth daily.  02/19/19   Nafziger, Tommi Rumps, NP  meclizine (ANTIVERT) 25 MG tablet Take 1 tablet (25 mg total) by mouth as needed for dizziness or nausea. 08/02/18   Nafziger, Tommi Rumps, NP  ondansetron (ZOFRAN) 4 MG tablet Take 1 tablet (4 mg total) by mouth every 8 (eight) hours as needed for nausea or vomiting. 02/19/18   Nafziger, Tommi Rumps, NP  pantoprazole (PROTONIX) 40 MG tablet TAKE 1 TABLET BY MOUTH TWICE DAILY.  Patient taking differently: Take 40 mg by mouth 2 (two) times daily.  03/04/19   Dorothyann Peng, NP  Pediatric Multivitamins-Iron (FLINTSTONES PLUS IRON) chewable tablet Chew 2 tablets by mouth daily. 05/30/16   Samuella Cota, MD  temazepam (RESTORIL) 15 MG capsule TAKE (1) CAPSULE BY MOUTH AT BEDTIME. Patient taking differently: Take 15 mg by mouth at bedtime.  02/19/19   Nafziger, Tommi Rumps, NP  traMADol (ULTRAM) 50 MG tablet TAKE (1) TABLET BY MOUTH EVERY EIGHT HOURS. Patient taking differently: Take 50 mg by mouth every 8 (eight) hours.  02/19/19   Nafziger, Tommi Rumps, NP  triamcinolone cream (KENALOG) 0.1 % Apply 1 application topically 2 (two) times daily. 11/18/18   Burchette, Alinda Sierras, MD    Family History Family History  Problem Relation Age of Onset  . Stroke Mother   . Heart disease Mother   . Emphysema Father   . Ovarian cancer Sister   . Stroke Sister   . Other Child        died at birth    Social History Social History   Tobacco Use  .  Smoking status: Never Smoker  . Smokeless tobacco: Never Used  Substance Use Topics  . Alcohol use: No    Alcohol/week: 0.0 standard drinks  . Drug use: No     Allergies   Iodinated diagnostic agents, Ioxaglate, Red dye, Lactose intolerance (gi), Whey, Darvon [propoxyphene], Hydralazine, and Milk-related compounds   Review of Systems Review of Systems  Constitutional: Negative for chills and fever.  HENT: Positive for nosebleeds. Negative for congestion, rhinorrhea and sore throat.   Eyes: Negative for visual disturbance.  Respiratory: Negative for cough and shortness of breath.   Cardiovascular: Negative for chest pain and leg swelling.  Gastrointestinal: Negative for abdominal pain, diarrhea, nausea and vomiting.  Genitourinary: Negative for dysuria.  Musculoskeletal: Negative for back pain and neck pain.  Skin: Negative for rash.  Neurological: Negative for dizziness, light-headedness and headaches.  Hematological: Bruises/bleeds easily.  Psychiatric/Behavioral: Negative for confusion.     Physical Exam Updated Vital Signs BP (!) 155/72   Pulse 82   Temp 97.9 F (36.6 C) (Temporal)   Resp 16   Ht 1.575 m (5\' 2" )   Wt 49.4 kg   SpO2 97%   BMI 19.94 kg/m   Physical Exam Vitals signs and nursing note reviewed.  Constitutional:      General: She is not in acute distress.    Appearance: Normal appearance. She is well-developed.  HENT:     Head: Normocephalic and atraumatic.     Nose:     Comments: Bleeding from the anterior aspect of the left nares.  Some of his draining down the back of the throat.  Nose pinching and a trial of Afrin did not slow it down.  Right side is clear. Eyes:     Extraocular Movements: Extraocular movements intact.     Conjunctiva/sclera: Conjunctivae normal.     Pupils: Pupils are equal, round, and reactive to light.  Neck:     Musculoskeletal: Neck supple.  Cardiovascular:     Rate and Rhythm: Normal rate and regular rhythm.      Heart sounds: No murmur.  Pulmonary:     Effort: Pulmonary effort is normal. No respiratory distress.     Breath sounds: Normal breath sounds.  Abdominal:     Palpations: Abdomen is soft.     Tenderness: There is no abdominal tenderness.  Skin:    General: Skin is warm and  dry.  Neurological:     Mental Status: She is alert. Mental status is at baseline.      ED Treatments / Results  Labs (all labs ordered are listed, but only abnormal results are displayed) Labs Reviewed  BASIC METABOLIC PANEL - Abnormal; Notable for the following components:      Result Value   Glucose, Bld 161 (*)    GFR calc non Af Amer 58 (*)    All other components within normal limits  CBC    EKG None  Radiology No results found.  Procedures .Epistaxis Management  Date/Time: 03/15/2019 6:35 PM Performed by: Fredia Sorrow, MD Authorized by: Fredia Sorrow, MD   Consent:    Consent obtained:  Verbal   Consent given by:  Patient   Risks discussed:  Bleeding   Alternatives discussed:  No treatment and observation Anesthesia (see MAR for exact dosages):    Anesthesia method:  None Procedure details:    Treatment site:  L anterior   Treatment method:  Nasal tampon   Treatment episode: initial   Post-procedure details:    Assessment:  Bleeding stopped   Patient tolerance of procedure:  Tolerated well, no immediate complications   (including critical care time)  Medications Ordered in ED Medications  bacitracin ointment ( Topical Given 03/15/19 1700)     Initial Impression / Assessment and Plan / ED Course  I have reviewed the triage vital signs and the nursing notes.  Pertinent labs & imaging results that were available during my care of the patient were reviewed by me and considered in my medical decision making (see chart for details).        Rhino Rocket placed left nares.  Bleeding stopped.  Patient tolerated procedure well.  It was coated with bacitracin ointment prior  to placement.  Patient will be given referral to follow-up with ear nose and throat locally.  Patient's CBC and basic electrolytes without significant abnormality.  Final Clinical Impressions(s) / ED Diagnoses   Final diagnoses:  Left-sided epistaxis    ED Discharge Orders    None       Fredia Sorrow, MD 03/15/19 [phone]

## 2019-03-17 ENCOUNTER — Telehealth: Payer: Self-pay | Admitting: Adult Health

## 2019-03-17 ENCOUNTER — Ambulatory Visit: Payer: Medicare Other

## 2019-03-17 DIAGNOSIS — R208 Other disturbances of skin sensation: Secondary | ICD-10-CM | POA: Diagnosis not present

## 2019-03-17 DIAGNOSIS — I69328 Other speech and language deficits following cerebral infarction: Secondary | ICD-10-CM | POA: Diagnosis not present

## 2019-03-17 DIAGNOSIS — R1312 Dysphagia, oropharyngeal phase: Secondary | ICD-10-CM | POA: Diagnosis not present

## 2019-03-17 DIAGNOSIS — I69391 Dysphagia following cerebral infarction: Secondary | ICD-10-CM | POA: Diagnosis not present

## 2019-03-17 DIAGNOSIS — I480 Paroxysmal atrial fibrillation: Secondary | ICD-10-CM | POA: Diagnosis not present

## 2019-03-17 DIAGNOSIS — I69398 Other sequelae of cerebral infarction: Secondary | ICD-10-CM | POA: Diagnosis not present

## 2019-03-17 NOTE — Telephone Encounter (Signed)
Home Health Verbal Orders - Caller/Agency: [facility] Callback Number: [phone] Requesting OT/PT/Skilled Nursing/Social Work/Speech Therapy: OT Frequency: 1x1wk, 2x3wk

## 2019-03-17 NOTE — Telephone Encounter (Signed)
Home Health Verbal Orders - Caller/AgencyDerrill Memo Number: [phone] Requesting OT/PT/Skilled Nursing/Social Work/Speech Therapy: Speech Therapy Frequency: 2w 4  To address swallowing and speech accountabilities. Can take a verbal if you have no question.

## 2019-03-18 ENCOUNTER — Ambulatory Visit (INDEPENDENT_AMBULATORY_CARE_PROVIDER_SITE_OTHER): Payer: Medicare Other | Admitting: Adult Health

## 2019-03-18 ENCOUNTER — Encounter: Payer: Self-pay | Admitting: Adult Health

## 2019-03-18 ENCOUNTER — Other Ambulatory Visit: Payer: Self-pay

## 2019-03-18 ENCOUNTER — Telehealth: Payer: Self-pay | Admitting: Adult Health

## 2019-03-18 VITALS — BP 130/90 | HR 82 | Temp 97.8°F | Wt 103.6 lb

## 2019-03-18 DIAGNOSIS — R040 Epistaxis: Secondary | ICD-10-CM | POA: Diagnosis not present

## 2019-03-18 DIAGNOSIS — Z8673 Personal history of transient ischemic attack (TIA), and cerebral infarction without residual deficits: Secondary | ICD-10-CM | POA: Diagnosis not present

## 2019-03-18 DIAGNOSIS — I10 Essential (primary) hypertension: Secondary | ICD-10-CM

## 2019-03-18 DIAGNOSIS — I4891 Unspecified atrial fibrillation: Secondary | ICD-10-CM | POA: Diagnosis not present

## 2019-03-18 DIAGNOSIS — E782 Mixed hyperlipidemia: Secondary | ICD-10-CM

## 2019-03-18 DIAGNOSIS — Z760 Encounter for issue of repeat prescription: Secondary | ICD-10-CM

## 2019-03-18 DIAGNOSIS — I480 Paroxysmal atrial fibrillation: Secondary | ICD-10-CM

## 2019-03-18 DIAGNOSIS — I63411 Cerebral infarction due to embolism of right middle cerebral artery: Secondary | ICD-10-CM

## 2019-03-18 LAB — CBC WITH DIFFERENTIAL/PLATELET
Basophils Absolute: 0 10*3/uL (ref 0.0–0.1)
Basophils Relative: 0.4 % (ref 0.0–3.0)
Eosinophils Absolute: 0 10*3/uL (ref 0.0–0.7)
Eosinophils Relative: 0.6 % (ref 0.0–5.0)
HCT: 40.4 % (ref 36.0–46.0)
Hemoglobin: 14.2 g/dL (ref 12.0–15.0)
Lymphocytes Relative: 26.1 % (ref 12.0–46.0)
Lymphs Abs: 2.1 10*3/uL (ref 0.7–4.0)
MCHC: 35.1 g/dL (ref 30.0–36.0)
MCV: 86.5 fl (ref 78.0–100.0)
Monocytes Absolute: 0.6 10*3/uL (ref 0.1–1.0)
Monocytes Relative: 7 % (ref 3.0–12.0)
Neutro Abs: 5.3 10*3/uL (ref 1.4–7.7)
Neutrophils Relative %: 65.9 % (ref 43.0–77.0)
Platelets: 248 10*3/uL (ref 150.0–400.0)
RBC: 4.67 Mil/uL (ref 3.87–5.11)
RDW: 13.2 % (ref 11.5–15.5)
WBC: 8 10*3/uL (ref 4.0–10.5)

## 2019-03-18 LAB — BASIC METABOLIC PANEL
BUN: 22 mg/dL (ref 6–23)
CO2: 34 mEq/L — ABNORMAL HIGH (ref 19–32)
Calcium: 10.3 mg/dL (ref 8.4–10.5)
Chloride: 96 mEq/L (ref 96–112)
Creatinine, Ser: 1.14 mg/dL (ref 0.40–1.20)
GFR: 45.91 mL/min — ABNORMAL LOW (ref >60.00)
Glucose, Bld: 168 mg/dL — ABNORMAL HIGH (ref 70–99)
Potassium: 4.2 mEq/L (ref 3.5–5.1)
Sodium: 141 mEq/L (ref 135–145)

## 2019-03-18 MED ORDER — TEMAZEPAM 15 MG PO CAPS: ORAL_CAPSULE | ORAL | 2 refills | Status: DC

## 2019-03-18 NOTE — Telephone Encounter (Signed)
Verbal orders given to Jennifer. 

## 2019-03-18 NOTE — Telephone Encounter (Signed)
Ok for verbal orders ?

## 2019-03-18 NOTE — Telephone Encounter (Signed)
Okay to order?

## 2019-03-18 NOTE — Telephone Encounter (Signed)
Christy calling with [facility] with orders for Skilled Nursing for  1 time week 1, 2 times wk 2 and 1 time for 7 wk Please leave verbal at [phone]

## 2019-03-18 NOTE — Telephone Encounter (Signed)
Copied from [place] [phone]. Topic: Quick Communication - Home Health Verbal Orders >> Mar 18, 2019  9:57 AM Jodie Echevaria wrote: Caller/Agency: Amy / [facility] Number: [phone] ok to LM Requesting OT/PT/Skilled Nursing/Social Work/Speech Therapy: PT  Frequency: 2 xw 3 weeks

## 2019-03-19 NOTE — Progress Notes (Signed)
Subjective:    Patient ID: [name], female    DOB: 1941/02/04, 78 y.o.   MRN: [id number]  HPI 78 year old female who  has a past medical history of Anemia (2005), Aortic stenosis, Arthritis, Broken back (2013), CAD (coronary artery disease), CHF (congestive heart failure) ([place]), Chronic bilateral pleural effusions, Chronic headache, Contrast media allergy, Diastolic heart failure ([place]), Esophageal cancer ([place]) (05/06/2015), Facial paresthesia, GERD (gastroesophageal reflux disease), H. pylori infection, H/O iron deficiency, HH (hiatus hernia) (2008), Hypertension, Paroxysmal atrial fibrillation ([place]), PONV (postoperative nausea and vomiting), Pulmonary emboli ([place]) (2008, 2012), and Stroke [facility]) (1982).  She presents with family for TCM visit   Admit Date: 03/06/2019 Discharge Date: 03/12/2019  She was brought to the emergency room with a complaint of difficulty speaking.  MRI of the brain revealed a right MCA infarct with occlusion of M2.  Neurology recommended transition Coumadin to NOAC on discharge.  The patient had significant dysphasia on admission which had improved and she is currently on dysphagia 1 diet.  Hospital Course  1. Acute right MCA infarct  -With speech impairment, dysphasia, and right-sided facial numbness -MRI of the brain and MRA of the head and neck showed right MCA infarct involving right frontal operculum, with right M2 occlusion.  Neurology recommended Eliquis on discharge along with aspirin. -Echocardiogram showed preserved left ventricular ejection fraction with normal wall motion and moderate aortic stenosis -Recommend home health PT and OT on discharge  2.  Severe dysphasia from acute CVA -Proved to while in the hospital.  She was able to tolerate dysphagia 1 diet with nectar thick liquids upon discharge  3.  Chronic diastolic heart failure -Patient was euvolemic and compensated during hospital admission  4.  Paroxysmal atrial fibrillation -Controlled  with Coreg, patient now on Eliquis  5.  Essential hypertension -Is well controlled throughout hospital admission -Vytorin was discontinued as well as potassium supplement  6.  GERD -Continue with PPI  7.  Hyperlipidemia -Goal LDL less than 70, continue with Lipitor 40 mg daily  Externally after she was discharged from the hospital about 3 days later she had a left-sided nosebleed that lasted about an hour prior to arrival.  There was no trauma involved.  Rhino Rocket was placed in the left nare and bleeding was able to be controlled.  She was given a referral to ear nose and throat to have the Rhino Rocket removed.  Today in the office she reports she feels as though she is improving slightly.  She continues to have mild speech impairment, difficulty swallowing, and some mild right sided facial numbness.  PT/OT/speech therapy are coming into the home and she finds her services very helpful.  She is walking with a rolling walker and has no difficulty with ambulation.  Throughout her exam she is tearful at times.  Family members at her with her have noticed improvement since being discharged from the hospital especially when it comes to her speech.  She denies chest pain, shortness of breath, headaches, blurred vision, or recent falls.  Rhino Rocket is in place   Review of Systems  Constitutional: Positive for activity change.  HENT: Positive for nosebleeds and trouble swallowing.   Respiratory: Negative.   Cardiovascular: Negative.   Gastrointestinal: Negative.   Genitourinary: Negative.   Musculoskeletal: Positive for gait problem.  Skin: Negative.   Psychiatric/Behavioral: Negative.   All other systems reviewed and are negative.  Past Medical History:  Diagnosis Date  . Anemia 2005   Generally microcytic,  transfusions in [address], 2012, 02/2015, 05/2015  . Aortic stenosis    Moderate November 2017  . Arthritis   . Broken back 2013   Chronic back pain.   [name] CAD (coronary artery  disease)    Cardiac catheterization 2014 - 80% mid RCA and 70% OM managed medically  . CHF (congestive heart failure) ([place])   . Chronic bilateral pleural effusions   . Chronic headache   . Contrast media allergy   . Diastolic heart failure ([place])   . Esophageal cancer ([place]) 05/06/2015   Adenocarcinoma GE junction  . Facial paresthesia   . GERD (gastroesophageal reflux disease)   . H. pylori infection   . H/O iron deficiency    05-06-15 iron infusion ([facility])  . HH (hiatus hernia) 2008   Large with associated erosions  . Hypertension   . Paroxysmal atrial fibrillation ([name])   . PONV (postoperative nausea and vomiting)   . Pulmonary emboli ([place]) 2008, 2012  . Stroke [facility]) 1982    Social History   Socioeconomic History  . Marital status: Widowed    Spouse name: Not on file  . Number of children: 3  . Years of education: 58  . Highest education level: Not on file  Occupational History  . Occupation: Retired  Scientific laboratory technician  . Financial resource strain: Not on file  . Food insecurity    Worry: Not on file    Inability: Not on file  . Transportation needs    Medical: Not on file    Non-medical: Not on file  Tobacco Use  . Smoking status: Never Smoker  . Smokeless tobacco: Never Used  Substance and Sexual Activity  . Alcohol use: No    Alcohol/week: 0.0 standard drinks  . Drug use: No  . Sexual activity: Not Currently  Lifestyle  . Physical activity    Days per week: Not on file    Minutes per session: Not on file  . Stress: Not on file  Relationships  . Social Herbalist on phone: Not on file    Gets together: Not on file    Attends religious service: Not on file    Active member of club or organization: Not on file    Attends meetings of clubs or organizations: Not on file    Relationship status: Not on file  . Intimate partner violence    Fear of current or ex partner: Not on file    Emotionally abused: Not on file    Physically abused: Not on file     Forced sexual activity: Not on file  Other Topics Concern  . Not on file  Social History Narrative   Born and raised in [place], [place]. Currently resides in a house with her son. 1 dog. Fun: go to church   Divorced(Has total of #3 children)-[place], [place], [place]   Denies religious beliefs that would effect health care.    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[employer]    Past Surgical History:  Procedure Laterality Date  . APPENDECTOMY  1950s  . BACK SURGERY    . CARPAL TUNNEL RELEASE Bilateral 1990s  . [place] SURGERY  2014  . CHOLECYSTECTOMY  1980s   open  . COLONOSCOPY N/A 02/17/2015   Procedure: COLONOSCOPY;  Surgeon: [name], MD;  Location: [facility] ENDOSCOPY;  Service: Endoscopy;  Laterality: N/A;  . ESOPHAGOGASTRODUODENOSCOPY N/A 02/16/2015   Procedure: ESOPHAGOGASTRODUODENOSCOPY (EGD);  Surgeon:  [name], MD;  Location: [facility];  Service: Endoscopy;  Laterality: N/A;  . ESOPHAGOGASTRODUODENOSCOPY N/A 05/06/2015   Procedure: ESOPHAGOGASTRODUODENOSCOPY (EGD);  Surgeon: [name], MD;  Location: [facility] ENDOSCOPY;  Service: Endoscopy;  Laterality: N/A;  . EUS N/A 05/20/2015   Procedure: UPPER ENDOSCOPIC ULTRASOUND (EUS) LINEAR;  Surgeon: [name], MD;  Location: [facility] ENDOSCOPY;  Service: Endoscopy;  Laterality: N/A;  . exploratory lab  1950s or 1960s  . GIVENS CAPSULE STUDY N/A 05/06/2015   Procedure: GIVENS CAPSULE STUDY;  Surgeon: [name], MD;  Location: [facility] ENDOSCOPY;  Service: Endoscopy;  Laterality: N/A;  . KNEE ARTHROSCOPY WITH MEDIAL MENISECTOMY Left 04/18/2018   Procedure: LEFT KNEE ARTHROSCOPY WITH PARTIAL MEDIAL MENISECTOMY AND ANTERIOR CRUCIATE LIGAMENT DEBRIDEMENT;  Surgeon: [name], MD;  Location: [facility] ORS;  Service: Orthopedics;  Laterality: Left;  . LEFT HEART CATH AND CORONARY ANGIOGRAPHY N/A 11/08/2016   Procedure: Left Heart Cath and Coronary Angiography;   Surgeon: [name], MD;  Location: [facility] CV LAB;  Service: Cardiovascular;  Laterality: N/A;  . lumbar back surgery  2012  . [place] SURGERY  1991  . TONSILLECTOMY    . TONSILLECTOMY AND ADENOIDECTOMY  1960s    Family History  Problem Relation Age of Onset  . Stroke Mother   . Heart disease Mother   . Emphysema Father   . Ovarian cancer Sister   . Stroke Sister   . Other Child        died at birth    Allergies  Allergen Reactions  . Iodinated Diagnostic Agents Anaphylaxis and Other (See Comments)    IPD dye Info given by patient  . Ioxaglate Anaphylaxis and Other (See Comments)    Info given by patient  . Red Dye Anaphylaxis  . Lactose Intolerance (Gi)   . Whey Other (See Comments)    Lactose intolerance  . Darvon [Propoxyphene] Rash  . Hydralazine Anxiety and Other (See Comments)    Facial flushing, pt prefers not to use it.   . Milk-Related Compounds Other (See Comments)    Lactose intolerance Can tolerate milk if its cooked into the recipe, just can't drink milk     Current Outpatient Medications on File Prior to Visit  Medication Sig Dispense Refill  . acetaminophen (TYLENOL) 500 MG tablet Take 500-1,000 mg by mouth daily as needed for mild pain or moderate pain.     [name] amLODipine (NORVASC) 5 MG tablet Take 1 tablet (5 mg total) by mouth daily. 90 tablet 3  . apixaban (ELIQUIS) 5 MG TABS tablet Take 1 tablet (5 mg total) by mouth 2 (two) times daily. 60 tablet 2  . aspirin EC 81 MG tablet Take 1 tablet (81 mg total) by mouth daily. 90 tablet 3  . atorvastatin (LIPITOR) 40 MG tablet Take 1 tablet (40 mg total) by mouth daily at 6 PM. 30 tablet 0  . carvedilol (COREG) 25 MG tablet TAKE (1) TABLET BY MOUTH TWICE DAILY WITH A MEAL. (Patient taking differently: Take 25 mg by mouth 2 (two) times daily with a meal. ) 180 tablet 3  . furosemide (LASIX) 40 MG tablet TAKE 1 TABLET BY MOUTH ONCE DAILY. (Patient taking differently: Take 40 mg by mouth daily. ) 90  tablet 0  . meclizine (ANTIVERT) 25 MG tablet Take 1 tablet (25 mg total) by mouth as needed for dizziness or nausea. 90 tablet 1  . ondansetron (ZOFRAN) 4 MG tablet Take 1 tablet (4 mg total)  by mouth every 8 (eight) hours as needed for nausea or vomiting. 20 tablet 0  . pantoprazole (PROTONIX) 40 MG tablet TAKE 1 TABLET BY MOUTH TWICE DAILY. (Patient taking differently: Take 40 mg by mouth 2 (two) times daily. ) 180 tablet 1  . Pediatric Multivitamins-Iron (FLINTSTONES PLUS IRON) chewable tablet Chew 2 tablets by mouth daily. 60 tablet 0  . traMADol (ULTRAM) 50 MG tablet TAKE (1) TABLET BY MOUTH EVERY EIGHT HOURS. (Patient taking differently: Take 50 mg by mouth every 8 (eight) hours. ) 90 tablet 0  . triamcinolone cream (KENALOG) 0.1 % Apply 1 application topically 2 (two) times daily. 30 g 0   Current Facility-Administered Medications on File Prior to Visit  Medication Dose Route Frequency Provider Last Rate Last Dose  . cyanocobalamin ((VITAMIN B-12)) injection 1,000 mcg  1,000 mcg Intramuscular Once Burchette, Alinda Sierras, MD        BP 130/90 (BP Location: Left Arm, Patient Position: Sitting, Cuff Size: Normal)   Pulse 82   Temp 97.8 F (36.6 C) (Temporal)   Wt 103 lb 9.6 oz (47 kg)   SpO2 94%   BMI 18.95 kg/m       Objective:   Physical Exam Vitals signs and nursing note reviewed.  Constitutional:      Appearance: She is underweight. She is ill-appearing.  HENT:     Head: Normocephalic and atraumatic.  Cardiovascular:     Rate and Rhythm: Normal rate. Rhythm irregularly irregular.     Pulses: Normal pulses.     Heart sounds: Normal heart sounds.  Pulmonary:     Effort: Pulmonary effort is normal.     Breath sounds: Normal breath sounds.  Abdominal:     General: Abdomen is flat.     Palpations: Abdomen is soft.  Neurological:     Mental Status: She is alert and oriented to person, place, and time.     Motor: Weakness present.     Coordination: Coordination abnormal.      Gait: Gait abnormal.     Deep Tendon Reflexes: Reflexes are normal and symmetric.     Comments: Mild left-sided facial droop Mild speech impairment  4/5 grip strength bilaterally 5/5 lower extremity strength bilaterally  Psychiatric:        Attention and Perception: Attention and perception normal.        Mood and Affect: Affect is tearful.        Speech: Speech is slurred.        Behavior: Behavior normal. Behavior is cooperative.        Thought Content: Thought content normal.        Cognition and Memory: Cognition and memory normal.        Judgment: Judgment normal.        Assessment & Plan:  1. PAF (paroxysmal atrial fibrillation) ([name]) - Continue with Eliquis  - CBC with Differential/Platelet - Basic Metabolic Panel  2. Cerebrovascular accident (CVA) due to embolism of right middle cerebral artery ([place]) -We reviewed hospital notes, imaging, labs, and discharge instructions.  All questions answered to the best of my ability.  Encouraged her to continue to work with PT, OT, speech therapy.  Red flags reviewed and she was advised to follow-up in the emergency room if needed. -Has a neurology appointment scheduled for next month. - CBC with Differential/Platelet - Basic Metabolic Panel  3. Mixed hyperlipidemia - LDL goal less then 70. Continue with lipitor 40 mg  - CBC with Differential/Platelet - Basic Metabolic  Panel  4. Epistaxis - Follow up with ENT for removal of rhino rocket  - CBC with Differential/Platelet - Basic Metabolic Panel  5. Essential hypertension, benign - No change in  Medication therapy  - CBC with Differential/Platelet - Basic Metabolic Panel  6. Medication refill  - temazepam (RESTORIL) 15 MG capsule; TAKE (1) CAPSULE BY MOUTH AT BEDTIME.  Dispense: 30 capsule; Refill: 2   Dorothyann Peng, NP

## 2019-03-19 NOTE — Telephone Encounter (Signed)
Verbal orders given to Jennifer. 

## 2019-03-20 DIAGNOSIS — Z7901 Long term (current) use of anticoagulants: Secondary | ICD-10-CM | POA: Diagnosis not present

## 2019-03-20 DIAGNOSIS — R040 Epistaxis: Secondary | ICD-10-CM | POA: Insufficient documentation

## 2019-03-21 DIAGNOSIS — R1312 Dysphagia, oropharyngeal phase: Secondary | ICD-10-CM | POA: Diagnosis not present

## 2019-03-21 DIAGNOSIS — I480 Paroxysmal atrial fibrillation: Secondary | ICD-10-CM | POA: Diagnosis not present

## 2019-03-21 DIAGNOSIS — I69398 Other sequelae of cerebral infarction: Secondary | ICD-10-CM | POA: Diagnosis not present

## 2019-03-21 DIAGNOSIS — I69391 Dysphagia following cerebral infarction: Secondary | ICD-10-CM | POA: Diagnosis not present

## 2019-03-21 DIAGNOSIS — R208 Other disturbances of skin sensation: Secondary | ICD-10-CM | POA: Diagnosis not present

## 2019-03-21 DIAGNOSIS — I69328 Other speech and language deficits following cerebral infarction: Secondary | ICD-10-CM | POA: Diagnosis not present

## 2019-03-24 ENCOUNTER — Other Ambulatory Visit: Payer: Self-pay

## 2019-03-24 ENCOUNTER — Telehealth: Payer: Self-pay

## 2019-03-24 DIAGNOSIS — R1312 Dysphagia, oropharyngeal phase: Secondary | ICD-10-CM | POA: Diagnosis not present

## 2019-03-24 DIAGNOSIS — I69391 Dysphagia following cerebral infarction: Secondary | ICD-10-CM | POA: Diagnosis not present

## 2019-03-24 DIAGNOSIS — I69328 Other speech and language deficits following cerebral infarction: Secondary | ICD-10-CM | POA: Diagnosis not present

## 2019-03-24 DIAGNOSIS — R208 Other disturbances of skin sensation: Secondary | ICD-10-CM | POA: Diagnosis not present

## 2019-03-24 DIAGNOSIS — I69398 Other sequelae of cerebral infarction: Secondary | ICD-10-CM | POA: Diagnosis not present

## 2019-03-24 DIAGNOSIS — I480 Paroxysmal atrial fibrillation: Secondary | ICD-10-CM | POA: Diagnosis not present

## 2019-03-24 NOTE — Patient Outreach (Signed)
[name]) Care Management  03/24/2019  [name] 26-Jan-1941 [id number]  EMMI: stroke red alert Referral date: 03/24/19 Referral reason: questions/ problems with medication Insurance: Medicare Day # 9  Outreach attempt # 1  Telephone call to patient regarding EMMI stroke red alert. HIPAA verified with patient. Patient states she is not able to talk well due to recent stroke. Patient gave verbal permission to speak with her son, [name] regarding her medical information.  RNCM attempted call to patients son, [name]. Unable to reach. HIPAA compliant voice message left with call back phone number.   PLAN:  RNCM will attempt 2nd telephone call to patients son within 4 business days.  RNCM will send outreach letter to patient.  Miranda Plowman RN,BSN,CCM [facility] Telephonic  [phone]

## 2019-03-24 NOTE — Telephone Encounter (Signed)
Copied from [place] [id number]. Topic: General - Other >> Mar 24, 2019 11:13 AM Yvette Rack wrote: Reason for CRM: Shirlee Latch with [facility] requests that pt has a modified swallow study.Bethena Roys stated that the order should be sent to [facility] @ fax# [phone]. Cb# [phone]

## 2019-03-25 DIAGNOSIS — I69398 Other sequelae of cerebral infarction: Secondary | ICD-10-CM | POA: Diagnosis not present

## 2019-03-25 DIAGNOSIS — R208 Other disturbances of skin sensation: Secondary | ICD-10-CM | POA: Diagnosis not present

## 2019-03-25 DIAGNOSIS — I69391 Dysphagia following cerebral infarction: Secondary | ICD-10-CM | POA: Diagnosis not present

## 2019-03-25 DIAGNOSIS — I69328 Other speech and language deficits following cerebral infarction: Secondary | ICD-10-CM | POA: Diagnosis not present

## 2019-03-25 DIAGNOSIS — R1312 Dysphagia, oropharyngeal phase: Secondary | ICD-10-CM | POA: Diagnosis not present

## 2019-03-25 DIAGNOSIS — I480 Paroxysmal atrial fibrillation: Secondary | ICD-10-CM | POA: Diagnosis not present

## 2019-03-25 NOTE — Telephone Encounter (Signed)
Do you mind faxing this for me

## 2019-03-25 NOTE — Telephone Encounter (Signed)
Fax sent.

## 2019-03-26 DIAGNOSIS — R1312 Dysphagia, oropharyngeal phase: Secondary | ICD-10-CM | POA: Diagnosis not present

## 2019-03-26 DIAGNOSIS — I69391 Dysphagia following cerebral infarction: Secondary | ICD-10-CM | POA: Diagnosis not present

## 2019-03-26 DIAGNOSIS — I480 Paroxysmal atrial fibrillation: Secondary | ICD-10-CM | POA: Diagnosis not present

## 2019-03-26 DIAGNOSIS — I69398 Other sequelae of cerebral infarction: Secondary | ICD-10-CM | POA: Diagnosis not present

## 2019-03-26 DIAGNOSIS — R208 Other disturbances of skin sensation: Secondary | ICD-10-CM | POA: Diagnosis not present

## 2019-03-26 DIAGNOSIS — I69328 Other speech and language deficits following cerebral infarction: Secondary | ICD-10-CM | POA: Diagnosis not present

## 2019-03-26 NOTE — Progress Notes (Signed)
ERROR No Show  

## 2019-03-27 ENCOUNTER — Encounter: Payer: Medicare Other | Admitting: Adult Health

## 2019-03-27 ENCOUNTER — Other Ambulatory Visit: Payer: Self-pay

## 2019-03-27 DIAGNOSIS — I69391 Dysphagia following cerebral infarction: Secondary | ICD-10-CM | POA: Diagnosis not present

## 2019-03-27 DIAGNOSIS — I69328 Other speech and language deficits following cerebral infarction: Secondary | ICD-10-CM | POA: Diagnosis not present

## 2019-03-27 DIAGNOSIS — R1312 Dysphagia, oropharyngeal phase: Secondary | ICD-10-CM | POA: Diagnosis not present

## 2019-03-27 DIAGNOSIS — R208 Other disturbances of skin sensation: Secondary | ICD-10-CM | POA: Diagnosis not present

## 2019-03-27 DIAGNOSIS — I480 Paroxysmal atrial fibrillation: Secondary | ICD-10-CM | POA: Diagnosis not present

## 2019-03-27 DIAGNOSIS — I69398 Other sequelae of cerebral infarction: Secondary | ICD-10-CM | POA: Diagnosis not present

## 2019-03-27 NOTE — Patient Outreach (Signed)
[place] [facility]) Care Management  03/27/2019  [name] 11-07-1940 [id number]  EMMI: stroke red  alert Referral date: 03/24/19 Referral reason: questions/ problems with medications Insurance:  Medicare   Telephone call to patient's son, [name]. HIPAA verified by son for patient.  RNCM explained reason for call. Son states patient is not having any problems with her medications.  He states patient has seen her primary care provider since discharge from the hospital. He states patient has transportation to her appointments. Patient states patient had a issue with nose bleeding since she has been home.  Son states this has resolved. Patient denies any further needs or concerns for patient.    ASSESSMENT: Son guarded at phone call with [facility].   PLAN: RNCM will close case due to patient being assessed and having no further needs.   Quinn Plowman RN,BSN,CCM [facility] Telephonic  [phone]

## 2019-03-28 ENCOUNTER — Other Ambulatory Visit: Payer: Self-pay | Admitting: *Deleted

## 2019-03-28 ENCOUNTER — Telehealth (HOSPITAL_COMMUNITY): Payer: Self-pay | Admitting: Adult Health

## 2019-03-28 ENCOUNTER — Other Ambulatory Visit (HOSPITAL_COMMUNITY): Payer: Self-pay | Admitting: Specialist

## 2019-03-28 DIAGNOSIS — R1312 Dysphagia, oropharyngeal phase: Secondary | ICD-10-CM | POA: Diagnosis not present

## 2019-03-28 DIAGNOSIS — I69391 Dysphagia following cerebral infarction: Secondary | ICD-10-CM | POA: Diagnosis not present

## 2019-03-28 DIAGNOSIS — I69398 Other sequelae of cerebral infarction: Secondary | ICD-10-CM | POA: Diagnosis not present

## 2019-03-28 DIAGNOSIS — I69328 Other speech and language deficits following cerebral infarction: Secondary | ICD-10-CM | POA: Diagnosis not present

## 2019-03-28 DIAGNOSIS — R208 Other disturbances of skin sensation: Secondary | ICD-10-CM | POA: Diagnosis not present

## 2019-03-28 DIAGNOSIS — I480 Paroxysmal atrial fibrillation: Secondary | ICD-10-CM | POA: Diagnosis not present

## 2019-03-28 DIAGNOSIS — R1310 Dysphagia, unspecified: Secondary | ICD-10-CM

## 2019-03-28 NOTE — Patient Outreach (Signed)
[place] [facility]) Care Management  03/28/2019  [name] May 02, 1941 [id number]   EMMI- stroke - d/c from [place] Day # 13 Date: Thursday 03/27/19 1001 Red Alert Reason: Questions/problems with meds? Yes  Insurance: Next[employer] Medicare  [facility] admissions x 1 ED visits x in the last 6 months  Last admission 03/06/19 to 03/12/19   EMMI pending    Conditions: CVA  atrial fibrillation on Coumadin, CHF, CVA, pulmonary embolism, hypertension, coronary artery disease, aortic stenosis    Outreach attempt # 1 No answer. [facility] RN CM left HIPAA compliant voicemail message along with CM's contact info.   Plan: [facility] RN CM sent an unsuccessful outreach letter and scheduled this patient for another call attempt within 4 business days  [name], RN, BSN, [place] Coordinator Office number [phone] Mobile number [phone]  Main [facility] number [phone] Fax number [phone]

## 2019-03-28 NOTE — Telephone Encounter (Signed)
03/28/19  I called Shirlee Latch to cx because there is no Radiologist available on this date.... will RS to 10/5.  She will call the patient

## 2019-03-31 ENCOUNTER — Other Ambulatory Visit: Payer: Self-pay

## 2019-03-31 ENCOUNTER — Other Ambulatory Visit: Payer: Self-pay | Admitting: *Deleted

## 2019-03-31 ENCOUNTER — Encounter: Payer: Self-pay | Admitting: *Deleted

## 2019-03-31 DIAGNOSIS — I69328 Other speech and language deficits following cerebral infarction: Secondary | ICD-10-CM | POA: Diagnosis not present

## 2019-03-31 DIAGNOSIS — R1312 Dysphagia, oropharyngeal phase: Secondary | ICD-10-CM | POA: Diagnosis not present

## 2019-03-31 DIAGNOSIS — R208 Other disturbances of skin sensation: Secondary | ICD-10-CM | POA: Diagnosis not present

## 2019-03-31 DIAGNOSIS — I69398 Other sequelae of cerebral infarction: Secondary | ICD-10-CM | POA: Diagnosis not present

## 2019-03-31 DIAGNOSIS — I480 Paroxysmal atrial fibrillation: Secondary | ICD-10-CM | POA: Diagnosis not present

## 2019-03-31 DIAGNOSIS — I69391 Dysphagia following cerebral infarction: Secondary | ICD-10-CM | POA: Diagnosis not present

## 2019-03-31 NOTE — Patient Outreach (Signed)
[place] [facility]) Care Management  03/31/2019  [name] [birth year] [id number]   EMMI- stroke - d/c from [place] Day # 13 Date: Thursday 03/27/19 1001 Red Alert Reason: Questions/problems with meds? Yes  Insurance: NextGen Medicare and united healthcare medicare  [facility] admissions x 1 ED visits x in the last 6 months  Last admission 03/06/19 to 03/12/19   EMMI  [name] confirms she has questions and can not afford the ordered Eliquis 5 mg bid    Outreach attempt # 2 successful  Patient is able to verify HIPAA, DOB and Address Reviewed and addressed EMMI red alert call to 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 who is independent/assistance for care needs and transportation via her family Lives in home with one of her sons and her dog She has support of her sons and other family members   Conditions: CVA  atrial fibrillation on Coumadin, CHF, CVA, pulmonary embolism, hypertension, coronary artery disease, aortic stenosis    DME: none  Medications  She denies concerns with taking medications as prescribed, and side effects of medications Miranda Papania confirms she has questions and can not afford the ordered Eliquis 5 mg bid  She states she was given a bottle before leaving the hospital  She is on a fixed income and was informed it would cost her $400+  Last seen at primary care MD on 03/18/19 after hospital d/c    Appointments: 04/07/19 1130 [facility] outpatient rehab 1022/20 1315 [place] NP   Advance Directives: Denies need for assist with advance directives   Consent: [facility] RN CM reviewed [facility] services with patient. Patient gave verbal consent for [facility] telephonic RN CM services and [facility] pharmacy     Plan: [facility] RN CM will refer Miranda Martzall  to [facility] for medication assistance and questions about Eliquis  Pt encouraged to return a call to [place] CM prn  [facility] RN CM discussed the outreach letter already sent on with [facility] brochure enclosed for review  [facility] RN CM will follow up with Miranda Addicks within 7 business days for follow up  Routed note to MD   [place]. Lavina Hamman, RN, BSN, [place] Coordinator Office number [phone] Mobile number [phone]  Main [facility] number [phone] Fax number [phone]

## 2019-04-01 DIAGNOSIS — R1312 Dysphagia, oropharyngeal phase: Secondary | ICD-10-CM | POA: Diagnosis not present

## 2019-04-01 DIAGNOSIS — R208 Other disturbances of skin sensation: Secondary | ICD-10-CM | POA: Diagnosis not present

## 2019-04-01 DIAGNOSIS — I69328 Other speech and language deficits following cerebral infarction: Secondary | ICD-10-CM | POA: Diagnosis not present

## 2019-04-01 DIAGNOSIS — I69391 Dysphagia following cerebral infarction: Secondary | ICD-10-CM | POA: Diagnosis not present

## 2019-04-01 DIAGNOSIS — I480 Paroxysmal atrial fibrillation: Secondary | ICD-10-CM | POA: Diagnosis not present

## 2019-04-01 DIAGNOSIS — I69398 Other sequelae of cerebral infarction: Secondary | ICD-10-CM | POA: Diagnosis not present

## 2019-04-02 ENCOUNTER — Other Ambulatory Visit: Payer: Self-pay | Admitting: Pharmacist

## 2019-04-02 ENCOUNTER — Ambulatory Visit (HOSPITAL_COMMUNITY): Payer: Medicare Other | Admitting: Speech Pathology

## 2019-04-02 ENCOUNTER — Encounter (HOSPITAL_COMMUNITY): Payer: Self-pay

## 2019-04-02 ENCOUNTER — Telehealth (HOSPITAL_COMMUNITY): Payer: Self-pay | Admitting: Adult Health

## 2019-04-02 NOTE — Telephone Encounter (Signed)
04/02/19  I left Shirlee Latch a message to confirm the date for the rescheduled MBSS was ok and just wanted to firm everything up.

## 2019-04-02 NOTE — Patient Outreach (Addendum)
[facility]) Care Management  [place]   04/02/2019  [name] 12-25-1940 [id number]  Reason for referral: Medication Assistance with Eliquis  Referral source: [facility] RN / EMMI red (recent stroke) Current insurance: [facility]  PMHx includes but not limited to:  Hx recurrent PE, remote CVA, atrial fibrillation on chronic anticoagulation (Coumadin), HTN / HLD, CHF, CAD, aortic stenosis, hx esophageal cancer, GERD, recent hospitalization for ischemic stroke 9/3-9/9, transitioned from Coumadin to Eliquis, continued on aspirin per neurology.   Outreach:  Successful telephone call with patient.  HIPAA identifiers verified.   Subjective:  Patient reports she is unable to afford Eliquis as the price is $400 at her pharmacy.  She denies wanting to review other medications.  Unable to contact son, [name], today, but left message requesting him to return call when able.    Objective: The ASCVD Risk score Mikey Bussing DC Jr., et al., 2013) failed to calculate for the following reasons:   The patient has a prior MI or stroke diagnosis  Lab Results  Component Value Date   CREATININE 1.14 03/18/2019   CREATININE 0.94 03/15/2019   CREATININE 0.81 03/12/2019    Lab Results  Component Value Date   HGBA1C 6.0 (H) 03/08/2019    Lipid Panel     Component Value Date/Time   CHOL 152 03/08/2019 0334   TRIG 70 03/08/2019 0334   HDL 45 03/08/2019 0334   CHOLHDL 3.4 03/08/2019 0334   VLDL 14 03/08/2019 0334   LDLCALC 93 03/08/2019 0334    BP Readings from Last 3 Encounters:  03/18/19 130/90  03/15/19 (!) 151/65  03/12/19 132/60    Allergies  Allergen Reactions  . Iodinated Diagnostic Agents Anaphylaxis and Other (See Comments)    IPD dye Info given by patient  . Ioxaglate Anaphylaxis and Other (See Comments)    Info given by patient  . Red Dye Anaphylaxis  . Lactose Intolerance (Gi)   . Whey Other (See Comments)    Lactose intolerance  . Darvon  [Propoxyphene] Rash  . Hydralazine Anxiety and Other (See Comments)    Facial flushing, pt prefers not to use it.   . Milk-Related Compounds Other (See Comments)    Lactose intolerance Can tolerate milk if its cooked into the recipe, just can't drink milk     Medications Reviewed Today    Reviewed by Barbaraann Faster, RN (Registered Nurse) on 03/31/19 at 1341  Med List Status: <None>  Medication Order Taking? Sig Documenting Provider Last Dose Status Informant  acetaminophen (TYLENOL) 500 MG tablet [id number]  Take 500-1,000 mg by mouth daily as needed for mild pain or moderate pain.  [provider]  Active Pharmacy Records  amLODipine (NORVASC) 5 MG tablet [id number]  Take 1 tablet (5 mg total) by mouth daily. Pixie Casino, MD  Active Pharmacy Records           Med Note Gentry Roch   Fri Mar 07, 2019 10:19 AM) Lupita Shutter 12/25/18 for 90 days  apixaban (ELIQUIS) 5 MG TABS tablet [id number]  Take 1 tablet (5 mg total) by mouth 2 (two) times daily. Hosie Poisson, MD  Active   aspirin EC 81 MG tablet [id number]  Take 1 tablet (81 mg total) by mouth daily. Pixie Casino, MD  Active Pharmacy Records           Med Note Gentry Roch   Fri Mar 07, 2019 10:18 AM)    atorvastatin (LIPITOR) 40 MG tablet [id number]  Take 1 tablet (40 mg total) by mouth daily at 6 PM. Hosie Poisson, MD  Active   carvedilol (COREG) 25 MG tablet [id number]  TAKE (1) TABLET BY MOUTH TWICE DAILY WITH A MEAL.  Patient taking differently: Take 25 mg by mouth 2 (two) times daily with a meal.    Dorothyann Peng, NP  Active Pharmacy Records           Med Note Gentry Roch   Fri Mar 07, 2019 10:16 AM) Last filled 02/15/19 for 30 days  cyanocobalamin ((VITAMIN B-12)) injection 1,000 mcg [id number]   Eulas Post, MD  Active   furosemide (LASIX) 40 MG tablet [id number]  TAKE 1 TABLET BY MOUTH ONCE DAILY.  Patient taking differently: Take 40 mg by mouth daily.    Dorothyann Peng, NP  Active Pharmacy  Records  meclizine (ANTIVERT) 25 MG tablet [id number]  Take 1 tablet (25 mg total) by mouth as needed for dizziness or nausea. Dorothyann Peng, NP  Active Pharmacy Records  ondansetron [facility]) 4 MG tablet [id number]  Take 1 tablet (4 mg total) by mouth every 8 (eight) hours as needed for nausea or vomiting. Dorothyann Peng, NP  Active Pharmacy Records  pantoprazole (PROTONIX) 40 MG tablet [id number]  TAKE 1 TABLET BY MOUTH TWICE DAILY.  Patient taking differently: Take 40 mg by mouth 2 (two) times daily.    Dorothyann Peng, NP  Active Pharmacy Records           Med Note Gentry Roch   Fri Mar 07, 2019 10:20 AM) Last filled 03/04/19 for 90 days  Pediatric Multivitamins-Iron (FLINTSTONES PLUS IRON) chewable tablet [id number]  Chew 2 tablets by mouth daily. Samuella Cota, MD  Active Pharmacy Records  temazepam (RESTORIL) 15 MG capsule [id number]  TAKE (1) CAPSULE BY MOUTH AT BEDTIME. Nafziger, Tommi Rumps, NP  Active   traMADol (ULTRAM) 50 MG tablet [id number]  TAKE (1) TABLET BY MOUTH EVERY EIGHT HOURS.  Patient taking differently: Take 50 mg by mouth every 8 (eight) hours.    Dorothyann Peng, NP  Active Pharmacy Records           Med Note Gentry Roch   Fri Mar 07, 2019 10:26 AM) Last filled 02/19/19 for 90 days  triamcinolone cream (KENALOG) 0.1 % [id number]  Apply 1 application topically 2 (two) times daily. Eulas Post, MD  Active Pharmacy Records  Med List Note Modena Morrow 02/15/18 1751): Clonidine 0.1 qk  Medications have been confirmed via pharmacy records          Assessment:  Medication Assistance Findings:  Medication assistance needs identified: Eliquis  Extra Help:  Not eligible for Extra Help Low Income Subsidy based on reported income and assets  Patient Assistance Programs: Eliquis made by BMS o Income requirement met: Unknown o Out-of-pocket prescription expenditure met:   Unknown (unlikely as all other medications are T1 and very low co-pays) - Reviewed  program requirements with patient.    Call placed to [facility]  Spoke with pharmacist, Tripp, who confirmed Eliquis 30DS is $138.46.  Patient likely has deductible to meet which is why co-pay is high.  Patient receives compliance packs from [facility] and these were delivered to patient last week.  Eliquis was not included as patient had supply for hospital on hand still and did not want to pay co-pay yet.   Unsuccessful return call to patient to provide update on Eliquis co-pay.  Left HIPAA complaint voicemail.   Plan: . Will try to reach  patient again tomorrow.  Will await call back also from son to provide update.   Ralene Bathe, PharmD, [place] [phone]   Addendum: Incoming call from patient.  Reviewed pharmacy co-pay information with patient and possible deductible she has to meet for Tier 3 medications.  Patient inquired about [employer] but unfortunately, this coupon price is $472 at the lowest.  Patient voiced appreciation.  No further questions at this time.  Encouraged patient to call pharmacy to request refill a few days before she runs out of medication.    Plan: Will close [facility] pharmacy case as no further medication questions at this time.    Ralene Bathe, PharmD, [place] [phone]

## 2019-04-03 ENCOUNTER — Ambulatory Visit: Payer: Medicare Other | Admitting: Pharmacist

## 2019-04-03 DIAGNOSIS — I69328 Other speech and language deficits following cerebral infarction: Secondary | ICD-10-CM | POA: Diagnosis not present

## 2019-04-03 DIAGNOSIS — I69398 Other sequelae of cerebral infarction: Secondary | ICD-10-CM | POA: Diagnosis not present

## 2019-04-03 DIAGNOSIS — R208 Other disturbances of skin sensation: Secondary | ICD-10-CM | POA: Diagnosis not present

## 2019-04-03 DIAGNOSIS — I480 Paroxysmal atrial fibrillation: Secondary | ICD-10-CM | POA: Diagnosis not present

## 2019-04-03 DIAGNOSIS — I69391 Dysphagia following cerebral infarction: Secondary | ICD-10-CM | POA: Diagnosis not present

## 2019-04-03 DIAGNOSIS — R1312 Dysphagia, oropharyngeal phase: Secondary | ICD-10-CM | POA: Diagnosis not present

## 2019-04-04 ENCOUNTER — Telehealth: Payer: Self-pay | Admitting: Adult Health

## 2019-04-04 ENCOUNTER — Ambulatory Visit (HOSPITAL_COMMUNITY): Payer: Medicare Other | Admitting: Speech Pathology

## 2019-04-04 DIAGNOSIS — I480 Paroxysmal atrial fibrillation: Secondary | ICD-10-CM | POA: Diagnosis not present

## 2019-04-04 DIAGNOSIS — R208 Other disturbances of skin sensation: Secondary | ICD-10-CM | POA: Diagnosis not present

## 2019-04-04 DIAGNOSIS — I69398 Other sequelae of cerebral infarction: Secondary | ICD-10-CM | POA: Diagnosis not present

## 2019-04-04 DIAGNOSIS — I69328 Other speech and language deficits following cerebral infarction: Secondary | ICD-10-CM | POA: Diagnosis not present

## 2019-04-04 DIAGNOSIS — R1312 Dysphagia, oropharyngeal phase: Secondary | ICD-10-CM | POA: Diagnosis not present

## 2019-04-04 DIAGNOSIS — I69391 Dysphagia following cerebral infarction: Secondary | ICD-10-CM | POA: Diagnosis not present

## 2019-04-04 NOTE — Telephone Encounter (Signed)
Anne Ng, from [facility],called stating the pt refused her PT visit today due to too many visits. Please advise.    [phone]

## 2019-04-07 ENCOUNTER — Ambulatory Visit (HOSPITAL_COMMUNITY)
Admission: RE | Admit: 2019-04-07 | Discharge: 2019-04-07 | Disposition: A | Discharge status: Home / Self Care | Payer: Medicare Other | Source: Ambulatory Visit | Attending: Adult Health | Admitting: Adult Health

## 2019-04-07 ENCOUNTER — Other Ambulatory Visit: Payer: Self-pay

## 2019-04-07 ENCOUNTER — Ambulatory Visit (HOSPITAL_COMMUNITY): Payer: No Typology Code available for payment source | Attending: Adult Health | Admitting: Speech Pathology

## 2019-04-07 ENCOUNTER — Encounter (HOSPITAL_COMMUNITY): Payer: Self-pay | Admitting: Speech Pathology

## 2019-04-07 DIAGNOSIS — I69391 Dysphagia following cerebral infarction: Secondary | ICD-10-CM | POA: Diagnosis not present

## 2019-04-07 DIAGNOSIS — R1312 Dysphagia, oropharyngeal phase: Secondary | ICD-10-CM | POA: Diagnosis not present

## 2019-04-07 DIAGNOSIS — I480 Paroxysmal atrial fibrillation: Secondary | ICD-10-CM | POA: Diagnosis not present

## 2019-04-07 DIAGNOSIS — R1310 Dysphagia, unspecified: Secondary | ICD-10-CM

## 2019-04-07 DIAGNOSIS — I639 Cerebral infarction, unspecified: Secondary | ICD-10-CM | POA: Diagnosis not present

## 2019-04-07 DIAGNOSIS — I69398 Other sequelae of cerebral infarction: Secondary | ICD-10-CM | POA: Diagnosis not present

## 2019-04-07 DIAGNOSIS — R208 Other disturbances of skin sensation: Secondary | ICD-10-CM | POA: Diagnosis not present

## 2019-04-07 DIAGNOSIS — I69328 Other speech and language deficits following cerebral infarction: Secondary | ICD-10-CM | POA: Diagnosis not present

## 2019-04-07 NOTE — Therapy (Signed)
[place], [place], [address] Phone: [phone]   Fax:  [phone]  Modified Barium Swallow  Patient Details  Name: [name] MRN: [id number] Date of Birth: 1940/10/16 No data recorded  Encounter Date: 04/07/2019  End of Session - 04/07/19 1347    Visit Number  1    Number of Visits  1    Authorization Type  Medicare    SLP Start Time  1140    SLP Stop Time   1210    SLP Time Calculation (min)  30 min    Activity Tolerance  Patient tolerated treatment well       Past Medical History:  Diagnosis Date  . Anemia 2005   Generally microcytic, transfusions in [address], 2012, 02/2015, 05/2015  . Aortic stenosis    Moderate November 2017  . Arthritis   . Broken back 2013   Chronic back pain.   [name] CAD (coronary artery disease)    Cardiac catheterization 2014 - 80% mid RCA and 70% OM managed medically  . CHF (congestive heart failure) ([place])   . Chronic bilateral pleural effusions   . Chronic headache   . Contrast media allergy   . Diastolic heart failure ([place])   . Esophageal cancer ([place]) 05/06/2015   Adenocarcinoma GE junction  . Facial paresthesia   . GERD (gastroesophageal reflux disease)   . H. pylori infection   . H/O iron deficiency    05-06-15 iron infusion ([facility])  . HH (hiatus hernia) 2008   Large with associated erosions  . Hypertension   . Paroxysmal atrial fibrillation ([name])   . PONV (postoperative nausea and vomiting)   . Pulmonary emboli ([place]) 2008, 2012  . Stroke [facility]) 1982    Past Surgical History:  Procedure Laterality Date  . APPENDECTOMY  1950s  . BACK SURGERY    . CARPAL TUNNEL RELEASE Bilateral 1990s  . [place] SURGERY  2014  . CHOLECYSTECTOMY  1980s   open  . COLONOSCOPY N/A 02/17/2015   Procedure: COLONOSCOPY;  Surgeon: [name], MD;  Location: [facility] ENDOSCOPY;  Service: Endoscopy;  Laterality: N/A;  . ESOPHAGOGASTRODUODENOSCOPY N/A 02/16/2015   Procedure:  ESOPHAGOGASTRODUODENOSCOPY (EGD);  Surgeon: [name], MD;  Location: [facility];  Service: Endoscopy;  Laterality: N/A;  . ESOPHAGOGASTRODUODENOSCOPY N/A 05/06/2015   Procedure: ESOPHAGOGASTRODUODENOSCOPY (EGD);  Surgeon: [name], MD;  Location: [facility] ENDOSCOPY;  Service: Endoscopy;  Laterality: N/A;  . EUS N/A 05/20/2015   Procedure: UPPER ENDOSCOPIC ULTRASOUND (EUS) LINEAR;  Surgeon: [name], MD;  Location: [facility] ENDOSCOPY;  Service: Endoscopy;  Laterality: N/A;  . exploratory lab  1950s or 1960s  . GIVENS CAPSULE STUDY N/A 05/06/2015   Procedure: GIVENS CAPSULE STUDY;  Surgeon: [name], MD;  Location[facility] ENDOSCOPY;  Service: Endoscopy;  Laterality: N/A;  . KNEE ARTHROSCOPY WITH MEDIAL MENISECTOMY Left 04/18/2018   Procedure: LEFT KNEE ARTHROSCOPY WITH PARTIAL MEDIAL MENISECTOMY AND ANTERIOR CRUCIATE LIGAMENT DEBRIDEMENT;  Surgeon: [name], MD;  Location: [facility] ORS;  Service: Orthopedics;  Laterality: Left;  . LEFT HEART CATH AND CORONARY ANGIOGRAPHY N/A 11/08/2016   Procedure: Left Heart Cath and Coronary Angiography;  Surgeon: [name], MD;  Location: [place] CV LAB;  Service: Cardiovascular;  Laterality: N/A;  . lumbar back surgery  2012  . [place] SURGERY  1991  . TONSILLECTOMY    . TONSILLECTOMY AND ADENOIDECTOMY  1960s    There were no vitals filed for  this visit.  Subjective Assessment - 04/07/19 1332    Subjective  "I can tell that I am doing better."    Special Tests  MBSS    Currently in Pain?  No/denies          General - 04/07/19 1333      General Information   Date of Onset  03/07/19    HPI  Pt is a 78 yo female who was referred for MBSS by Dorothyann Peng, NP due to dysphagia (acute on chronic). Pt with past medical history significant for chronic congestive heart failure, coronary artery disease, paroxysmal atrial fibrillation on Coumadin, PE, prior stroke, moderate aortic stenosis, hypertension presented to [facility] [place] emergency department for sudden onset slurred speech as well as left facial droop. MRI revealed acute right M2 occlusion, consistent with the right MCA territory infarct and moderate atherosclerotic change elsewhere throughout the intracranial circulation.   Type of Study  MBS-Modified Barium Swallow Study    Previous Swallow Assessment  MBSS 03/10/19    Diet Prior to this Study  Dysphagia 1 (puree);Nectar-thick liquids    Temperature Spikes Noted  No    Respiratory Status  Room air    History of Recent Intubation  No    Behavior/Cognition  Alert;Cooperative;Pleasant mood    Oral Cavity Assessment  Within Functional Limits    Oral Care Completed by SLP  No    Oral Cavity - Dentition  Adequate natural dentition;Missing dentition    Vision  Functional for self feeding    Self-Feeding Abilities  Able to feed self    Patient Positioning  Upright in chair    Baseline Vocal Quality  Normal    Volitional Cough  Strong    Volitional Swallow  Able to elicit    Anatomy  Within functional limits   evidence of c-spine hardware   Pharyngeal Secretions  Not observed secondary MBS         Oral Preparation/Oral Phase - 04/07/19 1336      Oral Preparation/Oral Phase   Oral Phase  Impaired      Oral - Solids   Oral - Regular  Piecemeal swallowing    Oral - Pill  Absent A-P transit   unable to swallow pill with thin or puree     Electrical stimulation - Oral Phase   Was Electrical Stimulation Used  No       Pharyngeal Phase - 04/07/19 1337      Pharyngeal Phase   Pharyngeal Phase  Impaired      Pharyngeal - Nectar   Pharyngeal- Nectar Cup  Swallow initiation at vallecula;Reduced epiglottic inversion;Pharyngeal residue - valleculae;Penetration/Aspiration during swallow    Pharyngeal  Material does not enter airway;Material enters airway, remains ABOVE vocal cords then ejected out      Pharyngeal - Thin   Pharyngeal- Thin Teaspoon  Swallow initiation at vallecula;Reduced  epiglottic inversion;Pharyngeal residue - valleculae    Pharyngeal- Thin Cup  Swallow initiation at vallecula;Reduced epiglottic inversion;Pharyngeal residue - valleculae;Nasopharyngeal reflux   reduced velopharyngeal closure   Pharyngeal- Thin Straw  Penetration/Aspiration during swallow    Pharyngeal  Material does not enter airway;Material enters airway, remains ABOVE vocal cords then ejected out      Pharyngeal - Solids   Pharyngeal- Puree  Swallow initiation at vallecula;Reduced epiglottic inversion;Reduced tongue base retraction;Pharyngeal residue - valleculae    Pharyngeal- Regular  Reduced tongue base retraction;Pharyngeal residue - valleculae      Pharyngeal Phase - Comment   Pharyngeal  Comment  Pt with reduced epiglottic deflection with liquids resulting in vallecular pooling and retrograde movement into the nasopharynx      Electrical Stimulation - Pharyngeal Phase   Was Electrical Stimulation Used  No       Cricopharyngeal Phase - 04/07/19 1345      Cervical Esophageal Phase   Cervical Esophageal Phase  Within functional limits   Radiologist confirmed a hiatal hernia       Plan - 04/07/19 1347    Clinical Impression Statement  Pt presents with mild/mod oropharyngeal dysphagia characterized by mild lingual weakness with reduced lingual manipulation of bolus and premature spillage into pharynx, incomplete epiglottic deflection with liquids resulting in retrograde movement of bolus in valleculae into nasopharynx before pooling in valleculae post swallow. Pt with improved epiglottic deflection with heavier bolus (puree and regular textures) and adequate pharyngeal clearance/strength. Reduced epiglottic deflection possibly attributed at least in part to previous c-spine fusions and hardware. Pt acknowledges previous dysphagia prior to recent stroke. Pt with one episode of gross aspiration of thins before the swallow down the anterior tracheal wall when attempting to swallow barium  tablet with cup sips thin due to premature spillage directly into laryngeal vestibule and reduced vocal fold closure. This resulted in a delayed, ineffective, weak cough. Pt was unable to swallow the barium tablet with thins or with puree and it was expectorated. Recommend D3/mech soft and thin liquids via small, cups sips with repeat/dry swallows throughout. Pt was assessed both with an without chin tuck due to Pt already implementing- Pt can continue with head neutral to chin slightly down.    Treatment/Interventions  Aspiration precaution training;Patient/family education    Potential to Achieve Goals  Good    Consulted and Agree with Plan of Care  Patient       Patient will benefit from skilled therapeutic intervention in order to improve the following deficits and impairments:   Dysphagia, oropharyngeal phase    Recommendations/Treatment - 04/07/19 1346      Swallow Evaluation Recommendations   SLP Diet Recommendations  Thin;Dysphagia 3 (mechanical soft)    Liquid Administration via  Cup    Medication Administration  Whole meds with puree    Supervision  Patient able to self feed    Compensations  Small sips/bites;Multiple dry swallows after each bite/sip    Postural Changes  Seated upright at 90 degrees;Remain upright for at least 30 minutes after feeds/meals       Prognosis - 04/07/19 1346      Prognosis   Prognosis for Safe Diet Advancement  Good    Barriers to Reach Goals  Severity of deficits   premorbid dysphagia     Individuals Consulted   Consulted and Agree with Results and Recommendations  Patient    Report Sent to   Referring physician;Primary SLP       Problem List Patient Active Problem List   Diagnosis Date Noted  . Stroke ([place]) 03/07/2019  . S/P left knee arthroscopy 04/18/18 06/06/2018  . Acute medial meniscus tear, left, subsequent encounter 04/18/2018  . Bilateral pleural effusion   . Peripheral neuropathy 12/21/2017  . CAD (coronary artery disease)  12/19/2017  . Long term (current) use of anticoagulants 03/14/2017  . Numbness of face 01/09/2017  . B12 deficiency 10/16/2016  . Hyperglycemia 06/26/2016  . Atrial fibrillation with RVR ([place]) 06/23/2016  . Encounter for medication review and counseling 06/07/2016  . Essential hypertension, benign 04/19/2016  . Aortic valve stenosis 09/28/2015  . GE junction  carcinoma ([place]) 05/14/2015  . [place] lesion, acute   . IDA (iron deficiency anemia)   . Mixed hyperlipidemia 05/05/2015  . GERD (gastroesophageal reflux disease) 05/05/2015  . Absolute anemia   . Osteoarthritis 04/16/2015  . History of pulmonary embolism 03/02/2015  . Benign neoplasm of descending colon 02/17/2015  . Diverticulosis of large intestine without diverticulitis 02/17/2015  . Esophageal stricture 02/16/2015  . Macular degeneration 09/01/2014  . Benign paroxysmal positional vertigo   . Uncontrolled hypertension 08/24/2014  . Sleep disturbance 08/03/2014  . PAF (paroxysmal atrial fibrillation) ([place]) 01/21/2014  . Long term current use of anticoagulant therapy 01/21/2014  . Chronic diastolic congestive heart failure [facility]) 01/21/2014   Thank you,  Genene Churn, [place]  [facility] 04/07/2019, 1:49 PM  [place] [address] [place], [place], [address] Phone: [phone]   Fax:  [phone]  Name: [name] MRN: [id number] Date of Birth: 08/11/1940

## 2019-04-08 ENCOUNTER — Other Ambulatory Visit: Payer: Self-pay | Admitting: *Deleted

## 2019-04-08 DIAGNOSIS — R1312 Dysphagia, oropharyngeal phase: Secondary | ICD-10-CM | POA: Diagnosis not present

## 2019-04-08 DIAGNOSIS — I69328 Other speech and language deficits following cerebral infarction: Secondary | ICD-10-CM | POA: Diagnosis not present

## 2019-04-08 DIAGNOSIS — I480 Paroxysmal atrial fibrillation: Secondary | ICD-10-CM | POA: Diagnosis not present

## 2019-04-08 DIAGNOSIS — R208 Other disturbances of skin sensation: Secondary | ICD-10-CM | POA: Diagnosis not present

## 2019-04-08 DIAGNOSIS — I69391 Dysphagia following cerebral infarction: Secondary | ICD-10-CM | POA: Diagnosis not present

## 2019-04-08 DIAGNOSIS — I69398 Other sequelae of cerebral infarction: Secondary | ICD-10-CM | POA: Diagnosis not present

## 2019-04-08 NOTE — Patient Outreach (Signed)
  [name] Care Management  04/08/2019  [name] [birth year] [id number]   EMMI- stroke- d/c from [place] Day #13 Date:Thursday 03/27/19 1001 Red Alert Reason:Questions/problems with meds?Yes  Insurance:[employer] Medicare and united healthcare medicare  [facility] admissions x1ED visits x in the last 6 months  Last admission 03/06/19 to 03/12/19    Outreach attempt #3 successful  Patient is able to verify HIPAA, DOB and Address Reviewed reason for the follow up call to [facility] patient She also gave permission for [facility] RN CM to speak with [name] her family member and her son [name] [facility] RN CM left the number for [facility] pharmacist for [name] to return a call as noted in pharmacy note  [name] reports she doing well today  She reports being tired after 2 sessions of home therapy. Epic notes indicates she had refused some sessions as she voiced they were too many for her   Social: [name] is a retired pt who is independent/assistance for care needs and transportation via her family Lives in home with one of her sons and her dog She has support of her sons and other family members   Conditions:CVAatrial fibrillation on Coumadin, CHF, CVA, pulmonary embolism, hypertension, coronary artery disease, aortic stenosis   DME: none  Medications  She denies concerns with taking medications as prescribed, and side effects of medications [name] continues to work with [facility] staff as she can not afford the ordered Eliquis 5 mg bid  She states she was given a bottle before leaving the hospital  She is on a fixed income and was informed it would cost her $400+   [name] states she may have to purchase Eliquis at the cost of $138.46 as she is scheduled to go out of town on 04/11/19 and will not be available again until 04/16/19 She does not want to be without her medication CM offered to attempt to connect  her, her son and [place] staff [facility] RN CM sent and e mail to [place] staff, [place]  Appointments: 04/24/19 1315 [place] NP   Advance Directives: Denies need for assist with advance directives   Consent: [facility] RN CM reviewed [facility] services with patient. Patient gave verbal consent for [facility] telephonic RN CM services and [facility] pharmacy     Plan: [facility] RN CM will follow up with [name] in 7-14 business daysfor an update and for EMMI case closure if she is doing well   Pt encouraged to return a call to [facility] RN CM prn  Routed note to MD   [place]. Miranda Hamman, RN, BSN, [place] Coordinator Office number [phone] Mobile number [phone]  Main [facility] number [phone] Fax number [phone]

## 2019-04-09 ENCOUNTER — Telehealth: Payer: Self-pay | Admitting: Adult Health

## 2019-04-09 DIAGNOSIS — I69391 Dysphagia following cerebral infarction: Secondary | ICD-10-CM | POA: Diagnosis not present

## 2019-04-09 DIAGNOSIS — R208 Other disturbances of skin sensation: Secondary | ICD-10-CM | POA: Diagnosis not present

## 2019-04-09 DIAGNOSIS — I480 Paroxysmal atrial fibrillation: Secondary | ICD-10-CM | POA: Diagnosis not present

## 2019-04-09 DIAGNOSIS — R1312 Dysphagia, oropharyngeal phase: Secondary | ICD-10-CM | POA: Diagnosis not present

## 2019-04-09 DIAGNOSIS — I69398 Other sequelae of cerebral infarction: Secondary | ICD-10-CM | POA: Diagnosis not present

## 2019-04-09 DIAGNOSIS — I69328 Other speech and language deficits following cerebral infarction: Secondary | ICD-10-CM | POA: Diagnosis not present

## 2019-04-09 NOTE — Telephone Encounter (Signed)
Home Health Verbal Orders - Caller/Agency: [facility] Number: [phone] (can leave VM with order) Requesting OT/PT/Skilled Nursing/Social Work/Speech Therapy: speech therapy  Frequency: 1x a week for 4 weeks.

## 2019-04-09 NOTE — Telephone Encounter (Signed)
Left a message on identified voicemail informing to proceed with orders. Nothing further needed.

## 2019-04-11 ENCOUNTER — Telehealth: Payer: Self-pay

## 2019-04-11 NOTE — Telephone Encounter (Signed)
Copied from [place] [id number]. Topic: Quick Communication - Home Health Verbal Orders >> Apr 10, 2019  5:30 PM Erick Blinks wrote: "OT" [facility]  Discharged because pt states OT is "Too much" for her  Wendelyn Breslow  [phone] VM okay

## 2019-04-11 NOTE — Telephone Encounter (Signed)
Noted  

## 2019-04-14 DIAGNOSIS — K219 Gastro-esophageal reflux disease without esophagitis: Secondary | ICD-10-CM | POA: Diagnosis not present

## 2019-04-14 DIAGNOSIS — K449 Diaphragmatic hernia without obstruction or gangrene: Secondary | ICD-10-CM | POA: Diagnosis not present

## 2019-04-14 DIAGNOSIS — M549 Dorsalgia, unspecified: Secondary | ICD-10-CM | POA: Diagnosis not present

## 2019-04-14 DIAGNOSIS — E782 Mixed hyperlipidemia: Secondary | ICD-10-CM | POA: Diagnosis not present

## 2019-04-14 DIAGNOSIS — G8929 Other chronic pain: Secondary | ICD-10-CM | POA: Diagnosis not present

## 2019-04-14 DIAGNOSIS — Z86711 Personal history of pulmonary embolism: Secondary | ICD-10-CM | POA: Diagnosis not present

## 2019-04-14 DIAGNOSIS — I69398 Other sequelae of cerebral infarction: Secondary | ICD-10-CM | POA: Diagnosis not present

## 2019-04-14 DIAGNOSIS — R1312 Dysphagia, oropharyngeal phase: Secondary | ICD-10-CM | POA: Diagnosis not present

## 2019-04-14 DIAGNOSIS — Z79891 Long term (current) use of opiate analgesic: Secondary | ICD-10-CM | POA: Diagnosis not present

## 2019-04-14 DIAGNOSIS — R791 Abnormal coagulation profile: Secondary | ICD-10-CM | POA: Diagnosis not present

## 2019-04-14 DIAGNOSIS — M1990 Unspecified osteoarthritis, unspecified site: Secondary | ICD-10-CM | POA: Diagnosis not present

## 2019-04-14 DIAGNOSIS — I110 Hypertensive heart disease with heart failure: Secondary | ICD-10-CM | POA: Diagnosis not present

## 2019-04-14 DIAGNOSIS — J90 Pleural effusion, not elsewhere classified: Secondary | ICD-10-CM | POA: Diagnosis not present

## 2019-04-14 DIAGNOSIS — R208 Other disturbances of skin sensation: Secondary | ICD-10-CM | POA: Diagnosis not present

## 2019-04-14 DIAGNOSIS — I2510 Atherosclerotic heart disease of native coronary artery without angina pectoris: Secondary | ICD-10-CM | POA: Diagnosis not present

## 2019-04-14 DIAGNOSIS — Z7901 Long term (current) use of anticoagulants: Secondary | ICD-10-CM | POA: Diagnosis not present

## 2019-04-14 DIAGNOSIS — D509 Iron deficiency anemia, unspecified: Secondary | ICD-10-CM | POA: Diagnosis not present

## 2019-04-14 DIAGNOSIS — I69328 Other speech and language deficits following cerebral infarction: Secondary | ICD-10-CM | POA: Diagnosis not present

## 2019-04-14 DIAGNOSIS — I5032 Chronic diastolic (congestive) heart failure: Secondary | ICD-10-CM | POA: Diagnosis not present

## 2019-04-14 DIAGNOSIS — I350 Nonrheumatic aortic (valve) stenosis: Secondary | ICD-10-CM | POA: Diagnosis not present

## 2019-04-14 DIAGNOSIS — C160 Malignant neoplasm of cardia: Secondary | ICD-10-CM | POA: Diagnosis not present

## 2019-04-14 DIAGNOSIS — I69391 Dysphagia following cerebral infarction: Secondary | ICD-10-CM | POA: Diagnosis not present

## 2019-04-14 DIAGNOSIS — R519 Headache, unspecified: Secondary | ICD-10-CM | POA: Diagnosis not present

## 2019-04-14 DIAGNOSIS — I480 Paroxysmal atrial fibrillation: Secondary | ICD-10-CM | POA: Diagnosis not present

## 2019-04-15 DIAGNOSIS — I69328 Other speech and language deficits following cerebral infarction: Secondary | ICD-10-CM | POA: Diagnosis not present

## 2019-04-15 DIAGNOSIS — I69391 Dysphagia following cerebral infarction: Secondary | ICD-10-CM | POA: Diagnosis not present

## 2019-04-15 DIAGNOSIS — R1312 Dysphagia, oropharyngeal phase: Secondary | ICD-10-CM | POA: Diagnosis not present

## 2019-04-15 DIAGNOSIS — I69398 Other sequelae of cerebral infarction: Secondary | ICD-10-CM | POA: Diagnosis not present

## 2019-04-15 DIAGNOSIS — I480 Paroxysmal atrial fibrillation: Secondary | ICD-10-CM | POA: Diagnosis not present

## 2019-04-15 DIAGNOSIS — R208 Other disturbances of skin sensation: Secondary | ICD-10-CM | POA: Diagnosis not present

## 2019-04-16 ENCOUNTER — Inpatient Hospital Stay: Payer: 59 | Admitting: Adult Health

## 2019-04-16 ENCOUNTER — Ambulatory Visit: Payer: Self-pay | Admitting: *Deleted

## 2019-04-17 ENCOUNTER — Other Ambulatory Visit: Payer: Self-pay | Admitting: *Deleted

## 2019-04-17 NOTE — Patient Outreach (Signed)
[facility]) Care Management  04/17/2019  NIVEDHA DAROCHA [birth year] [id number]   EMMI- stroke- d/c from [place] Day #13 Date:Thursday 03/27/19 1001 Red Alert Reason:Questions/problems with meds?Yes  Insurance:NextGen Medicareand united healthcare medicare  [facility] admissions x1ED visits x in the last 6 months  Last admission 03/06/19 to 03/12/19  Patient returned a call to [place] Patient is able to verify HIPAA, DOB and Address  Present Concerns Mrs Hillmann reports concern with going to her neurology office today 10/15/200 for an appointment that is scheduled for April 24 2019  She was informed by a neurology staff member that she missed an appointment with [facility] RN CM and was tearful.  [facility] RN CM clarified with her that she had not missed an appointment with [facility] RN CM and that CM was scheduled to call her.  She voiced appreciation and understanding but states she is having some memory concerns as her niece reminded her that this was the second time she had her to take her to an appointment on the incorrect date  She and [facility] RN CM discussed her reviewing this with the speech therapist and her neurology staff for an evaluation. Today her speech continues to be slow and garbled at times.   She reports she has had her other therapies (OT an PT) discontinued  She continues to be okay completing her ADLs and has her niece, Mearl Latin to take her to medical appointments per Mrs Dury  Her friend,  Hoyle Sauer (correction not [name]) is reported to be delaying visits to pt at this time . Mrs Swerdlow agreed to a follow up call as she reported she had someone at her front door and had to conclude the call    Consent: [facility] RN CM reviewed [facility] services with patient. Patient gave verbal consent for [facility] telephonic RN CM services.  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 Chrissie Noa works.  She has support of her sons, Grandsons and other family members   Conditions:CVAatrial fibrillation on Coumadin, CHF, CVA, pulmonary embolism, hypertension, coronary artery disease, aortic stenosis  Medications: [facility] RN CM and Mrs Mcduffey discussed her deductible and the out of pocket she paid to get her Eliquis. She uses [employer] and has pill packing services so she will not miss her medications.   Plan: [facility] RN CM will follow up with Mrs Piner within 7-14 days to check to see if she is okay with her cost of her medicine or will need another [place] consult  Pt encouraged to return a call to [facility] RN CM prn   Kimberly L. Lavina Hamman, RN, BSN, [place] Coordinator Office number [phone] Mobile number [phone]  Main [facility] number [phone] Fax number [phone]

## 2019-04-17 NOTE — Patient Outreach (Addendum)
  [place] [place] [facility]) Care Management  04/17/2019  [name] April 10, 1941 [id number]   EMMI- stroke- d/c from [place] Day #13 Date:Thursday 03/27/19 1001 Red Alert Reason:Questions/problems with meds?Yes  Insurance:NextGen Medicareand united healthcare medicare  [facility] admissions x1ED visits x in the last 6 months  Last admission 03/06/19 to 03/12/19   [address] [facility] RN CM received a message while out of the office from pti's niece, [name], requesting a call back to pt's mobile number. They went to her neurology office but the appointment is not until 04/24/19 and while there a neurology staff member informed them that the patient had an appointment with [facility] RN CM.   [id number] Outreach attempts unsuccessful  No answer at [phone] and her niece, [name], [phone]. [facility] RN CM left HIPAA compliant voicemail messages along with CM's contact info.   Plan: [facility] RN CM scheduled this engaged patient for another call attempt within 4 -7 business days   Miranda Raya L. Lavina Hamman, RN, BSN, [place] Coordinator Office number [phone] Mobile number [phone]  Main [facility] number [phone] Fax number [phone]

## 2019-04-18 DIAGNOSIS — R208 Other disturbances of skin sensation: Secondary | ICD-10-CM | POA: Diagnosis not present

## 2019-04-18 DIAGNOSIS — I69398 Other sequelae of cerebral infarction: Secondary | ICD-10-CM | POA: Diagnosis not present

## 2019-04-18 DIAGNOSIS — I69391 Dysphagia following cerebral infarction: Secondary | ICD-10-CM | POA: Diagnosis not present

## 2019-04-18 DIAGNOSIS — I480 Paroxysmal atrial fibrillation: Secondary | ICD-10-CM | POA: Diagnosis not present

## 2019-04-18 DIAGNOSIS — R1312 Dysphagia, oropharyngeal phase: Secondary | ICD-10-CM | POA: Diagnosis not present

## 2019-04-18 DIAGNOSIS — I69328 Other speech and language deficits following cerebral infarction: Secondary | ICD-10-CM | POA: Diagnosis not present

## 2019-04-21 ENCOUNTER — Ambulatory Visit: Payer: Self-pay | Admitting: *Deleted

## 2019-04-21 DIAGNOSIS — R208 Other disturbances of skin sensation: Secondary | ICD-10-CM | POA: Diagnosis not present

## 2019-04-21 DIAGNOSIS — I69328 Other speech and language deficits following cerebral infarction: Secondary | ICD-10-CM | POA: Diagnosis not present

## 2019-04-21 DIAGNOSIS — I69398 Other sequelae of cerebral infarction: Secondary | ICD-10-CM | POA: Diagnosis not present

## 2019-04-21 DIAGNOSIS — I480 Paroxysmal atrial fibrillation: Secondary | ICD-10-CM | POA: Diagnosis not present

## 2019-04-21 DIAGNOSIS — R1312 Dysphagia, oropharyngeal phase: Secondary | ICD-10-CM | POA: Diagnosis not present

## 2019-04-21 DIAGNOSIS — I69391 Dysphagia following cerebral infarction: Secondary | ICD-10-CM | POA: Diagnosis not present

## 2019-04-22 DIAGNOSIS — R1312 Dysphagia, oropharyngeal phase: Secondary | ICD-10-CM | POA: Diagnosis not present

## 2019-04-22 DIAGNOSIS — I69398 Other sequelae of cerebral infarction: Secondary | ICD-10-CM | POA: Diagnosis not present

## 2019-04-22 DIAGNOSIS — R208 Other disturbances of skin sensation: Secondary | ICD-10-CM | POA: Diagnosis not present

## 2019-04-22 DIAGNOSIS — I69328 Other speech and language deficits following cerebral infarction: Secondary | ICD-10-CM | POA: Diagnosis not present

## 2019-04-22 DIAGNOSIS — I480 Paroxysmal atrial fibrillation: Secondary | ICD-10-CM | POA: Diagnosis not present

## 2019-04-22 DIAGNOSIS — I69391 Dysphagia following cerebral infarction: Secondary | ICD-10-CM | POA: Diagnosis not present

## 2019-04-24 ENCOUNTER — Other Ambulatory Visit: Payer: Self-pay | Admitting: Adult Health

## 2019-04-24 ENCOUNTER — Other Ambulatory Visit: Payer: Self-pay

## 2019-04-24 ENCOUNTER — Ambulatory Visit (INDEPENDENT_AMBULATORY_CARE_PROVIDER_SITE_OTHER): Payer: Medicare Other | Admitting: Adult Health

## 2019-04-24 ENCOUNTER — Encounter: Payer: Self-pay | Admitting: Adult Health

## 2019-04-24 VITALS — BP 131/65 | HR 72 | Temp 96.9°F | Ht 62.0 in | Wt 105.0 lb

## 2019-04-24 DIAGNOSIS — I480 Paroxysmal atrial fibrillation: Secondary | ICD-10-CM | POA: Diagnosis not present

## 2019-04-24 DIAGNOSIS — E782 Mixed hyperlipidemia: Secondary | ICD-10-CM | POA: Diagnosis not present

## 2019-04-24 DIAGNOSIS — R1312 Dysphagia, oropharyngeal phase: Secondary | ICD-10-CM

## 2019-04-24 DIAGNOSIS — I63411 Cerebral infarction due to embolism of right middle cerebral artery: Secondary | ICD-10-CM

## 2019-04-24 DIAGNOSIS — R471 Dysarthria and anarthria: Secondary | ICD-10-CM | POA: Diagnosis not present

## 2019-04-24 DIAGNOSIS — I10 Essential (primary) hypertension: Secondary | ICD-10-CM

## 2019-04-24 NOTE — Progress Notes (Signed)
I agree with the above plan 

## 2019-04-24 NOTE — Progress Notes (Signed)
[facility] [address] [place] [address] [phone]       HOSPITAL FOLLOW UP NOTE  Ms. [name] Date of Birth:  1941-03-17 Medical Record Number:  [id number]   Reason for Referral:  hospital stroke follow up    CHIEF COMPLAINT:  Chief Complaint  Patient presents with  . Follow-up    Treatment room, with Niece. No concerns. Doing well.    HPI: [name] being seen today for in office hospital follow-up regarding right MCA stroke secondary to A. fib on 03/06/2019.  History obtained from patient, niece and chart review. Reviewed all radiology images and labs personally.  [name] is a 78 y.o. female with history of Afib on home coumadin, CAD, CHF, PE, prior CVA, aortic stenosis, HTN who was transferred from [facility] after presenting with sudden onset slurred speech and left facial droop.  Stroke work-up revealed acute ischemic right MCA territory branch infarct as evidenced on MRI secondary to acute right M2 branch occlusion as evidenced on MRA secondary to atrial fibrillation.  He did not receive TPA due to anticoagulation.  2D echo unremarkable.  LDL 107.  A1c 6.2.  Recommended restarting DOAC once able to swallow medications.  HTN stable.  Resume Vytorin for HLD management.  Other stroke risk factors include advanced age, history of stroke, history of PE CAD and CHF.  Prior to discharge, patient passed swallow evaluation and was started on dysphagia 1 diet.  Eliquis initiated for history of atrial fibrillation and secondary stroke prevention.  She was discharged home in stable condition with recommendation home health therapies.   [name] is being seen today for hospital follow-up accompanied by her niece.  Residual deficits of dyarthria and mild to moderate oropharyngeal dysphagia.  She continues to participate in home health speech therapy and recently underwent barium swallow with recommended dysphagia 3 thin liquid diet. She  has been doing well with diet advancement with only occasional difficulty.  She also endorses mild cognitive impairment such as not being able to do certain activities as well such as cooking and crocheting.  Currently residing with her son.  Remains on Eliquis without recent bleeding or bruising.  Aspirin discontinued due to episode of epistaxis which has since resolved.  She has not had follow-up with cardiology since hospital discharge.  Blood pressure today satisfactory at 131/65.  Denies new or worsening stroke/TIA symptoms.   ROS:   14 system review of systems performed and negative with exception of speech difficulty and swallowing difficulty  PMH:  Past Medical History:  Diagnosis Date  . Anemia 2005   Generally microcytic, transfusions in [address], 2012, 02/2015, 05/2015  . Aortic stenosis    Moderate November 2017  . Arthritis   . Broken back 2013   Chronic back pain.   [name] CAD (coronary artery disease)    Cardiac catheterization 2014 - 80% mid RCA and 70% OM managed medically  . CHF (congestive heart failure) ([place])   . Chronic bilateral pleural effusions   . Chronic headache   . Contrast media allergy   . Diastolic heart failure ([place])   . Esophageal cancer ([place]) 05/06/2015   Adenocarcinoma GE junction  . Facial paresthesia   . GERD (gastroesophageal reflux disease)   . H. pylori infection   . H/O iron deficiency    05-06-15 iron infusion ([facility])  . HH (hiatus hernia) 2008   Large with associated erosions  . Hypertension   . Paroxysmal atrial fibrillation (  [name])   . PONV (postoperative nausea and vomiting)   . Pulmonary emboli ([place]) 2008, 2012  . Stroke [facility]) 1982    PSH:  Past Surgical History:  Procedure Laterality Date  . APPENDECTOMY  1950s  . BACK SURGERY    . CARPAL TUNNEL RELEASE Bilateral 1990s  . [place] SURGERY  2014  . CHOLECYSTECTOMY  1980s   open  . COLONOSCOPY N/A 02/17/2015   Procedure: COLONOSCOPY;  Surgeon: [name], MD;  Location: [facility]  ENDOSCOPY;  Service: Endoscopy;  Laterality: N/A;  . ESOPHAGOGASTRODUODENOSCOPY N/A 02/16/2015   Procedure: ESOPHAGOGASTRODUODENOSCOPY (EGD);  Surgeon: [name], MD;  Location: [facility] ENDOSCOPY;  Service: Endoscopy;  Laterality: N/A;  . ESOPHAGOGASTRODUODENOSCOPY N/A 05/06/2015   Procedure: ESOPHAGOGASTRODUODENOSCOPY (EGD);  Surgeon: [name], MD;  Location: [facility] ENDOSCOPY;  Service: Endoscopy;  Laterality: N/A;  . EUS N/A 05/20/2015   Procedure: UPPER ENDOSCOPIC ULTRASOUND (EUS) LINEAR;  Surgeon: [name], MD;  Location: [facility] ENDOSCOPY;  Service: Endoscopy;  Laterality: N/A;  . exploratory lab  1950s or 1960s  . GIVENS CAPSULE STUDY N/A 05/06/2015   Procedure: GIVENS CAPSULE STUDY;  Surgeon: [name], MD;  Location[facility] ENDOSCOPY;  Service: Endoscopy;  Laterality: N/A;  . KNEE ARTHROSCOPY WITH MEDIAL MENISECTOMY Left 04/18/2018   Procedure: LEFT KNEE ARTHROSCOPY WITH PARTIAL MEDIAL MENISECTOMY AND ANTERIOR CRUCIATE LIGAMENT DEBRIDEMENT;  Surgeon: [name], MD;  Location: [facility] ORS;  Service: Orthopedics;  Laterality: Left;  . LEFT HEART CATH AND CORONARY ANGIOGRAPHY N/A 11/08/2016   Procedure: Left Heart Cath and Coronary Angiography;  Surgeon: [name], MD;  Location: [address] CV LAB;  Service: Cardiovascular;  Laterality: N/A;  . lumbar back surgery  2012  . [place] SURGERY  1991  . TONSILLECTOMY    . TONSILLECTOMY AND ADENOIDECTOMY  1960s    Social History:  Social History   Socioeconomic History  . Marital status: Widowed    Spouse name: Not on file  . Number of children: 3  . Years of education: 75  . Highest education level: Not on file  Occupational History  . Occupation: Retired  Scientific laboratory technician  . Financial resource strain: Somewhat hard  . Food insecurity    Worry: Never true    Inability: Never true  . Transportation needs    Medical: No    Non-medical: No  Tobacco Use  . Smoking status: Never Smoker  . Smokeless tobacco: Never Used   Substance and Sexual Activity  . Alcohol use: No    Alcohol/week: 0.0 standard drinks  . Drug use: No  . Sexual activity: Not Currently  Lifestyle  . Physical activity    Days per week: 0 days    Minutes per session: 0 min  . Stress: Not at all  Relationships  . Social Herbalist on phone: Not on file    Gets together: Not on file    Attends religious service: Not on file    Active member of club or organization: Not on file    Attends meetings of clubs or organizations: Not on file    Relationship status: Widowed  . Intimate partner violence    Fear of current or ex partner: Not on file    Emotionally abused: Not on file    Physically abused: Not on file    Forced sexual activity: Not on file  Other Topics Concern  . Not on file  Social History Narrative   Born and raised  in [place], [place]. Currently resides in a house with her son. 1 dog. Fun: go to church   Divorced(Has total of #3 children)-[place], [place], Honor Junes   Denies religious beliefs that would effect health care.    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[employer]    Family History:  Family History  Problem Relation Age of Onset  . Stroke Mother   . Heart disease Mother   . Emphysema Father   . Ovarian cancer Sister   . Stroke Sister   . Other Child        died at birth    Medications:   Current Outpatient Medications on File Prior to Visit  Medication Sig Dispense Refill  . acetaminophen (TYLENOL) 500 MG tablet Take 500-1,000 mg by mouth daily as needed for mild pain or moderate pain.     [name] amLODipine (NORVASC) 5 MG tablet Take 1 tablet (5 mg total) by mouth daily. 90 tablet 3  . apixaban (ELIQUIS) 5 MG TABS tablet Take 1 tablet (5 mg total) by mouth 2 (two) times daily. 60 tablet 2  . atorvastatin (LIPITOR) 40 MG tablet Take 1 tablet (40 mg total) by mouth daily at 6 PM. 30 tablet 0  . carvedilol (COREG) 25 MG tablet TAKE  (1) TABLET BY MOUTH TWICE DAILY WITH A MEAL. (Patient taking differently: Take 25 mg by mouth 2 (two) times daily with a meal. ) 180 tablet 3  . furosemide (LASIX) 40 MG tablet TAKE 1 TABLET BY MOUTH ONCE DAILY. (Patient taking differently: Take 40 mg by mouth daily. ) 90 tablet 0  . meclizine (ANTIVERT) 25 MG tablet Take 1 tablet (25 mg total) by mouth as needed for dizziness or nausea. 90 tablet 1  . pantoprazole (PROTONIX) 40 MG tablet TAKE 1 TABLET BY MOUTH TWICE DAILY. (Patient taking differently: Take 40 mg by mouth 2 (two) times daily. ) 180 tablet 1  . Pediatric Multivitamins-Iron (FLINTSTONES PLUS IRON) chewable tablet Chew 2 tablets by mouth daily. 60 tablet 0  . temazepam (RESTORIL) 15 MG capsule TAKE (1) CAPSULE BY MOUTH AT BEDTIME. 30 capsule 2  . traMADol (ULTRAM) 50 MG tablet TAKE (1) TABLET BY MOUTH EVERY EIGHT HOURS. (Patient taking differently: Take 50 mg by mouth every 8 (eight) hours. ) 90 tablet 0   Current Facility-Administered Medications on File Prior to Visit  Medication Dose Route Frequency Provider Last Rate Last Dose  . cyanocobalamin ((VITAMIN B-12)) injection 1,000 mcg  1,000 mcg Intramuscular Once Eulas Post, MD        Allergies:   Allergies  Allergen Reactions  . Iodinated Diagnostic Agents Anaphylaxis and Other (See Comments)    IPD dye Info given by patient  . Iodine Anaphylaxis  . Ioxaglate Anaphylaxis and Other (See Comments)    Info given by patient  . Red Dye Anaphylaxis  . Lactose Intolerance (Gi)   . Whey Other (See Comments)    Lactose intolerance  . Hydralazine Anxiety and Other (See Comments)    Facial flushing, pt prefers not to use it.   . Milk-Related Compounds Other (See Comments)    Lactose intolerance Can tolerate milk if its cooked into the recipe, just can't drink milk   . Propoxyphene Rash and Anxiety     Physical Exam  Vitals:   04/24/19 1319  BP: 131/65  Pulse: 72  Temp: (!) 96.9 F (36.1 C)  Weight: 105 lb (47.6  kg)  Height: 5'  2" (1.575 m)   Body mass index is 19.2 kg/m. No exam data present   General: Frail pleasant elderly Caucasian female, seated, in no evident distress Head: head normocephalic and atraumatic.   Neck: supple with no carotid or supraclavicular bruits Cardiovascular: irregular rate and rhythm, no murmurs Musculoskeletal: no deformity Skin:  no rash/petichiae Vascular:  Normal pulses all extremities   Neurologic Exam Mental Status: Awake and fully alert.  Mild to moderate dysarthria. Oriented to place and time. Recent and remote memory intact. Attention span, concentration and fund of knowledge appropriate. Mood and affect appropriate.  Cranial Nerves: Fundoscopic exam reveals sharp disc margins. Pupils equal, briskly reactive to light. Extraocular movements full without nystagmus. Visual fields full to confrontation. Hearing intact. Facial sensation intact.  Mild left lower facial weakness Motor: Normal bulk and tone. Normal strength in all tested extremity muscles. Sensory.: intact to touch , pinprick , position and vibratory sensation.  Coordination: Rapid alternating movements normal in all extremities. Finger-to-nose and heel-to-shin performed accurately bilaterally. Gait and Station: Arises from chair without difficulty. Stance is normal. Gait demonstrates normal stride length and balance Reflexes: 1+ and symmetric. Toes downgoing.     NIHSS  2 Modified Rankin  2 CHA2DS2-VASc 7 HAS-BLED 3   Diagnostic Data (Labs, Imaging, Testing)  CT HEAD WO CONTRAST 03/06/2019 IMPRESSION: 1. Question loss of gray-white differentiation in the right frontal operculum, M1 sector. No evidence of hemorrhage or mass effect. Old infarctions in the right cerebellum and right radiating white matter tracts. 2. ASPECTS is 9  MR BRAIN WO CONTRAST MR MRA HEAD  MR MRA NECK 03/07/2019 IMPRESSION: MRI HEAD IMPRESSION:  1. Patchy small volume acute ischemic nonhemorrhagic right MCA  territory infarct involving the right frontal operculum. 2. Underlying moderate chronic microvascular ischemic disease with scattered remote lacunar infarcts within the right basal ganglia and hemispheric cerebral white matter. 3. Superimposed chronic superior right cerebellar infarct.  MRA HEAD IMPRESSION:  1. Acute right M2 occlusion, consistent with the right MCA territory infarct. 2. Moderate atherosclerotic change elsewhere throughout the intracranial circulation. No other hemodynamically significant or correctable stenosis.  MRA NECK IMPRESSION:  1. Mild atheromatous irregularity about the carotid bifurcations/proximal ICAs bilaterally without hemodynamically significant or critical flow limiting stenosis. Both carotid artery systems otherwise widely patent within the neck. 2. Wide patency of the vertebral arteries within the neck. Left vertebral artery dominant  ECHOCARDIOGRAM 03/07/2019 IMPRESSIONS  1. The left ventricle has hyperdynamic systolic function, with an ejection fraction of >65%. The cavity size was normal. There is mildly increased left ventricular wall thickness. Left ventricular diastolic function could not be evaluated secondary to  atrial fibrillation. No evidence of left ventricular regional wall motion abnormalities.  2. The right ventricle has normal systolic function. The cavity was normal. There is no increase in right ventricular wall thickness.  3. Left atrial size was mildly dilated.  4. The mitral valve is abnormal. Mild thickening of the mitral valve leaflet. There is mild mitral annular calcification present.  5. The tricuspid valve is grossly normal.  6. The aortic valve is tricuspid. Moderate calcification of the aortic valve. Aortic valve regurgitation is trivial by color flow Doppler. Moderate stenosis of the aortic valve.  7. The aorta is normal unless otherwise noted.  8. When compared to the prior study: 02/16/18: LVEF 50-55%, mild to  moderate aortic stenosis.  9. The interatrial septum was not well visualized.    ASSESSMENT: [name] is a 78 y.o. year old female presented with slurred  speech and left facial droop on 03/06/2019 with stroke work-up showing right MCA territory infarct with right M2 occlusion secondary to known atrial fibrillation on Coumadin.  She was transitioned to Eliquis in addition to aspirin.  Vascular risk factors include HTN, HLD, CAD, CHF, A. fib, history of PE, history of prior stroke.  Residual deficits of mild to moderate dysarthria and dysphagia    PLAN:  1. Right MCA stroke: Continue Eliquis (apixaban) daily  and Vytorin for secondary stroke prevention. Maintain strict control of hypertension with blood pressure goal below 130/90, diabetes with hemoglobin A1c goal below 6.5% and cholesterol with LDL cholesterol (bad cholesterol) goal below 70 mg/dL.  I also advised the patient to eat a healthy diet with plenty of whole grains, cereals, fruits and vegetables, exercise regularly with at least 30 minutes of continuous activity daily and maintain ideal body weight. 2. Dysphagia/dysarthria: Ongoing participation with home health speech therapy with potential transition to outpatient therapy if indicated.  She questioned return to driving but advised her that this can be discussed at next follow-up visit due to residual cognitive impairment 3. HTN: Advised to continue current treatment regimen.  Today's BP 131/65.  Advised to continue to monitor at home along with continued follow-up with PCP for management 4. HLD: Advised to continue current treatment regimen along with continued follow-up with PCP for future prescribing and monitoring of lipid panel 5. Atrial fibrillation: Continue Eliquis and schedule follow-up visit with cardiology.  Advised patient that needed aspirin in addition to Eliquis will be determined by cardiology due to history of CAD    Follow up in 3 months or call earlier if needed    Greater than 50% of time during this 45 minute visit was spent on counseling, explanation of diagnosis of right MCA stroke, reviewing risk factor management of HTN, HLD and atrial fibrillation, planning of further management along with potential future management, and discussion with patient and family answering all questions.    Frann Rider, AGNP-BC  [facility] [address] [place], [place] [address]  Phone [phone] Fax [phone] Note: This document was prepared with digital dictation and possible smart phrase technology. Any transcriptional errors that result from this process are unintentional.

## 2019-04-24 NOTE — Patient Instructions (Signed)
Continue Eliquis (apixaban) daily  and Vytorin  for secondary stroke prevention  Continue to follow up with PCP regarding cholesterol and blood pressure management   Schedule follow up with cardiology for atrial fibrillation management  Continue to monitor blood pressure at home  Maintain strict control of hypertension with blood pressure goal below 130/90, diabetes with hemoglobin A1c goal below 6.5% and cholesterol with LDL cholesterol (bad cholesterol) goal below 70 mg/dL. I also advised the patient to eat a healthy diet with plenty of whole grains, cereals, fruits and vegetables, exercise regularly and maintain ideal body weight.  Followup in the future with me in 3 months or call earlier if needed       Thank you for coming to see [place] at [facility]. I hope we have been able to provide you high quality care today.  You may receive a patient satisfaction survey over the next few weeks. We would appreciate your feedback and comments so that we may continue to improve ourselves and the health of our patients.

## 2019-04-25 ENCOUNTER — Telehealth: Payer: Self-pay | Admitting: Adult Health

## 2019-04-25 NOTE — Telephone Encounter (Signed)
Medication: temazepam (RESTORIL) 15 MG capsule [id number]  Has the patient contacted their pharmacy? Yes  (Agent: If no, request that the patient contact the pharmacy for the refill.) (Agent: If yes, when and what did the pharmacy advise?)  Preferred Pharmacy (with phone number or street name): [facility], [place] [phone] (Phone) [phone] (Fax)    Agent: Please be advised that RX refills may take up to 3 business days. We ask that you follow-up with your pharmacy.

## 2019-04-25 NOTE — Telephone Encounter (Signed)
Filled on 03/18/2019 for 3 months.  Refills on file.

## 2019-04-29 DIAGNOSIS — R208 Other disturbances of skin sensation: Secondary | ICD-10-CM | POA: Diagnosis not present

## 2019-04-29 DIAGNOSIS — I69328 Other speech and language deficits following cerebral infarction: Secondary | ICD-10-CM | POA: Diagnosis not present

## 2019-04-29 DIAGNOSIS — I69398 Other sequelae of cerebral infarction: Secondary | ICD-10-CM | POA: Diagnosis not present

## 2019-04-29 DIAGNOSIS — R1312 Dysphagia, oropharyngeal phase: Secondary | ICD-10-CM | POA: Diagnosis not present

## 2019-04-29 DIAGNOSIS — I69391 Dysphagia following cerebral infarction: Secondary | ICD-10-CM | POA: Diagnosis not present

## 2019-04-29 DIAGNOSIS — I480 Paroxysmal atrial fibrillation: Secondary | ICD-10-CM | POA: Diagnosis not present

## 2019-05-01 ENCOUNTER — Other Ambulatory Visit: Payer: Self-pay | Admitting: *Deleted

## 2019-05-01 NOTE — Patient Outreach (Signed)
[facility]) Care Management  05/01/2019  JUREA CECCARELLI 1940-10-07 [id number]   Follow up call to  [facility]- stroke Patient- d/c from [place] Day # 13 Date: Thursday 03/27/19 [place] Reason: Questions/problems with meds? Yes   Insurance: NextGen Medicare and united healthcare medicare   [facility] admissions x 1 ED visits x in the last 6 months  Last admission 03/06/19 to 03/12/19    Outreach attempt to follow up on management of Eliquis and further assessment of any home medical needs No answer. [facility] RN CM left HIPAA compliant voicemail message along with CM's contact info.   Plan: [facility] RN CM scheduled this [facility] engaged patient for another call attempt within 7-10 business days if no return call from her per workflow  Brent. Lavina Hamman, RN, BSN, [place] Coordinator Office number [phone] Mobile number [phone]  Main [facility] number [phone] Fax number [phone]

## 2019-05-02 DIAGNOSIS — I69328 Other speech and language deficits following cerebral infarction: Secondary | ICD-10-CM | POA: Diagnosis not present

## 2019-05-02 DIAGNOSIS — I69391 Dysphagia following cerebral infarction: Secondary | ICD-10-CM | POA: Diagnosis not present

## 2019-05-02 DIAGNOSIS — R1312 Dysphagia, oropharyngeal phase: Secondary | ICD-10-CM | POA: Diagnosis not present

## 2019-05-02 DIAGNOSIS — I480 Paroxysmal atrial fibrillation: Secondary | ICD-10-CM | POA: Diagnosis not present

## 2019-05-02 DIAGNOSIS — I69398 Other sequelae of cerebral infarction: Secondary | ICD-10-CM | POA: Diagnosis not present

## 2019-05-02 DIAGNOSIS — R208 Other disturbances of skin sensation: Secondary | ICD-10-CM | POA: Diagnosis not present

## 2019-05-05 DIAGNOSIS — R1312 Dysphagia, oropharyngeal phase: Secondary | ICD-10-CM | POA: Diagnosis not present

## 2019-05-05 DIAGNOSIS — I69328 Other speech and language deficits following cerebral infarction: Secondary | ICD-10-CM | POA: Diagnosis not present

## 2019-05-05 DIAGNOSIS — I69391 Dysphagia following cerebral infarction: Secondary | ICD-10-CM | POA: Diagnosis not present

## 2019-05-05 DIAGNOSIS — I480 Paroxysmal atrial fibrillation: Secondary | ICD-10-CM | POA: Diagnosis not present

## 2019-05-05 DIAGNOSIS — I69398 Other sequelae of cerebral infarction: Secondary | ICD-10-CM | POA: Diagnosis not present

## 2019-05-05 DIAGNOSIS — R208 Other disturbances of skin sensation: Secondary | ICD-10-CM | POA: Diagnosis not present

## 2019-05-06 DIAGNOSIS — I69391 Dysphagia following cerebral infarction: Secondary | ICD-10-CM | POA: Diagnosis not present

## 2019-05-06 DIAGNOSIS — I69398 Other sequelae of cerebral infarction: Secondary | ICD-10-CM | POA: Diagnosis not present

## 2019-05-06 DIAGNOSIS — R208 Other disturbances of skin sensation: Secondary | ICD-10-CM | POA: Diagnosis not present

## 2019-05-06 DIAGNOSIS — R1312 Dysphagia, oropharyngeal phase: Secondary | ICD-10-CM | POA: Diagnosis not present

## 2019-05-06 DIAGNOSIS — I480 Paroxysmal atrial fibrillation: Secondary | ICD-10-CM | POA: Diagnosis not present

## 2019-05-06 DIAGNOSIS — I69328 Other speech and language deficits following cerebral infarction: Secondary | ICD-10-CM | POA: Diagnosis not present

## 2019-05-08 ENCOUNTER — Ambulatory Visit: Payer: Medicare Other | Admitting: Cardiology

## 2019-05-08 ENCOUNTER — Ambulatory Visit: Payer: Medicare Other | Admitting: Medical

## 2019-05-08 ENCOUNTER — Encounter: Payer: Self-pay | Admitting: Cardiology

## 2019-05-08 ENCOUNTER — Other Ambulatory Visit: Payer: Self-pay

## 2019-05-08 ENCOUNTER — Ambulatory Visit (INDEPENDENT_AMBULATORY_CARE_PROVIDER_SITE_OTHER): Payer: Medicare Other | Admitting: Cardiology

## 2019-05-08 VITALS — BP 140/62 | HR 79 | Temp 97.9°F | Ht 62.0 in | Wt 105.6 lb

## 2019-05-08 DIAGNOSIS — Z7901 Long term (current) use of anticoagulants: Secondary | ICD-10-CM | POA: Diagnosis not present

## 2019-05-08 DIAGNOSIS — I2510 Atherosclerotic heart disease of native coronary artery without angina pectoris: Secondary | ICD-10-CM | POA: Diagnosis not present

## 2019-05-08 DIAGNOSIS — I10 Essential (primary) hypertension: Secondary | ICD-10-CM

## 2019-05-08 DIAGNOSIS — I63411 Cerebral infarction due to embolism of right middle cerebral artery: Secondary | ICD-10-CM | POA: Diagnosis not present

## 2019-05-08 DIAGNOSIS — I480 Paroxysmal atrial fibrillation: Secondary | ICD-10-CM

## 2019-05-08 NOTE — Patient Instructions (Signed)
Medication Instructions:  Your physician recommends that you continue on your current medications as directed. Please refer to the Current Medication list given to you today. *If you need a refill on your cardiac medications before your next appointment, please call your pharmacy*  Lab Work: Your physician recommends that you return for lab work in: [facility] If you have labs (blood work) drawn today and your tests are completely normal, you will receive your results only by: [name] MyChart Message (if you have MyChart) OR . A paper copy in the mail If you have any lab test that is abnormal or we need to change your treatment, we will call you to review the results.  Testing/Procedures: NONE   Follow-Up: At [facility], you and your health needs are our priority.  As part of our continuing mission to provide you with exceptional heart care, we have created designated Provider Care Teams.  These Care Teams include your primary Cardiologist (physician) and Advanced Practice Providers (APPs -  Physician Assistants and Nurse Practitioners) who all work together to provide you with the care you need, when you need it.  Your next appointment:   4-6 WEEKS   The format for your next appointment:   In Person  Provider:   K. Mali Hilty, MD  Other Instructions

## 2019-05-08 NOTE — Assessment & Plan Note (Signed)
Recurrent AF with fatigue (and CVA 03/06/2019 in setting of low INR)

## 2019-05-08 NOTE — Assessment & Plan Note (Signed)
Now on Eliquis- for now I suggested she stay off ASA 81mg  secondary to epistaxis.

## 2019-05-08 NOTE — Assessment & Plan Note (Signed)
Moderate CAD involving dRCA and RI in 2018- medical rx

## 2019-05-08 NOTE — Assessment & Plan Note (Signed)
Fair control.

## 2019-05-08 NOTE — Progress Notes (Signed)
Cardiology Office Note:    Date:  05/08/2019   ID:  Miranda, [name] 1941/02/17, MRN [id number]  PCP:  Dorothyann Peng, NP  Cardiologist:  Pixie Casino, MD  Electrophysiologist:  None   Referring MD: Dorothyann Peng, NP   No chief complaint on file. fatigue s/p CVA  History of Present Illness:    [name] is a 78 y.o. female with a hx of moderate aortic stenosis, PAF previously on Coumadin therapy, moderate coronary disease at catheterization treated medically in 2018, and hypertension.  She is somewhat frail.  She was admitted to the hospital in September with difficulty in speech and ruled in for an R MCA stroke.  Echocardiogram 03/07/2019 showed normal to hyperdynamic LV function with an EF of greater than 65%.  She had mild left atrial dilatation.  There was moderate aortic stenosis.  When she was admitted her INR was subtherapeutic.  She was changed from Coumadin to Eliquis and aspirin.  After discharge she had what she describes as significant epistaxis on aspirin and Eliquis and she stopped the aspirin.  She is in the office today for cardiology follow-up.  Her main symptoms on arrival with her stroke were difficulty in speech and trouble swallowing.  She is having some trouble taking her Lipitor, she is breaking this in half I told her this was okay.  Her daughter notes that the patient is significantly fatigued since discharge.  Coming to the office "washes her out".  EKG in the office today shows atrial fibrillation with a ventricular response of 86. When we saw her last in June 2020 she was in NSR.    Past Medical History:  Diagnosis Date  . Anemia 2005   Generally microcytic, transfusions in [address], 2012, 02/2015, 05/2015  . Aortic stenosis    Moderate November 2017  . Arthritis   . Broken back 2013   Chronic back pain.   [name] CAD (coronary artery disease)    Cardiac catheterization 2014 - 80% mid RCA and 70% OM managed medically  . CHF (congestive heart failure)  ([place])   . Chronic bilateral pleural effusions   . Chronic headache   . Contrast media allergy   . Diastolic heart failure ([place])   . Esophageal cancer ([name]) 05/06/2015   Adenocarcinoma GE junction  . Facial paresthesia   . GERD (gastroesophageal reflux disease)   . H. pylori infection   . H/O iron deficiency    05-06-15 iron infusion ([place])  . HH (hiatus hernia) 2008   Large with associated erosions  . Hypertension   . Paroxysmal atrial fibrillation ([name])   . PONV (postoperative nausea and vomiting)   . Pulmonary emboli ([place]) 2008, 2012  . Stroke [facility]) 1982    Past Surgical History:  Procedure Laterality Date  . APPENDECTOMY  1950s  . BACK SURGERY    . CARPAL TUNNEL RELEASE Bilateral 1990s  . [place] SURGERY  2014  . CHOLECYSTECTOMY  1980s   open  . COLONOSCOPY N/A 02/17/2015   Procedure: COLONOSCOPY;  Surgeon: [name], MD;  Location: [facility] ENDOSCOPY;  Service: Endoscopy;  Laterality: N/A;  . ESOPHAGOGASTRODUODENOSCOPY N/A 02/16/2015   Procedure: ESOPHAGOGASTRODUODENOSCOPY (EGD);  Surgeon: [name], MD;  Location: [facility];  Service: Endoscopy;  Laterality: N/A;  . ESOPHAGOGASTRODUODENOSCOPY N/A 05/06/2015   Procedure: ESOPHAGOGASTRODUODENOSCOPY (EGD);  Surgeon: [name], MD;  Location: [facility] ENDOSCOPY;  Service: Endoscopy;  Laterality: N/A;  . EUS N/A 05/20/2015   Procedure: UPPER ENDOSCOPIC ULTRASOUND (EUS)  LINEAR;  Surgeon: [name], MD;  Location: [facility];  Service: Endoscopy;  Laterality: N/A;  . exploratory lab  1950s or 1960s  . GIVENS CAPSULE STUDY N/A 05/06/2015   Procedure: GIVENS CAPSULE STUDY;  Surgeon: [name], MD;  Location[facility] ENDOSCOPY;  Service: Endoscopy;  Laterality: N/A;  . KNEE ARTHROSCOPY WITH MEDIAL MENISECTOMY Left 04/18/2018   Procedure: LEFT KNEE ARTHROSCOPY WITH PARTIAL MEDIAL MENISECTOMY AND ANTERIOR CRUCIATE LIGAMENT DEBRIDEMENT;  Surgeon: [name], MD;  Location: [facility];  Service: Orthopedics;   Laterality: Left;  . LEFT HEART CATH AND CORONARY ANGIOGRAPHY N/A 11/08/2016   Procedure: Left Heart Cath and Coronary Angiography;  Surgeon: [name], MD;  Location: [place] CV LAB;  Service: Cardiovascular;  Laterality: N/A;  . lumbar back surgery  2012  . [place] SURGERY  1991  . TONSILLECTOMY    . TONSILLECTOMY AND ADENOIDECTOMY  1960s    Current Medications: Current Meds  Medication Sig  . acetaminophen (TYLENOL) 500 MG tablet Take 500-1,000 mg by mouth daily as needed for mild pain or moderate pain.   [name] amLODipine (NORVASC) 5 MG tablet Take 1 tablet (5 mg total) by mouth daily.  [name] apixaban (ELIQUIS) 5 MG TABS tablet Take 1 tablet (5 mg total) by mouth 2 (two) times daily.  [name] atorvastatin (LIPITOR) 40 MG tablet Take 1 tablet (40 mg total) by mouth daily at 6 PM.  . carvedilol (COREG) 25 MG tablet Take 1 tablet (25 mg total) by mouth 2 (two) times daily with a meal.  . furosemide (LASIX) 40 MG tablet TAKE 1 TABLET BY MOUTH ONCE DAILY.  . meclizine (ANTIVERT) 25 MG tablet Take 1 tablet (25 mg total) by mouth as needed for dizziness or nausea.  . pantoprazole (PROTONIX) 40 MG tablet TAKE 1 TABLET BY MOUTH TWICE DAILY.  [name] Pediatric Multivitamins-Iron (FLINTSTONES PLUS IRON) chewable tablet Chew 2 tablets by mouth daily.  . temazepam (RESTORIL) 15 MG capsule TAKE (1) CAPSULE BY MOUTH AT BEDTIME.  . traMADol (ULTRAM) 50 MG tablet TAKE (1) TABLET BY MOUTH EVERY EIGHT HOURS.   Current Facility-Administered Medications for the 05/08/19 encounter (Office Visit) with Erlene Quan, PA-C  Medication  . cyanocobalamin ((VITAMIN B-12)) injection 1,000 mcg     Allergies:   Iodinated diagnostic agents, Iodine, Ioxaglate, Red dye, Lactose intolerance (gi), Whey, Hydralazine, Milk-related compounds, and Propoxyphene   Social History   Socioeconomic History  . Marital status: Widowed    Spouse name: Not on file  . Number of children: 3  . Years of education: 58  . Highest  education level: Not on file  Occupational History  . Occupation: Retired  Scientific laboratory technician  . Financial resource strain: Somewhat hard  . Food insecurity    Worry: Never true    Inability: Never true  . Transportation needs    Medical: No    Non-medical: No  Tobacco Use  . Smoking status: Never Smoker  . Smokeless tobacco: Never Used  Substance and Sexual Activity  . Alcohol use: No    Alcohol/week: 0.0 standard drinks  . Drug use: No  . Sexual activity: Not Currently  Lifestyle  . Physical activity    Days per week: 0 days    Minutes per session: 0 min  . Stress: Not at all  Relationships  . Social Herbalist on phone: Not on file    Gets together: Not on file    Attends religious service: Not on  file    Active member of club or organization: Not on file    Attends meetings of clubs or organizations: Not on file    Relationship status: Widowed  Other Topics Concern  . Not on file  Social History Narrative   Born and raised in [place], [place]. Currently resides in a house with her son. 1 dog. Fun: go to church   Divorced(Has total of #3 children)-[place], [place], [place]   Denies religious beliefs that would effect health care.    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[employer]     Family History: The patient's family history includes Emphysema in her father; Heart disease in her mother; Other in her child; Ovarian cancer in her sister; Stroke in her mother and sister.  ROS:   Please see the history of present illness.     All other systems reviewed and are negative.  EKGs/Labs/Other Studies Reviewed:    The following studies were reviewed today: Echo sept 2020  EKG:  EKG is ordered today.  The ekg ordered today demonstrates AF with VR 8  Recent Labs: 03/06/2019: ALT 15 03/12/2019: Magnesium 1.9 03/18/2019: BUN 22; Creatinine, Ser 1.14; Hemoglobin 14.2; Platelets 248.0; Potassium 4.2; Sodium  141  Recent Lipid Panel    Component Value Date/Time   CHOL 152 03/08/2019 0334   TRIG 70 03/08/2019 0334   HDL 45 03/08/2019 0334   CHOLHDL 3.4 03/08/2019 0334   VLDL 14 03/08/2019 0334   LDLCALC 93 03/08/2019 0334    Physical Exam:    VS:  BP 140/62   Pulse 79   Temp 97.9 F (36.6 C)   Ht 5\' 2"  (1.575 m)   Wt 105 lb 9.6 oz (47.9 kg)   SpO2 98%   BMI 19.31 kg/m     Wt Readings from Last 3 Encounters:  05/08/19 105 lb 9.6 oz (47.9 kg)  04/24/19 105 lb (47.6 kg)  03/18/19 103 lb 9.6 oz (47 kg)     GEN: Elderly, thin, frail, female, well developed in no acute distress HEENT: Normal NECK: No JVD; No carotid bruits LYMPHATICS: No lymphadenopathy CARDIAC: irregularly irregular, no murmurs, rubs, gallops RESPIRATORY:  Clear to auscultation without rales, wheezing or rhonchi  ABDOMEN: Soft, non-tender, non-distended MUSCULOSKELETAL:  No edema; No deformity  SKIN: Warm and dry NEUROLOGIC:  Alert and oriented x 3 PSYCHIATRIC:  Normal affect   ASSESSMENT:    PAF (paroxysmal atrial fibrillation) ([name]) Recurrent AF with fatigue (and CVA 03/06/2019 in setting of low INR)  Long term current use of anticoagulant therapy Now on Eliquis- for now I suggested she stay off ASA 81mg  secondary to epistaxis.   Stroke [facility]) [facility] CVA 03/06/2019  Essential hypertension, benign Fair control  CAD (coronary artery disease) Moderate CAD involving dRCA and RI in 2018- medical rx  PLAN:    Will review with Dr Debara Pickett- not sure she is a candidate for cardioversion with her recent stroke, she appears to be pretty frail in general.  Check CBC today to r/o significant anemia as a cause of her fatigue.    Medication Adjustments/Labs and Tests Ordered: Current medicines are reviewed at length with the patient today.  Concerns regarding medicines are outlined above.  Orders Placed This Encounter  Procedures  . EKG 12-Lead   No orders of the defined types were placed in this  encounter.   There are no Patient Instructions on file for this visit.   Signed, Kerin Ransom,  PA-C  05/08/2019 2:54 PM    [facility]

## 2019-05-08 NOTE — Assessment & Plan Note (Signed)
RMCA CVA 03/06/2019

## 2019-05-09 LAB — CBC WITH DIFFERENTIAL/PLATELET
Basophils Absolute: 0 10*3/uL (ref 0.0–0.2)
Basos: 0 %
EOS (ABSOLUTE): 0.1 10*3/uL (ref 0.0–0.4)
Eos: 2 %
Hematocrit: 35.7 % (ref 34.0–46.6)
Hemoglobin: 12 g/dL (ref 11.1–15.9)
Immature Grans (Abs): 0 10*3/uL (ref 0.0–0.1)
Immature Granulocytes: 0 %
Lymphocytes Absolute: 1.8 10*3/uL (ref 0.7–3.1)
Lymphs: 31 %
MCH: 29.3 pg (ref 26.6–33.0)
MCHC: 33.6 g/dL (ref 31.5–35.7)
MCV: 87 fL (ref 79–97)
Monocytes Absolute: 0.5 10*3/uL (ref 0.1–0.9)
Monocytes: 8 %
Neutrophils Absolute: 3.4 10*3/uL (ref 1.4–7.0)
Neutrophils: 59 %
Platelets: 164 10*3/uL (ref 150–450)
RBC: 4.1 x10E6/uL (ref 3.77–5.28)
RDW: 12.6 % (ref 11.7–15.4)
WBC: 5.8 10*3/uL (ref 3.4–10.8)

## 2019-05-11 IMAGING — MR MR MRA HEAD W/O CM
9 of 10 series · 35 of 48 positions shown · non-contrast
Comparison: CT head 12/19/2017.  MRI brain 08/24/2014.

CLINICAL DATA: Altered mental status for 2 days. Focal neuro
deficit for greater than 6 hours. Stroke suspected. The patient
presented to the emergency department yesterday for chest pain and
shortness of breath. She also complains of right-sided facial
numbness beginning when she woke up yesterday.

EXAM:
MRI HEAD WITHOUT CONTRAST
MRA HEAD WITHOUT CONTRAST
TECHNIQUE: Multiplanar, multiecho pulse sequences of the brain and surrounding
structures were obtained without intravenous contrast. Angiographic
images of the head were obtained using MRA technique without
contrast.

[Series 2: [id number] · sagittal · 5.0mm · 0.40mm/px · 2 of 20 slices shown]
[im 1/20]
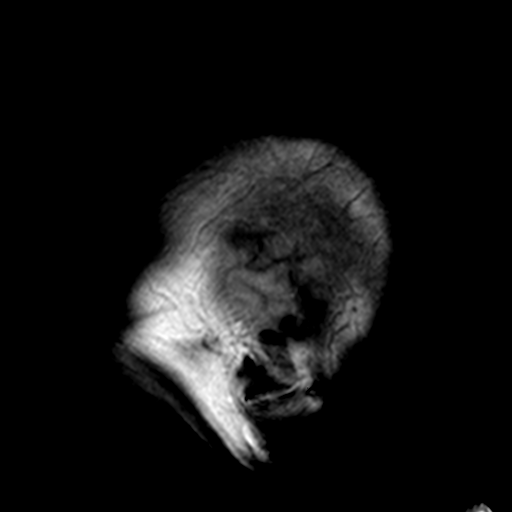
[im 20/20]
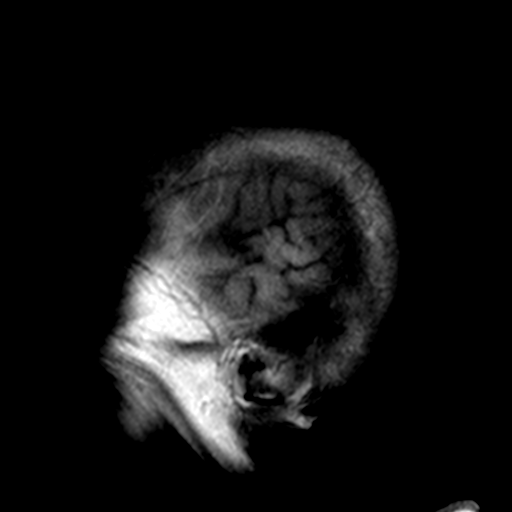

[Series 3: DWI · axial · 3.0mm · 0.65mm/px · z∈[-68,+88]mm · 6 of 51 slices shown (1 of 4)]
[im 1/51]
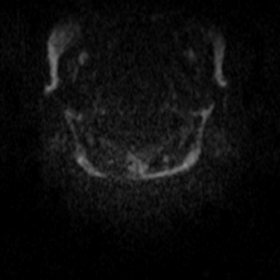
[im 11/51]
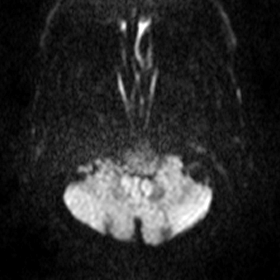
[im 21/51]
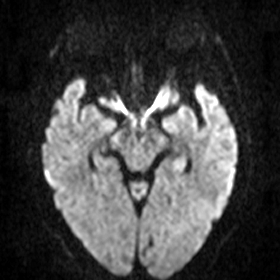
[im 31/51]
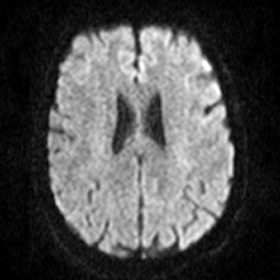
[im 41/51]
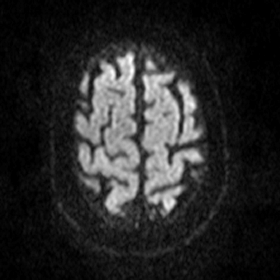
[im 51/51]
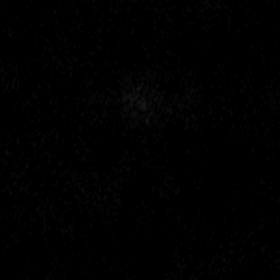

[Series 4: DWI · axial · 3.0mm · 0.72mm/px · z∈[-67,+90]mm · 6 of 55 slices shown (2 of 4)]
[im 1/55]
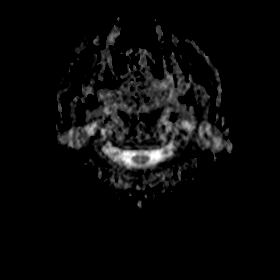
[im 11/55]
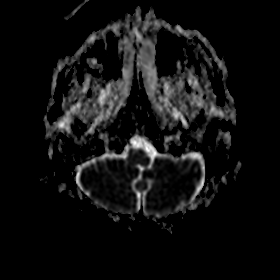
[im 22/55]
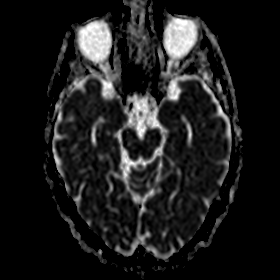
[im 33/55]
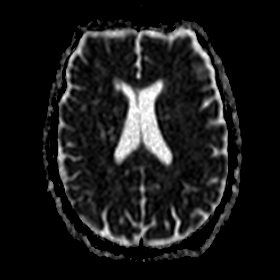
[im 44/55]
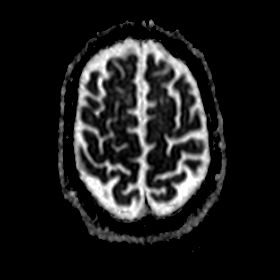
[im 55/55]
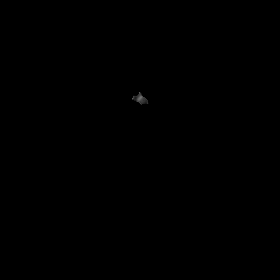

[Series 5: DWI · coronal · 5.0mm · 0.45mm/px · 4 of 34 slices shown (3 of 4)]
[im 1/34]
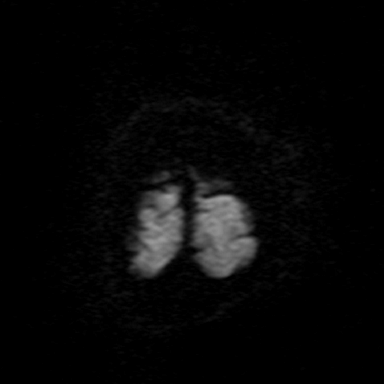
[im 12/34]
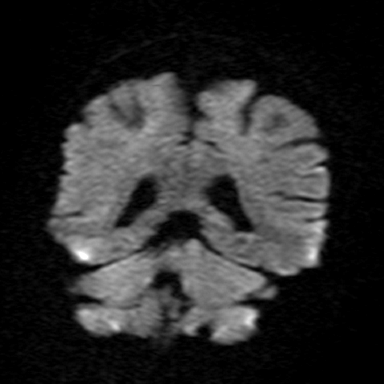
[im 23/34]
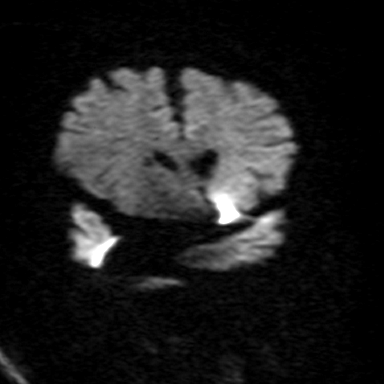
[im 34/34]
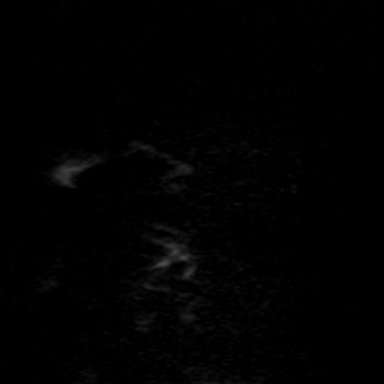

[Series 6: DWI · coronal · 5.0mm · 0.50mm/px · 4 of 34 slices shown (4 of 4)]
[im 1/34]
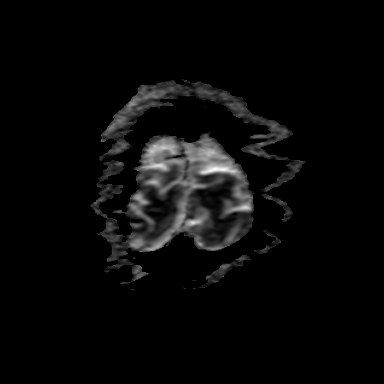
[im 12/34]
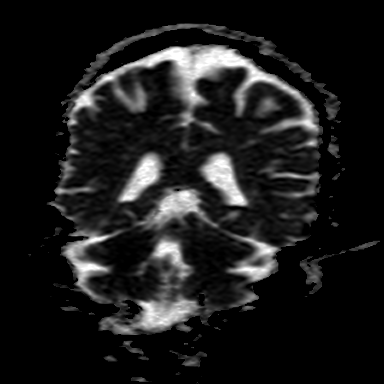
[im 23/34]
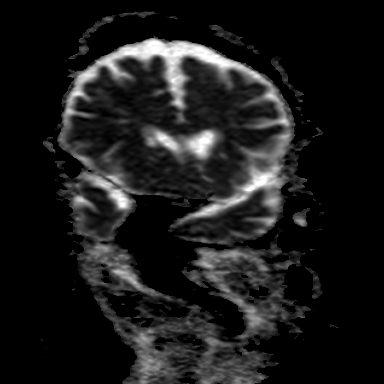
[im 34/34]
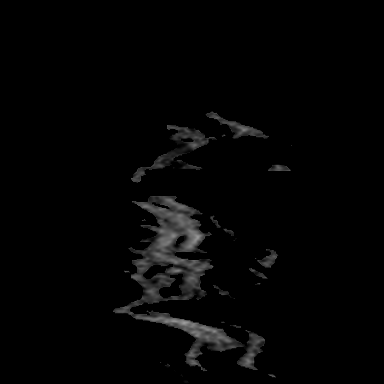

[Series 7: MRA · axial · 0.8mm · 0.33mm/px · z∈[-43,-12]mm · 3 of 131 slices shown]
[im 1/131]
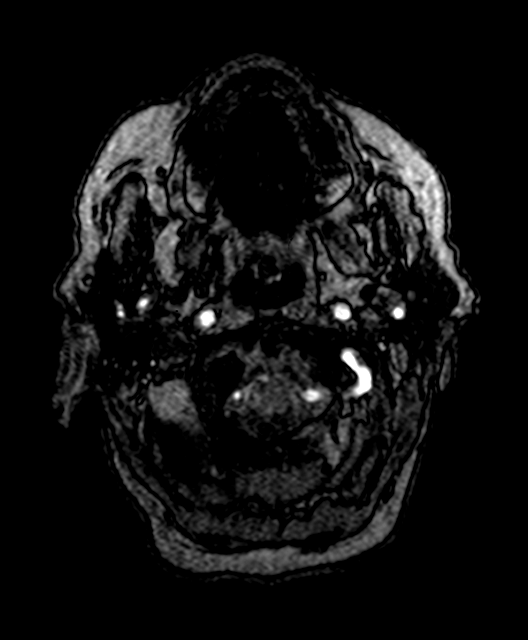
[im 21/131]
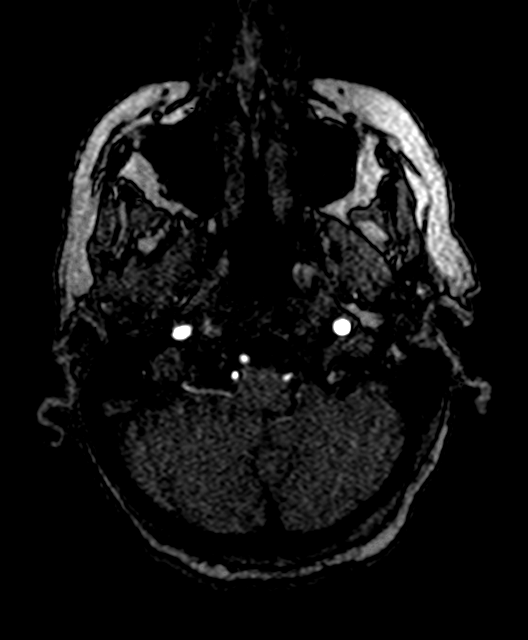
[im 41/131]
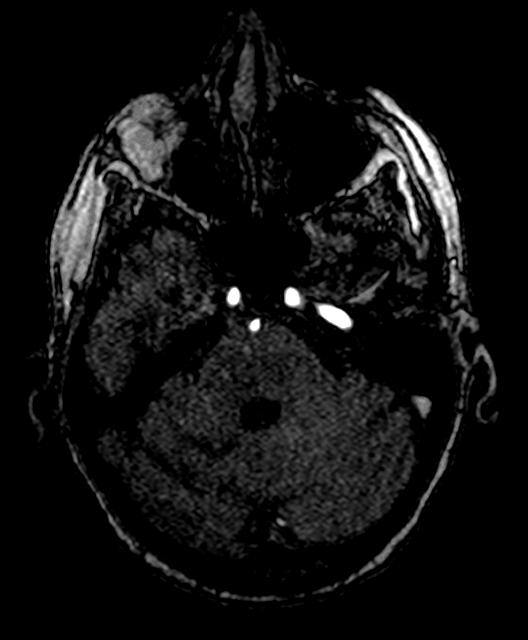

[Series 11: T2 · axial · 5.0mm · 0.62mm/px · z∈[-58,+80]mm · 2 of 23 slices shown (1 of 2)]
[im 1/23]
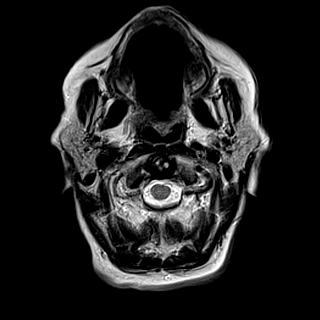
[im 23/23]
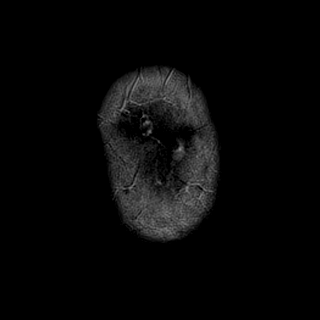

[Series 12: FLAIR · axial · 3.0mm · 0.77mm/px · z∈[-56,+78]mm · 5 of 47 slices shown]
[im 1/47]
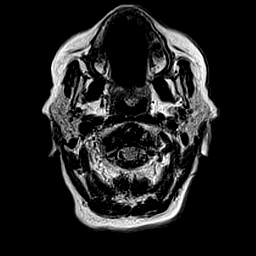
[im 12/47]
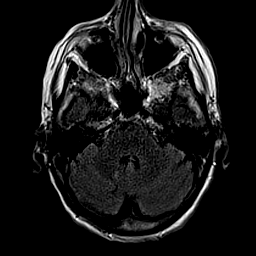
[im 24/47]
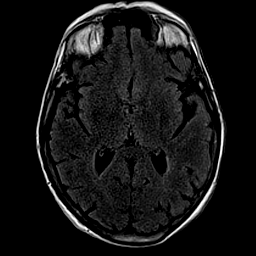
[im 35/47]
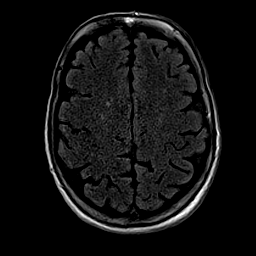
[im 47/47]
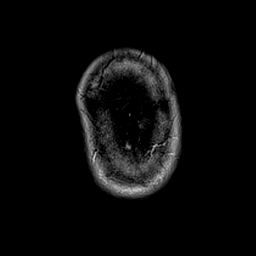

[Series 15: T2 · coronal · 5.0mm · 0.62mm/px · 3 of 28 slices shown (2 of 2)]
[im 1/28]
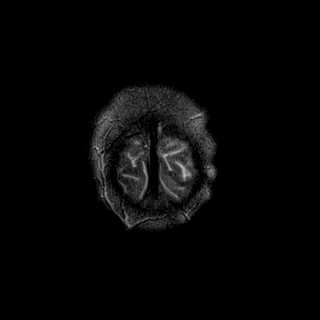
[im 14/28]
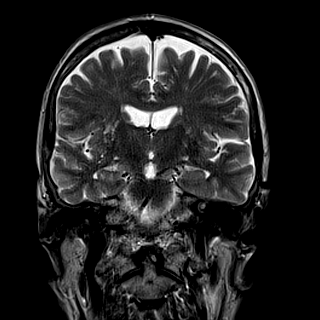
[im 28/28]
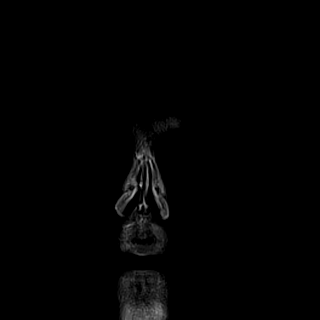

[35 of 48 positions shown; findings below may reference images not displayed]

FINDINGS: MRI HEAD FINDINGS

Brain: The diffusion-weighted images demonstrate no acute or
subacute infarct. Remote infarct of the right cerebellum is noted.
Remote lacunar infarcts of the right basal ganglia are stable. Mild
atrophy and white matter changes are present otherwise. No acute
hemorrhage or mass lesion is present. The internal auditory canals
are within normal limits. The brainstem and cerebellum are otherwise
within normal limits.

Vascular: Flow is present in the major intracranial arteries.

Skull and upper cervical spine: The skull base is within normal
limits. The upper cervical spine is unremarkable.

Sinuses/Orbits: The paranasal sinuses are clear. Mastoid air cells
are clear. Bilateral lens replacements are again noted. Globes and
orbits are otherwise within normal limits.

MRA HEAD FINDINGS

The internal carotid arteries are within normal limits from the high
cervical segments through the ICA termini bilaterally. Mild
narrowing of the mid left M1 segment. A1 and M1 segments are
otherwise within normal limits. The anterior communicating artery is
patent. There is a moderate stenosis of the distal left A2 segment.
MCA bifurcations are intact. Irregularity is present throughout ACA
and MCA branch vessels without other proximal stenosis or occlusion.

The left vertebral artery is slightly dominant to the right. PICA
origins are visualized and normal. Basilar artery is normal. Both
posterior cerebral arteries originate from the basilar tip. There is
mild narrowing of the proximal P2 vessels bilaterally. PCA branch
vessels are otherwise intact.
IMPRESSION: 1. No acute intracranial abnormality.
2. Stable remote infarcts involving the right basal ganglia and
right cerebellum.
3. Mild atrophy and white matter changes likely reflect the sequela
of chronic microvascular ischemia, these are stable.
4. Mild narrowing of the left M1 segment without a significant
stenosis relative to the more distal vessel.
5. Moderate distal left A2 segment stenosis.
6. Mild narrowing of the proximal P2 segments bilaterally.
7. No other significant proximal stenosis, aneurysm, or branch
vessel occlusion within the circle-of-Willis.

## 2019-05-13 DIAGNOSIS — I69328 Other speech and language deficits following cerebral infarction: Secondary | ICD-10-CM | POA: Diagnosis not present

## 2019-05-13 DIAGNOSIS — I69398 Other sequelae of cerebral infarction: Secondary | ICD-10-CM | POA: Diagnosis not present

## 2019-05-13 DIAGNOSIS — R208 Other disturbances of skin sensation: Secondary | ICD-10-CM | POA: Diagnosis not present

## 2019-05-13 DIAGNOSIS — R1312 Dysphagia, oropharyngeal phase: Secondary | ICD-10-CM | POA: Diagnosis not present

## 2019-05-13 DIAGNOSIS — I480 Paroxysmal atrial fibrillation: Secondary | ICD-10-CM | POA: Diagnosis not present

## 2019-05-13 DIAGNOSIS — I69391 Dysphagia following cerebral infarction: Secondary | ICD-10-CM | POA: Diagnosis not present

## 2019-05-15 ENCOUNTER — Ambulatory Visit (INDEPENDENT_AMBULATORY_CARE_PROVIDER_SITE_OTHER): Payer: Medicare Other | Admitting: Adult Health

## 2019-05-15 ENCOUNTER — Other Ambulatory Visit: Payer: Self-pay

## 2019-05-15 ENCOUNTER — Encounter: Payer: Self-pay | Admitting: Adult Health

## 2019-05-15 VITALS — BP 130/54 | Temp 98.0°F | Wt 105.0 lb

## 2019-05-15 DIAGNOSIS — I480 Paroxysmal atrial fibrillation: Secondary | ICD-10-CM | POA: Diagnosis not present

## 2019-05-15 DIAGNOSIS — I2510 Atherosclerotic heart disease of native coronary artery without angina pectoris: Secondary | ICD-10-CM | POA: Diagnosis not present

## 2019-05-15 DIAGNOSIS — I63411 Cerebral infarction due to embolism of right middle cerebral artery: Secondary | ICD-10-CM | POA: Diagnosis not present

## 2019-05-15 DIAGNOSIS — E782 Mixed hyperlipidemia: Secondary | ICD-10-CM

## 2019-05-15 DIAGNOSIS — R5383 Other fatigue: Secondary | ICD-10-CM | POA: Diagnosis not present

## 2019-05-15 DIAGNOSIS — I10 Essential (primary) hypertension: Secondary | ICD-10-CM | POA: Diagnosis not present

## 2019-05-15 MED ORDER — ATORVASTATIN CALCIUM 40 MG PO TABS: 40.0000 mg | ORAL_TABLET | Freq: Every day | ORAL | 3 refills | Status: DC

## 2019-05-15 NOTE — Progress Notes (Signed)
Subjective:    Patient ID: [name], female    DOB: Jul 17, 1940, 78 y.o.   MRN: [id number]  HPI 78 year old female who  has a past medical history of Anemia (2005), Aortic stenosis, Arthritis, Broken back (2013), CAD (coronary artery disease), CHF (congestive heart failure) ([name]), Chronic bilateral pleural effusions, Chronic headache, Contrast media allergy, Diastolic heart failure ([name]), Esophageal cancer ([name]) (05/06/2015), Facial paresthesia, GERD (gastroesophageal reflux disease), H. pylori infection, H/O iron deficiency, HH (hiatus hernia) (2008), Hypertension, Paroxysmal atrial fibrillation ([name]), PONV (postoperative nausea and vomiting), Pulmonary emboli ([name]) (2008, 2012), and Stroke [facility]) (1982).  He was admitted to the [name] in September 2020 with speech difficulty and was diagnosed with a right MCA stroke.    Her daughter who is with her today mentions that they were seen by cardiology last week and her EKG showed that she was back in atrial fibrillation.  On review of her EKG, this is true she had a ventricular response of 86.  She continues to feel fatigued and states "just coming to the office today wiped me out".  She denies chest pain or shortness of breath.  She has completed physical PT/OT/speech speech therapy.  She does feel improved but is hesitant for these therapies to and.  Has not had any falls since being discharged from the [name].  He continues to have trouble swallowing some foods and medications, especially Lipitor but since she has been cutting this medication in half its been less of an issue.   Review of Systems Past Medical History:  Diagnosis Date  . Anemia 2005   Generally microcytic, transfusions in [address], 2012, 02/2015, 05/2015  . Aortic stenosis    Moderate November 2017  . Arthritis   . Broken back 2013   Chronic back pain.   [name] CAD (coronary artery disease)    Cardiac catheterization 2014 - 80% mid RCA and 70% OM managed medically  . CHF  (congestive heart failure) ([name])   . Chronic bilateral pleural effusions   . Chronic headache   . Contrast media allergy   . Diastolic heart failure ([name])   . Esophageal cancer ([name]) 05/06/2015   Adenocarcinoma GE junction  . Facial paresthesia   . GERD (gastroesophageal reflux disease)   . H. pylori infection   . H/O iron deficiency    05-06-15 iron infusion ([name])  . HH (hiatus hernia) 2008   Large with associated erosions  . Hypertension   . Paroxysmal atrial fibrillation ([name])   . PONV (postoperative nausea and vomiting)   . Pulmonary emboli ([name]) 2008, 2012  . Stroke [name]) 1982    Social History   Socioeconomic History  . Marital status: Widowed    Spouse name: Not on file  . Number of children: 3  . Years of education: 77  . Highest education level: Not on file  Occupational History  . Occupation: Retired  Scientific laboratory technician  . Financial resource strain: Somewhat hard  . Food insecurity    Worry: Never true    Inability: Never true  . Transportation needs    Medical: No    Non-medical: No  Tobacco Use  . Smoking status: Never Smoker  . Smokeless tobacco: Never Used  Substance and Sexual Activity  . Alcohol use: No    Alcohol/week: 0.0 standard drinks  . Drug use: No  . Sexual activity: Not Currently  Lifestyle  . Physical activity    Days per week: 0 days    Minutes per  session: 0 min  . Stress: Not at all  Relationships  . Social Herbalist on phone: Not on file    Gets together: Not on file    Attends religious service: Not on file    Active member of club or organization: Not on file    Attends meetings of clubs or organizations: Not on file    Relationship status: Widowed  . Intimate partner violence    Fear of current or ex partner: Not on file    Emotionally abused: Not on file    Physically abused: Not on file    Forced sexual activity: Not on file  Other Topics Concern  . Not on file  Social History Narrative   Born and raised in  [place], [place]. Currently resides in a house with her son. 1 dog. Fun: go to church   Divorced(Has total of #3 children)-[place], [place], [place] Jun[place]   Denies religious beliefs that would effect health care.    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[employer]    Past Surgical History:  Procedure Laterality Date  . APPENDECTOMY  1950s  . BACK SURGERY    . CARPAL TUNNEL RELEASE Bilateral 1990s  . [place] SURGERY  2014  . CHOLECYSTECTOMY  1980s   open  . COLONOSCOPY N/A 02/17/2015   Procedure: COLONOSCOPY;  Surgeon: [name], MD;  Location: [facility] ENDOSCOPY;  Service: Endoscopy;  Laterality: N/A;  . ESOPHAGOGASTRODUODENOSCOPY N/A 02/16/2015   Procedure: ESOPHAGOGASTRODUODENOSCOPY (EGD);  Surgeon: [name], MD;  Location: [facility];  Service: Endoscopy;  Laterality: N/A;  . ESOPHAGOGASTRODUODENOSCOPY N/A 05/06/2015   Procedure: ESOPHAGOGASTRODUODENOSCOPY (EGD);  Surgeon: [name], MD;  Location: [facility] ENDOSCOPY;  Service: Endoscopy;  Laterality: N/A;  . EUS N/A 05/20/2015   Procedure: UPPER ENDOSCOPIC ULTRASOUND (EUS) LINEAR;  Surgeon: [name], MD;  Location: [facility] ENDOSCOPY;  Service: Endoscopy;  Laterality: N/A;  . exploratory lab  1950s or 1960s  . GIVENS CAPSULE STUDY N/A 05/06/2015   Procedure: GIVENS CAPSULE STUDY;  Surgeon: [name], MD;  Location[name] END[facility]COPY;  Service: Endoscopy;  Laterality: N/A;  . KNEE ARTHROSCOPY WITH MEDIAL MENISECTOMY Left 04/18/2018   Procedure: LEFT KNEE ARTHROSCOPY WITH PARTIAL MEDIAL MENISECTOMY AND ANTERIOR CRUCIATE LIGAMENT DEBRIDEMENT;  Surgeon: [name], MD;  Location: [facility] ORS;  Service: Orthopedics;  Laterality: Left;  . LEFT HEART CATH AND CORONARY ANGIOGRAPHY N/A 11/08/2016   Procedure: Left Heart Cath and Coronary Angiography;  Surgeon: [name], MD;  Location: [facility] CV LAB;  Service: Cardiovascular;  Laterality: N/A;  . lumbar back  surgery  2012  . [place] SURGERY  1991  . TONSILLECTOMY    . TONSILLECTOMY AND ADENOIDECTOMY  1960s    Family History  Problem Relation Age of Onset  . Stroke Mother   . Heart disease Mother   . Emphysema Father   . Ovarian cancer Sister   . Stroke Sister   . Other Child        died at birth    Allergies  Allergen Reactions  . Iodinated Diagnostic Agents Anaphylaxis and Other (See Comments)    IPD dye Info given by patient  . Iodine Anaphylaxis  . Ioxaglate Anaphylaxis and Other (See Comments)    Info given by patient  . Red Dye Anaphylaxis  . Lactose Intolerance (Gi)   . Whey Other (See Comments)    Lactose intolerance  . Hydralazine  Anxiety and Other (See Comments)    Facial flushing, pt prefers not to use it.   . Milk-Related Compounds Other (See Comments)    Lactose intolerance Can tolerate milk if its cooked into the recipe, just can't drink milk   . Propoxyphene Rash and Anxiety    Current Outpatient Medications on File Prior to Visit  Medication Sig Dispense Refill  . acetaminophen (TYLENOL) 500 MG tablet Take 500-1,000 mg by mouth daily as needed for mild pain or moderate pain.     [name] amLODipine (NORVASC) 5 MG tablet Take 1 tablet (5 mg total) by mouth daily. 90 tablet 3  . apixaban (ELIQUIS) 5 MG TABS tablet Take 1 tablet (5 mg total) by mouth 2 (two) times daily. 60 tablet 2  . atorvastatin (LIPITOR) 40 MG tablet Take 1 tablet (40 mg total) by mouth daily at 6 PM. 30 tablet 0  . carvedilol (COREG) 25 MG tablet Take 1 tablet (25 mg total) by mouth 2 (two) times daily with a meal. 180 tablet 1  . furosemide (LASIX) 40 MG tablet TAKE 1 TABLET BY MOUTH ONCE DAILY. 90 tablet 0  . meclizine (ANTIVERT) 25 MG tablet Take 1 tablet (25 mg total) by mouth as needed for dizziness or nausea. 90 tablet 1  . pantoprazole (PROTONIX) 40 MG tablet TAKE 1 TABLET BY MOUTH TWICE DAILY. 180 tablet 1  . Pediatric Multivitamins-Iron (FLINTSTONES PLUS IRON) chewable tablet Chew 2  tablets by mouth daily. 60 tablet 0  . temazepam (RESTORIL) 15 MG capsule TAKE (1) CAPSULE BY MOUTH AT BEDTIME. 30 capsule 2  . traMADol (ULTRAM) 50 MG tablet TAKE (1) TABLET BY MOUTH EVERY EIGHT HOURS. 90 tablet 0   Current Facility-Administered Medications on File Prior to Visit  Medication Dose Route Frequency Provider Last Rate Last Dose  . cyanocobalamin ((VITAMIN B-12)) injection 1,000 mcg  1,000 mcg Intramuscular Once Burchette, Alinda Sierras, MD        BP (!) 130/54   Temp 98 F (36.7 C)   Wt 105 lb (47.6 kg)   BMI 19.20 kg/m       Objective:   Physical Exam Vitals signs and nursing note reviewed.  Constitutional:      Appearance: Normal appearance. She is well-groomed and underweight.  HENT:     Right Ear: Tympanic membrane, ear canal and external ear normal. There is no impacted cerumen.     Left Ear: Tympanic membrane, ear canal and external ear normal. There is no impacted cerumen.     Nose: Nose normal. No congestion or rhinorrhea.     Mouth/Throat:     Mouth: Mucous membranes are moist.     Pharynx: Oropharynx is clear.  Cardiovascular:     Rate and Rhythm: Normal rate. Rhythm irregularly irregular.     Pulses: Normal pulses.     Heart sounds: Normal heart sounds. No murmur. No friction rub. No gallop.   Pulmonary:     Effort: Pulmonary effort is normal. No respiratory distress.     Breath sounds: Normal breath sounds. No stridor. No wheezing, rhonchi or rales.  Chest:     Chest wall: No tenderness.  Musculoskeletal: Normal range of motion.  Skin:    General: Skin is warm and dry.     Capillary Refill: Capillary refill takes less than 2 seconds.  Neurological:     General: No focal deficit present.     Mental Status: She is alert and oriented to person, place, and time.  Sensory: No sensory deficit.     Motor: Weakness present.     Coordination: Coordination normal.     Deep Tendon Reflexes: Reflexes normal.       Assessment & Plan:  1. PAF (paroxysmal  atrial fibrillation) ([place]) -He has an upcoming appointment with Dr. Debara Pickett.   - atorvastatin (LIPITOR) 40 MG tablet; Take 1 tablet (40 mg total) by mouth daily at 6 PM.  Dispense: 90 tablet; Refill: 3  2. Mixed hyperlipidemia -Continue with Lipitor.  She is okay with cutting this pill in half - atorvastatin (LIPITOR) 40 MG tablet; Take 1 tablet (40 mg total) by mouth daily at 6 PM.  Dispense: 90 tablet; Refill: 3  3. Essential hypertension, benign -Continue with current blood pressure medications.  No change today  4. Cerebrovascular accident (CVA) due to embolism of right middle cerebral artery ([name])  - atorvastatin (LIPITOR) 40 MG tablet; Take 1 tablet (40 mg total) by mouth daily at 6 PM.  Dispense: 90 tablet; Refill: 3  5. Fatigue, unspecified type -Likely due to beta-blocker.  Would like her to follow-up with cardiology as directed. -I would like to see how she does without speech therapy, Occupational Therapy, physical therapy.  She was advised to continue to work on exercises that she was given.  If she feels as though she is losing ground then we can refer her back.  Dorothyann Peng, NP

## 2019-05-23 ENCOUNTER — Other Ambulatory Visit: Payer: Self-pay | Admitting: *Deleted

## 2019-05-23 NOTE — Patient Outreach (Signed)
[facility]) Care Management  05/23/2019  MARLEANA ALMQUIST 1940-07-07 [id number]   Follow up call to  EMMI- strokePatient- d/c from [place] Day #13 Date:Thursday 03/27/19 1001 Red Alert Reason:Questions/problems with meds?Yes  Insurance:NextGen Medicareand united healthcare medicare  [facility] admissions x1ED visits x in the last 6 months  Last admission 03/06/19 to 03/12/19   Follow up call to  EMMI- strokePatient- d/c from [place] Day #13 Date:Thursday 03/27/19 1001 Red Alert Reason:Questions/problems with meds?Yes  Insurance:NextGen Medicareand united healthcare medicare  [facility] admissions x1ED visits x in the last 6 months  Last admission 03/06/19 to 03/12/19   2nd Outreach attempt to follow up on and further assessment of any home medical needs No answer. [facility] RN CM left HIPAA compliant voicemail message along with CM's contact info.   Plan: [facility] RN CM scheduled this [facility] engaged patient for another call attempt within 7-10 business days if no return call from her per workflow  Unsuccessful outreach letter sent   Joelene Millin L. Lavina Hamman, RN, BSN, [place] Coordinator Office number [phone] Mobile number [phone]  Main [facility] number [phone] Fax number [phone]

## 2019-05-26 ENCOUNTER — Other Ambulatory Visit: Payer: Self-pay

## 2019-05-26 ENCOUNTER — Other Ambulatory Visit: Payer: Self-pay | Admitting: *Deleted

## 2019-05-26 NOTE — Patient Outreach (Signed)
[place] [facility]) Care Management  05/26/2019  DARCEY DEER September 10, 1940 [id number]   Follow up call to  EMMI- strokePatient- d/c from [place] Day #13 Date:Thursday 03/27/19 1001 Red Alert Reason:Questions/problems with meds?Yes  Insurance:NextGen Medicareand united healthcare medicare  [facility] admissions x1ED visits x in the last 6 months  Last admission 03/06/19 to 03/12/19   Outreach successful to pt's home number  She is able to verify HIPAA  EMMI stroke month follow up call discussed with her  [facility] RN CM followed back up on her medication, Eliquis in which she voiced cost concern with in September 2020. She reports obtaining Eliquis without concerns for the month of October 2020 after assist from [place] staff She denies any medical or social concerns today She reports she is preparing part of her meal for Thanksgiving  [facility] RN CM reminded of her of the availability of [facility] services prn She voiced appreciation for follow up call to her   Plan [facility] RN CM will close case at this time as patient has been assessed and no needs identified/needs resolved.   Pt encouraged to return a call to [place] CM prn   [facility] RN CM discussed the outreach letter already sent on with [facility] brochure enclosed for review  Mose Colaizzi L. Lavina Hamman, RN, BSN, [place] Coordinator Office number [phone] Mobile number [phone]  Main [facility] number [phone] Fax number [phone]

## 2019-06-17 ENCOUNTER — Ambulatory Visit (INDEPENDENT_AMBULATORY_CARE_PROVIDER_SITE_OTHER): Payer: Medicare Other | Admitting: Internal Medicine

## 2019-06-17 ENCOUNTER — Other Ambulatory Visit: Payer: Self-pay

## 2019-06-17 ENCOUNTER — Encounter: Payer: Self-pay | Admitting: Internal Medicine

## 2019-06-17 VITALS — BP 133/58 | HR 58 | Temp 98.2°F | Ht 62.0 in | Wt 102.4 lb

## 2019-06-17 DIAGNOSIS — I350 Nonrheumatic aortic (valve) stenosis: Secondary | ICD-10-CM

## 2019-06-17 DIAGNOSIS — I2510 Atherosclerotic heart disease of native coronary artery without angina pectoris: Secondary | ICD-10-CM | POA: Diagnosis not present

## 2019-06-17 DIAGNOSIS — I4819 Other persistent atrial fibrillation: Secondary | ICD-10-CM | POA: Diagnosis not present

## 2019-06-17 DIAGNOSIS — R5383 Other fatigue: Secondary | ICD-10-CM | POA: Diagnosis not present

## 2019-06-17 NOTE — Progress Notes (Signed)
OFFICE NOTE  Chief Complaint:  Follow-up stroke  Primary Care Physician: Miranda Peng, NP  HPI:  [name] is a pleasant 78 year old female kindly referred to me be [name]. She reports a past medical history significant for atrial fibrillation and congestive heart failure, which occurred around the same time and probably 2012. She vaguely recalls seeing [name] at that time.  She was started on warfarin therapy and no attempts were made to get her back into rhythm as she was paroxysmal. She does has a long-standing history of hypertension and dyslipidemia. She apparently developed heart failure or however has never had reassessment of her ejection fraction. She subsequently was having health issues and decided to move with her son to live in [place]. She says at that time she had some problems with chest pain and ultimately underwent a stress test. This was associated with an anaphylactic type reaction and despite their reassurances she will "no longer have any more stress test". She says she has a severe allergy to IV contrast dye which causes throat swelling as well as milk which also causes the same symptoms. Apparently she also underwent cardiac catheterization however she says that only medical therapy was recommended. This was in [place], [place].  We are trying to request records from there as well. She subsequently moved with her son to [facility], however he lost his job and she recently moved back to [place]. Currently she denies any significant shortness of breath but does report some mild leg edema which is slightly worse and occasional pain in her left chest and left arm over the last several days. She's also had pain in the right chest. None of those symptoms are worse with exertion or relieved by rest or medication.   [name] returns today for followup. She again appears upset and quite nervous. Blood pressure recently has been significantly elevated. In fact  she went to the emergency room and was hospitalized for blood pressure control. Although it seems that her blood pressure more easily was controlled once her oral medicines were restarted. Subsequently she's been started on clonidine is now up to twice a day. Blood pressure today was still high at [id number] systolic. She reports some occasional chest discomfort, mostly at rest and reports not being active enough to know if she has symptoms with exertion. She also has pain all over body significant low back pain and is seeing a specialist about that in the next few weeks.  I saw [name] back in the office today. She reports some improvement in her chest discomfort and I think this is related to blood pressure. The recent switch in her blood pressure medicines indicate a marked improvement in her pressures in she is noted to be 130/66 today. She does get some slight shortness of breath with exertion and no regular chest discomfort. At this time there is not clear evidence to pursue a further workup although I have a low threshold for further cardiac workup if she should become more symptomatic. She is in sinus rhythm and seems to be maintaining that.  [name] returns today for follow-up. She's had a remarkable past 6 months. Unfortunately she was diagnosed with a gastric cancer which was considered very high risk to resect. She was referred to [facility] for an endoscopic resection which was successful with clean margins. Prior to that she underwent cardiac evaluation by [facility] cardiology including a nuclear stress test which was low risk and didn't involve  exercise. She reports that during the last minute of exercise she went into A. fib. She's had recurrent A. fib on and off and in fact was just hospitalized again for A. fib a few weeks ago which converted spontaneously back to sinus rhythm, prior to cardiology is evaluating her. She was then directed to follow-up with me. She has fortunately been on warfarin  with therapeutic INRs. Of note, I did review her cardiac catheterization which was performed at an outside hospital 2014 which did demonstrate an 80% mid RCA stenosis as well as a 70% OM lesion and some mild to moderate disease of other vessels. At the time no stent was recommended, which leads me to believe these may be smaller vessels. The fact that her most recent stress test was again negative suggest that they are not flow limiting even though they would seem significant. Also, today she is describing left hand pain. She says that there is exquisite tenderness the left hand and it's difficult for her to extend her finger. She often notes that her left middle finger gets stuck. On palpation I do not notice any significant arthritis of the joints or nodules on the tendons.  04/19/2016  [name] returns today for follow-up. She is concerned about shortness of breath which is been progressive. She reports recently she's had some loss of vocal power and noted some upper airway wheezing. We and related with her on the office today and her oxygen saturations were noted to be 97% at rest and that came down to 95% with ambulation. EKG shows sinus rhythm at 66. Blood pressure is mildly elevated today. She has a nonproductive cough but denies fever, chills or sweats. Weight is up to 141 from 130 pounds 6 months ago.  07/11/2015  [name] returns today for follow-up. She seems to be doing fairly well. She denies any worsening shortness of breath or chest pain. She seems to be suffering from some acid reflux and will likely be undergoing another EGD with [name]. She continues to have problems with voice changes which could be due to vocal cord inflammation related to her reflux.  09/21/2016  [name] is here today for follow-up. She had an INR check in the office. She is scheduled to have upcoming surgery for large hiatal hernia. Warm to [facility] by [name]. Risk factors for surgery including  aortic stenosis, however this is recently assessed and is moderate. She denies any chest pain or anginal symptoms.  11/02/2016  Mrs. Biffle returns for follow-up today. Recently she's been having uncontrolled hypertension. 5 blood pressures been over [id number] systolic. She's not had a history with difficult to control hypertension in the past. He's also had associated chest discomfort. The other day she had severe chest discomfort and she has had headaches. In 2013 she underwent cardia catheterization in [place] which showed some moderate coronary disease including what appears to be an 80% stenosis of the RCA however was not treated at the time. She also had proximal LAD disease which was mild to moderate. I'm concerned that she's had progression of her coronary artery disease as a possible cause of her uncontrolled hypertension. She reports the pain has occurred at rest but can occur with exertion. She also gets short of breath with exertion. She's felt more fatigue as well. The pain is described as sharp and nonradiating in the central chest. Unfortunate she's on warfarin for PAF and will need to hold that for 5 days prior to the  procedure to safely undergo cardiac catheterization. Although he recently cleared her for hernia repair, she never underwent the surgery as she was felt to be at higher risk. Given this new information I would not recommend hernia surgery in the near future until we have a better idea of her coronary arteries.  11/16/2016  Mrs. Wetherald returns for follow-up. She underwent heart catheterization by Dr. Ellyn Hack on 11/08/2016 which showed hyperdynamic LVEF greater than 65%, there was a moderate 60% ramus lesion and 60% RPDA lesion which was not thought to be significant. There was mild aortic valvular stenosis. Blood pressure was elevated. Medical therapy including increased blood pressure control was recommended. Since discharge she has done well. She denied any competitions from the right  femoral access site. She's had one or 2 brief episodes of chest discomfort. Unfortunately after I saw her in the office at the last visit she fell in the parking lot by the [employer] caf[employer]ia. Ultimately she fractured her right radial styloid. That is now casted and in a sling. She does not anticipate the need for surgery, but she would be considered acceptable risk for surgery. Blood pressure is improved however not yet at goal.  02/19/2017  Mrs. Mohammad is seen today in follow-up. She is doing well other than painful osteoarthritis. Her blood pressures been better controlled although she is in need of refills. She was found to have moderate aortic stenosis by last echo however was only noted to be mild by cardiac catheterization. She'll need a repeat echo in about 6 months.  09/10/2016  Mrs. Paladino returns today for follow-up.  She is under a lot of stress today.  Her sister is in the hospital and had a repeat heart surgery.  Mrs. Drakeford his blood pressure is much better controlled today 114/60.  Overall she feels well and denies chest pain or worsening shortness of breath.  As noted she did have moderate coronary artery disease and even some severe distal coronary disease managed medically by heart cath in 2014.  Recently she had a lipid profile last week showing total cholesterol 182, triglycerides 109, HDL 54, LDL 107.  She is on simvastatin 20 mg.  She does have moderate aortic stenosis by echo in 05/2016.  03/20/2018  Mrs. Semmler is seen today in follow-up.  She has multiple complaints today including significant chest and back pain, weakness, fatigue.  Work-up recently including hospitalization demonstrated some acute heart failure with LVEF 50 to 55%.  She has permanent A. fib at this point and is not likely that that is the main cause of her weakness.  She was diuresed and is also noted to have mild to moderate aortic stenosis with a mean gradient of 14 mmHg in August.  Again I do not think this is  likely the cause of her multiple somatic complaints.  She does have diffuse arthritis and neuropathy and is on medication for that.  She may very well need pain management.  12/25/2018  Ms. Alegre is seen today in follow-up.  She is done fairly well over the past 6 months.  Her echo last year showed stable LVEF 50 to 55% with mild to moderate aortic stenosis.  She still has complaints of generalized pain.  She denies any congestive heart failure symptoms.  She does note that she goes in and out of A. fib however is not worsened with her symptoms regarding that.  Her INRs have been therapeutic on warfarin.  She is overdue for lipid profile.  Blood pressure also has been poorly controlled at home in general [id number] systolic.  [id number]  Ms. Eigsti is seen today in follow-up.  Unfortunately she had a stroke in September.  She is now back in A. fib.  She was converted from warfarin to Eliquis.  Since then she has been significantly fatigued with no energy.  She still remains borderline underweight with a BMI of 18 at 102 pounds.  She was seen by Kerin Ransom, PA-C, who noted she was still in persistent A. fib but rate controlled.  He adjusted her medicines further.  Today her heart rhythm is irregular on exam.  Heart rate in the 50s.  I suspect this is persistent A. fib.  She has now been on anticoagulation for more than 3 weeks, actually more than 2 months, and we discussed the possibility of an elective cardioversion.  PMHx:  Past Medical History:  Diagnosis Date  . Anemia 2005   Generally microcytic, transfusions in [address], 2012, 02/2015, 05/2015  . Aortic stenosis    Moderate November 2017  . Arthritis   . Broken back 2013   Chronic back pain.   [name] CAD (coronary artery disease)    Cardiac catheterization 2014 - 80% mid RCA and 70% OM managed medically  . CHF (congestive heart failure) ([facility])   . Chronic bilateral pleural effusions   . Chronic headache   . Contrast media allergy   . Diastolic  heart failure ([place])   . Esophageal cancer ([place]) 05/06/2015   Adenocarcinoma GE junction  . Facial paresthesia   . GERD (gastroesophageal reflux disease)   . H. pylori infection   . H/O iron deficiency    05-06-15 iron infusion ([facility])  . HH (hiatus hernia) 2008   Large with associated erosions  . Hypertension   . Paroxysmal atrial fibrillation ([name])   . PONV (postoperative nausea and vomiting)   . Pulmonary emboli ([place]) 2008, 2012  . Stroke [facility]) 1982    Past Surgical History:  Procedure Laterality Date  . APPENDECTOMY  1950s  . BACK SURGERY    . CARPAL TUNNEL RELEASE Bilateral 1990s  . [place] SURGERY  2014  . CHOLECYSTECTOMY  1980s   open  . COLONOSCOPY N/A 02/17/2015   Procedure: COLONOSCOPY;  Surgeon: [name], MD;  Location: [facility] ENDOSCOPY;  Service: Endoscopy;  Laterality: N/A;  . ESOPHAGOGASTRODUODENOSCOPY N/A 02/16/2015   Procedure: ESOPHAGOGASTRODUODENOSCOPY (EGD);  Surgeon: [name], MD;  Location: [facility];  Service: Endoscopy;  Laterality: N/A;  . ESOPHAGOGASTRODUODENOSCOPY N/A 05/06/2015   Procedure: ESOPHAGOGASTRODUODENOSCOPY (EGD);  Surgeon: [name], MD;  Location: [facility] ENDOSCOPY;  Service: Endoscopy;  Laterality: N/A;  . EUS N/A 05/20/2015   Procedure: UPPER ENDOSCOPIC ULTRASOUND (EUS) LINEAR;  Surgeon: [name], MD;  Location: [facility] ENDOSCOPY;  Service: Endoscopy;  Laterality: N/A;  . exploratory lab  1950s or 1960s  . GIVENS CAPSULE STUDY N/A 05/06/2015   Procedure: GIVENS CAPSULE STUDY;  Surgeon: [name], MD;  Location: [facility] ENDOSCOPY;  Service: Endoscopy;  Laterality: N/A;  . KNEE ARTHROSCOPY WITH MEDIAL MENISECTOMY Left 04/18/2018   Procedure: LEFT KNEE ARTHROSCOPY WITH PARTIAL MEDIAL MENISECTOMY AND ANTERIOR CRUCIATE LIGAMENT DEBRIDEMENT;  Surgeon: [name], MD;  Location: [facility] ORS;  Service: Orthopedics;  Laterality: Left;  . LEFT HEART CATH AND CORONARY ANGIOGRAPHY N/A 11/08/2016   Procedure: Left Heart Cath and Coronary  Angiography;  Surgeon: [name], MD;  Location: [facility] CV LAB;  Service: Cardiovascular;  Laterality: N/A;  .  lumbar back surgery  2012  . [place] SURGERY  1991  . TONSILLECTOMY    . TONSILLECTOMY AND ADENOIDECTOMY  1960s    FAMHx:  Family History  Problem Relation Age of Onset  . Stroke Mother   . Heart disease Mother   . Emphysema Father   . Ovarian cancer Sister   . Stroke Sister   . Other Child        died at birth    SOCHx:   reports that she has never smoked. She has never used smokeless tobacco. She reports that she does not drink alcohol or use drugs.  ALLERGIES:  Allergies  Allergen Reactions  . Iodinated Diagnostic Agents Anaphylaxis and Other (See Comments)    IPD dye Info given by patient  . Iodine Anaphylaxis  . Ioxaglate Anaphylaxis and Other (See Comments)    Info given by patient  . Red Dye Anaphylaxis  . Lactose Intolerance (Gi)   . Whey Other (See Comments)    Lactose intolerance  . Hydralazine Anxiety and Other (See Comments)    Facial flushing, pt prefers not to use it.   . Milk-Related Compounds Other (See Comments)    Lactose intolerance Can tolerate milk if its cooked into the recipe, just can't drink milk   . Propoxyphene Rash and Anxiety    ROS: Pertinent items noted in HPI and remainder of comprehensive ROS otherwise negative.  HOME MEDS: Current Outpatient Medications  Medication Sig Dispense Refill  . acetaminophen (TYLENOL) 500 MG tablet Take 500-1,000 mg by mouth daily as needed for mild pain or moderate pain.     [name] amLODipine (NORVASC) 5 MG tablet Take 1 tablet (5 mg total) by mouth daily. 90 tablet 3  . apixaban (ELIQUIS) 5 MG TABS tablet Take 1 tablet (5 mg total) by mouth 2 (two) times daily. 60 tablet 2  . atorvastatin (LIPITOR) 40 MG tablet Take 1 tablet (40 mg total) by mouth daily at 6 PM. 90 tablet 3  . carvedilol (COREG) 25 MG tablet Take 1 tablet (25 mg total) by mouth 2 (two) times daily with a meal. 180  tablet 1  . diltiazem (TIAZAC) 120 MG 24 hr capsule Take by mouth.    . furosemide (LASIX) 40 MG tablet TAKE 1 TABLET BY MOUTH ONCE DAILY. 90 tablet 0  . meclizine (ANTIVERT) 25 MG tablet Take 1 tablet (25 mg total) by mouth as needed for dizziness or nausea. 90 tablet 1  . pantoprazole (PROTONIX) 40 MG tablet TAKE 1 TABLET BY MOUTH TWICE DAILY. 180 tablet 1  . Pediatric Multivitamins-Iron (FLINTSTONES PLUS IRON) chewable tablet Chew 2 tablets by mouth daily. 60 tablet 0  . temazepam (RESTORIL) 15 MG capsule TAKE (1) CAPSULE BY MOUTH AT BEDTIME. 30 capsule 2  . traMADol (ULTRAM) 50 MG tablet TAKE (1) TABLET BY MOUTH EVERY EIGHT HOURS. 90 tablet 0   Current Facility-Administered Medications  Medication Dose Route Frequency Provider Last Rate Last Admin  . cyanocobalamin ((VITAMIN B-12)) injection 1,000 mcg  1,000 mcg Intramuscular Once Eulas Post, MD        LABS/IMAGING: No results found for this or any previous visit (from the past 48 hour(s)). No results found.  VITALS: BP (!) 133/58   Pulse (!) 58   Temp 98.2 F (36.8 C)   Ht 5\' 2"  (1.575 m)   Wt 102 lb 6.4 oz (46.4 kg)   SpO2 97%   BMI 18.73 kg/m   EXAM: General appearance: alert and no distress  Neck: no carotid bruit and no JVD Lungs: clear to auscultation bilaterally Heart: regular rate and rhythm, S1, S2 normal and systolic murmur: midsystolic 3/6, crescendo at 2nd right intercostal space Abdomen: soft, non-tender; bowel sounds normal; no masses,  no organomegaly Extremities: normal Pulses: 2+ and symmetric Skin: Skin color, texture, turgor normal. No rashes or lesions Neurologic: Grossly normal Psych: Pleasant  EKG: For  ASSESSMENT: 1. Recent stroke (affecting bulbar symptoms) 2. History of unstable angina - cath showed two-vessel moderate CAD and mild aortic stenosis (10/2016) 3. Uncontrolled hypertension 4. Paroxysmal atrial fibrillation -on Eliquis 5. History of diastolic congestive heart failure - EF  50-55% on echo 6. Dyslipidemia 7. Moderate aortic stenosis 8. Right hand pain with contracture 9. Recent GI cancer/resected  PLAN: 1.   Mrs. Limbacher unfortunately had a recent stroke in September.  She has been switched from warfarin to Eliquis.  A repeat echo showed EF greater than 65% with moderate aortic stenosis.  She is now in A. fib and I suspect a combination of A. fib and aortic stenosis is contributing to her fatigue.  Since she has been anticoagulated for more than 3 weeks it is reasonable to consider a cardioversion to see if it improves her symptoms.  She wants to think about it and get back to me.  If she decides to go forward with it, we will schedule it for the first available cardiologist unless she prefers me.  An EKG can be done on the day of the procedure to determine if she is in persistent A. fib which I suspect is the case.  Plan follow-up with me afterwards.  Pixie Casino, MD, [facility], [place] Director of the Advanced Lipid Disorders &  Cardiovascular Risk Reduction Clinic Diplomate of the American Board of Clinical Lipidology Attending Cardiologist  Direct Dial: [phone]  Fax: [phone]  Website:  [email or web address] 06/17/2019, 2:43 PM

## 2019-06-17 NOTE — Patient Instructions (Signed)
Medication Instructions:  Your physician recommends that you continue on your current medications as directed. Please refer to the Current Medication list given to you today.  *If you need a refill on your cardiac medications before your next appointment, please call your pharmacy*   Follow-Up: At [facility], you and your health needs are our priority.  As part of our continuing mission to provide you with exceptional heart care, we have created designated Provider Care Teams.  These Care Teams include your primary Cardiologist (physician) and Advanced Practice Providers (APPs -  Physician Assistants and Nurse Practitioners) who all work together to provide you with the care you need, when you need it.  Your next appointment:   6 months  The format for your next appointment:   Either In Person or Virtual  Provider:   You may see Kenneth C Hilty, MD or one of the following Advanced Practice Providers on your designated Care Team:    Hao Meng, PA-C  Angela Duke, PA-C or   Krista Kroeger, PA-C   Other Instructions   

## 2019-06-23 ENCOUNTER — Telehealth: Payer: Self-pay | Admitting: Internal Medicine

## 2019-06-23 DIAGNOSIS — Z01812 Encounter for preprocedural laboratory examination: Secondary | ICD-10-CM

## 2019-06-23 DIAGNOSIS — I4819 Other persistent atrial fibrillation: Secondary | ICD-10-CM

## 2019-06-23 NOTE — Telephone Encounter (Signed)
New message   Patient states that she wants to discuss a procedure that has been discussed prior with Dr. Debara Pickett. Please advise.

## 2019-06-23 NOTE — Telephone Encounter (Signed)
Patient called w/cardioversion instructions. She is aware this is also in her MyChart portal

## 2019-06-23 NOTE — Telephone Encounter (Signed)
Spoke with patient who would like to arrange cardioversion. She would like to do this with Dr. Debara Pickett on Tues Jan 5 and have [facility] screening on Saturday Jan 2 at [facility]. She is aware of quarantine required. She uses MyChart - will send instructions there

## 2019-06-23 NOTE — Telephone Encounter (Signed)
[name] in scheduling [facility] on 07/08/2019 @ 1030am Arrive 930am Booking # [id number]  Patient notified via [place]

## 2019-07-01 ENCOUNTER — Other Ambulatory Visit: Payer: Self-pay | Admitting: Internal Medicine

## 2019-07-01 DIAGNOSIS — I4819 Other persistent atrial fibrillation: Secondary | ICD-10-CM

## 2019-07-01 LAB — CBC
Hematocrit: 36.5 % (ref 34.0–46.6)
Hemoglobin: 12.1 g/dL (ref 11.1–15.9)
MCH: 28.9 pg (ref 26.6–33.0)
MCHC: 33.2 g/dL (ref 31.5–35.7)
MCV: 87 fL (ref 79–97)
Platelets: 185 10*3/uL (ref 150–450)
RBC: 4.18 x10E6/uL (ref 3.77–5.28)
RDW: 12.7 % (ref 11.7–15.4)
WBC: 5.7 10*3/uL (ref 3.4–10.8)

## 2019-07-01 LAB — BASIC METABOLIC PANEL
BUN/Creatinine Ratio: 17 (ref 12–28)
BUN: 14 mg/dL (ref 8–27)
CO2: 26 mmol/L (ref 20–29)
Calcium: 8.9 mg/dL (ref 8.7–10.3)
Chloride: 99 mmol/L (ref 96–106)
Creatinine, Ser: 0.82 mg/dL (ref 0.57–1.00)
GFR calc Af Amer: 79 mL/min/{1.73_m2} (ref >59)
GFR calc non Af Amer: 68 mL/min/{1.73_m2} (ref >59)
Glucose: 178 mg/dL — ABNORMAL HIGH (ref 65–99)
Potassium: 3.4 mmol/L — ABNORMAL LOW (ref 3.5–5.2)
Sodium: 143 mmol/L (ref 134–144)

## 2019-07-05 ENCOUNTER — Other Ambulatory Visit (HOSPITAL_COMMUNITY)
Admission: RE | Admit: 2019-07-05 | Discharge: 2019-07-05 | Disposition: A | Discharge status: Home / Self Care | Payer: Medicare Other | Source: Ambulatory Visit | Attending: Internal Medicine | Admitting: Internal Medicine

## 2019-07-05 DIAGNOSIS — Z01812 Encounter for preprocedural laboratory examination: Secondary | ICD-10-CM | POA: Insufficient documentation

## 2019-07-05 DIAGNOSIS — Z20822 Contact with and (suspected) exposure to covid-19: Secondary | ICD-10-CM | POA: Insufficient documentation

## 2019-07-05 LAB — SARS CORONAVIRUS 2 (TAT 6-24 HRS): SARS Coronavirus 2: NEGATIVE

## 2019-07-08 ENCOUNTER — Ambulatory Visit (HOSPITAL_COMMUNITY): Payer: Medicare Other | Admitting: Anesthesiology

## 2019-07-08 ENCOUNTER — Ambulatory Visit (HOSPITAL_COMMUNITY)
Admission: RE | Admit: 2019-07-08 | Discharge: 2019-07-08 | Disposition: A | Discharge status: Home / Self Care | Payer: Medicare Other | Attending: Internal Medicine | Admitting: Internal Medicine

## 2019-07-08 ENCOUNTER — Encounter (HOSPITAL_COMMUNITY)
Admission: RE | Disposition: A | Discharge status: Home / Self Care | Payer: Self-pay | Source: Home / Self Care | Attending: Internal Medicine

## 2019-07-08 ENCOUNTER — Other Ambulatory Visit: Payer: Self-pay

## 2019-07-08 DIAGNOSIS — I5032 Chronic diastolic (congestive) heart failure: Secondary | ICD-10-CM | POA: Diagnosis not present

## 2019-07-08 DIAGNOSIS — I25110 Atherosclerotic heart disease of native coronary artery with unstable angina pectoris: Secondary | ICD-10-CM | POA: Diagnosis not present

## 2019-07-08 DIAGNOSIS — Z79899 Other long term (current) drug therapy: Secondary | ICD-10-CM | POA: Insufficient documentation

## 2019-07-08 DIAGNOSIS — Z8673 Personal history of transient ischemic attack (TIA), and cerebral infarction without residual deficits: Secondary | ICD-10-CM | POA: Diagnosis not present

## 2019-07-08 DIAGNOSIS — K219 Gastro-esophageal reflux disease without esophagitis: Secondary | ICD-10-CM | POA: Diagnosis not present

## 2019-07-08 DIAGNOSIS — I480 Paroxysmal atrial fibrillation: Secondary | ICD-10-CM | POA: Diagnosis not present

## 2019-07-08 DIAGNOSIS — I350 Nonrheumatic aortic (valve) stenosis: Secondary | ICD-10-CM | POA: Insufficient documentation

## 2019-07-08 DIAGNOSIS — I4819 Other persistent atrial fibrillation: Secondary | ICD-10-CM

## 2019-07-08 DIAGNOSIS — I110 Hypertensive heart disease with heart failure: Secondary | ICD-10-CM | POA: Diagnosis not present

## 2019-07-08 DIAGNOSIS — E785 Hyperlipidemia, unspecified: Secondary | ICD-10-CM | POA: Insufficient documentation

## 2019-07-08 HISTORY — PX: CARDIOVERSION: SHX1299

## 2019-07-08 LAB — POCT I-STAT, CHEM 8
BUN: 15 mg/dL (ref 8–23)
Calcium, Ion: 1.13 mmol/L — ABNORMAL LOW (ref 1.15–1.40)
Chloride: 98 mmol/L (ref 98–111)
Creatinine, Ser: 0.9 mg/dL (ref 0.44–1.00)
Glucose, Bld: 150 mg/dL — ABNORMAL HIGH (ref 70–99)
HCT: 37 % (ref 36.0–46.0)
Hemoglobin: 12.6 g/dL (ref 12.0–15.0)
Potassium: 3.4 mmol/L — ABNORMAL LOW (ref 3.5–5.1)
Sodium: 140 mmol/L (ref 135–145)
TCO2: 32 mmol/L (ref 22–32)

## 2019-07-08 SURGERY — CARDIOVERSION
Anesthesia: General

## 2019-07-08 MED ORDER — PROPOFOL 10 MG/ML IV BOLUS
INTRAVENOUS | Status: DC | PRN
  Administered 2019-07-08: 40 mg via INTRAVENOUS

## 2019-07-08 MED ORDER — SODIUM CHLORIDE 0.9 % IV SOLN: INTRAVENOUS | Status: DC

## 2019-07-08 MED ORDER — LIDOCAINE 2% (20 MG/ML) 5 ML SYRINGE
INTRAMUSCULAR | Status: DC | PRN
  Administered 2019-07-08: 40 mg via INTRAVENOUS

## 2019-07-08 MED ORDER — SODIUM CHLORIDE 0.9 % IV SOLN
INTRAVENOUS | Status: AC | PRN
  Administered 2019-07-08: 500 mL via INTRAMUSCULAR

## 2019-07-08 NOTE — Anesthesia Postprocedure Evaluation (Signed)
Anesthesia Post Note  Patient: [name]  Procedure(s) Performed: CARDIOVERSION (N/A )     Patient location during evaluation: Endoscopy Anesthesia Type: General Level of consciousness: awake and alert Pain management: pain level controlled Vital Signs Assessment: post-procedure vital signs reviewed and stable Respiratory status: spontaneous breathing, nonlabored ventilation, respiratory function stable and patient connected to nasal cannula oxygen Cardiovascular status: blood pressure returned to baseline and stable Postop Assessment: no apparent nausea or vomiting Anesthetic complications: no    Last Vitals:  Vitals:   07/08/19 1011  BP: 140/77  Pulse: 80  Resp: 18  Temp: 36.7 C  SpO2: 98%    Last Pain:  Vitals:   07/08/19 1011  TempSrc: Oral  PainSc: 0-No pain                 Barnet Glasgow

## 2019-07-08 NOTE — H&P (Signed)
   INTERVAL PROCEDURE H&P  History and Physical Interval Note:  07/08/2019 9:54 AM  [name] has presented today for their planned procedure. The various methods of treatment have been discussed with the patient and family. After consideration of risks, benefits and other options for treatment, the patient has consented to the procedure.  The patients' outpatient history has been reviewed, patient examined, and no change in status from most recent office note within the past 30 days. I have reviewed the patients' chart and labs and will proceed as planned. Questions were answered to the patient's satisfaction.   Pixie Casino, MD, [facility], [place] Director of the Advanced Lipid Disorders &  Cardiovascular Risk Reduction Clinic Diplomate of the American Board of Clinical Lipidology Attending Cardiologist  Direct Dial: [phone]  Fax: [phone]  Website:  [email or web address] 07/08/2019, 9:54 AM

## 2019-07-08 NOTE — Transfer of Care (Signed)
Immediate Anesthesia Transfer of Care Note  Patient: [name]  Procedure(s) Performed: CARDIOVERSION (N/A )  Patient Location: Endoscopy Unit  Anesthesia Type:General  Level of Consciousness: awake and alert   Airway & Oxygen Therapy: Patient Spontanous Breathing  Post-op Assessment: Report given to RN and Post -op Vital signs reviewed and stable  Post vital signs: Reviewed and stable  Last Vitals:  Vitals Value Taken Time  BP    Temp    Pulse    Resp    SpO2      Last Pain:  Vitals:   07/08/19 1011  TempSrc: Oral  PainSc: 0-No pain         Complications: No apparent anesthesia complications

## 2019-07-08 NOTE — CV Procedure (Signed)
   CARDIOVERSION NOTE  Procedure: Electrical Cardioversion Indications:  Atrial Fibrillation  Procedure Details:  Consent: Risks of procedure as well as the alternatives and risks of each were explained to the (patient/caregiver).  Consent for procedure obtained.  Time Out: Verified patient identification, verified procedure, site/side was marked, verified correct patient position, special equipment/implants available, medications/allergies/relevent history reviewed, required imaging and test results available.  Performed  Patient placed on cardiac monitor, pulse oximetry, supplemental oxygen as necessary.  Sedation given: propofol per anesthesia Pacer pads placed anterior and posterior chest.  Cardioverted 2 time(s).  Cardioverted at 150J and 200J biphasic.  Impression: Findings: Post procedure EKG shows: NSR Complications: None Patient did tolerate procedure well.  Plan: 1. Successful DCCV to NSR with 2 stacked shocks.  Time Spent Directly with the Patient:  30 minutes   Pixie Casino, MD, [facility], [place] Director of the [facility]  [facility] Diplomate of the American Board of Clinical Lipidology Attending Cardiologist  Direct Dial: [phone]  Fax: [phone]  Website:  [email or web address] 07/08/2019, 10:39 AM

## 2019-07-08 NOTE — Anesthesia Procedure Notes (Signed)
Procedure Name: General with mask airway Performed by: Valda Favia, CRNA Pre-anesthesia Checklist: Patient identified, Emergency Drugs available, Suction available, Patient being monitored and Timeout performed Patient Re-evaluated:Patient Re-evaluated prior to induction Oxygen Delivery Method: Ambu bag Preoxygenation: Pre-oxygenation with 100% oxygen Induction Type: IV induction Placement Confirmation: positive ETCO2 Dental Injury: Teeth and Oropharynx as per pre-operative assessment

## 2019-07-08 NOTE — Anesthesia Preprocedure Evaluation (Addendum)
Anesthesia Evaluation  Patient identified by MRN, date of birth, ID band  Reviewed: Allergy & Precautions, NPO status , Patient's Chart, lab work & pertinent test results  Airway Mallampati: II  TM Distance: >3 FB Neck ROM: Full    Dental  (+) Upper Dentures, Poor Dentition   Pulmonary neg pulmonary ROS,    Pulmonary exam normal breath sounds clear to auscultation       Cardiovascular hypertension, Pt. on medications + CAD and +CHF  + dysrhythmias Atrial Fibrillation  Rhythm:Irregular Rate:Abnormal  03/07/19 echo  EF 65%   Neuro/Psych Pt w dysphagia CVA    GI/Hepatic GERD  ,  Endo/Other    Renal/GU K+ 3.4 Cr 0.82     Musculoskeletal   Abdominal   Peds  Hematology Hgb 12.1   Anesthesia Other Findings   Reproductive/Obstetrics                            Anesthesia Physical Anesthesia Plan  ASA: III  Anesthesia Plan: General   Post-op Pain Management:    Induction:   PONV Risk Score and Plan:   Airway Management Planned: Nasal Cannula and Natural Airway  Additional Equipment: None  Intra-op Plan:   Post-operative Plan:   Informed Consent: I have reviewed the patients History and Physical, chart, labs and discussed the procedure including the risks, benefits and alternatives for the proposed anesthesia with the patient or authorized representative who has indicated his/her understanding and acceptance.     Dental advisory given  Plan Discussed with: CRNA  Anesthesia Plan Comments:         Anesthesia Quick Evaluation

## 2019-07-23 ENCOUNTER — Other Ambulatory Visit: Payer: Self-pay | Admitting: Family Medicine

## 2019-07-23 DIAGNOSIS — Z760 Encounter for issue of repeat prescription: Secondary | ICD-10-CM

## 2019-07-23 NOTE — Telephone Encounter (Signed)
I see that Dr. Hosie Poisson, MD has been filling this medication. OK to send to this doctor or are you going to start to fill this medication?

## 2019-07-23 NOTE — Telephone Encounter (Signed)
It looks that that was the hospitalist who prescribed it and gave her refills. We can take over management. Ok to send in for one year

## 2019-07-28 ENCOUNTER — Encounter: Payer: Self-pay | Admitting: Adult Health

## 2019-07-28 ENCOUNTER — Ambulatory Visit (INDEPENDENT_AMBULATORY_CARE_PROVIDER_SITE_OTHER): Payer: Medicare Other | Admitting: Adult Health

## 2019-07-28 ENCOUNTER — Telehealth: Payer: Self-pay | Admitting: Internal Medicine

## 2019-07-28 ENCOUNTER — Other Ambulatory Visit: Payer: Self-pay

## 2019-07-28 VITALS — BP 136/72 | HR 73 | Temp 97.6°F | Ht 62.0 in | Wt 100.0 lb

## 2019-07-28 DIAGNOSIS — R471 Dysarthria and anarthria: Secondary | ICD-10-CM

## 2019-07-28 DIAGNOSIS — I480 Paroxysmal atrial fibrillation: Secondary | ICD-10-CM

## 2019-07-28 DIAGNOSIS — E782 Mixed hyperlipidemia: Secondary | ICD-10-CM | POA: Diagnosis not present

## 2019-07-28 DIAGNOSIS — I63411 Cerebral infarction due to embolism of right middle cerebral artery: Secondary | ICD-10-CM | POA: Diagnosis not present

## 2019-07-28 DIAGNOSIS — F329 Major depressive disorder, single episode, unspecified: Secondary | ICD-10-CM

## 2019-07-28 DIAGNOSIS — I10 Essential (primary) hypertension: Secondary | ICD-10-CM

## 2019-07-28 MED ORDER — MIRTAZAPINE 7.5 MG PO TABS: 7.5000 mg | ORAL_TABLET | Freq: Every day | ORAL | 3 refills | Status: DC

## 2019-07-28 NOTE — Telephone Encounter (Signed)
Left a message for the patient to call back.  

## 2019-07-28 NOTE — Patient Instructions (Addendum)
Please return to Dr. Lysbeth Penner office today regarding symptoms on Saturday night as he may want you to be seen for further evaluation -if the symptoms recur, please do not hesitate to call [phone]  Continue Eliquis (apixaban) daily  and Lipitor for secondary stroke prevention  Continue to follow up with PCP regarding cholesterol and blood pressure management  Recommend reaching out to your prior orthopedic or neurosurgery provider who did your surgical procedure in 2014 due to slowly worsening neck pain with headache  Continue to monitor blood pressure at home  Maintain strict control of hypertension with blood pressure goal below 130/90, diabetes with hemoglobin A1c goal below 6.5% and cholesterol with LDL cholesterol (bad cholesterol) goal below 70 mg/dL. I also advised the patient to eat a healthy diet with plenty of whole grains, cereals, fruits and vegetables, exercise regularly and maintain ideal body weight.  Followup in the future with me in 4 months or call earlier if needed       Thank you for coming to see [place] at [facility]. I hope we have been able to provide you high quality care today.  You may receive a patient satisfaction survey over the next few weeks. We would appreciate your feedback and comments so that we may continue to improve ourselves and the health of our patients.

## 2019-07-28 NOTE — Progress Notes (Signed)
[facility] [address] [place]. [place] [address] [phone]       STROKE FOLLOW UP NOTE  Ms. [name] Date of Birth:  02-29-1941 Medical Record Number:  [id number]   Reason for Referral: stroke follow up    CHIEF COMPLAINT:  Chief Complaint  Patient presents with  . Follow-up    TxRm.  With niece. states that she has had numbness on the right side of her head. States that she had the cardio procedure.    HPI: Stroke admission 03/06/2019: [name] is a 79 y.o. female with history of Afib on home coumadin, CAD, CHF, PE, prior CVA, aortic stenosis, HTN who was transferred from [facility] after presenting with sudden onset slurred speech and left facial droop.  Stroke work-up revealed acute ischemic right MCA territory branch infarct as evidenced on MRI secondary to acute right M2 branch occlusion as evidenced on MRA secondary to atrial fibrillation.  He did not receive TPA due to anticoagulation.  2D echo unremarkable.  LDL 107.  A1c 6.2.  Recommended restarting DOAC once able to swallow medications.  HTN stable.  Resume Vytorin for HLD management.  Other stroke risk factors include advanced age, history of stroke, history of PE CAD and CHF.  Prior to discharge, patient passed swallow evaluation and was started on dysphagia 1 diet.  Eliquis initiated for history of atrial fibrillation and secondary stroke prevention.  She was discharged home in stable condition with recommendation home health therapies.   Initial visit 04/24/2019: [name] is being seen today for hospital follow-up accompanied by her niece.  Residual deficits of dyarthria and mild to moderate oropharyngeal dysphagia.  She continues to participate in home health speech therapy and recently underwent barium swallow with recommended dysphagia 3 thin liquid diet. She has been doing well with diet advancement with only occasional difficulty.  She also endorses mild cognitive impairment such  as not being able to do certain activities as well such as cooking and crocheting.  Currently residing with her son.  Remains on Eliquis without recent bleeding or bruising.  Aspirin discontinued due to episode of epistaxis which has since resolved.  She has not had follow-up with cardiology since hospital discharge.  Blood pressure today satisfactory at 131/65.  Denies new or worsening stroke/TIA symptoms.  Update 07/28/2019: [name] is a 79 year old female who is being seen today for stroke follow-up accompanied by her niece.  Residual stroke deficits of dysarthria and does report resolution of prior dysphagia.  She has been able to maintain a regular diet without difficulty but does report poor appetite.  She has lost approximately 5 pounds since prior visit with today's weight 100 pounds.  She continues to reside with her son and has been able to slowly increase ADLs independently.  Continues on Eliquis for secondary stroke prevention and history of atrial fibrillation without bleeding or bruising.  Recently underwent cardioversion with Dr. Debara Pickett for persistent atrial fibrillation on [id number] without complication.  She believes she has returned back to atrial fibrillation recently and does report having an episode on 07/25/2018 at night while lying in bed consisting of increased chest pressure which transition to numbness sensation radiating across her right torso, left arm pain, neck pain, nausea, and diaphoresis.  Symptoms lasted approximately 1 hour and then resolved.  She denies any weakness, new onset numbness/tingling in extremities, visual impairment or worsening speech.  She did not seek medical evaluation as her son was sleeping and had a long  day at work and she did not want to disturb him.  She also has not reached out to cardiology to inform them of recent episode.  She does report having episodes of right-sided head numbness and pressure radiating into cervical area which has been present since  her stroke. She denies any worsening of symptoms. She does have history of cervical disc procedure in 2014.  Underlying history of anxiety/depression and increased stressors.  Blood pressure today 136/72.  Denies new or worsening stroke/TIA symptoms.     ROS:   14 system review of systems performed and negative with exception of speech difficulty, decreased appetite, depression, anxiety, headache and neck pain  PMH:  Past Medical History:  Diagnosis Date  . Anemia 2005   Generally microcytic, transfusions in [address], 2012, 02/2015, 05/2015  . Aortic stenosis    Moderate November 2017  . Arthritis   . Broken back 2013   Chronic back pain.   [name] CAD (coronary artery disease)    Cardiac catheterization 2014 - 80% mid RCA and 70% OM managed medically  . CHF (congestive heart failure) ([place])   . Chronic bilateral pleural effusions   . Chronic headache   . Contrast media allergy   . Diastolic heart failure ([place])   . Esophageal cancer ([place]) 05/06/2015   Adenocarcinoma GE junction  . Facial paresthesia   . GERD (gastroesophageal reflux disease)   . H. pylori infection   . H/O iron deficiency    05-06-15 iron infusion ([facility])  . HH (hiatus hernia) 2008   Large with associated erosions  . Hypertension   . Paroxysmal atrial fibrillation ([name])   . PONV (postoperative nausea and vomiting)   . Pulmonary emboli ([place]) 2008, 2012  . Stroke [facility]) 1982    PSH:  Past Surgical History:  Procedure Laterality Date  . APPENDECTOMY  1950s  . BACK SURGERY    . CARDIOVERSION N/A 07/08/2019   Procedure: CARDIOVERSION;  Surgeon: Pixie Casino, MD;  Location: [place];  Service: Cardiovascular;  Laterality: N/A;  . CARPAL TUNNEL RELEASE Bilateral 1990s  . [place] SURGERY  2014  . CHOLECYSTECTOMY  1980s   open  . COLONOSCOPY N/A 02/17/2015   Procedure: COLONOSCOPY;  Surgeon: [name], MD;  Location: [facility] ENDOSCOPY;  Service: Endoscopy;  Laterality: N/A;  . ESOPHAGOGASTRODUODENOSCOPY N/A  02/16/2015   Procedure: ESOPHAGOGASTRODUODENOSCOPY (EGD);  Surgeon: [name], MD;  Location: [facility];  Service: Endoscopy;  Laterality: N/A;  . ESOPHAGOGASTRODUODENOSCOPY N/A 05/06/2015   Procedure: ESOPHAGOGASTRODUODENOSCOPY (EGD);  Surgeon: [name], MD;  Location: [facility] ENDOSCOPY;  Service: Endoscopy;  Laterality: N/A;  . EUS N/A 05/20/2015   Procedure: UPPER ENDOSCOPIC ULTRASOUND (EUS) LINEAR;  Surgeon: [name], MD;  Location: [facility] ENDOSCOPY;  Service: Endoscopy;  Laterality: N/A;  . exploratory lab  1950s or 1960s  . GIVENS CAPSULE STUDY N/A 05/06/2015   Procedure: GIVENS CAPSULE STUDY;  Surgeon: [name], MD;  Location: [facility] ENDOSCOPY;  Service: Endoscopy;  Laterality: N/A;  . KNEE ARTHROSCOPY WITH MEDIAL MENISECTOMY Left 04/18/2018   Procedure: LEFT KNEE ARTHROSCOPY WITH PARTIAL MEDIAL MENISECTOMY AND ANTERIOR CRUCIATE LIGAMENT DEBRIDEMENT;  Surgeon: [name], MD;  Location: [facility];  Service: Orthopedics;  Laterality: Left;  . LEFT HEART CATH AND CORONARY ANGIOGRAPHY N/A 11/08/2016   Procedure: Left Heart Cath and Coronary Angiography;  Surgeon: [name], MD;  Location: [facility] CV LAB;  Service: Cardiovascular;  Laterality: N/A;  . lumbar back surgery  2012  .  [place] SURGERY  1991  . TONSILLECTOMY    . TONSILLECTOMY AND ADENOIDECTOMY  1960s    Social History:  Social History   Socioeconomic History  . Marital status: Widowed    Spouse name: Not on file  . Number of children: 3  . Years of education: 52  . Highest education level: Not on file  Occupational History  . Occupation: Retired  Tobacco Use  . Smoking status: Never Smoker  . Smokeless tobacco: Never Used  Substance and Sexual Activity  . Alcohol use: No    Alcohol/week: 0.0 standard drinks  . Drug use: No  . Sexual activity: Not Currently  Other Topics Concern  . Not on file  Social History Narrative   Born and raised in [place], [place]. Currently resides in a house  with her son. 1 dog. Fun: go to church   Divorced(Has total of #3 children)-[place], [place], [place]   Denies religious beliefs that would effect health care.    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[employer] Strain: Medium Risk  . Difficulty of Paying Living Expenses: Somewhat hard  Food Insecurity: No Food Insecurity  . Worried About Charity fundraiser in the Last Year: Never true  . Ran Out of Food in the Last Year: Never true  Transportation Needs: No Transportation Needs  . Lack of Transportation (Medical): No  . Lack of Transportation (Non-Medical): No  Physical Activity: Inactive  . Days of Exercise per Week: 0 days  . Minutes of Exercise per Session: 0 min  Stress: No Stress Concern Present  . Feeling of Stress : Not at all  Social Connections: Unknown  . Frequency of Communication with Friends and Family: Not on file  . Frequency of Social Gatherings with Friends and Family: Not on file  . Attends Religious Services: Not on file  . Active Member of Clubs or Organizations: Not on file  . Attends Archivist Meetings: Not on file  . Marital Status: Widowed  Intimate Partner Violence:   . Fear of Current or Ex-Partner: Not on file  . Emotionally Abused: Not on file  . Physically Abused: Not on file  . Sexually Abused: Not on file    Family History:  Family History  Problem Relation Age of Onset  . Stroke Mother   . Heart disease Mother   . Emphysema Father   . Ovarian cancer Sister   . Stroke Sister   . Other Child        died at birth    Medications:   Current Outpatient Medications on File Prior to Visit  Medication Sig Dispense Refill  . acetaminophen (TYLENOL) 500 MG tablet Take 500-1,000 mg by mouth daily as needed for mild pain or moderate pain.     [name] amLODipine (NORVASC) 5 MG tablet Take 1 tablet (5 mg total) by mouth daily.  90 tablet 3  . atorvastatin (LIPITOR) 40 MG tablet Take 1 tablet (40 mg total) by mouth daily at 6 PM. (Patient taking differently: Take 40 mg by mouth daily. ) 90 tablet 3  . ELIQUIS 5 MG TABS tablet TAKE (1) TABLET BY MOUTH TWICE DAILY. 180 tablet 3  . furosemide (LASIX) 40 MG tablet TAKE 1 TABLET BY MOUTH ONCE DAILY. (Patient taking differently: Take 40 mg by mouth daily. ) 90 tablet 0  . meclizine (ANTIVERT) 25 MG tablet Take 1  tablet (25 mg total) by mouth as needed for dizziness or nausea. (Patient taking differently: Take 25 mg by mouth 3 (three) times daily as needed for dizziness or nausea. ) 90 tablet 1  . Melatonin 1 MG TABS Take 5 mg by mouth. 5 mg sometimes 10 mg    . pantoprazole (PROTONIX) 40 MG tablet TAKE 1 TABLET BY MOUTH TWICE DAILY. (Patient taking differently: Take 40 mg by mouth 2 (two) times daily. ) 180 tablet 1  . Pediatric Multiple Vitamins (FLINTSTONES MULTIVITAMIN PO) Take 1 tablet by mouth every evening.    . temazepam (RESTORIL) 15 MG capsule Take 1 capsule (15 mg total) by mouth at bedtime. 30 capsule 0  . traMADol (ULTRAM) 50 MG tablet TAKE (1) TABLET BY MOUTH EVERY EIGHT HOURS. (Patient taking differently: Take 50 mg by mouth every 8 (eight) hours as needed (pain). ) 90 tablet 0  . carvedilol (COREG) 25 MG tablet Take 1 tablet (25 mg total) by mouth 2 (two) times daily with a meal. 180 tablet 1   Current Facility-Administered Medications on File Prior to Visit  Medication Dose Route Frequency Provider Last Rate Last Admin  . cyanocobalamin ((VITAMIN B-12)) injection 1,000 mcg  1,000 mcg Intramuscular Once Eulas Post, MD        Allergies:   Allergies  Allergen Reactions  . Iodinated Diagnostic Agents Anaphylaxis and Other (See Comments)    IPD dye Info given by patient  . Iodine Anaphylaxis  . Ioxaglate Anaphylaxis and Other (See Comments)    Info given by patient  . Red Dye Anaphylaxis  . Lactose Intolerance (Gi)   . Whey Other (See Comments)     Lactose intolerance  . Hydralazine Anxiety and Other (See Comments)    Facial flushing, pt prefers not to use it.   . Milk-Related Compounds Other (See Comments)    Lactose intolerance Can tolerate milk if its cooked into the recipe, just can't drink milk   . Propoxyphene Rash and Anxiety     Physical Exam  Vitals:   07/28/19 1343  BP: 136/72  Pulse: 73  Temp: 97.6 F (36.4 C)  Weight: 100 lb (45.4 kg)  Height: 5\' 2"  (1.575 m)   Body mass index is 18.29 kg/m. No exam data present   General: Frail pleasant elderly Caucasian female, seated, in no evident distress Head: head normocephalic and atraumatic.   Neck: supple with no carotid or supraclavicular bruits Cardiovascular: irregular rate and rhythm, no murmurs Musculoskeletal: no deformity Skin:  no rash/petichiae Vascular:  Normal pulses all extremities   Neurologic Exam Mental Status: Awake and fully alert.  Mild to moderate dysarthria. Oriented to place and time. Recent and remote memory intact. Attention span, concentration and fund of knowledge appropriate. Mood and affect appropriate but would easily become tearful.  Cranial Nerves: Pupils equal, briskly reactive to light. Extraocular movements full without nystagmus. Visual fields full to confrontation. Hearing intact. Facial sensation intact.   Motor: Normal bulk and tone. Normal strength in all tested extremity muscles. Sensory.: intact to touch , pinprick , position and vibratory sensation.  Coordination: Rapid alternating movements normal in all extremities. Finger-to-nose and heel-to-shin performed accurately bilaterally. Gait and Station: Arises from chair without difficulty. Stance is normal. Gait demonstrates normal stride length and balance Reflexes: 1+ and symmetric. Toes downgoing.      Diagnostic Data (Labs, Imaging, Testing)  CT HEAD WO CONTRAST 03/06/2019 IMPRESSION: 1. Question loss of gray-white differentiation in the right frontal operculum,  M1 sector. No  evidence of hemorrhage or mass effect. Old infarctions in the right cerebellum and right radiating white matter tracts. 2. ASPECTS is 9  MR BRAIN WO CONTRAST MR MRA HEAD  MR MRA NECK 03/07/2019 IMPRESSION: MRI HEAD IMPRESSION:  1. Patchy small volume acute ischemic nonhemorrhagic right MCA territory infarct involving the right frontal operculum. 2. Underlying moderate chronic microvascular ischemic disease with scattered remote lacunar infarcts within the right basal ganglia and hemispheric cerebral white matter. 3. Superimposed chronic superior right cerebellar infarct.  MRA HEAD IMPRESSION:  1. Acute right M2 occlusion, consistent with the right MCA territory infarct. 2. Moderate atherosclerotic change elsewhere throughout the intracranial circulation. No other hemodynamically significant or correctable stenosis.  MRA NECK IMPRESSION:  1. Mild atheromatous irregularity about the carotid bifurcations/proximal ICAs bilaterally without hemodynamically significant or critical flow limiting stenosis. Both carotid artery systems otherwise widely patent within the neck. 2. Wide patency of the vertebral arteries within the neck. Left vertebral artery dominant  ECHOCARDIOGRAM 03/07/2019 IMPRESSIONS  1. The left ventricle has hyperdynamic systolic function, with an ejection fraction of >65%. The cavity size was normal. There is mildly increased left ventricular wall thickness. Left ventricular diastolic function could not be evaluated secondary to  atrial fibrillation. No evidence of left ventricular regional wall motion abnormalities.  2. The right ventricle has normal systolic function. The cavity was normal. There is no increase in right ventricular wall thickness.  3. Left atrial size was mildly dilated.  4. The mitral valve is abnormal. Mild thickening of the mitral valve leaflet. There is mild mitral annular calcification present.  5. The tricuspid valve is  grossly normal.  6. The aortic valve is tricuspid. Moderate calcification of the aortic valve. Aortic valve regurgitation is trivial by color flow Doppler. Moderate stenosis of the aortic valve.  7. The aorta is normal unless otherwise noted.  8. When compared to the prior study: 02/16/18: LVEF 50-55%, mild to moderate aortic stenosis.  9. The interatrial septum was not well visualized.    ASSESSMENT: [name] is a 79 y.o. year old female presented with slurred speech and left facial droop on 03/06/2019 with stroke work-up showing right MCA territory infarct with right M2 occlusion secondary to known atrial fibrillation on Coumadin.  She was transitioned to Eliquis. Vascular risk factors include HTN, HLD, CAD, CHF, A. fib, history of PE, history of prior stroke.  Residual deficits of dysarthria and occasional numbness/tingling on right side since stroke. She did recently undergo cardioversion on 07/08/2019 and at some point converted back into irregular rhythm.  She also had recent episode 2 days prior consisting of sternal chest pain radiating towards her right torso consisting of pressure and numbness along with headache, neck pain, left arm pain, nausea and diaphoresis    PLAN:  1. Right MCA stroke: Continue Eliquis (apixaban) daily  and lipitor for secondary stroke prevention. Maintain strict control of hypertension with blood pressure goal below 130/90, diabetes with hemoglobin A1c goal below 6.5% and cholesterol with LDL cholesterol (bad cholesterol) goal below 70 mg/dL.  I also advised the patient to eat a healthy diet with plenty of whole grains, cereals, fruits and vegetables, exercise regularly with at least 30 minutes of continuous activity daily and maintain ideal body weight. 2. HTN: Advised to continue current treatment regimen.  Today's BP 131/65.  Advised to continue to monitor at home along with continued follow-up with PCP for management 3. HLD: Advised to continue current  treatment regimen along with continued follow-up with PCP for  future prescribing and monitoring of lipid panel 4. Depression/anxiety: Patient's niece reports continued weight loss with lack of appetite, insomnia and increased anxiety.  Recommend initiating low-dose mirtazapine 7.5 mg nightly which may benefit with tension type headaches, insomnia, appetite stimulation and anxiety.  Discussion regarding potential adverse effects and was provided with additional information. 5. Atrial fibrillation: continue to follow with cardiology for ongoing monitoring and management.  Needs questions possible financial assistance or changing anticoagulation due to inability to afford Eliquis.  Advised to speak with cardiology office in regards to pursuing eligibility for financial assistance or change of anticoagulation 6. She was advised to reach out to cardiology in regards to recent episode due to concern of possible MI or related to irregular atrial fibrillation rhythm.  As event occurred 2 days prior and has not experienced any reoccurring or residual symptoms, no indication at this time to proceed to ED but discussed the importance of calling 911 if she experiences these symptoms again for further evaluation.  She did verbalize understanding.     Follow up in 4 months or call earlier if needed   Greater than 50% of time during this prolonged 45 minute visit was spent on counseling, discussion regarding recent episode likely cardiac related, discussion regarding ongoing headaches and underlying depression/anxiety, explanation of diagnosis of right MCA stroke, reviewing risk factor management of HTN, HLD and atrial fibrillation, planning of further management along with potential future management, and discussion with patient and family answering all questions.    Frann Rider, AGNP-BC  [facility] [address] [place], [place] [address]  Phone [phone] Fax  [phone] Note: This document was prepared with digital dictation and possible smart phrase technology. Any transcriptional errors that result from this process are unintentional.

## 2019-07-28 NOTE — Telephone Encounter (Signed)
Patient c/o Palpitations:  High priority if patient c/o lightheadedness, shortness of breath, or chest pain  1) How long have you had palpitations/irregular HR/ Afib? Are you having the symptoms now? Afib Off and on the last couple of days. Saturday she did not sleep.   2) Are you currently experiencing lightheadedness, SOB or CP? no  3) Do you have a history of afib (atrial fibrillation) or irregular heart rhythm? Yes, afib  4) Have you checked your BP or HR? (document readings if available): 138/72 - does not remember HR  5) Are you experiencing any other symptoms? Not experiencing any symptoms but she has been having headaches, felt clammy and had some chest pressure on Saturday. She is currently at the neurologist who advised her to call Dr. Debara Pickett.

## 2019-07-28 NOTE — Telephone Encounter (Signed)
Patient is returning phone call, following up in regards to palpitations. Please call to assist.

## 2019-07-29 ENCOUNTER — Other Ambulatory Visit: Payer: Self-pay

## 2019-07-29 ENCOUNTER — Ambulatory Visit (INDEPENDENT_AMBULATORY_CARE_PROVIDER_SITE_OTHER): Payer: Medicare Other | Admitting: Cardiology

## 2019-07-29 ENCOUNTER — Ambulatory Visit: Payer: Self-pay | Admitting: General Practice

## 2019-07-29 ENCOUNTER — Telehealth: Payer: Self-pay

## 2019-07-29 ENCOUNTER — Encounter: Payer: Self-pay | Admitting: Cardiology

## 2019-07-29 VITALS — BP 137/79 | HR 79 | Temp 98.1°F | Ht 62.0 in | Wt 97.0 lb

## 2019-07-29 DIAGNOSIS — I63411 Cerebral infarction due to embolism of right middle cerebral artery: Secondary | ICD-10-CM | POA: Diagnosis not present

## 2019-07-29 DIAGNOSIS — I4891 Unspecified atrial fibrillation: Secondary | ICD-10-CM | POA: Diagnosis not present

## 2019-07-29 DIAGNOSIS — I10 Essential (primary) hypertension: Secondary | ICD-10-CM | POA: Diagnosis not present

## 2019-07-29 DIAGNOSIS — R079 Chest pain, unspecified: Secondary | ICD-10-CM | POA: Diagnosis not present

## 2019-07-29 MED ORDER — ISOSORBIDE DINITRATE 30 MG PO TABS: 30.0000 mg | ORAL_TABLET | Freq: Two times a day (BID) | ORAL | 0 refills | Status: DC

## 2019-07-29 NOTE — Progress Notes (Signed)
Cardiology Office Note  Date: 07/29/2019   ID: [name], Got[place] [birth year], MRN [id number]  PCP:  Dorothyann Peng, NP  Cardiologist:  Pixie Casino, MD Electrophysiologist:  None   Chief Complaint  Patient presents with   Follow-up    Atrial fibrillation    History of Present Illness: [name] is a 79 y.o. female Last seen by Dr. Debara Pickett 06/17/2019 for Atrial fibrillation ( paroxsymal) and CHF. Previous hx of CP. Had an anaphylactic reaction to stress test and has significant allergies to contrast dye. Previous cardiacwith with medical treatment.   History of gastric Ca with endoscopic resection.  Recent hx of CVA in September with residual dysphagia and swallowing difficulty.  Recent successful DCCV to NSR with 2 stacked shocks 150 and 200 Joules 07/08/2019. Patient states she remained in NSR  Until 2 weeks ago then converted back to atrial fibrillation. EKG today shows A fib rate of 79 on arrival today.  Patient states she is having precordial chest pain since converting back to a fib. States Saturday night the chest pain radiated from xiphoid area to left chest and she had nausea and diaphoresis.  She rated the pain 6/10. In severity lasting approx. 1 hour duration. Today she states chest pain continues at 5/10 pain is reproducible at mid epigastric region and left anterior chest. She denies any associated symptoms. EKG shows no evidence of ischemic changes.   Patient states she was told by both Dr Debara Pickett and PA Rosalyn Gess that she may need a pacemaker.   Past Medical History:  Diagnosis Date   Anemia 2005   Generally microcytic, transfusions in [address], 2012, 02/2015, 05/2015   Aortic stenosis    Moderate November 2017   Arthritis    Broken back 2013   Chronic back pain.    CAD (coronary artery disease)    Cardiac catheterization 2014 - 80% mid RCA and 70% OM managed medically   CHF (congestive heart failure) ([name])    Chronic bilateral pleural effusions     Chronic headache    Contrast media allergy    Diastolic heart failure ([name])    Esophageal cancer ([name]) 05/06/2015   Adenocarcinoma GE junction   Facial paresthesia    GERD (gastroesophageal reflux disease)    H. pylori infection    H/O iron deficiency    05-06-15 iron infusion ([facility])   HH (hiatus hernia) 2008   Large with associated erosions   Hypertension    Paroxysmal atrial fibrillation ([name])    PONV (postoperative nausea and vomiting)    Pulmonary emboli ([place]) 2008, 2012   Stroke [facility]) 1982    Past Surgical History:  Procedure Laterality Date   APPENDECTOMY  1950s   BACK SURGERY     CARDIOVERSION N/A 07/08/2019   Procedure: CARDIOVERSION;  Surgeon: Pixie Casino, MD;  Location: [facility] ENDOSCOPY;  Service: Cardiovascular;  Laterality: N/A;   CARPAL TUNNEL RELEASE Bilateral [place]  2014   CHOLECYSTECTOMY  1980s   open   COLONOSCOPY N/A 02/17/2015   Procedure: COLONOSCOPY;  Surgeon: [name], MD;  Location: [facility] ENDOSCOPY;  Service: Endoscopy;  Laterality: N/A;   ESOPHAGOGASTRODUODENOSCOPY N/A 02/16/2015   Procedure: ESOPHAGOGASTRODUODENOSCOPY (EGD);  Surgeon: [name], MD;  Location: [facility] ENDOSCOPY;  Service: Endoscopy;  Laterality: N/A;   ESOPHAGOGASTRODUODENOSCOPY N/A 05/06/2015   Procedure: ESOPHAGOGASTRODUODENOSCOPY (EGD);  Surgeon: [name], MD;  Location: [facility] ENDOSCOPY;  Service: Endoscopy;  Laterality: N/A;   EUS N/A 05/20/2015  Procedure: UPPER ENDOSCOPIC ULTRASOUND (EUS) LINEAR;  Surgeon: [name], MD;  Location: [facility] ENDOSCOPY;  Service: Endoscopy;  Laterality: N/A;   exploratory lab  1950s or [place] N/A 05/06/2015   Procedure: GIVENS CAPSULE STUDY;  Surgeon: [name], MD;  Location: [facility] ENDOSCOPY;  Service: Endoscopy;  Laterality: N/A;   KNEE ARTHROSCOPY WITH MEDIAL MENISECTOMY Left 04/18/2018   Procedure: LEFT KNEE ARTHROSCOPY WITH PARTIAL MEDIAL MENISECTOMY AND ANTERIOR  CRUCIATE LIGAMENT DEBRIDEMENT;  Surgeon: [name], MD;  Location: [facility] ORS;  Service: Orthopedics;  Laterality: Left;   LEFT HEART CATH AND CORONARY ANGIOGRAPHY N/A 11/08/2016   Procedure: Left Heart Cath and Coronary Angiography;  Surgeon: [name], MD;  Location: [place] CV LAB;  Service: Cardiovascular;  Laterality: N/A;   lumbar back surgery  2012   LUMBAR DISC SURGERY  1991   TONSILLECTOMY     TONSILLECTOMY AND ADENOIDECTOMY  1960s    Current Outpatient Medications  Medication Sig Dispense Refill   acetaminophen (TYLENOL) 500 MG tablet Take 500-1,000 mg by mouth daily as needed for mild pain or moderate pain.      amLODipine (NORVASC) 5 MG tablet Take 1 tablet (5 mg total) by mouth daily. 90 tablet 3   atorvastatin (LIPITOR) 40 MG tablet Take 1 tablet (40 mg total) by mouth daily at 6 PM. (Patient taking differently: Take 40 mg by mouth daily. ) 90 tablet 3   carvedilol (COREG) 25 MG tablet Take 1 tablet (25 mg total) by mouth 2 (two) times daily with a meal. 180 tablet 1   ELIQUIS 5 MG TABS tablet TAKE (1) TABLET BY MOUTH TWICE DAILY. 180 tablet 3   furosemide (LASIX) 40 MG tablet TAKE 1 TABLET BY MOUTH ONCE DAILY. (Patient taking differently: Take 40 mg by mouth daily. ) 90 tablet 0   meclizine (ANTIVERT) 25 MG tablet Take 1 tablet (25 mg total) by mouth as needed for dizziness or nausea. (Patient taking differently: Take 25 mg by mouth 3 (three) times daily as needed for dizziness or nausea. ) 90 tablet 1   Melatonin 1 MG TABS Take 5 mg by mouth. 5 mg sometimes 10 mg     mirtazapine (REMERON) 7.5 MG tablet Take 1 tablet (7.5 mg total) by mouth at bedtime. 30 tablet 3   pantoprazole (PROTONIX) 40 MG tablet TAKE 1 TABLET BY MOUTH TWICE DAILY. (Patient taking differently: Take 40 mg by mouth 2 (two) times daily. ) 180 tablet 1   Pediatric Multiple Vitamins (FLINTSTONES MULTIVITAMIN PO) Take 1 tablet by mouth every evening.     temazepam (RESTORIL) 15 MG  capsule Take 1 capsule (15 mg total) by mouth at bedtime. 30 capsule 0   traMADol (ULTRAM) 50 MG tablet TAKE (1) TABLET BY MOUTH EVERY EIGHT HOURS. (Patient taking differently: Take 50 mg by mouth every 8 (eight) hours as needed (pain). ) 90 tablet 0   isosorbide dinitrate (ISORDIL) 30 MG tablet Take 1 tablet (30 mg total) by mouth 2 (two) times daily. 60 tablet 0   Current Facility-Administered Medications  Medication Dose Route Frequency Provider Last Rate Last Admin   cyanocobalamin ((VITAMIN B-12)) injection 1,000 mcg  1,000 mcg Intramuscular Once Eulas Post, MD       Allergies:  Iodinated diagnostic agents, Iodine, Ioxaglate, Red dye, Lactose intolerance (gi), Whey, Hydralazine, Milk-related compounds, and Propoxyphene   Social History: The patient  reports that she has never smoked. She has never used smokeless tobacco. She  reports that she does not drink alcohol or use drugs.   Family History: The patient's family history includes Emphysema in her father; Heart disease in her mother; Other in her child; Ovarian cancer in her sister; Stroke in her mother and sister.   ROS:  Please see the history of present illness. Otherwise, complete review of systems is positive for none.  All other systems are reviewed and negative.   Physical Exam: VS:  BP 137/79    Pulse 79    Temp 98.1 F (36.7 C) (Temporal)    Ht 5\' 2"  (1.575 m)    Wt 97 lb (44 kg)    SpO2 100%    BMI 17.74 kg/m , BMI Body mass index is 17.74 kg/m.  Wt Readings from Last 3 Encounters:  07/29/19 97 lb (44 kg)  07/28/19 100 lb (45.4 kg)  07/08/19 99 lb (44.9 kg)    General: Patient appears comfortable at rest. HEENT: Conjunctiva and lids normal, oropharynx clear with moist mucosa. Neck: Supple, no elevated JVP or carotid bruits, no thyromegaly. Lungs: Clear to auscultation, nonlabored breathing at rest. Cardiac: Regular rate and rhythm, no S3 or significant systolic murmur, no pericardial rub. Abdomen: Soft,  nontender, no hepatomegaly, bowel sounds present, no guarding or rebound. Extremities: No pitting edema, distal pulses 2+. Skin: Warm and dry. Musculoskeletal: No kyphosis. Neuropsychiatric: Alert and oriented x3, affect grossly appropriate.  ECG:  An ECG dated 07/08/2019 was personally reviewed today and demonstrated:  SB rate of 58  Recent Labwork: 03/06/2019: ALT 15; AST 17 03/12/2019: Magnesium 1.9 06/30/2019: Platelets 185 07/08/2019: BUN 15; Creatinine, Ser 0.90; Hemoglobin 12.6; Potassium 3.4; Sodium 140     Component Value Date/Time   CHOL 152 03/08/2019 0334   TRIG 70 03/08/2019 0334   HDL 45 03/08/2019 0334   CHOLHDL 3.4 03/08/2019 0334   VLDL 14 03/08/2019 0334   LDLCALC 93 03/08/2019 0334    Other Studies Reviewed Today: Echo 03/07/2019  1. The left ventricle has hyperdynamic systolic function, with an ejection fraction of >65%. The cavity size was normal. There is mildly increased left ventricular wall thickness. Left ventricular diastolic function could not be evaluated secondary to atrial fibrillation. No evidence of left ventricular regional wall motion abnormalities. 2. The right ventricle has normal systolic function. The cavity was normal. There is no increase in right ventricular wall thickness. 3. Left atrial size was mildly dilated. 4. The mitral valve is abnormal. Mild thickening of the mitral valve leaflet. There is mild mitral annular calcification present. 5. The tricuspid valve is grossly normal. 6. The aortic valve is tricuspid. Moderate calcification of the aortic valve. Aortic valve regurgitation is trivial by color flow Doppler. Moderate stenosis of the aortic valve. 7. The aorta is normal unless otherwise noted. 8. When compared to the prior study: 02/16/18: LVEF 50-55%, mild to moderate aortic stenosis. 9. The interatrial septum was not well visualized.  Carotid doppler study 12/12/2017 RIGHT CAROTID ARTERY: There is a minimal to moderate amount  of eccentric mixed echogenic plaque within the right carotid bulb (images 14 and 16), extending to involve the origin and proximal aspects of the right internal carotid artery (image 24), not resulting in elevated peak systolic velocities within the interrogated course the right internal carotid artery to suggest a hemodynamically significant stenosis.  RIGHT VERTEBRAL ARTERY:  Antegrade Flow  LEFT CAROTID ARTERY: There is a moderate to large amount of eccentric mixed echogenic plaque within the left carotid bulb (images 48 and 50), extending to involve  the origin and proximal aspects of the left internal carotid artery (image 59), not resulting in elevated peak systolic velocities within the interrogated course of the left internal carotid artery to suggest a hemodynamically significant stenosis.  LEFT VERTEBRAL ARTERY:  Antegrade flow  IMPRESSION: Moderate to large amount of bilateral atherosclerotic plaque, left greater than right, not resulting in a hemodynamically significant stenosis within either internal carotid artery.  MRA HEAD IMPRESSION: CVA 03/07/2019  1. Acute right M2 occlusion, consistent with the right MCA territory infarct. 2. Moderate atherosclerotic change elsewhere throughout the intracranial circulation. No other hemodynamically significant or correctable stenosis.  MRA NECK IMPRESSION: 1. Mild atheromatous irregularity about the carotid bifurcations/proximal ICAs bilaterally without hemodynamically significant or critical flow limiting stenosis. Both carotid artery systems otherwise widely patent within the neck. 2. Wide patency of the vertebral arteries within the neck. Left vertebral artery dominant   Cardiac catheterization Nov 08, 2016  There is hyperdynamic left ventricular systolic function.  The left ventricular ejection fraction is greater than 65% by visual estimate.  LV end diastolic pressure is moderately elevated.  Moderate  two-vessel disease: Ramus lesion, 60 %stenosed. RPDA lesion, 60 %stenosed.  There is mild aortic valve stenosis.   No obvious culprit lesion to explain the patient's angina  Assessment and Plan:  1. PAF (paroxysmal atrial fibrillation) ([place])   2. Cerebrovascular accident (CVA) due to embolism of right middle cerebral artery ([place])   3. Essential hypertension, benign   4. Atrial fibrillation status post cardioversion ([place])    1. PAF (paroxysmal atrial fibrillation) ([place]) Recent cardioversion on July 08, 2019 by Dr. Debara Pickett.  Patient states she was in normal sinus rhythm for 2 weeks then converted back to atrial fibrillation.  EKG today atrial fibrillation rate of 79.  Continue Eliquis 5 mg p.o. twice daily.  Patient is asking about possible placement of a pacemaker.  She states Dr. Debara Pickett had mentioned possible pacemaker at her last visit with him.  Informed patient I would forward today's note to him at her request.  2. Cerebrovascular accident (CVA) due to embolism of right middle cerebral artery ([place])  Recent presentation to ED on 03/06/2019 with difficulty speaking. MRI of the brain revealed right MCA infarct with occlusion of M2. Neurology saw the patient and transitioned patient from Coumadin to Eliquis. She continues with slight dysphasia today. No other focal neurological deficits.  3. Essential hypertension, benign BP 137/79 today.  Continue Coreg 25 mg p.o. twice daily.  Continue Lasix 40 mg daily.   4. Chest pain C/O of pre-cordial chest pain today. States she did not have chest pain prior DCCV. States pain starts mid epigastric area and radiates around to the left chest. She states this past Saturday she had 6/10 pain with accompanying nausea and diaphoresis. States the pain lasted around 1 hour and dissipated.  Pain is reproducible on palpation today in epigastric area and left upper chest.  Start isosorbide dinitrate 30 mg p.o. twice daily.  Patient had a previous cardiac  catheterization May 2018 showing moderate two-vessel disease, ramus lesion 60% stenosis, RPDA lesion 60% stenosis with mild aortic valve stenosis.   Medication Adjustments/Labs and Tests Ordered: Current medicines are reviewed at length with the patient today.  Concerns regarding medicines are outlined above.    Patient Instructions  Medication Instructions:  START Isosorbide Dinatrite 30mg  Take 1 tablet twice a day  *If you need a refill on your cardiac medications before your next appointment, please call your pharmacy*  Lab Work: None  If you have labs (blood work) drawn today and your tests are completely normal, you will receive your results only by:  [place] (if you have MyChart) OR  A paper copy in the mail If you have any lab test that is abnormal or we need to change your treatment, we will call you to review the results.  Testing/Procedures: None   Follow-Up: At [facility], you and your health needs are our priority.  As part of our continuing mission to provide you with exceptional heart care, we have created designated Provider Care Teams.  These Care Teams include your primary Cardiologist (physician) and Advanced Practice Providers (APPs -  Physician Assistants and Nurse Practitioners) who all work together to provide you with the care you need, when you need it.  Your next appointment:   FIRST AVAILABLE  The format for your next appointment:   In Person  Provider:   Raliegh Ip Mali Hilty, MD  Other Instructions         Signed, Levell July, NP 07/29/2019 4:28 PM    [facility] [place], [place], [place] [address] Phone: [phone]; Fax: [phone]

## 2019-07-29 NOTE — Telephone Encounter (Signed)
Received a call from patient she stated she is back in afib.Stated she had a cardioversion 07/08/19.Appointment scheduled with Kerin Ransom PA this afternoon at 2:00 pm.

## 2019-07-29 NOTE — Patient Instructions (Addendum)
Medication Instructions:  START Isosorbide Dinatrite 30mg  Take 1 tablet twice a day  *If you need a refill on your cardiac medications before your next appointment, please call your pharmacy*  Lab Work: None  If you have labs (blood work) drawn today and your tests are completely normal, you will receive your results only by: [name] MyChart Message (if you have MyChart) OR . A paper copy in the mail If you have any lab test that is abnormal or we need to change your treatment, we will call you to review the results.  Testing/Procedures: None   Follow-Up: At [facility], you and your health needs are our priority.  As part of our continuing mission to provide you with exceptional heart care, we have created designated Provider Care Teams.  These Care Teams include your primary Cardiologist (physician) and Advanced Practice Providers (APPs -  Physician Assistants and Nurse Practitioners) who all work together to provide you with the care you need, when you need it.  Your next appointment:   FIRST AVAILABLE  The format for your next appointment:   In Person  Provider:   K. Mali Hilty, MD  Other Instructions

## 2019-07-29 NOTE — Telephone Encounter (Signed)
Called patient about recent email no answer.[place].

## 2019-07-29 NOTE — Progress Notes (Signed)
I agree with the above plan 

## 2019-07-30 NOTE — Telephone Encounter (Signed)
Pt was seen yesterday by Kerin Ransom PA .Adonis Housekeeper

## 2019-08-14 ENCOUNTER — Other Ambulatory Visit: Payer: Self-pay | Admitting: Internal Medicine

## 2019-08-14 ENCOUNTER — Other Ambulatory Visit: Payer: Self-pay

## 2019-08-14 ENCOUNTER — Other Ambulatory Visit (HOSPITAL_COMMUNITY)
Admission: RE | Admit: 2019-08-14 | Discharge: 2019-08-14 | Disposition: A | Discharge status: Home / Self Care | Payer: Medicare Other | Source: Ambulatory Visit | Attending: Cardiovascular Disease | Admitting: Cardiovascular Disease

## 2019-08-14 ENCOUNTER — Ambulatory Visit (INDEPENDENT_AMBULATORY_CARE_PROVIDER_SITE_OTHER): Payer: Medicare Other | Admitting: Internal Medicine

## 2019-08-14 ENCOUNTER — Encounter: Payer: Self-pay | Admitting: Internal Medicine

## 2019-08-14 VITALS — BP 112/82 | HR 82 | Ht 62.0 in | Wt 101.6 lb

## 2019-08-14 DIAGNOSIS — I2510 Atherosclerotic heart disease of native coronary artery without angina pectoris: Secondary | ICD-10-CM

## 2019-08-14 DIAGNOSIS — Z01812 Encounter for preprocedural laboratory examination: Secondary | ICD-10-CM | POA: Diagnosis not present

## 2019-08-14 DIAGNOSIS — I200 Unstable angina: Secondary | ICD-10-CM | POA: Diagnosis not present

## 2019-08-14 DIAGNOSIS — Z20822 Contact with and (suspected) exposure to covid-19: Secondary | ICD-10-CM | POA: Insufficient documentation

## 2019-08-14 DIAGNOSIS — Z7901 Long term (current) use of anticoagulants: Secondary | ICD-10-CM

## 2019-08-14 LAB — SARS CORONAVIRUS 2 (TAT 6-24 HRS): SARS Coronavirus 2: NEGATIVE

## 2019-08-14 MED ORDER — PREDNISONE 50 MG PO TABS: ORAL_TABLET | ORAL | 0 refills | Status: DC

## 2019-08-14 NOTE — Patient Instructions (Addendum)
Medication Instructions:  Take pre-medication regimen of prednisone & benadryl as direct before heart cath   Dr. Debara Pickett is removing isosorbide dinitrate from your med list, as you had not started it yet   *If you need a refill on your cardiac medications before your next appointment, please call your pharmacy*  Lab Work: [place] today   If you have labs (blood work) drawn today and your tests are completely normal, you will receive your results only by: [name] MyChart Message (if you have MyChart) OR . A paper copy in the mail If you have any lab test that is abnormal or we need to change your treatment, we will call you to review the results.  Testing/Procedures: Dr. Debara Pickett has recommended a LEFT HEART CATHETERIZATION This is done at [facility]  Instructions are below   You will need to have the coronavirus test completed prior to your procedure. An appointment has been made TODAY @ 11:15am. This is a Drive Up Visit at the [facility] [address]. Please tell them that you are there for pre-procedure testing. Someone will direct you to the appropriate testing line. Stay in your car and someone will be with you shortly. Please make sure to have all other labs completed before this test because you will need to stay quarantined until your procedure. Please take your insurance card to this test.    Follow-Up: At [facility], you and your health needs are our priority.  As part of our continuing mission to provide you with exceptional heart care, we have created designated Provider Care Teams.  These Care Teams include your primary Cardiologist (physician) and Advanced Practice Providers (APPs -  Physician Assistants and Nurse Practitioners) who all work together to provide you with the care you need, when you need it.  Your next appointment:   3-4 week(s) - after heart cath  The format for your next appointment:   In Person  Provider:   K. Mali Hilty, MD - OK to  double book if needed  Other Instructions     [place] [address] Dept: [place]: [place]  08/14/2019  You are scheduled for a Cardiac Catheterization on Tuesday, February 16 with Dr. Shelva Majestic.  1. Please arrive at the [facility] (Main Entrance A) at [facility]: [address] [place], [place] [address] at 5:30 AM (This time is two hours before your procedure to ensure your preparation). Free valet parking service is available. Cath is at 7:30am  Special note: Every effort is made to have your procedure done on time. Please understand that emergencies sometimes delay scheduled procedures.  2. Diet: Do not eat solid foods after midnight.  The patient may have clear liquids until 5am upon the day of the procedure.  3. Labs: CBC & BMET to be completed today. You do not need to be fasting.  4. Medication instructions in preparation for your procedure:  Contrast Allergy:  Please take Prednisone 50mg  by mouth at:  Thirteen hours prior to cath  Seven hours prior to cath   Prior to leaving home, take last dose of prednisone & benadyl 50mg   HOLD Eliquis 2 days prior to heart cath procedure - last dose Saturday 2/13 in PM   On the morning of your procedure, take your Aspirin 81mg  and any morning medicines NOT listed above.  You may use  sips of water.  5. Plan for one night stay--bring personal belongings. 6. Bring a current list of your medications and current insurance cards. 7. You MUST have a responsible person to drive you home. 8. Someone MUST be with you the first 24 hours after you arrive home or your discharge will be delayed. 9. Please wear clothes that are easy to get on and off and wear slip-on shoes.  Thank you for allowing [place] to care for you!   -- [place] [facility]

## 2019-08-14 NOTE — Progress Notes (Signed)
OFFICE NOTE  Chief Complaint:  Follow-up afib, chest pain  Primary Care Physician: Dorothyann Peng, NP  HPI:  AHLEY [name] is a pleasant 79 year old female kindly referred to me be Dr. Ardeth Perfect. She reports a past medical history significant for atrial fibrillation and congestive heart failure, which occurred around the same time and probably 2012. She vaguely recalls seeing Dr. Irish Lack at that time.  She was started on warfarin therapy and no attempts were made to get her back into rhythm as she was paroxysmal. She does has a long-standing history of hypertension and dyslipidemia. She apparently developed heart failure or however has never had reassessment of her ejection fraction. She subsequently was having health issues and decided to move with her son to live in [place]. She says at that time she had some problems with chest pain and ultimately underwent a stress test. This was associated with an anaphylactic type reaction and despite their reassurances she will "no longer have any more stress test". She says she has a severe allergy to IV contrast dye which causes throat swelling as well as milk which also causes the same symptoms. Apparently she also underwent cardiac catheterization however she says that only medical therapy was recommended. This was in [place], [place].  We are trying to request records from there as well. She subsequently moved with her son to [facility], however he lost his job and she recently moved back to [place]. Currently she denies any significant shortness of breath but does report some mild leg edema which is slightly worse and occasional pain in her left chest and left arm over the last several days. She's also had pain in the right chest. None of those symptoms are worse with exertion or relieved by rest or medication.   [name] returns today for followup. She again appears upset and quite nervous. Blood pressure recently has been significantly  elevated. In fact she went to the emergency room and was hospitalized for blood pressure control. Although it seems that her blood pressure more easily was controlled once her oral medicines were restarted. Subsequently she's been started on clonidine is now up to twice a day. Blood pressure today was still high at [id number] systolic. She reports some occasional chest discomfort, mostly at rest and reports not being active enough to know if she has symptoms with exertion. She also has pain all over body significant low back pain and is seeing a specialist about that in the next few weeks.  I saw [name] back in the office today. She reports some improvement in her chest discomfort and I think this is related to blood pressure. The recent switch in her blood pressure medicines indicate a marked improvement in her pressures in she is noted to be 130/66 today. She does get some slight shortness of breath with exertion and no regular chest discomfort. At this time there is not clear evidence to pursue a further workup although I have a low threshold for further cardiac workup if she should become more symptomatic. She is in sinus rhythm and seems to be maintaining that.  [name] returns today for follow-up. She's had a remarkable past 6 months. Unfortunately she was diagnosed with a gastric cancer which was considered very high risk to resect. She was referred to [facility] for an endoscopic resection which was successful with clean margins. Prior to that she underwent cardiac evaluation by [facility] cardiology including a nuclear stress test which was low risk and  didn't involve exercise. She reports that during the last minute of exercise she went into A. fib. She's had recurrent A. fib on and off and in fact was just hospitalized again for A. fib a few weeks ago which converted spontaneously back to sinus rhythm, prior to cardiology is evaluating her. She was then directed to follow-up with me. She has  fortunately been on warfarin with therapeutic INRs. Of note, I did review her cardiac catheterization which was performed at an outside hospital 2014 which did demonstrate an 80% mid RCA stenosis as well as a 70% OM lesion and some mild to moderate disease of other vessels. At the time no stent was recommended, which leads me to believe these may be smaller vessels. The fact that her most recent stress test was again negative suggest that they are not flow limiting even though they would seem significant. Also, today she is describing left hand pain. She says that there is exquisite tenderness the left hand and it's difficult for her to extend her finger. She often notes that her left middle finger gets stuck. On palpation I do not notice any significant arthritis of the joints or nodules on the tendons.  04/19/2016  [name] returns today for follow-up. She is concerned about shortness of breath which is been progressive. She reports recently she's had some loss of vocal power and noted some upper airway wheezing. We and related with her on the office today and her oxygen saturations were noted to be 97% at rest and that came down to 95% with ambulation. EKG shows sinus rhythm at 66. Blood pressure is mildly elevated today. She has a nonproductive cough but denies fever, chills or sweats. Weight is up to 141 from 130 pounds 6 months ago.  07/11/2015  [name] returns today for follow-up. She seems to be doing fairly well. She denies any worsening shortness of breath or chest pain. She seems to be suffering from some acid reflux and will likely be undergoing another EGD with Dr. Benson Norway. She continues to have problems with voice changes which could be due to vocal cord inflammation related to her reflux.  09/21/2016  [name] is here today for follow-up. She had an INR check in the office. She is scheduled to have upcoming surgery for large hiatal hernia. Warm to [facility] by Dr. Precious Bard. Risk  factors for surgery including aortic stenosis, however this is recently assessed and is moderate. She denies any chest pain or anginal symptoms.  11/02/2016  Mrs. Johanns returns for follow-up today. Recently she's been having uncontrolled hypertension. 5 blood pressures been over [id number] systolic. She's not had a history with difficult to control hypertension in the past. He's also had associated chest discomfort. The other day she had severe chest discomfort and she has had headaches. In 2013 she underwent cardia catheterization in [place] which showed some moderate coronary disease including what appears to be an 80% stenosis of the RCA however was not treated at the time. She also had proximal LAD disease which was mild to moderate. I'm concerned that she's had progression of her coronary artery disease as a possible cause of her uncontrolled hypertension. She reports the pain has occurred at rest but can occur with exertion. She also gets short of breath with exertion. She's felt more fatigue as well. The pain is described as sharp and nonradiating in the central chest. Unfortunate she's on warfarin for PAF and will need to hold that for 5 days prior  to the procedure to safely undergo cardiac catheterization. Although he recently cleared her for hernia repair, she never underwent the surgery as she was felt to be at higher risk. Given this new information I would not recommend hernia surgery in the near future until we have a better idea of her coronary arteries.  11/16/2016  Mrs. Haapala returns for follow-up. She underwent heart catheterization by Dr. Ellyn Hack on 11/08/2016 which showed hyperdynamic LVEF greater than 65%, there was a moderate 60% ramus lesion and 60% RPDA lesion which was not thought to be significant. There was mild aortic valvular stenosis. Blood pressure was elevated. Medical therapy including increased blood pressure control was recommended. Since discharge she has done well. She denied any  competitions from the right femoral access site. She's had one or 2 brief episodes of chest discomfort. Unfortunately after I saw her in the office at the last visit she fell in the parking lot by the [employer] caf[employer]ia. Ultimately she fractured her right radial styloid. That is now casted and in a sling. She does not anticipate the need for surgery, but she would be considered acceptable risk for surgery. Blood pressure is improved however not yet at goal.  02/19/2017  Mrs. Niang is seen today in follow-up. She is doing well other than painful osteoarthritis. Her blood pressures been better controlled although she is in need of refills. She was found to have moderate aortic stenosis by last echo however was only noted to be mild by cardiac catheterization. She'll need a repeat echo in about 6 months.  09/10/2016  Mrs. Darius returns today for follow-up.  She is under a lot of stress today.  Her sister is in the hospital and had a repeat heart surgery.  Mrs. Hakola his blood pressure is much better controlled today 114/60.  Overall she feels well and denies chest pain or worsening shortness of breath.  As noted she did have moderate coronary artery disease and even some severe distal coronary disease managed medically by heart cath in 2014.  Recently she had a lipid profile last week showing total cholesterol 182, triglycerides 109, HDL 54, LDL 107.  She is on simvastatin 20 mg.  She does have moderate aortic stenosis by echo in 05/2016.  03/20/2018  Mrs. Merriam is seen today in follow-up.  She has multiple complaints today including significant chest and back pain, weakness, fatigue.  Work-up recently including hospitalization demonstrated some acute heart failure with LVEF 50 to 55%.  She has permanent A. fib at this point and is not likely that that is the main cause of her weakness.  She was diuresed and is also noted to have mild to moderate aortic stenosis with a mean gradient of 14 mmHg in August.   Again I do not think this is likely the cause of her multiple somatic complaints.  She does have diffuse arthritis and neuropathy and is on medication for that.  She may very well need pain management.  12/25/2018  Ms. Lappin is seen today in follow-up.  She is done fairly well over the past 6 months.  Her echo last year showed stable LVEF 50 to 55% with mild to moderate aortic stenosis.  She still has complaints of generalized pain.  She denies any congestive heart failure symptoms.  She does note that she goes in and out of A. fib however is not worsened with her symptoms regarding that.  Her INRs have been therapeutic on warfarin.  She is overdue for lipid  profile.  Blood pressure also has been poorly controlled at home in general [id number] systolic.  [id number]  Ms. Noury is seen today in follow-up.  Unfortunately she had a stroke in September.  She is now back in A. fib.  She was converted from warfarin to Eliquis.  Since then she has been significantly fatigued with no energy.  She still remains borderline underweight with a BMI of 18 at 102 pounds.  She was seen by Kerin Ransom, PA-C, who noted she was still in persistent A. fib but rate controlled.  He adjusted her medicines further.  Today her heart rhythm is irregular on exam.  Heart rate in the 50s.  I suspect this is persistent A. fib.  She has now been on anticoagulation for more than 3 weeks, actually more than 2 months, and we discussed the possibility of an elective cardioversion.  08/14/2019  Ms. Wormald returns today for follow-up.  Recently she was seen in the office by Levell July, NP, who was newly hired.  She had recently underwent cardioversion by myself but then had complained of some chest pain.  In fact prior to her cardioversion she had a significant episode of chest pain which she did not initially elaborate on, associated with pain in the left chest, down her left arm and there was nausea and diaphoresis.  She did successfully  convert to sinus rhythm however reverted back to A. fib within about 2 weeks.  During that time when she was in sinus she does say that she felt better.  On follow-up in the office on January 26, she was noted to be in A. fib and felt to be having symptoms of unstable angina.  There was discussion saying that I had recommended the pacemaker for her A. fib, but I had never made that recommendation.  In fact there is a misconception that a pacemaker regulates atrial fibrillation but I cleared this up for her.  It was recommended that she start isosorbide dinitrate twice daily.  Unfortunately, her interaction the office was not pleasant and she never started the isosorbide.  She claims that she reached out to her pharmacy however they said they could not get the medication, probably because this needs prior authorization.  She does have known coronary disease with moderate two-vessel disease by cath in May 2018.  It is possible that she has had recurrent A. fib due to ischemia.  EKG today shows A. fib at 82 without obvious ischemic changes.  PMHx:  Past Medical History:  Diagnosis Date  . Anemia 2005   Generally microcytic, transfusions in [address], 2012, 02/2015, 05/2015  . Aortic stenosis    Moderate November 2017  . Arthritis   . Broken back 2013   Chronic back pain.   [name] CAD (coronary artery disease)    Cardiac catheterization 2014 - 80% mid RCA and 70% OM managed medically  . CHF (congestive heart failure) ([facility])   . Chronic bilateral pleural effusions   . Chronic headache   . Contrast media allergy   . Diastolic heart failure ([facility])   . Esophageal cancer ([facility]) 05/06/2015   Adenocarcinoma GE junction  . Facial paresthesia   . GERD (gastroesophageal reflux disease)   . H. pylori infection   . H/O iron deficiency    05-06-15 iron infusion ([facility])  . HH (hiatus hernia) 2008   Large with associated erosions  . Hypertension   . Paroxysmal atrial fibrillation ([name])   . PONV (postoperative nausea and  vomiting)   .  Pulmonary emboli ([facility]) 2008, 2012  . Stroke [facility]) 1982    Past Surgical History:  Procedure Laterality Date  . APPENDECTOMY  1950s  . BACK SURGERY    . CARDIOVERSION N/A 07/08/2019   Procedure: CARDIOVERSION;  Surgeon: Pixie Casino, MD;  Location: [place];  Service: Cardiovascular;  Laterality: N/A;  . CARPAL TUNNEL RELEASE Bilateral 1990s  . [place] SURGERY  2014  . CHOLECYSTECTOMY  1980s   open  . COLONOSCOPY N/A 02/17/2015   Procedure: COLONOSCOPY;  Surgeon: [name], MD;  Location: [facility] ENDOSCOPY;  Service: Endoscopy;  Laterality: N/A;  . ESOPHAGOGASTRODUODENOSCOPY N/A 02/16/2015   Procedure: ESOPHAGOGASTRODUODENOSCOPY (EGD);  Surgeon: [name], MD;  Location: [facility];  Service: Endoscopy;  Laterality: N/A;  . ESOPHAGOGASTRODUODENOSCOPY N/A 05/06/2015   Procedure: ESOPHAGOGASTRODUODENOSCOPY (EGD);  Surgeon: [name], MD;  Location: [facility] ENDOSCOPY;  Service: Endoscopy;  Laterality: N/A;  . EUS N/A 05/20/2015   Procedure: UPPER ENDOSCOPIC ULTRASOUND (EUS) LINEAR;  Surgeon: [name], MD;  Location: [facility] ENDOSCOPY;  Service: Endoscopy;  Laterality: N/A;  . exploratory lab  1950s or 1960s  . GIVENS CAPSULE STUDY N/A 05/06/2015   Procedure: GIVENS CAPSULE STUDY;  Surgeon: [name], MD;  Location: [facility] ENDOSCOPY;  Service: Endoscopy;  Laterality: N/A;  . KNEE ARTHROSCOPY WITH MEDIAL MENISECTOMY Left 04/18/2018   Procedure: LEFT KNEE ARTHROSCOPY WITH PARTIAL MEDIAL MENISECTOMY AND ANTERIOR CRUCIATE LIGAMENT DEBRIDEMENT;  Surgeon: [name], MD;  Location: [facility];  Service: Orthopedics;  Laterality: Left;  . LEFT HEART CATH AND CORONARY ANGIOGRAPHY N/A 11/08/2016   Procedure: Left Heart Cath and Coronary Angiography;  Surgeon: [name], MD;  Location: [facility];  Service: Cardiovascular;  Laterality: N/A;  . lumbar back surgery  2012  . [place] SURGERY  1991  . TONSILLECTOMY    . TONSILLECTOMY AND ADENOIDECTOMY   1960s    FAMHx:  Family History  Problem Relation Age of Onset  . Stroke Mother   . Heart disease Mother   . Emphysema Father   . Ovarian cancer Sister   . Stroke Sister   . Other Child        died at birth    SOCHx:   reports that she has never smoked. She has never used smokeless tobacco. She reports that she does not drink alcohol or use drugs.  ALLERGIES:  Allergies  Allergen Reactions  . Iodinated Diagnostic Agents Anaphylaxis and Other (See Comments)    IPD dye Info given by patient  . Iodine Anaphylaxis  . Ioxaglate Anaphylaxis and Other (See Comments)    Info given by patient  . Red Dye Anaphylaxis  . Lactose Intolerance (Gi)   . Whey Other (See Comments)    Lactose intolerance  . Hydralazine Anxiety and Other (See Comments)    Facial flushing, pt prefers not to use it.   . Milk-Related Compounds Other (See Comments)    Lactose intolerance Can tolerate milk if its cooked into the recipe, just can't drink milk   . Propoxyphene Rash and Anxiety    ROS: Pertinent items noted in HPI and remainder of comprehensive ROS otherwise negative.  HOME MEDS: Current Outpatient Medications  Medication Sig Dispense Refill  . acetaminophen (TYLENOL) 500 MG tablet Take 500-1,000 mg by mouth daily as needed for mild pain or moderate pain.     [name] amLODipine (NORVASC) 5 MG tablet Take 1 tablet (5 mg total) by mouth daily. 90 tablet 3  . atorvastatin (  LIPITOR) 40 MG tablet Take 1 tablet (40 mg total) by mouth daily at 6 PM. (Patient taking differently: Take 40 mg by mouth daily. ) 90 tablet 3  . carvedilol (COREG) 25 MG tablet Take 1 tablet (25 mg total) by mouth 2 (two) times daily with a meal. 180 tablet 1  . ELIQUIS 5 MG TABS tablet TAKE (1) TABLET BY MOUTH TWICE DAILY. 180 tablet 3  . furosemide (LASIX) 40 MG tablet TAKE 1 TABLET BY MOUTH ONCE DAILY. (Patient taking differently: Take 40 mg by mouth daily. ) 90 tablet 0  . isosorbide dinitrate (ISORDIL) 30 MG tablet Take 1  tablet (30 mg total) by mouth 2 (two) times daily. 60 tablet 0  . meclizine (ANTIVERT) 25 MG tablet Take 1 tablet (25 mg total) by mouth as needed for dizziness or nausea. (Patient taking differently: Take 25 mg by mouth 3 (three) times daily as needed for dizziness or nausea. ) 90 tablet 1  . Melatonin 1 MG TABS Take 5 mg by mouth. 5 mg sometimes 10 mg    . mirtazapine (REMERON) 7.5 MG tablet Take 1 tablet (7.5 mg total) by mouth at bedtime. 30 tablet 3  . pantoprazole (PROTONIX) 40 MG tablet TAKE 1 TABLET BY MOUTH TWICE DAILY. (Patient taking differently: Take 40 mg by mouth 2 (two) times daily. ) 180 tablet 1  . Pediatric Multiple Vitamins (FLINTSTONES MULTIVITAMIN PO) Take 1 tablet by mouth every evening.    . temazepam (RESTORIL) 15 MG capsule Take 1 capsule (15 mg total) by mouth at bedtime. 30 capsule 0  . traMADol (ULTRAM) 50 MG tablet TAKE (1) TABLET BY MOUTH EVERY EIGHT HOURS. (Patient taking differently: Take 50 mg by mouth every 8 (eight) hours as needed (pain). ) 90 tablet 0   Current Facility-Administered Medications  Medication Dose Route Frequency Provider Last Rate Last Admin  . cyanocobalamin ((VITAMIN B-12)) injection 1,000 mcg  1,000 mcg Intramuscular Once Eulas Post, MD        LABS/IMAGING: No results found for this or any previous visit (from the past 48 hour(s)). No results found.  VITALS: There were no vitals taken for this visit.  EXAM: General appearance: alert and no distress Neck: no carotid bruit and no JVD Lungs: clear to auscultation bilaterally Heart: regular rate and rhythm, S1, S2 normal and systolic murmur: midsystolic 3/6, crescendo at 2nd right intercostal space Abdomen: soft, non-tender; bowel sounds normal; no masses,  no organomegaly Extremities: normal Pulses: 2+ and symmetric Skin: Skin color, texture, turgor normal. No rashes or lesions Neurologic: Grossly normal Psych: Pleasant  EKG: A. fib at 82-personally  reviewed  ASSESSMENT: 1. Unstable angina - cath showed two-vessel moderate CAD and mild aortic stenosis (10/2016) 2. Recent stroke (affecting bulbar symptoms) 3. Uncontrolled hypertension 4. Paroxysmal atrial fibrillation -on Eliquis, cardioverted in 07/2019, then reverted back to afib 5. History of diastolic congestive heart failure - EF 50-55% on echo 6. Dyslipidemia 7. Moderate aortic stenosis 8. Right hand pain with contracture 9. Recent GI cancer/resected  PLAN: 1.   Mrs. Leclerc has been describing symptoms concerning for unstable angina.  She underwent successful cardioversion and maintained sinus for about 2 weeks and then reverted back to A. fib.  It could be that she is ischemic and that is the reason that she reverted back to A. fib.  She did have at least moderate coronary disease in 2 vessels by cath in 2018.  She did not start long-acting nitrate due to the medicine not being  prior authorized.  At this point, I think she needs repeat definitive cardiac catheterization for symptoms of unstable angina.  She may ultimately require PCI.  We will need to hold her Eliquis 2 days prior to the procedure.  She also has history of dye allergy which caused anaphylaxis however in the early 1980s.  I explained to her that dyes are much different today and that she had contrast dye in 2018 with premedication and tolerated it well.  We will again premedicate her for this procedure and advised her to take aspirin on the day of the procedure.  Ultimately if she does require PCI then she will likely be on triple therapy for at least a month after which we can likely discontinue aspirin and remain on Plavix and Eliquis.  She would need at least a month before considering a repeat cardioversion plus or minus the addition of antiarrhythmic therapy.  She is in agreement with this plan.  We discussed the risk, benefits and alternatives cardiac catheterization as well as the need for premedication for contrast dye  allergy and she is agreeable to proceed.  Follow-up with me afterwards.  Pixie Casino, MD, [facility], [place] Director of the [facility]  [facility] Diplomate of the American Board of Clinical Lipidology Attending Cardiologist  Direct Dial: [phone]  Fax: [phone]  Website:  [email or web address] 08/14/2019, 9:57 AM

## 2019-08-14 NOTE — H&P (View-Only) (Signed)
OFFICE NOTE  Chief Complaint:  Follow-up afib, chest pain  Primary Care Physician: Miranda Peng, NP  HPI:  [name] is a pleasant 79 year old female kindly referred to me be [name]. She reports a past medical history significant for atrial fibrillation and congestive heart failure, which occurred around the same time and probably 2012. She vaguely recalls seeing [name] at that time.  She was started on warfarin therapy and no attempts were made to get her back into rhythm as she was paroxysmal. She does has a long-standing history of hypertension and dyslipidemia. She apparently developed heart failure or however has never had reassessment of her ejection fraction. She subsequently was having health issues and decided to move with her son to live in [name]. She says at that time she had some problems with chest pain and ultimately underwent a stress test. This was associated with an anaphylactic type reaction and despite their reassurances she will "no longer have any more stress test". She says she has a severe allergy to IV contrast dye which causes throat swelling as well as milk which also causes the same symptoms. Apparently she also underwent cardiac catheterization however she says that only medical therapy was recommended. This [name]  We are trying to request records from there as well. She subsequently moved with her son to [name], however he lost his job and she recently moved back to [name]. Currently she denies any significant shortness of breath but does report some mild leg edema which is slightly worse and occasional pain in her left chest and left arm over the last several days. She's also had pain in the right chest. None of those symptoms are worse with exertion or relieved by rest or medication.   [name] returns today for followup. She again appears upset and quite nervous. Blood pressure recently has been significantly  elevated. In fact she went to the emergency room and was hospitalized for blood pressure control. Although it seems that her blood pressure more easily was controlled once her oral medicines were restarted. Subsequently she's been started on clonidine is now up to twice a day. Blood pressure today was still high at [id number] systolic. She reports some occasional chest discomfort, mostly at rest and reports not being active enough to know if she has symptoms with exertion. She also has pain all over body significant low back pain and is seeing a specialist about that in the next few weeks.  I saw [name] back in the office today. She reports some improvement in her chest discomfort and I think this is related to blood pressure. The recent switch in her blood pressure medicines indicate a marked improvement in her pressures in she is noted to be 130/66 today. She does get some slight shortness of breath with exertion and no regular chest discomfort. At this time there is not clear evidence to pursue a further workup although I have a low threshold for further cardiac workup if she should become more symptomatic. She is in sinus rhythm and seems to be maintaining that.  [name] returns today for follow-up. She's had a remarkable past 6 months. Unfortunately she was diagnosed with a gastric cancer which was considered very high risk to resect. She was referred to [name] for an endoscopic resection which was successful with clean margins. Prior to that she underwent cardiac evaluation by [facility] cardiology including a nuclear stress test which was low risk and  didn't involve exercise. She reports that during the last minute of exercise she went into A. fib. She's had recurrent A. fib on and off and in fact was just hospitalized again for A. fib a few weeks ago which converted spontaneously back to sinus rhythm, prior to cardiology is evaluating her. She was then directed to follow-up with me. She has  fortunately been on warfarin with therapeutic INRs. Of note, I did review her cardiac catheterization which was performed at an outside [name] 2014 which did demonstrate an 80% mid RCA stenosis as well as a 70% OM lesion and some mild to moderate disease of other vessels. At the time no stent was recommended, which leads me to believe these may be smaller vessels. The fact that her most recent stress test was again negative suggest that they are not flow limiting even though they would seem significant. Also, today she is describing left hand pain. She says that there is exquisite tenderness the left hand and it's difficult for her to extend her finger. She often notes that her left middle finger gets stuck. On palpation I do not notice any significant arthritis of the joints or nodules on the tendons.  04/19/2016  [name] returns today for follow-up. She is concerned about shortness of breath which is been progressive. She reports recently she's had some loss of vocal power and noted some upper airway wheezing. We and related with her on the office today and her oxygen saturations were noted to be 97% at rest and that came down to 95% with ambulation. EKG shows sinus rhythm at 66. Blood pressure is mildly elevated today. She has a nonproductive cough but denies fever, chills or sweats. Weight is up to 141 from 130 pounds 6 months ago.  07/11/2015  [name] returns today for follow-up. She seems to be doing fairly well. She denies any worsening shortness of breath or chest pain. She seems to be suffering from some acid reflux and will likely be undergoing another EGD with [name]. She continues to have problems with voice changes which could be due to vocal cord inflammation related to her reflux.  09/21/2016  [name] is here today for follow-up. She had an INR check in the office. She is scheduled to have upcoming surgery for large hiatal hernia. [name]. Risk  factors for surgery including aortic stenosis, however this is recently assessed and is moderate. She denies any chest pain or anginal symptoms.  11/02/2016  [name] returns for follow-up today. Recently she's been having uncontrolled hypertension. 5 blood pressures been over [id number] systolic. She's not had a history with difficult to control hypertension in the past. He's also had associated chest discomfort. The other day she had severe chest discomfort and she has had headaches. In 2013 she underwent cardia catheterization in [name] which showed some moderate coronary disease including what appears to be an 80% stenosis of the RCA however was not treated at the time. She also had proximal LAD disease which was mild to moderate. I'm concerned that she's had progression of her coronary artery disease as a possible cause of her uncontrolled hypertension. She reports the pain has occurred at rest but can occur with exertion. She also gets short of breath with exertion. She's felt more fatigue as well. The pain is described as sharp and nonradiating in the central chest. Unfortunate she's on warfarin for PAF and will need to hold that for 5 days prior  to the procedure to safely undergo cardiac catheterization. Although he recently cleared her for hernia repair, she never underwent the surgery as she was felt to be at higher risk. Given this new information I would not recommend hernia surgery in the near future until we have a better idea of her coronary arteries.  11/16/2016  [name] returns for follow-up. She underwent heart catheterization by Dr. Ellyn Hack on 11/08/2016 which showed hyperdynamic LVEF greater than 65%, there was a moderate 60% ramus lesion and 60% RPDA lesion which was not thought to be significant. There was mild aortic valvular stenosis. Blood pressure was elevated. Medical therapy including increased blood pressure control was recommended. Since discharge she has done well. She denied any  competitions from the right femoral access site. She's had one or 2 brief episodes of chest discomfort. Unfortunately after I saw her in the office at the last visit she fell in the parking lot by the [employer] caf[employer]ia. Ultimately she fractured her right radial styloid. That is now casted and in a sling. She does not anticipate the need for surgery, but she would be considered acceptable risk for surgery. Blood pressure is improved however not yet at goal.  02/19/2017  [name] is seen today in follow-up. She is doing well other than painful osteoarthritis. Her blood pressures been better controlled although she is in need of refills. She was found to have moderate aortic stenosis by last echo however was only noted to be mild by cardiac catheterization. She'll need a repeat echo in about 6 months.  09/10/2016  [name] returns today for follow-up.  She is under a lot of stress today.  Her sister is in [name] and had a repeat heart surgery.  [name] his blood pressure is much better controlled today 114/60.  Overall she feels well and denies chest pain or worsening shortness of breath.  As noted she did have moderate coronary artery disease and even some severe distal coronary disease managed medically by heart cath in 2014.  Recently she had a lipid profile last week showing total cholesterol 182, triglycerides 109, HDL 54, LDL 107.  She is on simvastatin 20 mg.  She does have moderate aortic stenosis by echo in 05/2016.  03/20/2018  [name] is seen today in follow-up.  She has multiple complaints today including significant chest and back pain, weakness, fatigue.  Work-up recently including hospitalization demonstrated some acute heart failure with LVEF 50 to 55%.  She has permanent A. fib at this point and is not likely that that is the main cause of her weakness.  She was diuresed and is also noted to have mild to moderate aortic stenosis with a mean gradient of 14 mmHg in August.   Again I do not think this is likely the cause of her multiple somatic complaints.  She does have diffuse arthritis and neuropathy and is on medication for that.  She may very well need pain management.  12/25/2018  [name] is seen today in follow-up.  She is done fairly well over the past 6 months.  Her echo last year showed stable LVEF 50 to 55% with mild to moderate aortic stenosis.  She still has complaints of generalized pain.  She denies any congestive heart failure symptoms.  She does note that she goes in and out of A. fib however is not worsened with her symptoms regarding that.  Her INRs have been therapeutic on warfarin.  She is overdue for lipid  profile.  Blood pressure also has been poorly controlled at home in general [id number] systolic.  [id number]  [name] is seen today in follow-up.  Unfortunately she had a stroke in September.  She is now back in A. fib.  She was converted from warfarin to Eliquis.  Since then she has been significantly fatigued with no energy.  She still remains borderline underweight with a BMI of 18 at 102 pounds.  She was seen by Kerin Ransom, PA-C, who noted she was still in persistent A. fib but rate controlled.  He adjusted her medicines further.  Today her heart rhythm is irregular on exam.  Heart rate in the 50s.  I suspect this is persistent A. fib.  She has now been on anticoagulation for more than 3 weeks, actually more than 2 months, and we discussed the possibility of an elective cardioversion.  08/14/2019  [name] returns today for follow-up.  Recently she was seen in the office by Levell July, NP, who was newly hired.  She had recently underwent cardioversion by myself but then had complained of some chest pain.  In fact prior to her cardioversion she had a significant episode of chest pain which she did not initially elaborate on, associated with pain in the left chest, down her left arm and there was nausea and diaphoresis.  She did successfully  convert to sinus rhythm however reverted back to A. fib within about 2 weeks.  During that time when she was in sinus she does say that she felt better.  On follow-up in the office on January 26, she was noted to be in A. fib and felt to be having symptoms of unstable angina.  There was discussion saying that I had recommended the pacemaker for her A. fib, but I had never made that recommendation.  In fact there is a misconception that a pacemaker regulates atrial fibrillation but I cleared this up for her.  It was recommended that she start isosorbide dinitrate twice daily.  Unfortunately, her interaction the office was not pleasant and she never started the isosorbide.  She claims that she reached out to her pharmacy however they said they could not get the medication, probably because this needs prior authorization.  She does have known coronary disease with moderate two-vessel disease by cath in May 2018.  It is possible that she has had recurrent A. fib due to ischemia.  EKG today shows A. fib at 82 without obvious ischemic changes.  PMHx:  Past Medical History:  Diagnosis Date  . Anemia 2005   Generally microcytic, transfusions in [address], 2012, 02/2015, 05/2015  . Aortic stenosis    Moderate November 2017  . Arthritis   . Broken back 2013   Chronic back pain.   [name] CAD (coronary artery disease)    Cardiac catheterization 2014 - 80% mid RCA and 70% OM managed medically  . CHF (congestive heart failure) ([place])   . Chronic bilateral pleural effusions   . Chronic headache   . Contrast media allergy   . Diastolic heart failure ([place])   . Esophageal cancer ([place]) 05/06/2015   Adenocarcinoma GE junction  . Facial paresthesia   . GERD (gastroesophageal reflux disease)   . H. pylori infection   . H/O iron deficiency    05-06-15 iron infusion ([facility])  . HH (hiatus hernia) 2008   Large with associated erosions  . Hypertension   . Paroxysmal atrial fibrillation ([name])   . PONV (postoperative nausea and  vomiting)   .  Pulmonary emboli ([place]) 2008, 2012  . Stroke [name]) 1982    Past Surgical History:  Procedure Laterality Date  . APPENDECTOMY  1950s  . BACK SURGERY    . CARDIOVERSION N/A 07/08/2019   Procedure: CARDIOVERSION;  Surgeon: Pixie Casino, MD;  Location: [place];  Service: Cardiovascular;  Laterality: N/A;  . CARPAL TUNNEL RELEASE Bilateral 1990s  . [place] SURGERY  2014  . CHOLECYSTECTOMY  1980s   open  . COLONOSCOPY N/A 02/17/2015   Procedure: COLONOSCOPY;  Surgeon: [name], MD;  Location: [facility];  Service: Endoscopy;  Laterality: N/A;  . ESOPHAGOGASTRODUODENOSCOPY N/A 02/16/2015   Procedure: ESOPHAGOGASTRODUODENOSCOPY (EGD);  Surgeon: [name], MD;  Location: [facility];  Service: Endoscopy;  Laterality: N/A;  . ESOPHAGOGASTRODUODENOSCOPY N/A 05/06/2015   Procedure: ESOPHAGOGASTRODUODENOSCOPY (EGD);  Surgeon: [name], MD;  Location: [name];  Service: Endoscopy;  Laterality: N/A;  . EUS N/A 05/20/2015   Procedure: UPPER ENDOSCOPIC ULTRASOUND (EUS) LINEAR;  Surgeon: [name], MD;  Location: [facility];  Service: Endoscopy;  Laterality: N/A;  . exploratory lab  1950s or 1960s  . GIVENS CAPSULE STUDY N/A 05/06/2015   Procedure: GIVENS CAPSULE STUDY;  Surgeon: [name], MD;  Location: [facility] END[facility]COPY;  Service: Endoscopy;  Laterality: N/A;  . KNEE ARTHROSCOPY WITH MEDIAL MENISECTOMY Left 04/18/2018   Procedure: LEFT KNEE ARTHROSCOPY WITH PARTIAL MEDIAL MENISECTOMY AND ANTERIOR CRUCIATE LIGAMENT DEBRIDEMENT;  Surgeon: [name], MD;  Location: [facility];  Service: Orthopedics;  Laterality: Left;  . LEFT HEART CATH AND CORONARY ANGIOGRAPHY N/A 11/08/2016   Procedure: Left Heart Cath and Coronary Angiography;  Surgeon: [name], MD;  Location: [place];  Service: Cardiovascular;  Laterality: N/A;  . lumbar back surgery  2012  . [place] SURGERY  1991  . TONSILLECTOMY    . TONSILLECTOMY AND ADENOIDECTOMY   1960s    FAMHx:  Family History  Problem Relation Age of Onset  . Stroke Mother   . Heart disease Mother   . Emphysema Father   . Ovarian cancer Sister   . Stroke Sister   . Other Child        died at birth    SOCHx:   reports that she has never smoked. She has never used smokeless tobacco. She reports that she does not drink alcohol or use drugs.  ALLERGIES:  Allergies  Allergen Reactions  . Iodinated Diagnostic Agents Anaphylaxis and Other (See Comments)    IPD dye Info given by patient  . Iodine Anaphylaxis  . Ioxaglate Anaphylaxis and Other (See Comments)    Info given by patient  . Red Dye Anaphylaxis  . Lactose Intolerance (Gi)   . Whey Other (See Comments)    Lactose intolerance  . Hydralazine Anxiety and Other (See Comments)    Facial flushing, pt prefers not to use it.   . Milk-Related Compounds Other (See Comments)    Lactose intolerance Can tolerate milk if its cooked into the recipe, just can't drink milk   . Propoxyphene Rash and Anxiety    ROS: Pertinent items noted in HPI and remainder of comprehensive ROS otherwise negative.  HOME MEDS: Current Outpatient Medications  Medication Sig Dispense Refill  . acetaminophen (TYLENOL) 500 MG tablet Take 500-1,000 mg by mouth daily as needed for mild pain or moderate pain.     [name] amLODipine (NORVASC) 5 MG tablet Take 1 tablet (5 mg total) by mouth daily. 90 tablet 3  . atorvastatin (  LIPITOR) 40 MG tablet Take 1 tablet (40 mg total) by mouth daily at 6 PM. (Patient taking differently: Take 40 mg by mouth daily. ) 90 tablet 3  . carvedilol (COREG) 25 MG tablet Take 1 tablet (25 mg total) by mouth 2 (two) times daily with a meal. 180 tablet 1  . ELIQUIS 5 MG TABS tablet TAKE (1) TABLET BY MOUTH TWICE DAILY. 180 tablet 3  . furosemide (LASIX) 40 MG tablet TAKE 1 TABLET BY MOUTH ONCE DAILY. (Patient taking differently: Take 40 mg by mouth daily. ) 90 tablet 0  . isosorbide dinitrate (ISORDIL) 30 MG tablet Take 1  tablet (30 mg total) by mouth 2 (two) times daily. 60 tablet 0  . meclizine (ANTIVERT) 25 MG tablet Take 1 tablet (25 mg total) by mouth as needed for dizziness or nausea. (Patient taking differently: Take 25 mg by mouth 3 (three) times daily as needed for dizziness or nausea. ) 90 tablet 1  . Melatonin 1 MG TABS Take 5 mg by mouth. 5 mg sometimes 10 mg    . mirtazapine (REMERON) 7.5 MG tablet Take 1 tablet (7.5 mg total) by mouth at bedtime. 30 tablet 3  . pantoprazole (PROTONIX) 40 MG tablet TAKE 1 TABLET BY MOUTH TWICE DAILY. (Patient taking differently: Take 40 mg by mouth 2 (two) times daily. ) 180 tablet 1  . Pediatric Multiple Vitamins (FLINTSTONES MULTIVITAMIN PO) Take 1 tablet by mouth every evening.    . temazepam (RESTORIL) 15 MG capsule Take 1 capsule (15 mg total) by mouth at bedtime. 30 capsule 0  . traMADol (ULTRAM) 50 MG tablet TAKE (1) TABLET BY MOUTH EVERY EIGHT HOURS. (Patient taking differently: Take 50 mg by mouth every 8 (eight) hours as needed (pain). ) 90 tablet 0   Current Facility-Administered Medications  Medication Dose Route Frequency Provider Last Rate Last Admin  . cyanocobalamin ((VITAMIN B-12)) injection 1,000 mcg  1,000 mcg Intramuscular Once Eulas Post, MD        LABS/IMAGING: No results found for this or any previous visit (from the past 48 hour(s)). No results found.  VITALS: There were no vitals taken for this visit.  EXAM: General appearance: alert and no distress Neck: no carotid bruit and no JVD Lungs: clear to auscultation bilaterally Heart: regular rate and rhythm, S1, S2 normal and systolic murmur: midsystolic 3/6, crescendo at 2nd right intercostal space Abdomen: soft, non-tender; bowel sounds normal; no masses,  no organomegaly Extremities: normal Pulses: 2+ and symmetric Skin: Skin color, texture, turgor normal. No rashes or lesions Neurologic: Grossly normal Psych: Pleasant  EKG: A. fib at 82-personally  reviewed  ASSESSMENT: 1. Unstable angina - cath showed two-vessel moderate CAD and mild aortic stenosis (10/2016) 2. Recent stroke (affecting bulbar symptoms) 3. Uncontrolled hypertension 4. Paroxysmal atrial fibrillation -on Eliquis, cardioverted in 07/2019, then reverted back to afib 5. History of diastolic congestive heart failure - EF 50-55% on echo 6. Dyslipidemia 7. Moderate aortic stenosis 8. Right hand pain with contracture 9. Recent GI cancer/resected  PLAN: 1.   [name] has been describing symptoms concerning for unstable angina.  She underwent successful cardioversion and maintained sinus for about 2 weeks and then reverted back to A. fib.  It could be that she is ischemic and that is the reason that she reverted back to A. fib.  She did have at least moderate coronary disease in 2 vessels by cath in 2018.  She did not start long-acting nitrate due to the medicine not being  prior authorized.  At this point, I think she needs repeat definitive cardiac catheterization for symptoms of unstable angina.  She may ultimately require PCI.  We will need to hold her Eliquis 2 days prior to the procedure.  She also has history of dye allergy which caused anaphylaxis however in the early 1980s.  I explained to her that dyes are much different today and that she had contrast dye in 2018 with premedication and tolerated it well.  We will again premedicate her for this procedure and advised her to take aspirin on the day of the procedure.  Ultimately if she does require PCI then she will likely be on triple therapy for at least a month after which we can likely discontinue aspirin and remain on Plavix and Eliquis.  She would need at least a month before considering a repeat cardioversion plus or minus the addition of antiarrhythmic therapy.  She is in agreement with this plan.  We discussed the risk, benefits and alternatives cardiac catheterization as well as the need for premedication for contrast dye  allergy and she is agreeable to proceed.  Follow-up with me afterwards.  Pixie Casino, MD, [name], [place] Director of the [facility]  [facility] Diplomate of the American Board of Clinical Lipidology Attending Cardiologist  Direct Dial: [phone]  Fax: [phone]  Website:  [email or web address] 08/14/2019, 9:57 AM

## 2019-08-15 LAB — BASIC METABOLIC PANEL
BUN/Creatinine Ratio: 13 (ref 12–28)
BUN: 14 mg/dL (ref 8–27)
CO2: 26 mmol/L (ref 20–29)
Calcium: 9.3 mg/dL (ref 8.7–10.3)
Chloride: 100 mmol/L (ref 96–106)
Creatinine, Ser: 1.04 mg/dL — ABNORMAL HIGH (ref 0.57–1.00)
GFR calc Af Amer: 59 mL/min/{1.73_m2} — ABNORMAL LOW (ref >59)
GFR calc non Af Amer: 51 mL/min/{1.73_m2} — ABNORMAL LOW (ref >59)
Glucose: 129 mg/dL — ABNORMAL HIGH (ref 65–99)
Potassium: 4.2 mmol/L (ref 3.5–5.2)
Sodium: 144 mmol/L (ref 134–144)

## 2019-08-15 LAB — CBC
Hematocrit: 37.8 % (ref 34.0–46.6)
Hemoglobin: 12.4 g/dL (ref 11.1–15.9)
MCH: 28.8 pg (ref 26.6–33.0)
MCHC: 32.8 g/dL (ref 31.5–35.7)
MCV: 88 fL (ref 79–97)
Platelets: 154 10*3/uL (ref 150–450)
RBC: 4.3 x10E6/uL (ref 3.77–5.28)
RDW: 13 % (ref 11.7–15.4)
WBC: 6 10*3/uL (ref 3.4–10.8)

## 2019-08-18 ENCOUNTER — Telehealth: Payer: Self-pay | Admitting: *Deleted

## 2019-08-18 NOTE — Telephone Encounter (Addendum)
Pt contacted pre-catheterization scheduled at [facility] for: Tuesday August 19, 2019 7:30 AM Verified arrival time and place: [facility]) at: 5:30 AM   No solid food after midnight prior to cath, clear liquids until 5 AM day of procedure. Contrast allergy: yes-13 hour Prednisone and Benadryl Prep: 08/18/19 Prednisone 50 mg 6:30 PM 08/19/19 Prednisone 50 mg 12:30 AM 08/19/19 Prednisone 50 mg and Benadryl 50 mg -just prior to leaving home for hospital. Pt's son will drive her to hospital.  Hold: Eliquis-none 08/27/19 until post procedure. pt states she forgot and took Eliquis AM 08/17/19 but has not taken any since and knows to hold until post procedure. Lasix- day before and day of procedure-GFR 51-pt states she has already taken today.  Except hold medications AM meds can be  taken pre-cath with sip of water including: ASA 81 mg Prednisone 50 mg Benadryl 50 mg  Confirmed patient has responsible adult to drive home post procedure and observe 24 hours after arriving home: yes  Currently, due to Covid-19 pandemic, only one person will be allowed with patient. Must be the same person for patient's entire stay and will be required to wear a mask. They will be asked to wait in the waiting room for the duration of the patient's stay.  Patients are required to wear a mask when they enter the hospital.

## 2019-08-18 NOTE — Addendum Note (Signed)
Addended by: Zebedee Iba on: 08/18/2019 09:09 AM   Modules accepted: Orders

## 2019-08-18 NOTE — Telephone Encounter (Signed)
    COVID-19 Pre-Screening Questions:  . In the past 7 to 10 days have you had a cough,  shortness of breath, headache, congestion, fever (100 or greater) body aches, chills, sore throat, or sudden loss of taste or sense of smell? no . Have you been around anyone with known Covid 19 in 7-10 days? no . Have you been around anyone who is awaiting Covid 19 test results in the past 7 to 10 days? no . Have you been around anyone who has been exposed to Covid 19, or has mentioned symptoms of Covid 19 within the past 7 to 10 days? no I reviewed procedure/mask/visitor instructions, COVID-19 screening questions with patient, she verbalized understanding, thanked me for call.

## 2019-08-19 ENCOUNTER — Ambulatory Visit (HOSPITAL_COMMUNITY)
Admission: RE | Admit: 2019-08-19 | Discharge: 2019-08-19 | Disposition: A | Discharge status: Home / Self Care | Payer: Medicare Other | Attending: Cardiovascular Disease | Admitting: Cardiovascular Disease

## 2019-08-19 ENCOUNTER — Other Ambulatory Visit: Payer: Self-pay

## 2019-08-19 ENCOUNTER — Encounter (HOSPITAL_COMMUNITY)
Admission: RE | Disposition: A | Discharge status: Home / Self Care | Payer: Self-pay | Source: Home / Self Care | Attending: Cardiovascular Disease

## 2019-08-19 DIAGNOSIS — Z8673 Personal history of transient ischemic attack (TIA), and cerebral infarction without residual deficits: Secondary | ICD-10-CM | POA: Insufficient documentation

## 2019-08-19 DIAGNOSIS — I5032 Chronic diastolic (congestive) heart failure: Secondary | ICD-10-CM | POA: Diagnosis not present

## 2019-08-19 DIAGNOSIS — K219 Gastro-esophageal reflux disease without esophagitis: Secondary | ICD-10-CM | POA: Insufficient documentation

## 2019-08-19 DIAGNOSIS — Z888 Allergy status to other drugs, medicaments and biological substances status: Secondary | ICD-10-CM | POA: Diagnosis not present

## 2019-08-19 DIAGNOSIS — Z8501 Personal history of malignant neoplasm of esophagus: Secondary | ICD-10-CM | POA: Diagnosis not present

## 2019-08-19 DIAGNOSIS — I480 Paroxysmal atrial fibrillation: Secondary | ICD-10-CM | POA: Insufficient documentation

## 2019-08-19 DIAGNOSIS — M24541 Contracture, right hand: Secondary | ICD-10-CM | POA: Insufficient documentation

## 2019-08-19 DIAGNOSIS — I200 Unstable angina: Secondary | ICD-10-CM

## 2019-08-19 DIAGNOSIS — Z79899 Other long term (current) drug therapy: Secondary | ICD-10-CM | POA: Insufficient documentation

## 2019-08-19 DIAGNOSIS — I110 Hypertensive heart disease with heart failure: Secondary | ICD-10-CM | POA: Insufficient documentation

## 2019-08-19 DIAGNOSIS — I25110 Atherosclerotic heart disease of native coronary artery with unstable angina pectoris: Secondary | ICD-10-CM | POA: Diagnosis not present

## 2019-08-19 DIAGNOSIS — Z7901 Long term (current) use of anticoagulants: Secondary | ICD-10-CM | POA: Insufficient documentation

## 2019-08-19 DIAGNOSIS — E785 Hyperlipidemia, unspecified: Secondary | ICD-10-CM | POA: Diagnosis not present

## 2019-08-19 DIAGNOSIS — I5030 Unspecified diastolic (congestive) heart failure: Secondary | ICD-10-CM | POA: Diagnosis present

## 2019-08-19 DIAGNOSIS — I350 Nonrheumatic aortic (valve) stenosis: Secondary | ICD-10-CM | POA: Insufficient documentation

## 2019-08-19 DIAGNOSIS — M79641 Pain in right hand: Secondary | ICD-10-CM | POA: Diagnosis not present

## 2019-08-19 DIAGNOSIS — I10 Essential (primary) hypertension: Secondary | ICD-10-CM | POA: Diagnosis present

## 2019-08-19 DIAGNOSIS — Z86711 Personal history of pulmonary embolism: Secondary | ICD-10-CM | POA: Diagnosis not present

## 2019-08-19 HISTORY — PX: LEFT HEART CATH AND CORONARY ANGIOGRAPHY: CATH118249

## 2019-08-19 SURGERY — LEFT HEART CATH AND CORONARY ANGIOGRAPHY
Anesthesia: LOCAL

## 2019-08-19 MED ORDER — SODIUM CHLORIDE 0.9% FLUSH: 3.0000 mL | INTRAVENOUS | Status: DC | PRN

## 2019-08-19 MED ORDER — MIDAZOLAM HCL 2 MG/2ML IJ SOLN
INTRAMUSCULAR | Status: DC | PRN
  Administered 2019-08-19: 1 mg via INTRAVENOUS

## 2019-08-19 MED ORDER — HEPARIN (PORCINE) IN NACL 1000-0.9 UT/500ML-% IV SOLN
INTRAVENOUS | Status: AC
  Filled 2019-08-19: qty 1000

## 2019-08-19 MED ORDER — HEPARIN (PORCINE) IN NACL 1000-0.9 UT/500ML-% IV SOLN
INTRAVENOUS | Status: DC | PRN
  Administered 2019-08-19 (×2): 500 mL

## 2019-08-19 MED ORDER — FENTANYL CITRATE (PF) 100 MCG/2ML IJ SOLN
INTRAMUSCULAR | Status: DC | PRN
  Administered 2019-08-19: 25 ug via INTRAVENOUS

## 2019-08-19 MED ORDER — LABETALOL HCL 5 MG/ML IV SOLN: 10.0000 mg | INTRAVENOUS | Status: DC | PRN

## 2019-08-19 MED ORDER — ASPIRIN 81 MG PO CHEW: 81.0000 mg | CHEWABLE_TABLET | ORAL | Status: DC

## 2019-08-19 MED ORDER — LIDOCAINE HCL (PF) 1 % IJ SOLN
INTRAMUSCULAR | Status: DC | PRN
  Administered 2019-08-19: 15 mL

## 2019-08-19 MED ORDER — LIDOCAINE HCL (PF) 1 % IJ SOLN
INTRAMUSCULAR | Status: AC
  Filled 2019-08-19: qty 30

## 2019-08-19 MED ORDER — FENTANYL CITRATE (PF) 100 MCG/2ML IJ SOLN
INTRAMUSCULAR | Status: AC
  Filled 2019-08-19: qty 2

## 2019-08-19 MED ORDER — SODIUM CHLORIDE 0.9% FLUSH: 3.0000 mL | Freq: Two times a day (BID) | INTRAVENOUS | Status: DC

## 2019-08-19 MED ORDER — IOHEXOL 350 MG/ML SOLN
INTRAVENOUS | Status: DC | PRN
  Administered 2019-08-19: 35 mL via INTRACARDIAC

## 2019-08-19 MED ORDER — APIXABAN 5 MG PO TABS: 5.0000 mg | ORAL_TABLET | Freq: Two times a day (BID) | ORAL | Status: DC

## 2019-08-19 MED ORDER — SODIUM CHLORIDE 0.9 % IV SOLN: INTRAVENOUS | Status: DC

## 2019-08-19 MED ORDER — SODIUM CHLORIDE 0.9 % WEIGHT BASED INFUSION: 1.0000 mL/kg/h | INTRAVENOUS | Status: DC

## 2019-08-19 MED ORDER — ACETAMINOPHEN 325 MG PO TABS: 650.0000 mg | ORAL_TABLET | ORAL | Status: DC | PRN

## 2019-08-19 MED ORDER — SODIUM CHLORIDE 0.9 % IV SOLN: 250.0000 mL | INTRAVENOUS | Status: DC | PRN

## 2019-08-19 MED ORDER — ONDANSETRON HCL 4 MG/2ML IJ SOLN: 4.0000 mg | Freq: Four times a day (QID) | INTRAMUSCULAR | Status: DC | PRN

## 2019-08-19 MED ORDER — MIDAZOLAM HCL 2 MG/2ML IJ SOLN
INTRAMUSCULAR | Status: AC
  Filled 2019-08-19: qty 2

## 2019-08-19 SURGICAL SUPPLY — 9 items
CATH 5FR JL3.5 JR4 ANG PIG MP (CATHETERS) ×1 IMPLANT
CLOSURE MYNX CONTROL 5F (Vascular Products) ×1 IMPLANT
KIT HEART LEFT (KITS) ×2 IMPLANT
PACK CARDIAC CATHETERIZATION (CUSTOM PROCEDURE TRAY) ×2 IMPLANT
SHEATH PINNACLE 5F 10CM (SHEATH) ×1 IMPLANT
SHEATH PROBE COVER 6X72 (BAG) ×1 IMPLANT
TRANSDUCER W/STOPCOCK (MISCELLANEOUS) ×2 IMPLANT
TUBING CIL FLEX 10 FLL-RA (TUBING) ×2 IMPLANT
WIRE EMERALD 3MM-J .035X150CM (WIRE) ×1 IMPLANT

## 2019-08-19 NOTE — Discharge Instructions (Signed)
Angiogram, Care After This sheet gives you information about how to care for yourself after your procedure. Your health care provider may also give you more specific instructions. If you have problems or questions, contact your health care provider. What can I expect after the procedure? After the procedure, it is common to have bruising and tenderness at the catheter insertion area. Follow these instructions at home: Insertion site care  Follow instructions from your health care provider about how to take care of your insertion site. Make sure you: ? Wash your hands with soap and water before you change your bandage (dressing). If soap and water are not available, use hand sanitizer. ? Change your dressing as told by your health care provider. ? Leave stitches (sutures), skin glue, or adhesive strips in place. These skin closures may need to stay in place for 2 weeks or longer. If adhesive strip edges start to loosen and curl up, you may trim the loose edges. Do not remove adhesive strips completely unless your health care provider tells you to do that.  Do not take baths, swim, or use a hot tub until your health care provider approves.  You may shower 24-48 hours after the procedure or as told by your health care provider. ? Gently wash the site with plain soap and water. ? Pat the area dry with a clean towel. ? Do not rub the site. This may cause bleeding.  Do not apply powder or lotion to the site. Keep the site clean and dry.  Check your insertion site every day for signs of infection. Check for: ? Redness, swelling, or pain. ? Fluid or blood. ? Warmth. ? Pus or a bad smell. Activity  Rest as told by your health care provider, usually for 1-2 days.  Do not lift anything that is heavier than 10 lbs. (4.5 kg) or as told by your health care provider.  Do not drive for 24 hours if you were given a medicine to help you relax (sedative).  Do not drive or use heavy machinery while  taking prescription pain medicine. General instructions   Return to your normal activities as told by your health care provider, usually in about a week. Ask your health care provider what activities are safe for you.  If the catheter site starts bleeding, lie flat and put pressure on the site. If the bleeding does not stop, get help right away. This is a medical emergency.  Drink enough fluid to keep your urine clear or pale yellow. This helps flush the contrast dye from your body.  Take over-the-counter and prescription medicines only as told by your health care provider.  Keep all follow-up visits as told by your health care provider. This is important. Contact a health care provider if:  You have a fever or chills.  You have redness, swelling, or pain around your insertion site.  You have fluid or blood coming from your insertion site.  The insertion site feels warm to the touch.  You have pus or a bad smell coming from your insertion site.  You have bruising around the insertion site.  You notice blood collecting in the tissue around the catheter site (hematoma). The hematoma may be painful to the touch. Get help right away if:  You have severe pain at the catheter insertion area.  The catheter insertion area swells very fast.  The catheter insertion area is bleeding, and the bleeding does not stop when you hold steady pressure on the area.    The area near or just beyond the catheter insertion site becomes pale, cool, tingly, or numb. These symptoms may represent a serious problem that is an emergency. Do not wait to see if the symptoms will go away. Get medical help right away. Call your local emergency services (911 in the [place] Do not drive yourself to the hospital. Summary  After the procedure, it is common to have bruising and tenderness at the catheter insertion area.  After the procedure, it is important to rest and drink plenty of fluids.  Do not take baths,  swim, or use a hot tub until your health care provider says it is okay to do so. You may shower 24-48 hours after the procedure or as told by your health care provider.  If the catheter site starts bleeding, lie flat and put pressure on the site. If the bleeding does not stop, get help right away. This is a medical emergency. This information is not intended to replace advice given to you by your health care provider. Make sure you discuss any questions you have with your health care provider. Document Revised: 06/01/2017 Document Reviewed: 05/24/2016 Elsevier Patient Education  2020 [employer]  

## 2019-08-19 NOTE — Interval H&P Note (Signed)
History and Physical Interval Note:  08/19/2019 8:45 AM  [name]  has presented today for surgery, with the diagnosis of Unstable angina.  The various methods of treatment have been discussed with the patient and family. After consideration of risks, benefits and other options for treatment, the patient has consented to  Procedure(s): LEFT HEART CATH AND CORONARY ANGIOGRAPHY (N/A) as a surgical intervention.  The patient's history has been reviewed, patient examined, no change in status, stable for surgery.  I have reviewed the patient's chart and labs.  Questions were answered to the patient's satisfaction.   Cath Lab Visit (complete for each Cath Lab visit)  Clinical Evaluation Leading to the Procedure:   ACS: Yes.    Non-ACS:    Anginal Classification: CCS III  Anti-ischemic medical therapy: Maximal Therapy (2 or more classes of medications)  Non-Invasive Test Results: No non-invasive testing performed  Prior CABG: No previous CABG        Collier [facility] 08/19/2019 8:46 AM

## 2019-08-23 ENCOUNTER — Other Ambulatory Visit: Payer: Self-pay | Admitting: Adult Health

## 2019-08-23 ENCOUNTER — Other Ambulatory Visit: Payer: Self-pay | Admitting: Internal Medicine

## 2019-08-23 DIAGNOSIS — Z760 Encounter for issue of repeat prescription: Secondary | ICD-10-CM

## 2019-08-28 ENCOUNTER — Other Ambulatory Visit: Payer: Self-pay | Admitting: Adult Health

## 2019-08-28 DIAGNOSIS — Z760 Encounter for issue of repeat prescription: Secondary | ICD-10-CM

## 2019-08-28 MED ORDER — TEMAZEPAM 15 MG PO CAPS: ORAL_CAPSULE | ORAL | 0 refills | Status: DC

## 2019-09-12 ENCOUNTER — Ambulatory Visit (INDEPENDENT_AMBULATORY_CARE_PROVIDER_SITE_OTHER): Payer: Medicare Other | Admitting: Internal Medicine

## 2019-09-12 ENCOUNTER — Encounter: Payer: Self-pay | Admitting: Internal Medicine

## 2019-09-12 ENCOUNTER — Other Ambulatory Visit: Payer: Self-pay

## 2019-09-12 VITALS — BP 130/60 | HR 67 | Temp 96.4°F | Ht 62.0 in | Wt 98.2 lb

## 2019-09-12 DIAGNOSIS — I2510 Atherosclerotic heart disease of native coronary artery without angina pectoris: Secondary | ICD-10-CM

## 2019-09-12 DIAGNOSIS — R5383 Other fatigue: Secondary | ICD-10-CM

## 2019-09-12 DIAGNOSIS — I4891 Unspecified atrial fibrillation: Secondary | ICD-10-CM

## 2019-09-12 DIAGNOSIS — I200 Unstable angina: Secondary | ICD-10-CM

## 2019-09-12 DIAGNOSIS — I10 Essential (primary) hypertension: Secondary | ICD-10-CM | POA: Diagnosis not present

## 2019-09-12 NOTE — Patient Instructions (Signed)
Medication Instructions:  Your physician recommends that you continue on your current medications as directed. Please refer to the Current Medication list given to you today.  *If you need a refill on your cardiac medications before your next appointment, please call your pharmacy*   Follow-Up: At [facility], you and your health needs are our priority.  As part of our continuing mission to provide you with exceptional heart care, we have created designated Provider Care Teams.  These Care Teams include your primary Cardiologist (physician) and Advanced Practice Providers (APPs -  Physician Assistants and Nurse Practitioners) who all work together to provide you with the care you need, when you need it.  We recommend signing up for the patient portal called "MyChart".  Sign up information is provided on this After Visit Summary.  MyChart is used to connect with patients for Virtual Visits (Telemedicine).  Patients are able to view lab/test results, encounter notes, upcoming appointments, etc.  Non-urgent messages can be sent to your provider as well.   To learn more about what you can do with MyChart, go to [email or web address].    Your next appointment:   3 month(s) w/EKG  The format for your next appointment:   In Person  Provider:   K. Mali Hilty, MD   Other Instructions

## 2019-09-12 NOTE — Progress Notes (Signed)
OFFICE NOTE  Chief Complaint:  Follow-up cath  Primary Care Physician: Miranda Peng, NP  HPI:  [name] is a pleasant 79 year old female kindly referred to me be Dr. Ardeth [name]. She reports a past medical history significant for atrial fibrillation and congestive heart failure, which occurred around the same time and probably 2012. She vaguely recalls seeing Dr. Irish [name] at that time.  She was started on warfarin therapy and no attempts were made to get her back into rhythm as she was paroxysmal. She does has a long-standing history of hypertension and dyslipidemia. She apparently developed heart failure or however has never had reassessment of her ejection fraction. She subsequently was having health issues and decided to move with her son to live in [place]. She says at that time she had some problems with chest pain and ultimately underwent a stress test. This was associated with an anaphylactic type reaction and despite their reassurances she will "no longer have any more stress test". She says she has a severe allergy to IV contrast dye which causes throat swelling as well as milk which also causes the same symptoms. Apparently she also underwent cardiac catheterization however she says that only medical therapy was recommended. This was in [place], [place].  We are trying to request records from there as well. She subsequently moved with her son to [facility], however he lost [name] job and she recently moved back to [place]. Currently she denies any significant shortness of breath but does report some mild leg edema which is slightly worse and occasional pain in her left chest and left arm over the last several days. She's also had pain in the right chest. None of those symptoms are worse with exertion or relieved by rest or medication.   [name] returns today for followup. She again appears upset and quite nervous. Blood pressure recently has been significantly elevated. In fact  she went to the emergency room and was hospitalized for blood pressure control. Although it seems that her blood pressure more easily was controlled once her oral medicines were restarted. Subsequently she's been started on clonidine is now up to twice a day. Blood pressure today was still high at [id number] systolic. She reports some occasional chest discomfort, mostly at rest and reports not being active enough to know if she has symptoms with exertion. She also has pain all over body significant low back pain and is seeing a specialist about that in the next few weeks.  I saw [name] back in the office today. She reports some improvement in her chest discomfort and I think this is related to blood pressure. The recent switch in her blood pressure medicines indicate a marked improvement in her pressures in she is noted to be 130/66 today. She does get some slight shortness of breath with exertion and no regular chest discomfort. At this time there is not clear evidence to pursue a further workup although I have a low threshold for further cardiac workup if she should become more symptomatic. She is in sinus rhythm and seems to be maintaining that.  [name] returns today for follow-up. She's had a remarkable past 6 months. Unfortunately she was diagnosed with a gastric cancer which was considered very high risk to resect. She was referred to [facility] for an endoscopic resection which was successful with clean margins. Prior to that she underwent cardiac evaluation by [facility] cardiology including a nuclear stress test which was low risk and didn't involve  exercise. She reports that during the last minute of exercise she went into A. fib. She's had recurrent A. fib on and off and in fact was just hospitalized again for A. fib a few weeks ago which converted spontaneously back to sinus rhythm, prior to cardiology is evaluating her. She was then directed to follow-up with me. She has fortunately been on warfarin  with therapeutic INRs. Of note, I did review her cardiac catheterization which was performed at an outside hospital 2014 which did demonstrate an 80% mid RCA stenosis as well as a 70% OM lesion and some mild to moderate disease of other vessels. At the time no stent was recommended, which leads me to believe these may be smaller vessels. The fact that her most recent stress test was again negative suggest that they are not flow limiting even though they would seem significant. Also, today she is describing left hand pain. She says that there is exquisite tenderness the left hand and it's difficult for her to extend her finger. She often notes that her left middle finger gets stuck. On palpation I do not notice any significant arthritis of the joints or nodules on the tendons.  04/19/2016  [name] returns today for follow-up. She is concerned about shortness of breath which is been progressive. She reports recently she's had some loss of vocal power and noted some upper airway wheezing. We and related with her on the office today and her oxygen saturations were noted to be 97% at rest and that came down to 95% with ambulation. EKG shows sinus rhythm at 66. Blood pressure is mildly elevated today. She has a nonproductive cough but denies fever, chills or sweats. Weight is up to 141 from 130 pounds 6 months ago.  07/11/2015  [name] returns today for follow-up. She seems to be doing fairly well. She denies any worsening shortness of breath or chest pain. She seems to be suffering from some acid reflux and will likely be undergoing another EGD with [name]. She continues to have problems with voice changes which could be due to vocal cord inflammation related to her reflux.  09/21/2016  [name] is here today for follow-up. She had an INR check in the office. She is scheduled to have upcoming surgery for large hiatal hernia. Warm to [facility] by [name]. Risk factors for surgery including  aortic stenosis, however this is recently assessed and is moderate. She denies any chest pain or anginal symptoms.  11/02/2016  [name] returns for follow-up today. Recently she's been having uncontrolled hypertension. 5 blood pressures been over [id number] systolic. She's not had a history with difficult to control hypertension in the past. He's also had associated chest discomfort. The other day she had severe chest discomfort and she has had headaches. In 2013 she underwent cardia catheterization in [place] which showed some moderate coronary disease including what appears to be an 80% stenosis of the RCA however was not treated at the time. She also had proximal LAD disease which was mild to moderate. I'm concerned that she's had progression of her coronary artery disease as a possible cause of her uncontrolled hypertension. She reports the pain has occurred at rest but can occur with exertion. She also gets short of breath with exertion. She's felt more fatigue as well. The pain is described as sharp and nonradiating in the central chest. Unfortunate she's on warfarin for PAF and will need to hold that for 5 days prior to the  procedure to safely undergo cardiac catheterization. Although he recently cleared her for hernia repair, she never underwent the surgery as she was felt to be at higher risk. Given this new information I would not recommend hernia surgery in the near future until we have a better idea of her coronary arteries.  11/16/2016  [name] returns for follow-up. She underwent heart catheterization by Dr. Ellyn Hack on 11/08/2016 which showed hyperdynamic LVEF greater than 65%, there was a moderate 60% ramus lesion and 60% RPDA lesion which was not thought to be significant. There was mild aortic valvular stenosis. Blood pressure was elevated. Medical therapy including increased blood pressure control was recommended. Since discharge she has done well. She denied any competitions from the right  femoral access site. She's had one or 2 brief episodes of chest discomfort. Unfortunately after I saw her in the office at the last visit she fell in the parking lot by the [employer] caf[employer]ia. Ultimately she fractured her right radial styloid. That is now casted and in a sling. She does not anticipate the need for surgery, but she would be considered acceptable risk for surgery. Blood pressure is improved however not yet at goal.  02/19/2017  [name] is seen today in follow-up. She is doing well other than painful osteoarthritis. Her blood pressures been better controlled although she is in need of refills. She was found to have moderate aortic stenosis by last echo however was only noted to be mild by cardiac catheterization. She'll need a repeat echo in about 6 months.  09/10/2016  [name] returns today for follow-up.  She is under a lot of stress today.  Her sister is in the hospital and had a repeat heart surgery.  [name] blood pressure is much better controlled today 114/60.  Overall she feels well and denies chest pain or worsening shortness of breath.  As noted she did have moderate coronary artery disease and even some severe distal coronary disease managed medically by heart cath in 2014.  Recently she had a lipid profile last week showing total cholesterol 182, triglycerides 109, HDL 54, LDL 107.  She is on simvastatin 20 mg.  She does have moderate aortic stenosis by echo in 05/2016.  03/20/2018  [name] is seen today in follow-up.  She has multiple complaints today including significant chest and back pain, weakness, fatigue.  Work-up recently including hospitalization demonstrated some acute heart failure with LVEF 50 to 55%.  She has permanent A. fib at this point and is not likely that that is the main cause of her weakness.  She was diuresed and is also noted to have mild to moderate aortic stenosis with a mean gradient of 14 mmHg in August.  Again I do not think this is  likely the cause of her multiple somatic complaints.  She does have diffuse arthritis and neuropathy and is on medication for that.  She may very well need pain management.  12/25/2018  [name] is seen today in follow-up.  She is done fairly well over the past 6 months.  Her echo last year showed stable LVEF 50 to 55% with mild to moderate aortic stenosis.  She still has complaints of generalized pain.  She denies any congestive heart failure symptoms.  She does note that she goes in and out of A. fib however is not worsened with her symptoms regarding that.  Her INRs have been therapeutic on warfarin.  She is overdue for lipid profile.  Blood pressure also has been poorly controlled at home in general [id number] systolic.  [id number]  [name] is seen today in follow-up.  Unfortunately she had a stroke in September.  She is now back in A. fib.  She was converted from warfarin to Eliquis.  Since then she has been significantly fatigued with no energy.  She still remains borderline underweight with a BMI of 18 at 102 pounds.  She was seen by Kerin Ransom, PA-C, who noted she was still in persistent A. fib but rate controlled.  He adjusted her medicines further.  Today her heart rhythm is irregular on exam.  Heart rate in the 50s.  I suspect this is persistent A. fib.  She has now been on anticoagulation for more than 3 weeks, actually more than 2 months, and we discussed the possibility of an elective cardioversion.  08/14/2019  [name] returns today for follow-up.  Recently she was seen in the office by Levell July, NP, who was newly hired.  She had recently underwent cardioversion by myself but then had complained of some chest pain.  In fact prior to her cardioversion she had a significant episode of chest pain which she did not initially elaborate on, associated with pain in the left chest, down her left arm and there was nausea and diaphoresis.  She did successfully convert to sinus rhythm however  reverted back to A. fib within about 2 weeks.  During that time when she was in sinus she does say that she felt better.  On follow-up in the office on January 26, she was noted to be in A. fib and felt to be having symptoms of unstable angina.  There was discussion saying that I had recommended the pacemaker for her A. fib, but I had never made that recommendation.  In fact there is a misconception that a pacemaker regulates atrial fibrillation but I cleared this up for her.  It was recommended that she start isosorbide dinitrate twice daily.  Unfortunately, her interaction the office was not pleasant and she never started the isosorbide.  She claims that she reached out to her pharmacy however they said they could not get the medication, probably because this needs prior authorization.  She does have known coronary disease with moderate two-vessel disease by cath in May 2018.  It is possible that she has had recurrent A. fib due to ischemia.  EKG today shows A. fib at 82 without obvious ischemic changes.  09/12/2019  [name] is seen today for follow-up.  She underwent left heart cath on 08/19/2019 and was found to have moderate two-vessel coronary disease with a 70% stenosis in the ramus branch and 60% mid PDA stenosis.  Neither was significant or significantly changed compared to prior cath in 2018.  Medical therapy was recommended.  She still says she feels poorly.  She seems to have a fairly flat affect and may be depressed.  According to family she has lost significant weight.  She is lost another 5 pounds over the past few weeks.  She said food does not taste good but her appetite is poor.  PMHx:  Past Medical History:  Diagnosis Date  . Anemia 2005   Generally microcytic, transfusions in [address], 2012, 02/2015, 05/2015  . Aortic stenosis    Moderate November 2017  . Arthritis   . Broken back 2013   Chronic back pain.   [name] CAD (coronary artery disease)    Cardiac catheterization 2014 - 80% mid  RCA  and 70% OM managed medically  . CHF (congestive heart failure) ([place])   . Chronic bilateral pleural effusions   . Chronic headache   . Contrast media allergy   . Diastolic heart failure ([place])   . Esophageal cancer ([place]) 05/06/2015   Adenocarcinoma GE junction  . Facial paresthesia   . GERD (gastroesophageal reflux disease)   . H. pylori infection   . H/O iron deficiency    05-06-15 iron infusion ([facility])  . HH (hiatus hernia) 2008   Large with associated erosions  . Hypertension   . Paroxysmal atrial fibrillation ([name])   . PONV (postoperative nausea and vomiting)   . Pulmonary emboli ([place]) 2008, 2012  . Stroke [facility]) 1982    Past Surgical History:  Procedure Laterality Date  . APPENDECTOMY  1950s  . BACK SURGERY    . CARDIOVERSION N/A 07/08/2019   Procedure: CARDIOVERSION;  Surgeon: Pixie Casino, MD;  Location: [facility];  Service: Cardiovascular;  Laterality: N/A;  . CARPAL TUNNEL RELEASE Bilateral 1990s  . [place] SURGERY  2014  . CHOLECYSTECTOMY  1980s   open  . COLONOSCOPY N/A 02/17/2015   Procedure: COLONOSCOPY;  Surgeon: [name], MD;  Location: [facility] ENDOSCOPY;  Service: Endoscopy;  Laterality: N/A;  . ESOPHAGOGASTRODUODENOSCOPY N/A 02/16/2015   Procedure: ESOPHAGOGASTRODUODENOSCOPY (EGD);  Surgeon: [name], MD;  Location: [facility];  Service: Endoscopy;  Laterality: N/A;  . ESOPHAGOGASTRODUODENOSCOPY N/A 05/06/2015   Procedure: ESOPHAGOGASTRODUODENOSCOPY (EGD);  Surgeon: [name], MD;  Location: [facility] ENDOSCOPY;  Service: Endoscopy;  Laterality: N/A;  . EUS N/A 05/20/2015   Procedure: UPPER ENDOSCOPIC ULTRASOUND (EUS) LINEAR;  Surgeon: [name], MD;  Location: [facility] ENDOSCOPY;  Service: Endoscopy;  Laterality: N/A;  . exploratory lab  1950s or 1960s  . GIVENS CAPSULE STUDY N/A 05/06/2015   Procedure: GIVENS CAPSULE STUDY;  Surgeon: [name], MD;  Location: [facility] ENDOSCOPY;  Service: Endoscopy;  Laterality: N/A;  . KNEE ARTHROSCOPY WITH  MEDIAL MENISECTOMY Left 04/18/2018   Procedure: LEFT KNEE ARTHROSCOPY WITH PARTIAL MEDIAL MENISECTOMY AND ANTERIOR CRUCIATE LIGAMENT DEBRIDEMENT;  Surgeon: [name], MD;  Location: [facility];  Service: Orthopedics;  Laterality: Left;  . LEFT HEART CATH AND CORONARY ANGIOGRAPHY N/A 11/08/2016   Procedure: Left Heart Cath and Coronary Angiography;  Surgeon: [name], MD;  Location: [facility] CV LAB;  Service: Cardiovascular;  Laterality: N/A;  . LEFT HEART CATH AND CORONARY ANGIOGRAPHY N/A 08/19/2019   Procedure: LEFT HEART CATH AND CORONARY ANGIOGRAPHY;  Surgeon: Martinique, Peter M, MD;  Location: [facility] CV LAB;  Service: Cardiovascular;  Laterality: N/A;  . lumbar back surgery  2012  . [place] SURGERY  1991  . TONSILLECTOMY    . TONSILLECTOMY AND ADENOIDECTOMY  1960s    FAMHx:  Family History  Problem Relation Age of Onset  . Stroke Mother   . Heart disease Mother   . Emphysema Father   . Ovarian cancer Sister   . Stroke Sister   . Other Child        died at birth    SOCHx:   reports that she has never smoked. She has never used smokeless tobacco. She reports that she does not drink alcohol or use drugs.  ALLERGIES:  Allergies  Allergen Reactions  . Iodinated Diagnostic Agents Anaphylaxis and Other (See Comments)    IPD dye Info given by patient  . Iodine Anaphylaxis  . Ioxaglate Anaphylaxis and Other (See Comments)  . Lactose Intolerance (Gi)  Anaphylaxis  . Red Dye Anaphylaxis  . Hydralazine Anxiety and Other (See Comments)    Facial flushing, pt prefers not to use it.   . Milk-Related Compounds Other (See Comments)    Lactose intolerance Can tolerate milk if its cooked into the recipe, just can't drink milk   . Propoxyphene Rash and Anxiety    ROS: Pertinent items noted in HPI and remainder of comprehensive ROS otherwise negative.  HOME MEDS: Current Outpatient Medications  Medication Sig Dispense Refill  . acetaminophen (TYLENOL) 500 MG  tablet Take 500 mg by mouth daily as needed for mild pain or moderate pain.     [name] amLODipine (NORVASC) 5 MG tablet Take 1 tablet (5 mg total) by mouth daily. 90 tablet 0  . apixaban (ELIQUIS) 5 MG TABS tablet Take 1 tablet (5 mg total) by mouth 2 (two) times daily.    [name] atorvastatin (LIPITOR) 40 MG tablet Take 1 tablet (40 mg total) by mouth daily at 6 PM. 90 tablet 3  . furosemide (LASIX) 40 MG tablet Take 1 tablet (40 mg total) by mouth daily. 90 tablet 0  . meclizine (ANTIVERT) 25 MG tablet Take 1 tablet (25 mg total) by mouth as needed for dizziness or nausea. (Patient taking differently: Take 25 mg by mouth 3 (three) times daily as needed for dizziness or nausea. ) 90 tablet 1  . Melatonin 5 MG CAPS Take 10 mg by mouth at bedtime.    . pantoprazole (PROTONIX) 40 MG tablet TAKE 1 TABLET BY MOUTH TWICE DAILY. 180 tablet 0  . Pediatric Multiple Vitamins (FLINTSTONES MULTIVITAMIN PO) Take 1 tablet by mouth every evening.    . predniSONE (DELTASONE) 50 MG tablet Take 1 tablet by mouth 13 hours and 7 hours prior to procedure and last dose when leaving home (with Benadryl 50mg ) (Patient taking differently: Take 50 mg by mouth See admin instructions. Take 50 mg by mouth 13 hours and 7 hours prior to procedure and last dose when leaving home (with Benadryl 50mg )) 3 tablet 0  . temazepam (RESTORIL) 15 MG capsule TAKE (1) CAPSULE BY MOUTH AT BEDTIME. 30 capsule 0  . traMADol (ULTRAM) 50 MG tablet TAKE (1) TABLET BY MOUTH EVERY EIGHT HOURS. (Patient taking differently: Take 50 mg by mouth every 8 (eight) hours as needed (pain). ) 90 tablet 0  . carvedilol (COREG) 25 MG tablet Take 1 tablet (25 mg total) by mouth 2 (two) times daily with a meal. 180 tablet 1   No current facility-administered medications for this visit.    LABS/IMAGING: No results found for this or any previous visit (from the past 48 hour(s)). No results found.  VITALS: BP 130/60   Pulse 67   Temp (!) 96.4 F (35.8 C)   Ht 5\' 2"   (1.575 m)   Wt 98 lb 3.2 oz (44.5 kg)   SpO2 97%   BMI 17.96 kg/m   EXAM: General appearance: alert and no distress Neck: no carotid bruit and no JVD Lungs: clear to auscultation bilaterally Heart: regular rate and rhythm, S1, S2 normal and systolic murmur: midsystolic 3/6, crescendo at 2nd right intercostal space Abdomen: soft, non-tender; bowel sounds normal; no masses,  no organomegaly Extremities: normal Pulses: 2+ and symmetric Skin: Skin color, texture, turgor normal. No rashes or lesions Neurologic: Grossly normal Psych: Pleasant  EKG: Deferred  ASSESSMENT: 1. Unstable angina - cath showed two-vessel moderate CAD and mild aortic stenosis (10/2016), repeat cath 08/2019 unchanged 2. Recent stroke (affecting bulbar symptoms) 3. Uncontrolled  hypertension 4. Paroxysmal atrial fibrillation -on Eliquis, cardioverted in 07/2019, then reverted back to afib 5. History of diastolic congestive heart failure - EF 50-55% on echo 6. Dyslipidemia 7. Moderate aortic stenosis 8. Right hand pain with contracture 9. Recent GI cancer/resected 10. Weight loss  PLAN: 1.   [name] continues to have progressive weakness, weight loss and fatigue.  She does have a history of a GI cancer which was resected.  I hope that she has not had recurrence of this but will defer to her PCP.  Her cath showed stable findings therefore I do not think this is coronary disease.  It may very well be her A. fib is causing her to be fatigued however it is hard to tell if she felt any different when she was briefly back in rhythm.  She would need to be anticoagulated longer before considering repeat cardioversion.  I would recommend we reassess her cardiac rhythm in about 3 months.  In the meantime I would defer to her PCP to continue to work-up her fatigue and weight loss.  Pixie Casino, MD, [name], [place] Director of the Advanced Lipid Disorders &  Cardiovascular Risk  Reduction Clinic Diplomate of the American Board of Clinical Lipidology Attending Cardiologist  Direct Dial: [phone]  Fax: [phone]  Website:  [email or web address] 09/12/2019, 3:33 PM

## 2019-09-13 ENCOUNTER — Encounter: Payer: Self-pay | Admitting: Internal Medicine

## 2019-09-17 ENCOUNTER — Other Ambulatory Visit: Payer: Self-pay | Admitting: Adult Health

## 2019-09-23 ENCOUNTER — Other Ambulatory Visit: Payer: Self-pay | Admitting: Adult Health

## 2019-09-23 DIAGNOSIS — Z760 Encounter for issue of repeat prescription: Secondary | ICD-10-CM

## 2019-09-24 ENCOUNTER — Other Ambulatory Visit: Payer: Self-pay

## 2019-09-25 ENCOUNTER — Encounter: Payer: Self-pay | Admitting: Internal Medicine

## 2019-09-25 ENCOUNTER — Ambulatory Visit (INDEPENDENT_AMBULATORY_CARE_PROVIDER_SITE_OTHER): Payer: Medicare Other | Admitting: Adult Health

## 2019-09-25 ENCOUNTER — Encounter: Payer: Self-pay | Admitting: Adult Health

## 2019-09-25 VITALS — BP 130/76 | Temp 98.1°F | Wt 100.2 lb

## 2019-09-25 DIAGNOSIS — R634 Abnormal weight loss: Secondary | ICD-10-CM | POA: Diagnosis not present

## 2019-09-25 DIAGNOSIS — R5382 Chronic fatigue, unspecified: Secondary | ICD-10-CM

## 2019-09-25 DIAGNOSIS — I200 Unstable angina: Secondary | ICD-10-CM | POA: Diagnosis not present

## 2019-09-25 DIAGNOSIS — R531 Weakness: Secondary | ICD-10-CM

## 2019-09-25 MED ORDER — BUPROPION HCL ER (XL) 150 MG PO TB24: 150.0000 mg | ORAL_TABLET | Freq: Every day | ORAL | 0 refills | Status: DC

## 2019-09-25 NOTE — Patient Instructions (Addendum)
It was great seeing you today   I am going to start you on Wellbutrin to help with depression   Please follow up with me in one month

## 2019-09-25 NOTE — Progress Notes (Signed)
Subjective:    Patient ID: [name], female    DOB: Feb 05, 1941, 79 y.o.   MRN: [id number]  HPI 79 year old female who  has a past medical history of Anemia (2005), Aortic stenosis, Arthritis, Broken back (2013), CAD (coronary artery disease), CHF (congestive heart failure) ([place]), Chronic bilateral pleural effusions, Chronic headache, Contrast media allergy, Diastolic heart failure ([place]), Esophageal cancer ([place]) (05/06/2015), Facial paresthesia, GERD (gastroesophageal reflux disease), H. pylori infection, H/O iron deficiency, HH (hiatus hernia) (2008), Hypertension, Paroxysmal atrial fibrillation ([place]), PONV (postoperative nausea and vomiting), Pulmonary emboli ([place]) (2008, 2012), and Stroke [facility]) (1982).   She was recently seen by Cardiology due to history of paroxysmal atrial fibrillation, diastolic congestive heart failure, moderate aortic stenosis, and recent stroke.  During this visit it was noted that she continued to have progressive weakness, weight loss, fatigue.  She was advised to follow-up with her PCP and has her symptoms were not thought to be of cardiac origin.  She does have a history of gastric cancer s/p resection  Today she reports that she does have an appetite and food tastes good but she just does not eat a lot throughout the day and is continuing to have issues with dysphagia consider stroke.  She has completed treatment with speech therapy.  She denies nausea or vomiting and does not have any abdominal pain.  When asked about depression she does endorse having depression which is related to not being able to drive, not getting outside, and family issues with her son and 2 grandsons who are living with her, and her youngest sister recently passed away.  When discussing all this in the office she becomes tearful.  She is not enjoying the life as she once had and believes that this is taking a toll on her.  Her weight is dropped into the lowest of 97 pounds in January 2021.   Today in the office she is 100 pounds.    Review of Systems See HPI   Past Medical History:  Diagnosis Date  . Anemia 2005   Generally microcytic, transfusions in [address], 2012, 02/2015, 05/2015  . Aortic stenosis    Moderate November 2017  . Arthritis   . Broken back 2013   Chronic back pain.   [name] CAD (coronary artery disease)    Cardiac catheterization 2014 - 80% mid RCA and 70% OM managed medically  . CHF (congestive heart failure) ([place])   . Chronic bilateral pleural effusions   . Chronic headache   . Contrast media allergy   . Diastolic heart failure ([place])   . Esophageal cancer ([place]) 05/06/2015   Adenocarcinoma GE junction  . Facial paresthesia   . GERD (gastroesophageal reflux disease)   . H. pylori infection   . H/O iron deficiency    05-06-15 iron infusion ([facility])  . HH (hiatus hernia) 2008   Large with associated erosions  . Hypertension   . Paroxysmal atrial fibrillation ([name])   . PONV (postoperative nausea and vomiting)   . Pulmonary emboli ([place]) 2008, 2012  . Stroke [facility]) 1982    Social History   Socioeconomic History  . Marital status: Widowed    Spouse name: Not on file  . Number of children: 3  . Years of education: 75  . Highest education level: Not on file  Occupational History  . Occupation: Retired  Tobacco Use  . Smoking status: Never Smoker  . Smokeless tobacco: Never Used  Substance and Sexual Activity  . Alcohol use:  No    Alcohol/week: 0.0 standard drinks  . Drug use: No  . Sexual activity: Not Currently  Other Topics Concern  . Not on file  Social History Narrative   Born and raised in [place], [place]. Currently resides in a house with her son. 1 dog. Fun: go to church   Divorced(Has total of #3 children)-[place], [place], [place] Jun[place]   Denies religious beliefs that would effect health care.    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[employer] Strain: Medium Risk  . Difficulty of Paying Living Expenses: Somewhat hard  Food Insecurity: No Food Insecurity  . Worried About Charity fundraiser in the Last Year: Never true  . Ran Out of Food in the Last Year: Never true  Transportation Needs: No Transportation Needs  . Lack of Transportation (Medical): No  . Lack of Transportation (Non-Medical): No  Physical Activity: Inactive  . Days of Exercise per Week: 0 days  . Minutes of Exercise per Session: 0 min  Stress: No Stress Concern Present  . Feeling of Stress : Not at all  Social Connections: Unknown  . Frequency of Communication with Friends and Family: Not on file  . Frequency of Social Gatherings with Friends and Family: Not on file  . Attends Religious Services: Not on file  . Active Member of Clubs or Organizations: Not on file  . Attends Archivist Meetings: Not on file  . Marital Status: Widowed  Intimate Partner Violence:   . Fear of Current or Ex-Partner:   . Emotionally Abused:   [name] Physically Abused:   . Sexually Abused:     Past Surgical History:  Procedure Laterality Date  . APPENDECTOMY  1950s  . BACK SURGERY    . CARDIOVERSION N/A 07/08/2019   Procedure: CARDIOVERSION;  Surgeon: Pixie Casino, MD;  Location: [place];  Service: Cardiovascular;  Laterality: N/A;  . CARPAL TUNNEL RELEASE Bilateral 1990s  . [place] SURGERY  2014  . CHOLECYSTECTOMY  1980s   open  . COLONOSCOPY N/A 02/17/2015   Procedure: COLONOSCOPY;  Surgeon: [name], MD;  [facility];  Service: Endoscopy;  Laterality: N/A;  . ESOPHAGOGASTRODUODENOSCOPY N/A 02/16/2015   Procedure: ESOPHAGOGASTRODUODENOSCOPY (EGD);  Surgeon: [name], MD;  [facility];  Service: Endoscopy;  Laterality: N/A;  . ESOPHAGOGASTRODUODENOSCOPY N/A 05/06/2015   Procedure: ESOPHAGOGASTRODUODENOSCOPY (EGD);  Surgeon: [name], MD;  Location: [facility] ENDOSCOPY;  Service: Endoscopy;  Laterality:  N/A;  . EUS N/A 05/20/2015   Procedure: UPPER ENDOSCOPIC ULTRASOUND (EUS) LINEAR;  Surgeon: [name], MD;  [facility];  Service: Endoscopy;  Laterality: N/A;  . exploratory lab  1950s or 1960s  . GIVENS CAPSULE STUDY N/A 05/06/2015   Procedure: GIVENS CAPSULE STUDY;  Surgeon: [name], MD;  Location: [facility] ENDOSCOPY;  Service: Endoscopy;  Laterality: N/A;  . KNEE ARTHROSCOPY WITH MEDIAL MENISECTOMY Left 04/18/2018   Procedure: LEFT KNEE ARTHROSCOPY WITH PARTIAL MEDIAL MENISECTOMY AND ANTERIOR CRUCIATE LIGAMENT DEBRIDEMENT;  Surgeon: [name], MD;  [facility];  Service: Orthopedics;  Laterality: Left;  . LEFT HEART CATH AND CORONARY ANGIOGRAPHY N/A 11/08/2016   Procedure: Left Heart Cath and Coronary Angiography;  Surgeon: [name], MD;  Location: [place] [facility];  Service: Cardiovascular;  Laterality: N/A;  . LEFT HEART CATH AND CORONARY ANGIOGRAPHY N/A 08/19/2019   Procedure: LEFT HEART  CATH AND CORONARY ANGIOGRAPHY;  Surgeon: Martinique, Peter M, MD;  Location: [place] [facility];  Service: Cardiovascular;  Laterality: N/A;  . lumbar back surgery  2012  . [place] SURGERY  1991  . TONSILLECTOMY    . TONSILLECTOMY AND ADENOIDECTOMY  1960s    Family History  Problem Relation Age of Onset  . Stroke Mother   . Heart disease Mother   . Emphysema Father   . Ovarian cancer Sister   . Stroke Sister   . Other Child        died at birth    Allergies  Allergen Reactions  . Iodinated Diagnostic Agents Anaphylaxis and Other (See Comments)    IPD dye Info given by patient  . Iodine Anaphylaxis  . Ioxaglate Anaphylaxis and Other (See Comments)  . Lactose Intolerance (Gi) Anaphylaxis  . Red Dye Anaphylaxis  . Hydralazine Anxiety and Other (See Comments)    Facial flushing, pt prefers not to use it.   . Milk-Related Compounds Other (See Comments)    Lactose intolerance Can tolerate milk if its cooked into the recipe, just can't drink milk   .  Propoxyphene Rash and Anxiety    Current Outpatient Medications on File Prior to Visit  Medication Sig Dispense Refill  . acetaminophen (TYLENOL) 500 MG tablet Take 500 mg by mouth daily as needed for mild pain or moderate pain.     [name] amLODipine (NORVASC) 5 MG tablet Take 1 tablet (5 mg total) by mouth daily. 90 tablet 0  . apixaban (ELIQUIS) 5 MG TABS tablet Take 1 tablet (5 mg total) by mouth 2 (two) times daily.    [name] atorvastatin (LIPITOR) 40 MG tablet Take 1 tablet (40 mg total) by mouth daily at 6 PM. 90 tablet 3  . furosemide (LASIX) 40 MG tablet Take 1 tablet (40 mg total) by mouth daily. 90 tablet 0  . meclizine (ANTIVERT) 25 MG tablet Take 1 tablet (25 mg total) by mouth as needed for dizziness or nausea. (Patient taking differently: Take 25 mg by mouth 3 (three) times daily as needed for dizziness or nausea. ) 90 tablet 1  . Melatonin 5 MG CAPS Take 10 mg by mouth at bedtime.    . pantoprazole (PROTONIX) 40 MG tablet TAKE 1 TABLET BY MOUTH TWICE DAILY. 180 tablet 0  . Pediatric Multiple Vitamins (FLINTSTONES MULTIVITAMIN PO) Take 1 tablet by mouth every evening.    . temazepam (RESTORIL) 15 MG capsule TAKE (1) CAPSULE BY MOUTH AT BEDTIME. 30 capsule 2  . traMADol (ULTRAM) 50 MG tablet TAKE (1) TABLET BY MOUTH EVERY EIGHT HOURS. 90 tablet 0  . carvedilol (COREG) 25 MG tablet Take 1 tablet (25 mg total) by mouth 2 (two) times daily with a meal. 180 tablet 1   No current facility-administered medications on file prior to visit.    BP 130/76   Temp 98.1 F (36.7 C)   Wt 100 lb 3.2 oz (45.5 kg)   BMI 18.33 kg/m        Objective:   Physical Exam Vitals and nursing note reviewed.  Constitutional:      Appearance: Normal appearance.  Cardiovascular:     Rate and Rhythm: Normal rate. Rhythm irregular.     Pulses: Normal pulses.     Heart sounds: Murmur present.  Pulmonary:     Effort: Pulmonary effort is normal.     Breath sounds: Normal breath sounds.  Abdominal:      General: Abdomen is flat.  Palpations: Abdomen is soft.  Skin:    General: Skin is warm and dry.     Capillary Refill: Capillary refill takes less than 2 seconds.  Neurological:     General: No focal deficit present.     Mental Status: She is alert and oriented to person, place, and time.  Psychiatric:        Attention and Perception: Attention and perception normal.        Mood and Affect: Mood is depressed. Affect is flat and tearful.        Behavior: Behavior normal.        Thought Content: Thought content normal.        Cognition and Memory: Cognition and memory normal.        Judgment: Judgment normal.        Assessment & Plan:  Likely her symptoms are due to depression, and she is willing to try an antidepressant for 30 days first to see if it helps her.  We will place her on Wellbutrin 150 mg extended release and have her follow-up in 30 days or sooner.  We reviewed side effects of this medication.  She was encouraged to eat a high-protein diet and can use meal replacements such as Ensure throughout the day.  Due to history of gastric cancer and dysphagia will send to gastroenterology for further evaluation though  Dorothyann Peng, NP

## 2019-10-01 DIAGNOSIS — Z23 Encounter for immunization: Secondary | ICD-10-CM | POA: Diagnosis not present

## 2019-10-02 ENCOUNTER — Inpatient Hospital Stay: Payer: Medicare Other | Admitting: Adult Health

## 2019-10-24 ENCOUNTER — Other Ambulatory Visit: Payer: Self-pay | Admitting: Adult Health

## 2019-10-28 ENCOUNTER — Ambulatory Visit (INDEPENDENT_AMBULATORY_CARE_PROVIDER_SITE_OTHER): Payer: Medicare Other | Admitting: Adult Health

## 2019-10-28 ENCOUNTER — Encounter: Payer: Self-pay | Admitting: Adult Health

## 2019-10-28 ENCOUNTER — Other Ambulatory Visit: Payer: Self-pay

## 2019-10-28 VITALS — BP 118/70 | Temp 96.3°F | Wt 98.0 lb

## 2019-10-28 DIAGNOSIS — I200 Unstable angina: Secondary | ICD-10-CM

## 2019-10-28 DIAGNOSIS — F329 Major depressive disorder, single episode, unspecified: Secondary | ICD-10-CM | POA: Diagnosis not present

## 2019-10-28 DIAGNOSIS — R531 Weakness: Secondary | ICD-10-CM | POA: Diagnosis not present

## 2019-10-28 DIAGNOSIS — E538 Deficiency of other specified B group vitamins: Secondary | ICD-10-CM

## 2019-10-28 DIAGNOSIS — R634 Abnormal weight loss: Secondary | ICD-10-CM

## 2019-10-28 MED ORDER — CYANOCOBALAMIN 1000 MCG/ML IJ SOLN
1000.0000 ug | Freq: Once | INTRAMUSCULAR | Status: AC
  Administered 2019-10-28: 1000 ug via INTRAMUSCULAR

## 2019-10-28 NOTE — Addendum Note (Signed)
Addended by: Miles Costain T on: 10/28/2019 09:51 AM   Modules accepted: Orders

## 2019-10-28 NOTE — Telephone Encounter (Signed)
Sent to the pharmacy by e-scribe. 

## 2019-10-28 NOTE — Patient Instructions (Signed)
It was great seeing you today   I am glad you are starting to get your spunk back! Lets continue on Wellbutrin as it is doing good for you   Please let me know if you need anything

## 2019-10-28 NOTE — Progress Notes (Signed)
Subjective:    Patient ID: [name], female    DOB: 1940/08/07, 79 y.o.   MRN: [id number]  HPI 79 year old female who  has a past medical history of Anemia (2005), Aortic stenosis, Arthritis, Broken back (2013), CAD (coronary artery disease), CHF (congestive heart failure) ([place]), Chronic bilateral pleural effusions, Chronic headache, Contrast media allergy, Diastolic heart failure ([place]), Esophageal cancer ([place]) (05/06/2015), Facial paresthesia, GERD (gastroesophageal reflux disease), H. pylori infection, H/O iron deficiency, HH (hiatus hernia) (2008), Hypertension, Paroxysmal atrial fibrillation ([place]), PONV (postoperative nausea and vomiting), Pulmonary emboli ([place]) (2008, 2012), and Stroke [facility]) (1982).  She presents to the office today for 1 month follow-up regarding depression, progressive weakness, and fatigue.  When she was last seen 1 month ago she did not endorse having depression which is related to not been able to drive, not being able to get outside, and family issues with her son and 2 grandchildren who are living with her as well as her youngest sister recently passing away.  She did endorse having an appetite and that food tasted good she was not eating a lot throughout the day.  She was continuing to have issues with dysphagia after her stroke and referred to GI- she has an upcoming appointment.  Wt Readings from Last 3 Encounters:  10/28/19 98 lb (44.5 kg)  09/25/19 100 lb 3.2 oz (45.5 kg)  09/12/19 98 lb 3.2 oz (44.5 kg)   Today she reports that she is feeling better. Her family member reports that she seems more perky and happy. She is getting out and having coffee and meals with friends.    Review of Systems See HPI   Past Medical History:  Diagnosis Date  . Anemia 2005   Generally microcytic, transfusions in [address]13, 2012, 02/2015, 05/2015  . Aortic stenosis    Moderate November 2017  . Arthritis   . Broken back 2013   Chronic back pain.   [name] CAD (coronary  artery disease)    Cardiac catheterization 2014 - 80% mid RCA and 70% OM managed medically  . CHF (congestive heart failure) ([place])   . Chronic bilateral pleural effusions   . Chronic headache   . Contrast media allergy   . Diastolic heart failure ([place])   . Esophageal cancer ([place]) 05/06/2015   Adenocarcinoma GE junction  . Facial paresthesia   . GERD (gastroesophageal reflux disease)   . H. pylori infection   . H/O iron deficiency    05-06-15 iron infusion ([facility])  . HH (hiatus hernia) 2008   Large with associated erosions  . Hypertension   . Paroxysmal atrial fibrillation ([name])   . PONV (postoperative nausea and vomiting)   . Pulmonary emboli ([place]) 2008, 2012  . Stroke [facility]) 1982    Social History   Socioeconomic History  . Marital status: Widowed    Spouse name: Not on file  . Number of children: 3  . Years of education: 83  . Highest education level: Not on file  Occupational History  . Occupation: Retired  Tobacco Use  . Smoking status: Never Smoker  . Smokeless tobacco: Never Used  Substance and Sexual Activity  . Alcohol use: No    Alcohol/week: 0.0 standard drinks  . Drug use: No  . Sexual activity: Not Currently  Other Topics Concern  . Not on file  Social History Narrative   Born and raised in [place], [place]. Currently resides in a house with her son. 1 dog. Fun: go to church  Divorced(Has total of #3 children)-[place], [place], [place]   Denies religious beliefs that would effect health care.    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 Difficulty of Paying Living Expenses: Somewhat hard  Food Insecurity: No Food Insecurity  . Worried About Charity fundraiser in the Last Year: Never true  . Ran Out of Food in the Last Year: Never true  Transportation Needs: No Transportation Needs  . Lack of  Transportation (Medical): No  . Lack of Transportation (Non-Medical): No  Physical Activity: Inactive  . Days of Exercise per Week: 0 days  . Minutes of Exercise per Session: 0 min  Stress: No Stress Concern Present  . Feeling of Stress : Not at all  Social Connections: Unknown  . Frequency of Communication with Friends and Family: Not on file  . Frequency of Social Gatherings with Friends and Family: Not on file  . Attends Religious Services: Not on file  . Active Member of Clubs or Organizations: Not on file  . Attends Archivist Meetings: Not on file  . Marital Status: Widowed  Intimate Partner Violence:   . Fear of Current or Ex-Partner:   . Emotionally Abused:   [name] Physically Abused:   . Sexually Abused:     Past Surgical History:  Procedure Laterality Date  . APPENDECTOMY  1950s  . BACK SURGERY    . CARDIOVERSION N/A 07/08/2019   Procedure: CARDIOVERSION;  Surgeon: Pixie Casino, MD;  Location: [place];  Service: Cardiovascular;  Laterality: N/A;  . CARPAL TUNNEL RELEASE Bilateral 1990s  . [place] SURGERY  2014  . CHOLECYSTECTOMY  1980s   open  . COLONOSCOPY N/A 02/17/2015   Procedure: COLONOSCOPY;  Surgeon: [name], MD;  [facility];  Service: Endoscopy;  Laterality: N/A;  . ESOPHAGOGASTRODUODENOSCOPY N/A 02/16/2015   Procedure: ESOPHAGOGASTRODUODENOSCOPY (EGD);  Surgeon: [name], MD;  [facility];  Service: Endoscopy;  Laterality: N/A;  . ESOPHAGOGASTRODUODENOSCOPY N/A 05/06/2015   Procedure: ESOPHAGOGASTRODUODENOSCOPY (EGD);  Surgeon: [name], MD;  Location: [facility] ENDOSCOPY;  Service: Endoscopy;  Laterality: N/A;  . EUS N/A 05/20/2015   Procedure: UPPER ENDOSCOPIC ULTRASOUND (EUS) LINEAR;  Surgeon: [name], MD;  [facility];  Service: Endoscopy;  Laterality: N/A;  . exploratory lab  1950s or 1960s  . GIVENS CAPSULE STUDY N/A 05/06/2015   Procedure: GIVENS CAPSULE STUDY;  Surgeon: [name], MD;  Location: [facility] END[facility]COPY;  Service: Endoscopy;  Laterality: N/A;  . KNEE ARTHROSCOPY WITH MEDIAL MENISECTOMY Left 04/18/2018   Procedure: LEFT KNEE ARTHROSCOPY WITH PARTIAL MEDIAL MENISECTOMY AND ANTERIOR CRUCIATE LIGAMENT DEBRIDEMENT;  Surgeon: [name], MD;  [facility];  Service: Orthopedics;  Laterality: Left;  . LEFT HEART CATH AND CORONARY ANGIOGRAPHY N/A 11/08/2016   Procedure: Left Heart Cath and Coronary Angiography;  Surgeon: [name], MD;  Location: [place] [facility];  Service: Cardiovascular;  Laterality: N/A;  . LEFT HEART CATH AND CORONARY ANGIOGRAPHY N/A 08/19/2019   Procedure: LEFT HEART CATH AND CORONARY ANGIOGRAPHY;  Surgeon: Martinique, Peter M, MD;  Location: [place] [facility];  Service: Cardiovascular;  Laterality: N/A;  . lumbar back surgery  2012  . [place] SURGERY  1991  . TONSILLECTOMY    . TONSILLECTOMY AND ADENOIDECTOMY  1960s    Family History  Problem Relation  Age of Onset  . Stroke Mother   . Heart disease Mother   . Emphysema Father   . Ovarian cancer Sister   . Stroke Sister   . Other Child        died at birth    Allergies  Allergen Reactions  . Iodinated Diagnostic Agents Anaphylaxis and Other (See Comments)    IPD dye Info given by patient  . Iodine Anaphylaxis  . Ioxaglate Anaphylaxis and Other (See Comments)  . Lactose Intolerance (Gi) Anaphylaxis  . Red Dye Anaphylaxis  . Hydralazine Anxiety and Other (See Comments)    Facial flushing, pt prefers not to use it.   . Milk-Related Compounds Other (See Comments)    Lactose intolerance Can tolerate milk if its cooked into the recipe, just can't drink milk   . Propoxyphene Rash and Anxiety    Current Outpatient Medications on File Prior to Visit  Medication Sig Dispense Refill  . acetaminophen (TYLENOL) 500 MG tablet Take 500 mg by mouth daily as needed for mild pain or moderate pain.     [name] amLODipine (NORVASC) 5 MG tablet Take 1 tablet (5 mg total) by mouth  daily. 90 tablet 0  . apixaban (ELIQUIS) 5 MG TABS tablet Take 1 tablet (5 mg total) by mouth 2 (two) times daily.    [name] atorvastatin (LIPITOR) 40 MG tablet Take 1 tablet (40 mg total) by mouth daily at 6 PM. 90 tablet 3  . buPROPion (WELLBUTRIN XL) 150 MG 24 hr tablet Take 1 tablet (150 mg total) by mouth daily. 30 tablet 0  . carvedilol (COREG) 25 MG tablet Take 1 tablet (25 mg total) by mouth 2 (two) times daily with a meal. 180 tablet 1  . furosemide (LASIX) 40 MG tablet Take 1 tablet (40 mg total) by mouth daily. 90 tablet 0  . meclizine (ANTIVERT) 25 MG tablet Take 1 tablet (25 mg total) by mouth as needed for dizziness or nausea. (Patient taking differently: Take 25 mg by mouth 3 (three) times daily as needed for dizziness or nausea. ) 90 tablet 1  . Melatonin 5 MG CAPS Take 10 mg by mouth at bedtime.    . pantoprazole (PROTONIX) 40 MG tablet TAKE 1 TABLET BY MOUTH TWICE DAILY. 180 tablet 0  . Pediatric Multiple Vitamins (FLINTSTONES MULTIVITAMIN PO) Take 1 tablet by mouth every evening.    . temazepam (RESTORIL) 15 MG capsule TAKE (1) CAPSULE BY MOUTH AT BEDTIME. 30 capsule 2  . traMADol (ULTRAM) 50 MG tablet TAKE (1) TABLET BY MOUTH EVERY EIGHT HOURS. 90 tablet 0   No current facility-administered medications on file prior to visit.    There were no vitals taken for this visit.      Objective:   Physical Exam Vitals and nursing note reviewed.  Constitutional:      Appearance: Normal appearance. She is underweight.  Skin:    General: Skin is dry.     Capillary Refill: Capillary refill takes less than 2 seconds.  Neurological:     General: No focal deficit present.     Mental Status: She is alert and oriented to person, place, and time.  Psychiatric:        Mood and Affect: Mood normal.        Behavior: Behavior normal.        Thought Content: Thought content normal.        Judgment: Judgment normal.       Assessment & Plan:  1. Reactive depression -  Improving on  wellbutrin. Will continue current dose  - Follow up as needed  2. Generalized weakness - Continue to stay active. Encouraged to walk outside   3. Weight loss - OK with adding plant protein to smoothies - she is unable to handle lactose  - Follow up with GI   Dorothyann Peng, NP

## 2019-10-29 DIAGNOSIS — Z23 Encounter for immunization: Secondary | ICD-10-CM | POA: Diagnosis not present

## 2019-11-10 ENCOUNTER — Encounter: Payer: Self-pay | Admitting: *Deleted

## 2019-11-17 ENCOUNTER — Encounter: Payer: Self-pay | Admitting: *Deleted

## 2019-11-17 ENCOUNTER — Telehealth: Payer: Self-pay | Admitting: *Deleted

## 2019-11-17 ENCOUNTER — Telehealth: Payer: Self-pay | Admitting: Internal Medicine

## 2019-11-17 NOTE — Telephone Encounter (Signed)
Patient has changed her visit to a mychart virtual visit. She indicates her Dr originally sent her here for weight loss as she has a history of colon cancer. She indicates she is currently 96 pounds, down from 110 lb but this started abruptly on 03/06/20 when she suffered a stroke. Patient notes that since her stroke, she gets choked very easily with eating and drinking. No vomiting or reflux symptoms. No change in bowels or lower GI symptoms.

## 2019-11-17 NOTE — Telephone Encounter (Signed)
you are scheduled for a virtual visit with your provider today.  Just as we do with appointments in the office, we must obtain your consent to participate.  Your consent will be active for this visit and any virtual visit you may have with one of our providers in the next 365 days.  If you have a MyChart account, I can also send a copy of this consent to you electronically.  All virtual visits are billed to your insurance company just like a traditional visit in the office.  As this is a virtual visit, video technology does not allow for your provider to perform a traditional examination.  This may limit your provider's ability to fully assess your condition.  If your provider identifies any concerns that need to be evaluated in person or the need to arrange testing such as labs, procedures, imaging etc, we will make arrangements to do so.  Although advances in technology are sophisticated, we cannot ensure that it will always work on either your end or our end.  If the connection with a video visit is poor, we may have to switch to a telephone visit.  With either a video or telephone visit, we are not always able to ensure that we have a secure connection.   I need to obtain your verbal consent now.   Are you willing to proceed with your visit today?   The patient agrees to proceed with the appointment and gives their verbal consent.   Date: 11/16/19  Expires:11/15/20

## 2019-11-18 ENCOUNTER — Telehealth (INDEPENDENT_AMBULATORY_CARE_PROVIDER_SITE_OTHER): Payer: Medicare Other | Admitting: Internal Medicine

## 2019-11-18 DIAGNOSIS — Z8501 Personal history of malignant neoplasm of esophagus: Secondary | ICD-10-CM

## 2019-11-18 DIAGNOSIS — R1310 Dysphagia, unspecified: Secondary | ICD-10-CM | POA: Diagnosis not present

## 2019-11-18 DIAGNOSIS — I200 Unstable angina: Secondary | ICD-10-CM

## 2019-11-18 DIAGNOSIS — R634 Abnormal weight loss: Secondary | ICD-10-CM

## 2019-11-18 DIAGNOSIS — K449 Diaphragmatic hernia without obstruction or gangrene: Secondary | ICD-10-CM | POA: Diagnosis not present

## 2019-11-18 NOTE — Patient Instructions (Addendum)
You have been scheduled for a Barium Esophogram/upper GI series at [facility] Radiology (1st floor of the hospital) on Thursday, 12/04/19 at 9:30 am. Please arrive 15 minutes prior to your appointment for registration. Make certain not to have anything to eat or drink after midnight on the night before your test. If you need to reschedule for any reason, please contact radiology at [phone] to do so. ________________________________________________________________ A barium swallow is an examination that concentrates on views of the esophagus. This tends to be a double contrast exam (barium and two liquids which, when combined, create a gas to distend the wall of the oesophagus) or single contrast (non-ionic iodine based). The study is usually tailored to your symptoms so a good history is essential. Attention is paid during the study to the form, structure and configuration of the esophagus, looking for functional disorders (such as aspiration, dysphagia, achalasia, motility and reflux) EXAMINATION You may be asked to change into a gown, depending on the type of swallow being performed. A radiologist and radiographer will perform the procedure. The radiologist will advise you of the type of contrast selected for your procedure and direct you during the exam. You will be asked to stand, sit or lie in several different positions and to hold a small amount of fluid in your mouth before being asked to swallow while the imaging is performed .In some instances you may be asked to swallow barium coated marshmallows to assess the motility of a solid food bolus. The exam can be recorded as a digital or video fluoroscopy procedure. POST PROCEDURE It will take 1-2 days for the barium to pass through your system. To facilitate this, it is important, unless otherwise directed, to increase your fluids for the next 24-48hrs and to resume your normal diet.  This test typically takes about 30 minutes to  perform. ________________________________________________________________ An upper GI series uses x rays to help diagnose problems of the upper GI tract, which includes the esophagus, stomach, and duodenum. The duodenum is the first part of the small intestine. An upper GI series is conducted by a radiology technologist or a radiologist--a doctor who specializes in x-ray imaging--at a hospital or outpatient center. While sitting or standing in front of an x-ray machine, the patient drinks barium liquid, which is often white and has a chalky consistency and taste. The barium liquid coats the lining of the upper GI tract and makes signs of disease show up more clearly on x rays. X-ray video, called fluoroscopy, is used to view the barium liquid moving through the esophagus, stomach, and duodenum. Additional x rays and fluoroscopy are performed while the patient lies on an x-ray table. To fully coat the upper GI tract with barium liquid, the technologist or radiologist may press on the abdomen or ask the patient to change position. Patients hold still in various positions, allowing the technologist or radiologist to take x rays of the upper GI tract at different angles. If a technologist conducts the upper GI series, a radiologist will later examine the images to look for problems.  This test typically takes about 1 hour to complete.

## 2019-11-18 NOTE — Progress Notes (Signed)
This service was provided via telemedicine.  MyChart video visit with A/V communication The patient was located at her son's home in [facility] The provider was located in provider's GI office. The patient did consent to this telephone visit and is aware of possible charges through their insurance for this visit.   The persons participating in this telemedicine service were the patient, her son and I. Time spent on call: 16 min   Patient ID: [name], female   DOB: 1941/04/10, 79 y.o.   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  She is known to me from inpatient evaluation of GI bleeding in 2016.  I performed an upper endoscopy in November 2016 to evaluate iron deficiency anemia with intermittent melena and recurrent anemia.  This revealed a tortuous esophagus, a polypoid lesion at the gastric cardia which was adenocarcinoma, a large 10 cm hiatal hernia with Cameron's erosions.  Her GE junction tumor was removed by ESD en bloc in February 2017 by Dr. Stephanie Acre at [facility].  This was found to be intramucosal adenocarcinoma.  She had a stroke unfortunately on 03/07/2019.  Since this time she is had an issue with her swallowing and reports that she "gets choked easily".  This seems to happen with both solids and liquids.  She is eating a soft diet with focus on very small pieces of meat but also soft foods like cream potatoes and gravy.  She had been losing weight but her son brought her to his home on the [place] and he has helped her focus on nutrition and she has been able to gain a few pounds since living with him.  She is having Ensure on a  daily basis which she is tolerating well.  There are times where she will cough with eating and drinking.  She is not having chest pain but feels a band like pressure discomfort under her left rib cage across her upper abdomen.  She is using pantoprazole 40 mg twice daily and denies heartburn symptoms.  She reports her bowel movements is regular without blood or melena.  She does continue Eliquis.  Of note after surveillance upper endoscopy performed in 2018 at [facility] she was referred for consideration of repair of large hiatal hernia.  Surgery deemed this too risky and thus was never performed.  Past Medical History:  Diagnosis Date  . Anemia 2005   Generally microcytic, transfusions in [address], 2012, 02/2015, 05/2015  . Aortic stenosis    Moderate November 2017  . Arthritis   . Broken back 2013   Chronic back pain.   [name] CAD (coronary artery disease)    Cardiac catheterization 2014 - 80% mid RCA and 70% OM managed medically  . CHF (congestive heart failure) ([place])   . Chronic bilateral pleural effusions   . Chronic headache   . Contrast media allergy   . Diastolic heart failure ([place])   . Diverticulosis   . Esophageal cancer ([place]) 05/06/2015   Adenocarcinoma GE junction  . Facial paresthesia   . GERD (gastroesophageal reflux disease)   . H. pylori infection   . H/O iron deficiency    05-06-15 iron infusion ([facility])  .  HH (hiatus hernia) 2008   Large with associated erosions  . Hypertension   . Paroxysmal atrial fibrillation ([name])   . PONV (postoperative nausea and vomiting)   . Pulmonary emboli ([place]) 2008, 2012  . Stroke ([place]) 1982  . Swallowing dysfunction     Past Surgical History:  Procedure Laterality Date  . APPENDECTOMY  1950s  . CARDIOVERSION N/A 07/08/2019   Procedure: CARDIOVERSION;  Surgeon: Pixie Casino, MD;  Location: [place];  Service: Cardiovascular;  Laterality: N/A;  . CARPAL TUNNEL RELEASE Bilateral 1990s  . [place] SURGERY  2014  . CHOLECYSTECTOMY   1980s   open  . COLONOSCOPY N/A 02/17/2015   Procedure: COLONOSCOPY;  Surgeon: [name], MD;  Location: [facility] ENDOSCOPY;  Service: Endoscopy;  Laterality: N/A;  . ESOPHAGOGASTRODUODENOSCOPY N/A 02/16/2015   Procedure: ESOPHAGOGASTRODUODENOSCOPY (EGD);  Surgeon: [name], MD;  Location: [facility];  Service: Endoscopy;  Laterality: N/A;  . ESOPHAGOGASTRODUODENOSCOPY N/A 05/06/2015   Procedure: ESOPHAGOGASTRODUODENOSCOPY (EGD);  Surgeon: [name], MD;  Location: [facility] ENDOSCOPY;  Service: Endoscopy;  Laterality: N/A;  . EUS N/A 05/20/2015   Procedure: UPPER ENDOSCOPIC ULTRASOUND (EUS) LINEAR;  Surgeon: [name], MD;  Location: [facility] ENDOSCOPY;  Service: Endoscopy;  Laterality: N/A;  . exploratory lab  1950s or 1960s  . GIVENS CAPSULE STUDY N/A 05/06/2015   Procedure: GIVENS CAPSULE STUDY;  Surgeon: [name], MD;  Location: [facility] ENDOSCOPY;  Service: Endoscopy;  Laterality: N/A;  . KNEE ARTHROSCOPY WITH MEDIAL MENISECTOMY Left 04/18/2018   Procedure: LEFT KNEE ARTHROSCOPY WITH PARTIAL MEDIAL MENISECTOMY AND ANTERIOR CRUCIATE LIGAMENT DEBRIDEMENT;  Surgeon: [name], MD;  Location: [facility];  Service: Orthopedics;  Laterality: Left;  . LEFT HEART CATH AND CORONARY ANGIOGRAPHY N/A 11/08/2016   Procedure: Left Heart Cath and Coronary Angiography;  Surgeon: [name], MD;  Location: [place] CV LAB;  Service: Cardiovascular;  Laterality: N/A;  . LEFT HEART CATH AND CORONARY ANGIOGRAPHY N/A 08/19/2019   Procedure: LEFT HEART CATH AND CORONARY ANGIOGRAPHY;  Surgeon: Martinique, Peter M, MD;  Location: [place] CV LAB;  Service: Cardiovascular;  Laterality: N/A;  . lumbar back surgery  2012  . [place] SURGERY  1991  . TONSILLECTOMY AND ADENOIDECTOMY  1960s    Outpatient Medications Prior to Visit  Medication Sig Dispense Refill  . acetaminophen (TYLENOL) 500 MG tablet Take 500 mg by mouth daily as needed for mild pain or moderate pain.     [name] amLODipine (NORVASC) 5  MG tablet Take 1 tablet (5 mg total) by mouth daily. 90 tablet 0  . apixaban (ELIQUIS) 5 MG TABS tablet Take 1 tablet (5 mg total) by mouth 2 (two) times daily.    [name] atorvastatin (LIPITOR) 40 MG tablet Take 1 tablet (40 mg total) by mouth daily at 6 PM. 90 tablet 3  . buPROPion (WELLBUTRIN XL) 150 MG 24 hr tablet TAKE (1) TABLET BY MOUTH ONCE DAILY. 90 tablet 1  . carvedilol (COREG) 25 MG tablet Take 1 tablet (25 mg total) by mouth 2 (two) times daily with a meal. 180 tablet 1  . furosemide (LASIX) 40 MG tablet Take 1 tablet (40 mg total) by mouth daily. 90 tablet 0  . meclizine (ANTIVERT) 25 MG tablet Take 1 tablet (25 mg total) by mouth as needed for dizziness or nausea. (Patient taking differently: Take 25 mg by mouth 3 (three) times daily as needed for dizziness or nausea. ) 90 tablet 1  . Melatonin 5 MG  CAPS Take 10 mg by mouth at bedtime.    . pantoprazole (PROTONIX) 40 MG tablet TAKE 1 TABLET BY MOUTH TWICE DAILY. 180 tablet 0  . Pediatric Multiple Vitamins (FLINTSTONES MULTIVITAMIN PO) Take 1 tablet by mouth every evening.    . temazepam (RESTORIL) 15 MG capsule TAKE (1) CAPSULE BY MOUTH AT BEDTIME. 30 capsule 2  . traMADol (ULTRAM) 50 MG tablet TAKE (1) TABLET BY MOUTH EVERY EIGHT HOURS. (Patient taking differently: every 8 (eight) hours as needed. ) 90 tablet 0   No facility-administered medications prior to visit.    Allergies  Allergen Reactions  . Iodinated Diagnostic Agents Anaphylaxis and Other (See Comments)    IPD dye Info given by patient  . Iodine Anaphylaxis  . Ioxaglate Anaphylaxis and Other (See Comments)  . Lactose Intolerance (Gi) Anaphylaxis  . Red Dye Anaphylaxis  . Hydralazine Anxiety and Other (See Comments)    Facial flushing, pt prefers not to use it.   . Milk-Related Compounds Other (See Comments)    Lactose intolerance Can tolerate milk if its cooked into the recipe, just can't drink milk   . Propoxyphene Rash and Anxiety    Family History  Problem  Relation Age of Onset  . Stroke Mother   . Heart disease Mother   . Emphysema Father   . Liver disease Sister   . Ovarian cancer Sister   . Stroke Sister   . Other Child        died at birth  . Colon cancer Neg Hx   . Esophageal cancer Neg Hx   . Stomach cancer Neg Hx   . Pancreatic cancer Neg Hx     Social History   Tobacco Use  . Smoking status: Never Smoker  . Smokeless tobacco: Never Used  Substance Use Topics  . Alcohol use: No    Alcohol/week: 0.0 standard drinks  . Drug use: No    ROS: As per history of present illness, otherwise negative  There were no vitals taken for this visit. Constitutional: Well-developed elderly female in no acute distress by video visit HEENT: Normocephalic and atraumatic.  No noticeable scleral icterus. Neurological: Alert and oriented to person place and time. Psychiatric: Normal mood and affect. Behavior is normal.  RELEVANT LABS AND IMAGING: CBC    Component Value Date/Time   WBC 6.0 08/14/2019 1101   WBC 8.0 03/18/2019 1139   RBC 4.30 08/14/2019 1101   RBC 4.67 03/18/2019 1139   HGB 12.4 08/14/2019 1101   HGB 13.1 06/04/2017 1017   HCT 37.8 08/14/2019 1101   HCT 38.6 06/04/2017 1017   PLT 154 08/14/2019 1101   MCV 88 08/14/2019 1101   MCV 88.5 06/04/2017 1017   MCH 28.8 08/14/2019 1101   MCH 29.3 03/15/2019 1720   MCHC 32.8 08/14/2019 1101   MCHC 35.1 03/18/2019 1139   RDW 13.0 08/14/2019 1101   RDW 12.7 06/04/2017 1017   LYMPHSABS 1.8 05/08/2019 1511   LYMPHSABS 1.5 06/04/2017 1017   MONOABS 0.6 03/18/2019 1139   MONOABS 0.4 06/04/2017 1017   EOSABS 0.1 05/08/2019 1511   BASOSABS 0.0 05/08/2019 1511   BASOSABS 0.0 06/04/2017 1017    CMP     Component Value Date/Time   NA 144 08/14/2019 1101   K 4.2 08/14/2019 1101   CL 100 08/14/2019 1101   CO2 26 08/14/2019 1101   GLUCOSE 129 (H) 08/14/2019 1101   GLUCOSE 150 (H) 07/08/2019 1021   BUN 14 08/14/2019 1101   CREATININE 1.04 (  H) 08/14/2019 1101    CREATININE 1.02 (H) 11/02/2016 1133   CALCIUM 9.3 08/14/2019 1101   PROT 7.2 03/06/2019 2132   ALBUMIN 4.0 03/06/2019 2132   AST 17 03/06/2019 2132   ALT 15 03/06/2019 2132   ALKPHOS 89 03/06/2019 2132   BILITOT 1.0 03/06/2019 2132   GFRNONAA 51 (L) 08/14/2019 1101   GFRAA 59 (L) 08/14/2019 1101   EGD -- March 2018 [facility]) Findings:    The esophagus was normal.    A large hiatal hernia was present.    A medium post mucosectomy scar was found in the cardia. There was no     evidence of the previous polyp.    The examined duodenum was normal.                                          Impression:    - Normal esophagus.           - Large hiatal hernia.           - Scar in the cardia.           - Normal examined duodenum.           - No specimens collected. Recommendation:  - Patient has a contact number available for emergencies.            The signs and symptoms of potential delayed complications            were discussed with the patient. Return to normal            activities tomorrow. Written discharge instructions were            provided to the patient.           - Continue present medications.           - Resume previous diet.           - Refer to a surgeon at appointment to be scheduled fro            consideration of hernia repair.  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    1.  Dysphagia symptom/weight loss/large hiatal hernia/history of intramucosal adenocarcinoma at the GE junction status post ESD--it is  difficult to know if her dysphagia is more neurologic post CVA in September or related to esophageal dysmotility and her large hiatal hernia.  Given her history of intramucosal adenocarcinoma at the GE junction, this cannot be completely excluded but is felt very unlikely given that this was intramucosal adenocarcinoma only and not present at last surveillance endoscopy.  I do think that her bandlike pain in the upper abdomen is directly related to her large, 10 cm, hiatus hernia.  Her weight loss has improved since focusing on high-protein diet and Ensure nutritional supplements.  This is [place] getting her higher calorie intake.  I have recommended that we evaluate her swallowing function and esophagus/stomach with a barium swallow and upper GI series.  I am hesitant to recommend repeat upper endoscopy  at this time given her chronic comorbidities but this could be considered if felt needed after imaging. --Barium esophagram with upper GI series    [id number], [address], [address] [place],  [place] [id number]

## 2019-11-18 NOTE — Addendum Note (Signed)
Addended by: Larina Bras on: 11/18/2019 02:03 PM   Modules accepted: Orders

## 2019-11-20 ENCOUNTER — Other Ambulatory Visit: Payer: Self-pay | Admitting: Adult Health

## 2019-11-20 ENCOUNTER — Telehealth: Payer: Self-pay | Admitting: Internal Medicine

## 2019-11-20 DIAGNOSIS — I10 Essential (primary) hypertension: Secondary | ICD-10-CM

## 2019-11-20 NOTE — Telephone Encounter (Signed)
Called patient to schedule appt from recall list, left message

## 2019-11-24 ENCOUNTER — Telehealth: Payer: Self-pay | Admitting: Adult Health

## 2019-11-24 ENCOUNTER — Other Ambulatory Visit: Payer: Self-pay

## 2019-11-24 ENCOUNTER — Encounter (HOSPITAL_COMMUNITY): Payer: Self-pay

## 2019-11-24 ENCOUNTER — Emergency Department (HOSPITAL_COMMUNITY): Payer: Medicare Other

## 2019-11-24 ENCOUNTER — Emergency Department (HOSPITAL_COMMUNITY)
Admission: EM | Admit: 2019-11-24 | Discharge: 2019-11-24 | Disposition: A | Discharge status: Home / Self Care | Payer: Medicare Other | Attending: Emergency Medicine | Admitting: Emergency Medicine

## 2019-11-24 ENCOUNTER — Telehealth: Payer: Self-pay | Admitting: Internal Medicine

## 2019-11-24 DIAGNOSIS — S52692A Other fracture of lower end of left ulna, initial encounter for closed fracture: Secondary | ICD-10-CM

## 2019-11-24 DIAGNOSIS — Y93H2 Activity, gardening and landscaping: Secondary | ICD-10-CM | POA: Diagnosis not present

## 2019-11-24 DIAGNOSIS — I2510 Atherosclerotic heart disease of native coronary artery without angina pectoris: Secondary | ICD-10-CM | POA: Diagnosis not present

## 2019-11-24 DIAGNOSIS — Z7901 Long term (current) use of anticoagulants: Secondary | ICD-10-CM | POA: Insufficient documentation

## 2019-11-24 DIAGNOSIS — S52592A Other fractures of lower end of left radius, initial encounter for closed fracture: Secondary | ICD-10-CM | POA: Diagnosis not present

## 2019-11-24 DIAGNOSIS — S52502A Unspecified fracture of the lower end of left radius, initial encounter for closed fracture: Secondary | ICD-10-CM

## 2019-11-24 DIAGNOSIS — W010XXA Fall on same level from slipping, tripping and stumbling without subsequent striking against object, initial encounter: Secondary | ICD-10-CM | POA: Insufficient documentation

## 2019-11-24 DIAGNOSIS — I5032 Chronic diastolic (congestive) heart failure: Secondary | ICD-10-CM | POA: Diagnosis not present

## 2019-11-24 DIAGNOSIS — Z8673 Personal history of transient ischemic attack (TIA), and cerebral infarction without residual deficits: Secondary | ICD-10-CM | POA: Insufficient documentation

## 2019-11-24 DIAGNOSIS — I110 Hypertensive heart disease with heart failure: Secondary | ICD-10-CM | POA: Insufficient documentation

## 2019-11-24 DIAGNOSIS — Z79899 Other long term (current) drug therapy: Secondary | ICD-10-CM | POA: Insufficient documentation

## 2019-11-24 DIAGNOSIS — Y92017 Garden or yard in single-family (private) house as the place of occurrence of the external cause: Secondary | ICD-10-CM | POA: Diagnosis not present

## 2019-11-24 DIAGNOSIS — S59902A Unspecified injury of left elbow, initial encounter: Secondary | ICD-10-CM | POA: Diagnosis not present

## 2019-11-24 DIAGNOSIS — S52602A Unspecified fracture of lower end of left ulna, initial encounter for closed fracture: Secondary | ICD-10-CM | POA: Diagnosis not present

## 2019-11-24 DIAGNOSIS — S59912A Unspecified injury of left forearm, initial encounter: Secondary | ICD-10-CM | POA: Diagnosis present

## 2019-11-24 DIAGNOSIS — Y998 Other external cause status: Secondary | ICD-10-CM | POA: Insufficient documentation

## 2019-11-24 MED ORDER — ONDANSETRON HCL 4 MG/2ML IJ SOLN
4.0000 mg | Freq: Once | INTRAMUSCULAR | Status: AC
  Administered 2019-11-24: 4 mg via INTRAVENOUS
  Filled 2019-11-24: qty 2

## 2019-11-24 MED ORDER — HYDROCODONE-ACETAMINOPHEN 5-325 MG PO TABS: 1.0000 | ORAL_TABLET | ORAL | 0 refills | Status: DC | PRN

## 2019-11-24 MED ORDER — SODIUM CHLORIDE 0.9 % IV SOLN: INTRAVENOUS | Status: DC

## 2019-11-24 MED ORDER — HYDROCODONE-ACETAMINOPHEN 5-325 MG PO TABS
1.0000 | ORAL_TABLET | Freq: Once | ORAL | Status: AC
  Administered 2019-11-24: 1 via ORAL
  Filled 2019-11-24: qty 1

## 2019-11-24 MED ORDER — FENTANYL CITRATE (PF) 100 MCG/2ML IJ SOLN
INTRAMUSCULAR | Status: AC
  Filled 2019-11-24: qty 2

## 2019-11-24 MED ORDER — FENTANYL CITRATE (PF) 100 MCG/2ML IJ SOLN
50.0000 ug | Freq: Once | INTRAMUSCULAR | Status: AC
  Administered 2019-11-24: 50 ug via INTRAVENOUS

## 2019-11-24 MED ORDER — FENTANYL CITRATE (PF) 100 MCG/2ML IJ SOLN
50.0000 ug | Freq: Once | INTRAMUSCULAR | Status: AC
  Administered 2019-11-24: 50 ug via INTRAVENOUS
  Filled 2019-11-24: qty 2

## 2019-11-24 NOTE — ED Provider Notes (Signed)
[facility] Provider Note   CSN: [id number] Arrival date & time: 11/24/19  [id number]     History Chief Complaint  Patient presents with  . Arm Pain    [name] is a 79 y.o. female.  HPI She was leaning over with hedge tremors, to trim weeds, in a ditch, when she tumbled forward injuring her left wrist.  A bystander saw her lying on the ground and called EMS who transferred her here.  She complains of pain only in her left arm, denies pain in her right arm or legs.  She remembers incident and does not feel that she lost consciousness.  She has a splint on her left forearm/wrist.  She denies recent illnesses.  There are no other known modifying factors.    Past Medical History:  Diagnosis Date  . Anemia 2005   Generally microcytic, transfusions in [address], 2012, 02/2015, 05/2015  . Aortic stenosis    Moderate November 2017  . Arthritis   . Broken back 2013   Chronic back pain.   [name] CAD (coronary artery disease)    Cardiac catheterization 2014 - 80% mid RCA and 70% OM managed medically  . CHF (congestive heart failure) ([place])   . Chronic bilateral pleural effusions   . Chronic headache   . Contrast media allergy   . Diastolic heart failure ([place])   . Diverticulosis   . Esophageal cancer ([facility]) 05/06/2015   Adenocarcinoma GE junction  . Facial paresthesia   . GERD (gastroesophageal reflux disease)   . H. pylori infection   . H/O iron deficiency    05-06-15 iron infusion ([facility])  . HH (hiatus hernia) 2008   Large with associated erosions  . Hypertension   . Paroxysmal atrial fibrillation ([name])   . PONV (postoperative nausea and vomiting)   . Pulmonary emboli ([place]) 2008, 2012  . Stroke ([place]) 1982  . Swallowing dysfunction     Patient Active Problem List   Diagnosis Date Noted  . Persistent atrial fibrillation ([place])   . Epistaxis, recurrent 03/20/2019  . Stroke ([place]) 03/07/2019  . S/P left knee arthroscopy 04/18/18 06/06/2018  . Acute medial meniscus  tear, left, subsequent encounter 04/18/2018  . Bilateral pleural effusion   . Peripheral neuropathy 12/21/2017  . CAD (coronary artery disease) 12/19/2017  . Numbness of face 01/09/2017  . Unstable angina ([place]) 11/02/2016  . B12 deficiency 10/16/2016  . Hyperglycemia 06/26/2016  . Encounter for medication review and counseling 06/07/2016  . Essential hypertension, benign 04/19/2016  . Aortic valve stenosis 09/28/2015  . GE junction carcinoma ([place]) 05/14/2015  . Cameron lesion, acute   . IDA (iron deficiency anemia)   . Mixed hyperlipidemia 05/05/2015  . GERD (gastroesophageal reflux disease) 05/05/2015  . Absolute anemia   . Osteoarthritis 04/16/2015  . History of pulmonary embolism 03/02/2015  . Benign neoplasm of descending colon 02/17/2015  . Diverticulosis of large intestine without diverticulitis 02/17/2015  . Esophageal stricture 02/16/2015  . Macular degeneration 09/01/2014  . Benign paroxysmal positional vertigo   . Uncontrolled hypertension 08/24/2014  . Sleep disturbance 08/03/2014  . PAF (paroxysmal atrial fibrillation) ([place]) 01/21/2014  . Long term current use of anticoagulant therapy 01/21/2014  . Chronic diastolic congestive heart failure ([place]) 01/21/2014    Past Surgical History:  Procedure Laterality Date  . APPENDECTOMY  1950s  . CARDIOVERSION N/A 07/08/2019   Procedure: CARDIOVERSION;  Surgeon: Pixie Casino, MD;  Location: [facility];  Service: Cardiovascular;  Laterality: N/A;  . CARPAL TUNNEL  RELEASE Bilateral 1990s  . [place] SURGERY  2014  . CHOLECYSTECTOMY  1980s   open  . COLONOSCOPY N/A 02/17/2015   Procedure: COLONOSCOPY;  Surgeon: [name], MD;  Location: [facility];  Service: Endoscopy;  Laterality: N/A;  . ESOPHAGOGASTRODUODENOSCOPY N/A 02/16/2015   Procedure: ESOPHAGOGASTRODUODENOSCOPY (EGD);  Surgeon: [name], MD;  Location: [facility];  Service: Endoscopy;  Laterality: N/A;  . ESOPHAGOGASTRODUODENOSCOPY N/A  05/06/2015   Procedure: ESOPHAGOGASTRODUODENOSCOPY (EGD);  Surgeon: [name], MD;  Location: [facility] ENDOSCOPY;  Service: Endoscopy;  Laterality: N/A;  . EUS N/A 05/20/2015   Procedure: UPPER ENDOSCOPIC ULTRASOUND (EUS) LINEAR;  Surgeon: [name], MD;  Location: [facility] ENDOSCOPY;  Service: Endoscopy;  Laterality: N/A;  . exploratory lab  1950s or 1960s  . GIVENS CAPSULE STUDY N/A 05/06/2015   Procedure: GIVENS CAPSULE STUDY;  Surgeon: [name], MD;  Location: [facility] ENDOSCOPY;  Service: Endoscopy;  Laterality: N/A;  . KNEE ARTHROSCOPY WITH MEDIAL MENISECTOMY Left 04/18/2018   Procedure: LEFT KNEE ARTHROSCOPY WITH PARTIAL MEDIAL MENISECTOMY AND ANTERIOR CRUCIATE LIGAMENT DEBRIDEMENT;  Surgeon: [name], MD;  Location: [facility];  Service: Orthopedics;  Laterality: Left;  . LEFT HEART CATH AND CORONARY ANGIOGRAPHY N/A 11/08/2016   Procedure: Left Heart Cath and Coronary Angiography;  Surgeon: [name], MD;  Location: [facility] CV LAB;  Service: Cardiovascular;  Laterality: N/A;  . LEFT HEART CATH AND CORONARY ANGIOGRAPHY N/A 08/19/2019   Procedure: LEFT HEART CATH AND CORONARY ANGIOGRAPHY;  Surgeon: Martinique, Peter M, MD;  Location: [facility] CV LAB;  Service: Cardiovascular;  Laterality: N/A;  . lumbar back surgery  2012  . [place] SURGERY  1991  . TONSILLECTOMY AND ADENOIDECTOMY  1960s     OB History   No obstetric history on file.     Family History  Problem Relation Age of Onset  . Stroke Mother   . Heart disease Mother   . Emphysema Father   . Liver disease Sister   . Ovarian cancer Sister   . Stroke Sister   . Other Child        died at birth  . Colon cancer Neg Hx   . Esophageal cancer Neg Hx   . Stomach cancer Neg Hx   . Pancreatic cancer Neg Hx     Social History   Tobacco Use  . Smoking status: Never Smoker  . Smokeless tobacco: Never Used  Substance Use Topics  . Alcohol use: No    Alcohol/week: 0.0 standard drinks  . Drug use: No    Home  Medications Prior to Admission medications   Medication Sig Start Date End Date Taking? Authorizing Provider  acetaminophen (TYLENOL) 500 MG tablet Take 500 mg by mouth daily as needed for mild pain or moderate pain.     [provider]  amLODipine (NORVASC) 5 MG tablet Take 1 tablet (5 mg total) by mouth daily. 08/25/19   Hilty, Nadean Corwin, MD  apixaban (ELIQUIS) 5 MG TABS tablet Take 1 tablet (5 mg total) by mouth 2 (two) times daily. 08/20/19   Martinique, Peter M, MD  atorvastatin (LIPITOR) 40 MG tablet Take 1 tablet (40 mg total) by mouth daily at 6 PM. 05/15/19   Nafziger, Tommi Rumps, NP  buPROPion (WELLBUTRIN XL) 150 MG 24 hr tablet TAKE (1) TABLET BY MOUTH ONCE DAILY. 10/28/19   Nafziger, Tommi Rumps, NP  carvedilol (COREG) 25 MG tablet Take 1 tablet (25 mg total) by mouth 2 (two) times daily with  a meal. 11/21/19 02/19/20  Nafziger, Tommi Rumps, NP  furosemide (LASIX) 40 MG tablet Take 1 tablet (40 mg total) by mouth daily. 08/25/19   Nafziger, Tommi Rumps, NP  HYDROcodone-acetaminophen (NORCO) 5-325 MG tablet Take 1 tablet by mouth every 4 (four) hours as needed for moderate pain. 11/24/19   Daleen Bo, MD  meclizine (ANTIVERT) 25 MG tablet Take 1 tablet (25 mg total) by mouth as needed for dizziness or nausea. Patient taking differently: Take 25 mg by mouth 3 (three) times daily as needed for dizziness or nausea.  08/02/18   Nafziger, Tommi Rumps, NP  Melatonin 5 MG CAPS Take 10 mg by mouth at bedtime.    [provider]  pantoprazole (PROTONIX) 40 MG tablet TAKE 1 TABLET BY MOUTH TWICE DAILY. 08/25/19   Nafziger, Tommi Rumps, NP  Pediatric Multiple Vitamins (FLINTSTONES MULTIVITAMIN PO) Take 1 tablet by mouth every evening.    [provider]  temazepam (RESTORIL) 15 MG capsule TAKE (1) CAPSULE BY MOUTH AT BEDTIME. 09/23/19   Nafziger, Tommi Rumps, NP  traMADol (ULTRAM) 50 MG tablet TAKE (1) TABLET BY MOUTH EVERY EIGHT HOURS. Patient taking differently: every 8 (eight) hours as needed.  09/17/19   Nafziger, Tommi Rumps, NP     Allergies    Iodinated diagnostic agents, Iodine, Ioxaglate, Lactose intolerance (gi), Red dye, Hydralazine, Milk-related compounds, and Propoxyphene  Review of Systems   Review of Systems  All other systems reviewed and are negative.   Physical Exam Updated Vital Signs BP (!) 156/81   Pulse (!) 56   Temp 98 F (36.7 C) (Oral)   Resp 18   Ht 5\' 2"  (1.575 m)   Wt 43.5 kg   SpO2 93%   BMI 17.56 kg/m   Physical Exam Vitals and nursing note reviewed.  Constitutional:      General: She is in acute distress (Uncomfortable).     Appearance: She is well-developed. She is not ill-appearing, toxic-appearing or diaphoretic.  HENT:     Head: Normocephalic and atraumatic.  Eyes:     Conjunctiva/sclera: Conjunctivae normal.     Pupils: Pupils are equal, round, and reactive to light.  Neck:     Trachea: Phonation normal.  Cardiovascular:     Rate and Rhythm: Normal rate.  Pulmonary:     Effort: Pulmonary effort is normal.  Abdominal:     Tenderness: There is no abdominal tenderness.  Musculoskeletal:     Cervical back: Normal range of motion and neck supple.     Comments: Initial examination and splint: Left elbow tender, left wrist tender, left hand tender and swollen.  Neurovascular intact distally in the fingers of the left hand.  Skin:    General: Skin is warm and dry.  Neurological:     Mental Status: She is alert and oriented to person, place, and time.     Motor: No abnormal muscle tone.  Psychiatric:        Mood and Affect: Mood normal.        Behavior: Behavior normal.        Thought Content: Thought content normal.        Judgment: Judgment normal.     ED Results / Procedures / Treatments   Labs (all labs ordered are listed, but only abnormal results are displayed) Labs Reviewed - No data to display  EKG None  Radiology DG Elbow Complete Left  Result Date: 11/24/2019 CLINICAL DATA:  Pt c/o severe pain in Lt hand, Lt wrist, and Lt elbow. Pt states she  fell while working in her yard this AM. Pt was splinted by EMS and when attempting to remove splint for imaging Pt screamed out and did not want it removed. pain EXAM: LEFT ELBOW - COMPLETE 3+ VIEW COMPARISON:  None FINDINGS: No evidence of fracture of the ulna or humerus. The radial head is normal. No joint effusion. IMPRESSION: No fracture or dislocation. Electronically Signed   By: Suzy Bouchard M.D.   On: 11/24/2019 09:36   DG Wrist Complete Left  Result Date: 11/24/2019 CLINICAL DATA:  [place] while working in the yard this morning, severe pain LEFT hand, LEFT wrist and LEFT elbow EXAM: LEFT WRIST - COMPLETE 3+ VIEW COMPARISON:  07/12/2018 FINDINGS: Osseous demineralization. Oblique fracture distal LEFT ulnar metadiaphysis, minimally displaced and angulated. Transverse fracture distal LEFT radial metaphysis, mildly displaced and demonstrating mild apex volar angulation. No additional fracture or dislocation. Degenerative changes first CMC joint. IMPRESSION: Distal LEFT radial and ulnar fractures as above. Electronically Signed   By: Lavonia Dana M.D.   On: 11/24/2019 09:36   DG Hand Complete Left  Result Date: 11/24/2019 CLINICAL DATA:  Left hand, wrist and elbow pain status post fall while working in yard. EXAM: LEFT HAND - COMPLETE 3+ VIEW COMPARISON:  None. FINDINGS: Patient refused removal of wrist splint. Overlying splinting material diminishes bone detail. Diffuse osteopenia. Dorsal soft tissue swelling. Mildly impacted transverse fracture deformity involving the distal radius is identified. Mild dorsal angulation of the distal fracture fragments. A second obliquely oriented fracture involves the distal shaft of the ulna. IMPRESSION: 1. Acute, mildly impacted transverse fracture of the distal radius. 2. Nondisplaced obliquely oriented fracture involves the distal shaft of the ulna. Electronically Signed   By: Kerby Moors M.D.   On: 11/24/2019 09:40    Procedures Procedures (including  critical care time)  Medications Ordered in ED Medications  0.9 %  sodium chloride infusion ( Intravenous Stopped 11/24/19 1055)  HYDROcodone-acetaminophen (NORCO/VICODIN) 5-325 MG per tablet 1 tablet (has no administration in time range)  fentaNYL (SUBLIMAZE) injection 50 mcg ( Intravenous Not Given 11/24/19 0901)  ondansetron (ZOFRAN) injection 4 mg (4 mg Intravenous Given 11/24/19 1107)  fentaNYL (SUBLIMAZE) injection 50 mcg (50 mcg Intravenous Given 11/24/19 1107)    ED Course  I have reviewed the triage vital signs and the nursing notes.  Pertinent labs & imaging results that were available during my care of the patient were reviewed by me and considered in my medical decision making (see chart for details).  Clinical Course as of Nov 24 1146  Mon Nov 24, 2019  1009 Slightly impacted and angulated distal radius fracture and longitudinal fracture of the distal ulna.  No other fracture requires reduction, interpreted by me  DG Wrist Complete Left [EW]  1010 No fracture interpreted by me  DG Elbow Complete Left [EW]  1010 No fracture, interpreted by me  DG Hand Complete Left [EW]    Clinical Course User Index [EW] Daleen Bo, MD   MDM Rules/Calculators/A&P                       Patient Vitals for the past 24 hrs:  BP Temp Temp src Pulse Resp SpO2 Height Weight  11/24/19 1130 (!) 156/81 - - (!) 56 - 93 % - -  11/24/19 1122 (!) 141/68 98 F (36.7 C) Oral 79 18 91 % - -  11/24/19 0817 (!) 185/94 97.7 F (36.5 C) Oral 72 18 98 % 5\' 2"  (1.575 m) 43.5  kg    11:42 AM Reevaluation with update and discussion. After initial assessment and treatment, an updated evaluation reveals she is more comfortable in the splint.  Findings discussed with the patient and her grandson at the bedside, all questions answered. Daleen Bo   Medical Decision Making:  This patient is presenting for evaluation of mechanical fall, which does require a range of treatment options, and is a complaint  that involves a moderate risk of morbidity and mortality. The differential diagnoses include fracture, contusion, sprain. I decided to review old records, and in summary elderly female, with mechanical fall, isolated injury to left wrist..  I did not require additional historical information from anyone.   Radiologic Tests Ordered, included radiographs of left elbow, left wrist and left hand.  I independently Visualized: Radiographs images, which show impacted slightly fracture distal radius and distal ulna      Critical Interventions-patient presenting for fall require clinical evaluation, imaging, observation, treatment with narcotic analgesia and reassessment.  Splinting ordered and evaluated, by me.  Neurovascular intact following placement of splint by nursing  After These Interventions, the Patient was reevaluated and was found stable for discharge with the above treatment.  CRITICAL CARE-no Performed by: Daleen Bo  Nursing Notes Reviewed/ Care Coordinated Applicable Imaging Reviewed Interpretation of Laboratory Data incorporated into ED treatment  The patient appears reasonably screened and/or stabilized for discharge and I doubt any other medical condition or other [facility] requiring further screening, evaluation, or treatment in the ED at this time prior to discharge.  Plan: Home Medications-continue routine medication; Home Treatments-elevate left wrist and use ice on it for 2 days.; return here if the recommended treatment, does not improve the symptoms; Recommended follow up-orthopedic follow-up 3 days.     Final Clinical Impression(s) / ED Diagnoses Final diagnoses:  Closed fracture of distal end of left radius, unspecified fracture morphology, initial encounter  Other closed fracture of distal end of left ulna, initial encounter    Rx / DC Orders ED Discharge Orders         Ordered    HYDROcodone-acetaminophen (NORCO) 5-325 MG tablet  Every 4 hours PRN      11/24/19 1146           Daleen Bo, MD 11/25/19 1947

## 2019-11-24 NOTE — Discharge Instructions (Signed)
You have fractures of your distal radius and ulna, they will require treatment by your orthopedist.  Please call them for follow-up appointment.  Elevate your left wrist above your heart is much as possible both day and night.  Use ice on the sore area for 1 hour 4 or 5 times each day.  Use the arm sling as needed for comfort when you are walking.  Be careful, when taking the pain reliever, because it can make you dizzy.  Also, take a stool softener like Colace, once or twice a day while you are taking the pain medicine.

## 2019-11-24 NOTE — Telephone Encounter (Signed)
Spoke w/ patient, patient notified of cancelled appointment. Patient advised to call office back when she is ready to reschedule appointment.

## 2019-11-24 NOTE — Telephone Encounter (Signed)
FYI Pts niece called in to get an referral per the hospital due to her having several broken bones in her L hand due to a fall that she had this morning 7:00 a.m.  Niece is aware that Tommi Rumps and CMA is not in the office today and will return on Tuesday.  The niece stated that she will wait on [facility] to call her back.

## 2019-11-24 NOTE — ED Triage Notes (Signed)
Pt was leaning in ditch with hedge trimmer and fell and injured left arm.

## 2019-11-25 DIAGNOSIS — S52532A Colles' fracture of left radius, initial encounter for closed fracture: Secondary | ICD-10-CM | POA: Diagnosis not present

## 2019-11-25 NOTE — Telephone Encounter (Signed)
ER sent referral to [facility]

## 2019-11-25 NOTE — Telephone Encounter (Signed)
[name] was informed of the info [name] stated below and [name] stated that [facility] messed up and they did not need a referral due to ER sending the referral. They saw her today at 10:15 and put her L wrist in the cast for 4-6 wk and goes back next Friday to re-xray and if needed later they will do surgery but wanted to do the cast first. [name] broke both wrist bones.  [name] wanted me to send as FYI and thanks [facility] for calling her back.

## 2019-11-25 NOTE — Telephone Encounter (Signed)
Left a message for a return call from [name].  DPR on file.

## 2019-11-27 ENCOUNTER — Ambulatory Visit: Payer: 59 | Admitting: Adult Health

## 2019-12-04 ENCOUNTER — Ambulatory Visit (HOSPITAL_COMMUNITY): Payer: Medicare Other

## 2019-12-05 ENCOUNTER — Other Ambulatory Visit: Payer: Self-pay | Admitting: Family Medicine

## 2019-12-05 DIAGNOSIS — M25532 Pain in left wrist: Secondary | ICD-10-CM | POA: Diagnosis not present

## 2019-12-05 DIAGNOSIS — S52532A Colles' fracture of left radius, initial encounter for closed fracture: Secondary | ICD-10-CM | POA: Diagnosis not present

## 2019-12-05 NOTE — Telephone Encounter (Signed)
Spoke to the pt.  She informed me that she has an appointment with ortho today.  She mostly takes Tylenol but does have some breakthrough pain.  Instructed her to discuss medication/pain management with ortho.  Will deny this request.  Nothing further needed.

## 2019-12-05 NOTE — Telephone Encounter (Signed)
LMOM for a return call.  

## 2019-12-05 NOTE — Telephone Encounter (Signed)
Ortho should be filling this.   If she needs a short course to get her through the weekend I can send it in but unable to do more than 3 days worth

## 2019-12-05 NOTE — Telephone Encounter (Signed)
Routed to pcp cma

## 2019-12-11 ENCOUNTER — Ambulatory Visit: Payer: Medicare Other | Admitting: Internal Medicine

## 2019-12-12 ENCOUNTER — Telehealth: Payer: Self-pay | Admitting: Internal Medicine

## 2019-12-12 NOTE — Telephone Encounter (Signed)
I attempted to contact patient on 12/12/19 to schedule 6 mos follow up visit with Dr. Debara Pickett from the recall list. The patient didn't answer so I left voicemail for patient to return call to get that appointment scheduled.

## 2019-12-24 ENCOUNTER — Other Ambulatory Visit: Payer: Self-pay | Admitting: Adult Health

## 2019-12-24 ENCOUNTER — Other Ambulatory Visit: Payer: Self-pay | Admitting: Internal Medicine

## 2019-12-24 DIAGNOSIS — Z760 Encounter for issue of repeat prescription: Secondary | ICD-10-CM

## 2019-12-29 NOTE — Telephone Encounter (Signed)
Pt is calling in stating that she has been out of her bed time medication temazepam 15 MG since last week and would like to see if it can be called in today so that she can get some rest.   Pharm:  [facility] in [place], [place]

## 2020-01-07 DIAGNOSIS — M79642 Pain in left hand: Secondary | ICD-10-CM | POA: Diagnosis not present

## 2020-01-07 DIAGNOSIS — M25632 Stiffness of left wrist, not elsewhere classified: Secondary | ICD-10-CM | POA: Diagnosis not present

## 2020-01-07 DIAGNOSIS — R202 Paresthesia of skin: Secondary | ICD-10-CM | POA: Diagnosis not present

## 2020-01-07 DIAGNOSIS — M25642 Stiffness of left hand, not elsewhere classified: Secondary | ICD-10-CM | POA: Diagnosis not present

## 2020-01-07 DIAGNOSIS — S52532D Colles' fracture of left radius, subsequent encounter for closed fracture with routine healing: Secondary | ICD-10-CM | POA: Diagnosis not present

## 2020-01-07 DIAGNOSIS — M25532 Pain in left wrist: Secondary | ICD-10-CM | POA: Diagnosis not present

## 2020-01-12 DIAGNOSIS — M25632 Stiffness of left wrist, not elsewhere classified: Secondary | ICD-10-CM | POA: Diagnosis not present

## 2020-01-12 DIAGNOSIS — M25642 Stiffness of left hand, not elsewhere classified: Secondary | ICD-10-CM | POA: Diagnosis not present

## 2020-01-12 DIAGNOSIS — S52532D Colles' fracture of left radius, subsequent encounter for closed fracture with routine healing: Secondary | ICD-10-CM | POA: Diagnosis not present

## 2020-01-12 DIAGNOSIS — M25532 Pain in left wrist: Secondary | ICD-10-CM | POA: Diagnosis not present

## 2020-01-12 DIAGNOSIS — R202 Paresthesia of skin: Secondary | ICD-10-CM | POA: Diagnosis not present

## 2020-01-12 DIAGNOSIS — M79642 Pain in left hand: Secondary | ICD-10-CM | POA: Diagnosis not present

## 2020-01-14 DIAGNOSIS — R202 Paresthesia of skin: Secondary | ICD-10-CM | POA: Diagnosis not present

## 2020-01-14 DIAGNOSIS — S52532D Colles' fracture of left radius, subsequent encounter for closed fracture with routine healing: Secondary | ICD-10-CM | POA: Diagnosis not present

## 2020-01-14 DIAGNOSIS — M25642 Stiffness of left hand, not elsewhere classified: Secondary | ICD-10-CM | POA: Diagnosis not present

## 2020-01-14 DIAGNOSIS — M25532 Pain in left wrist: Secondary | ICD-10-CM | POA: Diagnosis not present

## 2020-01-14 DIAGNOSIS — M25632 Stiffness of left wrist, not elsewhere classified: Secondary | ICD-10-CM | POA: Diagnosis not present

## 2020-01-14 DIAGNOSIS — M79642 Pain in left hand: Secondary | ICD-10-CM | POA: Diagnosis not present

## 2020-01-19 DIAGNOSIS — M25642 Stiffness of left hand, not elsewhere classified: Secondary | ICD-10-CM | POA: Diagnosis not present

## 2020-01-19 DIAGNOSIS — R202 Paresthesia of skin: Secondary | ICD-10-CM | POA: Diagnosis not present

## 2020-01-19 DIAGNOSIS — M25632 Stiffness of left wrist, not elsewhere classified: Secondary | ICD-10-CM | POA: Diagnosis not present

## 2020-01-19 DIAGNOSIS — M25532 Pain in left wrist: Secondary | ICD-10-CM | POA: Diagnosis not present

## 2020-01-19 DIAGNOSIS — S52532D Colles' fracture of left radius, subsequent encounter for closed fracture with routine healing: Secondary | ICD-10-CM | POA: Diagnosis not present

## 2020-01-19 DIAGNOSIS — M79642 Pain in left hand: Secondary | ICD-10-CM | POA: Diagnosis not present

## 2020-01-21 DIAGNOSIS — M25642 Stiffness of left hand, not elsewhere classified: Secondary | ICD-10-CM | POA: Diagnosis not present

## 2020-01-21 DIAGNOSIS — M25532 Pain in left wrist: Secondary | ICD-10-CM | POA: Diagnosis not present

## 2020-01-21 DIAGNOSIS — S52532D Colles' fracture of left radius, subsequent encounter for closed fracture with routine healing: Secondary | ICD-10-CM | POA: Diagnosis not present

## 2020-01-21 DIAGNOSIS — R202 Paresthesia of skin: Secondary | ICD-10-CM | POA: Diagnosis not present

## 2020-01-21 DIAGNOSIS — M25632 Stiffness of left wrist, not elsewhere classified: Secondary | ICD-10-CM | POA: Diagnosis not present

## 2020-01-21 DIAGNOSIS — M79642 Pain in left hand: Secondary | ICD-10-CM | POA: Diagnosis not present

## 2020-01-23 DIAGNOSIS — M25532 Pain in left wrist: Secondary | ICD-10-CM | POA: Diagnosis not present

## 2020-01-23 DIAGNOSIS — M25642 Stiffness of left hand, not elsewhere classified: Secondary | ICD-10-CM | POA: Diagnosis not present

## 2020-01-23 DIAGNOSIS — S52532D Colles' fracture of left radius, subsequent encounter for closed fracture with routine healing: Secondary | ICD-10-CM | POA: Diagnosis not present

## 2020-01-23 DIAGNOSIS — R202 Paresthesia of skin: Secondary | ICD-10-CM | POA: Diagnosis not present

## 2020-01-23 DIAGNOSIS — M25632 Stiffness of left wrist, not elsewhere classified: Secondary | ICD-10-CM | POA: Diagnosis not present

## 2020-01-23 DIAGNOSIS — M79642 Pain in left hand: Secondary | ICD-10-CM | POA: Diagnosis not present

## 2020-01-26 DIAGNOSIS — M79642 Pain in left hand: Secondary | ICD-10-CM | POA: Diagnosis not present

## 2020-01-26 DIAGNOSIS — R202 Paresthesia of skin: Secondary | ICD-10-CM | POA: Diagnosis not present

## 2020-01-26 DIAGNOSIS — M25632 Stiffness of left wrist, not elsewhere classified: Secondary | ICD-10-CM | POA: Diagnosis not present

## 2020-01-26 DIAGNOSIS — M25532 Pain in left wrist: Secondary | ICD-10-CM | POA: Diagnosis not present

## 2020-01-26 DIAGNOSIS — S52532D Colles' fracture of left radius, subsequent encounter for closed fracture with routine healing: Secondary | ICD-10-CM | POA: Diagnosis not present

## 2020-01-26 DIAGNOSIS — M25642 Stiffness of left hand, not elsewhere classified: Secondary | ICD-10-CM | POA: Diagnosis not present

## 2020-01-28 DIAGNOSIS — M79642 Pain in left hand: Secondary | ICD-10-CM | POA: Diagnosis not present

## 2020-01-28 DIAGNOSIS — M25642 Stiffness of left hand, not elsewhere classified: Secondary | ICD-10-CM | POA: Diagnosis not present

## 2020-01-28 DIAGNOSIS — M25632 Stiffness of left wrist, not elsewhere classified: Secondary | ICD-10-CM | POA: Diagnosis not present

## 2020-01-28 DIAGNOSIS — R202 Paresthesia of skin: Secondary | ICD-10-CM | POA: Diagnosis not present

## 2020-01-28 DIAGNOSIS — S52532D Colles' fracture of left radius, subsequent encounter for closed fracture with routine healing: Secondary | ICD-10-CM | POA: Diagnosis not present

## 2020-01-28 DIAGNOSIS — M25532 Pain in left wrist: Secondary | ICD-10-CM | POA: Diagnosis not present

## 2020-01-30 ENCOUNTER — Other Ambulatory Visit: Payer: Self-pay | Admitting: Adult Health

## 2020-01-30 DIAGNOSIS — S52532A Colles' fracture of left radius, initial encounter for closed fracture: Secondary | ICD-10-CM | POA: Diagnosis not present

## 2020-01-30 DIAGNOSIS — M25532 Pain in left wrist: Secondary | ICD-10-CM | POA: Diagnosis not present

## 2020-01-30 NOTE — Telephone Encounter (Signed)
last filled 09/17/2019 Last OV 10/28/2019  Ok to fill?

## 2020-02-02 DIAGNOSIS — D6869 Other thrombophilia: Secondary | ICD-10-CM | POA: Diagnosis not present

## 2020-02-02 DIAGNOSIS — I4891 Unspecified atrial fibrillation: Secondary | ICD-10-CM | POA: Diagnosis not present

## 2020-02-02 DIAGNOSIS — M79602 Pain in left arm: Secondary | ICD-10-CM | POA: Diagnosis not present

## 2020-03-01 DIAGNOSIS — G90512 Complex regional pain syndrome I of left upper limb: Secondary | ICD-10-CM | POA: Diagnosis not present

## 2020-03-01 DIAGNOSIS — M79602 Pain in left arm: Secondary | ICD-10-CM | POA: Diagnosis not present

## 2020-03-11 ENCOUNTER — Other Ambulatory Visit: Payer: Self-pay

## 2020-03-11 ENCOUNTER — Ambulatory Visit (INDEPENDENT_AMBULATORY_CARE_PROVIDER_SITE_OTHER): Payer: Medicare Other | Admitting: Internal Medicine

## 2020-03-11 ENCOUNTER — Encounter: Payer: Self-pay | Admitting: Internal Medicine

## 2020-03-11 ENCOUNTER — Telehealth: Payer: Self-pay | Admitting: Adult Health

## 2020-03-11 VITALS — BP 110/62 | HR 84 | Temp 97.2°F | Ht 62.0 in | Wt 100.0 lb

## 2020-03-11 DIAGNOSIS — I2510 Atherosclerotic heart disease of native coronary artery without angina pectoris: Secondary | ICD-10-CM | POA: Diagnosis not present

## 2020-03-11 DIAGNOSIS — R5383 Other fatigue: Secondary | ICD-10-CM | POA: Diagnosis not present

## 2020-03-11 DIAGNOSIS — I4819 Other persistent atrial fibrillation: Secondary | ICD-10-CM

## 2020-03-11 DIAGNOSIS — I200 Unstable angina: Secondary | ICD-10-CM | POA: Diagnosis not present

## 2020-03-11 NOTE — Telephone Encounter (Signed)
Pt is calling to get some advise about a medical procedure coming up and she would like to talk to [facility] about it.

## 2020-03-11 NOTE — Patient Instructions (Signed)
Medication Instructions:  No changes *If you need a refill on your cardiac medications before your next appointment, please call your pharmacy*   Lab Work: Not needed If you have labs (blood work) drawn today and your tests are completely normal, you will receive your results only by: [name] MyChart Message (if you have MyChart) OR . A paper copy in the mail If you have any lab test that is abnormal or we need to change your treatment, we will call you to review the results.   Testing/Procedures: Not needed   Follow-Up: At [facility], you and your health needs are our priority.  As part of our continuing mission to provide you with exceptional heart care, we have created designated Provider Care Teams.  These Care Teams include your primary Cardiologist (physician) and Advanced Practice Providers (APPs -  Physician Assistants and Nurse Practitioners) who all work together to provide you with the care you need, when you need it.    Your next appointment:   6 month(s)  The format for your next appointment:   In Person  Provider:   K. Mali Hilty, MD

## 2020-03-11 NOTE — Progress Notes (Signed)
OFFICE NOTE  Chief Complaint:  Follow-up fatigue  Primary Care Physician: Dorothyann Peng, NP  HPI:  [name] is a pleasant 79 year old female kindly referred to me be Dr. Ardeth Perfect. She reports a past medical history significant for atrial fibrillation and congestive heart failure, which occurred around the same time and probably 2012. She vaguely recalls seeing Dr. Irish Lack at that time.  She was started on warfarin therapy and no attempts were made to get her back into rhythm as she was paroxysmal. She does has a long-standing history of hypertension and dyslipidemia. She apparently developed heart failure or however has never had reassessment of her ejection fraction. She subsequently was having health issues and decided to move with her son to live in [place]. She says at that time she had some problems with chest pain and ultimately underwent a stress test. This was associated with an anaphylactic type reaction and despite their reassurances she will "no longer have any more stress test". She says she has a severe allergy to IV contrast dye which causes throat swelling as well as milk which also causes the same symptoms. Apparently she also underwent cardiac catheterization however she says that only medical therapy was recommended. This was in [place], [place].  We are trying to request records from there as well. She subsequently moved with her son to [facility], however he lost his job and she recently moved back to [place]. Currently she denies any significant shortness of breath but does report some mild leg edema which is slightly worse and occasional pain in her left chest and left arm over the last several days. She's also had pain in the right chest. None of those symptoms are worse with exertion or relieved by rest or medication.   [name] returns today for followup. She again appears upset and quite nervous. Blood pressure recently has been significantly elevated. In  fact she went to the emergency room and was hospitalized for blood pressure control. Although it seems that her blood pressure more easily was controlled once her oral medicines were restarted. Subsequently she's been started on clonidine is now up to twice a day. Blood pressure today was still high at 035 systolic. She reports some occasional chest discomfort, mostly at rest and reports not being active enough to know if she has symptoms with exertion. She also has pain all over body significant low back pain and is seeing a specialist about that in the next few weeks.  I saw [name] back in the office today. She reports some improvement in her chest discomfort and I think this is related to blood pressure. The recent switch in her blood pressure medicines indicate a marked improvement in her pressures in she is noted to be 130/66 today. She does get some slight shortness of breath with exertion and no regular chest discomfort. At this time there is not clear evidence to pursue a further workup although I have a low threshold for further cardiac workup if she should become more symptomatic. She is in sinus rhythm and seems to be maintaining that.  [name] returns today for follow-up. She's had a remarkable past 6 months. Unfortunately she was diagnosed with a gastric cancer which was considered very high risk to resect. She was referred to [facility] for an endoscopic resection which was successful with clean margins. Prior to that she underwent cardiac evaluation by [facility] cardiology including a nuclear stress test which was low risk and didn't involve  exercise. She reports that during the last minute of exercise she went into A. fib. She's had recurrent A. fib on and off and in fact was just hospitalized again for A. fib a few weeks ago which converted spontaneously back to sinus rhythm, prior to cardiology is evaluating her. She was then directed to follow-up with me. She has fortunately been on  warfarin with therapeutic INRs. Of note, I did review her cardiac catheterization which was performed at an outside hospital 2014 which did demonstrate an 80% mid RCA stenosis as well as a 70% OM lesion and some mild to moderate disease of other vessels. At the time no stent was recommended, which leads me to believe these may be smaller vessels. The fact that her most recent stress test was again negative suggest that they are not flow limiting even though they would seem significant. Also, today she is describing left hand pain. She says that there is exquisite tenderness the left hand and it's difficult for her to extend her finger. She often notes that her left middle finger gets stuck. On palpation I do not notice any significant arthritis of the joints or nodules on the tendons.  04/19/2016  [name] returns today for follow-up. She is concerned about shortness of breath which is been progressive. She reports recently she's had some loss of vocal power and noted some upper airway wheezing. We and related with her on the office today and her oxygen saturations were noted to be 97% at rest and that came down to 95% with ambulation. EKG shows sinus rhythm at 66. Blood pressure is mildly elevated today. She has a nonproductive cough but denies fever, chills or sweats. Weight is up to 141 from 130 pounds 6 months ago.  07/11/2015  [name] returns today for follow-up. She seems to be doing fairly well. She denies any worsening shortness of breath or chest pain. She seems to be suffering from some acid reflux and will likely be undergoing another EGD with Dr. Benson Norway. She continues to have problems with voice changes which could be due to vocal cord inflammation related to her reflux.  09/21/2016  [name] is here today for follow-up. She had an INR check in the office. She is scheduled to have upcoming surgery for large hiatal hernia. Warm to [facility] by Dr. Precious Bard. Risk factors for surgery  including aortic stenosis, however this is recently assessed and is moderate. She denies any chest pain or anginal symptoms.  11/02/2016  [name] returns for follow-up today. Recently she's been having uncontrolled hypertension. 5 blood pressures been over 119 systolic. She's not had a history with difficult to control hypertension in the past. He's also had associated chest discomfort. The other day she had severe chest discomfort and she has had headaches. In 2013 she underwent cardia catheterization in [place] which showed some moderate coronary disease including what appears to be an 80% stenosis of the RCA however was not treated at the time. She also had proximal LAD disease which was mild to moderate. I'm concerned that she's had progression of her coronary artery disease as a possible cause of her uncontrolled hypertension. She reports the pain has occurred at rest but can occur with exertion. She also gets short of breath with exertion. She's felt more fatigue as well. The pain is described as sharp and nonradiating in the central chest. Unfortunate she's on warfarin for PAF and will need to hold that for 5 days prior to the  procedure to safely undergo cardiac catheterization. Although he recently cleared her for hernia repair, she never underwent the surgery as she was felt to be at higher risk. Given this new information I would not recommend hernia surgery in the near future until we have a better idea of her coronary arteries.  11/16/2016  [name] returns for follow-up. She underwent heart catheterization by Dr. Ellyn Hack on 11/08/2016 which showed hyperdynamic LVEF greater than 65%, there was a moderate 60% ramus lesion and 60% RPDA lesion which was not thought to be significant. There was mild aortic valvular stenosis. Blood pressure was elevated. Medical therapy including increased blood pressure control was recommended. Since discharge she has done well. She denied any competitions from the  right femoral access site. She's had one or 2 brief episodes of chest discomfort. Unfortunately after I saw her in the office at the last visit she fell in the parking lot by the [employer] caf[employer]ia. Ultimately she fractured her right radial styloid. That is now casted and in a sling. She does not anticipate the need for surgery, but she would be considered acceptable risk for surgery. Blood pressure is improved however not yet at goal.  02/19/2017  [name] is seen today in follow-up. She is doing well other than painful osteoarthritis. Her blood pressures been better controlled although she is in need of refills. She was found to have moderate aortic stenosis by last echo however was only noted to be mild by cardiac catheterization. She'll need a repeat echo in about 6 months.  09/10/2016  [name] returns today for follow-up.  She is under a lot of stress today.  Her sister is in the hospital and had a repeat heart surgery.  [name] his blood pressure is much better controlled today 114/60.  Overall she feels well and denies chest pain or worsening shortness of breath.  As noted she did have moderate coronary artery disease and even some severe distal coronary disease managed medically by heart cath in 2014.  Recently she had a lipid profile last week showing total cholesterol 182, triglycerides 109, HDL 54, LDL 107.  She is on simvastatin 20 mg.  She does have moderate aortic stenosis by echo in 05/2016.  03/20/2018  [name] is seen today in follow-up.  She has multiple complaints today including significant chest and back pain, weakness, fatigue.  Work-up recently including hospitalization demonstrated some acute heart failure with LVEF 50 to 55%.  She has permanent A. fib at this point and is not likely that that is the main cause of her weakness.  She was diuresed and is also noted to have mild to moderate aortic stenosis with a mean gradient of 14 mmHg in August.  Again I do not think  this is likely the cause of her multiple somatic complaints.  She does have diffuse arthritis and neuropathy and is on medication for that.  She may very well need pain management.  12/25/2018  [name] is seen today in follow-up.  She is done fairly well over the past 6 months.  Her echo last year showed stable LVEF 50 to 55% with mild to moderate aortic stenosis.  She still has complaints of generalized pain.  She denies any congestive heart failure symptoms.  She does note that she goes in and out of A. fib however is not worsened with her symptoms regarding that.  Her INRs have been therapeutic on warfarin.  She is overdue for lipid profile.  Blood pressure also has been poorly controlled at home in general 161-096 systolic.  04/54/0981  [name] is seen today in follow-up.  Unfortunately she had a stroke in September.  She is now back in A. fib.  She was converted from warfarin to Eliquis.  Since then she has been significantly fatigued with no energy.  She still remains borderline underweight with a BMI of 18 at 102 pounds.  She was seen by Kerin Ransom, PA-C, who noted she was still in persistent A. fib but rate controlled.  He adjusted her medicines further.  Today her heart rhythm is irregular on exam.  Heart rate in the 50s.  I suspect this is persistent A. fib.  She has now been on anticoagulation for more than 3 weeks, actually more than 2 months, and we discussed the possibility of an elective cardioversion.  08/14/2019  [name] returns today for follow-up.  Recently she was seen in the office by Levell July, NP, who was newly hired.  She had recently underwent cardioversion by myself but then had complained of some chest pain.  In fact prior to her cardioversion she had a significant episode of chest pain which she did not initially elaborate on, associated with pain in the left chest, down her left arm and there was nausea and diaphoresis.  She did successfully convert to sinus rhythm  however reverted back to A. fib within about 2 weeks.  During that time when she was in sinus she does say that she felt better.  On follow-up in the office on January 26, she was noted to be in A. fib and felt to be having symptoms of unstable angina.  There was discussion saying that I had recommended the pacemaker for her A. fib, but I had never made that recommendation.  In fact there is a misconception that a pacemaker regulates atrial fibrillation but I cleared this up for her.  It was recommended that she start isosorbide dinitrate twice daily.  Unfortunately, her interaction the office was not pleasant and she never started the isosorbide.  She claims that she reached out to her pharmacy however they said they could not get the medication, probably because this needs prior authorization.  She does have known coronary disease with moderate two-vessel disease by cath in May 2018.  It is possible that she has had recurrent A. fib due to ischemia.  EKG today shows A. fib at 82 without obvious ischemic changes.  09/12/2019  [name] is seen today for follow-up.  She underwent left heart cath on 08/19/2019 and was found to have moderate two-vessel coronary disease with a 70% stenosis in the ramus branch and 60% mid PDA stenosis.  Neither was significant or significantly changed compared to prior cath in 2018.  Medical therapy was recommended.  She still says she feels poorly.  She seems to have a fairly flat affect and may be depressed.  According to family she has lost significant weight.  She is lost another 5 pounds over the past few weeks.  She said food does not taste good but her appetite is poor.  03/11/2020  Mrs. Burress is seen today in follow-up.  Unfortunately in May she had a fall with a left Colles' fracture.  This has been slow to heal and she has been left with significant pain and a complex regional pain syndrome.  She was referred to [facility] to see Dr. Nelva Bush for possible nerve block.  He  did send a letter  in early August and said that if she could come off of her anticoagulation he be willing to do that.  She was concerned about the procedure.  Since then she had been placed on medications for pain including hydrocodone and tramadol.  Today she appears to be significantly sedate, her speech is somewhat slurred and vocal power is weak.  She is almost falling asleep on the table.  She says her quality of life has been fairly poor without being able to do very much since starting on pain medication.  PMHx:  Past Medical History:  Diagnosis Date  . Anemia 2005   Generally microcytic, transfusions in [address], 2012, 02/2015, 05/2015  . Aortic stenosis    Moderate November 2017  . Arthritis   . Broken back 2013   Chronic back pain.   [name] CAD (coronary artery disease)    Cardiac catheterization 2014 - 80% mid RCA and 70% OM managed medically  . CHF (congestive heart failure) ([place])   . Chronic bilateral pleural effusions   . Chronic headache   . Contrast media allergy   . Diastolic heart failure ([place])   . Diverticulosis   . Esophageal cancer ([place]) 05/06/2015   Adenocarcinoma GE junction  . Facial paresthesia   . GERD (gastroesophageal reflux disease)   . H. pylori infection   . H/O iron deficiency    05-06-15 iron infusion ([facility])  . HH (hiatus hernia) 2008   Large with associated erosions  . Hypertension   . Paroxysmal atrial fibrillation ([name])   . PONV (postoperative nausea and vomiting)   . Pulmonary emboli ([place]) 2008, 2012  . Stroke ([place]) 1982  . Swallowing dysfunction     Past Surgical History:  Procedure Laterality Date  . APPENDECTOMY  1950s  . CARDIOVERSION N/A 07/08/2019   Procedure: CARDIOVERSION;  Surgeon: Pixie Casino, MD;  Location: [facility];  Service: Cardiovascular;  Laterality: N/A;  . CARPAL TUNNEL RELEASE Bilateral 1990s  . [name] SURGERY  2014  . CHOLECYSTECTOMY  1980s   open  . COLONOSCOPY N/A 02/17/2015   Procedure: COLONOSCOPY;   Surgeon: [name], MD;  Location: [facility] ENDOSCOPY;  Service: Endoscopy;  Laterality: N/A;  . ESOPHAGOGASTRODUODENOSCOPY N/A 02/16/2015   Procedure: ESOPHAGOGASTRODUODENOSCOPY (EGD);  Surgeon: [name], MD;  Location: [facility];  Service: Endoscopy;  Laterality: N/A;  . ESOPHAGOGASTRODUODENOSCOPY N/A 05/06/2015   Procedure: ESOPHAGOGASTRODUODENOSCOPY (EGD);  Surgeon: [name], MD;  Location[facility] ENDOSCOPY;  Service: Endoscopy;  Laterality: N/A;  . EUS N/A 05/20/2015   Procedure: UPPER ENDOSCOPIC ULTRASOUND (EUS) LINEAR;  Surgeon: [name], MD;  Location: [facility] ENDOSCOPY;  Service: Endoscopy;  Laterality: N/A;  . exploratory lab  1950s or 1960s  . GIVENS CAPSULE STUDY N/A 05/06/2015   Procedure: GIVENS CAPSULE STUDY;  Surgeon: [name], MD;  Location: [facility] ENDOSCOPY;  Service: Endoscopy;  Laterality: N/A;  . KNEE ARTHROSCOPY WITH MEDIAL MENISECTOMY Left 04/18/2018   Procedure: LEFT KNEE ARTHROSCOPY WITH PARTIAL MEDIAL MENISECTOMY AND ANTERIOR CRUCIATE LIGAMENT DEBRIDEMENT;  Surgeon: [name], MD;  Location: [facility];  Service: Orthopedics;  Laterality: Left;  . LEFT HEART CATH AND CORONARY ANGIOGRAPHY N/A 11/08/2016   Procedure: Left Heart Cath and Coronary Angiography;  Surgeon: [name], MD;  Location: [facility] CV LAB;  Service: Cardiovascular;  Laterality: N/A;  . LEFT HEART CATH AND CORONARY ANGIOGRAPHY N/A 08/19/2019   Procedure: LEFT HEART CATH AND CORONARY ANGIOGRAPHY;  Surgeon: Martinique, Peter M, MD;  Location: [facility] INVASIVE CV  LAB;  Service: Cardiovascular;  Laterality: N/A;  . lumbar back surgery  2012  . [place] SURGERY  1991  . TONSILLECTOMY AND ADENOIDECTOMY  1960s    FAMHx:  Family History  Problem Relation Age of Onset  . Stroke Mother   . Heart disease Mother   . Emphysema Father   . Liver disease Sister   . Ovarian cancer Sister   . Stroke Sister   . Other Child        died at birth  . Colon cancer Neg Hx   . Esophageal cancer Neg  Hx   . Stomach cancer Neg Hx   . Pancreatic cancer Neg Hx     SOCHx:   reports that she has never smoked. She has never used smokeless tobacco. She reports that she does not drink alcohol and does not use drugs.  ALLERGIES:  Allergies  Allergen Reactions  . Iodinated Diagnostic Agents Anaphylaxis and Other (See Comments)    IPD dye Info given by patient  . Iodine Anaphylaxis  . Ioxaglate Anaphylaxis and Other (See Comments)  . Lactose Intolerance (Gi) Anaphylaxis  . Red Dye Anaphylaxis  . Hydralazine Anxiety and Other (See Comments)    Facial flushing, pt prefers not to use it.   . Milk-Related Compounds Other (See Comments)    Lactose intolerance Can tolerate milk if its cooked into the recipe, just can't drink milk   . Propoxyphene Rash and Anxiety    ROS: Pertinent items noted in HPI and remainder of comprehensive ROS otherwise negative.  HOME MEDS: Current Outpatient Medications  Medication Sig Dispense Refill  . acetaminophen (TYLENOL) 500 MG tablet Take 500 mg by mouth daily as needed for mild pain or moderate pain.     [name] amLODipine (NORVASC) 5 MG tablet TAKE (1) TABLET BY MOUTH ONCE DAILY. 90 tablet 1  . apixaban (ELIQUIS) 5 MG TABS tablet Take 1 tablet (5 mg total) by mouth 2 (two) times daily.    [name] atorvastatin (LIPITOR) 40 MG tablet Take 1 tablet (40 mg total) by mouth daily at 6 PM. 90 tablet 3  . buPROPion (WELLBUTRIN XL) 150 MG 24 hr tablet TAKE (1) TABLET BY MOUTH ONCE DAILY. 90 tablet 1  . furosemide (LASIX) 40 MG tablet TAKE 1 TABLET BY MOUTH ONCE DAILY. 90 tablet 1  . HYDROcodone-acetaminophen (NORCO) 5-325 MG tablet Take 1 tablet by mouth every 4 (four) hours as needed for moderate pain. 20 tablet 0  . meclizine (ANTIVERT) 25 MG tablet Take 1 tablet (25 mg total) by mouth as needed for dizziness or nausea. (Patient taking differently: Take 25 mg by mouth 3 (three) times daily as needed for dizziness or nausea. ) 90 tablet 1  . Melatonin 5 MG CAPS Take 10 mg  by mouth at bedtime.    . pantoprazole (PROTONIX) 40 MG tablet TAKE 1 TABLET BY MOUTH TWICE DAILY. 180 tablet 1  . Pediatric Multiple Vitamins (FLINTSTONES MULTIVITAMIN PO) Take 1 tablet by mouth every evening.    . temazepam (RESTORIL) 15 MG capsule TAKE (1) CAPSULE BY MOUTH AT BEDTIME. 30 capsule 2  . traMADol (ULTRAM) 50 MG tablet Take 1 tablet (50 mg total) by mouth every 8 (eight) hours as needed. 90 tablet 0  . carvedilol (COREG) 25 MG tablet Take 1 tablet (25 mg total) by mouth 2 (two) times daily with a meal. 180 tablet 1   No current facility-administered medications for this visit.    LABS/IMAGING: No results found for this or  any previous visit (from the past 48 hour(s)). No results found.  VITALS: BP 110/62   Pulse 84   Temp (!) 97.2 F (36.2 C)   Ht 5\' 2"  (1.575 m)   Wt 100 lb (45.4 kg)   SpO2 96%   BMI 18.29 kg/m   EXAM: General appearance: alert, fatigued and slowed mentation Neck: no carotid bruit and no JVD Lungs: clear to auscultation bilaterally Heart: irregularly irregular rhythm, S1, S2 normal and systolic murmur: midsystolic 3/6, crescendo at 2nd right intercostal space Abdomen: soft, non-tender; bowel sounds normal; no masses,  no organomegaly Extremities: Notable left wrist brace Pulses: 2+ and symmetric Skin: Skin color, texture, turgor normal. No rashes or lesions Neurologic: Grossly normal Psych: Slowed mentation, appears sedate  EKG: A. fib with controlled ventricular response of 84-personally reviewed  ASSESSMENT: 1. Unstable angina - cath showed two-vessel moderate CAD and mild aortic stenosis (10/2016), repeat cath 08/2019 unchanged 2. Recent stroke (affecting bulbar symptoms) 3. Uncontrolled hypertension 4. Paroxysmal atrial fibrillation -on Eliquis, cardioverted in 07/2019, then reverted back to afib 5. History of diastolic congestive heart failure - EF 50-55% on echo 6. Dyslipidemia 7. Moderate aortic stenosis 8. Right hand pain with  contracture 9. Recent GI cancer/resected 10. Weight loss 11. Colles' fracture with CRPS  PLAN: 1.   Mrs. Berwanger unfortunately had a fall with a fracture of her left forearm/wrist and has since had a complex regional pain syndrome.  She is now on some narcotic medications but appears to be overmedicated to me.  Her speech is slowed and she is less animated than normal.  She reports she has been less active as well on pain medicine but has not had adequate relief.  Her orthopedist had recommended an injection which I am in agreement with.  She could hold her Eliquis 3 days prior to the procedure to allow this.  And restart 48 hours afterwards if she is at low bleeding risk.  I did call over to try to speak with her orthopedist however he was not available.  I asked them to contact her back as she is willing to proceed with that procedure.  I think she should have some adjustment in her pain medications as well.  Follow-up in 6 months.   Pixie Casino, MD, [facility] [place] Director of the [facility]  Card[facility] Diplomate of the American Board of Clinical Lipidology Attending Cardiologist  Direct Dial: [phone]  Fax: [phone]  Website:  [email or web address] 03/11/2020, 2:32 PM

## 2020-03-17 DIAGNOSIS — G90519 Complex regional pain syndrome I of unspecified upper limb: Secondary | ICD-10-CM | POA: Diagnosis not present

## 2020-03-23 ENCOUNTER — Telehealth: Payer: Self-pay | Admitting: Adult Health

## 2020-03-23 NOTE — Telephone Encounter (Signed)
Left message for patient to schedule Annual Wellness Visit.  Please schedule with Nurse Health Advisor Shannon Crews, RN at [facility]  

## 2020-03-25 ENCOUNTER — Other Ambulatory Visit: Payer: Self-pay | Admitting: Adult Health

## 2020-03-25 DIAGNOSIS — Z760 Encounter for issue of repeat prescription: Secondary | ICD-10-CM

## 2020-04-08 ENCOUNTER — Ambulatory Visit (INDEPENDENT_AMBULATORY_CARE_PROVIDER_SITE_OTHER): Payer: Medicare Other | Admitting: Adult Health

## 2020-04-08 ENCOUNTER — Encounter: Payer: Self-pay | Admitting: Adult Health

## 2020-04-08 ENCOUNTER — Other Ambulatory Visit: Payer: Self-pay

## 2020-04-08 VITALS — BP 130/70 | HR 56 | Temp 97.5°F | Ht 62.0 in | Wt 101.7 lb

## 2020-04-08 DIAGNOSIS — I2510 Atherosclerotic heart disease of native coronary artery without angina pectoris: Secondary | ICD-10-CM

## 2020-04-08 DIAGNOSIS — I4819 Other persistent atrial fibrillation: Secondary | ICD-10-CM

## 2020-04-08 DIAGNOSIS — I63411 Cerebral infarction due to embolism of right middle cerebral artery: Secondary | ICD-10-CM | POA: Diagnosis not present

## 2020-04-08 DIAGNOSIS — F329 Major depressive disorder, single episode, unspecified: Secondary | ICD-10-CM

## 2020-04-08 DIAGNOSIS — S52532G Colles' fracture of left radius, subsequent encounter for closed fracture with delayed healing: Secondary | ICD-10-CM

## 2020-04-08 DIAGNOSIS — R739 Hyperglycemia, unspecified: Secondary | ICD-10-CM | POA: Diagnosis not present

## 2020-04-08 DIAGNOSIS — Z23 Encounter for immunization: Secondary | ICD-10-CM | POA: Diagnosis not present

## 2020-04-08 DIAGNOSIS — E782 Mixed hyperlipidemia: Secondary | ICD-10-CM | POA: Diagnosis not present

## 2020-04-08 DIAGNOSIS — I10 Essential (primary) hypertension: Secondary | ICD-10-CM

## 2020-04-08 DIAGNOSIS — I200 Unstable angina: Secondary | ICD-10-CM

## 2020-04-08 NOTE — Patient Instructions (Signed)
It was great seeing you today   We will follow up with you regarding your blood work   Someone from home health will call you to schedule their appointment with you

## 2020-04-08 NOTE — Progress Notes (Signed)
Subjective:    Patient ID: [name], female    DOB: 06/03/1941, 79 y.o.   MRN: [id number]  HPI 79 year old female who  has a past medical history of Anemia (2005), Aortic stenosis, Arthritis, Broken back (2013), CAD (coronary artery disease), CHF (congestive heart failure) ([place]), Chronic bilateral pleural effusions, Chronic headache, Contrast media allergy, Diastolic heart failure ([place]), Diverticulosis, Esophageal cancer ([place]) (05/06/2015), Facial paresthesia, GERD (gastroesophageal reflux disease), H. pylori infection, H/O iron deficiency, HH (hiatus hernia) (2008), Hypertension, Paroxysmal atrial fibrillation ([place]), PONV (postoperative nausea and vomiting), Pulmonary emboli ([place]) (2008, 2012), Stroke [facility]) (1982), and Swallowing dysfunction.  She presents to the office today for multiple issues.  In May 2020 she had a fall with a left Colles' fracture. She has been slow to heal since that time and has been left with some significant pain and a complex regional pain syndrome. She has been seen by orthopedics and was referred to see Dr. Herma Mering for possible nerve block. She was also placed on Lyrica 250 mg nightly. She felt as though this dose was too high for her and was making her feel sedated in the morning. She had quit taking the 250 mg and was decreased down to 50 mg. Today she reports that she continues to feel as though this medication is making her sedated and feeling off balance. She is not having much pain any longer and is worried about having the nerve block. She is wondering if she can stop the Lyrica if there is other treatments to help with range of motion in her left hand. She is unable to make a fist and has trouble grasping items around the house. She does wear compression sleeve on her left hand.   She would also like to have some labs done.  She is currently followed by cardiology for multiple cardiac issues including proximal atrial fibrillation is currently on Eliquis and  was cardioverted in January 2021 and then reverted back to A. fib. She has a history of diastolic congestive heart failure with an EF of 50 to 55% on last echo, dyslipidemia, moderate aortic stenosis, and unstable angina which cath showed two-vessel moderate CAD and mild aortic stenosis in May 2018. She did have a repeat cath in 08/2019 that was unchanged. He is currently prescribed Lipitor, Coreg, Norvasc, Lasix.  She was started on Wellbutrin 150 mg daily for depression and feels as though her depression is well controlled on this medication. She does not want to change dosage.  She also takes restoral 15 mg at bedtime for insomnia and feels as though this works well for her.  She is taking tramadol but infrequently for chronic arthritic pain. She also feels as though this helps with her most recent left hand fracture.    Review of Systems  Constitutional: Negative.   HENT: Negative.   Respiratory: Negative.   Cardiovascular: Positive for chest pain (chronic).  Gastrointestinal: Negative.   Genitourinary: Negative.   Musculoskeletal: Positive for arthralgias and back pain.  Skin: Negative.   Neurological: Positive for dizziness, weakness and light-headedness.  Psychiatric/Behavioral: Positive for decreased concentration and sleep disturbance.  All other systems reviewed and are negative.  Past Medical History:  Diagnosis Date   Anemia 2005   Generally microcytic, transfusions in [address], 2012, 02/2015, 05/2015   Aortic stenosis    Moderate November 2017   Arthritis    Broken back 2013   Chronic back pain.    CAD (coronary artery disease)  Cardiac catheterization 2014 - 80% mid RCA and 70% OM managed medically   CHF (congestive heart failure) ([name])    Chronic bilateral pleural effusions    Chronic headache    Contrast media allergy    Diastolic heart failure ([name])    Diverticulosis    Esophageal cancer ([place]) 05/06/2015   Adenocarcinoma GE junction   Facial  paresthesia    GERD (gastroesophageal reflux disease)    H. pylori infection    H/O iron deficiency    05-06-15 iron infusion ([facility])   HH (hiatus hernia) 2008   Large with associated erosions   Hypertension    Paroxysmal atrial fibrillation ([name])    PONV (postoperative nausea and vomiting)    Pulmonary emboli ([place]) 2008, 2012   Stroke [facility]) 1982   Swallowing dysfunction     Social History   Socioeconomic History   Marital status: Widowed    Spouse name: Not on file   Number of children: 3   Years of education: 14   Highest education level: Not on file  Occupational History   Occupation: Retired  Tobacco Use   Smoking status: Never Smoker   Smokeless tobacco: Never Used  Scientific laboratory technician Use: Never used  Substance and Sexual Activity   Alcohol use: No    Alcohol/week: 0.0 standard drinks   Drug use: No   Sexual activity: Not Currently  Other Topics Concern   Not on file  Social History Narrative   Born and raised in [place], [place]. Currently resides in a house with her son. 1 dog. Fun: go to church   Divorced(Has total of #3 children)-[place], [place], [place]   Denies religious beliefs that would effect health care.    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[employer] Strain:    Difficulty of Paying Living Expenses: Not on file  Food Insecurity:    Worried About Charity fundraiser in the Last Year: Not on file   [place] in the Last Year: Not on file  Transportation Needs:    Lack of Transportation (Medical): Not on file   Lack of Transportation (Non-Medical): Not on file  Physical Activity:    Days of Exercise per Week: Not on file   Minutes of Exercise per Session: Not on file  Stress:    Feeling of Stress : Not on file  Social Connections:    Frequency of Communication with Friends and Family: Not  on file   Frequency of Social Gatherings with Friends and Family: Not on file   Attends Religious Services: Not on file   Active Member of Clubs or Organizations: Not on file   Attends Archivist Meetings: Not on file   Marital Status: Not on file  Intimate Partner Violence:    Fear of Current or Ex-Partner: Not on file   Emotionally Abused: Not on file   Physically Abused: Not on file   Sexually Abused: Not on file    Past Surgical History:  Procedure Laterality Date   APPENDECTOMY  1950s   CARDIOVERSION N/A 07/08/2019   Procedure: CARDIOVERSION;  Surgeon: Pixie Casino, MD;  Location: [facility];  Service: Cardiovascular;  Laterality: N/A;   CARPAL TUNNEL RELEASE Bilateral [place]  2014   CHOLECYSTECTOMY  1980s   open   COLONOSCOPY N/A 02/17/2015   Procedure: COLONOSCOPY;  Surgeon: [name], MD;  Location: [facility];  Service: Endoscopy;  Laterality: N/A;   ESOPHAGOGASTRODUODENOSCOPY N/A 02/16/2015   Procedure: ESOPHAGOGASTRODUODENOSCOPY (EGD);  Surgeon: [name], MD;  Location: [facility];  Service: Endoscopy;  Laterality: N/A;   ESOPHAGOGASTRODUODENOSCOPY N/A 05/06/2015   Procedure: ESOPHAGOGASTRODUODENOSCOPY (EGD);  Surgeon: [name], MD;  Location[facility];  Service: Endoscopy;  Laterality: N/A;   EUS N/A 05/20/2015   Procedure: UPPER ENDOSCOPIC ULTRASOUND (EUS) LINEAR;  Surgeon: [name], MD;  Location: [facility] ENDOSCOPY;  Service: Endoscopy;  Laterality: N/A;   exploratory lab  1950s or [place] N/A 05/06/2015   Procedure: GIVENS CAPSULE STUDY;  Surgeon: [name], MD;  Location: [facility] ENDOSCOPY;  Service: Endoscopy;  Laterality: N/A;   KNEE ARTHROSCOPY WITH MEDIAL MENISECTOMY Left 04/18/2018   Procedure: LEFT KNEE ARTHROSCOPY WITH PARTIAL MEDIAL MENISECTOMY AND ANTERIOR CRUCIATE LIGAMENT DEBRIDEMENT;  Surgeon: [name], MD;  Location: [facility];  Service:  Orthopedics;  Laterality: Left;   LEFT HEART CATH AND CORONARY ANGIOGRAPHY N/A 11/08/2016   Procedure: Left Heart Cath and Coronary Angiography;  Surgeon: [name], MD;  Location: [facility];  Service: Cardiovascular;  Laterality: N/A;   LEFT HEART CATH AND CORONARY ANGIOGRAPHY N/A 08/19/2019   Procedure: LEFT HEART CATH AND CORONARY ANGIOGRAPHY;  Surgeon: Martinique, Peter M, MD;  Location: [facility];  Service: Cardiovascular;  Laterality: N/A;   lumbar back surgery  2012   [place]   TONSILLECTOMY AND ADENOIDECTOMY  1960s    Family History  Problem Relation Age of Onset   Stroke Mother    Heart disease Mother    Emphysema Father    Liver disease Sister    Ovarian cancer Sister    Stroke Sister    Other Child        died at birth   Colon cancer Neg Hx    Esophageal cancer Neg Hx    Stomach cancer Neg Hx    Pancreatic cancer Neg Hx     Allergies  Allergen Reactions   Iodinated Diagnostic Agents Anaphylaxis and Other (See Comments)    IPD dye Info given by patient   Iodine Anaphylaxis   Ioxaglate Anaphylaxis and Other (See Comments)   Lactose Intolerance (Gi) Anaphylaxis   Red Dye Anaphylaxis   Hydralazine Anxiety and Other (See Comments)    Facial flushing, pt prefers not to use it.    Milk-Related Compounds Other (See Comments)    Lactose intolerance Can tolerate milk if its cooked into the recipe, just can't drink milk    Propoxyphene Rash and Anxiety    Current Outpatient Medications on File Prior to Visit  Medication Sig Dispense Refill   acetaminophen (TYLENOL) 500 MG tablet Take 500 mg by mouth daily as needed for mild pain or moderate pain.      amLODipine (NORVASC) 5 MG tablet TAKE (1) TABLET BY MOUTH ONCE DAILY. 90 tablet 1   apixaban (ELIQUIS) 5 MG TABS tablet Take 1 tablet (5 mg total) by mouth 2 (two) times daily.     atorvastatin (LIPITOR) 40 MG tablet Take 1 tablet (40 mg total) by mouth daily  at 6 PM. 90 tablet 3   buPROPion (WELLBUTRIN XL) 150 MG 24 hr tablet TAKE (1) TABLET BY MOUTH ONCE DAILY. 90 tablet 1   furosemide (LASIX) 40 MG tablet TAKE 1 TABLET BY MOUTH ONCE DAILY. 90 tablet 1   meclizine (ANTIVERT) 25  MG tablet Take 1 tablet (25 mg total) by mouth as needed for dizziness or nausea. (Patient taking differently: Take 25 mg by mouth 3 (three) times daily as needed for dizziness or nausea. ) 90 tablet 1   Melatonin 5 MG CAPS Take 10 mg by mouth at bedtime.     pantoprazole (PROTONIX) 40 MG tablet TAKE 1 TABLET BY MOUTH TWICE DAILY. 180 tablet 1   Pediatric Multiple Vitamins (FLINTSTONES MULTIVITAMIN PO) Take 1 tablet by mouth every evening.     pregabalin (LYRICA) 50 MG capsule Take by mouth.     temazepam (RESTORIL) 15 MG capsule TAKE (1) CAPSULE BY MOUTH AT BEDTIME. 30 capsule 1   traMADol (ULTRAM) 50 MG tablet Take 1 tablet (50 mg total) by mouth every 8 (eight) hours as needed. 90 tablet 0   carvedilol (COREG) 25 MG tablet Take 1 tablet (25 mg total) by mouth 2 (two) times daily with a meal. 180 tablet 1   No current facility-administered medications on file prior to visit.    BP 130/70 (BP Location: Right Arm, Patient Position: Sitting, Cuff Size: Normal)    Pulse (!) 56    Temp (!) 97.5 F (36.4 C) (Oral)    Ht _0  (1.575 m)    Wt 101 lb 11.2 oz (46.1 kg)    BMI 18.60 kg/m       Objective:   Physical Exam Vitals and nursing note reviewed.  Constitutional:      Appearance: Normal appearance.  Cardiovascular:     Rate and Rhythm: Normal rate. Rhythm irregularly irregular.     Pulses: Normal pulses.     Heart sounds: Normal heart sounds.  Pulmonary:     Effort: Pulmonary effort is normal.     Breath sounds: Normal breath sounds.  Abdominal:     General: Abdomen is flat. Bowel sounds are normal.     Palpations: Abdomen is soft.  Musculoskeletal:        General: Tenderness (She does have some mild tenderness with palpation along at the base of the  left thumb.) present. Normal range of motion.     Comments: Unable to make fist in left hand. Significantly decreased grip strength due to loss of range of motion  Skin:    General: Skin is warm and dry.     Capillary Refill: Capillary refill takes less than 2 seconds.  Neurological:     General: No focal deficit present.     Mental Status: She is alert and oriented to person, place, and time.  Psychiatric:        Mood and Affect: Mood normal.        Behavior: Behavior normal.        Thought Content: Thought content normal.        Judgment: Judgment normal.     Comments: Speech is slowed but not much slurring status post CVA        Assessment & Plan:  1. Reactive depression - Continue with wellbutrin   2. Mixed hyperlipidemia - Consider increase in statin  - CBC with Differential/Platelet; Future - CMP with eGFR([facility]); Future - TSH; Future - Lipid panel; Future - Lipid panel - TSH - CMP with eGFR([facility]) - CBC with Differential/Platelet  3. Essential hypertension, benign - Controlled today. No change in medications. Will continue to monitor  - CBC with Differential/Platelet; Future - CMP with eGFR([facility]); Future - TSH; Future - Lipid panel; Future - Lipid panel - TSH - CMP with eGFR([facility]) -  CBC with Differential/Platelet  4. Need for immunization against influenza  - Flu Vaccine QUAD High Dose([facility])  5. Persistent atrial fibrillation ([name]) - Continue Eliquis.  - Follow up with Cardiology as directed  CBC with Differential/Platelet; Future - CMP with eGFR([facility]); Future - TSH; Future - Lipid panel; Future  6. Cerebrovascular accident (CVA) due to embolism of right middle cerebral artery ([place]) - Follow up with Cardiology as directed   CBC with Differential/Platelet; Future - CMP with eGFR([facility]); Future - TSH; Future - Lipid panel; Future   7. Coronary artery disease involving native coronary artery of native heart without angina pectoris - Continue  current plan of care by Cardiology   CBC with Differential/Platelet; Future - CMP with eGFR([facility]); Future - TSH; Future - Lipid panel; Future   8. Unstable angina ([name])  CBC with Differential/Platelet; Future - CMP with eGFR([facility]); Future - TSH; Future - Lipid panel; Future   9. Closed Colles' fracture of left radius with delayed healing, subsequent encounter - Will refer to home health as she does not drive and has limited transportation options. I believe this may be a good alternative to nerve block now that she is not having as much pain.  - I advised her that she could stop the Lyrica for the next few days to see how she felt  - Ambulatory referral to [place], NP

## 2020-04-10 LAB — TEST AUTHORIZATION

## 2020-04-10 LAB — CBC WITH DIFFERENTIAL/PLATELET
Absolute Monocytes: 413 cells/uL (ref 200–950)
Basophils Absolute: 19 cells/uL (ref 0–200)
Basophils Relative: 0.4 %
Eosinophils Absolute: 101 cells/uL (ref 15–500)
Eosinophils Relative: 2.1 %
HCT: 36.8 % (ref 35.0–45.0)
Hemoglobin: 12 g/dL (ref 11.7–15.5)
Lymphs Abs: 1373 cells/uL (ref 850–3900)
MCH: 28.8 pg (ref 27.0–33.0)
MCHC: 32.6 g/dL (ref 32.0–36.0)
MCV: 88.2 fL (ref 80.0–100.0)
MPV: 11.2 fL (ref 7.5–12.5)
Monocytes Relative: 8.6 %
Neutro Abs: 2894 cells/uL (ref 1500–7800)
Neutrophils Relative %: 60.3 %
Platelets: 158 10*3/uL (ref 140–400)
RBC: 4.17 10*6/uL (ref 3.80–5.10)
RDW: 12 % (ref 11.0–15.0)
Total Lymphocyte: 28.6 %
WBC: 4.8 10*3/uL (ref 3.8–10.8)

## 2020-04-10 LAB — HEMOGLOBIN A1C
Hgb A1c MFr Bld: 6 % of total Hgb — ABNORMAL HIGH (ref <5.7)
Mean Plasma Glucose: 126 (calc)
eAG (mmol/L): 7 (calc)

## 2020-04-10 LAB — COMPLETE METABOLIC PANEL WITH GFR
AG Ratio: 1.7 (calc) (ref 1.0–2.5)
ALT: 10 U/L (ref 6–29)
AST: 13 U/L (ref 10–35)
Albumin: 4 g/dL (ref 3.6–5.1)
Alkaline phosphatase (APISO): 98 U/L (ref 37–153)
BUN/Creatinine Ratio: 17 (calc) (ref 6–22)
BUN: 15 mg/dL (ref 7–25)
CO2: 32 mmol/L (ref 20–32)
Calcium: 9.2 mg/dL (ref 8.6–10.4)
Chloride: 103 mmol/L (ref 98–110)
Creat: 0.89 mg/dL — ABNORMAL HIGH (ref 0.60–0.88)
GFR, Est African American: 71 mL/min/{1.73_m2} (ref >=60)
GFR, Est Non African American: 61 mL/min/{1.73_m2} (ref >=60)
Globulin: 2.4 g/dL (calc) (ref 1.9–3.7)
Glucose, Bld: 152 mg/dL — ABNORMAL HIGH (ref 65–99)
Potassium: 3.6 mmol/L (ref 3.5–5.3)
Sodium: 145 mmol/L (ref 135–146)
Total Bilirubin: 0.7 mg/dL (ref 0.2–1.2)
Total Protein: 6.4 g/dL (ref 6.1–8.1)

## 2020-04-10 LAB — LIPID PANEL
Cholesterol: 166 mg/dL (ref <200)
HDL: 49 mg/dL — ABNORMAL LOW (ref >=50)
LDL Cholesterol (Calc): 99 mg/dL (calc)
Non-HDL Cholesterol (Calc): 117 mg/dL (calc) (ref <130)
Total CHOL/HDL Ratio: 3.4 (calc) (ref <5.0)
Triglycerides: 90 mg/dL (ref <150)

## 2020-04-10 LAB — TSH: TSH: 2.28 mIU/L (ref 0.40–4.50)

## 2020-04-13 ENCOUNTER — Telehealth: Payer: Self-pay | Admitting: Adult Health

## 2020-04-13 DIAGNOSIS — M6281 Muscle weakness (generalized): Secondary | ICD-10-CM | POA: Diagnosis not present

## 2020-04-13 DIAGNOSIS — I25111 Atherosclerotic heart disease of native coronary artery with angina pectoris with documented spasm: Secondary | ICD-10-CM | POA: Diagnosis not present

## 2020-04-13 DIAGNOSIS — I69322 Dysarthria following cerebral infarction: Secondary | ICD-10-CM | POA: Diagnosis not present

## 2020-04-13 DIAGNOSIS — I110 Hypertensive heart disease with heart failure: Secondary | ICD-10-CM | POA: Diagnosis not present

## 2020-04-13 DIAGNOSIS — R2681 Unsteadiness on feet: Secondary | ICD-10-CM | POA: Diagnosis not present

## 2020-04-13 DIAGNOSIS — Z743 Need for continuous supervision: Secondary | ICD-10-CM | POA: Diagnosis not present

## 2020-04-13 DIAGNOSIS — G9059 Complex regional pain syndrome I of other specified site: Secondary | ICD-10-CM | POA: Diagnosis not present

## 2020-04-13 DIAGNOSIS — S52532G Colles' fracture of left radius, subsequent encounter for closed fracture with delayed healing: Secondary | ICD-10-CM | POA: Diagnosis not present

## 2020-04-13 DIAGNOSIS — I5032 Chronic diastolic (congestive) heart failure: Secondary | ICD-10-CM | POA: Diagnosis not present

## 2020-04-13 DIAGNOSIS — F3289 Other specified depressive episodes: Secondary | ICD-10-CM | POA: Diagnosis not present

## 2020-04-13 NOTE — Telephone Encounter (Signed)
Ok for verbal orders ?

## 2020-04-13 NOTE — Telephone Encounter (Signed)
[name] is calling to get verbal orders for OT evaluation to address L hand function and PT 2 week 2 and 1 week 2.  [name] stated that you can leave a voicemail on her secured voicemail.

## 2020-04-14 NOTE — Telephone Encounter (Signed)
fyi

## 2020-04-14 NOTE — Telephone Encounter (Signed)
Spoke with [name] and informed her of the verbal orders as below.

## 2020-04-14 NOTE — Telephone Encounter (Signed)
[name] Physical Therapist from [facility] called to let [name] know that the patient is currently with another [facility] can they are not able to see this patient for PT.  FYI

## 2020-04-15 DIAGNOSIS — F3289 Other specified depressive episodes: Secondary | ICD-10-CM | POA: Diagnosis not present

## 2020-04-15 DIAGNOSIS — I110 Hypertensive heart disease with heart failure: Secondary | ICD-10-CM | POA: Diagnosis not present

## 2020-04-15 DIAGNOSIS — I5032 Chronic diastolic (congestive) heart failure: Secondary | ICD-10-CM | POA: Diagnosis not present

## 2020-04-15 DIAGNOSIS — I25111 Atherosclerotic heart disease of native coronary artery with angina pectoris with documented spasm: Secondary | ICD-10-CM | POA: Diagnosis not present

## 2020-04-15 DIAGNOSIS — S52532G Colles' fracture of left radius, subsequent encounter for closed fracture with delayed healing: Secondary | ICD-10-CM | POA: Diagnosis not present

## 2020-04-15 DIAGNOSIS — I69322 Dysarthria following cerebral infarction: Secondary | ICD-10-CM | POA: Diagnosis not present

## 2020-04-15 NOTE — Telephone Encounter (Signed)
Will [facility] [phone]  Needs verbal orders for 2 x a week for 3 weeks

## 2020-04-16 NOTE — Telephone Encounter (Signed)
Ok for verbal orders ?

## 2020-04-16 NOTE — Telephone Encounter (Signed)
Verbal left on [employer].

## 2020-04-21 DIAGNOSIS — I5032 Chronic diastolic (congestive) heart failure: Secondary | ICD-10-CM | POA: Diagnosis not present

## 2020-04-21 DIAGNOSIS — F3289 Other specified depressive episodes: Secondary | ICD-10-CM | POA: Diagnosis not present

## 2020-04-21 DIAGNOSIS — I69322 Dysarthria following cerebral infarction: Secondary | ICD-10-CM | POA: Diagnosis not present

## 2020-04-21 DIAGNOSIS — I25111 Atherosclerotic heart disease of native coronary artery with angina pectoris with documented spasm: Secondary | ICD-10-CM | POA: Diagnosis not present

## 2020-04-21 DIAGNOSIS — I110 Hypertensive heart disease with heart failure: Secondary | ICD-10-CM | POA: Diagnosis not present

## 2020-04-21 DIAGNOSIS — S52532G Colles' fracture of left radius, subsequent encounter for closed fracture with delayed healing: Secondary | ICD-10-CM | POA: Diagnosis not present

## 2020-04-23 DIAGNOSIS — I110 Hypertensive heart disease with heart failure: Secondary | ICD-10-CM | POA: Diagnosis not present

## 2020-04-23 DIAGNOSIS — I5032 Chronic diastolic (congestive) heart failure: Secondary | ICD-10-CM | POA: Diagnosis not present

## 2020-04-23 DIAGNOSIS — F3289 Other specified depressive episodes: Secondary | ICD-10-CM | POA: Diagnosis not present

## 2020-04-23 DIAGNOSIS — S52532G Colles' fracture of left radius, subsequent encounter for closed fracture with delayed healing: Secondary | ICD-10-CM | POA: Diagnosis not present

## 2020-04-23 DIAGNOSIS — I25111 Atherosclerotic heart disease of native coronary artery with angina pectoris with documented spasm: Secondary | ICD-10-CM | POA: Diagnosis not present

## 2020-04-23 DIAGNOSIS — I69322 Dysarthria following cerebral infarction: Secondary | ICD-10-CM | POA: Diagnosis not present

## 2020-04-24 ENCOUNTER — Other Ambulatory Visit: Payer: Self-pay | Admitting: Adult Health

## 2020-04-27 DIAGNOSIS — I25111 Atherosclerotic heart disease of native coronary artery with angina pectoris with documented spasm: Secondary | ICD-10-CM | POA: Diagnosis not present

## 2020-04-27 DIAGNOSIS — I5032 Chronic diastolic (congestive) heart failure: Secondary | ICD-10-CM | POA: Diagnosis not present

## 2020-04-27 DIAGNOSIS — S52532G Colles' fracture of left radius, subsequent encounter for closed fracture with delayed healing: Secondary | ICD-10-CM | POA: Diagnosis not present

## 2020-04-27 DIAGNOSIS — I69322 Dysarthria following cerebral infarction: Secondary | ICD-10-CM | POA: Diagnosis not present

## 2020-04-27 DIAGNOSIS — I110 Hypertensive heart disease with heart failure: Secondary | ICD-10-CM | POA: Diagnosis not present

## 2020-04-27 DIAGNOSIS — F3289 Other specified depressive episodes: Secondary | ICD-10-CM | POA: Diagnosis not present

## 2020-04-29 DIAGNOSIS — I69322 Dysarthria following cerebral infarction: Secondary | ICD-10-CM | POA: Diagnosis not present

## 2020-04-29 DIAGNOSIS — I25111 Atherosclerotic heart disease of native coronary artery with angina pectoris with documented spasm: Secondary | ICD-10-CM | POA: Diagnosis not present

## 2020-04-29 DIAGNOSIS — F3289 Other specified depressive episodes: Secondary | ICD-10-CM | POA: Diagnosis not present

## 2020-04-29 DIAGNOSIS — I5032 Chronic diastolic (congestive) heart failure: Secondary | ICD-10-CM | POA: Diagnosis not present

## 2020-04-29 DIAGNOSIS — S52532G Colles' fracture of left radius, subsequent encounter for closed fracture with delayed healing: Secondary | ICD-10-CM | POA: Diagnosis not present

## 2020-04-29 DIAGNOSIS — I110 Hypertensive heart disease with heart failure: Secondary | ICD-10-CM | POA: Diagnosis not present

## 2020-05-03 DIAGNOSIS — I110 Hypertensive heart disease with heart failure: Secondary | ICD-10-CM | POA: Diagnosis not present

## 2020-05-03 DIAGNOSIS — S52532G Colles' fracture of left radius, subsequent encounter for closed fracture with delayed healing: Secondary | ICD-10-CM | POA: Diagnosis not present

## 2020-05-03 DIAGNOSIS — I69322 Dysarthria following cerebral infarction: Secondary | ICD-10-CM | POA: Diagnosis not present

## 2020-05-03 DIAGNOSIS — I25111 Atherosclerotic heart disease of native coronary artery with angina pectoris with documented spasm: Secondary | ICD-10-CM | POA: Diagnosis not present

## 2020-05-03 DIAGNOSIS — I5032 Chronic diastolic (congestive) heart failure: Secondary | ICD-10-CM | POA: Diagnosis not present

## 2020-05-03 DIAGNOSIS — F3289 Other specified depressive episodes: Secondary | ICD-10-CM | POA: Diagnosis not present

## 2020-05-06 ENCOUNTER — Ambulatory Visit: Payer: Self-pay | Admitting: *Deleted

## 2020-05-13 DIAGNOSIS — R2681 Unsteadiness on feet: Secondary | ICD-10-CM | POA: Diagnosis not present

## 2020-05-13 DIAGNOSIS — F3289 Other specified depressive episodes: Secondary | ICD-10-CM | POA: Diagnosis not present

## 2020-05-13 DIAGNOSIS — Z743 Need for continuous supervision: Secondary | ICD-10-CM | POA: Diagnosis not present

## 2020-05-13 DIAGNOSIS — G9059 Complex regional pain syndrome I of other specified site: Secondary | ICD-10-CM | POA: Diagnosis not present

## 2020-05-13 DIAGNOSIS — S52532G Colles' fracture of left radius, subsequent encounter for closed fracture with delayed healing: Secondary | ICD-10-CM | POA: Diagnosis not present

## 2020-05-13 DIAGNOSIS — I5032 Chronic diastolic (congestive) heart failure: Secondary | ICD-10-CM | POA: Diagnosis not present

## 2020-05-13 DIAGNOSIS — I110 Hypertensive heart disease with heart failure: Secondary | ICD-10-CM | POA: Diagnosis not present

## 2020-05-13 DIAGNOSIS — I25111 Atherosclerotic heart disease of native coronary artery with angina pectoris with documented spasm: Secondary | ICD-10-CM | POA: Diagnosis not present

## 2020-05-13 DIAGNOSIS — M6281 Muscle weakness (generalized): Secondary | ICD-10-CM | POA: Diagnosis not present

## 2020-05-13 DIAGNOSIS — I69322 Dysarthria following cerebral infarction: Secondary | ICD-10-CM | POA: Diagnosis not present

## 2020-05-26 ENCOUNTER — Other Ambulatory Visit: Payer: Self-pay | Admitting: Adult Health

## 2020-05-26 DIAGNOSIS — E782 Mixed hyperlipidemia: Secondary | ICD-10-CM

## 2020-05-26 DIAGNOSIS — Z760 Encounter for issue of repeat prescription: Secondary | ICD-10-CM

## 2020-05-26 DIAGNOSIS — I10 Essential (primary) hypertension: Secondary | ICD-10-CM

## 2020-05-26 DIAGNOSIS — I63411 Cerebral infarction due to embolism of right middle cerebral artery: Secondary | ICD-10-CM

## 2020-05-26 DIAGNOSIS — I480 Paroxysmal atrial fibrillation: Secondary | ICD-10-CM

## 2020-06-23 ENCOUNTER — Other Ambulatory Visit: Payer: Self-pay | Admitting: Internal Medicine

## 2020-06-23 ENCOUNTER — Other Ambulatory Visit: Payer: Self-pay | Admitting: Adult Health

## 2020-07-19 ENCOUNTER — Other Ambulatory Visit: Payer: Self-pay | Admitting: Adult Health

## 2020-07-19 DIAGNOSIS — Z760 Encounter for issue of repeat prescription: Secondary | ICD-10-CM

## 2020-08-16 NOTE — Progress Notes (Unsigned)
Subjective:   [name] is a 80 y.o. female who presents for Medicare Annual (Subsequent) preventive examination.  Review of Systems    N/A        Objective:    There were no vitals filed for this visit. There is no height or weight on file to calculate BMI.  Advanced Directives 08/19/2019 07/08/2019 03/31/2019 03/15/2019 03/07/2019 03/06/2019 05/07/2018  Does Patient Have a Medical Advance Directive? No No No No - No Yes  Type of Advance Directive - - - - - - (No Data)  Does patient want to make changes to medical advance directive? - - - - - - No - Patient declined  Copy of [place] in Chart? - - - - - - -  Would patient like information on creating a medical advance directive? No - Patient declined Yes (MAU/Ambulatory/Procedural Areas - Information given) No - Patient declined No - Patient declined No - Patient declined - No - Patient declined    Current Medications (verified) Outpatient Encounter Medications as of 08/17/2020  Medication Sig  . acetaminophen (TYLENOL) 500 MG tablet Take 500 mg by mouth daily as needed for mild pain or moderate pain.   [name] amLODipine (NORVASC) 5 MG tablet TAKE (1) TABLET BY MOUTH ONCE DAILY.  [name] apixaban (ELIQUIS) 5 MG TABS tablet Take 1 tablet (5 mg total) by mouth 2 (two) times daily.  [name] atorvastatin (LIPITOR) 40 MG tablet TAKE 1 TABLET BY MOUTH ONCE DAILY AT 6:00 PM.  . buPROPion (WELLBUTRIN XL) 150 MG 24 hr tablet Take 1 tablet (150 mg total) by mouth daily.  . carvedilol (COREG) 25 MG tablet Take 1 tablet (25 mg total) by mouth 2 (two) times daily with a meal.  . furosemide (LASIX) 40 MG tablet TAKE 1 TABLET BY MOUTH ONCE DAILY.  . meclizine (ANTIVERT) 25 MG tablet Take 1 tablet (25 mg total) by mouth as needed for dizziness or nausea. (Patient taking differently: Take 25 mg by mouth 3 (three) times daily as needed for dizziness or nausea. )  . Melatonin 5 MG CAPS Take 10 mg by mouth at bedtime.  . pantoprazole (PROTONIX) 40 MG  tablet Take 1 tablet (40 mg total) by mouth 2 (two) times daily.  . Pediatric Multiple Vitamins (FLINTSTONES MULTIVITAMIN PO) Take 1 tablet by mouth every evening.  . pregabalin (LYRICA) 50 MG capsule Take by mouth.  . temazepam (RESTORIL) 15 MG capsule TAKE (1) CAPSULE BY MOUTH AT BEDTIME.  . traMADol (ULTRAM) 50 MG tablet Take 1 tablet (50 mg total) by mouth every 8 (eight) hours as needed.   No facility-administered encounter medications on file as of 08/17/2020.    Allergies (verified) Iodinated diagnostic agents, Iodine, Ioxaglate, Lactose intolerance (gi), Red dye, Hydralazine, Milk-related compounds, and Propoxyphene   History: Past Medical History:  Diagnosis Date  . Anemia 2005   Generally microcytic, transfusions in [address], 2012, 02/2015, 05/2015  . Aortic stenosis    Moderate November 2017  . Arthritis   . Broken back 2013   Chronic back pain.   [name] CAD (coronary artery disease)    Cardiac catheterization 2014 - 80% mid RCA and 70% OM managed medically  . CHF (congestive heart failure) ([place])   . Chronic bilateral pleural effusions   . Chronic headache   . Contrast media allergy   . Diastolic heart failure ([place])   . Diverticulosis   . Esophageal cancer ([place]) 05/06/2015   Adenocarcinoma GE junction  . Facial paresthesia   .  GERD (gastroesophageal reflux disease)   . H. pylori infection   . H/O iron deficiency    05-06-15 iron infusion ([facility])  . HH (hiatus hernia) 2008   Large with associated erosions  . Hypertension   . Paroxysmal atrial fibrillation ([name])   . PONV (postoperative nausea and vomiting)   . Pulmonary emboli ([name]) 2008, 2012  . Stroke ([place]) 1982  . Swallowing dysfunction    Past Surgical History:  Procedure Laterality Date  . APPENDECTOMY  1950s  . CARDIOVERSION N/A 07/08/2019   Procedure: CARDIOVERSION;  Surgeon: Pixie Casino, MD;  Location: [facility];  Service: Cardiovascular;  Laterality: N/A;  . CARPAL TUNNEL RELEASE Bilateral 1990s  .  [place] SURGERY  2014  . CHOLECYSTECTOMY  1980s   open  . COLONOSCOPY N/A 02/17/2015   Procedure: COLONOSCOPY;  Surgeon: [name], MD;  Location: [facility];  Service: Endoscopy;  Laterality: N/A;  . ESOPHAGOGASTRODUODENOSCOPY N/A 02/16/2015   Procedure: ESOPHAGOGASTRODUODENOSCOPY (EGD);  Surgeon: [name], MD;  Location: [facility];  Service: Endoscopy;  Laterality: N/A;  . ESOPHAGOGASTRODUODENOSCOPY N/A 05/06/2015   Procedure: ESOPHAGOGASTRODUODENOSCOPY (EGD);  Surgeon: [name], MD;  Location: [facility] ENDOSCOPY;  Service: Endoscopy;  Laterality: N/A;  . EUS N/A 05/20/2015   Procedure: UPPER ENDOSCOPIC ULTRASOUND (EUS) LINEAR;  Surgeon: [name], MD;  Location: [facility];  Service: Endoscopy;  Laterality: N/A;  . exploratory lab  1950s or 1960s  . GIVENS CAPSULE STUDY N/A 05/06/2015   Procedure: GIVENS CAPSULE STUDY;  Surgeon: [name], MD;  Location: [facility] ENDOSCOPY;  Service: Endoscopy;  Laterality: N/A;  . KNEE ARTHROSCOPY WITH MEDIAL MENISECTOMY Left 04/18/2018   Procedure: LEFT KNEE ARTHROSCOPY WITH PARTIAL MEDIAL MENISECTOMY AND ANTERIOR CRUCIATE LIGAMENT DEBRIDEMENT;  Surgeon: [name], MD;  Location: [facility];  Service: Orthopedics;  Laterality: Left;  . LEFT HEART CATH AND CORONARY ANGIOGRAPHY N/A 11/08/2016   Procedure: Left Heart Cath and Coronary Angiography;  Surgeon: [name], MD;  Location: [facility];  Service: Cardiovascular;  Laterality: N/A;  . LEFT HEART CATH AND CORONARY ANGIOGRAPHY N/A 08/19/2019   Procedure: LEFT HEART CATH AND CORONARY ANGIOGRAPHY;  Surgeon: Martinique, Peter M, MD;  Location: [facility];  Service: Cardiovascular;  Laterality: N/A;  . lumbar back surgery  2012  . [place] SURGERY  1991  . TONSILLECTOMY AND ADENOIDECTOMY  1960s   Family History  Problem Relation Age of Onset  . Stroke Mother   . Heart disease Mother   . Emphysema Father   . Liver disease Sister   . Ovarian cancer Sister    . Stroke Sister   . Other Child        died at birth  . Colon cancer Neg Hx   . Esophageal cancer Neg Hx   . Stomach cancer Neg Hx   . Pancreatic cancer Neg Hx    Social History   Socioeconomic History  . Marital status: Widowed    Spouse name: Not on file  . Number of children: 3  . Years of education: 76  . Highest education level: Not on file  Occupational History  . Occupation: Retired  Tobacco Use  . Smoking status: Never Smoker  . Smokeless tobacco: Never Used  Vaping Use  . Vaping Use: Never used  Substance and Sexual Activity  . Alcohol use: No    Alcohol/week: 0.0 standard drinks  . Drug use: No  . Sexual activity: Not Currently  Other Topics  Concern  . Not on file  Social History Narrative   Born and raised in [place], [place]. Currently resides in a house with her son. 1 dog. Fun: go to church   Divorced(Has total of #3 children)-[place], [place], [place]   Denies religious beliefs that would effect health care.    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[employer] Strain: Not on file  Food Insecurity: Not on file  Transportation Needs: Not on file  Physical Activity: Not on file  Stress: Not on file  Social Connections: Not on file    Tobacco Counseling Counseling given: Not Answered   Clinical Intake:                 Diabetic? No          Activities of Daily Living No flowsheet data found.  Patient Care Team: Dorothyann Peng, NP as PCP - General (Family Medicine) Debara Pickett Nadean Corwin, MD as PCP - Cardiology (Cardiology) Kyung Rudd, MD as Consulting Physician (Radiation Oncology) Grace Isaac, MD as Consulting Physician (Cardiothoracic Surgery) Ladell Pier, MD as Consulting Physician (Oncology) Stark Klein, MD as Consulting Physician (General Surgery) Frann Rider, NP as Nurse Practitioner  (Neurology) Verner Mould, MD as Consulting Physician (Orthopedic Surgery)  Indicate any recent Medical Services you may have received from other than [facility] providers in the past year (date may be approximate).     Assessment:   This is a routine wellness examination for Sheronica.  Hearing/Vision screen No exam data present  Dietary issues and exercise activities discussed:    Goals    . Worship God and do God's praises      Depression Screen PHQ 2/9 Scores 05/26/2019 03/31/2019 03/18/2019 09/05/2017 02/22/2017  PHQ - 2 Score 0 1 4 0 0  PHQ- 9 Score - - 8 - 0    Fall Risk Fall Risk  03/31/2019 03/18/2019 09/05/2017 09/05/2017 09/05/2017  Falls in the past year? 0 0 Yes Yes Yes  Comment - - - - -  Number falls in past yr: 0 0 1 - 1  Comment - - - - -  Injury with Fall? 0 0 Yes Yes Yes  Risk Factor Category  - - High Fall Risk - -  Risk for fall due to : - - History of fall(s);Impaired balance/gait;Impaired vision;Medication side effect History of fall(s);Medication side effect -  Follow up Falls prevention discussed - Falls evaluation completed;Education provided;Falls prevention discussed Falls evaluation completed;Education provided;Falls prevention discussed -    FALL RISK PREVENTION PERTAINING TO THE HOME:  Any stairs in or around the home? {YES/NO:21197} If so, are there any without handrails? No  Home free of loose throw rugs in walkways, pet beds, electrical cords, etc? Yes  Adequate lighting in your home to reduce risk of falls? Yes   ASSISTIVE DEVICES UTILIZED TO PREVENT FALLS:  Life alert? {YES/NO:21197} Use of a cane, walker or w/c? {YES/NO:21197} Grab bars in the bathroom? {YES/NO:21197} Shower chair or bench in shower? {YES/NO:21197} Elevated toilet seat or a handicapped toilet? {YES/NO:21197}    Cognitive Function: MMSE - Mini Mental State Exam 02/22/2017  Orientation to time 5  Orientation to Place 5  Registration 3  Attention/ Calculation 5  Recall 2   Language- name 2 objects 2  Language- repeat 1  Language- follow 3 step command 3  Language- read & follow direction 1  Write a sentence 1  Copy design 1  Total score 29        Immunizations Immunization History  Administered Date(s) Administered  . Fluad Quad(high Dose 65+) 04/08/2020  . Moderna Sars-Covid-2 Vaccination 10/01/2019, 10/29/2019  . Pneumococcal Conjugate-13 05/02/2017    TDAP status: Due, Education has been provided regarding the importance of this vaccine. Advised may receive this vaccine at local pharmacy or Health Dept. Aware to provide a copy of the vaccination record if obtained from local pharmacy or Health Dept. Verbalized acceptance and understanding.  Flu Vaccine status: Up to date  Pneumococcal vaccine status: Due, Education has been provided regarding the importance of this vaccine. Advised may receive this vaccine at local pharmacy or Health Dept. Aware to provide a copy of the vaccination record if obtained from local pharmacy or Health Dept. Verbalized acceptance and understanding.  Covid-19 vaccine status: Completed vaccines  Qualifies for Shingles Vaccine? Yes   Zostavax completed No   Shingrix Completed?: No.    Education has been provided regarding the importance of this vaccine. Patient has been advised to call insurance company to determine out of pocket expense if they have not yet received this vaccine. Advised may also receive vaccine at local pharmacy or Health Dept. Verbalized acceptance and understanding.  Screening Tests Health Maintenance  Topic Date Due  . URINE MICROALBUMIN  Never done  . TETANUS/TDAP  Never done  . DEXA SCAN  Never done  . PNA vac Low Risk Adult (2 of 2 - PPSV23) 05/02/2018  . COVID-19 Vaccine (3 - Moderna risk 4-dose series) 11/26/2019  . INFLUENZA VACCINE  Completed    Health Maintenance  Health Maintenance Due  Topic Date Due  . URINE MICROALBUMIN  Never done  . TETANUS/TDAP  Never done  . DEXA SCAN   Never done  . PNA vac Low Risk Adult (2 of 2 - PPSV23) 05/02/2018  . COVID-19 Vaccine (3 - Moderna risk 4-dose series) 11/26/2019    Colorectal cancer screening: No longer required.   Mammogram status: No longer required due to age.  Bone Density status: Ordered 08/17/2020. Pt provided with contact info and advised to call to schedule appt.  Lung Cancer Screening: (Low Dose CT Chest recommended if Age 9-80 years, 30 pack-year currently smoking OR have quit w/in 15years.) does not qualify.   Lung Cancer Screening Referral: N/A   Additional Screening:  Hepatitis C Screening: does not qualify;   Vision Screening: Recommended annual ophthalmology exams for early detection of glaucoma and other disorders of the eye. Is the patient up to date with their annual eye exam?  {YES/NO:21197} Who is the provider or what is the name of the office in which the patient attends annual eye exams? *** If pt is not established with a provider, would they like to be referred to a provider to establish care? {YES/NO:21197}.   Dental Screening: Recommended annual dental exams for proper oral hygiene  Community Resource Referral / Chronic Care Management: CRR required this visit?  {YES/NO:21197}  CCM required this visit?  {YES/NO:21197}     Plan:     I have personally reviewed and noted the following in the patient's chart:   . Medical and social history . Use of alcohol, tobacco or illicit drugs  . Current medications and supplements . Functional ability and status . Nutritional status . Physical activity . Advanced directives . List of other physicians . Hospitalizations, surgeries, and ER visits in previous 12 months . Vitals . Screenings to include  cognitive, depression, and falls . Referrals and appointments  In addition, I have reviewed and discussed with patient certain preventive protocols, quality metrics, and best practice recommendations. A written personalized care plan for  preventive services as well as general preventive health recommendations were provided to patient.     Ofilia Neas, LPN   [id number]   Nurse Notes: ***

## 2020-08-17 ENCOUNTER — Ambulatory Visit: Payer: 59

## 2020-08-17 ENCOUNTER — Other Ambulatory Visit: Payer: Self-pay

## 2020-08-17 DIAGNOSIS — Z0000 Encounter for general adult medical examination without abnormal findings: Secondary | ICD-10-CM

## 2020-08-17 NOTE — Progress Notes (Signed)
Erroneous Encounter

## 2020-08-18 NOTE — Progress Notes (Signed)
Subjective:   [name] is a 80 y.o. female who presents for Medicare Annual (Subsequent) preventive examination.  Virtual Visit via Video Note  I connected with [name]  by a video enabled telemedicine application and verified that I am speaking with the correct person using two identifiers.  Location: Patient: Home Provider: Office Persons participating in the virtual visit: patient, provider   I discussed the limitations of evaluation and management by telemedicine and the availability of in person appointments. The patient expressed understanding and agreed to proceed.     [name] Jermaine Neuharth,LPN    Review of Systems    N/A  Cardiac Risk Factors include: advanced age (>17men, >20 women);dyslipidemia;hypertension     Objective:    Today's Vitals   08/19/20 1447  PainSc: 6    There is no height or weight on file to calculate BMI.  Advanced Directives 08/19/2020 08/19/2019 07/08/2019 03/31/2019 03/15/2019 03/07/2019 03/06/2019  Does Patient Have a Medical Advance Directive? No No No No No - No  Type of Advance Directive - - - - - - -  Does patient want to make changes to medical advance directive? - - - - - - -  Copy of [place] in Chart? - - - - - - -  Would patient like information on creating a medical advance directive? No - Patient declined No - Patient declined Yes (MAU/Ambulatory/Procedural Areas - Information given) No - Patient declined No - Patient declined No - Patient declined -    Current Medications (verified) Outpatient Encounter Medications as of 08/19/2020  Medication Sig  . acetaminophen (TYLENOL) 500 MG tablet Take 500 mg by mouth daily as needed for mild pain or moderate pain.   [name] amLODipine (NORVASC) 5 MG tablet TAKE (1) TABLET BY MOUTH ONCE DAILY.  [name] apixaban (ELIQUIS) 5 MG TABS tablet Take 1 tablet (5 mg total) by mouth 2 (two) times daily.  [name] atorvastatin (LIPITOR) 40 MG tablet TAKE 1 TABLET BY MOUTH ONCE DAILY AT 6:00 PM.   . buPROPion (WELLBUTRIN XL) 150 MG 24 hr tablet Take 1 tablet (150 mg total) by mouth daily.  . carvedilol (COREG) 25 MG tablet Take 1 tablet (25 mg total) by mouth 2 (two) times daily with a meal.  . cholecalciferol (VITAMIN D3) 25 MCG (1000 UNIT) tablet Take 1,000 Units by mouth daily.  . furosemide (LASIX) 40 MG tablet TAKE 1 TABLET BY MOUTH ONCE DAILY.  . meclizine (ANTIVERT) 25 MG tablet Take 1 tablet (25 mg total) by mouth as needed for dizziness or nausea. (Patient taking differently: Take 25 mg by mouth 3 (three) times daily as needed for dizziness or nausea.)  . Melatonin 5 MG CAPS Take 10 mg by mouth at bedtime.  . pantoprazole (PROTONIX) 40 MG tablet Take 1 tablet (40 mg total) by mouth 2 (two) times daily.  . Pediatric Multiple Vitamins (FLINTSTONES MULTIVITAMIN PO) Take 1 tablet by mouth every evening.  . temazepam (RESTORIL) 15 MG capsule TAKE (1) CAPSULE BY MOUTH AT BEDTIME.  . traMADol (ULTRAM) 50 MG tablet Take 1 tablet (50 mg total) by mouth every 8 (eight) hours as needed.  . vitamin C (ASCORBIC ACID) 500 MG tablet Take 500 mg by mouth daily.  . pregabalin (LYRICA) 50 MG capsule Take by mouth. (Patient not taking: Reported on 08/19/2020)   No facility-administered encounter medications on file as of 08/19/2020.    Allergies (verified) Iodinated diagnostic agents, Iodine, Ioxaglate, Lactose intolerance (gi), Red dye, Hydralazine, Milk-related compounds,  and Propoxyphene   History: Past Medical History:  Diagnosis Date  . Anemia 2005   Generally microcytic, transfusions in [address], 2012, 02/2015, 05/2015  . Aortic stenosis    Moderate November 2017  . Arthritis   . Broken back 2013   Chronic back pain.   [name] CAD (coronary artery disease)    Cardiac catheterization 2014 - 80% mid RCA and 70% OM managed medically  . CHF (congestive heart failure) ([place])   . Chronic bilateral pleural effusions   . Chronic headache   . Contrast media allergy   . Diastolic heart failure ([place])    . Diverticulosis   . Esophageal cancer ([place]) 05/06/2015   Adenocarcinoma GE junction  . Facial paresthesia   . GERD (gastroesophageal reflux disease)   . H. pylori infection   . H/O iron deficiency    05-06-15 iron infusion ([facility])  . HH (hiatus hernia) 2008   Large with associated erosions  . Hypertension   . Paroxysmal atrial fibrillation ([name])   . PONV (postoperative nausea and vomiting)   . Pulmonary emboli ([place]) 2008, 2012  . Stroke ([place]) 1982  . Swallowing dysfunction    Past Surgical History:  Procedure Laterality Date  . APPENDECTOMY  1950s  . CARDIOVERSION N/A 07/08/2019   Procedure: CARDIOVERSION;  Surgeon: Pixie Casino, MD;  Location: [facility];  Service: Cardiovascular;  Laterality: N/A;  . CARPAL TUNNEL RELEASE Bilateral 1990s  . [place] SURGERY  2014  . CHOLECYSTECTOMY  1980s   open  . COLONOSCOPY N/A 02/17/2015   Procedure: COLONOSCOPY;  Surgeon: [name], MD;  Location: [facility];  Service: Endoscopy;  Laterality: N/A;  . ESOPHAGOGASTRODUODENOSCOPY N/A 02/16/2015   Procedure: ESOPHAGOGASTRODUODENOSCOPY (EGD);  Surgeon: [name], MD;  Location: [facility];  Service: Endoscopy;  Laterality: N/A;  . ESOPHAGOGASTRODUODENOSCOPY N/A 05/06/2015   Procedure: ESOPHAGOGASTRODUODENOSCOPY (EGD);  Surgeon: [name], MD;  Location: [facility] ENDOSCOPY;  Service: Endoscopy;  Laterality: N/A;  . EUS N/A 05/20/2015   Procedure: UPPER ENDOSCOPIC ULTRASOUND (EUS) LINEAR;  Surgeon: [name], MD;  Location: [facility]OS[facility];  Service: Endoscopy;  Laterality: N/A;  . exploratory lab  1950s or 1960s  . GIVENS CAPSULE STUDY N/A 05/06/2015   Procedure: GIVENS CAPSULE STUDY;  Surgeon: [name], MD;  Location: [facility] ENDOSCOPY;  Service: Endoscopy;  Laterality: N/A;  . KNEE ARTHROSCOPY WITH MEDIAL MENISECTOMY Left 04/18/2018   Procedure: LEFT KNEE ARTHROSCOPY WITH PARTIAL MEDIAL MENISECTOMY AND ANTERIOR CRUCIATE LIGAMENT DEBRIDEMENT;  Surgeon: [name], MD;  Location: [facility];  Service: Orthopedics;  Laterality: Left;  . LEFT HEART CATH AND CORONARY ANGIOGRAPHY N/A 11/08/2016   Procedure: Left Heart Cath and Coronary Angiography;  Surgeon: [name], MD;  Location: [facility] CV LAB;  Service: Cardiovascular;  Laterality: N/A;  . LEFT HEART CATH AND CORONARY ANGIOGRAPHY N/A 08/19/2019   Procedure: LEFT HEART CATH AND CORONARY ANGIOGRAPHY;  Surgeon: Martinique, Peter M, MD;  Location: [facility];  Service: Cardiovascular;  Laterality: N/A;  . lumbar back surgery  2012  . [place] SURGERY  1991  . TONSILLECTOMY AND ADENOIDECTOMY  1960s   Family History  Problem Relation Age of Onset  . Stroke Mother   . Heart disease Mother   . Emphysema Father   . Liver disease Sister   . Ovarian cancer Sister   . Stroke Sister   . Other Child        died at birth  . Colon cancer Neg  Hx   . Esophageal cancer Neg Hx   . Stomach cancer Neg Hx   . Pancreatic cancer Neg Hx    Social History   Socioeconomic History  . Marital status: Widowed    Spouse name: Not on file  . Number of children: 3  . Years of education: 70  . Highest education level: Not on file  Occupational History  . Occupation: Retired  Tobacco Use  . Smoking status: Never Smoker  . Smokeless tobacco: Never Used  Vaping Use  . Vaping Use: Never used  Substance and Sexual Activity  . Alcohol use: No    Alcohol/week: 0.0 standard drinks  . Drug use: No  . Sexual activity: Not Currently  Other Topics Concern  . Not on file  Social History Narrative   Born and raised in [place], [place]. Currently resides in a house with her son. 1 dog. Fun: go to church   Divorced(Has total of #3 children)-[place], [place], [place]   Denies religious beliefs that would effect health care.    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[employer] Strain: [place]    . Difficulty of Paying Living Expenses: Not hard at all  Food Insecurity: No Food Insecurity  . Worried About Charity fundraiser in the Last Year: Never true  . Ran Out of Food in the Last Year: Never true  Transportation Needs: No Transportation Needs  . Lack of Transportation (Medical): No  . Lack of Transportation (Non-Medical): No  Physical Activity: Inactive  . Days of Exercise per Week: 0 days  . Minutes of Exercise per Session: 0 min  Stress: No Stress Concern Present  . Feeling of Stress : Not at all  Social Connections: Moderately Integrated  . Frequency of Communication with Friends and Family: More than three times a week  . Frequency of Social Gatherings with Friends and Family: More than three times a week  . Attends Religious Services: More than 4 times per year  . Active Member of Clubs or Organizations: Yes  . Attends Archivist Meetings: More than 4 times per year  . Marital Status: Widowed    Tobacco Counseling Counseling given: Not Answered   Clinical Intake:  Pre-visit preparation completed: Yes  Pain : 0-10 Pain Score: 6  Pain Type: Chronic pain Pain Location: Back Pain Descriptors / Indicators: Numbness,Pins and needles Pain Onset: More than a month ago Pain Frequency: Constant Pain Relieving Factors: nothing  Pain Relieving Factors: nothing  Diabetes: No  How often do you need to have someone help you when you read instructions, pamphlets, or other written materials from your doctor or pharmacy?: 1 - Never  Diabetic? No   Interpreter Needed?: No  Information entered by :: [place] of Daily Living In your present state of health, do you have any difficulty performing the following activities: 08/19/2020  Hearing? Y  Vision? Y  Comment has been having blurred vision even with glasses  Difficulty concentrating or making decisions? N  Walking or climbing stairs? N  Dressing or bathing? N  Doing errands, shopping?  Y  Comment Patient states she no longer drives  Preparing Food and eating ? N  Using the Toilet? N  In the past six months, have you accidently leaked urine? N  Do you have problems with loss of bowel control? N  Managing your Medications? N  Managing your Finances? N  Housekeeping or managing your Housekeeping? N  Some recent data might be hidden    Patient Care Team: Dorothyann Peng, NP as PCP - General (Family Medicine) Debara Pickett Nadean Corwin, MD as PCP - Cardiology (Cardiology) Kyung Rudd, MD as Consulting Physician (Radiation Oncology) Grace Isaac, MD as Consulting Physician (Cardiothoracic Surgery) Ladell Pier, MD as Consulting Physician (Oncology) Stark Klein, MD as Consulting Physician (General Surgery) Frann Rider, NP as Nurse Practitioner (Neurology) Verner Mould, MD as Consulting Physician (Orthopedic Surgery)  Indicate any recent Medical Services you may have received from other than [facility] providers in the past year (date may be approximate).     Assessment:   This is a routine wellness examination for [place].  Hearing/Vision screen  Hearing Screening   125Hz  250Hz  500Hz  1000Hz  2000Hz  3000Hz  4000Hz  6000Hz  8000Hz   Right ear:           Left ear:           Vision Screening Comments: Has not had eye exam since cataract surgery. Currently having issues and is schedule to see Dr .Herbert Deaner on 09/03/2020   Dietary issues and exercise activities discussed: Current Exercise Habits: The patient does not participate in regular exercise at present, Exercise limited by: orthopedic condition(s)  Goals    . Patient Stated     I will continue to walk in my house     . Worship God and do God's praises      Depression Screen PHQ 2/9 Scores 08/19/2020 05/26/2019 03/31/2019 03/18/2019 09/05/2017 02/22/2017  PHQ - 2 Score 0 0 1 4 0 0  PHQ- 9 Score - - - 8 - 0    Fall Risk Fall Risk  08/19/2020 03/31/2019 03/18/2019 09/05/2017 09/05/2017  Falls in the past year? 1 0 0  Yes Yes  Comment - - - - -  Number falls in past yr: 0 0 0 1 -  Comment - - - - -  Injury with Fall? 1 0 0 Yes Yes  Risk Factor Category  - - - High Fall Risk -  Risk for fall due to : History of fall(s);Impaired vision;Impaired balance/gait - - History of fall(s);Impaired balance/gait;Impaired vision;Medication side effect History of fall(s);Medication side effect  Follow up Falls evaluation completed;Falls prevention discussed Falls prevention discussed - Falls evaluation completed;Education provided;Falls prevention discussed Falls evaluation completed;Education provided;Falls prevention discussed    FALL RISK PREVENTION PERTAINING TO THE HOME:  Any stairs in or around the home? Yes  If so, are there any without handrails? No  Home free of loose throw rugs in walkways, pet beds, electrical cords, etc? Yes  Adequate lighting in your home to reduce risk of falls? Yes   ASSISTIVE DEVICES UTILIZED TO PREVENT FALLS:  Life alert? No  Use of a cane, walker or w/c? Yes  Grab bars in the bathroom? No  Shower chair or bench in shower? Yes  Elevated toilet seat or a handicapped toilet? No    Cognitive Function: Normal cognitive status assessed by direct observation by this Nurse Health Advisor. No abnormalities found.   MMSE - Mini Mental State Exam 02/22/2017  Orientation to time 5  Orientation to Place 5  Registration 3  Attention/ Calculation 5  Recall 2  Language- name 2 objects 2  Language- repeat 1  Language- follow 3 step command 3  Language- read & follow direction 1  Write a sentence 1  Copy design 1  Total score 29  Immunizations Immunization History  Administered Date(s) Administered  . Fluad Quad(high Dose 65+) 04/08/2020  . Moderna Sars-Covid-2 Vaccination 10/01/2019, 10/29/2019  . Pneumococcal Conjugate-13 05/02/2017    TDAP status: Due, Education has been provided regarding the importance of this vaccine. Advised may receive this vaccine at local  pharmacy or Health Dept. Aware to provide a copy of the vaccination record if obtained from local pharmacy or Health Dept. Verbalized acceptance and understanding.  Flu Vaccine status: Up to date  Pneumococcal vaccine status: Due, Education has been provided regarding the importance of this vaccine. Advised may receive this vaccine at local pharmacy or Health Dept. Aware to provide a copy of the vaccination record if obtained from local pharmacy or Health Dept. Verbalized acceptance and understanding.  Covid-19 vaccine status: Completed vaccines  Qualifies for Shingles Vaccine? Yes   Zostavax completed No   Shingrix Completed?: No.    Education has been provided regarding the importance of this vaccine. Patient has been advised to call insurance company to determine out of pocket expense if they have not yet received this vaccine. Advised may also receive vaccine at local pharmacy or Health Dept. Verbalized acceptance and understanding.  Screening Tests Health Maintenance  Topic Date Due  . URINE MICROALBUMIN  Never done  . TETANUS/TDAP  Never done  . DEXA SCAN  Never done  . PNA vac Low Risk Adult (2 of 2 - PPSV23) 05/02/2018  . COVID-19 Vaccine (3 - Moderna risk 4-dose series) 11/26/2019  . INFLUENZA VACCINE  Completed    Health Maintenance  Health Maintenance Due  Topic Date Due  . URINE MICROALBUMIN  Never done  . TETANUS/TDAP  Never done  . DEXA SCAN  Never done  . PNA vac Low Risk Adult (2 of 2 - PPSV23) 05/02/2018  . COVID-19 Vaccine (3 - Moderna risk 4-dose series) 11/26/2019    Colorectal cancer screening: Type of screening: Colonoscopy. Completed 02/17/2015. Repeat every 10 years  Mammogram status: Ordered 08/19/2020. Pt provided with contact info and advised to call to schedule appt.   Bone Density status: Ordered 08/19/2020. Pt provided with contact info and advised to call to schedule appt.  Lung Cancer Screening: (Low Dose CT Chest recommended if Age 72-80 years,  30 pack-year currently smoking OR have quit w/in 15years.) does not qualify.   Lung Cancer Screening Referral: N/A   Additional Screening:  Hepatitis C Screening: does not qualify;   Vision Screening: Recommended annual ophthalmology exams for early detection of glaucoma and other disorders of the eye. Is the patient up to date with their annual eye exam?  No  Who is the provider or what is the name of the office in which the patient attends annual eye exams? Dr. Herbert Deaner  If pt is not established with a provider, would they like to be referred to a provider to establish care? No .   Dental Screening: Recommended annual dental exams for proper oral hygiene  Community Resource Referral / Chronic Care Management: CRR required this visit?  No   CCM required this visit?  No      Plan:     I have personally reviewed and noted the following in the patient's chart:   . Medical and social history . Use of alcohol, tobacco or illicit drugs  . Current medications and supplements . Functional ability and status . Nutritional status . Physical activity . Advanced directives . List of other physicians . Hospitalizations, surgeries, and ER visits in previous 12 months . Vitals .  Screenings to include cognitive, depression, and falls . Referrals and appointments  In addition, I have reviewed and discussed with patient certain preventive protocols, quality metrics, and best practice recommendations. A written personalized care plan for preventive services as well as general preventive health recommendations were provided to patient.     Ofilia Neas, LPN   [id number]   Nurse Notes: None

## 2020-08-19 ENCOUNTER — Ambulatory Visit (INDEPENDENT_AMBULATORY_CARE_PROVIDER_SITE_OTHER): Payer: 59

## 2020-08-19 DIAGNOSIS — Z0000 Encounter for general adult medical examination without abnormal findings: Secondary | ICD-10-CM | POA: Diagnosis not present

## 2020-08-19 NOTE — Patient Instructions (Signed)
Ms. [name] , Thank you for taking time to come for your Medicare Wellness Visit. I appreciate your ongoing commitment to your health goals. Please review the following plan we discussed and let me know if I can assist you in the future.   Screening recommendations/referrals: Colonoscopy: No longer required  Mammogram: Currently due, orders placed this visit. Bone Density: Currently due, orders placed this visit  Recommended yearly ophthalmology/optometry visit for glaucoma screening and checkup Recommended yearly dental visit for hygiene and checkup  Vaccinations: Influenza vaccine: Up to date, next due fall 2022  Pneumococcal vaccine: Currently due for Pneumovax 23 you may receive at your next office visit  Tdap vaccine: Currently due, you may await and injury to receive so that medicare will cover. Shingles vaccine: Currently due for Shingrix, if you wish to receive we recommend that you do so at your local pharmacy as it is less expensive.    Advanced directives: Advance directive discussed with you today. Even though you declined this today please call our office should you change your mind and we can give you the proper paperwork for you to fill out.   Conditions/risks identified: None   Next appointment: 08/25/2020 @ 1:30 PM with Dorothyann Peng, NP   Preventive Care 81 Years and Older, Female Preventive care refers to lifestyle choices and visits with your health care provider that can promote health and wellness. What does preventive care include?  A yearly physical exam. This is also called an annual well check.  Dental exams once or twice a year.  Routine eye exams. Ask your health care provider how often you should have your eyes checked.  Personal lifestyle choices, including:  Daily care of your teeth and gums.  Regular physical activity.  Eating a healthy diet.  Avoiding tobacco and drug use.  Limiting alcohol use.  Practicing safe sex.  Taking low-dose  aspirin every day.  Taking vitamin and mineral supplements as recommended by your health care provider. What happens during an annual well check? The services and screenings done by your health care provider during your annual well check will depend on your age, overall health, lifestyle risk factors, and family history of disease. Counseling  Your health care provider may ask you questions about your:  Alcohol use.  Tobacco use.  Drug use.  Emotional well-being.  Home and relationship well-being.  Sexual activity.  Eating habits.  History of falls.  Memory and ability to understand (cognition).  Work and work Statistician.  Reproductive health. Screening  You may have the following tests or measurements:  Height, weight, and BMI.  Blood pressure.  Lipid and cholesterol levels. These may be checked every 5 years, or more frequently if you are over 81 years old.  Skin check.  Lung cancer screening. You may have this screening every year starting at age 811 if you have a 30-pack-year history of smoking and currently smoke or have quit within the past 15 years.  Fecal occult blood test (FOBT) of the stool. You may have this test every year starting at age 81.  Flexible sigmoidoscopy or colonoscopy. You may have a sigmoidoscopy every 5 years or a colonoscopy every 10 years starting at age 811.  Hepatitis C blood test.  Hepatitis B blood test.  Sexually transmitted disease (STD) testing.  Diabetes screening. This is done by checking your blood sugar (glucose) after you have not eaten for a while (fasting). You may have this done every 1-3 years.  Bone density scan. This is  done to screen for osteoporosis. You may have this done starting at age 811.  Mammogram. This may be done every 1-2 years. Talk to your health care provider about how often you should have regular mammograms. Talk with your health care provider about your test results, treatment options, and if  necessary, the need for more tests. Vaccines  Your health care provider may recommend certain vaccines, such as:  Influenza vaccine. This is recommended every year.  Tetanus, diphtheria, and acellular pertussis (Tdap, Td) vaccine. You may need a Td booster every 10 years.  Zoster vaccine. You may need this after age 81.  Pneumococcal 13-valent conjugate (PCV13) vaccine. One dose is recommended after age 81.  Pneumococcal polysaccharide (PPSV23) vaccine. One dose is recommended after age 81. Talk to your health care provider about which screenings and vaccines you need and how often you need them. This information is not intended to replace advice given to you by your health care provider. Make sure you discuss any questions you have with your health care provider. Document Released: 07/16/2015 Document Revised: 03/08/2016 Document Reviewed: 04/20/2015 Elsevier Interactive Patient Education  2017 [place] Prevention in the Home Falls can cause injuries. They can happen to people of all ages. There are many things you can do to make your home safe and to help prevent falls. What can I do on the outside of my home?  Regularly fix the edges of walkways and driveways and fix any cracks.  Remove anything that might make you trip as you walk through a door, such as a raised step or threshold.  Trim any bushes or trees on the path to your home.  Use bright outdoor lighting.  Clear any walking paths of anything that might make someone trip, such as rocks or tools.  Regularly check to see if handrails are loose or broken. Make sure that both sides of any steps have handrails.  Any raised decks and porches should have guardrails on the edges.  Have any leaves, snow, or ice cleared regularly.  Use sand or salt on walking paths during winter.  Clean up any spills in your garage right away. This includes oil or grease spills. What can I do in the bathroom?  Use night  lights.  Install grab bars by the toilet and in the tub and shower. Do not use towel bars as grab bars.  Use non-skid mats or decals in the tub or shower.  If you need to sit down in the shower, use a plastic, non-slip stool.  Keep the floor dry. Clean up any water that spills on the floor as soon as it happens.  Remove soap buildup in the tub or shower regularly.  Attach bath mats securely with double-sided non-slip rug tape.  Do not have throw rugs and other things on the floor that can make you trip. What can I do in the bedroom?  Use night lights.  Make sure that you have a light by your bed that is easy to reach.  Do not use any sheets or blankets that are too big for your bed. They should not hang down onto the floor.  Have a firm chair that has side arms. You can use this for support while you get dressed.  Do not have throw rugs and other things on the floor that can make you trip. What can I do in the kitchen?  Clean up any spills right away.  Avoid walking on wet floors.  Keep  items that you use a lot in easy-to-reach places.  If you need to reach something above you, use a strong step stool that has a grab bar.  Keep electrical cords out of the way.  Do not use floor polish or wax that makes floors slippery. If you must use wax, use non-skid floor wax.  Do not have throw rugs and other things on the floor that can make you trip. What can I do with my stairs?  Do not leave any items on the stairs.  Make sure that there are handrails on both sides of the stairs and use them. Fix handrails that are broken or loose. Make sure that handrails are as long as the stairways.  Check any carpeting to make sure that it is firmly attached to the stairs. Fix any carpet that is loose or worn.  Avoid having throw rugs at the top or bottom of the stairs. If you do have throw rugs, attach them to the floor with carpet tape.  Make sure that you have a light switch at the  top of the stairs and the bottom of the stairs. If you do not have them, ask someone to add them for you. What else can I do to help prevent falls?  Wear shoes that:  Do not have high heels.  Have rubber bottoms.  Are comfortable and fit you well.  Are closed at the toe. Do not wear sandals.  If you use a stepladder:  Make sure that it is fully opened. Do not climb a closed stepladder.  Make sure that both sides of the stepladder are locked into place.  Ask someone to hold it for you, if possible.  Clearly mark and make sure that you can see:  Any grab bars or handrails.  First and last steps.  Where the edge of each step is.  Use tools that help you move around (mobility aids) if they are needed. These include:  Canes.  Walkers.  Scooters.  Crutches.  Turn on the lights when you go into a dark area. Replace any light bulbs as soon as they burn out.  Set up your furniture so you have a clear path. Avoid moving your furniture around.  If any of your floors are uneven, fix them.  If there are any pets around you, be aware of where they are.  Review your medicines with your doctor. Some medicines can make you feel dizzy. This can increase your chance of falling. Ask your doctor what other things that you can do to help prevent falls. This information is not intended to replace advice given to you by your health care provider. Make sure you discuss any questions you have with your health care provider. Document Released: 04/15/2009 Document Revised: 11/25/2015 Document Reviewed: 07/24/2014 Elsevier Interactive Patient Education  2017 [employer].

## 2020-08-24 ENCOUNTER — Other Ambulatory Visit: Payer: Self-pay

## 2020-08-25 ENCOUNTER — Encounter: Payer: Self-pay | Admitting: Adult Health

## 2020-08-25 ENCOUNTER — Ambulatory Visit (INDEPENDENT_AMBULATORY_CARE_PROVIDER_SITE_OTHER): Payer: Medicare Other | Admitting: Adult Health

## 2020-08-25 VITALS — BP 150/80 | Temp 98.3°F | Wt 100.0 lb

## 2020-08-25 DIAGNOSIS — M5442 Lumbago with sciatica, left side: Secondary | ICD-10-CM | POA: Diagnosis not present

## 2020-08-25 MED ORDER — METHYLPREDNISOLONE 4 MG PO TBPK: ORAL_TABLET | ORAL | 0 refills | Status: DC

## 2020-08-25 NOTE — Progress Notes (Signed)
Subjective:    Patient ID: [name], female    DOB: [birth year], 80 y.o.   MRN: [id number]  HPI  80 year old female who  has a past medical history of Anemia (2005), Aortic stenosis, Arthritis, Broken back (2013), CAD (coronary artery disease), CHF (congestive heart failure) ([facility]), Chronic bilateral pleural effusions, Chronic headache, Contrast media allergy, Diastolic heart failure ([place]), Diverticulosis, Esophageal cancer ([place]) (05/06/2015), Facial paresthesia, GERD (gastroesophageal reflux disease), H. pylori infection, H/O iron deficiency, HH (hiatus hernia) (2008), Hypertension, Paroxysmal atrial fibrillation ([place]), PONV (postoperative nausea and vomiting), Pulmonary emboli ([place]) (2008, 2012), Stroke [facility]) (1982), and Swallowing dysfunction.  She presents to the office today for an acute issue.  Her symptoms started 2 to 3 days ago.  Symptoms consist of low back pain that travels down the outside of her left leg.  Pain is described as "a deep shooting pain" and is pretty constant throughout the day but  worse with change of positions as well as walking.  The last 2 days she has been woken up from sleep in the morning due to the shooting pain.  Does report that prescribed tramadol helps to ease the pain.  Denies injury or falls  Review of Systems See HPI   Past Medical History:  Diagnosis Date  . Anemia 2005   Generally microcytic, transfusions in [address], 2012, 02/2015, 05/2015  . Aortic stenosis    Moderate November 2017  . Arthritis   . Broken back 2013   Chronic back pain.   [name] CAD (coronary artery disease)    Cardiac catheterization 2014 - 80% mid RCA and 70% OM managed medically  . CHF (congestive heart failure) ([place])   . Chronic bilateral pleural effusions   . Chronic headache   . Contrast media allergy   . Diastolic heart failure ([place])   . Diverticulosis   . Esophageal cancer ([place]) 05/06/2015   Adenocarcinoma GE junction  . Facial paresthesia   . GERD  (gastroesophageal reflux disease)   . H. pylori infection   . H/O iron deficiency    05-06-15 iron infusion ([facility])  . HH (hiatus hernia) 2008   Large with associated erosions  . Hypertension   . Paroxysmal atrial fibrillation ([name])   . PONV (postoperative nausea and vomiting)   . Pulmonary emboli ([place]) 2008, 2012  . Stroke ([place]) 1982  . Swallowing dysfunction     Social History   Socioeconomic History  . Marital status: Widowed    Spouse name: Not on file  . Number of children: 3  . Years of education: 95  . Highest education level: Not on file  Occupational History  . Occupation: Retired  Tobacco Use  . Smoking status: Never Smoker  . Smokeless tobacco: Never Used  Vaping Use  . Vaping Use: Never used  Substance and Sexual Activity  . Alcohol use: No    Alcohol/week: 0.0 standard drinks  . Drug use: No  . Sexual activity: Not Currently  Other Topics Concern  . Not on file  Social History Narrative   Born and raised in [place], [place]. Currently resides in a house with her son. 1 dog. Fun: go to church   Divorced(Has total of #3 children)-, [place], [place] Jun[place]   Denies religious beliefs that would effect health care.    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[employer] Strain: [place]   .  Difficulty of Paying Living Expenses: Not hard at all  Food Insecurity: No Food Insecurity  . Worried About Charity fundraiser in the Last Year: Never true  . Ran Out of Food in the Last Year: Never true  Transportation Needs: No Transportation Needs  . Lack of Transportation (Medical): No  . Lack of Transportation (Non-Medical): No  Physical Activity: Inactive  . Days of Exercise per Week: 0 days  . Minutes of Exercise per Session: 0 min  Stress: No Stress Concern Present  . Feeling of Stress : Not at all  Social Connections: Moderately Integrated  .  Frequency of Communication with Friends and Family: More than three times a week  . Frequency of Social Gatherings with Friends and Family: More than three times a week  . Attends Religious Services: More than 4 times per year  . Active Member of Clubs or Organizations: Yes  . Attends Archivist Meetings: More than 4 times per year  . Marital Status: Widowed  Intimate Partner Violence: Not At Risk  . Fear of Current or Ex-Partner: No  . Emotionally Abused: No  . Physically Abused: No  . Sexually Abused: No    Past Surgical History:  Procedure Laterality Date  . APPENDECTOMY  1950s  . CARDIOVERSION N/A 07/08/2019   Procedure: CARDIOVERSION;  Surgeon: Pixie Casino, MD;  Location: [facility];  Service: Cardiovascular;  Laterality: N/A;  . CARPAL TUNNEL RELEASE Bilateral 1990s  . [facility] SURGERY  2014  . CHOLECYSTECTOMY  1980s   open  . COLONOSCOPY N/A 02/17/2015   Procedure: COLONOSCOPY;  Surgeon: [name], MD;  Location: [facility];  Service: Endoscopy;  Laterality: N/A;  . ESOPHAGOGASTRODUODENOSCOPY N/A 02/16/2015   Procedure: ESOPHAGOGASTRODUODENOSCOPY (EGD);  Surgeon: [name], MD;  Location: [facility];  Service: Endoscopy;  Laterality: N/A;  . ESOPHAGOGASTRODUODENOSCOPY N/A 05/06/2015   Procedure: ESOPHAGOGASTRODUODENOSCOPY (EGD);  Surgeon: [name], MD;  Location: [facility] END[facility]COPY;  Service: Endoscopy;  Laterality: N/A;  . EUS N/A 05/20/2015   Procedure: UPPER ENDOSCOPIC ULTRASOUND (EUS) LINEAR;  Surgeon: [name], MD;  Location: [facility];  Service: Endoscopy;  Laterality: N/A;  . exploratory lab  1950s or 1960s  . GIVENS CAPSULE STUDY N/A 05/06/2015   Procedure: GIVENS CAPSULE STUDY;  Surgeon: [name], MD;  Location: [facility] ENDOSCOPY;  Service: Endoscopy;  Laterality: N/A;  . KNEE ARTHROSCOPY WITH MEDIAL MENISECTOMY Left 04/18/2018   Procedure: LEFT KNEE ARTHROSCOPY WITH PARTIAL MEDIAL MENISECTOMY AND ANTERIOR CRUCIATE LIGAMENT  DEBRIDEMENT;  Surgeon: [name], MD;  Location: [facility];  Service: Orthopedics;  Laterality: Left;  . LEFT HEART CATH AND CORONARY ANGIOGRAPHY N/A 11/08/2016   Procedure: Left Heart Cath and Coronary Angiography;  Surgeon: [name], MD;  Location: [facility];  Service: Cardiovascular;  Laterality: N/A;  . LEFT HEART CATH AND CORONARY ANGIOGRAPHY N/A 08/19/2019   Procedure: LEFT HEART CATH AND CORONARY ANGIOGRAPHY;  Surgeon: Martinique, Peter M, MD;  Location: [facility];  Service: Cardiovascular;  Laterality: N/A;  . lumbar back surgery  2012  . [place] SURGERY  1991  . TONSILLECTOMY AND ADENOIDECTOMY  1960s    Family History  Problem Relation Age of Onset  . Stroke Mother   . Heart disease Mother   . Emphysema Father   . Liver disease Sister   . Ovarian cancer Sister   . Stroke Sister   . Other Child        died  at birth  . Colon cancer Neg Hx   . Esophageal cancer Neg Hx   . Stomach cancer Neg Hx   . Pancreatic cancer Neg Hx     Allergies  Allergen Reactions  . Iodinated Diagnostic Agents Anaphylaxis and Other (See Comments)    IPD dye Info given by patient  . Iodine Anaphylaxis  . Ioxaglate Anaphylaxis and Other (See Comments)  . Lactose Intolerance (Gi) Anaphylaxis  . Red Dye Anaphylaxis  . Hydralazine Anxiety and Other (See Comments)    Facial flushing, pt prefers not to use it.   . Milk-Related Compounds Other (See Comments)    Lactose intolerance Can tolerate milk if its cooked into the recipe, just can't drink milk   . Propoxyphene Rash and Anxiety    Current Outpatient Medications on File Prior to Visit  Medication Sig Dispense Refill  . acetaminophen (TYLENOL) 500 MG tablet Take 500 mg by mouth daily as needed for mild pain or moderate pain.     [name] amLODipine (NORVASC) 5 MG tablet TAKE (1) TABLET BY MOUTH ONCE DAILY. 90 tablet 3  . apixaban (ELIQUIS) 5 MG TABS tablet Take 1 tablet (5 mg total) by mouth 2 (two) times daily. 180  tablet 3  . atorvastatin (LIPITOR) 40 MG tablet TAKE 1 TABLET BY MOUTH ONCE DAILY AT 6:00 PM. 90 tablet 3  . cholecalciferol (VITAMIN D3) 25 MCG (1000 UNIT) tablet Take 1,000 Units by mouth daily.    . furosemide (LASIX) 40 MG tablet TAKE 1 TABLET BY MOUTH ONCE DAILY. 90 tablet 1  . meclizine (ANTIVERT) 25 MG tablet Take 1 tablet (25 mg total) by mouth as needed for dizziness or nausea. (Patient taking differently: Take 25 mg by mouth 3 (three) times daily as needed for dizziness or nausea.) 90 tablet 1  . Melatonin 5 MG CAPS Take 10 mg by mouth at bedtime.    . pantoprazole (PROTONIX) 40 MG tablet Take 1 tablet (40 mg total) by mouth 2 (two) times daily. 180 tablet 1  . Pediatric Multiple Vitamins (FLINTSTONES MULTIVITAMIN PO) Take 1 tablet by mouth every evening.    . temazepam (RESTORIL) 15 MG capsule TAKE (1) CAPSULE BY MOUTH AT BEDTIME. 30 capsule 2  . traMADol (ULTRAM) 50 MG tablet Take 1 tablet (50 mg total) by mouth every 8 (eight) hours as needed. 90 tablet 0  . vitamin C (ASCORBIC ACID) 500 MG tablet Take 500 mg by mouth daily.    [name] buPROPion (WELLBUTRIN XL) 150 MG 24 hr tablet Take 1 tablet (150 mg total) by mouth daily. 90 tablet 1  . carvedilol (COREG) 25 MG tablet Take 1 tablet (25 mg total) by mouth 2 (two) times daily with a meal. 180 tablet 3   No current facility-administered medications on file prior to visit.    BP (!) 150/80   Temp 98.3 F (36.8 C)   Wt 100 lb (45.4 kg)   BMI 18.29 kg/m       Objective:   Physical Exam Vitals and nursing note reviewed.  Constitutional:      Appearance: Normal appearance.  Musculoskeletal:        General: Tenderness (Has tenderness with palpation along left sided sciatic nerve from buttocks to the knee) present.  Skin:    General: Skin is warm and dry.     Capillary Refill: Capillary refill takes 2 to 3 seconds.  Neurological:     General: No focal deficit present.     Mental Status: She is alert  and oriented to person,  place, and time.  Psychiatric:        Mood and Affect: Mood normal.        Thought Content: Thought content normal.        Judgment: Judgment normal.       Assessment & Plan:  1. Acute left-sided low back pain with left-sided sciatica - advised gentle stretching exercises and warm compress to lower back. Will prescribe medrol dose pack - methylPREDNISolone (MEDROL DOSEPAK) 4 MG TBPK tablet; Take as directed  Dispense: 21 tablet; Refill: 0 - Follow up if no relief in the next 2-4 days   Dorothyann Peng, NP

## 2020-08-25 NOTE — Patient Instructions (Addendum)
It was great seeing you today!   I think you have sciatica. I have sent in a steroid pack to see if we can get you feeling better.   You can also use a heating pad to the low back and also do some gentle stretching exercies  Let me know if this does not help   I hope you have a happy birthday!   Sciatica  Sciatica is pain, weakness, tingling, or loss of feeling (numbness) along the sciatic nerve. The sciatic nerve starts in the lower back and goes down the back of each leg. Sciatica usually goes away on its own or with treatment. Sometimes, sciatica may come back (recur). What are the causes? This condition happens when the sciatic nerve is pinched or has pressure put on it. This may be the result of:  A disk in between the bones of the spine bulging out too far (herniated disk).  Changes in the spinal disks that occur with aging.  A condition that affects a muscle in the butt.  Extra bone growth near the sciatic nerve.  A break (fracture) of the area between your hip bones (pelvis).  Pregnancy.  Tumor. This is rare. What increases the risk? You are more likely to develop this condition if you:  Play sports that put pressure or stress on the spine.  Have poor strength and ease of movement (flexibility).  Have had a back injury in the past.  Have had back surgery.  Sit for long periods of time.  Do activities that involve bending or lifting over and over again.  Are very overweight (obese). What are the signs or symptoms? Symptoms can vary from mild to very bad. They may include:  Any of these problems in the lower back, leg, hip, or butt: ? Mild tingling, loss of feeling, or dull aches. ? Burning sensations. ? Sharp pains.  Loss of feeling in the back of the calf or the sole of the foot.  Leg weakness.  Very bad back pain that makes it hard to move. These symptoms may get worse when you cough, sneeze, or laugh. They may also get worse when you sit or stand  for long periods of time. How is this treated? This condition often gets better without any treatment. However, treatment may include:  Changing or cutting back on physical activity when you have pain.  Doing exercises and stretching.  Putting ice or heat on the affected area.  Medicines that help: ? To relieve pain and swelling. ? To relax your muscles.  Shots (injections) of medicines that help to relieve pain, irritation, and swelling.  Surgery. Follow these instructions at home: Medicines  Take over-the-counter and prescription medicines only as told by your doctor.  Ask your doctor if the medicine prescribed to you: ? Requires you to avoid driving or using heavy machinery. ? Can cause trouble pooping (constipation). You may need to take these steps to prevent or treat trouble pooping:  Drink enough fluids to keep your pee (urine) pale yellow.  Take over-the-counter or prescription medicines.  Eat foods that are high in fiber. These include beans, whole grains, and fresh fruits and vegetables.  Limit foods that are high in fat and sugar. These include fried or sweet foods. Managing pain  If told, put ice on the affected area. ? Put ice in a plastic bag. ? Place a towel between your skin and the bag. ? Leave the ice on for 20 minutes, 2-3 times a day.  If told, put heat on the affected area. Use the heat source that your doctor tells you to use, such as a moist heat pack or a heating pad. ? Place a towel between your skin and the heat source. ? Leave the heat on for 20-30 minutes. ? Remove the heat if your skin turns bright red. This is very important if you are unable to feel pain, heat, or cold. You may have a greater risk of getting burned.      Activity  Return to your normal activities as told by your doctor. Ask your doctor what activities are safe for you.  Avoid activities that make your symptoms worse.  Take short rests during the day. ? When you rest  for a long time, do some physical activity or stretching between periods of rest. ? Avoid sitting for a long time without moving. Get up and move around at least one time each hour.  Exercise and stretch regularly, as told by your doctor.  Do not lift anything that is heavier than 10 lb (4.5 kg) while you have symptoms of sciatica. ? Avoid lifting heavy things even when you do not have symptoms. ? Avoid lifting heavy things over and over.  When you lift objects, always lift in a way that is safe for your body. To do this, you should: ? Bend your knees. ? Keep the object close to your body. ? Avoid twisting.   General instructions  Stay at a healthy weight.  Wear comfortable shoes that support your feet. Avoid wearing high heels.  Avoid sleeping on a mattress that is too soft or too hard. You might have less pain if you sleep on a mattress that is firm enough to support your back.  Keep all follow-up visits as told by your doctor. This is important. Contact a doctor if:  You have pain that: ? Wakes you up when you are sleeping. ? Gets worse when you lie down. ? Is worse than the pain you have had in the past. ? Lasts longer than 4 weeks.  You lose weight without trying. Get help right away if:  You cannot control when you pee (urinate) or poop (have a bowel movement).  You have weakness in any of these areas and it gets worse: ? Lower back. ? The area between your hip bones. ? Butt. ? Legs.  You have redness or swelling of your back.  You have a burning feeling when you pee. Summary  Sciatica is pain, weakness, tingling, or loss of feeling (numbness) along the sciatic nerve.  This condition happens when the sciatic nerve is pinched or has pressure put on it.  Sciatica can cause pain, tingling, or loss of feeling (numbness) in the lower back, legs, hips, and butt.  Treatment often includes rest, exercise, medicines, and putting ice or heat on the affected area. This  information is not intended to replace advice given to you by your health care provider. Make sure you discuss any questions you have with your health care provider. Document Revised: 07/08/2018 Document Reviewed: 07/08/2018 [employer] Patient Education  [employer].

## 2020-09-27 ENCOUNTER — Telehealth: Payer: Self-pay | Admitting: Internal Medicine

## 2020-09-27 NOTE — Telephone Encounter (Signed)
3.28.22 LVM to schedule 6 mo fu w/Dr. Debara Pickett. LP

## 2020-10-12 ENCOUNTER — Other Ambulatory Visit: Payer: Self-pay | Admitting: Adult Health

## 2020-10-19 NOTE — Telephone Encounter (Signed)
Last refill- 01/30/2020-90 tabs no refills Last office visit- 08/25/20  No future appointment scheduled

## 2020-11-16 ENCOUNTER — Other Ambulatory Visit: Payer: Self-pay | Admitting: Adult Health

## 2020-11-16 DIAGNOSIS — Z760 Encounter for issue of repeat prescription: Secondary | ICD-10-CM

## 2020-11-23 NOTE — Telephone Encounter (Signed)
Okay for refill?    LOV 08/2020

## 2020-11-28 ENCOUNTER — Encounter (HOSPITAL_COMMUNITY): Payer: Self-pay | Admitting: *Deleted

## 2020-11-28 ENCOUNTER — Emergency Department (HOSPITAL_COMMUNITY): Payer: Medicare Other

## 2020-11-28 ENCOUNTER — Observation Stay (HOSPITAL_COMMUNITY)
Admission: EM | Admit: 2020-11-28 | Discharge: 2020-11-29 | Disposition: A | Discharge status: Home / Self Care | Payer: Medicare Other | Attending: Family Medicine | Admitting: Family Medicine

## 2020-11-28 ENCOUNTER — Other Ambulatory Visit: Payer: Self-pay

## 2020-11-28 DIAGNOSIS — Z79899 Other long term (current) drug therapy: Secondary | ICD-10-CM | POA: Insufficient documentation

## 2020-11-28 DIAGNOSIS — I480 Paroxysmal atrial fibrillation: Secondary | ICD-10-CM

## 2020-11-28 DIAGNOSIS — Z86711 Personal history of pulmonary embolism: Secondary | ICD-10-CM | POA: Diagnosis present

## 2020-11-28 DIAGNOSIS — Z8501 Personal history of malignant neoplasm of esophagus: Secondary | ICD-10-CM | POA: Insufficient documentation

## 2020-11-28 DIAGNOSIS — Z20822 Contact with and (suspected) exposure to covid-19: Secondary | ICD-10-CM | POA: Diagnosis not present

## 2020-11-28 DIAGNOSIS — I10 Essential (primary) hypertension: Secondary | ICD-10-CM | POA: Diagnosis present

## 2020-11-28 DIAGNOSIS — I5030 Unspecified diastolic (congestive) heart failure: Secondary | ICD-10-CM | POA: Diagnosis present

## 2020-11-28 DIAGNOSIS — I63411 Cerebral infarction due to embolism of right middle cerebral artery: Secondary | ICD-10-CM

## 2020-11-28 DIAGNOSIS — R42 Dizziness and giddiness: Secondary | ICD-10-CM

## 2020-11-28 DIAGNOSIS — I5032 Chronic diastolic (congestive) heart failure: Secondary | ICD-10-CM | POA: Diagnosis not present

## 2020-11-28 DIAGNOSIS — E782 Mixed hyperlipidemia: Secondary | ICD-10-CM

## 2020-11-28 DIAGNOSIS — I2510 Atherosclerotic heart disease of native coronary artery without angina pectoris: Secondary | ICD-10-CM | POA: Insufficient documentation

## 2020-11-28 DIAGNOSIS — Z8673 Personal history of transient ischemic attack (TIA), and cerebral infarction without residual deficits: Secondary | ICD-10-CM | POA: Insufficient documentation

## 2020-11-28 DIAGNOSIS — Z7901 Long term (current) use of anticoagulants: Secondary | ICD-10-CM

## 2020-11-28 DIAGNOSIS — I110 Hypertensive heart disease with heart failure: Secondary | ICD-10-CM | POA: Insufficient documentation

## 2020-11-28 DIAGNOSIS — R2990 Unspecified symptoms and signs involving the nervous system: Secondary | ICD-10-CM

## 2020-11-28 DIAGNOSIS — I4819 Other persistent atrial fibrillation: Secondary | ICD-10-CM | POA: Diagnosis present

## 2020-11-28 DIAGNOSIS — M542 Cervicalgia: Secondary | ICD-10-CM | POA: Diagnosis present

## 2020-11-28 DIAGNOSIS — H8110 Benign paroxysmal vertigo, unspecified ear: Secondary | ICD-10-CM | POA: Diagnosis not present

## 2020-11-28 LAB — CBC WITH DIFFERENTIAL/PLATELET
Abs Immature Granulocytes: 0.02 10*3/uL (ref 0.00–0.07)
Basophils Absolute: 0 10*3/uL (ref 0.0–0.1)
Basophils Relative: 0 %
Eosinophils Absolute: 0.1 10*3/uL (ref 0.0–0.5)
Eosinophils Relative: 2 %
HCT: 34.8 % — ABNORMAL LOW (ref 36.0–46.0)
Hemoglobin: 11.7 g/dL — ABNORMAL LOW (ref 12.0–15.0)
Immature Granulocytes: 0 %
Lymphocytes Relative: 20 %
Lymphs Abs: 1.1 10*3/uL (ref 0.7–4.0)
MCH: 29.1 pg (ref 26.0–34.0)
MCHC: 33.6 g/dL (ref 30.0–36.0)
MCV: 86.6 fL (ref 80.0–100.0)
Monocytes Absolute: 0.5 10*3/uL (ref 0.1–1.0)
Monocytes Relative: 10 %
Neutro Abs: 3.6 10*3/uL (ref 1.7–7.7)
Neutrophils Relative %: 68 %
Platelets: 123 10*3/uL — ABNORMAL LOW (ref 150–400)
RBC: 4.02 MIL/uL (ref 3.87–5.11)
RDW: 12.5 % (ref 11.5–15.5)
WBC: 5.3 10*3/uL (ref 4.0–10.5)
nRBC: 0 % (ref 0.0–0.2)

## 2020-11-28 LAB — COMPREHENSIVE METABOLIC PANEL
ALT: 16 U/L (ref 0–44)
AST: 16 U/L (ref 15–41)
Albumin: 3.8 g/dL (ref 3.5–5.0)
Alkaline Phosphatase: 104 U/L (ref 38–126)
Anion gap: 8 (ref 5–15)
BUN: 16 mg/dL (ref 8–23)
CO2: 30 mmol/L (ref 22–32)
Calcium: 8.4 mg/dL — ABNORMAL LOW (ref 8.9–10.3)
Chloride: 96 mmol/L — ABNORMAL LOW (ref 98–111)
Creatinine, Ser: 0.91 mg/dL (ref 0.44–1.00)
GFR, Estimated: >60 mL/min (ref >60)
Glucose, Bld: 167 mg/dL — ABNORMAL HIGH (ref 70–99)
Potassium: 3.2 mmol/L — ABNORMAL LOW (ref 3.5–5.1)
Sodium: 134 mmol/L — ABNORMAL LOW (ref 135–145)
Total Bilirubin: 1.1 mg/dL (ref 0.3–1.2)
Total Protein: 6.9 g/dL (ref 6.5–8.1)

## 2020-11-28 LAB — TROPONIN I (HIGH SENSITIVITY)
Troponin I (High Sensitivity): 4 ng/L (ref <18)
Troponin I (High Sensitivity): 4 ng/L (ref <18)

## 2020-11-28 LAB — RESP PANEL BY RT-PCR (FLU A&B, COVID) ARPGX2
Influenza A by PCR: NEGATIVE
Influenza B by PCR: NEGATIVE
SARS Coronavirus 2 by RT PCR: NEGATIVE

## 2020-11-28 LAB — MAGNESIUM: Magnesium: 2 mg/dL (ref 1.7–2.4)

## 2020-11-28 MED ORDER — ATORVASTATIN CALCIUM 40 MG PO TABS
40.0000 mg | ORAL_TABLET | Freq: Every day | ORAL | Status: DC
  Administered 2020-11-29: 40 mg via ORAL
  Filled 2020-11-28: qty 1

## 2020-11-28 MED ORDER — LORAZEPAM 2 MG/ML IJ SOLN: 0.5000 mg | Freq: Once | INTRAMUSCULAR | Status: DC

## 2020-11-28 MED ORDER — SENNOSIDES-DOCUSATE SODIUM 8.6-50 MG PO TABS: 1.0000 | ORAL_TABLET | Freq: Every evening | ORAL | Status: DC | PRN

## 2020-11-28 MED ORDER — BUPROPION HCL ER (XL) 150 MG PO TB24
150.0000 mg | ORAL_TABLET | Freq: Every day | ORAL | Status: DC
  Administered 2020-11-29: 150 mg via ORAL
  Filled 2020-11-28: qty 1

## 2020-11-28 MED ORDER — APIXABAN 5 MG PO TABS
5.0000 mg | ORAL_TABLET | Freq: Two times a day (BID) | ORAL | Status: DC
  Administered 2020-11-29: 5 mg via ORAL
  Filled 2020-11-28 (×2): qty 1

## 2020-11-28 MED ORDER — ACETAMINOPHEN 325 MG PO TABS
650.0000 mg | ORAL_TABLET | ORAL | Status: DC | PRN
  Administered 2020-11-29: 650 mg via ORAL
  Filled 2020-11-28: qty 2

## 2020-11-28 MED ORDER — LABETALOL HCL 5 MG/ML IV SOLN: 10.0000 mg | INTRAVENOUS | Status: DC | PRN

## 2020-11-28 MED ORDER — FENTANYL CITRATE (PF) 100 MCG/2ML IJ SOLN
25.0000 ug | Freq: Once | INTRAMUSCULAR | Status: AC
  Administered 2020-11-28: 25 ug via INTRAVENOUS
  Filled 2020-11-28: qty 2

## 2020-11-28 MED ORDER — IOHEXOL 350 MG/ML SOLN
75.0000 mL | Freq: Once | INTRAVENOUS | Status: AC | PRN
  Administered 2020-11-28: 75 mL via INTRAVENOUS

## 2020-11-28 MED ORDER — HYDROCORTISONE NA SUCCINATE PF 250 MG IJ SOLR
200.0000 mg | Freq: Once | INTRAMUSCULAR | Status: AC
  Administered 2020-11-28: 200 mg via INTRAVENOUS
  Filled 2020-11-28: qty 200

## 2020-11-28 MED ORDER — FENTANYL CITRATE (PF) 100 MCG/2ML IJ SOLN
50.0000 ug | Freq: Once | INTRAMUSCULAR | Status: AC
  Administered 2020-11-28: 50 ug via INTRAVENOUS
  Filled 2020-11-28: qty 2

## 2020-11-28 MED ORDER — STROKE: EARLY STAGES OF RECOVERY BOOK
Freq: Once | Status: AC
  Filled 2020-11-28: qty 1

## 2020-11-28 MED ORDER — CARVEDILOL 25 MG PO TABS
25.0000 mg | ORAL_TABLET | Freq: Two times a day (BID) | ORAL | Status: DC
  Administered 2020-11-29: 25 mg via ORAL
  Filled 2020-11-28 (×2): qty 1

## 2020-11-28 MED ORDER — TRAMADOL HCL 50 MG PO TABS
50.0000 mg | ORAL_TABLET | Freq: Three times a day (TID) | ORAL | Status: DC | PRN
  Administered 2020-11-28: 50 mg via ORAL
  Filled 2020-11-28: qty 1

## 2020-11-28 MED ORDER — DIPHENHYDRAMINE HCL 25 MG PO CAPS: 50.0000 mg | ORAL_CAPSULE | Freq: Once | ORAL | Status: AC

## 2020-11-28 MED ORDER — PANTOPRAZOLE SODIUM 40 MG PO TBEC
40.0000 mg | DELAYED_RELEASE_TABLET | Freq: Two times a day (BID) | ORAL | Status: DC
  Administered 2020-11-28 – 2020-11-29 (×2): 40 mg via ORAL
  Filled 2020-11-28 (×2): qty 1

## 2020-11-28 MED ORDER — ACETAMINOPHEN 650 MG RE SUPP: 650.0000 mg | RECTAL | Status: DC | PRN

## 2020-11-28 MED ORDER — TEMAZEPAM 15 MG PO CAPS
15.0000 mg | ORAL_CAPSULE | Freq: Every evening | ORAL | Status: DC | PRN
  Administered 2020-11-28: 15 mg via ORAL
  Filled 2020-11-28: qty 1

## 2020-11-28 MED ORDER — ACETAMINOPHEN 160 MG/5ML PO SOLN: 650.0000 mg | ORAL | Status: DC | PRN

## 2020-11-28 MED ORDER — DIPHENHYDRAMINE HCL 50 MG/ML IJ SOLN
50.0000 mg | Freq: Once | INTRAMUSCULAR | Status: AC
  Administered 2020-11-28: 50 mg via INTRAVENOUS
  Filled 2020-11-28: qty 1

## 2020-11-28 NOTE — ED Notes (Signed)
Took pt to the bathroom via wheelchair  

## 2020-11-28 NOTE — ED Notes (Signed)
Pt requesting to take her own 4pm meds; Dr. Denton Brick messaged and gave permission for pt to take her own meds;    Pt took out of her meds: Atorvastatin 40mg  protonix 40mg  Carvedilol 25mg  eliquis 5mg 

## 2020-11-28 NOTE — ED Triage Notes (Signed)
Pt c/o right side neck pain x 3 days with worsening of sx today; pt states the pain has caused her to have a headache

## 2020-11-28 NOTE — H&P (Addendum)
History and Physical    [name] UEA:[id number] DOB: 1940-12-25 DOA: 11/28/2020  PCP: Dorothyann Peng, NP   Patient coming from: Home  I have personally briefly reviewed patient's old medical records in [place]  Chief Complaint: Vertigo  HPI: [name] is a 80 y.o. female with medical history significant for PAF, PE, BPPV, HTN, Stroke, d CHF. History is somewhat limited from patient as she is a poor historian.  Patient presented to the ED mostly with complaints of headache, neck pain of 3 days duration.  Symptoms are especially worse when she bends over.  She reports intermittent acute on chronic dizziness over the past few days, and with further questioning she reports somewhat being weak on her right upper and lower extremities also over the past 3 days.  She reports history of prior stroke with residual change in speech and difficulty with swallowing-for which she eats a regular diet but chopped up into small bits of by her son.  Both of these are unchanged. She denies difficulty breathing, no chest pain, no cough, denies pain with urination.  ED Course: Blood pressure systolic up to 478G otherwise stable vitals.  Potassium 3.2.  Troponins unremarkable.  Chest x-ray shows pleural effusion, atelectasis/consolidation.  EKG shows atrial fibrillation.  Head CT shows small lacunar infarct in right thalamus which is new from prior age-indeterminate MRI recommended.  Reported allergy to IV contrast, she was premedicated with 50 mg Benadryl and 200 mg Solu-Cortef.  CTA head and neck shows severe stenosis in the right vertebral artery origin. Hospitalist to admit for possible stroke.  Review of Systems: As per HPI all other systems reviewed and negative.  Past Medical History:  Diagnosis Date  . Anemia 2005   Generally microcytic, transfusions in [address], 2012, 02/2015, 05/2015  . Aortic stenosis    Moderate November 2017  . Arthritis   . Broken back 2013   Chronic back pain.    [name] CAD (coronary artery disease)    Cardiac catheterization 2014 - 80% mid RCA and 70% OM managed medically  . CHF (congestive heart failure) ([facility])   . Chronic bilateral pleural effusions   . Chronic headache   . Contrast media allergy   . Diastolic heart failure ([place])   . Diverticulosis   . Esophageal cancer ([place]) 05/06/2015   Adenocarcinoma GE junction  . Facial paresthesia   . GERD (gastroesophageal reflux disease)   . H. pylori infection   . H/O iron deficiency    05-06-15 iron infusion ([facility])  . HH (hiatus hernia) 2008   Large with associated erosions  . Hypertension   . Paroxysmal atrial fibrillation ([name])   . PONV (postoperative nausea and vomiting)   . Pulmonary emboli ([place]) 2008, 2012  . Stroke ([place]) 1982  . Swallowing dysfunction     Past Surgical History:  Procedure Laterality Date  . APPENDECTOMY  1950s  . CARDIOVERSION N/A 07/08/2019   Procedure: CARDIOVERSION;  Surgeon: Pixie Casino, MD;  Location: [facility];  Service: Cardiovascular;  Laterality: N/A;  . CARPAL TUNNEL RELEASE Bilateral 1990s  . [place] SURGERY  2014  . CHOLECYSTECTOMY  1980s   open  . COLONOSCOPY N/A 02/17/2015   Procedure: COLONOSCOPY;  Surgeon: [name], MD;  Location: [facility];  Service: Endoscopy;  Laterality: N/A;  . ESOPHAGOGASTRODUODENOSCOPY N/A 02/16/2015   Procedure: ESOPHAGOGASTRODUODENOSCOPY (EGD);  Surgeon: [name], MD;  Location: [facility];  Service: Endoscopy;  Laterality: N/A;  . ESOPHAGOGASTRODUODENOSCOPY N/A 05/06/2015  Procedure: ESOPHAGOGASTRODUODENOSCOPY (EGD);  Surgeon: [name], MD;  Location: [facility] ENDOSCOPY;  Service: Endoscopy;  Laterality: N/A;  . EUS N/A 05/20/2015   Procedure: UPPER ENDOSCOPIC ULTRASOUND (EUS) LINEAR;  Surgeon: [name], MD;  Location: [facility];  Service: Endoscopy;  Laterality: N/A;  . exploratory lab  1950s or 1960s  . GIVENS CAPSULE STUDY N/A 05/06/2015   Procedure: GIVENS CAPSULE STUDY;  Surgeon: [name], MD;  Location: [facility] ENDOSCOPY;  Service: Endoscopy;  Laterality: N/A;  . KNEE ARTHROSCOPY WITH MEDIAL MENISECTOMY Left 04/18/2018   Procedure: LEFT KNEE ARTHROSCOPY WITH PARTIAL MEDIAL MENISECTOMY AND ANTERIOR CRUCIATE LIGAMENT DEBRIDEMENT;  Surgeon: [name], MD;  Location: [facility];  Service: Orthopedics;  Laterality: Left;  . LEFT HEART CATH AND CORONARY ANGIOGRAPHY N/A 11/08/2016   Procedure: Left Heart Cath and Coronary Angiography;  Surgeon: [name], MD;  Location: [facility];  Service: Cardiovascular;  Laterality: N/A;  . LEFT HEART CATH AND CORONARY ANGIOGRAPHY N/A 08/19/2019   Procedure: LEFT HEART CATH AND CORONARY ANGIOGRAPHY;  Surgeon: Martinique, Peter M, MD;  Location: [facility];  Service: Cardiovascular;  Laterality: N/A;  . lumbar back surgery  2012  . [place] SURGERY  1991  . TONSILLECTOMY AND ADENOIDECTOMY  1960s     reports that she has never smoked. She has never used smokeless tobacco. She reports that she does not drink alcohol and does not use drugs.  Allergies  Allergen Reactions  . Iodinated Diagnostic Agents Anaphylaxis and Other (See Comments)    IPD dye Info given by patient  . Iodine Anaphylaxis  . Ioxaglate Anaphylaxis and Other (See Comments)  . Lactose Intolerance (Gi) Anaphylaxis  . Red Dye Anaphylaxis  . Hydralazine Anxiety and Other (See Comments)    Facial flushing, pt prefers not to use it.   . Milk-Related Compounds Other (See Comments)    Lactose intolerance Can tolerate milk if its cooked into the recipe, just can't drink milk   . Propoxyphene Rash and Anxiety    Family History  Problem Relation Age of Onset  . Stroke Mother   . Heart disease Mother   . Emphysema Father   . Liver disease Sister   . Ovarian cancer Sister   . Stroke Sister   . Other Child        died at birth  . Colon cancer Neg Hx   . Esophageal cancer Neg Hx   . Stomach cancer Neg Hx   . Pancreatic cancer Neg Hx     Prior to  Admission medications   Medication Sig Start Date End Date Taking? Authorizing Provider  acetaminophen (TYLENOL) 500 MG tablet Take 500 mg by mouth daily as needed for mild pain or moderate pain.     [provider]  amLODipine (NORVASC) 5 MG tablet TAKE (1) TABLET BY MOUTH ONCE DAILY. 06/23/20   Hilty, Nadean Corwin, MD  apixaban (ELIQUIS) 5 MG TABS tablet Take 1 tablet (5 mg total) by mouth 2 (two) times daily. 07/20/20 10/18/20  Nafziger, Tommi Rumps, NP  atorvastatin (LIPITOR) 40 MG tablet TAKE 1 TABLET BY MOUTH ONCE DAILY AT 6:00 PM. 05/26/20   Nafziger, Tommi Rumps, NP  buPROPion (WELLBUTRIN XL) 150 MG 24 hr tablet TAKE (1) TABLET BY MOUTH ONCE DAILY. 11/23/20   Nafziger, Tommi Rumps, NP  carvedilol (COREG) 25 MG tablet Take 1 tablet (25 mg total) by mouth 2 (two) times daily with a meal. 05/26/20 08/24/20  Dorothyann Peng, NP  cholecalciferol (VITAMIN  D3) 25 MCG (1000 UNIT) tablet Take 1,000 Units by mouth daily.    [provider]  furosemide (LASIX) 40 MG tablet TAKE 1 TABLET BY MOUTH ONCE DAILY. 06/24/20   Nafziger, Tommi Rumps, NP  meclizine (ANTIVERT) 25 MG tablet Take 1 tablet (25 mg total) by mouth as needed for dizziness or nausea. Patient taking differently: Take 25 mg by mouth 3 (three) times daily as needed for dizziness or nausea. 08/02/18   Nafziger, Tommi Rumps, NP  Melatonin 5 MG CAPS Take 10 mg by mouth at bedtime.    [provider]  methylPREDNISolone (MEDROL DOSEPAK) 4 MG TBPK tablet Take as directed 08/25/20   Dorothyann Peng, NP  pantoprazole (PROTONIX) 40 MG tablet Take 1 tablet (40 mg total) by mouth 2 (two) times daily. 06/24/20 09/22/20  Dorothyann Peng, NP  Pediatric Multiple Vitamins (FLINTSTONES MULTIVITAMIN PO) Take 1 tablet by mouth every evening.    [provider]  temazepam (RESTORIL) 15 MG capsule TAKE (1) CAPSULE BY MOUTH AT BEDTIME. 11/23/20   Nafziger, Tommi Rumps, NP  traMADol (ULTRAM) 50 MG tablet TAKE (1) TABLET BY MOUTH EVERY 8 HOURS. 10/19/20   Nafziger, Tommi Rumps, NP  vitamin  C (ASCORBIC ACID) 500 MG tablet Take 500 mg by mouth daily.    [provider]    Physical Exam: Vitals:   11/28/20 1130 11/28/20 1300 11/28/20 1411 11/28/20 1430  BP: (!) 109/57 137/63 (!) 152/85 128/64  Pulse: 70 99 81 83  Resp: 15 17 15 15   Temp:      SpO2: 94% 96% 96% 95%  Weight:      Height:        Constitutional: NAD, calm, comfortable Vitals:   11/28/20 1130 11/28/20 1300 11/28/20 1411 11/28/20 1430  BP: (!) 109/57 137/63 (!) 152/85 128/64  Pulse: 70 99 81 83  Resp: 15 17 15 15   Temp:      SpO2: 94% 96% 96% 95%  Weight:      Height:       Eyes: PERRL, lids and conjunctivae normal ENMT: Mucous membranes are moist. .  Neck: normal, supple, no masses, no thyromegaly Respiratory: clear to auscultation bilaterally, no wheezing, no crackles. Normal respiratory effort. No accessory muscle use.  Cardiovascular: Irregular rate and rhythm, no murmurs / rubs / gallops. No extremity edema. 2+ pedal pulses.  Abdomen: no tenderness, no masses palpated. No hepatosplenomegaly. Bowel sounds positive.  Musculoskeletal: no clubbing / cyanosis. No joint deformity upper and lower extremities. Good ROM, no contractures. Normal muscle tone.  Skin: no rashes, lesions, ulcers. No induration Neurologic:  Neurological:     Mental Status: She is alert.     GCS: GCS eye subscore is 4. GCS verbal subscore is 5. GCS motor subscore is 6.     Comments: Mental Status:  Alert, oriented, thought content appropriate, able to give a coherent history. Speech fluent without evidence of aphasia. Able to follow 2 step commands without difficulty.  Cranial Nerves:  II:  Peripheral visual fields grossly normal, pupils equal, round, reactive to light III,IV, VI: ptosis not present, extra-ocular motions intact bilaterally  V,VII: smile symmetric, eyebrows raise symmetric, facial light touch sensation equal VIII: hearing grossly normal to voice  X: uvula elevates symmetrically  XI: bilateral shoulder  shrug symmetric and strong XII: midline tongue extension without fassiculations Motor:  Normal tone.    5/5 strength in bilateral upper and lower extremities, but right side grip strength is slightly- weak compared to left. Sensory: Sensation intact to light touch in  all extremities.  Cerebellar:   CV: distal pulses palpable throughout   Psychiatric: Normal judgment and insight. Alert and oriented x 3. Normal mood.   Labs on Admission: I have personally reviewed following labs and imaging studies  CBC: Recent Labs  Lab 11/28/20 0936  WBC 5.3  NEUTROABS 3.6  HGB 11.7*  HCT 34.8*  MCV 86.6  PLT 078*   Basic Metabolic Panel: Recent Labs  Lab 11/28/20 0936  NA 134*  K 3.2*  CL 96*  CO2 30  GLUCOSE 167*  BUN 16  CREATININE 0.91  CALCIUM 8.4*   Liver Function Tests: Recent Labs  Lab 11/28/20 0936  AST 16  ALT 16  ALKPHOS 104  BILITOT 1.1  PROT 6.9  ALBUMIN 3.8    Radiological Exams on Admission: CT Angio Head W or Wo Contrast  Result Date: 11/28/2020 CLINICAL DATA:  Vertigo. EXAM: CT ANGIOGRAPHY HEAD AND NECK TECHNIQUE: Multidetector CT imaging of the head and neck was performed using the standard protocol during bolus administration of intravenous contrast. Multiplanar CT image reconstructions and MIPs were obtained to evaluate the vascular anatomy. Carotid stenosis measurements (when applicable) are obtained utilizing NASCET criteria, using the distal internal carotid diameter as the denominator. CONTRAST:  73mL OMNIPAQUE IOHEXOL 350 MG/ML SOLN COMPARISON:  CT head March 09, 2019. MRI March 07, 2019. FINDINGS: CT HEAD FINDINGS Brain: Small lacunar infarct in the right thalamus (series 4, image 16 which is new from the priors, but otherwise age indeterminate. Evolution of the previously seen right corona radiata infarct with encephalomalacia. Similar additional remote right cerebellar and right corona radiata infarcts infarct. No acute hemorrhage. No hydrocephalus,  mass lesion, abnormal mass effect, or sizable extra-axial fluid collection. Vascular: See below. Skull: No acute fracture Sinuses: Moderate left maxillary and ethmoid air cell mucosal thickening with left maxillary air-fluid level. Orbits: No acute abnormality. Review of the MIP images confirms the above findings CTA NECK FINDINGS Aortic arch: Great vessel origins are patent. Approximately 50% stenosis of the left subclavian artery origin due to calcific atherosclerosis. Right carotid system: Mixed calcific and noncalcific atherosclerosis at the carotid bifurcation involving the proximal internal carotid artery without greater than 50% stenosis. Left carotid system: Mixed calcific and noncalcific atherosclerosis with approximately 40% stenosis of the proximal ICA. Vertebral arteries: Both vertebral arteries arise from the subclavian arteries. Severe stenosis of the right vertebral artery origin. Mild-to-moderate stenosis of the right vertebral artery at approximately C3-C4. Mild stenosis of the left vertebral artery at C3-C4. Skeleton: Similar postoperative changes of anterior screw and plate fixation and intervertebral fusion at C3-C4 and C5-C6. Similar alignment. Other neck: No acute abnormality. Upper chest: Biapical pleuroparenchymal scarring. Otherwise, lung apices are clear. Review of the MIP images confirms the above findings CTA HEAD FINDINGS Anterior circulation: Predominately calcific atherosclerosis of bilateral cavernous and paraclinoid ICAs without greater than 50% stenosis. Patent MCAs and ACAs without proximal hemodynamically significant stenosis. Early right MCA bifurcation. No aneurysm. Posterior circulation: Mild stenosis of the proximal dominant left intradural vertebral artery. Patent non dominant right intradural vertebral artery. Patent basilar artery and bilateral posterior cerebral arteries. Mild-to-moderate stenosis of the proximal left P2 PCA Venous sinuses: As permitted by contrast timing,  patent. Small left transverse sinus. Arachnoid granulations in the superior sagittal sinus. Review of the MIP images confirms the above findings IMPRESSION: CT Head: 1. Small lacunar infarct in the right thalamus which is new from the priors, but otherwise age indeterminate. Recommend MRI to better evaluate. 2. Evolution of previously seen right  corona radiata infarct with similar additional remote lacunar infarcts. 3. Moderate left maxillary and ethmoid air cell mucosal thickening with left maxillary air-fluid level. CTA head: 1. No large vessel occlusion. 2. Mild to moderate proximal left intradural vertebral artery and left P2 PCA stenosis. CTA neck: 1. Severe stenosis of the right vertebral artery origin. Mild to moderate right and mild left vertebral artery stenosis at C3-C4. 2. Approximately 50% stenosis of the left subclavian artery origin. 3. Bilateral carotid bifurcation atherosclerosis with approximately 40% stenosis of the proximal left ICA. Findings discussed with Dr. Sabra Heck via telephone at 3:44 PM. Electronically Signed   By: Margaretha Sheffield MD   On: 11/28/2020 15:45   CT Angio Neck W and/or Wo Contrast  Result Date: 11/28/2020 CLINICAL DATA:  Vertigo. EXAM: CT ANGIOGRAPHY HEAD AND NECK TECHNIQUE: Multidetector CT imaging of the head and neck was performed using the standard protocol during bolus administration of intravenous contrast. Multiplanar CT image reconstructions and MIPs were obtained to evaluate the vascular anatomy. Carotid stenosis measurements (when applicable) are obtained utilizing NASCET criteria, using the distal internal carotid diameter as the denominator. CONTRAST:  72mL OMNIPAQUE IOHEXOL 350 MG/ML SOLN COMPARISON:  CT head March 09, 2019. MRI March 07, 2019. FINDINGS: CT HEAD FINDINGS Brain: Small lacunar infarct in the right thalamus (series 4, image 16 which is new from the priors, but otherwise age indeterminate. Evolution of the previously seen right corona radiata  infarct with encephalomalacia. Similar additional remote right cerebellar and right corona radiata infarcts infarct. No acute hemorrhage. No hydrocephalus, mass lesion, abnormal mass effect, or sizable extra-axial fluid collection. Vascular: See below. Skull: No acute fracture Sinuses: Moderate left maxillary and ethmoid air cell mucosal thickening with left maxillary air-fluid level. Orbits: No acute abnormality. Review of the MIP images confirms the above findings CTA NECK FINDINGS Aortic arch: Great vessel origins are patent. Approximately 50% stenosis of the left subclavian artery origin due to calcific atherosclerosis. Right carotid system: Mixed calcific and noncalcific atherosclerosis at the carotid bifurcation involving the proximal internal carotid artery without greater than 50% stenosis. Left carotid system: Mixed calcific and noncalcific atherosclerosis with approximately 40% stenosis of the proximal ICA. Vertebral arteries: Both vertebral arteries arise from the subclavian arteries. Severe stenosis of the right vertebral artery origin. Mild-to-moderate stenosis of the right vertebral artery at approximately C3-C4. Mild stenosis of the left vertebral artery at C3-C4. Skeleton: Similar postoperative changes of anterior screw and plate fixation and intervertebral fusion at C3-C4 and C5-C6. Similar alignment. Other neck: No acute abnormality. Upper chest: Biapical pleuroparenchymal scarring. Otherwise, lung apices are clear. Review of the MIP images confirms the above findings CTA HEAD FINDINGS Anterior circulation: Predominately calcific atherosclerosis of bilateral cavernous and paraclinoid ICAs without greater than 50% stenosis. Patent MCAs and ACAs without proximal hemodynamically significant stenosis. Early right MCA bifurcation. No aneurysm. Posterior circulation: Mild stenosis of the proximal dominant left intradural vertebral artery. Patent non dominant right intradural vertebral artery. Patent  basilar artery and bilateral posterior cerebral arteries. Mild-to-moderate stenosis of the proximal left P2 PCA Venous sinuses: As permitted by contrast timing, patent. Small left transverse sinus. Arachnoid granulations in the superior sagittal sinus. Review of the MIP images confirms the above findings IMPRESSION: CT Head: 1. Small lacunar infarct in the right thalamus which is new from the priors, but otherwise age indeterminate. Recommend MRI to better evaluate. 2. Evolution of previously seen right corona radiata infarct with similar additional remote lacunar infarcts. 3. Moderate left maxillary and ethmoid air cell mucosal  thickening with left maxillary air-fluid level. CTA head: 1. No large vessel occlusion. 2. Mild to moderate proximal left intradural vertebral artery and left P2 PCA stenosis. CTA neck: 1. Severe stenosis of the right vertebral artery origin. Mild to moderate right and mild left vertebral artery stenosis at C3-C4. 2. Approximately 50% stenosis of the left subclavian artery origin. 3. Bilateral carotid bifurcation atherosclerosis with approximately 40% stenosis of the proximal left ICA. Findings discussed with Dr. Sabra Heck via telephone at 3:44 PM. Electronically Signed   By: Margaretha Sheffield MD   On: 11/28/2020 15:45   DG Chest Portable 1 View  Result Date: 11/28/2020 CLINICAL DATA:  Chest pain EXAM: PORTABLE CHEST 1 VIEW COMPARISON:  03/15/2018 FINDINGS: Small right pleural effusion and associated atelectasis or consolidation. The left lung is normally aerated. Cardiomegaly. IMPRESSION: 1. Small right pleural effusion and associated atelectasis or consolidation. The left lung is normally aerated. 2.  Cardiomegaly. Electronically Signed   By: Eddie Candle M.D.   On: 11/28/2020 09:12    EKG: Independently reviewed.  Atrial fibrillation rate 72.  QTc 439.  No significant change from prior.  Assessment/Plan Principal Problem:   Vertigo Active Problems:   Long term current use of  anticoagulant therapy   Chronic diastolic congestive heart failure ([name])   Uncontrolled hypertension   Benign paroxysmal positional vertigo   History of pulmonary embolism   Persistent atrial fibrillation ([name])  Vertigo-with mild right-sided weakness- with BPPV hx. Head CT shows new, age- indeterminate infarct in right thalamus, also shows remote infarcts.  CTA head and neck done- shows severe stenosis of the right vertebral artery origin. (Contrast allergy, premedicated with Benadryl and Solu-Cortef and tolerated contrast in ED ) -Obtain brain MRI - HgbA1c, Lipid panel in a.m -Pending MRI findings, pls consult neurology in the morning -Obtain echocardiogram -Resume Eliquis, atorvastatin -Physical therapy evaluation -Swallow evaluation -Allow for permissive hypertension, as needed labetalol for heart rate greater than 200.  Atrial fibrillation on chronic anticoagulation and rate controlled  - Resume Eliquis, carvedilol for rate control  Diastolic CHF-.  Stable and compensated.  Last echo EF greater than 65%. -Hold Lasix for now to allow for permissive hypertension   Hypertension- blood pressure ranging from 109-180. -Allow for permissive hypertension, hold Norvasc, Lasix -Patient requesting to take her own home medications, declined hospital medications.   DVT prophylaxis: Eliquis Code Status: DNR, confirmed with patient at bedside. Family Communication: None at bedside. Son [name], who patient lives with is HCPOA.  Disposition Plan: ~ 1 - 2 days Consults called: Please consult neurology in the morning pending MRI findings Admission status: Observation, telemetry   Bethena Roys MD [facility]  11/28/2020, 7:33 PM

## 2020-11-28 NOTE — ED Notes (Signed)
Dr. Denton Brick in room with pt

## 2020-11-28 NOTE — ED Provider Notes (Signed)
Emergency Department Provider Note   I have reviewed the triage vital signs and the nursing notes.   HISTORY  Chief Complaint Neck Pain (Right side/)   HPI [name] is a 80 y.o. female with past medical history reviewed below presents to the emergency department with right-sided neck pain with some vertigo symptoms intermittently over the past 3 days.  Patient states that she has had intermittent pain which worsens with moving her head but sometimes is painful without movement.  She occasionally feels vertigo and some nausea.  The symptoms are not persistent.  She denies any numbness or weakness in the arms or legs.  No face numbness/weakness.  No vision symptoms.  She has been compliant with her anticoagulation for A. Fib.  She denies any fevers.  She does describe some occasional heaviness in her chest which is brief and has happened maybe 2 times in the past week but does not seem associated with her neck pain and headache symptoms. No falls or injury.     Past Medical History:  Diagnosis Date  . Anemia 2005   Generally microcytic, transfusions in [address], 2012, 02/2015, 05/2015  . Aortic stenosis    Moderate November 2017  . Arthritis   . Broken back 2013   Chronic back pain.   [name] CAD (coronary artery disease)    Cardiac catheterization 2014 - 80% mid RCA and 70% OM managed medically  . CHF (congestive heart failure) ([place])   . Chronic bilateral pleural effusions   . Chronic headache   . Contrast media allergy   . Diastolic heart failure ([place])   . Diverticulosis   . Esophageal cancer ([place]) 05/06/2015   Adenocarcinoma GE junction  . Facial paresthesia   . GERD (gastroesophageal reflux disease)   . H. pylori infection   . H/O iron deficiency    05-06-15 iron infusion ([facility])  . HH (hiatus hernia) 2008   Large with associated erosions  . Hypertension   . Paroxysmal atrial fibrillation ([name])   . PONV (postoperative nausea and vomiting)   . Pulmonary emboli ([place]) 2008,  2012  . Stroke ([place]) 1982  . Swallowing dysfunction     Patient Active Problem List   Diagnosis Date Noted  . Vertigo 11/28/2020  . Persistent atrial fibrillation ([place])   . Epistaxis, recurrent 03/20/2019  . Stroke ([place]) 03/07/2019  . S/P left knee arthroscopy 04/18/18 06/06/2018  . Acute medial meniscus tear, left, subsequent encounter 04/18/2018  . Bilateral pleural effusion   . Peripheral neuropathy 12/21/2017  . CAD (coronary artery disease) 12/19/2017  . Numbness of face 01/09/2017  . Unstable angina ([place]) 11/02/2016  . B12 deficiency 10/16/2016  . Hyperglycemia 06/26/2016  . Encounter for medication review and counseling 06/07/2016  . De Quervain's disease (radial styloid tenosynovitis) 03/23/2016  . Trigger finger, left middle finger 03/23/2016  . Gastric adenocarcinoma ([place]) 07/13/2015  . Aortic stenosis, mild 06/23/2015  . Essential (primary) hypertension 06/23/2015  . History of CVA (cerebrovascular accident) 06/23/2015  . Malignant tumor of lower third of esophagus ([place]) 06/23/2015  . GE junction carcinoma ([place]) 05/14/2015  . [place] lesion, acute   . IDA (iron deficiency anemia)   . Mixed hyperlipidemia 05/05/2015  . GERD (gastroesophageal reflux disease) 05/05/2015  . Absolute anemia   . Osteoarthritis 04/16/2015  . History of pulmonary embolism 03/02/2015  . Benign neoplasm of descending colon 02/17/2015  . Diverticulosis of large intestine without diverticulitis 02/17/2015  . Esophageal stricture 02/16/2015  . Macular degeneration 09/01/2014  .  Benign paroxysmal positional vertigo   . Uncontrolled hypertension 08/24/2014  . Sleep disturbance 08/03/2014  . PAF (paroxysmal atrial fibrillation) ([place]) 01/21/2014  . Moises Terpstra term current use of anticoagulant therapy 01/21/2014  . Diastolic heart failure ([place]) 01/21/2014    Past Surgical History:  Procedure Laterality Date  . APPENDECTOMY  1950s  . CARDIOVERSION N/A 07/08/2019   Procedure: CARDIOVERSION;   Surgeon: Pixie Casino, MD;  Location: [facility];  Service: Cardiovascular;  Laterality: N/A;  . CARPAL TUNNEL RELEASE Bilateral 1990s  . [place] SURGERY  2014  . CHOLECYSTECTOMY  1980s   open  . COLONOSCOPY N/A 02/17/2015   Procedure: COLONOSCOPY;  Surgeon: [name], MD;  Location: [facility];  Service: Endoscopy;  Laterality: N/A;  . ESOPHAGOGASTRODUODENOSCOPY N/A 02/16/2015   Procedure: ESOPHAGOGASTRODUODENOSCOPY (EGD);  Surgeon: [name], MD;  Location: [facility];  Service: Endoscopy;  Laterality: N/A;  . ESOPHAGOGASTRODUODENOSCOPY N/A 05/06/2015   Procedure: ESOPHAGOGASTRODUODENOSCOPY (EGD);  Surgeon: [name], MD;  Location: [facility];  Service: Endoscopy;  Laterality: N/A;  . EUS N/A 05/20/2015   Procedure: UPPER ENDOSCOPIC ULTRASOUND (EUS) LINEAR;  Surgeon: [name], MD;  Location: [facility];  Service: Endoscopy;  Laterality: N/A;  . exploratory lab  1950s or 1960s  . GIVENS CAPSULE STUDY N/A 05/06/2015   Procedure: GIVENS CAPSULE STUDY;  Surgeon: [name], MD;  Location: [facility];  Service: Endoscopy;  Laterality: N/A;  . KNEE ARTHROSCOPY WITH MEDIAL MENISECTOMY Left 04/18/2018   Procedure: LEFT KNEE ARTHROSCOPY WITH PARTIAL MEDIAL MENISECTOMY AND ANTERIOR CRUCIATE LIGAMENT DEBRIDEMENT;  Surgeon: [name], MD;  Location: [facility];  Service: Orthopedics;  Laterality: Left;  . LEFT HEART CATH AND CORONARY ANGIOGRAPHY N/A 11/08/2016   Procedure: Left Heart Cath and Coronary Angiography;  Surgeon: [name], MD;  Location: [facility];  Service: Cardiovascular;  Laterality: N/A;  . LEFT HEART CATH AND CORONARY ANGIOGRAPHY N/A 08/19/2019   Procedure: LEFT HEART CATH AND CORONARY ANGIOGRAPHY;  Surgeon: Martinique, Peter M, MD;  Location: [facility];  Service: Cardiovascular;  Laterality: N/A;  . lumbar back surgery  2012  . [place] SURGERY  1991  . TONSILLECTOMY AND ADENOIDECTOMY  1960s     Allergies Iodinated diagnostic agents, Iodine, Ioxaglate, Lactose intolerance (gi), Red dye, Hydralazine, Milk-related compounds, and Propoxyphene  Family History  Problem Relation Age of Onset  . Stroke Mother   . Heart disease Mother   . Emphysema Father   . Liver disease Sister   . Ovarian cancer Sister   . Stroke Sister   . Other Child        died at birth  . Colon cancer Neg Hx   . Esophageal cancer Neg Hx   . Stomach cancer Neg Hx   . Pancreatic cancer Neg Hx     Social History Social History   Tobacco Use  . Smoking status: Never Smoker  . Smokeless tobacco: Never Used  Vaping Use  . Vaping Use: Never used  Substance Use Topics  . Alcohol use: No    Alcohol/week: 0.0 standard drinks  . Drug use: No    Review of Systems  Constitutional: No fever/chills Eyes: No visual changes. ENT: No sore throat. Cardiovascular: 1-2 episodes of chest pain. None currently.  Respiratory: Denies shortness of breath. Gastrointestinal: No abdominal pain.  No nausea, no vomiting.  No diarrhea.  No constipation. Genitourinary: Negative for dysuria. Musculoskeletal: Negative for back pain. Positive neck pain.  Skin: Negative  for rash. Neurological: Positive posterior HA and occasional vertigo. No numbness/weakness.   10-point ROS otherwise negative.  ____________________________________________   PHYSICAL EXAM:  VITAL SIGNS: ED Triage Vitals  Enc Vitals Group     BP 11/28/20 0835 (!) 150/64     Pulse Rate 11/28/20 0835 88     Resp 11/28/20 0835 18     Temp 11/28/20 0835 98.3 F (36.8 C)     Temp src --      SpO2 11/28/20 0835 97 %     Weight 11/28/20 0832 99 lb (44.9 kg)     Height 11/28/20 0832 5\' 2"  (1.575 m)   Constitutional: Alert and oriented. Well appearing and in no acute distress. Eyes: Conjunctivae are normal. PERRL. EOMI. No nystagmus.  Head: Atraumatic. Nose: No congestion/rhinnorhea. Mouth/Throat: Mucous membranes are moist.  Oropharynx  non-erythematous. Neck: No stridor. No cervical spine tenderness to palpation. Mild tenderness to palpation of the paracervical MSK on the right.  Cardiovascular: Normal rate, regular rhythm. Good peripheral circulation. Grossly normal heart sounds.   Respiratory: Normal respiratory effort.  No retractions. Lungs CTAB. Gastrointestinal: Soft and nontender. No distention.  Musculoskeletal: No lower extremity tenderness nor edema. No gross deformities of extremities. Neurologic:  Normal speech and language. No gross focal neurologic deficits are appreciated.  Skin:  Skin is warm, dry and intact. No rash noted.   ____________________________________________   LABS (all labs ordered are listed, but only abnormal results are displayed)  Labs Reviewed  COMPREHENSIVE METABOLIC PANEL - Abnormal; Notable for the following components:      Result Value   Sodium 134 (*)    Potassium 3.2 (*)    Chloride 96 (*)    Glucose, Bld 167 (*)    Calcium 8.4 (*)    All other components within normal limits  CBC WITH DIFFERENTIAL/PLATELET - Abnormal; Notable for the following components:   Hemoglobin 11.7 (*)    HCT 34.8 (*)    Platelets 123 (*)    All other components within normal limits  RESP PANEL BY RT-PCR (FLU A&B, COVID) ARPGX2  MAGNESIUM  LIPID PANEL  HEMOGLOBIN A1C  TROPONIN I (HIGH SENSITIVITY)  TROPONIN I (HIGH SENSITIVITY)   ____________________________________________  EKG   EKG Interpretation  Date/Time:  Sunday Nov 28 2020 09:15:38 EDT Ventricular Rate:  72 PR Interval:    QRS Duration: 82 QT Interval:  401 QTC Calculation: 439 R Axis:   40 Text Interpretation: Atrial fibrillation Similar to prior Confirmed by Nanda Quinton [id number]) on 11/28/2020 9:25:06 AM       ____________________________________________  RADIOLOGY  CT Angio Head W or Wo Contrast  Result Date: 11/28/2020 CLINICAL DATA:  Vertigo. EXAM: CT ANGIOGRAPHY HEAD AND NECK TECHNIQUE: Multidetector CT imaging  of the head and neck was performed using the standard protocol during bolus administration of intravenous contrast. Multiplanar CT image reconstructions and MIPs were obtained to evaluate the vascular anatomy. Carotid stenosis measurements (when applicable) are obtained utilizing NASCET criteria, using the distal internal carotid diameter as the denominator. CONTRAST:  3mL OMNIPAQUE IOHEXOL 350 MG/ML SOLN COMPARISON:  CT head March 09, 2019. MRI March 07, 2019. FINDINGS: CT HEAD FINDINGS Brain: Small lacunar infarct in the right thalamus (series 4, image 16 which is new from the priors, but otherwise age indeterminate. Evolution of the previously seen right corona radiata infarct with encephalomalacia. Similar additional remote right cerebellar and right corona radiata infarcts infarct. No acute hemorrhage. No hydrocephalus, mass lesion, abnormal mass effect, or sizable extra-axial fluid collection. Vascular:  See below. Skull: No acute fracture Sinuses: Moderate left maxillary and ethmoid air cell mucosal thickening with left maxillary air-fluid level. Orbits: No acute abnormality. Review of the MIP images confirms the above findings CTA NECK FINDINGS Aortic arch: Great vessel origins are patent. Approximately 50% stenosis of the left subclavian artery origin due to calcific atherosclerosis. Right carotid system: Mixed calcific and noncalcific atherosclerosis at the carotid bifurcation involving the proximal internal carotid artery without greater than 50% stenosis. Left carotid system: Mixed calcific and noncalcific atherosclerosis with approximately 40% stenosis of the proximal ICA. Vertebral arteries: Both vertebral arteries arise from the subclavian arteries. Severe stenosis of the right vertebral artery origin. Mild-to-moderate stenosis of the right vertebral artery at approximately C3-C4. Mild stenosis of the left vertebral artery at C3-C4. Skeleton: Similar postoperative changes of anterior screw and  plate fixation and intervertebral fusion at C3-C4 and C5-C6. Similar alignment. Other neck: No acute abnormality. Upper chest: Biapical pleuroparenchymal scarring. Otherwise, lung apices are clear. Review of the MIP images confirms the above findings CTA HEAD FINDINGS Anterior circulation: Predominately calcific atherosclerosis of bilateral cavernous and paraclinoid ICAs without greater than 50% stenosis. Patent MCAs and ACAs without proximal hemodynamically significant stenosis. Early right MCA bifurcation. No aneurysm. Posterior circulation: Mild stenosis of the proximal dominant left intradural vertebral artery. Patent non dominant right intradural vertebral artery. Patent basilar artery and bilateral posterior cerebral arteries. Mild-to-moderate stenosis of the proximal left P2 PCA Venous sinuses: As permitted by contrast timing, patent. Small left transverse sinus. Arachnoid granulations in the superior sagittal sinus. Review of the MIP images confirms the above findings IMPRESSION: CT Head: 1. Small lacunar infarct in the right thalamus which is new from the priors, but otherwise age indeterminate. Recommend MRI to better evaluate. 2. Evolution of previously seen right corona radiata infarct with similar additional remote lacunar infarcts. 3. Moderate left maxillary and ethmoid air cell mucosal thickening with left maxillary air-fluid level. CTA head: 1. No large vessel occlusion. 2. Mild to moderate proximal left intradural vertebral artery and left P2 PCA stenosis. CTA neck: 1. Severe stenosis of the right vertebral artery origin. Mild to moderate right and mild left vertebral artery stenosis at C3-C4. 2. Approximately 50% stenosis of the left subclavian artery origin. 3. Bilateral carotid bifurcation atherosclerosis with approximately 40% stenosis of the proximal left ICA. Findings discussed with Dr. Sabra Heck via telephone at 3:44 PM. Electronically Signed   By: Margaretha Sheffield MD   On: 11/28/2020 15:45    CT Angio Neck W and/or Wo Contrast  Result Date: 11/28/2020 CLINICAL DATA:  Vertigo. EXAM: CT ANGIOGRAPHY HEAD AND NECK TECHNIQUE: Multidetector CT imaging of the head and neck was performed using the standard protocol during bolus administration of intravenous contrast. Multiplanar CT image reconstructions and MIPs were obtained to evaluate the vascular anatomy. Carotid stenosis measurements (when applicable) are obtained utilizing NASCET criteria, using the distal internal carotid diameter as the denominator. CONTRAST:  51mL OMNIPAQUE IOHEXOL 350 MG/ML SOLN COMPARISON:  CT head March 09, 2019. MRI March 07, 2019. FINDINGS: CT HEAD FINDINGS Brain: Small lacunar infarct in the right thalamus (series 4, image 16 which is new from the priors, but otherwise age indeterminate. Evolution of the previously seen right corona radiata infarct with encephalomalacia. Similar additional remote right cerebellar and right corona radiata infarcts infarct. No acute hemorrhage. No hydrocephalus, mass lesion, abnormal mass effect, or sizable extra-axial fluid collection. Vascular: See below. Skull: No acute fracture Sinuses: Moderate left maxillary and ethmoid air cell mucosal thickening with left  maxillary air-fluid level. Orbits: No acute abnormality. Review of the MIP images confirms the above findings CTA NECK FINDINGS Aortic arch: Great vessel origins are patent. Approximately 50% stenosis of the left subclavian artery origin due to calcific atherosclerosis. Right carotid system: Mixed calcific and noncalcific atherosclerosis at the carotid bifurcation involving the proximal internal carotid artery without greater than 50% stenosis. Left carotid system: Mixed calcific and noncalcific atherosclerosis with approximately 40% stenosis of the proximal ICA. Vertebral arteries: Both vertebral arteries arise from the subclavian arteries. Severe stenosis of the right vertebral artery origin. Mild-to-moderate stenosis of the  right vertebral artery at approximately C3-C4. Mild stenosis of the left vertebral artery at C3-C4. Skeleton: Similar postoperative changes of anterior screw and plate fixation and intervertebral fusion at C3-C4 and C5-C6. Similar alignment. Other neck: No acute abnormality. Upper chest: Biapical pleuroparenchymal scarring. Otherwise, lung apices are clear. Review of the MIP images confirms the above findings CTA HEAD FINDINGS Anterior circulation: Predominately calcific atherosclerosis of bilateral cavernous and paraclinoid ICAs without greater than 50% stenosis. Patent MCAs and ACAs without proximal hemodynamically significant stenosis. Early right MCA bifurcation. No aneurysm. Posterior circulation: Mild stenosis of the proximal dominant left intradural vertebral artery. Patent non dominant right intradural vertebral artery. Patent basilar artery and bilateral posterior cerebral arteries. Mild-to-moderate stenosis of the proximal left P2 PCA Venous sinuses: As permitted by contrast timing, patent. Small left transverse sinus. Arachnoid granulations in the superior sagittal sinus. Review of the MIP images confirms the above findings IMPRESSION: CT Head: 1. Small lacunar infarct in the right thalamus which is new from the priors, but otherwise age indeterminate. Recommend MRI to better evaluate. 2. Evolution of previously seen right corona radiata infarct with similar additional remote lacunar infarcts. 3. Moderate left maxillary and ethmoid air cell mucosal thickening with left maxillary air-fluid level. CTA head: 1. No large vessel occlusion. 2. Mild to moderate proximal left intradural vertebral artery and left P2 PCA stenosis. CTA neck: 1. Severe stenosis of the right vertebral artery origin. Mild to moderate right and mild left vertebral artery stenosis at C3-C4. 2. Approximately 50% stenosis of the left subclavian artery origin. 3. Bilateral carotid bifurcation atherosclerosis with approximately 40% stenosis  of the proximal left ICA. Findings discussed with Dr. Sabra Heck via telephone at 3:44 PM. Electronically Signed   By: Margaretha Sheffield MD   On: 11/28/2020 15:45   DG Chest Portable 1 View  Result Date: 11/28/2020 CLINICAL DATA:  Chest pain EXAM: PORTABLE CHEST 1 VIEW COMPARISON:  03/15/2018 FINDINGS: Small right pleural effusion and associated atelectasis or consolidation. The left lung is normally aerated. Cardiomegaly. IMPRESSION: 1. Small right pleural effusion and associated atelectasis or consolidation. The left lung is normally aerated. 2.  Cardiomegaly. Electronically Signed   By: Eddie Candle M.D.   On: 11/28/2020 09:12    ____________________________________________   PROCEDURES  Procedure(s) performed:   Procedures  None ____________________________________________   INITIAL IMPRESSION / ASSESSMENT AND PLAN / ED COURSE  Pertinent labs & imaging results that were available during my care of the patient were reviewed by me and considered in my medical decision making (see chart for details).   Patient presents to the emergency department for evaluation of pain in the posterior neck with some headache and occasional vertigo symptoms.  On exam patient does have some tenderness in this area raising suspicion for possible MSK etiology.  She does describe, however, some vertigo and so I am considering the possibility of a vertebral artery dissection which would cause pain  and some vertigo.  She does have a listed contrast dye allergy.  We discussed this and states she had an anaphylactic reaction to dye given in the 1980s.  In review of imaging studies from epic I do see that in 2004 she received a CT scan of the chest with contrast and does not recall any reaction.  However, I will pretreat with steroid and Benadryl prior to CT imaging.  Chest discomfort sounds less concerning and not very persistent.  Will obtain chest x-ray along with screening troponin and EKG.  No active chest  discomfort.  Labs and imaging reviewed.  COVID-negative.  Troponin normal.  There is an age-indeterminate thalamic infarct.  Question if this is why the patient is having symptoms and feeling off balance.  No obvious ischemia in the posterior fossa.  Plan for admit for MRI and stroke work-up.  Discussed patient's case with [facility] to request admission. Patient and family (if present) updated with plan. Care transferred to [facility] service.  I reviewed all nursing notes, vitals, pertinent old records, EKGs, labs, imaging (as available).  ____________________________________________  FINAL CLINICAL IMPRESSION(S) / ED DIAGNOSES  Final diagnoses:  Stroke-like symptoms     MEDICATIONS GIVEN DURING THIS VISIT:  Medications  apixaban (ELIQUIS) tablet 5 mg (5 mg Oral Not Given 11/28/20 2257)  carvedilol (COREG) tablet 25 mg (has no administration in time range)  atorvastatin (LIPITOR) tablet 40 mg (has no administration in time range)  buPROPion (WELLBUTRIN XL) 24 hr tablet 150 mg (has no administration in time range)  pantoprazole (PROTONIX) EC tablet 40 mg (40 mg Oral Given 11/28/20 2256)  temazepam (RESTORIL) capsule 15 mg (15 mg Oral Given 11/28/20 2256)  traMADol (ULTRAM) tablet 50 mg (50 mg Oral Given 11/28/20 2256)  acetaminophen (TYLENOL) tablet 650 mg (650 mg Oral Given 11/29/20 0630)    Or  acetaminophen (TYLENOL) 160 MG/5ML solution 650 mg ( Per Tube See Alternative 11/29/20 0630)    Or  acetaminophen (TYLENOL) suppository 650 mg ( Rectal See Alternative 11/29/20 0630)  senna-docusate (Senokot-S) tablet 1 tablet (has no administration in time range)  LORazepam (ATIVAN) injection 0.5 mg (0 mg Intravenous Hold 11/28/20 2249)  labetalol (NORMODYNE) injection 10 mg (has no administration in time range)  hydrocortisone sodium succinate (SOLU-CORTEF) injection 200 mg (200 mg Intravenous Given 11/28/20 0928)  diphenhydrAMINE (BENADRYL) capsule 50 mg ( Oral See Alternative 11/28/20 1226)    Or   diphenhydrAMINE (BENADRYL) injection 50 mg (50 mg Intravenous Given 11/28/20 1226)  fentaNYL (SUBLIMAZE) injection 25 mcg (25 mcg Intravenous Given 11/28/20 1004)  iohexol (OMNIPAQUE) 350 MG/ML injection 75 mL (75 mLs Intravenous Contrast Given 11/28/20 1334)   stroke: mapping our early stages of recovery book ( Does not apply Given 11/28/20 2249)  fentaNYL (SUBLIMAZE) injection 50 mcg (50 mcg Intravenous Given 11/28/20 2006)    Note:  This document was prepared using Dragon voice recognition software and may include unintentional dictation errors.  Nanda Quinton, MD, [facility] Emergency Medicine    K[place]ner Warrior, [place], MD 11/29/20 [phone]

## 2020-11-28 NOTE — ED Notes (Signed)
Left grip slightly weaker than right. Left leg is hypersensitive to touch.

## 2020-11-28 NOTE — ED Notes (Signed)
Put pt on a 12 lead monitor 

## 2020-11-28 NOTE — ED Notes (Signed)
Pt given crackers, applesauce and water; son at bedside

## 2020-11-28 NOTE — ED Notes (Signed)
Assisted pt to bathroom, gait unsteady at times.

## 2020-11-28 NOTE — ED Notes (Signed)
Bathroom via wheelchair

## 2020-11-29 ENCOUNTER — Other Ambulatory Visit (HOSPITAL_COMMUNITY): Payer: Medicare Other

## 2020-11-29 ENCOUNTER — Observation Stay (HOSPITAL_COMMUNITY): Payer: Medicare Other

## 2020-11-29 DIAGNOSIS — R2990 Unspecified symptoms and signs involving the nervous system: Secondary | ICD-10-CM

## 2020-11-29 DIAGNOSIS — I5032 Chronic diastolic (congestive) heart failure: Secondary | ICD-10-CM | POA: Diagnosis not present

## 2020-11-29 DIAGNOSIS — I4819 Other persistent atrial fibrillation: Secondary | ICD-10-CM

## 2020-11-29 DIAGNOSIS — R42 Dizziness and giddiness: Secondary | ICD-10-CM

## 2020-11-29 DIAGNOSIS — Z7901 Long term (current) use of anticoagulants: Secondary | ICD-10-CM

## 2020-11-29 DIAGNOSIS — Z86711 Personal history of pulmonary embolism: Secondary | ICD-10-CM

## 2020-11-29 DIAGNOSIS — H8110 Benign paroxysmal vertigo, unspecified ear: Secondary | ICD-10-CM

## 2020-11-29 DIAGNOSIS — I10 Essential (primary) hypertension: Secondary | ICD-10-CM

## 2020-11-29 LAB — LIPID PANEL
Cholesterol: 156 mg/dL (ref 0–200)
HDL: 44 mg/dL (ref >40)
LDL Cholesterol: 99 mg/dL (ref 0–99)
Total CHOL/HDL Ratio: 3.5 RATIO
Triglycerides: 66 mg/dL (ref <150)
VLDL: 13 mg/dL (ref 0–40)

## 2020-11-29 LAB — VITAMIN B12: Vitamin B-12: 244 pg/mL (ref 180–914)

## 2020-11-29 MED ORDER — FUROSEMIDE 40 MG PO TABS: 40.0000 mg | ORAL_TABLET | Freq: Every day | ORAL | Status: DC

## 2020-11-29 MED ORDER — ATORVASTATIN CALCIUM 80 MG PO TABS: 80.0000 mg | ORAL_TABLET | Freq: Every day | ORAL | 0 refills | Status: DC

## 2020-11-29 NOTE — Discharge Summary (Signed)
Physician Discharge Summary  [name] GEX:[id number] DOB: 1941/06/11 DOA: 11/28/2020  PCP: Dorothyann Peng, NP  Admit date: 11/28/2020 Discharge date: 11/29/2020  Admitted From: Home Disposition: Home   Recommendations for Outpatient Follow-up:  1. Follow up with PCP in 1-2 weeks. Consider repeat lipid panel, lipitor dose increased to 80mg .  2. Follow up with neurology in 4-6 weeks for follow up of this nonacute CVA.   Home Health: None Equipment/Devices: None new Discharge Condition: Stable CODE STATUS: DNR Diet recommendation: Heart healthy  Brief/Interim Summary: [name] is an 80 y.o. female with a history of PAF on eliquis, stroke, PE, BPPV, HTN, chronic HFpEF who presented to the ED at [facility] 5/29 with several days of right posterior head pain and intermittent dizziness with some right-sided weakness. SBP 180's on arrival, potassium 3.2. CXR showed pleural effusion, atelectasis/consolidation. ECG showedatrial fibrillation. Head CT showed age-indeterminate small lacunar infarct in right thalamus which is new from prior. The patient was admitted to [facility] for MRI and neurology evaluation. MRI has shown no evidence of acute intracranial abnormality. The small right thalamic lacunar infarct seen on recent CT head is new from the prior MRI, but not acute. LDL is elevated at 99, so lipitor dose has been increased from 40mg  to 80mg  per neurology recommendations. The patient's symptoms are stable with no outpatient therapy recommended.    Discharge Diagnoses:  Principal Problem:   Vertigo Active Problems:   Long term current use of anticoagulant therapy   Diastolic heart failure ([name])   Uncontrolled hypertension   Benign paroxysmal positional vertigo   History of pulmonary embolism   Persistent atrial fibrillation ([name])  Age-indeterminate lacunar CVA with right sided-weakness: Does not appear to be acute based on MRI. - Neurology consulted, cleared patient for discharge after  MRI with no further inpatient work up planned.  - LDL 99, increase lipitor 40mg  > 80mg .  - Continue anticoagulation.   Chronic HFpEF, HTN: Euvolemic.  - Restart home medications.  Persistent atrial fibrillation:  - Continue coreg, eliquis.  History of PE:  - Continue anticoagulation as above   CRPS s/p left wrist fracture: Managed nonoperatively due to age/comorbidities.  - Continue outpatient management, home pain medications  Discharge Instructions Discharge Instructions    Diet - low sodium heart healthy   Complete by: As directed    Discharge instructions   Complete by: As directed    You were admitted with a stroke that occurred some time ago. Neurology has recommended outpatient therapy and to increase lipitor due to slightly elevated cholesterol levels. This will reduce your risk of another stroke. A new prescription for 80mg  dose has been sent to [facility]. You can also take two of your current 40mg  tablets.   Please follow up with neurology and your primary doctor as an outpatient after discharge, or seek medical attention right away if you develop new/worsening weakness, numbness or other concerns.   Increase activity slowly   Complete by: As directed      Allergies as of 11/29/2020      Reactions   Iodinated Diagnostic Agents Anaphylaxis, Other (See Comments)   IPD dye Info given by patient   Iodine Anaphylaxis   Ioxaglate Anaphylaxis, Other (See Comments)   Lactose Intolerance (gi) Anaphylaxis   Red Dye Anaphylaxis   Hydralazine Anxiety, Other (See Comments)   Facial flushing, pt prefers not to use it.    Milk-related Compounds Other (See Comments)   Lactose intolerance Can tolerate milk if its cooked into  the recipe, just can't drink milk    Propoxyphene Rash, Anxiety      Medication List    STOP taking these medications   methylPREDNISolone 4 MG Tbpk tablet Commonly known as: MEDROL DOSEPAK     TAKE these medications   acetaminophen 500 MG  tablet Commonly known as: TYLENOL Take 500 mg by mouth daily as needed for mild pain or moderate pain.   amLODipine 5 MG tablet Commonly known as: NORVASC TAKE (1) TABLET BY MOUTH ONCE DAILY. What changed: See the new instructions.   apixaban 5 MG Tabs tablet Commonly known as: Eliquis Take 1 tablet (5 mg total) by mouth 2 (two) times daily.   atorvastatin 80 MG tablet Commonly known as: Lipitor Take 1 tablet (80 mg total) by mouth daily. What changed:   medication strength  See the new instructions.   buPROPion 150 MG 24 hr tablet Commonly known as: WELLBUTRIN XL TAKE (1) TABLET BY MOUTH ONCE DAILY. What changed: See the new instructions.   carvedilol 25 MG tablet Commonly known as: COREG Take 1 tablet (25 mg total) by mouth 2 (two) times daily with a meal.   cholecalciferol 25 MCG (1000 UNIT) tablet Commonly known as: VITAMIN D3 Take 1,000 Units by mouth daily.   FLINTSTONES MULTIVITAMIN PO Take 1 tablet by mouth every evening.   furosemide 40 MG tablet Commonly known as: LASIX TAKE 1 TABLET BY MOUTH ONCE DAILY.   meclizine 25 MG tablet Commonly known as: ANTIVERT Take 1 tablet (25 mg total) by mouth as needed for dizziness or nausea. What changed: when to take this   Melatonin 5 MG Caps Take 10 mg by mouth at bedtime.   pantoprazole 40 MG tablet Commonly known as: PROTONIX Take 1 tablet (40 mg total) by mouth 2 (two) times daily.   temazepam 15 MG capsule Commonly known as: RESTORIL TAKE (1) CAPSULE BY MOUTH AT BEDTIME. What changed: See the new instructions.   traMADol 50 MG tablet Commonly known as: ULTRAM TAKE (1) TABLET BY MOUTH EVERY 8 HOURS. What changed: See the new instructions.   vitamin C 500 MG tablet Commonly known as: ASCORBIC ACID Take 500 mg by mouth daily.       Follow-up Information    Dorothyann Peng, NP. Schedule an appointment as soon as possible for a visit.   Specialty: Family Medicine Contact information: [address] [place] [address] [phone]        Pixie Casino, MD .   Specialty: Cardiology Contact information: [address] [place] [address] [phone]        [facility]. Schedule an appointment as soon as possible for a visit in 4 week(s).   Specialty: Neurology Contact information: [place] [phone]             Allergies  Allergen Reactions  . Iodinated Diagnostic Agents Anaphylaxis and Other (See Comments)    IPD dye Info given by patient  . Iodine Anaphylaxis  . Ioxaglate Anaphylaxis and Other (See Comments)  . Lactose Intolerance (Gi) Anaphylaxis  . Red Dye Anaphylaxis  . Hydralazine Anxiety and Other (See Comments)    Facial flushing, pt prefers not to use it.   . Milk-Related Compounds Other (See Comments)    Lactose intolerance Can tolerate milk if its cooked into the recipe, just can't drink milk   . Propoxyphene Rash and Anxiety    Consultations:  Neurology  Procedures/Studies: CT  Angio Head W or Wo Contrast  Result Date: 11/28/2020 CLINICAL DATA:  Vertigo. EXAM: CT ANGIOGRAPHY HEAD AND NECK TECHNIQUE: Multidetector CT imaging of the head and neck was performed using the standard protocol during bolus administration of intravenous contrast. Multiplanar CT image reconstructions and MIPs were obtained to evaluate the vascular anatomy. Carotid stenosis measurements (when applicable) are obtained utilizing NASCET criteria, using the distal internal carotid diameter as the denominator. CONTRAST:  20mL OMNIPAQUE IOHEXOL 350 MG/ML SOLN COMPARISON:  CT head March 09, 2019. MRI March 07, 2019. FINDINGS: CT HEAD FINDINGS Brain: Small lacunar infarct in the right thalamus (series 4, image 16 which is new from the priors, but otherwise age indeterminate. Evolution of the previously seen right corona radiata infarct with encephalomalacia. Similar additional  remote right cerebellar and right corona radiata infarcts infarct. No acute hemorrhage. No hydrocephalus, mass lesion, abnormal mass effect, or sizable extra-axial fluid collection. Vascular: See below. Skull: No acute fracture Sinuses: Moderate left maxillary and ethmoid air cell mucosal thickening with left maxillary air-fluid level. Orbits: No acute abnormality. Review of the MIP images confirms the above findings CTA NECK FINDINGS Aortic arch: Great vessel origins are patent. Approximately 50% stenosis of the left subclavian artery origin due to calcific atherosclerosis. Right carotid system: Mixed calcific and noncalcific atherosclerosis at the carotid bifurcation involving the proximal internal carotid artery without greater than 50% stenosis. Left carotid system: Mixed calcific and noncalcific atherosclerosis with approximately 40% stenosis of the proximal ICA. Vertebral arteries: Both vertebral arteries arise from the subclavian arteries. Severe stenosis of the right vertebral artery origin. Mild-to-moderate stenosis of the right vertebral artery at approximately C3-C4. Mild stenosis of the left vertebral artery at C3-C4. Skeleton: Similar postoperative changes of anterior screw and plate fixation and intervertebral fusion at C3-C4 and C5-C6. Similar alignment. Other neck: No acute abnormality. Upper chest: Biapical pleuroparenchymal scarring. Otherwise, lung apices are clear. Review of the MIP images confirms the above findings CTA HEAD FINDINGS Anterior circulation: Predominately calcific atherosclerosis of bilateral cavernous and paraclinoid ICAs without greater than 50% stenosis. Patent MCAs and ACAs without proximal hemodynamically significant stenosis. Early right MCA bifurcation. No aneurysm. Posterior circulation: Mild stenosis of the proximal dominant left intradural vertebral artery. Patent non dominant right intradural vertebral artery. Patent basilar artery and bilateral posterior cerebral  arteries. Mild-to-moderate stenosis of the proximal left P2 PCA Venous sinuses: As permitted by contrast timing, patent. Small left transverse sinus. Arachnoid granulations in the superior sagittal sinus. Review of the MIP images confirms the above findings IMPRESSION: CT Head: 1. Small lacunar infarct in the right thalamus which is new from the priors, but otherwise age indeterminate. Recommend MRI to better evaluate. 2. Evolution of previously seen right corona radiata infarct with similar additional remote lacunar infarcts. 3. Moderate left maxillary and ethmoid air cell mucosal thickening with left maxillary air-fluid level. CTA head: 1. No large vessel occlusion. 2. Mild to moderate proximal left intradural vertebral artery and left P2 PCA stenosis. CTA neck: 1. Severe stenosis of the right vertebral artery origin. Mild to moderate right and mild left vertebral artery stenosis at C3-C4. 2. Approximately 50% stenosis of the left subclavian artery origin. 3. Bilateral carotid bifurcation atherosclerosis with approximately 40% stenosis of the proximal left ICA. Findings discussed with Dr. Sabra Heck via telephone at 3:44 PM. Electronically Signed   By: Margaretha Sheffield MD   On: 11/28/2020 15:45   CT Angio Neck W and/or Wo Contrast  Result Date: 11/28/2020 CLINICAL DATA:  Vertigo. EXAM: CT ANGIOGRAPHY HEAD  AND NECK TECHNIQUE: Multidetector CT imaging of the head and neck was performed using the standard protocol during bolus administration of intravenous contrast. Multiplanar CT image reconstructions and MIPs were obtained to evaluate the vascular anatomy. Carotid stenosis measurements (when applicable) are obtained utilizing NASCET criteria, using the distal internal carotid diameter as the denominator. CONTRAST:  47mL OMNIPAQUE IOHEXOL 350 MG/ML SOLN COMPARISON:  CT head March 09, 2019. MRI March 07, 2019. FINDINGS: CT HEAD FINDINGS Brain: Small lacunar infarct in the right thalamus (series 4, image 16  which is new from the priors, but otherwise age indeterminate. Evolution of the previously seen right corona radiata infarct with encephalomalacia. Similar additional remote right cerebellar and right corona radiata infarcts infarct. No acute hemorrhage. No hydrocephalus, mass lesion, abnormal mass effect, or sizable extra-axial fluid collection. Vascular: See below. Skull: No acute fracture Sinuses: Moderate left maxillary and ethmoid air cell mucosal thickening with left maxillary air-fluid level. Orbits: No acute abnormality. Review of the MIP images confirms the above findings CTA NECK FINDINGS Aortic arch: Great vessel origins are patent. Approximately 50% stenosis of the left subclavian artery origin due to calcific atherosclerosis. Right carotid system: Mixed calcific and noncalcific atherosclerosis at the carotid bifurcation involving the proximal internal carotid artery without greater than 50% stenosis. Left carotid system: Mixed calcific and noncalcific atherosclerosis with approximately 40% stenosis of the proximal ICA. Vertebral arteries: Both vertebral arteries arise from the subclavian arteries. Severe stenosis of the right vertebral artery origin. Mild-to-moderate stenosis of the right vertebral artery at approximately C3-C4. Mild stenosis of the left vertebral artery at C3-C4. Skeleton: Similar postoperative changes of anterior screw and plate fixation and intervertebral fusion at C3-C4 and C5-C6. Similar alignment. Other neck: No acute abnormality. Upper chest: Biapical pleuroparenchymal scarring. Otherwise, lung apices are clear. Review of the MIP images confirms the above findings CTA HEAD FINDINGS Anterior circulation: Predominately calcific atherosclerosis of bilateral cavernous and paraclinoid ICAs without greater than 50% stenosis. Patent MCAs and ACAs without proximal hemodynamically significant stenosis. Early right MCA bifurcation. No aneurysm. Posterior circulation: Mild stenosis of the  proximal dominant left intradural vertebral artery. Patent non dominant right intradural vertebral artery. Patent basilar artery and bilateral posterior cerebral arteries. Mild-to-moderate stenosis of the proximal left P2 PCA Venous sinuses: As permitted by contrast timing, patent. Small left transverse sinus. Arachnoid granulations in the superior sagittal sinus. Review of the MIP images confirms the above findings IMPRESSION: CT Head: 1. Small lacunar infarct in the right thalamus which is new from the priors, but otherwise age indeterminate. Recommend MRI to better evaluate. 2. Evolution of previously seen right corona radiata infarct with similar additional remote lacunar infarcts. 3. Moderate left maxillary and ethmoid air cell mucosal thickening with left maxillary air-fluid level. CTA head: 1. No large vessel occlusion. 2. Mild to moderate proximal left intradural vertebral artery and left P2 PCA stenosis. CTA neck: 1. Severe stenosis of the right vertebral artery origin. Mild to moderate right and mild left vertebral artery stenosis at C3-C4. 2. Approximately 50% stenosis of the left subclavian artery origin. 3. Bilateral carotid bifurcation atherosclerosis with approximately 40% stenosis of the proximal left ICA. Findings discussed with Dr. Sabra Heck via telephone at 3:44 PM. Electronically Signed   By: Margaretha Sheffield MD   On: 11/28/2020 15:45   MR BRAIN WO CONTRAST  Result Date: 11/29/2020 CLINICAL DATA:  Dizziness. EXAM: MRI HEAD WITHOUT CONTRAST TECHNIQUE: Multiplanar, multiecho pulse sequences of the brain and surrounding structures were obtained without intravenous contrast. COMPARISON:  MRI 03/07/2019.  CT Nov 28, 2020. FINDINGS: Brain: No evidence of acute infarct. Similar prior infarcts involving the right basal ganglia, hemispheric right greater than left white matter and right cerebellum. Small remote lacunar infarct in the right thalamus, new from the prior MRI. Moderate T2/FLAIR  hyperintensities within the white matter, compatible with chronic microvascular ischemic disease. No hydrocephalus, acute hemorrhage, extra-axial fluid collection, mass lesion or abnormal mass effect. Vascular: Major arterial flow voids are maintained at the skull base. Skull and upper cervical spine: Normal marrow signal. Sinuses/Orbits: Moderate mucosal thickening left anterior ethmoid air cells and the left maxillary sinus. Other: No mastoid effusions. IMPRESSION: 1. No evidence of acute intracranial abnormality. The small right thalamic lacunar infarct seen on recent CT head is new from the prior MRI, but not acute. 2. Moderate chronic microvascular ischemic disease with infratentorial and supratentorial remote infarcts. 3. Moderate left anterior ethmoid and maxillary sinus mucosal thickening. Electronically Signed   By: Margaretha Sheffield MD   On: 11/29/2020 13:11   DG Chest Portable 1 View  Result Date: 11/28/2020 CLINICAL DATA:  Chest pain EXAM: PORTABLE CHEST 1 VIEW COMPARISON:  03/15/2018 FINDINGS: Small right pleural effusion and associated atelectasis or consolidation. The left lung is normally aerated. Cardiomegaly. IMPRESSION: 1. Small right pleural effusion and associated atelectasis or consolidation. The left lung is normally aerated. 2.  Cardiomegaly. Electronically Signed   By: Eddie Candle M.D.   On: 11/28/2020 09:12     Subjective: Wants to get up, states her posterior headache is gone though around the top of her head still feels abnormal. Not numb, just weird. No other numbness, weakness anywhere.   Discharge Exam: Vitals:   11/29/20 0501 11/29/20 0854  BP: (!) 113/46 132/84  Pulse: 84 61  Resp: 18 20  Temp: 97.7 F (36.5 C) 98.3 F (36.8 C)  SpO2: 98% 98%   Gen: Thin elderly female in no distress Pulm: Non-labored breathing room air. Clear to auscultation bilaterally.  CV: Regular rate and rhythm. No murmur, rub, or gallop. No JVD, no pedal edema. GI: Abdomen soft,  non-tender, non-distended, with normoactive bowel sounds. No organomegaly or masses felt. Ext: Warm, no deformities Skin: No rashes, lesions or ulcers Neuro: Alert and oriented. No focal neurological deficits. Psych: Judgement and insight appear normal. Mood & affect appropriate.   Labs: Basic Metabolic Panel: Recent Labs  Lab 11/28/20 0936 11/28/20 1128  NA 134*  --   K 3.2*  --   CL 96*  --   CO2 30  --   GLUCOSE 167*  --   BUN 16  --   CREATININE 0.91  --   CALCIUM 8.4*  --   MG  --  2.0   Liver Function Tests: Recent Labs  Lab 11/28/20 0936  AST 16  ALT 16  ALKPHOS 104  BILITOT 1.1  PROT 6.9  ALBUMIN 3.8   CBC: Recent Labs  Lab 11/28/20 0936  WBC 5.3  NEUTROABS 3.6  HGB 11.7*  HCT 34.8*  MCV 86.6  PLT 123*   Lipid Profile Recent Labs    11/29/20 0107  CHOL 156  HDL 44  LDLCALC 99  TRIG 66  CHOLHDL 3.5   Microbiology Recent Results (from the past 240 hour(s))  Resp Panel by RT-PCR (Flu A&B, Covid) Nasopharyngeal Swab     Status: None   Collection Time: 11/28/20  3:52 PM   Specimen: Nasopharyngeal Swab; Nasopharyngeal(NP) swabs in vial transport medium  Result Value Ref Range Status   SARS Coronavirus 2  by RT PCR NEGATIVE NEGATIVE Final    Comment: (NOTE) SARS-CoV-2 target nucleic acids are NOT DETECTED.  The SARS-CoV-2 RNA is generally detectable in upper respiratory specimens during the acute phase of infection. The lowest concentration of SARS-CoV-2 viral copies this assay can detect is 138 copies/mL. A negative result does not preclude SARS-Cov-2 infection and should not be used as the sole basis for treatment or other patient management decisions. A negative result may occur with  improper specimen collection/handling, submission of specimen other than nasopharyngeal swab, presence of viral mutation(s) within the areas targeted by this assay, and inadequate number of viral copies(<138 copies/mL). A negative result must be combined  with clinical observations, patient history, and epidemiological information. The expected result is Negative.  Fact Sheet for Patients:  EntrepreneurPulse.com.au  Fact Sheet for Healthcare Providers:  IncredibleEmployment.be  This test is no t yet approved or cleared by the [place] FDA and  has been authorized for detection and/or diagnosis of SARS-CoV-2 by FDA under an Emergency Use Authorization (EUA). This EUA will remain  in effect (meaning this test can be used) for the duration of the COVID-19 declaration under Section 564(b)(1) of the Act, 21 U.S.C.section 360bbb-3(b)(1), unless the authorization is terminated  or revoked sooner.       Influenza A by PCR NEGATIVE NEGATIVE Final   Influenza B by PCR NEGATIVE NEGATIVE Final    Comment: (NOTE) The Xpert Xpress SARS-CoV-2/FLU/RSV plus assay is intended as an aid in the diagnosis of influenza from Nasopharyngeal swab specimens and should not be used as a sole basis for treatment. Nasal washings and aspirates are unacceptable for Xpert Xpress SARS-CoV-2/FLU/RSV testing.  Fact Sheet for Patients: EntrepreneurPulse.com.au  Fact Sheet for Healthcare Providers: IncredibleEmployment.be  This test is not yet approved or cleared by the [place] FDA and has been authorized for detection and/or diagnosis of SARS-CoV-2 by FDA under an Emergency Use Authorization (EUA). This EUA will remain in effect (meaning this test can be used) for the duration of the COVID-19 declaration under Section 564(b)(1) of the Act, 21 U.S.C. section 360bbb-3(b)(1), unless the authorization is terminated or revoked.  Performed at [facility], [address] [place], [place] [address]     Time coordinating discharge: Approximately 40 minutes  Patrecia Pour, MD  [facility] 11/29/2020, 2:44 PM

## 2020-11-29 NOTE — Care Management Obs Status (Signed)
[facility] NOTIFICATION   Patient Details  Name: [name] MRN: [id number] Date of Birth: 08-10-1940   Medicare Observation Status Notification Given:  Yes    Joanne Chars, LCSW 11/29/2020, 1:23 PM

## 2020-11-29 NOTE — Progress Notes (Signed)
Pt is discharged to home. DC instructions given with young female family member at bedside. No concerns voiced.Pt left unit in wheelchair pushed by Nurse tech Brenton accompanied by female family member. Left in stable conditon.

## 2020-11-29 NOTE — Evaluation (Signed)
Physical Therapy Evaluation Patient Details Name: [name] MRN: [id number] DOB: 09-20-1940 Today's Date: 11/29/2020   History of Present Illness  80 y.o. female presenting to ED on 5/29 with acute on chronic dizziness, weakness, headache and neck pain x3 days. CXR (+) pleural effusion, atelectasis/consolidation. EKG (+) A-fib. Head CT (+) age indeterminate small lacunar infarct in R thalamus. 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  Clinical Impression  Pt is at or close to baseline functioning and should be safe at home with limited available assist. There are no further acute PT needs.  Will sign off at this time.     Follow Up Recommendations No PT follow up    Equipment Recommendations  None recommended by PT    Recommendations for Other Services       Precautions / Restrictions Precautions Precautions: Fall      Mobility  Bed Mobility Overal bed mobility: Modified Independent                  Transfers Overall transfer level: Modified independent Equipment used: None                Ambulation/Gait Ambulation/Gait assistance: Independent (in homelike environment) Gait Distance (Feet): 300 Feet Assistive device: None Gait Pattern/deviations: Step-through pattern   Gait velocity interpretation: >2.62 ft/sec, indicative of community ambulatory General Gait Details: steady, no deviation with challenge to balance incl abrupt increases in speed, directional changes, scanning, backing up.  Stairs Stairs: Yes Stairs assistance: Modified independent (Device/Increase time) Stair Management: One rail Right;Alternating pattern;Forwards Number of Stairs: 5 General stair comments: safe with the rail  Wheelchair Mobility    Modified Rankin (Stroke Patients Only)       Balance Overall balance assessment: Modified Independent;Independent   Sitting  balance-Leahy Scale: Normal       Standing balance-Leahy Scale: Good                               Pertinent Vitals/Pain Pain Assessment: Faces Faces Pain Scale: No hurt Pain Location:  (HA relieved) Pain Intervention(s): Monitored during session    Home Living Family/patient expects to be discharged to:: Private residence Living Arrangements: Children Available Help at Discharge: Family;Available PRN/intermittently Type of Home: House Home Access: Stairs to enter Entrance Stairs-Rails: Psychiatric nurse of Steps: 4-6 Home Layout: One level Home Equipment: Walker - 4 wheels;Bedside commode;Shower seat;Hand held shower head      Prior Function Level of Independence: Needs assistance   Gait / Transfers Assistance Needed: Independent without AD. Reports 0 falls in last 6 months.  ADL's / Homemaking Assistance Needed: Independent with ADLs. Assist for homemaking and housekeeping        Hand Dominance   Dominant Hand: Right    Extremity/Trunk Assessment   Upper Extremity Assessment Upper Extremity Assessment: Overall WFL for tasks assessed;Defer to OT evaluation    Lower Extremity Assessment Lower Extremity Assessment: Overall WFL for tasks assessed    Cervical / Trunk Assessment Cervical / Trunk Assessment: Kyphotic  Communication   Communication: No difficulties  Cognition Arousal/Alertness: Awake/alert Behavior During Therapy: WFL for tasks assessed/performed Overall Cognitive Status: Within Functional Limits for tasks assessed  General Comments General comments (skin integrity, edema, etc.): SpO2 95%  HR after moderate exertion 93 bpm    Exercises     Assessment/Plan    PT Assessment Patent does not need any further PT services  PT Problem List         PT Treatment Interventions      PT Goals (Current goals can be found in the Care Plan section)  Acute Rehab PT  Goals Patient Stated Goal: To return home. PT Goal Formulation: All assessment and education complete, DC therapy    Frequency     Barriers to discharge        Co-evaluation               AM-PAC PT "6 Clicks" Mobility  Outcome Measure Help needed turning from your back to your side while in a flat bed without using bedrails?: None Help needed moving from lying on your back to sitting on the side of a flat bed without using bedrails?: None Help needed moving to and from a bed to a chair (including a wheelchair)?: None Help needed standing up from a chair using your arms (e.g., wheelchair or bedside chair)?: None Help needed to walk in hospital room?: None Help needed climbing 3-5 steps with a railing? : None 6 Click Score: 24    End of Session   Activity Tolerance: Patient tolerated treatment well Patient left: in bed;with call bell/phone within reach;Other (comment) (sitting EOB) Nurse Communication: Mobility status PT Visit Diagnosis: Other symptoms and signs involving the nervous system (H54.562)    Time: 5638-9373 PT Time Calculation (min) (ACUTE ONLY): 12 min   Charges:   PT Evaluation $PT Eval Low Complexity: 1 Low          11/29/2020  Ginger Carne., PT Acute Rehabilitation Services [phone]  (pager) [phone]  (office)  Tessie Fass Dennice Tindol 11/29/2020, 4:11 PM

## 2020-11-29 NOTE — Consult Note (Signed)
Neurology Consultation  Reason for Consult: CT head with small lacunar infarct of the right thalamus, age indeterminate.  Referring Physician: Dr. Bonner Puna  CC: Right-sided neck pain, headache, right lower extremity weakness, drooling  History is obtained from: Patient, chart review  HPI: ASHA [name] is a 80 y.o. female with a medical history significant for coronary artery disease, congestive heart failure, hypertension, paroxysmal atrial fibrillation compliant with Eliquis therapy, CVA, and pulmonary emboli in 2008 and 2021 who presented to the ED on 5/29 for evaluation of right neck pain, headache, drooling from her mouth bilaterally, and right lower extremity weakness. She states that approximately 3 days prior to hospital arrival, she began to experience neck pain in the right neck as well as a headache that involved her posterior and superior head described as a throbbing / numbness feeling with associated intermittent dizziness at home. She also endorses right lower extremity weakness and states "I would take a step with my right leg and wouldn't be sure that it was safe". She states that she felt that she was having a stroke because she began to drool out of both sides of her mouth the way that she did when she had her first stroke but did not go to the ED until she had a stabbing right-sided neck pain for 3 days that was aggravated by bending forward. She denies any injury to her neck or recent falls.   In the ED, a CT head was obtained revealing a small lacunar infarct in the right thalamus new from her previous imaging in September 2020 but otherwise is age indeterminate.   Of note, patient was previously on a dysphagia diet with her previous stroke but she states that she gradually built up a tolerance for thin liquids at home. She states that she has been drinking thin liquids but has not been cleared by her physician for thin liquids. She does state that she frequently chokes when taking  her medication with thin liquids.   On later attending evaluation she reports that her neck pain has improved, she does not feel weak in her right leg anymore and she feels at baseline and eager to go home     ROS: A complete ROS was performed and is negative except as noted in the HPI.  Past Medical History:  Diagnosis Date  . Anemia 2005   Generally microcytic, transfusions in [address], 2012, 02/2015, 05/2015  . Aortic stenosis    Moderate November 2017  . Arthritis   . Broken back 2013   Chronic back pain.   [name] CAD (coronary artery disease)    Cardiac catheterization 2014 - 80% mid RCA and 70% OM managed medically  . CHF (congestive heart failure) ([place])   . Chronic bilateral pleural effusions   . Chronic headache   . Contrast media allergy   . Diastolic heart failure ([place])   . Diverticulosis   . Esophageal cancer ([place]) 05/06/2015   Adenocarcinoma GE junction  . Facial paresthesia   . GERD (gastroesophageal reflux disease)   . H. pylori infection   . H/O iron deficiency    05-06-15 iron infusion ([facility])  . HH (hiatus hernia) 2008   Large with associated erosions  . Hypertension   . Paroxysmal atrial fibrillation ([name])   . PONV (postoperative nausea and vomiting)   . Pulmonary emboli ([place]) 2008, 2012  . Stroke ([place]) 1982  . Swallowing dysfunction    Past Surgical History:  Procedure Laterality Date  . APPENDECTOMY  1950s  .  CARDIOVERSION N/A 07/08/2019   Procedure: CARDIOVERSION;  Surgeon: Pixie Casino, MD;  Location: [facility];  Service: Cardiovascular;  Laterality: N/A;  . CARPAL TUNNEL RELEASE Bilateral 1990s  . [place] SURGERY  2014  . CHOLECYSTECTOMY  1980s   open  . COLONOSCOPY N/A 02/17/2015   Procedure: COLONOSCOPY;  Surgeon: [name], MD;  Location: [facility];  Service: Endoscopy;  Laterality: N/A;  . ESOPHAGOGASTRODUODENOSCOPY N/A 02/16/2015   Procedure: ESOPHAGOGASTRODUODENOSCOPY (EGD);  Surgeon: [name], MD;  Location: [facility];   Service: Endoscopy;  Laterality: N/A;  . ESOPHAGOGASTRODUODENOSCOPY N/A 05/06/2015   Procedure: ESOPHAGOGASTRODUODENOSCOPY (EGD);  Surgeon: [name], MD;  Location: [facility] ENDOSCOPY;  Service: Endoscopy;  Laterality: N/A;  . EUS N/A 05/20/2015   Procedure: UPPER ENDOSCOPIC ULTRASOUND (EUS) LINEAR;  Surgeon: [name], MD;  Location: [facility] ENDOSCOPY;  Service: Endoscopy;  Laterality: N/A;  . exploratory lab  1950s or 1960s  . GIVENS CAPSULE STUDY N/A 05/06/2015   Procedure: GIVENS CAPSULE STUDY;  Surgeon: [name], MD;  Location: [facility] ENDOSCOPY;  Service: Endoscopy;  Laterality: N/A;  . KNEE ARTHROSCOPY WITH MEDIAL MENISECTOMY Left 04/18/2018   Procedure: LEFT KNEE ARTHROSCOPY WITH PARTIAL MEDIAL MENISECTOMY AND ANTERIOR CRUCIATE LIGAMENT DEBRIDEMENT;  Surgeon: [name], MD;  Location: [facility];  Service: Orthopedics;  Laterality: Left;  . LEFT HEART CATH AND CORONARY ANGIOGRAPHY N/A 11/08/2016   Procedure: Left Heart Cath and Coronary Angiography;  Surgeon: [name], MD;  Location: [facility];  Service: Cardiovascular;  Laterality: N/A;  . LEFT HEART CATH AND CORONARY ANGIOGRAPHY N/A 08/19/2019   Procedure: LEFT HEART CATH AND CORONARY ANGIOGRAPHY;  Surgeon: Martinique, Peter M, MD;  Location: [facility];  Service: Cardiovascular;  Laterality: N/A;  . lumbar back surgery  2012  . [place] SURGERY  1991  . TONSILLECTOMY AND ADENOIDECTOMY  1960s   Family History  Problem Relation Age of Onset  . Stroke Mother   . Heart disease Mother   . Emphysema Father   . Liver disease Sister   . Ovarian cancer Sister   . Stroke Sister   . Other Child        died at birth  . Colon cancer Neg Hx   . Esophageal cancer Neg Hx   . Stomach cancer Neg Hx   . Pancreatic cancer Neg Hx    Social History:   reports that she has never smoked. She has never used smokeless tobacco. She reports that she does not drink alcohol and does not use drugs.  Medications  Current  Facility-Administered Medications:  .  acetaminophen (TYLENOL) tablet 650 mg, 650 mg, Oral, Q4H PRN, 650 mg at 11/29/20 0630 **OR** acetaminophen (TYLENOL) 160 MG/5ML solution 650 mg, 650 mg, Per Tube, Q4H PRN **OR** acetaminophen (TYLENOL) suppository 650 mg, 650 mg, Rectal, Q4H PRN, Emokpae, Ejiroghene E, MD .  apixaban (ELIQUIS) tablet 5 mg, 5 mg, Oral, BID, Emokpae, Ejiroghene E, MD .  atorvastatin (LIPITOR) tablet 40 mg, 40 mg, Oral, Daily, Emokpae, Ejiroghene E, MD .  buPROPion (WELLBUTRIN XL) 24 hr tablet 150 mg, 150 mg, Oral, Daily, Emokpae, Ejiroghene E, MD .  carvedilol (COREG) tablet 25 mg, 25 mg, Oral, BID WC, Emokpae, Ejiroghene E, MD .  labetalol (NORMODYNE) injection 10 mg, 10 mg, Intravenous, Q2H PRN, Emokpae, Ejiroghene E, MD .  LORazepam (ATIVAN) injection 0.5 mg, 0.5 mg, Intravenous, Once, Emokpae, Ejiroghene E, MD .  pantoprazole (PROTONIX) EC tablet 40 mg, 40 mg, Oral,  BID, Emokpae, Ejiroghene E, MD, 40 mg at 11/28/20 2256 .  senna-docusate (Senokot-S) tablet 1 tablet, 1 tablet, Oral, QHS PRN, Emokpae, Ejiroghene E, MD .  temazepam (RESTORIL) capsule 15 mg, 15 mg, Oral, QHS PRN, Emokpae, Ejiroghene E, MD, 15 mg at 11/28/20 2256 .  traMADol (ULTRAM) tablet 50 mg, 50 mg, Oral, Q8H PRN, Emokpae, Ejiroghene E, MD, 50 mg at 11/28/20 2256  Exam: Current vital signs: BP (!) 113/46 (BP Location: Left Arm) Comment: not. nurse  Pulse 84   Temp 97.7 F (36.5 C) (Oral)   Resp 18   Ht 5\' 2"  (1.575 m)   Wt 44.9 kg   SpO2 98%   BMI 18.10 kg/m  Vital signs in last 24 hours: Temp:  [97.7 F (36.5 C)-98.3 F (36.8 C)] 97.7 F (36.5 C) (05/30 0501) Pulse Rate:  [70-115] 84 (05/30 0501) Resp:  [15-21] 18 (05/30 0501) BP: (108-182)/(46-168) 113/46 (05/30 0501) SpO2:  [94 %-98 %] 98 % (05/30 0501) Weight:  [44.9 kg] 44.9 kg (05/29 2100)  GENERAL: Frail woman sitting up in bed, in no acute distress Psych: Affect appropriate for situation, calm and cooperative with  examination Head: Normocephalic and atraumatic, without obvious abnormality EENT: Normal conjunctivae, no OP obstruction.  LUNGS: Normal respiratory effort on room air, non-labored breathing CV: Regular rate on telemetry, no lower extremity edema ABDOMEN: Soft, nontender Ext: warm, well perfused, without obvious deformity.   NEURO:  Mental Status: Awake, alert, and oriented to self, age, month, year, location, and situation. Patient is able to provide a clear and coherent history of present illness. Speech is mildly dysarthric at baseline from previous stroke and patient states that her speech is consistent with her baseline. She is not aphasic.  Naming, repetition, fluency, and comprehension intact. No neglect is noted Cranial Nerves:  II: PERRL 3 mm/brisk. Visual fields full.  III, IV, VI: EOMI with saccadic pursuits. Lid elevation symmetric and full.  V: Sensation is intact to light touch and symmetrical to face.  VII: Face is symmetric resting and smiling.  VIII: Hearing is intact to voice IX, X: Palate elevation is symmetric. Phonation normal.  XI: Normal sternocleidomastoid and trapezius muscle strength XII: Tongue protrudes midline without fasciculations.   Motor: 4/5 strength throughout with generalized weakness due to frail body habitus.  Bilateral upper extremities able to sustain antigravity movement without vertical drift.  Left upper extremity strength assessment pain limited.  Right lower extremity with subtle weakness > left lower extremity. RLE with minimal vertical drift on assessment.  Left lower extremity able to sustain antigravity movement without vertical drift on assessment.   On later attending evaluation there is no drift of the right lower extremity and there is equal strength in the bilateral lower extremities Sensation: Intact to light touch bilaterally in all four extremities. Coordination: FTN intact bilaterally. HKS intact bilaterally.  DTRs: 3+ and  symmetric patellae, 2+ and symmetric brachioradialis  Gait: deferred  NIHSS: 1a Level of Conscious.: 0 1b LOC Questions: 0 1c LOC Commands: 0 2 Best Gaze: 0 3 Visual: 0 4 Facial Palsy: 0 5a Motor Arm - left: 0 5b Motor Arm - Right: 0 6a Motor Leg - Left: 0 6b Motor Leg - Right: 1 7 Limb Ataxia: 0 8 Sensory: 0 9 Best Language: 0 10 Dysarthria: 1 11 Extinct. and Inatten.: 0 TOTAL: 2  Labs I have reviewed labs in epic and the results pertinent to this consultation are: CBC    Component Value Date/Time   WBC 5.3 11/28/2020  0936   RBC 4.02 11/28/2020 0936   HGB 11.7 (L) 11/28/2020 0936   HGB 12.4 08/14/2019 1101   HGB 13.1 06/04/2017 1017   HCT 34.8 (L) 11/28/2020 0936   HCT 37.8 08/14/2019 1101   HCT 38.6 06/04/2017 1017   PLT 123 (L) 11/28/2020 0936   PLT 154 08/14/2019 1101   MCV 86.6 11/28/2020 0936   MCV 88 08/14/2019 1101   MCV 88.5 06/04/2017 1017   MCH 29.1 11/28/2020 0936   MCHC 33.6 11/28/2020 0936   RDW 12.5 11/28/2020 0936   RDW 13.0 08/14/2019 1101   RDW 12.7 06/04/2017 1017   LYMPHSABS 1.1 11/28/2020 0936   LYMPHSABS 1.8 05/08/2019 1511   LYMPHSABS 1.5 06/04/2017 1017   MONOABS 0.5 11/28/2020 0936   MONOABS 0.4 06/04/2017 1017   EOSABS 0.1 11/28/2020 0936   EOSABS 0.1 05/08/2019 1511   BASOSABS 0.0 11/28/2020 0936   BASOSABS 0.0 05/08/2019 1511   BASOSABS 0.0 06/04/2017 1017    CMP     Component Value Date/Time   NA 134 (L) 11/28/2020 0936   NA 144 08/14/2019 1101   K 3.2 (L) 11/28/2020 0936   CL 96 (L) 11/28/2020 0936   CO2 30 11/28/2020 0936   GLUCOSE 167 (H) 11/28/2020 0936   BUN 16 11/28/2020 0936   BUN 14 08/14/2019 1101   CREATININE 0.91 11/28/2020 0936   CREATININE 0.89 (H) 04/08/2020 1012   CALCIUM 8.4 (L) 11/28/2020 0936   PROT 6.9 11/28/2020 0936   ALBUMIN 3.8 11/28/2020 0936   AST 16 11/28/2020 0936   ALT 16 11/28/2020 0936   ALKPHOS 104 11/28/2020 0936   BILITOT 1.1 11/28/2020 0936   GFRNONAA >60 11/28/2020 0936    GFRNONAA 61 04/08/2020 1012   GFRAA 71 04/08/2020 1012   Lipid Panel     Component Value Date/Time   CHOL 156 11/29/2020 0107   TRIG 66 11/29/2020 0107   HDL 44 11/29/2020 0107   CHOLHDL 3.5 11/29/2020 0107   VLDL 13 11/29/2020 0107   LDLCALC 99 11/29/2020 0107   LDLCALC 99 04/08/2020 1012   Lab Results  Component Value Date   HGBA1C 6.0 (H) 04/08/2020     Imaging I have reviewed the images obtained:  CT Head: Personally reviewed by attending physician 1. Small lacunar infarct in the right thalamus which is new from the priors, but otherwise age indeterminate. Recommend MRI to better evaluate. 2. Evolution of previously seen right corona radiata infarct with similar additional remote lacunar infarcts. 3. Moderate left maxillary and ethmoid air cell mucosal thickening with left maxillary air-fluid level.  CTA head: Personally reviewed by attending physician 1. No large vessel occlusion. 2. Mild to moderate proximal left intradural vertebral artery and left P2 PCA stenosis.  CTA neck: Personally reviewed by attending physician 1. Severe stenosis of the right vertebral artery origin. Mild to moderate right and mild left vertebral artery stenosis at C3-C4. 2. Approximately 50% stenosis of the left subclavian artery origin. 3. Bilateral carotid bifurcation atherosclerosis with approximately 40% stenosis of the proximal left ICA.  MRI brain personally reviewed, no acute intracranial process Formal radiology read: 1. No evidence of acute intracranial abnormality. The small right thalamic lacunar infarct seen on recent CT head is new from the prior MRI, but not acute. 2. Moderate chronic microvascular ischemic disease with infratentorial and supratentorial remote infarcts. 3. Moderate left anterior ethmoid and maxillary sinus mucosal thickening.  Assessment: 80 year old female with medical history as above who presents with c/o right neck pain,  headache, and RLE weakness with a  transient episode of bilateral mouth drooling at home.  - Stroke risk factors include age, paroxysmal atrial fibrillation, history of stroke, hypertension, CAD, and multiple PEs  - Examination reveals frail woman who is alert and oriented with residual mild dysarthria and aphasia from previous stroke with some residual headache and neck pain. She has generalized weakness with subtle right > left lower extremity weakness.  - CT head with age indeterminate lacunar infarct of the right thalamus with multiple remote lacunar infarcts and remote right corona radiata infarct. MRI brain pending for further evaluation.  From secondary stroke prevention perspective, there is room for medical optimization by increasing her statin therapy which she is already appropriately treated with Eliquis and there is no indication for adding antiplatelet to anticoagulation.  Impression: Right thalamic lacunar infarct- age indeterminate Remote lacunar infarcts with residual dysarthria and dysphagia  Recommendations: - LDL 99 on atorvastatin 40 mg PO daily; intensify statin therapy for LDL goal of < 70 - MRI brain completed, negative for acute intracranial process - Stroke labs: A1c pending, can be followed up by PCP - Vitamin B12 level pending, can be followed up by PCP - Frequent neuro checks - Prophylactic therapy: Continue home AC, Eliquis - Blood pressure parameters goal normotension, no need for permissive hypertension with symptom onset 3 days ago - Risk factor modification - Telemetry monitoring - Consider SLP evaluation, however given that the patient is at her neurological baseline at this time this could be completed on an outpatient basis - Neurology will be available on an as-needed basis going forward, no further inpatient neurological work-up is needed. - Outpatient neurology follow-up   Anibal Henderson, AGAC-NP [facility] Pager: [phone]  Attending Neurologist's note:  I  personally saw this patient, gathering history, performing a full neurologic examination, reviewing relevant labs, personally reviewing relevant imaging as above, and formulated the assessment and plan, adding the note above for completeness and clarity to accurately reflect my thoughts   Lesleigh Noe MD-PhD [facility] [phone]

## 2020-11-29 NOTE — Evaluation (Signed)
Occupational Therapy Evaluation Patient Details Name: [name] MRN: [id number] DOB: [birth year] Today's Date: 11/29/2020    History of Present Illness 80 y.o. female presenting to ED on 5/29 with acute on chronic dizziness, weakness, headache and neck pain x3 days. CXR (+) pleural effusion, atelectasis/consolidation. EKG (+) A-fib. Head CT (+) age indeterminate small lacunar infarct in R thalamus. 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   Clinical Impression   PTA patient was living with her son in a private residence with 4-6 STE and bilateral handrails and was grossly I with ADLs without AD. Patient's son works during the day. Patient reports requiring some assistance with heavy IADLs. Patient no longer drives. Patient currently functioning near baseline demonstrating observed ADLs with Mod I. Patient reports pain in L wrist and decreased Schlusser during standing grooming tasks at sink level. OT will continue to follow acutely to educate patient on cervical neck and LUE HEP to maximize safety and independence and decrease pain in prep for ADLs/IADLs.     Follow Up Recommendations  No OT follow up    Equipment Recommendations  None recommended by OT    Recommendations for Other Services       Precautions / Restrictions Precautions Precautions: Fall Restrictions Weight Bearing Restrictions: No      Mobility Bed Mobility Overal bed mobility: Modified Independent             General bed mobility comments: Completes all parts of bed mobility with Mod I and slightly increased time.    Transfers Overall transfer level: Modified independent Equipment used: None             General transfer comment: Sit to stand x several trials from various surfaces wtih Mod I. No AD.    Balance Overall balance assessment: No apparent balance deficits (not formally assessed)                                          ADL either performed or assessed with clinical judgement   ADL Overall ADL's : Modified independent                                       General ADL Comments: Demonstrates bed mobility, ADL transfers, toileting/hygiene/clothing management and grooming standing at sink level with Mod I. No AD.     Vision Baseline Vision/History: Wears glasses Wears Glasses: Reading only Patient Visual Report: No change from baseline Vision Assessment?: No apparent visual deficits     Perception     Praxis      Pertinent Vitals/Pain Pain Assessment: 0-10 Pain Score: 4  Pain Location: Headache Pain Descriptors / Indicators: Aching;Constant Pain Intervention(s): Limited activity within patient's tolerance;Monitored during session;Repositioned     Hand Dominance Right   Extremity/Trunk Assessment Upper Extremity Assessment Upper Extremity Assessment: LUE deficits/detail LUE Deficits / Details: Limited ROM after previous fracture with CRPS. LUE Sensation: WNL LUE Coordination: decreased fine motor   Lower Extremity Assessment Lower Extremity Assessment: Defer to PT evaluation   Cervical / Trunk Assessment Cervical / Trunk Assessment: Kyphotic   Communication Communication Communication: No difficulties   Cognition Arousal/Alertness: Awake/alert Behavior During Therapy: WFL for tasks assessed/performed Overall Cognitive Status: Within Functional Limits  for tasks assessed                                     General Comments  VSS on RA.    Exercises     Shoulder Instructions      Home Living Family/patient expects to be discharged to:: Private residence Living Arrangements: Children Available Help at Discharge: Family;Available PRN/intermittently Type of Home: House Home Access: Stairs to enter [employer] of Steps: 4-6 Entrance Stairs-Rails: Right;Left Home Layout: One level     Bathroom  Shower/Tub: Teacher, early years/pre: Standard (BSC over top)     Home Equipment: Walker - 4 wheels;Bedside commode;Shower seat;Hand held shower head          Prior Functioning/Environment Level of Independence: Needs assistance  Gait / Transfers Assistance Needed: Independent without AD. Reports 0 falls in last 6 months. ADL's / Homemaking Assistance Needed: Independent with ADLs. Assist for homemaking and housekeeping            OT Problem List: Decreased strength;Decreased range of motion;Impaired UE functional use      OT Treatment/Interventions: Self-care/ADL training;Therapeutic exercise;Energy conservation;DME and/or AE instruction;Therapeutic activities;Patient/family education;Balance training    OT Goals(Current goals can be found in the care plan section) Acute Rehab OT Goals Patient Stated Goal: To return home. OT Goal Formulation: With patient Time For Goal Achievement: 12/13/20 Potential to Achieve Goals: Good ADL Goals Additional ADL Goal #1: Patient will complete ADLs with Mod I in prep for safe d/c home. Additional ADL Goal #2: Patient will return demo LUE and cervical neck HEP independently with use of written handout.  OT Frequency: Min 2X/week   Barriers to D/C:            Co-evaluation              AM-PAC OT "6 Clicks" Daily Activity     Outcome Measure Help from another person eating meals?: None Help from another person taking care of personal grooming?: None Help from another person toileting, which includes using toliet, bedpan, or urinal?: None Help from another person bathing (including washing, rinsing, drying)?: A Little Help from another person to put on and taking off regular upper body clothing?: None Help from another person to put on and taking off regular lower body clothing?: None 6 Click Score: 23   End of Session Equipment Utilized During Treatment: Gait belt Nurse Communication: Mobility status  Activity  Tolerance: Patient tolerated treatment well Patient left: in chair;with call bell/phone within reach;with chair alarm set  OT Visit Diagnosis: Muscle weakness (generalized) (M62.81)                Time: 8891-6945 OT Time Calculation (min): 21 min Charges:  OT General Charges $OT Visit: 1 Visit OT Evaluation $OT Eval Low Complexity: 1 Low  Toshiko Kemler H. OTR/L Supplemental OT, Department of rehab services [phone]  Valla Pacey R H. 11/29/2020, 9:49 AM

## 2020-11-29 NOTE — Progress Notes (Signed)
SLP Cancellation Note  Patient Details Name: [name] MRN: [id number] DOB: Jan 03, 1941   Cancelled treatment:       Reason Eval/Treat Not Completed: Patient at procedure or test/unavailable   [name], [name] 11/29/2020, 12:12 PM

## 2020-11-29 NOTE — Progress Notes (Signed)
PROGRESS NOTE  [name]  SHF:[id number] DOB: February 04, 1941 DOA: 11/28/2020 PCP: Dorothyann Peng, NP   Brief Narrative: [name] is an 80 y.o. female with a history of PAF on eliquis, stroke, PE, BPPV, HTN, chronic HFpEF who presented to the ED at [facility] 5/29 with several days of right posterior head pain and intermittent dizziness with some right-sided weakness. SBP 180's on arrival, potassium 3.2. CXR showed pleural effusion, atelectasis/consolidation. ECG showed atrial fibrillation.  Head CT showed age-indeterminate small lacunar infarct in right thalamus which is new from prior. The patient was admitted to [facility] for MRI and neurology evaluation.  Assessment & Plan: Principal Problem:   Vertigo Active Problems:   Long term current use of anticoagulant therapy   Diastolic heart failure ([name])   Uncontrolled hypertension   Benign paroxysmal positional vertigo   History of pulmonary embolism   Persistent atrial fibrillation ([name])  Age-indeterminate lacunar CVA with right sided-weakness: Symptoms I don't believe would be strictly mapped onto area of neuroimaging abnormality.  - NEurology consulted this morning - MRI ordered and pending - Echocardiogram ordered and pending.  - LDL 99, continue statin, may augment to lipitor 80 vs. crestor.  - Continue anticoagulation.  - PT, OT, SLP pending.   Chronic HFpEF, HTN: Euvolemic.  - Restart home coreg, lasix. Holding norvasc to avoid overly rapid decline in BP.   Persistent atrial fibrillation:  - Continue coreg, eliquis.  History of PE:  - Continue anticoagulation as above   CRPS s/p left wrist fracture: Managed nonoperatively due to age/comorbidities.  - Continue outpatient management, home pain medications  DVT prophylaxis: Eliquis Code Status: DNR Family Communication: None at bedside Disposition Plan:  Status is: Observation  The patient will require care spanning > 2 midnights and should be moved to inpatient because:  Ongoing diagnostic testing needed not appropriate for outpatient work up  Dispo: The patient is from: Home              Anticipated d/c is to: Home              Patient currently is not medically stable to d/c.   Difficult to place patient No  Consultants:   Neurology  Procedures:   None  Antimicrobials:  None   Subjective: WAnts to get up, states her posterior headache is gone though around the top of her head still feels abnormal. Not numb, just weird. No other numbness, weakness anywhere.   Objective: Vitals:   11/28/20 1930 11/28/20 2100 11/29/20 0501 11/29/20 0854  BP: (!) 114/53 (!) 108/96 (!) 113/46 132/84  Pulse: (!) 111 85 84 61  Resp: 17 20 18 20   Temp:  97.7 F (36.5 C) 97.7 F (36.5 C) 98.3 F (36.8 C)  TempSrc:  Oral Oral Oral  SpO2: 96% 96% 98% 98%  Weight:  44.9 kg    Height:  5\' 2"  (1.575 m)     No intake or output data in the 24 hours ending 11/29/20 1130 Filed Weights   11/28/20 0832 11/28/20 2100  Weight: 44.9 kg 44.9 kg   Gen: Thin elderly female in no distress Pulm: Non-labored breathing room air. Clear to auscultation bilaterally.  CV: Regular rate and rhythm. No murmur, rub, or gallop. No JVD, no pedal edema. GI: Abdomen soft, non-tender, non-distended, with normoactive bowel sounds. No organomegaly or masses felt. Ext: Warm, no deformities Skin: No rashes, lesions or ulcers Neuro: Alert and oriented. No focal neurological deficits. Psych: Judgement and insight appear normal. Mood &  affect appropriate.   Data Reviewed: I have personally reviewed following labs and imaging studies  CBC: Recent Labs  Lab 11/28/20 0936  WBC 5.3  NEUTROABS 3.6  HGB 11.7*  HCT 34.8*  MCV 86.6  PLT 937*   Basic Metabolic Panel: Recent Labs  Lab 11/28/20 0936 11/28/20 1128  NA 134*  --   K 3.2*  --   CL 96*  --   CO2 30  --   GLUCOSE 167*  --   BUN 16  --   CREATININE 0.91  --   CALCIUM 8.4*  --   MG  --  2.0   GFR: Estimated Creatinine  Clearance: 34.4 mL/min (by C-G formula based on SCr of 0.91 mg/dL). Liver Function Tests: Recent Labs  Lab 11/28/20 0936  AST 16  ALT 16  ALKPHOS 104  BILITOT 1.1  PROT 6.9  ALBUMIN 3.8   No results for input(s): LIPASE, AMYLASE in the last 168 hours. No results for input(s): AMMONIA in the last 168 hours. Coagulation Profile: No results for input(s): INR, PROTIME in the last 168 hours. Cardiac Enzymes: No results for input(s): CKTOTAL, CKMB, CKMBINDEX, TROPONINI in the last 168 hours. BNP (last 3 results) No results for input(s): PROBNP in the last 8760 hours. HbA1C: No results for input(s): HGBA1C in the last 72 hours. CBG: No results for input(s): GLUCAP in the last 168 hours. Lipid Profile: Recent Labs    11/29/20 0107  CHOL 156  HDL 44  LDLCALC 99  TRIG 66  CHOLHDL 3.5   Thyroid Function Tests: No results for input(s): TSH, T4TOTAL, FREET4, T3FREE, THYROIDAB in the last 72 hours. Anemia Panel: No results for input(s): VITAMINB12, FOLATE, FERRITIN, TIBC, IRON, RETICCTPCT in the last 72 hours. Urine analysis:    Component Value Date/Time   COLORURINE YELLOW 03/06/2019 2326   APPEARANCEUR CLEAR 03/06/2019 2326   LABSPEC 1.009 03/06/2019 2326   PHURINE 8.0 03/06/2019 2326   GLUCOSEU NEGATIVE 03/06/2019 2326   HGBUR MODERATE (A) 03/06/2019 2326   BILIRUBINUR NEGATIVE 03/06/2019 2326   BILIRUBINUR n 01/29/2018 1029   KETONESUR NEGATIVE 03/06/2019 2326   PROTEINUR NEGATIVE 03/06/2019 2326   UROBILINOGEN 0.2 01/29/2018 1029   UROBILINOGEN 0.2 05/04/2015 2308   NITRITE NEGATIVE 03/06/2019 2326   LEUKOCYTESUR NEGATIVE 03/06/2019 2326   Recent Results (from the past 240 hour(s))  Resp Panel by RT-PCR (Flu A&B, Covid) Nasopharyngeal Swab     Status: None   Collection Time: 11/28/20  3:52 PM   Specimen: Nasopharyngeal Swab; Nasopharyngeal(NP) swabs in vial transport medium  Result Value Ref Range Status   SARS Coronavirus 2 by RT PCR NEGATIVE NEGATIVE Final     Comment: (NOTE) SARS-CoV-2 target nucleic acids are NOT DETECTED.  The SARS-CoV-2 RNA is generally detectable in upper respiratory specimens during the acute phase of infection. The lowest concentration of SARS-CoV-2 viral copies this assay can detect is 138 copies/mL. A negative result does not preclude SARS-Cov-2 infection and should not be used as the sole basis for treatment or other patient management decisions. A negative result may occur with  improper specimen collection/handling, submission of specimen other than nasopharyngeal swab, presence of viral mutation(s) within the areas targeted by this assay, and inadequate number of viral copies(<138 copies/mL). A negative result must be combined with clinical observations, patient history, and epidemiological information. The expected result is Negative.  Fact Sheet for Patients:  EntrepreneurPulse.com.au  Fact Sheet for Healthcare Providers:  IncredibleEmployment.be  This test is no t yet approved or  cleared by the [place] and  has been authorized for detection and/or diagnosis of SARS-CoV-2 by FDA under an Emergency Use Authorization (EUA). This EUA will remain  in effect (meaning this test can be used) for the duration of the COVID-19 declaration under Section 564(b)(1) of the Act, 21 U.S.C.section 360bbb-3(b)(1), unless the authorization is terminated  or revoked sooner.       Influenza A by PCR NEGATIVE NEGATIVE Final   Influenza B by PCR NEGATIVE NEGATIVE Final    Comment: (NOTE) The Xpert Xpress SARS-CoV-2/FLU/RSV plus assay is intended as an aid in the diagnosis of influenza from Nasopharyngeal swab specimens and should not be used as a sole basis for treatment. Nasal washings and aspirates are unacceptable for Xpert Xpress SARS-CoV-2/FLU/RSV testing.  Fact Sheet for Patients: EntrepreneurPulse.com.au  Fact Sheet for Healthcare  Providers: IncredibleEmployment.be  This test is not yet approved or cleared by the [place] FDA and has been authorized for detection and/or diagnosis of SARS-CoV-2 by FDA under an Emergency Use Authorization (EUA). This EUA will remain in effect (meaning this test can be used) for the duration of the COVID-19 declaration under Section 564(b)(1) of the Act, 21 U.S.C. section 360bbb-3(b)(1), unless the authorization is terminated or revoked.  Performed at [facility], [address] [place] [address], [place], [place] [address]       Radiology Studies: CT Angio Head W or Wo Contrast  Result Date: 11/28/2020 CLINICAL DATA:  Vertigo. EXAM: CT ANGIOGRAPHY HEAD AND NECK TECHNIQUE: Multidetector CT imaging of the head and neck was performed using the standard protocol during bolus administration of intravenous contrast. Multiplanar CT image reconstructions and MIPs were obtained to evaluate the vascular anatomy. Carotid stenosis measurements (when applicable) are obtained utilizing NASCET criteria, using the distal internal carotid diameter as the denominator. CONTRAST:  30mL OMNIPAQUE IOHEXOL 350 MG/ML SOLN COMPARISON:  CT head March 09, 2019. MRI March 07, 2019. FINDINGS: CT HEAD FINDINGS Brain: Small lacunar infarct in the right thalamus (series 4, image 16 which is new from the priors, but otherwise age indeterminate. Evolution of the previously seen right corona radiata infarct with encephalomalacia. Similar additional remote right cerebellar and right corona radiata infarcts infarct. No acute hemorrhage. No hydrocephalus, mass lesion, abnormal mass effect, or sizable extra-axial fluid collection. Vascular: See below. Skull: No acute fracture Sinuses: Moderate left maxillary and ethmoid air cell mucosal thickening with left maxillary air-fluid level. Orbits: No acute abnormality. Review of the MIP images confirms the above findings CTA NECK FINDINGS Aortic arch: Great vessel origins are  patent. Approximately 50% stenosis of the left subclavian artery origin due to calcific atherosclerosis. Right carotid system: Mixed calcific and noncalcific atherosclerosis at the carotid bifurcation involving the proximal internal carotid artery without greater than 50% stenosis. Left carotid system: Mixed calcific and noncalcific atherosclerosis with approximately 40% stenosis of the proximal ICA. Vertebral arteries: Both vertebral arteries arise from the subclavian arteries. Severe stenosis of the right vertebral artery origin. Mild-to-moderate stenosis of the right vertebral artery at approximately C3-C4. Mild stenosis of the left vertebral artery at C3-C4. Skeleton: Similar postoperative changes of anterior screw and plate fixation and intervertebral fusion at C3-C4 and C5-C6. Similar alignment. Other neck: No acute abnormality. Upper chest: Biapical pleuroparenchymal scarring. Otherwise, lung apices are clear. Review of the MIP images confirms the above findings CTA HEAD FINDINGS Anterior circulation: Predominately calcific atherosclerosis of bilateral cavernous and paraclinoid ICAs without greater than 50% stenosis. Patent MCAs and ACAs without proximal hemodynamically significant stenosis. Early right MCA bifurcation.  No aneurysm. Posterior circulation: Mild stenosis of the proximal dominant left intradural vertebral artery. Patent non dominant right intradural vertebral artery. Patent basilar artery and bilateral posterior cerebral arteries. Mild-to-moderate stenosis of the proximal left P2 PCA Venous sinuses: As permitted by contrast timing, patent. Small left transverse sinus. Arachnoid granulations in the superior sagittal sinus. Review of the MIP images confirms the above findings IMPRESSION: CT Head: 1. Small lacunar infarct in the right thalamus which is new from the priors, but otherwise age indeterminate. Recommend MRI to better evaluate. 2. Evolution of previously seen right corona radiata infarct  with similar additional remote lacunar infarcts. 3. Moderate left maxillary and ethmoid air cell mucosal thickening with left maxillary air-fluid level. CTA head: 1. No large vessel occlusion. 2. Mild to moderate proximal left intradural vertebral artery and left P2 PCA stenosis. CTA neck: 1. Severe stenosis of the right vertebral artery origin. Mild to moderate right and mild left vertebral artery stenosis at C3-C4. 2. Approximately 50% stenosis of the left subclavian artery origin. 3. Bilateral carotid bifurcation atherosclerosis with approximately 40% stenosis of the proximal left ICA. Findings discussed with Dr. Sabra Heck via telephone at 3:44 PM. Electronically Signed   By: Margaretha Sheffield MD   On: 11/28/2020 15:45   CT Angio Neck W and/or Wo Contrast  Result Date: 11/28/2020 CLINICAL DATA:  Vertigo. EXAM: CT ANGIOGRAPHY HEAD AND NECK TECHNIQUE: Multidetector CT imaging of the head and neck was performed using the standard protocol during bolus administration of intravenous contrast. Multiplanar CT image reconstructions and MIPs were obtained to evaluate the vascular anatomy. Carotid stenosis measurements (when applicable) are obtained utilizing NASCET criteria, using the distal internal carotid diameter as the denominator. CONTRAST:  25mL OMNIPAQUE IOHEXOL 350 MG/ML SOLN COMPARISON:  CT head March 09, 2019. MRI March 07, 2019. FINDINGS: CT HEAD FINDINGS Brain: Small lacunar infarct in the right thalamus (series 4, image 16 which is new from the priors, but otherwise age indeterminate. Evolution of the previously seen right corona radiata infarct with encephalomalacia. Similar additional remote right cerebellar and right corona radiata infarcts infarct. No acute hemorrhage. No hydrocephalus, mass lesion, abnormal mass effect, or sizable extra-axial fluid collection. Vascular: See below. Skull: No acute fracture Sinuses: Moderate left maxillary and ethmoid air cell mucosal thickening with left maxillary  air-fluid level. Orbits: No acute abnormality. Review of the MIP images confirms the above findings CTA NECK FINDINGS Aortic arch: Great vessel origins are patent. Approximately 50% stenosis of the left subclavian artery origin due to calcific atherosclerosis. Right carotid system: Mixed calcific and noncalcific atherosclerosis at the carotid bifurcation involving the proximal internal carotid artery without greater than 50% stenosis. Left carotid system: Mixed calcific and noncalcific atherosclerosis with approximately 40% stenosis of the proximal ICA. Vertebral arteries: Both vertebral arteries arise from the subclavian arteries. Severe stenosis of the right vertebral artery origin. Mild-to-moderate stenosis of the right vertebral artery at approximately C3-C4. Mild stenosis of the left vertebral artery at C3-C4. Skeleton: Similar postoperative changes of anterior screw and plate fixation and intervertebral fusion at C3-C4 and C5-C6. Similar alignment. Other neck: No acute abnormality. Upper chest: Biapical pleuroparenchymal scarring. Otherwise, lung apices are clear. Review of the MIP images confirms the above findings CTA HEAD FINDINGS Anterior circulation: Predominately calcific atherosclerosis of bilateral cavernous and paraclinoid ICAs without greater than 50% stenosis. Patent MCAs and ACAs without proximal hemodynamically significant stenosis. Early right MCA bifurcation. No aneurysm. Posterior circulation: Mild stenosis of the proximal dominant left intradural vertebral artery. Patent non dominant right  intradural vertebral artery. Patent basilar artery and bilateral posterior cerebral arteries. Mild-to-moderate stenosis of the proximal left P2 PCA Venous sinuses: As permitted by contrast timing, patent. Small left transverse sinus. Arachnoid granulations in the superior sagittal sinus. Review of the MIP images confirms the above findings IMPRESSION: CT Head: 1. Small lacunar infarct in the right thalamus  which is new from the priors, but otherwise age indeterminate. Recommend MRI to better evaluate. 2. Evolution of previously seen right corona radiata infarct with similar additional remote lacunar infarcts. 3. Moderate left maxillary and ethmoid air cell mucosal thickening with left maxillary air-fluid level. CTA head: 1. No large vessel occlusion. 2. Mild to moderate proximal left intradural vertebral artery and left P2 PCA stenosis. CTA neck: 1. Severe stenosis of the right vertebral artery origin. Mild to moderate right and mild left vertebral artery stenosis at C3-C4. 2. Approximately 50% stenosis of the left subclavian artery origin. 3. Bilateral carotid bifurcation atherosclerosis with approximately 40% stenosis of the proximal left ICA. Findings discussed with Dr. Sabra Heck via telephone at 3:44 PM. Electronically Signed   By: Margaretha Sheffield MD   On: 11/28/2020 15:45   DG Chest Portable 1 View  Result Date: 11/28/2020 CLINICAL DATA:  Chest pain EXAM: PORTABLE CHEST 1 VIEW COMPARISON:  03/15/2018 FINDINGS: Small right pleural effusion and associated atelectasis or consolidation. The left lung is normally aerated. Cardiomegaly. IMPRESSION: 1. Small right pleural effusion and associated atelectasis or consolidation. The left lung is normally aerated. 2.  Cardiomegaly. Electronically Signed   By: Eddie Candle M.D.   On: 11/28/2020 09:12    Scheduled Meds: . apixaban  5 mg Oral BID  . atorvastatin  40 mg Oral Daily  . buPROPion  150 mg Oral Daily  . carvedilol  25 mg Oral BID WC  . LORazepam  0.5 mg Intravenous Once  . pantoprazole  40 mg Oral BID   Continuous Infusions:   LOS: 0 days   Time spent: 25 minutes.  Patrecia Pour, MD [facility] [email or web address] 11/29/2020, 11:30 AM

## 2020-11-30 LAB — HEMOGLOBIN A1C
Hgb A1c MFr Bld: 6.2 % — ABNORMAL HIGH (ref 4.8–5.6)
Mean Plasma Glucose: 131 mg/dL

## 2020-12-13 ENCOUNTER — Inpatient Hospital Stay: Payer: Medicare Other | Admitting: Family Medicine

## 2020-12-14 ENCOUNTER — Other Ambulatory Visit: Payer: Self-pay

## 2020-12-15 ENCOUNTER — Encounter: Payer: Self-pay | Admitting: Family Medicine

## 2020-12-15 ENCOUNTER — Ambulatory Visit (INDEPENDENT_AMBULATORY_CARE_PROVIDER_SITE_OTHER)
Admission: RE | Admit: 2020-12-15 | Discharge: 2020-12-15 | Disposition: A | Discharge status: Home / Self Care | Payer: Medicare Other | Source: Ambulatory Visit | Attending: Family Medicine | Admitting: Family Medicine

## 2020-12-15 ENCOUNTER — Ambulatory Visit (INDEPENDENT_AMBULATORY_CARE_PROVIDER_SITE_OTHER): Payer: Medicare Other | Admitting: Family Medicine

## 2020-12-15 VITALS — BP 124/82 | HR 60 | Temp 98.3°F | Wt 101.6 lb

## 2020-12-15 DIAGNOSIS — E538 Deficiency of other specified B group vitamins: Secondary | ICD-10-CM

## 2020-12-15 DIAGNOSIS — H8110 Benign paroxysmal vertigo, unspecified ear: Secondary | ICD-10-CM | POA: Diagnosis not present

## 2020-12-15 DIAGNOSIS — M542 Cervicalgia: Secondary | ICD-10-CM

## 2020-12-15 DIAGNOSIS — I4819 Other persistent atrial fibrillation: Secondary | ICD-10-CM

## 2020-12-15 DIAGNOSIS — I5032 Chronic diastolic (congestive) heart failure: Secondary | ICD-10-CM

## 2020-12-15 DIAGNOSIS — I10 Essential (primary) hypertension: Secondary | ICD-10-CM

## 2020-12-15 DIAGNOSIS — I2510 Atherosclerotic heart disease of native coronary artery without angina pectoris: Secondary | ICD-10-CM

## 2020-12-15 DIAGNOSIS — G8929 Other chronic pain: Secondary | ICD-10-CM

## 2020-12-15 DIAGNOSIS — Z8673 Personal history of transient ischemic attack (TIA), and cerebral infarction without residual deficits: Secondary | ICD-10-CM

## 2020-12-15 MED ORDER — CYANOCOBALAMIN 1000 MCG/ML IJ SOLN
1000.0000 ug | Freq: Once | INTRAMUSCULAR | Status: AC
Start: 2020-12-15 — End: 2020-12-15
  Administered 2020-12-15: 1000 ug via INTRAMUSCULAR

## 2020-12-15 NOTE — Progress Notes (Signed)
   Subjective:    Patient ID: [name], female    DOB: 05-Oct-1940, 80 y.o.   MRN: [id number]  HPI Here to follow up on a hospital stay from 11-28-20 to 11-29-20 for complaints of right sided posterior head and neck pain, dizziness, and right arm weakness. A non-contrasted head CT showed an old infarct and also a right thalamic lacunar infarct that was not acute but which appeared to be new since a scan taken in September 2020. A brain MRI showed the same thing. She also had severe stenosis in the right vertebral artery and mild stenosis in the left vertebral artery. She had mild to moderate stenoses in the carotids. It was felt that her symptoms were "stroke like", but she was not diagnosed with a TIA as such. Her labs were unremarkable. She was sent home on her usual medications, but the Lipitor was increased to 80 mg a day. Since going back home, the dizziness and weakness have completely resolved. She still has a lot of pain in the neck area. Of note she is S/P a cervical fusion surgery in 2014. She cannot take NDSAID's sincce she is on Eliquis. She has Tramadol and Tylenol for pain at home, but she tries to avoid taking anything for pain if she can help it.    Review of Systems  Constitutional: Negative.   Respiratory: Negative.    Cardiovascular: Negative.   Gastrointestinal: Negative.   Genitourinary: Negative.   Musculoskeletal:  Positive for neck pain.  Neurological: Negative.       Objective:   Physical Exam Constitutional:      Comments: Frail, walks unassisted   Cardiovascular:     Rate and Rhythm: Normal rate and regular rhythm.     Pulses: Normal pulses.     Heart sounds: Normal heart sounds.  Pulmonary:     Effort: Pulmonary effort is normal.     Breath sounds: Normal breath sounds.  Musculoskeletal:     Comments: She is very tender throughout the entire posterior neck, and her ROM is very limited by pain  Neurological:     General: No focal deficit present.      Mental Status: She is alert and oriented to person, place, and time.     Motor: No weakness.     Gait: Gait normal.          Assessment & Plan:  She has recovered from the dizziness and weakness, and it is unclear of these were of neurological origin or not. She clearly has severe neck pain, and we will send her for cervical spine Xrays today to check on her hardware, etc. She can use Tylenol or Tramadol for pain. We spent 35 minutes reviewing her records and discussing these issues today. Alysia Penna, MD

## 2020-12-19 ENCOUNTER — Other Ambulatory Visit: Payer: Self-pay | Admitting: Adult Health

## 2020-12-19 DIAGNOSIS — Z760 Encounter for issue of repeat prescription: Secondary | ICD-10-CM

## 2020-12-28 NOTE — Telephone Encounter (Signed)
Okay for refill?    LOV 08/25/20  Last Refill    11/23/20   30   QTY. 0 Refills

## 2021-01-06 ENCOUNTER — Other Ambulatory Visit: Payer: Self-pay | Admitting: Adult Health

## 2021-01-24 ENCOUNTER — Other Ambulatory Visit: Payer: Self-pay | Admitting: Adult Health

## 2021-01-25 ENCOUNTER — Other Ambulatory Visit: Payer: Self-pay

## 2021-01-25 ENCOUNTER — Other Ambulatory Visit: Payer: Self-pay | Admitting: Adult Health

## 2021-01-25 MED ORDER — TRAMADOL HCL 50 MG PO TABS: 50.0000 mg | ORAL_TABLET | Freq: Three times a day (TID) | ORAL | 2 refills | Status: DC

## 2021-01-25 NOTE — Telephone Encounter (Signed)
Not sure if I sent this one to you already

## 2021-01-25 NOTE — Telephone Encounter (Signed)
Rx faxed for refill of Tramadol 50 MG tablet  Okay for refill?

## 2021-02-16 ENCOUNTER — Other Ambulatory Visit: Payer: Self-pay | Admitting: Adult Health

## 2021-02-16 ENCOUNTER — Other Ambulatory Visit: Payer: Self-pay | Admitting: Family Medicine

## 2021-03-16 ENCOUNTER — Other Ambulatory Visit: Payer: Self-pay | Admitting: Adult Health

## 2021-03-16 DIAGNOSIS — Z760 Encounter for issue of repeat prescription: Secondary | ICD-10-CM

## 2021-03-21 ENCOUNTER — Other Ambulatory Visit: Payer: Self-pay | Admitting: Adult Health

## 2021-03-21 DIAGNOSIS — Z760 Encounter for issue of repeat prescription: Secondary | ICD-10-CM

## 2021-04-16 ENCOUNTER — Other Ambulatory Visit: Payer: Self-pay | Admitting: Adult Health

## 2021-04-16 DIAGNOSIS — Z760 Encounter for issue of repeat prescription: Secondary | ICD-10-CM

## 2021-04-16 DIAGNOSIS — I10 Essential (primary) hypertension: Secondary | ICD-10-CM

## 2021-04-20 ENCOUNTER — Other Ambulatory Visit: Payer: Self-pay | Admitting: Adult Health

## 2021-04-20 DIAGNOSIS — Z760 Encounter for issue of repeat prescription: Secondary | ICD-10-CM

## 2021-04-20 MED ORDER — TEMAZEPAM 15 MG PO CAPS: ORAL_CAPSULE | ORAL | 2 refills | Status: DC

## 2021-04-24 ENCOUNTER — Emergency Department (HOSPITAL_COMMUNITY): Payer: Medicare Other

## 2021-04-24 ENCOUNTER — Ambulatory Visit
Admission: EM | Admit: 2021-04-24 | Discharge: 2021-04-24 | Disposition: A | Discharge status: Home / Self Care | Payer: Medicare Other | Attending: Family Medicine | Admitting: Family Medicine

## 2021-04-24 ENCOUNTER — Emergency Department (HOSPITAL_COMMUNITY)
Admission: EM | Admit: 2021-04-24 | Discharge: 2021-04-24 | Disposition: A | Discharge status: Home / Self Care | Payer: Medicare Other | Attending: Emergency Medicine | Admitting: Emergency Medicine

## 2021-04-24 ENCOUNTER — Other Ambulatory Visit: Payer: Self-pay

## 2021-04-24 ENCOUNTER — Encounter (HOSPITAL_COMMUNITY): Payer: Self-pay | Admitting: *Deleted

## 2021-04-24 DIAGNOSIS — I2510 Atherosclerotic heart disease of native coronary artery without angina pectoris: Secondary | ICD-10-CM | POA: Diagnosis not present

## 2021-04-24 DIAGNOSIS — W19XXXA Unspecified fall, initial encounter: Secondary | ICD-10-CM | POA: Diagnosis not present

## 2021-04-24 DIAGNOSIS — Z7901 Long term (current) use of anticoagulants: Secondary | ICD-10-CM | POA: Insufficient documentation

## 2021-04-24 DIAGNOSIS — Z79899 Other long term (current) drug therapy: Secondary | ICD-10-CM | POA: Diagnosis not present

## 2021-04-24 DIAGNOSIS — R6883 Chills (without fever): Secondary | ICD-10-CM

## 2021-04-24 DIAGNOSIS — R0789 Other chest pain: Secondary | ICD-10-CM | POA: Diagnosis present

## 2021-04-24 DIAGNOSIS — Z20822 Contact with and (suspected) exposure to covid-19: Secondary | ICD-10-CM | POA: Insufficient documentation

## 2021-04-24 DIAGNOSIS — E876 Hypokalemia: Secondary | ICD-10-CM | POA: Diagnosis not present

## 2021-04-24 DIAGNOSIS — I482 Chronic atrial fibrillation, unspecified: Secondary | ICD-10-CM | POA: Diagnosis not present

## 2021-04-24 DIAGNOSIS — I5030 Unspecified diastolic (congestive) heart failure: Secondary | ICD-10-CM | POA: Diagnosis not present

## 2021-04-24 DIAGNOSIS — R079 Chest pain, unspecified: Secondary | ICD-10-CM | POA: Diagnosis not present

## 2021-04-24 DIAGNOSIS — I110 Hypertensive heart disease with heart failure: Secondary | ICD-10-CM | POA: Insufficient documentation

## 2021-04-24 DIAGNOSIS — R911 Solitary pulmonary nodule: Secondary | ICD-10-CM | POA: Insufficient documentation

## 2021-04-24 DIAGNOSIS — R200 Anesthesia of skin: Secondary | ICD-10-CM

## 2021-04-24 DIAGNOSIS — Z8501 Personal history of malignant neoplasm of esophagus: Secondary | ICD-10-CM | POA: Diagnosis not present

## 2021-04-24 LAB — CBC WITH DIFFERENTIAL/PLATELET
Abs Immature Granulocytes: 0.02 10*3/uL (ref 0.00–0.07)
Basophils Absolute: 0 10*3/uL (ref 0.0–0.1)
Basophils Relative: 0 %
Eosinophils Absolute: 0.1 10*3/uL (ref 0.0–0.5)
Eosinophils Relative: 2 %
HCT: 41.3 % (ref 36.0–46.0)
Hemoglobin: 13.7 g/dL (ref 12.0–15.0)
Immature Granulocytes: 0 %
Lymphocytes Relative: 18 %
Lymphs Abs: 1.2 10*3/uL (ref 0.7–4.0)
MCH: 28.8 pg (ref 26.0–34.0)
MCHC: 33.2 g/dL (ref 30.0–36.0)
MCV: 86.8 fL (ref 80.0–100.0)
Monocytes Absolute: 0.6 10*3/uL (ref 0.1–1.0)
Monocytes Relative: 8 %
Neutro Abs: 4.8 10*3/uL (ref 1.7–7.7)
Neutrophils Relative %: 72 %
Platelets: 152 10*3/uL (ref 150–400)
RBC: 4.76 MIL/uL (ref 3.87–5.11)
RDW: 12.9 % (ref 11.5–15.5)
WBC: 6.8 10*3/uL (ref 4.0–10.5)
nRBC: 0 % (ref 0.0–0.2)

## 2021-04-24 LAB — COMPREHENSIVE METABOLIC PANEL
ALT: 19 U/L (ref 0–44)
AST: 17 U/L (ref 15–41)
Albumin: 3.8 g/dL (ref 3.5–5.0)
Alkaline Phosphatase: 122 U/L (ref 38–126)
Anion gap: 8 (ref 5–15)
BUN: 16 mg/dL (ref 8–23)
CO2: 36 mmol/L — ABNORMAL HIGH (ref 22–32)
Calcium: 8.6 mg/dL — ABNORMAL LOW (ref 8.9–10.3)
Chloride: 95 mmol/L — ABNORMAL LOW (ref 98–111)
Creatinine, Ser: 1.15 mg/dL — ABNORMAL HIGH (ref 0.44–1.00)
GFR, Estimated: 48 mL/min — ABNORMAL LOW (ref >60)
Glucose, Bld: 147 mg/dL — ABNORMAL HIGH (ref 70–99)
Potassium: 2.7 mmol/L — CL (ref 3.5–5.1)
Sodium: 139 mmol/L (ref 135–145)
Total Bilirubin: 0.7 mg/dL (ref 0.3–1.2)
Total Protein: 6.9 g/dL (ref 6.5–8.1)

## 2021-04-24 LAB — RESP PANEL BY RT-PCR (FLU A&B, COVID) ARPGX2
Influenza A by PCR: NEGATIVE
Influenza B by PCR: NEGATIVE
SARS Coronavirus 2 by RT PCR: NEGATIVE

## 2021-04-24 LAB — URINALYSIS, ROUTINE W REFLEX MICROSCOPIC
Bilirubin Urine: NEGATIVE
Glucose, UA: NEGATIVE mg/dL
Ketones, ur: NEGATIVE mg/dL
Leukocytes,Ua: NEGATIVE
Nitrite: NEGATIVE
Protein, ur: NEGATIVE mg/dL
Specific Gravity, Urine: 1.009 (ref 1.005–1.030)
pH: 6 (ref 5.0–8.0)

## 2021-04-24 LAB — BRAIN NATRIURETIC PEPTIDE: B Natriuretic Peptide: 222 pg/mL — ABNORMAL HIGH (ref 0.0–100.0)

## 2021-04-24 LAB — PROTIME-INR
INR: 2.2 — ABNORMAL HIGH (ref 0.8–1.2)
Prothrombin Time: 24.8 seconds — ABNORMAL HIGH (ref 11.4–15.2)

## 2021-04-24 LAB — TROPONIN I (HIGH SENSITIVITY)
Troponin I (High Sensitivity): 6 ng/L (ref <18)
Troponin I (High Sensitivity): 6 ng/L (ref <18)

## 2021-04-24 LAB — TYPE AND SCREEN
ABO/RH(D): A POS
Antibody Screen: NEGATIVE

## 2021-04-24 LAB — LACTIC ACID, PLASMA: Lactic Acid, Venous: 1.4 mmol/L (ref 0.5–1.9)

## 2021-04-24 LAB — LIPASE, BLOOD: Lipase: 31 U/L (ref 11–51)

## 2021-04-24 MED ORDER — SODIUM CHLORIDE 0.9 % IV BOLUS
500.0000 mL | Freq: Once | INTRAVENOUS | Status: AC
  Administered 2021-04-24: 500 mL via INTRAVENOUS

## 2021-04-24 MED ORDER — APIXABAN 5 MG PO TABS
5.0000 mg | ORAL_TABLET | Freq: Once | ORAL | Status: AC
  Administered 2021-04-24: 5 mg via ORAL
  Filled 2021-04-24: qty 1

## 2021-04-24 MED ORDER — POTASSIUM CHLORIDE 10 MEQ/100ML IV SOLN
10.0000 meq | INTRAVENOUS | Status: AC
Start: 2021-04-24 — End: 2021-04-24
  Administered 2021-04-24 (×2): 10 meq via INTRAVENOUS
  Filled 2021-04-24 (×2): qty 100

## 2021-04-24 MED ORDER — POTASSIUM CHLORIDE ER 10 MEQ PO TBCR: 10.0000 meq | EXTENDED_RELEASE_TABLET | Freq: Every day | ORAL | 0 refills | Status: DC

## 2021-04-24 MED ORDER — POTASSIUM CHLORIDE CRYS ER 20 MEQ PO TBCR
40.0000 meq | EXTENDED_RELEASE_TABLET | Freq: Once | ORAL | Status: AC
  Administered 2021-04-24: 40 meq via ORAL
  Filled 2021-04-24: qty 2

## 2021-04-24 NOTE — ED Triage Notes (Signed)
Numbness that started in her head yesterday.  She feel last week with no injury that she can remember.  She is on blood thinner.

## 2021-04-24 NOTE — Discharge Instructions (Addendum)
You were seen in the emergency department for not feeling well for a few days.  Having intermittent chest pain, numb feeling in your head and feeling generally weak.  You had a CAT scan of your head along with chest x-ray and left hip x-ray, blood work and EKG.  There was no evidence of a heart attack.  Your potassium was quite low and you received some potassium here and we are prescribing you some to take at home.  Please drink plenty of fluids.  Follow-up with your primary care doctor.  Return to the emergency department if any worsening or concerning symptoms

## 2021-04-24 NOTE — ED Triage Notes (Signed)
Patient referred from urgent care for evaluation of chest pain, patient has chronic a fib

## 2021-04-24 NOTE — ED Provider Notes (Signed)
[facility] Provider Note   CSN: [id number] Arrival date & time: 04/24/21  1542     History No chief complaint on file.   [name] is a 80 y.o. female.  He is brought in by her son for evaluation of weakness for the last few days.  Intermittent shortness of breath intermittent chest pain.  She said she feels numb in her head.  Sometimes some numbness in her left arm.  Son said she is required blood transfusions in the past.  She denies any abdominal pain.  She had a fall about a week ago and injured her left hip.  The history is provided by the patient and a relative.  Chest Pain Pain location:  L chest Pain severity:  Moderate Onset quality:  Gradual Duration:  4 days Timing:  Intermittent Progression:  Unchanged Chronicity:  New Relieved by:  None tried Worsened by:  Nothing Ineffective treatments:  None tried Associated symptoms: cough, dizziness, numbness and shortness of breath   Associated symptoms: no abdominal pain, no fever, no lower extremity edema and no vomiting   Risk factors: coronary artery disease       Past Medical History:  Diagnosis Date   Anemia 2005   Generally microcytic, transfusions in [address], 2012, 02/2015, 05/2015   Aortic stenosis    Moderate November 2017   Arthritis    Broken back 2013   Chronic back pain.    CAD (coronary artery disease)    Cardiac catheterization 2014 - 80% mid RCA and 70% OM managed medically   CHF (congestive heart failure) ([name])    Chronic bilateral pleural effusions    Chronic headache    Contrast media allergy    Diastolic heart failure ([name])    Diverticulosis    Esophageal cancer ([place]) 05/06/2015   Adenocarcinoma GE junction   Facial paresthesia    GERD (gastroesophageal reflux disease)    H. pylori infection    H/O iron deficiency    05-06-15 iron infusion ([facility])   HH (hiatus hernia) 2008   Large with associated erosions   Hypertension    Paroxysmal atrial fibrillation ([name])     PONV (postoperative nausea and vomiting)    Pulmonary emboli ([place]) 2008, 2012   Stroke [facility]) 1982   Swallowing dysfunction     Patient Active Problem List   Diagnosis Date Noted   Vertigo 11/28/2020   Persistent atrial fibrillation ([place])    Epistaxis, recurrent 03/20/2019   Stroke ([place]) 03/07/2019   S/P left knee arthroscopy 04/18/18 06/06/2018   Acute medial meniscus tear, left, subsequent encounter 04/18/2018   Bilateral pleural effusion    Peripheral neuropathy 12/21/2017   CAD (coronary artery disease) 12/19/2017   Numbness of face 01/09/2017   Unstable angina ([place]) 11/02/2016   B12 deficiency 10/16/2016   Hyperglycemia 06/26/2016   Encounter for medication review and counseling 06/07/2016   [place] Must Quervain's disease (radial styloid tenosynovitis) 03/23/2016   Trigger finger, left middle finger 03/23/2016   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   Aortic stenosis, mild 06/23/2015   Essential (primary) hypertension 06/23/2015   History of CVA (cerebrovascular accident) 06/23/2015   Malignant tumor of lower third of esophagus ([place]) 06/23/2015   GE junction carcinoma ([place]) 05/14/2015   [place]on lesion, acute    IDA (iron deficiency anemia)    Mixed hyperlipidemia 05/05/2015   GERD (gastroesophageal reflux disease) 05/05/2015   Absolute anemia    Osteoarthritis 04/16/2015   History of pulmonary embolism 03/02/2015   Benign neoplasm  of descending colon 02/17/2015   Diverticulosis of large intestine without diverticulitis 02/17/2015   Esophageal stricture 02/16/2015   Macular degeneration 09/01/2014   Benign paroxysmal positional vertigo    Uncontrolled hypertension 08/24/2014   Sleep disturbance 08/03/2014   PAF (paroxysmal atrial fibrillation) ([place]) 01/21/2014   Long term current use of anticoagulant therapy 23/55/7322   Diastolic heart failure ([place]) 01/21/2014    Past Surgical History:  Procedure Laterality Date   APPENDECTOMY  1950s   CARDIOVERSION N/A 07/08/2019    Procedure: CARDIOVERSION;  Surgeon: Pixie Casino, MD;  Location: [facility] ENDOSCOPY;  Service: Cardiovascular;  Laterality: N/A;   CARPAL TUNNEL RELEASE Bilateral [place]  2014   CHOLECYSTECTOMY  1980s   open   COLONOSCOPY N/A 02/17/2015   Procedure: COLONOSCOPY;  Surgeon: [name], MD;  Location: [facility] ENDOSCOPY;  Service: Endoscopy;  Laterality: N/A;   ESOPHAGOGASTRODUODENOSCOPY N/A 02/16/2015   Procedure: ESOPHAGOGASTRODUODENOSCOPY (EGD);  Surgeon: [name], MD;  Location: [facility] ENDOSCOPY;  Service: Endoscopy;  Laterality: N/A;   ESOPHAGOGASTRODUODENOSCOPY N/A 05/06/2015   Procedure: ESOPHAGOGASTRODUODENOSCOPY (EGD);  Surgeon: [name], MD;  Location: [facility] ENDOSCOPY;  Service: Endoscopy;  Laterality: N/A;   EUS N/A 05/20/2015   Procedure: UPPER ENDOSCOPIC ULTRASOUND (EUS) LINEAR;  Surgeon: [name], MD;  Location: [facility] ENDOSCOPY;  Service: Endoscopy;  Laterality: N/A;   exploratory lab  1950s or [place] N/A 05/06/2015   Procedure: GIVENS CAPSULE STUDY;  Surgeon: [name], MD;  Location: [facility] ENDOSCOPY;  Service: Endoscopy;  Laterality: N/A;   KNEE ARTHROSCOPY WITH MEDIAL MENISECTOMY Left 04/18/2018   Procedure: LEFT KNEE ARTHROSCOPY WITH PARTIAL MEDIAL MENISECTOMY AND ANTERIOR CRUCIATE LIGAMENT DEBRIDEMENT;  Surgeon: [name], MD;  Location: [facility] ORS;  Service: Orthopedics;  Laterality: Left;   LEFT HEART CATH AND CORONARY ANGIOGRAPHY N/A 11/08/2016   Procedure: Left Heart Cath and Coronary Angiography;  Surgeon: [name], MD;  Location: [place] CV LAB;  Service: Cardiovascular;  Laterality: N/A;   LEFT HEART CATH AND CORONARY ANGIOGRAPHY N/A 08/19/2019   Procedure: LEFT HEART CATH AND CORONARY ANGIOGRAPHY;  Surgeon: Martinique, Peter M, MD;  Location: [place] CV LAB;  Service: Cardiovascular;  Laterality: N/A;   lumbar back surgery  2012   [place]   TONSILLECTOMY AND ADENOIDECTOMY  1960s     OB  History   No obstetric history on file.     Family History  Problem Relation Age of Onset   Stroke Mother    Heart disease Mother    Emphysema Father    Liver disease Sister    Ovarian cancer Sister    Stroke Sister    Other Child        died at birth   Colon cancer Neg Hx    Esophageal cancer Neg Hx    Stomach cancer Neg Hx    Pancreatic cancer Neg Hx     Social History   Tobacco Use   Smoking status: Never   Smokeless tobacco: Never  Vaping Use   Vaping Use: Never used  Substance Use Topics   Alcohol use: No    Alcohol/week: 0.0 standard drinks   Drug use: No    Home Medications Prior to Admission medications   Medication Sig Start Date End Date Taking? Authorizing Provider  acetaminophen (TYLENOL) 500 MG tablet Take 500 mg by mouth daily as needed for mild pain or moderate pain.  [provider]  amLODipine (NORVASC) 5 MG tablet TAKE (1) TABLET BY MOUTH ONCE DAILY. Patient taking differently: Take 5 mg by mouth daily. 06/23/20   Hilty, Nadean Corwin, MD  apixaban (ELIQUIS) 5 MG TABS tablet Take 1 tablet (5 mg total) by mouth 2 (two) times daily. 07/20/20 10/18/20  Nafziger, Tommi Rumps, NP  atorvastatin (LIPITOR) 80 MG tablet TAKE 1 TABLET BY MOUTH ONCE A DAY. 04/19/21   Nafziger, Tommi Rumps, NP  buPROPion (WELLBUTRIN XL) 150 MG 24 hr tablet TAKE (1) TABLET BY MOUTH ONCE DAILY. Patient taking differently: Take 150 mg by mouth daily. 11/23/20   Nafziger, Tommi Rumps, NP  carvedilol (COREG) 25 MG tablet TAKE (1) TABLET BY MOUTH TWICE DAILY WITH A MEAL. 04/19/21   Nafziger, Tommi Rumps, NP  cholecalciferol (VITAMIN D3) 25 MCG (1000 UNIT) tablet Take 1,000 Units by mouth daily.    [provider]  furosemide (LASIX) 40 MG tablet TAKE 1 TABLET BY MOUTH ONCE DAILY. 04/19/21   Nafziger, Tommi Rumps, NP  meclizine (ANTIVERT) 25 MG tablet Take 1 tablet (25 mg total) by mouth as needed for dizziness or nausea. Patient taking differently: Take 25 mg by mouth 3 (three) times daily as needed for  dizziness or nausea. 08/02/18   Nafziger, Tommi Rumps, NP  Melatonin 5 MG CAPS Take 10 mg by mouth at bedtime.    [provider]  pantoprazole (PROTONIX) 40 MG tablet TAKE 1 TABLET BY MOUTH TWICE DAILY. 04/19/21   Nafziger, Tommi Rumps, NP  Pediatric Multiple Vitamins (FLINTSTONES MULTIVITAMIN PO) Take 1 tablet by mouth every evening.    [provider]  temazepam (RESTORIL) 15 MG capsule TAKE (1) CAPSULE BY MOUTH AT BEDTIME. 04/20/21   Nafziger, Tommi Rumps, NP  traMADol (ULTRAM) 50 MG tablet Take 1 tablet (50 mg total) by mouth every 8 (eight) hours. TAKE (1) TABLET BY MOUTH EVERY 8 HOURS. 01/25/21   Nafziger, Tommi Rumps, NP  vitamin C (ASCORBIC ACID) 500 MG tablet Take 500 mg by mouth daily.    [provider]    Allergies    Iodinated diagnostic agents, Iodine, Ioxaglate, Lactose intolerance (gi), Red dye, Hydralazine, Milk-related compounds, and Propoxyphene  Review of Systems   Review of Systems  Constitutional:  Negative for fever.  HENT:  Negative for sore throat.   Eyes:  Negative for visual disturbance.  Respiratory:  Positive for cough and shortness of breath.   Cardiovascular:  Positive for chest pain.  Gastrointestinal:  Negative for abdominal pain and vomiting.  Genitourinary:  Negative for dysuria.  Musculoskeletal:  Positive for gait problem.  Skin:  Negative for rash.  Neurological:  Positive for dizziness and numbness.   Physical Exam Updated Vital Signs BP (!) 158/86   Pulse 91   Temp 98.7 F (37.1 C) (Oral)   Resp 20   SpO2 93%   Physical Exam Vitals and nursing note reviewed.  Constitutional:      General: She is not in acute distress.    Appearance: Normal appearance. She is well-developed.  HENT:     Head: Normocephalic and atraumatic.  Eyes:     Conjunctiva/sclera: Conjunctivae normal.  Cardiovascular:     Rate and Rhythm: Normal rate. Rhythm irregular.     Heart sounds: No murmur heard. Pulmonary:     Effort: Pulmonary effort is normal. No  respiratory distress.     Breath sounds: Normal breath sounds.  Abdominal:     Palpations: Abdomen is soft.     Tenderness: There is no abdominal tenderness. There is no guarding or  rebound.  Musculoskeletal:        General: No deformity or signs of injury. Normal range of motion.     Cervical back: Neck supple.     Right lower leg: No edema.     Left lower leg: No edema.  Skin:    General: Skin is warm and dry.  Neurological:     General: No focal deficit present.     Mental Status: She is alert.     Cranial Nerves: No cranial nerve deficit.     Sensory: No sensory deficit.     Motor: No weakness.    ED Results / Procedures / Treatments   Labs (all labs ordered are listed, but only abnormal results are displayed) Labs Reviewed  COMPREHENSIVE METABOLIC PANEL - Abnormal; Notable for the following components:      Result Value   Potassium 2.7 (*)    Chloride 95 (*)    CO2 36 (*)    Glucose, Bld 147 (*)    Creatinine, Ser 1.15 (*)    Calcium 8.6 (*)    GFR, Estimated 48 (*)    All other components within normal limits  BRAIN NATRIURETIC PEPTIDE - Abnormal; Notable for the following components:   B Natriuretic Peptide 222.0 (*)    All other components within normal limits  PROTIME-INR - Abnormal; Notable for the following components:   Prothrombin Time 24.8 (*)    INR 2.2 (*)    All other components within normal limits  URINALYSIS, ROUTINE W REFLEX MICROSCOPIC - Abnormal; Notable for the following components:   Hgb urine dipstick MODERATE (*)    Bacteria, UA RARE (*)    All other components within normal limits  RESP PANEL BY RT-PCR (FLU A&B, COVID) ARPGX2  LIPASE, BLOOD  LACTIC ACID, PLASMA  CBC WITH DIFFERENTIAL/PLATELET  TYPE AND SCREEN  TROPONIN I (HIGH SENSITIVITY)  TROPONIN I (HIGH SENSITIVITY)    EKG EKG Interpretation  Date/Time:  Sunday April 24 2021 16:48:04 EDT Ventricular Rate:  74 PR Interval:    QRS Duration: 91 QT Interval:  396 QTC  Calculation: 440 R Axis:   13 Text Interpretation: Atrial fibrillation No significant change since prior today Confirmed by Aletta Edouard [phone]) on 04/24/2021 5:37:36 PM  Radiology CT Head Wo Contrast  Result Date: 04/24/2021 CLINICAL DATA:  Numbness that started in the patient's head yesterday. EXAM: CT HEAD WITHOUT CONTRAST TECHNIQUE: Contiguous axial images were obtained from the base of the skull through the vertex without intravenous contrast. COMPARISON:  CT head dated 11/28/2020. FINDINGS: Brain: No evidence of acute infarction, hemorrhage, hydrocephalus, extra-axial collection or mass lesion/mass effect. Periventricular white matter hypoattenuation likely represents chronic small vessel ischemic disease. Chronic lacunar infarcts in the right basal ganglia, right thalamus, right corona radiata, and right cerebellum are redemonstrated. Vascular: There are vascular calcifications in the carotid siphons. Skull: Normal. Negative for fracture or focal lesion. Sinuses/Orbits: No acute finding. Other: None. IMPRESSION: No acute intracranial process. Electronically Signed   By: Zerita Boers M.D.   On: 04/24/2021 18:42   DG Chest Port 1 View  Result Date: 04/24/2021 CLINICAL DATA:  cp EXAM: PORTABLE CHEST 1 VIEW COMPARISON:  Nov 28, 2020 FINDINGS: The cardiomediastinal silhouette is unchanged in contour.Atherosclerotic calcifications of the tortuous thoracic aorta. Unchanged blunting of the RIGHT costophrenic angle which may reflect a small pleural effusion. Unchanged perihilar vascular prominence. No pneumothorax. There is a possible 6 mm nodular opacity projecting over the LEFT mid lung. Visualized abdomen is unremarkable. Status post  ACDF. IMPRESSION: Possible nodular opacity of the LEFT mid lung. Recommend follow-up PA and lateral chest radiograph in 3-4 weeks versus CT chest after appropriate treatment to assess for resolution. Electronically Signed   By: Valentino Saxon M.D.   On: 04/24/2021  17:37   DG Hip Unilat With Pelvis 2-3 Views Left  Result Date: 04/24/2021 CLINICAL DATA:  Fall, pain EXAM: DG HIP (WITH OR WITHOUT PELVIS) 2-3V LEFT COMPARISON:  None. FINDINGS: There is no evidence of hip fracture or dislocation. Degenerative changes in the bilateral hip joints, with left-greater-than-right joint-space narrowing. Fixation hardware overlying the inferior lumbar spine. Vascular calcifications. IMPRESSION: No acute fracture or dislocation. Electronically Signed   By: Merilyn Baba M.D.   On: 04/24/2021 19:04    Procedures Procedures   Medications Ordered in ED Medications  potassium chloride 10 mEq in 100 mL IVPB (0 mEq Intravenous Stopped 04/24/21 1955)  potassium chloride SA (KLOR-CON) CR tablet 40 mEq (40 mEq Oral Given 04/24/21 1747)  apixaban (ELIQUIS) tablet 5 mg (5 mg Oral Given 04/24/21 1802)  sodium chloride 0.9 % bolus 500 mL (0 mLs Intravenous Stopped 04/24/21 1956)    ED Course  I have reviewed the triage vital signs and the nursing notes.  Pertinent labs & imaging results that were available during my care of the patient were reviewed by me and considered in my medical decision making (see chart for details).  Clinical Course as of 04/25/21 1027  Sun Apr 24, 2021  [id number] Reviewed work-up with son and patient.  Have repleted her potassium.  We will start on some oral potassium supplement recommended close follow-up with her PCP.  Also informed them about the pulmonary nodule on chest x-ray that we will need follow-up. [MB]    Clinical Course User Index [MB] Miranda Rasmussen, MD   MDM Rules/Calculators/A&P                          [name] was evaluated in Emergency Department on 04/24/2021 for the symptoms described in the history of present illness. She was evaluated in the context of the global COVID-19 pandemic, which necessitated consideration that the patient might be at risk for infection with the SARS-CoV-2 virus that causes COVID-19.  Institutional protocols and algorithms that pertain to the evaluation of patients at risk for COVID-19 are in a state of rapid change based on information released by regulatory bodies including the CDC and federal and state organizations. These policies and algorithms were followed during the patient's care in the ED.  This patient complains of generally not feeling well, intermittent chest pain, numbness to her head, recent fall and left hip pain; this involves an extensive number of treatment Options and is a complaint that carries with it a high risk of complications and Morbidity. The differential includes ACS, pneumonia, pneumothorax, PE, hip fracture, cranial bleed, stroke  I ordered, reviewed and interpreted labs, which included CBC with normal white count normal hemoglobin, chemistries with low potassium, troponins flat, BNP elevated although lower than priors, lactate normal, urinalysis without clear signs of infection I ordered medication IV fluids oral anticoagulation, oral potassium and IV potassium I ordered imaging studies which included CT head chest x-ray left hip x-ray and I independently    visualized and interpreted imaging which showed no acute findings.  Patient does have a long nodule that will need follow-up Additional history obtained from patient's son Previous records obtained and reviewed in epic, no recent admissions  After the interventions stated above, I reevaluated the patient and found patient to be feeling somewhat improved after fluids.  Reviewed work-up with her and her son.  They are comfortable plan for outpatient follow-up with her provider.  Return instructions discussed  CHA2DS2/VAS Stroke Risk Points  Current as of 9 minutes ago     8 >= 2 Points: High Risk  1 - 1.99 Points: Medium Risk  0 Points: Low Risk    No Change      Details    This score determines the patient's risk of having astroke if the  patient has atrial fibrillation.       Points  Metrics  1 Has Congestive Heart Failure:  Yes    Current as of 9 minutes ago  1 Has Vascular Disease:  Yes    Current as of 9 minutes ago  1 Has Hypertension:  Yes    Current as of 9 minutes ago  2 Age:  51    Current as of 9 minutes ago  0 Has Diabetes:  No    Current as of 9 minutes ago  2 Had Stroke:  Yes  Had TIA:  No  Had Thromboembolism:  Yes    Current as of 9 minutes ago  1 Female:  Yes    Current as of 9 minutes ago            Final Clinical Impression(s) / ED Diagnoses Final diagnoses:  Nonspecific chest pain  Hypokalemia  Pulmonary nodule  Chronic atrial fibrillation ([place])    Rx / DC Orders ED Discharge Orders     None        Miranda Rasmussen, MD 04/25/21 1031

## 2021-04-26 ENCOUNTER — Inpatient Hospital Stay: Payer: Medicare Other | Admitting: Adult Health

## 2021-05-03 ENCOUNTER — Other Ambulatory Visit: Payer: Self-pay | Admitting: Adult Health

## 2021-05-03 ENCOUNTER — Encounter: Payer: Self-pay | Admitting: Adult Health

## 2021-05-03 ENCOUNTER — Other Ambulatory Visit: Payer: Self-pay

## 2021-05-03 ENCOUNTER — Ambulatory Visit (INDEPENDENT_AMBULATORY_CARE_PROVIDER_SITE_OTHER): Payer: Medicare Other | Admitting: Adult Health

## 2021-05-03 VITALS — BP 108/80 | HR 52 | Temp 97.0°F | Wt 95.0 lb

## 2021-05-03 DIAGNOSIS — I4819 Other persistent atrial fibrillation: Secondary | ICD-10-CM

## 2021-05-03 DIAGNOSIS — M542 Cervicalgia: Secondary | ICD-10-CM

## 2021-05-03 DIAGNOSIS — E876 Hypokalemia: Secondary | ICD-10-CM | POA: Diagnosis not present

## 2021-05-03 DIAGNOSIS — R911 Solitary pulmonary nodule: Secondary | ICD-10-CM | POA: Diagnosis not present

## 2021-05-03 LAB — BASIC METABOLIC PANEL
BUN: 14 mg/dL (ref 6–23)
CO2: 34 mEq/L — ABNORMAL HIGH (ref 19–32)
Calcium: 9.1 mg/dL (ref 8.4–10.5)
Chloride: 101 mEq/L (ref 96–112)
Creatinine, Ser: 1.08 mg/dL (ref 0.40–1.20)
GFR: 48.11 mL/min — ABNORMAL LOW (ref >60.00)
Glucose, Bld: 148 mg/dL — ABNORMAL HIGH (ref 70–99)
Potassium: 3.9 mEq/L (ref 3.5–5.1)
Sodium: 142 mEq/L (ref 135–145)

## 2021-05-03 MED ORDER — POTASSIUM CHLORIDE ER 10 MEQ PO TBCR: 10.0000 meq | EXTENDED_RELEASE_TABLET | Freq: Every day | ORAL | 3 refills | Status: DC

## 2021-05-03 NOTE — Progress Notes (Signed)
Subjective:    Patient ID: [name], female    DOB: [birth year], 80 y.o.   MRN: [id number]  HPI 80 year old female who  has a past medical history of Anemia (2005), Aortic stenosis, Arthritis, Broken back (2013), CAD (coronary artery disease), CHF (congestive heart failure) ([place]), Chronic bilateral pleural effusions, Chronic headache, Contrast media allergy, Diastolic heart failure ([place]), Diverticulosis, Esophageal cancer ([place]) (05/06/2015), Facial paresthesia, GERD (gastroesophageal reflux disease), H. pylori infection, H/O iron deficiency, HH (hiatus hernia) (2008), Hypertension, Paroxysmal atrial fibrillation ([place]), PONV (postoperative nausea and vomiting), Pulmonary emboli ([place]) (2008, 2012), Stroke [facility]) (1982), and Swallowing dysfunction.  She is being evaluated today for ER follow up.   He was seen in the emergency room 7 days ago.  Was brought in by her son for evaluation of weakness for the few days prior.  She had intermittent shortness of breath and intermittent chest pain as well.  She stated that she also felt numb in her head.  Sometimes some numbness in her left arm.  Work-up in the ER showed a normal CBC, BNP was elevated although lower than priors, lactate normal, urinalysis without clear signs of infection.  BMP showed a potassium of 2.7, she was given IV potassium in the emergency room and sent home with a prescription for 10 mEq of oral potassium.  Imaging including CT of the head and chest x-ray, left hip showed no acute findings.  She did have a lung nodule in her left mid lung that was recommended follow-up in 3 to 4 weeks.  EKG showed chronic atrial fibrillation  She reports that she has been taking her potassium every day but finds it difficult to swallow the pills.  She continues to have pain at the base of her left neck into the back of her head.  Worse with certain movements.  No radiating left arm pain.  He continues to have fatigue, this is a chronic issue,  she understands it comes from her atrial fibrillation.  Review of Systems See HPI   Past Medical History:  Diagnosis Date   Anemia 2005   Generally microcytic, transfusions in [address], 2012, 02/2015, 05/2015   Aortic stenosis    Moderate November 2017   Arthritis    Broken back 2013   Chronic back pain.    CAD (coronary artery disease)    Cardiac catheterization 2014 - 80% mid RCA and 70% OM managed medically   CHF (congestive heart failure) ([name])    Chronic bilateral pleural effusions    Chronic headache    Contrast media allergy    Diastolic heart failure ([name])    Diverticulosis    Esophageal cancer ([place]) 05/06/2015   Adenocarcinoma GE junction   Facial paresthesia    GERD (gastroesophageal reflux disease)    H. pylori infection    H/O iron deficiency    05-06-15 iron infusion ([facility])   HH (hiatus hernia) 2008   Large with associated erosions   Hypertension    Paroxysmal atrial fibrillation ([name])    PONV (postoperative nausea and vomiting)    Pulmonary emboli ([place]) 2008, 2012   Stroke [facility]) 1982   Swallowing dysfunction     Social History   Socioeconomic History   Marital status: Widowed    Spouse name: Not on file   Number of children: 3   Years of education: 14   Highest education level: Not on file  Occupational History   Occupation: Retired  Tobacco Use   Smoking status: Never  Smokeless tobacco: Never  Vaping Use   Vaping Use: Never used  Substance and Sexual Activity   Alcohol use: No    Alcohol/week: 0.0 standard drinks   Drug use: No   Sexual activity: Not Currently  Other Topics Concern   Not on file  Social History Narrative   Born and raised in [place], [place]. Currently resides in a house with her son. 1 dog. Fun: go to church   Divorced(Has total of #3 children)-[place], [place], Honor Junes   Denies religious beliefs that would effect health care.    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[employer] Strain: Low Risk    Difficulty of Paying Living Expenses: Not hard at all  Food Insecurity: No Food Insecurity   Worried About Charity fundraiser in the Last Year: Never true   Arboriculturist in the Last Year: Never true  Transportation Needs: No Transportation Needs   Lack of Transportation (Medical): No   Lack of Transportation (Non-Medical): No  Physical Activity: Inactive   Days of Exercise per Week: 0 days   Minutes of Exercise per Session: 0 min  Stress: No Stress Concern Present   Feeling of Stress : Not at all  Social Connections: Moderately Integrated   Frequency of Communication with Friends and Family: More than three times a week   Frequency of Social Gatherings with Friends and Family: More than three times a week   Attends Religious Services: More than 4 times per year   Active Member of [employer] or Organizations: Yes   Attends Archivist Meetings: More than 4 times per year   Marital Status: Widowed  Intimate Partner Violence: Not At Risk   Fear of Current or Ex-Partner: No   Emotionally Abused: No   Physically Abused: No   Sexually Abused: No    Past Surgical History:  Procedure Laterality Date   APPENDECTOMY  1950s   CARDIOVERSION N/A 07/08/2019   Procedure: CARDIOVERSION;  Surgeon: Pixie Casino, MD;  Location[facility] ENDOSCOPY;  Service: Cardiovascular;  Laterality: N/A;   CARPAL TUNNEL RELEASE Bilateral [place]  2014   CHOLECYSTECTOMY  1980s   open   COLONOSCOPY N/A 02/17/2015   Procedure: COLONOSCOPY;  Surgeon: [name], MD;  Location: [facility] ENDOSCOPY;  Service: Endoscopy;  Laterality: N/A;   ESOPHAGOGASTRODUODENOSCOPY N/A 02/16/2015   Procedure: ESOPHAGOGASTRODUODENOSCOPY (EGD);  Surgeon: [name], MD;  Location: [facility] ENDOSCOPY;  Service: Endoscopy;  Laterality: N/A;   ESOPHAGOGASTRODUODENOSCOPY N/A 05/06/2015   Procedure:  ESOPHAGOGASTRODUODENOSCOPY (EGD);  Surgeon: [name], MD;  Location: [facility] ENDOSCOPY;  Service: Endoscopy;  Laterality: N/A;   EUS N/A 05/20/2015   Procedure: UPPER ENDOSCOPIC ULTRASOUND (EUS) LINEAR;  Surgeon: [name], MD;  Location: [facility] ENDOSCOPY;  Service: Endoscopy;  Laterality: N/A;   exploratory lab  1950s or [place] N/A 05/06/2015   Procedure: GIVENS CAPSULE STUDY;  Surgeon: [name], MD;  Location[facility] ENDOSCOPY;  Service: Endoscopy;  Laterality: N/A;   KNEE ARTHROSCOPY WITH MEDIAL MENISECTOMY Left 04/18/2018   Procedure: LEFT KNEE ARTHROSCOPY WITH PARTIAL MEDIAL MENISECTOMY AND ANTERIOR CRUCIATE LIGAMENT DEBRIDEMENT;  Surgeon: [name], MD;  Location: [facility] ORS;  Service: Orthopedics;  Laterality: Left;   LEFT HEART CATH AND CORONARY ANGIOGRAPHY N/A 11/08/2016   Procedure: Left Heart Cath and Coronary Angiography;  Surgeon:  [name], MD;  Location: [place] CV LAB;  Service: Cardiovascular;  Laterality: N/A;   LEFT HEART CATH AND CORONARY ANGIOGRAPHY N/A 08/19/2019   Procedure: LEFT HEART CATH AND CORONARY ANGIOGRAPHY;  Surgeon: Martinique, Peter M, MD;  Location: [place] CV LAB;  Service: Cardiovascular;  Laterality: N/A;   lumbar back surgery  2012   [place]   TONSILLECTOMY AND ADENOIDECTOMY  1960s    Family History  Problem Relation Age of Onset   Stroke Mother    Heart disease Mother    Emphysema Father    Liver disease Sister    Ovarian cancer Sister    Stroke Sister    Other Child        died at birth   Colon cancer Neg Hx    Esophageal cancer Neg Hx    Stomach cancer Neg Hx    Pancreatic cancer Neg Hx     Allergies  Allergen Reactions   Iodinated Diagnostic Agents Anaphylaxis and Other (See Comments)    IPD dye Info given by patient   Iodine Anaphylaxis   Ioxaglate Anaphylaxis and Other (See Comments)   Lactose Intolerance (Gi) Anaphylaxis   Red Dye Anaphylaxis   Hydralazine Anxiety and Other  (See Comments)    Facial flushing, pt prefers not to use it.    Milk-Related Compounds Other (See Comments)    Lactose intolerance Can tolerate milk if its cooked into the recipe, just can't drink milk    Propoxyphene Rash and Anxiety    Current Outpatient Medications on File Prior to Visit  Medication Sig Dispense Refill   acetaminophen (TYLENOL) 500 MG tablet Take 500 mg by mouth daily as needed for mild pain or moderate pain.      amLODipine (NORVASC) 5 MG tablet TAKE (1) TABLET BY MOUTH ONCE DAILY. (Patient taking differently: Take 5 mg by mouth daily.) 90 tablet 3   apixaban (ELIQUIS) 5 MG TABS tablet Take 1 tablet (5 mg total) by mouth 2 (two) times daily. 180 tablet 3   atorvastatin (LIPITOR) 80 MG tablet TAKE 1 TABLET BY MOUTH ONCE A DAY. 30 tablet 0   buPROPion (WELLBUTRIN XL) 150 MG 24 hr tablet TAKE (1) TABLET BY MOUTH ONCE DAILY. (Patient taking differently: Take 150 mg by mouth daily.) 90 tablet 1   carvedilol (COREG) 25 MG tablet TAKE (1) TABLET BY MOUTH TWICE DAILY WITH A MEAL. 60 tablet 0   cholecalciferol (VITAMIN D3) 25 MCG (1000 UNIT) tablet Take 1,000 Units by mouth daily.     furosemide (LASIX) 40 MG tablet TAKE 1 TABLET BY MOUTH ONCE DAILY. 30 tablet 0   meclizine (ANTIVERT) 25 MG tablet Take 1 tablet (25 mg total) by mouth as needed for dizziness or nausea. (Patient taking differently: Take 25 mg by mouth 3 (three) times daily as needed for dizziness or nausea.) 90 tablet 1   Melatonin 5 MG CAPS Take 10 mg by mouth at bedtime.     pantoprazole (PROTONIX) 40 MG tablet TAKE 1 TABLET BY MOUTH TWICE DAILY. 60 tablet 0   Pediatric Multiple Vitamins (FLINTSTONES MULTIVITAMIN PO) Take 1 tablet by mouth every evening.     potassium chloride (KLOR-CON) 10 MEQ tablet Take 1 tablet (10 mEq total) by mouth daily. 7 tablet 0   temazepam (RESTORIL) 15 MG capsule TAKE (1) CAPSULE BY MOUTH AT BEDTIME. 30 capsule 2   traMADol (ULTRAM) 50 MG tablet Take 1 tablet (50 mg total) by mouth  every 8 (eight)  hours. TAKE (1) TABLET BY MOUTH EVERY 8 HOURS. 90 tablet 2   vitamin C (ASCORBIC ACID) 500 MG tablet Take 500 mg by mouth daily.     No current facility-administered medications on file prior to visit.    BP 108/80 (BP Location: Left Arm, Patient Position: Sitting, Cuff Size: Normal)   Pulse (!) 52   Temp (!) 97 F (36.1 C) (Temporal)   Wt 95 lb (43.1 kg)   BMI 17.38 kg/m        Objective:   Physical Exam Vitals and nursing note reviewed.  Constitutional:      Appearance: Normal appearance. She is underweight.  Cardiovascular:     Rate and Rhythm: Normal rate and regular rhythm.     Pulses: Normal pulses.     Heart sounds: Normal heart sounds.  Pulmonary:     Effort: Pulmonary effort is normal.     Breath sounds: Normal breath sounds.  Musculoskeletal:        General: Normal range of motion.     Comments: Has some discomfort with palpation to the left neck into the base of her skull.  Skin:    General: Skin is warm and dry.  Neurological:     General: No focal deficit present.     Mental Status: She is alert and oriented to person, place, and time.  Psychiatric:        Mood and Affect: Mood normal.        Behavior: Behavior normal.        Thought Content: Thought content normal.        Judgment: Judgment normal.      Assessment & Plan:  1. Hypokalemia  - Basic Metabolic Panel; Future - Basic Metabolic Panel  2. Neck pain - appears muscular in nature - Advised warm compress and stretching exercises  3. Persistent atrial fibrillation ([place]) - continue with current therapy   4. Pulmonary nodule - Does not want repeat xray or another other imagining  Dorothyann Peng, NP

## 2021-05-03 NOTE — Patient Instructions (Signed)
It was great seeing you today   I am going to repeat your blood work and then I will follow up with you   We will get you started on liquid potassium

## 2021-05-12 NOTE — ED Provider Notes (Signed)
Patient was sent to ED from triage room for further evaluation.  She has family and present to transport her via private vehicle.   Volney American, [place] 05/12/21 1711

## 2021-05-13 ENCOUNTER — Other Ambulatory Visit: Payer: Self-pay | Admitting: Adult Health

## 2021-06-14 ENCOUNTER — Other Ambulatory Visit: Payer: Self-pay | Admitting: Adult Health

## 2021-06-14 ENCOUNTER — Other Ambulatory Visit: Payer: Self-pay | Admitting: Internal Medicine

## 2021-06-14 DIAGNOSIS — I10 Essential (primary) hypertension: Secondary | ICD-10-CM

## 2021-06-14 DIAGNOSIS — Z760 Encounter for issue of repeat prescription: Secondary | ICD-10-CM

## 2021-06-16 ENCOUNTER — Other Ambulatory Visit: Payer: Self-pay | Admitting: Adult Health

## 2021-07-06 ENCOUNTER — Encounter: Payer: Self-pay | Admitting: Adult Health

## 2021-07-06 ENCOUNTER — Ambulatory Visit (INDEPENDENT_AMBULATORY_CARE_PROVIDER_SITE_OTHER): Payer: Medicare Other | Admitting: Adult Health

## 2021-07-06 VITALS — BP 110/58 | HR 88 | Temp 97.8°F | Ht 62.0 in | Wt 92.0 lb

## 2021-07-06 DIAGNOSIS — M25519 Pain in unspecified shoulder: Secondary | ICD-10-CM | POA: Diagnosis not present

## 2021-07-06 DIAGNOSIS — M542 Cervicalgia: Secondary | ICD-10-CM | POA: Diagnosis not present

## 2021-07-06 MED ORDER — PREDNISONE 10 MG PO TABS: 10.0000 mg | ORAL_TABLET | Freq: Every day | ORAL | 0 refills | Status: DC

## 2021-07-06 MED ORDER — BACLOFEN 5 MG PO TABS: 5.0000 mg | ORAL_TABLET | Freq: Every evening | ORAL | 0 refills | Status: DC | PRN

## 2021-07-06 NOTE — Progress Notes (Signed)
Subjective:    Patient ID: [name], female    DOB: Aug 04, 1940, 81 y.o.   MRN: [id number]  HPI 81 year old female who  has a past medical history of Anemia (2005), Aortic stenosis, Arthritis, Broken back (2013), CAD (coronary artery disease), CHF (congestive heart failure) ([place]), Chronic bilateral pleural effusions, Chronic headache, Contrast media allergy, Diastolic heart failure ([place]), Diverticulosis, Esophageal cancer ([place]) (05/06/2015), Facial paresthesia, GERD (gastroesophageal reflux disease), H. pylori infection, H/O iron deficiency, HH (hiatus hernia) (2008), Hypertension, Paroxysmal atrial fibrillation ([place]), PONV (postoperative nausea and vomiting), Pulmonary emboli ([place]) (2008, 2012), Stroke [facility]) (1982), and Swallowing dysfunction.  She presents to the office today for an acute issue.  Her symptoms have been present for 3 days.  She reports that she woke up in the middle the night with pain in her neck on both sides of her shoulders.  She feels tight in her shoulders and it causes discomfort when trying to turn her head left and right.  At home she has tried heat and ice without much relief.  Denies vision or headaches.  She continues to take her prescribed tramadol but does not find relief in her neck pain with this medication   Review of Systems See HPI   Past Medical History:  Diagnosis Date   Anemia 2005   Generally microcytic, transfusions in [address], 2012, 02/2015, 05/2015   Aortic stenosis    Moderate November 2017   Arthritis    Broken back 2013   Chronic back pain.    CAD (coronary artery disease)    Cardiac catheterization 2014 - 80% mid RCA and 70% OM managed medically   CHF (congestive heart failure) ([name])    Chronic bilateral pleural effusions    Chronic headache    Contrast media allergy    Diastolic heart failure ([name])    Diverticulosis    Esophageal cancer ([place]) 05/06/2015   Adenocarcinoma GE junction   Facial paresthesia    GERD (gastroesophageal  reflux disease)    H. pylori infection    H/O iron deficiency    05-06-15 iron infusion ([facility])   HH (hiatus hernia) 2008   Large with associated erosions   Hypertension    Paroxysmal atrial fibrillation ([name])    PONV (postoperative nausea and vomiting)    Pulmonary emboli ([place]) 2008, 2012   Stroke [facility]) 1982   Swallowing dysfunction     Social History   Socioeconomic History   Marital status: Widowed    Spouse name: Not on file   Number of children: 3   Years of education: 14   Highest education level: Not on file  Occupational History   Occupation: Retired  Tobacco Use   Smoking status: Never   Smokeless tobacco: Never  Vaping Use   Vaping Use: Never used  Substance and Sexual Activity   Alcohol use: No    Alcohol/week: 0.0 standard drinks   Drug use: No   Sexual activity: Not Currently  Other Topics Concern   Not on file  Social History Narrative   Born and raised in [place], [place]. Currently resides in a house with her son. 1 dog. Fun: go to church   Divorced(Has total of #3 children)-[place], [place], [place] Junes   Denies religious beliefs that would effect health care.    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[employer] Strain: Low Risk    Difficulty  of Paying Living Expenses: Not hard at all  Food Insecurity: No Food Insecurity   Worried About [place] in the Last Year: Never true   [place] in the Last Year: Never true  Transportation Needs: No Transportation Needs   Lack of Transportation (Medical): No   Lack of Transportation (Non-Medical): No  Physical Activity: Inactive   Days of Exercise per Week: 0 days   Minutes of Exercise per Session: 0 min  Stress: No Stress Concern Present   Feeling of Stress : Not at all  Social Connections: Moderately Integrated   Frequency of Communication with Friends and Family: More than  three times a week   Frequency of Social Gatherings with Friends and Family: More than three times a week   Attends Religious Services: More than 4 times per year   Active Member of [employer] or Organizations: Yes   Attends Archivist Meetings: More than 4 times per year   Marital Status: Widowed  Intimate Partner Violence: Not At Risk   Fear of Current or Ex-Partner: No   Emotionally Abused: No   Physically Abused: No   Sexually Abused: No    Past Surgical History:  Procedure Laterality Date   APPENDECTOMY  1950s   CARDIOVERSION N/A 07/08/2019   Procedure: CARDIOVERSION;  Surgeon: Pixie Casino, MD;  Location: [facility] ENDOSCOPY;  Service: Cardiovascular;  Laterality: N/A;   CARPAL TUNNEL RELEASE Bilateral [place]  2014   CHOLECYSTECTOMY  1980s   open   COLONOSCOPY N/A 02/17/2015   Procedure: COLONOSCOPY;  Surgeon: [name], MD;  Location: [facility] ENDOSCOPY;  Service: Endoscopy;  Laterality: N/A;   ESOPHAGOGASTRODUODENOSCOPY N/A 02/16/2015   Procedure: ESOPHAGOGASTRODUODENOSCOPY (EGD);  Surgeon: [name], MD;  Location: [facility] ENDOS[facility];  Service: Endoscopy;  Laterality: N/A;   ESOPHAGOGASTRODUODENOSCOPY N/A 05/06/2015   Procedure: ESOPHAGOGASTRODUODENOSCOPY (EGD);  Surgeon: [name], MD;  Location: [facility] ENDOSCOPY;  Service: Endoscopy;  Laterality: N/A;   EUS N/A 05/20/2015   Procedure: UPPER ENDOSCOPIC ULTRASOUND (EUS) LINEAR;  Surgeon: [name], MD;  Location: [facility] ENDOSCOPY;  Service: Endoscopy;  Laterality: N/A;   exploratory lab  1950s or [place] N/A 05/06/2015   Procedure: GIVENS CAPSULE STUDY;  Surgeon: [name], MD;  Location: [facility] ENDOSCOPY;  Service: Endoscopy;  Laterality: N/A;   KNEE ARTHROSCOPY WITH MEDIAL MENISECTOMY Left 04/18/2018   Procedure: LEFT KNEE ARTHROSCOPY WITH PARTIAL MEDIAL MENISECTOMY AND ANTERIOR CRUCIATE LIGAMENT DEBRIDEMENT;  Surgeon: [name], MD;  Location: [facility] ORS;  Service:  Orthopedics;  Laterality: Left;   LEFT HEART CATH AND CORONARY ANGIOGRAPHY N/A 11/08/2016   Procedure: Left Heart Cath and Coronary Angiography;  Surgeon: [name], MD;  Location: [place] CV LAB;  Service: Cardiovascular;  Laterality: N/A;   LEFT HEART CATH AND CORONARY ANGIOGRAPHY N/A 08/19/2019   Procedure: LEFT HEART CATH AND CORONARY ANGIOGRAPHY;  Surgeon: Martinique, Peter M, MD;  Location: [place] CV LAB;  Service: Cardiovascular;  Laterality: N/A;   lumbar back surgery  2012   [place]   TONSILLECTOMY AND ADENOIDECTOMY  1960s    Family History  Problem Relation Age of Onset   Stroke Mother    Heart disease Mother    Emphysema Father    Liver disease Sister    Ovarian cancer Sister    Stroke Sister    Other Child        died at  birth   Colon cancer Neg Hx    Esophageal cancer Neg Hx    Stomach cancer Neg Hx    Pancreatic cancer Neg Hx     Allergies  Allergen Reactions   Iodinated Contrast Media Anaphylaxis and Other (See Comments)    IPD dye Info given by patient   Iodine Anaphylaxis   Ioxaglate Anaphylaxis and Other (See Comments)   Lactose Intolerance (Gi) Anaphylaxis   Red Dye Anaphylaxis   Hydralazine Anxiety and Other (See Comments)    Facial flushing, pt prefers not to use it.    Milk-Related Compounds Other (See Comments)    Lactose intolerance Can tolerate milk if its cooked into the recipe, just can't drink milk    Propoxyphene Rash and Anxiety    Current Outpatient Medications on File Prior to Visit  Medication Sig Dispense Refill   acetaminophen (TYLENOL) 500 MG tablet Take 500 mg by mouth daily as needed for mild pain or moderate pain.      amLODipine (NORVASC) 5 MG tablet TAKE (1) TABLET BY MOUTH ONCE DAILY. 90 tablet 0   atorvastatin (LIPITOR) 80 MG tablet TAKE 1 TABLET BY MOUTH ONCE A DAY. 30 tablet 0   buPROPion (WELLBUTRIN XL) 150 MG 24 hr tablet TAKE (1) TABLET BY MOUTH ONCE DAILY. 30 tablet 0   carvedilol (COREG) 25  MG tablet TAKE (1) TABLET BY MOUTH TWICE DAILY WITH A MEAL. 60 tablet 0   cholecalciferol (VITAMIN D3) 25 MCG (1000 UNIT) tablet Take 1,000 Units by mouth daily.     ELIQUIS 5 MG TABS tablet TAKE (1) TABLET BY MOUTH TWICE DAILY. 60 tablet 0   furosemide (LASIX) 40 MG tablet TAKE 1 TABLET BY MOUTH ONCE DAILY. 30 tablet 0   meclizine (ANTIVERT) 25 MG tablet Take 1 tablet (25 mg total) by mouth as needed for dizziness or nausea. (Patient taking differently: Take 25 mg by mouth 3 (three) times daily as needed for dizziness or nausea.) 90 tablet 1   Melatonin 5 MG CAPS Take 10 mg by mouth at bedtime.     pantoprazole (PROTONIX) 40 MG tablet TAKE 1 TABLET BY MOUTH TWICE DAILY. 60 tablet 0   Pediatric Multiple Vitamins (FLINTSTONES MULTIVITAMIN PO) Take 1 tablet by mouth every evening.     potassium chloride (KLOR-CON) 10 MEQ tablet Take 1 tablet (10 mEq total) by mouth daily. 90 tablet 3   temazepam (RESTORIL) 15 MG capsule TAKE (1) CAPSULE BY MOUTH AT BEDTIME. 30 capsule 0   traMADol (ULTRAM) 50 MG tablet Take 1 tablet (50 mg total) by mouth every 8 (eight) hours. TAKE (1) TABLET BY MOUTH EVERY 8 HOURS. 90 tablet 2   vitamin C (ASCORBIC ACID) 500 MG tablet Take 500 mg by mouth daily.     No current facility-administered medications on file prior to visit.    BP (!) 110/58    Pulse 88    Temp 97.8 F (36.6 C) (Oral)    Ht 5\' 2"  (1.575 m)    Wt 92 lb (41.7 kg)    SpO2 96%    BMI 16.83 kg/m       Objective:   Physical Exam Vitals and nursing note reviewed.  Constitutional:      Appearance: Normal appearance.  Cardiovascular:     Rate and Rhythm: Normal rate and regular rhythm.     Pulses: Normal pulses.     Heart sounds: Normal heart sounds.  Pulmonary:     Effort: Pulmonary effort is normal.  Breath sounds: Normal breath sounds.  Musculoskeletal:        General: Tenderness (Tenderness with light palpation throughout bilateral trapezius muscle groups.  Has obvious decreased range of  motion with horizontal head movements.) present. No swelling or deformity. Normal range of motion.  Skin:    General: Skin is warm and dry.  Neurological:     Mental Status: She is alert.  Psychiatric:        Mood and Affect: Mood normal.        Behavior: Behavior normal.        Thought Content: Thought content normal.        Judgment: Judgment normal.      Assessment & Plan:  1. Neck and shoulder pain -Appears to be muscle strain.  Will prescribe prednisone and low-dose baclofen.  She was advised not to take tramadol or restoral when taking the baclofen for the next few days.  Follow-up if no improvement - predniSONE (DELTASONE) 10 MG tablet; Take 1 tablet (10 mg total) by mouth daily with breakfast.  Dispense: 7 tablet; Refill: 0 - Baclofen 5 MG TABS; Take 5 mg by mouth at bedtime as needed. DO NOT TAKE WITH TRAMADOL  Dispense: 5 tablet; Refill: 0  Dorothyann Peng, NP

## 2021-07-06 NOTE — Patient Instructions (Signed)
It was great seeing you today   I think you strained a muscle in your neck   I have sent in a muscle relaxer called Baclofen - take this at night and do not take Tramadol with it   I have also sent in a some steroids that you take once a day   Make sure you are using a heating pad

## 2021-07-19 ENCOUNTER — Other Ambulatory Visit: Payer: Self-pay | Admitting: Adult Health

## 2021-07-19 DIAGNOSIS — I10 Essential (primary) hypertension: Secondary | ICD-10-CM

## 2021-08-11 ENCOUNTER — Other Ambulatory Visit: Payer: Self-pay | Admitting: Adult Health

## 2021-08-22 ENCOUNTER — Telehealth: Payer: Self-pay

## 2021-08-22 ENCOUNTER — Ambulatory Visit: Payer: Medicare Other

## 2021-08-22 NOTE — Telephone Encounter (Signed)
Contacted patient on preferred number listed in notes for scheduled AWV. Patient stated she called and canceled this appointment and will call back to reschedule.

## 2021-08-22 NOTE — Telephone Encounter (Signed)
Contacted patient on preferred number listed in notes for scheduled AWV. Patient stated she called and canceled this appointment and will call to reschedule.

## 2021-09-05 ENCOUNTER — Other Ambulatory Visit: Payer: Self-pay | Admitting: Adult Health

## 2021-09-06 ENCOUNTER — Telehealth: Payer: Self-pay

## 2021-09-06 NOTE — Telephone Encounter (Signed)
Unsuccessful attempt to reach patient on preferred number listed in notes for scheduled AWV. Left message on voicemail okay to reschedule. 

## 2021-09-07 ENCOUNTER — Encounter: Payer: Self-pay | Admitting: Adult Health

## 2021-09-07 ENCOUNTER — Ambulatory Visit (INDEPENDENT_AMBULATORY_CARE_PROVIDER_SITE_OTHER): Payer: Medicare Other

## 2021-09-07 ENCOUNTER — Ambulatory Visit (INDEPENDENT_AMBULATORY_CARE_PROVIDER_SITE_OTHER): Payer: Medicare Other | Admitting: Adult Health

## 2021-09-07 ENCOUNTER — Other Ambulatory Visit: Payer: Self-pay

## 2021-09-07 VITALS — BP 112/66 | Temp 97.0°F | Ht 62.0 in | Wt 83.4 lb

## 2021-09-07 DIAGNOSIS — R1310 Dysphagia, unspecified: Secondary | ICD-10-CM | POA: Diagnosis not present

## 2021-09-07 DIAGNOSIS — R630 Anorexia: Secondary | ICD-10-CM | POA: Diagnosis not present

## 2021-09-07 DIAGNOSIS — R627 Adult failure to thrive: Secondary | ICD-10-CM | POA: Diagnosis not present

## 2021-09-07 DIAGNOSIS — R110 Nausea: Secondary | ICD-10-CM | POA: Diagnosis not present

## 2021-09-07 MED ORDER — ONDANSETRON HCL 4 MG PO TABS: 4.0000 mg | ORAL_TABLET | Freq: Three times a day (TID) | ORAL | 3 refills | Status: AC | PRN

## 2021-09-07 MED ORDER — MIRTAZAPINE 15 MG PO TBDP: 15.0000 mg | ORAL_TABLET | Freq: Every day | ORAL | 3 refills | Status: DC

## 2021-09-07 MED ORDER — TRAMADOL HCL 50 MG PO TABS
50.0000 mg | ORAL_TABLET | Freq: Three times a day (TID) | ORAL | 2 refills | Status: AC
Start: 2021-09-07 — End: ?

## 2021-09-07 NOTE — Patient Instructions (Signed)
It was great seeing you today  ? ?I am going to refer you to gastroenterology and order a swallow study  ? ?I have sent in a medication called Remeron to help stimulate your appetite  ? ?I have also sent in zofran to help with nausea ? ?Please stick with pureed foods for the time being. Thinks like baby foods and soft fruits  ?

## 2021-09-07 NOTE — Progress Notes (Signed)
Subjective:    Patient ID: [name], female    DOB: Dec 10, 1940, 81 y.o.   MRN: [id number]  HPI 81 year old female who  has a past medical history of Anemia (2005), Aortic stenosis, Arthritis, Broken back (2013), CAD (coronary artery disease), CHF (congestive heart failure) ([place]), Chronic bilateral pleural effusions, Chronic headache, Contrast media allergy, Diastolic heart failure ([place]), Diverticulosis, Esophageal cancer ([place]) (05/06/2015), Facial paresthesia, GERD (gastroesophageal reflux disease), H. pylori infection, H/O iron deficiency, HH (hiatus hernia) (2008), Hypertension, Paroxysmal atrial fibrillation ([place]), PONV (postoperative nausea and vomiting), Pulmonary emboli ([place]) (2008, 2012), Stroke [facility]) (1982), and Swallowing dysfunction.  She presents to the office today for follow-up of swallowing, nausea, dizziness, and weight loss.  He reports that over the last few months she has had worsening time with swallowing stating no matter what I eat or drink it feels like it gets caught in my throat and I choke.  Food and drink feel like it just stops."  She also reports loss of appetite, "nothing tastes good".   She also reports coughing up "a lot of mucus".  Has not had any vomiting but does stay nauseous.  Gets Meals on Wheels delivered to her house but finds it difficult to eat all the food, she only eats a soft food.  She denies fevers or chills  She does have a history of esophageal cancer in 2016  Wt Readings from Last 3 Encounters:  09/07/21 83 lb 6.4 oz (37.8 kg)  07/06/21 92 lb (41.7 kg)  05/03/21 95 lb (43.1 kg)    Review of Systems See HPI   Past Medical History:  Diagnosis Date   Anemia 2005   Generally microcytic, transfusions in [address], 2012, 02/2015, 05/2015   Aortic stenosis    Moderate November 2017   Arthritis    Broken back 2013   Chronic back pain.    CAD (coronary artery disease)    Cardiac catheterization 2014 - 80% mid RCA and 70% OM managed  medically   CHF (congestive heart failure) ([name])    Chronic bilateral pleural effusions    Chronic headache    Contrast media allergy    Diastolic heart failure ([name])    Diverticulosis    Esophageal cancer ([place]) 05/06/2015   Adenocarcinoma GE junction   Facial paresthesia    GERD (gastroesophageal reflux disease)    H. pylori infection    H/O iron deficiency    05-06-15 iron infusion ([facility])   HH (hiatus hernia) 2008   Large with associated erosions   Hypertension    Paroxysmal atrial fibrillation ([name])    PONV (postoperative nausea and vomiting)    Pulmonary emboli ([place]) 2008, 2012   Stroke [facility]) 1982   Swallowing dysfunction     Social History   Socioeconomic History   Marital status: Widowed    Spouse name: Not on file   Number of children: 3   Years of education: 14   Highest education level: Not on file  Occupational History   Occupation: Retired  Tobacco Use   Smoking status: Never   Smokeless tobacco: Never  Vaping Use   Vaping Use: Never used  Substance and Sexual Activity   Alcohol use: No    Alcohol/week: 0.0 standard drinks   Drug use: No   Sexual activity: Not Currently  Other Topics Concern   Not on file  Social History Narrative   Born and raised in [place], [place]. Currently resides in a house with her son. 1  dog. Fun: go to church   Divorced(Has total of #3 children)-[place], [place], [place] Jun[place]   Denies religious beliefs that would effect health care.    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[employer] Strain: Not on file  Food Insecurity: Not on file  Transportation Needs: Not on file  Physical Activity: Not on file  Stress: Not on file  Social Connections: Not on file  Intimate Partner Violence: Not on file    Past Surgical History:  Procedure Laterality Date   APPENDECTOMY  1950s   CARDIOVERSION N/A 07/08/2019    Procedure: CARDIOVERSION;  Surgeon: Pixie Casino, MD;  Location: [place];  Service: Cardiovascular;  Laterality: N/A;   [place]  2014   CHOLECYSTECTOMY  1980s   open   COLONOSCOPY N/A 02/17/2015   Procedure: COLONOSCOPY;  Surgeon: [name], MD;  [facility];  Service: Endoscopy;  Laterality: N/A;   ESOPHAGOGASTRODUODENOSCOPY N/A 02/16/2015   Procedure: ESOPHAGOGASTRODUODENOSCOPY (EGD);  Surgeon: [name], MD;  Location: [facility];  Service: Endoscopy;  Laterality: N/A;   ESOPHAGOGASTRODUODENOSCOPY N/A 05/06/2015   Procedure: ESOPHAGOGASTRODUODENOSCOPY (EGD);  Surgeon: [name], MD;  Location: [facility] ENDOSCOPY;  Service: Endoscopy;  Laterality: N/A;   EUS N/A 05/20/2015   Procedure: UPPER ENDOSCOPIC ULTRASOUND (EUS) LINEAR;  Surgeon: [name], MD;  [facility]OS[facility];  Service: Endoscopy;  Laterality: N/A;   exploratory lab  1950s or [place] N/A 05/06/2015   Procedure: GIVENS CAPSULE STUDY;  Surgeon: [name], MD;  Location: [facility] ENDOSCOPY;  Service: Endoscopy;  Laterality: N/A;   KNEE ARTHROSCOPY WITH MEDIAL MENISECTOMY Left 04/18/2018   Procedure: LEFT KNEE ARTHROSCOPY WITH PARTIAL MEDIAL MENISECTOMY AND ANTERIOR CRUCIATE LIGAMENT DEBRIDEMENT;  Surgeon: [name], MD;  [facility];  Service: Orthopedics;  Laterality: Left;   LEFT HEART CATH AND CORONARY ANGIOGRAPHY N/A 11/08/2016   Procedure: Left Heart Cath and Coronary Angiography;  Surgeon: [name], MD;  Location: [place] CV LAB;  Service: Cardiovascular;  Laterality: N/A;   LEFT HEART CATH AND CORONARY ANGIOGRAPHY N/A 08/19/2019   Procedure: LEFT HEART CATH AND CORONARY ANGIOGRAPHY;  Surgeon: Martinique, Peter M, MD;  Location: [place] CV LAB;  Service: Cardiovascular;  Laterality: N/A;   lumbar back surgery  2012   [facility]   TONSILLECTOMY AND ADENOIDECTOMY  1960s    Family  History  Problem Relation Age of Onset   Stroke Mother    Heart disease Mother    Emphysema Father    Liver disease Sister    Ovarian cancer Sister    Stroke Sister    Other Child        died at birth   Colon cancer Neg Hx    Esophageal cancer Neg Hx    Stomach cancer Neg Hx    Pancreatic cancer Neg Hx     Allergies  Allergen Reactions   Iodinated Contrast Media Anaphylaxis and Other (See Comments)    IPD dye Info given by patient   Iodine Anaphylaxis   Ioxaglate Anaphylaxis and Other (See Comments)   Lactose Intolerance (Gi) Anaphylaxis   Red Dye Anaphylaxis   Hydralazine Anxiety and Other (See Comments)    Facial flushing, pt prefers not to use it.    Milk-Related Compounds Other (See Comments)  Lactose intolerance Can tolerate milk if its cooked into the recipe, just can't drink milk    Propoxyphene Rash and Anxiety    Current Outpatient Medications on File Prior to Visit  Medication Sig Dispense Refill   acetaminophen (TYLENOL) 500 MG tablet Take 500 mg by mouth daily as needed for mild pain or moderate pain.      amLODipine (NORVASC) 5 MG tablet TAKE (1) TABLET BY MOUTH ONCE DAILY. 90 tablet 0   atorvastatin (LIPITOR) 80 MG tablet Take 1 tablet (80 mg total) by mouth daily. 90 tablet 1   Baclofen 5 MG TABS Take 5 mg by mouth at bedtime as needed. DO NOT TAKE WITH TRAMADOL 5 tablet 0   buPROPion (WELLBUTRIN XL) 150 MG 24 hr tablet TAKE (1) TABLET BY MOUTH ONCE DAILY. 90 tablet 1   carvedilol (COREG) 25 MG tablet Take 1 tablet (25 mg total) by mouth 2 (two) times daily with a meal. 180 tablet 1   cholecalciferol (VITAMIN D3) 25 MCG (1000 UNIT) tablet Take 1,000 Units by mouth daily.     ELIQUIS 5 MG TABS tablet TAKE (1) TABLET BY MOUTH TWICE DAILY. 60 tablet 0   furosemide (LASIX) 40 MG tablet TAKE 1 TABLET BY MOUTH ONCE DAILY. 90 tablet 1   meclizine (ANTIVERT) 25 MG tablet Take 1 tablet (25 mg total) by mouth as needed for dizziness or nausea. (Patient taking  differently: Take 25 mg by mouth 3 (three) times daily as needed for dizziness or nausea.) 90 tablet 1   Melatonin 5 MG CAPS Take 10 mg by mouth at bedtime.     pantoprazole (PROTONIX) 40 MG tablet Take 1 tablet (40 mg total) by mouth 2 (two) times daily. 180 tablet 1   Pediatric Multiple Vitamins (FLINTSTONES MULTIVITAMIN PO) Take 1 tablet by mouth every evening.     predniSONE (DELTASONE) 10 MG tablet Take 1 tablet (10 mg total) by mouth daily with breakfast. 7 tablet 0   temazepam (RESTORIL) 15 MG capsule TAKE (1) CAPSULE BY MOUTH AT BEDTIME. 30 capsule 0   vitamin C (ASCORBIC ACID) 500 MG tablet Take 500 mg by mouth daily.     potassium chloride (KLOR-CON) 10 MEQ tablet Take 1 tablet (10 mEq total) by mouth daily. 90 tablet 3   No current facility-administered medications on file prior to visit.    BP 112/66    Temp (!) 97 F (36.1 C) (Tympanic)    Ht '5\' 2"'$  (1.575 m)    Wt 83 lb 6.4 oz (37.8 kg)    BMI 15.25 kg/m   BP Readings from Last 3 Encounters:  09/07/21 112/66  07/06/21 (!) 110/58  05/03/21 108/80       Objective:   Physical Exam Vitals and nursing note reviewed.  Constitutional:      Appearance: Normal appearance. She is cachectic. She is ill-appearing.  Cardiovascular:     Rate and Rhythm: Normal rate and regular rhythm.     Pulses: Normal pulses.     Heart sounds: Normal heart sounds.  Pulmonary:     Effort: Pulmonary effort is normal.     Breath sounds: Normal breath sounds.  Abdominal:     General: Abdomen is flat. Bowel sounds are normal. There is no distension.     Palpations: Abdomen is soft.     Tenderness: There is no abdominal tenderness.  Musculoskeletal:        General: Normal range of motion.  Skin:    General: Skin is warm and  dry.  Neurological:     General: No focal deficit present.     Mental Status: She is alert and oriented to person, place, and time.  Psychiatric:        Mood and Affect: Mood normal.        Behavior: Behavior normal.         Thought Content: Thought content normal.        Judgment: Judgment normal.      Assessment & Plan:  1. Loss of appetite -Start on Remeron.  She was advised that this will make her sleepy so she no longer needs to take restoral as needed to help with her sleep issues.  Hopefully this increases her appetite.  Advised pured diet and to hold chin to chest when swallowing.  Make sure she is eating small portions. - mirtazapine (REMERON SOL-TAB) 15 MG disintegrating tablet; Take 1 tablet (15 mg total) by mouth at bedtime.  Dispense: 30 tablet; Refill: 3 - Amb Referral to Palliative Care - CBC with Differential/Platelet; Future - DG Chest 2 View; Future - Comprehensive metabolic panel; Future - CBC with Differential/Platelet - Comprehensive metabolic panel  2. Dysphagia, unspecified type  - Ambulatory referral to Gastroenterology - SLP modified barium swallow; Future - Amb Referral to Palliative Care  3. Nausea  - ondansetron (ZOFRAN) 4 MG tablet; Take 1 tablet (4 mg total) by mouth every 8 (eight) hours as needed for nausea or vomiting.  Dispense: 20 tablet; Refill: 3  4. Failure to thrive in adult  - Amb Referral to [place], NP

## 2021-09-08 ENCOUNTER — Other Ambulatory Visit (HOSPITAL_COMMUNITY): Payer: Self-pay

## 2021-09-08 ENCOUNTER — Other Ambulatory Visit: Payer: Self-pay | Admitting: Adult Health

## 2021-09-08 ENCOUNTER — Telehealth (HOSPITAL_COMMUNITY): Payer: Self-pay

## 2021-09-08 DIAGNOSIS — R1310 Dysphagia, unspecified: Secondary | ICD-10-CM

## 2021-09-08 DIAGNOSIS — M25519 Pain in unspecified shoulder: Secondary | ICD-10-CM

## 2021-09-08 LAB — CBC WITH DIFFERENTIAL/PLATELET
Basophils Absolute: 0.1 10*3/uL (ref 0.0–0.1)
Basophils Relative: 0.7 % (ref 0.0–3.0)
Eosinophils Absolute: 0.1 10*3/uL (ref 0.0–0.7)
Eosinophils Relative: 0.7 % (ref 0.0–5.0)
HCT: 39.6 % (ref 36.0–46.0)
Hemoglobin: 13.5 g/dL (ref 12.0–15.0)
Lymphocytes Relative: 18.4 % (ref 12.0–46.0)
Lymphs Abs: 1.5 10*3/uL (ref 0.7–4.0)
MCHC: 34.2 g/dL (ref 30.0–36.0)
MCV: 85.2 fl (ref 78.0–100.0)
Monocytes Absolute: 0.7 10*3/uL (ref 0.1–1.0)
Monocytes Relative: 8.2 % (ref 3.0–12.0)
Neutro Abs: 5.8 10*3/uL (ref 1.4–7.7)
Neutrophils Relative %: 72 % (ref 43.0–77.0)
Platelets: 138 10*3/uL — ABNORMAL LOW (ref 150.0–400.0)
RBC: 4.65 Mil/uL (ref 3.87–5.11)
RDW: 13.3 % (ref 11.5–15.5)
WBC: 8.1 10*3/uL (ref 4.0–10.5)

## 2021-09-08 LAB — COMPREHENSIVE METABOLIC PANEL
ALT: 14 U/L (ref 0–35)
AST: 22 U/L (ref 0–37)
Albumin: 4.1 g/dL (ref 3.5–5.2)
Alkaline Phosphatase: 91 U/L (ref 39–117)
BUN: 17 mg/dL (ref 6–23)
CO2: 31 mEq/L (ref 19–32)
Calcium: 9.3 mg/dL (ref 8.4–10.5)
Chloride: 94 mEq/L — ABNORMAL LOW (ref 96–112)
Creatinine, Ser: 1.75 mg/dL — ABNORMAL HIGH (ref 0.40–1.20)
GFR: 26.89 mL/min — ABNORMAL LOW (ref >60.00)
Glucose, Bld: 146 mg/dL — ABNORMAL HIGH (ref 70–99)
Potassium: 3.5 mEq/L (ref 3.5–5.1)
Sodium: 138 mEq/L (ref 135–145)
Total Bilirubin: 1 mg/dL (ref 0.2–1.2)
Total Protein: 6.9 g/dL (ref 6.0–8.3)

## 2021-09-08 MED ORDER — BACLOFEN 5 MG PO TABS: 5.0000 mg | ORAL_TABLET | Freq: Every evening | ORAL | 0 refills | Status: DC | PRN

## 2021-09-08 NOTE — Telephone Encounter (Signed)
Attempted to contact patient to schedule OP MBS - left voicemail. ?

## 2021-09-12 ENCOUNTER — Telehealth: Payer: Self-pay | Admitting: Adult Health

## 2021-09-12 NOTE — Telephone Encounter (Signed)
Left message for patient to call back and schedule Medicare Annual Wellness Visit (AWV) either virtually or in office. Left  my Herbie Drape number [phone] ? ? ?Last AWV 08/19/20 ? please schedule at anytime with [facility] Nurse Health Advisor 1 or 2 ? ? ?This should be a 45 minute visit.  ?

## 2021-09-16 ENCOUNTER — Telehealth: Payer: Self-pay | Admitting: Nurse Practitioner

## 2021-09-16 NOTE — Telephone Encounter (Signed)
Attempted to contact patient to schedule Palliative Consult, no answer went straight to VM - left message with reason for call along with my name and call back number requesting return call.  I also sent a MyChart message requesting a call to schedule.  ?

## 2021-09-20 ENCOUNTER — Ambulatory Visit (INDEPENDENT_AMBULATORY_CARE_PROVIDER_SITE_OTHER): Payer: Medicare Other

## 2021-09-20 ENCOUNTER — Other Ambulatory Visit: Payer: Self-pay | Admitting: Adult Health

## 2021-09-20 VITALS — Ht 62.0 in | Wt 78.0 lb

## 2021-09-20 DIAGNOSIS — Z0000 Encounter for general adult medical examination without abnormal findings: Secondary | ICD-10-CM

## 2021-09-20 DIAGNOSIS — R2681 Unsteadiness on feet: Secondary | ICD-10-CM

## 2021-09-20 DIAGNOSIS — R627 Adult failure to thrive: Secondary | ICD-10-CM

## 2021-09-20 NOTE — Progress Notes (Signed)
Order for shower chair d/t gait instability  ?

## 2021-09-20 NOTE — Progress Notes (Signed)
?I connected with Bren Borys today by telephone and verified that I am speaking with the correct person using two identifiers. ?Location patient: home ?Location provider: work ?Persons participating in the virtual visit: Meika, Earll LPN. ?  ?I discussed the limitations, risks, security and privacy concerns of performing an evaluation and management service by telephone and the availability of in person appointments. I also discussed with the patient that there may be a patient responsible charge related to this service. The patient expressed understanding and verbally consented to this telephonic visit.  ?  ?Interactive audio and video telecommunications were attempted between this provider and patient, however failed, due to patient having technical difficulties OR patient did not have access to video capability.  We continued and completed visit with audio only. ? ?  ? ?Vital signs may be patient reported or missing. ? ?Subjective:  ? [name] is a 81 y.o. female who presents for Medicare Annual (Subsequent) preventive examination. ? ?Review of Systems    ? ?Cardiac Risk Factors include: advanced age (>90mn, >>53women);hypertension;dyslipidemia ? ?   ?Objective:  ?  ?Today's Vitals  ? 09/20/21 1211 09/20/21 1212  ?Weight: 78 lb (35.4 kg)   ?Height: '5\' 2"'$  (1.575 m)   ?PainSc:  6   ? ?Body mass index is 14.27 kg/m?. ? ?Advanced Directives 09/20/2021 04/24/2021 11/28/2020 08/19/2020 08/19/2019 07/08/2019 03/31/2019  ?Does Patient Have a Medical Advance Directive? No No No No No No No  ?Type of Advance Directive - - - - - - -  ?Does patient want to make changes to medical advance directive? - - - - - - -  ?Copy of [place] Chart? - - - - - - -  ?Would patient like information on creating a medical advance directive? Yes (MAU/Ambulatory/Procedural Areas - Information given) No - Patient declined No - Patient declined No - Patient declined No - Patient declined Yes  (MAU/Ambulatory/Procedural Areas - Information given) No - Patient declined  ? ? ?Current Medications (verified) ?Outpatient Encounter Medications as of 09/20/2021  ?Medication Sig  ? acetaminophen (TYLENOL) 500 MG tablet Take 500 mg by mouth daily as needed for mild pain or moderate pain.   ? amLODipine (NORVASC) 5 MG tablet TAKE (1) TABLET BY MOUTH ONCE DAILY.  ? atorvastatin (LIPITOR) 80 MG tablet Take 1 tablet (80 mg total) by mouth daily.  ? Baclofen 5 MG TABS Take 5 mg by mouth at bedtime as needed. DO NOT TAKE WITH TRAMADOL  ? buPROPion (WELLBUTRIN XL) 150 MG 24 hr tablet TAKE (1) TABLET BY MOUTH ONCE DAILY.  ? carvedilol (COREG) 25 MG tablet Take 1 tablet (25 mg total) by mouth 2 (two) times daily with a meal.  ? cholecalciferol (VITAMIN D3) 25 MCG (1000 UNIT) tablet Take 1,000 Units by mouth daily.  ? ELIQUIS 5 MG TABS tablet TAKE (1) TABLET BY MOUTH TWICE DAILY.  ? furosemide (LASIX) 40 MG tablet TAKE 1 TABLET BY MOUTH ONCE DAILY.  ? Melatonin 5 MG CAPS Take 10 mg by mouth at bedtime.  ? mirtazapine (REMERON SOL-TAB) 15 MG disintegrating tablet Take 1 tablet (15 mg total) by mouth at bedtime.  ? ondansetron (ZOFRAN) 4 MG tablet Take 1 tablet (4 mg total) by mouth every 8 (eight) hours as needed for nausea or vomiting.  ? pantoprazole (PROTONIX) 40 MG tablet Take 1 tablet (40 mg total) by mouth 2 (two) times daily.  ? Pediatric Multiple Vitamins (FLINTSTONES MULTIVITAMIN PO) Take 1 tablet by  mouth every evening.  ? temazepam (RESTORIL) 15 MG capsule TAKE (1) CAPSULE BY MOUTH AT BEDTIME.  ? traMADol (ULTRAM) 50 MG tablet Take 1 tablet (50 mg total) by mouth every 8 (eight) hours. TAKE (1) TABLET BY MOUTH EVERY 8 HOURS.  ? vitamin C (ASCORBIC ACID) 500 MG tablet Take 500 mg by mouth daily.  ? meclizine (ANTIVERT) 25 MG tablet Take 1 tablet (25 mg total) by mouth as needed for dizziness or nausea. (Patient not taking: Reported on 09/20/2021)  ? potassium chloride (KLOR-CON) 10 MEQ tablet Take 1 tablet (10 mEq  total) by mouth daily.  ? ?No facility-administered encounter medications on file as of 09/20/2021.  ? ? ?Allergies (verified) ?Iodinated contrast media, Iodine, Ioxaglate, Lactose intolerance (gi), Red dye, Hydralazine, Milk-related compounds, and Propoxyphene  ? ?History: ?Past Medical History:  ?Diagnosis Date  ? Anemia 2005  ? Generally microcytic, transfusions in [address], 2012, 02/2015, 05/2015  ? Aortic stenosis   ? Moderate November 2017  ? Arthritis   ? Broken back 2013  ? Chronic back pain.   ? CAD (coronary artery disease)   ? Cardiac catheterization 2014 - 80% mid RCA and 70% OM managed medically  ? CHF (congestive heart failure) ([place])   ? Chronic bilateral pleural effusions   ? Chronic headache   ? Contrast media allergy   ? Diastolic heart failure ([place])   ? Diverticulosis   ? Esophageal cancer ([place]) 05/06/2015  ? Adenocarcinoma GE junction  ? Facial paresthesia   ? GERD (gastroesophageal reflux disease)   ? H. pylori infection   ? H/O iron deficiency   ? 05-06-15 iron infusion ([facility])  ? HH (hiatus hernia) 2008  ? Large with associated erosions  ? Hypertension   ? Paroxysmal atrial fibrillation ([name])   ? PONV (postoperative nausea and vomiting)   ? Pulmonary emboli ([place]) 2008, 2012  ? Stroke [facility]) 1982  ? Swallowing dysfunction   ? ?Past Surgical History:  ?Procedure Laterality Date  ? APPENDECTOMY  1950s  ? CARDIOVERSION N/A 07/08/2019  ? Procedure: CARDIOVERSION;  Surgeon: Pixie Casino, MD;  Location: [facility] ENDOSCOPY;  Service: Cardiovascular;  Laterality: N/A;  ? CARPAL TUNNEL RELEASE Bilateral 1990s  ? [place] SURGERY  2014  ? CHOLECYSTECTOMY  1980s  ? open  ? COLONOSCOPY N/A 02/17/2015  ? Procedure: COLONOSCOPY;  Surgeon: [name], MD;  [facility];  Service: Endoscopy;  Laterality: N/A;  ? ESOPHAGOGASTRODUODENOSCOPY N/A 02/16/2015  ? Procedure: ESOPHAGOGASTRODUODENOSCOPY (EGD);  Surgeon: [name], MD;  Location[facility];  Service: Endoscopy;  Laterality: N/A;  ?  ESOPHAGOGASTRODUODENOSCOPY N/A 05/06/2015  ? Procedure: ESOPHAGOGASTRODUODENOSCOPY (EGD);  Surgeon: [name], MD;  Location: [facility] ENDOSCOPY;  Service: Endoscopy;  Laterality: N/A;  ? EUS N/A 05/20/2015  ? Procedure: UPPER ENDOSCOPIC ULTRASOUND (EUS) LINEAR;  Surgeon: [name], MD;  [facility];  Service: Endoscopy;  Laterality: N/A;  ? exploratory lab  1950s or 1960s  ? GIVENS CAPSULE STUDY N/A 05/06/2015  ? Procedure: GIVENS CAPSULE STUDY;  Surgeon: [name], MD;  Location: [facility] ENDOSCOPY;  Service: Endoscopy;  Laterality: N/A;  ? KNEE ARTHROSCOPY WITH MEDIAL MENISECTOMY Left 04/18/2018  ? Procedure: LEFT KNEE ARTHROSCOPY WITH PARTIAL MEDIAL MENISECTOMY AND ANTERIOR CRUCIATE LIGAMENT DEBRIDEMENT;  Surgeon: [name], MD;  [facility];  Service: Orthopedics;  Laterality: Left;  ? LEFT HEART CATH AND CORONARY ANGIOGRAPHY N/A 11/08/2016  ? Procedure: Left Heart Cath and Coronary Angiography;  Surgeon: [name], MD;  Location: [place] CV LAB;  Service: Cardiovascular;  Laterality: N/A;  ? LEFT HEART CATH AND CORONARY ANGIOGRAPHY N/A 08/19/2019  ? Procedure: LEFT HEART CATH AND CORONARY ANGIOGRAPHY;  Surgeon: Martinique, Peter M, MD;  Location: [place] CV LAB;  Service: Cardiovascular;  Laterality: N/A;  ? lumbar back surgery  2012  ? [place] SURGERY  1991  ? TONSILLECTOMY AND ADENOIDECTOMY  1960s  ? ?Family History  ?Problem Relation Age of Onset  ? Stroke Mother   ? Heart disease Mother   ? Emphysema Father   ? Liver disease Sister   ? Ovarian cancer Sister   ? Stroke Sister   ? Other Child   ?     died at birth  ? Colon cancer Neg Hx   ? Esophageal cancer Neg Hx   ? Stomach cancer Neg Hx   ? Pancreatic cancer Neg Hx   ? ?Social History  ? ?Socioeconomic History  ? Marital status: Widowed  ?  Spouse name: Not on file  ? Number of children: 3  ? Years of education: 76  ? Highest education level: Not on file  ?Occupational History  ? Occupation: Retired  ?Tobacco Use  ?  Smoking status: Never  ? Smokeless tobacco: Never  ?Vaping Use  ? Vaping Use: Never used  ?Substance and Sexual Activity  ? Alcohol use: No  ?  Alcohol/week: 0.0 standard drinks  ? Drug use: No  ? Sexual activity: Not Curr

## 2021-09-20 NOTE — Patient Instructions (Signed)
Ms. Sandberg , ?Thank you for taking time to come for your Medicare Wellness Visit. I appreciate your ongoing commitment to your health goals. Please review the following plan we discussed and let me know if I can assist you in the future.  ? ?Screening recommendations/referrals: ?Colonoscopy: not required ?Mammogram: not required ?Bone Density: completed 02/04/2014 ?Recommended yearly ophthalmology/optometry visit for glaucoma screening and checkup ?Recommended yearly dental visit for hygiene and checkup ? ?Vaccinations: ?Influenza vaccine: decline ?Pneumococcal vaccine: decline ?Tdap vaccine: completed 01/28/2014, due 01/29/2024 ?Shingles vaccine: discussed   ?Covid-19: 10/29/2019, 10/01/2019 ? ?Advanced directives: mailed a copy ? ?Conditions/risks identified: none ? ?Next appointment: Follow up in one year for your annual wellness visit  ? ? ?Preventive Care 32 Years and Older, Female ?Preventive care refers to lifestyle choices and visits with your health care provider that can promote health and wellness. ?What does preventive care include? ?A yearly physical exam. This is also called an annual well check. ?Dental exams once or twice a year. ?Routine eye exams. Ask your health care provider how often you should have your eyes checked. ?Personal lifestyle choices, including: ?Daily care of your teeth and gums. ?Regular physical activity. ?Eating a healthy diet. ?Avoiding tobacco and drug use. ?Limiting alcohol use. ?Practicing safe sex. ?Taking low-dose aspirin every day. ?Taking vitamin and mineral supplements as recommended by your health care provider. ?What happens during an annual well check? ?The services and screenings done by your health care provider during your annual well check will depend on your age, overall health, lifestyle risk factors, and family history of disease. ?Counseling  ?Your health care provider may ask you questions about your: ?Alcohol use. ?Tobacco use. ?Drug use. ?Emotional  well-being. ?Home and relationship well-being. ?Sexual activity. ?Eating habits. ?History of falls. ?Memory and ability to understand (cognition). ?Work and work Statistician. ?Reproductive health. ?Screening  ?You may have the following tests or measurements: ?Height, weight, and BMI. ?Blood pressure. ?Lipid and cholesterol levels. These may be checked every 5 years, or more frequently if you are over 50 years old. ?Skin check. ?Lung cancer screening. You may have this screening every year starting at age 71 if you have a 30-pack-year history of smoking and currently smoke or have quit within the past 15 years. ?Fecal occult blood test (FOBT) of the stool. You may have this test every year starting at age 72. ?Flexible sigmoidoscopy or colonoscopy. You may have a sigmoidoscopy every 5 years or a colonoscopy every 10 years starting at age 71. ?Hepatitis C blood test. ?Hepatitis B blood test. ?Sexually transmitted disease (STD) testing. ?Diabetes screening. This is done by checking your blood sugar (glucose) after you have not eaten for a while (fasting). You may have this done every 1-3 years. ?Bone density scan. This is done to screen for osteoporosis. You may have this done starting at age 35. ?Mammogram. This may be done every 1-2 years. Talk to your health care provider about how often you should have regular mammograms. ?Talk with your health care provider about your test results, treatment options, and if necessary, the need for more tests. ?Vaccines  ?Your health care provider may recommend certain vaccines, such as: ?Influenza vaccine. This is recommended every year. ?Tetanus, diphtheria, and acellular pertussis (Tdap, Td) vaccine. You may need a Td booster every 10 years. ?Zoster vaccine. You may need this after age 41. ?Pneumococcal 13-valent conjugate (PCV13) vaccine. One dose is recommended after age 72. ?Pneumococcal polysaccharide (PPSV23) vaccine. One dose is recommended after age 31. ?Talk to  your  health care provider about which screenings and vaccines you need and how often you need them. ?This information is not intended to replace advice given to you by your health care provider. Make sure you discuss any questions you have with your health care provider. ?Document Released: 07/16/2015 Document Revised: 03/08/2016 Document Reviewed: 04/20/2015 ?Elsevier Interactive Patient Education ? 2017 [place]. ? ?Fall Prevention in the Home ?Falls can cause injuries. They can happen to people of all ages. There are many things you can do to make your home safe and to help prevent falls. ?What can I do on the outside of my home? ?Regularly fix the edges of walkways and driveways and fix any cracks. ?Remove anything that might make you trip as you walk through a door, such as a raised step or threshold. ?Trim any bushes or trees on the path to your home. ?Use bright outdoor lighting. ?Clear any walking paths of anything that might make someone trip, such as rocks or tools. ?Regularly check to see if handrails are loose or broken. Make sure that both sides of any steps have handrails. ?Any raised decks and porches should have guardrails on the edges. ?Have any leaves, snow, or ice cleared regularly. ?Use sand or salt on walking paths during winter. ?Clean up any spills in your garage right away. This includes oil or grease spills. ?What can I do in the bathroom? ?Use night lights. ?Install grab bars by the toilet and in the tub and shower. Do not use towel bars as grab bars. ?Use non-skid mats or decals in the tub or shower. ?If you need to sit down in the shower, use a plastic, non-slip stool. ?Keep the floor dry. Clean up any water that spills on the floor as soon as it happens. ?Remove soap buildup in the tub or shower regularly. ?Attach bath mats securely with double-sided non-slip rug tape. ?Do not have throw rugs and other things on the floor that can make you trip. ?What can I do in the bedroom? ?Use night  lights. ?Make sure that you have a light by your bed that is easy to reach. ?Do not use any sheets or blankets that are too big for your bed. They should not hang down onto the floor. ?Have a firm chair that has side arms. You can use this for support while you get dressed. ?Do not have throw rugs and other things on the floor that can make you trip. ?What can I do in the kitchen? ?Clean up any spills right away. ?Avoid walking on wet floors. ?Keep items that you use a lot in easy-to-reach places. ?If you need to reach something above you, use a strong step stool that has a grab bar. ?Keep electrical cords out of the way. ?Do not use floor polish or wax that makes floors slippery. If you must use wax, use non-skid floor wax. ?Do not have throw rugs and other things on the floor that can make you trip. ?What can I do with my stairs? ?Do not leave any items on the stairs. ?Make sure that there are handrails on both sides of the stairs and use them. Fix handrails that are broken or loose. Make sure that handrails are as long as the stairways. ?Check any carpeting to make sure that it is firmly attached to the stairs. Fix any carpet that is loose or worn. ?Avoid having throw rugs at the top or bottom of the stairs. If you do have throw rugs, attach  them to the floor with carpet tape. ?Make sure that you have a light switch at the top of the stairs and the bottom of the stairs. If you do not have them, ask someone to add them for you. ?What else can I do to help prevent falls? ?Wear shoes that: ?Do not have high heels. ?Have rubber bottoms. ?Are comfortable and fit you well. ?Are closed at the toe. Do not wear sandals. ?If you use a stepladder: ?Make sure that it is fully opened. Do not climb a closed stepladder. ?Make sure that both sides of the stepladder are locked into place. ?Ask someone to hold it for you, if possible. ?Clearly mark and make sure that you can see: ?Any grab bars or handrails. ?First and last  steps. ?Where the edge of each step is. ?Use tools that help you move around (mobility aids) if they are needed. These include: ?Canes. ?Walkers. ?Scooters. ?Crutches. ?Turn on the lights when you go into a dark area. Replac

## 2021-09-21 ENCOUNTER — Ambulatory Visit (HOSPITAL_COMMUNITY)
Admission: RE | Admit: 2021-09-21 | Discharge: 2021-09-21 | Disposition: A | Discharge status: Home / Self Care | Payer: Medicare Other | Source: Ambulatory Visit | Attending: Adult Health | Admitting: Adult Health

## 2021-09-21 ENCOUNTER — Telehealth: Payer: Self-pay | Admitting: *Deleted

## 2021-09-21 ENCOUNTER — Other Ambulatory Visit: Payer: Self-pay

## 2021-09-21 DIAGNOSIS — R1310 Dysphagia, unspecified: Secondary | ICD-10-CM | POA: Diagnosis not present

## 2021-09-21 NOTE — Progress Notes (Signed)
Modified Barium Swallow Progress Note ? ?Patient Details  ?Name: [name] ?MRN: [id number] ?Date of Birth: [birth year] ? ?Today's Date: 09/21/2021 ? ?Modified Barium Swallow completed.  Full report located under Chart Review in the Imaging Section. ? ?Brief recommendations include the following: ? ?Clinical Impression ? Pt presents with oropharyngeal dysphagia characterized by impaired posterior bolus propulsion, reduced tongue base retraction, reduced anterior laryngeal movement, reduced pharyngeal constriction, and velopharyngeal dysfunction. Lingual pumping was demonstrated and 4-5 attempts were often necessary for A-P transport. She demonstrated posterior pharyngeal wall residue, vallecular residue, pyriform sinus residue, intermittently incomplete epiglottic inversion, and inconsistently inadequate velopharyngeal closure. Pharyngeal residue was improved, but not eliminated, with secondary swallows. She demonstrated aspiration (PAS 7) of thin liquids via straw when consecutive swallows were used and penetration (PAS 5) was noted with individual swallows via straw. A chin tuck posture was noted to facilitate penetration and subsequent aspiration of liquids in the pyriform sinuses. Epiglottic inversion worsened as the study progressed and SLP questions the impact of fatigue. Aspiration consistently resulted in coughing which was effective in expelling penetrant, but not aspirate. Esophageal sweep revealed stasis of barium within the esophagus.  It is recommended that the pt's current diet of puree solids and thin liquids via cup be continued. Dysphagia treatment is clinically indicated at this time via outpatient or home health. SLP reviewed pt's MBS video from October, 2020 and completed side-by-side comparision of some consistencies. Overall, pt's oropharyngeal swallow function does not appear to be so significantly worse to warrant the severity of her worsening symptoms and weight loss.Considering pt's c/o  burning, frequent regurgitation, the sensation of abdominal tightness, and observance of esophageal stasis, SLP is in agreement with PCP's referral to GI. ?  ?Swallow Evaluation Recommendations ? ? Recommended Consults: Consider GI evaluation;Consider esophageal assessment, SLP services via home health or outpatient ? ? SLP Diet Recommendations: Dysphagia 1 (Puree) solids;Thin liquid ? ? Liquid Administration via: Cup;No straw ? ? Medication Administration: Whole meds with puree ? ? Supervision: Patient able to self feed ? ? Compensations: Slow rate;Small sips/bites;Multiple dry swallows after each bite/sip;Follow solids with liquid ? ? Postural Changes: Seated upright at 90 degrees ? ? Oral Care Recommendations: Oral care BID ? ?   ?Aedyn Kempfer I. Hardin Negus, [place], CCC-SLP ?Acute Rehabilitation Services ?Office number [phone] ?Pager [phone] ? ? ? ?Horton Marshall ?09/21/2021,4:11 PM ? ?

## 2021-09-21 NOTE — Telephone Encounter (Signed)
? ?  Telephone encounter was:  Unsuccessful.  09/21/2021 ?Name: [name] MRN: [id number] DOB: [birth year] ? ?Unsuccessful outbound call made today to assist with:  Transportation Needs  ? ?Outreach Attempt:  1st Attempt ? ?A HIPAA compliant voice message was left requesting a return call.  Instructed patient to call back at   Instructed patient to call back at [phone]  at their earliest convenience. . ? ?Gable Odonohue Greenauer -Selinda Eon ?Care Guide , Embedded Care Coordination ?[place], Care Management  ?[phone] ?[address] , [place] [address] ?Email : [email or web address]. [email or web address] ?  ?

## 2021-09-27 ENCOUNTER — Telehealth: Payer: Self-pay | Admitting: *Deleted

## 2021-09-27 NOTE — Telephone Encounter (Signed)
? ?  Telephone encounter was:  Unsuccessful.  09/27/2021 ?Name: [name] MRN: [id number] DOB: 1940-10-04 ? ?Unsuccessful outbound call made today to assist with:  Transportation Needs  ? ?Outreach Attempt:  2nd Attempt ? ?A HIPAA compliant voice message was left requesting a return call.  Instructed patient to call back at   Instructed patient to call back at [phone]  at their earliest convenience. . ? ?Bonnie Overdorf Greenauer -Selinda Eon ?Care Guide , Embedded Care Coordination ?[facility], Care Management  ?[phone] ?[address] , [place] [address] ?Email : [email or web address] ?  ? ? ?

## 2021-09-28 ENCOUNTER — Ambulatory Visit: Payer: Medicare Other | Admitting: Physician Assistant

## 2021-09-28 ENCOUNTER — Encounter: Payer: Self-pay | Admitting: Physician Assistant

## 2021-09-28 ENCOUNTER — Other Ambulatory Visit (INDEPENDENT_AMBULATORY_CARE_PROVIDER_SITE_OTHER): Payer: Medicare Other

## 2021-09-28 VITALS — BP 110/60 | HR 82 | Ht 62.0 in | Wt 77.0 lb

## 2021-09-28 DIAGNOSIS — Z8501 Personal history of malignant neoplasm of esophagus: Secondary | ICD-10-CM | POA: Diagnosis not present

## 2021-09-28 DIAGNOSIS — R634 Abnormal weight loss: Secondary | ICD-10-CM

## 2021-09-28 DIAGNOSIS — R1010 Upper abdominal pain, unspecified: Secondary | ICD-10-CM

## 2021-09-28 DIAGNOSIS — R1310 Dysphagia, unspecified: Secondary | ICD-10-CM | POA: Diagnosis not present

## 2021-09-28 DIAGNOSIS — R627 Adult failure to thrive: Secondary | ICD-10-CM

## 2021-09-28 DIAGNOSIS — K449 Diaphragmatic hernia without obstruction or gangrene: Secondary | ICD-10-CM

## 2021-09-28 LAB — CBC WITH DIFFERENTIAL/PLATELET
Basophils Absolute: 0 10*3/uL (ref 0.0–0.1)
Basophils Relative: 0.6 % (ref 0.0–3.0)
Eosinophils Absolute: 0.1 10*3/uL (ref 0.0–0.7)
Eosinophils Relative: 1.1 % (ref 0.0–5.0)
HCT: 37.9 % (ref 36.0–46.0)
Hemoglobin: 13.2 g/dL (ref 12.0–15.0)
Lymphocytes Relative: 26 % (ref 12.0–46.0)
Lymphs Abs: 1.6 10*3/uL (ref 0.7–4.0)
MCHC: 34.8 g/dL (ref 30.0–36.0)
MCV: 83.5 fl (ref 78.0–100.0)
Monocytes Absolute: 0.5 10*3/uL (ref 0.1–1.0)
Monocytes Relative: 8.8 % (ref 3.0–12.0)
Neutro Abs: 4 10*3/uL (ref 1.4–7.7)
Neutrophils Relative %: 63.5 % (ref 43.0–77.0)
Platelets: 185 10*3/uL (ref 150.0–400.0)
RBC: 4.54 Mil/uL (ref 3.87–5.11)
RDW: 13.6 % (ref 11.5–15.5)
WBC: 6.2 10*3/uL (ref 4.0–10.5)

## 2021-09-28 LAB — COMPREHENSIVE METABOLIC PANEL
ALT: 12 U/L (ref 0–35)
AST: 15 U/L (ref 0–37)
Albumin: 4.2 g/dL (ref 3.5–5.2)
Alkaline Phosphatase: 84 U/L (ref 39–117)
BUN: 22 mg/dL (ref 6–23)
CO2: 37 mEq/L — ABNORMAL HIGH (ref 19–32)
Calcium: 9.6 mg/dL (ref 8.4–10.5)
Chloride: 92 mEq/L — ABNORMAL LOW (ref 96–112)
Creatinine, Ser: 2.01 mg/dL — ABNORMAL HIGH (ref 0.40–1.20)
GFR: 22.77 mL/min — ABNORMAL LOW (ref >60.00)
Glucose, Bld: 163 mg/dL — ABNORMAL HIGH (ref 70–99)
Potassium: 2.9 mEq/L — ABNORMAL LOW (ref 3.5–5.1)
Sodium: 138 mEq/L (ref 135–145)
Total Bilirubin: 0.9 mg/dL (ref 0.2–1.2)
Total Protein: 7 g/dL (ref 6.0–8.3)

## 2021-09-28 LAB — IBC + FERRITIN
Ferritin: 79.7 ng/mL (ref 10.0–291.0)
Iron: 55 ug/dL (ref 42–145)
Saturation Ratios: 21.7 % (ref 20.0–50.0)
TIBC: 253.4 ug/dL (ref 250.0–450.0)
Transferrin: 181 mg/dL — ABNORMAL LOW (ref 212.0–360.0)

## 2021-09-28 LAB — B12 AND FOLATE PANEL
Folate: 9.2 ng/mL (ref >5.9)
Vitamin B-12: 512 pg/mL (ref 211–911)

## 2021-09-28 MED ORDER — PREDNISONE 50 MG PO TABS: ORAL_TABLET | ORAL | 0 refills | Status: DC

## 2021-09-28 NOTE — Progress Notes (Signed)
After reviewing patient's instructions for CT scan tomorrow, patient informed me she cannot swallow the oral contrast for the CT scan and wishes to not have it done at this time. Informed patient that it is very important to have the CT scan done and I will reschedule the CT to another date and discuss with Ellouise Newer, PA if she can have the CT without oral contrast. Informed patient to still have labs done today and we will be in touch with her tomorrow. Patient asks if she can just go to the ED to get IV fluids. Informed patient that she can do that and get evaluated with imaging at that time. Patient states she will get her STAT labs done in our office but go to the ED if she feels worse tonight.  ?

## 2021-09-28 NOTE — Patient Instructions (Addendum)
Your provider has requested that you go to the basement level for lab work before leaving today. Press "B" on the elevator. The lab is located at the first door on the left as you exit the elevator. ? ?Continue taking pantoprazole. ? ?Your records indicate that you have an allergy/sensitivity to one or more components within IV contrast dye. We have sent a prescription of Prednisone 50 mg (3 tablets) and Benadryl 50 mg (1 tablet) to your pharmacy as a pre-mediation for your contrasted procedure which should prevent any reaction from occurring. ? ?Take (1) 50 mg tablet of prednisone 13 hours prior to your procedure. ? ?Take (1) 50 mg tablet of prednisone 7 hours prior to your procedure. ? ?Take (1) 50 mg tablet of prednisone and (1) 50 mg tablet of Benadryl 1 hour prior to your procedure. ? ?You have been scheduled for a CT scan of the abdomen and pelvis at [facility] ([address]---this is in the same building as [facility]).  ? ?You are scheduled on 10/03/21 at 4:00pm You should arrive 15 minutes prior to your appointment time for registration. Please follow the written instructions below on the day of your exam: ? ?WARNING: IF YOU ARE ALLERGIC TO IODINE/X-RAY DYE, PLEASE NOTIFY RADIOLOGY IMMEDIATELY AT [phone]! YOU WILL BE GIVEN A 13 HOUR PREMEDICATION PREP. ? ?1) Do not eat anything after 1:00pm (4 hours prior to your test) ?2) You have been given 2 bottles of oral contrast to drink. The solution may taste better if refrigerated, but do NOT add ice or any other liquid to this solution. Shake well before drinking. ?  ? Drink 1 bottle of contrast @ 2:00pm (2 hours prior to your exam) ? Drink 1 bottle of contrast @ 3:00pm (1 hour prior to your exam) ? ?You may take any medications as prescribed with a small amount of water, if necessary. If you take any of the following medications: METFORMIN, GLUCOPHAGE, GLUCOVANCE, AVANDAMET, RIOMET, FORTAMET, ACTOPLUS MET, JANUMET,  Lyle or METAGLIP, you MAY be asked to HOLD this medication 48 hours AFTER the exam. ? ?The purpose of you drinking the oral contrast is to aid in the visualization of your intestinal tract. The contrast solution may cause some diarrhea. Depending on your individual set of symptoms, you may also receive an intravenous injection of x-ray contrast/dye. Plan on being at [facility] for 30 minutes or longer, depending on the type of exam you are having performed. ? ?This test typically takes 30-45 minutes to complete. ? ?If you have any questions regarding your exam or if you need to reschedule, you may call the CT department at [phone] between the hours of 8:00 am and 5:00 pm, Monday-Friday. ? ?___________________________________________________________ ? ?You have been scheduled for a Barium Esophogram at [facility] (1st floor of the hospital) on 10/07/21 at 11:00 am. Please arrive 15 minutes prior to your appointment for registration. Make certain not to have anything to eat or drink 3 hours prior to your test. If you need to reschedule for any reason, please contact radiology at [phone] to do so. ?___________________________________________________________A barium swallow is an examination that concentrates on views of the esophagus. This tends to be a double contrast exam (barium and two liquids which, when combined, create a gas to distend the wall of the oesophagus) or single contrast (non-ionic iodine based). The study is usually tailored to your symptoms so a good history is essential. Attention is paid during the study to  the form, structure and configuration of the esophagus, looking for functional disorders (such as aspiration, dysphagia, achalasia, motility and reflux) ?EXAMINATION ?You may be asked to change into a gown, depending on the type of swallow being performed. A radiologist and radiographer will perform the procedure. The radiologist will advise you of the ?type of  contrast selected for your procedure and direct you during the exam. You will be asked to stand, sit or lie in several different positions and to hold a small amount of fluid in your mouth before being asked to swallow while the imaging is performed .In some instances you may be asked to swallow barium coated marshmallows to assess the motility of a solid food bolus. ?The exam can be recorded as a digital or video fluoroscopy procedure. ?POST PROCEDURE ?It will take 1-2 days for the barium to pass through your system. To facilitate this, it is important, unless otherwise directed, to increase your fluids for the next 24-48hrs and to resume your normal diet.  ?This test typically takes about 30 minutes to perform. ?___________________________________________________________ ? ?Due to recent changes in healthcare laws, you may see the results of your imaging and laboratory studies on MyChart before your provider has had a chance to review them.  We understand that in some cases there may be results that are confusing or concerning to you. Not all laboratory results come back in the same time frame and the provider may be waiting for multiple results in order to interpret others.  Please give [place] 48 hours in order for your provider to thoroughly review all the results before contacting the office for clarification of your results.  ? ?The  GI providers would like to encourage you to use [facility] to communicate with providers for non-urgent requests or questions.  Due to long hold times on the telephone, sending your provider a message by [facility] may be a faster and more efficient way to get a response.  Please allow 48 business hours for a response.  Please remember that this is for non-urgent requests.  ? ? ?

## 2021-09-28 NOTE — Progress Notes (Signed)
? ?Chief Complaint: Dysphagia and abdominal pain ? ?HPI: ?   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 pylori infection, known to Dr. Hilarie Fredrickson, who was referred to me by Dorothyann Peng, NP for a complaint of dysphagia.   ?   08/2016 EGD at [facility] with normal esophagus, large hiatal hernia and medium post mucosa vasectomy scar in the cardia with no evidence of previous polyp ?   11/18/2019 patient seen via telemedicine for dysphagia.  Discussed that she had a stroke and fortunately 03/07/2019 and had an issue with swallowing since then and got "choked easily".  At that time discussed that is difficult to know if her dysphagia was more neurologic post CVA or esophageal dysmotility related to large hiatal hernia.  She did have history of intramucosal adenocarcinoma at the GE junction.  Barium esophagram with upper GI was ordered.  It does not look like this is ever done. ?   09/07/2021 office visit with PCP to discuss loss of appetite.  At that time noted to have on a weight loss of around 9 pounds since January.  Started on Mirtazapine. ?   09/21/2021 modified barium swallow with speech pathology noted the patient had oropharyngeal dysphagia characterized by impaired posterior bolus propulsion and reduced tongue base retraction and reduced anterior laryngeal movement as well as pharyngeal constriction.  She was continued on pur?ed solids and thin liquids via cup.  It was discussed that overall patient's oropharyngeal swallow function did not appear to be so significantly worse to warrant the severity of her worsening symptoms and weight loss.  Is recommended she be referred to GI. ?   Today, patient is seen with a friend.  She explains that she has  just been having difficulty swallowing that has been increasing since her stroke back in 2020.  She recently was seen and had modified barium swallow as above.  Tells me that she has just been completely unable to eat and even taking a couple bites of yogurt she chokes.  Also continues with a bandlike discomfort across the upper part of her abdomen.  She is 77 pounds in our clinic today which makes for a total of around 15 pound weight loss since January.  Patient tells me that she is not hardly having a bowel movement at all because "I am not eating". ?   Denies fever, chills or blood in her stool. ? ?Past Medical History:  ?Diagnosis Date  ? Anemia 2005  ? Generally microcytic, transfusions in [address], 2012, 02/2015, 05/2015  ? Aortic stenosis   ? Moderate November 2017  ? Arthritis   ? Broken back 2013  ? Chronic back pain.   ? CAD (coronary artery disease)   ? Cardiac catheterization 2014 - 80% mid RCA and 70% OM managed medically  ? CHF (congestive heart failure) ([place])   ? Chronic bilateral pleural effusions   ? Chronic headache   ? Contrast media allergy   ? Diastolic heart failure ([place])   ? Diverticulosis   ? Esophageal cancer ([place]) 05/06/2015  ? Adenocarcinoma GE junction  ? Facial paresthesia   ? GERD (gastroesophageal reflux disease)   ? H. pylori infection   ? H/O iron deficiency   ? 05-06-15 iron  infusion ([facility])  ? HH (hiatus hernia) 2008  ? Large with associated erosions  ? Hypertension   ? Paroxysmal atrial fibrillation ([name])   ? PONV (postoperative nausea and vomiting)   ? Pulmonary emboli ([place]) 2008, 2012  ? Stroke [facility]) 1982  ? Swallowing dysfunction   ? ? ?Past Surgical History:  ?Procedure Laterality Date  ? APPENDECTOMY  1950s  ? CARDIOVERSION N/A 07/08/2019  ? Procedure: CARDIOVERSION;  Surgeon: Pixie Casino, MD;  Location: [facility] ENDOSCOPY;  Service: Cardiovascular;  Laterality: N/A;  ? CARPAL TUNNEL RELEASE Bilateral 1990s  ? [place] SURGERY  2014  ? CHOLECYSTECTOMY  1980s  ? open  ?  COLONOSCOPY N/A 02/17/2015  ? Procedure: COLONOSCOPY;  Surgeon: [name], MD;  Location: [facility] ENDOSCOPY;  Service: Endoscopy;  Laterality: N/A;  ? ESOPHAGOGASTRODUODENOSCOPY N/A 02/16/2015  ? Procedure: ESOPHAGOGASTRODUODENOSCOPY (EGD);  Surgeon: [name], MD;  Location: [facility] ENDOSCOPY;  Service: Endoscopy;  Laterality: N/A;  ? ESOPHAGOGASTRODUODENOSCOPY N/A 05/06/2015  ? Procedure: ESOPHAGOGASTRODUODENOSCOPY (EGD);  Surgeon: [name], MD;  Location: [facility] ENDOSCOPY;  Service: Endoscopy;  Laterality: N/A;  ? EUS N/A 05/20/2015  ? Procedure: UPPER ENDOSCOPIC ULTRASOUND (EUS) LINEAR;  Surgeon: [name], MD;  Location: [facility] ENDOSCOPY;  Service: Endoscopy;  Laterality: N/A;  ? exploratory lab  1950s or 1960s  ? GIVENS CAPSULE STUDY N/A 05/06/2015  ? Procedure: GIVENS CAPSULE STUDY;  Surgeon: [name], MD;  Location: [facility] ENDOSCOPY;  Service: Endoscopy;  Laterality: N/A;  ? KNEE ARTHROSCOPY WITH MEDIAL MENISECTOMY Left 04/18/2018  ? Procedure: LEFT KNEE ARTHROSCOPY WITH PARTIAL MEDIAL MENISECTOMY AND ANTERIOR CRUCIATE LIGAMENT DEBRIDEMENT;  Surgeon: [name], MD;  Location: [facility] ORS;  Service: Orthopedics;  Laterality: Left;  ? LEFT HEART CATH AND CORONARY ANGIOGRAPHY N/A 11/08/2016  ? Procedure: Left Heart Cath and Coronary Angiography;  Surgeon: [name], MD;  Location: [place] CV LAB;  Service: Cardiovascular;  Laterality: N/A;  ? LEFT HEART CATH AND CORONARY ANGIOGRAPHY N/A 08/19/2019  ? Procedure: LEFT HEART CATH AND CORONARY ANGIOGRAPHY;  Surgeon: Martinique, Peter M, MD;  Location: [place] CV LAB;  Service: Cardiovascular;  Laterality: N/A;  ? lumbar back surgery  2012  ? [place] SURGERY  1991  ? TONSILLECTOMY AND ADENOIDECTOMY  1960s  ? ? ?Current Outpatient Medications  ?Medication Sig Dispense Refill  ? amLODipine (NORVASC) 5 MG tablet TAKE (1) TABLET BY MOUTH ONCE DAILY. 90 tablet 0  ? atorvastatin (LIPITOR) 80 MG tablet Take 1 tablet (80 mg total) by mouth daily. 90 tablet  1  ? buPROPion (WELLBUTRIN XL) 150 MG 24 hr tablet TAKE (1) TABLET BY MOUTH ONCE DAILY. 90 tablet 1  ? carvedilol (COREG) 25 MG tablet Take 1 tablet (25 mg total) by mouth 2 (two) times daily with a meal. 180 tablet 1  ? cholecalciferol (VITAMIN D3) 25 MCG (1000 UNIT) tablet Take 1,000 Units by mouth daily.    ? ELIQUIS 5 MG TABS tablet TAKE (1) TABLET BY MOUTH TWICE DAILY. 60 tablet 0  ? furosemide (LASIX) 40 MG tablet TAKE 1 TABLET BY MOUTH ONCE DAILY. 90 tablet 1  ? meclizine (ANTIVERT) 25 MG tablet Take 1 tablet (25 mg total) by mouth as needed for dizziness or nausea. 90 tablet 1  ? Melatonin 5 MG CAPS Take 10 mg by mouth at bedtime.    ? mirtazapine (REMERON SOL-TAB) 15 MG disintegrating tablet Take 1 tablet (15 mg total) by mouth at bedtime. 30 tablet 3  ? ondansetron (  ZOFRAN) 4 MG tablet Take 1 tablet (4 mg total) by mouth every 8 (eight) hours as needed for nausea or vomiting. 20 tablet 3  ? pantoprazole (PROTONIX) 40 MG tablet Take 1 tablet (40 mg total) by mouth 2 (two) times daily. 180 tablet 1  ? Pediatric Multiple Vitamins (FLINTSTONES MULTIVITAMIN PO) Take 1 tablet by mouth every evening.    ? temazepam (RESTORIL) 15 MG capsule TAKE (1) CAPSULE BY MOUTH AT BEDTIME. 30 capsule 0  ? traMADol (ULTRAM) 50 MG tablet Take 1 tablet (50 mg total) by mouth every 8 (eight) hours. TAKE (1) TABLET BY MOUTH EVERY 8 HOURS. 90 tablet 2  ? vitamin C (ASCORBIC ACID) 500 MG tablet Take 500 mg by mouth daily.    ? potassium chloride (KLOR-CON) 10 MEQ tablet Take 1 tablet (10 mEq total) by mouth daily. 90 tablet 3  ? ?No current facility-administered medications for this visit.  ? ? ?Allergies as of 09/28/2021 - Review Complete 09/20/2021  ?Allergen Reaction Noted  ? Iodinated contrast media Anaphylaxis and Other (See Comments) 10/04/2010  ? Iodine Anaphylaxis 03/20/2019  ? Ioxaglate Anaphylaxis and Other (See Comments) 09/05/2015  ? Lactose intolerance (gi) Anaphylaxis 02/18/2018  ? Red dye Anaphylaxis 03/24/2016  ?  Hydralazine Anxiety and Other (See Comments) 08/24/2014  ? Milk-related compounds Other (See Comments) 02/06/2014  ? Propoxyphene Rash and Anxiety 02/06/2014  ? ? ?Family History  ?Problem Relation Age of Onset

## 2021-09-29 ENCOUNTER — Encounter (HOSPITAL_COMMUNITY): Payer: Self-pay | Admitting: Emergency Medicine

## 2021-09-29 ENCOUNTER — Encounter: Payer: Self-pay | Admitting: Adult Health

## 2021-09-29 ENCOUNTER — Telehealth: Payer: Self-pay | Admitting: Physician Assistant

## 2021-09-29 ENCOUNTER — Inpatient Hospital Stay (HOSPITAL_COMMUNITY)
Admission: EM | Admit: 2021-09-29 | Discharge: 2021-10-02 | DRG: 682 | Disposition: A | Discharge status: Hospice | Payer: Medicare Other | Source: Ambulatory Visit | Attending: Internal Medicine | Admitting: Internal Medicine

## 2021-09-29 ENCOUNTER — Telehealth: Payer: Self-pay | Admitting: Adult Health

## 2021-09-29 ENCOUNTER — Other Ambulatory Visit: Payer: Self-pay

## 2021-09-29 ENCOUNTER — Emergency Department (HOSPITAL_COMMUNITY): Payer: Medicare Other

## 2021-09-29 ENCOUNTER — Inpatient Hospital Stay: Admission: RE | Admit: 2021-09-29 | Payer: Medicare Other | Source: Ambulatory Visit

## 2021-09-29 DIAGNOSIS — K449 Diaphragmatic hernia without obstruction or gangrene: Secondary | ICD-10-CM | POA: Diagnosis present

## 2021-09-29 DIAGNOSIS — I69391 Dysphagia following cerebral infarction: Secondary | ICD-10-CM | POA: Diagnosis not present

## 2021-09-29 DIAGNOSIS — E782 Mixed hyperlipidemia: Secondary | ICD-10-CM | POA: Diagnosis present

## 2021-09-29 DIAGNOSIS — Z823 Family history of stroke: Secondary | ICD-10-CM

## 2021-09-29 DIAGNOSIS — Z888 Allergy status to other drugs, medicaments and biological substances status: Secondary | ICD-10-CM

## 2021-09-29 DIAGNOSIS — Z79899 Other long term (current) drug therapy: Secondary | ICD-10-CM

## 2021-09-29 DIAGNOSIS — Z681 Body mass index (BMI) 19 or less, adult: Secondary | ICD-10-CM

## 2021-09-29 DIAGNOSIS — F32A Depression, unspecified: Secondary | ICD-10-CM | POA: Diagnosis present

## 2021-09-29 DIAGNOSIS — Z8249 Family history of ischemic heart disease and other diseases of the circulatory system: Secondary | ICD-10-CM

## 2021-09-29 DIAGNOSIS — I10 Essential (primary) hypertension: Secondary | ICD-10-CM | POA: Diagnosis present

## 2021-09-29 DIAGNOSIS — N179 Acute kidney failure, unspecified: Principal | ICD-10-CM | POA: Diagnosis present

## 2021-09-29 DIAGNOSIS — I5032 Chronic diastolic (congestive) heart failure: Secondary | ICD-10-CM | POA: Diagnosis present

## 2021-09-29 DIAGNOSIS — E876 Hypokalemia: Secondary | ICD-10-CM | POA: Diagnosis present

## 2021-09-29 DIAGNOSIS — Z91011 Allergy to milk products: Secondary | ICD-10-CM

## 2021-09-29 DIAGNOSIS — Z66 Do not resuscitate: Secondary | ICD-10-CM | POA: Diagnosis present

## 2021-09-29 DIAGNOSIS — Z8041 Family history of malignant neoplasm of ovary: Secondary | ICD-10-CM

## 2021-09-29 DIAGNOSIS — Z825 Family history of asthma and other chronic lower respiratory diseases: Secondary | ICD-10-CM

## 2021-09-29 DIAGNOSIS — I3139 Other pericardial effusion (noninflammatory): Secondary | ICD-10-CM | POA: Diagnosis present

## 2021-09-29 DIAGNOSIS — R1114 Bilious vomiting: Secondary | ICD-10-CM | POA: Diagnosis not present

## 2021-09-29 DIAGNOSIS — I2510 Atherosclerotic heart disease of native coronary artery without angina pectoris: Secondary | ICD-10-CM | POA: Diagnosis present

## 2021-09-29 DIAGNOSIS — R634 Abnormal weight loss: Secondary | ICD-10-CM | POA: Diagnosis not present

## 2021-09-29 DIAGNOSIS — K222 Esophageal obstruction: Secondary | ICD-10-CM | POA: Diagnosis present

## 2021-09-29 DIAGNOSIS — I350 Nonrheumatic aortic (valve) stenosis: Secondary | ICD-10-CM | POA: Diagnosis present

## 2021-09-29 DIAGNOSIS — H8110 Benign paroxysmal vertigo, unspecified ear: Secondary | ICD-10-CM | POA: Diagnosis present

## 2021-09-29 DIAGNOSIS — R1310 Dysphagia, unspecified: Secondary | ICD-10-CM | POA: Diagnosis present

## 2021-09-29 DIAGNOSIS — I110 Hypertensive heart disease with heart failure: Secondary | ICD-10-CM | POA: Diagnosis present

## 2021-09-29 DIAGNOSIS — Z7901 Long term (current) use of anticoagulants: Secondary | ICD-10-CM

## 2021-09-29 DIAGNOSIS — D490 Neoplasm of unspecified behavior of digestive system: Secondary | ICD-10-CM | POA: Diagnosis not present

## 2021-09-29 DIAGNOSIS — M479 Spondylosis, unspecified: Secondary | ICD-10-CM | POA: Diagnosis present

## 2021-09-29 DIAGNOSIS — K219 Gastro-esophageal reflux disease without esophagitis: Secondary | ICD-10-CM | POA: Diagnosis present

## 2021-09-29 DIAGNOSIS — Z8673 Personal history of transient ischemic attack (TIA), and cerebral infarction without residual deficits: Secondary | ICD-10-CM

## 2021-09-29 DIAGNOSIS — I480 Paroxysmal atrial fibrillation: Secondary | ICD-10-CM | POA: Diagnosis present

## 2021-09-29 DIAGNOSIS — C159 Malignant neoplasm of esophagus, unspecified: Secondary | ICD-10-CM | POA: Insufficient documentation

## 2021-09-29 DIAGNOSIS — Z8501 Personal history of malignant neoplasm of esophagus: Secondary | ICD-10-CM | POA: Diagnosis not present

## 2021-09-29 DIAGNOSIS — R64 Cachexia: Secondary | ICD-10-CM | POA: Diagnosis present

## 2021-09-29 DIAGNOSIS — I25119 Atherosclerotic heart disease of native coronary artery with unspecified angina pectoris: Secondary | ICD-10-CM | POA: Diagnosis not present

## 2021-09-29 DIAGNOSIS — C155 Malignant neoplasm of lower third of esophagus: Secondary | ICD-10-CM | POA: Diagnosis present

## 2021-09-29 DIAGNOSIS — Z91041 Radiographic dye allergy status: Secondary | ICD-10-CM

## 2021-09-29 DIAGNOSIS — F419 Anxiety disorder, unspecified: Secondary | ICD-10-CM | POA: Diagnosis present

## 2021-09-29 DIAGNOSIS — I5030 Unspecified diastolic (congestive) heart failure: Secondary | ICD-10-CM | POA: Diagnosis present

## 2021-09-29 DIAGNOSIS — R627 Adult failure to thrive: Secondary | ICD-10-CM | POA: Diagnosis present

## 2021-09-29 DIAGNOSIS — Z86711 Personal history of pulmonary embolism: Secondary | ICD-10-CM

## 2021-09-29 DIAGNOSIS — E43 Unspecified severe protein-calorie malnutrition: Secondary | ICD-10-CM | POA: Diagnosis present

## 2021-09-29 LAB — RENAL FUNCTION PANEL
Albumin: 3.5 g/dL (ref 3.5–5.0)
Anion gap: 8 (ref 5–15)
BUN: 23 mg/dL (ref 8–23)
CO2: 32 mmol/L (ref 22–32)
Calcium: 8.4 mg/dL — ABNORMAL LOW (ref 8.9–10.3)
Chloride: 98 mmol/L (ref 98–111)
Creatinine, Ser: 1.68 mg/dL — ABNORMAL HIGH (ref 0.44–1.00)
GFR, Estimated: 30 mL/min — ABNORMAL LOW (ref >60)
Glucose, Bld: 144 mg/dL — ABNORMAL HIGH (ref 70–99)
Phosphorus: 3.6 mg/dL (ref 2.5–4.6)
Potassium: 2.6 mmol/L — CL (ref 3.5–5.1)
Sodium: 138 mmol/L (ref 135–145)

## 2021-09-29 LAB — COMPREHENSIVE METABOLIC PANEL
ALT: 15 U/L (ref 0–44)
AST: 21 U/L (ref 15–41)
Albumin: 3.8 g/dL (ref 3.5–5.0)
Alkaline Phosphatase: 71 U/L (ref 38–126)
Anion gap: 12 (ref 5–15)
BUN: 26 mg/dL — ABNORMAL HIGH (ref 8–23)
CO2: 31 mmol/L (ref 22–32)
Calcium: 8.8 mg/dL — ABNORMAL LOW (ref 8.9–10.3)
Chloride: 92 mmol/L — ABNORMAL LOW (ref 98–111)
Creatinine, Ser: 2.08 mg/dL — ABNORMAL HIGH (ref 0.44–1.00)
GFR, Estimated: 23 mL/min — ABNORMAL LOW (ref >60)
Glucose, Bld: 166 mg/dL — ABNORMAL HIGH (ref 70–99)
Potassium: 2.3 mmol/L — CL (ref 3.5–5.1)
Sodium: 135 mmol/L (ref 135–145)
Total Bilirubin: 0.9 mg/dL (ref 0.3–1.2)
Total Protein: 6.7 g/dL (ref 6.5–8.1)

## 2021-09-29 LAB — URINALYSIS, ROUTINE W REFLEX MICROSCOPIC
Bacteria, UA: NONE SEEN
Bilirubin Urine: NEGATIVE
Glucose, UA: NEGATIVE mg/dL
Ketones, ur: NEGATIVE mg/dL
Nitrite: NEGATIVE
Protein, ur: NEGATIVE mg/dL
Specific Gravity, Urine: 1.011 (ref 1.005–1.030)
pH: 5 (ref 5.0–8.0)

## 2021-09-29 LAB — TROPONIN I (HIGH SENSITIVITY): Troponin I (High Sensitivity): 11 ng/L (ref <18)

## 2021-09-29 LAB — MAGNESIUM: Magnesium: 1.9 mg/dL (ref 1.7–2.4)

## 2021-09-29 LAB — CBC WITH DIFFERENTIAL/PLATELET
Abs Immature Granulocytes: 0.01 10*3/uL (ref 0.00–0.07)
Basophils Absolute: 0 10*3/uL (ref 0.0–0.1)
Basophils Relative: 0 %
Eosinophils Absolute: 0.1 10*3/uL (ref 0.0–0.5)
Eosinophils Relative: 1 %
HCT: 34.5 % — ABNORMAL LOW (ref 36.0–46.0)
Hemoglobin: 12 g/dL (ref 12.0–15.0)
Immature Granulocytes: 0 %
Lymphocytes Relative: 23 %
Lymphs Abs: 1.4 10*3/uL (ref 0.7–4.0)
MCH: 29.1 pg (ref 26.0–34.0)
MCHC: 34.8 g/dL (ref 30.0–36.0)
MCV: 83.7 fL (ref 80.0–100.0)
Monocytes Absolute: 0.5 10*3/uL (ref 0.1–1.0)
Monocytes Relative: 8 %
Neutro Abs: 4.1 10*3/uL (ref 1.7–7.7)
Neutrophils Relative %: 68 %
Platelets: 139 10*3/uL — ABNORMAL LOW (ref 150–400)
RBC: 4.12 MIL/uL (ref 3.87–5.11)
RDW: 12.7 % (ref 11.5–15.5)
WBC: 6 10*3/uL (ref 4.0–10.5)
nRBC: 0 % (ref 0.0–0.2)

## 2021-09-29 LAB — PHOSPHORUS: Phosphorus: 4 mg/dL (ref 2.5–4.6)

## 2021-09-29 LAB — LIPASE, BLOOD: Lipase: 39 U/L (ref 11–51)

## 2021-09-29 MED ORDER — POTASSIUM CHLORIDE 10 MEQ/100ML IV SOLN
INTRAVENOUS | Status: AC
  Administered 2021-09-29: 10 meq via INTRAVENOUS
  Filled 2021-09-29: qty 100

## 2021-09-29 MED ORDER — SODIUM CHLORIDE 0.9 % IV SOLN: INTRAVENOUS | Status: DC

## 2021-09-29 MED ORDER — SODIUM CHLORIDE 0.9 % IV BOLUS
250.0000 mL | Freq: Once | INTRAVENOUS | Status: AC
Start: 2021-09-29 — End: 2021-09-29
  Administered 2021-09-29: 250 mL via INTRAVENOUS

## 2021-09-29 MED ORDER — POTASSIUM CHLORIDE 20 MEQ PO PACK
20.0000 meq | PACK | Freq: Two times a day (BID) | ORAL | Status: DC
  Administered 2021-09-29 (×2): 20 meq via ORAL
  Filled 2021-09-29 (×3): qty 1

## 2021-09-29 MED ORDER — MORPHINE SULFATE (PF) 2 MG/ML IV SOLN
1.0000 mg | Freq: Once | INTRAVENOUS | Status: AC
  Administered 2021-09-29: 1 mg via INTRAVENOUS
  Filled 2021-09-29: qty 1

## 2021-09-29 MED ORDER — POTASSIUM CHLORIDE 10 MEQ/100ML IV SOLN
10.0000 meq | INTRAVENOUS | Status: AC
  Administered 2021-09-29 (×3): 10 meq via INTRAVENOUS
  Filled 2021-09-29 (×2): qty 100

## 2021-09-29 MED ORDER — APIXABAN 5 MG PO TABS: 5.0000 mg | ORAL_TABLET | Freq: Two times a day (BID) | ORAL | Status: DC

## 2021-09-29 MED ORDER — MELATONIN 5 MG PO TABS
5.0000 mg | ORAL_TABLET | Freq: Once | ORAL | Status: AC
  Administered 2021-09-29: 5 mg via ORAL
  Filled 2021-09-29: qty 1

## 2021-09-29 MED ORDER — SODIUM CHLORIDE 0.9 % IV BOLUS
1000.0000 mL | Freq: Once | INTRAVENOUS | Status: AC
  Administered 2021-09-29: 1000 mL via INTRAVENOUS

## 2021-09-29 MED ORDER — MORPHINE SULFATE (PF) 2 MG/ML IV SOLN
2.0000 mg | Freq: Once | INTRAVENOUS | Status: AC
  Administered 2021-09-29: 2 mg via INTRAVENOUS
  Filled 2021-09-29: qty 1

## 2021-09-29 MED ORDER — MAGNESIUM SULFATE 2 GM/50ML IV SOLN
2.0000 g | Freq: Once | INTRAVENOUS | Status: AC
  Administered 2021-09-29: 2 g via INTRAVENOUS
  Filled 2021-09-29: qty 50

## 2021-09-29 MED ORDER — POTASSIUM CHLORIDE 10 MEQ/100ML IV SOLN: 10.0000 meq | Freq: Once | INTRAVENOUS | Status: AC

## 2021-09-29 MED ORDER — APIXABAN 2.5 MG PO TABS
2.5000 mg | ORAL_TABLET | Freq: Two times a day (BID) | ORAL | Status: DC
  Administered 2021-10-01 – 2021-10-02 (×2): 2.5 mg via ORAL
  Filled 2021-09-29 (×4): qty 1

## 2021-09-29 MED ORDER — PANTOPRAZOLE SODIUM 40 MG IV SOLR
40.0000 mg | Freq: Once | INTRAVENOUS | Status: AC
  Administered 2021-09-29: 40 mg via INTRAVENOUS
  Filled 2021-09-29: qty 10

## 2021-09-29 NOTE — Telephone Encounter (Signed)
Patient called back stating that [facility] left her a message and she's returning it. She's requesting to speak with Tommi Rumps and stated that is urgent. ? ?Plese advise. ?

## 2021-09-29 NOTE — ED Provider Notes (Signed)
?[facility] DEPT ?Provider Note ? ? ?CSN: [id number] ?Arrival date & time: 09/29/21  1056 ? ?  ? ?History ? ?Chief Complaint  ?Patient presents with  ? abnormal labs  ? ? ?[name] is a 81 y.o. female. ? ?81 yo F with a chief complaints of not feeling well.  Patient does not really been able to eat and drink for some time.  Has had quite a bit of weight loss.  Has been seeing her GI doctor and PCP for this.  Had blood work obtained yesterday that showed an AKI with hyperkalemia and was encouraged to come to the hospital for possible admission. ? ? ? ?  ? ?Home Medications ?Prior to Admission medications   ?Medication Sig Start Date End Date Taking? Authorizing Provider  ?amLODipine (NORVASC) 5 MG tablet TAKE (1) TABLET BY MOUTH ONCE DAILY. 06/14/21   Pixie Casino, MD  ?atorvastatin (LIPITOR) 80 MG tablet Take 1 tablet (80 mg total) by mouth daily. 07/19/21 10/17/21  Nafziger, Tommi Rumps, NP  ?buPROPion (WELLBUTRIN XL) 150 MG 24 hr tablet TAKE (1) TABLET BY MOUTH ONCE DAILY. 07/19/21   Nafziger, Tommi Rumps, NP  ?carvedilol (COREG) 25 MG tablet Take 1 tablet (25 mg total) by mouth 2 (two) times daily with a meal. 07/19/21 10/17/21  Nafziger, Tommi Rumps, NP  ?cholecalciferol (VITAMIN D3) 25 MCG (1000 UNIT) tablet Take 1,000 Units by mouth daily.    [provider]  ?ELIQUIS 5 MG TABS tablet TAKE (1) TABLET BY MOUTH TWICE DAILY. 09/06/21   Nafziger, Tommi Rumps, NP  ?furosemide (LASIX) 40 MG tablet TAKE 1 TABLET BY MOUTH ONCE DAILY. 07/19/21   Nafziger, Tommi Rumps, NP  ?meclizine (ANTIVERT) 25 MG tablet Take 1 tablet (25 mg total) by mouth as needed for dizziness or nausea. 08/02/18   Dorothyann Peng, NP  ?Melatonin 5 MG CAPS Take 10 mg by mouth at bedtime.    [provider]  ?mirtazapine (REMERON SOL-TAB) 15 MG disintegrating tablet Take 1 tablet (15 mg total) by mouth at bedtime. 09/07/21   Nafziger, Tommi Rumps, NP  ?ondansetron (ZOFRAN) 4 MG tablet Take 1 tablet (4 mg total) by mouth every 8 (eight) hours  as needed for nausea or vomiting. 09/07/21   Nafziger, Tommi Rumps, NP  ?pantoprazole (PROTONIX) 40 MG tablet Take 1 tablet (40 mg total) by mouth 2 (two) times daily. 07/19/21 10/17/21  Dorothyann Peng, NP  ?Pediatric Multiple Vitamins (FLINTSTONES MULTIVITAMIN PO) Take 1 tablet by mouth every evening.    [provider]  ?potassium chloride (KLOR-CON) 10 MEQ tablet Take 1 tablet (10 mEq total) by mouth daily. 05/03/21 08/01/21  Nafziger, Tommi Rumps, NP  ?predniSONE (DELTASONE) 50 MG tablet Take as directed prior to CT scan 09/28/21   Levin Erp, PA  ?temazepam (RESTORIL) 15 MG capsule TAKE (1) CAPSULE BY MOUTH AT BEDTIME. 06/14/21   Nafziger, Tommi Rumps, NP  ?traMADol (ULTRAM) 50 MG tablet Take 1 tablet (50 mg total) by mouth every 8 (eight) hours. TAKE (1) TABLET BY MOUTH EVERY 8 HOURS. 09/07/21   Nafziger, Tommi Rumps, NP  ?vitamin C (ASCORBIC ACID) 500 MG tablet Take 500 mg by mouth daily.    [provider]  ?   ? ?Allergies    ?Iodinated contrast media, Iodine, Ioxaglate, Lactose intolerance (gi), Red dye, Hydralazine, Milk-related compounds, and Propoxyphene   ? ?Review of Systems   ?Review of Systems ? ?Physical Exam ?Updated Vital Signs ?BP (!) 107/55   Pulse 76   Temp 97.6 ?F (36.4 ?C) (Oral)  Resp 13   SpO2 97%  ?Physical Exam ?Vitals and nursing note reviewed.  ?Constitutional:   ?   General: She is not in acute distress. ?   Appearance: She is well-developed. She is not diaphoretic.  ?HENT:  ?   Head: Normocephalic and atraumatic.  ?Eyes:  ?   Pupils: Pupils are equal, round, and reactive to light.  ?Cardiovascular:  ?   Rate and Rhythm: Normal rate and regular rhythm.  ?   Heart sounds: No murmur heard. ?  No friction rub. No gallop.  ?Pulmonary:  ?   Effort: Pulmonary effort is normal.  ?   Breath sounds: No wheezing or rales.  ?Abdominal:  ?   General: There is no distension.  ?   Palpations: Abdomen is soft.  ?   Tenderness: There is no abdominal tenderness.  ?   Comments: No obvious discomfort on  abdominal exam.  ?Musculoskeletal:     ?   General: No tenderness.  ?   Cervical back: Normal range of motion and neck supple.  ?Skin: ?   General: Skin is warm and dry.  ?Neurological:  ?   Mental Status: She is alert and oriented to person, place, and time.  ?Psychiatric:     ?   Behavior: Behavior normal.  ? ? ?ED Results / Procedures / Treatments   ?Labs ?(all labs ordered are listed, but only abnormal results are displayed) ?Labs Reviewed  ?CBC WITH DIFFERENTIAL/PLATELET - Abnormal; Notable for the following components:  ?    Result Value  ? HCT 34.5 (*)   ? Platelets 139 (*)   ? All other components within normal limits  ?COMPREHENSIVE METABOLIC PANEL - Abnormal; Notable for the following components:  ? Potassium 2.3 (*)   ? Chloride 92 (*)   ? Glucose, Bld 166 (*)   ? BUN 26 (*)   ? Creatinine, Ser 2.08 (*)   ? Calcium 8.8 (*)   ? GFR, Estimated 23 (*)   ? All other components within normal limits  ?URINALYSIS, ROUTINE W REFLEX MICROSCOPIC - Abnormal; Notable for the following components:  ? Hgb urine dipstick SMALL (*)   ? Leukocytes,Ua MODERATE (*)   ? All other components within normal limits  ?LIPASE, BLOOD  ?MAGNESIUM  ?TROPONIN I (HIGH SENSITIVITY)  ? ? ?EKG ?EKG Interpretation ? ?Date/Time:  Thursday September 29 2021 11:45:16 EDT ?Ventricular Rate:  67 ?PR Interval:    ?QRS Duration: 89 ?QT Interval:  422 ?QTC Calculation: 446 ?R Axis:   74 ?Text Interpretation: Atrial fibrillation Repol abnrm suggests ischemia, diffuse leads scooped st segments in inferior and lateral leads not seen on prior Otherwise no significant change Confirmed by Deno Etienne [phone]) on 09/29/2021 11:56:05 AM ? ?Radiology ?CT CHEST ABDOMEN PELVIS WO CONTRAST ? ?Result Date: 09/29/2021 ?CLINICAL DATA:  Weight loss, unintended EXAM: CT CHEST, ABDOMEN AND PELVIS WITHOUT CONTRAST TECHNIQUE: Multidetector CT imaging of the chest, abdomen and pelvis was performed following the standard protocol without IV contrast. RADIATION DOSE REDUCTION:  This exam was performed according to the departmental dose-optimization program which includes automated exposure control, adjustment of the mA and/or kV according to patient size and/or use of iterative reconstruction technique. COMPARISON:  CT 02/16/2018, PET-CT 05/31/2015. FINDINGS: CT CHEST FINDINGS Cardiovascular: Normal cardiac size.Small pericardial effusion similar to prior exam.Coronary artery and mitral annular calcifications. Extensive atherosclerotic calcifications of the thoracic aorta.Normal size main and branch pulmonary arteries. Mediastinum/Nodes: No lymphadenopathy.Large hiatal hernia. The trachea is unremarkable. Lungs/Pleura: Tiny 2 and 3  mm right lower lobe subpleural pulmonary nodules (series 4, image 67). These were previously obscured by a pleural effusion.No other suspicious nodules.No pleural effusion.No pneumothorax. Musculoskeletal: Prior C5-C7 ACDF. Trace anterolisthesis at C7-T1. Multilevel degenerative changes of the thoracic spine with bridging anterolateral osteophyte formation consistent with diffuse idiopathic skeletal hyperostosis. No evidence of fracture. No suspicious lytic or blastic lesions. CT ABDOMEN PELVIS FINDINGS Hepatobiliary: No focal liver abnormality is seen. Prior cholecystectomy with prominent common bile duct, similar to prior exam and likely related to post cholecystectomy state. Pancreas: The pancreas is mildly atrophic.  No ductal dilation. Spleen: The spleen is normal in size. There is an unchanged peripherally calcified cystic lesion in the anterior spleen, likely posttraumatic/post infectious. Adrenals/Urinary Tract: Adrenal glands are unremarkable. Mild renal cortical atrophy bilaterally. No hydronephrosis. Bilateral renal vascular calcifications. There is a punctate nonobstructive stone in the left mid kidney and right lower pole. The bladder is moderately distended but unremarkable. Stomach/Bowel: Large hiatal hernia containing the majority of the stomach.  There is no evidence of bowel obstruction. Prior appendectomy. Vascular/Lymphatic: Aortoiliac atherosclerotic calcifications. No AAA. No lymphadenopathy. Reproductive: Unremarkable. Other: No abdominal wal

## 2021-09-29 NOTE — H&P (Signed)
?History and Physical  ? ? ?Patient: [name] FYB:[id number] DOB: 1941-05-12 ?DOA: 09/29/2021 ?DOS: the patient was seen and examined on 09/29/2021 ?PCP: Dorothyann Peng, NP  ?Patient coming from: Home ? ?Chief Complaint:  ?Chief Complaint  ?Patient presents with  ? abnormal labs  ? ?HPI: [name] is a 81 y.o. female with medical history significant of a fib on eliquis, HTN, CVA, HFpEF, esophageal cancer, HLD, CAD. Presenting with dysphagia and abnormal labs. She states that she has had long standing problems with swallowing since her stroke. She is able to tell me she thinks she has worsened in the last 3 months or so. She is more fatigued and can "barely stand" on her feet. She has poor PO intake over this time. She can take a few sips of Ensure clear, but then it becomes too much. She has difficulty with certain pills, but not so with others. She has been following with her GI team on this. She had labs yesterday that were concerning for AKI and hypokalemia. She was advised to go to the ED for assistance. She denies any other aggravating or alleviating factors.  ? ?Review of Systems: As mentioned in the history of present illness. All other systems reviewed and are negative. ?Past Medical History:  ?Diagnosis Date  ? Anemia 2005  ? Generally microcytic, transfusions in [address], 2012, 02/2015, 05/2015  ? Aortic stenosis   ? Moderate November 2017  ? Arthritis   ? Broken back 2013  ? Chronic back pain.   ? CAD (coronary artery disease)   ? Cardiac catheterization 2014 - 80% mid RCA and 70% OM managed medically  ? CHF (congestive heart failure) ([place])   ? Chronic bilateral pleural effusions   ? Chronic headache   ? Contrast media allergy   ? Diastolic heart failure ([place])   ? Diverticulosis   ? Esophageal cancer ([place]) 05/06/2015  ? Adenocarcinoma GE junction  ? Facial paresthesia   ? GERD (gastroesophageal reflux disease)   ? H. pylori infection   ? H/O iron deficiency   ? 05-06-15 iron infusion ([facility])  ? HH  (hiatus hernia) 2008  ? Large with associated erosions  ? Hypertension   ? Paroxysmal atrial fibrillation ([name])   ? PONV (postoperative nausea and vomiting)   ? Pulmonary emboli ([place]) 2008, 2012  ? Stroke [facility]) 1982  ? Swallowing dysfunction   ? ?Past Surgical History:  ?Procedure Laterality Date  ? APPENDECTOMY  1950s  ? CARDIOVERSION N/A 07/08/2019  ? Procedure: CARDIOVERSION;  Surgeon: Pixie Casino, MD;  Location: [facility] ENDOSCOPY;  Service: Cardiovascular;  Laterality: N/A;  ? CARPAL TUNNEL RELEASE Bilateral 1990s  ? [place] SURGERY  2014  ? CHOLECYSTECTOMY  1980s  ? open  ? COLONOSCOPY N/A 02/17/2015  ? Procedure: COLONOSCOPY;  Surgeon: [name], MD;  Location: [facility] ENDOSCOPY;  Service: Endoscopy;  Laterality: N/A;  ? ESOPHAGOGASTRODUODENOSCOPY N/A 02/16/2015  ? Procedure: ESOPHAGOGASTRODUODENOSCOPY (EGD);  Surgeon: [name], MD;  Location: [facility] ENDOSCOPY;  Service: Endoscopy;  Laterality: N/A;  ? ESOPHAGOGASTRODUODENOSCOPY N/A 05/06/2015  ? Procedure: ESOPHAGOGASTRODUODENOSCOPY (EGD);  Surgeon: [name], MD;  Location: [facility] ENDOSCOPY;  Service: Endoscopy;  Laterality: N/A;  ? EUS N/A 05/20/2015  ? Procedure: UPPER ENDOSCOPIC ULTRASOUND (EUS) LINEAR;  Surgeon: [name], MD;  Location: [facility] ENDOSCOPY;  Service: Endoscopy;  Laterality: N/A;  ? exploratory lab  1950s or 1960s  ? GIVENS CAPSULE STUDY N/A 05/06/2015  ? Procedure: GIVENS CAPSULE STUDY;  Surgeon: Ulice Dash  Everitt Amber, MD;  Location: [facility];  Service: Endoscopy;  Laterality: N/A;  ? KNEE ARTHROSCOPY WITH MEDIAL MENISECTOMY Left 04/18/2018  ? Procedure: LEFT KNEE ARTHROSCOPY WITH PARTIAL MEDIAL MENISECTOMY AND ANTERIOR CRUCIATE LIGAMENT DEBRIDEMENT;  Surgeon: [name], MD;  Location: [facility] ORS;  Service: Orthopedics;  Laterality: Left;  ? LEFT HEART CATH AND CORONARY ANGIOGRAPHY N/A 11/08/2016  ? Procedure: Left Heart Cath and Coronary Angiography;  Surgeon: [name], MD;  Location: [place] CV LAB;  Service:  Cardiovascular;  Laterality: N/A;  ? LEFT HEART CATH AND CORONARY ANGIOGRAPHY N/A 08/19/2019  ? Procedure: LEFT HEART CATH AND CORONARY ANGIOGRAPHY;  Surgeon: Martinique, Peter M, MD;  Location: [place] CV LAB;  Service: Cardiovascular;  Laterality: N/A;  ? lumbar back surgery  2012  ? [place] SURGERY  1991  ? TONSILLECTOMY AND ADENOIDECTOMY  1960s  ? ?Social History:  reports that she has never smoked. She has never used smokeless tobacco. She reports that she does not drink alcohol and does not use drugs. ? ?Allergies  ?Allergen Reactions  ? Iodinated Contrast Media Anaphylaxis and Other (See Comments)  ?  IPD dye ?Info given by patient  ? Iodine Anaphylaxis  ? Ioxaglate Anaphylaxis and Other (See Comments)  ? Lactose Intolerance (Gi) Anaphylaxis  ? Red Dye Anaphylaxis  ? Hydralazine Anxiety and Other (See Comments)  ?  Facial flushing, pt prefers not to use it.   ? Milk-Related Compounds Other (See Comments)  ?  Lactose intolerance ?Can tolerate milk if its cooked into the recipe, just can't drink milk   ? Propoxyphene Rash and Anxiety  ? ? ?Family History  ?Problem Relation Age of Onset  ? Stroke Mother   ? Heart disease Mother   ? Emphysema Father   ? Liver disease Sister   ? Ovarian cancer Sister   ? Stroke Sister   ? Other Child   ?     died at birth  ? Colon cancer Neg Hx   ? Esophageal cancer Neg Hx   ? Stomach cancer Neg Hx   ? Pancreatic cancer Neg Hx   ? ? ?Prior to Admission medications   ?Medication Sig Start Date End Date Taking? Authorizing Provider  ?amLODipine (NORVASC) 5 MG tablet TAKE (1) TABLET BY MOUTH ONCE DAILY. 06/14/21   Pixie Casino, MD  ?atorvastatin (LIPITOR) 80 MG tablet Take 1 tablet (80 mg total) by mouth daily. 07/19/21 10/17/21  Nafziger, Tommi Rumps, NP  ?buPROPion (WELLBUTRIN XL) 150 MG 24 hr tablet TAKE (1) TABLET BY MOUTH ONCE DAILY. 07/19/21   Nafziger, Tommi Rumps, NP  ?carvedilol (COREG) 25 MG tablet Take 1 tablet (25 mg total) by mouth 2 (two) times daily with a meal. 07/19/21 10/17/21   Nafziger, Tommi Rumps, NP  ?cholecalciferol (VITAMIN D3) 25 MCG (1000 UNIT) tablet Take 1,000 Units by mouth daily.    [provider]  ?ELIQUIS 5 MG TABS tablet TAKE (1) TABLET BY MOUTH TWICE DAILY. 09/06/21   Nafziger, Tommi Rumps, NP  ?furosemide (LASIX) 40 MG tablet TAKE 1 TABLET BY MOUTH ONCE DAILY. 07/19/21   Nafziger, Tommi Rumps, NP  ?meclizine (ANTIVERT) 25 MG tablet Take 1 tablet (25 mg total) by mouth as needed for dizziness or nausea. 08/02/18   Dorothyann Peng, NP  ?Melatonin 5 MG CAPS Take 10 mg by mouth at bedtime.    [provider]  ?mirtazapine (REMERON SOL-TAB) 15 MG disintegrating tablet Take 1 tablet (15 mg total) by mouth at bedtime. 09/07/21   Dorothyann Peng, NP  ?  ondansetron (ZOFRAN) 4 MG tablet Take 1 tablet (4 mg total) by mouth every 8 (eight) hours as needed for nausea or vomiting. 09/07/21   Nafziger, Tommi Rumps, NP  ?pantoprazole (PROTONIX) 40 MG tablet Take 1 tablet (40 mg total) by mouth 2 (two) times daily. 07/19/21 10/17/21  Dorothyann Peng, NP  ?Pediatric Multiple Vitamins (FLINTSTONES MULTIVITAMIN PO) Take 1 tablet by mouth every evening.    [provider]  ?potassium chloride (KLOR-CON) 10 MEQ tablet Take 1 tablet (10 mEq total) by mouth daily. 05/03/21 08/01/21  Nafziger, Tommi Rumps, NP  ?predniSONE (DELTASONE) 50 MG tablet Take as directed prior to CT scan 09/28/21   Levin Erp, PA  ?temazepam (RESTORIL) 15 MG capsule TAKE (1) CAPSULE BY MOUTH AT BEDTIME. 06/14/21   Nafziger, Tommi Rumps, NP  ?traMADol (ULTRAM) 50 MG tablet Take 1 tablet (50 mg total) by mouth every 8 (eight) hours. TAKE (1) TABLET BY MOUTH EVERY 8 HOURS. 09/07/21   Nafziger, Tommi Rumps, NP  ?vitamin C (ASCORBIC ACID) 500 MG tablet Take 500 mg by mouth daily.    [provider]  ? ? ?Physical Exam: ?Vitals:  ? 09/29/21 1107 09/29/21 1200 09/29/21 1230 09/29/21 1300  ?BP: 115/67 (!) 106/45 (!) 124/52 (!) 107/55  ?Pulse: 83 72 66 76  ?Resp: '18 14 20 13  '$ ?Temp: 97.6 ?F (36.4 ?C)     ?TempSrc: Oral     ?SpO2: 100% 99% 100% 97%   ? ?General: 81 y.o. female resting in bed in NAD ?Eyes: PERRL, normal sclera ?ENMT: Nares patent w/o discharge, orophaynx clear, dentition normal, ears w/o discharge/lesions/ulcers ?Neck: thin, trachea midline ?Musician

## 2021-09-29 NOTE — ED Triage Notes (Signed)
Per pt, states she has not felt well  "in a good while"-PCP sent her here due to abnormal labs-patient complaining of lethargy, poor PO intake, and back pain-has been out of her tramadol ?

## 2021-09-29 NOTE — Telephone Encounter (Signed)
Telephone call ? ?09/29/2021 9:00 AM ? ?Called and spoke with the patient in regards to lab results showing a low potassium and likely dehydration given that she is unable to tolerate p.o. at this time.  She will need replenishment of her potassium IV and also requires a CT of the chest, abdomen and pelvis with IV contrast only given her weight loss, significant dysphagia and history of esophageal cancer.  Discussed that she can have this worked up while she is in the hospital as she really feels quite ill and weak.  She has lost about 15 pounds since January of this year. ? ?Also spoke with patient's son who is her primary caretaker, [name].  He verbalized understanding. ? ?Patient tells me she will likely get to the ER later today as she does not have a ride currently. ? ?Ellouise Newer, PA-C ?

## 2021-09-29 NOTE — Consult Note (Addendum)
? ? ? ? ? Consultation ? ?Referring Provider:   Dr. Deno Etienne ?Primary Care Physician:  Dorothyann Peng, NP ?Primary Gastroenterologist:  Dr. Hilarie Fredrickson       ?Reason for Consultation:     Dysphagia, failure to thrive ? ? Impression   ? 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 ?CT chest abdomen pelvis here without contrast no concern for recurrent cancer other than small pulmonary nodules, will need follow-up 3 months.  Does show prior C5-C7 ACDF ? ?GERD large hiatal hernia ?Continue Protonix 40 mg twice daily ? ?Previous adenocarcinoma of the GE junction treated with ESD February 2017 ?Last endoscopy 08/2016 at [facility] with normal esophagus, large hiatal hernia, medium post mucosa vasectomy scar in the cardia with no evidence of previous polyp. ? ?AKI with hypokalemia ?BUN 26 Cr 2.08  ?GFR 23  ?Potassium 2.3  Magnesium 1.9  ? ?Failure to thrive/severe protein calorie malnutrition ?Albumin 09/29/2021  3.8  ?Secondary to Dysphagia ?- RD consult ?-Patient has her pastor with her, not interested in feeding tube  ? ?CVA 03/07/2019 ?Worsening dysphagia since that time. ? ?Atrial fibrillation ?On Eliquis, last dose was this AM.  ? ?CAD ?08/19/2019 cardiac catheterization with moderate two-vessel coronary artery disease ? ?CHF- normal EF ?03/07/2019 ejection fraction greater than 65, cannot calculate diastolic unction.  Aortic stenosis. ? ? ? Plan  ? ?-Replete potassium, give IV fluids. ?-Continue pantoprazole twice daily IV. ?-While here try to give medications IV if able. ?-Patient states she is unable to do barium with barium swallow, can reconsult speech pathology and consider RD consult.   ?-Patient is requesting to be able to try mashed potatoes-  had modified barium swallow 09/21/2021 suggesting dysphagia 1 diet thin liquids with cup no  straw, slow rate, sitting up at 90 degrees.  Can likely give diet based off last speech pathology evaluation ? ?At this point, patient would likely benefit from repeat endoscopic evaluation if she cannot undergo barium swallow, would need replacement of potassium, and Eliquis to be held.   ?Patient understands she is high risk for the procedure we discussed this in detail.  ?--Patient states she is interested in DNR, and palliative care consult.  Uncertain if she wants to proceed with endoscopy. ?-Hold Eliquis for now ?-Plan for endoscopy Saturday but will reevaluate tomorrow to see if patient even wants endoscopic evaluation at this time.  ? ?Thank you for your kind consultation, we will continue to follow. ?       ? HPI:   ?[name] is a 81 y.o. female with past medical history significant for CAD (08/19/2019 cardiac cath with moderate two-vessel CAD) , adenocarcinoma of the GE junction treated with ESD in February 2017, large hiatal hernia, aortic stenosis, stroke and A-fib on Eliquis and CHF as well as GERD and history of H. pylori infection. ?Patient's pastor is in the room with her. ? ? 08/2016 EGD at [facility] with normal esophagus, large hiatal hernia and medium post mucosa vasectomy scar in the cardia with no evidence of previous polyp ?   11/18/2019 patient seen via telemedicine for dysphagia.  Discussed that she had a stroke and fortunately 03/07/2019 and had an issue with swallowing since then and got "choked easily".  At that time discussed that is difficult to know if her dysphagia was more neurologic post CVA or esophageal dysmotility related  to large hiatal hernia.  She did have history of intramucosal adenocarcinoma at the GE junction.  Barium esophagram with upper GI was ordered.  It does not look like this is ever done. ?   09/07/2021 office visit with PCP to discuss loss of appetite.  At that time noted to have on a weight loss of around 9 pounds since January.  Started on Mirtazapine. ?   09/21/2021  modified barium swallow with speech pathology noted the patient had oropharyngeal dysphagia characterized by impaired posterior bolus propulsion and reduced tongue base retraction and reduced anterior laryngeal movement as well as pharyngeal constriction.  She was continued on pur?ed solids and thin liquids via cup.  It was discussed that overall patient's oropharyngeal swallow function did not appear to be so significantly worse to warrant the severity of her worsening symptoms and weight loss. ? ?Patient was seen in the office 09/28/2021 by Ellouise Newer, PA.  Found to have severe hypokalemia with AKI, due to inability to replace potassium p.o., patient sent to ER. ? ?Patient states since her stroke in 2020 she has had increase in dysphagia symptoms, worsening weight loss and weakness. ?Patient states she has GERD/reflux is on pantoprazole twice daily. ?Has a lot of mucus she has to try to spit up with clearing of her throat can be dark at times, denies coffee-ground emesis. ?She does have nausea, rare vomiting. ?States she will get choked and cough with liquids, cream potatoes. ?For the last 2 to 3 months she is only been able to drink Ensure 1 to 2 teaspoons of cream potatoes or very soft foods. ?If she tries to take potassium pills or even some of her other pills like amlodipine it will get choked and she will have to throw it up. ?Has epigastric tightness in her abdomen to her back, feels like a belt too tight. ?Last bowel movement yesterday, soft, denies hematochezia or melena. ?Patient does have abdominal bloating, and gets full very quickly even with 1-2 swallows of liquid. ?Patient's had about 26 pounds loss since November according to our records.   ?States she is lost at least 8 to 10 pounds in the last 3 months. ? ?Wt Readings from Last 10 Encounters:  ?09/28/21 34.9 kg  ?09/20/21 35.4 kg  ?09/07/21 37.8 kg  ?07/06/21 41.7 kg  ?05/03/21 43.1 kg  ?12/15/20 46.1 kg  ?11/28/20 44.9 kg  ?08/25/20 45.4 kg   ?04/08/20 46.1 kg  ?03/11/20 45.4 kg  ? ? ?Past Medical History:  ?Diagnosis Date  ? Anemia 2005  ? Generally microcytic, transfusions in [address], 2012, 02/2015, 05/2015  ? Aortic stenosis   ? Moderate November 2017  ? Arthritis   ? Broken back 2013  ? Chronic back pain.   ? CAD (coronary artery disease)   ? Cardiac catheterization 2014 - 80% mid RCA and 70% OM managed medically  ? CHF (congestive heart failure) ([place])   ? Chronic bilateral pleural effusions   ? Chronic headache   ? Contrast media allergy   ? Diastolic heart failure ([place])   ? Diverticulosis   ? Esophageal cancer ([place]) 05/06/2015  ? Adenocarcinoma GE junction  ? Facial paresthesia   ? GERD (gastroesophageal reflux disease)   ? H. pylori infection   ? H/O iron deficiency   ? 05-06-15 iron infusion ([facility])  ? HH (hiatus hernia) 2008  ? Large with associated erosions  ? Hypertension   ? Paroxysmal atrial fibrillation ([name])   ? PONV (postoperative nausea and vomiting)   ?  Pulmonary emboli ([place]) 2008, 2012  ? Stroke [facility]) 1982  ? Swallowing dysfunction   ? ? ?Surgical History:  ?She  has a past surgical history that includes Lumbar disc surgery (1991); lumbar back surgery (2012); Cervical disc surgery (2014); Cholecystectomy (1980s); Appendectomy (1950s); Tonsillectomy and adenoidectomy (1960s); Carpal tunnel release (Bilateral, 1990s); Esophagogastroduodenoscopy (N/A, 02/16/2015); Colonoscopy (N/A, 02/17/2015); exploratory lab (1950s or 1960s); Esophagogastroduodenoscopy (N/A, 05/06/2015); [place] capsule study (N/A, 05/06/2015); EUS (N/A, 05/20/2015); LEFT HEART CATH AND CORONARY ANGIOGRAPHY (N/A, 11/08/2016); Knee arthroscopy with medial menisectomy (Left, 04/18/2018); Cardioversion (N/A, 07/08/2019); and LEFT HEART CATH AND CORONARY ANGIOGRAPHY (N/A, 08/19/2019). ?Family History:  ?Her family history includes Emphysema in her father; Heart disease in her mother; Liver disease in her sister; Other in her child; Ovarian cancer in her sister; Stroke in her mother and  sister. ?Social History:  ? reports that she has never smoked. She has never used smokeless tobacco. She reports that she does not drink alcohol and does not use drugs. ? ?Prior to Admission medications   ?

## 2021-09-29 NOTE — ED Notes (Signed)
Patient transported to CT 

## 2021-09-30 DIAGNOSIS — I10 Essential (primary) hypertension: Secondary | ICD-10-CM

## 2021-09-30 DIAGNOSIS — R1310 Dysphagia, unspecified: Secondary | ICD-10-CM | POA: Diagnosis not present

## 2021-09-30 DIAGNOSIS — I480 Paroxysmal atrial fibrillation: Secondary | ICD-10-CM

## 2021-09-30 DIAGNOSIS — R1114 Bilious vomiting: Secondary | ICD-10-CM

## 2021-09-30 DIAGNOSIS — E782 Mixed hyperlipidemia: Secondary | ICD-10-CM

## 2021-09-30 DIAGNOSIS — I5032 Chronic diastolic (congestive) heart failure: Secondary | ICD-10-CM | POA: Diagnosis not present

## 2021-09-30 DIAGNOSIS — N179 Acute kidney failure, unspecified: Secondary | ICD-10-CM | POA: Diagnosis not present

## 2021-09-30 DIAGNOSIS — C159 Malignant neoplasm of esophagus, unspecified: Secondary | ICD-10-CM | POA: Insufficient documentation

## 2021-09-30 DIAGNOSIS — Z8501 Personal history of malignant neoplasm of esophagus: Secondary | ICD-10-CM

## 2021-09-30 LAB — CBC
HCT: 30.3 % — ABNORMAL LOW (ref 36.0–46.0)
Hemoglobin: 10.5 g/dL — ABNORMAL LOW (ref 12.0–15.0)
MCH: 29.6 pg (ref 26.0–34.0)
MCHC: 34.7 g/dL (ref 30.0–36.0)
MCV: 85.4 fL (ref 80.0–100.0)
Platelets: 122 10*3/uL — ABNORMAL LOW (ref 150–400)
RBC: 3.55 MIL/uL — ABNORMAL LOW (ref 3.87–5.11)
RDW: 12.8 % (ref 11.5–15.5)
WBC: 4.7 10*3/uL (ref 4.0–10.5)
nRBC: 0 % (ref 0.0–0.2)

## 2021-09-30 LAB — RENAL FUNCTION PANEL
Albumin: 3.1 g/dL — ABNORMAL LOW (ref 3.5–5.0)
Anion gap: 6 (ref 5–15)
BUN: 17 mg/dL (ref 8–23)
CO2: 29 mmol/L (ref 22–32)
Calcium: 8 mg/dL — ABNORMAL LOW (ref 8.9–10.3)
Chloride: 103 mmol/L (ref 98–111)
Creatinine, Ser: 1.3 mg/dL — ABNORMAL HIGH (ref 0.44–1.00)
GFR, Estimated: 41 mL/min — ABNORMAL LOW (ref >60)
Glucose, Bld: 134 mg/dL — ABNORMAL HIGH (ref 70–99)
Phosphorus: 3.4 mg/dL (ref 2.5–4.6)
Potassium: 2.3 mmol/L — CL (ref 3.5–5.1)
Sodium: 138 mmol/L (ref 135–145)

## 2021-09-30 MED ORDER — VITAMIN D 25 MCG (1000 UNIT) PO TABS
1000.0000 [IU] | ORAL_TABLET | Freq: Every day | ORAL | Status: DC
  Administered 2021-09-30: 1000 [IU] via ORAL
  Filled 2021-09-30: qty 1

## 2021-09-30 MED ORDER — ADULT MULTIVITAMIN LIQUID CH
15.0000 mL | Freq: Every day | ORAL | Status: DC
  Administered 2021-10-02: 15 mL via ORAL
  Filled 2021-09-30 (×2): qty 15

## 2021-09-30 MED ORDER — PANTOPRAZOLE SODIUM 40 MG IV SOLR
40.0000 mg | Freq: Two times a day (BID) | INTRAVENOUS | Status: DC
  Administered 2021-09-30: 40 mg via INTRAVENOUS
  Filled 2021-09-30: qty 10

## 2021-09-30 MED ORDER — PROSOURCE PLUS PO LIQD
30.0000 mL | Freq: Three times a day (TID) | ORAL | Status: DC
  Administered 2021-09-30 (×2): 30 mL via ORAL

## 2021-09-30 MED ORDER — ENSURE CLEAR PO LIQD
237.0000 mL | Freq: Three times a day (TID) | ORAL | Status: DC
  Administered 2021-09-30: 237 mL via ORAL

## 2021-09-30 MED ORDER — MORPHINE SULFATE (PF) 2 MG/ML IV SOLN: 1.0000 mg | Freq: Once | INTRAVENOUS | Status: DC

## 2021-09-30 MED ORDER — TRAMADOL HCL 50 MG PO TABS
50.0000 mg | ORAL_TABLET | Freq: Two times a day (BID) | ORAL | Status: DC | PRN
  Administered 2021-09-30: 50 mg via ORAL
  Filled 2021-09-30: qty 1

## 2021-09-30 MED ORDER — MIRTAZAPINE 15 MG PO TBDP
15.0000 mg | ORAL_TABLET | Freq: Every day | ORAL | Status: DC
  Filled 2021-09-30 (×2): qty 1

## 2021-09-30 MED ORDER — MORPHINE SULFATE (PF) 2 MG/ML IV SOLN
1.0000 mg | INTRAVENOUS | Status: DC | PRN
  Administered 2021-10-01 – 2021-10-02 (×3): 1 mg via INTRAVENOUS
  Filled 2021-09-30 (×3): qty 1

## 2021-09-30 MED ORDER — DEXTROSE IN LACTATED RINGERS 5 % IV SOLN: INTRAVENOUS | Status: DC

## 2021-09-30 MED ORDER — POTASSIUM CHLORIDE CRYS ER 20 MEQ PO TBCR
40.0000 meq | EXTENDED_RELEASE_TABLET | ORAL | Status: AC
  Administered 2021-09-30 (×3): 40 meq via ORAL
  Filled 2021-09-30 (×3): qty 2

## 2021-09-30 MED ORDER — TEMAZEPAM 15 MG PO CAPS
15.0000 mg | ORAL_CAPSULE | Freq: Every day | ORAL | Status: DC
  Administered 2021-09-30 – 2021-10-01 (×2): 15 mg via ORAL
  Filled 2021-09-30 (×2): qty 1

## 2021-09-30 MED ORDER — FAMOTIDINE 40 MG/5ML PO SUSR
10.0000 mg | Freq: Every day | ORAL | Status: DC
Start: 2021-09-30 — End: 2021-10-02
  Administered 2021-09-30 – 2021-10-01 (×2): 10.4 mg via ORAL
  Filled 2021-09-30 (×2): qty 2.5

## 2021-09-30 MED ORDER — BUPROPION HCL ER (XL) 150 MG PO TB24
150.0000 mg | ORAL_TABLET | Freq: Every day | ORAL | Status: DC
Start: 2021-09-30 — End: 2021-09-30
  Administered 2021-09-30: 150 mg via ORAL
  Filled 2021-09-30: qty 1

## 2021-09-30 MED ORDER — POTASSIUM CHLORIDE 20 MEQ PO PACK
40.0000 meq | PACK | Freq: Two times a day (BID) | ORAL | Status: DC
  Filled 2021-09-30: qty 2

## 2021-09-30 MED ORDER — MELATONIN 5 MG PO TABS
5.0000 mg | ORAL_TABLET | Freq: Every day | ORAL | Status: DC
  Administered 2021-09-30 – 2021-10-01 (×2): 5 mg via ORAL
  Filled 2021-09-30 (×2): qty 1

## 2021-09-30 MED ORDER — ADULT MULTIVITAMIN W/MINERALS CH
1.0000 | ORAL_TABLET | Freq: Every evening | ORAL | Status: DC
Start: 2021-09-30 — End: 2021-09-30

## 2021-09-30 MED ORDER — POTASSIUM CHLORIDE 20 MEQ PO PACK: 40.0000 meq | PACK | ORAL | Status: DC

## 2021-09-30 MED ORDER — ENSURE ENLIVE PO LIQD: 237.0000 mL | Freq: Three times a day (TID) | ORAL | Status: DC

## 2021-09-30 MED ORDER — PANTOPRAZOLE SODIUM 40 MG PO TBEC
40.0000 mg | DELAYED_RELEASE_TABLET | Freq: Two times a day (BID) | ORAL | Status: DC
  Administered 2021-09-30: 40 mg via ORAL
  Filled 2021-09-30: qty 1

## 2021-09-30 MED ORDER — ASCORBIC ACID 500 MG PO TABS
500.0000 mg | ORAL_TABLET | Freq: Every day | ORAL | Status: DC
  Administered 2021-09-30: 500 mg via ORAL
  Filled 2021-09-30: qty 1

## 2021-09-30 MED ORDER — PANTOPRAZOLE 2 MG/ML SUSPENSION: 40.0000 mg | Freq: Two times a day (BID) | ORAL | Status: DC

## 2021-09-30 MED ORDER — RESOURCE INSTANT PROTEIN PO PWD PACKET
1.0000 | Freq: Three times a day (TID) | ORAL | Status: DC
  Filled 2021-09-30 (×8): qty 6

## 2021-09-30 NOTE — Assessment & Plan Note (Addendum)
Resumed anticoagulation with apixaban, patient not on AV blocking agents.  ?Follow up cell count as outpatient.  ?

## 2021-09-30 NOTE — Progress Notes (Signed)
CRITICAL VALUE STICKER ? ?CRITICAL VALUE: Potassium: 2.3 ? ?RECEIVER (on-site recipient of call): Trixie Rude, RN ? ?DATE & TIME NOTIFIED: 09/30/2021 7026 ? ?MESSENGER (representative from lab): ? ?MD NOTIFIED: Olena Heckle, NP ? ?TIME OF NOTIFICATION: 3785 ? ?RESPONSE:  Acknowledged. No new orders.  ?

## 2021-09-30 NOTE — Assessment & Plan Note (Signed)
Continue with apixaban for anticoagulation  ?

## 2021-09-30 NOTE — Progress Notes (Signed)
? ? Progress Note ? ? Subjective  ?Chief Complaint: Dysphagia, failure to thrive ? ?Patient lying in bed, chalk her son is at bedside. ?Patient states she has some medications in pill form and she is unable to take these.  Requesting that all of her medications go to IV form. ?Patient also states she would like to try and talk with her primary care who she feels she is very close with as well as hospice prior to making any decisions for endoscopic evaluation. ?She is able to get some liquids very small amount states this hurts her abdomen and she feels very full very quickly. ?Patient is tearful and states she is very tired and does not know if she wants to do any more procedures. ? ? ? Objective  ? ?Vital signs in last 24 hours: ?Temp:  [97.4 ?F (36.3 ?C)-98.3 ?F (36.8 ?C)] 98 ?F (36.7 ?C) (03/31 0056) ?Pulse Rate:  [59-76] 63 (03/31 0600) ?Resp:  [13-20] 17 (03/31 0056) ?BP: (101-122)/(52-76) 101/59 (03/31 0600) ?SpO2:  [94 %-99 %] 95 % (03/31 0056) ?Weight:  [37 kg] 37 kg (03/30 1740) ?Last BM Date : 09/28/21 ? ?General:  chronically ill appearing female, very thin, cachectic ?Heart:  regular rate and rhythm occ ectopic ?Pulm: Clear anteriorly; no wheezing ?Abdomen:  Soft, Non-distended AB, Active bowel sounds. moderate tenderness in the entire abdomen and worse in epigastrium . With guarding and Without rebound ?Extremities:  Without edema. ?Msk:  Symmetrical without gross deformities. Peripheral pulses intact.  ?Neurologic:  Alert and  oriented x4;  No focal deficits.  ?Skin:   Dry and intact without significant lesions or rashes. ?Psychiatric:  Cooperative. Normal mood and affect.  Tearful ? ? ?Intake/Output from previous day: ?03/30 0701 - 03/31 0700 ?In: 1337.9 [I.V.:196.5; IV Piggyback:1141.4] ?Out: -  ?Intake/Output this shift: ?Total I/O ?In: 120 [P.O.:120] ?Out: -  ? ?Lab Results: ?Recent Labs  ?  09/28/21 ?1650 09/29/21 ?1207 09/30/21 ?2409  ?WBC 6.2 6.0 4.7  ?HGB 13.2 12.0 10.5*  ?HCT 37.9 34.5* 30.3*   ?PLT 185.0 139* 122*  ? ?BMET ?Recent Labs  ?  09/29/21 ?1207 09/29/21 ?1822 09/30/21 ?7353  ?NA 135 138 138  ?K 2.3* 2.6* 2.3*  ?CL 92* 98 103  ?CO2 31 32 29  ?GLUCOSE 166* 144* 134*  ?BUN 26* 23 17  ?CREATININE 2.08* 1.68* 1.30*  ?CALCIUM 8.8* 8.4* 8.0*  ? ?LFT ?Recent Labs  ?  09/29/21 ?1207 09/29/21 ?1822 09/30/21 ?2992  ?PROT 6.7  --   --   ?ALBUMIN 3.8   < > 3.1*  ?AST 21  --   --   ?ALT 15  --   --   ?ALKPHOS 71  --   --   ?BILITOT 0.9  --   --   ? < > = values in this interval not displayed.  ? ?PT/INR ?No results for input(s): LABPROT, INR in the last 72 hours. ? ?Studies/Results: ?CT CHEST ABDOMEN PELVIS WO CONTRAST ? ?Result Date: 09/29/2021 ?CLINICAL DATA:  Weight loss, unintended EXAM: CT CHEST, ABDOMEN AND PELVIS WITHOUT CONTRAST TECHNIQUE: Multidetector CT imaging of the chest, abdomen and pelvis was performed following the standard protocol without IV contrast. RADIATION DOSE REDUCTION: This exam was performed according to the departmental dose-optimization program which includes automated exposure control, adjustment of the mA and/or kV according to patient size and/or use of iterative reconstruction technique. COMPARISON:  CT 02/16/2018, PET-CT 05/31/2015. FINDINGS: CT CHEST FINDINGS Cardiovascular: Normal cardiac size.Small pericardial effusion similar to prior exam.Coronary artery  and mitral annular calcifications. Extensive atherosclerotic calcifications of the thoracic aorta.Normal size main and branch pulmonary arteries. Mediastinum/Nodes: No lymphadenopathy.Large hiatal hernia. The trachea is unremarkable. Lungs/Pleura: Tiny 2 and 3 mm right lower lobe subpleural pulmonary nodules (series 4, image 67). These were previously obscured by a pleural effusion.No other suspicious nodules.No pleural effusion.No pneumothorax. Musculoskeletal: Prior C5-C7 ACDF. Trace anterolisthesis at C7-T1. Multilevel degenerative changes of the thoracic spine with bridging anterolateral osteophyte formation  consistent with diffuse idiopathic skeletal hyperostosis. No evidence of fracture. No suspicious lytic or blastic lesions. CT ABDOMEN PELVIS FINDINGS Hepatobiliary: No focal liver abnormality is seen. Prior cholecystectomy with prominent common bile duct, similar to prior exam and likely related to post cholecystectomy state. Pancreas: The pancreas is mildly atrophic.  No ductal dilation. Spleen: The spleen is normal in size. There is an unchanged peripherally calcified cystic lesion in the anterior spleen, likely posttraumatic/post infectious. Adrenals/Urinary Tract: Adrenal glands are unremarkable. Mild renal cortical atrophy bilaterally. No hydronephrosis. Bilateral renal vascular calcifications. There is a punctate nonobstructive stone in the left mid kidney and right lower pole. The bladder is moderately distended but unremarkable. Stomach/Bowel: Large hiatal hernia containing the majority of the stomach. There is no evidence of bowel obstruction. Prior appendectomy. Vascular/Lymphatic: Aortoiliac atherosclerotic calcifications. No AAA. No lymphadenopathy. Reproductive: Unremarkable. Other: No abdominal wall hernia or abnormality. No abdominopelvic ascites. Musculoskeletal: Multilevel degenerative changes of the spine. Prior inter spinous fusion at L4-L5. Mild retrolisthesis at L2-L3 and L3-L4. Mild anterolisthesis at L4-L5. No suspicious lytic or blastic lesions. No evidence of acute fracture bilateral hip osteoarthritis. IMPRESSION: CHEST: Small pericardial effusion similar to prior exam in 2019. No focal airspace disease or other acute findings in the chest. Two small 2 and 3 mm pulmonary nodules in the posterior aspect of the right lower lobe. Although these are likely benign, given prior history of GE junction carcinoma, recommend follow-up chest CT in 3 months to assess for interval change. ABDOMEN/PELVIS: No acute findings in the abdomen or pelvis. Unchanged large hiatal hernia. Punctate nonobstructive  nephrolithiasis bilaterally. Electronically Signed   By: Maurine Simmering M.D.   On: 09/29/2021 12:02   ? ? ? Impression/Plan:  ? 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 ?CT chest abdomen pelvis here without contrast no concern for recurrent cancer other than small pulmonary nodules, will need follow-up 3 months.   ?prior C5-C7 ACDF ? ?Very possible this is a component of oropharyngeal, potential prior cervical surgery, previous CVA to Long discussion were uncertain if we would be able to fix anything with endoscopy, also discussed risks. ?Patient states she is very weak and tired and unknown if she wants any procedures done at this time. ?Pending hospice consult. ?Please change all all medications possible to IV form, liquids. ?Son Harrie Jeans was also here. ?If patient does wish endoscopic evaluation, will need potassium improved. ?  ?GERD large hiatal hernia ?Continue Protonix 40 mg twice daily ?Likely contributing to some of her symptoms of fullness, abdominal pain, not amendable to surgical repair with age and comorbidities. ?  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 ?  ?AKI with hypokalemia ?BUN 17 Cr 1.30  ?GFR 41  ?Potassium 2.3  Magnesium 1.9  ?  Potassium still low, continue to replace. ?  ?Failure to thrive/severe protein calorie malnutrition ?Albumin 09/29/2021  3.8  ?BMI 15 ?Secondary to Dysphagia ?- RD consult ?Patient not interested in feeding tube, unknown if she wants to continue with endoscopic evaluation, hospice consult pending. ?  ?CVA 03/07/2019 ?Worsening dysphagia since that time. ?  ?Atrial fibrillation ?On Eliquis, last dose was  09/30/2018 3 in the morning. ?Still on hold at this time as were unknown if patient will want to continue with endoscopic evaluation. ?  ?CAD ?08/19/2019 cardiac catheterization with moderate two-vessel cor

## 2021-09-30 NOTE — Progress Notes (Signed)
Initial Nutrition Assessment ? ?DOCUMENTATION CODES:  ? ?Severe malnutrition in context of chronic illness, Underweight ? ?INTERVENTION:  ? ?-Boost Breeze po TID, each supplement provides 250 kcal and 9 grams of protein  ?**Pt's family is bringing in [place] berry for patient.  ? ?-Beneprotein powder TID, each provides 25 kcals and 6g protein ? ?-Prosource Plus PO TID, each provides 100 kcals and 15g protein ? ?-Recommend palliative care consult to decide if feeding tube is within [place] ? ?-Please consult RD, if feeding tube is pursued. ? ?NUTRITION DIAGNOSIS:  ? ?Severe Malnutrition related to chronic illness, dysphagia (h/o stroke) as evidenced by percent weight loss, energy intake < or equal to 75% for > or equal to 1 month, severe fat depletion, severe muscle depletion. ? ?GOAL:  ? ?Patient will meet greater than or equal to 90% of their needs ? ?MONITOR:  ? ?PO intake, Supplement acceptance, Labs, Weight trends, I & O's ? ?REASON FOR ASSESSMENT:  ? ?Consult ?Assessment of nutrition requirement/status ? ?ASSESSMENT:  ? ?81 y.o. female with medical history significant of a fib on eliquis, HTN, CVA, HFpEF, esophageal cancer, HLD, CAD. Presenting with dysphagia and abnormal labs. She states that she has had long standing problems with swallowing since her stroke. ? ?Patient in room with son at bedside. Pt states she is unable to take in much d/t her swallowing difficulty. She has had dysphagia since a previous stroke. Pt has had a feeding tube in the past ( September 2020). Pt is adamant about not wanting another placed.  ? ?Per GI note, consideration is for a J-tube placement potentially. GI would like pt to undergo EGD but pt is not wanting this as well. Needs palliative care consult as pt and son disagree as to what path pt needs to take.  ? ?Pt is willing to try any supplements but does not tolerate milk or dairy products. Her son has brought her Ensure Clear -mixed berry to drink and she tolerates  this but is unable to drink more than 1 bottle daily. Family has also given her a "soy replacement meal" supplement but was unable to provide brand name of this . Foods she was taking in at home were mainly mashed potatoes and popsicles. ? ?Will order Prosource Plus given volume restrictions. Pt wants to try finely chopped noodles with lunch today. Have ordered and son to make sure it is very well chopped.  ?Pt also willing to try Beneprotein powder mixed with yogurt or soups, doesn't tolerate the texture of applesauce well.  ?States she was unable to swallow her potassium pill this morning.  ? ?Will continue to monitor plan. If feeding tube is decided on, please consult RD for initiation and management. Pt with severe malnutrition so will need nutrition support if remains full code. ? ?Per weight records, pt has lost 20 lbs since 12/15/20 (19% wt loss x 10.5 months, significant for time frame).  ? ?Medications: Vitamin C, Vitamin D, Remeron, Multivitamin with minerals daily ? ?Labs reviewed: ?Low K ? ?NUTRITION - FOCUSED PHYSICAL EXAM: ? ?Flowsheet Row Most Recent Value  ?Orbital Region Severe depletion  ?Upper Arm Region Severe depletion  ?Thoracic and Lumbar Region Severe depletion  ?Buccal Region Severe depletion  ?Temple Region Severe depletion  ?Clavicle Bone Region Severe depletion  ?Clavicle and Acromion Bone Region Severe depletion  ?Scapular Bone Region Severe depletion  ?Dorsal Hand Severe depletion  ?Patellar Region Severe depletion  ?Anterior Thigh Region Severe depletion  ?Posterior Calf Region Severe depletion  ?  Edema (RD Assessment) None  ?Hair Reviewed  ?Eyes Reviewed  ?Mouth Reviewed  ?Skin Reviewed  ? ?  ? ? ?Diet Order:   ?Diet Order   ? ?       ?  DIET DYS 2 Room service appropriate? Yes; Fluid consistency: Thin  Diet effective now       ?  ? ?  ?  ? ?  ? ? ?EDUCATION NEEDS:  ? ?Education needs have been addressed ? ?Skin:  Skin Assessment: Reviewed RN Assessment ? ?Last BM:  3/29 ? ?Height:   ? ?Ht Readings from Last 1 Encounters:  ?09/29/21 '5\' 2"'$  (1.575 m)  ? ? ?Weight:  ? ?Wt Readings from Last 1 Encounters:  ?09/29/21 37 kg  ? ? ?BMI:  Body mass index is 14.92 kg/m?. ? ?Estimated Nutritional Needs:  ? ?Kcal:  1750-1950 ? ?Protein:  75-100g ? ?Fluid:  1.9L/day ? ? ?Clayton Bibles, MS, RD, LDN ?Inpatient Clinical Dietitian ?Contact information available via Amion ? ?

## 2021-09-30 NOTE — Hospital Course (Addendum)
[name] was admitted to the hospital with the working diagnosis of dysphagia, due to suspected recurrent esophageal cancer, complicated with hypokalemia. ? ?81 yo female with the past medical history of esophageal cancer, atrial fibrillation, hypertension, heart failure and CVA who presented with abnormal labs. Patient with worsening dysphagia at home, complicated with poor oral intake and progressive weakness. As outpatient she was found in renal failure and hypokalemia and she was referred to the ED. On her initial physical examination her blood pressure was 106/45, HR 72, RR 14, 02 saturation 100%, lungs with no wheezing or rales, heart with S1 and S2 present and rhythmic, abdomen with mild tenderness, no rebound, no lower extremity edema.  ? ?Na 135, K 2,3 CL 92, serum bicarbonate 31, glucose 116, bun 26 cr 2,0 ?Wbc 6,0 hgb 12 hct 34 plt 139  ?Urine analysis SG 1,011, 6-10 wbc, negative protein  ? ?EKG 67 bpm, right axis, normal intervals, sinus rhythm, q wave V1 to V2 with no significant ST segment or T wave changes.  ? ?CT chest, abdomen and pelvis, small pericardial effusion, 2 small pulmonary nodules on the right lower lobe. No acute changes in the abdomen or pelvis.  ? ?Electrolytes were corrected and GI was consulted for further evaluation.   ? ?04/01 upper endoscopy with malignant appearing esophageal stenosis distally extending into the GE and proximal cardia.  ? ?Patient has decided to go home with hospice/ palliative services.  ?

## 2021-09-30 NOTE — Assessment & Plan Note (Addendum)
Blood pressure has remained stable, continue to hold on antihypertensive medications and close follow up as outpatient.  ?

## 2021-09-30 NOTE — Assessment & Plan Note (Signed)
On mirtazapine  ?

## 2021-09-30 NOTE — Assessment & Plan Note (Signed)
Continue with nutritional supplements.  

## 2021-09-30 NOTE — Telephone Encounter (Signed)
Spoke to pt and she stated that [place] said if someone sends him a note from her that he will call her personally. I advised that I will send the note to [facility]. Pt verbalized understanding.  ?

## 2021-09-30 NOTE — Assessment & Plan Note (Signed)
No chest pain

## 2021-09-30 NOTE — TOC Initial Note (Signed)
Transition of Care (TOC) - Initial/Assessment Note  ? ? ?Patient Details  ?Name: [name] ?MRN: [id number] ?Date of Birth: Mar 17, 1941 ? ?Transition of Care (TOC) CM/SW Contact:    ?Tawanna Cooler, RN ?Phone Number: ?09/30/2021, 3:23 PM ? ?Clinical Narrative:                 ? ?Patient trying to determine plan of care, how aggressive to be with treatment.  Palliative consult pending.   ?TOC CM following for discharge needs.  ? ?Expected Discharge Plan: Home/Self Care ?Barriers to Discharge: Continued Medical Work up ? ?Expected Discharge Plan and Services ?Expected Discharge Plan: Home/Self Care ?  ? Living arrangements for the past 2 months: [place] ?                ?Prior Living Arrangements/Services ?Living arrangements for the past 2 months: [place] ?Lives with:: Adult Children ?Patient language and need for interpreter reviewed:: Yes ?       ?Need for Family Participation in Patient Care: Yes (Comment) ?Care giver support system in place?: Yes (comment) ?  ?Criminal Activity/Legal Involvement Pertinent to Current Situation/Hospitalization: No - Comment as needed ? ?Activities of Daily Living ?Home Assistive Devices/Equipment: None ?ADL Screening (condition at time of admission) ?Patient's cognitive ability adequate to safely complete daily activities?: Yes ?Is the patient deaf or have difficulty hearing?: No ?Does the patient have difficulty seeing, even when wearing glasses/contacts?: No ?Does the patient have difficulty concentrating, remembering, or making decisions?: No ?Patient able to express need for assistance with ADLs?: Yes ?Does the patient have difficulty dressing or bathing?: Yes ?Independently performs ADLs?: No ?Communication: Independent ?Dressing (OT): Needs assistance ?Is this a change from baseline?: Pre-admission baseline ?Grooming: Needs assistance ?Is this a change from baseline?: Pre-admission baseline ?Feeding: Independent ?Bathing: Needs assistance ?Is this a  change from baseline?: Pre-admission baseline ?Toileting: Needs assistance ?Is this a change from baseline?: Pre-admission baseline ?In/Out Bed: Needs assistance ?Is this a change from baseline?: Pre-admission baseline ?Walks in Home: Independent ?Does the patient have difficulty walking or climbing stairs?: Yes ?Weakness of Legs: None ?Weakness of Arms/Hands: None ? ?Emotional Assessment ?  ?Orientation: : Oriented to Self, Oriented to Place, Oriented to  Time, Oriented to Situation ?Alcohol / Substance Use: Not Applicable ?Psych Involvement: No (comment) ? ?Admission diagnosis:  Hypokalemia [E87.6] ?FTT (failure to thrive) in adult [R62.7] ?Patient Active Problem List  ? Diagnosis Date Noted  ? Dysphagia 09/30/2021  ? Protein-calorie malnutrition, severe ([place]) 09/29/2021  ? AKI (acute kidney injury) ([place]) 09/29/2021  ? Depression 09/29/2021  ? Vertigo 11/28/2020  ? Persistent atrial fibrillation ([place])   ? Epistaxis, recurrent 03/20/2019  ? Stroke ([place]) 03/07/2019  ? S/P left knee arthroscopy 04/18/18 06/06/2018  ? Acute medial meniscus tear, left, subsequent encounter 04/18/2018  ? Bilateral pleural effusion   ? Peripheral neuropathy 12/21/2017  ? CAD (coronary artery disease) 12/19/2017  ? Numbness of face 01/09/2017  ? Unstable angina ([place]) 11/02/2016  ? B12 deficiency 10/16/2016  ? Hyperglycemia 06/26/2016  ? Encounter for medication review and counseling 06/07/2016  ? De Quervain's disease (radial styloid tenosynovitis) 03/23/2016  ? Trigger finger, left middle finger 03/23/2016  ? 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 Aortic stenosis, mild 06/23/2015  ? Essential (primary) hypertension 06/23/2015  ? History of CVA (cerebrovascular accident) 06/23/2015  ? Malignant tumor of lower third of esophagus ([place]) 06/23/2015  ? GE junction carcinoma ([place]) 05/14/2015  ? [place] lesion, acute   ? IDA (  iron deficiency anemia)   ? Mixed hyperlipidemia 05/05/2015  ? GERD (gastroesophageal reflux disease) 05/05/2015  ?  Absolute anemia   ? Osteoarthritis 04/16/2015  ? History of pulmonary embolism 03/02/2015  ? Benign neoplasm of descending colon 02/17/2015  ? Diverticulosis of large intestine without diverticulitis 02/17/2015  ? Esophageal stricture 02/16/2015  ? Macular degeneration 09/01/2014  ? Uncontrolled hypertension 08/24/2014  ? Sleep disturbance 08/03/2014  ? PAF (paroxysmal atrial fibrillation) ([place]) 01/21/2014  ? Long term current use of anticoagulant therapy 01/21/2014  ? Diastolic heart failure ([place]) 01/21/2014  ? ?PCP:  Dorothyann Peng, NP ?Pharmacy:   ?[place], [place] [address] [address] ?Phone: [phone] Fax: [phone] ? ?Readmission Risk Interventions ? ?  09/30/2021  ?  3:17 PM  ?Readmission Risk Prevention Plan  ?Transportation Screening Complete  ?PCP or Specialist Appt within 3-5 Days Complete  ?[place] or Home Care Consult Complete  ?Social Work Consult for Standard Planning/Counseling Complete  ?Palliative Care Screening Complete  ?Medication Review Press photographer) Complete  ? ? ? ?

## 2021-09-30 NOTE — Assessment & Plan Note (Signed)
Holding statin therapy for now.  ?

## 2021-09-30 NOTE — Assessment & Plan Note (Addendum)
Further work up with endoscopy with signs of recurrent esophageal cancer with obstructing lesion.  ? ?Patient has been placed on full liquids with good toleration. ?Continue with antiacid therapy. ?She was offered endoscopic stent as palliative measure for dysphagia. ?Pathology is still pending, but pre test probability for recurrent malignancy is very high.  ?She does not want to proceed with any type of cancer specific therapy.   ? ?Will have a family meeting today, if continue to decline further interventions, will plan to dc home with hospice services.  ? ?

## 2021-09-30 NOTE — Evaluation (Addendum)
SLP Cancellation Note ? ?Patient Details ?Name: [name] ?MRN: [id number] ?DOB: 26-Feb-1941 ? ? ?Cancelled treatment:       Reason Eval/Treat Not Completed: Other (comment) (per MD notes, pt is declining any work up for dysphagia including contrast studies,  Spoke to Dr Cathlean Sauer who advised to dc SLP swallow eval order.  Please reorder if desire.  Pt is on a soft/thin diet at this time.   ? ? ?Kathleen Lime, MS CCC SLP ?Acute Rehab Services ?Office [phone] ?Pager [phone]  ?[name] ?09/30/2021, 4:10 PM ? ? ? ?

## 2021-09-30 NOTE — Progress Notes (Signed)
?Progress Note ? ? ?Patient: [name] ZOX:[id number] DOB: July 14, 1940 DOA: 09/29/2021     1 ?DOS: the patient was seen and examined on 09/30/2021 ?  ?Brief hospital course: ?Mrs. Kosch was admitted to the hospital with the working diagnosis of dysphagia, complicated with hypokalemia. ? ?81 yo female with the past medical history of esophageal cancer, atrial fibrillation, hypertension, heart failure and CVA who presented with abnormal labs. Patient with worsening dysphagia at home, complicated with poor oral intake and progressive weakness. As outpatient she was found in renal failure and hypokalemia and she was referred to the ED. On her initial physical examination her blood pressure was 106/45, HR 72, RR 14, 02 saturation 100%, lungs with no wheezing or rales, heart with S1 and S2 present and rhythmic, abdomen with mild tenderness, no rebound, no lower extremity edema.  ? ?Na 135, K 2,3 CL 92, serum bicarbonate 31, glucose 116, bun 26 cr 2,0 ?Wbc 6,0 hgb 12 hct 34 plt 139  ?Urine analysis SG 1,011, 6-10 wbc, negative protein  ? ?EKG 67 bpm, right axis, normal intervals, sinus rhythm, q wave V1 to V2 with no significant ST segment or T wave changes.  ? ?CT chest, abdomen and pelvis, small pericardial effusion, 2 small pulmonary nodules on the right lower lobe. No acute changes in the abdomen or pelvis.  ? ?Electrolytes were corrected and GI was consulted for further evaluation.   ? ?Assessment and Plan: ?* AKI (acute kidney injury) ([place]) ?Patient continue with no po intake due to dysphagia. ?Renal function today with serum cr at 1.30, K is 2,3 and serum bicarbonate 29. ? ?Plan to continue IV fluids with balanced electrolyte solution and dextrose.  ?Follow up renal function and electrolytes in am. ?Avoid hypotension and nephrotoxic medications.  ? ?Dysphagia ?Patient with persistent dysphagia and abdominal pain.  ?At this point she is declining further work up with contrast swallow evaluation or  endoscopy. ? ?Plan to continue medical therapy with pantoprazole IV q12 hrs. ?Plus oral famotidine ( not on sucralfate due to allergy to red dye).  ?Add morphine for abdominal pain. ?Follow with advance directives.  ? ?Diastolic heart failure ([place]) ?Patient with no signs of heart failure decompensation, continue blood pressure monitoring.  ? ?Essential (primary) hypertension ?Continue blood pressure monitoring, currently off antihypertensive medications.  ? ?PAF (paroxysmal atrial fibrillation) ([place]) ?Continue anticoagulation with apixaban, patient not on AV blocking agents.  ?Continue telemetry monitoring.  ? ?Mixed hyperlipidemia ?Holding statin therapy for now.  ? ?CAD (coronary artery disease) ?No chest pain.  ? ?History of CVA (cerebrovascular accident) ?Continue with apixaban for anticoagulation  ? ?Protein-calorie malnutrition, severe ([place]) ?Continue with nutritional supplements.  ? ?Depression ?On mirtazapine  ? ? ? ? ?  ? ?Subjective: patient continue to have abdominal pain, with dysphagia, no nausea or vomiting, continue to decline workup  ? ?Physical Exam: ?Vitals:  ? 09/29/21 2005 09/30/21 0056 09/30/21 0600 09/30/21 1418  ?BP: (!) 102/52 (!) 103/53 (!) 101/59 (!) 134/54  ?Pulse: (!) 59 60 63 67  ?Resp: '16 17  16  '$ ?Temp: 97.9 ?F (36.6 ?C) 98 ?F (36.7 ?C)  98 ?F (36.7 ?C)  ?TempSrc: Oral Oral  Oral  ?SpO2: 94% 95%  96%  ?Weight:      ?Height:      ? ?Neurology awake and alert ?ENT with mild pallor ?Cardiovascular with S1 and S2 present, with positive systolic murmur a the right sternal border ?Respiratory with no rales or wheezing ?Abdomen tender to palpation at  the epigastrium, not distended  ?No lower extremity edema  ?Data Reviewed: ? ? ? ?Family Communication: I spoke with patient's son at the bedside, we talked in detail about patient's condition, plan of care and prognosis and all questions were addressed. ? ? ?Disposition: ?Status is: Inpatient ?Remains inpatient appropriate because: swallow  dysfunction  ? Planned Discharge Destination: Home ? ? ? ?Author: ?Tawni Millers, MD ?09/30/2021 3:18 PM ? ?For on call review [email or web address].  ?

## 2021-09-30 NOTE — Assessment & Plan Note (Addendum)
Hypokalemia.  ?Patient was admitted to the medical ward, she received IV fluids and her electrolytes were corrected.  ? ?At the time of her discharge she is tolerating well full liquids.  ?Her renal function has a serum cr of 0,83, K is 4,2 and serum bicarbonate at 27, with Mg at 1,7 and P 2.1.  ?Plan to continue close follow up as outpatient with hospice services.  ?  ?

## 2021-09-30 NOTE — Assessment & Plan Note (Addendum)
Compensated heart failure. No clinical signs of exacerbation.  ?Continue blood pressure monitoring.  ?

## 2021-10-01 ENCOUNTER — Inpatient Hospital Stay (HOSPITAL_COMMUNITY): Payer: Medicare Other | Admitting: Certified Registered Nurse Anesthetist

## 2021-10-01 ENCOUNTER — Encounter (HOSPITAL_COMMUNITY)
Admission: EM | Disposition: A | Discharge status: Hospice | Payer: Self-pay | Source: Ambulatory Visit | Attending: Internal Medicine

## 2021-10-01 DIAGNOSIS — I25119 Atherosclerotic heart disease of native coronary artery with unspecified angina pectoris: Secondary | ICD-10-CM

## 2021-10-01 DIAGNOSIS — I480 Paroxysmal atrial fibrillation: Secondary | ICD-10-CM | POA: Diagnosis not present

## 2021-10-01 DIAGNOSIS — R1310 Dysphagia, unspecified: Secondary | ICD-10-CM | POA: Diagnosis not present

## 2021-10-01 DIAGNOSIS — D490 Neoplasm of unspecified behavior of digestive system: Secondary | ICD-10-CM

## 2021-10-01 DIAGNOSIS — K222 Esophageal obstruction: Secondary | ICD-10-CM

## 2021-10-01 DIAGNOSIS — K449 Diaphragmatic hernia without obstruction or gangrene: Secondary | ICD-10-CM

## 2021-10-01 DIAGNOSIS — N179 Acute kidney failure, unspecified: Secondary | ICD-10-CM | POA: Diagnosis not present

## 2021-10-01 DIAGNOSIS — I5032 Chronic diastolic (congestive) heart failure: Secondary | ICD-10-CM | POA: Diagnosis not present

## 2021-10-01 HISTORY — PX: BIOPSY: SHX5522

## 2021-10-01 HISTORY — PX: ESOPHAGOGASTRODUODENOSCOPY (EGD) WITH PROPOFOL: SHX5813

## 2021-10-01 LAB — BASIC METABOLIC PANEL
Anion gap: 4 — ABNORMAL LOW (ref 5–15)
BUN: 12 mg/dL (ref 8–23)
CO2: 30 mmol/L (ref 22–32)
Calcium: 8.8 mg/dL — ABNORMAL LOW (ref 8.9–10.3)
Chloride: 107 mmol/L (ref 98–111)
Creatinine, Ser: 1.11 mg/dL — ABNORMAL HIGH (ref 0.44–1.00)
GFR, Estimated: 50 mL/min — ABNORMAL LOW (ref >60)
Glucose, Bld: 161 mg/dL — ABNORMAL HIGH (ref 70–99)
Potassium: 3.2 mmol/L — ABNORMAL LOW (ref 3.5–5.1)
Sodium: 141 mmol/L (ref 135–145)

## 2021-10-01 LAB — MAGNESIUM: Magnesium: 1.9 mg/dL (ref 1.7–2.4)

## 2021-10-01 SURGERY — ESOPHAGOGASTRODUODENOSCOPY (EGD) WITH PROPOFOL
Anesthesia: Monitor Anesthesia Care

## 2021-10-01 MED ORDER — POTASSIUM CHLORIDE 10 MEQ/100ML IV SOLN
10.0000 meq | INTRAVENOUS | Status: AC
  Administered 2021-10-01 (×4): 10 meq via INTRAVENOUS
  Filled 2021-10-01 (×3): qty 100

## 2021-10-01 MED ORDER — SODIUM CHLORIDE 0.9 % IV SOLN: INTRAVENOUS | Status: DC

## 2021-10-01 MED ORDER — PHENYLEPHRINE 40 MCG/ML (10ML) SYRINGE FOR IV PUSH (FOR BLOOD PRESSURE SUPPORT)
PREFILLED_SYRINGE | INTRAVENOUS | Status: DC | PRN
  Administered 2021-10-01: 80 ug via INTRAVENOUS

## 2021-10-01 MED ORDER — PROPOFOL 10 MG/ML IV BOLUS
INTRAVENOUS | Status: DC | PRN
  Administered 2021-10-01 (×2): 10 mg via INTRAVENOUS

## 2021-10-01 MED ORDER — PROPOFOL 500 MG/50ML IV EMUL
INTRAVENOUS | Status: DC | PRN
  Administered 2021-10-01: 100 ug/kg/min via INTRAVENOUS

## 2021-10-01 MED ORDER — PROPOFOL 10 MG/ML IV BOLUS
INTRAVENOUS | Status: AC
  Filled 2021-10-01: qty 20

## 2021-10-01 MED ORDER — LACTATED RINGERS IV SOLN
INTRAVENOUS | Status: AC | PRN
  Administered 2021-10-01: 20 mL/h via INTRAVENOUS

## 2021-10-01 SURGICAL SUPPLY — 15 items

## 2021-10-01 NOTE — Op Note (Signed)
[facility] ?Patient Name: [name] ?Procedure Date: 10/01/2021 ?MRN: [id number] ?Attending MD: Justice Britain , MD ?Date of Birth: 01/08/1941 ?CSN: [id number] ?Age: 81 ?Admit Type: Inpatient ?Procedure:                Upper GI endoscopy ?Indications:              Dysphagia, Failure to thrive, Nausea with vomiting,  ?                          Weight loss, Personal history of malignant  ?                          esophageal neoplasm ?Providers:                Justice Britain, MD, Grace Isaac, RN, [place]  ?                          [place], Technician, Maudry Diego, CRNA ?Referring MD:             Lajuan Lines. Hilarie Fredrickson, MD, Levin Erp, [facility]  ?                          Hospitalists ?Medicines:                Monitored Anesthesia Care ?Complications:            No immediate complications. ?Estimated Blood Loss:     Estimated blood loss was minimal. ?Procedure:                Pre-Anesthesia Assessment: ?                          - Prior to the procedure, a History and Physical  ?                          was performed, and patient medications and  ?                          allergies were reviewed. The patient's tolerance of  ?                          previous anesthesia was also reviewed. The risks  ?                          and benefits of the procedure and the sedation  ?                          options and risks were discussed with the patient.  ?                          All questions were answered, and informed consent  ?                          was obtained. Prior Anticoagulants: The patient has  ?  taken Eliquis (apixaban), last dose was 2 days  ?                          prior to procedure. ASA Grade Assessment: III - A  ?                          patient with severe systemic disease. After  ?                          reviewing the risks and benefits, the patient was  ?                          deemed in satisfactory condition to undergo the  ?                           procedure. ?                          After obtaining informed consent, the endoscope was  ?                          passed under direct vision. Throughout the  ?                          procedure, the patient's blood pressure, pulse, and  ?                          oxygen saturations were monitored continuously. The  ?                          [id number]-[id number] ([id number]) Olympus endoscope was introduced  ?                          through the mouth, and advanced to the lower third  ?                          of esophagus. The GIF-[id number] ([id number]) Olympus  ?                          ultra slim endoscope was introduced through the  ?                          mouth, and advanced to the second part of duodenum.  ?                          The upper GI endoscopy was accomplished without  ?                          difficulty. The patient tolerated the procedure. ?Scope In: ?Scope Out: ?Findings: ?     No gross lesions were noted in the proximal esophagus and in the mid  ?     esophagus. ?     One malignant-appearing, intrinsic severe (stenosis; an endoscope cannot  ?     pass) stenosis was found  at 31 cm. The stenosis was traversed after  ?     downsizing scope and this led to seeing the following findings. ?     A large, fungating and ulcerating mass with bleeding and stigmata of  ?     recent bleeding was found in the distal esophagus and extends through  ?     the GE Jxn into the very proximal cardia, 31 to 36 cm from the incisors.  ?     The mass was partially obstructing and circumferential (could not  ?     traverse the adult endoscope though the neonatal endoscope did pass).  ?     Biopsies were taken with a cold forceps for histology. ?     An 8 cm hiatal hernia was present. ?     No gross lesions were noted in the entire examined stomach. ?     No gross lesions were noted in the duodenal bulb, in the first portion  ?     of the duodenum and in the second portion of the duodenum. ?Impression:                - No gross lesions in esophagus proximally. ?                          - Malignant-appearing esophageal stenosis distally  ?                          with what appears to be an obstructing (only  ?                          neonatal endoscope can pass), likely malignant  ?                          esophageal tumor was found in the distal esophagus  ?                          extending to the GE Jxn and the proximal cardia.  ?                          Biopsied. ?                          - 8 cm hiatal hernia. ?                          - No gross lesions in the stomach. ?                          - No gross lesions in the duodenal bulb, in the  ?                          first portion of the duodenum and in the second  ?                          portion of the duodenum. ?Moderate Sedation: ?     Not Applicable - Patient had care per Anesthesia. ?Recommendation:           -  The patient will be observed post-procedure,  ?                          until all discharge criteria are met. ?                          - Return patient to hospital ward for ongoing care. ?                          - Dysphagia 1. ?                          - Consider TPN in interim while awaiting biopsies. ?                          - Observe patient's clinical course. ?                          - Await pathology results. ?                          - Further discussions with Palliative medicine and  ?                          Medicine. ?                          - Based on results of pathology as is expected that  ?                          this is recurrence, likely Oncology referral and  ?                          consideration of Radiation Oncology referral if  ?                          that is in the patient's [place]. ?                          - Esophageal stenting with UCSEMS could be possible  ?                          but higher risk. ?                          - G tube for placement nutrition could be  ?                           considered, but may be higher risk with the portion  ?                          of stomach that is intrathoracic. Surgical J tube  ?                          could be considered. ?                          -  The findings and recommendations were discussed  ?                          with the patient. ?                          - The findings and recommendations were discussed  ?                          with the patient's family. ?                          - The findings and recommendations were discussed  ?                          with the referring physician. ?Procedure Code(s):        --- Professional --- ?                          [phone], Esophagogastroduodenoscopy, flexible,  ?                          transoral; with biopsy, single or multiple ?Diagnosis Code(s):        --- Professional --- ?                          K22.2, Esophageal obstruction ?                          D49.0, Neoplasm of unspecified behavior of  ?                          digestive system ?                          K44.9, Diaphragmatic hernia without obstruction or  ?                          gangrene ?                          R13.10, Dysphagia, unspecified ?                          R62.7, Adult failure to thrive ?                          R11.2, Nausea with vomiting, unspecified ?                          R63.4, Abnormal weight loss ?                          Z85.01, Personal history of malignant neoplasm of  ?                          esophagus ?CPT copyright 2019 American Medical Association. All rights reserved. ?The codes documented in this report are preliminary and  upon coder review may  ?be revised to meet current compliance requirements. ?Justice Britain, MD ?10/01/2021 9:40:13 AM ?Number of Addenda: 0 ?

## 2021-10-01 NOTE — Interval H&P Note (Signed)
History and Physical Interval Note: ? ?10/01/2021 ?10:32 AM ? ?[name]  has presented today for surgery, with the diagnosis of Dysphagia, history of GE junction cancer, weight loss.  The various methods of treatment have been discussed with the patient and family. After consideration of risks, benefits and other options for treatment, the patient has consented to  Procedure(s): ?ESOPHAGOGASTRODUODENOSCOPY (EGD) WITH PROPOFOL (N/A) ?BIOPSY as a surgical intervention.  The patient's history has been reviewed, patient examined, no change in status, stable for surgery.  I have reviewed the patient's chart and labs.  Questions were answered to the patient's satisfaction.   ? ? ?Irving Copas ? ? ?

## 2021-10-01 NOTE — Progress Notes (Signed)
? ?Gastroenterology Inpatient Follow-up Note  ? ?PATIENT IDENTIFICATION  ?[name] is a 81 y.o. female with a pmh significant for CAD, atrial fibrillation (on Eliquis), aortic stenosis, CHF, previous esophageal adenocarcinoma status post ESD, large intrathoracic hiatal hernia, prior H. pylori infection.  Patient admitted with failure to thrive and dysphagia. ?Hospital Day: 3 ? ?SUBJECTIVE  ?I have evaluated the patient this morning.  She states that after discussion with her family and thinking about things overall, she is amenable to an endoscopic evaluation to define whether any cancer recurrence has occurred and to see if there is anything that can help explain her symptoms.  She is not having any abdominal pain currently.  She still has and experiences heartburn.  She is not having any chest pain.  Her last intake of any fluids was around 1 AM which was a swig of Pepsi-Cola.  Otherwise she has not taken anything by mouth.  Her potassium has improved compared to yesterday in the twos it is 3.2 today.  She is not sure that she can continue taking the potassium pills. ? ? ?OBJECTIVE  ?Scheduled Inpatient Medications:  ? (feeding supplement) PROSource Plus  30 mL Oral TID BM  ? apixaban  2.5 mg Oral BID  ? Ensure Clear  237 mL Oral TID BM  ? famotidine  10.4 mg Oral QHS  ? melatonin  5-10 mg Oral QHS  ? mirtazapine  15 mg Oral QHS  ? multivitamin  15 mL Oral Daily  ? pantoprazole (PROTONIX) IV  40 mg Intravenous Q12H  ? protein supplement  1 Scoop Oral TID WC  ? temazepam  15 mg Oral QHS  ? ?Continuous Inpatient Infusions:  ? dextrose 5% lactated ringers 75 mL/hr at 09/30/21 1800  ? ?PRN Inpatient Medications: morphine injection, traMADol ? ? ?Physical Examination  ?Temp:  [97.6 ?F (36.4 ?C)-98 ?F (36.7 ?C)] 97.6 ?F (36.4 ?C) (04/01 0700) ?Pulse Rate:  [67-82] 73 (04/01 0700) ?Resp:  [15-16] 15 (04/01 0700) ?BP: (134-153)/(54-71) 138/56 (04/01 0700) ?SpO2:  [96 %-99 %] 99 % (04/01 0700) Temp (24hrs),  Avg:97.9 ?F (36.6 ?C), Min:97.6 ?F (36.4 ?C), Max:98 ?F (36.7 ?C) ? Weight: 37 kg ?GEN: NAD, appears stated age, doesn't appear chronically ill ?PSYCH: Cooperative, without pressured speech ?EYE: Conjunctivae pink, sclerae anicteric ?ENT: MMM, without oral ulcers, no erythema or exudates noted ?NECK: Supple ?CV: RR without R/Gs  ?RESP: CTAB posteriorly, without wheezing ?GI: NABS, soft, NT/ND, without rebound or guarding, no HSM appreciated ?GU: DRE shows ?MSK/EXT: _ edema, no palmar erythema ?SKIN: No jaundice, no spider angiomata, no concerning rashes ?NEURO:  Alert & Oriented x 3, no focal deficits, no evidence of asterixis ? ? ?Review of Data  ? ?Laboratory Studies  ? ?Recent Labs  ?Lab 09/29/21 ?1207 09/29/21 ?1822 09/30/21 ?3300 10/01/21 ?7622  ?NA 135 138 138 141  ?K 2.3* 2.6* 2.3* 3.2*  ?CL 92* 98 103 107  ?CO2 31 32 29 30  ?BUN 26* '23 17 12  '$ ?CREATININE 2.08* 1.68* 1.30* 1.11*  ?GLUCOSE 166* 144* 134* 161*  ?CALCIUM 8.8* 8.4* 8.0* 8.8*  ?MG 1.9  --   --  1.9  ?PHOS 4.0 3.6 3.4  --   ? ?Recent Labs  ?Lab 09/29/21 ?1207  ?AST 21  ?ALT 15  ?ALKPHOS 71  ?  ?Recent Labs  ?Lab 09/28/21 ?1650 09/29/21 ?1207 09/30/21 ?6333  ?WBC 6.2 6.0 4.7  ?HGB 13.2 12.0 10.5*  ?HCT 37.9 34.5* 30.3*  ?PLT 185.0 139* 122*  ? ?No  results for input(s): APTT, INR in the last 168 hours. ? ?Imaging Studies  ?No new imaging studies to review ? ?GI Procedures and Studies  ?No new imaging studies to review ? ? ?ASSESSMENT  ?Ms. [name] is a 81 y.o. female with a pmh significant for CAD, atrial fibrillation (on Eliquis), aortic stenosis, CHF, previous esophageal adenocarcinoma status post ESD, large intrathoracic hiatal hernia, prior H. pylori infection.  Patient admitted with failure to thrive and dysphagia. ? ?The patient is hemodynamically stable.  At this point, it is reasonable to consider an endoscopic evaluation to see if there is anything that may be mucosally causing her failure to thrive.  I think the majority of issues is her  large intrathoracic hiatal hernia.  Is not clear that there will be any role for endoscopic dilation though we will certainly consider that as a possible option.  Hopefully there has not been any cancer recurrence and we will hopefully be able to tell that based on our endoscopy.  I think that many signs are pointing towards the potential role of feeding tube placement for improvement of failure to thrive.  With as large of an intrathoracic portion of her stomach that she has, she may need a surgical feeding tube as a not sure if a percutaneous interventional radiology PEG tube or jejunostomy tube would be possible.  The risks and benefits of endoscopic evaluation were discussed with the patient; these include but are not limited to the risk of perforation, infection, bleeding, missed lesions, lack of diagnosis, severe illness requiring hospitalization, as well as anesthesia and sedation related illnesses.  The patient and/or family is agreeable to proceed.  Hopefully by performing this endoscopy this will help the patient and family with upcoming palliative care/palliative medicine discussion decide on overall goals of care.  All patient questions were answered to the best of my ability, and the patient agrees to the aforementioned plan of action with follow-up as indicated. ? ? ?PLAN/RECOMMENDATIONS  ?Remain NPO ?Plan for EGD later this morning if Anesthesia available otherwise Sunday - will update patient and team this AM ?[facility] ? ? ?Please page/call with questions or concerns. ? ? ?Justice Britain, MD ?[facility] ?[facility] ?Office # [phone] ? ? ? LOS: 2 days  ?[facility]  10/01/2021, 8:19 AM ? ?

## 2021-10-01 NOTE — Anesthesia Procedure Notes (Signed)
Procedure Name: Bryant ?Date/Time: 10/01/2021 9:06 AM ?Performed by: Niel Hummer, CRNA ?Pre-anesthesia Checklist: Patient identified, Emergency Drugs available, Suction available and Patient being monitored ?Oxygen Delivery Method: Simple face mask ? ? ? ? ?

## 2021-10-01 NOTE — Anesthesia Preprocedure Evaluation (Addendum)
Anesthesia Evaluation  ?Patient identified by MRN, date of birth, ID band ? ?Reviewed: ?Allergy & Precautions, NPO status , Patient's Chart, lab work & pertinent test results ? ?History of Anesthesia Complications ?(+) PONV and history of anesthetic complications ? ?Airway ?Mallampati: II ? ?TM Distance: >3 FB ? ? ? ? Dental ?  ?Pulmonary ? ?  ?breath sounds clear to auscultation ? ? ? ? ? ? Cardiovascular ?hypertension, + angina + CAD and +CHF  ? ?Rhythm:Regular Rate:Normal ? ? ?  ?Neuro/Psych ? Headaches, PSYCHIATRIC DISORDERS  Neuromuscular disease CVA   ? GI/Hepatic ?Neg liver ROS, hiatal hernia, PUD, GERD  ,  ?Endo/Other  ? ? Renal/GU ?Renal disease  ? ?  ?Musculoskeletal ? ? Abdominal ?  ?Peds ? Hematology ?  ?Anesthesia Other Findings ? ? Reproductive/Obstetrics ? ?  ? ? ? ? ? ? ? ? ? ? ? ? ? ?  ?  ? ? ? ? ? ? ? ?Anesthesia Physical ?Anesthesia Plan ? ?ASA: 3 ? ?Anesthesia Plan: MAC  ? ?Post-op Pain Management:   ? ?Induction: Intravenous ? ?PONV Risk Score and Plan: 3 and Propofol infusion ? ?Airway Management Planned: Simple Face Mask ? ?Additional Equipment:  ? ?Intra-op Plan:  ? ?Post-operative Plan:  ? ?Informed Consent: I have reviewed the patients History and Physical, chart, labs and discussed the procedure including the risks, benefits and alternatives for the proposed anesthesia with the patient or authorized representative who has indicated his/her understanding and acceptance.  ? ? ? ?Dental advisory given ? ?Plan Discussed with: Anesthesiologist and CRNA ? ?Anesthesia Plan Comments:   ? ? ? ? ? ?Anesthesia Quick Evaluation ? ?

## 2021-10-01 NOTE — Progress Notes (Addendum)
?Progress Note ? ? ?Patient: [name] HBZ:[id number] DOB: 02-20-1941 DOA: 09/29/2021     2 ?DOS: the patient was seen and examined on 10/01/2021 ?  ?Brief hospital course: ?[name] was admitted to the hospital with the working diagnosis of dysphagia, complicated with hypokalemia. ? ?81 yo female with the past medical history of esophageal cancer, atrial fibrillation, hypertension, heart failure and CVA who presented with abnormal labs. Patient with worsening dysphagia at home, complicated with poor oral intake and progressive weakness. As outpatient she was found in renal failure and hypokalemia and she was referred to the ED. On her initial physical examination her blood pressure was 106/45, HR 72, RR 14, 02 saturation 100%, lungs with no wheezing or rales, heart with S1 and S2 present and rhythmic, abdomen with mild tenderness, no rebound, no lower extremity edema.  ? ?Na 135, K 2,3 CL 92, serum bicarbonate 31, glucose 116, bun 26 cr 2,0 ?Wbc 6,0 hgb 12 hct 34 plt 139  ?Urine analysis SG 1,011, 6-10 wbc, negative protein  ? ?EKG 67 bpm, right axis, normal intervals, sinus rhythm, q wave V1 to V2 with no significant ST segment or T wave changes.  ? ?CT chest, abdomen and pelvis, small pericardial effusion, 2 small pulmonary nodules on the right lower lobe. No acute changes in the abdomen or pelvis.  ? ?Electrolytes were corrected and GI was consulted for further evaluation.   ? ?04/01 upper endoscopy with malignant appearing esophageal stenosis distally extending into the GE and proximal cardia.  ? ?Assessment and Plan: ?* AKI (acute kidney injury) ([place]) ?Hypokalemia.  ?Renal function with serum cr at 1,1 with K at 3,2, serum bicarbonate at 30 and Na at 141.  ? ?Plan to continue K correction with KCL IV and follow up renal function and electrolytes in am.  ?Continue gentle fluid repletion with balanced electrolyte solutions.  ?  ? ?Dysphagia ?Further work up with endoscopy with signs of recurrent esophageal  cancer with obstructing lesion.  ? ?Plan to continue with dysphagia 1 diet. ?Change antiacid therapy to liquid. ?Patient does not want to follow cancer therapy, she would like to continue care under palliative care/ hospice.  ? ? ?Diastolic heart failure ([place]) ?Compensated heart failure, tolerating well IV fluids. ?Continue blood pressure monitoring.  ? ?Essential (primary) hypertension ?Blood pressure systolic 101 post procedure. ?Plan to continue close follow up.  ? ?PAF (paroxysmal atrial fibrillation) ([place]) ?Anticoagulation with apixaban, patient not on AV blocking agents.  ?Discontinue telemetry monitoring.  ? ?Mixed hyperlipidemia ?Holding statin therapy for now.  ? ?CAD (coronary artery disease) ?No chest pain.  ? ?History of CVA (cerebrovascular accident) ?Continue with apixaban for anticoagulation  ? ?Protein-calorie malnutrition, severe ([place]) ?Continue with nutritional supplements.  ? ?Depression ?On mirtazapine  ? ? ? ? ?  ? ?Subjective: Patient post procedure, with no nausea or vomiting, no chest pain or dyspnea.  ? ?Physical Exam: ?Vitals:  ? 10/01/21 0936 10/01/21 0940 10/01/21 0950 10/01/21 1000  ?BP: (!) 93/36 (!) 96/42  113/73  ?Pulse:  64 73 71  ?Resp: '18 19 15 17  '$ ?Temp: 98.3 ?F (36.8 ?C)   97.6 ?F (36.4 ?C)  ?TempSrc:      ?SpO2: 100% 100% 99% 98%  ?Weight:      ?Height:      ? ?Neurology awake and alert ?ENT with mild pallor ?Cardiovascular with S1 and S2 present and rhythmic ?Respiratory with no wheezing ?Abdomen soft, not distended  ?No lower extremity edema  ?Data Reviewed: ? ? ? ?  Family Communication: no family at the bedside  ? ?Disposition: ?Status is: Inpatient ?Remains inpatient appropriate because: possible dc home tomorrow with hospice  ? Planned Discharge Destination: I spoke over the phone with the patient's son about patient's  condition, plan of care, prognosis and all questions were addressed.  ? ? ? ? ? ?Author: ?Tawni Millers, MD ?10/01/2021 3:18 PM ? ?For on call review  [email or web address].  ?

## 2021-10-01 NOTE — H&P (View-Only) (Signed)
? ?Gastroenterology Inpatient Follow-up Note  ? ?PATIENT IDENTIFICATION  ?[name] is a 81 y.o. female with a pmh significant for CAD, atrial fibrillation (on Eliquis), aortic stenosis, CHF, previous esophageal adenocarcinoma status post ESD, large intrathoracic hiatal hernia, prior H. pylori infection.  Patient admitted with failure to thrive and dysphagia. ?Hospital Day: 3 ? ?SUBJECTIVE  ?I have evaluated the patient this morning.  She states that after discussion with her family and thinking about things overall, she is amenable to an endoscopic evaluation to define whether any cancer recurrence has occurred and to see if there is anything that can help explain her symptoms.  She is not having any abdominal pain currently.  She still has and experiences heartburn.  She is not having any chest pain.  Her last intake of any fluids was around 1 AM which was a swig of Pepsi-Cola.  Otherwise she has not taken anything by mouth.  Her potassium has improved compared to yesterday in the twos it is 3.2 today.  She is not sure that she can continue taking the potassium pills. ? ? ?OBJECTIVE  ?Scheduled Inpatient Medications:  ? (feeding supplement) PROSource Plus  30 mL Oral TID BM  ? apixaban  2.5 mg Oral BID  ? Ensure Clear  237 mL Oral TID BM  ? famotidine  10.4 mg Oral QHS  ? melatonin  5-10 mg Oral QHS  ? mirtazapine  15 mg Oral QHS  ? multivitamin  15 mL Oral Daily  ? pantoprazole (PROTONIX) IV  40 mg Intravenous Q12H  ? protein supplement  1 Scoop Oral TID WC  ? temazepam  15 mg Oral QHS  ? ?Continuous Inpatient Infusions:  ? dextrose 5% lactated ringers 75 mL/hr at 09/30/21 1800  ? ?PRN Inpatient Medications: morphine injection, traMADol ? ? ?Physical Examination  ?Temp:  [97.6 ?F (36.4 ?C)-98 ?F (36.7 ?C)] 97.6 ?F (36.4 ?C) (04/01 0700) ?Pulse Rate:  [67-82] 73 (04/01 0700) ?Resp:  [15-16] 15 (04/01 0700) ?BP: (134-153)/(54-71) 138/56 (04/01 0700) ?SpO2:  [96 %-99 %] 99 % (04/01 0700) Temp (24hrs),  Avg:97.9 ?F (36.6 ?C), Min:97.6 ?F (36.4 ?C), Max:98 ?F (36.7 ?C) ? Weight: 37 kg ?GEN: NAD, appears stated age, doesn't appear chronically ill ?PSYCH: Cooperative, without pressured speech ?EYE: Conjunctivae pink, sclerae anicteric ?ENT: MMM, without oral ulcers, no erythema or exudates noted ?NECK: Supple ?CV: RR without R/Gs  ?RESP: CTAB posteriorly, without wheezing ?GI: NABS, soft, NT/ND, without rebound or guarding, no HSM appreciated ?GU: DRE shows ?MSK/EXT: _ edema, no palmar erythema ?SKIN: No jaundice, no spider angiomata, no concerning rashes ?NEURO:  Alert & Oriented x 3, no focal deficits, no evidence of asterixis ? ? ?Review of Data  ? ?Laboratory Studies  ? ?Recent Labs  ?Lab 09/29/21 ?1207 09/29/21 ?1822 09/30/21 ?8676 10/01/21 ?7209  ?NA 135 138 138 141  ?K 2.3* 2.6* 2.3* 3.2*  ?CL 92* 98 103 107  ?CO2 31 32 29 30  ?BUN 26* '23 17 12  '$ ?CREATININE 2.08* 1.68* 1.30* 1.11*  ?GLUCOSE 166* 144* 134* 161*  ?CALCIUM 8.8* 8.4* 8.0* 8.8*  ?MG 1.9  --   --  1.9  ?PHOS 4.0 3.6 3.4  --   ? ?Recent Labs  ?Lab 09/29/21 ?1207  ?AST 21  ?ALT 15  ?ALKPHOS 71  ?  ?Recent Labs  ?Lab 09/28/21 ?1650 09/29/21 ?1207 09/30/21 ?4709  ?WBC 6.2 6.0 4.7  ?HGB 13.2 12.0 10.5*  ?HCT 37.9 34.5* 30.3*  ?PLT 185.0 139* 122*  ? ?No  results for input(s): APTT, INR in the last 168 hours. ? ?Imaging Studies  ?No new imaging studies to review ? ?GI Procedures and Studies  ?No new imaging studies to review ? ? ?ASSESSMENT  ?Ms. Villers is a 81 y.o. female with a pmh significant for CAD, atrial fibrillation (on Eliquis), aortic stenosis, CHF, previous esophageal adenocarcinoma status post ESD, large intrathoracic hiatal hernia, prior H. pylori infection.  Patient admitted with failure to thrive and dysphagia. ? ?The patient is hemodynamically stable.  At this point, it is reasonable to consider an endoscopic evaluation to see if there is anything that may be mucosally causing her failure to thrive.  I think the majority of issues is her  large intrathoracic hiatal hernia.  Is not clear that there will be any role for endoscopic dilation though we will certainly consider that as a possible option.  Hopefully there has not been any cancer recurrence and we will hopefully be able to tell that based on our endoscopy.  I think that many signs are pointing towards the potential role of feeding tube placement for improvement of failure to thrive.  With as large of an intrathoracic portion of her stomach that she has, she may need a surgical feeding tube as a not sure if a percutaneous interventional radiology PEG tube or jejunostomy tube would be possible.  The risks and benefits of endoscopic evaluation were discussed with the patient; these include but are not limited to the risk of perforation, infection, bleeding, missed lesions, lack of diagnosis, severe illness requiring hospitalization, as well as anesthesia and sedation related illnesses.  The patient and/or family is agreeable to proceed.  Hopefully by performing this endoscopy this will help the patient and family with upcoming palliative care/palliative medicine discussion decide on overall goals of care.  All patient questions were answered to the best of my ability, and the patient agrees to the aforementioned plan of action with follow-up as indicated. ? ? ?PLAN/RECOMMENDATIONS  ?Remain NPO ?Plan for EGD later this morning if Anesthesia available otherwise Sunday - will update patient and team this AM ?[facility] ? ? ?Please page/call with questions or concerns. ? ? ?Justice Britain, MD ?[facility] ?Advanced Endoscopy ?Office # [phone] ? ? ? LOS: 2 days  ?[facility]  10/01/2021, 8:19 AM ? ?

## 2021-10-01 NOTE — Transfer of Care (Signed)
Immediate Anesthesia Transfer of Care Note ? ?Patient: [name] ? ?Procedure(s) Performed: ESOPHAGOGASTRODUODENOSCOPY (EGD) WITH PROPOFOL ?BIOPSY ? ?Patient Location: PACU ? ?Anesthesia Type:MAC ? ?Level of Consciousness: drowsy ? ?Airway & Oxygen Therapy: Patient Spontanous Breathing and Patient connected to face mask oxygen ? ?Post-op Assessment: Report given to RN, Post -op Vital signs reviewed and stable and Patient moving all extremities X 4 ? ?Post vital signs: Reviewed ? ?Last Vitals:  ?Vitals Value Taken Time  ?BP 93/36   ?Temp    ?Pulse 71   ?Resp    ?SpO2 100   ? ? ?Last Pain:  ?Vitals:  ? 10/01/21 0852  ?TempSrc: Temporal  ?PainSc: 5   ?   ? ?  ? ?Complications: No notable events documented. ?

## 2021-10-01 NOTE — TOC Progression Note (Addendum)
Transition of Care (TOC) - Progression Note  ? ? ?Patient Details  ?Name: ANISAH KUCK ?MRN: [id number] ?Date of Birth: 28-Mar-1941 ? ?Transition of Care (TOC) CM/SW Contact  ?Tawanna Cooler, RN ?Phone Number: ?10/01/2021, 3:10 PM ? ?Clinical Narrative:    ? ?Received a message from attending that the plan is for patient to go home on hospice tomorrow.  Called patient's son, Ivy Lynn, who states their choice is [facility] and that they've already contacted the hospice.  CM let him know that CM would also call them, to make sure the hospice had all the information that they needed.   ?CM called 24/7 number of [facility] with the answering service, who states they will pass information along to the on-call RN who can then call CM back.   ? ?1550 Addendum: Spoke with Horris Latino at [facility].  She states they won't be able to see the patient and admit her to hospice until Monday.  If patient/family/attending okay with discharge tomorrow and hospice seeing them on Monday, Horris Latino is agreeable.   ?TOC CM to let attending know. Horris Latino to call son and let him know.  ? ?

## 2021-10-01 NOTE — Anesthesia Postprocedure Evaluation (Signed)
Anesthesia Post Note ? ?Patient: [name] ? ?Procedure(s) Performed: ESOPHAGOGASTRODUODENOSCOPY (EGD) WITH PROPOFOL ?BIOPSY ? ?  ? ?Patient location during evaluation: Endoscopy ?Anesthesia Type: MAC ?Level of consciousness: awake ?Pain management: pain level controlled ?Vital Signs Assessment: post-procedure vital signs reviewed and stable ?Cardiovascular status: stable ?Postop Assessment: no apparent nausea or vomiting ?Anesthetic complications: no ? ? ?No notable events documented. ? ?Last Vitals:  ?Vitals:  ? 10/01/21 0950 10/01/21 1000  ?BP:  113/73  ?Pulse: 73 71  ?Resp: 15 17  ?Temp:  36.4 ?C  ?SpO2: 99% 98%  ?  ?Last Pain:  ?Vitals:  ? 10/01/21 1000  ?TempSrc:   ?PainSc: 0-No pain  ? ? ?  ?  ?  ?  ?  ?  ? ?Jori Frerichs ? ? ? ? ?

## 2021-10-02 DIAGNOSIS — I5032 Chronic diastolic (congestive) heart failure: Secondary | ICD-10-CM | POA: Diagnosis not present

## 2021-10-02 DIAGNOSIS — I2510 Atherosclerotic heart disease of native coronary artery without angina pectoris: Secondary | ICD-10-CM | POA: Diagnosis not present

## 2021-10-02 DIAGNOSIS — N179 Acute kidney failure, unspecified: Secondary | ICD-10-CM | POA: Diagnosis not present

## 2021-10-02 DIAGNOSIS — E782 Mixed hyperlipidemia: Secondary | ICD-10-CM | POA: Diagnosis not present

## 2021-10-02 LAB — BASIC METABOLIC PANEL
Anion gap: 3 — ABNORMAL LOW (ref 5–15)
BUN: 6 mg/dL — ABNORMAL LOW (ref 8–23)
CO2: 27 mmol/L (ref 22–32)
Calcium: 8.5 mg/dL — ABNORMAL LOW (ref 8.9–10.3)
Chloride: 110 mmol/L (ref 98–111)
Creatinine, Ser: 0.83 mg/dL (ref 0.44–1.00)
GFR, Estimated: >60 mL/min (ref >60)
Glucose, Bld: 156 mg/dL — ABNORMAL HIGH (ref 70–99)
Potassium: 4.2 mmol/L (ref 3.5–5.1)
Sodium: 140 mmol/L (ref 135–145)

## 2021-10-02 LAB — MAGNESIUM: Magnesium: 1.7 mg/dL (ref 1.7–2.4)

## 2021-10-02 LAB — PHOSPHORUS: Phosphorus: 2.1 mg/dL — ABNORMAL LOW (ref 2.5–4.6)

## 2021-10-02 MED ORDER — POTASSIUM CHLORIDE 10 MEQ/100ML IV SOLN
10.0000 meq | INTRAVENOUS | Status: AC
  Administered 2021-10-02 (×2): 10 meq via INTRAVENOUS
  Filled 2021-10-02: qty 100

## 2021-10-02 MED ORDER — APIXABAN 2.5 MG PO TABS: 2.5000 mg | ORAL_TABLET | Freq: Two times a day (BID) | ORAL | Status: AC

## 2021-10-02 MED ORDER — OMEPRAZOLE 2 MG/ML ORAL SUSPENSION: 20.0000 mg | Freq: Every day | ORAL | 0 refills | Status: AC

## 2021-10-02 MED ORDER — POTASSIUM & SODIUM PHOSPHATES 280-160-250 MG PO PACK
1.0000 | PACK | Freq: Three times a day (TID) | ORAL | Status: DC
  Filled 2021-10-02: qty 1

## 2021-10-02 MED ORDER — OMEPRAZOLE 2 MG/ML ORAL SUSPENSION: 20.0000 mg | Freq: Every day | ORAL | Status: DC

## 2021-10-02 MED ORDER — RESOURCE INSTANT PROTEIN PO PWD PACKET: 1.0000 | Freq: Three times a day (TID) | ORAL | 0 refills | Status: AC

## 2021-10-02 MED ORDER — FAMOTIDINE 40 MG/5ML PO SUSR: 10.0000 mg | Freq: Every day | ORAL | 0 refills | Status: AC

## 2021-10-02 MED ORDER — PANTOPRAZOLE SODIUM 40 MG IV SOLR
40.0000 mg | INTRAVENOUS | Status: DC
  Administered 2021-10-02: 40 mg via INTRAVENOUS
  Filled 2021-10-02: qty 10

## 2021-10-02 NOTE — Progress Notes (Signed)
?Progress Note ? ? ?Patient: [name] DGU:[id number] DOB: 08/20/1940 DOA: 09/29/2021     3 ?DOS: the patient was seen and examined on 10/02/2021 ?  ?Brief hospital course: ?[name] was admitted to the hospital with the working diagnosis of dysphagia, due to suspected recurrent esophageal cancer, complicated with hypokalemia. ? ?81 yo female with the past medical history of esophageal cancer, atrial fibrillation, hypertension, heart failure and CVA who presented with abnormal labs. Patient with worsening dysphagia at home, complicated with poor oral intake and progressive weakness. As outpatient she was found in renal failure and hypokalemia and she was referred to the ED. On her initial physical examination her blood pressure was 106/45, HR 72, RR 14, 02 saturation 100%, lungs with no wheezing or rales, heart with S1 and S2 present and rhythmic, abdomen with mild tenderness, no rebound, no lower extremity edema.  ? ?Na 135, K 2,3 CL 92, serum bicarbonate 31, glucose 116, bun 26 cr 2,0 ?Wbc 6,0 hgb 12 hct 34 plt 139  ?Urine analysis SG 1,011, 6-10 wbc, negative protein  ? ?EKG 67 bpm, right axis, normal intervals, sinus rhythm, q wave V1 to V2 with no significant ST segment or T wave changes.  ? ?CT chest, abdomen and pelvis, small pericardial effusion, 2 small pulmonary nodules on the right lower lobe. No acute changes in the abdomen or pelvis.  ? ?Electrolytes were corrected and GI was consulted for further evaluation.   ? ?04/01 upper endoscopy with malignant appearing esophageal stenosis distally extending into the GE and proximal cardia.  ? ?Patient has decided to go home with hospice/ palliative services.  ? ?Assessment and Plan: ?* AKI (acute kidney injury) ([place]) ?Hypokalemia.  ?Patient was admitted to the medical ward, she received IV fluids and her electrolytes were corrected.  ? ?At the time of her discharge she is tolerating well full liquids.  ?Her renal function has a serum cr of 0,83, K is 4,2  and serum bicarbonate at 27, with Mg at 1,7 and P 2.1.  ?Plan to continue close follow up as outpatient with hospice services.  ?  ? ?Esophageal cancer ([name]) ?Further work up with endoscopy with signs of recurrent esophageal cancer with obstructing lesion.  ? ?Patient has been placed on full liquids with good toleration. ?Continue with antiacid therapy. ?She was offered endoscopic stent as palliative measure for dysphagia. ?Pathology is still pending, but pre test probability for recurrent malignancy is very high.  ?She does note want to proceed with any type of cancer specific therapy.   ? ?Will have a family meeting today, if continue to decline further interventions, will plan to dc home with hospice services.  ? ? ?Diastolic heart failure ([name]) ?Compensated heart failure. No clinical signs of exacerbation.  ?Continue blood pressure monitoring.  ? ?Essential (primary) hypertension ?Blood pressure has remained stable, continue to hold on antihypertensive medications and close follow up as outpatient.  ? ?PAF (paroxysmal atrial fibrillation) ([name]) ?Resumed anticoagulation with apixaban, patient not on AV blocking agents.  ?Follow up cell count as outpatient.  ? ?Mixed hyperlipidemia ?Holding statin therapy for now.  ? ?CAD (coronary artery disease) ?No chest pain.  ? ?History of CVA (cerebrovascular accident) ?Continue with apixaban for anticoagulation  ? ?Protein-calorie malnutrition, severe ([name]) ?Continue with nutritional supplements.  ? ?Depression ?On mirtazapine  ? ? ? ? ?  ? ?Subjective: Patient very weak and deconditioned, she wants to go home today and continue to decline any invasive interventions, her nice is at  the bedside  ? ?Physical Exam: ?Vitals:  ? 10/01/21 1000 10/01/21 1526 10/01/21 1956 10/02/21 0559  ?BP: 113/73 (!) 149/82 (!) 148/58 (!) 106/50  ?Pulse: 71 83 78 62  ?Resp: '17 16 18 20  '$ ?Temp: 97.6 ?F (36.4 ?C) 97.8 ?F (36.6 ?C) 98.2 ?F (36.8 ?C) 98.3 ?F (36.8 ?C)  ?TempSrc:  Oral Oral Oral   ?SpO2: 98% 96% 95% 99%  ?Weight:      ?Height:      ? ?Neurology awake and alert ?ENT with mild pallor with no icterus ?Cardiovascular with S1 and S2 present and rhythmic with no gallops or murmurs ?Respiratory with no wheezing or rales ?Abdomen not distended or tender ?No lower extremity edema  ?Data Reviewed: ? ? ? ?Family Communication: I spoke with patient's nice at the bedside, we talked in detail about patient's condition, plan of care and prognosis and all questions were addressed. ? ? ?Disposition: ?Status is: Inpatient ?Remains inpatient appropriate because: pending family meeting, possible dc home today with hospice  ? Planned Discharge Destination: Home ? ?Author: ?Tawni Millers, MD ?10/02/2021 11:50 AM ? ?For on call review [email or web address]  ?

## 2021-10-02 NOTE — Discharge Summary (Addendum)
Physician Discharge Summary   Patient: [name] MRN: [id number] DOB: 04-08-1941  Admit date:     09/29/2021  Discharge date: 10/02/21  Discharge Physician: York Ram Makaylia Hewett   PCP: Shirline Frees, NP   Recommendations at discharge:    Patient has decided to go home with hospice services Continue with antiacid therapy with omeprazole and famotidine.  At this point in time she does not want invasive procedures including stent placement at the distal esophagus. Final biopsy result is still pending.  Continue with full liquids as tolerated.  Decrease apixaban to 2,5 mg bid to decrease risk of bleeding.  Hold on furosemide and K supplementation.   Discharge Diagnoses: Principal Problem:   AKI (acute kidney injury) ([name]) Active Problems:   Esophageal cancer ([name])   Diastolic heart failure ([name])   Essential (primary) hypertension   PAF (paroxysmal atrial fibrillation) ([name])   Mixed hyperlipidemia   CAD (coronary artery disease)   History of CVA (cerebrovascular accident)   Protein-calorie malnutrition, severe ([name])   Depression  Resolved Problems:   * No resolved hospital problems. [facility] Course: [name] was admitted to the hospital with the working diagnosis of dysphagia, due to suspected recurrent esophageal cancer, complicated with hypokalemia.  81 yo female with the past medical history of esophageal cancer, atrial fibrillation, hypertension, heart failure and CVA who presented with abnormal labs. Patient with worsening dysphagia at home, complicated with poor oral intake and progressive weakness. As outpatient she was found in renal failure and hypokalemia and she was referred to the ED. On her initial physical examination her blood pressure was 106/45, HR 72, RR 14, 02 saturation 100%, lungs with no wheezing or rales, heart with S1 and S2 present and rhythmic, abdomen with mild tenderness, no rebound, no lower extremity edema.   Na 135, K 2,3 CL 92, serum  bicarbonate 31, glucose 116, bun 26 cr 2,0 Wbc 6,0 hgb 12 hct 34 plt 139  Urine analysis SG 1,011, 6-10 wbc, negative protein   EKG 67 bpm, right axis, normal intervals, sinus rhythm, q wave V1 to V2 with no significant ST segment or T wave changes.   CT chest, abdomen and pelvis, small pericardial effusion, 2 small pulmonary nodules on the right lower lobe. No acute changes in the abdomen or pelvis.   Electrolytes were corrected and GI was consulted for further evaluation.    04/01 upper endoscopy with malignant appearing esophageal stenosis distally extending into the GE and proximal cardia.   Patient has decided to go home with hospice/ palliative services.   Assessment and Plan: * AKI (acute kidney injury) ([name]) Hypokalemia.  Patient was admitted to the medical ward, she received IV fluids and her electrolytes were corrected.   At the time of her discharge she is tolerating well full liquids.  Her renal function has a serum cr of 0,83, K is 4,2 and serum bicarbonate at 27, with Mg at 1,7 and P 2.1.  Plan to continue close follow up as outpatient with hospice services.     Esophageal cancer ([name]) Further work up with endoscopy with signs of recurrent esophageal cancer with obstructing lesion.   Patient has been placed on full liquids with good toleration. Continue with antiacid therapy. She was offered endoscopic stent as palliative measure for dysphagia. Pathology is still pending, but pre test probability for recurrent malignancy is very high.  She does not want to proceed with any type of cancer specific therapy.    Will have a  family meeting today, if continue to decline further interventions, will plan to dc home with hospice services.    Diastolic heart failure ([name]) Compensated heart failure. No clinical signs of exacerbation.  Continue blood pressure monitoring.   Essential (primary) hypertension Blood pressure has remained stable, continue to hold on  antihypertensive medications and close follow up as outpatient.   PAF (paroxysmal atrial fibrillation) ([name]) Resumed anticoagulation with apixaban, patient not on AV blocking agents.  Follow up cell count as outpatient.   Mixed hyperlipidemia Holding statin therapy for now.   CAD (coronary artery disease) No chest pain.   History of CVA (cerebrovascular accident) Continue with apixaban for anticoagulation   Protein-calorie malnutrition, severe ([name]) Continue with nutritional supplements.   Depression On mirtazapine          Consultants: GI  Procedures performed: EGD  Disposition: Home Diet recommendation:  Discharge Diet Orders (From admission, onward)     Start     Ordered   10/02/21 0000  Diet - low sodium heart healthy        10/02/21 1427           Diet full liquids.  DISCHARGE MEDICATION: Allergies as of 10/02/2021       Reactions   Iodinated Contrast Media Anaphylaxis, Other (See Comments)   "IPD dye"- Info given by patient   Iodine Anaphylaxis   Ioxaglate Anaphylaxis, Other (See Comments)   Lactose Intolerance (gi) Anaphylaxis, Other (See Comments)   Can tolerate "cooked milk," per the patient (2023)   Red Dye Anaphylaxis   Hydralazine Anxiety, Other (See Comments)   Facial flushing, pt prefers not to use it.    Propoxyphene Rash, Anxiety        Medication List     STOP taking these medications    furosemide 40 MG tablet Commonly known as: LASIX   meclizine 25 MG tablet Commonly known as: ANTIVERT   mirtazapine 15 MG disintegrating tablet Commonly known as: REMERON SOL-TAB   pantoprazole 40 MG tablet Commonly known as: PROTONIX   potassium chloride 10 MEQ tablet Commonly known as: KLOR-CON   predniSONE 50 MG tablet Commonly known as: DELTASONE       TAKE these medications    amLODipine 5 MG tablet Commonly known as: NORVASC TAKE (1) TABLET BY MOUTH ONCE DAILY. What changed: See the new instructions.   apixaban 2.5 MG  Tabs tablet Commonly known as: ELIQUIS Take 1 tablet (2.5 mg total) by mouth 2 (two) times daily. What changed:  medication strength See the new instructions.   atorvastatin 80 MG tablet Commonly known as: LIPITOR Take 1 tablet (80 mg total) by mouth daily. What changed:  when to take this additional instructions   buPROPion 150 MG 24 hr tablet Commonly known as: WELLBUTRIN XL TAKE (1) TABLET BY MOUTH ONCE DAILY. What changed: See the new instructions.   carvedilol 25 MG tablet Commonly known as: COREG Take 1 tablet (25 mg total) by mouth 2 (two) times daily with a meal.   cholecalciferol 25 MCG (1000 UNIT) tablet Commonly known as: VITAMIN D3 Take 1,000 Units by mouth daily.   Ensure Clear Liqd Take 237 mLs by mouth See admin instructions. Drink 237 ml's by mouth three to four times a day for meal supplementation   famotidine 40 MG/5ML suspension Commonly known as: PEPCID Take 1.3 mLs (10.4 mg total) by mouth at bedtime.   FLINTSTONES MULTIVITAMIN PO Take 1 tablet by mouth every evening.   Melatonin 5 MG Caps Take 5-10  mg by mouth at bedtime.   omeprazole 2 mg/mL Susp oral suspension Commonly known as: FIRST-Omeprazole Take 10 mLs (20 mg total) by mouth daily for 3 days. Start taking on: October 03, 2021   ondansetron 4 MG tablet Commonly known as: Zofran Take 1 tablet (4 mg total) by mouth every 8 (eight) hours as needed for nausea or vomiting.   protein supplement 6 g Powd Commonly known as: RESOURCE BENEPROTEIN Take 1 Scoop (6 g total) by mouth 3 (three) times daily with meals.   temazepam 15 MG capsule Commonly known as: RESTORIL TAKE (1) CAPSULE BY MOUTH AT BEDTIME. What changed: See the new instructions.   traMADol 50 MG tablet Commonly known as: ULTRAM Take 1 tablet (50 mg total) by mouth every 8 (eight) hours. TAKE (1) TABLET BY MOUTH EVERY 8 HOURS. What changed:  when to take this reasons to take this additional instructions   vitamin C 500 MG  tablet Commonly known as: ASCORBIC ACID Take 500 mg by mouth daily.        Discharge Exam: Filed Weights   09/29/21 1740  Weight: 37 kg   Patient with no nausea or vomiting, no dyspnea or chest pain  BP (!) 149/70 (BP Location: Left Arm)   Pulse 78   Temp 97.9 F (36.6 C) (Oral)   Resp 17   Ht 5\' 2"  (1.575 m)   Wt 37 kg   SpO2 94%   BMI 14.92 kg/m   Neurology awake and alert ENT with mild pallor Cardiovascular with S1 and S2 present and rhythmic with no gallops or murmurs Respiratory with clear lungs to auscultation  Abdomen soft and not distended No lower extremity edema   Condition at discharge: stable  The results of significant diagnostics from this hospitalization (including imaging, microbiology, ancillary and laboratory) are listed below for reference.   Imaging Studies: DG Chest 2 View  Result Date: 09/08/2021 CLINICAL DATA:  Dysphagia, possible aspiration EXAM: CHEST - 2 VIEW COMPARISON:  04/24/2021 FINDINGS: Cardiac shadow is within normal limits. Aortic calcifications are noted. Lungs are well aerated without focal infiltrate. Degenerative changes of the thoracic spine are seen. IMPRESSION: No active cardiopulmonary disease. Electronically Signed   By: Alcide Clever M.D.   On: 09/08/2021 01:35   DG SWALLOW FUNC OP MEDICARE SPEECH PATH  Result Date: 09/21/2021 Table formatting from the original result was not included. Objective Swallowing Evaluation: Type of Study: MBS-Modified Barium Swallow Study  Patient Details Name: [name] MRN: [id number] Date of Birth: Mar 15, 1941 Today's Date: 09/21/2021 Time: SLP Start Time (ACUTE ONLY): 1147 -SLP Stop Time (ACUTE ONLY): 1240 SLP Time Calculation (min) (ACUTE ONLY): 53 min Past Medical History: Past Medical History: Diagnosis Date  Anemia 2005  Generally microcytic, transfusions in [address], 2012, 02/2015, 05/2015  Aortic stenosis   Moderate November 2017  Arthritis   Broken back 2013  Chronic back pain.   CAD (coronary  artery disease)   Cardiac catheterization 2014 - 80% mid RCA and 70% OM managed medically  CHF (congestive heart failure) ([name])   Chronic bilateral pleural effusions   Chronic headache   Contrast media allergy   Diastolic heart failure ([name])   Diverticulosis   Esophageal cancer ([name]) 05/06/2015  Adenocarcinoma GE junction  Facial paresthesia   GERD (gastroesophageal reflux disease)   H. pylori infection   H/O iron deficiency   05-06-15 iron infusion ([facility])  HH (hiatus hernia) 2008  Large with associated erosions  Hypertension   Paroxysmal atrial fibrillation ([name])  PONV (postoperative nausea and vomiting)   Pulmonary emboli ([name]) 2008, 2012  Stroke [facility]) 1982  Swallowing dysfunction  Past Surgical History: Past Surgical History: Procedure Laterality Date  APPENDECTOMY  1950s  CARDIOVERSION N/A 07/08/2019  Procedure: CARDIOVERSION;  Surgeon: Chrystie Nose, MD;  Location: [facility] ENDOSCOPY;  Service: Cardiovascular;  Laterality: N/A;  CARPAL TUNNEL RELEASE Bilateral 1990s  CERVICAL DISC SURGERY  2014  CHOLECYSTECTOMY  1980s  open  COLONOSCOPY N/A 02/17/2015  Procedure: COLONOSCOPY;  Surgeon: Louis Meckel, MD;  Location: [facility] ENDOSCOPY;  Service: Endoscopy;  Laterality: N/A;  ESOPHAGOGASTRODUODENOSCOPY N/A 02/16/2015  Procedure: ESOPHAGOGASTRODUODENOSCOPY (EGD);  Surgeon: Louis Meckel, MD;  Location: [facility] ENDOSCOPY;  Service: Endoscopy;  Laterality: N/A;  ESOPHAGOGASTRODUODENOSCOPY N/A 05/06/2015  Procedure: ESOPHAGOGASTRODUODENOSCOPY (EGD);  Surgeon: Beverley Fiedler, MD;  Location: [facility] ENDOSCOPY;  Service: Endoscopy;  Laterality: N/A;  EUS N/A 05/20/2015  Procedure: UPPER ENDOSCOPIC ULTRASOUND (EUS) LINEAR;  Surgeon: Rachael Fee, MD;  Location: [facility] ENDOSCOPY;  Service: Endoscopy;  Laterality: N/A;  exploratory lab  1950s or 1960s  GIVENS CAPSULE STUDY N/A 05/06/2015  Procedure: GIVENS CAPSULE STUDY;  Surgeon: Beverley Fiedler, MD;  Location: [facility] ENDOSCOPY;  Service: Endoscopy;  Laterality: N/A;  KNEE ARTHROSCOPY WITH MEDIAL  MENISECTOMY Left 04/18/2018  Procedure: LEFT KNEE ARTHROSCOPY WITH PARTIAL MEDIAL MENISECTOMY AND ANTERIOR CRUCIATE LIGAMENT DEBRIDEMENT;  Surgeon: Vickki Hearing, MD;  Location: [facility] ORS;  Service: Orthopedics;  Laterality: Left;  LEFT HEART CATH AND CORONARY ANGIOGRAPHY N/A 11/08/2016  Procedure: Left Heart Cath and Coronary Angiography;  Surgeon: Marykay Lex, MD;  Location: [facility] INVASIVE CV LAB;  Service: Cardiovascular;  Laterality: N/A;  LEFT HEART CATH AND CORONARY ANGIOGRAPHY N/A 08/19/2019  Procedure: LEFT HEART CATH AND CORONARY ANGIOGRAPHY;  Surgeon: [place], Peter M, MD;  Location: [facility] INVASIVE CV LAB;  Service: Cardiovascular;  Laterality: N/A;  lumbar back surgery  2012  LUMBAR DISC SURGERY  1991  TONSILLECTOMY AND ADENOIDECTOMY  1960s HPI: Pt is an 81 y/o female who presented for an outpatient modified barium swallow study. Pt reported c/o the following: food and pills "sticking" in throat, coughing during and after meals, difficulty with mastication, poor appetite, regurgitation, burning with swallowing, and eructation. She stated that she has been symptomatic of aspiration since her last MBS in 2020, but that her symptoms have progressively worsened. Pt further stated that for the past six months she has had he sensation of a tight belt around her stomach, and the sensation of something "closing down" when she swallows. Pt was referred by her PCP who advised her on 3/8 to consume a puree diet and use a chin tuck posture. CXR on 3/8 was negative for acute changes. PMH: hiatal hernia, stroke, diverticulosis, GERD, esophageal cancer 2016 (treated surgically), post C3-4, C5-6 and C6-7 ACDF. MBS 04/07/19: mild/mod oropharyngeal dysphagia characterized by mild lingual weakness with reduced lingual manipulation of bolus and premature spillage into pharynx, incomplete epiglottic deflection with liquids resulting in retrograde movement of bolus in valleculae into nasopharynx before pooling in valleculae post  swallow.  No data recorded  Recommendations for follow up therapy are one component of a multi-disciplinary discharge planning process, led by the attending physician.  Recommendations may be updated based on patient status, additional functional criteria and insurance authorization. Assessment / Plan / Recommendation   09/21/2021   3:00 PM Clinical Impressions Clinical Impression Pt presents with oropharyngeal dysphagia characterized by impaired posterior bolus propulsion, reduced tongue base retraction, reduced anterior laryngeal movement, reduced pharyngeal constriction, and velopharyngeal dysfunction. Lingual  pumping was demonstrated and 4-5 attempts were often necessary for A-P transport. She demonstrated posterior pharyngeal wall residue, vallecular residue, pyriform sinus residue, intermittently incomplete epiglottic inversion, and inconsistently inadequate velopharyngeal closure. Pharyngeal residue was improved, but not eliminated, with secondary swallows. She demonstrated aspiration (PAS 7) of thin liquids via straw when consecutive swallows were used and penetration (PAS 5) was noted with individual swallows via straw. A chin tuck posture was noted to facilitate penetration and subsequent aspiration of liquids in the pyriform sinuses. Epiglottic inversion worsened as the study progressed and SLP questions the impact of fatigue. Aspiration consistently resulted in coughing which was effective in expelling penetrant, but not aspirate. Esophageal sweep revealed stasis of barium within the esophagus.  It is recommended that the pt's current diet of puree solids and thin liquids via cup be continued. Dysphagia treatment is clinically indicated at this time via outpatient or home health. SLP reviewed pt's MBS video from October, 2020 and completed side-by-side comparision of some consistencies. Overall, pt's oropharyngeal swallow function does not appear to be so significantly worse to warrant the severity of her  worsening symptoms and weight loss.Considering pt's c/o burning, frequent regurgitation, the sensation of abdominal tightness, and observance of esophageal stasis, SLP is in agreement with PCP's referral to GI. SLP Visit Diagnosis Dysphagia, oropharyngeal phase (R13.12) Impact on safety and function Mild aspiration risk;Moderate aspiration risk     09/21/2021   3:00 PM Treatment Recommendations Treatment Recommendations Therapy as outlined in treatment plan below     09/21/2021   3:00 PM Prognosis Prognosis for Safe Diet Advancement Fair Barriers to Reach Goals Severity of deficits;Time post onset   09/21/2021   3:00 PM Diet Recommendations SLP Diet Recommendations Dysphagia 1 (Puree) solids;Thin liquid Liquid Administration via Cup;No straw Medication Administration Whole meds with puree Compensations Slow rate;Small sips/bites;Multiple dry swallows after each bite/sip;Follow solids with liquid Postural Changes Seated upright at 90 degrees     09/21/2021   3:00 PM Other Recommendations Recommended Consults Consider GI evaluation;Consider esophageal assessment Oral Care Recommendations Oral care BID Follow Up Recommendations Outpatient SLP   03/10/2019  10:24 AM Frequency and Duration  Speech Therapy Frequency (ACUTE ONLY) min 2x/week Treatment Duration 2 weeks     09/21/2021   3:00 PM Oral Phase Oral Phase Impaired Oral - Nectar Cup Reduced posterior propulsion;Lingual pumping;Decreased velopharyngeal closure Oral - Nectar Straw Reduced posterior propulsion;Lingual pumping Oral - Thin Cup Reduced posterior propulsion;Lingual pumping;Decreased velopharyngeal closure Oral - Thin Straw Reduced posterior propulsion;Lingual pumping;Decreased velopharyngeal closure Oral - Puree Reduced posterior propulsion;Lingual pumping Oral - Mech Soft Reduced posterior propulsion;Lingual pumping Oral - Pill Reduced posterior propulsion;Lingual pumping    09/21/2021   3:00 PM Pharyngeal Phase Pharyngeal Phase Impaired Pharyngeal- Nectar Cup  Pharyngeal residue - pyriform;Pharyngeal residue - valleculae;Pharyngeal residue - posterior pharnyx;Reduced epiglottic inversion;Reduced pharyngeal peristalsis;Reduced tongue base retraction;Reduced anterior laryngeal mobility Pharyngeal- Nectar Straw Pharyngeal residue - pyriform;Pharyngeal residue - valleculae;Pharyngeal residue - posterior pharnyx;Reduced epiglottic inversion;Reduced pharyngeal peristalsis;Reduced tongue base retraction;Reduced anterior laryngeal mobility Pharyngeal- Thin Cup Pharyngeal residue - pyriform;Pharyngeal residue - valleculae;Pharyngeal residue - posterior pharnyx;Reduced epiglottic inversion;Reduced pharyngeal peristalsis;Reduced tongue base retraction Pharyngeal- Thin Straw Pharyngeal residue - pyriform;Pharyngeal residue - valleculae;Pharyngeal residue - posterior pharnyx;Reduced epiglottic inversion;Reduced pharyngeal peristalsis;Reduced tongue base retraction;Reduced airway/laryngeal closure;Reduced anterior laryngeal mobility;Penetration/Aspiration during swallow;Penetration/Apiration after swallow Pharyngeal Material enters airway, CONTACTS cords and not ejected out;Material enters airway, passes BELOW cords and not ejected out despite cough attempt by patient Pharyngeal- Puree Pharyngeal residue - pyriform;Pharyngeal residue - valleculae;Pharyngeal residue -  posterior pharnyx;Reduced epiglottic inversion;Reduced pharyngeal peristalsis;Reduced tongue base retraction;Reduced anterior laryngeal mobility Pharyngeal- Mechanical Soft Pharyngeal residue - pyriform;Pharyngeal residue - valleculae;Pharyngeal residue - posterior pharnyx;Reduced epiglottic inversion;Reduced pharyngeal peristalsis;Reduced tongue base retraction;Reduced anterior laryngeal mobility Pharyngeal- Pill Pharyngeal residue - pyriform;Pharyngeal residue - valleculae;Pharyngeal residue - posterior pharnyx;Reduced epiglottic inversion;Reduced pharyngeal peristalsis;Reduced tongue base retraction;Reduced anterior  laryngeal mobility    03/10/2019  10:24 AM Cervical Esophageal Phase  Cervical Esophageal Phase Impaired Shanika I. Vear Clock, MS, CCC-SLP Acute Rehabilitation Services Office number [phone] Pager [phone] Scheryl Marten 09/21/2021, 4:18 PM                   CLINICAL DATA:  Dysphagia. Cough/GE reflux disease/other secondary diagnosis EXAM: MODIFIED BARIUM SWALLOW TECHNIQUE: Different consistencies of barium were administered orally to the patient by the Speech Pathologist. Imaging of the pharynx was performed in the lateral projection. FLUOROSCOPY TIME:  Fluoroscopy Time:  4 minutes 58 seconds. COMPARISON:  None. FINDINGS: Modified barium swallow was performed by the speech pathologist. Radiologist was not involved with this exam. Please refer to the Speech Pathology report for results and recommendations. IMPRESSION: Please refer to the Speech Pathologists report for complete details and recommendations. Electronically Signed   By: Paulina Fusi M.D.   On: 09/21/2021 12:31   CT CHEST ABDOMEN PELVIS WO CONTRAST  Result Date: 09/29/2021 CLINICAL DATA:  Weight loss, unintended EXAM: CT CHEST, ABDOMEN AND PELVIS WITHOUT CONTRAST TECHNIQUE: Multidetector CT imaging of the chest, abdomen and pelvis was performed following the standard protocol without IV contrast. RADIATION DOSE REDUCTION: This exam was performed according to the departmental dose-optimization program which includes automated exposure control, adjustment of the mA and/or kV according to patient size and/or use of iterative reconstruction technique. COMPARISON:  CT 02/16/2018, PET-CT 05/31/2015. FINDINGS: CT CHEST FINDINGS Cardiovascular: Normal cardiac size.Small pericardial effusion similar to prior exam.Coronary artery and mitral annular calcifications. Extensive atherosclerotic calcifications of the thoracic aorta.Normal size main and branch pulmonary arteries. Mediastinum/Nodes: No lymphadenopathy.Large hiatal hernia. The trachea is  unremarkable. Lungs/Pleura: Tiny 2 and 3 mm right lower lobe subpleural pulmonary nodules (series 4, image 67). These were previously obscured by a pleural effusion.No other suspicious nodules.No pleural effusion.No pneumothorax. Musculoskeletal: Prior C5-C7 ACDF. Trace anterolisthesis at C7-T1. Multilevel degenerative changes of the thoracic spine with bridging anterolateral osteophyte formation consistent with diffuse idiopathic skeletal hyperostosis. No evidence of fracture. No suspicious lytic or blastic lesions. CT ABDOMEN PELVIS FINDINGS Hepatobiliary: No focal liver abnormality is seen. Prior cholecystectomy with prominent common bile duct, similar to prior exam and likely related to post cholecystectomy state. Pancreas: The pancreas is mildly atrophic.  No ductal dilation. Spleen: The spleen is normal in size. There is an unchanged peripherally calcified cystic lesion in the anterior spleen, likely posttraumatic/post infectious. Adrenals/Urinary Tract: Adrenal glands are unremarkable. Mild renal cortical atrophy bilaterally. No hydronephrosis. Bilateral renal vascular calcifications. There is a punctate nonobstructive stone in the left mid kidney and right lower pole. The bladder is moderately distended but unremarkable. Stomach/Bowel: Large hiatal hernia containing the majority of the stomach. There is no evidence of bowel obstruction. Prior appendectomy. Vascular/Lymphatic: Aortoiliac atherosclerotic calcifications. No AAA. No lymphadenopathy. Reproductive: Unremarkable. Other: No abdominal wall hernia or abnormality. No abdominopelvic ascites. Musculoskeletal: Multilevel degenerative changes of the spine. Prior inter spinous fusion at L4-L5. Mild retrolisthesis at L2-L3 and L3-L4. Mild anterolisthesis at L4-L5. No suspicious lytic or blastic lesions. No evidence of acute fracture bilateral hip osteoarthritis. IMPRESSION: CHEST: Small pericardial effusion similar to prior exam in 2019. No focal  airspace  disease or other acute findings in the chest. Two small 2 and 3 mm pulmonary nodules in the posterior aspect of the right lower lobe. Although these are likely benign, given prior history of GE junction carcinoma, recommend follow-up chest CT in 3 months to assess for interval change. ABDOMEN/PELVIS: No acute findings in the abdomen or pelvis. Unchanged large hiatal hernia. Punctate nonobstructive nephrolithiasis bilaterally. Electronically Signed   By: Caprice Renshaw M.D.   On: 09/29/2021 12:02    Microbiology: Results for orders placed or performed during the hospital encounter of 04/24/21  Resp Panel by RT-PCR (Flu A&B, Covid) Nasopharyngeal Swab     Status: None   Collection Time: 04/24/21  4:01 PM   Specimen: Nasopharyngeal Swab; Nasopharyngeal(NP) swabs in vial transport medium  Result Value Ref Range Status   SARS Coronavirus 2 by RT PCR NEGATIVE NEGATIVE Final    Comment: (NOTE) SARS-CoV-2 target nucleic acids are NOT DETECTED.  The SARS-CoV-2 RNA is generally detectable in upper respiratory specimens during the acute phase of infection. The lowest concentration of SARS-CoV-2 viral copies this assay can detect is 138 copies/mL. A negative result does not preclude SARS-Cov-2 infection and should not be used as the sole basis for treatment or other patient management decisions. A negative result may occur with  improper specimen collection/handling, submission of specimen other than nasopharyngeal swab, presence of viral mutation(s) within the areas targeted by this assay, and inadequate number of viral copies(<138 copies/mL). A negative result must be combined with clinical observations, patient history, and epidemiological information. The expected result is Negative.  Fact Sheet for Patients:  BloggerCourse.com  Fact Sheet for Healthcare Providers:  SeriousBroker.it  This test is no t yet approved or cleared by the [place]  FDA and  has been authorized for detection and/or diagnosis of SARS-CoV-2 by FDA under an Emergency Use Authorization (EUA). This EUA will remain  in effect (meaning this test can be used) for the duration of the COVID-19 declaration under Section 564(b)(1) of the Act, 21 U.S.C.section 360bbb-3(b)(1), unless the authorization is terminated  or revoked sooner.       Influenza A by PCR NEGATIVE NEGATIVE Final   Influenza B by PCR NEGATIVE NEGATIVE Final    Comment: (NOTE) The Xpert Xpress SARS-CoV-2/FLU/RSV plus assay is intended as an aid in the diagnosis of influenza from Nasopharyngeal swab specimens and should not be used as a sole basis for treatment. Nasal washings and aspirates are unacceptable for Xpert Xpress SARS-CoV-2/FLU/RSV testing.  Fact Sheet for Patients: BloggerCourse.com  Fact Sheet for Healthcare Providers: SeriousBroker.it  This test is not yet approved or cleared by the [place] FDA and has been authorized for detection and/or diagnosis of SARS-CoV-2 by FDA under an Emergency Use Authorization (EUA). This EUA will remain in effect (meaning this test can be used) for the duration of the COVID-19 declaration under Section 564(b)(1) of the Act, 21 U.S.C. section 360bbb-3(b)(1), unless the authorization is terminated or revoked.  Performed at [facility], [address], [place], [place] [address]     Labs: CBC: Recent Labs  Lab 09/28/21 1650 09/29/21 1207 09/30/21 0549  WBC 6.2 6.0 4.7  NEUTROABS 4.0 4.1  --   HGB 13.2 12.0 10.5*  HCT 37.9 34.5* 30.3*  MCV 83.5 83.7 85.4  PLT 185.0 139* 122*   Basic Metabolic Panel: Recent Labs  Lab 09/29/21 1207 09/29/21 1822 09/30/21 0549 10/01/21 0531 10/02/21 0612  NA 135 138 138 141 140  K 2.3* 2.6* 2.3*  3.2* 4.2  CL 92* 98 103 107 110  CO2 31 32 29 30 27   GLUCOSE 166* 144* 134* 161* 156*  BUN 26* 23 17 12  6*  CREATININE 2.08* 1.68* 1.30* 1.11*  0.83  CALCIUM 8.8* 8.4* 8.0* 8.8* 8.5*  MG 1.9  --   --  1.9 1.7  PHOS 4.0 3.6 3.4  --  2.1*   Liver Function Tests: Recent Labs  Lab 09/28/21 1650 09/29/21 1207 09/29/21 1822 09/30/21 0549  AST 15 21  --   --   ALT 12 15  --   --   ALKPHOS 84 71  --   --   BILITOT 0.9 0.9  --   --   PROT 7.0 6.7  --   --   ALBUMIN 4.2 3.8 3.5 3.1*   CBG: No results for input(s): GLUCAP in the last 168 hours.  Discharge time spent: greater than 30 minutes.  Signed: Coralie Keens, MD [facility] 10/02/2021

## 2021-10-02 NOTE — Progress Notes (Addendum)
? ?Gastroenterology Inpatient Follow-up Note  ? ?PATIENT IDENTIFICATION  ?[name] is a 81 y.o. female with a pmh significant for CAD, atrial fibrillation (on Eliquis), aortic stenosis, CHF, previous esophageal adenocarcinoma status post ESD, large intrathoracic hiatal hernia, prior H. pylori infection.  Patient admitted with failure to thrive and dysphagia. ?Hospital Day: 4 ? ?SUBJECTIVE  ?Today, the patient is seen in follow-up.  Her niece is with her.  We rereviewed the results of yesterday's endoscopy.  We also discussed her overall goals.  It seems that she wants to go home with hospice at this time.  She feels pretty comfortable that she does not want chemotherapy or radiation therapy or surgery if this does return as cancer which is certainly our concern based on the biopsies. ? ? ?OBJECTIVE  ?Scheduled Inpatient Medications:  ? (feeding supplement) PROSource Plus  30 mL Oral TID BM  ? apixaban  2.5 mg Oral BID  ? Ensure Clear  237 mL Oral TID BM  ? famotidine  10.4 mg Oral QHS  ? melatonin  5-10 mg Oral QHS  ? mirtazapine  15 mg Oral QHS  ? multivitamin  15 mL Oral Daily  ? pantoprazole (PROTONIX) IV  40 mg Intravenous Q24H  ? protein supplement  1 Scoop Oral TID WC  ? temazepam  15 mg Oral QHS  ? ?Continuous Inpatient Infusions:  ? dextrose 5% lactated ringers 50 mL/hr at 10/02/21 0609  ? ?PRN Inpatient Medications: morphine injection, traMADol ? ? ?Physical Examination  ?Temp:  [97.8 ?F (36.6 ?C)-98.3 ?F (36.8 ?C)] 98.3 ?F (36.8 ?C) (04/02 0559) ?Pulse Rate:  [62-83] 62 (04/02 0559) ?Resp:  [16-20] 20 (04/02 0559) ?BP: (106-149)/(50-82) 106/50 (04/02 0559) ?SpO2:  [95 %-99 %] 99 % (04/02 0559) Temp (24hrs), Avg:98.1 ?F (36.7 ?C), Min:97.8 ?F (36.6 ?C), Max:98.3 ?F (36.8 ?C) ? Weight: 37 kg ?GEN: No acute distress, appears chronically ill but nontoxic ?PSYCH: Cooperative, without pressured speech ?EYE: Conjunctivae pink, sclerae anicteric ?ENT: Dry mucous membranes ?CV: Nontachycardic ?RESP: No  audible wheezing ?GI: NABS, soft, NT/ND, without rebound or guarding ?MSK/EXT: No lower extremity edema ?SKIN: No jaundice ?NEURO:  Alert & Oriented x 3, no focal deficits ? ? ?Review of Data  ? ?Laboratory Studies  ? ?Recent Labs  ?Lab 09/29/21 ?1822 09/30/21 ?2595 10/01/21 ?6387 10/02/21 ?0612  ?NA 138 138   < > 140  ?K 2.6* 2.3*   < > 4.2  ?CL 98 103   < > 110  ?CO2 32 29   < > 27  ?BUN 23 17   < > 6*  ?CREATININE 1.68* 1.30*   < > 0.83  ?GLUCOSE 144* 134*   < > 156*  ?CALCIUM 8.4* 8.0*   < > 8.5*  ?MG  --   --    < > 1.7  ?PHOS 3.6 3.4  --  2.1*  ? < > = values in this interval not displayed.  ? ?Recent Labs  ?Lab 09/29/21 ?1207  ?AST 21  ?ALT 15  ?ALKPHOS 71  ?  ?Recent Labs  ?Lab 09/28/21 ?1650 09/29/21 ?1207 09/30/21 ?5643  ?WBC 6.2 6.0 4.7  ?HGB 13.2 12.0 10.5*  ?HCT 37.9 34.5* 30.3*  ?PLT 185.0 139* 122*  ? ?No results for input(s): APTT, INR in the last 168 hours. ? ?Imaging Studies  ?No new imaging studies to review ? ?GI Procedures and Studies  ?April 1 EGD ?- No gross lesions in esophagus proximally. ?- Malignant-appearing esophageal stenosis distally with what appears to  be an obstructing (only neonatal endoscope can pass), likely malignant esophageal tumor was found in the distal esophagus extending to the GE Jxn and the proximal cardia. Biopsied. ?- 8 cm hiatal hernia. ?- No gross lesions in the stomach. ?- No gross lesions in the duodenal bulb, in the first portion of the duodenum and in the ?second portion of the duodenum. ? ? ?ASSESSMENT  ?Ms. Arrazola is a 81 y.o. female with a pmh significant for CAD, atrial fibrillation (on Eliquis), aortic stenosis, CHF, previous esophageal adenocarcinoma status post ESD, large intrathoracic hiatal hernia, prior H. pylori infection.  Patient admitted with failure to thrive and dysphagia. ? ?The patient's clinical presentation is most consistent with likely recurrence of her esophageal adenocarcinoma with significant narrowing.  Biopsies are still pending and may  be back within the next 24 hours hopefully.  In any case, should her stricturing disease does not have many options.  She does not want to have percutaneous or surgical placed gastrostomy tube.  She does not want chemo or radiation or surgery if this is recurrent cancer.  I think enteral stenting may be a possible option for palliation though it is overall risk of perforation can be upwards of 10 to 15%.  She is not sure if she would want that if it did return and wants to focus on quality of life.  I have discussed with the patient and her niece that anything that is more than 5 mm in size is likely going to have problems with passage.  Focus should be on full liquids and protein shakes in the interim.  Try to get her strength back.  Unfortunately, if her electrolytes continue to have derangements, she could develop an arrhythmia and passed away from that.  It looks like the focus is either going to be palliative medicine to see the patient versus hospice with home capabilities.  The medical service will continue to work with the patient and family to decide what is next.  Enteral stenting may be an option if they decide that but often is for individuals who are in the hospital.  I will have our inpatient GI team follow-up tomorrow, if the patient remains in-house.  Otherwise I will send a message to the patient's primary gastroenterology team.  All patient questions were answered to the best of my ability, and the patient agrees to the aforementioned plan of action with follow-up as indicated. ? ? ? ?PLAN/RECOMMENDATIONS  ?Hopefully pathology will be back tomorrow ?Recommend phosphorus repletion as per primary service ?If patient feels up to a full liquid diet I think she can have that ?Ideally her apixaban would have been held for at least another 24 hours but it has already been restarted to monitor hemogram ?May continue Pepcid suspension ?IV PPI once daily for now and eventually transition to oral PPI  suspension (omeprazole can be done) ?Palliative medicine versus home with hospice discussions as per primary medical service ?Enteral stenting could be a possibility for palliation though carries risks as discussed above with family and patient ? ? ?Dr. Hilarie Fredrickson and Dr. Carlean Purl will take over the inpatient [place] [facility] service on 4/3.  In interim please page/call with questions or concerns. ? ? ?Justice Britain, MD ?[facility] ?Advanced Endoscopy ?Office # [phone] ? ? ?My discussion with the patient and patient's family took over 30 minutes in and of itself and overall time was greater than 40 minutes for this patient encounter. ? ? LOS: 3 days  ?[employer]  10/02/2021, 10:43 AM ? ?

## 2021-10-02 NOTE — TOC Transition Note (Signed)
Transition of Care (TOC) - CM/SW Discharge Note ? ? ?Patient Details  ?Name: [name] ?MRN: [id number] ?Date of Birth: [birth year] ? ?Transition of Care (TOC) CM/SW Contact:  ?Tawanna Cooler, RN ?Phone Number: ?10/02/2021, 3:11 PM ? ? ?Clinical Narrative:    ? ?Confirmed with Horris Latino at [facility] that they will be able to see patient tomorrow for intake.  Confirmed with son, [name], that patient has no equipment needs, and that they understand that hospice will start tomorrow.   ? ? ? ? ?Final next level of care: [place] ?Barriers to Discharge: Barriers Resolved ? ?

## 2021-10-02 NOTE — Progress Notes (Signed)
[facility]) Hospital Liaison note: ? ?This is a pending outpatient-based Palliative Care patient. Will continue to follow for disposition. ? ?Please call with any outpatient palliative questions or concerns. ? ?Thank you, ?Lorelee Market, LPN ?[facility] Liaison ?[phone] ?

## 2021-10-02 NOTE — Progress Notes (Signed)
Patient being discharged. Instructions reviewed with patient and her son [name]. Verbalized a full understanding of discharge instructions including new medications. [name] is patients ride home.  ?

## 2021-10-03 ENCOUNTER — Encounter (HOSPITAL_COMMUNITY): Payer: Self-pay | Admitting: Gastroenterology

## 2021-10-03 ENCOUNTER — Inpatient Hospital Stay: Admission: RE | Admit: 2021-10-03 | Payer: Medicare Other | Source: Ambulatory Visit

## 2021-10-03 NOTE — Progress Notes (Signed)
Addendum: ?Reviewed and agree with assessment and management plan. ?Pt was admitted to hospital and had EGD concerning for recurrent malignancy. ?See more recent GI documentation for details. ?Damion Kant, Lajuan Lines, MD ? ?

## 2021-10-04 LAB — SURGICAL PATHOLOGY

## 2021-10-05 ENCOUNTER — Encounter: Payer: Self-pay | Admitting: Gastroenterology

## 2021-10-07 ENCOUNTER — Ambulatory Visit (HOSPITAL_COMMUNITY): Admission: RE | Admit: 2021-10-07 | Payer: Medicare Other | Source: Ambulatory Visit

## 2021-10-10 ENCOUNTER — Telehealth: Payer: Self-pay | Admitting: *Deleted

## 2021-10-10 NOTE — Telephone Encounter (Signed)
? ?  Telephone encounter was:  Successful.  ?10/10/2021 ?Name: [name] MRN: [id number] DOB: 1941/03/22 ? ?[name] is a 81 y.o. year old female who is a primary care patient of Dorothyann Peng, NP . The community resource team was consulted for assistance with Transportation Needs  ? ?Care guide performed the following interventions: Patient is not interested in transportation or anything now she has been diagnosed with cancer and is under hospice care . ? ?Follow Up Plan:  No further follow up planned at this time. The patient has been provided with needed resources. ? ?Lovett Sox -Selinda Eon ?Care Guide , Embedded Care Coordination ?[place], Care Management  ?[phone] ?[address] , [place] [address] ?Email : [email or web address] ?  ? ?

## 2021-10-12 ENCOUNTER — Ambulatory Visit: Payer: Medicare Other | Admitting: Physician Assistant

## 2021-12-01 ENCOUNTER — Telehealth: Payer: Self-pay | Admitting: Adult Health

## 2021-12-01 NOTE — Telephone Encounter (Signed)
[name] wanted to call and let you know Ms Coulson has passed away, Tuesday 12-11-21.

## 2021-12-01 DEATH — deceased
